# Patient Record
Sex: Male | Born: 1945 | Race: White | Hispanic: No | Marital: Single | Smoking: Former smoker
Health system: Southern US, Community
[De-identification: ages and names within clinical notes are randomized; demographics above are authoritative.]

## PROBLEM LIST (undated history)

## (undated) DIAGNOSIS — E039 Hypothyroidism, unspecified: Secondary | ICD-10-CM

## (undated) DIAGNOSIS — I495 Sick sinus syndrome: Secondary | ICD-10-CM

## (undated) DIAGNOSIS — I10 Essential (primary) hypertension: Secondary | ICD-10-CM

## (undated) DIAGNOSIS — I255 Ischemic cardiomyopathy: Secondary | ICD-10-CM

## (undated) DIAGNOSIS — C801 Malignant (primary) neoplasm, unspecified: Secondary | ICD-10-CM

## (undated) DIAGNOSIS — N400 Enlarged prostate without lower urinary tract symptoms: Secondary | ICD-10-CM

## (undated) DIAGNOSIS — I517 Cardiomegaly: Secondary | ICD-10-CM

## (undated) DIAGNOSIS — Z7982 Long term (current) use of aspirin: Secondary | ICD-10-CM

## (undated) DIAGNOSIS — E785 Hyperlipidemia, unspecified: Secondary | ICD-10-CM

## (undated) DIAGNOSIS — Z95828 Presence of other vascular implants and grafts: Secondary | ICD-10-CM

## (undated) DIAGNOSIS — M47812 Spondylosis without myelopathy or radiculopathy, cervical region: Secondary | ICD-10-CM

## (undated) DIAGNOSIS — J387 Other diseases of larynx: Secondary | ICD-10-CM

## (undated) DIAGNOSIS — I447 Left bundle-branch block, unspecified: Secondary | ICD-10-CM

## (undated) DIAGNOSIS — I2699 Other pulmonary embolism without acute cor pulmonale: Secondary | ICD-10-CM

## (undated) DIAGNOSIS — D494 Neoplasm of unspecified behavior of bladder: Secondary | ICD-10-CM

## (undated) DIAGNOSIS — K4090 Unilateral inguinal hernia, without obstruction or gangrene, not specified as recurrent: Secondary | ICD-10-CM

## (undated) DIAGNOSIS — I700 Atherosclerosis of aorta: Secondary | ICD-10-CM

## (undated) DIAGNOSIS — Z951 Presence of aortocoronary bypass graft: Secondary | ICD-10-CM

## (undated) DIAGNOSIS — I219 Acute myocardial infarction, unspecified: Secondary | ICD-10-CM

## (undated) DIAGNOSIS — I6789 Other cerebrovascular disease: Secondary | ICD-10-CM

## (undated) DIAGNOSIS — B029 Zoster without complications: Secondary | ICD-10-CM

## (undated) DIAGNOSIS — I6529 Occlusion and stenosis of unspecified carotid artery: Secondary | ICD-10-CM

## (undated) DIAGNOSIS — I2510 Atherosclerotic heart disease of native coronary artery without angina pectoris: Secondary | ICD-10-CM

## (undated) DIAGNOSIS — C01 Malignant neoplasm of base of tongue: Secondary | ICD-10-CM

## (undated) DIAGNOSIS — N200 Calculus of kidney: Secondary | ICD-10-CM

## (undated) DIAGNOSIS — K219 Gastro-esophageal reflux disease without esophagitis: Secondary | ICD-10-CM

## (undated) DIAGNOSIS — I5020 Unspecified systolic (congestive) heart failure: Secondary | ICD-10-CM

## (undated) DIAGNOSIS — K760 Fatty (change of) liver, not elsewhere classified: Secondary | ICD-10-CM

## (undated) DIAGNOSIS — Z87891 Personal history of nicotine dependence: Secondary | ICD-10-CM

## (undated) DIAGNOSIS — R42 Dizziness and giddiness: Secondary | ICD-10-CM

## (undated) DIAGNOSIS — Z7901 Long term (current) use of anticoagulants: Secondary | ICD-10-CM

## (undated) DIAGNOSIS — M1990 Unspecified osteoarthritis, unspecified site: Secondary | ICD-10-CM

## (undated) DIAGNOSIS — K8020 Calculus of gallbladder without cholecystitis without obstruction: Secondary | ICD-10-CM

## (undated) DIAGNOSIS — D696 Thrombocytopenia, unspecified: Secondary | ICD-10-CM

## (undated) DIAGNOSIS — N183 Chronic kidney disease, stage 3 unspecified: Secondary | ICD-10-CM

## (undated) DIAGNOSIS — Z9842 Cataract extraction status, left eye: Secondary | ICD-10-CM

## (undated) DIAGNOSIS — Z9841 Cataract extraction status, right eye: Secondary | ICD-10-CM

## (undated) DIAGNOSIS — K4020 Bilateral inguinal hernia, without obstruction or gangrene, not specified as recurrent: Secondary | ICD-10-CM

## (undated) HISTORY — DX: Other pulmonary embolism without acute cor pulmonale: I26.99

## (undated) HISTORY — DX: Neoplasm of unspecified behavior of bladder: D49.4

## (undated) HISTORY — DX: Left bundle-branch block, unspecified: I44.7

## (undated) HISTORY — DX: Essential (primary) hypertension: I10

## (undated) HISTORY — DX: Ischemic cardiomyopathy: I25.5

## (undated) HISTORY — DX: Malignant neoplasm of base of tongue: C01

## (undated) HISTORY — DX: Hyperlipidemia, unspecified: E78.5

## (undated) HISTORY — DX: Atherosclerotic heart disease of native coronary artery without angina pectoris: I25.10

## (undated) HISTORY — DX: Unspecified systolic (congestive) heart failure: I50.20

## (undated) HISTORY — DX: Sick sinus syndrome: I49.5

## (undated) HISTORY — PX: CARDIAC CATHETERIZATION: SHX172

## (undated) HISTORY — PX: HERNIA REPAIR: SHX51

## (undated) MED FILL — Fosaprepitant Dimeglumine For IV Infusion 150 MG (Base Eq): INTRAVENOUS | Qty: 5 | Status: AC

---
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
## 2016-02-23 DIAGNOSIS — B029 Zoster without complications: Secondary | ICD-10-CM

## 2016-02-23 HISTORY — DX: Zoster without complications: B02.9

## 2017-02-22 DIAGNOSIS — I214 Non-ST elevation (NSTEMI) myocardial infarction: Secondary | ICD-10-CM

## 2017-02-22 HISTORY — DX: Non-ST elevation (NSTEMI) myocardial infarction: I21.4

## 2017-07-23 DIAGNOSIS — I219 Acute myocardial infarction, unspecified: Secondary | ICD-10-CM

## 2017-07-23 HISTORY — DX: Acute myocardial infarction, unspecified: I21.9

## 2017-08-27 ENCOUNTER — Encounter
Admission: EM | Disposition: A | Discharge status: Other Acute Inpatient Hospital | Payer: Self-pay | Source: Home / Self Care | Attending: Emergency Medicine

## 2017-08-27 ENCOUNTER — Inpatient Hospital Stay (HOSPITAL_COMMUNITY): Payer: Medicare Other

## 2017-08-27 ENCOUNTER — Inpatient Hospital Stay (HOSPITAL_COMMUNITY): Payer: Medicare Other | Admitting: Certified Registered"

## 2017-08-27 ENCOUNTER — Inpatient Hospital Stay (HOSPITAL_COMMUNITY)
Admission: EM | Admit: 2017-08-27 | Discharge: 2017-09-02 | DRG: 235 | Disposition: A | Discharge status: Skilled Nursing Facility | Payer: Medicare Other | Source: Other Acute Inpatient Hospital | Attending: Surgery | Admitting: Surgery

## 2017-08-27 ENCOUNTER — Encounter (HOSPITAL_COMMUNITY)
Admission: EM | Disposition: A | Discharge status: Skilled Nursing Facility | Payer: Self-pay | Source: Other Acute Inpatient Hospital | Attending: Surgery

## 2017-08-27 ENCOUNTER — Emergency Department
Admission: EM | Admit: 2017-08-27 | Discharge: 2017-08-27 | Disposition: A | Discharge status: Other Acute Inpatient Hospital | Payer: Medicare Other | Attending: Emergency Medicine | Admitting: Emergency Medicine

## 2017-08-27 DIAGNOSIS — Z01811 Encounter for preprocedural respiratory examination: Secondary | ICD-10-CM

## 2017-08-27 DIAGNOSIS — I2101 ST elevation (STEMI) myocardial infarction involving left main coronary artery: Secondary | ICD-10-CM | POA: Insufficient documentation

## 2017-08-27 DIAGNOSIS — Z951 Presence of aortocoronary bypass graft: Secondary | ICD-10-CM | POA: Diagnosis not present

## 2017-08-27 DIAGNOSIS — I213 ST elevation (STEMI) myocardial infarction of unspecified site: Secondary | ICD-10-CM

## 2017-08-27 DIAGNOSIS — Y840 Cardiac catheterization as the cause of abnormal reaction of the patient, or of later complication, without mention of misadventure at the time of the procedure: Secondary | ICD-10-CM | POA: Diagnosis not present

## 2017-08-27 DIAGNOSIS — D62 Acute posthemorrhagic anemia: Secondary | ICD-10-CM | POA: Diagnosis not present

## 2017-08-27 DIAGNOSIS — I447 Left bundle-branch block, unspecified: Secondary | ICD-10-CM | POA: Diagnosis present

## 2017-08-27 DIAGNOSIS — M6281 Muscle weakness (generalized): Secondary | ICD-10-CM | POA: Diagnosis not present

## 2017-08-27 DIAGNOSIS — Z87891 Personal history of nicotine dependence: Secondary | ICD-10-CM | POA: Diagnosis not present

## 2017-08-27 DIAGNOSIS — R262 Difficulty in walking, not elsewhere classified: Secondary | ICD-10-CM | POA: Diagnosis not present

## 2017-08-27 DIAGNOSIS — I25119 Atherosclerotic heart disease of native coronary artery with unspecified angina pectoris: Secondary | ICD-10-CM | POA: Diagnosis present

## 2017-08-27 DIAGNOSIS — R7303 Prediabetes: Secondary | ICD-10-CM | POA: Diagnosis present

## 2017-08-27 DIAGNOSIS — J9811 Atelectasis: Secondary | ICD-10-CM | POA: Diagnosis not present

## 2017-08-27 DIAGNOSIS — I2510 Atherosclerotic heart disease of native coronary artery without angina pectoris: Secondary | ICD-10-CM | POA: Diagnosis present

## 2017-08-27 DIAGNOSIS — I5041 Acute combined systolic (congestive) and diastolic (congestive) heart failure: Secondary | ICD-10-CM | POA: Diagnosis present

## 2017-08-27 DIAGNOSIS — I110 Hypertensive heart disease with heart failure: Secondary | ICD-10-CM | POA: Diagnosis present

## 2017-08-27 DIAGNOSIS — Z8249 Family history of ischemic heart disease and other diseases of the circulatory system: Secondary | ICD-10-CM | POA: Insufficient documentation

## 2017-08-27 DIAGNOSIS — I10 Essential (primary) hypertension: Secondary | ICD-10-CM | POA: Diagnosis not present

## 2017-08-27 DIAGNOSIS — Z743 Need for continuous supervision: Secondary | ICD-10-CM | POA: Diagnosis not present

## 2017-08-27 DIAGNOSIS — I214 Non-ST elevation (NSTEMI) myocardial infarction: Secondary | ICD-10-CM

## 2017-08-27 DIAGNOSIS — I25110 Atherosclerotic heart disease of native coronary artery with unstable angina pectoris: Secondary | ICD-10-CM | POA: Diagnosis not present

## 2017-08-27 DIAGNOSIS — I340 Nonrheumatic mitral (valve) insufficiency: Secondary | ICD-10-CM | POA: Diagnosis not present

## 2017-08-27 DIAGNOSIS — R0602 Shortness of breath: Secondary | ICD-10-CM | POA: Diagnosis not present

## 2017-08-27 DIAGNOSIS — E569 Vitamin deficiency, unspecified: Secondary | ICD-10-CM | POA: Diagnosis not present

## 2017-08-27 DIAGNOSIS — R0789 Other chest pain: Secondary | ICD-10-CM | POA: Diagnosis not present

## 2017-08-27 DIAGNOSIS — R279 Unspecified lack of coordination: Secondary | ICD-10-CM | POA: Diagnosis not present

## 2017-08-27 DIAGNOSIS — R079 Chest pain, unspecified: Secondary | ICD-10-CM

## 2017-08-27 DIAGNOSIS — Z5189 Encounter for other specified aftercare: Secondary | ICD-10-CM | POA: Diagnosis not present

## 2017-08-27 DIAGNOSIS — D696 Thrombocytopenia, unspecified: Secondary | ICD-10-CM | POA: Diagnosis not present

## 2017-08-27 DIAGNOSIS — E785 Hyperlipidemia, unspecified: Secondary | ICD-10-CM | POA: Diagnosis not present

## 2017-08-27 HISTORY — PX: CORONARY/GRAFT ACUTE MI REVASCULARIZATION: CATH118305

## 2017-08-27 HISTORY — DX: ST elevation (STEMI) myocardial infarction of unspecified site: I21.3

## 2017-08-27 HISTORY — PX: CORONARY ARTERY BYPASS GRAFT: SHX141

## 2017-08-27 HISTORY — PX: LEFT HEART CATH AND CORONARY ANGIOGRAPHY: CATH118249

## 2017-08-27 HISTORY — DX: Presence of aortocoronary bypass graft: Z95.1

## 2017-08-27 LAB — CBC WITH DIFFERENTIAL/PLATELET
BASOS PCT: 1 %
Basophils Absolute: 0.1 10*3/uL (ref 0–0.1)
EOS PCT: 2 %
Eosinophils Absolute: 0.2 10*3/uL (ref 0–0.7)
HCT: 50.7 % (ref 40.0–52.0)
Hemoglobin: 17.2 g/dL (ref 13.0–18.0)
LYMPHS PCT: 13 %
Lymphs Abs: 1.6 10*3/uL (ref 1.0–3.6)
MCH: 30.4 pg (ref 26.0–34.0)
MCHC: 33.9 g/dL (ref 32.0–36.0)
MCV: 89.5 fL (ref 80.0–100.0)
Monocytes Absolute: 0.8 10*3/uL (ref 0.2–1.0)
Monocytes Relative: 7 %
NEUTROS ABS: 9.9 10*3/uL — AB (ref 1.4–6.5)
Neutrophils Relative %: 79 %
PLATELETS: 216 10*3/uL (ref 150–440)
RBC: 5.66 MIL/uL (ref 4.40–5.90)
RDW: 13.7 % (ref 11.5–14.5)
WBC: 12.6 10*3/uL — AB (ref 3.8–10.6)

## 2017-08-27 LAB — POCT I-STAT 3, ART BLOOD GAS (G3+)
Acid-base deficit: 3 mmol/L — ABNORMAL HIGH (ref 0.0–2.0)
Bicarbonate: 21.3 mmol/L (ref 20.0–28.0)
O2 Saturation: 95 %
PCO2 ART: 35.8 mmHg (ref 32.0–48.0)
PH ART: 7.381 (ref 7.350–7.450)
Patient temperature: 98.3
TCO2: 22 mmol/L (ref 22–32)
pO2, Arterial: 75 mmHg — ABNORMAL LOW (ref 83.0–108.0)

## 2017-08-27 LAB — PROTIME-INR
INR: 0.97
Prothrombin Time: 12.8 seconds (ref 11.4–15.2)

## 2017-08-27 LAB — LIPID PANEL
CHOL/HDL RATIO: 6 ratio
CHOLESTEROL: 197 mg/dL (ref 0–200)
HDL: 33 mg/dL — ABNORMAL LOW (ref >40)
LDL Cholesterol: 138 mg/dL — ABNORMAL HIGH (ref 0–99)
TRIGLYCERIDES: 128 mg/dL (ref <150)
VLDL: 26 mg/dL (ref 0–40)

## 2017-08-27 LAB — COMPREHENSIVE METABOLIC PANEL
ALT: 25 U/L (ref 0–44)
ANION GAP: 8 (ref 5–15)
AST: 39 U/L (ref 15–41)
Albumin: 4.2 g/dL (ref 3.5–5.0)
Alkaline Phosphatase: 58 U/L (ref 38–126)
BUN: 17 mg/dL (ref 8–23)
CHLORIDE: 111 mmol/L (ref 98–111)
CO2: 24 mmol/L (ref 22–32)
CREATININE: 1.34 mg/dL — AB (ref 0.61–1.24)
Calcium: 9 mg/dL (ref 8.9–10.3)
GFR calc non Af Amer: 52 mL/min — ABNORMAL LOW (ref >60)
Glucose, Bld: 151 mg/dL — ABNORMAL HIGH (ref 70–99)
POTASSIUM: 3.9 mmol/L (ref 3.5–5.1)
SODIUM: 143 mmol/L (ref 135–145)
Total Bilirubin: 1 mg/dL (ref 0.3–1.2)
Total Protein: 7.3 g/dL (ref 6.5–8.1)

## 2017-08-27 LAB — SURGICAL PCR SCREEN
MRSA, PCR: NEGATIVE
STAPHYLOCOCCUS AUREUS: NEGATIVE

## 2017-08-27 LAB — ABO/RH: ABO/RH(D): A POS

## 2017-08-27 LAB — TYPE AND SCREEN
ABO/RH(D): A POS
Antibody Screen: NEGATIVE

## 2017-08-27 LAB — POCT ACTIVATED CLOTTING TIME: Activated Clotting Time: 202 seconds

## 2017-08-27 LAB — APTT: aPTT: 27 seconds (ref 24–36)

## 2017-08-27 LAB — TROPONIN I: TROPONIN I: 0.6 ng/mL — AB (ref <0.03)

## 2017-08-27 SURGERY — CORONARY ARTERY BYPASS GRAFTING (CABG)
Anesthesia: General | Site: Chest

## 2017-08-27 SURGERY — CORONARY/GRAFT ACUTE MI REVASCULARIZATION
Anesthesia: Moderate Sedation

## 2017-08-27 MED ORDER — HEPARIN (PORCINE) IN NACL 2-0.9 UNITS/ML: INTRAMUSCULAR | Status: DC | PRN

## 2017-08-27 MED ORDER — FENTANYL CITRATE (PF) 100 MCG/2ML IJ SOLN
INTRAMUSCULAR | Status: AC
  Filled 2017-08-27: qty 2

## 2017-08-27 MED ORDER — SODIUM CHLORIDE 0.9 % IV SOLN
1.5000 g | INTRAVENOUS | Status: AC
  Administered 2017-08-27: 1.5 g via INTRAVENOUS
  Filled 2017-08-27: qty 1.5

## 2017-08-27 MED ORDER — MAGNESIUM SULFATE 50 % IJ SOLN: 40.0000 meq | INTRAMUSCULAR | Status: DC

## 2017-08-27 MED ORDER — MIDAZOLAM HCL 5 MG/5ML IJ SOLN
INTRAMUSCULAR | Status: DC | PRN
  Administered 2017-08-27: 5 mg via INTRAVENOUS
  Administered 2017-08-27: 3 mg via INTRAVENOUS
  Administered 2017-08-27 – 2017-08-28 (×2): 2 mg via INTRAVENOUS

## 2017-08-27 MED ORDER — HEPARIN (PORCINE) IN NACL 100-0.45 UNIT/ML-% IJ SOLN
INTRAMUSCULAR | Status: AC
  Filled 2017-08-27: qty 250

## 2017-08-27 MED ORDER — HEPARIN (PORCINE) IN NACL 1000-0.9 UT/500ML-% IV SOLN
INTRAVENOUS | Status: AC
  Filled 2017-08-27: qty 1000

## 2017-08-27 MED ORDER — EPINEPHRINE PF 1 MG/ML IJ SOLN: 0.0000 ug/min | INTRAVENOUS | Status: DC

## 2017-08-27 MED ORDER — PROPOFOL 10 MG/ML IV BOLUS
INTRAVENOUS | Status: AC
  Filled 2017-08-27: qty 20

## 2017-08-27 MED ORDER — METOPROLOL TARTRATE 5 MG/5ML IV SOLN
INTRAVENOUS | Status: DC | PRN
  Administered 2017-08-27 (×5): 1 mg via INTRAVENOUS

## 2017-08-27 MED ORDER — SODIUM CHLORIDE 0.9 % IV SOLN: INTRAVENOUS | Status: DC

## 2017-08-27 MED ORDER — POTASSIUM CHLORIDE 2 MEQ/ML IV SOLN
80.0000 meq | INTRAVENOUS | Status: DC
  Filled 2017-08-27: qty 40

## 2017-08-27 MED ORDER — HEPARIN SODIUM (PORCINE) 1000 UNIT/ML IJ SOLN
INTRAMUSCULAR | Status: AC
  Filled 2017-08-27: qty 1

## 2017-08-27 MED ORDER — HEPARIN SODIUM (PORCINE) 5000 UNIT/ML IJ SOLN: 60.0000 [IU]/kg | Freq: Once | INTRAMUSCULAR | Status: DC

## 2017-08-27 MED ORDER — DOPAMINE-DEXTROSE 3.2-5 MG/ML-% IV SOLN
0.0000 ug/kg/min | INTRAVENOUS | Status: DC
  Filled 2017-08-27: qty 250

## 2017-08-27 MED ORDER — HEPARIN (PORCINE) IN NACL 100-0.45 UNIT/ML-% IJ SOLN
INTRAMUSCULAR | Status: AC | PRN
  Administered 2017-08-27: 1000 [IU]/h via INTRAVENOUS

## 2017-08-27 MED ORDER — VANCOMYCIN HCL 10 G IV SOLR
1250.0000 mg | INTRAVENOUS | Status: AC
  Administered 2017-08-27: 1250 mg via INTRAVENOUS
  Filled 2017-08-27: qty 1250

## 2017-08-27 MED ORDER — HEPARIN SODIUM (PORCINE) 1000 UNIT/ML IJ SOLN
INTRAMUSCULAR | Status: DC | PRN
  Administered 2017-08-27: 3000 [IU] via INTRAVENOUS
  Administered 2017-08-27: 6000 [IU] via INTRAVENOUS

## 2017-08-27 MED ORDER — MIDAZOLAM HCL 2 MG/2ML IJ SOLN
INTRAMUSCULAR | Status: AC
Start: 2017-08-27 — End: ?
  Filled 2017-08-27: qty 2

## 2017-08-27 MED ORDER — THROMBIN 20000 UNITS EX SOLR
CUTANEOUS | Status: AC
  Filled 2017-08-27: qty 20000

## 2017-08-27 MED ORDER — IOPAMIDOL (ISOVUE-300) INJECTION 61%
INTRAVENOUS | Status: DC | PRN
  Administered 2017-08-27: 165 mL

## 2017-08-27 MED ORDER — FENTANYL CITRATE (PF) 250 MCG/5ML IJ SOLN
INTRAMUSCULAR | Status: DC | PRN
  Administered 2017-08-27: 750 ug via INTRAVENOUS
  Administered 2017-08-27: 100 ug via INTRAVENOUS
  Administered 2017-08-27 (×3): 50 ug via INTRAVENOUS
  Administered 2017-08-27 (×2): 250 ug via INTRAVENOUS

## 2017-08-27 MED ORDER — ROCURONIUM BROMIDE 10 MG/ML (PF) SYRINGE
PREFILLED_SYRINGE | INTRAVENOUS | Status: DC | PRN
  Administered 2017-08-27 – 2017-08-28 (×2): 100 mg via INTRAVENOUS

## 2017-08-27 MED ORDER — SODIUM CHLORIDE 0.9 % IV SOLN
30.0000 ug/min | INTRAVENOUS | Status: AC
  Administered 2017-08-27: 50 ug/min via INTRAVENOUS
  Filled 2017-08-27: qty 2

## 2017-08-27 MED ORDER — ROCURONIUM BROMIDE 10 MG/ML (PF) SYRINGE
PREFILLED_SYRINGE | INTRAVENOUS | Status: AC
  Filled 2017-08-27: qty 20

## 2017-08-27 MED ORDER — EPINEPHRINE PF 1 MG/ML IJ SOLN
0.0000 ug/min | INTRAVENOUS | Status: DC
  Filled 2017-08-27: qty 4

## 2017-08-27 MED ORDER — NITROGLYCERIN IN D5W 200-5 MCG/ML-% IV SOLN: 2.0000 ug/min | INTRAVENOUS | Status: DC

## 2017-08-27 MED ORDER — HEMOSTATIC AGENTS (NO CHARGE) OPTIME
TOPICAL | Status: DC | PRN
  Administered 2017-08-27: 1 via TOPICAL

## 2017-08-27 MED ORDER — POTASSIUM CHLORIDE 2 MEQ/ML IV SOLN: 80.0000 meq | INTRAVENOUS | Status: DC

## 2017-08-27 MED ORDER — PROPOFOL 10 MG/ML IV BOLUS
INTRAVENOUS | Status: DC | PRN
  Administered 2017-08-27: 30 mg via INTRAVENOUS
  Administered 2017-08-27: 50 mg via INTRAVENOUS
  Administered 2017-08-27: 20 mg via INTRAVENOUS

## 2017-08-27 MED ORDER — VERAPAMIL HCL 2.5 MG/ML IV SOLN
INTRAVENOUS | Status: AC
  Filled 2017-08-27: qty 2

## 2017-08-27 MED ORDER — SODIUM CHLORIDE 0.9 % IV SOLN: 750.0000 mg | INTRAVENOUS | Status: DC

## 2017-08-27 MED ORDER — DOPAMINE-DEXTROSE 3.2-5 MG/ML-% IV SOLN: 0.0000 ug/kg/min | INTRAVENOUS | Status: DC

## 2017-08-27 MED ORDER — FENTANYL CITRATE (PF) 250 MCG/5ML IJ SOLN
INTRAMUSCULAR | Status: AC
  Filled 2017-08-27: qty 20

## 2017-08-27 MED ORDER — THROMBIN 20000 UNITS EX SOLR
CUTANEOUS | Status: DC | PRN
  Administered 2017-08-27: 20000 [IU] via TOPICAL

## 2017-08-27 MED ORDER — HEPARIN SODIUM (PORCINE) 5000 UNIT/ML IJ SOLN: 4000.0000 [IU] | Freq: Once | INTRAMUSCULAR | Status: DC

## 2017-08-27 MED ORDER — MIDAZOLAM HCL 2 MG/2ML IJ SOLN
INTRAMUSCULAR | Status: DC | PRN
  Administered 2017-08-27 (×2): 1 mg via INTRAVENOUS

## 2017-08-27 MED ORDER — DEXMEDETOMIDINE HCL IN NACL 400 MCG/100ML IV SOLN
0.1000 ug/kg/h | INTRAVENOUS | Status: AC
  Administered 2017-08-27: .2 ug/kg/h via INTRAVENOUS
  Filled 2017-08-27: qty 100

## 2017-08-27 MED ORDER — NITROGLYCERIN IN D5W 200-5 MCG/ML-% IV SOLN
2.0000 ug/min | INTRAVENOUS | Status: AC
  Administered 2017-08-27: 5 ug/min via INTRAVENOUS
  Filled 2017-08-27: qty 250

## 2017-08-27 MED ORDER — LACTATED RINGERS IV SOLN
INTRAVENOUS | Status: DC | PRN
  Administered 2017-08-27 (×2): via INTRAVENOUS

## 2017-08-27 MED ORDER — TRANEXAMIC ACID 1000 MG/10ML IV SOLN
1.5000 mg/kg/h | INTRAVENOUS | Status: AC
  Administered 2017-08-27: 1.5 mg/kg/h via INTRAVENOUS
  Filled 2017-08-27: qty 25

## 2017-08-27 MED ORDER — DEXMEDETOMIDINE HCL IN NACL 400 MCG/100ML IV SOLN: 0.1000 ug/kg/h | INTRAVENOUS | Status: DC

## 2017-08-27 MED ORDER — TRANEXAMIC ACID (OHS) BOLUS VIA INFUSION
15.0000 mg/kg | INTRAVENOUS | Status: AC
Start: 2017-08-27 — End: 2017-08-27
  Administered 2017-08-27: 1564.5 mg via INTRAVENOUS
  Filled 2017-08-27: qty 1565

## 2017-08-27 MED ORDER — TRANEXAMIC ACID 1000 MG/10ML IV SOLN: 1.5000 mg/kg/h | INTRAVENOUS | Status: DC

## 2017-08-27 MED ORDER — SODIUM CHLORIDE 0.9 % IV SOLN
750.0000 mg | INTRAVENOUS | Status: AC
  Administered 2017-08-28: 750 mg via INTRAVENOUS
  Filled 2017-08-27: qty 750

## 2017-08-27 MED ORDER — SUCCINYLCHOLINE CHLORIDE 20 MG/ML IJ SOLN
INTRAMUSCULAR | Status: DC | PRN
  Administered 2017-08-27: 100 mg via INTRAVENOUS

## 2017-08-27 MED ORDER — PHENYLEPHRINE 40 MCG/ML (10ML) SYRINGE FOR IV PUSH (FOR BLOOD PRESSURE SUPPORT)
PREFILLED_SYRINGE | INTRAVENOUS | Status: AC
  Filled 2017-08-27: qty 10

## 2017-08-27 MED ORDER — TRANEXAMIC ACID (OHS) PUMP PRIME SOLUTION: 2.0000 mg/kg | INTRAVENOUS | Status: DC

## 2017-08-27 MED ORDER — PLASMA-LYTE 148 IV SOLN: INTRAVENOUS | Status: DC

## 2017-08-27 MED ORDER — TRANEXAMIC ACID (OHS) BOLUS VIA INFUSION: 15.0000 mg/kg | INTRAVENOUS | Status: DC

## 2017-08-27 MED ORDER — 0.9 % SODIUM CHLORIDE (POUR BTL) OPTIME
TOPICAL | Status: DC | PRN
  Administered 2017-08-27: 7000 mL

## 2017-08-27 MED ORDER — ARTIFICIAL TEARS OPHTHALMIC OINT
TOPICAL_OINTMENT | OPHTHALMIC | Status: DC | PRN
  Administered 2017-08-27: 1 via OPHTHALMIC

## 2017-08-27 MED ORDER — LIDOCAINE HCL (PF) 1 % IJ SOLN
INTRAMUSCULAR | Status: AC
  Filled 2017-08-27: qty 30

## 2017-08-27 MED ORDER — SODIUM CHLORIDE 0.9 % IV SOLN: 30.0000 ug/min | INTRAVENOUS | Status: DC

## 2017-08-27 MED ORDER — SUCCINYLCHOLINE CHLORIDE 200 MG/10ML IV SOSY
PREFILLED_SYRINGE | INTRAVENOUS | Status: AC
  Filled 2017-08-27: qty 10

## 2017-08-27 MED ORDER — TRANEXAMIC ACID (OHS) PUMP PRIME SOLUTION
2.0000 mg/kg | INTRAVENOUS | Status: DC
  Filled 2017-08-27: qty 2.09

## 2017-08-27 MED ORDER — NITROGLYCERIN 5 MG/ML IV SOLN
INTRAVENOUS | Status: AC
  Filled 2017-08-27: qty 10

## 2017-08-27 MED ORDER — ASPIRIN 81 MG PO CHEW: 324.0000 mg | CHEWABLE_TABLET | Freq: Once | ORAL | Status: DC

## 2017-08-27 MED ORDER — LIDOCAINE HCL (PF) 1 % IJ SOLN
INTRAMUSCULAR | Status: DC | PRN
  Administered 2017-08-27: 45 mL via SUBCUTANEOUS

## 2017-08-27 MED ORDER — FENTANYL CITRATE (PF) 250 MCG/5ML IJ SOLN
INTRAMUSCULAR | Status: AC
  Filled 2017-08-27: qty 5

## 2017-08-27 MED ORDER — SODIUM CHLORIDE 0.9 % IV SOLN
INTRAVENOUS | Status: DC
  Filled 2017-08-27: qty 30

## 2017-08-27 MED ORDER — MAGNESIUM SULFATE 50 % IJ SOLN
40.0000 meq | INTRAMUSCULAR | Status: DC
  Filled 2017-08-27: qty 9.85

## 2017-08-27 MED ORDER — SODIUM CHLORIDE 0.9 % IV SOLN
INTRAVENOUS | Status: AC
  Administered 2017-08-27: 1.2 [IU]/h via INTRAVENOUS
  Filled 2017-08-27: qty 1

## 2017-08-27 MED ORDER — FENTANYL CITRATE (PF) 250 MCG/5ML IJ SOLN
INTRAMUSCULAR | Status: AC
Start: 2017-08-27 — End: ?
  Filled 2017-08-27: qty 5

## 2017-08-27 MED ORDER — HEPARIN SODIUM (PORCINE) 1000 UNIT/ML IJ SOLN
INTRAMUSCULAR | Status: DC | PRN
  Administered 2017-08-27: 35000 [IU] via INTRAVENOUS

## 2017-08-27 MED ORDER — MIDAZOLAM HCL 10 MG/2ML IJ SOLN
INTRAMUSCULAR | Status: AC
  Filled 2017-08-27: qty 2

## 2017-08-27 MED ORDER — PAPAVERINE HCL 30 MG/ML IJ SOLN
INTRAMUSCULAR | Status: AC
  Administered 2017-08-27: 500 mL
  Filled 2017-08-27: qty 2.5

## 2017-08-27 MED ORDER — FENTANYL CITRATE (PF) 100 MCG/2ML IJ SOLN
INTRAMUSCULAR | Status: DC | PRN
  Administered 2017-08-27: 50 ug via INTRAVENOUS

## 2017-08-27 SURGICAL SUPPLY — 22 items
BALLN TREK RX 2.5X15 (BALLOONS)
BALLOON TREK RX 2.5X15 (BALLOONS) IMPLANT
CANNULA 5F STIFF (CANNULA) ×3 IMPLANT
CATH IAB 7FR 40ML (CATHETERS) ×3 IMPLANT
CATH INFINITI 5FR ANG PIGTAIL (CATHETERS) IMPLANT
CATH INFINITI JR4 5F (CATHETERS) ×3 IMPLANT
CATH LAUNCHER 6FR EBU3.5 (CATHETERS) ×3 IMPLANT
DEVICE INFLAT 30 PLUS (MISCELLANEOUS) ×3 IMPLANT
DEVICE RAD TR BAND REGULAR (VASCULAR PRODUCTS) ×3 IMPLANT
DEVICE SECURE STATLOCK IABP (MISCELLANEOUS) ×6 IMPLANT
KIT MANI 3VAL PERCEP (MISCELLANEOUS) ×3 IMPLANT
KIT TRANSPAC II SGL 4260605 (MISCELLANEOUS) ×3 IMPLANT
NEEDLE PERC 18GX7CM (NEEDLE) ×3 IMPLANT
NEEDLE PERC 21GX4CM (NEEDLE) ×3 IMPLANT
PACK CARDIAC CATH (CUSTOM PROCEDURE TRAY) ×3 IMPLANT
SHEATH AVANTI 5FR X 11CM (SHEATH) ×3 IMPLANT
SHEATH RAIN RADIAL 21G 6FR (SHEATH) ×3 IMPLANT
SUT SILK 0 FSL (SUTURE) ×3 IMPLANT
WIRE GUIDERIGHT .035X150 (WIRE) ×3 IMPLANT
WIRE HI TORQ WHISPER MS 190CM (WIRE) ×3 IMPLANT
WIRE INTUITION PROPEL ST 180CM (WIRE) ×3 IMPLANT
WIRE ROSEN-J .035X260CM (WIRE) ×3 IMPLANT

## 2017-08-27 SURGICAL SUPPLY — 101 items
BAG DECANTER FOR FLEXI CONT (MISCELLANEOUS) ×3 IMPLANT
BANDAGE ACE 4X5 VEL STRL LF (GAUZE/BANDAGES/DRESSINGS) ×3 IMPLANT
BANDAGE ACE 6X5 VEL STRL LF (GAUZE/BANDAGES/DRESSINGS) ×3 IMPLANT
BASKET HEART  (ORDER IN 25'S) (MISCELLANEOUS) ×1
BASKET HEART (ORDER IN 25'S) (MISCELLANEOUS) ×1
BASKET HEART (ORDER IN 25S) (MISCELLANEOUS) ×1 IMPLANT
BLADE STERNUM SYSTEM 6 (BLADE) ×3 IMPLANT
BLADE SURG 11 STRL SS (BLADE) ×3 IMPLANT
BNDG GAUZE ELAST 4 BULKY (GAUZE/BANDAGES/DRESSINGS) ×3 IMPLANT
CANISTER SUCT 3000ML PPV (MISCELLANEOUS) ×3 IMPLANT
CATH ROBINSON RED A/P 18FR (CATHETERS) ×6 IMPLANT
CATH THORACIC 28FR (CATHETERS) ×3 IMPLANT
CATH THORACIC 36FR (CATHETERS) ×3 IMPLANT
CATH THORACIC 36FR RT ANG (CATHETERS) ×3 IMPLANT
CLIP VESOCCLUDE MED 24/CT (CLIP) IMPLANT
CLIP VESOCCLUDE SM WIDE 24/CT (CLIP) ×3 IMPLANT
CRADLE DONUT ADULT HEAD (MISCELLANEOUS) ×3 IMPLANT
DERMABOND ADVANCED (GAUZE/BANDAGES/DRESSINGS) ×2
DERMABOND ADVANCED .7 DNX12 (GAUZE/BANDAGES/DRESSINGS) ×1 IMPLANT
DRAPE CARDIOVASCULAR INCISE (DRAPES) ×2
DRAPE SLUSH/WARMER DISC (DRAPES) ×3 IMPLANT
DRAPE SRG 135X102X78XABS (DRAPES) ×1 IMPLANT
DRSG COVADERM 4X14 (GAUZE/BANDAGES/DRESSINGS) ×3 IMPLANT
ELECT CAUTERY BLADE 6.4 (BLADE) ×3 IMPLANT
ELECT REM PT RETURN 9FT ADLT (ELECTROSURGICAL) ×6
ELECTRODE REM PT RTRN 9FT ADLT (ELECTROSURGICAL) ×2 IMPLANT
FELT TEFLON 1X6 (MISCELLANEOUS) ×6 IMPLANT
GAUZE SPONGE 4X4 12PLY STRL (GAUZE/BANDAGES/DRESSINGS) ×6 IMPLANT
GAUZE SPONGE 4X4 12PLY STRL LF (GAUZE/BANDAGES/DRESSINGS) ×6 IMPLANT
GLOVE BIO SURGEON STRL SZ 6 (GLOVE) IMPLANT
GLOVE BIO SURGEON STRL SZ 6.5 (GLOVE) IMPLANT
GLOVE BIO SURGEON STRL SZ7 (GLOVE) IMPLANT
GLOVE BIO SURGEON STRL SZ7.5 (GLOVE) IMPLANT
GLOVE BIO SURGEONS STRL SZ 6.5 (GLOVE)
GLOVE BIOGEL PI IND STRL 6 (GLOVE) IMPLANT
GLOVE BIOGEL PI IND STRL 6.5 (GLOVE) IMPLANT
GLOVE BIOGEL PI IND STRL 7.0 (GLOVE) IMPLANT
GLOVE BIOGEL PI INDICATOR 6 (GLOVE)
GLOVE BIOGEL PI INDICATOR 6.5 (GLOVE)
GLOVE BIOGEL PI INDICATOR 7.0 (GLOVE)
GLOVE EUDERMIC 7 POWDERFREE (GLOVE) ×6 IMPLANT
GLOVE ORTHO TXT STRL SZ7.5 (GLOVE) IMPLANT
GOWN STRL REUS W/ TWL LRG LVL3 (GOWN DISPOSABLE) ×4 IMPLANT
GOWN STRL REUS W/ TWL XL LVL3 (GOWN DISPOSABLE) ×1 IMPLANT
GOWN STRL REUS W/TWL LRG LVL3 (GOWN DISPOSABLE) ×8
GOWN STRL REUS W/TWL XL LVL3 (GOWN DISPOSABLE) ×2
HEMOSTAT POWDER SURGIFOAM 1G (HEMOSTASIS) ×9 IMPLANT
HEMOSTAT SURGICEL 2X14 (HEMOSTASIS) ×3 IMPLANT
INSERT FOGARTY 61MM (MISCELLANEOUS) IMPLANT
INSERT FOGARTY XLG (MISCELLANEOUS) IMPLANT
KIT BASIN OR (CUSTOM PROCEDURE TRAY) ×3 IMPLANT
KIT CATH CPB BARTLE (MISCELLANEOUS) ×3 IMPLANT
KIT SUCTION CATH 14FR (SUCTIONS) ×3 IMPLANT
KIT TURNOVER KIT B (KITS) ×3 IMPLANT
KIT VASOVIEW HEMOPRO VH 3000 (KITS) ×3 IMPLANT
NS IRRIG 1000ML POUR BTL (IV SOLUTION) ×15 IMPLANT
PACK E OPEN HEART (SUTURE) ×3 IMPLANT
PACK OPEN HEART (CUSTOM PROCEDURE TRAY) ×3 IMPLANT
PAD ARMBOARD 7.5X6 YLW CONV (MISCELLANEOUS) ×6 IMPLANT
PAD ELECT DEFIB RADIOL ZOLL (MISCELLANEOUS) ×3 IMPLANT
PENCIL BUTTON HOLSTER BLD 10FT (ELECTRODE) ×3 IMPLANT
PUNCH AORTIC ROTATE 4.0MM (MISCELLANEOUS) IMPLANT
PUNCH AORTIC ROTATE 4.5MM 8IN (MISCELLANEOUS) ×3 IMPLANT
PUNCH AORTIC ROTATE 5MM 8IN (MISCELLANEOUS) IMPLANT
SET CARDIOPLEGIA MPS 5001102 (MISCELLANEOUS) ×3 IMPLANT
SPONGE INTESTINAL PEANUT (DISPOSABLE) IMPLANT
SPONGE LAP 18X18 X RAY DECT (DISPOSABLE) IMPLANT
SPONGE LAP 4X18 RFD (DISPOSABLE) ×3 IMPLANT
SUT BONE WAX W31G (SUTURE) ×3 IMPLANT
SUT MNCRL AB 4-0 PS2 18 (SUTURE) ×6 IMPLANT
SUT PROLENE 3 0 SH DA (SUTURE) ×9 IMPLANT
SUT PROLENE 3 0 SH1 36 (SUTURE) ×3 IMPLANT
SUT PROLENE 4 0 RB 1 (SUTURE)
SUT PROLENE 4 0 SH DA (SUTURE) IMPLANT
SUT PROLENE 4-0 RB1 .5 CRCL 36 (SUTURE) IMPLANT
SUT PROLENE 5 0 C 1 36 (SUTURE) IMPLANT
SUT PROLENE 6 0 C 1 30 (SUTURE) IMPLANT
SUT PROLENE 7 0 BV 1 (SUTURE) ×3 IMPLANT
SUT PROLENE 7 0 BV1 MDA (SUTURE) ×3 IMPLANT
SUT PROLENE 8 0 BV175 6 (SUTURE) IMPLANT
SUT SILK  1 MH (SUTURE)
SUT SILK 1 MH (SUTURE) IMPLANT
SUT STEEL STERNAL CCS#1 18IN (SUTURE) IMPLANT
SUT STEEL SZ 6 DBL 3X14 BALL (SUTURE) ×9 IMPLANT
SUT VIC AB 1 CTX 36 (SUTURE) ×6
SUT VIC AB 1 CTX36XBRD ANBCTR (SUTURE) ×3 IMPLANT
SUT VIC AB 2-0 CT1 27 (SUTURE) ×4
SUT VIC AB 2-0 CT1 TAPERPNT 27 (SUTURE) ×2 IMPLANT
SUT VIC AB 2-0 CTX 27 (SUTURE) IMPLANT
SUT VIC AB 3-0 SH 27 (SUTURE)
SUT VIC AB 3-0 SH 27X BRD (SUTURE) IMPLANT
SUT VIC AB 3-0 X1 27 (SUTURE) IMPLANT
SUT VICRYL 4-0 PS2 18IN ABS (SUTURE) IMPLANT
SYSTEM SAHARA CHEST DRAIN ATS (WOUND CARE) ×3 IMPLANT
TAPE CLOTH SURG 4X10 WHT LF (GAUZE/BANDAGES/DRESSINGS) ×3 IMPLANT
TOWEL GREEN STERILE (TOWEL DISPOSABLE) ×3 IMPLANT
TOWEL GREEN STERILE FF (TOWEL DISPOSABLE) ×3 IMPLANT
TRAY FOLEY SLVR 16FR TEMP STAT (SET/KITS/TRAYS/PACK) ×3 IMPLANT
TUBING INSUFFLATION (TUBING) ×3 IMPLANT
UNDERPAD 30X30 (UNDERPADS AND DIAPERS) ×3 IMPLANT
WATER STERILE IRR 1000ML POUR (IV SOLUTION) ×6 IMPLANT

## 2017-08-27 NOTE — ED Triage Notes (Addendum)
Per EMS report, Patient c/o chest pain for 2 hours. Patient c/o 9/10 chest pain. STEMI and LBBB noted on 12 Lead. Dr. Fletcher Anon at bedside when patient arrived.  Patient was given (4) 81mg  aspirin and 3 sprays of nitro prior to arrival. Patient is alert and oriented upon arrival.

## 2017-08-27 NOTE — Consult Note (Signed)
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 Sabino Gasser is an 72 y.o. male.  HPI:   Patient is a 72 year old gentleman with a strong family history of heart disease as well as history of remote smoking who presented to [facility] this afternoon with onset of persistent substernal chest pain beginning around 1:30 PM.  He reports having episodes of intermittent chest pain over the previous few months that have occurred with and without exertion and usually the last few minutes before they resolve.  The episode today was particularly severe and did not resolve.  Was associated with nausea, some shortness of breath, and a congested feeling.  He waited a little while to see if we get better but it got worse and he became extremely weak and called EMS.  Upon arrival his blood pressure was 200/120 and he was in mild respiratory distress and mildly hypoxic.  Letter cardiogram showed left bundle branch block of unknown duration.  Patient said that his chest pain improved by time he reached [facility] and was minimal by the time he got the Cath Lab.  Catheterization showed diffuse left main and calcified disease with an 80% stenosis.  This area looked hazy.  Proximal LAD was occluded.  The ostial to proximal left circumflex had 95% stenosis.  The right coronary was diffusely diseased with 60% proximal and 90% mid vessel stenosis.  The ostium of the PDA had about 80% stenosis.  There are collaterals from the right coronary artery to the LAD.  He had a slight puff left ventriculogram which is why there is reasonable systolic function.  Left ventricular end-diastolic pressure was 17 mmHg.  The LAD was probed with 2 wires but the occlusion could not be crossed most likely because was a chronic occlusion.  He  had an intra-aortic balloon pump placed in the right common femoral artery due to his anatomy and presentation with likely congestive heart failure and pulmonary edema.  The patient was transferred to [facility] for cardiac surgical evaluation.  No past medical history on file.  He denies any history of diabetes, hypertension, or hyperlipidemia.  He said that he has not been to the doctor in years and is only gone if he has had a problem.  His only prior surgery was a right inguinal hernia repair in the past.    Family Hx: He reports a family history of heart disease.  1 of his brothers had coronary artery bypass graft surgery in his 32s and died in his 59s.  Another brother had a ventricular septal defect as a child and had 3 heart surgeries for that.  Social History: He reports a remote history of smoking but quit in around 2000.  He denies alcohol abuse.  He has lived by himself for 30 to 18 years and is unmarried with no children.  He has a farm and takes care of goats, dogs, and chickens.  Allergies: No Known Allergies  Medications:  I have reviewed the patient's current medications. Prior to Admission:  No medications prior to admission.   Scheduled: . heparin-papaverine-plasmalyte irrigation   Irrigation To OR  . magnesium sulfate  40 mEq Other To OR  . potassium chloride  80 mEq Other  To OR  . tranexamic acid  15 mg/kg Intravenous To OR  . tranexamic acid  2 mg/kg Intracatheter To OR   Continuous: . cefUROXime (ZINACEF)  IV    . cefUROXime (ZINACEF)  IV    . dexmedetomidine    . DOPamine    . epinephrine    . heparin 30,000 units/NS 1000 mL solution for CELLSAVER    . insulin (NOVOLIN-R) infusion    . nitroGLYCERIN    . phenylephrine 6m/250mL NS (0.080mml) infusion    . tranexamic acid (CYKLOKAPRON) infusion ([facility])    . vancomycin     PRN: Anti-infectives (From admission, onward)   Start     Dose/Rate Route Frequency Ordered Stop   08/28/17 0400  cefUROXime (ZINACEF)  750 mg in sodium chloride 0.9 % 100 mL IVPB  Status:  Discontinued     750 mg 200 mL/hr over 30 Minutes Intravenous To Surgery 08/27/17 2018 08/27/17 2021   08/27/17 2045  vancomycin (VANCOCIN) 1,250 mg in sodium chloride 0.9 % 250 mL IVPB     1,250 mg 166.7 mL/hr over 90 Minutes Intravenous To Surgery 08/27/17 2031 08/28/17 2045   08/27/17 2045  cefUROXime (ZINACEF) 1.5 g in sodium chloride 0.9 % 100 mL IVPB     1.5 g 200 mL/hr over 30 Minutes Intravenous To Surgery 08/27/17 2031 08/28/17 2045   08/27/17 2045  cefUROXime (ZINACEF) 750 mg in sodium chloride 0.9 % 100 mL IVPB     750 mg 200 mL/hr over 30 Minutes Intravenous To Surgery 08/27/17 2031 08/28/17 2045      Results for orders placed or performed during the hospital encounter of 08/27/17 (from the past 48 hour(s))  CBC with Differential/Platelet     Status: Abnormal   Collection Time: 08/27/17  4:42 PM  Result Value Ref Range   WBC 12.6 (H) 3.8 - 10.6 K/uL   RBC 5.66 4.40 - 5.90 MIL/uL   Hemoglobin 17.2 13.0 - 18.0 g/dL   HCT 50.7 40.0 - 52.0 %   MCV 89.5 80.0 - 100.0 fL   MCH 30.4 26.0 - 34.0 pg   MCHC 33.9 32.0 - 36.0 g/dL   RDW 13.7 11.5 - 14.5 %   Platelets 216 150 - 440 K/uL   Neutrophils Relative % 79 %   Neutro Abs 9.9 (H) 1.4 - 6.5 K/uL   Lymphocytes Relative 13 %   Lymphs Abs 1.6 1.0 - 3.6 K/uL   Monocytes Relative 7 %   Monocytes Absolute 0.8 0.2 - 1.0 K/uL   Eosinophils Relative 2 %   Eosinophils Absolute 0.2 0 - 0.7 K/uL   Basophils Relative 1 %   Basophils Absolute 0.1 0 - 0.1 K/uL    Comment: Performed at [facility] [place] [id number]-INR     Status: None   Collection Time: 08/27/17  4:42 PM  Result Value Ref Range   Prothrombin Time 12.8 11.4 - 15.2 seconds   INR 0.97     Comment: Performed at [facility] [id number]     Status: None   Collection Time: 08/27/17  4:42 PM  Result Value Ref Range   aPTT 27  24 - 36 seconds    Comment: Performed at [facility] [place] [id number] metabolic panel     Status: Abnormal   Collection Time: 08/27/17  4:42 PM  Result Value Ref Range   Sodium  143 135 - 145 mmol/L   Potassium 3.9 3.5 - 5.1 mmol/L   Chloride 111 98 - 111 mmol/L    Comment: Please note change in reference range.   CO2 24 22 - 32 mmol/L   Glucose, Bld 151 (H) 70 - 99 mg/dL    Comment: Please note change in reference range.   BUN 17 8 - 23 mg/dL    Comment: Please note change in reference range.   Creatinine, Ser 1.34 (H) 0.61 - 1.24 mg/dL   Calcium 9.0 8.9 - 10.3 mg/dL   Total Protein 7.3 6.5 - 8.1 g/dL   Albumin 4.2 3.5 - 5.0 g/dL   AST 39 15 - 41 U/L   ALT 25 0 - 44 U/L    Comment: Please note change in reference range.   Alkaline Phosphatase 58 38 - 126 U/L   Total Bilirubin 1.0 0.3 - 1.2 mg/dL   GFR calc non Af Amer 52 (L) >60 mL/min   GFR calc Af Amer >60 >60 mL/min    Comment: (NOTE) The eGFR has been calculated using the CKD EPI equation. This calculation has not been validated in all clinical situations. eGFR's persistently <60 mL/min signify possible Chronic Kidney Disease.    Anion gap 8 5 - 15    Comment: Performed at [facility], [place], [place] [address]  Troponin I     Status: Abnormal   Collection Time: 08/27/17  4:42 PM  Result Value Ref Range   Troponin I 0.60 (HH) <0.03 ng/mL    Comment: CRITICAL RESULT CALLED TO, READ BACK BY AND VERIFIED WITH ALLY WRIGHT  AT  1723 ON 08/27/17 BY SNJ. Performed at [facility], [address] [place], [place] [address]   Lipid panel     Status: Abnormal   Collection Time: 08/27/17  4:42 PM  Result Value Ref Range   Cholesterol 197 0 - 200 mg/dL   Triglycerides 128 <150 mg/dL   HDL 33 (L) >40 mg/dL   Total CHOL/HDL Ratio 6.0 RATIO   VLDL 26 0 - 40 mg/dL   LDL Cholesterol 138 (H) 0 - 99 mg/dL    Comment:        Total  Cholesterol/HDL:CHD Risk Coronary Heart Disease Risk Table                     Men   Women  1/2 Average Risk   3.4   3.3  Average Risk       5.0   4.4  2 X Average Risk   9.6   7.1  3 X Average Risk  23.4   11.0        Use the calculated Patient Ratio above and the CHD Risk Table to determine the patient's CHD Risk.        ATP [name] CLASSIFICATION (LDL):  <100     mg/dL   Optimal  100-129  mg/dL   Near or Above                    Optimal  130-159  mg/dL   Borderline  160-189  mg/dL   High  >190     mg/dL   Very High Performed at [facility], [address], [place], [place] [address]   POCT Activated clotting time     Status: None   Collection Time: 08/27/17  5:36 PM  Result Value Ref Range   Activated Clotting Time 202  seconds    No results found.  Review of Systems  Constitutional: Positive for malaise/fatigue.  HENT: Negative.   Eyes: Negative.   Respiratory: Positive for cough and shortness of breath. Negative for sputum production.   Cardiovascular: Positive for chest pain and orthopnea. Negative for leg swelling and PND.  Gastrointestinal: Positive for nausea and vomiting. Negative for abdominal pain.  Genitourinary: Negative.   Musculoskeletal: Negative.   Skin: Negative.   Neurological: Negative for dizziness and loss of consciousness.  Endo/Heme/Allergies: Negative.   Psychiatric/Behavioral: Negative.    Pulse (!) 118, resp. rate 20, SpO2 96 %. Physical Exam  Constitutional: He is oriented to person, place, and time. He appears well-developed and well-nourished. No distress.  HENT:  Head: Normocephalic and atraumatic.  Mouth/Throat: Oropharynx is clear and moist.  Poor dentition with multiple missing teeth  Eyes: Pupils are equal, round, and reactive to light. Conjunctivae and EOM are normal.  Neck: Normal range of motion. Neck supple. No JVD present. No thyromegaly present.  Cardiovascular: Normal rate, regular rhythm, normal heart sounds and  intact distal pulses.  No murmur heard. IABP right groin with some bleeding around it. No hematoma.  Respiratory: Effort normal. No respiratory distress. He has no wheezes. He has no rales.  GI: Soft. Bowel sounds are normal. He exhibits no distension and no mass. There is no tenderness.  Musculoskeletal: Normal range of motion. He exhibits no edema.  Lymphadenopathy:    He has no cervical adenopathy.  Neurological: He is alert and oriented to person, place, and time. No cranial nerve deficit.  Skin: Skin is warm and dry.  Psychiatric: He has a normal mood and affect.    Assessment/Plan:  This 72 year old gentleman has severe left main and three-vessel coronary disease presenting with an ST segment elevation MI with left bundle branch block on EKG.  His initial troponin was 0.60.  He has been having chest pain off and on for the past few months.  He is currently hemodynamically stable with no chest pain on intravenous heparin and the intra-aortic balloon pump.  There is some bleeding around the balloon pump exit site and his heparin will need to be stopped at least temporarily.  I think the best treatment for this patient is emergent coronary bypass graft surgery while he is doing well given the severity of his coronary anatomy and presentation. I discussed the operative procedure with the patient and family including alternatives, benefits and risks; including but not limited to bleeding, blood transfusion, infection, stroke, myocardial infarction, graft failure, heart block requiring a permanent pacemaker, organ dysfunction, and death.  Sharin Mons understands and agrees to proceed.     I spent 60 minutes performing this consultation and > 50% of this time was spent face to face counseling and coordinating the care of this patient's severe left main and three-vessel coronary disease with ST segment elevation MI.   Gaye Pollack 08/27/2017, 8:28 PM

## 2017-08-27 NOTE — ED Notes (Signed)
Patient put back on NRB on which he had arrived to the ED. Patient remains alert and oriented and verbal.

## 2017-08-27 NOTE — Progress Notes (Signed)
TR band still in place in route to surgery. 2 cc still present. Pt did still have some oozing from the site.

## 2017-08-27 NOTE — Anesthesia Procedure Notes (Signed)
Central Venous Catheter Insertion Performed by: Roderic Palau, MD, anesthesiologist Start/End7/07/2017 9:15 PM, 08/27/2017 9:30 PM Patient location: Pre-op. Preanesthetic checklist: patient identified, IV checked, site marked, risks and benefits discussed, surgical consent, monitors and equipment checked, pre-op evaluation, timeout performed and anesthesia consent Hand hygiene performed  and maximum sterile barriers used  PA cath was placed.Swan type:thermodilution PA Cath depth:50 Procedure performed without using ultrasound guided technique. Attempts: 1 Patient tolerated the procedure well with no immediate complications.

## 2017-08-27 NOTE — Progress Notes (Signed)
   08/27/17 1608  Clinical Encounter Type  Visited With Other (Comment) (unit staff)  Visit Type Other (Comment) (telephone response to page)   Received page from EMS that Code STEMI is en route, right after received another page regarding death in CCU.  Chaplain called ED to relay chaplain plan to check in on CCU before proceeding to ED.

## 2017-08-27 NOTE — ED Notes (Addendum)
Clothes, wallet and keys with patient to the cath lab.

## 2017-08-27 NOTE — Progress Notes (Signed)
   08/27/17 1630  Clinical Encounter Type  Visited With Patient not available;Health care provider  Visit Type Follow-up  Spiritual Encounters  Spiritual Needs Prayer   Chaplain proceeded to ED to follow up on earlier page, checked in with unit staff to confirm room number.   Chaplain maintained pastoral presence outside room as team worked, offering silent and energetic prayers for patient and care team.  Per staff and EMS, no family accompanied patient.  Team took patient to Cath Lab.

## 2017-08-27 NOTE — ED Notes (Signed)
Jamelle Haring RN, Ashely R RN, Delilah Shan RN, [place] ED tech, [place] ED tech and Theadora Rama, ED charge RN at bedside.

## 2017-08-27 NOTE — ED Provider Notes (Signed)
[facility] Emergency Department Provider Note ____________________________________________   First MD Initiated Contact with Patient 08/27/17 1644     (approximate)  I have reviewed the triage vital signs and the nursing notes.   HISTORY  Chief Complaint Chest Pain    HPI Cory Hess is a 72 y.o. male  with no known past medical history (patient states he has not been to doctor in a while and does not take any medications) presents with chest pain, acute onset 2 hours ago, pressure-like, and associated with shortness of breath.  He was given aspirin and nitroglycerin by EMS, but had no significant relief.  No prior history of symptoms like this.  EMS called the patient in as a STEMI.  No past medical history on file.  There are no active problems to display for this patient.   Prior to Admission medications   Not on File    Allergies Patient has no known allergies.  No family history on file.  Social History Social History   Tobacco Use  . Smoking status: Not on file  Substance Use Topics  . Alcohol use: Not on file  . Drug use: Not on file    Review of Systems  Constitutional: No fever. Eyes: No redness. ENT: No neck pain. Cardiovascular: Positive for chest pain. Respiratory: Positive for shortness of breath. Gastrointestinal: No vomiting. Genitourinary: Negative for flank pain.  Musculoskeletal: Negative for back pain. Skin: Negative for rash. Neurological: Negative for headache.   ____________________________________________   PHYSICAL EXAM:  VITAL SIGNS: ED Triage Vitals  Enc Vitals Group     BP      Pulse      Resp      Temp      Temp src      SpO2      Weight      Height      Head Circumference      Peak Flow      Pain Score      Pain Loc      Pain Edu?      Excl. in [place]?     Constitutional: Alert and oriented.  Uncomfortable appearing. Eyes: Conjunctivae are normal.  Head: Atraumatic. Nose: No  congestion/rhinnorhea. Mouth/Throat: Mucous membranes are moist.   Neck: Normal range of motion.  Cardiovascular: Tachycardic, regular rhythm. Grossly normal heart sounds.  Good peripheral circulation. Respiratory: Increased respiratory effort.  No retractions.  Rales bilaterally. Gastrointestinal: Soft and nontender. No distention.  Genitourinary: No flank tenderness. Musculoskeletal:  Extremities warm and well perfused.  Neurologic:  Normal speech and language. No gross focal neurologic deficits are appreciated.  Skin:  Skin is warm and dry. No rash noted. Psychiatric: Mood and affect are normal. Speech and behavior are normal.  ____________________________________________   LABS (all labs ordered are listed, but only abnormal results are displayed)  Labs Reviewed  CBC WITH DIFFERENTIAL/PLATELET  PROTIME-INR  APTT  COMPREHENSIVE METABOLIC PANEL  TROPONIN I  LIPID PANEL   ____________________________________________  EKG  ED ECG REPORT I, Arta Silence, the attending physician, personally viewed and interpreted this ECG.  Date: 08/27/2017 EKG Time: 1639 Rate: 121 Rhythm: Sinus tachycardia QRS Axis: normal Intervals: LBBB ST/T Wave abnormalities: Nonspecific Narrative Interpretation: LBBB with no prior EKG for comparison  ____________________________________________  RADIOLOGY    ____________________________________________   PROCEDURES  Procedure(s) performed: No  Procedures  Critical Care performed: Yes  CRITICAL CARE Performed by: Arta Silence   Total critical care time: 15 minutes  Critical  care time was exclusive of separately billable procedures and treating other patients.  Critical care was necessary to treat or prevent imminent or life-threatening deterioration.  Critical care was time spent personally by me on the following activities: development of treatment plan with patient and/or surrogate as well as nursing, discussions  with consultants, evaluation of patient's response to treatment, examination of patient, obtaining history from patient or surrogate, ordering and performing treatments and interventions, ordering and review of laboratory studies, ordering and review of radiographic studies, pulse oximetry and re-evaluation of patient's condition. ____________________________________________   INITIAL IMPRESSION / ASSESSMENT AND PLAN / ED COURSE  Pertinent labs & imaging results that were available during my care of the patient were reviewed by me and considered in my medical decision making (see chart for details).  72 year old male with no known significant past medical history presents with acute onset of chest pain shortness of breath 2 hours ago.  STEMI was activated by EMS due to left bundle branch block and the patient's clinical presentation.  On arrival, the patient is pale and weak appearing.  He has rales to the upper lung fields bilaterally, and is tachycardic.  EKG here confirms LBBB.  There is no prior EKG available for comparison.  Presentation is consistent with acute pulmonary edema.  Dr. Fletcher Anon met the patient with me at the bedside.  He is concern for acute MI and would like to take the patient to the Cath Lab.  Patient consents to catheterization.  Aspirin and nitro given by EMS.  Heparin given in the ED.  The patient will be taken to the Cath Lab by Dr. Fletcher Anon..   ____________________________________________   FINAL CLINICAL IMPRESSION(S) / ED DIAGNOSES  Final diagnoses:  Chest pain, unspecified type  Left bundle branch block      NEW MEDICATIONS STARTED DURING THIS VISIT:  New Prescriptions   No medications on file     Note:  This document was prepared using Dragon voice recognition software and may include unintentional dictation errors.   Arta Silence, MD 08/27/17 [phone]

## 2017-08-27 NOTE — Progress Notes (Signed)
  Echocardiogram Echocardiogram Transesophageal has been performed.  Cory Hess 08/27/2017, 9:51 PM

## 2017-08-27 NOTE — ED Notes (Signed)
Patient was given 4000 units heparin IV by Delilah Shan RN

## 2017-08-27 NOTE — Progress Notes (Addendum)
Pt has oozing from rt femoral site where balloon pump placed. Cipriano Mile, RN held pressure for 30 minutes with hemostasis. Dressing was placed per protocol. No c/o lower back pain. Bruise noted to the rt lower abdominal area. MD made aware and order to stop heparin given. Site is currently a level 0.

## 2017-08-27 NOTE — Anesthesia Procedure Notes (Signed)
Central Venous Catheter Insertion Performed by: Roderic Palau, MD, anesthesiologist Start/End7/07/2017 9:15 PM, 08/27/2017 9:30 PM Patient location: Pre-op. Preanesthetic checklist: patient identified, IV checked, site marked, risks and benefits discussed, surgical consent, monitors and equipment checked, pre-op evaluation, timeout performed and anesthesia consent Position: Trendelenburg Lidocaine 1% used for infiltration and patient sedated Hand hygiene performed , maximum sterile barriers used  and Seldinger technique used Catheter size: 9 Fr Total catheter length 10. Central line was placed.MAC introducer Procedure performed using ultrasound guided technique. Ultrasound Notes:anatomy identified, needle tip was noted to be adjacent to the nerve/plexus identified, no ultrasound evidence of intravascular and/or intraneural injection and image(s) printed for medical record Attempts: 1 Following insertion, line sutured, dressing applied and Biopatch. Post procedure assessment: blood return through all ports, free fluid flow and no air  Patient tolerated the procedure well with no immediate complications.

## 2017-08-27 NOTE — ED Notes (Signed)
Patient transported to cath lab

## 2017-08-27 NOTE — Anesthesia Procedure Notes (Signed)
Arterial Line Insertion Start/End7/07/2017 9:15 PM, 08/27/2017 9:20 PM Performed by: Roderic Palau, MD, Krist Rosenboom, Kathrin Penner, CRNA, CRNA  Preanesthetic checklist: patient identified, IV checked, risks and benefits discussed, surgical consent, monitors and equipment checked and pre-op evaluation Lidocaine 1% used for infiltration Left, radial was placed Catheter size: 20 G Hand hygiene performed  and maximum sterile barriers used   Attempts: 1 Procedure performed without using ultrasound guided technique. Ultrasound Notes:anatomy identified Following insertion, dressing applied and Biopatch.

## 2017-08-27 NOTE — Anesthesia Procedure Notes (Addendum)
Procedure Name: Intubation Date/Time: 08/27/2017 9:34 PM Performed by: Babs Bertin, CRNA Pre-anesthesia Checklist: Patient identified, Emergency Drugs available, Suction available and Patient being monitored Patient Re-evaluated:Patient Re-evaluated prior to induction Oxygen Delivery Method: Circle System Utilized Preoxygenation: Pre-oxygenation with 100% oxygen Induction Type: IV induction and Rapid sequence Laryngoscope Size: Mac and 4 Grade View: Grade I Tube type: Subglottic suction tube Tube size: 8.0 mm Number of attempts: 1 Airway Equipment and Method: Stylet and Oral airway Placement Confirmation: ETT inserted through vocal cords under direct vision,  positive ETCO2 and breath sounds checked- equal and bilateral Secured at: 22 cm Tube secured with: Tape Dental Injury: Teeth and Oropharynx as per pre-operative assessment

## 2017-08-27 NOTE — H&P (Addendum)
Cardiology Admission History and Physical:   Patient ID: [employer]elle [employer]; MRN: [id number]; DOB: [birth year]   Admission date: 08/27/2017  Primary Care Provider: No primary care provider on file. Primary Cardiologist: New   Chief Complaint: Chest pain and shortness of breath  Patient Profile:   Cory Hess is a 72 y.o. male with no prior medical history and does not take any medications who presented with acute onset of chest pain or shortness of breath.  History of Present Illness:   Cory Hess is a 72 year old male with no prior cardiac history.  He reports that he does not see a primary care physician and is not aware of any medical problems.  He does not take any medications other than a multivitamin.  He lives by himself.  He does not smoke according to him. He reports intermittent episodes of exertional chest pain over the last few months.  However, he had an acute onset of severe substernal chest pain 2 hours before presentation with severe shortness of breath.  He describes severe substernal chest tightness.  He felt extremely weak and called EMS.  Upon arrival, his blood pressure was 200/120 mmHg and the patient was in mild respiratory distress and mildly hypoxic.  Blood pressure improved with nitroglycerin and hypoxia improved with oxygen.  His EKG showed left bundle branch block of unknown chronicity given that there is no previous EKG to compare with. The patient was still having significant chest discomfort and due to that I recommended proceeding with emergent cardiac catheterization and possible PCI.   No past medical history on file.     Medications Prior to Admission: Prior to Admission medications   Not on File     Allergies:   No Known Allergies  Social History:   Social History   Socioeconomic History  . Marital status: Not on file    Spouse name: Not on file  . Number of children: Not on file  . Years of education: Not on file  . Highest education level: Not  on file  Occupational History  . Not on file  Social Needs  . Financial resource strain: Not on file  . Food insecurity:    Worry: Not on file    Inability: Not on file  . Transportation needs:    Medical: Not on file    Non-medical: Not on file  Tobacco Use  . Smoking status: Not on file  Substance and Sexual Activity  . Alcohol use: Not on file  . Drug use: Not on file  . Sexual activity: Not on file  Lifestyle  . Physical activity:    Days per week: Not on file    Minutes per session: Not on file  . Stress: Not on file  Relationships  . Social connections:    Talks on phone: Not on file    Gets together: Not on file    Attends religious service: Not on file    Active member of club or organization: Not on file    Attends meetings of clubs or organizations: Not on file    Relationship status: Not on file  . Intimate partner violence:    Fear of current or ex partner: Not on file    Emotionally abused: Not on file    Physically abused: Not on file    Forced sexual activity: Not on file  Other Topics Concern  . Not on file  Social History Narrative  . Not on file     Family Hx:  He reports a family history of heart disease.  1 of his brothers had CABG in his 61s and died in his 69s.  Another brother had a ventricular septal defect as a child and had 3 heart surgeries for that.  Social History: He reports a remote history of smoking but quit in around 2000.  He denies alcohol abuse.  He has lived by himself for 30 to 47 years and is unmarried with no children.  He has a farm and takes care of goats, dogs, and chickens.   ROS:  Please see the history of present illness.  All other ROS reviewed and negative.     Physical Exam/Data:   Vitals:   08/27/17 1638 08/27/17 1641 08/27/17 1645 08/27/17 1656  BP: 125/79 114/87 116/85   Pulse: (!) 120 (!) 123 (!) 118   Resp: (!) 23 19 (!) 27   SpO2: (!) 88% (!) 88%    Weight:    229 lb 15 oz (104.3 kg)  Height:    5\' 10"   (1.778 m)   No intake or output data in the 24 hours ending 08/27/17 1837 Filed Weights   08/27/17 1656  Weight: 229 lb 15 oz (104.3 kg)   Body mass index is 32.99 kg/m.  General:  Well nourished, well developed, in mild respiratory distress HEENT: normal Lymph: no adenopathy Neck: Mild Endocrine:  No thryomegaly Vascular: No carotid bruits; FA pulses 2+ bilaterally without bruits  Cardiac:  normal S1, S2; RRR; no murmur.  The patient is tachycardic Lungs: Bilateral rales Abd: soft, nontender, no hepatomegaly  Ext: no edema Musculoskeletal:  No deformities, BUE and BLE strength normal and equal Skin: warm and dry  Neuro:  CNs 2-12 intact, no focal abnormalities noted Psych:  Normal affect    EKG:  The ECG that was done  was personally reviewed and demonstrates sinus tachycardia with left bundle branch block  Relevant CV Studies: Emergent cardiac catheterization: 1.  Suspected ST elevation myocardial infarction with presumed new left bundle branch block.  Severe left main stenosis which appears hazy suggestive of plaque rupture with chronic occlusion of mid LAD with collaterals from the right coronary artery, severe ostial left circumflex stenosis and severe RCA stenosis. 2.  Mildly elevated left ventricular end-diastolic pressure  at 17 mmHg.   3.  The LAD was probed with 2 wires and could not cross the occlusion which favors that this is a chronic occlusion. 4.  Successful intra-aortic balloon pump placement via the right common femoral artery.  Recommendations: I recommend urgent surgical revascularization. Recommend obtaining a stat echocardiogram upon arrival or the patient can have a transesophageal echocardiogram in the OR. The patient has not been given any antiplatelet medications other than aspirin.  continue heparin drip for intra-aortic balloon pump. I disucssed the case with Dr. Cyndia Bent and Dr. Burt Knack.   Laboratory Data:  Chemistry Recent Labs  Lab  08/27/17 1642  NA 143  K 3.9  CL 111  CO2 24  GLUCOSE 151*  BUN 17  CREATININE 1.34*  CALCIUM 9.0  GFRNONAA 52*  GFRAA >60  ANIONGAP 8    Recent Labs  Lab 08/27/17 1642  PROT 7.3  ALBUMIN 4.2  AST 39  ALT 25  ALKPHOS 58  BILITOT 1.0   Hematology Recent Labs  Lab 08/27/17 1642  WBC 12.6*  RBC 5.66  HGB 17.2  HCT 50.7  MCV 89.5  MCH 30.4  MCHC 33.9  RDW 13.7  PLT 216   Cardiac Enzymes Recent  Labs  Lab 08/27/17 1642  TROPONINI 0.60*   No results for input(s): TROPIPOC in the last 168 hours.  BNPNo results for input(s): BNP, PROBNP in the last 168 hours.  DDimer No results for input(s): DDIMER in the last 168 hours.  Radiology/Studies:  No results found.  Assessment and Plan:   1. Suspected ST elevation myocardial infarction with presumed new left bundle branch block: Cardiac catheterization showed hazy 80% stenosis suggestive of plaque rupture with occluded mid LAD which turned out to be chronic with right to left collaterals, severe ostial left circumflex stenosis and a left dominant system and severe mid RCA disease.  Based on his coronary anatomy, I recommend urgent surgical revascularization.  An intra-aortic balloon pump was placed with resolution of chest pain. Continue heparin drip.   2.  Hypoxia and respiratory failure: Likely due to suspected early pulmonary edema.  Recommend obtaining a chest x-ray upon arrival as well as a stat echocardiogram. LVEDP was only 17 mmHg.    For questions or updates, please contact [place] Please consult [email or web address] for contact info under Cardiology/STEMI.    Signed, Kathlyn Sacramento, MD  08/27/2017 6:37 PM

## 2017-08-27 NOTE — Anesthesia Preprocedure Evaluation (Addendum)
Anesthesia Evaluation  Patient identified by MRN, date of birth, ID band Patient awake    Reviewed: Allergy & Precautions, H&P , NPO status , Patient's Chart, lab work & pertinent test results  Airway Mallampati: II  TM Distance: >3 FB Neck ROM: Full    Dental no notable dental hx. (+) Partial Lower, Partial Upper, Dental Advisory Given   Pulmonary neg pulmonary ROS,    Pulmonary exam normal breath sounds clear to auscultation       Cardiovascular + angina + CAD and + Past MI   Rhythm:Regular Rate:Normal     Neuro/Psych negative neurological ROS  negative psych ROS   GI/Hepatic negative GI ROS, Neg liver ROS,   Endo/Other  negative endocrine ROS  Renal/GU negative Renal ROS  negative genitourinary   Musculoskeletal   Abdominal   Peds  Hematology negative hematology ROS (+)   Anesthesia Other Findings   Reproductive/Obstetrics negative OB ROS                           Anesthesia Physical Anesthesia Plan  ASA: IV and emergent  Anesthesia Plan: General   Post-op Pain Management:    Induction: Intravenous  PONV Risk Score and Plan: 2 and Midazolam and Treatment may vary due to age or medical condition  Airway Management Planned: Oral ETT  Additional Equipment: Arterial line, CVP, PA Cath, TEE and Ultrasound Guidance Line Placement  Intra-op Plan:   Post-operative Plan: Post-operative intubation/ventilation  Informed Consent: I have reviewed the patients History and Physical, chart, labs and discussed the procedure including the risks, benefits and alternatives for the proposed anesthesia with the patient or authorized representative who has indicated his/her understanding and acceptance.   Dental advisory given  Plan Discussed with: CRNA  Anesthesia Plan Comments:        Anesthesia Quick Evaluation

## 2017-08-28 ENCOUNTER — Encounter (HOSPITAL_COMMUNITY): Payer: Self-pay | Admitting: *Deleted

## 2017-08-28 ENCOUNTER — Inpatient Hospital Stay (HOSPITAL_COMMUNITY): Payer: Medicare Other

## 2017-08-28 ENCOUNTER — Other Ambulatory Visit: Payer: Self-pay

## 2017-08-28 DIAGNOSIS — Z951 Presence of aortocoronary bypass graft: Secondary | ICD-10-CM

## 2017-08-28 DIAGNOSIS — I2510 Atherosclerotic heart disease of native coronary artery without angina pectoris: Secondary | ICD-10-CM | POA: Diagnosis present

## 2017-08-28 DIAGNOSIS — I25700 Atherosclerosis of coronary artery bypass graft(s), unspecified, with unstable angina pectoris: Secondary | ICD-10-CM

## 2017-08-28 LAB — GLUCOSE, CAPILLARY
GLUCOSE-CAPILLARY: 114 mg/dL — AB (ref 70–99)
GLUCOSE-CAPILLARY: 119 mg/dL — AB (ref 70–99)
GLUCOSE-CAPILLARY: 122 mg/dL — AB (ref 70–99)
GLUCOSE-CAPILLARY: 123 mg/dL — AB (ref 70–99)
GLUCOSE-CAPILLARY: 113 mg/dL — AB (ref 70–99)
Glucose-Capillary: 117 mg/dL — ABNORMAL HIGH (ref 70–99)
Glucose-Capillary: 124 mg/dL — ABNORMAL HIGH (ref 70–99)
Glucose-Capillary: 102 mg/dL — ABNORMAL HIGH (ref 70–99)
Glucose-Capillary: 125 mg/dL — ABNORMAL HIGH (ref 70–99)
Glucose-Capillary: 132 mg/dL — ABNORMAL HIGH (ref 70–99)

## 2017-08-28 LAB — CREATININE, SERUM
CREATININE: 1.16 mg/dL (ref 0.61–1.24)
CREATININE: 1.2 mg/dL (ref 0.61–1.24)
GFR calc Af Amer: >60 mL/min (ref >60)
GFR, EST NON AFRICAN AMERICAN: 60 mL/min — AB (ref >60)

## 2017-08-28 LAB — BASIC METABOLIC PANEL
ANION GAP: 4 — AB (ref 5–15)
BUN: 14 mg/dL (ref 8–23)
CO2: 24 mmol/L (ref 22–32)
Calcium: 8.5 mg/dL — ABNORMAL LOW (ref 8.9–10.3)
Chloride: 114 mmol/L — ABNORMAL HIGH (ref 98–111)
Creatinine, Ser: 1.24 mg/dL (ref 0.61–1.24)
GFR calc Af Amer: >60 mL/min (ref >60)
GFR calc non Af Amer: 57 mL/min — ABNORMAL LOW (ref >60)
Glucose, Bld: 139 mg/dL — ABNORMAL HIGH (ref 70–99)
POTASSIUM: 4.6 mmol/L (ref 3.5–5.1)
Sodium: 142 mmol/L (ref 135–145)

## 2017-08-28 LAB — POCT I-STAT, CHEM 8
BUN: 18 mg/dL (ref 8–23)
BUN: 18 mg/dL (ref 8–23)
CALCIUM ION: 1.2 mmol/L (ref 1.15–1.40)
CHLORIDE: 106 mmol/L (ref 98–111)
CREATININE: 1 mg/dL (ref 0.61–1.24)
CREATININE: 1.1 mg/dL (ref 0.61–1.24)
Calcium, Ion: 1.28 mmol/L (ref 1.15–1.40)
Chloride: 109 mmol/L (ref 98–111)
GLUCOSE: 119 mg/dL — AB (ref 70–99)
Glucose, Bld: 127 mg/dL — ABNORMAL HIGH (ref 70–99)
HCT: 35 % — ABNORMAL LOW (ref 39.0–52.0)
HEMATOCRIT: 36 % — AB (ref 39.0–52.0)
HEMOGLOBIN: 12.2 g/dL — AB (ref 13.0–17.0)
Hemoglobin: 11.9 g/dL — ABNORMAL LOW (ref 13.0–17.0)
POTASSIUM: 4.4 mmol/L (ref 3.5–5.1)
Potassium: 4.3 mmol/L (ref 3.5–5.1)
SODIUM: 144 mmol/L (ref 135–145)
SODIUM: 142 mmol/L (ref 135–145)
TCO2: 21 mmol/L — AB (ref 22–32)
TCO2: 23 mmol/L (ref 22–32)

## 2017-08-28 LAB — PROTIME-INR
INR: 1.34
Prothrombin Time: 16.5 seconds — ABNORMAL HIGH (ref 11.4–15.2)

## 2017-08-28 LAB — POCT I-STAT 3, ART BLOOD GAS (G3+)
ACID-BASE DEFICIT: 5 mmol/L — AB (ref 0.0–2.0)
ACID-BASE DEFICIT: 5 mmol/L — AB (ref 0.0–2.0)
Acid-base deficit: 4 mmol/L — ABNORMAL HIGH (ref 0.0–2.0)
BICARBONATE: 20.3 mmol/L (ref 20.0–28.0)
Bicarbonate: 22.2 mmol/L (ref 20.0–28.0)
Bicarbonate: 21.4 mmol/L (ref 20.0–28.0)
O2 Saturation: 97 %
O2 Saturation: 94 %
O2 Saturation: 96 %
PH ART: 7.36 (ref 7.350–7.450)
PH ART: 7.325 — AB (ref 7.350–7.450)
TCO2: 24 mmol/L (ref 22–32)
TCO2: 21 mmol/L — ABNORMAL LOW (ref 22–32)
TCO2: 23 mmol/L (ref 22–32)
pCO2 arterial: 46 mmHg (ref 32.0–48.0)
pCO2 arterial: 36.1 mmHg (ref 32.0–48.0)
pCO2 arterial: 41.1 mmHg (ref 32.0–48.0)
pH, Arterial: 7.29 — ABNORMAL LOW (ref 7.350–7.450)
pO2, Arterial: 96 mmHg (ref 83.0–108.0)
pO2, Arterial: 75 mmHg — ABNORMAL LOW (ref 83.0–108.0)
pO2, Arterial: 87 mmHg (ref 83.0–108.0)

## 2017-08-28 LAB — POCT I-STAT 4, (NA,K, GLUC, HGB,HCT)
GLUCOSE: 131 mg/dL — AB (ref 70–99)
HEMATOCRIT: 39 % (ref 39.0–52.0)
HEMOGLOBIN: 13.3 g/dL (ref 13.0–17.0)
POTASSIUM: 4.5 mmol/L (ref 3.5–5.1)
SODIUM: 142 mmol/L (ref 135–145)

## 2017-08-28 LAB — CBC
HCT: 42 % (ref 39.0–52.0)
HCT: 38 % — ABNORMAL LOW (ref 39.0–52.0)
HEMATOCRIT: 39.9 % (ref 39.0–52.0)
HEMOGLOBIN: 12.3 g/dL — AB (ref 13.0–17.0)
Hemoglobin: 13.9 g/dL (ref 13.0–17.0)
Hemoglobin: 13 g/dL (ref 13.0–17.0)
MCH: 30 pg (ref 26.0–34.0)
MCH: 29.9 pg (ref 26.0–34.0)
MCH: 29.6 pg (ref 26.0–34.0)
MCHC: 33.1 g/dL (ref 30.0–36.0)
MCHC: 32.6 g/dL (ref 30.0–36.0)
MCHC: 32.4 g/dL (ref 30.0–36.0)
MCV: 90.7 fL (ref 78.0–100.0)
MCV: 91.7 fL (ref 78.0–100.0)
MCV: 91.6 fL (ref 78.0–100.0)
PLATELETS: 121 10*3/uL — AB (ref 150–400)
Platelets: 145 10*3/uL — ABNORMAL LOW (ref 150–400)
Platelets: 144 10*3/uL — ABNORMAL LOW (ref 150–400)
RBC: 4.63 MIL/uL (ref 4.22–5.81)
RBC: 4.35 MIL/uL (ref 4.22–5.81)
RBC: 4.15 MIL/uL — AB (ref 4.22–5.81)
RDW: 12.8 % (ref 11.5–15.5)
RDW: 13.1 % (ref 11.5–15.5)
RDW: 13.2 % (ref 11.5–15.5)
WBC: 20.7 10*3/uL — AB (ref 4.0–10.5)
WBC: 19.1 10*3/uL — AB (ref 4.0–10.5)
WBC: 16.4 10*3/uL — ABNORMAL HIGH (ref 4.0–10.5)

## 2017-08-28 LAB — HEMOGLOBIN AND HEMATOCRIT, BLOOD
HCT: 34.3 % — ABNORMAL LOW (ref 39.0–52.0)
HEMOGLOBIN: 11.2 g/dL — AB (ref 13.0–17.0)

## 2017-08-28 LAB — APTT: APTT: 33 s (ref 24–36)

## 2017-08-28 LAB — PLATELET COUNT: Platelets: 160 10*3/uL (ref 150–400)

## 2017-08-28 LAB — POCT ACTIVATED CLOTTING TIME: Activated Clotting Time: 125 seconds

## 2017-08-28 LAB — ECHO INTRAOPERATIVE TEE
HEIGHTINCHES: 70 in
Weight: 3679.04 oz

## 2017-08-28 LAB — HEMOGLOBIN A1C
Hgb A1c MFr Bld: 5.6 % (ref 4.8–5.6)
MEAN PLASMA GLUCOSE: 114.02 mg/dL

## 2017-08-28 LAB — MAGNESIUM
MAGNESIUM: 2.3 mg/dL (ref 1.7–2.4)
Magnesium: 2.3 mg/dL (ref 1.7–2.4)
Magnesium: 3.1 mg/dL — ABNORMAL HIGH (ref 1.7–2.4)

## 2017-08-28 MED ORDER — SODIUM CHLORIDE 0.9% FLUSH
3.0000 mL | Freq: Two times a day (BID) | INTRAVENOUS | Status: DC
  Administered 2017-08-29 (×2): 3 mL via INTRAVENOUS

## 2017-08-28 MED ORDER — ASPIRIN 81 MG PO CHEW: 324.0000 mg | CHEWABLE_TABLET | Freq: Every day | ORAL | Status: DC

## 2017-08-28 MED ORDER — KETOROLAC TROMETHAMINE 15 MG/ML IJ SOLN
15.0000 mg | Freq: Four times a day (QID) | INTRAMUSCULAR | Status: AC | PRN
  Administered 2017-08-28 – 2017-08-29 (×4): 15 mg via INTRAVENOUS
  Filled 2017-08-28 (×4): qty 1

## 2017-08-28 MED ORDER — ATORVASTATIN CALCIUM 20 MG PO TABS
20.0000 mg | ORAL_TABLET | Freq: Every day | ORAL | Status: DC
Start: 2017-08-29 — End: 2017-09-02
  Administered 2017-08-29 – 2017-09-02 (×4): 20 mg via ORAL
  Filled 2017-08-28 (×4): qty 1

## 2017-08-28 MED ORDER — PHENYLEPHRINE HCL-NACL 20-0.9 MG/250ML-% IV SOLN
0.0000 ug/min | INTRAVENOUS | Status: DC
  Administered 2017-08-28: 20 ug/min via INTRAVENOUS
  Filled 2017-08-28 (×3): qty 250

## 2017-08-28 MED ORDER — SODIUM CHLORIDE 0.9 % IV SOLN
INTRAVENOUS | Status: DC
  Filled 2017-08-28: qty 1

## 2017-08-28 MED ORDER — FAMOTIDINE IN NACL 20-0.9 MG/50ML-% IV SOLN
20.0000 mg | Freq: Two times a day (BID) | INTRAVENOUS | Status: AC
  Administered 2017-08-28: 20 mg via INTRAVENOUS

## 2017-08-28 MED ORDER — FUROSEMIDE 10 MG/ML IJ SOLN
INTRAMUSCULAR | Status: AC
  Filled 2017-08-28: qty 4

## 2017-08-28 MED ORDER — MORPHINE SULFATE (PF) 2 MG/ML IV SOLN
2.0000 mg | INTRAVENOUS | Status: DC | PRN
  Administered 2017-08-28 (×2): 4 mg via INTRAVENOUS
  Administered 2017-08-29 (×2): 2 mg via INTRAVENOUS
  Filled 2017-08-28: qty 2
  Filled 2017-08-28 (×2): qty 1
  Filled 2017-08-28: qty 2

## 2017-08-28 MED ORDER — PANTOPRAZOLE SODIUM 40 MG PO TBEC: 40.0000 mg | DELAYED_RELEASE_TABLET | Freq: Every day | ORAL | Status: DC

## 2017-08-28 MED ORDER — ACETAMINOPHEN 650 MG RE SUPP
650.0000 mg | Freq: Once | RECTAL | Status: AC
  Administered 2017-08-28: 650 mg via RECTAL

## 2017-08-28 MED ORDER — MORPHINE SULFATE (PF) 2 MG/ML IV SOLN
1.0000 mg | INTRAVENOUS | Status: AC | PRN
  Administered 2017-08-28: 4 mg via INTRAVENOUS
  Filled 2017-08-28: qty 2

## 2017-08-28 MED ORDER — SODIUM CHLORIDE 0.9% FLUSH: 10.0000 mL | INTRAVENOUS | Status: DC | PRN

## 2017-08-28 MED ORDER — METOPROLOL TARTRATE 5 MG/5ML IV SOLN: 2.5000 mg | INTRAVENOUS | Status: DC | PRN

## 2017-08-28 MED ORDER — INSULIN REGULAR BOLUS VIA INFUSION
0.0000 [IU] | Freq: Three times a day (TID) | INTRAVENOUS | Status: DC
  Filled 2017-08-28: qty 10

## 2017-08-28 MED ORDER — OXYCODONE HCL 5 MG PO TABS
10.0000 mg | ORAL_TABLET | ORAL | Status: DC | PRN
  Administered 2017-08-28 – 2017-08-30 (×5): 10 mg via ORAL
  Filled 2017-08-28 (×5): qty 2

## 2017-08-28 MED ORDER — ONDANSETRON HCL 4 MG/2ML IJ SOLN
4.0000 mg | Freq: Four times a day (QID) | INTRAMUSCULAR | Status: DC | PRN
  Administered 2017-08-28 – 2017-08-29 (×2): 4 mg via INTRAVENOUS
  Filled 2017-08-28 (×2): qty 2

## 2017-08-28 MED ORDER — ORAL CARE MOUTH RINSE
15.0000 mL | OROMUCOSAL | Status: DC
  Administered 2017-08-28 (×2): 15 mL via OROMUCOSAL

## 2017-08-28 MED ORDER — BISACODYL 10 MG RE SUPP: 10.0000 mg | Freq: Every day | RECTAL | Status: DC

## 2017-08-28 MED ORDER — SODIUM CHLORIDE 0.9 % IV SOLN
INTRAVENOUS | Status: DC
  Administered 2017-08-28: 03:00:00 via INTRAVENOUS

## 2017-08-28 MED ORDER — DOPAMINE-DEXTROSE 1.6-5 MG/ML-% IV SOLN
INTRAVENOUS | Status: DC | PRN
  Administered 2017-08-28: 5 ug/kg/min via INTRAVENOUS

## 2017-08-28 MED ORDER — METOPROLOL TARTRATE 12.5 MG HALF TABLET: 12.5000 mg | ORAL_TABLET | Freq: Two times a day (BID) | ORAL | Status: DC

## 2017-08-28 MED ORDER — MIDAZOLAM HCL 2 MG/2ML IJ SOLN: 2.0000 mg | INTRAMUSCULAR | Status: DC | PRN

## 2017-08-28 MED ORDER — ASPIRIN EC 325 MG PO TBEC
325.0000 mg | DELAYED_RELEASE_TABLET | Freq: Every day | ORAL | Status: DC
  Administered 2017-08-29: 325 mg via ORAL
  Filled 2017-08-28: qty 1

## 2017-08-28 MED ORDER — MAGNESIUM SULFATE 4 GM/100ML IV SOLN
4.0000 g | Freq: Once | INTRAVENOUS | Status: AC
  Administered 2017-08-28: 4 g via INTRAVENOUS
  Filled 2017-08-28: qty 100

## 2017-08-28 MED ORDER — PROTAMINE SULFATE 10 MG/ML IV SOLN
INTRAVENOUS | Status: DC | PRN
  Administered 2017-08-28: 300 mg via INTRAVENOUS

## 2017-08-28 MED ORDER — DOPAMINE-DEXTROSE 3.2-5 MG/ML-% IV SOLN: 2.0000 ug/kg/min | INTRAVENOUS | Status: DC

## 2017-08-28 MED ORDER — ACETAMINOPHEN 500 MG PO TABS
1000.0000 mg | ORAL_TABLET | Freq: Four times a day (QID) | ORAL | Status: DC
  Administered 2017-08-28 – 2017-08-30 (×5): 1000 mg via ORAL
  Filled 2017-08-28 (×5): qty 2

## 2017-08-28 MED ORDER — CALCIUM CHLORIDE 10 % IV SOLN
INTRAVENOUS | Status: DC | PRN
  Administered 2017-08-28: 1 g via INTRAVENOUS

## 2017-08-28 MED ORDER — DEXMEDETOMIDINE HCL IN NACL 200 MCG/50ML IV SOLN: 0.0000 ug/kg/h | INTRAVENOUS | Status: DC

## 2017-08-28 MED ORDER — PHENYLEPHRINE HCL-NACL 20-0.9 MG/250ML-% IV SOLN: 0.0000 ug/min | INTRAVENOUS | Status: DC

## 2017-08-28 MED ORDER — ORAL CARE MOUTH RINSE
15.0000 mL | Freq: Two times a day (BID) | OROMUCOSAL | Status: DC
  Administered 2017-08-28 – 2017-09-02 (×9): 15 mL via OROMUCOSAL

## 2017-08-28 MED ORDER — CHLORHEXIDINE GLUCONATE CLOTH 2 % EX PADS
6.0000 | MEDICATED_PAD | Freq: Every day | CUTANEOUS | Status: DC
  Administered 2017-08-29: 6 via TOPICAL

## 2017-08-28 MED ORDER — SODIUM CHLORIDE 0.9 % IV SOLN
1.5000 g | Freq: Two times a day (BID) | INTRAVENOUS | Status: AC
  Administered 2017-08-28 – 2017-08-29 (×4): 1.5 g via INTRAVENOUS
  Filled 2017-08-28 (×4): qty 1.5

## 2017-08-28 MED ORDER — MIDAZOLAM HCL 2 MG/2ML IJ SOLN
INTRAMUSCULAR | Status: AC
  Filled 2017-08-28: qty 2

## 2017-08-28 MED ORDER — SODIUM CHLORIDE 0.9 % IV SOLN: 250.0000 mL | INTRAVENOUS | Status: DC

## 2017-08-28 MED ORDER — BISACODYL 5 MG PO TBEC
10.0000 mg | DELAYED_RELEASE_TABLET | Freq: Every day | ORAL | Status: DC
  Administered 2017-08-29: 10 mg via ORAL
  Filled 2017-08-28: qty 2

## 2017-08-28 MED ORDER — CHLORHEXIDINE GLUCONATE 0.12 % MT SOLN
15.0000 mL | OROMUCOSAL | Status: AC
  Administered 2017-08-28: 15 mL via OROMUCOSAL

## 2017-08-28 MED ORDER — ACETAMINOPHEN 160 MG/5ML PO SOLN: 650.0000 mg | Freq: Once | ORAL | Status: AC

## 2017-08-28 MED ORDER — SODIUM CHLORIDE 0.45 % IV SOLN
INTRAVENOUS | Status: DC | PRN
  Administered 2017-08-28: 03:00:00 via INTRAVENOUS

## 2017-08-28 MED ORDER — METOPROLOL TARTRATE 25 MG/10 ML ORAL SUSPENSION: 12.5000 mg | Freq: Two times a day (BID) | ORAL | Status: DC

## 2017-08-28 MED ORDER — DOCUSATE SODIUM 100 MG PO CAPS
200.0000 mg | ORAL_CAPSULE | Freq: Every day | ORAL | Status: DC
  Administered 2017-08-29: 200 mg via ORAL
  Filled 2017-08-28: qty 2

## 2017-08-28 MED ORDER — DEXMEDETOMIDINE HCL IN NACL 400 MCG/100ML IV SOLN: 0.0000 ug/kg/h | INTRAVENOUS | Status: DC

## 2017-08-28 MED ORDER — TRAMADOL HCL 50 MG PO TABS
50.0000 mg | ORAL_TABLET | ORAL | Status: DC | PRN
  Administered 2017-08-30: 50 mg via ORAL
  Filled 2017-08-28: qty 1

## 2017-08-28 MED ORDER — FUROSEMIDE 10 MG/ML IJ SOLN
40.0000 mg | Freq: Once | INTRAMUSCULAR | Status: AC
  Administered 2017-08-28: 40 mg via INTRAVENOUS
  Filled 2017-08-28: qty 4

## 2017-08-28 MED ORDER — LACTATED RINGERS IV SOLN
500.0000 mL | Freq: Once | INTRAVENOUS | Status: AC | PRN
  Administered 2017-08-28: 500 mL via INTRAVENOUS

## 2017-08-28 MED ORDER — POTASSIUM CHLORIDE 10 MEQ/50ML IV SOLN: 10.0000 meq | INTRAVENOUS | Status: AC

## 2017-08-28 MED ORDER — METOPROLOL TARTRATE 12.5 MG HALF TABLET
12.5000 mg | ORAL_TABLET | Freq: Two times a day (BID) | ORAL | Status: DC
  Administered 2017-08-28 – 2017-08-29 (×2): 12.5 mg via ORAL
  Filled 2017-08-28 (×2): qty 1

## 2017-08-28 MED ORDER — SODIUM CHLORIDE 0.9% FLUSH
10.0000 mL | Freq: Two times a day (BID) | INTRAVENOUS | Status: DC
  Administered 2017-08-28 – 2017-08-29 (×4): 10 mL

## 2017-08-28 MED ORDER — NITROGLYCERIN IN D5W 200-5 MCG/ML-% IV SOLN: 0.0000 ug/min | INTRAVENOUS | Status: DC

## 2017-08-28 MED ORDER — MIDAZOLAM HCL 10 MG/2ML IJ SOLN
INTRAMUSCULAR | Status: AC
  Filled 2017-08-28: qty 2

## 2017-08-28 MED ORDER — LACTATED RINGERS IV SOLN
INTRAVENOUS | Status: DC
  Administered 2017-08-28 – 2017-08-30 (×2): via INTRAVENOUS

## 2017-08-28 MED ORDER — LACTATED RINGERS IV SOLN
INTRAVENOUS | Status: DC
  Administered 2017-08-28: 02:00:00 via INTRAVENOUS

## 2017-08-28 MED ORDER — VANCOMYCIN HCL IN DEXTROSE 1-5 GM/200ML-% IV SOLN
1000.0000 mg | Freq: Once | INTRAVENOUS | Status: AC
  Administered 2017-08-28: 1000 mg via INTRAVENOUS
  Filled 2017-08-28: qty 200

## 2017-08-28 MED ORDER — INSULIN ASPART 100 UNIT/ML ~~LOC~~ SOLN
0.0000 [IU] | SUBCUTANEOUS | Status: DC
  Administered 2017-08-28: 2 [IU] via SUBCUTANEOUS

## 2017-08-28 MED ORDER — OXYCODONE HCL 5 MG PO TABS
5.0000 mg | ORAL_TABLET | ORAL | Status: DC | PRN
  Administered 2017-08-28 (×3): 5 mg via ORAL
  Filled 2017-08-28 (×3): qty 1

## 2017-08-28 MED ORDER — ACETAMINOPHEN 160 MG/5ML PO SOLN
1000.0000 mg | Freq: Four times a day (QID) | ORAL | Status: DC
  Administered 2017-08-29: 1000 mg
  Filled 2017-08-28: qty 40.6

## 2017-08-28 MED ORDER — SODIUM CHLORIDE 0.9% FLUSH: 3.0000 mL | INTRAVENOUS | Status: DC | PRN

## 2017-08-28 MED ORDER — CHLORHEXIDINE GLUCONATE 0.12% ORAL RINSE (MEDLINE KIT): 15.0000 mL | Freq: Two times a day (BID) | OROMUCOSAL | Status: DC

## 2017-08-28 MED ORDER — ALBUMIN HUMAN 5 % IV SOLN
250.0000 mL | INTRAVENOUS | Status: DC | PRN
  Administered 2017-08-28 (×3): 250 mL via INTRAVENOUS
  Filled 2017-08-28: qty 250

## 2017-08-28 NOTE — Plan of Care (Signed)
Has frequent panic attacks where he gets SOB and coughs violently. Pt encouraged to take deep breaths and breathe in oxygen through his nose,  instead he grunts. Frequent medication for pain. Will continue to monitor.

## 2017-08-28 NOTE — Transfer of Care (Signed)
Immediate Anesthesia Transfer of Care Note  Patient: Sharin Mons  Procedure(s) Performed: CORONARY ARTERY BYPASS GRAFTING (CABG) ON PUMP USING LEFT INTERNAL MAMMARY ARTERY AND LEFT GREATER SAPHENOUS VEIN VIA ENDOVEIN HARVEST (N/A Chest)  Patient Location: SICU  Anesthesia Type:General  Level of Consciousness: Patient remains intubated per anesthesia plan  Airway & Oxygen Therapy: Patient remains intubated per anesthesia plan and Patient placed on Ventilator (see vital sign flow sheet for setting)  Post-op Assessment: Report given to RN and Post -op Vital signs reviewed and stable  Post vital signs: Reviewed and stable  Last Vitals:  Vitals Value Taken Time  BP    Temp    Pulse    Resp    SpO2      Last Pain:  Vitals:   08/27/17 2000  PainSc: 0-No pain         Complications: No apparent anesthesia complications

## 2017-08-28 NOTE — Progress Notes (Signed)
Patient ID: Cory Hess, male   DOB: 1945-03-15, 72 y.o.   MRN: [id number] [facility] Evening Rounds:  Hemodynamically stable in sinus rhythm 105. Will start Lopressor.  Urine output ok. Will start Lasix  Having a lot of chest wall pain. Continue oxy and Toradol.  BMET    Component Value Date/Time   NA 142 08/28/2017 1516   K 4.3 08/28/2017 1516   CL 106 08/28/2017 1516   CO2 24 08/28/2017 0227   GLUCOSE 119 (H) 08/28/2017 1516   BUN 18 08/28/2017 1516   CREATININE 1.10 08/28/2017 1516   CALCIUM 8.5 (L) 08/28/2017 0227   GFRNONAA 60 (L) 08/28/2017 1507   GFRAA >60 08/28/2017 1507   CBC    Component Value Date/Time   WBC 16.4 (H) 08/28/2017 1507   RBC 4.15 (L) 08/28/2017 1507   HGB 11.9 (L) 08/28/2017 1516   HCT 35.0 (L) 08/28/2017 1516   PLT 121 (L) 08/28/2017 1507   MCV 91.6 08/28/2017 1507   MCH 29.6 08/28/2017 1507   MCHC 32.4 08/28/2017 1507   RDW 13.2 08/28/2017 1507   LYMPHSABS 1.6 08/27/2017 1642   MONOABS 0.8 08/27/2017 1642   EOSABS 0.2 08/27/2017 1642   BASOSABS 0.1 08/27/2017 1642

## 2017-08-28 NOTE — Procedures (Signed)
Extubation Procedure Note  Patient Details:   Name: Cory Hess DOB: Jul 21, 1945 MRN: [id number]   Airway Documentation:  Airway 8 mm (Active)  Secured at (cm) 22 cm 08/28/2017  4:01 AM  Measured From Lips 08/28/2017  4:01 AM  Secured Location Right 08/28/2017  4:01 AM  Secured By Rana Snare Tape 08/28/2017  4:01 AM  Cuff Pressure (cm H2O) 24 cm H2O 08/28/2017  2:29 AM  Site Condition Dry 08/28/2017  4:01 AM   Vent end date: (not recorded) Vent end time: (not recorded)   Evaluation  O2 sats: stable throughout Complications: No apparent complications Patient did tolerate procedure well. Bilateral Breath Sounds: Clear   Yes  Chriss Driver [facility] 08/28/2017, 6:35 AM

## 2017-08-28 NOTE — Op Note (Signed)
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 Median Sternotomy 2. Extracorporeal circulation 3.   Coronary artery bypass grafting x 3   Left internal mammary graft to the LAD  SVG to OM  SVG to PDA   4.   Endoscopic vein harvest from the left leg   Anesthesia:  General Endotracheal   Clinical History/Surgical Indication:  Patient is a 72 year old gentleman with a strong family history of heart disease as well as history of remote smoking who presented to [facility] this afternoon with onset of persistent substernal chest pain beginning around 1:30 PM.  He reports having episodes of intermittent chest pain over the previous few months that have occurred with and without exertion and usually the last few minutes before they resolve.  The episode today was particularly severe and did not resolve.  Was associated with nausea, some shortness of breath, and a congested feeling.  He waited a little while to see if we get better but it got worse and he became extremely weak and called EMS.  Upon arrival his blood pressure was 200/120 and he was in mild respiratory distress and mildly hypoxic.  Letter cardiogram showed left bundle branch block of unknown duration.  Patient said that his chest pain improved by time he reached [facility] and was minimal by the time he got the Cath Lab.  Catheterization showed diffuse left main and calcified disease with an 80% stenosis.  This area looked hazy.  Proximal LAD was occluded.  The ostial to proximal left circumflex had 95% stenosis.  The right coronary was diffusely diseased with 60% proximal and 90% mid vessel  stenosis.  The ostium of the PDA had about 80% stenosis.  There are collaterals from the right coronary artery to the LAD.  He had a slight puff left ventriculogram which is why there is reasonable systolic function.  Left ventricular end-diastolic pressure was 17 mmHg.  The LAD was probed with 2 wires but the occlusion could not be crossed most likely because was a chronic occlusion.  He had an intra-aortic balloon pump placed in the right common femoral artery due to his anatomy and presentation with likely congestive heart failure and pulmonary edema.  The patient was transferred to [place] for cardiac surgical evaluation.  I think the best treatment for this patient is emergent coronary bypass graft surgery while he is doing well given the severity of his coronary anatomy and presentation. I discussed the operative procedure with the patient and family including alternatives, benefits and risks; including but not limited to bleeding, blood transfusion, infection, stroke, myocardial infarction, graft failure, heart block requiring a permanent pacemaker, organ dysfunction, and death.  Sharin Mons understands and agrees to proceed.     Preparation:  The patient was taken directly back to the operating room. The consent was signed by me. Preoperative antibiotics were given. A pulmonary arterial line and radial arterial line were placed by the anesthesia team. The patient was positioned supine on the operating room table. After being placed under general endotracheal anesthesia by the anesthesia team a foley catheter was placed. The neck, chest, abdomen, and both legs were prepped with betadine soap and solution and draped in the usual sterile manner. A surgical time-out was taken and the correct  patient and operative procedure were confirmed with the nursing and anesthesia staff.  TEE: performed by Dr. Suann Larry  Echo Findings   Left Ventricle Normal cavity size and wall thickness. LV  systolic function is mildly reduced with an EF of 45-50%. Wall motion is abnormal. Basal, inferoseptal wall motion is hypokinetic. No thrombus present. No mass present.  Septum Normal atrial and ventricular septum with no septal defect and normal septal motion. Small Patent Foramen Ovale present with left to right shunt visualized by color doppler.  Left Atrium Left atrium normal in structure and function. No thrombus present. No left atrial appendage. No mass present. No ASD or PFO closure device in interatrial septum. Normal pulmonary vein connection. Pulmonary veins are normal size.  Aortic Valve Structurally normal trileaflet aortic valve with no regurgitation or stenosis.  Aorta No aneurysm present. No plaque present. No graft present. No significant coarctation present. No aortic dissection present  Mitral Valve Normal valve structure. No leaflet thickening and calcification present. No stenosis. Normal leaflet mobility. Mild regurgitation. The jet direction is centrally-directed. No prolapse present. No vegetation present. No abscess present. No flail present.  Right Ventricle Normal cavity size, wall thickness and ejection fraction.  Right Atrium Normal right atrial size.  Tricuspid Valve Normal tricuspid valve structure. No stenosis. No regurgitation.  Pulmonic Valve Normal pulmonic valve structure. No prosthetic valve present. No stenosis. No regurgitation. No vegetation.  Post-Op TEE AORTA Aorta unchanged from pre-bypass.  LEFT VENTRICLE Left ventricle unchanged from pre-bypass.  RIGHT VENTRICLE Right ventricle unchanged from pre-bypass.  AORTIC VALVE Aortic valve unchanged from pre-bypass MITRAL VALVE Mitral valve unchanged from pre-bypass: More mild to moderate MR seen post bypass.  TRICUSPID VALVE Tricuspid valve unchanged from pre-bypass.    Cardiopulmonary Bypass:  A median sternotomy was performed. The pericardium was opened in the midline. Right ventricular function  appeared normal. The ascending aorta was of normal size and had no palpable plaque. There were no contraindications to aortic cannulation or cross-clamping. The patient was fully systemically heparinized and the ACT was maintained > 400 sec. The proximal aortic arch was cannulated with a 20 F aortic cannula for arterial inflow. Venous cannulation was performed via the right atrial appendage using a two-staged venous cannula. An antegrade cardioplegia/vent cannula was inserted into the mid-ascending aorta. Aortic occlusion was performed with a single cross-clamp. Systemic cooling to 32 degrees Centigrade and topical cooling of the heart with iced saline were used. Hyperkalemic antegrade cold blood cardioplegia was used to induce diastolic arrest and was then given at about 20 minute intervals throughout the period of arrest to maintain myocardial temperature at or below 10 degrees centigrade. A temperature probe was inserted into the interventricular septum and an insulating pad was placed in the pericardium.   Left internal mammary harvest:  The left side of the sternum was retracted using the Rultract retractor. The left internal mammary artery was harvested as a pedicle graft. All side branches were clipped. It was a medium-sized vessel of good quality with excellent blood flow. It was ligated distally and divided. It was sprayed with topical papaverine solution to prevent vasospasm.   Endoscopic vein harvest:  The left greater saphenous vein was harvested endoscopically through a 2 cm incision medial to the left knee. It was harvested from the upper thigh to below the knee. It was a large-sized vein of good quality. The side branches were all ligated with 4-0 silk ties.    Coronary arteries:  The coronary arteries were examined.  LAD:  Intramyocardial for most of its course. Located in mid portion where the wall was thickened but no plaque. Patchy scar on the anterolateral wall.  LCX:  The  large branching OM was intramyocardial and could only be visualized proximally where it was large with minimal disease. Patchy scar on the lateral wall.  RCA:  PDA was a large vessel with mild segmental mid vessel disease. Distally it was graftable.   Grafts:  1. LIMA to the LAD: 1.75 mm. It was sewn end to side using 8-0 prolene continuous suture. 2. SVG to OM:  3.0 mm proximally. It was sewn end to side using 7-0 prolene continuous suture. 3. SVG to PDA:  1.75 mm distally. It was sewn end to side using 7-0 prolene continuous suture.   The proximal vein graft anastomoses were performed to the mid-ascending aorta using continuous 6-0 prolene suture. Graft markers were placed around the proximal anastomoses.   Completion:  The patient was rewarmed to 37 degrees Centigrade. The clamp was removed from the LIMA pedicle and there was rapid warming of the septum and return of ventricular fibrillation. The crossclamp was removed with a time of 69 minutes. There was spontaneous return of sinus rhythm. The distal and proximal anastomoses were checked for hemostasis. The position of the grafts was satisfactory. Two temporary epicardial pacing wires were placed on the right atrium and two on the right ventricle. The patient was weaned from CPB without difficulty on dopamine 5 mcg which was quickly weaned to 2.5 mcg.  CPB time was 98 minutes. Cardiac output was 5 LPM. Heparin was fully reversed with protamine and the aortic and venous cannulas removed. Hemostasis was achieved. Mediastinal and left pleural drainage tubes were placed. The sternum was closed with double #6 stainless steel wires. The fascia was closed with continuous # 1 vicryl suture. The subcutaneous tissue was closed with 2-0 vicryl continuous suture. The skin was closed with 3-0 vicryl subcuticular suture. All sponge, needle, and instrument counts were reported correct at the end of the case. Dry sterile dressings were placed over the incisions  and around the chest tubes which were connected to pleurevac suction. The patient was then transported to the surgical intensive care unit in critical but stable condition.

## 2017-08-28 NOTE — Addendum Note (Signed)
Addendum  created 08/28/17 1735 by Babs Bertin, CRNA   Intraprocedure Meds edited

## 2017-08-28 NOTE — Progress Notes (Signed)
VC .90 NIF -22, good cuff leak. Pt extubated to a 4 lt [place] SATS 94%. IS 250 x 6

## 2017-08-28 NOTE — Progress Notes (Signed)
1 Day Post-Op Procedure(s) (LRB): CORONARY ARTERY BYPASS GRAFTING (CABG) ON PUMP USING LEFT INTERNAL MAMMARY ARTERY AND LEFT GREATER SAPHENOUS VEIN VIA ENDOVEIN HARVEST (N/A) Subjective: Complains of pain  Objective: Vital signs in last 24 hours: Temp:  [97.9 F (36.6 C)-99 F (37.2 C)] 99 F (37.2 C) (07/07 0745) Pulse Rate:  [68-123] 97 (07/07 0745) Cardiac Rhythm: Normal sinus rhythm;Bundle branch block (07/07 0245) Resp:  [12-27] 20 (07/07 0745) BP: (82-125)/(50-90) 97/61 (07/07 0715) SpO2:  [88 %-99 %] 96 % (07/07 0745) Arterial Line BP: (79-123)/(56-97) 101/69 (07/07 0745) FiO2 (%):  [40 %-100 %] 40 % (07/07 0555) Weight:  [100.1 kg (220 lb 10.9 oz)-104.3 kg (229 lb 15 oz)] 100.1 kg (220 lb 10.9 oz) (07/07 0645)  Hemodynamic parameters for last 24 hours: PAP: (20-47)/(11-25) 30/14 CO:  [3.9 L/min-6.4 L/min] 6.4 L/min CI:  [1.8 L/min/m2-2.9 L/min/m2] 2.9 L/min/m2  Intake/Output from previous day: 07/06 0701 - 07/07 0700 In: 2293 [I.V.:1516.7; Blood:600; IV Piggyback:126.3] Out: 1730 [Urine:1300; Blood:300; Chest Tube:130] Intake/Output this shift: No intake/output data recorded.  General appearance: alert and cooperative Neurologic: intact Heart: regular rate and rhythm, S1, S2 normal, no murmur, click, rub or gallop Lungs: clear to auscultation bilaterally Extremities: edema mild, pedal pulses palpable Wound: dressings dry IABP right groin looks dry, no hematoma  Lab Results: Recent Labs    08/27/17 1642 08/28/17 0003 08/28/17 0213 08/28/17 0228 08/28/17 0733  WBC 12.6*  --  20.7*  --   --   HGB 17.2 11.2* 13.9 13.3 12.2*  HCT 50.7 34.3* 42.0 39.0 36.0*  PLT 216 160 145*  --   --    BMET:  Recent Labs    08/27/17 1642 08/28/17 0227 08/28/17 0228 08/28/17 0733  NA 143 142 142 144  K 3.9 4.6 4.5 4.4  CL 111 114*  --  109  CO2 24 24  --   --   GLUCOSE 151* 139* 131* 127*  BUN 17 14  --  18  CREATININE 1.34* 1.24  --  1.00  CALCIUM 9.0 8.5*  --    --     PT/INR:  Recent Labs    08/28/17 0213  LABPROT 16.5*  INR 1.34   ABG    Component Value Date/Time   PHART 7.325 (L) 08/28/2017 0732   HCO3 21.4 08/28/2017 0732   TCO2 21 (L) 08/28/2017 0733   ACIDBASEDEF 4.0 (H) 08/28/2017 0732   O2SAT 96.0 08/28/2017 0732   CBG (last 3)  Recent Labs    08/28/17 0357 08/28/17 0459 08/28/17 0604  GLUCAP 114* 119* 124*   CXR: mild pulmonary vascular congestion  Assessment/Plan: S/P Procedure(s) (LRB): CORONARY ARTERY BYPASS GRAFTING (CABG) ON PUMP USING LEFT INTERNAL MAMMARY ARTERY AND LEFT GREATER SAPHENOUS VEIN VIA ENDOVEIN HARVEST (N/A)  He is hemodynamically stable in sinus rhythm. CI 2.9 on dop 2.5, neo. Will wean and DC IABP which was placed for anatomy. Hold beta blocker until stable off pressors.  Keep chest tubes in today.  Diurese once BP stable off pressors.   OOB later today.  Glucose under good control. Preop Hgb A1c was 5.6. Will follow CBG's for now.   LOS: 1 day    Gaye Pollack 08/28/2017

## 2017-08-28 NOTE — Anesthesia Postprocedure Evaluation (Signed)
Anesthesia Post Note  Patient: Teacher, music  Procedure(s) Performed: CORONARY ARTERY BYPASS GRAFTING (CABG) ON PUMP USING LEFT INTERNAL MAMMARY ARTERY AND LEFT GREATER SAPHENOUS VEIN VIA ENDOVEIN HARVEST (N/A Chest)     Patient location during evaluation: SICU Anesthesia Type: General Level of consciousness: sedated Pain management: pain level controlled Vital Signs Assessment: post-procedure vital signs reviewed and stable Respiratory status: patient remains intubated per anesthesia plan Cardiovascular status: stable Postop Assessment: no apparent nausea or vomiting Anesthetic complications: no    Last Vitals:  Vitals:   08/28/17 0630 08/28/17 0645  BP: 100/62 (!) 82/61  Pulse: 99 96  Resp: (!) 23 (!) 27  Temp: 37.2 C 37.1 C  SpO2: 94% 93%    Last Pain:  Vitals:   08/27/17 2000  PainSc: 0-No pain                 Tredarius Cobern,W. EDMOND

## 2017-08-28 NOTE — Brief Op Note (Signed)
08/27/2017  12:31 AM  PATIENT:  Cory Hess  72 y.o. male  PRE-OPERATIVE DIAGNOSIS:  1. S/p STEMI 2. SEVERE CORONARY ARTERY DISEASE (with LEFT MAIN DISEASE INCLUDED)  POST-OPERATIVE DIAGNOSIS:  1. S/p STEMI 2. SEVERE CORONARY ARTERY DISEASE (with LEFT MAIN DISEASE INCLUDED)  PROCEDURE:  EMERGENT CORONARY ARTERY BYPASS GRAFTING (CABG) 3 (LIMA to LAD, SVG to LEFT CIRCUMFLEX, SVG to PDA) ON PUMP USING LEFT INTERNAL MAMMARY ARTERY AND LEFT GREATER SAPHENOUS VEIN VIA ENDOVEIN HARVEST   SURGEON:  Surgeon(s) and Role:    Gaye Pollack, MD - Primary  PHYSICIAN ASSISTANT: Lars Pinks PA-C  ASSISTANTS: Dineen Kid RNFA  ANESTHESIA:   general   DRAINS: Chest tubes placed in the mediastinal and pleural spaces   COUNTS CORRECT:  YES  DICTATION: .Dragon Dictation  PLAN OF CARE: Admit to inpatient   PATIENT DISPOSITION:  ICU - intubated and hemodynamically stable.   Delay start of Pharmacological VTE agent (>24hrs) due to surgical blood loss or risk of bleeding: yes  BASELINE WEIGHT: 104.3 kg

## 2017-08-28 NOTE — Plan of Care (Signed)
  Problem: Education: Goal: Knowledge of General Education information will improve Outcome: Progressing   Problem: Health Behavior/Discharge Planning: Goal: Ability to manage health-related needs will improve Outcome: Progressing   Problem: Clinical Measurements: Goal: Ability to maintain clinical measurements within normal limits will improve Outcome: Progressing Goal: Will remain free from infection Outcome: Progressing Goal: Diagnostic test results will improve Outcome: Progressing Goal: Respiratory complications will improve Outcome: Progressing Goal: Cardiovascular complication will be avoided Outcome: Progressing   Problem: Nutrition: Goal: Adequate nutrition will be maintained Outcome: Progressing   Problem: Coping: Goal: Level of anxiety will decrease Outcome: Progressing   Problem: Pain Managment: Goal: General experience of comfort will improve Outcome: Progressing   Problem: Safety: Goal: Ability to remain free from injury will improve Outcome: Progressing   Problem: Education: Goal: Ability to demonstrate proper wound care will improve Outcome: Progressing Goal: Knowledge of disease or condition will improve Outcome: Progressing Goal: Knowledge of the prescribed therapeutic regimen will improve Outcome: Progressing   Problem: Activity: Goal: Risk for activity intolerance will decrease Outcome: Progressing   Problem: Cardiac: Goal: Hemodynamic stability will improve Outcome: Progressing   Problem: Clinical Measurements: Goal: Postoperative complications will be avoided or minimized Outcome: Progressing   Problem: Respiratory: Goal: Respiratory status will improve Outcome: Progressing   Problem: Skin Integrity: Goal: Wound healing without signs and symptoms of infection Outcome: Progressing Goal: Risk for impaired skin integrity will decrease Outcome: Progressing   Problem: Urinary Elimination: Goal: Ability to achieve and maintain  adequate renal perfusion and functioning will improve Outcome: Progressing

## 2017-08-28 NOTE — Progress Notes (Signed)
   Emergency surgery for severe three-vessel coronary disease by Dr. Cyndia Bent: Left internal mammary graft to the LAD SVG to OM SVG to PDA  Awake, alert, with postop EKG revealing left bundle branch block with slow heart rate done preop.  Clinically stable.  Continue to follow and advise if needed.

## 2017-08-29 ENCOUNTER — Encounter: Payer: Self-pay | Admitting: Cardiovascular Disease

## 2017-08-29 ENCOUNTER — Inpatient Hospital Stay (HOSPITAL_COMMUNITY): Payer: Medicare Other

## 2017-08-29 LAB — POCT I-STAT, CHEM 8
BUN: 27 mg/dL — ABNORMAL HIGH (ref 8–23)
CALCIUM ION: 1.22 mmol/L (ref 1.15–1.40)
CREATININE: 1.1 mg/dL (ref 0.61–1.24)
Chloride: 102 mmol/L (ref 98–111)
GLUCOSE: 119 mg/dL — AB (ref 70–99)
HCT: 33 % — ABNORMAL LOW (ref 39.0–52.0)
HEMOGLOBIN: 11.2 g/dL — AB (ref 13.0–17.0)
POTASSIUM: 4.1 mmol/L (ref 3.5–5.1)
Sodium: 138 mmol/L (ref 135–145)
TCO2: 25 mmol/L (ref 22–32)

## 2017-08-29 LAB — GLUCOSE, CAPILLARY
GLUCOSE-CAPILLARY: 108 mg/dL — AB (ref 70–99)
GLUCOSE-CAPILLARY: 99 mg/dL (ref 70–99)
GLUCOSE-CAPILLARY: 112 mg/dL — AB (ref 70–99)

## 2017-08-29 LAB — CBC
HEMATOCRIT: 37.2 % — AB (ref 39.0–52.0)
Hemoglobin: 11.9 g/dL — ABNORMAL LOW (ref 13.0–17.0)
MCH: 29.9 pg (ref 26.0–34.0)
MCHC: 32 g/dL (ref 30.0–36.0)
MCV: 93.5 fL (ref 78.0–100.0)
Platelets: 111 10*3/uL — ABNORMAL LOW (ref 150–400)
RBC: 3.98 MIL/uL — AB (ref 4.22–5.81)
RDW: 13.6 % (ref 11.5–15.5)
WBC: 16 10*3/uL — ABNORMAL HIGH (ref 4.0–10.5)

## 2017-08-29 LAB — BASIC METABOLIC PANEL
Anion gap: 5 (ref 5–15)
BUN: 24 mg/dL — ABNORMAL HIGH (ref 8–23)
CHLORIDE: 108 mmol/L (ref 98–111)
CO2: 27 mmol/L (ref 22–32)
CREATININE: 1.35 mg/dL — AB (ref 0.61–1.24)
Calcium: 8.5 mg/dL — ABNORMAL LOW (ref 8.9–10.3)
GFR calc non Af Amer: 52 mL/min — ABNORMAL LOW (ref >60)
GFR, EST AFRICAN AMERICAN: 60 mL/min — AB (ref >60)
Glucose, Bld: 122 mg/dL — ABNORMAL HIGH (ref 70–99)
POTASSIUM: 4.2 mmol/L (ref 3.5–5.1)
Sodium: 140 mmol/L (ref 135–145)

## 2017-08-29 MED ORDER — METOCLOPRAMIDE HCL 5 MG/ML IJ SOLN
10.0000 mg | Freq: Four times a day (QID) | INTRAMUSCULAR | Status: DC
  Administered 2017-08-29 – 2017-08-30 (×3): 10 mg via INTRAVENOUS
  Filled 2017-08-29 (×3): qty 2

## 2017-08-29 MED ORDER — METOPROLOL TARTRATE 25 MG PO TABS
25.0000 mg | ORAL_TABLET | Freq: Two times a day (BID) | ORAL | Status: DC
  Administered 2017-08-29: 25 mg via ORAL
  Filled 2017-08-29: qty 1

## 2017-08-29 MED ORDER — ENOXAPARIN SODIUM 40 MG/0.4ML ~~LOC~~ SOLN
40.0000 mg | Freq: Every day | SUBCUTANEOUS | Status: DC
  Administered 2017-08-29 – 2017-09-01 (×4): 40 mg via SUBCUTANEOUS
  Filled 2017-08-29 (×4): qty 0.4

## 2017-08-29 NOTE — Progress Notes (Signed)
CT surgery p.m. Rounds  Patient feels poorly postop day 2 emergency CABG [facility] in hallway with assistance Sinus rhythm blood pressure 120/70 Neuro intact Follow creatinine tomorrow

## 2017-08-29 NOTE — Progress Notes (Signed)
EKG CRITICAL VALUE     12 lead EKG performed.  Critical value noted.  Wells Guiles, RN notified.   Genia Plants, CCT 08/29/2017 7:14 AM

## 2017-08-29 NOTE — Progress Notes (Signed)
2 Days Post-Op Procedure(s) (LRB): CORONARY ARTERY BYPASS GRAFTING (CABG) ON PUMP USING LEFT INTERNAL MAMMARY ARTERY AND LEFT GREATER SAPHENOUS VEIN VIA ENDOVEIN HARVEST (N/A) Subjective: No specific complaints  Objective: Vital signs in last 24 hours: Temp:  [98.5 F (36.9 C)-99.7 F (37.6 C)] 98.5 F (36.9 C) (07/07 2300) Pulse Rate:  [87-119] 88 (07/08 0700) Cardiac Rhythm: Sinus tachycardia (07/07 2000) Resp:  [17-31] 17 (07/08 0700) BP: (97-152)/(60-95) 120/63 (07/08 0700) SpO2:  [92 %-100 %] 93 % (07/08 0700) Arterial Line BP: (93-106)/(68-79) 106/79 (07/07 1000) Weight:  [108.2 kg (238 lb 8.6 oz)-109 kg (240 lb 4.8 oz)] 108.2 kg (238 lb 8.6 oz) (07/08 0600)  Hemodynamic parameters for last 24 hours: PAP: (20-30)/(11-17) 29/16 CO:  [4.7 L/min-5.7 L/min] 5.7 L/min CI:  [2.1 L/min/m2-2.6 L/min/m2] 2.6 L/min/m2  Intake/Output from previous day: 07/07 0701 - 07/08 0700 In: 1811.7 [P.O.:660; I.V.:736.3; IV Piggyback:415.5] Out: 2555 [Urine:1885; Chest Tube:670] Intake/Output this shift: No intake/output data recorded.  General appearance: alert and cooperative Neurologic: intact Heart: regular rate and rhythm, S1, S2 normal, no murmur, click, rub or gallop Lungs: clear to auscultation bilaterally Extremities: edema mild Wound: dressings dry  Lab Results: Recent Labs    08/28/17 1507 08/28/17 1516 08/29/17 0600  WBC 16.4*  --  16.0*  HGB 12.3* 11.9* 11.9*  HCT 38.0* 35.0* 37.2*  PLT 121*  --  111*   BMET:  Recent Labs    08/28/17 0227  08/28/17 1516 08/29/17 0600  NA 142   < > 142 140  K 4.6   < > 4.3 4.2  CL 114*   < > 106 108  CO2 24  --   --  27  GLUCOSE 139*   < > 119* 122*  BUN 14   < > 18 24*  CREATININE 1.24   < > 1.10 1.35*  CALCIUM 8.5*  --   --  8.5*   < > = values in this interval not displayed.    PT/INR:  Recent Labs    08/28/17 0213  LABPROT 16.5*  INR 1.34   ABG    Component Value Date/Time   PHART 7.325 (L) 08/28/2017 0732   HCO3 21.4 08/28/2017 0732   TCO2 23 08/28/2017 1516   ACIDBASEDEF 4.0 (H) 08/28/2017 0732   O2SAT 96.0 08/28/2017 0732   CBG (last 3)  Recent Labs    08/28/17 1111 08/28/17 1515 08/28/17 1948  GLUCAP 125* 113* 132*   CXR: pulmonary vascular congestion and mild atelectasis  ECG: sinus, QRS duration decreased to 122 ms from 140's. Non-specific anterolateral changes.  Assessment/Plan: S/P Procedure(s) (LRB): CORONARY ARTERY BYPASS GRAFTING (CABG) ON PUMP USING LEFT INTERNAL MAMMARY ARTERY AND LEFT GREATER SAPHENOUS VEIN VIA ENDOVEIN HARVEST (N/A)  POD 1 He is hemodynamically stable in sinus rhythm. Continue low dose Lopressor.  DC chest tubes  Mild bump in creat probably due to cath, surgery, Toradol. Hold off on diuretic today and follow up tomorrow.  Continue IS, ambulation   LOS: 2 days    Gaye Pollack 08/29/2017

## 2017-08-30 ENCOUNTER — Inpatient Hospital Stay (HOSPITAL_COMMUNITY): Payer: Medicare Other

## 2017-08-30 LAB — POCT I-STAT, CHEM 8
BUN: 14 mg/dL (ref 8–23)
BUN: 15 mg/dL (ref 8–23)
BUN: 15 mg/dL (ref 8–23)
BUN: 16 mg/dL (ref 8–23)
BUN: 16 mg/dL (ref 8–23)
CALCIUM ION: 1.11 mmol/L — AB (ref 1.15–1.40)
CALCIUM ION: 1.24 mmol/L (ref 1.15–1.40)
CALCIUM ION: 1.35 mmol/L (ref 1.15–1.40)
CHLORIDE: 104 mmol/L (ref 98–111)
CHLORIDE: 110 mmol/L (ref 98–111)
CHLORIDE: 108 mmol/L (ref 98–111)
CREATININE: 0.9 mg/dL (ref 0.61–1.24)
CREATININE: 1.1 mg/dL (ref 0.61–1.24)
CREATININE: 1.2 mg/dL (ref 0.61–1.24)
Calcium, Ion: 0.94 mmol/L — ABNORMAL LOW (ref 1.15–1.40)
Calcium, Ion: 1.13 mmol/L — ABNORMAL LOW (ref 1.15–1.40)
Chloride: 106 mmol/L (ref 98–111)
Chloride: 107 mmol/L (ref 98–111)
Creatinine, Ser: 1.1 mg/dL (ref 0.61–1.24)
Creatinine, Ser: 1.2 mg/dL (ref 0.61–1.24)
GLUCOSE: 148 mg/dL — AB (ref 70–99)
GLUCOSE: 148 mg/dL — AB (ref 70–99)
GLUCOSE: 153 mg/dL — AB (ref 70–99)
Glucose, Bld: 109 mg/dL — ABNORMAL HIGH (ref 70–99)
Glucose, Bld: 121 mg/dL — ABNORMAL HIGH (ref 70–99)
HCT: 31 % — ABNORMAL LOW (ref 39.0–52.0)
HCT: 32 % — ABNORMAL LOW (ref 39.0–52.0)
HEMATOCRIT: 31 % — AB (ref 39.0–52.0)
HEMATOCRIT: 31 % — AB (ref 39.0–52.0)
HEMATOCRIT: 37 % — AB (ref 39.0–52.0)
HEMOGLOBIN: 10.5 g/dL — AB (ref 13.0–17.0)
HEMOGLOBIN: 10.9 g/dL — AB (ref 13.0–17.0)
Hemoglobin: 10.5 g/dL — ABNORMAL LOW (ref 13.0–17.0)
Hemoglobin: 12.6 g/dL — ABNORMAL LOW (ref 13.0–17.0)
Hemoglobin: 10.5 g/dL — ABNORMAL LOW (ref 13.0–17.0)
POTASSIUM: 4.7 mmol/L (ref 3.5–5.1)
POTASSIUM: 6.6 mmol/L — AB (ref 3.5–5.1)
Potassium: 4.7 mmol/L (ref 3.5–5.1)
Potassium: 5.8 mmol/L — ABNORMAL HIGH (ref 3.5–5.1)
Potassium: 6.6 mmol/L (ref 3.5–5.1)
SODIUM: 143 mmol/L (ref 135–145)
SODIUM: 144 mmol/L (ref 135–145)
Sodium: 141 mmol/L (ref 135–145)
Sodium: 141 mmol/L (ref 135–145)
Sodium: 143 mmol/L (ref 135–145)
TCO2: 21 mmol/L — ABNORMAL LOW (ref 22–32)
TCO2: 24 mmol/L (ref 22–32)
TCO2: 24 mmol/L (ref 22–32)
TCO2: 25 mmol/L (ref 22–32)
TCO2: 26 mmol/L (ref 22–32)

## 2017-08-30 LAB — POCT I-STAT 3, ART BLOOD GAS (G3+)
Acid-base deficit: 2 mmol/L (ref 0.0–2.0)
BICARBONATE: 25 mmol/L (ref 20.0–28.0)
Bicarbonate: 23.3 mmol/L (ref 20.0–28.0)
O2 SAT: 100 %
O2 SAT: 98 %
PCO2 ART: 39.2 mmHg (ref 32.0–48.0)
PH ART: 7.366 (ref 7.350–7.450)
PO2 ART: 308 mmHg — AB (ref 83.0–108.0)
TCO2: 26 mmol/L (ref 22–32)
TCO2: 25 mmol/L (ref 22–32)
pCO2 arterial: 40.7 mmHg (ref 32.0–48.0)
pH, Arterial: 7.412 (ref 7.350–7.450)
pO2, Arterial: 118 mmHg — ABNORMAL HIGH (ref 83.0–108.0)

## 2017-08-30 LAB — BASIC METABOLIC PANEL
ANION GAP: 11 (ref 5–15)
BUN: 25 mg/dL — ABNORMAL HIGH (ref 8–23)
CALCIUM: 8.3 mg/dL — AB (ref 8.9–10.3)
CO2: 21 mmol/L — ABNORMAL LOW (ref 22–32)
Chloride: 106 mmol/L (ref 98–111)
Creatinine, Ser: 1.04 mg/dL (ref 0.61–1.24)
Glucose, Bld: 111 mg/dL — ABNORMAL HIGH (ref 70–99)
POTASSIUM: 3.8 mmol/L (ref 3.5–5.1)
SODIUM: 138 mmol/L (ref 135–145)

## 2017-08-30 LAB — CBC
HEMATOCRIT: 33.2 % — AB (ref 39.0–52.0)
HEMOGLOBIN: 10.7 g/dL — AB (ref 13.0–17.0)
MCH: 30.1 pg (ref 26.0–34.0)
MCHC: 32.2 g/dL (ref 30.0–36.0)
MCV: 93.5 fL (ref 78.0–100.0)
Platelets: 99 10*3/uL — ABNORMAL LOW (ref 150–400)
RBC: 3.55 MIL/uL — ABNORMAL LOW (ref 4.22–5.81)
RDW: 13.1 % (ref 11.5–15.5)
WBC: 12.3 10*3/uL — AB (ref 4.0–10.5)

## 2017-08-30 MED ORDER — ONDANSETRON HCL 4 MG PO TABS
4.0000 mg | ORAL_TABLET | Freq: Four times a day (QID) | ORAL | Status: DC | PRN
Start: 2017-08-30 — End: 2017-09-02
  Administered 2017-08-30: 4 mg via ORAL
  Filled 2017-08-30: qty 1

## 2017-08-30 MED ORDER — SODIUM CHLORIDE 0.9% FLUSH: 3.0000 mL | INTRAVENOUS | Status: DC | PRN

## 2017-08-30 MED ORDER — MOVING RIGHT ALONG BOOK
Freq: Once | Status: DC
  Filled 2017-08-30: qty 1

## 2017-08-30 MED ORDER — ACETAMINOPHEN 325 MG PO TABS: 650.0000 mg | ORAL_TABLET | Freq: Four times a day (QID) | ORAL | Status: DC | PRN

## 2017-08-30 MED ORDER — METOPROLOL TARTRATE 25 MG PO TABS
25.0000 mg | ORAL_TABLET | Freq: Two times a day (BID) | ORAL | Status: DC
  Administered 2017-08-30 – 2017-08-31 (×4): 25 mg via ORAL
  Filled 2017-08-30 (×4): qty 1

## 2017-08-30 MED ORDER — CLOPIDOGREL BISULFATE 75 MG PO TABS
75.0000 mg | ORAL_TABLET | Freq: Every day | ORAL | Status: DC
  Administered 2017-08-30 – 2017-09-02 (×4): 75 mg via ORAL
  Filled 2017-08-30 (×4): qty 1

## 2017-08-30 MED ORDER — POTASSIUM CHLORIDE CRYS ER 20 MEQ PO TBCR
20.0000 meq | EXTENDED_RELEASE_TABLET | Freq: Two times a day (BID) | ORAL | Status: AC
  Administered 2017-08-30 – 2017-09-01 (×6): 20 meq via ORAL
  Filled 2017-08-30 (×6): qty 1

## 2017-08-30 MED ORDER — BISACODYL 10 MG RE SUPP: 10.0000 mg | Freq: Every day | RECTAL | Status: DC | PRN

## 2017-08-30 MED ORDER — DOCUSATE SODIUM 100 MG PO CAPS
200.0000 mg | ORAL_CAPSULE | Freq: Every day | ORAL | Status: DC
  Administered 2017-08-30 – 2017-08-31 (×2): 200 mg via ORAL
  Filled 2017-08-30 (×2): qty 2

## 2017-08-30 MED ORDER — SODIUM CHLORIDE 0.9 % IV SOLN: 250.0000 mL | INTRAVENOUS | Status: DC | PRN

## 2017-08-30 MED ORDER — ONDANSETRON HCL 4 MG/2ML IJ SOLN: 4.0000 mg | Freq: Four times a day (QID) | INTRAMUSCULAR | Status: DC | PRN

## 2017-08-30 MED ORDER — BISACODYL 5 MG PO TBEC: 10.0000 mg | DELAYED_RELEASE_TABLET | Freq: Every day | ORAL | Status: DC | PRN

## 2017-08-30 MED ORDER — OXYCODONE HCL 5 MG PO TABS
5.0000 mg | ORAL_TABLET | ORAL | Status: DC | PRN
  Administered 2017-08-30 – 2017-08-31 (×3): 10 mg via ORAL
  Administered 2017-08-31: 5 mg via ORAL
  Administered 2017-08-31 – 2017-09-02 (×8): 10 mg via ORAL
  Filled 2017-08-30 (×3): qty 2
  Filled 2017-08-30: qty 1
  Filled 2017-08-30 (×9): qty 2

## 2017-08-30 MED ORDER — SODIUM CHLORIDE 0.9% FLUSH
3.0000 mL | Freq: Two times a day (BID) | INTRAVENOUS | Status: DC
  Administered 2017-08-30 – 2017-09-01 (×6): 3 mL via INTRAVENOUS

## 2017-08-30 MED ORDER — PANTOPRAZOLE SODIUM 40 MG PO TBEC
40.0000 mg | DELAYED_RELEASE_TABLET | Freq: Every day | ORAL | Status: DC
  Administered 2017-08-30 – 2017-09-02 (×4): 40 mg via ORAL
  Filled 2017-08-30 (×4): qty 1

## 2017-08-30 MED ORDER — ASPIRIN EC 325 MG PO TBEC
325.0000 mg | DELAYED_RELEASE_TABLET | Freq: Every day | ORAL | Status: DC
  Administered 2017-08-30 – 2017-08-31 (×2): 325 mg via ORAL
  Filled 2017-08-30 (×2): qty 1

## 2017-08-30 MED ORDER — FUROSEMIDE 40 MG PO TABS
40.0000 mg | ORAL_TABLET | Freq: Every day | ORAL | Status: AC
  Administered 2017-08-30 – 2017-09-01 (×3): 40 mg via ORAL
  Filled 2017-08-30 (×3): qty 1

## 2017-08-30 NOTE — Progress Notes (Signed)
3 Days Post-Op Procedure(s) (LRB): CORONARY ARTERY BYPASS GRAFTING (CABG) ON PUMP USING LEFT INTERNAL MAMMARY ARTERY AND LEFT GREATER SAPHENOUS VEIN VIA ENDOVEIN HARVEST (N/A) Subjective: No complaints  Objective: Vital signs in last 24 hours: Temp:  [98.2 F (36.8 C)-99.3 F (37.4 C)] 98.2 F (36.8 C) (07/09 0744) Pulse Rate:  [65-103] 90 (07/09 0700) Cardiac Rhythm: Normal sinus rhythm (07/09 0749) Resp:  [13-26] 17 (07/09 0700) BP: (92-143)/(61-80) 123/75 (07/09 0700) SpO2:  [91 %-96 %] 95 % (07/08 2000) Weight:  [95 kg (209 lb 7 oz)] 95 kg (209 lb 7 oz) (07/09 0600)  Hemodynamic parameters for last 24 hours:    Intake/Output from previous day: 07/08 0701 - 07/09 0700 In: 825.7 [I.V.:250.9; IV Piggyback:109.9] Out: 510 [Urine:510] Intake/Output this shift: No intake/output data recorded.  General appearance: alert and cooperative Neurologic: intact Heart: regular rate and rhythm, S1, S2 normal, no murmur, click, rub or gallop Lungs: clear to auscultation bilaterally Extremities: edema mild Wound: dressings dry  Lab Results: Recent Labs    08/29/17 0600 08/29/17 1648 08/30/17 0343  WBC 16.0*  --  12.3*  HGB 11.9* 11.2* 10.7*  HCT 37.2* 33.0* 33.2*  PLT 111*  --  99*   BMET:  Recent Labs    08/29/17 0600 08/29/17 1648 08/30/17 0343  NA 140 138 138  K 4.2 4.1 3.8  CL 108 102 106  CO2 27  --  21*  GLUCOSE 122* 119* 111*  BUN 24* 27* 25*  CREATININE 1.35* 1.10 1.04  CALCIUM 8.5*  --  8.3*    PT/INR:  Recent Labs    08/28/17 0213  LABPROT 16.5*  INR 1.34   ABG    Component Value Date/Time   PHART 7.325 (L) 08/28/2017 0732   HCO3 21.4 08/28/2017 0732   TCO2 25 08/29/2017 1648   ACIDBASEDEF 4.0 (H) 08/28/2017 0732   O2SAT 96.0 08/28/2017 0732   CBG (last 3)  Recent Labs    08/29/17 0741 08/29/17 1156 08/29/17 1540  GLUCAP 108* 99 112*   CXR: mild bibasilar atelectasis  Assessment/Plan: S/P Procedure(s) (LRB): CORONARY ARTERY BYPASS  GRAFTING (CABG) ON PUMP USING LEFT INTERNAL MAMMARY ARTERY AND LEFT GREATER SAPHENOUS VEIN VIA ENDOVEIN HARVEST (N/A)  POD 2 Hemodynamically stable in sinus rhythm. Continue Lopressor Mild volume excess: will continue diuretic and KCL for a few days. ASA and Plavix with recent MI. DC sleeve and foley Transfer to 4E and continue mobilization and IS.   LOS: 3 days    Gaye Pollack 08/30/2017

## 2017-08-30 NOTE — Progress Notes (Signed)
Arrived from 2 heart.  V/S taken, CCMD notified with portable monitor.

## 2017-08-30 NOTE — Progress Notes (Signed)
Report called to [facility] on 4E. Patient transferring to room 10. All belongings accounted for.

## 2017-08-30 NOTE — Plan of Care (Deleted)
Patient progressing well, will be participating in a clinical trial and has gone over information with the MD and the RN involved in the study. Patient has no complaints of pain, remains stable, has no questions at this time. Discharge is planned for this afternoon once his infusion is complete and post labs have been drawn.  Follow up will be determined by the study team as well as the cardiology specialist.

## 2017-08-30 NOTE — Progress Notes (Signed)
Upon assessment noted that IJ Introducer has already been DC'd. Spoke with patient's nurse Sharyn Lull. She stated "pulled on 2H before transfer." Called 2H nurse [facility] RN had previously had patient but has since left. This nurse will close line documentation on flow sheet. Upon assessment, pressure drsg. CDI. Fran Lowes, RN VAST

## 2017-08-30 NOTE — Plan of Care (Signed)
Patient progressing well. Up to chair and then back to bed with no complications. On room air with adequate saturation. Foley has been removed, patient has voided small amount post removal. Patient has been using his IS frequently and has been achieving at least 1000 each time and does repetitions of 5+, attempting to do so every hour per RN's request. He has requested an advance in his diet and will get a heart healthy meal at lunch today. Transfer orders are in place and bed request in is in process.

## 2017-08-31 LAB — POCT I-STAT 3, ART BLOOD GAS (G3+)
Acid-base deficit: 4 mmol/L — ABNORMAL HIGH (ref 0.0–2.0)
Bicarbonate: 23.2 mmol/L (ref 20.0–28.0)
O2 SAT: 98 %
PCO2 ART: 48.4 mmHg — AB (ref 32.0–48.0)
PO2 ART: 114 mmHg — AB (ref 83.0–108.0)
TCO2: 25 mmol/L (ref 22–32)
pH, Arterial: 7.289 — ABNORMAL LOW (ref 7.350–7.450)

## 2017-08-31 MED ORDER — LISINOPRIL 10 MG PO TABS
20.0000 mg | ORAL_TABLET | Freq: Every day | ORAL | Status: DC
  Administered 2017-08-31 – 2017-09-02 (×3): 20 mg via ORAL
  Filled 2017-08-31 (×3): qty 2

## 2017-08-31 MED FILL — Sodium Bicarbonate IV Soln 8.4%: INTRAVENOUS | Qty: 50 | Status: AC

## 2017-08-31 MED FILL — Mannitol IV Soln 20%: INTRAVENOUS | Qty: 500 | Status: AC

## 2017-08-31 MED FILL — Heparin Sodium (Porcine) Inj 1000 Unit/ML: INTRAMUSCULAR | Qty: 20 | Status: AC

## 2017-08-31 MED FILL — Lidocaine HCl(Cardiac) IV PF Soln Pref Syr 100 MG/5ML (2%): INTRAVENOUS | Qty: 5 | Status: AC

## 2017-08-31 MED FILL — Electrolyte-R (PH 7.4) Solution: INTRAVENOUS | Qty: 3000 | Status: AC

## 2017-08-31 MED FILL — Sodium Chloride IV Soln 0.9%: INTRAVENOUS | Qty: 2000 | Status: AC

## 2017-08-31 NOTE — Discharge Instructions (Signed)
Endoscopic Saphenous Vein Harvesting, Care After °Refer to this sheet in the next few weeks. These instructions provide you with information about caring for yourself after your procedure. Your health care provider may also give you more specific instructions. Your treatment has been planned according to current medical practices, but problems sometimes occur. Call your health care provider if you have any problems or questions after your procedure. °What can I expect after the procedure? °After the procedure, it is common to have: °· Pain. °· Bruising. °· Swelling. °· Numbness. ° °Follow these instructions at home: °Medicine °· Take over-the-counter and prescription medicines only as told by your health care provider. °· Do not drive or operate heavy machinery while taking prescription pain medicine. °Incision care ° °· Follow instructions from your health care provider about how to take care of the cut made during surgery (incision). Make sure you: °? Wash your hands with soap and water before you change your bandage (dressing). If soap and water are not available, use hand sanitizer. °? Change your dressing as told by your health care provider. °? Leave stitches (sutures), skin glue, or adhesive strips in place. These skin closures may need to be in place for 2 weeks or longer. If adhesive strip edges start to loosen and curl up, you may trim the loose edges. Do not remove adhesive strips completely unless your health care provider tells you to do that. °· Check your incision area every day for signs of infection. Check for: °? More redness, swelling, or pain. °? More fluid or blood. °? Warmth. °? Pus or a bad smell. °General instructions °· Raise (elevate) your legs above the level of your heart while you are sitting or lying down. °· Do any exercises your health care providers have given you. These may include deep breathing, coughing, and walking exercises. °· Do not shower, take baths, swim, or use a hot tub  unless told by your health care provider. °· Wear your elastic stocking if told by your health care provider. °· Keep all follow-up visits as told by your health care provider. This is important. °Contact a health care provider if: °· Medicine does not help your pain. °· Your pain gets worse. °· You have new leg bruises or your leg bruises get bigger. °· You have a fever. °· Your leg feels numb. °· You have more redness, swelling, or pain around your incision. °· You have more fluid or blood coming from your incision. °· Your incision feels warm to the touch. °· You have pus or a bad smell coming from your incision. °Get help right away if: °· Your pain is severe. °· You develop pain, tenderness, warmth, redness, or swelling in any part of your leg. °· You have chest pain. °· You have trouble breathing. °This information is not intended to replace advice given to you by your health care provider. Make sure you discuss any questions you have with your health care provider. °Document Released: 10/21/2010 Document Revised: 07/17/2015 Document Reviewed: 12/23/2014 °Elsevier Interactive Patient Education © 2018 [employer] °Coronary Artery Bypass Grafting, Care After °These instructions give you information on caring for yourself after your procedure. Your doctor may also give you more specific instructions. Call your doctor if you have any problems or questions after your procedure. °Follow these instructions at home: °· Only take medicine as told by your doctor. Take medicines exactly as told. Do not stop taking medicines or start any new medicines without talking to your doctor first. °·   Take your pulse as told by your doctor. °· Do deep breathing as told by your doctor. Use your breathing device (incentive spirometer), if given, to practice deep breathing several times a day. Support your chest with a pillow or your arms when you take deep breaths or cough. °· Keep the area clean, dry, and protected where the  surgery cuts (incisions) were made. Remove bandages (dressings) only as told by your doctor. If strips were applied to surgical area, do not take them off. They fall off on their own. °· Check the surgery area daily for puffiness (swelling), redness, or leaking fluid. °· If surgery cuts were made in your legs: °? Avoid crossing your legs. °? Avoid sitting for long periods of time. Change positions every 30 minutes. °? Raise your legs when you are sitting. Place them on pillows. °· Wear stockings that help keep blood clots from forming in your legs (compression stockings). °· Only take sponge baths until your doctor says it is okay to take showers. Pat the surgery area dry. Do not rub the surgery area with a washcloth or towel. Do not bathe, swim, or use a hot tub until your doctor says it is okay. °· Eat foods that are high in fiber. These include raw fruits and vegetables, whole grains, beans, and nuts. Choose lean meats. Avoid canned, processed, and fried foods. °· Drink enough fluids to keep your pee (urine) clear or pale yellow. °· Weigh yourself every day. °· Rest and limit activity as told by your doctor. You may be told to: °? Stop any activity if you have chest pain, shortness of breath, changes in heartbeat, or dizziness. Get help right away if this happens. °? Move around often for short amounts of time or take short walks as told by your doctor. Gradually become more active. You may need help to strengthen your muscles and build endurance. °? Avoid lifting, pushing, or pulling anything heavier than 10 pounds (4.5 kg) for at least 6 weeks after surgery. °· Do not drive until your doctor says it is okay. °· Ask your doctor when you can go back to work. °· Ask your doctor when you can begin sexual activity again. °· Follow up with your doctor as told. °Contact a doctor if: °· You have puffiness, redness, more pain, or fluid draining from the incision site. °· You have a fever. °· You have puffiness in your  ankles or legs. °· You have pain in your legs. °· You gain 2 or more pounds (0.9 kg) a day. °· You feel sick to your stomach (nauseous) or throw up (vomit). °· You have watery poop (diarrhea). °Get help right away if: °· You have chest pain that goes to your jaw or arms. °· You have shortness of breath. °· You have a fast or irregular heartbeat. °· You notice a "clicking" in your breastbone when you move. °· You have numbness or weakness in your arms or legs. °· You feel dizzy or light-headed. °This information is not intended to replace advice given to you by your health care provider. Make sure you discuss any questions you have with your health care provider. °Document Released: 02/13/2013 Document Revised: 07/17/2015 Document Reviewed: 07/18/2012 °Elsevier Interactive Patient Education © 2017 [employer]. ° °

## 2017-08-31 NOTE — Progress Notes (Signed)
CARDIAC REHAB PHASE I   PRE:  Rate/Rhythm: 99 SR    BP: sitting 143/72    SaO2:   MODE:  Ambulation: 370 ft   POST:  Rate/Rhythm: 110 ST    BP: sitting 127/67     SaO2: 89-90 RA  Pt assisted to bathroom without RW, quite unsteady and nervous. He needed verbal cues on correct mechanics. Used RW in hall, slow, steady walk. C/o slight dizziness and nausea in hall. SAO2 low on return to room. He was reluctant to sit in recliner. Had him do IS, can get to 1500-1700 mL. Encouraged more walking. Hope he will progress more to go home but he will still need someone to help with animals (he is thinking about all of this). [id number]   [place], ACSM 08/31/2017 10:16 AM

## 2017-08-31 NOTE — Discharge Summary (Signed)
Physician Discharge Summary  Patient ID: Cory Hess MRN: [id number] DOB/AGE: [birth year] 72 y.o.  Admit date: 08/27/2017 Discharge date: 09/02/2017  Admission Diagnoses: Acute onset chest pain with suspected ST elevation myocardial infarction and presumed new left bundle branch block.  Discharge Diagnoses:  Active Problems:   Hx of CABG   Coronary artery disease   Patient Active Problem List   Diagnosis Date Noted  . 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  . Coronary artery disease 08/28/2017  . 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 ([place])    History of the present illness: The patient is a 72 year old male with no prior cardiac history.  He reports that he does not see a primary care physician and is not aware of any medical problems.  He does not take any medications other than a multivitamin.  He lives by himself.  He does not smoke.  The patient reports intermittent episodes of exertional chest pain over the last few months.  However he had an acute onset of severe substernal chest pain 2 hours before presenting to the emergency department after calling EMS.  On arrival his blood pressure is noted to be 200/120.  He was also noted to be in mild respiratory distress with mild hypoxemia.  He was given nitroglycerin and I approximated improved with oxygen.  His EKG showed a left bundle branch block of unknown chronicity and there is no other EKG to compare to.  Due to these findings he was seen by cardiology who recommended proceeding emergently to the cardiac catheterization lab for possible PCI.   Discharged Condition: good  Hospital Course: Patient was taken to the cardiac catheterization lab where he was found to have diffuse left main and calcified thyroid disease with an 80% stenosis.  Proximal LAD was occluded.  The ostial to proximal left circumflex had a 95% stenosis.  The right coronary was diffusely diseased with a 60% proximal and 90% mid vessel stenosis.  The  ostium of the PDA had about 80% stenosis.  See the cardiac catheterization report for full details.  Due to these findings cardiothoracic surgical consultation was obtained with Arvid Right, MD who evaluated the patient and his studies and agree to recommendations to proceed with emergent surgical revascularization.  On 08/28/2017 he was taken to the operating room where he underwent the below described procedure.  He tolerated it well was taken to the surgical intensive care unit in stable condition.  Postoperative hospital course:  The patient is progressed well.  He was weaned from the ventilator using standard protocols without difficulty.  All routine lines monitors and drainage devices have been discontinued in the standard fashion.  He has remained neurologically intact.  He has maintained sinus rhythm with some PVCs and has been started on beta-blocker.  He has had some hypertension and has been started on ARB.  He is tolerating gradually increasing activities using standard cardiac rehab modalities.  Incisions are noted to be healing well without evidence of infection.  He does have some volume overload but is responding well to diuretics.  Renal function is within normal limits and most recent BUN and creatinine are 25/1.04 on 08/30/2017.  He does have an expected acute blood loss anemia values are stable.  Most recent hemoglobin hematocrit are 10.7/33.2 on 08/30/2017.  He is tolerating diet.  His sugars have been under adequate control and his hemoglobin A1c is 5.6.  The patient does not have anybody can stay with him initially and as he needs continued  rehabilitation is felt that he would best be suited to transfer for a short-term nursing home stay.  The time of discharge she is quite stable.  Consults: cardiology  Significant Diagnostic Studies: angiography: Cardiac catheterization  Treatments: surgery:                                                                          CARDIOVASCULAR  SURGERY OPERATIVE NOTE  08/28/2017  Surgeon:  Gaye Pollack, MD  First Assistant: Lars Pinks,  PA-C   Preoperative Diagnosis:  Severe left main and multi-vessel coronary artery disease s/p STEMI   Postoperative Diagnosis:  Same   Procedure: Emergent  1. Median Sternotomy 2. Extracorporeal circulation 3.   Coronary artery bypass grafting x 3   Left internal mammary graft to the LAD  SVG to OM  SVG to PDA   4.   Endoscopic vein harvest from the left leg   Anesthesia:  General Endotracheal    Discharge Exam: Blood pressure (!) 143/82, pulse 84, temperature 98.2 F (36.8 C), temperature source Oral, resp. rate 17, height 5\' 10"  (1.778 m), weight 107.1 kg (236 lb 1.6 oz), SpO2 93 %.   General appearance: alert, cooperative and no distress Heart: regular rate and rhythm and occ extrasystole Lungs: min dim in bases Abdomen: benign Extremities: + edema Wound: incis healing well    Disposition: Discharge disposition: 03-Skilled Nursing Facility       Discharge Instructions    Amb Referral to Cardiac Rehabilitation   Complete by:  As directed    Diagnosis:  CABG   CABG X ___:  3   Discharge patient   Complete by:  As directed    Discharge disposition:  03-Skilled Nursing Facility   Discharge patient date:  09/02/2017     Allergies as of 09/02/2017   No Known Allergies     Medication List    STOP taking these medications   Ferrous Sulfate 90 (18 Fe) MG Tabs   hydrocortisone cream 1 %   OVER THE COUNTER MEDICATION     TAKE these medications   aspirin 81 MG EC tablet Take 1 tablet (81 mg total) by mouth daily. Start taking on:  09/03/2017 What changed:    medication strength  how much to take  when to take this   atorvastatin 20 MG tablet Commonly known as:  LIPITOR Take 1 tablet (20 mg total) by mouth daily at 6 PM.   carvedilol 12.5 MG tablet Commonly known as:  COREG Take 1 tablet (12.5 mg total) by mouth 2  (two) times daily with a meal. Start taking on:  09/03/2017   clopidogrel 75 MG tablet Commonly known as:  PLAVIX Take 1 tablet (75 mg total) by mouth daily. Start taking on:  09/03/2017   losartan 50 MG tablet Commonly known as:  COZAAR Take 1 tablet (50 mg total) by mouth daily. Start taking on:  09/03/2017   multivitamin with minerals Tabs tablet Take 1 tablet by mouth daily. One a Day over 50   oxyCODONE 5 MG immediate release tablet Commonly known as:  Oxy IR/ROXICODONE Take 1-2 tablets (5-10 mg total) by mouth every 6 (six) hours as needed for up to 7 days for severe pain.  Follow-up Information    Gaye Pollack, MD Follow up.   Specialty:  Cardiothoracic Surgery Why:  Please see discharge paperwork for follow-up appointment with surgeon.  Also obtain a chest x-ray from [facility] 1/2-hour prior to this appointment.  [facility] is located in the same office complex on the first floor. Contact information: [address] [place] [address] [phone]        Theora Gianotti, NP Follow up on 09/12/2017.   Specialties:  Nurse Practitioner, Cardiology, Radiology Why:  Please arrive 15 minutes early for your 2pm Cardiology appointment Contact information: [place] [id number] [phone]          The patient has been discharged on:   1.Beta Blocker:  Yes Blue.Reese   ]                              No   [   ]                              If No, reason:  2.Ace Inhibitor/ARB: Yes [  y ]                                     No  [    ]                                     If No, reason:  3.Statin:   Yes [ y  ]                  No  [   ]                  If No, reason:  4.Ecasa:  Yes  [ y  ]                  No   [   ]                  If No, reason:  1. Please obtain vital signs at least one time daily 2.Please weigh the patient daily. If he or she continues to gain weight or develops lower  extremity edema, contact the office at [phone]. 3. Ambulate patient at least three times daily and please use sternal precautions. 4 please remove chest tube sutures in one week   Signed: [name] 09/02/2017, 3:10 PM

## 2017-08-31 NOTE — Progress Notes (Addendum)
[address]       [place] [address]             [phone]      4 Days Post-Op Procedure(s) (LRB): CORONARY ARTERY BYPASS GRAFTING (CABG) ON PUMP USING LEFT INTERNAL MAMMARY ARTERY AND LEFT GREATER SAPHENOUS VEIN VIA ENDOVEIN HARVEST (N/A) Subjective: Feels pretty well, ambulation improving, mild nausea- mild  Objective: Vital signs in last 24 hours: Temp:  [98 F (36.7 C)-99.2 F (37.3 C)] 98.4 F (36.9 C) (07/10 0404) Pulse Rate:  [87-129] 87 (07/10 0404) Cardiac Rhythm: Normal sinus rhythm (07/09 2000) Resp:  [19-30] 27 (07/10 0404) BP: (101-154)/(57-99) 150/85 (07/10 0404) SpO2:  [89 %-95 %] 93 % (07/10 0404) Weight:  [108.3 kg (238 lb 12.8 oz)] 108.3 kg (238 lb 12.8 oz) (07/10 0409)  Hemodynamic parameters for last 24 hours:    Intake/Output from previous day: 07/09 0701 - 07/10 0700 In: 610.8 [P.O.:600; I.V.:10.8] Out: 1400 [Urine:1400] Intake/Output this shift: No intake/output data recorded.  General appearance: alert, cooperative and no distress Heart: regular rate and rhythm Lungs: min dim in the bases Abdomen: benign Extremities: + BLE edema Wound: chest dressing CDI,  EVH healing well  Lab Results: Recent Labs    08/29/17 0600 08/29/17 1648 08/30/17 0343  WBC 16.0*  --  12.3*  HGB 11.9* 11.2* 10.7*  HCT 37.2* 33.0* 33.2*  PLT 111*  --  99*   BMET:  Recent Labs    08/29/17 0600 08/29/17 1648 08/30/17 0343  NA 140 138 138  K 4.2 4.1 3.8  CL 108 102 106  CO2 27  --  21*  GLUCOSE 122* 119* 111*  BUN 24* 27* 25*  CREATININE 1.35* 1.10 1.04  CALCIUM 8.5*  --  8.3*    PT/INR: No results for input(s): LABPROT, INR in the last 72 hours. ABG    Component Value Date/Time   PHART 7.325 (L) 08/28/2017 0732   HCO3 21.4 08/28/2017 0732   TCO2 25 08/29/2017 1648   ACIDBASEDEF 4.0 (H) 08/28/2017 0732   O2SAT 96.0 08/28/2017 0732   CBG (last 3)  Recent Labs    08/29/17 0741 08/29/17 1156 08/29/17 1540  GLUCAP  108* 99 112*    Meds Scheduled Meds: . aspirin EC  325 mg Oral Daily  . atorvastatin  20 mg Oral q1800  . clopidogrel  75 mg Oral Daily  . docusate sodium  200 mg Oral Daily  . enoxaparin (LOVENOX) injection  40 mg Subcutaneous QHS  . furosemide  40 mg Oral Daily  . mouth rinse  15 mL Mouth Rinse BID  . metoprolol tartrate  25 mg Oral BID  . moving right along book   Does not apply Once  . pantoprazole  40 mg Oral QAC breakfast  . potassium chloride  20 mEq Oral BID  . sodium chloride flush  3 mL Intravenous Q12H   Continuous Infusions: . sodium chloride     PRN Meds:.sodium chloride, acetaminophen, bisacodyl **OR** bisacodyl, ondansetron **OR** ondansetron (ZOFRAN) IV, oxyCODONE, sodium chloride flush, traMADol  Xrays Dg Chest Port 1 View  Result Date: 08/30/2017 CLINICAL DATA:  72 year old male with a history of CABG EXAM: PORTABLE CHEST 1 VIEW COMPARISON:  08/29/2017 FINDINGS: Cardiomediastinal silhouette unchanged with surgical changes of median sternotomy and CABG. Poorly defined heart borders with low lung volumes and patchy opacities at the lung bases. No pneumothorax. Unchanged right IJ sheath. Interval removal of pleural/mediastinal drains. Blunting of the left costophrenic angle.  IMPRESSION: Interval removal of mediastinal/pleural drains without evidence of pneumothorax. Low lung volumes with atelectasis, and likely small left pleural effusion. Unchanged right IJ sheath. Electronically Signed   By: Corrie Mckusick D.O.   On: 08/30/2017 08:53    Assessment/Plan: S/P Procedure(s) (LRB): CORONARY ARTERY BYPASS GRAFTING (CABG) ON PUMP USING LEFT INTERNAL MAMMARY ARTERY AND LEFT GREATER SAPHENOUS VEIN VIA ENDOVEIN HARVEST (N/A)   1 conts to progress well 2 hypertensive, creat stable - will add ACE-I 3 sinus rhythm with some PVC's, cont beta blocker 4 no new labs today 5 CBG's ok- HG A1c 5.6- nutritional management of pre diabetes 6 SW consult to assist with placement- has no  family who will stay with him currently- he is working on it but willing to go to SNF short term if needed 7 cont diuretic for volume oveload 8 he is on statin and can be up titrated over time as outpatient depending on response. Low HDL and elevated LDL(33/138) 9 push rehab and routine pulm toilet  LOS: 4 days    Cory Hess 08/31/2017   Chart reviewed, patient examined, agree with above. He is doing well overall. His recorded weights are all over the place so who knows what is accurate. Will diurese a little more.  He lives alone and has for 30-40 years with his 2 dogs, goats and chickens. He does not want to go to SNF and has neighbors who can check on him. His brother and sisters can check on him but not stay with him and he is fine with that. I think he could go home with [facility] to check on him as long as he is moving around well here. He is only POD 3.

## 2017-09-01 MED ORDER — METOPROLOL SUCCINATE ER 50 MG PO TB24
75.0000 mg | ORAL_TABLET | Freq: Every day | ORAL | Status: DC
  Administered 2017-09-02: 75 mg via ORAL
  Filled 2017-09-01 (×2): qty 1

## 2017-09-01 MED ORDER — ASPIRIN EC 81 MG PO TBEC
81.0000 mg | DELAYED_RELEASE_TABLET | Freq: Every day | ORAL | Status: DC
  Administered 2017-09-01 – 2017-09-02 (×2): 81 mg via ORAL
  Filled 2017-09-01 (×2): qty 1

## 2017-09-01 MED ORDER — METOPROLOL TARTRATE 25 MG PO TABS
37.5000 mg | ORAL_TABLET | Freq: Two times a day (BID) | ORAL | Status: AC
  Administered 2017-09-01 (×2): 37.5 mg via ORAL
  Filled 2017-09-01 (×2): qty 1

## 2017-09-01 NOTE — Progress Notes (Addendum)
      [address]       [place] [address]             [phone]        5 Days Post-Op Procedure(s) (LRB): CORONARY ARTERY BYPASS GRAFTING (CABG) ON PUMP USING LEFT INTERNAL MAMMARY ARTERY AND LEFT GREATER SAPHENOUS VEIN VIA ENDOVEIN HARVEST (N/A)  Subjective: Patient with loose stools.  Objective: Vital signs in last 24 hours: Temp:  [98.4 F (36.9 C)-98.5 F (36.9 C)] 98.5 F (36.9 C) (07/11 0451) Pulse Rate:  [86-95] 86 (07/11 0451) Cardiac Rhythm: Normal sinus rhythm;Bundle branch block (07/10 1914) Resp:  [21] 21 (07/11 0451) BP: (129-150)/(69-75) 147/69 (07/11 0451) SpO2:  [95 %-96 %] 95 % (07/11 0451) Weight:  [236 lb 12.4 oz (107.4 kg)] 236 lb 12.4 oz (107.4 kg) (07/11 0451)  Pre op weight 104.3 kg Current Weight  09/01/17 236 lb 12.4 oz (107.4 kg)       Intake/Output from previous day: 07/10 0701 - 07/11 0700 In: 480 [P.O.:480] Out: 250 [Urine:250]   Physical Exam:  Cardiovascular: RRR Pulmonary: Slightly diminished at bases Abdomen: Soft, non tender, bowel sounds present. Extremities: Mild bilateral lower extremity edema. Wounds: Clean and dry.  No erythema or signs of infection.  Lab Results: CBC: Recent Labs    08/29/17 1648 08/30/17 0343  WBC  --  12.3*  HGB 11.2* 10.7*  HCT 33.0* 33.2*  PLT  --  99*   BMET:  Recent Labs    08/29/17 1648 08/30/17 0343  NA 138 138  K 4.1 3.8  CL 102 106  CO2  --  21*  GLUCOSE 119* 111*  BUN 27* 25*  CREATININE 1.10 1.04  CALCIUM  --  8.3*    PT/INR:  Lab Results  Component Value Date   INR 1.34 08/28/2017   INR 0.97 08/27/2017   ABG:  INR: Will add last result for INR, ABG once components are confirmed Will add last 4 CBG results once components are confirmed  Assessment/Plan:  1. CV - SR, ST at times. He did have a run of ?VT earlier this am. On Lopressor 25 mg bid, Lisinopril 20 mg daily, Plavix 75 mg. Will decrease ecasa to 81 mg daily and increase Lopressor to  37.5 mg bid. 2.  Pulmonary - On room air. Encourage incentive spirometer.  3. Volume Overload - On Lasix 40 mg daily 4.  Acute blood loss anemia - H and H 10.7 and 33.2 5. Thrombocytopenia-platelets 99,000 6. Stop stool softeners 7. He is still a bit unsteady on his feet. Would likely benefit from SNF. His sisters are in agreement and his brother will take care of the animals until he returns home.  Donielle M ZimmermanPA-C 09/01/2017,7:11 AM [phone]   Chart reviewed, patient examined, agree with above. He is making progress on POD 4. Seen by PT and OT and SNF recommended at discharge. Social work is working on that. He is agreeable to go to SNF. Lopressor switched to succinate. He is on lisinopril and may need to increase dose. Cardiology recommended considering switching to losartan or Entresto but I don't think he will be able to afford Entresto. I would send home on lisinopril and he will need cardiology follow up in [place].

## 2017-09-01 NOTE — Evaluation (Signed)
Occupational Therapy Evaluation Patient Details Name: Cory Hess MRN: [id number] DOB: [birth year] Today's Date: 09/01/2017    History of Present Illness Pt is a 72 y.o. male admitted with STEMI now s/p emergent CABG on 08/27/17. No pertinent PMH on file.    Clinical Impression   Pt reports he was independent with ADL PTA. Currently pt requires supervision for functional mobility in room and min-mod assist for ADL with cues throughout functional tasks to maintain sternal precautions. Recommending SNF for follow up to maximize independence and safety with ADL and functional mobility prior to return home alone. Pt would benefit from continued skilled OT to address established goals.    Follow Up Recommendations  SNF    Equipment Recommendations  3 in 1 bedside commode    Recommendations for Other Services       Precautions / Restrictions Precautions Precautions: Fall;Sternal Precaution Comments: Reviewed sternal precautions with pt Restrictions Weight Bearing Restrictions: No Other Position/Activity Restrictions: Sternal      Mobility Bed Mobility Overal bed mobility: Needs Assistance Bed Mobility: Sit to Supine       Sit to supine: Min assist   General bed mobility comments: for LEs back into bed  Transfers Overall transfer level: Needs assistance Equipment used: Rolling walker (2 wheeled) Transfers: Sit to/from Stand Sit to Stand: Supervision         General transfer comment: for safety, cues for hand placement    Balance Overall balance assessment: Needs assistance Sitting-balance support: No upper extremity supported;Feet supported Sitting balance-Leahy Scale: Fair     Standing balance support: No upper extremity supported;During functional activity Standing balance-Leahy Scale: Fair                             ADL either performed or assessed with clinical judgement   ADL Overall ADL's : Needs assistance/impaired Eating/Feeding: Set up    Grooming: Supervision/safety;Standing;Wash/dry hands   Upper Body Bathing: Minimal assistance;Sitting   Lower Body Bathing: Moderate assistance;Sit to/from stand   Upper Body Dressing : Minimal assistance;Standing Upper Body Dressing Details (indicate cue type and reason): to don second gown Lower Body Dressing: Moderate assistance;Sit to/from stand   Toilet Transfer: Supervision/safety;Ambulation;RW Toilet Transfer Details (indicate cue type and reason): Pt getting off toilet upon arrival         Functional mobility during ADLs: Supervision/safety;Rolling walker General ADL Comments: Requires cues to maintain sternal precautions during functional activities     Vision         Perception     Praxis      Pertinent Vitals/Pain Pain Assessment: Faces Pain Score: 2  Faces Pain Scale: Hurts little more Pain Location: Sternal incision Pain Descriptors / Indicators: Pressure;Sore Pain Intervention(s): Monitored during session;Repositioned;Patient requesting pain meds-RN notified     Hand Dominance Right   Extremity/Trunk Assessment Upper Extremity Assessment Upper Extremity Assessment: Overall WFL for tasks assessed   Lower Extremity Assessment Lower Extremity Assessment: Defer to PT evaluation       Communication Communication Communication: No difficulties   Cognition Arousal/Alertness: Awake/alert Behavior During Therapy: WFL for tasks assessed/performed Overall Cognitive Status: Within Functional Limits for tasks assessed                                     General Comments      Exercises     Shoulder Instructions  Home Living Family/patient expects to be discharged to:: Private residence Living Arrangements: Alone Available Help at Discharge: Family;Available PRN/intermittently Type of Home: House Home Access: Stairs to enter [employer] of Steps: 6-7 Entrance Stairs-Rails: Right;Left;Can reach both Home Layout: One  level     Bathroom Shower/Tub: Teacher, early years/pre: Standard     Home Equipment: None   Additional Comments: Has farm animals      Prior Functioning/Environment Level of Independence: Independent        Comments: Indep taking care of farm animals. Driving.         OT Problem List: Decreased strength;Decreased activity tolerance;Impaired balance (sitting and/or standing);Decreased knowledge of use of DME or AE;Decreased knowledge of precautions;Pain      OT Treatment/Interventions: Self-care/ADL training;Energy conservation;DME and/or AE instruction;Therapeutic activities;Patient/family education;Balance training    OT Goals(Current goals can be found in the care plan section) Acute Rehab OT Goals Patient Stated Goal: Get stronger before going home OT Goal Formulation: With patient Time For Goal Achievement: 09/15/17 Potential to Achieve Goals: Good ADL Goals Pt Will Perform Upper Body Bathing: with modified independence;sitting Pt Will Perform Lower Body Bathing: with modified independence;sit to/from stand Pt Will Transfer to Toilet: with modified independence;ambulating;bedside commode Pt Will Perform Toileting - Clothing Manipulation and hygiene: with modified independence;sit to/from stand Pt Will Perform Tub/Shower Transfer: Tub transfer;with modified independence;ambulating;3 in 1;rolling walker Additional ADL Goal #1: Pt will independently verbally recall sternal precautions and maintain throughout ADL.  OT Frequency: Min 2X/week   Barriers to D/C: Decreased caregiver support  pt lives alone       Co-evaluation              AM-PAC PT "6 Clicks" Daily Activity     Outcome Measure Help from another person eating meals?: None Help from another person taking care of personal grooming?: A Little Help from another person toileting, which includes using toliet, bedpan, or urinal?: A Little Help from another person bathing (including washing,  rinsing, drying)?: A Lot Help from another person to put on and taking off regular upper body clothing?: A Little Help from another person to put on and taking off regular lower body clothing?: A Lot 6 Click Score: 17   End of Session Equipment Utilized During Treatment: Gait belt;Rolling walker Nurse Communication: Patient requests pain meds  Activity Tolerance: Patient tolerated treatment well Patient left: in bed;with call bell/phone within reach  OT Visit Diagnosis: Unsteadiness on feet (R26.81);Pain Pain - part of body: (chest)                Time: 5852-7782 OT Time Calculation (min): 17 min Charges:  OT General Charges $OT Visit: 1 Visit OT Evaluation $OT Eval Moderate Complexity: 1 Mod G-Codes:     Diarra Ceja A. Ulice Brilliant, M.S., OTR/L Acute Rehab Department: [phone]  Binnie Kand 09/01/2017, 11:11 AM

## 2017-09-01 NOTE — Care Management Important Message (Signed)
Important Message  Patient Details  Name: Cory Hess MRN: [id number] Date of Birth: [birth year]   Medicare Important Message Given:  Yes Patient asleep, unsigned copy left at bedside  Delorse Lek 09/01/2017, 3:27 PM

## 2017-09-01 NOTE — Progress Notes (Signed)
CARDIAC REHAB PHASE I   PRE:  Rate/Rhythm: 87 SR with PVCs  BP:  Sitting: 137/83      SaO2: 94 RA  MODE:  Ambulation: 470 ft   POST:  Rate/Rhythm: 99 SR with PVCs  BP:  Sitting: 124/70    SaO2: 92 RA   Pt ambulated 457ft in hallway assist of one with front wheel rolling walker. Pt took 2 very short standing rest breaks. Pt returned to recliner. Pt educated on IS, and sternal precautions. In-the-tube sheet given along with heart healthy diet, and cardiac surgery booklet. Pt verbalizes understanding of need for showering and monitoring incisions daily and instructed on when to call MD. Reviewed restrictions and exercise guidelines with pt. Call bell and phone within reach. Will continue to follow.  [phone] Cory Falco, RN BSN 09/01/2017 2:11 PM

## 2017-09-01 NOTE — Evaluation (Signed)
Physical Therapy Evaluation Patient Details Name: Cory Hess MRN: [id number] DOB: 07/18/1945 Today's Date: 09/01/2017   History of Present Illness  Pt is a 72 y.o. male admitted with STEMI now s/p emergent CABG on 08/27/17. No pertinent PMH on file.   Clinical Impression  Pt presents with an overall decrease in functional mobility secondary to above. PTA, pt indep and lives alone. Educ on precautions, current condition, and importance of mobility. Today, pt required intermittent minA to prevent LOB while ambulating; ambulates with RW and min guard. Slowed gait speed puts pt at increased risk for falls. Pt also with lack of caregiver/family support. Pt would benefit from continued acute PT services to maximize functional mobility and independence prior to d/c with short-term SNF-level therapies.     Follow Up Recommendations SNF;Supervision for mobility/OOB    Equipment Recommendations  Rolling walker with 5" wheels    Recommendations for Other Services OT consult     Precautions / Restrictions Precautions Precautions: Fall;Sternal Restrictions Weight Bearing Restrictions: Yes(sternal precautions) Other Position/Activity Restrictions: Sternal      Mobility  Bed Mobility Overal bed mobility: Modified Independent                Transfers Overall transfer level: Needs assistance Equipment used: Rolling walker (2 wheeled) Transfers: Sit to/from Stand Sit to Stand: Supervision            Ambulation/Gait Ambulation/Gait assistance: Min assist;Min guard Gait Distance (Feet): (hallway) Assistive device: None;Rolling walker (2 wheeled) Gait Pattern/deviations: Step-through pattern;Decreased stride length Gait velocity: Decreased Gait velocity interpretation: <1.8 ft/sec, indicate of risk for recurrent falls General Gait Details: Intermittent minA to maintain balance while ambulating with no DME, pt reaching to rail for UE support throughout. Min guard with  RW  Stairs            Wheelchair Mobility    Modified Rankin (Stroke Patients Only)       Balance Overall balance assessment: Needs assistance   Sitting balance-Leahy Scale: Fair       Standing balance-Leahy Scale: Fair                               Pertinent Vitals/Pain Pain Assessment: 0-10 Pain Score: 2  Pain Location: Sternal incision Pain Descriptors / Indicators: Pressure;Sore Pain Intervention(s): Limited activity within patient's tolerance;Monitored during session    [place] expects to be discharged to:: Private residence Living Arrangements: Alone Available Help at Discharge: Family;Available PRN/intermittently Type of Home: House Home Access: Stairs to enter Entrance Stairs-Rails: Right;Left;Can reach both Entrance Stairs-Number of Steps: 6-7 Home Layout: One level Home Equipment: None Additional Comments: Has farm animals    Prior Function Level of Independence: Independent         Comments: Indep taking care of farm animals. Driving.      Hand Dominance   Dominant Hand: Right    Extremity/Trunk Assessment   Upper Extremity Assessment Upper Extremity Assessment: Overall WFL for tasks assessed    Lower Extremity Assessment Lower Extremity Assessment: Generalized weakness       Communication   Communication: No difficulties  Cognition Arousal/Alertness: Awake/alert Behavior During Therapy: WFL for tasks assessed/performed Overall Cognitive Status: Within Functional Limits for tasks assessed                                        General Comments  General comments (skin integrity, edema, etc.): SpO2 90-91% on RA while ambulating    Exercises     Assessment/Plan    PT Assessment Patient needs continued PT services  PT Problem List Decreased strength;Decreased activity tolerance;Decreased balance;Decreased mobility;Decreased knowledge of use of DME;Decreased knowledge of  precautions       PT Treatment Interventions DME instruction;Gait training;Stair training;Functional mobility training;Therapeutic activities;Therapeutic exercise;Balance training;Patient/family education    PT Goals (Current goals can be found in the Care Plan section)  Acute Rehab PT Goals Patient Stated Goal: Get stronger before going home PT Goal Formulation: With patient Time For Goal Achievement: 09/15/17 Potential to Achieve Goals: Good    Frequency Min 3X/week   Barriers to discharge Decreased caregiver support      Co-evaluation               AM-PAC PT "6 Clicks" Daily Activity  Outcome Measure Difficulty turning over in bed (including adjusting bedclothes, sheets and blankets)?: A Little Difficulty moving from lying on back to sitting on the side of the bed? : A Little Difficulty sitting down on and standing up from a chair with arms (e.g., wheelchair, bedside commode, etc,.)?: A Little Help needed moving to and from a bed to chair (including a wheelchair)?: A Little Help needed walking in hospital room?: A Little Help needed climbing 3-5 steps with a railing? : A Lot 6 Click Score: 17    End of Session Equipment Utilized During Treatment: Gait belt Activity Tolerance: Patient tolerated treatment well Patient left: Other (comment)(with OT) Nurse Communication: Mobility status PT Visit Diagnosis: Other abnormalities of gait and mobility (R26.89)    Time: 0370-4888 PT Time Calculation (min) (ACUTE ONLY): 20 min   Charges:   PT Evaluation $PT Eval Moderate Complexity: 1 Mod     PT G Codes:       Mabeline Caras, PT, DPT Acute Rehab Services  Pager: [place] 09/01/2017, 9:05 AM

## 2017-09-01 NOTE — Clinical Social Work Note (Signed)
Clinical Social Work Assessment  Patient Details  Name: Cory Hess MRN: [id number] Date of Birth: 1945-03-22  Date of referral:  09/01/17               Reason for consult:  Discharge Planning, Facility Placement                Permission sought to share information with:  Chartered certified accountant granted to share information::  Yes, Verbal Permission Granted  Name::        Agency::  snf  Relationship::     Contact Information:     Housing/Transportation Living arrangements for the past 2 months:  Single Family Home Source of Information:  Patient Patient Interpreter Needed:  None Criminal Activity/Legal Involvement Pertinent to Current Situation/Hospitalization:  No - Comment as needed Significant Relationships:  Siblings, Pets Lives with:  Self Do you feel safe going back to the place where you live?  Yes Need for family participation in patient care:  No (Coment)  Care giving concerns:  No family at bedside. Patient has support from siblings in the area. Patient stated his siblings will be taking care of his animals while he attends rehab   Social Worker assessment / plan:  CSW met patient at bedside to discuss discharge plan. Patient stated he is agreeable to go to rehab but would prefer rehab facility to be as close to [facility] as possible or to be in the [place] area. Patient stated he has never been in a rehab facility before in the past. CSW went over process of going to a SNF. CSW to follow up with patients once bed offers are available   Employment status:  Retired Forensic scientist:  Medicare PT Recommendations:  [place] / Referral to community resources:  [place]  Patient/Family's Response to care:  Patient appreciates CSW role in care. Patient extremely pleasant during this process and eager to be back home with his pets  Patient/Family's Understanding of and Emotional Response to Diagnosis,  Current Treatment, and Prognosis:  Patient agreeable with discharge plan to rehab Emotional Assessment Appearance:  Appears younger than stated age Attitude/Demeanor/Rapport:  Engaged Affect (typically observed):  Accepting Orientation:  Oriented to Self, Oriented to Place, Oriented to  Time, Oriented to Situation Alcohol / Substance use:  Not Applicable Psych involvement (Current and /or in the community):  No (Comment)  Discharge Needs  Concerns to be addressed:  Care Coordination Readmission within the last 30 days:  No Current discharge risk:  Dependent with Mobility Barriers to Discharge:  Continued Medical Work up   [employer], LCSW 09/01/2017, 4:42 PM

## 2017-09-01 NOTE — Progress Notes (Signed)
Progress Note  Patient Name: Cory Hess Date of Encounter: 09/01/2017  Primary Cardiologist: No primary care provider on file.   Subjective   Feeling well.  No chest pain or shortness of breath.  Tired after his walk this morning.  Inpatient Medications    Scheduled Meds: . aspirin EC  81 mg Oral Daily  . atorvastatin  20 mg Oral q1800  . clopidogrel  75 mg Oral Daily  . enoxaparin (LOVENOX) injection  40 mg Subcutaneous QHS  . lisinopril  20 mg Oral Daily  . mouth rinse  15 mL Mouth Rinse BID  . metoprolol tartrate  37.5 mg Oral BID  . moving right along book   Does not apply Once  . pantoprazole  40 mg Oral QAC breakfast  . potassium chloride  20 mEq Oral BID  . sodium chloride flush  3 mL Intravenous Q12H   Continuous Infusions: . sodium chloride     PRN Meds: sodium chloride, acetaminophen, ondansetron **OR** ondansetron (ZOFRAN) IV, oxyCODONE, sodium chloride flush, traMADol   Vital Signs    Vitals:   08/31/17 0840 08/31/17 1938 09/01/17 0451 09/01/17 0730  BP: (!) 150/75 129/70 (!) 147/69 (!) 150/81  Pulse: 93 95 86 92  Resp:  (!) 21 (!) 21 (!) 24  Temp:  98.4 F (36.9 C) 98.5 F (36.9 C) 98 F (36.7 C)  TempSrc:  Oral Oral Oral  SpO2:  96% 95% 91%  Weight:   236 lb 12.4 oz (107.4 kg)   Height:        Intake/Output Summary (Last 24 hours) at 09/01/2017 1001 Last data filed at 09/01/2017 0400 Gross per 24 hour  Intake 360 ml  Output -  Net 360 ml   Filed Weights   08/30/17 0600 08/31/17 0409 09/01/17 0451  Weight: 209 lb 7 oz (95 kg) 238 lb 12.8 oz (108.3 kg) 236 lb 12.4 oz (107.4 kg)    Telemetry    Sinus rhythm.  PVCs.  Rate related aberrancy. - Personally Reviewed  ECG    n/a - Personally Reviewed  Physical Exam   VS:  BP (!) 150/81 (BP Location: Right Arm)   Pulse 92   Temp 98 F (36.7 C) (Oral)   Resp (!) 24   Ht 5\' 10"  (1.778 m)   Wt 236 lb 12.4 oz (107.4 kg)   SpO2 91%   BMI 33.97 kg/m  , BMI Body mass index is 33.97  kg/m. GENERAL:  Well appearing.  No acute distress.  HEENT: Pupils equal round and reactive, fundi not visualized, oral mucosa unremarkable NECK:  No jugular venous distention, waveform within normal limits, carotid upstroke brisk and symmetric, no bruits CHEST: Midline incision C/D/I LUNGS:  Clear to auscultation bilaterally HEART:  RRR.  PMI not displaced or sustained,S1 and S2 within normal limits, no S3, no S4, no clicks, no rubs, no murmurs ABD:  Flat, positive bowel sounds normal in frequency in pitch, no bruits, no rebound, no guarding, no midline pulsatile mass, no hepatomegaly, no splenomegaly EXT:  2 plus pulses throughout, 1+ LE edema, no cyanosis no clubbing SKIN:  No rashes no nodules NEURO:  Cranial nerves II through XII grossly intact, motor grossly intact throughout PSYCH:  Cognitively intact, oriented to person place and time   Labs    Chemistry Recent Labs  Lab 08/27/17 1642  08/28/17 0227  08/28/17 1507  08/29/17 0600 08/29/17 1648 08/30/17 0343  NA 143   < > 142   < >  --    < >  140 138 138  K 3.9   < > 4.6   < >  --    < > 4.2 4.1 3.8  CL 111   < > 114*   < >  --    < > 108 102 106  CO2 24  --  24  --   --   --  27  --  21*  GLUCOSE 151*   < > 139*   < >  --    < > 122* 119* 111*  BUN 17   < > 14   < >  --    < > 24* 27* 25*  CREATININE 1.34*   < > 1.24   < > 1.20   < > 1.35* 1.10 1.04  CALCIUM 9.0  --  8.5*  --   --   --  8.5*  --  8.3*  PROT 7.3  --   --   --   --   --   --   --   --   ALBUMIN 4.2  --   --   --   --   --   --   --   --   AST 39  --   --   --   --   --   --   --   --   ALT 25  --   --   --   --   --   --   --   --   ALKPHOS 58  --   --   --   --   --   --   --   --   BILITOT 1.0  --   --   --   --   --   --   --   --   GFRNONAA 52*  --  57*   < > 60*  --  52*  --  >60  GFRAA >60  --  >60   < > >60  --  60*  --  >60  ANIONGAP 8  --  4*  --   --   --  5  --  11   < > = values in this interval not displayed.     Hematology Recent Labs   Lab 08/28/17 1507  08/29/17 0600 08/29/17 1648 08/30/17 0343  WBC 16.4*  --  16.0*  --  12.3*  RBC 4.15*  --  3.98*  --  3.55*  HGB 12.3*   < > 11.9* 11.2* 10.7*  HCT 38.0*   < > 37.2* 33.0* 33.2*  MCV 91.6  --  93.5  --  93.5  MCH 29.6  --  29.9  --  30.1  MCHC 32.4  --  32.0  --  32.2  RDW 13.2  --  13.6  --  13.1  PLT 121*  --  111*  --  99*   < > = values in this interval not displayed.    Cardiac Enzymes Recent Labs  Lab 08/27/17 1642  TROPONINI 0.60*   No results for input(s): TROPIPOC in the last 168 hours.   BNPNo results for input(s): BNP, PROBNP in the last 168 hours.   DDimer No results for input(s): DDIMER in the last 168 hours.   Radiology    No results found.  Cardiac Studies   LHC 08/27/17:   Ost LM to Dist LM lesion is 80% stenosed.  Prox LAD lesion is 100% stenosed.  Ost Cx to Prox Cx lesion is 95% stenosed.  Prox RCA lesion is 60% stenosed.  Mid RCA lesion is 90% stenosed.  Ost RPDA to RPDA lesion is 80% stenosed.   1.  Suspected ST elevation myocardial infarction with presumed new left bundle branch block.  Severe left main stenosis which appears hazy suggestive of plaque rupture with chronic occlusion of mid LAD with collaterals from the right coronary artery, severe ostial left circumflex stenosis and severe RCA stenosis. 2.  Mildly elevated left ventricular end-diastolic pressure  at 17 mmHg.   3.  The LAD was probed with 2 wires and could not cross the occlusion which favors that this is a chronic occlusion. 4.  Successful intra-aortic balloon pump placement via the right common femoral artery.   Patient Profile     72 y.o. male with no known prior medical history (doesn't see MD regularly) who presented 08/27/17 with chest pain and BBB of unknown chronicity.  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  Underwent CABG 08/27/17.   Assessment & Plan    # CAD s/p CABG:  Mr. Forgette is doing well post op.  He had 3v CABG  (LIMA-->LAD, SVG-->OM, SVG-->PDA).  Continue aspirin, clopidogrel and atorvastatin.    # Acute systolic and diastolic heart failure: # Hypertension: Intraop TEE reviewed.  LVEF 30-35% with septal, anterior and inferior hypokinesis.  Apical akiniesis.  Recommend complete echo in 3 months.  Continue lisinopril.  Consider switching to losartan with plans to use Entresto.  Will switch metoprolol to succinate starting tomorrow. BP poorly controlled, but metoprolol was increased today and last BP 326 systolic.  Continue furosemide.    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  No evidence of VT.  Continue metoprolol and switch to succinate as above.     [facility] will sign off.   Medication Recommendations:  Switch metoprolol to succinate given reduced LVEF.  Consider losartan/Entresto. Other recommendations (labs, testing, etc):  Echo in 3 months Follow up as an outpatient:  09/12/17  For questions or updates, please contact [place] Please consult [email or web address] for contact info under Cardiology/STEMI.      Signed, Skeet Latch, MD  09/01/2017, 10:01 AM

## 2017-09-01 NOTE — Progress Notes (Signed)
Pacing wires removed by Derry Skill RN and [facility] RN as ordered patient now on bedrest will monitor.

## 2017-09-02 DIAGNOSIS — R279 Unspecified lack of coordination: Secondary | ICD-10-CM | POA: Diagnosis not present

## 2017-09-02 DIAGNOSIS — Z951 Presence of aortocoronary bypass graft: Secondary | ICD-10-CM | POA: Diagnosis not present

## 2017-09-02 DIAGNOSIS — I10 Essential (primary) hypertension: Secondary | ICD-10-CM | POA: Diagnosis not present

## 2017-09-02 DIAGNOSIS — I5041 Acute combined systolic (congestive) and diastolic (congestive) heart failure: Secondary | ICD-10-CM | POA: Diagnosis not present

## 2017-09-02 DIAGNOSIS — E569 Vitamin deficiency, unspecified: Secondary | ICD-10-CM | POA: Diagnosis not present

## 2017-09-02 DIAGNOSIS — I25810 Atherosclerosis of coronary artery bypass graft(s) without angina pectoris: Secondary | ICD-10-CM | POA: Diagnosis not present

## 2017-09-02 DIAGNOSIS — I2510 Atherosclerotic heart disease of native coronary artery without angina pectoris: Secondary | ICD-10-CM | POA: Diagnosis not present

## 2017-09-02 DIAGNOSIS — Z743 Need for continuous supervision: Secondary | ICD-10-CM | POA: Diagnosis not present

## 2017-09-02 DIAGNOSIS — R262 Difficulty in walking, not elsewhere classified: Secondary | ICD-10-CM | POA: Diagnosis not present

## 2017-09-02 DIAGNOSIS — R079 Chest pain, unspecified: Secondary | ICD-10-CM | POA: Diagnosis not present

## 2017-09-02 DIAGNOSIS — R0789 Other chest pain: Secondary | ICD-10-CM | POA: Diagnosis not present

## 2017-09-02 DIAGNOSIS — Z5189 Encounter for other specified aftercare: Secondary | ICD-10-CM | POA: Diagnosis not present

## 2017-09-02 DIAGNOSIS — E785 Hyperlipidemia, unspecified: Secondary | ICD-10-CM | POA: Diagnosis not present

## 2017-09-02 DIAGNOSIS — R0602 Shortness of breath: Secondary | ICD-10-CM | POA: Diagnosis not present

## 2017-09-02 DIAGNOSIS — Y840 Cardiac catheterization as the cause of abnormal reaction of the patient, or of later complication, without mention of misadventure at the time of the procedure: Secondary | ICD-10-CM | POA: Diagnosis not present

## 2017-09-02 DIAGNOSIS — M6281 Muscle weakness (generalized): Secondary | ICD-10-CM | POA: Diagnosis not present

## 2017-09-02 MED ORDER — OXYCODONE HCL 5 MG PO TABS: 5.0000 mg | ORAL_TABLET | Freq: Four times a day (QID) | ORAL | 0 refills | Status: AC | PRN

## 2017-09-02 MED ORDER — CLOPIDOGREL BISULFATE 75 MG PO TABS: 75.0000 mg | ORAL_TABLET | Freq: Every day | ORAL | Status: DC

## 2017-09-02 MED ORDER — AMLODIPINE BESYLATE 5 MG PO TABS
5.0000 mg | ORAL_TABLET | Freq: Every day | ORAL | Status: DC
  Administered 2017-09-02: 5 mg via ORAL
  Filled 2017-09-02: qty 1

## 2017-09-02 MED ORDER — CARVEDILOL 12.5 MG PO TABS: 12.5000 mg | ORAL_TABLET | Freq: Two times a day (BID) | ORAL | Status: DC

## 2017-09-02 MED ORDER — ATORVASTATIN CALCIUM 20 MG PO TABS: 20.0000 mg | ORAL_TABLET | Freq: Every day | ORAL | 1 refills | Status: DC

## 2017-09-02 MED ORDER — LOSARTAN POTASSIUM 50 MG PO TABS: 50.0000 mg | ORAL_TABLET | Freq: Every day | ORAL | Status: DC

## 2017-09-02 MED ORDER — LOSARTAN POTASSIUM 50 MG PO TABS
50.0000 mg | ORAL_TABLET | Freq: Every day | ORAL | Status: DC
Start: 2017-09-03 — End: 2017-09-02

## 2017-09-02 MED ORDER — ASPIRIN 81 MG PO TBEC: 81.0000 mg | DELAYED_RELEASE_TABLET | Freq: Every day | ORAL | Status: DC

## 2017-09-02 MED ORDER — CARVEDILOL 12.5 MG PO TABS
12.5000 mg | ORAL_TABLET | Freq: Two times a day (BID) | ORAL | Status: DC
Start: 2017-09-03 — End: 2017-09-02

## 2017-09-02 NOTE — Care Management Note (Signed)
Case Management Note Marvetta Gibbons RN, BSN Unit 4E-Case Manager [phone]  Patient Details  Name: Cory Hess MRN: [id number] Date of Birth: 04-12-1945  Subjective/Objective:   Pt admitted s/p CABG                 Action/Plan: PTA pt lived at home alone, per PT eval recommendation for STSNF, CSW consulted for placement needs  Expected Discharge Date:  09/02/17               Expected Discharge Plan:  Skilled Nursing Facility  In-House Referral:  Clinical Social Work  Discharge planning Services  CM Consult  Post Acute Care Choice:  NA Choice offered to:  NA  DME Arranged:    DME Agency:     HH Arranged:    [place] Agency:     Status of Service:  Completed, signed off  If discussed at [employer], dates discussed:    Discharge Disposition: home/home health   Additional Comments:  09/02/17- 1545- Marvetta Gibbons RN, CM- pt for discharge today- CSW has bed available and following for transition of care needs  Dawayne Patricia, RN 09/02/2017, 3:46 PM

## 2017-09-02 NOTE — Progress Notes (Addendum)
      [address]       [place] [address]             [phone]      6 Days Post-Op Procedure(s) (LRB): CORONARY ARTERY BYPASS GRAFTING (CABG) ON PUMP USING LEFT INTERNAL MAMMARY ARTERY AND LEFT GREATER SAPHENOUS VEIN VIA ENDOVEIN HARVEST (N/A) Subjective: Feels well, some loose stools from stool softeners, ambulation conts to improve. SW is working on short term placement and currently he is agreeable  Objective: Vital signs in last 24 hours: Temp:  [98 F (36.7 C)-98.5 F (36.9 C)] 98.2 F (36.8 C) (07/12 0710) Pulse Rate:  [56-96] 84 (07/12 0710) Cardiac Rhythm: Sinus tachycardia (07/11 2300) Resp:  [17-32] 17 (07/12 0710) BP: (132-158)/(72-84) 143/82 (07/12 0710) SpO2:  [90 %-95 %] 93 % (07/12 0710) Weight:  [107.1 kg (236 lb 1.6 oz)] 107.1 kg (236 lb 1.6 oz) (07/12 0219)  Hemodynamic parameters for last 24 hours:    Intake/Output from previous day: 07/11 0701 - 07/12 0700 In: 720 [P.O.:720] Out: 200 [Urine:200] Intake/Output this shift: No intake/output data recorded.  General appearance: alert, cooperative and no distress Heart: regular rate and rhythm and occ extrasystole Lungs: min dim in bases Abdomen: benign Extremities: + edema Wound: incis healing well  Lab Results: No results for input(s): WBC, HGB, HCT, PLT in the last 72 hours. BMET: No results for input(s): NA, K, CL, CO2, GLUCOSE, BUN, CREATININE, CALCIUM in the last 72 hours.  PT/INR: No results for input(s): LABPROT, INR in the last 72 hours. ABG    Component Value Date/Time   PHART 7.325 (L) 08/28/2017 0732   HCO3 21.4 08/28/2017 0732   TCO2 25 08/29/2017 1648   ACIDBASEDEF 4.0 (H) 08/28/2017 0732   O2SAT 96.0 08/28/2017 0732   CBG (last 3)  No results for input(s): GLUCAP in the last 72 hours.  Meds Scheduled Meds: . aspirin EC  81 mg Oral Daily  . atorvastatin  20 mg Oral q1800  . clopidogrel  75 mg Oral Daily  . enoxaparin (LOVENOX) injection  40 mg  Subcutaneous QHS  . lisinopril  20 mg Oral Daily  . mouth rinse  15 mL Mouth Rinse BID  . metoprolol succinate  75 mg Oral Daily  . moving right along book   Does not apply Once  . pantoprazole  40 mg Oral QAC breakfast  . sodium chloride flush  3 mL Intravenous Q12H   Continuous Infusions: . sodium chloride     PRN Meds:.sodium chloride, acetaminophen, ondansetron **OR** ondansetron (ZOFRAN) IV, oxyCODONE, sodium chloride flush, traMADol  Xrays No results found.  Assessment/Plan: S/P Procedure(s) (LRB): CORONARY ARTERY BYPASS GRAFTING (CABG) ON PUMP USING LEFT INTERNAL MAMMARY ARTERY AND LEFT GREATER SAPHENOUS VEIN VIA ENDOVEIN HARVEST (N/A)  1 conts to do well 2 remains hypertensive, will add norvasc , cont lisinopril 20 mg  3 sinus with some PVC's cont current betablocker dose 4 cont atorvastatin and ASA 5 routine pulm toilet/rehab 6 SNF short term, SW is assisting with bed search    LOS: 6 days    Cory Hess 09/02/2017   Chart reviewed, patient examined, agree with above. He is feeling better every day. Lopressor switched to Coreg for better BP control with reduced EF. Lisinopril switched to Losartan. SNF placement in progress.

## 2017-09-02 NOTE — NC FL2 (Signed)
[place] MEDICAID FL2 LEVEL OF CARE SCREENING TOOL     IDENTIFICATION  Patient Name: Cory Hess Birthdate: [birth year] Sex: male Admission Date (Current Location): 08/27/2017  [facility] and [place] Number:  Herbalist and Address:  [facility], [place] [address], [place], [place] [address]      Provider Number: [id number]  Attending Physician Name and Address:  Gaye Pollack, MD  Relative Name and Phone Number:       Current Level of Care: Hospital Recommended Level of Care: [place] Prior Approval Number:    Date Approved/Denied:   PASRR Number: [id number]  Discharge Plan: SNF    Current Diagnoses: Patient Active Problem List   Diagnosis Date Noted  . 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  . Coronary artery disease 08/28/2017  . 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 (HCC)     Orientation RESPIRATION BLADDER Height & Weight     Time, Situation, Place, Self  Normal Continent Weight: 236 lb 1.6 oz (107.1 kg) Height:  5\' 10"  (177.8 cm)  BEHAVIORAL SYMPTOMS/MOOD NEUROLOGICAL BOWEL NUTRITION STATUS      Continent Diet(Heart healthy, carb modified. thin liquids)  AMBULATORY STATUS COMMUNICATION OF NEEDS Skin   Limited Assist Verbally Surgical wounds(Closed incision, chest, Liquid skin adhesive. Closed incision, left leg, Liquid skin adhesive.)                       Personal Care Assistance Level of Assistance  Bathing, Feeding, Dressing Bathing Assistance: Limited assistance Feeding assistance: Independent Dressing Assistance: Limited assistance     Functional Limitations Info  Speech, Hearing, Sight Sight Info: Adequate Hearing Info: Adequate Speech Info: Adequate    SPECIAL CARE FACTORS FREQUENCY  OT (By licensed OT), PT (By licensed PT)     PT Frequency: 3x OT Frequency: 3x            Contractures Contractures Info: Not present    Additional Factors Info  Code Status,  Allergies Code Status Info: Full Code Allergies Info: NO known allergies           Current Medications (09/02/2017):  This is the current hospital active medication list Current Facility-Administered Medications  Medication Dose Route Frequency Provider Last Rate Last Dose  . 0.9 %  sodium chloride infusion  250 mL Intravenous PRN Gaye Pollack, MD      . acetaminophen (TYLENOL) tablet 650 mg  650 mg Oral Q6H PRN Gaye Pollack, MD      . amLODipine (NORVASC) tablet 5 mg  5 mg Oral Daily Gold, Wayne E, PA-C   5 mg at 09/02/17 0946  . aspirin EC tablet 81 mg  81 mg Oral Daily Lars Pinks M, PA-C   81 mg at 09/02/17 0946  . atorvastatin (LIPITOR) tablet 20 mg  20 mg Oral q1800 Gaye Pollack, MD   20 mg at 09/01/17 1735  . clopidogrel (PLAVIX) tablet 75 mg  75 mg Oral Daily Gaye Pollack, MD   75 mg at 09/02/17 0945  . enoxaparin (LOVENOX) injection 40 mg  40 mg Subcutaneous QHS Gaye Pollack, MD   40 mg at 09/01/17 2106  . lisinopril (PRINIVIL,ZESTRIL) tablet 20 mg  20 mg Oral Daily Gold, Wayne E, PA-C   20 mg at 09/02/17 0946  . MEDLINE mouth rinse  15 mL Mouth Rinse BID Gaye Pollack, MD   15 mL at 09/02/17 0946  . metoprolol succinate (TOPROL-XL) 24  hr tablet 75 mg  75 mg Oral Daily Skeet Latch, MD   75 mg at 09/02/17 0951  . moving right along book   Does not apply Once Gaye Pollack, MD      . ondansetron [facility]) tablet 4 mg  4 mg Oral Q6H PRN Gaye Pollack, MD   4 mg at 08/30/17 1149   Or  . ondansetron (ZOFRAN) injection 4 mg  4 mg Intravenous Q6H PRN Gaye Pollack, MD      . oxyCODONE (Oxy IR/ROXICODONE) immediate release tablet 5-10 mg  5-10 mg Oral Q3H PRN Gaye Pollack, MD   10 mg at 09/02/17 0945  . pantoprazole (PROTONIX) EC tablet 40 mg  40 mg Oral QAC breakfast Gaye Pollack, MD   40 mg at 09/02/17 0946  . sodium chloride flush (NS) 0.9 % injection 3 mL  3 mL Intravenous Q12H Gaye Pollack, MD   3 mL at 09/01/17 2107  . sodium chloride  flush (NS) 0.9 % injection 3 mL  3 mL Intravenous PRN Gaye Pollack, MD      . traMADol Veatrice Bourbon) tablet 50 mg  50 mg Oral Q4H PRN Gaye Pollack, MD   50 mg at 08/30/17 1516     Discharge Medications: Please see discharge summary for a list of discharge medications.  Relevant Imaging Results:  Relevant Lab Results:   Additional Information SSN: [id number]  Eileen Stanford, LCSW

## 2017-09-02 NOTE — Progress Notes (Signed)
CARDIAC REHAB PHASE I   PRE:  Rate/Rhythm: 102 ST  BP:  Sitting: 132/72      SaO2: 93 RA  MODE:  Ambulation: 470 ft 114 peak HR  POST:  Rate/Rhythm: 102 ST  BP:  Sitting: 129/81    SaO2: 98 RA   Pt ambulated 463ft in hallway. Pt did 181ft with front wheel rolling walker and c/o elbow pain. Pt walked remainder assist of one with gait belt. Reviewed d/c education with pt, has no further questions or concerns. Will continue to follow.   [id number] Rufina Falco, RN BSN 09/02/2017 9:31 AM

## 2017-09-02 NOTE — Clinical Social Work Note (Signed)
Clinical Social Worker facilitated patient discharge including contacting patient family and facility to confirm patient discharge plans.  Clinical information faxed to facility and family agreeable with plan.  CSW arranged ambulance transport via [facility] to [facility]--room 501.  RN to call [phone] for report prior to discharge.  Clinical Social Worker will sign off for now as social work intervention is no longer needed. Please consult [place] again if new need arises.  Mahamad Jabbi LCSWA [phone]

## 2017-09-02 NOTE — Progress Notes (Signed)
Report called to nurse Maudie Mercury at [employer].

## 2017-09-02 NOTE — Clinical Social Work Placement (Signed)
   CLINICAL SOCIAL WORK PLACEMENT  NOTE  Date:  09/02/2017  Patient Details  Name: Cory Hess MRN: [id number] Date of Birth: [birth year]  Clinical Social Work is seeking post-discharge placement for this patient at the [place] level of care (*CSW will initial, date and re-position this form in  chart as items are completed):      Patient/family provided with [facility] Work Department's list of facilities offering this level of care within the geographic area requested by the patient (or if unable, by the patient's family).  Yes   Patient/family informed of their freedom to choose among providers that offer the needed level of care, that participate in Medicare, Medicaid or managed care program needed by the patient, have an available bed and are willing to accept the patient.      Patient/family informed of [facility]'s ownership interest in [facility] and [facility], as well as of the fact that they are under no obligation to receive care at these facilities.  PASRR submitted to EDS on       PASRR number received on 09/02/17     Existing PASRR number confirmed on       FL2 transmitted to all facilities in geographic area requested by pt/family on 09/02/17     FL2 transmitted to all facilities within larger geographic area on       Patient informed that his/her managed care company has contracts with or will negotiate with certain facilities, including the following:        Yes   Patient/family informed of bed offers received.  Patient chooses bed at [facility]     Physician recommends and patient chooses bed at      Patient to be transferred to [facility] on 09/02/17.  Patient to be transferred to facility by [facility]     Patient family notified on 09/02/17 of transfer.  Name of family member notified:  Pt alert and oriented and responsible for self     PHYSICIAN Please prepare prescriptions      Additional Comment:    _______________________________________________ Eileen Stanford, LCSW 09/02/2017, 3:33 PM

## 2017-09-02 NOTE — Progress Notes (Signed)
Patient transported to [facility] by [facility]

## 2017-09-02 NOTE — Progress Notes (Signed)
Patient in a stable condition, discharge education reviewed with patient he verbalized understanding, patient belongings at bedside, patient awaiting PTAR to [facility]

## 2017-09-02 NOTE — Progress Notes (Signed)
Physical Therapy Treatment Patient Details Name: Cory Hess MRN: [id number] DOB: April 08, 1945 Today's Date: 09/02/2017    History of Present Illness Pt is a 72 y.o. male admitted with STEMI now s/p emergent CABG on 08/27/17. No pertinent PMH on file.     PT Comments    Patient progressing well with therapy, increasing ambulation distance and level of dependence with transfers and gait. VSS on RA, became mildly dyspneic with activity. Pt plans to d/c to SNF, agree with recommendations. Will cont to follow and progress as tolerated.     Follow Up Recommendations  SNF;Supervision for mobility/OOB     Equipment Recommendations  Rolling walker with 5" wheels    Recommendations for Other Services OT consult     Precautions / Restrictions Precautions Precautions: Fall;Sternal Precaution Comments: Reviewed sternal precautions with pt Restrictions Weight Bearing Restrictions: No    Mobility  Bed Mobility Overal bed mobility: Needs Assistance Bed Mobility: Sit to Supine       Sit to supine: Supervision      Transfers Overall transfer level: Needs assistance Equipment used: Rolling walker (2 wheeled) Transfers: Sit to/from Stand Sit to Stand: Supervision         General transfer comment: for safety, cues for hand placement  Ambulation/Gait Ambulation/Gait assistance: Min guard Gait Distance (Feet): 250 Feet Assistive device: None;Rolling walker (2 wheeled) Gait Pattern/deviations: Step-through pattern;Decreased stride length Gait velocity: decreased   General Gait Details: pt with improving mechanics and balance, RW for safety, min guard at times for balance. pt VSS, DOE 3/4 after activity.    Stairs             Wheelchair Mobility    Modified Rankin (Stroke Patients Only)       Balance Overall balance assessment: Needs assistance Sitting-balance support: No upper extremity supported;Feet supported Sitting balance-Leahy Scale: Fair     Standing  balance support: No upper extremity supported;During functional activity Standing balance-Leahy Scale: Fair                              Cognition Arousal/Alertness: Awake/alert Behavior During Therapy: WFL for tasks assessed/performed Overall Cognitive Status: Within Functional Limits for tasks assessed                                        Exercises      General Comments        Pertinent Vitals/Pain Faces Pain Scale: Hurts a little bit Pain Location: Sternal incision Pain Descriptors / Indicators: Pressure;Sore Pain Intervention(s): Limited activity within patient's tolerance;Monitored during session    Home Living                      Prior Function            PT Goals (current goals can now be found in the care plan section) Acute Rehab PT Goals Patient Stated Goal: Get stronger before going home PT Goal Formulation: With patient Time For Goal Achievement: 09/15/17 Potential to Achieve Goals: Good Progress towards PT goals: Progressing toward goals    Frequency    Min 3X/week      PT Plan Current plan remains appropriate    Co-evaluation              AM-PAC PT "6 Clicks" Daily Activity  Outcome Measure  Difficulty turning over in  bed (including adjusting bedclothes, sheets and blankets)?: A Little Difficulty moving from lying on back to sitting on the side of the bed? : A Little Difficulty sitting down on and standing up from a chair with arms (e.g., wheelchair, bedside commode, etc,.)?: A Little Help needed moving to and from a bed to chair (including a wheelchair)?: A Little Help needed walking in hospital room?: A Little Help needed climbing 3-5 steps with a railing? : A Lot 6 Click Score: 17    End of Session Equipment Utilized During Treatment: Gait belt Activity Tolerance: Patient tolerated treatment well   Nurse Communication: Mobility status PT Visit Diagnosis: Other abnormalities of gait and  mobility (R26.89)     Time: 1520-1540 PT Time Calculation (min) (ACUTE ONLY): 20 min  Charges:  $Gait Training: 8-22 mins                    G Codes:       Reinaldo Berber, PT, DPT Acute Rehab Services Pager: [phone]    Reinaldo Berber 09/02/2017, 3:41 PM

## 2017-09-02 NOTE — Progress Notes (Signed)
Progress Note  Patient Name: Cory Hess Date of Encounter: 09/02/2017  Primary Cardiologist: No primary care provider on file.   Subjective   Feeling well.  No chest pain or shortness of breath.    Inpatient Medications    Scheduled Meds: . amLODipine  5 mg Oral Daily  . aspirin EC  81 mg Oral Daily  . atorvastatin  20 mg Oral q1800  . clopidogrel  75 mg Oral Daily  . enoxaparin (LOVENOX) injection  40 mg Subcutaneous QHS  . lisinopril  20 mg Oral Daily  . mouth rinse  15 mL Mouth Rinse BID  . metoprolol succinate  75 mg Oral Daily  . moving right along book   Does not apply Once  . pantoprazole  40 mg Oral QAC breakfast  . sodium chloride flush  3 mL Intravenous Q12H   Continuous Infusions: . sodium chloride     PRN Meds: sodium chloride, acetaminophen, ondansetron **OR** ondansetron (ZOFRAN) IV, oxyCODONE, sodium chloride flush, traMADol   Vital Signs    Vitals:   09/01/17 2138 09/02/17 0219 09/02/17 0521 09/02/17 0710  BP: (!) 143/75  (!) 158/84 (!) 143/82  Pulse: 96  92 84  Resp: (!) 22  (!) 23 17  Temp: 98.5 F (36.9 C)  98.1 F (36.7 C) 98.2 F (36.8 C)  TempSrc: Oral  Oral Oral  SpO2: 90%  94% 93%  Weight:  236 lb 1.6 oz (107.1 kg)    Height:        Intake/Output Summary (Last 24 hours) at 09/02/2017 1333 Last data filed at 09/02/2017 0522 Gross per 24 hour  Intake 240 ml  Output 200 ml  Net 40 ml   Filed Weights   08/31/17 0409 09/01/17 0451 09/02/17 0219  Weight: 238 lb 12.8 oz (108.3 kg) 236 lb 12.4 oz (107.4 kg) 236 lb 1.6 oz (107.1 kg)    Telemetry    Sinus rhythm.  PVCs.  Rate related aberrancy. - Personally Reviewed  ECG    n/a - Personally Reviewed  Physical Exam   VS:  BP (!) 143/82 (BP Location: Right Arm)   Pulse 84   Temp 98.2 F (36.8 C) (Oral)   Resp 17   Ht 5\' 10"  (1.778 m)   Wt 236 lb 1.6 oz (107.1 kg)   SpO2 93%   BMI 33.88 kg/m  , BMI Body mass index is 33.88 kg/m. GENERAL:  Well appearing.  No acute  distress.  HEENT: Pupils equal round and reactive, fundi not visualized, oral mucosa unremarkable NECK:  No jugular venous distention, waveform within normal limits, carotid upstroke brisk and symmetric, no bruits CHEST: Midline incision C/D/I LUNGS:  Clear to auscultation bilaterally HEART:  RRR.  PMI not displaced or sustained,S1 and S2 within normal limits, no S3, no S4, no clicks, no rubs, no murmurs ABD:  Flat, positive bowel sounds normal in frequency in pitch, no bruits, no rebound, no guarding, no midline pulsatile mass, no hepatomegaly, no splenomegaly EXT:  2 plus pulses throughout, 1+ LE edema, no cyanosis no clubbing SKIN:  No rashes no nodules NEURO:  Cranial nerves II through XII grossly intact, motor grossly intact throughout PSYCH:  Cognitively intact, oriented to person place and time   Labs    Chemistry Recent Labs  Lab 08/27/17 1642  08/28/17 0227  08/28/17 1507  08/29/17 0600 08/29/17 1648 08/30/17 0343  NA 143   < > 142   < >  --    < > 140 138  138  K 3.9   < > 4.6   < >  --    < > 4.2 4.1 3.8  CL 111   < > 114*   < >  --    < > 108 102 106  CO2 24  --  24  --   --   --  27  --  21*  GLUCOSE 151*   < > 139*   < >  --    < > 122* 119* 111*  BUN 17   < > 14   < >  --    < > 24* 27* 25*  CREATININE 1.34*   < > 1.24   < > 1.20   < > 1.35* 1.10 1.04  CALCIUM 9.0  --  8.5*  --   --   --  8.5*  --  8.3*  PROT 7.3  --   --   --   --   --   --   --   --   ALBUMIN 4.2  --   --   --   --   --   --   --   --   AST 39  --   --   --   --   --   --   --   --   ALT 25  --   --   --   --   --   --   --   --   ALKPHOS 58  --   --   --   --   --   --   --   --   BILITOT 1.0  --   --   --   --   --   --   --   --   GFRNONAA 52*  --  57*   < > 60*  --  52*  --  >60  GFRAA >60  --  >60   < > >60  --  60*  --  >60  ANIONGAP 8  --  4*  --   --   --  5  --  11   < > = values in this interval not displayed.     Hematology Recent Labs  Lab 08/28/17 1507  08/29/17 0600  08/29/17 1648 08/30/17 0343  WBC 16.4*  --  16.0*  --  12.3*  RBC 4.15*  --  3.98*  --  3.55*  HGB 12.3*   < > 11.9* 11.2* 10.7*  HCT 38.0*   < > 37.2* 33.0* 33.2*  MCV 91.6  --  93.5  --  93.5  MCH 29.6  --  29.9  --  30.1  MCHC 32.4  --  32.0  --  32.2  RDW 13.2  --  13.6  --  13.1  PLT 121*  --  111*  --  99*   < > = values in this interval not displayed.    Cardiac Enzymes Recent Labs  Lab 08/27/17 1642  TROPONINI 0.60*   No results for input(s): TROPIPOC in the last 168 hours.   BNPNo results for input(s): BNP, PROBNP in the last 168 hours.   DDimer No results for input(s): DDIMER in the last 168 hours.   Radiology    No results found.  Cardiac Studies   LHC 08/27/17:   Ost LM to Dist LM lesion is 80% stenosed.  Prox LAD lesion is 100% stenosed.  Colon Flattery  Cx to Prox Cx lesion is 95% stenosed.  Prox RCA lesion is 60% stenosed.  Mid RCA lesion is 90% stenosed.  Ost RPDA to RPDA lesion is 80% stenosed.   1.  Suspected ST elevation myocardial infarction with presumed new left bundle branch block.  Severe left main stenosis which appears hazy suggestive of plaque rupture with chronic occlusion of mid LAD with collaterals from the right coronary artery, severe ostial left circumflex stenosis and severe RCA stenosis. 2.  Mildly elevated left ventricular end-diastolic pressure  at 17 mmHg.   3.  The LAD was probed with 2 wires and could not cross the occlusion which favors that this is a chronic occlusion. 4.  Successful intra-aortic balloon pump placement via the right common femoral artery.   Patient Profile     72 y.o. male with no known prior medical history (doesn't see MD regularly) who presented 08/27/17 with chest pain and BBB of unknown chronicity.  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  Underwent CABG 08/27/17.   Assessment & Plan    # CAD s/p CABG:  Cory Hess is doing well post op.  He had 3v CABG (LIMA-->LAD, SVG-->OM, SVG-->PDA).   Continue aspirin, clopidogrel and atorvastatin.  Switch metoprolol to carvedilol for improved BP control.    # Acute systolic and diastolic heart failure: # Hypertension: Intraop TEE reviewed.  LVEF 30-35% with septal, anterior and inferior hypokinesis.  Apical akiniesis.  Recommend complete echo in 3 months.  Will switch lisinopril to losartan with plans to switch to [facility].  Will switch metoprolol carvedilol for improved BP control.  Given reduced LVEF will stop amlodipine. Continue furosemide.    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  No evidence of VT.  Switch metoprolol to carvedilol as above.   For questions or updates, please contact [place] Please consult [email or web address] for contact info under Cardiology/STEMI.      Signed, Skeet Latch, MD  09/02/2017, 1:33 PM

## 2017-09-04 DIAGNOSIS — I2510 Atherosclerotic heart disease of native coronary artery without angina pectoris: Secondary | ICD-10-CM | POA: Diagnosis not present

## 2017-09-04 DIAGNOSIS — I25810 Atherosclerosis of coronary artery bypass graft(s) without angina pectoris: Secondary | ICD-10-CM | POA: Diagnosis not present

## 2017-09-04 DIAGNOSIS — I10 Essential (primary) hypertension: Secondary | ICD-10-CM | POA: Diagnosis not present

## 2017-09-04 DIAGNOSIS — E785 Hyperlipidemia, unspecified: Secondary | ICD-10-CM | POA: Diagnosis not present

## 2017-09-05 ENCOUNTER — Telehealth: Payer: Self-pay

## 2017-09-05 NOTE — H&P (Signed)
Pt transferred from [facility] - see H&P, cath notes by Dr Fletcher Anon. Arrived with [facility] for emergent cardiac surgery by Dr Cyndia Bent. Not seen by cardiology at [facility] on arrival as he went emergently for surgery.  Cory Hess 09/05/2017 8:21 AM

## 2017-09-05 NOTE — Telephone Encounter (Signed)
Cory Hess with [facility] given verbal order over the phone to have patient's sutures removed after CABG surgery 08/27/17 and discharge from the hospital 09/02/17.  She acknowledged receipt and stated that they would be removed the day he was to be transported from [place] to [place] for suture removal appointment on 09/07/17.

## 2017-09-12 ENCOUNTER — Ambulatory Visit (INDEPENDENT_AMBULATORY_CARE_PROVIDER_SITE_OTHER): Payer: Medicare Other | Admitting: Nurse Practitioner

## 2017-09-12 ENCOUNTER — Encounter: Payer: Self-pay | Admitting: Nurse Practitioner

## 2017-09-12 VITALS — BP 132/60 | HR 86 | Ht 72.0 in | Wt 236.0 lb

## 2017-09-12 DIAGNOSIS — I10 Essential (primary) hypertension: Secondary | ICD-10-CM | POA: Diagnosis not present

## 2017-09-12 DIAGNOSIS — I213 ST elevation (STEMI) myocardial infarction of unspecified site: Secondary | ICD-10-CM | POA: Diagnosis not present

## 2017-09-12 DIAGNOSIS — I2510 Atherosclerotic heart disease of native coronary artery without angina pectoris: Secondary | ICD-10-CM | POA: Diagnosis not present

## 2017-09-12 DIAGNOSIS — I5022 Chronic systolic (congestive) heart failure: Secondary | ICD-10-CM

## 2017-09-12 DIAGNOSIS — I255 Ischemic cardiomyopathy: Secondary | ICD-10-CM

## 2017-09-12 DIAGNOSIS — E785 Hyperlipidemia, unspecified: Secondary | ICD-10-CM | POA: Diagnosis not present

## 2017-09-12 MED ORDER — CLOPIDOGREL BISULFATE 75 MG PO TABS: 75.0000 mg | ORAL_TABLET | Freq: Every day | ORAL | 3 refills | Status: DC

## 2017-09-12 MED ORDER — ATORVASTATIN CALCIUM 80 MG PO TABS: 80.0000 mg | ORAL_TABLET | Freq: Every day | ORAL | 3 refills | Status: DC

## 2017-09-12 MED ORDER — CARVEDILOL 12.5 MG PO TABS: 12.5000 mg | ORAL_TABLET | Freq: Two times a day (BID) | ORAL | 3 refills | Status: DC

## 2017-09-12 MED ORDER — LOSARTAN POTASSIUM 50 MG PO TABS: 50.0000 mg | ORAL_TABLET | Freq: Every day | ORAL | 3 refills | Status: DC

## 2017-09-12 NOTE — Patient Instructions (Signed)
Medication Instructions: - Your physician has recommended you make the following change in your medication:   1) INCREASE lipitor (atorvastatin) to 80 mg -take 1 tablet by mouth once daily  Labwork: - none ordered  Procedures/Testing: - none ordered  Follow-Up: - Your physician recommends that you schedule a follow-up appointment in: 2 months with Dr. Fletcher Anon.   Any Additional Special Instructions Will Be Listed Below (If Applicable).  - A referral to Cardiac Rehab at [facility] has been placed for you. You should hear directly from Cardiac Rehab about scheduling an orientation and start date.    If you need a refill on your cardiac medications before your next appointment, please call your pharmacy.

## 2017-09-12 NOTE — Progress Notes (Signed)
Office Visit    Patient Name: Cory Hess Date of Encounter: 09/12/2017  Primary Care Provider:  Patient, No Pcp Per Primary Cardiologist:  Kathlyn Sacramento, MD  Chief Complaint    72 year old male status post recent CABG x3 with hypertension, hyperlipidemia, and ischemic cardiomyopathy/HFrEF, who presents for follow-up.  Past Medical History    Past Medical History:  Diagnosis Date  . CAD (coronary artery disease)    a. 08/2017 STEMI/Cath: LM 80ost/d, LAD 100p, LCX 95 ost/p, RCA 60p, 8m, RPDA 80ost; b. 08/2017 CABG x 3: LIMA->LAD, VG->OM, VG->PDA.  Marland Kitchen Essential hypertension   . HFrEF (heart failure with reduced ejection fraction) ([place])    a. 08/2017 TEE: EF 30-35%, sept/ant/inf HK, apical AK. 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  . Hyperlipidemia LDL goal <70   . Ischemic cardiomyopathy    a. 08/2017 TEE: EF 30-35%.   Past Surgical History:  Procedure Laterality Date  . CORONARY ARTERY BYPASS GRAFT N/A 08/27/2017   Procedure: CORONARY ARTERY BYPASS GRAFTING (CABG) ON PUMP USING LEFT INTERNAL MAMMARY ARTERY AND LEFT GREATER SAPHENOUS VEIN VIA ENDOVEIN HARVEST;  Surgeon: Gaye Pollack, MD;  Location: [place];  Service: Open Heart Surgery;  Laterality: N/A;  . CORONARY/GRAFT ACUTE MI REVASCULARIZATION N/A 08/27/2017   Procedure: Coronary/Graft Acute MI Revascularization;  Surgeon: Wellington Hampshire, MD;  Location: [place] CV LAB;  Service: Cardiovascular;  Laterality: N/A;  . LEFT HEART CATH AND CORONARY ANGIOGRAPHY N/A 08/27/2017   Procedure: LEFT HEART CATH AND CORONARY ANGIOGRAPHY;  Surgeon: Wellington Hampshire, MD;  Location: [place] CV LAB;  Service: Cardiovascular;  Laterality: N/A;    Allergies  No Known Allergies  History of Present Illness    72 year old male with the above complex past medical history.  He was recently admitted to [facility] in the setting of chest pain with presumed new left bundle branch block.  Cardiac catheterization showed severe multivessel  disease including an 80% hazy stenosis in the left main with an occluded mid LAD.  A balloon pump was placed and he was transferred to [facility] for emergent CT surgical evaluation and subsequently underwent CABG x3.  Intraoperative TEE showed an EF of 30 to 35%.  Postoperative course was relatively uncomplicated though he was deconditioned.  He was placed on medical therapy including aspirin, statin, beta-blocker, Plavix, and ARB and discharged to a skilled nursing facility for 1 week.  He was discharged from skilled nursing this past Friday.  Since hospital discharge, he reports having done well.  He has some chest wall pain though this is improving.  He has not had any angina.  He has walked some to his barn without any significant limitations.  He has noted mild left lower extremity swelling but otherwise has been stable.  His weight is unchanged from discharge.  He denies palpitations, PND, orthopnea, dizziness, syncope, or early satiety.  Home Medications    Prior to Admission medications   Medication Sig Start Date End Date Taking? Authorizing Provider  aspirin EC 81 MG EC tablet Take 1 tablet (81 mg total) by mouth daily. 09/03/17  Yes Gold, Wayne E, PA-C  carvedilol (COREG) 12.5 MG tablet Take 1 tablet (12.5 mg total) by mouth 2 (two) times daily with a meal. 09/12/17  Yes Theora Gianotti, NP  clopidogrel (PLAVIX) 75 MG tablet Take 1 tablet (75 mg total) by mouth daily. 09/12/17  Yes Theora Gianotti, NP  losartan (COZAAR) 50 MG tablet Take 1 tablet (50 mg total) by mouth  daily. 09/12/17  Yes Theora Gianotti, NP  Multiple Vitamin (MULTIVITAMIN WITH MINERALS) TABS tablet Take 1 tablet by mouth daily. One a Day over 50   Yes [provider]  oxyCODONE (OXY IR/ROXICODONE) 5 MG immediate release tablet Take 5 mg by mouth every 6 (six) hours as needed for severe pain.    Yes [provider]  atorvastatin (LIPITOR) 80 MG tablet Take 1 tablet (80 mg total) by  mouth daily. 09/12/17 12/11/17  Theora Gianotti, NP    Review of Systems    Continues to have some chest wall discomfort though this is improving.  He denies dyspnea, palpitations, PND, orthopnea, dizziness, syncope, or early satiety.  The left ankle, distal to venous harvest site, is mildly edematous.  All other systems reviewed and are otherwise negative except as noted above.  Physical Exam    VS:  BP 132/60 (BP Location: Left Arm, Patient Position: Sitting, Cuff Size: Normal)   Pulse 86   Ht 6' (1.829 m)   Wt 236 lb (107 kg)   BMI 32.01 kg/m  , BMI Body mass index is 32.01 kg/m. GEN: Well nourished, well developed, in no acute distress.  HEENT: normal.  Neck: Supple, no JVD, carotid bruits, or masses. Cardiac: RRR, no murmurs, rubs, or gallops. No clubbing, cyanosis, edema.  Radials/DP/PT 2+ and equal bilaterally.  Midline surgical incision is healing well as are abdominal incisions and left lower leg incisions.  No drainage or erythema. Respiratory:  Respirations regular and unlabored, clear to auscultation bilaterally. GI: Soft, nontender, nondistended, BS + x 4. MS: no deformity or atrophy. Skin: warm and dry, no rash. Neuro:  Strength and sensation are intact. Psych: Normal affect.  Accessory Clinical Findings    ECG -regular sinus rhythm, 86, left atrial enlargement, anteroseptal infarct, left bundle branch block with inferolateral ST and T changes.  Assessment & Plan    1.  ST segment elevation myocardial infarction, subsequent episode of care/CAD: Status post recent admission with catheterization revealing severe multivessel disease requiring CABG x3.  Relatively on complicated postoperative course.  Chest wall pain continues to improve and he is weaning himself down off of oxycodone.  He remains on aspirin, statin, beta-blocker, Plavix, and ARB therapy.  He has not been having any angina.  I am going to titrate his Lipitor to 80 mg daily.  2.  Ischemic  cardiomyopathy/HFrEF: Euvolemic on exam.  He remains on beta-blocker and ARB therapy.  We discussed possibly switching him from losartan to [facility].  Prefers to stay on current medicine at this time as it was just filled.  Can reconsider in the future along with initiation of Spironolactone if tolerated.  Plan to follow-up echo in approximately 3 months.  3.  Essential hypertension: Stable.  Next  4.  Hyperlipidemia: LDL was 138 on July 6.  I am titrating Lipitor to 80 mg.  Follow-up lipids and LFTs in approximate 6 to 8 weeks.  5.  Disposition: Follow-up in clinic in 1 month.  We will send referral for cardiac rehab.  We did have a discussion about this and he is not sure that he wants to participate.  I strongly encouraged him to participate in cardiac rehab.   Murray Hodgkins, NP 09/12/2017, 5:41 PM

## 2017-09-19 ENCOUNTER — Telehealth: Payer: Self-pay | Admitting: Cardiovascular Disease

## 2017-09-19 ENCOUNTER — Telehealth: Payer: Self-pay

## 2017-09-19 MED ORDER — ATORVASTATIN CALCIUM 80 MG PO TABS: 80.0000 mg | ORAL_TABLET | Freq: Every day | ORAL | 3 refills | Status: DC

## 2017-09-19 MED ORDER — CLOPIDOGREL BISULFATE 75 MG PO TABS: 75.0000 mg | ORAL_TABLET | Freq: Every day | ORAL | 3 refills | Status: DC

## 2017-09-19 MED ORDER — LOSARTAN POTASSIUM 50 MG PO TABS: 50.0000 mg | ORAL_TABLET | Freq: Every day | ORAL | 3 refills | Status: DC

## 2017-09-19 MED ORDER — CARVEDILOL 12.5 MG PO TABS: 12.5000 mg | ORAL_TABLET | Freq: Two times a day (BID) | ORAL | 3 refills | Status: DC

## 2017-09-19 NOTE — Telephone Encounter (Signed)
Patient recently seen in ov by Sharolyn Douglas and wants to know why there was such a big increase of Lipitor to 80 mg po q day.  Patient concerned about side effects.  Please call to discuss.

## 2017-09-19 NOTE — Telephone Encounter (Signed)
I called and spoke with the patient regarding the increase in his lipitor dose from 20 mg once daily to 80 mg once daily when seeing Ignacia Bayley, NP on 09/12/17.  I advised him that this is guideline directed therapy due to his recent CABG and elevated LDL. He was concerned due to the potential side effects from the higher dose statin. I reviewed with the patient that each person responds differently to statin therapy and that some people can tolerate high doses and other cannot tolerate low doses of statins. It is really dependent on the person.  He questioned if we had discussed this with Dr. Cyndia Bent.  I advised the patient that Gerald Stabs, NP could not find anything in the patient's chart as to why the higher dose was not originally prescribed.  I advised him that he may call Dr. Vivi Martens office and get his opinion on this if he wished, but that this was the cardiology recommendation as we will be following him long term post CABG. He voices understanding.  He is also requesting that his RX's be sent to [facility] on [address] [facility]. He has not picked [facility] RX[facility] from [facility] yet.   [facility] [place] to cancel the RX request that were sent in. I have sent these to [facility] on [address].

## 2017-09-19 NOTE — Telephone Encounter (Signed)
Patient called concerned about the increase of his Lipitor by the NP at his Cardiologist office.  He stated that he was taking 20 mg and is now taking 80 mg.  He wanted to know if Dr. Cyndia Bent approved of this.  I advised him that Dr. Cyndia Bent would go by what his Cardiologist said.  He then stated that he was concerned because he had a friend take the medication and it caused lower leg paralysis and he did not want that of himself.  I advised him to contact the Cardiology office and talk with them about medication changes if he felt he did not want to be on them.  He stated that he did not have any side effects on 20 mg, so he was going take the 80 mg.  All questions were answered.  Patient acknowledged receipt.

## 2017-09-23 ENCOUNTER — Telehealth: Payer: Self-pay | Admitting: Cardiovascular Disease

## 2017-09-23 NOTE — Telephone Encounter (Signed)
Spoke with patient and advised that he should double check with his pharmacist and if no interactions to use sparingly. He verbalized understanding with no further questions.

## 2017-09-23 NOTE — Telephone Encounter (Signed)
Patient called asking if it was okay for him to take over the counter Aleve or Advil   Based on the four medications he's taking for his heart he would like to just make sure  Please advise

## 2017-09-26 ENCOUNTER — Telehealth: Payer: Self-pay

## 2017-09-26 ENCOUNTER — Other Ambulatory Visit: Payer: Self-pay | Admitting: *Deleted

## 2017-09-26 NOTE — Telephone Encounter (Signed)
Patient called and stated that he was told he could not shower.  He had CABG with Dr. Cyndia Bent on 08/27/2017.  I advised patient that he could shower.  Just let soap and water run down the incision site, and pat dry.  He acknowledged receipt.  He stated that his incisions looked great.  There were no signs/ symptoms of infection.

## 2017-09-28 ENCOUNTER — Other Ambulatory Visit: Payer: Self-pay | Admitting: Surgery

## 2017-09-28 DIAGNOSIS — Z951 Presence of aortocoronary bypass graft: Secondary | ICD-10-CM

## 2017-10-03 ENCOUNTER — Other Ambulatory Visit: Payer: Self-pay

## 2017-10-03 ENCOUNTER — Ambulatory Visit (INDEPENDENT_AMBULATORY_CARE_PROVIDER_SITE_OTHER): Payer: Self-pay | Admitting: Surgical

## 2017-10-03 ENCOUNTER — Encounter: Payer: Self-pay | Admitting: Surgical

## 2017-10-03 ENCOUNTER — Ambulatory Visit
Admission: RE | Admit: 2017-10-03 | Discharge: 2017-10-03 | Disposition: A | Discharge status: Home / Self Care | Payer: Self-pay | Source: Ambulatory Visit | Attending: Surgery | Admitting: Surgery

## 2017-10-03 VITALS — BP 122/73 | HR 88 | Resp 16 | Ht 72.0 in | Wt 227.8 lb

## 2017-10-03 DIAGNOSIS — I2101 ST elevation (STEMI) myocardial infarction involving left main coronary artery: Secondary | ICD-10-CM

## 2017-10-03 DIAGNOSIS — Z951 Presence of aortocoronary bypass graft: Secondary | ICD-10-CM

## 2017-10-03 DIAGNOSIS — I2510 Atherosclerotic heart disease of native coronary artery without angina pectoris: Secondary | ICD-10-CM

## 2017-10-03 MED ORDER — OXYCODONE HCL 5 MG PO TABS: 5.0000 mg | ORAL_TABLET | Freq: Every evening | ORAL | 0 refills | Status: DC | PRN

## 2017-10-03 NOTE — Progress Notes (Signed)
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 Median Sternotomy 2. Extracorporeal circulation 3.   Coronary artery bypass grafting x 3    Left internal mammary graft to the LAD  SVG to OM  SVG to PDA   4.   Endoscopic vein harvest from the left leg   Anesthesia:  General Endotracheal History of Present Illness:    The patient is a 72 year old male status post the above described procedure seen in the office on today's date and routine postsurgical follow-up.  The patient describes a routine recovery.  He is feeling well overall but does describe some difficulty with sleeping and has been taking 1 pain pill at night.  He is asking for a short term refill for this as well.  He denies fevers, chills or other constitutional symptoms.  He is not having anginal equivalents and is not having any difficulty with shortness of breath.  He does not have any lower extremity edema.  He denies palpitations.  He had no significant difficulties with his incisions.  He is ambulating well and regaining his energy slowly over time.  Overall he is pleased with his general progress.      Past Medical History:  Diagnosis Date  . CAD (coronary artery disease)    a. 08/2017 STEMI/Cath: LM 80ost/d, LAD 100p, LCX 95 ost/p, RCA 60p, 96m, RPDA 80ost; b. 08/2017  CABG x 3: LIMA->LAD, VG->OM, VG->PDA.  Marland Kitchen Essential hypertension   . HFrEF (heart failure with reduced ejection fraction) ([place])    a. 08/2017 TEE: EF 30-35%, sept/ant/inf HK, apical AK. 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  . Hyperlipidemia LDL goal <70   . Ischemic cardiomyopathy    a. 08/2017 TEE: EF 30-35%.     Social History   Tobacco Use  Smoking Status Never Smoker  Smokeless Tobacco Never Used    Social History   Substance and Sexual Activity  Alcohol Use Not Currently     No Known Allergies  Current Outpatient Medications  Medication Sig Dispense Refill  . aspirin EC 81 MG EC tablet Take 1 tablet (81 mg total) by mouth daily.    Marland Kitchen atorvastatin (LIPITOR) 80 MG tablet Take 1 tablet (80 mg total) by mouth daily. 90 tablet 3  . carvedilol (COREG) 12.5 MG tablet Take 1 tablet (12.5 mg total) by mouth 2 (two) times daily with a meal. 180 tablet 3  . clopidogrel (PLAVIX)  75 MG tablet Take 1 tablet (75 mg total) by mouth daily. 90 tablet 3  . losartan (COZAAR) 50 MG tablet Take 1 tablet (50 mg total) by mouth daily. 90 tablet 3  . Multiple Vitamin (MULTIVITAMIN WITH MINERALS) TABS tablet Take 1 tablet by mouth daily. One a Day over 17    . oxyCODONE (OXY IR/ROXICODONE) 5 MG immediate release tablet Take 5 mg by mouth every 6 (six) hours as needed for severe pain.      No current facility-administered medications for this visit.        Physical Exam: BP 122/73 (BP Location: Left Arm, Patient Position: Sitting, Cuff Size: Large)   Pulse 88   Resp 16   Ht 6' (1.829 m)   Wt 103.3 kg   SpO2 95% Comment: ON RA  BMI 30.90 kg/m   General appearance: alert, cooperative and no distress Heart: regular rate and rhythm Lungs: clear to auscultation bilaterally Abdomen: benign exam Extremities: no edema Wound: Incisions healing well without evidence of infection   Diagnostic Studies & Laboratory data:     Recent Radiology Findings:   Dg Chest 2 View  Result Date: 10/03/2017 CLINICAL  DATA:  Status post coronary bypass graft. EXAM: CHEST - 2 VIEW COMPARISON:  Radiograph August 30, 2017. FINDINGS: The heart size and mediastinal contours are within normal limits. Both lungs are clear. No pneumothorax or pleural effusion is noted. Status post coronary bypass graft. The visualized skeletal structures are unremarkable. IMPRESSION: No active cardiopulmonary disease. Electronically Signed   By: Cory Hess, M.D.   On: 10/03/2017 12:56      Recent Lab Findings: Lab Results  Component Value Date   WBC 12.3 (H) 08/30/2017   HGB 10.7 (L) 08/30/2017   HCT 33.2 (L) 08/30/2017   PLT 99 (L) 08/30/2017   GLUCOSE 111 (H) 08/30/2017   CHOL 197 08/27/2017   TRIG 128 08/27/2017   HDL 33 (L) 08/27/2017   LDLCALC 138 (H) 08/27/2017   ALT 25 08/27/2017   AST 39 08/27/2017   NA 138 08/30/2017   K 3.8 08/30/2017   CL 106 08/30/2017   CREATININE 1.04 08/30/2017   BUN 25 (H) 08/30/2017   CO2 21 (L) 08/30/2017   INR 1.34 08/28/2017   HGBA1C 5.6 08/28/2017      Assessment / Plan: The patient is doing well from a surgical perspective.  No current surgically related issues.  Did renew his oxycodone 5 mg 1 tablet nightly as needed for 14 tablets.  We will continue long-term cardiology management with his cardiologist and we will see again on a as needed basis for any surgically related issues or at request.          Cory Giovanni, PA-C 10/03/2017 1:31 PM Pager [phone]

## 2017-10-03 NOTE — Patient Instructions (Signed)
Discussed postop advancement activities including driving

## 2017-11-18 ENCOUNTER — Ambulatory Visit: Payer: Self-pay | Admitting: Cardiovascular Disease

## 2017-11-18 NOTE — Progress Notes (Deleted)
Cardiology Office Note   Date:  11/18/2017   ID:  Cory Hess, DOB [birth year], MRN [id number]  PCP:  Patient, No Pcp Per  Cardiologist:  Kathlyn Sacramento, MD   No chief complaint on file.     History of Present Illness: Cory Hess is a 72 y.o. male who presents for ***    Past Medical History:  Diagnosis Date  . CAD (coronary artery disease)    a. 08/2017 STEMI/Cath: LM 80ost/d, LAD 100p, LCX 95 ost/p, RCA 60p, 65m, RPDA 80ost; b. 08/2017 CABG x 3: LIMA->LAD, VG->OM, VG->PDA.  Marland Kitchen Essential hypertension   . HFrEF (heart failure with reduced ejection fraction) ([place])    a. 08/2017 TEE: EF 30-35%, sept/ant/inf HK, apical AK. 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  . Hyperlipidemia LDL goal <70   . Ischemic cardiomyopathy    a. 08/2017 TEE: EF 30-35%.    Past Surgical History:  Procedure Laterality Date  . CORONARY ARTERY BYPASS GRAFT N/A 08/27/2017   Procedure: CORONARY ARTERY BYPASS GRAFTING (CABG) ON PUMP USING LEFT INTERNAL MAMMARY ARTERY AND LEFT GREATER SAPHENOUS VEIN VIA ENDOVEIN HARVEST;  Surgeon: Gaye Pollack, MD;  Location: [facility];  Service: Open Heart Surgery;  Laterality: N/A;  . CORONARY/GRAFT ACUTE MI REVASCULARIZATION N/A 08/27/2017   Procedure: Coronary/Graft Acute MI Revascularization;  Surgeon: Wellington Hampshire, MD;  Location: [facility];  Service: Cardiovascular;  Laterality: N/A;  . LEFT HEART CATH AND CORONARY ANGIOGRAPHY N/A 08/27/2017   Procedure: LEFT HEART CATH AND CORONARY ANGIOGRAPHY;  Surgeon: Wellington Hampshire, MD;  Location: [place] [facility];  Service: Cardiovascular;  Laterality: N/A;     Current Outpatient Medications  Medication Sig Dispense Refill  . aspirin EC 81 MG EC tablet Take 1 tablet (81 mg total) by mouth daily.    Marland Kitchen atorvastatin (LIPITOR) 80 MG tablet Take 1 tablet (80 mg total) by mouth daily. 90 tablet 3  . carvedilol (COREG) 12.5 MG tablet Take 1 tablet (12.5 mg total) by mouth 2 (two) times daily with a meal. 180 tablet 3  .  clopidogrel (PLAVIX) 75 MG tablet Take 1 tablet (75 mg total) by mouth daily. 90 tablet 3  . losartan (COZAAR) 50 MG tablet Take 1 tablet (50 mg total) by mouth daily. 90 tablet 3  . Multiple Vitamin (MULTIVITAMIN WITH MINERALS) TABS tablet Take 1 tablet by mouth daily. One a Day over 41    . oxyCODONE (OXY IR/ROXICODONE) 5 MG immediate release tablet Take 1 tablet (5 mg total) by mouth at bedtime as needed for severe pain. 14 tablet 0   No current facility-administered medications for this visit.     Allergies:   Patient has no known allergies.    Social History:  The patient  reports that he has never smoked. He has never used smokeless tobacco. He reports that he drank alcohol. He reports that he has current or past drug history.   Family History:  The patient's ***family history is not on file.    ROS:  Please see the history of present illness.   Otherwise, review of systems are positive for {NONE DEFAULTED:18576::"none"}.   All other systems are reviewed and negative.    PHYSICAL EXAM: VS:  There were no vitals taken for this visit. , BMI There is no height or weight on file to calculate BMI. GEN: Well nourished, well developed, in no acute distress  HEENT: normal  Neck: no JVD, carotid bruits, or masses Cardiac: ***RRR; no murmurs,  rubs, or gallops,no edema  Respiratory:  clear to auscultation bilaterally, normal work of breathing GI: soft, nontender, nondistended, + BS MS: no deformity or atrophy  Skin: warm and dry, no rash Neuro:  Strength and sensation are intact Psych: euthymic mood, full affect   EKG:  EKG {ACTION; IS/IS VFM:[id number]} ordered today. The ekg ordered today demonstrates ***   Recent Labs: 08/27/2017: ALT 25 08/28/2017: Magnesium 2.3 08/30/2017: BUN 25; Creatinine, Ser 1.04; Hemoglobin 10.7; Platelets 99; Potassium 3.8; Sodium 138    Lipid Panel    Component Value Date/Time   CHOL 197 08/27/2017 1642   TRIG 128 08/27/2017 1642   HDL 33 (L)  08/27/2017 1642   CHOLHDL 6.0 08/27/2017 1642   VLDL 26 08/27/2017 1642   LDLCALC 138 (H) 08/27/2017 1642      Wt Readings from Last 3 Encounters:  10/03/17 227 lb 12.8 oz (103.3 kg)  09/12/17 236 lb (107 kg)  09/02/17 236 lb 1.6 oz (107.1 kg)      Other studies Reviewed: Additional studies/ records that were reviewed today include: ***. Review of the above records demonstrates: ***  No flowsheet data found.    ASSESSMENT AND PLAN:  1.  ***    Disposition:   FU with *** in {gen number 6-43:838184} {Days to years:10300}  Signed,  Kathlyn Sacramento, MD  11/18/2017 1:50 PM    [facility]

## 2017-12-26 ENCOUNTER — Ambulatory Visit: Payer: Self-pay | Admitting: Physician Assistant

## 2017-12-28 ENCOUNTER — Encounter: Payer: Self-pay | Admitting: Nurse Practitioner

## 2017-12-28 ENCOUNTER — Ambulatory Visit (INDEPENDENT_AMBULATORY_CARE_PROVIDER_SITE_OTHER): Payer: Medicare Other | Admitting: Nurse Practitioner

## 2017-12-28 VITALS — BP 120/70 | HR 81 | Ht 72.0 in | Wt 236.2 lb

## 2017-12-28 DIAGNOSIS — I255 Ischemic cardiomyopathy: Secondary | ICD-10-CM

## 2017-12-28 DIAGNOSIS — E785 Hyperlipidemia, unspecified: Secondary | ICD-10-CM | POA: Diagnosis not present

## 2017-12-28 DIAGNOSIS — I2510 Atherosclerotic heart disease of native coronary artery without angina pectoris: Secondary | ICD-10-CM | POA: Diagnosis not present

## 2017-12-28 DIAGNOSIS — I5022 Chronic systolic (congestive) heart failure: Secondary | ICD-10-CM

## 2017-12-28 NOTE — Patient Instructions (Signed)
Medication Instructions:  - Your physician recommends that you continue on your current medications as directed. Please refer to the Current Medication list given to you today.  If you need a refill on your cardiac medications before your next appointment, please call your pharmacy.   Lab work: - Your physician recommends that you have lab work today: lipid/ liver/ direct LDL  If you have labs (blood work) drawn today and your tests are completely normal, you will receive your results only by: Marland Kitchen MyChart Message (if you have MyChart) OR . A paper copy in the mail If you have any lab test that is abnormal or we need to change your treatment, we will call you to review the results.  Testing/Procedures: - Your physician has requested that you have an echocardiogram. Echocardiography is a painless test that uses sound waves to create images of your heart. It provides your doctor with information about the size and shape of your heart and how well your heart's chambers and valves are working. This procedure takes approximately one hour. There are no restrictions for this procedure. Please come to the test well hydrated as we occasionally have to start an IV on some patients. This is done to inject an image enhancing agent if needed.  Follow-Up: At [facility], you and your health needs are our priority.  As part of our continuing mission to provide you with exceptional heart care, we have created designated Provider Care Teams.  These Care Teams include your primary Cardiologist (physician) and Advanced Practice Providers (APPs -  Physician Assistants and Nurse Practitioners) who all work together to provide you with the care you need, when you need it. You will need a follow up appointment in 4 months.  Please call our office 2 months in advance to schedule this appointment.  You may see Dr. Harrell Gave End or one of the following Advanced Practice Providers on your designated Care Team:    Murray Hodgkins, NP Christell Faith, PA-C . Marrianne Mood, PA-C  Any Other Special Instructions Will Be Listed Below (If Applicable). - N/A

## 2017-12-28 NOTE — Progress Notes (Signed)
Office Visit    Patient Name: Cory Hess Date of Encounter: 12/28/2017  Primary Care Provider:  Patient, No Pcp Per Primary Cardiologist:  Nelva Bush, MD  Chief Complaint    72 year old male with a history of CAD status post CABG x3 in July 2019, hypertension, hyperlipidemia, and ischemic cardiomyopathy/HFrEF with an EF of 30 to 35%, who presents for follow up of CAD s/p CABG x 3 and ischemic cardiomyopathy.   Past Medical History    Past Medical History:  Diagnosis Date  . CAD (coronary artery disease)    a. 08/2017 STEMI/Cath: LM 80ost/d, LAD 100p, LCX 95 ost/p, RCA 60p, 23m, RPDA 80ost; b. 08/2017 CABG x 3: LIMA->LAD, VG->OM, VG->PDA.  Marland Kitchen Essential hypertension   . HFrEF (heart failure with reduced ejection fraction) ([place])    a. 08/2017 TEE: EF 30-35%, sept/ant/inf HK, apical AK. 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  . Hyperlipidemia LDL goal <70   . Ischemic cardiomyopathy    a. 08/2017 TEE: EF 30-35%.   Past Surgical History:  Procedure Laterality Date  . CORONARY ARTERY BYPASS GRAFT N/A 08/27/2017   Procedure: CORONARY ARTERY BYPASS GRAFTING (CABG) ON PUMP USING LEFT INTERNAL MAMMARY ARTERY AND LEFT GREATER SAPHENOUS VEIN VIA ENDOVEIN HARVEST;  Surgeon: Gaye Pollack, MD;  Location: [place];  Service: Open Heart Surgery;  Laterality: N/A;  . CORONARY/GRAFT ACUTE MI REVASCULARIZATION N/A 08/27/2017   Procedure: Coronary/Graft Acute MI Revascularization;  Surgeon: Wellington Hampshire, MD;  Location: [place];  Service: Cardiovascular;  Laterality: N/A;  . LEFT HEART CATH AND CORONARY ANGIOGRAPHY N/A 08/27/2017   Procedure: LEFT HEART CATH AND CORONARY ANGIOGRAPHY;  Surgeon: Wellington Hampshire, MD;  Location: [facility];  Service: Cardiovascular;  Laterality: N/A;    Allergies  No Known Allergies  History of Present Illness    72 year old male with a history of hypertension, hyperlipidemia, and ischemic cardiomyopathy/HFrEF with an EF of 30 to 35%. Was admitted in  July with chest pain and new LBBB, he underwent LHC that showed multivessel disease, including 80% LM, LAD 100% p, LCX 95% ost/p, RCA 60% p, 90% m, RPDA 80% ost. An Intra-Aortic Balloon Pump was placed and he was transferred to [facility] for emergent CABG x 3. Intraoperative TEE showed an EF of 30 to 35%.  Postoperative course was relatively uncomplicated.  He was placed on medical therapy including aspirin, statin, beta-blocker, Plavix, and ARB and discharged to a skilled nursing facility for 1 week.  Since his last clinic visit in July, he has done well. He denies chest pain, SOB, DOE, PND, dizziness, syncope, or edema. His only complaint is some sensitivity at his incision site, for which he reports massage and cleansing with rubbing alcohol alleviates. He has not had any skin breakdown, swelling, or drainage.  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  He states he does not follow any type of strict diet but does try to be mindful of his salt intake. He has been adherent with his medication, including his ASA, Plavix, beta blocker, ARB, and statin. He does report rare muscle cramping in bilateral upper extremities but that it is not debilitating.     Home Medications    Prior to Admission medications   Medication Sig Start Date End Date Taking? Authorizing Provider  aspirin EC 81 MG EC tablet Take 1 tablet (81 mg total) by mouth daily. 09/03/17  Gold, Wayne E, PA-C  atorvastatin (LIPITOR) 80 MG tablet Take 1 tablet (80 mg total) by mouth daily. 09/19/17 12/18/17  Theora Gianotti, NP  carvedilol (COREG) 12.5 MG tablet Take 1 tablet (12.5 mg total) by mouth 2 (two) times daily with a meal. 09/19/17   Theora Gianotti, NP  clopidogrel (PLAVIX) 75 MG tablet Take 1 tablet (75 mg total) by mouth daily. 09/19/17   Theora Gianotti, NP  losartan (COZAAR) 50 MG tablet Take 1 tablet (50 mg  total) by mouth daily. 09/19/17   Theora Gianotti, NP  Multiple Vitamin (MULTIVITAMIN WITH MINERALS) TABS tablet Take 1 tablet by mouth daily. One a Day over 54    [provider]  oxyCODONE (OXY IR/ROXICODONE) 5 MG immediate release tablet Take 1 tablet (5 mg total) by mouth at bedtime as needed for severe pain. 10/03/17   [name], PA-C    Review of Systems    He denies chest pain, SOB, DOE, PND, dizziness, syncope or edema.  All other systems reviewed and are otherwise negative except as noted above.  Physical Exam    VS:  BP 120/70 (BP Location: Left Arm, Patient Position: Sitting, Cuff Size: Normal)   Pulse 81   Ht 6' (1.829 m)   Wt 236 lb 4 oz (107.2 kg)   BMI 32.04 kg/m  , BMI Body mass index is 32.04 kg/m. GEN: Well nourished, well developed, in no acute distress. HEENT: normal. Neck: Supple, no JVD, carotid bruits, or masses. Cardiac: RRR, no murmurs, rubs, or gallops. No clubbing, cyanosis, edema.  Radials/DP/PT 2+ and equal bilaterally. Well healed midline sternotomy incision. Respiratory:  Respirations regular and unlabored, clear to auscultation bilaterally. GI: Soft, nontender, nondistended, BS + x 4. MS: no deformity or atrophy. Skin: warm and dry, no rash. Neuro:  Strength and sensation are intact. Psych: Normal affect.  Accessory Clinical Findings    ECG personally reviewed by me today - NSR, 81, left bundle branch block- no acute changes.  Assessment & Plan    1.  CAD s/p CABGx3: s/p STEMI in July, underwent LHC that showed multivessel disease that required 3 vessel CABG. He is recovering well with only mild incisional sensitivity to touch remaining. He remains active w/o limitations and on DAPT, beta blocker, ARB, and Statin. Will continue current regimen.   2. Ischemic Cardiomyopathy/HFrEF: appears euvolemic on exam, weight stable, no edema, SOB, or DOE. Will continue on beta-blocker and ARB. Due for follow up echo, will schedule this  visit. F/u bmet today.  If EF remains <40%, would look to transition to entresto and likely add spiro @ some point.  May also need EP referral if EF <35%.  3. Essential Hypertension: BP well controlled on current beta-blocker and ARB, will continue current therapy.   4. Hyperlipidemia: Last LDL in July was 138, was started on high dose statin at last office visit. He rescheduled his last appointment so follow up lab work has not been checked yet. Will get CMP and Lipid panel today.   5. Disposition: Will follow up in 4 months or sooner if needed. Will draw labs and schedule follow up echo. Pt has asked to f/u with Dr. Saunders Revel.  Murray Hodgkins, NP 12/28/2017, 3:26 PM

## 2017-12-29 LAB — LIPID PANEL
CHOLESTEROL TOTAL: 104 mg/dL (ref 100–199)
Chol/HDL Ratio: 3.5 ratio (ref 0.0–5.0)
HDL: 30 mg/dL — ABNORMAL LOW (ref >39)
LDL CALC: 58 mg/dL (ref 0–99)
TRIGLYCERIDES: 80 mg/dL (ref 0–149)
VLDL CHOLESTEROL CAL: 16 mg/dL (ref 5–40)

## 2017-12-29 LAB — HEPATIC FUNCTION PANEL
ALK PHOS: 60 IU/L (ref 39–117)
ALT: 14 IU/L (ref 0–44)
AST: 18 IU/L (ref 0–40)
Albumin: 4.3 g/dL (ref 3.5–4.8)
BILIRUBIN, DIRECT: 0.13 mg/dL (ref 0.00–0.40)
Bilirubin Total: 0.4 mg/dL (ref 0.0–1.2)
TOTAL PROTEIN: 6.6 g/dL (ref 6.0–8.5)

## 2017-12-29 LAB — LDL CHOLESTEROL, DIRECT: LDL Direct: 58 mg/dL (ref 0–99)

## 2017-12-30 ENCOUNTER — Telehealth: Payer: Self-pay | Admitting: *Deleted

## 2017-12-30 NOTE — Telephone Encounter (Signed)
Patient made aware of results and verbalized understanding.   Notes recorded by Theora Gianotti, NP on 12/29/2017 at 10:02 AM EST LDL @ goal (58). LFT's ok.

## 2018-01-03 ENCOUNTER — Other Ambulatory Visit: Payer: Self-pay

## 2018-01-03 ENCOUNTER — Ambulatory Visit (INDEPENDENT_AMBULATORY_CARE_PROVIDER_SITE_OTHER): Payer: Medicare Other

## 2018-01-03 ENCOUNTER — Telehealth: Payer: Self-pay | Admitting: Nurse Practitioner

## 2018-01-03 DIAGNOSIS — I255 Ischemic cardiomyopathy: Secondary | ICD-10-CM

## 2018-01-03 MED ORDER — PERFLUTREN LIPID MICROSPHERE
1.0000 mL | INTRAVENOUS | Status: AC | PRN
  Administered 2018-01-03: 2 mL via INTRAVENOUS

## 2018-01-03 NOTE — Telephone Encounter (Signed)
No answer. Left message to call back.   

## 2018-01-03 NOTE — Telephone Encounter (Signed)
Patient calling to discuss recent Lab testing results  ° °Please call  ° °

## 2018-01-05 NOTE — Telephone Encounter (Signed)
See results. Echocardiogram results and recommendations reviewed with patient and he verbalized understanding with no further questions at this time.

## 2018-01-12 DIAGNOSIS — H2513 Age-related nuclear cataract, bilateral: Secondary | ICD-10-CM | POA: Diagnosis not present

## 2018-02-17 DIAGNOSIS — H2513 Age-related nuclear cataract, bilateral: Secondary | ICD-10-CM | POA: Diagnosis not present

## 2018-02-27 ENCOUNTER — Other Ambulatory Visit: Payer: Self-pay

## 2018-02-27 ENCOUNTER — Encounter: Payer: Self-pay | Admitting: Emergency Medicine

## 2018-02-27 ENCOUNTER — Ambulatory Visit
Admission: EM | Admit: 2018-02-27 | Discharge: 2018-02-27 | Disposition: A | Discharge status: Home / Self Care | Payer: Medicare Other | Attending: Family Medicine | Admitting: Family Medicine

## 2018-02-27 DIAGNOSIS — I10 Essential (primary) hypertension: Secondary | ICD-10-CM

## 2018-02-27 DIAGNOSIS — R42 Dizziness and giddiness: Secondary | ICD-10-CM | POA: Insufficient documentation

## 2018-02-27 MED ORDER — MECLIZINE HCL 25 MG PO TABS: 25.0000 mg | ORAL_TABLET | Freq: Three times a day (TID) | ORAL | 0 refills | Status: DC | PRN

## 2018-02-27 NOTE — ED Provider Notes (Signed)
[facility]    CSN: [id number] Arrival date & time: 02/27/18  1240  History   Chief Complaint Chief Complaint  Patient presents with  . Dizziness   HPI   73 year old male presents with dizziness.  Reports a 5 to 6-day history of dizziness.  Seems to have been improving but then worsened again yesterday.  He describes it as "things spinning".  Worse with certain movements of his head.  No associated nausea or vomiting.  He states that he is better than he was but he is still not back to normal.  No vision changes.  Denies weakness or other neurological symptoms.  No other associated symptoms.  No other complaints at this time.  PMH, Surgical Hx, Family Hx, Social History reviewed and updated as below.  Past Medical History:  Diagnosis Date  . CAD (coronary artery disease)    a. 08/2017 STEMI/Cath: LM 80ost/d, LAD 100p, LCX 95 ost/p, RCA 60p, 65m, RPDA 80ost; b. 08/2017 CABG x 3: LIMA->LAD, VG->OM, VG->PDA.  Marland Kitchen Essential hypertension   . HFrEF (heart failure with reduced ejection fraction) ([place])    a. 08/2017 TEE: EF 30-35%, sept/ant/inf HK, apical AK. 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  . Hyperlipidemia LDL goal <70   . Ischemic cardiomyopathy    a. 08/2017 TEE: EF 30-35%.    Patient Active Problem List   Diagnosis Date Noted  . 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  . Coronary artery disease 08/28/2017  . 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 [facility]     Past Surgical History:  Procedure Laterality Date  . CORONARY ARTERY BYPASS GRAFT N/A 08/27/2017   Procedure: CORONARY ARTERY BYPASS GRAFTING (CABG) ON PUMP USING LEFT INTERNAL MAMMARY ARTERY AND LEFT GREATER SAPHENOUS VEIN VIA ENDOVEIN HARVEST;  Surgeon: Gaye Pollack, MD;  Location: [place];  Service: Open Heart Surgery;  Laterality: N/A;  . CORONARY/GRAFT ACUTE MI REVASCULARIZATION N/A 08/27/2017   Procedure: Coronary/Graft Acute MI Revascularization;  Surgeon: Wellington Hampshire, MD;  Location: [place] [facility];  Service: Cardiovascular;  Laterality: N/A;  . LEFT HEART CATH AND CORONARY ANGIOGRAPHY N/A 08/27/2017   Procedure: LEFT HEART CATH AND CORONARY ANGIOGRAPHY;  Surgeon: Wellington Hampshire, MD;  Location: [place] [facility];  Service: Cardiovascular;  Laterality: N/A;     Home Medications    Prior to Admission medications   Medication Sig Start Date End Date Taking? Authorizing Provider  aspirin EC 81 MG EC tablet Take 1 tablet (81 mg total) by mouth daily. 09/03/17  Yes Gold, Wayne E, PA-C  atorvastatin (LIPITOR) 80 MG tablet Take 1 tablet (80 mg total) by mouth daily. 09/19/17 02/27/18 Yes Theora Gianotti, NP  carvedilol (COREG) 12.5 MG tablet Take 1 tablet (12.5 mg total) by mouth 2 (two) times daily with a meal. 09/19/17  Yes Theora Gianotti, NP  clopidogrel (PLAVIX) 75 MG tablet Take 1 tablet (75 mg total) by mouth daily. 09/19/17  Yes Theora Gianotti, NP  losartan (COZAAR) 50 MG tablet Take 1 tablet (50 mg total) by mouth daily. 09/19/17  Yes Theora Gianotti, NP  Multiple Vitamin (MULTIVITAMIN WITH MINERALS) TABS tablet Take 1 tablet by mouth daily. One a Day over 50   Yes [provider]  meclizine (ANTIVERT) 25 MG tablet Take 1 tablet (25 mg total) by mouth 3 (three) times daily as needed for dizziness. 02/27/18   Coral Spikes, DO    Family History Family History  Problem Relation Age of Onset  .  Heart failure Mother   . Lung disease Father   . Stroke Father     Social History Social History   Tobacco Use  . Smoking status: Never Smoker  . Smokeless tobacco: Current User    Types: Snuff  Substance Use Topics  . Alcohol use: Not Currently  . Drug use: Not Currently     Allergies   Patient has no known allergies.   Review of Systems Review of Systems  Eyes: Negative.   Neurological: Positive for dizziness.   Physical Exam Triage Vital Signs ED Triage Vitals  Enc Vitals Group     BP 02/27/18 1256 (!) 159/90      Pulse Rate 02/27/18 1256 73     Resp 02/27/18 1256 16     Temp 02/27/18 1256 98 F (36.7 C)     Temp Source 02/27/18 1256 Oral     SpO2 02/27/18 1256 99 %     Weight 02/27/18 1257 235 lb (106.6 kg)     Height 02/27/18 1257 6' (1.829 m)     Head Circumference --      Peak Flow --      Pain Score 02/27/18 1256 0     Pain Loc --      Pain Edu? --      Excl. in [place]? --    Updated Vital Signs BP (!) 159/90 (BP Location: Left Arm)   Pulse 73   Temp 98 F (36.7 C) (Oral)   Resp 16   Ht 6' (1.829 m)   Wt 106.6 kg   SpO2 99%   BMI 31.87 kg/m   Visual Acuity Right Eye Distance:   Left Eye Distance:   Bilateral Distance:    Right Eye Near:   Left Eye Near:    Bilateral Near:     Physical Exam Vitals signs and nursing note reviewed.  Constitutional:      General: He is not in acute distress. HENT:     Head: Normocephalic and atraumatic.     Mouth/Throat:     Pharynx: Oropharynx is clear. No posterior oropharyngeal erythema.  Eyes:     Extraocular Movements: Extraocular movements intact.     Pupils: Pupils are equal, round, and reactive to light.  Cardiovascular:     Rate and Rhythm: Normal rate and regular rhythm.     Heart sounds: Murmur present.  Pulmonary:     Effort: Pulmonary effort is normal.     Breath sounds: No wheezing, rhonchi or rales.  Neurological:     Mental Status: He is alert.     Comments: + Dix-Hallpike.  Psychiatric:        Mood and Affect: Mood normal.        Behavior: Behavior normal.    UC Treatments / Results  Labs (all labs ordered are listed, but only abnormal results are displayed) Labs Reviewed - No data to display  EKG None  Radiology No results found.  Procedures Procedures (including critical care time)  Medications Ordered in UC Medications - No data to display  Initial Impression / Assessment and Plan / UC Course  I have reviewed the triage vital signs and the nursing notes.  Pertinent labs & imaging results that  were available during my care of the patient were reviewed by me and considered in my medical decision making (see chart for details).    73 year old male presents with vertigo.  Meclizine as needed.  Advised Epley maneuver.  Supportive care.  Final Clinical Impressions(s) /  UC Diagnoses   Final diagnoses:  Vertigo   Discharge Instructions   None    ED Prescriptions    Medication Sig Dispense Auth. Provider   meclizine (ANTIVERT) 25 MG tablet Take 1 tablet (25 mg total) by mouth 3 (three) times daily as needed for dizziness. 30 tablet Coral Spikes, DO     Controlled Substance Prescriptions [place] Controlled Substance Registry consulted? Not Applicable   Coral Spikes, DO 02/27/18 1514

## 2018-02-27 NOTE — ED Triage Notes (Addendum)
Patient in today c/o dizziness x 1 week. Patient states it was bad for 3 days and went away. Patient states the dizziness returned yesterday.

## 2018-04-06 ENCOUNTER — Telehealth: Payer: Self-pay | Admitting: Nurse Practitioner

## 2018-04-06 NOTE — Telephone Encounter (Signed)
Left voicemail message to call back  

## 2018-04-06 NOTE — Telephone Encounter (Signed)
Pt c/o of Chest Pain: STAT if CP now or developed within 24 hours  1. Are you having CP right now? yes  2. Are you experiencing any other symptoms (ex. SOB, nausea, vomiting, sweating)? No other symptoms, says it feels like a pulled muscle  3. How long have you been experiencing CP? Since yesterday  4. Is your CP continuous or coming and going? continual  5. Have you taken Nitroglycerin? no ?

## 2018-04-06 NOTE — Telephone Encounter (Signed)
Patient returning call.

## 2018-04-07 ENCOUNTER — Emergency Department: Payer: Medicare Other

## 2018-04-07 ENCOUNTER — Encounter: Payer: Self-pay | Admitting: Medical Oncology

## 2018-04-07 ENCOUNTER — Emergency Department
Admission: EM | Admit: 2018-04-07 | Discharge: 2018-04-07 | Disposition: A | Discharge status: Home / Self Care | Payer: Medicare Other | Attending: Emergency Medicine | Admitting: Emergency Medicine

## 2018-04-07 DIAGNOSIS — Z79899 Other long term (current) drug therapy: Secondary | ICD-10-CM | POA: Insufficient documentation

## 2018-04-07 DIAGNOSIS — R079 Chest pain, unspecified: Secondary | ICD-10-CM | POA: Diagnosis not present

## 2018-04-07 DIAGNOSIS — I10 Essential (primary) hypertension: Secondary | ICD-10-CM | POA: Diagnosis not present

## 2018-04-07 DIAGNOSIS — Z951 Presence of aortocoronary bypass graft: Secondary | ICD-10-CM | POA: Diagnosis not present

## 2018-04-07 DIAGNOSIS — F17228 Nicotine dependence, chewing tobacco, with other nicotine-induced disorders: Secondary | ICD-10-CM | POA: Insufficient documentation

## 2018-04-07 DIAGNOSIS — Z7982 Long term (current) use of aspirin: Secondary | ICD-10-CM | POA: Diagnosis not present

## 2018-04-07 DIAGNOSIS — I2510 Atherosclerotic heart disease of native coronary artery without angina pectoris: Secondary | ICD-10-CM | POA: Insufficient documentation

## 2018-04-07 DIAGNOSIS — R0789 Other chest pain: Secondary | ICD-10-CM | POA: Insufficient documentation

## 2018-04-07 DIAGNOSIS — I252 Old myocardial infarction: Secondary | ICD-10-CM | POA: Insufficient documentation

## 2018-04-07 LAB — CBC
HEMATOCRIT: 43 % (ref 39.0–52.0)
Hemoglobin: 14.4 g/dL (ref 13.0–17.0)
MCH: 30.1 pg (ref 26.0–34.0)
MCHC: 33.5 g/dL (ref 30.0–36.0)
MCV: 89.8 fL (ref 80.0–100.0)
Platelets: 168 10*3/uL (ref 150–400)
RBC: 4.79 MIL/uL (ref 4.22–5.81)
RDW: 12.8 % (ref 11.5–15.5)
WBC: 7.2 10*3/uL (ref 4.0–10.5)
nRBC: 0 % (ref 0.0–0.2)

## 2018-04-07 LAB — BASIC METABOLIC PANEL
Anion gap: 5 (ref 5–15)
BUN: 16 mg/dL (ref 8–23)
CO2: 25 mmol/L (ref 22–32)
Calcium: 8.8 mg/dL — ABNORMAL LOW (ref 8.9–10.3)
Chloride: 108 mmol/L (ref 98–111)
Creatinine, Ser: 1.15 mg/dL (ref 0.61–1.24)
GFR calc Af Amer: >60 mL/min (ref >60)
GFR calc non Af Amer: >60 mL/min (ref >60)
GLUCOSE: 100 mg/dL — AB (ref 70–99)
POTASSIUM: 4.1 mmol/L (ref 3.5–5.1)
Sodium: 138 mmol/L (ref 135–145)

## 2018-04-07 LAB — TROPONIN I
Troponin I: <0.03 ng/mL (ref <0.03)
Troponin I: <0.03 ng/mL (ref <0.03)

## 2018-04-07 MED ORDER — SODIUM CHLORIDE 0.9% FLUSH: 3.0000 mL | Freq: Once | INTRAVENOUS | Status: DC

## 2018-04-07 NOTE — Telephone Encounter (Signed)
I spoke with patient as well and took him via wheel chair to the ED for evaluation.

## 2018-04-07 NOTE — Telephone Encounter (Signed)
Patient in lobby c/o 3/10 chest pain and states noone from office has called him back to address.     Patient notified lmov x 3 and Dr. Marisue Humble nurse will come advise patient on dr. Marisue Humble advise for patient to go to the ED with Active chest pain.

## 2018-04-07 NOTE — Discharge Instructions (Addendum)
This looks like you did pull a muscle in your chest.  You can use Tylenol or maybe a little Advil for the pain.  Please return for any worsening or shortness of breath or any other problems.  Please follow-up with your cardiologist this coming week.

## 2018-04-07 NOTE — ED Provider Notes (Signed)
[facility] Emergency Department Provider Note   ____________________________________________   First MD Initiated Contact with Patient 04/07/18 1110     (approximate)  I have reviewed the triage vital signs and the nursing notes.   HISTORY  Chief Complaint Chest Pain    HPI Cory Hess is a 73 y.o. male who reports he was working in the barn 3 days ago doing a lot more work and usually does when he got down he felt a tightness in his chest around the left pectoral area he thought he maybe pulled something.  It was tender to palpation.  He says he did more work that day than he did since he had his bypass surgery in July last year.  Pain is improved gradually over the next couple days.  Is still little bit achy.  He thinks maybe he pulled something off his ribs pulled a muscle so he came in to be checked.  Pain is worse with movement.  Is not really worse with deep breathing.  He is not short of breath.  He can walk around and does not make the pain worse.   Past Medical History:  Diagnosis Date  . CAD (coronary artery disease)    a. 08/2017 STEMI/Cath: LM 80ost/d, LAD 100p, LCX 95 ost/p, RCA 60p, 70m, RPDA 80ost; b. 08/2017 CABG x 3: LIMA->LAD, VG->OM, VG->PDA.  Marland Kitchen Essential hypertension   . HFrEF (heart failure with reduced ejection fraction) ([place])    a. 08/2017 TEE: EF 30-35%, sept/ant/inf HK, apical AK. 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  . Hyperlipidemia LDL goal <70   . Ischemic cardiomyopathy    a. 08/2017 TEE: EF 30-35%.    Patient Active Problem List   Diagnosis Date Noted  . 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  . Coronary artery disease 08/28/2017  . 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 CORONARY ARTERY BYPASS GRAFT N/A 08/27/2017   Procedure: CORONARY ARTERY BYPASS GRAFTING (CABG) ON PUMP USING LEFT INTERNAL MAMMARY ARTERY AND LEFT GREATER SAPHENOUS VEIN VIA ENDOVEIN  HARVEST;  Surgeon: Gaye Pollack, MD;  Location: [place];  Service: Open Heart Surgery;  Laterality: N/A;  . CORONARY/GRAFT ACUTE MI REVASCULARIZATION N/A 08/27/2017   Procedure: Coronary/Graft Acute MI Revascularization;  Surgeon: Wellington Hampshire, MD;  Location: [place] [facility];  Service: Cardiovascular;  Laterality: N/A;  . LEFT HEART CATH AND CORONARY ANGIOGRAPHY N/A 08/27/2017   Procedure: LEFT HEART CATH AND CORONARY ANGIOGRAPHY;  Surgeon: Wellington Hampshire, MD;  Location: [place] [facility];  Service: Cardiovascular;  Laterality: N/A;    Prior to Admission medications   Medication Sig Start Date End Date Taking? Authorizing Provider  aspirin EC 81 MG EC tablet Take 1 tablet (81 mg total) by mouth daily. 09/03/17  Yes Gold, Wayne E, PA-C  atorvastatin (LIPITOR) 80 MG tablet Take 1 tablet (80 mg total) by mouth daily. 09/19/17 04/07/18 Yes Theora Gianotti, NP  carvedilol (COREG) 12.5 MG tablet Take 1 tablet (12.5 mg total) by mouth 2 (two) times daily with a meal. 09/19/17  Yes Theora Gianotti, NP  clopidogrel (PLAVIX) 75 MG tablet Take 1 tablet (75 mg total) by mouth daily. 09/19/17  Yes Theora Gianotti, NP  losartan (COZAAR) 50 MG tablet Take 1 tablet (50 mg total) by mouth daily. 09/19/17  Yes Theora Gianotti, NP  Multiple Vitamin (MULTIVITAMIN WITH MINERALS) TABS tablet Take 1 tablet  by mouth daily. One a Day over 50   Yes [provider]  meclizine (ANTIVERT) 25 MG tablet Take 1 tablet (25 mg total) by mouth 3 (three) times daily as needed for dizziness. 02/27/18   Coral Spikes, DO    Allergies Patient has no known allergies.  Family History  Problem Relation Age of Onset  . Heart failure Mother   . Lung disease Father   . Stroke Father     Social History Social History   Tobacco Use  . Smoking status: Never Smoker  . Smokeless tobacco: Current User    Types: Snuff  Substance Use Topics  . Alcohol use: Not Currently  . Drug  use: Not Currently    Review of Systems  Constitutional: No fever/chills Eyes: No visual changes. ENT: No sore throat. Cardiovascular: See HPI Respiratory: Denies shortness of breath. Gastrointestinal: No abdominal pain.  No nausea, no vomiting.  No diarrhea.  No constipation. Genitourinary: Negative for dysuria. Musculoskeletal: Negative for back pain. Skin: Negative for rash. Neurological: Negative for headaches, focal weakness  ____________________________________________   PHYSICAL EXAM:  VITAL SIGNS: ED Triage Vitals  Enc Vitals Group     BP 04/07/18 1040 (!) 142/75     Pulse Rate 04/07/18 1040 66     Resp 04/07/18 1040 16     Temp 04/07/18 1040 (!) 97.5 F (36.4 C)     Temp Source 04/07/18 1040 Oral     SpO2 04/07/18 1040 98 %     Weight 04/07/18 1041 233 lb 11 oz (106 kg)     Height 04/07/18 1041 6' (1.829 m)     Head Circumference --      Peak Flow --      Pain Score 04/07/18 1041 2     Pain Loc --      Pain Edu? --      Excl. in [place]? --     Constitutional: Alert and oriented. Well appearing and in no acute distress. Eyes: Conjunctivae are normal. Head: Atraumatic. Nose: No congestion/rhinnorhea. Mouth/Throat: Mucous membranes are moist.  Oropharynx non-erythematous. Neck: No stridor.  Cardiovascular: Normal rate, regular rhythm. Grossly normal heart sounds.  Good peripheral circulation. Respiratory: Normal respiratory effort.  No retractions. Lungs CTAB.  No tenderness on palpation chest wall currently. Gastrointestinal: Soft and nontender. No distention. No abdominal bruits. No CVA tenderness. Musculoskeletal: No lower extremity tenderness nor edema.  No joint effusions. Neurologic:  Normal speech and language. No gross focal neurologic deficits are appreciated. No gait instability. Skin:  Skin is warm, dry and intact. No rash noted. Psychiatric: Mood and affect are normal. Speech and behavior are normal.  ____________________________________________     LABS (all labs ordered are listed, but only abnormal results are displayed)  Labs Reviewed  BASIC METABOLIC PANEL - Abnormal; Notable for the following components:      Result Value   Glucose, Bld 100 (*)    Calcium 8.8 (*)    All other components within normal limits  CBC  TROPONIN I  TROPONIN I   ____________________________________________  EKG  EKG read interpreted by me shows normal sinus rhythm rate of 66 left axis left bundle branch block looks about the same as one from November of last year.  EKG #2 chest done at 1115 shows normal sinus rhythm rate of 64 left axis left bundle branch block but the flipped T waves in 2-1/2 have resolved completely.  This is somewhat worrisome.  The patient now has no chest pain.  ____________________________________________  RADIOLOGY  ED MD interpretation: Test x-ray read by radiology reviewed by me shows no acute changes  Official radiology report(s): Dg Chest 2 View  Result Date: 04/07/2018 CLINICAL DATA:  Chest pain EXAM: CHEST - 2 VIEW COMPARISON:  October 03, 2017 FINDINGS: There is no appreciable edema or consolidation. Heart size and pulmonary vascularity are normal. No adenopathy. Patient is status post coronary artery bypass grafting. No bone lesions. No pneumothorax. IMPRESSION: Postoperative change.  No edema or consolidation. Electronically Signed   By: Lowella Grip [name] M.D.   On: 04/07/2018 11:23    ____________________________________________   PROCEDURES  Procedure(s) performed:   Procedures  Critical Care performed:   ____________________________________________   INITIAL IMPRESSION / ASSESSMENT AND PLAN / ED COURSE    EKG #2 slightly different than EKG #1 discussed in great detail with Dr. Rockey Situ.  Second troponin is negative.  Symptoms been going on for 3 days almost constantly.  Sounds most like chest wall pain his symptoms are always reproducible by palpation.  We will discharge the patient home he  will have cardiology follow-up.       ____________________________________________   FINAL CLINICAL IMPRESSION(S) / ED DIAGNOSES  Final diagnoses:  Chest wall pain     ED Discharge Orders    None       Note:  This document was prepared using Dragon voice recognition software and may include unintentional dictation errors.    Nena Polio, MD 04/07/18 1356

## 2018-04-07 NOTE — ED Triage Notes (Signed)
Pt reports pain to left side chest that began on 2/12. Pt states that the pain feels like a pulled muscle. Pt has hx of recent cardiac bybass. Pt in NAD at this time. Pain worsens with movement.

## 2018-05-04 ENCOUNTER — Ambulatory Visit: Payer: Medicare Other | Admitting: Internal Medicine

## 2018-05-04 DIAGNOSIS — H2512 Age-related nuclear cataract, left eye: Secondary | ICD-10-CM | POA: Diagnosis not present

## 2018-05-09 ENCOUNTER — Encounter: Payer: Self-pay | Admitting: Anesthesiology

## 2018-05-09 ENCOUNTER — Other Ambulatory Visit: Payer: Self-pay

## 2018-05-09 ENCOUNTER — Encounter: Payer: Self-pay | Admitting: *Deleted

## 2018-05-16 ENCOUNTER — Ambulatory Visit: Admission: RE | Admit: 2018-05-16 | Payer: Medicare Other | Source: Home / Self Care | Admitting: Ophthalmology

## 2018-05-16 HISTORY — DX: Dizziness and giddiness: R42

## 2018-05-16 HISTORY — DX: Unspecified osteoarthritis, unspecified site: M19.90

## 2018-05-16 HISTORY — DX: Zoster without complications: B02.9

## 2018-05-16 SURGERY — PHACOEMULSIFICATION, CATARACT, WITH IOL INSERTION
Anesthesia: Topical | Laterality: Left

## 2018-05-19 ENCOUNTER — Other Ambulatory Visit: Payer: Self-pay | Admitting: *Deleted

## 2018-05-19 MED ORDER — CLOPIDOGREL BISULFATE 75 MG PO TABS: 75.0000 mg | ORAL_TABLET | Freq: Every day | ORAL | 0 refills | Status: DC

## 2018-05-24 ENCOUNTER — Telehealth: Payer: Self-pay

## 2018-05-24 NOTE — Telephone Encounter (Signed)
Call attempted to reschedule patient to a telephone or video visit instead of the scheduled face to face visit with Christell Faith, PA on 06/06/2018 due to current clinic policies related to COVID 19 virus precautions. Unable to leave message-"subscriber not accepting calls at this time".

## 2018-05-25 ENCOUNTER — Telehealth: Payer: Self-pay

## 2018-05-25 NOTE — Telephone Encounter (Signed)
Left voicemail message requesting for patient to call back regarding 06/06/18 office visit with Christell Faith, PA. Informed him that we are not seeing patients in the office at this time due to [place] 19 virus precautions implemented at our clinic however we would like to reschedule the visit as a telephone or video call.

## 2018-05-30 NOTE — Telephone Encounter (Signed)
Attempted to call patient. LMTCB 05/30/2018

## 2018-05-31 ENCOUNTER — Telehealth: Payer: Self-pay | Admitting: Physician Assistant

## 2018-05-31 NOTE — Telephone Encounter (Signed)
Scheduled

## 2018-05-31 NOTE — Telephone Encounter (Signed)
Virtual Visit Pre-Appointment Phone Call  Steps For Call:  1. Confirm consent - "In the setting of the current Covid19 crisis, you are scheduled for a (phone or video) visit with your provider on (date) at (time).  Just as we do with many in-office visits, in order for you to participate in this visit, we must obtain consent.  If you'd like, I can send this to your mychart (if signed up) or email for you to review.  Otherwise, I can obtain your verbal consent now.  All virtual visits are billed to your insurance company just like a normal visit would be.  By agreeing to a virtual visit, we'd like you to understand that the technology does not allow for your provider to perform an examination, and thus may limit your provider's ability to fully assess your condition.  Finally, though the technology is pretty good, we cannot assure that it will always work on either your or our end, and in the setting of a video visit, we may have to convert it to a phone-only visit.  In either situation, we cannot ensure that we have a secure connection.  Are you willing to proceed?"  2. Give patient instructions for WebEx download to smartphone as below if video visit  3. Advise patient to be prepared with any vital sign or heart rhythm information, their current medicines, and a piece of paper and pen handy for any instructions they may receive the day of their visit  4. Inform patient they will receive a phone call 15 minutes prior to their appointment time (may be from unknown caller ID) so they should be prepared to answer  5. Confirm that appointment type is correct in Epic appointment notes (video vs telephone)    TELEPHONE CALL NOTE  Cory Hess has been deemed a candidate for a follow-up tele-health visit to limit community exposure during the Covid-19 pandemic. I spoke with the patient via phone to ensure availability of phone/video source, confirm preferred email & phone number, and discuss  instructions and expectations.  I reminded Cory Hess to be prepared with any vital sign and/or heart rhythm information that could potentially be obtained via home monitoring, at the time of his visit. I reminded Cory Hess to expect a phone call at the time of his visit if his visit.  Did the patient verbally acknowledge consent to treatment? Yes   Ace Gins 05/31/2018 3:16 PM   DOWNLOADING THE [place], go to [employer] and type in WebEx in the search bar. [place] [employer], the blue/green circle. The app is free but as with any other app downloads, their phone may require them to verify saved payment information or Apple password. The patient does NOT have to create an account.  - If Android, ask patient to go to [employer] and type in WebEx in the search bar. [place] [employer], the blue/green circle. The app is free but as with any other app downloads, their phone may require them to verify saved payment information or Android password. The patient does NOT have to create an account.   CONSENT FOR TELE-HEALTH VISIT - PLEASE REVIEW  I hereby voluntarily request, consent and authorize [facility] and its employed or contracted physicians, physician assistants, nurse practitioners or other licensed health care professionals (the Practitioner), to provide me with telemedicine health care services (the Services") as deemed necessary by the treating Practitioner. I acknowledge and consent  to receive the Services by the Practitioner via telemedicine. I understand that the telemedicine visit will involve communicating with the Practitioner through live audiovisual communication technology and the disclosure of certain medical information by electronic transmission. I acknowledge that I have been given the opportunity to request an in-person assessment or other available alternative prior to the telemedicine visit and am  voluntarily participating in the telemedicine visit.  I understand that I have the right to withhold or withdraw my consent to the use of telemedicine in the course of my care at any time, without affecting my right to future care or treatment, and that the Practitioner or I may terminate the telemedicine visit at any time. I understand that I have the right to inspect all information obtained and/or recorded in the course of the telemedicine visit and may receive copies of available information for a reasonable fee.  I understand that some of the potential risks of receiving the Services via telemedicine include:   Delay or interruption in medical evaluation due to technological equipment failure or disruption;  Information transmitted may not be sufficient (e.g. poor resolution of images) to allow for appropriate medical decision making by the Practitioner; and/or   In rare instances, security protocols could fail, causing a breach of personal health information.  Furthermore, I acknowledge that it is my responsibility to provide information about my medical history, conditions and care that is complete and accurate to the best of my ability. I acknowledge that Practitioner's advice, recommendations, and/or decision may be based on factors not within their control, such as incomplete or inaccurate data provided by me or distortions of diagnostic images or specimens that may result from electronic transmissions. I understand that the practice of medicine is not an exact science and that Practitioner makes no warranties or guarantees regarding treatment outcomes. I acknowledge that I will receive a copy of this consent concurrently upon execution via email to the email address I last provided but may also request a printed copy by calling the office of [facility].    I understand that my insurance will be billed for this visit.   I have read or had this consent read to me.  I understand the  contents of this consent, which adequately explains the benefits and risks of the Services being provided via telemedicine.   I have been provided ample opportunity to ask questions regarding this consent and the Services and have had my questions answered to my satisfaction.  I give my informed consent for the services to be provided through the use of telemedicine in my medical care  By participating in this telemedicine visit I agree to the above.

## 2018-06-05 NOTE — Progress Notes (Signed)
Virtual Visit via Telephone Note   This visit type was conducted due to national recommendations for restrictions regarding the COVID-19 Pandemic (e.g. social distancing) in an effort to limit this patient's exposure and mitigate transmission in our community.  Due to his co-morbid illnesses, this patient is at least at moderate risk for complications without adequate follow up.  This format is felt to be most appropriate for this patient at this time.  The patient did not have access to video technology/had technical difficulties with video requiring transitioning to audio format only (telephone).  All issues noted in this document were discussed and addressed.  No physical exam could be performed with this format.  Please refer to the patient's chart for his  consent to telehealth for [facility].   Evaluation Performed:  Follow-up visit  Date:  06/06/2018   ID:  Cory Hess, DOB August 11, 1945, MRN [id number]  Patient Location: Home  Provider Location: Home  PCP:  Patient, No Pcp Per  Cardiologist:  Nelva Bush, MD  Electrophysiologist:  None   Chief Complaint:  Telehealth follow up   History of Present Illness:    Cory Hess is a 73 y.o. male who presents via audio/video conferencing for a telehealth visit today.  CAD status post three-vessel CABG with LIMA to LAD, SVG to OM, and SVG to PDA in 08/2017, HFrEF secondary to ICM, HTN, and HLD.  He was admitted in 08/2017 with chest pain and a new LBBB.  He underwent LHC which showed multivessel disease including 80% stenosis of the left main, proximal LAD 100% stenosis, ostial to proximal LCx 95% stenosis, proximal RCA 60% stenosis, mid RCA 90% stenosis, ostial RPDA 80% stenosis.  Recommendation was made balloon pump to be placed and for transfer to [facility] for urgent bypass surgery.  Intraoperative TEE showed an EF of 30 to 35%.  Postoperative course was relatively uncomplicated.  He was seen in hospital follow-up in 12/2017 and  was doing reasonably well without any significant cardiac complaints.  It was noted he declined cardiac rehab.  Repeat echo in 12/2017 showed improvement in his LV systolic function with an EF of 45 to 50%, moderate LVH, diffuse hypokinesis, grade 1 diastolic dysfunction, normal RV cavity size with mildly reduced RV systolic function, mildly dilated right atrium.   He was seen at an urgent care in 1/20 with vertigo.  He was seen in the ED on 04/07/2018 with chest pain after working in the barn more than usual that was sore to self palpation.  Patient stated it felt like a muscle.  EKG showed known left bundle.  Troponin negative x2.  Chest x-ray showed postoperative changes with no acute process.  Case was discussed with cardiology over the phone and felt to be musculoskeletal in etiology.  Outpatient follow-up was recommended.  Labs: 03/2017 - WBC 7.2, Hgb 14.4, PLT 168, potassium 4.1, serum creatinine 1.5 12/2017 - direct LDL 58, LFT normal  He is doing reasonably well since he was last seen in the office.  He reports no issues with fatigue, lethargy, or shortness of breath.  He continues to feel like his episode back in February was related to overdoing it in the barn.  He states he was lifting and throwing 50 pound bags of goat feed.  Approximately 2 to 3 weeks prior he was again lifting and throwing these bags of goat feed and again noticed soreness along the left anterior chest wall that was reproducible to his palpation and felt like a "  pulled muscle."  With rest, he has not had any further issues.  He is now no longer lifting or throwing these heavy bags of goat feed and has not had any further episodes.  He feels like he is doing well.  He does state to me he wants to "come off all of these medicines."  He denies any dizziness, presyncope, or syncope.  No lower extremity swelling, abdominal fullness, orthopnea, PND, or early satiety.  No falls, BRBPR, or melena.  The patient does not have symptoms  concerning for COVID-19 infection (fever, chills, cough, or new shortness of breath).    Past Medical History:  Diagnosis Date  . Arthritis    neck  . CAD (coronary artery disease)    a. 08/2017 STEMI/Cath: LM 80ost/d, LAD 100p, LCX 95 ost/p, RCA 60p, 39m, RPDA 80ost; b. 08/2017 CABG x 3: LIMA->LAD, VG->OM, VG->PDA.  Marland Kitchen Essential hypertension   . HFrEF (heart failure with reduced ejection fraction) ([place])    a. 08/2017 TEE: EF 30-35%, sept/ant/inf HK, apical AK. 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  . Hyperlipidemia LDL goal <70   . Ischemic cardiomyopathy    a. 08/2017 TEE: EF 30-35%.  . Shingles    2018 - right side  . Vertigo    Past Surgical History:  Procedure Laterality Date  . CORONARY ARTERY BYPASS GRAFT N/A 08/27/2017   Procedure: CORONARY ARTERY BYPASS GRAFTING (CABG) ON PUMP USING LEFT INTERNAL MAMMARY ARTERY AND LEFT GREATER SAPHENOUS VEIN VIA ENDOVEIN HARVEST;  Surgeon: Gaye Pollack, MD;  Location: [place] [facility];  Service: Open Heart Surgery;  Laterality: N/A;  . CORONARY/GRAFT ACUTE MI REVASCULARIZATION N/A 08/27/2017   Procedure: Coronary/Graft Acute MI Revascularization;  Surgeon: Wellington Hampshire, MD;  Location: [place] [facility];  Service: Cardiovascular;  Laterality: N/A;  . LEFT HEART CATH AND CORONARY ANGIOGRAPHY N/A 08/27/2017   Procedure: LEFT HEART CATH AND CORONARY ANGIOGRAPHY;  Surgeon: Wellington Hampshire, MD;  Location: [place] [facility];  Service: Cardiovascular;  Laterality: N/A;     Current Meds  Medication Sig  . aspirin EC 81 MG EC tablet Take 1 tablet (81 mg total) by mouth daily.  Marland Kitchen atorvastatin (LIPITOR) 80 MG tablet Take 1 tablet (80 mg total) by mouth daily.  . carvedilol (COREG) 12.5 MG tablet Take 1 tablet (12.5 mg total) by mouth 2 (two) times daily with a meal.  . clopidogrel (PLAVIX) 75 MG tablet Take 1 tablet (75 mg total) by mouth daily.  Marland Kitchen losartan (COZAAR) 50 MG tablet Take 1 tablet (50 mg total) by mouth daily.  . Multiple Vitamin (MULTIVITAMIN WITH  MINERALS) TABS tablet Take 1 tablet by mouth daily. One a Day over 50     Allergies:   Patient has no known allergies.   Social History   Tobacco Use  . Smoking status: Former Smoker    Types: Cigarettes    Last attempt to quit: 2000    Years since quitting: 20.2  . Smokeless tobacco: Current User    Types: Snuff  Substance Use Topics  . Alcohol use: Not Currently  . Drug use: Not Currently     Family Hx: The patient's family history includes Heart failure in his mother; Lung disease in his father; Stroke in his father.  ROS:   Please see the history of present illness.     All other systems reviewed and are negative.   Prior CV studies:   The following studies were reviewed today:  LHC 08/2017:  Ost LM  to Dist LM lesion is 80% stenosed.  Prox LAD lesion is 100% stenosed.  Ost Cx to Prox Cx lesion is 95% stenosed.  Prox RCA lesion is 60% stenosed.  Mid RCA lesion is 90% stenosed.  Ost RPDA to RPDA lesion is 80% stenosed.   1.  Suspected ST elevation myocardial infarction with presumed new left bundle branch block.  Severe left main stenosis which appears hazy suggestive of plaque rupture with chronic occlusion of mid LAD with collaterals from the right coronary artery, severe ostial left circumflex stenosis and severe RCA stenosis. 2.  Mildly elevated left ventricular end-diastolic pressure  at 17 mmHg.   3.  The LAD was probed with 2 wires and could not cross the occlusion which favors that this is a chronic occlusion. 4.  Successful intra-aortic balloon pump placement via the right common femoral artery.  Recommendations: I recommend urgent surgical revascularization. Recommend obtaining a stat echocardiogram upon arrival or the patient can have a transesophageal echocardiogram in the OR. The patient has not been given any antiplatelet medications other than aspirin.  continue heparin drip for intra-aortic balloon pump. __________  2D Echo 12/2017: - Left  ventricle: The cavity size was normal. Wall thickness was   increased increased in a pattern of mild to moderate LVH.   Systolic function was mildly reduced. The estimated ejection   fraction was in the range of 45% to 50%. Diffuse hypokinesis.   Regional wall motion abnormalities cannot be excluded. Doppler   parameters are consistent with abnormal left ventricular   relaxation (grade 1 diastolic dysfunction). Doppler parameters   are consistent with high ventricular filling pressure. - Right ventricle: The cavity size was normal. Systolic function   was mildly reduced. - Right atrium: The atrium was mildly dilated. __________  Labs/Other Tests and Data Reviewed:    EKG:  No ECG reviewed.  Recent Labs: 08/28/2017: Magnesium 2.3 12/28/2017: ALT 14 04/07/2018: BUN 16; Creatinine, Ser 1.15; Hemoglobin 14.4; Platelets 168; Potassium 4.1; Sodium 138   Recent Lipid Panel Lab Results  Component Value Date/Time   CHOL 104 12/28/2017 02:31 PM   TRIG 80 12/28/2017 02:31 PM   HDL 30 (L) 12/28/2017 02:31 PM   CHOLHDL 3.5 12/28/2017 02:31 PM   CHOLHDL 6.0 08/27/2017 04:42 PM   LDLCALC 58 12/28/2017 02:31 PM   LDLDIRECT 58 12/28/2017 02:31 PM    Wt Readings from Last 3 Encounters:  06/06/18 240 lb (108.9 kg)  04/07/18 233 lb 11 oz (106 kg)  02/27/18 235 lb (106.6 kg)     Objective:    Vital Signs:  Ht 6' (1.829 m)   Wt 240 lb (108.9 kg)   BMI 32.55 kg/m    Well nourished, well developed male in no acute distress.   ASSESSMENT & PLAN:    1. CAD status post three-vessel CABG in the setting of a STEMI: He has had 2 episodes of left sided chest wall pain with lifting and throwing 50 pound bags of goat feed.  Each of these episodes has been felt to be musculoskeletal in etiology.  With improved ergonomics he has had no further issues.  I have advised him he should not be lifting and throwing such heavy things.  Should he have return of symptoms we certainly could pursue noninvasive  ischemic evaluation though he prefers to defer this at this time given COVID-19 and social distancing restrictions.  He will remain on dual antiplatelet therapy, ARB, beta-blocker, and statin.  Aggressive risk factor modification and secondary prevention.  He does want to come off "all medicines."  I have advised him this is not possible given his coronary artery disease and ischemic cardiomyopathy.  I have outlined in detail watch of these medications are important to his heart and health.  He states he will continue them at this time.  This should be revisited with him in detail at his next in person follow-up.  2. HFrEF secondary to ICM: Subjectively, he denies any symptoms of volume overload.  Most recent echo showed improvement in LV systolic function as outlined above.  Continue beta-blocker and ARB.  I do not have recent blood pressure or heart rate therefore am unable to escalate either of these medications.  I have elected not to add spironolactone at this time given his LV systolic function is now 15% and in the setting of COVID-19 social distancing restrictions as this medication would require several trips to the [facility] for venipuncture to assess renal function and potassium.  I also am hesitant to entertain the idea that he add yet another medication when he is wanting to come off of his current regimen.  CHF education provided.  3. Hypertension: At last office visit blood pressure was well controlled.  Continue current medications as above.  4. Hyperlipidemia: Most recent direct LDL was at goal as above.  Remains on atorvastatin.  COVID-19 Education: The signs and symptoms of COVID-19 were discussed with the patient and how to seek care for testing (follow up with PCP or arrange E-visit).  The importance of social distancing was discussed today.  Time:   Today, I have spent 15 minutes with the patient with telehealth technology discussing the above problems.     Medication  Adjustments/Labs and Tests Ordered: Current medicines are reviewed at length with the patient today.  Concerns regarding medicines are outlined above.  Tests Ordered: No orders of the defined types were placed in this encounter.  Medication Changes: No orders of the defined types were placed in this encounter.   Disposition:  Follow up in 3 month(s)  Signed, Christell Faith, PA-C  06/06/2018 5:00 PM    [place]

## 2018-06-06 ENCOUNTER — Other Ambulatory Visit: Payer: Self-pay

## 2018-06-06 ENCOUNTER — Encounter: Payer: Self-pay | Admitting: Physician Assistant

## 2018-06-06 ENCOUNTER — Telehealth (INDEPENDENT_AMBULATORY_CARE_PROVIDER_SITE_OTHER): Payer: Medicare Other | Admitting: Physician Assistant

## 2018-06-06 VITALS — Ht 72.0 in | Wt 240.0 lb

## 2018-06-06 DIAGNOSIS — Z951 Presence of aortocoronary bypass graft: Secondary | ICD-10-CM

## 2018-06-06 DIAGNOSIS — I2510 Atherosclerotic heart disease of native coronary artery without angina pectoris: Secondary | ICD-10-CM

## 2018-06-06 DIAGNOSIS — I10 Essential (primary) hypertension: Secondary | ICD-10-CM

## 2018-06-06 DIAGNOSIS — E785 Hyperlipidemia, unspecified: Secondary | ICD-10-CM

## 2018-06-06 DIAGNOSIS — R0789 Other chest pain: Secondary | ICD-10-CM

## 2018-06-06 DIAGNOSIS — I255 Ischemic cardiomyopathy: Secondary | ICD-10-CM

## 2018-06-06 NOTE — Patient Instructions (Signed)
Medication Instructions:  Your physician recommends that you continue on your current medications as directed. Please refer to the Current Medication list given to you today.  If you need a refill on your cardiac medications before your next appointment, please call your pharmacy.   Lab work: None ordered If you have labs (blood work) drawn today and your tests are completely normal, you will receive your results only by: Marland Kitchen MyChart Message (if you have MyChart) OR . A paper copy in the mail If you have any lab test that is abnormal or we need to change your treatment, we will call you to review the results.  Testing/Procedures: None ordered  Follow-Up: At [facility], you and your health needs are our priority.  As part of our continuing mission to provide you with exceptional heart care, we have created designated Provider Care Teams.  These Care Teams include your primary Cardiologist (physician) and Advanced Practice Providers (APPs -  Physician Assistants and Nurse Practitioners) who all work together to provide you with the care you need, when you need it.  Your physician recommends that you schedule a follow-up appointment in: 3 months

## 2018-07-12 ENCOUNTER — Other Ambulatory Visit: Payer: Self-pay

## 2018-07-12 ENCOUNTER — Encounter: Payer: Self-pay | Admitting: *Deleted

## 2018-07-13 ENCOUNTER — Other Ambulatory Visit: Admission: RE | Admit: 2018-07-13 | Payer: Medicare Other | Source: Ambulatory Visit

## 2018-07-13 NOTE — Discharge Instructions (Signed)

## 2018-07-14 ENCOUNTER — Other Ambulatory Visit
Admission: RE | Admit: 2018-07-14 | Discharge: 2018-07-14 | Disposition: A | Discharge status: Home / Self Care | Payer: Medicare Other | Source: Ambulatory Visit | Attending: Ophthalmology | Admitting: Ophthalmology

## 2018-07-14 ENCOUNTER — Other Ambulatory Visit: Payer: Self-pay

## 2018-07-14 DIAGNOSIS — Z01812 Encounter for preprocedural laboratory examination: Secondary | ICD-10-CM | POA: Diagnosis not present

## 2018-07-14 DIAGNOSIS — Z1159 Encounter for screening for other viral diseases: Secondary | ICD-10-CM | POA: Diagnosis not present

## 2018-07-15 LAB — NOVEL CORONAVIRUS, NAA (HOSP ORDER, SEND-OUT TO REF LAB; TAT 18-24 HRS): SARS-CoV-2, NAA: NOT DETECTED

## 2018-07-18 ENCOUNTER — Encounter
Admission: RE | Disposition: A | Discharge status: Home / Self Care | Payer: Self-pay | Source: Home / Self Care | Attending: Ophthalmology

## 2018-07-18 ENCOUNTER — Ambulatory Visit: Payer: Medicare Other | Admitting: Anesthesiology

## 2018-07-18 ENCOUNTER — Ambulatory Visit
Admission: RE | Admit: 2018-07-18 | Discharge: 2018-07-18 | Disposition: A | Discharge status: Home / Self Care | Payer: Medicare Other | Attending: Ophthalmology | Admitting: Ophthalmology

## 2018-07-18 DIAGNOSIS — Z951 Presence of aortocoronary bypass graft: Secondary | ICD-10-CM | POA: Insufficient documentation

## 2018-07-18 DIAGNOSIS — H2512 Age-related nuclear cataract, left eye: Secondary | ICD-10-CM | POA: Insufficient documentation

## 2018-07-18 DIAGNOSIS — M1990 Unspecified osteoarthritis, unspecified site: Secondary | ICD-10-CM | POA: Insufficient documentation

## 2018-07-18 DIAGNOSIS — H25812 Combined forms of age-related cataract, left eye: Secondary | ICD-10-CM | POA: Diagnosis not present

## 2018-07-18 DIAGNOSIS — Z7982 Long term (current) use of aspirin: Secondary | ICD-10-CM | POA: Diagnosis not present

## 2018-07-18 DIAGNOSIS — I10 Essential (primary) hypertension: Secondary | ICD-10-CM | POA: Insufficient documentation

## 2018-07-18 DIAGNOSIS — Z87891 Personal history of nicotine dependence: Secondary | ICD-10-CM | POA: Insufficient documentation

## 2018-07-18 DIAGNOSIS — Z79899 Other long term (current) drug therapy: Secondary | ICD-10-CM | POA: Insufficient documentation

## 2018-07-18 DIAGNOSIS — I2510 Atherosclerotic heart disease of native coronary artery without angina pectoris: Secondary | ICD-10-CM | POA: Insufficient documentation

## 2018-07-18 DIAGNOSIS — I252 Old myocardial infarction: Secondary | ICD-10-CM | POA: Diagnosis not present

## 2018-07-18 HISTORY — PX: CATARACT EXTRACTION W/PHACO: SHX586

## 2018-07-18 SURGERY — PHACOEMULSIFICATION, CATARACT, WITH IOL INSERTION
Anesthesia: Monitor Anesthesia Care | Site: Eye | Laterality: Left

## 2018-07-18 MED ORDER — EPINEPHRINE PF 1 MG/ML IJ SOLN
INTRAOCULAR | Status: DC | PRN
  Administered 2018-07-18: 92 mL via OPHTHALMIC

## 2018-07-18 MED ORDER — SODIUM HYALURONATE 23 MG/ML IO SOLN
INTRAOCULAR | Status: DC | PRN
  Administered 2018-07-18: 0.6 mL via INTRAOCULAR

## 2018-07-18 MED ORDER — LACTATED RINGERS IV SOLN: 10.0000 mL/h | INTRAVENOUS | Status: DC

## 2018-07-18 MED ORDER — FENTANYL CITRATE (PF) 100 MCG/2ML IJ SOLN
INTRAMUSCULAR | Status: DC | PRN
  Administered 2018-07-18: 50 ug via INTRAVENOUS

## 2018-07-18 MED ORDER — MIDAZOLAM HCL 2 MG/2ML IJ SOLN
INTRAMUSCULAR | Status: DC | PRN
  Administered 2018-07-18: 2 mg via INTRAVENOUS

## 2018-07-18 MED ORDER — TETRACAINE HCL 0.5 % OP SOLN
1.0000 [drp] | OPHTHALMIC | Status: DC | PRN
  Administered 2018-07-18 (×3): 1 [drp] via OPHTHALMIC

## 2018-07-18 MED ORDER — ARMC OPHTHALMIC DILATING DROPS
1.0000 "application " | OPHTHALMIC | Status: DC | PRN
  Administered 2018-07-18 (×3): 1 via OPHTHALMIC

## 2018-07-18 MED ORDER — LIDOCAINE HCL (PF) 2 % IJ SOLN
INTRAOCULAR | Status: DC | PRN
  Administered 2018-07-18: 11:00:00 1 mL via INTRAOCULAR

## 2018-07-18 MED ORDER — MOXIFLOXACIN HCL 0.5 % OP SOLN
OPHTHALMIC | Status: DC | PRN
  Administered 2018-07-18: 0.2 mL via OPHTHALMIC

## 2018-07-18 MED ORDER — SODIUM HYALURONATE 10 MG/ML IO SOLN
INTRAOCULAR | Status: DC | PRN
  Administered 2018-07-18: 0.55 mL via INTRAOCULAR

## 2018-07-18 SURGICAL SUPPLY — 19 items
CANNULA ANT/CHMB 27G (MISCELLANEOUS) ×2 IMPLANT
CANNULA ANT/CHMB 27GA (MISCELLANEOUS) ×6 IMPLANT
DISSECTOR HYDRO NUCLEUS 50X22 (MISCELLANEOUS) ×3 IMPLANT
GLOVE SURG LX 7.5 STRW (GLOVE) ×2
GLOVE SURG LX STRL 7.5 STRW (GLOVE) ×1 IMPLANT
GLOVE SURG SYN 8.5  E (GLOVE) ×2
GLOVE SURG SYN 8.5 E (GLOVE) ×1 IMPLANT
GLOVE SURG SYN 8.5 PF PI (GLOVE) ×1 IMPLANT
GOWN STRL REUS W/ TWL LRG LVL3 (GOWN DISPOSABLE) ×2 IMPLANT
GOWN STRL REUS W/TWL LRG LVL3 (GOWN DISPOSABLE) ×4
LENS IOL TECNIS ITEC 17.5 (Intraocular Lens) ×2 IMPLANT
MARKER SKIN DUAL TIP RULER LAB (MISCELLANEOUS) ×3 IMPLANT
PACK DR. KING ARMS (PACKS) ×3 IMPLANT
PACK EYE AFTER SURG (MISCELLANEOUS) ×3 IMPLANT
PACK OPTHALMIC (MISCELLANEOUS) ×3 IMPLANT
SYR 3ML LL SCALE MARK (SYRINGE) ×3 IMPLANT
SYR TB 1ML LUER SLIP (SYRINGE) ×3 IMPLANT
WATER STERILE IRR 250ML POUR (IV SOLUTION) ×3 IMPLANT
WIPE NON LINTING 3.25X3.25 (MISCELLANEOUS) ×3 IMPLANT

## 2018-07-18 NOTE — Anesthesia Postprocedure Evaluation (Signed)
Anesthesia Post Note  Patient: Cory Hess  Procedure(s) Performed: CATARACT EXTRACTION PHACO AND INTRAOCULAR LENS PLACEMENT (IOC)  LEFT (Left Eye)  Patient location during evaluation: PACU Anesthesia Type: MAC Level of consciousness: awake and alert Pain management: pain level controlled Vital Signs Assessment: post-procedure vital signs reviewed and stable Respiratory status: spontaneous breathing Cardiovascular status: stable Anesthetic complications: no    [name], [name],  Donyell Carrell D

## 2018-07-18 NOTE — Transfer of Care (Signed)
Immediate Anesthesia Transfer of Care Note  Patient: Cory Hess  Procedure(s) Performed: CATARACT EXTRACTION PHACO AND INTRAOCULAR LENS PLACEMENT (IOC)  LEFT (Left Eye)  Patient Location: PACU  Anesthesia Type: MAC  Level of Consciousness: awake, alert  and patient cooperative  Airway and Oxygen Therapy: Patient Spontanous Breathing and Patient connected to supplemental oxygen  Post-op Assessment: Post-op Vital signs reviewed, Patient's Cardiovascular Status Stable, Respiratory Function Stable, Patent Airway and No signs of Nausea or vomiting  Post-op Vital Signs: Reviewed and stable  Complications: No apparent anesthesia complications

## 2018-07-18 NOTE — Op Note (Signed)
OPERATIVE NOTE  Cory Hess [id number] 07/18/2018   PREOPERATIVE DIAGNOSIS:  Nuclear sclerotic cataract left eye.  H25.12   POSTOPERATIVE DIAGNOSIS:    Nuclear sclerotic cataract left eye.     PROCEDURE:  Phacoemusification with posterior chamber intraocular lens placement of the left eye   LENS:   Implant Name Type Inv. Item Serial No. Manufacturer Lot No. LRB No. Used  LENS IOL DIOP 17.5 - [id number] Intraocular Lens LENS IOL DIOP 17.5 [id number] AMO  Left 1       PCB00 +17.5   ULTRASOUND TIME: 1 minutes 02 seconds.  CDE 7.33   SURGEON:  Benay Pillow, MD, MPH   ANESTHESIA:  Topical with tetracaine drops augmented with 1% preservative-free intracameral lidocaine.  ESTIMATED BLOOD LOSS: <1 mL   COMPLICATIONS:  None.   DESCRIPTION OF PROCEDURE:  The patient was identified in the holding room and transported to the operating room and placed in the supine position under the operating microscope.  The left eye was identified as the operative eye and it was prepped and draped in the usual sterile ophthalmic fashion.   A 1.0 millimeter clear-corneal paracentesis was made at the 5:00 position. 0.5 ml of preservative-free 1% lidocaine with epinephrine was injected into the anterior chamber.  The anterior chamber was filled with Healon 5 viscoelastic.  A 2.4 millimeter keratome was used to make a near-clear corneal incision at the 2:00 position.  A curvilinear capsulorrhexis was made with a cystotome and capsulorrhexis forceps.  Balanced salt solution was used to hydrodissect and hydrodelineate the nucleus.   Phacoemulsification was then used in stop and chop fashion to remove the lens nucleus and epinucleus.  The remaining cortex was then removed using the irrigation and aspiration handpiece. Healon was then placed into the capsular bag to distend it for lens placement.  A lens was then injected into the capsular bag.  The remaining viscoelastic was aspirated.   Wounds were hydrated  with balanced salt solution.  The anterior chamber was inflated to a physiologic pressure with balanced salt solution.  Intracameral vigamox 0.1 mL undiltued was injected into the eye and a drop placed onto the ocular surface.  No wound leaks were noted.  The patient was taken to the recovery room in stable condition without complications of anesthesia or surgery  Benay Pillow 07/18/2018, 11:28 AM

## 2018-07-18 NOTE — Anesthesia Preprocedure Evaluation (Signed)
Anesthesia Evaluation  Patient identified by MRN, date of birth, ID band Patient awake    Reviewed: Allergy & Precautions, H&P , NPO status , Patient's Chart, lab work & pertinent test results  Airway Mallampati: II  TM Distance: >3 FB Neck ROM: full    Dental  (+) Edentulous Upper, Missing   Pulmonary former smoker,    Pulmonary exam normal breath sounds clear to auscultation       Cardiovascular hypertension, On Medications + CAD and + Past MI  Normal cardiovascular exam Rhythm:regular Rate:Normal     Neuro/Psych negative neurological ROS     GI/Hepatic negative GI ROS, Neg liver ROS,   Endo/Other  negative endocrine ROS  Renal/GU negative Renal ROS  negative genitourinary   Musculoskeletal   Abdominal   Peds  Hematology negative hematology ROS (+)   Anesthesia Other Findings   Reproductive/Obstetrics negative OB ROS                             Anesthesia Physical Anesthesia Plan  ASA: [name]  Anesthesia Plan: MAC   Post-op Pain Management:    Induction:   PONV Risk Score and Plan:   Airway Management Planned:   Additional Equipment:   Intra-op Plan:   Post-operative Plan:   Informed Consent: I have reviewed the patients History and Physical, chart, labs and discussed the procedure including the risks, benefits and alternatives for the proposed anesthesia with the patient or authorized representative who has indicated his/her understanding and acceptance.       Plan Discussed with:   Anesthesia Plan Comments:         Anesthesia Quick Evaluation

## 2018-07-18 NOTE — Anesthesia Procedure Notes (Signed)
Procedure Name: MAC Performed by: Mack Alvidrez, CRNA Pre-anesthesia Checklist: Patient identified, Emergency Drugs available, Suction available, Timeout performed and Patient being monitored Patient Re-evaluated:Patient Re-evaluated prior to induction Oxygen Delivery Method: Nasal cannula Placement Confirmation: positive ETCO2       

## 2018-07-18 NOTE — H&P (Signed)

## 2018-07-19 ENCOUNTER — Encounter: Payer: Self-pay | Admitting: Ophthalmology

## 2018-08-10 ENCOUNTER — Other Ambulatory Visit
Admission: RE | Admit: 2018-08-10 | Discharge: 2018-08-10 | Disposition: A | Discharge status: Home / Self Care | Payer: Medicare Other | Source: Ambulatory Visit | Attending: Ophthalmology | Admitting: Ophthalmology

## 2018-08-10 ENCOUNTER — Other Ambulatory Visit: Payer: Self-pay

## 2018-08-10 DIAGNOSIS — Z1159 Encounter for screening for other viral diseases: Secondary | ICD-10-CM | POA: Diagnosis not present

## 2018-08-10 NOTE — Discharge Instructions (Signed)

## 2018-08-11 LAB — NOVEL CORONAVIRUS, NAA (HOSP ORDER, SEND-OUT TO REF LAB; TAT 18-24 HRS): SARS-CoV-2, NAA: NOT DETECTED

## 2018-08-14 ENCOUNTER — Ambulatory Visit: Payer: Medicare Other | Admitting: Anesthesiology

## 2018-08-14 ENCOUNTER — Encounter
Admission: RE | Disposition: A | Discharge status: Home / Self Care | Payer: Self-pay | Source: Home / Self Care | Attending: Ophthalmology

## 2018-08-14 ENCOUNTER — Ambulatory Visit
Admission: RE | Admit: 2018-08-14 | Discharge: 2018-08-14 | Disposition: A | Discharge status: Home / Self Care | Payer: Medicare Other | Attending: Ophthalmology | Admitting: Ophthalmology

## 2018-08-14 DIAGNOSIS — Z951 Presence of aortocoronary bypass graft: Secondary | ICD-10-CM | POA: Diagnosis not present

## 2018-08-14 DIAGNOSIS — H2511 Age-related nuclear cataract, right eye: Secondary | ICD-10-CM | POA: Diagnosis not present

## 2018-08-14 DIAGNOSIS — Z87891 Personal history of nicotine dependence: Secondary | ICD-10-CM | POA: Diagnosis not present

## 2018-08-14 DIAGNOSIS — I2510 Atherosclerotic heart disease of native coronary artery without angina pectoris: Secondary | ICD-10-CM | POA: Insufficient documentation

## 2018-08-14 DIAGNOSIS — M1990 Unspecified osteoarthritis, unspecified site: Secondary | ICD-10-CM | POA: Insufficient documentation

## 2018-08-14 DIAGNOSIS — E7800 Pure hypercholesterolemia, unspecified: Secondary | ICD-10-CM | POA: Diagnosis not present

## 2018-08-14 DIAGNOSIS — Z9849 Cataract extraction status, unspecified eye: Secondary | ICD-10-CM | POA: Diagnosis not present

## 2018-08-14 DIAGNOSIS — H25811 Combined forms of age-related cataract, right eye: Secondary | ICD-10-CM | POA: Diagnosis not present

## 2018-08-14 DIAGNOSIS — I10 Essential (primary) hypertension: Secondary | ICD-10-CM | POA: Insufficient documentation

## 2018-08-14 DIAGNOSIS — I252 Old myocardial infarction: Secondary | ICD-10-CM | POA: Diagnosis not present

## 2018-08-14 HISTORY — PX: CATARACT EXTRACTION W/PHACO: SHX586

## 2018-08-14 SURGERY — PHACOEMULSIFICATION, CATARACT, WITH IOL INSERTION
Anesthesia: Monitor Anesthesia Care | Site: Eye | Laterality: Right

## 2018-08-14 MED ORDER — SODIUM HYALURONATE 10 MG/ML IO SOLN
INTRAOCULAR | Status: DC | PRN
  Administered 2018-08-14: 0.55 mL via INTRAOCULAR

## 2018-08-14 MED ORDER — EPINEPHRINE PF 1 MG/ML IJ SOLN
INTRAOCULAR | Status: DC | PRN
  Administered 2018-08-14: 12:00:00 94 mL via OPHTHALMIC

## 2018-08-14 MED ORDER — MOXIFLOXACIN HCL 0.5 % OP SOLN
OPHTHALMIC | Status: DC | PRN
  Administered 2018-08-14: 0.2 mL via OPHTHALMIC

## 2018-08-14 MED ORDER — ARMC OPHTHALMIC DILATING DROPS
1.0000 "application " | OPHTHALMIC | Status: DC | PRN
  Administered 2018-08-14 (×3): 1 via OPHTHALMIC

## 2018-08-14 MED ORDER — SODIUM HYALURONATE 23 MG/ML IO SOLN
INTRAOCULAR | Status: DC | PRN
  Administered 2018-08-14: 0.6 mL via INTRAOCULAR

## 2018-08-14 MED ORDER — LIDOCAINE HCL (PF) 2 % IJ SOLN
INTRAOCULAR | Status: DC | PRN
  Administered 2018-08-14: 12:00:00 1 mL via INTRAOCULAR

## 2018-08-14 MED ORDER — FENTANYL CITRATE (PF) 100 MCG/2ML IJ SOLN
INTRAMUSCULAR | Status: DC | PRN
  Administered 2018-08-14: 50 ug via INTRAVENOUS

## 2018-08-14 MED ORDER — LACTATED RINGERS IV SOLN: INTRAVENOUS | Status: DC

## 2018-08-14 MED ORDER — MIDAZOLAM HCL 2 MG/2ML IJ SOLN
INTRAMUSCULAR | Status: DC | PRN
  Administered 2018-08-14: 2 mg via INTRAVENOUS

## 2018-08-14 MED ORDER — ACETAMINOPHEN 160 MG/5ML PO SOLN: 325.0000 mg | Freq: Once | ORAL | Status: DC

## 2018-08-14 MED ORDER — ACETAMINOPHEN 325 MG PO TABS: 325.0000 mg | ORAL_TABLET | Freq: Once | ORAL | Status: DC

## 2018-08-14 MED ORDER — TETRACAINE HCL 0.5 % OP SOLN
1.0000 [drp] | OPHTHALMIC | Status: DC | PRN
  Administered 2018-08-14 (×3): 1 [drp] via OPHTHALMIC

## 2018-08-14 MED ORDER — ONDANSETRON HCL 4 MG/2ML IJ SOLN: 4.0000 mg | Freq: Once | INTRAMUSCULAR | Status: DC | PRN

## 2018-08-14 SURGICAL SUPPLY — 19 items
CANNULA ANT/CHMB 27G (MISCELLANEOUS) ×2 IMPLANT
CANNULA ANT/CHMB 27GA (MISCELLANEOUS) ×6 IMPLANT
DISSECTOR HYDRO NUCLEUS 50X22 (MISCELLANEOUS) ×3 IMPLANT
GLOVE SURG LX 7.5 STRW (GLOVE) ×2
GLOVE SURG LX STRL 7.5 STRW (GLOVE) ×1 IMPLANT
GLOVE SURG SYN 8.5  E (GLOVE) ×2
GLOVE SURG SYN 8.5 E (GLOVE) ×1 IMPLANT
GLOVE SURG SYN 8.5 PF PI (GLOVE) ×1 IMPLANT
GOWN STRL REUS W/ TWL LRG LVL3 (GOWN DISPOSABLE) ×2 IMPLANT
GOWN STRL REUS W/TWL LRG LVL3 (GOWN DISPOSABLE) ×4
LENS IOL TECNIS ITEC 17.5 (Intraocular Lens) ×2 IMPLANT
MARKER SKIN DUAL TIP RULER LAB (MISCELLANEOUS) ×3 IMPLANT
PACK DR. KING ARMS (PACKS) ×3 IMPLANT
PACK EYE AFTER SURG (MISCELLANEOUS) ×3 IMPLANT
PACK OPTHALMIC (MISCELLANEOUS) ×3 IMPLANT
SYR 3ML LL SCALE MARK (SYRINGE) ×3 IMPLANT
SYR TB 1ML LUER SLIP (SYRINGE) ×3 IMPLANT
WATER STERILE IRR 250ML POUR (IV SOLUTION) ×3 IMPLANT
WIPE NON LINTING 3.25X3.25 (MISCELLANEOUS) ×3 IMPLANT

## 2018-08-14 NOTE — Anesthesia Postprocedure Evaluation (Signed)
Anesthesia Post Note  Patient: Cory Hess  Procedure(s) Performed: CATARACT EXTRACTION PHACO AND INTRAOCULAR LENS PLACEMENT (IOC)  RIGHT (Right Eye)  Patient location during evaluation: PACU Anesthesia Type: MAC Level of consciousness: awake and alert and oriented Pain management: satisfactory to patient Vital Signs Assessment: post-procedure vital signs reviewed and stable Respiratory status: spontaneous breathing, nonlabored ventilation and respiratory function stable Cardiovascular status: blood pressure returned to baseline and stable Postop Assessment: Adequate PO intake and No signs of nausea or vomiting Anesthetic complications: no    Raliegh Ip

## 2018-08-14 NOTE — Anesthesia Procedure Notes (Signed)
Procedure Name: MAC Performed by: Silas Muff, CRNA Pre-anesthesia Checklist: Patient identified, Emergency Drugs available, Suction available, Timeout performed and Patient being monitored Patient Re-evaluated:Patient Re-evaluated prior to induction Oxygen Delivery Method: Nasal cannula Placement Confirmation: positive ETCO2       

## 2018-08-14 NOTE — Op Note (Signed)
OPERATIVE NOTE  Cory Hess [id number] 08/14/2018   PREOPERATIVE DIAGNOSIS:  Nuclear sclerotic cataract right eye.  H25.11   POSTOPERATIVE DIAGNOSIS:    Nuclear sclerotic cataract right eye.     PROCEDURE:  Phacoemusification with posterior chamber intraocular lens placement of the right eye   LENS:   Implant Name Type Inv. Item Serial No. Manufacturer Lot No. LRB No. Used Action  LENS IOL DIOP 17.5 - [id number] Intraocular Lens LENS IOL DIOP 17.5 [id number] AMO  Right 1 Wasted  IMPLANT RECORD       1 Implanted       PCB00 +17.5   ULTRASOUND TIME: 1 minutes 0 seconds.  CDE 4.89   SURGEON:  Benay Pillow, MD, MPH  ANESTHESIOLOGIST: Anesthesiologist: Ronelle Nigh, MD CRNA: Mayme Genta, CRNA   ANESTHESIA:  Topical with tetracaine drops augmented with 1% preservative-free intracameral lidocaine.  ESTIMATED BLOOD LOSS: less than 1 mL.   COMPLICATIONS:  None.   DESCRIPTION OF PROCEDURE:  The patient was identified in the holding room and transported to the operating room and placed in the supine position under the operating microscope.  The right eye was identified as the operative eye and it was prepped and draped in the usual sterile ophthalmic fashion.   A 1.0 millimeter clear-corneal paracentesis was made at the 10:30 position. 0.5 ml of preservative-free 1% lidocaine with epinephrine was injected into the anterior chamber.  The anterior chamber was filled with Healon 5 viscoelastic.  A 2.4 millimeter keratome was used to make a near-clear corneal incision at the 8:00 position.  A curvilinear capsulorrhexis was made with a cystotome and capsulorrhexis forceps.  Balanced salt solution was used to hydrodissect and hydrodelineate the nucleus.   Phacoemulsification was then used in stop and chop fashion to remove the lens nucleus and epinucleus.  The remaining cortex was then removed using the irrigation and aspiration handpiece. Healon was then placed into the capsular bag to  distend it for lens placement.  A lens was then injected into the capsular bag.  The remaining viscoelastic was aspirated.   Wounds were hydrated with balanced salt solution.  The anterior chamber was inflated to a physiologic pressure with balanced salt solution.   Intracameral vigamox 0.1 mL undiluted was injected into the eye and a drop placed onto the ocular surface.  No wound leaks were noted.  The patient was taken to the recovery room in stable condition without complications of anesthesia or surgery  Benay Pillow 08/14/2018, 12:20 PM

## 2018-08-14 NOTE — Anesthesia Preprocedure Evaluation (Addendum)
Anesthesia Evaluation  Patient identified by MRN, date of birth, ID band Patient awake    Reviewed: Allergy & Precautions, H&P , NPO status , Patient's Chart, lab work & pertinent test results  Airway Mallampati: II  TM Distance: >3 FB Neck ROM: full    Dental  (+) Edentulous Upper, Missing   Pulmonary former smoker,    Pulmonary exam normal breath sounds clear to auscultation       Cardiovascular hypertension, On Medications + CAD and + Past MI  Normal cardiovascular exam Rhythm:regular Rate:Normal     Neuro/Psych negative neurological ROS     GI/Hepatic negative GI ROS, Neg liver ROS,   Endo/Other  negative endocrine ROS  Renal/GU negative Renal ROS  negative genitourinary   Musculoskeletal   Abdominal   Peds  Hematology negative hematology ROS (+)   Anesthesia Other Findings   Reproductive/Obstetrics negative OB ROS                            Anesthesia Physical  Anesthesia Plan  ASA: [name]  Anesthesia Plan: MAC   Post-op Pain Management:    Induction:   PONV Risk Score and Plan: 1 and Midazolam  Airway Management Planned:   Additional Equipment:   Intra-op Plan:   Post-operative Plan:   Informed Consent: I have reviewed the patients History and Physical, chart, labs and discussed the procedure including the risks, benefits and alternatives for the proposed anesthesia with the patient or authorized representative who has indicated his/her understanding and acceptance.       Plan Discussed with: CRNA  Anesthesia Plan Comments:         Anesthesia Quick Evaluation

## 2018-08-14 NOTE — Transfer of Care (Signed)
Immediate Anesthesia Transfer of Care Note  Patient: Cory Hess  Procedure(s) Performed: CATARACT EXTRACTION PHACO AND INTRAOCULAR LENS PLACEMENT (IOC)  RIGHT (Right Eye)  Patient Location: PACU  Anesthesia Type: MAC  Level of Consciousness: awake, alert  and patient cooperative  Airway and Oxygen Therapy: Patient Spontanous Breathing and Patient connected to supplemental oxygen  Post-op Assessment: Post-op Vital signs reviewed, Patient's Cardiovascular Status Stable, Respiratory Function Stable, Patent Airway and No signs of Nausea or vomiting  Post-op Vital Signs: Reviewed and stable  Complications: No apparent anesthesia complications

## 2018-08-14 NOTE — H&P (Signed)

## 2018-08-15 ENCOUNTER — Encounter: Payer: Self-pay | Admitting: Ophthalmology

## 2018-09-25 ENCOUNTER — Telehealth: Payer: Self-pay | Admitting: Cardiovascular Disease

## 2018-09-25 ENCOUNTER — Other Ambulatory Visit: Payer: Self-pay

## 2018-09-25 MED ORDER — CLOPIDOGREL BISULFATE 75 MG PO TABS: 75.0000 mg | ORAL_TABLET | Freq: Every day | ORAL | 3 refills | Status: DC

## 2018-09-25 MED ORDER — CARVEDILOL 12.5 MG PO TABS: 12.5000 mg | ORAL_TABLET | Freq: Two times a day (BID) | ORAL | 3 refills | Status: DC

## 2018-09-25 MED ORDER — LOSARTAN POTASSIUM 50 MG PO TABS: 50.0000 mg | ORAL_TABLET | Freq: Every day | ORAL | 3 refills | Status: DC

## 2018-09-25 MED ORDER — ATORVASTATIN CALCIUM 80 MG PO TABS: 80.0000 mg | ORAL_TABLET | Freq: Every day | ORAL | 3 refills | Status: DC

## 2018-09-25 NOTE — Telephone Encounter (Signed)
°*  STAT* If patient is at the pharmacy, call can be transferred to refill team.   1. Which medications need to be refilled? (please list name of each medication and dose if known) Lipitor 80 mg daily, Cozaar 50 mg daily, Plavis 75 mg daily, Coreg 12.5 bid  2. Which pharmacy/location (including street and city if local pharmacy) is medication to be sent to? [facility] on [address]  3. Do they need a 30 day or 90 day supply? [place]

## 2018-11-05 ENCOUNTER — Emergency Department: Payer: Medicare Other

## 2018-11-05 ENCOUNTER — Other Ambulatory Visit: Payer: Self-pay

## 2018-11-05 ENCOUNTER — Inpatient Hospital Stay
Admission: EM | Admit: 2018-11-05 | Discharge: 2018-11-07 | DRG: 872 | Disposition: A | Discharge status: Home / Self Care | Payer: Medicare Other | Attending: Internal Medicine | Admitting: Internal Medicine

## 2018-11-05 DIAGNOSIS — Z7902 Long term (current) use of antithrombotics/antiplatelets: Secondary | ICD-10-CM

## 2018-11-05 DIAGNOSIS — Z79899 Other long term (current) drug therapy: Secondary | ICD-10-CM

## 2018-11-05 DIAGNOSIS — A419 Sepsis, unspecified organism: Secondary | ICD-10-CM

## 2018-11-05 DIAGNOSIS — R310 Gross hematuria: Secondary | ICD-10-CM | POA: Diagnosis not present

## 2018-11-05 DIAGNOSIS — B957 Other staphylococcus as the cause of diseases classified elsewhere: Secondary | ICD-10-CM | POA: Diagnosis present

## 2018-11-05 DIAGNOSIS — R000 Tachycardia, unspecified: Secondary | ICD-10-CM | POA: Diagnosis not present

## 2018-11-05 DIAGNOSIS — Z20828 Contact with and (suspected) exposure to other viral communicable diseases: Secondary | ICD-10-CM | POA: Diagnosis present

## 2018-11-05 DIAGNOSIS — R509 Fever, unspecified: Secondary | ICD-10-CM | POA: Diagnosis not present

## 2018-11-05 DIAGNOSIS — I5022 Chronic systolic (congestive) heart failure: Secondary | ICD-10-CM | POA: Diagnosis present

## 2018-11-05 DIAGNOSIS — Z951 Presence of aortocoronary bypass graft: Secondary | ICD-10-CM

## 2018-11-05 DIAGNOSIS — N200 Calculus of kidney: Secondary | ICD-10-CM | POA: Diagnosis not present

## 2018-11-05 DIAGNOSIS — Z823 Family history of stroke: Secondary | ICD-10-CM

## 2018-11-05 DIAGNOSIS — N281 Cyst of kidney, acquired: Secondary | ICD-10-CM | POA: Diagnosis present

## 2018-11-05 DIAGNOSIS — N3091 Cystitis, unspecified with hematuria: Secondary | ICD-10-CM | POA: Diagnosis present

## 2018-11-05 DIAGNOSIS — R319 Hematuria, unspecified: Secondary | ICD-10-CM | POA: Diagnosis not present

## 2018-11-05 DIAGNOSIS — I110 Hypertensive heart disease with heart failure: Secondary | ICD-10-CM | POA: Diagnosis present

## 2018-11-05 DIAGNOSIS — F17220 Nicotine dependence, chewing tobacco, uncomplicated: Secondary | ICD-10-CM | POA: Diagnosis present

## 2018-11-05 DIAGNOSIS — R1031 Right lower quadrant pain: Secondary | ICD-10-CM | POA: Diagnosis not present

## 2018-11-05 DIAGNOSIS — I252 Old myocardial infarction: Secondary | ICD-10-CM

## 2018-11-05 DIAGNOSIS — I2510 Atherosclerotic heart disease of native coronary artery without angina pectoris: Secondary | ICD-10-CM | POA: Diagnosis present

## 2018-11-05 DIAGNOSIS — A411 Sepsis due to other specified staphylococcus: Principal | ICD-10-CM | POA: Diagnosis present

## 2018-11-05 DIAGNOSIS — E785 Hyperlipidemia, unspecified: Secondary | ICD-10-CM | POA: Diagnosis present

## 2018-11-05 DIAGNOSIS — R109 Unspecified abdominal pain: Secondary | ICD-10-CM

## 2018-11-05 DIAGNOSIS — I452 Bifascicular block: Secondary | ICD-10-CM | POA: Diagnosis present

## 2018-11-05 DIAGNOSIS — Z8249 Family history of ischemic heart disease and other diseases of the circulatory system: Secondary | ICD-10-CM

## 2018-11-05 DIAGNOSIS — R1032 Left lower quadrant pain: Secondary | ICD-10-CM | POA: Diagnosis not present

## 2018-11-05 DIAGNOSIS — Z7982 Long term (current) use of aspirin: Secondary | ICD-10-CM

## 2018-11-05 LAB — BASIC METABOLIC PANEL
Anion gap: 9 (ref 5–15)
BUN: 13 mg/dL (ref 8–23)
CO2: 23 mmol/L (ref 22–32)
Calcium: 9.1 mg/dL (ref 8.9–10.3)
Chloride: 106 mmol/L (ref 98–111)
Creatinine, Ser: 1.21 mg/dL (ref 0.61–1.24)
GFR calc Af Amer: >60 mL/min (ref >60)
GFR calc non Af Amer: 59 mL/min — ABNORMAL LOW (ref >60)
Glucose, Bld: 105 mg/dL — ABNORMAL HIGH (ref 70–99)
Potassium: 4.1 mmol/L (ref 3.5–5.1)
Sodium: 138 mmol/L (ref 135–145)

## 2018-11-05 LAB — URINALYSIS, COMPLETE (UACMP) WITH MICROSCOPIC
Bacteria, UA: NONE SEEN
RBC / HPF: >50 RBC/hpf — ABNORMAL HIGH (ref 0–5)
Specific Gravity, Urine: 1.026 (ref 1.005–1.030)
Squamous Epithelial / HPF: NONE SEEN (ref 0–5)

## 2018-11-05 LAB — CBC
HCT: 44.4 % (ref 39.0–52.0)
Hemoglobin: 15.1 g/dL (ref 13.0–17.0)
MCH: 30.2 pg (ref 26.0–34.0)
MCHC: 34 g/dL (ref 30.0–36.0)
MCV: 88.8 fL (ref 80.0–100.0)
Platelets: 182 10*3/uL (ref 150–400)
RBC: 5 MIL/uL (ref 4.22–5.81)
RDW: 12.6 % (ref 11.5–15.5)
WBC: 13.7 10*3/uL — ABNORMAL HIGH (ref 4.0–10.5)
nRBC: 0 % (ref 0.0–0.2)

## 2018-11-05 NOTE — ED Triage Notes (Signed)
Patient reports symptoms began around 3 pm.  Report urinary urgency and urinating blood.

## 2018-11-06 ENCOUNTER — Emergency Department: Payer: Medicare Other

## 2018-11-06 ENCOUNTER — Encounter: Payer: Self-pay | Admitting: Radiology

## 2018-11-06 DIAGNOSIS — E785 Hyperlipidemia, unspecified: Secondary | ICD-10-CM | POA: Diagnosis present

## 2018-11-06 DIAGNOSIS — N3091 Cystitis, unspecified with hematuria: Secondary | ICD-10-CM | POA: Diagnosis present

## 2018-11-06 DIAGNOSIS — Z79899 Other long term (current) drug therapy: Secondary | ICD-10-CM | POA: Diagnosis not present

## 2018-11-06 DIAGNOSIS — N390 Urinary tract infection, site not specified: Secondary | ICD-10-CM | POA: Diagnosis not present

## 2018-11-06 DIAGNOSIS — Z823 Family history of stroke: Secondary | ICD-10-CM | POA: Diagnosis not present

## 2018-11-06 DIAGNOSIS — R319 Hematuria, unspecified: Secondary | ICD-10-CM | POA: Diagnosis present

## 2018-11-06 DIAGNOSIS — R350 Frequency of micturition: Secondary | ICD-10-CM | POA: Diagnosis not present

## 2018-11-06 DIAGNOSIS — I252 Old myocardial infarction: Secondary | ICD-10-CM | POA: Diagnosis not present

## 2018-11-06 DIAGNOSIS — I2510 Atherosclerotic heart disease of native coronary artery without angina pectoris: Secondary | ICD-10-CM | POA: Diagnosis present

## 2018-11-06 DIAGNOSIS — N281 Cyst of kidney, acquired: Secondary | ICD-10-CM | POA: Diagnosis present

## 2018-11-06 DIAGNOSIS — Z951 Presence of aortocoronary bypass graft: Secondary | ICD-10-CM | POA: Diagnosis not present

## 2018-11-06 DIAGNOSIS — B957 Other staphylococcus as the cause of diseases classified elsewhere: Secondary | ICD-10-CM | POA: Diagnosis present

## 2018-11-06 DIAGNOSIS — Z20828 Contact with and (suspected) exposure to other viral communicable diseases: Secondary | ICD-10-CM | POA: Diagnosis present

## 2018-11-06 DIAGNOSIS — Z7982 Long term (current) use of aspirin: Secondary | ICD-10-CM | POA: Diagnosis not present

## 2018-11-06 DIAGNOSIS — I110 Hypertensive heart disease with heart failure: Secondary | ICD-10-CM | POA: Diagnosis present

## 2018-11-06 DIAGNOSIS — Z8249 Family history of ischemic heart disease and other diseases of the circulatory system: Secondary | ICD-10-CM | POA: Diagnosis not present

## 2018-11-06 DIAGNOSIS — R3915 Urgency of urination: Secondary | ICD-10-CM | POA: Diagnosis not present

## 2018-11-06 DIAGNOSIS — R310 Gross hematuria: Secondary | ICD-10-CM | POA: Diagnosis not present

## 2018-11-06 DIAGNOSIS — F17220 Nicotine dependence, chewing tobacco, uncomplicated: Secondary | ICD-10-CM | POA: Diagnosis present

## 2018-11-06 DIAGNOSIS — N3001 Acute cystitis with hematuria: Secondary | ICD-10-CM | POA: Diagnosis not present

## 2018-11-06 DIAGNOSIS — N200 Calculus of kidney: Secondary | ICD-10-CM | POA: Diagnosis present

## 2018-11-06 DIAGNOSIS — I5022 Chronic systolic (congestive) heart failure: Secondary | ICD-10-CM | POA: Diagnosis present

## 2018-11-06 DIAGNOSIS — Z7902 Long term (current) use of antithrombotics/antiplatelets: Secondary | ICD-10-CM | POA: Diagnosis not present

## 2018-11-06 DIAGNOSIS — I452 Bifascicular block: Secondary | ICD-10-CM | POA: Diagnosis present

## 2018-11-06 DIAGNOSIS — A411 Sepsis due to other specified staphylococcus: Secondary | ICD-10-CM | POA: Diagnosis present

## 2018-11-06 DIAGNOSIS — A419 Sepsis, unspecified organism: Secondary | ICD-10-CM | POA: Diagnosis not present

## 2018-11-06 LAB — BASIC METABOLIC PANEL
Anion gap: 7 (ref 5–15)
BUN: 13 mg/dL (ref 8–23)
CO2: 25 mmol/L (ref 22–32)
Calcium: 8.5 mg/dL — ABNORMAL LOW (ref 8.9–10.3)
Chloride: 108 mmol/L (ref 98–111)
Creatinine, Ser: 1.08 mg/dL (ref 0.61–1.24)
GFR calc Af Amer: >60 mL/min (ref >60)
GFR calc non Af Amer: >60 mL/min (ref >60)
Glucose, Bld: 97 mg/dL (ref 70–99)
Potassium: 4.2 mmol/L (ref 3.5–5.1)
Sodium: 140 mmol/L (ref 135–145)

## 2018-11-06 LAB — HEMOGLOBIN AND HEMATOCRIT, BLOOD
HCT: 41 % (ref 39.0–52.0)
HCT: 39.6 % (ref 39.0–52.0)
HCT: 39.4 % (ref 39.0–52.0)
Hemoglobin: 13.7 g/dL (ref 13.0–17.0)
Hemoglobin: 13.5 g/dL (ref 13.0–17.0)
Hemoglobin: 13.2 g/dL (ref 13.0–17.0)

## 2018-11-06 LAB — LACTIC ACID, PLASMA
Lactic Acid, Venous: 1 mmol/L (ref 0.5–1.9)
Lactic Acid, Venous: 1 mmol/L (ref 0.5–1.9)

## 2018-11-06 LAB — CBC
HCT: 41.1 % (ref 39.0–52.0)
Hemoglobin: 13.8 g/dL (ref 13.0–17.0)
MCH: 30.3 pg (ref 26.0–34.0)
MCHC: 33.6 g/dL (ref 30.0–36.0)
MCV: 90.1 fL (ref 80.0–100.0)
Platelets: 157 10*3/uL (ref 150–400)
RBC: 4.56 MIL/uL (ref 4.22–5.81)
RDW: 12.6 % (ref 11.5–15.5)
WBC: 11.4 10*3/uL — ABNORMAL HIGH (ref 4.0–10.5)
nRBC: 0 % (ref 0.0–0.2)

## 2018-11-06 LAB — SARS CORONAVIRUS 2 BY RT PCR (HOSPITAL ORDER, PERFORMED IN ~~LOC~~ HOSPITAL LAB): SARS Coronavirus 2: NEGATIVE

## 2018-11-06 LAB — PROCALCITONIN: Procalcitonin: <0.1 ng/mL

## 2018-11-06 MED ORDER — ASPIRIN EC 81 MG PO TBEC
81.0000 mg | DELAYED_RELEASE_TABLET | Freq: Every day | ORAL | Status: DC
  Administered 2018-11-06 – 2018-11-07 (×2): 81 mg via ORAL
  Filled 2018-11-06 (×2): qty 1

## 2018-11-06 MED ORDER — SODIUM CHLORIDE 0.9 % IV SOLN
1.0000 g | INTRAVENOUS | Status: DC
  Administered 2018-11-07: 05:00:00 1 g via INTRAVENOUS
  Filled 2018-11-06: qty 1

## 2018-11-06 MED ORDER — ATORVASTATIN CALCIUM 20 MG PO TABS
80.0000 mg | ORAL_TABLET | Freq: Every day | ORAL | Status: DC
  Administered 2018-11-06 – 2018-11-07 (×2): 80 mg via ORAL
  Filled 2018-11-06 (×2): qty 4

## 2018-11-06 MED ORDER — OXYCODONE HCL 5 MG PO TABS
5.0000 mg | ORAL_TABLET | ORAL | Status: DC | PRN
  Administered 2018-11-06 – 2018-11-07 (×2): 5 mg via ORAL
  Filled 2018-11-06 (×2): qty 1

## 2018-11-06 MED ORDER — MAGNESIUM HYDROXIDE 400 MG/5ML PO SUSP
30.0000 mL | Freq: Every day | ORAL | Status: DC | PRN
  Filled 2018-11-06: qty 30

## 2018-11-06 MED ORDER — ADULT MULTIVITAMIN W/MINERALS CH
1.0000 | ORAL_TABLET | Freq: Every day | ORAL | Status: DC
  Administered 2018-11-06 – 2018-11-07 (×2): 1 via ORAL
  Filled 2018-11-06 (×2): qty 1

## 2018-11-06 MED ORDER — ONDANSETRON HCL 4 MG PO TABS: 4.0000 mg | ORAL_TABLET | Freq: Four times a day (QID) | ORAL | Status: DC | PRN

## 2018-11-06 MED ORDER — CARVEDILOL 3.125 MG PO TABS
12.5000 mg | ORAL_TABLET | Freq: Two times a day (BID) | ORAL | Status: DC
  Administered 2018-11-06 – 2018-11-07 (×3): 12.5 mg via ORAL
  Filled 2018-11-06 (×3): qty 4

## 2018-11-06 MED ORDER — LOSARTAN POTASSIUM 50 MG PO TABS
50.0000 mg | ORAL_TABLET | Freq: Every day | ORAL | Status: DC
  Administered 2018-11-06 – 2018-11-07 (×2): 50 mg via ORAL
  Filled 2018-11-06 (×2): qty 1

## 2018-11-06 MED ORDER — SODIUM CHLORIDE 0.9 % IV SOLN
1.0000 g | INTRAVENOUS | Status: DC
  Administered 2018-11-06: 1 g via INTRAVENOUS
  Filled 2018-11-06: qty 10

## 2018-11-06 MED ORDER — ONDANSETRON HCL 4 MG/2ML IJ SOLN: 4.0000 mg | Freq: Four times a day (QID) | INTRAMUSCULAR | Status: DC | PRN

## 2018-11-06 MED ORDER — SODIUM CHLORIDE 0.9 % IV BOLUS (SEPSIS)
1000.0000 mL | Freq: Once | INTRAVENOUS | Status: AC
  Administered 2018-11-06: 02:00:00 1000 mL via INTRAVENOUS

## 2018-11-06 MED ORDER — SODIUM CHLORIDE 0.9 % IV SOLN
INTRAVENOUS | Status: DC
  Administered 2018-11-06 (×3): via INTRAVENOUS

## 2018-11-06 MED ORDER — ACETAMINOPHEN 325 MG PO TABS
650.0000 mg | ORAL_TABLET | Freq: Four times a day (QID) | ORAL | Status: DC | PRN
  Administered 2018-11-06: 650 mg via ORAL
  Filled 2018-11-06: qty 2

## 2018-11-06 MED ORDER — IOHEXOL 300 MG/ML  SOLN
100.0000 mL | Freq: Once | INTRAMUSCULAR | Status: AC | PRN
  Administered 2018-11-06: 01:00:00 100 mL via INTRAVENOUS

## 2018-11-06 MED ORDER — TRAZODONE HCL 50 MG PO TABS: 25.0000 mg | ORAL_TABLET | Freq: Every evening | ORAL | Status: DC | PRN

## 2018-11-06 MED ORDER — ACETAMINOPHEN 650 MG RE SUPP: 650.0000 mg | Freq: Four times a day (QID) | RECTAL | Status: DC | PRN

## 2018-11-06 NOTE — ED Provider Notes (Signed)
[facility] Emergency Department Provider Note  ____________________________________________   First MD Initiated Contact with Patient 11/05/18 2314     (approximate)  I have reviewed the triage vital signs and the nursing notes.   HISTORY  Chief Complaint Hematuria    HPI Cory Hess is a 73 y.o. male with medical history as listed below which notably includes generally being healthy until last year when he had coronary artery disease diagnosed and a stent placed after having an MI.  He has been try to take care of himself since then.  He has no history of kidney stones or urinary tract infections.  He presents tonight because yesterday started having some pain and bilateral flanks or lower back.  He just assumed he pulled something and did not think much about it but then this afternoon he felt a sense of urinary urgency and incomplete emptying.  First he had some rust colored urine and then he began to urinate what appeared to be pure blood.  This is happened multiple times.  He has some associated dysuria.  He felt little bit hot and had a fever measured at home as well as a temperature of 100.5 measured in triage.  He denies any other symptoms.  He has not had any contact with COVID-19 patients.  He denies sore throat, chest pain, cough, shortness of breath, nausea, vomiting, and abdominal pain.  He has mild to moderate aching pain in bilateral flanks and lower back and dysuria, and those are the only symptoms.  He has never had gross blood per urine before.  He is not on any blood thinners except for aspirin and Plavix.         Past Medical History:  Diagnosis Date   Arthritis    neck   CAD (coronary artery disease)    a. 08/2017 STEMI/Cath: LM 80ost/d, LAD 100p, LCX 95 ost/p, RCA 60p, 67m, RPDA 80ost; b. 08/2017 CABG x 3: LIMA->LAD, VG->OM, VG->PDA.   Essential hypertension    HFrEF (heart failure with reduced ejection fraction) ([place])    a.  08/2017 TEE: EF 30-35%, sept/ant/inf HK, apical AK. 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   Hyperlipidemia LDL goal <70    Ischemic cardiomyopathy    a. 08/2017 TEE: EF 30-35%.   Myocardial infarction ([place]) 07/2017   Shingles    2018 - right side   Vertigo    several months ago    Patient Active Problem List   Diagnosis Date Noted   Hematuria 11/06/2018   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   Coronary artery disease 08/28/2017   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 [facility])     Past Surgical History:  Procedure Laterality Date   CATARACT EXTRACTION W/PHACO Left 07/18/2018   Procedure: CATARACT EXTRACTION PHACO AND INTRAOCULAR LENS PLACEMENT ([place])  LEFT;  Surgeon: Eulogio Bear, MD;  Location: [place];  Service: Ophthalmology;  Laterality: Left;   CATARACT EXTRACTION W/PHACO Right 08/14/2018   Procedure: CATARACT EXTRACTION PHACO AND INTRAOCULAR LENS PLACEMENT (IOC)  RIGHT;  Surgeon: Eulogio Bear, MD;  Location: [place];  Service: Ophthalmology;  Laterality: Right;   CORONARY ARTERY BYPASS GRAFT N/A 08/27/2017   Procedure: CORONARY ARTERY BYPASS GRAFTING (CABG) ON PUMP USING LEFT INTERNAL MAMMARY ARTERY AND LEFT GREATER SAPHENOUS VEIN VIA ENDOVEIN HARVEST;  Surgeon: Gaye Pollack, MD;  Location: [place];  Service: Open Heart Surgery;  Laterality: N/A;   CORONARY/GRAFT ACUTE MI REVASCULARIZATION N/A 08/27/2017  Procedure: Coronary/Graft Acute MI Revascularization;  Surgeon: Wellington Hampshire, MD;  Location: [place] CV LAB;  Service: Cardiovascular;  Laterality: N/A;   HERNIA REPAIR     LEFT HEART CATH AND CORONARY ANGIOGRAPHY N/A 08/27/2017   Procedure: LEFT HEART CATH AND CORONARY ANGIOGRAPHY;  Surgeon: Wellington Hampshire, MD;  Location: [place] CV LAB;  Service: Cardiovascular;  Laterality: N/A;    Prior to Admission medications   Medication Sig Start Date End Date Taking? Authorizing Provider  acetaminophen (TYLENOL) 650 MG  CR tablet Take 650 mg by mouth every 8 (eight) hours as needed for pain.   Yes [provider]  aspirin EC 81 MG EC tablet Take 1 tablet (81 mg total) by mouth daily. Patient taking differently: Take 81 mg by mouth daily. lunch 09/03/17  Yes Gold, Wayne E, PA-C  atorvastatin (LIPITOR) 80 MG tablet Take 1 tablet (80 mg total) by mouth daily. 09/25/18  Yes Theora Gianotti, NP  carvedilol (COREG) 12.5 MG tablet Take 1 tablet (12.5 mg total) by mouth 2 (two) times daily with a meal. 09/25/18  Yes Theora Gianotti, NP  clopidogrel (PLAVIX) 75 MG tablet Take 1 tablet (75 mg total) by mouth daily. 09/25/18  Yes Theora Gianotti, NP  losartan (COZAAR) 50 MG tablet Take 1 tablet (50 mg total) by mouth daily. 09/25/18  Yes Theora Gianotti, NP  Multiple Vitamin (MULTIVITAMIN WITH MINERALS) TABS tablet Take 1 tablet by mouth daily. One a Day over 50/ lunch   Yes [provider]    Allergies Patient has no known allergies.  Family History  Problem Relation Age of Onset   Heart failure Mother    Lung disease Father    Stroke Father     Social History Social History   Tobacco Use   Smoking status: Former Smoker    Types: Cigarettes    Quit date: 2000    Years since quitting: 20.7   Smokeless tobacco: Current User    Types: Snuff  Substance Use Topics   Alcohol use: Not Currently   Drug use: Not Currently    Review of Systems Constitutional: +fever Eyes: No visual changes. ENT: No sore throat. Cardiovascular: Denies chest pain. Respiratory: Denies shortness of breath. Gastrointestinal: No abdominal pain.  No nausea, no vomiting.  No diarrhea.  No constipation. Genitourinary: Gross hematuria with dysuria. Musculoskeletal: Bilateral lower back/flank pain. Integumentary: Negative for rash. Neurological: Negative for headaches, focal weakness or numbness.   ____________________________________________   PHYSICAL EXAM:  VITAL  SIGNS: ED Triage Vitals  Enc Vitals Group     BP 11/05/18 2235 (!) 158/104     Pulse Rate 11/05/18 2235 (!) 108     Resp 11/05/18 2235 20     Temp 11/05/18 2235 (!) 100.5 F (38.1 C)     Temp Source 11/05/18 2235 Oral     SpO2 11/05/18 2235 95 %     Weight 11/05/18 2233 106.6 kg (235 lb)     Height 11/05/18 2233 1.829 m (6')     Head Circumference --      Peak Flow --      Pain Score 11/05/18 2233 0     Pain Loc --      Pain Edu? --      Excl. in [place]? --     Constitutional: Alert and oriented.  Does not appear to be in pain although he does appear a little bit uncomfortable and says he feels better if he is  standing up or walking around that if he holds still. Eyes: Conjunctivae are normal.  Head: Atraumatic. Nose: No congestion/rhinnorhea. Mouth/Throat: Mucous membranes are moist. Neck: No stridor.  No meningeal signs.   Cardiovascular: Normal rate, regular rhythm. Good peripheral circulation. Grossly normal heart sounds. Respiratory: Normal respiratory effort.  No retractions. Gastrointestinal: Soft and nontender. No distention.  Musculoskeletal: No appreciable tenderness to percussion of either flank.  No lower extremity tenderness nor edema. No gross deformities of extremities. Neurologic:  Normal speech and language. No gross focal neurologic deficits are appreciated.  Skin:  Skin is warm, dry and intact. Psychiatric: Mood and affect are normal. Speech and behavior are normal.  ____________________________________________   LABS (all labs ordered are listed, but only abnormal results are displayed)  Labs Reviewed  URINALYSIS, COMPLETE (UACMP) WITH MICROSCOPIC - Abnormal; Notable for the following components:      Result Value   Color, Urine RED (*)    APPearance CLOUDY (*)    Glucose, UA   (*)    Value: TEST NOT REPORTED DUE TO COLOR INTERFERENCE OF URINE PIGMENT   Hgb urine dipstick   (*)    Value: TEST NOT REPORTED DUE TO COLOR INTERFERENCE OF URINE PIGMENT    Bilirubin Urine   (*)    Value: TEST NOT REPORTED DUE TO COLOR INTERFERENCE OF URINE PIGMENT   Ketones, ur   (*)    Value: TEST NOT REPORTED DUE TO COLOR INTERFERENCE OF URINE PIGMENT   Protein, ur   (*)    Value: TEST NOT REPORTED DUE TO COLOR INTERFERENCE OF URINE PIGMENT   Nitrite   (*)    Value: TEST NOT REPORTED DUE TO COLOR INTERFERENCE OF URINE PIGMENT   Leukocytes,Ua   (*)    Value: TEST NOT REPORTED DUE TO COLOR INTERFERENCE OF URINE PIGMENT   RBC / HPF >50 (*)    All other components within normal limits  BASIC METABOLIC PANEL - Abnormal; Notable for the following components:   Glucose, Bld 105 (*)    GFR calc non Af Amer 59 (*)    All other components within normal limits  CBC - Abnormal; Notable for the following components:   WBC 13.7 (*)    All other components within normal limits  CBC - Abnormal; Notable for the following components:   WBC 11.4 (*)    All other components within normal limits  SARS CORONAVIRUS 2 (HOSPITAL ORDER, [place] [facility])  URINE CULTURE  CULTURE, BLOOD (ROUTINE X 2)  CULTURE, BLOOD (ROUTINE X 2)  LACTIC ACID, PLASMA  PROCALCITONIN  LACTIC ACID, PLASMA  BASIC METABOLIC PANEL  HEMOGLOBIN AND HEMATOCRIT, BLOOD  HEMOGLOBIN AND HEMATOCRIT, BLOOD  HEMOGLOBIN AND HEMATOCRIT, BLOOD   ____________________________________________  EKG  ED ECG REPORT I, Hinda Kehr, the attending physician, personally viewed and interpreted this ECG.  Date: 11/06/2018 EKG Time: 00: 14 Rate: 96 Rhythm: Borderline sinus tachycardia QRS Axis: normal Intervals: Right bundle branch block and left anterior fascicular block ST/T Wave abnormalities: Non-specific ST segment / T-wave changes, but no clear evidence of acute ischemia. Narrative Interpretation: no definitive evidence of acute ischemia; does not meet STEMI criteria.   ____________________________________________  RADIOLOGY I, Hinda Kehr, personally viewed and evaluated  these images (plain radiographs) as part of my medical decision making, as well as reviewing the written report by the radiologist.  ED MD interpretation: No active disease on chest x-ray.  Questionable cystitis, no clear explanation for hematuria, no ureteral stones (CT abd/pelvis).  Official radiology report(s): Ct Abdomen Pelvis W Contrast  Result Date: 11/06/2018 CLINICAL DATA:  Urinary urgency, hematuria EXAM: CT ABDOMEN AND PELVIS WITH CONTRAST TECHNIQUE: Multidetector CT imaging of the abdomen and pelvis was performed using the standard protocol following bolus administration of intravenous contrast. CONTRAST:  136mL OMNIPAQUE IOHEXOL 300 MG/ML  SOLN COMPARISON:  Same day chest radiograph FINDINGS: Lower chest: Lung bases are clear. Normal heart size. No pericardial effusion. Atherosclerotic calcification of the coronary arteries. Hepatobiliary: Subcentimeter hypoattenuating lesion in the left lobe liver (2/17) too small to fully characterize on CT imaging but statistically likely benign. No concerning liver lesions. No gallstones, gallbladder wall thickening, or biliary dilatation. Pancreas: Unremarkable. No pancreatic ductal dilatation or surrounding inflammatory changes. Spleen: Normal in size without focal abnormality. Adrenals/Urinary Tract: Normal adrenal glands. Several fluid attenuation cysts are seen in both kidneys, probable benign simple cysts largest on the right measures 4.9 cm largest on the left measures 2.2 cm. Additional subcentimeter hypoattenuating foci are too small to fully characterize on CT imaging but statistically likely benign. Nonobstructing calculus is present in the lower pole left kidney (5/66) no obstructive urolithiasis or hydronephrosis. Mild bilateral symmetric perinephric stranding, a nonspecific finding. Urinary bladder is largely decompressed but demonstrates perivesicular haze in some luminal hyperattenuation. Stomach/Bowel: Distal esophagus, stomach and duodenal  sweep are unremarkable. No bowel wall thickening or dilatation. No evidence of obstruction. A normal appendix is visualized. Vascular/Lymphatic: Atherosclerotic plaque within the normal caliber aorta. No suspicious or enlarged lymph nodes in the included lymphatic chains. Reproductive: The prostate and seminal vesicles are unremarkable. Other: Postsurgical changes from prior right inguinal hernia repair. Fat containing left inguinal hernia. No bowel containing hernia. No free abdominopelvic fluid or gas. No organized collection or abscess. Musculoskeletal: Multilevel degenerative changes are present in the imaged portions of the spine. 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 IMPRESSION: 1. Circumferential mural thickening and perivesicular hazy stranding suggestive of cystitis however the bladder is suboptimally evaluated at this time given underdistention. Correlate with patient's symptoms and consider urinalysis if not previously obtained. 2. Nonobstructing left nephrolithiasis. No obstructive urolithiasis or hydronephrosis. 3. Aortic Atherosclerosis (ICD10-I70.0). 4. Prior right inguinal hernia repair. Fat containing left inguinal hernia. Electronically Signed   By: Lovena Le M.D.   On: 11/06/2018 01:33   Dg Chest Portable 1 View  Result Date: 11/05/2018 CLINICAL DATA:  The fever, preop EXAM: PORTABLE CHEST 1 VIEW COMPARISON:  04/07/2018 FINDINGS: Post sternotomy changes. No acute consolidation or effusion. Coarse chronic appearing interstitial opacity. Normal heart size. No pneumothorax. IMPRESSION: No active disease. Electronically Signed   By: Donavan Foil M.D.   On: 11/05/2018 23:53    ____________________________________________   PROCEDURES   Procedure(s) performed (including Critical Care):  Procedures   ____________________________________________   INITIAL IMPRESSION / MDM / [place] / ED COURSE  As part of my medical decision making, I reviewed  the following data within the [place] notes reviewed and incorporated, Labs reviewed , Old chart reviewed and Notes from prior ED visits   Differential diagnosis includes, but is not limited to, renal/ureteral stone, UTI/pyelonephritis, bladder cancer, sepsis.  The patient is mildly tachycardic and mildly febrile and has no other symptoms except for the bilateral flank pain and hematuria.  He has a leukocytosis of 13.7.  He is otherwise hemodynamically stable with a normal blood pressure and he is well-appearing in spite of his symptoms.  Basic metabolic panel is within normal limits including his creatinine.  Urinalysis is  demonstrating frank blood but without any obvious sign of infection but it is difficult to interpret given the amount of blood.  I personally witnessed the toilet after he urinated and it does appear to be frank hematuria, not simply some blood in the urine.  I considered obtaining a CT hematuria protocol for more complete evaluation of the possibilities of his symptoms, but after speaking with Sudie Bailey the CT technologist, it sounds as if this is more currently done as an outpatient scan ordered by urology.  I will proceed with a CT scan of the abdomen and pelvis with IV contrast which should provide the necessary information in the emergency department setting.  I have added on a lactic acid and pro calcitonin and a coronavirus swab in case he requires emergent urological intervention.      Clinical Course as of Nov 06 422  Mon Nov 06, 2018  0008 No active disease on chest x-ray  DG Chest Portable 1 View [CF]  0018 Also ordering 1 L of normal saline for the tachycardia and apparent sepsis, although I strongly doubt severe sepsis   [CF]  0049 Procalcitonin: <0.10 [CF]  0103 Lactic Acid, Venous: 1.0 [CF]  0136 No clear indication of the cause of the patient's symptoms on CT scan.  CT ABDOMEN PELVIS W CONTRAST [CF]  0136 SARS Coronavirus 2: NEGATIVE  [CF]  0136 Given the patient's pain, gross hematuria, and meeting sepsis criteria, I am going to treat him empirically with antibiotics for community-acquired urinary tract infection and will discuss the admission with the hospitalist.  No indication for emergent call to urology at this time.   [CF]  0142 Dr. Sidney Ace acknowledged the admission   [CF]    Clinical Course User Index [CF] Hinda Kehr, MD     ____________________________________________  FINAL CLINICAL IMPRESSION(S) / ED DIAGNOSES  Final diagnoses:  Fever  Bilateral flank pain  Hematuria  Sepsis, due to unspecified organism, unspecified whether acute organ dysfunction present [facility])     MEDICATIONS GIVEN DURING THIS VISIT:  Medications  atorvastatin (LIPITOR) tablet 80 mg (has no administration in time range)  carvedilol (COREG) tablet 12.5 mg (has no administration in time range)  losartan (COZAAR) tablet 50 mg (has no administration in time range)  multivitamin with minerals tablet 1 tablet (has no administration in time range)  0.9 %  sodium chloride infusion ( Intravenous New Bag/Given 11/06/18 0229)  acetaminophen (TYLENOL) tablet 650 mg (has no administration in time range)    Or  acetaminophen (TYLENOL) suppository 650 mg (has no administration in time range)  traZODone (DESYREL) tablet 25 mg (has no administration in time range)  oxyCODONE (Oxy IR/ROXICODONE) immediate release tablet 5 mg (has no administration in time range)  magnesium hydroxide (MILK OF MAGNESIA) suspension 30 mL (has no administration in time range)  ondansetron (ZOFRAN) tablet 4 mg (has no administration in time range)    Or  ondansetron (ZOFRAN) injection 4 mg (has no administration in time range)  cefTRIAXone (ROCEPHIN) 1 g in sodium chloride 0.9 % 100 mL IVPB (has no administration in time range)  sodium chloride 0.9 % bolus 1,000 mL (0 mLs Intravenous Stopped 11/06/18 0313)  iohexol (OMNIPAQUE) 300 MG/ML solution 100 mL (100 mLs  Intravenous Contrast Given 11/06/18 0105)     ED Discharge Orders    None      *Please note:  Jabori Wiess was evaluated in Emergency Department on 11/06/2018 for the symptoms described in the history of present illness. He was evaluated  in the context of the global COVID-19 pandemic, which necessitated consideration that the patient might be at risk for infection with the SARS-CoV-2 virus that causes COVID-19. Institutional protocols and algorithms that pertain to the evaluation of patients at risk for COVID-19 are in a state of rapid change based on information released by regulatory bodies including the CDC and federal and state organizations. These policies and algorithms were followed during the patient's care in the ED.  Some ED evaluations and interventions may be delayed as a result of limited staffing during the pandemic.*  Note:  This document was prepared using Dragon voice recognition software and may include unintentional dictation errors.   Hinda Kehr, MD 11/06/18 [phone]

## 2018-11-06 NOTE — ED Notes (Signed)
ED TO INPATIENT HANDOFF REPORT  ED Nurse Name and Phone #:  Arrie Aran [phone]  S Name/Age/Gender Cory Hess 73 y.o. male Room/Bed: ED19A/ED19A  Code Status   Code Status: Full Code  Home/SNF/Other Home Patient oriented to: self Is this baseline? alert and oriented x4  Triage Complete: Triage complete  Chief Complaint Hematuria  Triage Note Patient reports symptoms began around 3 pm.  Report urinary urgency and urinating blood.   Allergies No Known Allergies  Level of Care/Admitting Diagnosis ED Disposition    ED Disposition Condition [facility] Area: [place] [[id number]]  Level of Care: Med-Surg [16]  Covid Evaluation: Confirmed COVID Negative  Diagnosis: Hematuria [id number]  Admitting Physician: Christel Mormon [id number]  Attending Physician: Christel Mormon [id number]  Estimated length of stay: past midnight tomorrow  Certification:: I certify this patient will need inpatient services for at least 2 midnights  PT Class (Do Not Modify): Inpatient [101]  PT Acc Code (Do Not Modify): Private [1]       B Medical/Surgery History Past Medical History:  Diagnosis Date  . Arthritis    neck  . CAD (coronary artery disease)    a. 08/2017 STEMI/Cath: LM 80ost/d, LAD 100p, LCX 95 ost/p, RCA 60p, 73m, RPDA 80ost; b. 08/2017 CABG x 3: LIMA->LAD, VG->OM, VG->PDA.  Marland Kitchen Essential hypertension   . HFrEF (heart failure with reduced ejection fraction) ([place])    a. 08/2017 TEE: EF 30-35%, sept/ant/inf HK, apical AK. 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  . Hyperlipidemia LDL goal <70   . Ischemic cardiomyopathy    a. 08/2017 TEE: EF 30-35%.  . Myocardial infarction ([place]) 07/2017  . Shingles    2018 - right side  . Vertigo    several months ago   Past Surgical History:  Procedure Laterality Date  . CATARACT EXTRACTION W/PHACO Left 07/18/2018   Procedure: CATARACT EXTRACTION PHACO AND INTRAOCULAR LENS PLACEMENT ([place])  LEFT;  Surgeon: Eulogio Bear, MD;   Location: [place];  Service: Ophthalmology;  Laterality: Left;  . CATARACT EXTRACTION W/PHACO Right 08/14/2018   Procedure: CATARACT EXTRACTION PHACO AND INTRAOCULAR LENS PLACEMENT ([place])  RIGHT;  Surgeon: Eulogio Bear, MD;  Location: [place];  Service: Ophthalmology;  Laterality: Right;  . CORONARY ARTERY BYPASS GRAFT N/A 08/27/2017   Procedure: CORONARY ARTERY BYPASS GRAFTING (CABG) ON PUMP USING LEFT INTERNAL MAMMARY ARTERY AND LEFT GREATER SAPHENOUS VEIN VIA ENDOVEIN HARVEST;  Surgeon: Gaye Pollack, MD;  Location: [place];  Service: Open Heart Surgery;  Laterality: N/A;  . CORONARY/GRAFT ACUTE MI REVASCULARIZATION N/A 08/27/2017   Procedure: Coronary/Graft Acute MI Revascularization;  Surgeon: Wellington Hampshire, MD;  Location: [place] [facility];  Service: Cardiovascular;  Laterality: N/A;  . HERNIA REPAIR    . LEFT HEART CATH AND CORONARY ANGIOGRAPHY N/A 08/27/2017   Procedure: LEFT HEART CATH AND CORONARY ANGIOGRAPHY;  Surgeon: Wellington Hampshire, MD;  Location: [place] [facility];  Service: Cardiovascular;  Laterality: N/A;     A IV Location/Drains/Wounds Patient Lines/Drains/Airways Status   Active Line/Drains/Airways    Name:   Placement date:   Placement time:   Site:   Days:   Peripheral IV 11/06/18 Left Antecubital   11/06/18    0038    Antecubital   less than 1   Incision (Closed) 08/27/17 Chest Other (Comment)   08/27/17    2314     436   Incision (Closed) 08/27/17 Leg Left   08/27/17  2314     436   Incision (Closed) 07/18/18 Eye   07/18/18    1122     111   Incision (Closed) 08/14/18 Eye   08/14/18    1221     84          Intake/Output Last 24 hours No intake or output data in the 24 hours ending 11/06/18 0252  Labs/Imaging Results for orders placed or performed during the hospital encounter of 11/05/18 (from the past 48 hour(s))  Urinalysis, Complete w Microscopic     Status: Abnormal   Collection Time: 11/05/18 10:38 PM  Result Value Ref  Range   Color, Urine RED (A) YELLOW   APPearance CLOUDY (A) CLEAR   Specific Gravity, Urine 1.026 1.005 - 1.030   pH  5.0 - 8.0    TEST NOT REPORTED DUE TO COLOR INTERFERENCE OF URINE PIGMENT   Glucose, UA (A) NEGATIVE mg/dL    TEST NOT REPORTED DUE TO COLOR INTERFERENCE OF URINE PIGMENT   Hgb urine dipstick (A) NEGATIVE    TEST NOT REPORTED DUE TO COLOR INTERFERENCE OF URINE PIGMENT   Bilirubin Urine (A) NEGATIVE    TEST NOT REPORTED DUE TO COLOR INTERFERENCE OF URINE PIGMENT   Ketones, ur (A) NEGATIVE mg/dL    TEST NOT REPORTED DUE TO COLOR INTERFERENCE OF URINE PIGMENT   Protein, ur (A) NEGATIVE mg/dL    TEST NOT REPORTED DUE TO COLOR INTERFERENCE OF URINE PIGMENT   Nitrite (A) NEGATIVE    TEST NOT REPORTED DUE TO COLOR INTERFERENCE OF URINE PIGMENT   Leukocytes,Ua (A) NEGATIVE    TEST NOT REPORTED DUE TO COLOR INTERFERENCE OF URINE PIGMENT   RBC / HPF >50 (H) 0 - 5 RBC/hpf   WBC, UA 0-5 0 - 5 WBC/hpf   Bacteria, UA NONE SEEN NONE SEEN   Squamous Epithelial / LPF NONE SEEN 0 - 5    Comment: Performed at [facility], [address] [place], [place] [id number]  Basic metabolic panel     Status: Abnormal   Collection Time: 11/05/18 10:38 PM  Result Value Ref Range   Sodium 138 135 - 145 mmol/L   Potassium 4.1 3.5 - 5.1 mmol/L   Chloride 106 98 - 111 mmol/L   CO2 23 22 - 32 mmol/L   Glucose, Bld 105 (H) 70 - 99 mg/dL   BUN 13 8 - 23 mg/dL   Creatinine, Ser 1.21 0.61 - 1.24 mg/dL   Calcium 9.1 8.9 - 10.3 mg/dL   GFR calc non Af Amer 59 (L) >60 mL/min   GFR calc Af Amer >60 >60 mL/min   Anion gap 9 5 - 15    Comment: Performed at [facility], [address] [place], [place] [address]  CBC     Status: Abnormal   Collection Time: 11/05/18 10:38 PM  Result Value Ref Range   WBC 13.7 (H) 4.0 - 10.5 K/uL   RBC 5.00 4.22 - 5.81 MIL/uL   Hemoglobin 15.1 13.0 - 17.0 g/dL   HCT 44.4 39.0 - 52.0 %   MCV 88.8 80.0 - 100.0 fL   MCH 30.2 26.0 - 34.0 pg   MCHC  34.0 30.0 - 36.0 g/dL   RDW 12.6 11.5 - 15.5 %   Platelets 182 150 - 400 K/uL   nRBC 0.0 0.0 - 0.2 %    Comment: Performed at [facility], [address] [place], [place] [address]  Procalcitonin     Status:  None   Collection Time: 11/05/18 10:38 PM  Result Value Ref Range   Procalcitonin <0.10 ng/mL    Comment:        Interpretation: PCT (Procalcitonin) <= 0.5 ng/mL: Systemic infection (sepsis) is not likely. Local bacterial infection is possible. (NOTE)       Sepsis PCT Algorithm           Lower Respiratory Tract                                      Infection PCT Algorithm    ----------------------------     ----------------------------         PCT < 0.25 ng/mL                PCT < 0.10 ng/mL         Strongly encourage             Strongly discourage   discontinuation of antibiotics    initiation of antibiotics    ----------------------------     -----------------------------       PCT 0.25 - 0.50 ng/mL            PCT 0.10 - 0.25 ng/mL               OR       >80% decrease in PCT            Discourage initiation of                                            antibiotics      Encourage discontinuation           of antibiotics    ----------------------------     -----------------------------         PCT >= 0.50 ng/mL              PCT 0.26 - 0.50 ng/mL               AND        <80% decrease in PCT             Encourage initiation of                                             antibiotics       Encourage continuation           of antibiotics    ----------------------------     -----------------------------        PCT >= 0.50 ng/mL                  PCT > 0.50 ng/mL               AND         increase in PCT                  Strongly encourage                                      initiation of antibiotics    Strongly encourage escalation  of antibiotics                                     -----------------------------                                            PCT <= 0.25 ng/mL                                                 OR                                        > 80% decrease in PCT                                     Discontinue / Do not initiate                                             antibiotics Performed at [facility], [address] [place], [place] [address]   SARS Coronavirus 2 [facility] order, Performed in [facility] hospital lab) Nasopharyngeal Nasopharyngeal Swab     Status: None   Collection Time: 11/06/18 12:20 AM   Specimen: Nasopharyngeal Swab  Result Value Ref Range   SARS Coronavirus 2 NEGATIVE NEGATIVE    Comment: (NOTE) If result is NEGATIVE SARS-CoV-2 target nucleic acids are NOT DETECTED. The SARS-CoV-2 RNA is generally detectable in upper and lower  respiratory specimens during the acute phase of infection. The lowest  concentration of SARS-CoV-2 viral copies this assay can detect is 250  copies / mL. A negative result does not preclude SARS-CoV-2 infection  and should not be used as the sole basis for treatment or other  patient management decisions.  A negative result may occur with  improper specimen collection / handling, submission of specimen other  than nasopharyngeal swab, presence of viral mutation(s) within the  areas targeted by this assay, and inadequate number of viral copies  (<250 copies / mL). A negative result must be combined with clinical  observations, patient history, and epidemiological information. If result is POSITIVE SARS-CoV-2 target nucleic acids are DETECTED. The SARS-CoV-2 RNA is generally detectable in upper and lower  respiratory specimens dur ing the acute phase of infection.  Positive  results are indicative of active infection with SARS-CoV-2.  Clinical  correlation with patient history and other diagnostic information is  necessary to determine patient infection status.  Positive results do  not rule out bacterial infection or co-infection with other  viruses. If result is PRESUMPTIVE POSTIVE SARS-CoV-2 nucleic acids MAY BE PRESENT.   A presumptive positive result was obtained on the submitted specimen  and confirmed on repeat testing.  While 2019 novel coronavirus  (SARS-CoV-2) nucleic acids may be present in the submitted sample  additional confirmatory testing may be necessary for epidemiological  and / or clinical management purposes  to  differentiate between  SARS-CoV-2 and other Sarbecovirus currently known to infect humans.  If clinically indicated additional testing with an alternate test  methodology [phone]) is advised. The SARS-CoV-2 RNA is generally  detectable in upper and lower respiratory sp ecimens during the acute  phase of infection. The expected result is Negative. Fact Sheet for Patients:  StrictlyIdeas.no Fact Sheet for Healthcare Providers: BankingDealers.co.za This test is not yet approved or cleared by the [place] FDA and has been authorized for detection and/or diagnosis of SARS-CoV-2 by FDA under an Emergency Use Authorization (EUA).  This EUA will remain in effect (meaning this test can be used) for the duration of the COVID-19 declaration under Section 564(b)(1) of the Act, 21 U.S.C. section 360bbb-3(b)(1), unless the authorization is terminated or revoked sooner. Performed at [facility], [address] [place], [place] [address]   Lactic acid, plasma     Status: None   Collection Time: 11/06/18 12:20 AM  Result Value Ref Range   Lactic Acid, Venous 1.0 0.5 - 1.9 mmol/L    Comment: Performed at [facility], [address] [place], [place] [address]   Ct Abdomen Pelvis W Contrast  Result Date: 11/06/2018 CLINICAL DATA:  Urinary urgency, hematuria EXAM: CT ABDOMEN AND PELVIS WITH CONTRAST TECHNIQUE: Multidetector CT imaging of the abdomen and pelvis was performed using the standard protocol following bolus administration of  intravenous contrast. CONTRAST:  121mL OMNIPAQUE IOHEXOL 300 MG/ML  SOLN COMPARISON:  Same day chest radiograph FINDINGS: Lower chest: Lung bases are clear. Normal heart size. No pericardial effusion. Atherosclerotic calcification of the coronary arteries. Hepatobiliary: Subcentimeter hypoattenuating lesion in the left lobe liver (2/17) too small to fully characterize on CT imaging but statistically likely benign. No concerning liver lesions. No gallstones, gallbladder wall thickening, or biliary dilatation. Pancreas: Unremarkable. No pancreatic ductal dilatation or surrounding inflammatory changes. Spleen: Normal in size without focal abnormality. Adrenals/Urinary Tract: Normal adrenal glands. Several fluid attenuation cysts are seen in both kidneys, probable benign simple cysts largest on the right measures 4.9 cm largest on the left measures 2.2 cm. Additional subcentimeter hypoattenuating foci are too small to fully characterize on CT imaging but statistically likely benign. Nonobstructing calculus is present in the lower pole left kidney (5/66) no obstructive urolithiasis or hydronephrosis. Mild bilateral symmetric perinephric stranding, a nonspecific finding. Urinary bladder is largely decompressed but demonstrates perivesicular haze in some luminal hyperattenuation. Stomach/Bowel: Distal esophagus, stomach and duodenal sweep are unremarkable. No bowel wall thickening or dilatation. No evidence of obstruction. A normal appendix is visualized. Vascular/Lymphatic: Atherosclerotic plaque within the normal caliber aorta. No suspicious or enlarged lymph nodes in the included lymphatic chains. Reproductive: The prostate and seminal vesicles are unremarkable. Other: Postsurgical changes from prior right inguinal hernia repair. Fat containing left inguinal hernia. No bowel containing hernia. No free abdominopelvic fluid or gas. No organized collection or abscess. Musculoskeletal: Multilevel degenerative changes are  present in the imaged portions of the spine. 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 IMPRESSION: 1. Circumferential mural thickening and perivesicular hazy stranding suggestive of cystitis however the bladder is suboptimally evaluated at this time given underdistention. Correlate with patient's symptoms and consider urinalysis if not previously obtained. 2. Nonobstructing left nephrolithiasis. No obstructive urolithiasis or hydronephrosis. 3. Aortic Atherosclerosis (ICD10-I70.0). 4. Prior right inguinal hernia repair. Fat containing left inguinal hernia. Electronically Signed   By: Lovena Le M.D.   On: 11/06/2018 01:33   Dg Chest Portable 1 View  Result Date: 11/05/2018 CLINICAL DATA:  The  fever, preop EXAM: PORTABLE CHEST 1 VIEW COMPARISON:  04/07/2018 FINDINGS: Post sternotomy changes. No acute consolidation or effusion. Coarse chronic appearing interstitial opacity. Normal heart size. No pneumothorax. IMPRESSION: No active disease. Electronically Signed   By: Donavan Foil M.D.   On: 11/05/2018 23:53    Pending Labs Unresulted Labs (From admission, onward)    Start     Ordered   11/06/18 [id number]  Basic metabolic panel  Tomorrow morning,   STAT     11/06/18 0153   11/06/18 0500  CBC  Tomorrow morning,   STAT     11/06/18 0153   11/06/18 0154  Hemoglobin and hematocrit, blood  Now then every 6 hours,   STAT     11/06/18 0155   11/06/18 0015  Blood Culture (routine x 2)  BLOOD CULTURE X 2,   STAT     11/06/18 0016   11/06/18 0009  Lactic acid, plasma  STAT Now then every 3 hours,   STAT     11/06/18 0008   11/05/18 2335  Urine Culture  Add-on,   AD    Question:  Patient immune status  Answer:  Normal   11/05/18 2334          Vitals/Pain Today's Vitals   11/05/18 2233 11/05/18 2235  BP:  (!) 158/104  Pulse:  (!) 108  Resp:  20  Temp:  (!) 100.5 F (38.1 C)  TempSrc:  Oral  SpO2:  95%  Weight: 106.6 kg   Height: 6' (1.829 m)   PainSc: 0-No pain      Isolation Precautions No active isolations  Medications Medications  sodium chloride 0.9 % bolus 1,000 mL (1,000 mLs Intravenous New Bag/Given 11/06/18 0229)  cefTRIAXone (ROCEPHIN) 1 g in sodium chloride 0.9 % 100 mL IVPB (1 g Intravenous New Bag/Given 11/06/18 0222)  atorvastatin (LIPITOR) tablet 80 mg (has no administration in time range)  carvedilol (COREG) tablet 12.5 mg (has no administration in time range)  losartan (COZAAR) tablet 50 mg (has no administration in time range)  multivitamin with minerals tablet 1 tablet (has no administration in time range)  0.9 %  sodium chloride infusion ( Intravenous New Bag/Given 11/06/18 0229)  acetaminophen (TYLENOL) tablet 650 mg (has no administration in time range)    Or  acetaminophen (TYLENOL) suppository 650 mg (has no administration in time range)  traZODone (DESYREL) tablet 25 mg (has no administration in time range)  oxyCODONE (Oxy IR/ROXICODONE) immediate release tablet 5 mg (has no administration in time range)  magnesium hydroxide (MILK OF MAGNESIA) suspension 30 mL (has no administration in time range)  ondansetron (ZOFRAN) tablet 4 mg (has no administration in time range)    Or  ondansetron (ZOFRAN) injection 4 mg (has no administration in time range)  cefTRIAXone (ROCEPHIN) 1 g in sodium chloride 0.9 % 100 mL IVPB (has no administration in time range)  iohexol (OMNIPAQUE) 300 MG/ML solution 100 mL (100 mLs Intravenous Contrast Given 11/06/18 0105)    Mobility walks Low fall risk   Focused Assessments Cardiac Assessment Handoff:    Lab Results  Component Value Date   TROPONINI <0.03 04/07/2018   No results found for: DDIMER Does the Patient currently have chest pain? No     R Recommendations: See Admitting Provider Note  Report given to:   Additional Notes:  -

## 2018-11-06 NOTE — TOC Initial Note (Signed)
Transition of Care [facility]) - Initial/Assessment Note    Patient Details  Name: Cory Hess MRN: [id number] Date of Birth: 1945-08-04  Transition of Care [facility]) CM/SW Contact:    Shelbie Hutching, RN Phone Number: 11/06/2018, 4:00 PM  Clinical Narrative:                 Patient admitted for hematuria, reports that he hopes to be discharged tomorrow.  Patient reports that he lives alone and is independent in all ADL's and drives.  Patient follows up with a cardiologist twice per year but he does not have a PCP.  Patient reports when he needs to he goes to the walk in clinic in [place].   RNCM educated patient on the importance of having a PCP and yearly wellness exams. Patient refuses home health services.   No discharge needs identified at this time.   Expected Discharge Plan: Home/Self Care Barriers to Discharge: Continued Medical Work up   Patient Goals and CMS Choice        Expected Discharge Plan and Services Expected Discharge Plan: Home/Self Care       Living arrangements for the past 2 months: Single Family Home                                      Prior Living Arrangements/Services Living arrangements for the past 2 months: Single Family Home Lives with:: Self Patient language and need for interpreter reviewed:: No Do you feel safe going back to the place where you live?: Yes      Need for Family Participation in Patient Care: No (Comment) Care giver support system in place?: Yes (comment)(sisters)   Criminal Activity/Legal Involvement Pertinent to Current Situation/Hospitalization: No - Comment as needed  Activities of Daily Living Home Assistive Devices/Equipment: None ADL Screening (condition at time of admission) Patient's cognitive ability adequate to safely complete daily activities?: Yes Is the patient deaf or have difficulty hearing?: No Does the patient have difficulty seeing, even when wearing glasses/contacts?: No Does the patient have  difficulty concentrating, remembering, or making decisions?: No Patient able to express need for assistance with ADLs?: Yes Does the patient have difficulty dressing or bathing?: No Independently performs ADLs?: Yes (appropriate for developmental age) Does the patient have difficulty walking or climbing stairs?: No Weakness of Legs: None Weakness of Arms/Hands: None  Permission Sought/Granted Permission sought to share information with : Case Manager Permission granted to share information with : Yes, Verbal Permission Granted              Emotional Assessment Appearance:: Appears stated age Attitude/Demeanor/Rapport: Engaged Affect (typically observed): Accepting Orientation: : Oriented to Self, Oriented to Place, Oriented to  Time, Oriented to Situation Alcohol / Substance Use: Not Applicable Psych Involvement: No (comment)  Admission diagnosis:  Fever [R50.9] Bilateral flank pain [R10.9] Hematuria [R31.9] Sepsis, due to unspecified organism, unspecified whether acute organ dysfunction present [facility]) [A41.9] Patient Active Problem List   Diagnosis Date Noted  . Hematuria 11/06/2018  . 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  . Coronary artery disease 08/28/2017  . 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 [facility])    PCP:  Wendall Mola, NP Pharmacy:   [facility] [address]), [place]) [place] [address] Phone: [phone] Fax: [phone]     Social Determinants of Health (SDOH) Interventions  Readmission Risk Interventions No flowsheet data found.

## 2018-11-06 NOTE — Progress Notes (Signed)
[facility] [place] NAME: Cory Hess    MR#:  [id number]  DATE OF BIRTH:  1946/01/15  SUBJECTIVE:  CHIEF COMPLAINT:   Chief Complaint  Patient presents with  . Hematuria   Came to hospital with hematuria.  Lower back pain bilateral.  Also had some fever. Feels better now.  Still have some blood in the urine.  REVIEW OF SYSTEMS:  CONSTITUTIONAL: No fever, fatigue or weakness.  EYES: No blurred or double vision.  EARS, NOSE, AND THROAT: No tinnitus or ear pain.  RESPIRATORY: No cough, shortness of breath, wheezing or hemoptysis.  CARDIOVASCULAR: No chest pain, orthopnea, edema.  GASTROINTESTINAL: No nausea, vomiting, diarrhea or abdominal pain.  GENITOURINARY: No dysuria, hematuria.  ENDOCRINE: No polyuria, nocturia,  HEMATOLOGY: No anemia, easy bruising or bleeding SKIN: No rash or lesion. MUSCULOSKELETAL: No joint pain or arthritis.   NEUROLOGIC: No tingling, numbness, weakness.  PSYCHIATRY: No anxiety or depression.   ROS  DRUG ALLERGIES:  No Known Allergies  VITALS:  Blood pressure (!) 143/70, pulse 75, temperature 98 F (36.7 C), temperature source Oral, resp. rate 17, height 6' (1.829 m), weight 111.3 kg, SpO2 99 %.  PHYSICAL EXAMINATION:  GENERAL:  73 y.o.-year-old patient lying in the bed with no acute distress.  EYES: Pupils equal, round, reactive to light and accommodation. No scleral icterus. Extraocular muscles intact.  HEENT: Head atraumatic, normocephalic. Oropharynx and nasopharynx clear.  NECK:  Supple, no jugular venous distention. No thyroid enlargement, no tenderness.  LUNGS: Normal breath sounds bilaterally, no wheezing, rales,rhonchi or crepitation. No use of accessory muscles of respiration.  CARDIOVASCULAR: S1, S2 normal. No murmurs, rubs, or gallops.  ABDOMEN: Soft, nontender, nondistended. Bowel sounds present. No organomegaly or mass.  EXTREMITIES: No pedal edema, cyanosis, or clubbing.  NEUROLOGIC:  Cranial nerves II through XII are intact. Muscle strength 5/5 in all extremities. Sensation intact. Gait not checked.  PSYCHIATRIC: The patient is alert and oriented x 3.  SKIN: No obvious rash, lesion, or ulcer.   Physical Exam LABORATORY PANEL:   CBC Recent Labs  Lab 11/06/18 0358 11/06/18 1039  WBC 11.4*  --   HGB 13.8 13.7  HCT 41.1 41.0  PLT 157  --    ------------------------------------------------------------------------------------------------------------------  Chemistries  Recent Labs  Lab 11/06/18 0358  NA 140  K 4.2  CL 108  CO2 25  GLUCOSE 97  BUN 13  CREATININE 1.08  CALCIUM 8.5*   ------------------------------------------------------------------------------------------------------------------  Cardiac Enzymes No results for input(s): TROPONINI in the last 168 hours. ------------------------------------------------------------------------------------------------------------------  RADIOLOGY:  Ct Abdomen Pelvis W Contrast  Result Date: 11/06/2018 CLINICAL DATA:  Urinary urgency, hematuria EXAM: CT ABDOMEN AND PELVIS WITH CONTRAST TECHNIQUE: Multidetector CT imaging of the abdomen and pelvis was performed using the standard protocol following bolus administration of intravenous contrast. CONTRAST:  151mL OMNIPAQUE IOHEXOL 300 MG/ML  SOLN COMPARISON:  Same day chest radiograph FINDINGS: Lower chest: Lung bases are clear. Normal heart size. No pericardial effusion. Atherosclerotic calcification of the coronary arteries. Hepatobiliary: Subcentimeter hypoattenuating lesion in the left lobe liver (2/17) too small to fully characterize on CT imaging but statistically likely benign. No concerning liver lesions. No gallstones, gallbladder wall thickening, or biliary dilatation. Pancreas: Unremarkable. No pancreatic ductal dilatation or surrounding inflammatory changes. Spleen: Normal in size without focal abnormality. Adrenals/Urinary Tract: Normal adrenal glands.  Several fluid attenuation cysts are seen in both kidneys, probable benign simple cysts largest on the right measures 4.9 cm largest on the left measures  2.2 cm. Additional subcentimeter hypoattenuating foci are too small to fully characterize on CT imaging but statistically likely benign. Nonobstructing calculus is present in the lower pole left kidney (5/66) no obstructive urolithiasis or hydronephrosis. Mild bilateral symmetric perinephric stranding, a nonspecific finding. Urinary bladder is largely decompressed but demonstrates perivesicular haze in some luminal hyperattenuation. Stomach/Bowel: Distal esophagus, stomach and duodenal sweep are unremarkable. No bowel wall thickening or dilatation. No evidence of obstruction. A normal appendix is visualized. Vascular/Lymphatic: Atherosclerotic plaque within the normal caliber aorta. No suspicious or enlarged lymph nodes in the included lymphatic chains. Reproductive: The prostate and seminal vesicles are unremarkable. Other: Postsurgical changes from prior right inguinal hernia repair. Fat containing left inguinal hernia. No bowel containing hernia. No free abdominopelvic fluid or gas. No organized collection or abscess. Musculoskeletal: Multilevel degenerative changes are present in the imaged portions of the spine. 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 IMPRESSION: 1. Circumferential mural thickening and perivesicular hazy stranding suggestive of cystitis however the bladder is suboptimally evaluated at this time given underdistention. Correlate with patient's symptoms and consider urinalysis if not previously obtained. 2. Nonobstructing left nephrolithiasis. No obstructive urolithiasis or hydronephrosis. 3. Aortic Atherosclerosis (ICD10-I70.0). 4. Prior right inguinal hernia repair. Fat containing left inguinal hernia. Electronically Signed   By: Lovena Le M.D.   On: 11/06/2018 01:33   Dg Chest Portable 1 View  Result Date:  11/05/2018 CLINICAL DATA:  The fever, preop EXAM: PORTABLE CHEST 1 VIEW COMPARISON:  04/07/2018 FINDINGS: Post sternotomy changes. No acute consolidation or effusion. Coarse chronic appearing interstitial opacity. Normal heart size. No pneumothorax. IMPRESSION: No active disease. Electronically Signed   By: Donavan Foil M.D.   On: 11/05/2018 23:53    ASSESSMENT AND PLAN:   Active Problems:   Hematuria  1.  Gross hematuria like secondary to hemorrhagic cystitis.  Patient has sepsis criteria. - follow serial hemoglobin and hematocrits- stable. -On Rocephin IV, follow urine culture. -A urology consultation obtained by Dr. Gloriann Loan -suggested to treat the infection and follow as outpatient for cystoscopy in the next few weeks.   -Will follow blood cultures.  2.  Coronary artery disease status post CABG. -We will hold his aspirin and Plavix and continue his carvedilol and Cozaar as well as statin therapy. -I have called cardiology consult to decide need for Plavix going forward as we may have to hold antiplatelets due to bleeding currently.  We may restart aspirin soon.  3.  Dyslipidemia. -Statin therapy will be resumed.  4.  Hypertension. -We will continue Coreg and Cozaar.  5.  DVT prophylaxis. -SCDs.  Medical prophylaxis is currently held off given his hematuria.    All the records are reviewed and case discussed with Care Management/Social Workerr. Management plans discussed with the patient, family and they are in agreement.  CODE STATUS: full.  TOTAL TIME TAKING CARE OF THIS PATIENT: 35 minutes.     POSSIBLE D/C IN 1-2 DAYS, DEPENDING ON CLINICAL CONDITION.   Vaughan Basta M.D on 11/06/2018   Between 7am to 6pm - Pager - [phone]  After 6pm go to [email or web address] - password [email or web address] [facility]  Office  [phone]  CC: Primary care physician; Wendall Mola, NP  Note: This dictation was prepared with Dragon dictation along with  smaller phrase technology. Any transcriptional errors that result from this process are unintentional.

## 2018-11-06 NOTE — Progress Notes (Signed)
CODE SEPSIS - PHARMACY COMMUNICATION  **Broad Spectrum Antibiotics should be administered within 1 hour of Sepsis diagnosis**  Time Code Sepsis Called/Page Received: @ 0142  Antibiotics Ordered: Ceftriaxone   Time of 1st antibiotic administration: @ 0222  Additional action taken by pharmacy: Spoke with RN @ 0214 about time left to start Abx for Code Sepsis   If necessary, Name of Provider/Nurse Contacted: Dawn, RN  Pernell Dupre, PharmD, BCPS Clinical Pharmacist 11/06/2018 2:10 AM

## 2018-11-06 NOTE — H&P (Signed)
[facility] [place] NAME: Cory Hess    MR#:  [id number]  DATE OF BIRTH:  [birth year]  DATE OF ADMISSION:  11/05/2018  PRIMARY CARE PHYSICIAN: Wendall Mola, NP   REQUESTING/REFERRING PHYSICIAN: Hinda Kehr, MD  CHIEF COMPLAINT:   Chief Complaint  Patient presents with  . Hematuria    HISTORY OF PRESENT ILLNESS:  Cory Hess  is a 73 y.o. Pleasant Caucasian male with a known history of coronary artery disease status post CABG, hypertension and systolic CHF who presented to the emergency room with acute onset of hematuria with associated bilateral low back and flank pain and associated hematuria since 3:30 PM which has been fairly steady occurring about every 15 to 20 minutes.  He denied any abdominal pain.  He had a low-grade fever at home of 99.5 while he is normal is 97.3-97.5.  He denied any nausea vomiting or diarrhea melena or bright red bleeding per rectum.  No chest pain or dyspnea or cough.  No other bleeding diathesis.  Upon presentation to the emergency room temperature was 100.5 with a blood pressure 158/104 and pulse 108 respiratory 20 pulse oximetry 95% on room air.  Labs revealed mild leukocytosis of 13.7 and a urinalysis that revealed more than 50 RBCs and 0-5 WBCs.  He is COVID-19 test came back negative and lactic acid was 1.  Urine and blood cultures were sent.  The patient was given 1 L bolus of IV normal saline and a gram of IV Rocephin.  He will be admitted to a medically monitored bed for further evaluation and management. PAST MEDICAL HISTORY:   Past Medical History:  Diagnosis Date  . Arthritis    neck  . CAD (coronary artery disease)    a. 08/2017 STEMI/Cath: LM 80ost/d, LAD 100p, LCX 95 ost/p, RCA 60p, 72m, RPDA 80ost; b. 08/2017 CABG x 3: LIMA->LAD, VG->OM, VG->PDA.  Marland Kitchen Essential hypertension   . HFrEF (heart failure with reduced ejection fraction) ([place])    a. 08/2017 TEE: EF 30-35%, sept/ant/inf HK,  apical AK. 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  . Hyperlipidemia LDL goal <70   . Ischemic cardiomyopathy    a. 08/2017 TEE: EF 30-35%.  . Myocardial infarction ([place]) 07/2017  . Shingles    2018 - right side  . Vertigo    several months ago    PAST SURGICAL HISTORY:   Past Surgical History:  Procedure Laterality Date  . CATARACT EXTRACTION W/PHACO Left 07/18/2018   Procedure: CATARACT EXTRACTION PHACO AND INTRAOCULAR LENS PLACEMENT ([place])  LEFT;  Surgeon: Eulogio Bear, MD;  Location: [facility];  Service: Ophthalmology;  Laterality: Left;  . CATARACT EXTRACTION W/PHACO Right 08/14/2018   Procedure: CATARACT EXTRACTION PHACO AND INTRAOCULAR LENS PLACEMENT ([place])  RIGHT;  Surgeon: Eulogio Bear, MD;  Location: [facility];  Service: Ophthalmology;  Laterality: Right;  . CORONARY ARTERY BYPASS GRAFT N/A 08/27/2017   Procedure: CORONARY ARTERY BYPASS GRAFTING (CABG) ON PUMP USING LEFT INTERNAL MAMMARY ARTERY AND LEFT GREATER SAPHENOUS VEIN VIA ENDOVEIN HARVEST;  Surgeon: Gaye Pollack, MD;  Location: [facility];  Service: Open Heart Surgery;  Laterality: N/A;  . CORONARY/GRAFT ACUTE MI REVASCULARIZATION N/A 08/27/2017   Procedure: Coronary/Graft Acute MI Revascularization;  Surgeon: Wellington Hampshire, MD;  Location: [facility];  Service: Cardiovascular;  Laterality: N/A;  . HERNIA REPAIR    . LEFT HEART CATH AND CORONARY ANGIOGRAPHY N/A 08/27/2017   Procedure: LEFT HEART CATH  AND CORONARY ANGIOGRAPHY;  Surgeon: Wellington Hampshire, MD;  Location: [facility];  Service: Cardiovascular;  Laterality: N/A;    SOCIAL HISTORY:   Social History   Tobacco Use  . Smoking status: Former Smoker    Types: Cigarettes    Quit date: 2000    Years since quitting: 20.7  . Smokeless tobacco: Current User    Types: Snuff  Substance Use Topics  . Alcohol use: Not Currently    FAMILY HISTORY:   Family History  Problem Relation Age of Onset  . Heart failure Mother   . Lung  disease Father   . Stroke Father     DRUG ALLERGIES:  No Known Allergies  REVIEW OF SYSTEMS:   ROS As per history of present illness. All pertinent systems were reviewed above. Constitutional,  HEENT, cardiovascular, respiratory, GI, GU, musculoskeletal, neuro, psychiatric, endocrine,  integumentary and hematologic systems were reviewed and are otherwise  negative/unremarkable except for positive findings mentioned above in the HPI.   MEDICATIONS AT HOME:   Prior to Admission medications   Medication Sig Start Date End Date Taking? Authorizing Provider  acetaminophen (TYLENOL) 650 MG CR tablet Take 650 mg by mouth every 8 (eight) hours as needed for pain.    [provider]  aspirin EC 81 MG EC tablet Take 1 tablet (81 mg total) by mouth daily. Patient taking differently: Take 81 mg by mouth daily. lunch 09/03/17   Jadene Pierini E, PA-C  atorvastatin (LIPITOR) 80 MG tablet Take 1 tablet (80 mg total) by mouth daily. 09/25/18   Theora Gianotti, NP  carvedilol (COREG) 12.5 MG tablet Take 1 tablet (12.5 mg total) by mouth 2 (two) times daily with a meal. 09/25/18   Theora Gianotti, NP  clopidogrel (PLAVIX) 75 MG tablet Take 1 tablet (75 mg total) by mouth daily. 09/25/18   Theora Gianotti, NP  losartan (COZAAR) 50 MG tablet Take 1 tablet (50 mg total) by mouth daily. 09/25/18   Theora Gianotti, NP  Multiple Vitamin (MULTIVITAMIN WITH MINERALS) TABS tablet Take 1 tablet by mouth daily. One a Day over 50/ lunch    [provider]      VITAL SIGNS:  Blood pressure (!) 158/104, pulse (!) 108, temperature (!) 100.5 F (38.1 C), temperature source Oral, resp. rate 20, height 6' (1.829 m), weight 106.6 kg, SpO2 95 %.  PHYSICAL EXAMINATION:  Physical Exam  GENERAL:  73 y.o.-year-old Caucasian male patient lying in the bed with no acute distress.  EYES: Pupils equal, round, reactive to light and accommodation. No scleral icterus. Extraocular  muscles intact.  HEENT: Head atraumatic, normocephalic. Oropharynx and nasopharynx clear.  NECK:  Supple, no jugular venous distention. No thyroid enlargement, no tenderness.  LUNGS: Normal breath sounds bilaterally, no wheezing, rales,rhonchi or crepitation. No use of accessory muscles of respiration.  CARDIOVASCULAR: Regular rate and rhythm, S1, S2 normal. No murmurs, rubs, or gallops.  ABDOMEN: Soft, nondistended, nontender. Bowel sounds present. No organomegaly or mass.  No CVA tenderness. EXTREMITIES: No pedal edema, cyanosis, or clubbing.  NEUROLOGIC: Cranial nerves II through XII are intact. Muscle strength 5/5 in all extremities. Sensation intact. Gait not checked.  PSYCHIATRIC: The patient is alert and oriented x 3.  Normal affect and good eye contact. SKIN: No obvious rash, lesion, or ulcer.   LABORATORY PANEL:   CBC Recent Labs  Lab 11/05/18 2238  WBC 13.7*  HGB 15.1  HCT 44.4  PLT 182   ------------------------------------------------------------------------------------------------------------------  Chemistries  Recent Labs  Lab 11/05/18 2238  NA 138  K 4.1  CL 106  CO2 23  GLUCOSE 105*  BUN 13  CREATININE 1.21  CALCIUM 9.1   ------------------------------------------------------------------------------------------------------------------  Cardiac Enzymes No results for input(s): TROPONINI in the last 168 hours. ------------------------------------------------------------------------------------------------------------------  RADIOLOGY:  Ct Abdomen Pelvis W Contrast  Result Date: 11/06/2018 CLINICAL DATA:  Urinary urgency, hematuria EXAM: CT ABDOMEN AND PELVIS WITH CONTRAST TECHNIQUE: Multidetector CT imaging of the abdomen and pelvis was performed using the standard protocol following bolus administration of intravenous contrast. CONTRAST:  162mL OMNIPAQUE IOHEXOL 300 MG/ML  SOLN COMPARISON:  Same day chest radiograph FINDINGS: Lower chest: Lung bases are  clear. Normal heart size. No pericardial effusion. Atherosclerotic calcification of the coronary arteries. Hepatobiliary: Subcentimeter hypoattenuating lesion in the left lobe liver (2/17) too small to fully characterize on CT imaging but statistically likely benign. No concerning liver lesions. No gallstones, gallbladder wall thickening, or biliary dilatation. Pancreas: Unremarkable. No pancreatic ductal dilatation or surrounding inflammatory changes. Spleen: Normal in size without focal abnormality. Adrenals/Urinary Tract: Normal adrenal glands. Several fluid attenuation cysts are seen in both kidneys, probable benign simple cysts largest on the right measures 4.9 cm largest on the left measures 2.2 cm. Additional subcentimeter hypoattenuating foci are too small to fully characterize on CT imaging but statistically likely benign. Nonobstructing calculus is present in the lower pole left kidney (5/66) no obstructive urolithiasis or hydronephrosis. Mild bilateral symmetric perinephric stranding, a nonspecific finding. Urinary bladder is largely decompressed but demonstrates perivesicular haze in some luminal hyperattenuation. Stomach/Bowel: Distal esophagus, stomach and duodenal sweep are unremarkable. No bowel wall thickening or dilatation. No evidence of obstruction. A normal appendix is visualized. Vascular/Lymphatic: Atherosclerotic plaque within the normal caliber aorta. No suspicious or enlarged lymph nodes in the included lymphatic chains. Reproductive: The prostate and seminal vesicles are unremarkable. Other: Postsurgical changes from prior right inguinal hernia repair. Fat containing left inguinal hernia. No bowel containing hernia. No free abdominopelvic fluid or gas. No organized collection or abscess. Musculoskeletal: Multilevel degenerative changes are present in the imaged portions of the spine. 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 IMPRESSION: 1. Circumferential mural  thickening and perivesicular hazy stranding suggestive of cystitis however the bladder is suboptimally evaluated at this time given underdistention. Correlate with patient's symptoms and consider urinalysis if not previously obtained. 2. Nonobstructing left nephrolithiasis. No obstructive urolithiasis or hydronephrosis. 3. Aortic Atherosclerosis (ICD10-I70.0). 4. Prior right inguinal hernia repair. Fat containing left inguinal hernia. Electronically Signed   By: Lovena Le M.D.   On: 11/06/2018 01:33   Dg Chest Portable 1 View  Result Date: 11/05/2018 CLINICAL DATA:  The fever, preop EXAM: PORTABLE CHEST 1 VIEW COMPARISON:  04/07/2018 FINDINGS: Post sternotomy changes. No acute consolidation or effusion. Coarse chronic appearing interstitial opacity. Normal heart size. No pneumothorax. IMPRESSION: No active disease. Electronically Signed   By: Donavan Foil M.D.   On: 11/05/2018 23:53      IMPRESSION AND PLAN:   1.  Gross hematuria like secondary to hemorrhagic cystitis.  Patient has sepsis criteria. -He will be admitted to a medically monitored bed. -Will follow serial hemoglobin and hematocrits. -Will obtain urine culture and sensitivity and empirically place him on IV Rocephin for now. -A urology consultation will be obtained by Dr. Gloriann Loan who was notified about the patient as he would likely need a cystoscopy for further assessment. -Will follow blood cultures.  2.  Coronary artery disease status post CABG. -We will hold his aspirin and Plavix and  continue his carvedilol and Cozaar as well as statin therapy.  3.  Dyslipidemia. -Statin therapy will be resumed.  4.  Hypertension. -We will continue Coreg and Cozaar.  5.  DVT prophylaxis. -SCDs.  Medical prophylaxis is currently held off given his hematuria.   All the records are reviewed and case discussed with ED provider. The plan of care was discussed in details with the patient (and family). I answered all questions. The patient  agreed to proceed with the above mentioned plan. Further management will depend upon hospital course.   CODE STATUS: Full code  TOTAL TIME TAKING CARE OF THIS PATIENT: 55 minutes.    Christel Mormon M.D on 11/06/2018 at 1:59 AM  Pager - [phone]  After 6pm go to [email or web address] - Proofreader  [facility]  Office  [phone]  CC: Primary care physician; Wendall Mola, NP   Note: This dictation was prepared with Dragon dictation along with smaller phrase technology. Any transcriptional errors that result from this process are unintentional.

## 2018-11-06 NOTE — Progress Notes (Signed)
Family Meeting Note  Advance Directive:yes  Today a meeting took place with the Patient.   The following clinical team members were present during this meeting:MD  The following were discussed:Patient's diagnosis: Hematuria, history of coronary artery disease, Patient's progosis: Unable to determine and Goals for treatment: Full Code  Additional follow-up to be provided: PMD, Urology, cardiology  Time spent during discussion:20 minutes  Vaughan Basta, MD

## 2018-11-06 NOTE — Consult Note (Signed)
H&P Physician requesting consult: Eugenie Norrie  Chief Complaint: Gross hematuria  History of Present Illness: 74 year old male presented to the emergency department with new onset of gross hematuria since about 3 PM yesterday.  He also had associated dysuria, frequency, urgency, incomplete emptying.  He has never had anything like this before.  He presented to the emergency department with low-grade fever.  Since receiving fluids and antibiotics, he is feeling much improved.  He had a CT scan performed which revealed circumferential bladder wall thickening consistent with cystitis.  This was a noncontrast scan.  He also had a nonobstructing left renal calculus.  He had multiple benign appearing cysts.  These were incompletely characterized due to lack of IV contrast.  Past Medical History:  Diagnosis Date  . Arthritis    neck  . CAD (coronary artery disease)    a. 08/2017 STEMI/Cath: LM 80ost/d, LAD 100p, LCX 95 ost/p, RCA 60p, 45m, RPDA 80ost; b. 08/2017 CABG x 3: LIMA->LAD, VG->OM, VG->PDA.  Marland Kitchen Essential hypertension   . HFrEF (heart failure with reduced ejection fraction) ([place])    a. 08/2017 TEE: EF 30-35%, sept/ant/inf HK, apical AK. 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  . Hyperlipidemia LDL goal <70   . Ischemic cardiomyopathy    a. 08/2017 TEE: EF 30-35%.  . Myocardial infarction ([place]) 07/2017  . Shingles    2018 - right side  . Vertigo    several months ago   Past Surgical History:  Procedure Laterality Date  . CATARACT EXTRACTION W/PHACO Left 07/18/2018   Procedure: CATARACT EXTRACTION PHACO AND INTRAOCULAR LENS PLACEMENT ([place])  LEFT;  Surgeon: Eulogio Bear, MD;  Location: [place];  Service: Ophthalmology;  Laterality: Left;  . CATARACT EXTRACTION W/PHACO Right 08/14/2018   Procedure: CATARACT EXTRACTION PHACO AND INTRAOCULAR LENS PLACEMENT ([place])  RIGHT;  Surgeon: Eulogio Bear, MD;  Location: [place];  Service: Ophthalmology;  Laterality: Right;  . CORONARY ARTERY  BYPASS GRAFT N/A 08/27/2017   Procedure: CORONARY ARTERY BYPASS GRAFTING (CABG) ON PUMP USING LEFT INTERNAL MAMMARY ARTERY AND LEFT GREATER SAPHENOUS VEIN VIA ENDOVEIN HARVEST;  Surgeon: Gaye Pollack, MD;  Location: [place];  Service: Open Heart Surgery;  Laterality: N/A;  . CORONARY/GRAFT ACUTE MI REVASCULARIZATION N/A 08/27/2017   Procedure: Coronary/Graft Acute MI Revascularization;  Surgeon: Wellington Hampshire, MD;  Location: [place] [facility];  Service: Cardiovascular;  Laterality: N/A;  . HERNIA REPAIR    . LEFT HEART CATH AND CORONARY ANGIOGRAPHY N/A 08/27/2017   Procedure: LEFT HEART CATH AND CORONARY ANGIOGRAPHY;  Surgeon: Wellington Hampshire, MD;  Location: [place] [facility];  Service: Cardiovascular;  Laterality: N/A;    Home Medications:  Medications Prior to Admission  Medication Sig Dispense Refill Last Dose  . acetaminophen (TYLENOL) 650 MG CR tablet Take 650 mg by mouth every 8 (eight) hours as needed for pain.   prn at prn  . aspirin EC 81 MG EC tablet Take 1 tablet (81 mg total) by mouth daily. (Patient taking differently: Take 81 mg by mouth daily. lunch)   Unknown at Unknown  . atorvastatin (LIPITOR) 80 MG tablet Take 1 tablet (80 mg total) by mouth daily. 90 tablet 3 Unknown at Unknown  . carvedilol (COREG) 12.5 MG tablet Take 1 tablet (12.5 mg total) by mouth 2 (two) times daily with a meal. 180 tablet 3 Unknown at Unknown  . clopidogrel (PLAVIX) 75 MG tablet Take 1 tablet (75 mg total) by mouth daily. 90 tablet 3 Unknown at  Unknown  . losartan (COZAAR) 50 MG tablet Take 1 tablet (50 mg total) by mouth daily. 90 tablet 3 Unknown at Unknown  . Multiple Vitamin (MULTIVITAMIN WITH MINERALS) TABS tablet Take 1 tablet by mouth daily. One a Day over 50/ lunch   Unknown at Unknown   Allergies: No Known Allergies  Family History  Problem Relation Age of Onset  . Heart failure Mother   . Lung disease Father   . Stroke Father    Social History:  reports that he quit smoking  about 20 years ago. His smoking use included cigarettes. His smokeless tobacco use includes snuff. He reports previous alcohol use. He reports previous drug use.  ROS: A complete review of systems was performed.  All systems are negative except for pertinent findings as noted. ROS   Physical Exam:  Vital signs in last 24 hours: Temp:  [97.9 F (36.6 C)-100.5 F (38.1 C)] 97.9 F (36.6 C) (09/14 0326) Pulse Rate:  [87-108] 91 (09/14 0326) Resp:  [16-20] 16 (09/14 0326) BP: (133-158)/(76-104) 133/86 (09/14 0326) SpO2:  [95 %-98 %] 98 % (09/14 0326) Weight:  [106.6 kg-111.3 kg] 111.3 kg (09/14 0326) General:  Alert and oriented, No acute distress HEENT: Normocephalic, atraumatic Neck: No JVD or lymphadenopathy Cardiovascular: Regular rate and rhythm Lungs: Regular rate and effort Abdomen: Soft, nontender, nondistended, no abdominal masses Back: No CVA tenderness Extremities: No edema Neurologic: Grossly intact  Laboratory Data:  Results for orders placed or performed during the hospital encounter of 11/05/18 (from the past 24 hour(s))  Urinalysis, Complete w Microscopic     Status: Abnormal   Collection Time: 11/05/18 10:38 PM  Result Value Ref Range   Color, Urine RED (A) YELLOW   APPearance CLOUDY (A) CLEAR   Specific Gravity, Urine 1.026 1.005 - 1.030   pH  5.0 - 8.0    TEST NOT REPORTED DUE TO COLOR INTERFERENCE OF URINE PIGMENT   Glucose, UA (A) NEGATIVE mg/dL    TEST NOT REPORTED DUE TO COLOR INTERFERENCE OF URINE PIGMENT   Hgb urine dipstick (A) NEGATIVE    TEST NOT REPORTED DUE TO COLOR INTERFERENCE OF URINE PIGMENT   Bilirubin Urine (A) NEGATIVE    TEST NOT REPORTED DUE TO COLOR INTERFERENCE OF URINE PIGMENT   Ketones, ur (A) NEGATIVE mg/dL    TEST NOT REPORTED DUE TO COLOR INTERFERENCE OF URINE PIGMENT   Protein, ur (A) NEGATIVE mg/dL    TEST NOT REPORTED DUE TO COLOR INTERFERENCE OF URINE PIGMENT   Nitrite (A) NEGATIVE    TEST NOT REPORTED DUE TO COLOR  INTERFERENCE OF URINE PIGMENT   Leukocytes,Ua (A) NEGATIVE    TEST NOT REPORTED DUE TO COLOR INTERFERENCE OF URINE PIGMENT   RBC / HPF >50 (H) 0 - 5 RBC/hpf   WBC, UA 0-5 0 - 5 WBC/hpf   Bacteria, UA NONE SEEN NONE SEEN   Squamous Epithelial / LPF NONE SEEN 0 - 5  Basic metabolic panel     Status: Abnormal   Collection Time: 11/05/18 10:38 PM  Result Value Ref Range   Sodium 138 135 - 145 mmol/L   Potassium 4.1 3.5 - 5.1 mmol/L   Chloride 106 98 - 111 mmol/L   CO2 23 22 - 32 mmol/L   Glucose, Bld 105 (H) 70 - 99 mg/dL   BUN 13 8 - 23 mg/dL   Creatinine, Ser 1.21 0.61 - 1.24 mg/dL   Calcium 9.1 8.9 - 10.3 mg/dL   GFR calc non Af Wyvonnia Lora  59 (L) >60 mL/min   GFR calc Af Amer >60 >60 mL/min   Anion gap 9 5 - 15  CBC     Status: Abnormal   Collection Time: 11/05/18 10:38 PM  Result Value Ref Range   WBC 13.7 (H) 4.0 - 10.5 K/uL   RBC 5.00 4.22 - 5.81 MIL/uL   Hemoglobin 15.1 13.0 - 17.0 g/dL   HCT 44.4 39.0 - 52.0 %   MCV 88.8 80.0 - 100.0 fL   MCH 30.2 26.0 - 34.0 pg   MCHC 34.0 30.0 - 36.0 g/dL   RDW 12.6 11.5 - 15.5 %   Platelets 182 150 - 400 K/uL   nRBC 0.0 0.0 - 0.2 %  Procalcitonin     Status: None   Collection Time: 11/05/18 10:38 PM  Result Value Ref Range   Procalcitonin <0.10 ng/mL  SARS Coronavirus 2 [facility] order, Performed in [place] hospital lab) Nasopharyngeal Nasopharyngeal Swab     Status: None   Collection Time: 11/06/18 12:20 AM   Specimen: Nasopharyngeal Swab  Result Value Ref Range   SARS Coronavirus 2 NEGATIVE NEGATIVE  Lactic acid, plasma     Status: None   Collection Time: 11/06/18 12:20 AM  Result Value Ref Range   Lactic Acid, Venous 1.0 0.5 - 1.9 mmol/L  Blood Culture (routine x 2)     Status: None (Preliminary result)   Collection Time: 11/06/18 12:20 AM   Specimen: BLOOD  Result Value Ref Range   Specimen Description BLOOD RIGHT ANTECUBITAL    Special Requests      BOTTLES DRAWN AEROBIC AND ANAEROBIC Blood Culture results may not be  optimal due to an excessive volume of blood received in culture bottles   Culture      NO GROWTH < 12 HOURS Performed at [facility], [address], [place], [place] [address]    Report Status PENDING   Blood Culture (routine x 2)     Status: None (Preliminary result)   Collection Time: 11/06/18 12:37 AM   Specimen: BLOOD  Result Value Ref Range   Specimen Description BLOOD    Special Requests      BOTTLES DRAWN AEROBIC AND ANAEROBIC Blood Culture adequate volume   Culture      NO GROWTH < 12 HOURS Performed at [facility], [address] [place], [place] [address]    Report Status PENDING   Lactic acid, plasma     Status: None   Collection Time: 11/06/18  3:58 AM  Result Value Ref Range   Lactic Acid, Venous 1.0 0.5 - 1.9 mmol/L  Basic metabolic panel     Status: Abnormal   Collection Time: 11/06/18  3:58 AM  Result Value Ref Range   Sodium 140 135 - 145 mmol/L   Potassium 4.2 3.5 - 5.1 mmol/L   Chloride 108 98 - 111 mmol/L   CO2 25 22 - 32 mmol/L   Glucose, Bld 97 70 - 99 mg/dL   BUN 13 8 - 23 mg/dL   Creatinine, Ser 1.08 0.61 - 1.24 mg/dL   Calcium 8.5 (L) 8.9 - 10.3 mg/dL   GFR calc non Af Amer >60 >60 mL/min   GFR calc Af Amer >60 >60 mL/min   Anion gap 7 5 - 15  CBC     Status: Abnormal   Collection Time: 11/06/18  3:58 AM  Result Value Ref Range   WBC 11.4 (H) 4.0 - 10.5 K/uL   RBC 4.56 4.22 - 5.81  MIL/uL   Hemoglobin 13.8 13.0 - 17.0 g/dL   HCT 41.1 39.0 - 52.0 %   MCV 90.1 80.0 - 100.0 fL   MCH 30.3 26.0 - 34.0 pg   MCHC 33.6 30.0 - 36.0 g/dL   RDW 12.6 11.5 - 15.5 %   Platelets 157 150 - 400 K/uL   nRBC 0.0 0.0 - 0.2 %   Recent Results (from the past 240 hour(s))  SARS Coronavirus 2 [facility] order, Performed in [facility] hospital lab) Nasopharyngeal Nasopharyngeal Swab     Status: None   Collection Time: 11/06/18 12:20 AM   Specimen: Nasopharyngeal Swab  Result Value Ref Range Status   SARS Coronavirus 2 NEGATIVE NEGATIVE  Final    Comment: (NOTE) If result is NEGATIVE SARS-CoV-2 target nucleic acids are NOT DETECTED. The SARS-CoV-2 RNA is generally detectable in upper and lower  respiratory specimens during the acute phase of infection. The lowest  concentration of SARS-CoV-2 viral copies this assay can detect is 250  copies / mL. A negative result does not preclude SARS-CoV-2 infection  and should not be used as the sole basis for treatment or other  patient management decisions.  A negative result may occur with  improper specimen collection / handling, submission of specimen other  than nasopharyngeal swab, presence of viral mutation(s) within the  areas targeted by this assay, and inadequate number of viral copies  (<250 copies / mL). A negative result must be combined with clinical  observations, patient history, and epidemiological information. If result is POSITIVE SARS-CoV-2 target nucleic acids are DETECTED. The SARS-CoV-2 RNA is generally detectable in upper and lower  respiratory specimens dur ing the acute phase of infection.  Positive  results are indicative of active infection with SARS-CoV-2.  Clinical  correlation with patient history and other diagnostic information is  necessary to determine patient infection status.  Positive results do  not rule out bacterial infection or co-infection with other viruses. If result is PRESUMPTIVE POSTIVE SARS-CoV-2 nucleic acids MAY BE PRESENT.   A presumptive positive result was obtained on the submitted specimen  and confirmed on repeat testing.  While 2019 novel coronavirus  (SARS-CoV-2) nucleic acids may be present in the submitted sample  additional confirmatory testing may be necessary for epidemiological  and / or clinical management purposes  to differentiate between  SARS-CoV-2 and other Sarbecovirus currently known to infect humans.  If clinically indicated additional testing with an alternate test  methodology [phone]) is advised. The  SARS-CoV-2 RNA is generally  detectable in upper and lower respiratory sp ecimens during the acute  phase of infection. The expected result is Negative. Fact Sheet for Patients:  StrictlyIdeas.no Fact Sheet for Healthcare Providers: BankingDealers.co.za This test is not yet approved or cleared by the [place] FDA and has been authorized for detection and/or diagnosis of SARS-CoV-2 by FDA under an Emergency Use Authorization (EUA).  This EUA will remain in effect (meaning this test can be used) for the duration of the COVID-19 declaration under Section 564(b)(1) of the Act, 21 U.S.C. section 360bbb-3(b)(1), unless the authorization is terminated or revoked sooner. Performed at [facility], [address], [place], [place] [address]   Blood Culture (routine x 2)     Status: None (Preliminary result)   Collection Time: 11/06/18 12:20 AM   Specimen: BLOOD  Result Value Ref Range Status   Specimen Description BLOOD RIGHT ANTECUBITAL  Final   Special Requests   Final    BOTTLES DRAWN AEROBIC AND  ANAEROBIC Blood Culture results may not be optimal due to an excessive volume of blood received in culture bottles   Culture   Final    NO GROWTH < 12 HOURS Performed at [facility], [address] [place], [place] [address]    Report Status PENDING  Incomplete  Blood Culture (routine x 2)     Status: None (Preliminary result)   Collection Time: 11/06/18 12:37 AM   Specimen: BLOOD  Result Value Ref Range Status   Specimen Description BLOOD  Final   Special Requests   Final    BOTTLES DRAWN AEROBIC AND ANAEROBIC Blood Culture adequate volume   Culture   Final    NO GROWTH < 12 HOURS Performed at [facility], [address], [place], [place] [address]    Report Status PENDING  Incomplete   Creatinine: Recent Labs    11/05/18 2238 11/06/18 0358  CREATININE 1.21 1.08   CT scan personally reviewed and  is detailed in the history of present illness  Impression/Assessment:  Gross hematuria Urinary tract infection Urinary frequency, urgency, dysuria  Plan:  Agree with antibiotics.  He can likely be converted over to oral antibiotics given his stable nature and be treated outpatient with follow-up on urine culture.  From a urological standpoint, he is voiding and hematuria is improving.  No acute indication for intervention.  He can follow-up outpatient for cystoscopy and consideration of CT hematuria protocol.  Marton Redwood, [name] 11/06/2018, 9:13 AM

## 2018-11-07 LAB — BASIC METABOLIC PANEL
Anion gap: 7 (ref 5–15)
BUN: 12 mg/dL (ref 8–23)
CO2: 23 mmol/L (ref 22–32)
Calcium: 8.3 mg/dL — ABNORMAL LOW (ref 8.9–10.3)
Chloride: 110 mmol/L (ref 98–111)
Creatinine, Ser: 0.99 mg/dL (ref 0.61–1.24)
GFR calc Af Amer: >60 mL/min (ref >60)
GFR calc non Af Amer: >60 mL/min (ref >60)
Glucose, Bld: 100 mg/dL — ABNORMAL HIGH (ref 70–99)
Potassium: 3.9 mmol/L (ref 3.5–5.1)
Sodium: 140 mmol/L (ref 135–145)

## 2018-11-07 LAB — CBC
HCT: 39.1 % (ref 39.0–52.0)
Hemoglobin: 13.2 g/dL (ref 13.0–17.0)
MCH: 30.4 pg (ref 26.0–34.0)
MCHC: 33.8 g/dL (ref 30.0–36.0)
MCV: 90.1 fL (ref 80.0–100.0)
Platelets: 144 10*3/uL — ABNORMAL LOW (ref 150–400)
RBC: 4.34 MIL/uL (ref 4.22–5.81)
RDW: 12.6 % (ref 11.5–15.5)
WBC: 8.3 10*3/uL (ref 4.0–10.5)
nRBC: 0 % (ref 0.0–0.2)

## 2018-11-07 MED ORDER — CEFUROXIME AXETIL 250 MG PO TABS: 250.0000 mg | ORAL_TABLET | Freq: Two times a day (BID) | ORAL | 0 refills | Status: AC

## 2018-11-07 NOTE — Progress Notes (Signed)
Chart reviewed. The patient is well known to me. His MI was more than 1 year ago now presenting with hematuria. Plavix can be stopped with no need to resume. Continue Aspirin 81 mg daily unless bleeding is life threatening.

## 2018-11-07 NOTE — Discharge Summary (Signed)
[facility] [place] [facility] NAME: Cory Hess    MR#:  [id number]  DATE OF BIRTH:  September 15, 1945  DATE OF ADMISSION:  11/05/2018 ADMITTING PHYSICIAN: Christel Mormon, MD  DATE OF DISCHARGE: 11/07/18  PRIMARY CARE PHYSICIAN: Wendall Mola, NP    ADMISSION DIAGNOSIS:  Fever [R50.9] Bilateral flank pain [R10.9] Hematuria [R31.9] Sepsis, due to unspecified organism, unspecified whether acute organ dysfunction present ([place]) [A41.9]  DISCHARGE DIAGNOSIS:  Active Problems:   Hematuria  SECONDARY DIAGNOSIS:   Past Medical History:  Diagnosis Date  . Arthritis    neck  . CAD (coronary artery disease)    a. 08/2017 STEMI/Cath: LM 80ost/d, LAD 100p, LCX 95 ost/p, RCA 60p, 48m, RPDA 80ost; b. 08/2017 CABG x 3: LIMA->LAD, VG->OM, VG->PDA.  [place] Kitchen Essential hypertension   . HFrEF (heart failure with reduced ejection fraction) ([place])    a. 08/2017 TEE: EF 30-35%, sept/ant/inf HK, apical AK. 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  . Hyperlipidemia LDL goal <70   . Ischemic cardiomyopathy    a. 08/2017 TEE: EF 30-35%.  . Myocardial infarction ([place]) 07/2017  . Shingles    2018 - right side  . Vertigo    several months ago    HOSPITAL COURSE:  1. Gross hematuria like secondary to hemorrhagic cystitis. Patient has sepsis criteria. - follow serial hemoglobin and hematocrits- stable. -On Rocephin IV, follow urine culture. Grew staphylococcus- but pt feels better. -A urology consultation obtained by Dr. Sherilyn Cooter to treat the infection and follow as outpatient for cystoscopy in the next few weeks.   - no more hematuria now.  2. Coronary artery disease status post CABG. - hold his aspirin and Plavix and continue his carvedilol and Cozaar as well as statin therapy. -I have called cardiology consult to decide need for Plavix going forward as we may have to hold antiplatelets due to bleeding currently.  We may restart aspirin soon. - started aspirin on  discharge and pt stayed stable. No need for plavix as > 1 year of AMI.  3. Dyslipidemia. -Statin therapy will be resumed.  4. Hypertension. -We will continue Coreg and Cozaar.  5. DVT prophylaxis. -SCDs. Medical prophylaxisiscurrently held off given his hematuria.   DISCHARGE CONDITIONS:   Stable.  CONSULTS OBTAINED:    DRUG ALLERGIES:  No Known Allergies  DISCHARGE MEDICATIONS:   Allergies as of 11/07/2018   No Known Allergies     Medication List    STOP taking these medications   clopidogrel 75 MG tablet Commonly known as: PLAVIX     TAKE these medications   acetaminophen 650 MG CR tablet Commonly known as: TYLENOL Take 650 mg by mouth every 8 (eight) hours as needed for pain.   aspirin 81 MG EC tablet Take 1 tablet (81 mg total) by mouth daily. What changed: additional instructions   atorvastatin 80 MG tablet Commonly known as: LIPITOR Take 1 tablet (80 mg total) by mouth daily.   carvedilol 12.5 MG tablet Commonly known as: COREG Take 1 tablet (12.5 mg total) by mouth 2 (two) times daily with a meal.   cefUROXime 250 MG tablet Commonly known as: Ceftin Take 1 tablet (250 mg total) by mouth 2 (two) times daily for 3 days.   losartan 50 MG tablet Commonly known as: COZAAR Take 1 tablet (50 mg total) by mouth daily.   multivitamin with minerals Tabs tablet Take 1 tablet by mouth daily. One a Day over 50/ lunch  DISCHARGE INSTRUCTIONS:   Follow with Urology in 1-2 weeks.  If you experience worsening of your admission symptoms, develop shortness of breath, life threatening emergency, suicidal or homicidal thoughts you must seek medical attention immediately by calling 911 or calling your MD immediately  if symptoms less severe.  You Must read complete instructions/literature along with all the possible adverse reactions/side effects for all the Medicines you take and that have been prescribed to you. Take any new Medicines after  you have completely understood and accept all the possible adverse reactions/side effects.   Please note  You were cared for by a hospitalist during your hospital stay. If you have any questions about your discharge medications or the care you received while you were in the hospital after you are discharged, you can call the unit and asked to speak with the hospitalist on call if the hospitalist that took care of you is not available. Once you are discharged, your primary care physician will handle any further medical issues. Please note that NO REFILLS for any discharge medications will be authorized once you are discharged, as it is imperative that you return to your primary care physician (or establish a relationship with a primary care physician if you do not have one) for your aftercare needs so that they can reassess your need for medications and monitor your lab values.    Today   CHIEF COMPLAINT:   Chief Complaint  Patient presents with  . Hematuria    HISTORY OF PRESENT ILLNESS:  Cory Hess  is a 73 y.o. male with a known history of coronary artery disease status post CABG, hypertension and systolic CHF who presented to the emergency room with acute onset of hematuria with associated bilateral low back and flank pain and associated hematuria since 3:30 PM which has been fairly steady occurring about every 15 to 20 minutes.  He denied any abdominal pain.  He had a low-grade fever at home of 99.5 while he is normal is 97.3-97.5.  He denied any nausea vomiting or diarrhea melena or bright red bleeding per rectum.  No chest pain or dyspnea or cough.  No other bleeding diathesis.  Upon presentation to the emergency room temperature was 100.5 with a blood pressure 158/104 and pulse 108 respiratory 20 pulse oximetry 95% on room air.  Labs revealed mild leukocytosis of 13.7 and a urinalysis that revealed more than 50 RBCs and 0-5 WBCs.  He is COVID-19 test came back negative and lactic acid  was 1.  Urine and blood cultures were sent.  The patient was given 1 L bolus of IV normal saline and a gram of IV Rocephin.  He will be admitted to a medically monitored bed for further evaluation and management. PAST MEDICAL HISTORY:      VITAL SIGNS:  Blood pressure (!) 148/72, pulse 64, temperature 97.8 F (36.6 C), temperature source Oral, resp. rate 17, height 6' (1.829 m), weight 111.3 kg, SpO2 98 %.  I/O:    Intake/Output Summary (Last 24 hours) at 11/07/2018 1042 Last data filed at 11/07/2018 0953 Gross per 24 hour  Intake 1543.95 ml  Output 2450 ml  Net -906.05 ml    PHYSICAL EXAMINATION:  GENERAL:  73 y.o.-year-old patient lying in the bed with no acute distress.  EYES: Pupils equal, round, reactive to light and accommodation. No scleral icterus. Extraocular muscles intact.  HEENT: Head atraumatic, normocephalic. Oropharynx and nasopharynx clear.  NECK:  Supple, no jugular venous distention. No thyroid enlargement, no tenderness.  LUNGS: Normal breath sounds bilaterally, no wheezing, rales,rhonchi or crepitation. No use of accessory muscles of respiration.  CARDIOVASCULAR: S1, S2 normal. No murmurs, rubs, or gallops.  ABDOMEN: Soft, non-tender, non-distended. Bowel sounds present. No organomegaly or mass.  EXTREMITIES: No pedal edema, cyanosis, or clubbing.  NEUROLOGIC: Cranial nerves II through XII are intact. Muscle strength 5/5 in all extremities. Sensation intact. Gait not checked.  PSYCHIATRIC: The patient is alert and oriented x 3.  SKIN: No obvious rash, lesion, or ulcer.   DATA REVIEW:   CBC Recent Labs  Lab 11/07/18 0347  WBC 8.3  HGB 13.2  HCT 39.1  PLT 144*    Chemistries  Recent Labs  Lab 11/07/18 0347  NA 140  K 3.9  CL 110  CO2 23  GLUCOSE 100*  BUN 12  CREATININE 0.99  CALCIUM 8.3*    Cardiac Enzymes No results for input(s): TROPONINI in the last 168 hours.  Microbiology Results  Results for orders placed or performed during  the hospital encounter of 11/05/18  Urine Culture     Status: None (Preliminary result)   Collection Time: 11/05/18 10:38 PM   Specimen: Urine, Random  Result Value Ref Range Status   Specimen Description   Final    URINE, RANDOM Performed at [facility], [address], [place], [place] [address]    Special Requests   Final    Normal Performed at [facility], [address], [place], [place] [address]    Culture   Final    CULTURE REINCUBATED FOR BETTER GROWTH Performed at [facility], [place] [address] [place], [place] [address]    Report Status PENDING  Incomplete  SARS Coronavirus 2 [facility] order, Performed in [facility] hospital lab) Nasopharyngeal Nasopharyngeal Swab     Status: None   Collection Time: 11/06/18 12:20 AM   Specimen: Nasopharyngeal Swab  Result Value Ref Range Status   SARS Coronavirus 2 NEGATIVE NEGATIVE Final    Comment: (NOTE) If result is NEGATIVE SARS-CoV-2 target nucleic acids are NOT DETECTED. The SARS-CoV-2 RNA is generally detectable in upper and lower  respiratory specimens during the acute phase of infection. The lowest  concentration of SARS-CoV-2 viral copies this assay can detect is 250  copies / mL. A negative result does not preclude SARS-CoV-2 infection  and should not be used as the sole basis for treatment or other  patient management decisions.  A negative result may occur with  improper specimen collection / handling, submission of specimen other  than nasopharyngeal swab, presence of viral mutation(s) within the  areas targeted by this assay, and inadequate number of viral copies  (<250 copies / mL). A negative result must be combined with clinical  observations, patient history, and epidemiological information. If result is POSITIVE SARS-CoV-2 target nucleic acids are DETECTED. The SARS-CoV-2 RNA is generally detectable in upper and lower  respiratory specimens dur ing the acute phase of infection.   Positive  results are indicative of active infection with SARS-CoV-2.  Clinical  correlation with patient history and other diagnostic information is  necessary to determine patient infection status.  Positive results do  not rule out bacterial infection or co-infection with other viruses. If result is PRESUMPTIVE POSTIVE SARS-CoV-2 nucleic acids MAY BE PRESENT.   A presumptive positive result was obtained on the submitted specimen  and confirmed on repeat testing.  While 2019 novel coronavirus  (SARS-CoV-2) nucleic acids may be present in the submitted sample  additional confirmatory testing  may be necessary for epidemiological  and / or clinical management purposes  to differentiate between  SARS-CoV-2 and other Sarbecovirus currently known to infect humans.  If clinically indicated additional testing with an alternate test  methodology [phone]) is advised. The SARS-CoV-2 RNA is generally  detectable in upper and lower respiratory sp ecimens during the acute  phase of infection. The expected result is Negative. Fact Sheet for Patients:  StrictlyIdeas.no Fact Sheet for Healthcare Providers: BankingDealers.co.za This test is not yet approved or cleared by the [place] FDA and has been authorized for detection and/or diagnosis of SARS-CoV-2 by FDA under an Emergency Use Authorization (EUA).  This EUA will remain in effect (meaning this test can be used) for the duration of the COVID-19 declaration under Section 564(b)(1) of the Act, 21 U.S.C. section 360bbb-3(b)(1), unless the authorization is terminated or revoked sooner. Performed at [facility], [address], [place], [place] [address]   Blood Culture (routine x 2)     Status: None (Preliminary result)   Collection Time: 11/06/18 12:20 AM   Specimen: BLOOD  Result Value Ref Range Status   Specimen Description BLOOD RIGHT ANTECUBITAL  Final   Special Requests    Final    BOTTLES DRAWN AEROBIC AND ANAEROBIC Blood Culture results may not be optimal due to an excessive volume of blood received in culture bottles   Culture   Final    NO GROWTH 1 DAY Performed at [facility], [address] [place], [place] [address]    Report Status PENDING  Incomplete  Blood Culture (routine x 2)     Status: None (Preliminary result)   Collection Time: 11/06/18 12:37 AM   Specimen: BLOOD  Result Value Ref Range Status   Specimen Description BLOOD  Final   Special Requests   Final    BOTTLES DRAWN AEROBIC AND ANAEROBIC Blood Culture adequate volume   Culture   Final    NO GROWTH 1 DAY Performed at [facility], [address], [place], [place] [address]    Report Status PENDING  Incomplete    RADIOLOGY:  Ct Abdomen Pelvis W Contrast  Result Date: 11/06/2018 CLINICAL DATA:  Urinary urgency, hematuria EXAM: CT ABDOMEN AND PELVIS WITH CONTRAST TECHNIQUE: Multidetector CT imaging of the abdomen and pelvis was performed using the standard protocol following bolus administration of intravenous contrast. CONTRAST:  152mL OMNIPAQUE IOHEXOL 300 MG/ML  SOLN COMPARISON:  Same day chest radiograph FINDINGS: Lower chest: Lung bases are clear. Normal heart size. No pericardial effusion. Atherosclerotic calcification of the coronary arteries. Hepatobiliary: Subcentimeter hypoattenuating lesion in the left lobe liver (2/17) too small to fully characterize on CT imaging but statistically likely benign. No concerning liver lesions. No gallstones, gallbladder wall thickening, or biliary dilatation. Pancreas: Unremarkable. No pancreatic ductal dilatation or surrounding inflammatory changes. Spleen: Normal in size without focal abnormality. Adrenals/Urinary Tract: Normal adrenal glands. Several fluid attenuation cysts are seen in both kidneys, probable benign simple cysts largest on the right measures 4.9 cm largest on the left measures 2.2 cm. Additional subcentimeter  hypoattenuating foci are too small to fully characterize on CT imaging but statistically likely benign. Nonobstructing calculus is present in the lower pole left kidney (5/66) no obstructive urolithiasis or hydronephrosis. Mild bilateral symmetric perinephric stranding, a nonspecific finding. Urinary bladder is largely decompressed but demonstrates perivesicular haze in some luminal hyperattenuation. Stomach/Bowel: Distal esophagus, stomach and duodenal sweep are unremarkable. No bowel wall thickening or dilatation. No evidence of obstruction. A normal appendix  is visualized. Vascular/Lymphatic: Atherosclerotic plaque within the normal caliber aorta. No suspicious or enlarged lymph nodes in the included lymphatic chains. Reproductive: The prostate and seminal vesicles are unremarkable. Other: Postsurgical changes from prior right inguinal hernia repair. Fat containing left inguinal hernia. No bowel containing hernia. No free abdominopelvic fluid or gas. No organized collection or abscess. Musculoskeletal: Multilevel degenerative changes are present in the imaged portions of the spine. 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 IMPRESSION: 1. Circumferential mural thickening and perivesicular hazy stranding suggestive of cystitis however the bladder is suboptimally evaluated at this time given underdistention. Correlate with patient's symptoms and consider urinalysis if not previously obtained. 2. Nonobstructing left nephrolithiasis. No obstructive urolithiasis or hydronephrosis. 3. Aortic Atherosclerosis (ICD10-I70.0). 4. Prior right inguinal hernia repair. Fat containing left inguinal hernia. Electronically Signed   By: Lovena Le M.D.   On: 11/06/2018 01:33   Dg Chest Portable 1 View  Result Date: 11/05/2018 CLINICAL DATA:  The fever, preop EXAM: PORTABLE CHEST 1 VIEW COMPARISON:  04/07/2018 FINDINGS: Post sternotomy changes. No acute consolidation or effusion. Coarse chronic appearing  interstitial opacity. Normal heart size. No pneumothorax. IMPRESSION: No active disease. Electronically Signed   By: Donavan Foil M.D.   On: 11/05/2018 23:53    EKG:   Orders placed or performed during the hospital encounter of 11/05/18  . ED EKG  . ED EKG  . EKG 12-Lead  . EKG 12-Lead      Management plans discussed with the patient, family and they are in agreement.  CODE STATUS:     Code Status Orders  (From admission, onward)         Start     Ordered   11/06/18 0150  Full code  Continuous     11/06/18 0153        Code Status History    Date Active Date Inactive Code Status Order ID Comments User Context   08/28/2017 0213 09/02/2017 2231 Full Code [id number]  Nani Skillern, PA-C Inpatient   Advance Care Planning Activity      TOTAL TIME TAKING CARE OF THIS PATIENT: 35  minutes.    Vaughan Basta M.D on 11/07/2018 at 10:42 AM  Between 7am to 6pm - Pager - [phone]  After 6pm go to [email or web address] - password [email or web address] [facility]  Office  [phone]  CC: Primary care physician; Wendall Mola, NP   Note: This dictation was prepared with Dragon dictation along with smaller phrase technology. Any transcriptional errors that result from this process are unintentional.

## 2018-11-08 LAB — URINE CULTURE
Culture: >=100000 — AB
Special Requests: NORMAL

## 2018-11-09 ENCOUNTER — Telehealth: Payer: Self-pay | Admitting: Urology

## 2018-11-09 NOTE — Telephone Encounter (Signed)
Left pt mess to call, no DPR on file to leave detailed message patient has not been seen in office yet was consulted in the ER. Cysto scheduled with provider on 9-23 Urine culture in ER states he has an infection, he should complete abx and hopefully frequency will resolve

## 2018-11-09 NOTE — Telephone Encounter (Signed)
Pt called and states that he is having frequent urination and wants to know if that's normal with taking ceftin. Please advise.

## 2018-11-10 MED ORDER — SULFAMETHOXAZOLE-TRIMETHOPRIM 800-160 MG PO TABS: 1.0000 | ORAL_TABLET | Freq: Two times a day (BID) | ORAL | 0 refills | Status: DC

## 2018-11-10 NOTE — Telephone Encounter (Signed)
Spoke with patient and he states he is now running a fever of 100.2 still having frequency denies fever or chills. Patient states that he finished abx given in ER Ceftin 6 tablets.  Per Dr. Erlene Quan culture from ER does not show sensitivities for this abx and bactrim DS 14tablets was sent to patient's pharmacy to start today. Patient was instructed that if his fever was to increase to 101 or greater he would need to go to the ER for possible IV abx

## 2018-11-11 ENCOUNTER — Emergency Department
Admission: EM | Admit: 2018-11-11 | Discharge: 2018-11-11 | Disposition: A | Discharge status: Home / Self Care | Payer: Medicare Other | Attending: Emergency Medicine | Admitting: Emergency Medicine

## 2018-11-11 ENCOUNTER — Other Ambulatory Visit: Payer: Self-pay

## 2018-11-11 ENCOUNTER — Emergency Department: Payer: Medicare Other

## 2018-11-11 ENCOUNTER — Encounter: Payer: Self-pay | Admitting: Emergency Medicine

## 2018-11-11 DIAGNOSIS — Z79899 Other long term (current) drug therapy: Secondary | ICD-10-CM | POA: Diagnosis not present

## 2018-11-11 DIAGNOSIS — R509 Fever, unspecified: Secondary | ICD-10-CM | POA: Diagnosis not present

## 2018-11-11 DIAGNOSIS — I259 Chronic ischemic heart disease, unspecified: Secondary | ICD-10-CM | POA: Insufficient documentation

## 2018-11-11 DIAGNOSIS — N390 Urinary tract infection, site not specified: Secondary | ICD-10-CM | POA: Insufficient documentation

## 2018-11-11 DIAGNOSIS — I110 Hypertensive heart disease with heart failure: Secondary | ICD-10-CM | POA: Diagnosis not present

## 2018-11-11 DIAGNOSIS — Z87891 Personal history of nicotine dependence: Secondary | ICD-10-CM | POA: Diagnosis not present

## 2018-11-11 DIAGNOSIS — I5089 Other heart failure: Secondary | ICD-10-CM | POA: Insufficient documentation

## 2018-11-11 DIAGNOSIS — Z7982 Long term (current) use of aspirin: Secondary | ICD-10-CM | POA: Diagnosis not present

## 2018-11-11 LAB — COMPREHENSIVE METABOLIC PANEL
ALT: 23 U/L (ref 0–44)
AST: 23 U/L (ref 15–41)
Albumin: 4 g/dL (ref 3.5–5.0)
Alkaline Phosphatase: 55 U/L (ref 38–126)
Anion gap: 8 (ref 5–15)
BUN: 12 mg/dL (ref 8–23)
CO2: 22 mmol/L (ref 22–32)
Calcium: 8.6 mg/dL — ABNORMAL LOW (ref 8.9–10.3)
Chloride: 105 mmol/L (ref 98–111)
Creatinine, Ser: 1.14 mg/dL (ref 0.61–1.24)
GFR calc Af Amer: >60 mL/min (ref >60)
GFR calc non Af Amer: >60 mL/min (ref >60)
Glucose, Bld: 123 mg/dL — ABNORMAL HIGH (ref 70–99)
Potassium: 3.9 mmol/L (ref 3.5–5.1)
Sodium: 135 mmol/L (ref 135–145)
Total Bilirubin: 1.4 mg/dL — ABNORMAL HIGH (ref 0.3–1.2)
Total Protein: 6.9 g/dL (ref 6.5–8.1)

## 2018-11-11 LAB — URINALYSIS, COMPLETE (UACMP) WITH MICROSCOPIC
Bacteria, UA: NONE SEEN
Bilirubin Urine: NEGATIVE
Glucose, UA: NEGATIVE mg/dL
Hgb urine dipstick: NEGATIVE
Ketones, ur: NEGATIVE mg/dL
Nitrite: NEGATIVE
Protein, ur: 100 mg/dL — AB
Specific Gravity, Urine: 1.027 (ref 1.005–1.030)
Squamous Epithelial / HPF: NONE SEEN (ref 0–5)
WBC, UA: >50 WBC/hpf — ABNORMAL HIGH (ref 0–5)
pH: 6 (ref 5.0–8.0)

## 2018-11-11 LAB — CULTURE, BLOOD (ROUTINE X 2)
Culture: NO GROWTH
Culture: NO GROWTH
Special Requests: ADEQUATE

## 2018-11-11 LAB — LACTIC ACID, PLASMA: Lactic Acid, Venous: 1 mmol/L (ref 0.5–1.9)

## 2018-11-11 LAB — CBC WITH DIFFERENTIAL/PLATELET
Abs Immature Granulocytes: 0.04 10*3/uL (ref 0.00–0.07)
Basophils Absolute: 0.1 10*3/uL (ref 0.0–0.1)
Basophils Relative: 0 %
Eosinophils Absolute: 0.2 10*3/uL (ref 0.0–0.5)
Eosinophils Relative: 1 %
HCT: 39.7 % (ref 39.0–52.0)
Hemoglobin: 13.6 g/dL (ref 13.0–17.0)
Immature Granulocytes: 0 %
Lymphocytes Relative: 10 %
Lymphs Abs: 1.5 10*3/uL (ref 0.7–4.0)
MCH: 30.6 pg (ref 26.0–34.0)
MCHC: 34.3 g/dL (ref 30.0–36.0)
MCV: 89.4 fL (ref 80.0–100.0)
Monocytes Absolute: 1.6 10*3/uL — ABNORMAL HIGH (ref 0.1–1.0)
Monocytes Relative: 10 %
Neutro Abs: 12.1 10*3/uL — ABNORMAL HIGH (ref 1.7–7.7)
Neutrophils Relative %: 79 %
Platelets: 178 10*3/uL (ref 150–400)
RBC: 4.44 MIL/uL (ref 4.22–5.81)
RDW: 12.4 % (ref 11.5–15.5)
WBC: 15.5 10*3/uL — ABNORMAL HIGH (ref 4.0–10.5)
nRBC: 0 % (ref 0.0–0.2)

## 2018-11-11 NOTE — ED Notes (Signed)
Patient seems to be a poor historian. States he was just admitted for UTI, took abx at discharge, and "had a fever of 101" earlier today after taking them. States he goes 8-12 times per day, only a little at a time. Provided with urinal for sample

## 2018-11-11 NOTE — Discharge Instructions (Addendum)
Continue to take and finish your new antibiotic (Bactrim). Check your temperature every 4 hours and take Tylenol for fever greater than  100.4 F. Return to the ER for worsening symptoms, persistent vomiting, difficulty breathing or other concerns.

## 2018-11-11 NOTE — ED Provider Notes (Signed)
[facility] Emergency Department Provider Note   ____________________________________________   First MD Initiated Contact with Patient 11/11/18 0157     (approximate)  I have reviewed the triage vital signs and the nursing notes.   HISTORY  Chief Complaint Fever    HPI Cory Hess is a 73 y.o. male who presents to the ED from home with a chief complaint of fever and UTI. States he was admitted overnight last week for UTI and finished the antibiotic. Ran a low-grade temperature today, called his PCP and was referred to the ED for IV antibiotic. Aside from fever, patient denies cough, chest pain, shortness of breath, abdominal pain, nausea, vomiting or hematuria.        Past Medical History:  Diagnosis Date  . Arthritis    neck  . CAD (coronary artery disease)    a. 08/2017 STEMI/Cath: LM 80ost/d, LAD 100p, LCX 95 ost/p, RCA 60p, 1m, RPDA 80ost; b. 08/2017 CABG x 3: LIMA->LAD, VG->OM, VG->PDA.  Marland Kitchen Essential hypertension   . HFrEF (heart failure with reduced ejection fraction) ([place])    a. 08/2017 TEE: EF 30-35%, sept/ant/inf HK, apical AK. 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  . Hyperlipidemia LDL goal <70   . Ischemic cardiomyopathy    a. 08/2017 TEE: EF 30-35%.  . Myocardial infarction ([place]) 07/2017  . Shingles    2018 - right side  . Vertigo    several months ago    Patient Active Problem List   Diagnosis Date Noted  . Hematuria 11/06/2018  . 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  . Coronary artery disease 08/28/2017  . 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 [facility])     Past Surgical History:  Procedure Laterality Date  . CATARACT EXTRACTION W/PHACO Left 07/18/2018   Procedure: CATARACT EXTRACTION PHACO AND INTRAOCULAR LENS PLACEMENT ([place])  LEFT;  Surgeon: Eulogio Bear, MD;  Location: [place];  Service: Ophthalmology;  Laterality: Left;  . CATARACT EXTRACTION W/PHACO Right 08/14/2018   Procedure: CATARACT EXTRACTION  PHACO AND INTRAOCULAR LENS PLACEMENT ([place])  RIGHT;  Surgeon: Eulogio Bear, MD;  Location: [place];  Service: Ophthalmology;  Laterality: Right;  . CORONARY ARTERY BYPASS GRAFT N/A 08/27/2017   Procedure: CORONARY ARTERY BYPASS GRAFTING (CABG) ON PUMP USING LEFT INTERNAL MAMMARY ARTERY AND LEFT GREATER SAPHENOUS VEIN VIA ENDOVEIN HARVEST;  Surgeon: Gaye Pollack, MD;  Location: [place];  Service: Open Heart Surgery;  Laterality: N/A;  . CORONARY/GRAFT ACUTE MI REVASCULARIZATION N/A 08/27/2017   Procedure: Coronary/Graft Acute MI Revascularization;  Surgeon: Wellington Hampshire, MD;  Location: [place] CV LAB;  Service: Cardiovascular;  Laterality: N/A;  . HERNIA REPAIR    . LEFT HEART CATH AND CORONARY ANGIOGRAPHY N/A 08/27/2017   Procedure: LEFT HEART CATH AND CORONARY ANGIOGRAPHY;  Surgeon: Wellington Hampshire, MD;  Location: [place] CV LAB;  Service: Cardiovascular;  Laterality: N/A;    Prior to Admission medications   Medication Sig Start Date End Date Taking? Authorizing Provider  acetaminophen (TYLENOL) 650 MG CR tablet Take 650 mg by mouth every 8 (eight) hours as needed for pain.    [provider]  aspirin EC 81 MG EC tablet Take 1 tablet (81 mg total) by mouth daily. Patient taking differently: Take 81 mg by mouth daily. lunch 09/03/17   Jadene Pierini E, PA-C  atorvastatin (LIPITOR) 80 MG tablet Take 1 tablet (80 mg total) by mouth daily. 09/25/18   Theora Gianotti, NP  carvedilol ([place])  12.5 MG tablet Take 1 tablet (12.5 mg total) by mouth 2 (two) times daily with a meal. 09/25/18   Theora Gianotti, NP  losartan (COZAAR) 50 MG tablet Take 1 tablet (50 mg total) by mouth daily. 09/25/18   Theora Gianotti, NP  Multiple Vitamin (MULTIVITAMIN WITH MINERALS) TABS tablet Take 1 tablet by mouth daily. One a Day over 50/ lunch    [provider]  sulfamethoxazole-trimethoprim (BACTRIM DS) 800-160 MG tablet Take 1 tablet by mouth every 12  (twelve) hours. 11/10/18   Hollice Espy, MD    Allergies Patient has no known allergies.  Family History  Problem Relation Age of Onset  . Heart failure Mother   . Lung disease Father   . Stroke Father     Social History Social History   Tobacco Use  . Smoking status: Former Smoker    Types: Cigarettes    Quit date: 2000    Years since quitting: 20.7  . Smokeless tobacco: Current User    Types: Snuff  Substance Use Topics  . Alcohol use: Not Currently  . Drug use: Not Currently    Review of Systems  Constitutional: Positive for fever. Eyes: No visual changes. ENT: No sore throat. Cardiovascular: Denies chest pain. Respiratory: Denies shortness of breath. Gastrointestinal: No abdominal pain.  No nausea, no vomiting.  No diarrhea.  No constipation. Genitourinary: Positive for UTI. Negative for dysuria. Musculoskeletal: Negative for back pain. Skin: Negative for rash. Neurological: Negative for headaches, focal weakness or numbness.   ____________________________________________   PHYSICAL EXAM:  VITAL SIGNS: ED Triage Vitals [11/11/18 0018]  Enc Vitals Group     BP 132/75     Pulse Rate (!) 105     Resp 18     Temp 99 F (37.2 C)     Temp Source Oral     SpO2 96 %     Weight 235 lb (106.6 kg)     Height 6' (1.829 m)     Head Circumference      Peak Flow      Pain Score 0     Pain Loc      Pain Edu?      Excl. in [place]?     Constitutional: Alert and oriented. Well appearing and in no acute distress. Eyes: Conjunctivae are normal. PERRL. EOMI. Head: Atraumatic. Nose: No congestion/rhinnorhea. Mouth/Throat: Mucous membranes are moist.  Oropharynx non-erythematous. Neck: No stridor.   Cardiovascular: Normal rate, regular rhythm. Grossly normal heart sounds.  Good peripheral circulation. Respiratory: Normal respiratory effort.  No retractions. Lungs CTAB. Gastrointestinal: Soft and nontender to light and deep palpation. No distention. No abdominal  bruits. No CVA tenderness. Musculoskeletal: No lower extremity tenderness nor edema.  No joint effusions. Neurologic:  Normal speech and language. No gross focal neurologic deficits are appreciated. No gait instability. Skin:  Skin is warm, dry and intact. No rash noted.  No petechiae. Psychiatric: Mood and affect are normal. Speech and behavior are normal.  ____________________________________________   LABS (all labs ordered are listed, but only abnormal results are displayed)  Labs Reviewed  COMPREHENSIVE METABOLIC PANEL - Abnormal; Notable for the following components:      Result Value   Glucose, Bld 123 (*)    Calcium 8.6 (*)    Total Bilirubin 1.4 (*)    All other components within normal limits  CBC WITH DIFFERENTIAL/PLATELET - Abnormal; Notable for the following components:   WBC 15.5 (*)    Neutro Abs 12.1 (*)  Monocytes Absolute 1.6 (*)    All other components within normal limits  URINALYSIS, COMPLETE (UACMP) WITH MICROSCOPIC - Abnormal; Notable for the following components:   Color, Urine YELLOW (*)    APPearance HAZY (*)    Protein, ur 100 (*)    Leukocytes,Ua MODERATE (*)    WBC, UA >50 (*)    All other components within normal limits  LACTIC ACID, PLASMA   ____________________________________________  EKG  None ____________________________________________  RADIOLOGY  ED MD interpretation: No acute cardiopulmonary process  Official radiology report(s): Dg Chest 2 View  Result Date: 11/11/2018 CLINICAL DATA:  Fever EXAM: CHEST - 2 VIEW COMPARISON:  11/05/2018 FINDINGS: The heart size is stable. The patient is status post prior median sternotomy. No pneumothorax. No focal infiltrate. There is no acute osseous abnormality detected. IMPRESSION: No active cardiopulmonary disease. Electronically Signed   By: Constance Holster M.D.   On: 11/11/2018 00:59    ____________________________________________   PROCEDURES  Procedure(s) performed (including  Critical Care):  Procedures   ____________________________________________   INITIAL IMPRESSION / ASSESSMENT AND PLAN / ED COURSE  As part of my medical decision making, I reviewed the following data within the [place] notes reviewed and incorporated, Labs reviewed, Old chart reviewed, Radiograph reviewed and Notes from prior ED visits     Cory Hess was evaluated in Emergency Department on 11/11/2018 for the symptoms described in the history of present illness. He was evaluated in the context of the global COVID-19 pandemic, which necessitated consideration that the patient might be at risk for infection with the SARS-CoV-2 virus that causes COVID-19. Institutional protocols and algorithms that pertain to the evaluation of patients at risk for COVID-19 are in a state of rapid change based on information released by regulatory bodies including the CDC and federal and state organizations. These policies and algorithms were followed during the patient's care in the ED.    73 year old male who presents with fever.  Recently hospitalized for UTI.  Differential diagnosis includes but is not limited to sepsis, UTI, CAP, etc.  Laboratory results noted.  Elevated T bili noted; patient has no abdominal tenderness on exam.  Lactic acid unremarkable.  Review of patient's records demonstrate that his urologist called in a prescription for Bactrim based on the sensitivities of his urine culture last week.  Patient confirms he picked it up and has taken the first pill.  At this point he feels fine and does not wish for IV antibiotic and desires to go home.  There is no clinical evidence of sepsis.  He said he does not like to take medicine so only took 1 Tylenol all day.  I advised him to check his temperature every 4 hours and to take Tylenol if he has fever greater than 100.4 F.  Strict return precautions given.  Patient verbalizes understanding agrees with plan of care.       ____________________________________________   FINAL CLINICAL IMPRESSION(S) / ED DIAGNOSES  Final diagnoses:  Lower urinary tract infectious disease     ED Discharge Orders    None       Note:  This document was prepared using Dragon voice recognition software and may include unintentional dictation errors.   Paulette Blanch, MD 11/11/18 [phone]

## 2018-11-11 NOTE — ED Triage Notes (Signed)
Patient states that he was seen here on Sunday and admitted for a UTI. Patient states that tonight he started running a fever. Patient states that his temperature was 101 about 30 mins ago.

## 2018-11-11 NOTE — ED Notes (Signed)
ED Provider at bedside. 

## 2018-11-11 NOTE — ED Notes (Signed)
Collected urinalysis and urine culture

## 2018-11-15 ENCOUNTER — Ambulatory Visit (INDEPENDENT_AMBULATORY_CARE_PROVIDER_SITE_OTHER): Payer: Medicare Other | Admitting: Urology

## 2018-11-15 ENCOUNTER — Encounter: Payer: Self-pay | Admitting: Urology

## 2018-11-15 ENCOUNTER — Other Ambulatory Visit: Payer: Self-pay

## 2018-11-15 VITALS — BP 148/74 | HR 74 | Ht 72.0 in | Wt 235.0 lb

## 2018-11-15 DIAGNOSIS — R3912 Poor urinary stream: Secondary | ICD-10-CM

## 2018-11-15 DIAGNOSIS — N401 Enlarged prostate with lower urinary tract symptoms: Secondary | ICD-10-CM

## 2018-11-15 DIAGNOSIS — R319 Hematuria, unspecified: Secondary | ICD-10-CM

## 2018-11-15 DIAGNOSIS — I255 Ischemic cardiomyopathy: Secondary | ICD-10-CM

## 2018-11-15 DIAGNOSIS — N3001 Acute cystitis with hematuria: Secondary | ICD-10-CM | POA: Diagnosis not present

## 2018-11-15 MED ORDER — TAMSULOSIN HCL 0.4 MG PO CAPS: 0.4000 mg | ORAL_CAPSULE | Freq: Every day | ORAL | 11 refills | Status: DC

## 2018-11-15 NOTE — Patient Instructions (Signed)
Hematuria, Adult Hematuria is blood in the urine. Blood may be visible in the urine, or it may be identified with a test. This condition can be caused by infections of the bladder, urethra, kidney, or prostate. Other possible causes include:  Kidney stones.  Cancer of the urinary tract.  Too much calcium in the urine.  Conditions that are passed from parent to child (inherited conditions).  Exercise that requires a lot of energy. Infections can usually be treated with medicine, and a kidney stone usually will pass through your urine. If neither of these is the cause of your hematuria, more tests may be needed to identify the cause of your symptoms. It is very important to tell your health care provider about any blood in your urine, even if it is painless or the blood stops without treatment. Blood in the urine, when it happens and then stops and then happens again, can be a symptom of a very serious condition, including cancer. There is no pain in the initial stages of many urinary cancers. Follow these instructions at home: Medicines  Take over-the-counter and prescription medicines only as told by your health care provider.  If you were prescribed an antibiotic medicine, take it as told by your health care provider. Do not stop taking the antibiotic even if you start to feel better. Eating and drinking  Drink enough fluid to keep your urine clear or pale yellow. It is recommended that you drink 3-4 quarts (2.8-3.8 L) a day. If you have been diagnosed with an infection, it is recommended that you drink cranberry juice in addition to large amounts of water.  Avoid caffeine, tea, and carbonated beverages. These tend to irritate the bladder.  Avoid alcohol because it may irritate the prostate (men). General instructions  If you have been diagnosed with a kidney stone, follow your health care provider's instructions about straining your urine to catch the stone.  Empty your bladder  often. Avoid holding urine for long periods of time.  If you are male: ? After a bowel movement, wipe from front to back and use each piece of toilet paper only once. ? Empty your bladder before and after sex.  Pay attention to any changes in your symptoms. Tell your health care provider about any changes or any new symptoms.  It is your responsibility to get your test results. Ask your health care provider, or the department performing the test, when your results will be ready.  Keep all follow-up visits as told by your health care provider. This is important. Contact a health care provider if:  You develop back pain.  You have a fever.  You have nausea or vomiting.  Your symptoms do not improve after 3 days.  Your symptoms get worse. Get help right away if:  You develop severe vomiting and are unable take medicine without vomiting.  You develop severe pain in your back or abdomen even though you are taking medicine.  You pass a large amount of blood in your urine.  You pass blood clots in your urine.  You feel very weak or like you might faint.  You faint. Summary  Hematuria is blood in the urine. It has many possible causes.  It is very important that you tell your health care provider about any blood in your urine, even if it is painless or the blood stops without treatment.  Take over-the-counter and prescription medicines only as told by your health care provider.  Drink enough fluid to keep   your urine clear or pale yellow. This information is not intended to replace advice given to you by your health care provider. Make sure you discuss any questions you have with your health care provider. Document Released: 02/08/2005 Document Revised: 01/21/2017 Document Reviewed: 03/13/2016 Elsevier Patient Education  2020 Elsevier Inc.  

## 2018-11-15 NOTE — Progress Notes (Signed)
11/15/2018 3:31 PM   Cory Hess [birth year] [id number]  Referring provider: Wendall Mola, NP [place],  [place] [address]  Chief Complaint  Patient presents with  . Cysto    HPI:  Follow-up-patient seen as  consult by Dr. Gloriann Loan- he was seen for gross hematuria, frequency, urgency and dysuria.. He had blood clots. A urine culture grew greater than 100,000 Staphylococcus saprophyticus.  He was treated with antibiotics and discharged on cefuroxime.  He then completed a 7-day course of Bactrim which had better coverage for Staphylococcus.  He underwent CT scan of the abdomen and pelvis on 11/06/2018 with IV contrast.  This revealed benign renal cysts and prostate was about 55 g. No BPH treatment.   Today, the hematuria cleared. The dysuria and frequency cleared. He was a smoker.    PMH: Past Medical History:  Diagnosis Date  . Arthritis    neck  . CAD (coronary artery disease)    a. 08/2017 STEMI/Cath: LM 80ost/d, LAD 100p, LCX 95 ost/p, RCA 60p, 50m, RPDA 80ost; b. 08/2017 CABG x 3: LIMA->LAD, VG->OM, VG->PDA.  Marland Kitchen Essential hypertension   . HFrEF (heart failure with reduced ejection fraction) ([place])    a. 08/2017 TEE: EF 30-35%, sept/ant/inf HK, apical AK. 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  . Hyperlipidemia LDL goal <70   . Ischemic cardiomyopathy    a. 08/2017 TEE: EF 30-35%.  . Myocardial infarction ([place]) 07/2017  . Shingles    2018 - right side  . Vertigo    several months ago    Surgical History: Past Surgical History:  Procedure Laterality Date  . CATARACT EXTRACTION W/PHACO Left 07/18/2018   Procedure: CATARACT EXTRACTION PHACO AND INTRAOCULAR LENS PLACEMENT ([place])  LEFT;  Surgeon: Eulogio Bear, MD;  Location: [place];  Service: Ophthalmology;  Laterality: Left;  . CATARACT EXTRACTION W/PHACO Right 08/14/2018   Procedure: CATARACT EXTRACTION PHACO AND INTRAOCULAR LENS PLACEMENT ([place])  RIGHT;  Surgeon: Eulogio Bear, MD;  Location: [place];  Service: Ophthalmology;  Laterality: Right;  . CORONARY ARTERY BYPASS GRAFT N/A 08/27/2017   Procedure: CORONARY ARTERY BYPASS GRAFTING (CABG) ON PUMP USING LEFT INTERNAL MAMMARY ARTERY AND LEFT GREATER SAPHENOUS VEIN VIA ENDOVEIN HARVEST;  Surgeon: Gaye Pollack, MD;  Location: [place];  Service: Open Heart Surgery;  Laterality: N/A;  . CORONARY/GRAFT ACUTE MI REVASCULARIZATION N/A 08/27/2017   Procedure: Coronary/Graft Acute MI Revascularization;  Surgeon: Wellington Hampshire, MD;  Location: [place] CV LAB;  Service: Cardiovascular;  Laterality: N/A;  . HERNIA REPAIR    . LEFT HEART CATH AND CORONARY ANGIOGRAPHY N/A 08/27/2017   Procedure: LEFT HEART CATH AND CORONARY ANGIOGRAPHY;  Surgeon: Wellington Hampshire, MD;  Location: [place] CV LAB;  Service: Cardiovascular;  Laterality: N/A;    Home Medications:  Allergies as of 11/15/2018   No Known Allergies     Medication List       Accurate as of November 15, 2018  3:31 PM. If you have any questions, ask your nurse or doctor.        acetaminophen 650 MG CR tablet Commonly known as: TYLENOL Take 650 mg by mouth every 8 (eight) hours as needed for pain.   aspirin 81 MG EC tablet Take 1 tablet (81 mg total) by mouth daily. What changed: additional instructions   atorvastatin 80 MG tablet Commonly known as: LIPITOR Take 1 tablet (80 mg total) by mouth daily.   carvedilol 12.5 MG tablet Commonly known as:  COREG Take 1 tablet (12.5 mg total) by mouth 2 (two) times daily with a meal.   losartan 50 MG tablet Commonly known as: COZAAR Take 1 tablet (50 mg total) by mouth daily.   multivitamin with minerals Tabs tablet Take 1 tablet by mouth daily. One a Day over 50/ lunch   sulfamethoxazole-trimethoprim 800-160 MG tablet Commonly known as: BACTRIM DS Take 1 tablet by mouth every 12 (twelve) hours.       Allergies: No Known Allergies  Family History: Family History  Problem Relation Age of Onset  . Heart  failure Mother   . Lung disease Father   . Stroke Father     Social History:  reports that he quit smoking about 20 years ago. His smoking use included cigarettes. His smokeless tobacco use includes snuff. He reports previous alcohol use. He reports previous drug use.  ROS:                                        Physical Exam: There were no vitals taken for this visit.  Constitutional:  Alert and oriented, No acute distress. HEENT: [place] AT, moist mucus membranes.  Trachea midline, no masses. Cardiovascular: No clubbing, cyanosis, or edema. Respiratory: Normal respiratory effort, no increased work of breathing. GI: Abdomen is soft, nontender, nondistended, no abdominal masses GU: No CVA tenderness Skin: No rashes, bruises or suspicious lesions. Neurologic: Grossly intact, no focal deficits, moving all 4 extremities. Psychiatric: Normal mood and affect.  Laboratory Data: Lab Results  Component Value Date   WBC 15.5 (H) 11/11/2018   HGB 13.6 11/11/2018   HCT 39.7 11/11/2018   MCV 89.4 11/11/2018   PLT 178 11/11/2018    Lab Results  Component Value Date   CREATININE 1.14 11/11/2018    No results found for: PSA  No results found for: TESTOSTERONE  Lab Results  Component Value Date   HGBA1C 5.6 08/28/2017    Urinalysis    Component Value Date/Time   COLORURINE YELLOW (A) 11/11/2018 0022   APPEARANCEUR HAZY (A) 11/11/2018 0022   LABSPEC 1.027 11/11/2018 0022   PHURINE 6.0 11/11/2018 0022   GLUCOSEU NEGATIVE 11/11/2018 0022   HGBUR NEGATIVE 11/11/2018 0022   BILIRUBINUR NEGATIVE 11/11/2018 0022   KETONESUR NEGATIVE 11/11/2018 0022   PROTEINUR 100 (A) 11/11/2018 0022   NITRITE NEGATIVE 11/11/2018 0022   LEUKOCYTESUR MODERATE (A) 11/11/2018 0022    Lab Results  Component Value Date   BACTERIA NONE SEEN 11/11/2018    Pertinent Imaging: CT reviewed Dr. Marlan Palau notes  No results found for this or any previous visit. No results found for  this or any previous visit. No results found for this or any previous visit. No results found for this or any previous visit. No results found for this or any previous visit. No results found for this or any previous visit. No results found for this or any previous visit. No results found for this or any previous visit.  Assessment & Plan:    1. Hematuria, unspecified type CT benign. Likely from UTI.  - Urinalysis, Complete  2. UTI - complete bactrim DS.   3. BPH - discussed nature r/b/a to alpha blockers. Will start tamsulosin.  No follow-ups on file.  Festus Aloe, MD  [facility] [address], [place], [place] [address] [phone]

## 2018-11-16 LAB — URINALYSIS, COMPLETE
Bilirubin, UA: NEGATIVE
Glucose, UA: NEGATIVE
Ketones, UA: NEGATIVE
Leukocytes,UA: NEGATIVE
Nitrite, UA: NEGATIVE
Specific Gravity, UA: 1.025 (ref 1.005–1.030)
Urobilinogen, Ur: 0.2 mg/dL (ref 0.2–1.0)
pH, UA: 5.5 (ref 5.0–7.5)

## 2018-11-16 LAB — MICROSCOPIC EXAMINATION
Bacteria, UA: NONE SEEN
RBC, Urine: NONE SEEN /hpf (ref 0–2)

## 2018-12-13 ENCOUNTER — Other Ambulatory Visit: Payer: Self-pay | Admitting: Radiology

## 2018-12-13 ENCOUNTER — Ambulatory Visit (INDEPENDENT_AMBULATORY_CARE_PROVIDER_SITE_OTHER): Payer: Medicare Other | Admitting: Urology

## 2018-12-13 ENCOUNTER — Other Ambulatory Visit: Payer: Self-pay

## 2018-12-13 ENCOUNTER — Encounter: Payer: Self-pay | Admitting: Urology

## 2018-12-13 ENCOUNTER — Telehealth: Payer: Self-pay | Admitting: Cardiovascular Disease

## 2018-12-13 VITALS — BP 146/73 | HR 81 | Ht 72.0 in | Wt 235.0 lb

## 2018-12-13 DIAGNOSIS — Z01818 Encounter for other preprocedural examination: Secondary | ICD-10-CM

## 2018-12-13 DIAGNOSIS — D414 Neoplasm of uncertain behavior of bladder: Secondary | ICD-10-CM

## 2018-12-13 DIAGNOSIS — I255 Ischemic cardiomyopathy: Secondary | ICD-10-CM

## 2018-12-13 DIAGNOSIS — R319 Hematuria, unspecified: Secondary | ICD-10-CM | POA: Diagnosis not present

## 2018-12-13 NOTE — Progress Notes (Signed)
   12/13/18  CC:  Chief Complaint  Patient presents with  . Cysto    HPI:  [name] returns in the evaluation of gross hematuria, UTI and BPH.  He started tamsulosin last visit. He's voding with a better stream. CT scan on 11/06/2018 was benign. No dysuria or gross hematuria since last visit.   There were no vitals taken for this visit. NED. A&Ox3.   No respiratory distress   Abd soft, NT, ND Normal phallus with bilateral descended testicles  Cystoscopy Procedure Note  Patient identification was confirmed, informed consent was obtained, and patient was prepped using Betadine solution.  Lidocaine jelly was administered per urethral meatus.     Pre-Procedure: - Inspection reveals a normal caliber ureteral meatus.  Procedure: The flexible cystoscope was introduced without difficulty - No urethral strictures/lesions are present. -Short with mildly obstructive prostate - normal bladder neck - Bilateral ureteral orifices identified - Bladder mucosa  reveals a small papillary tumor left lateral, lateral to the left ureteral orifice may be slightly anterior  - No bladder stones - No trabeculation  Retroflexion shows small tumor left anterior    Post-Procedure: - Patient tolerated the procedure well  Assessment/ Plan:  Gross hematuria, bladder neoplasm of uncertain malignant potential-I discussed with the patient the nature risk benefits and alternatives to cystoscopy with bladder biopsy and fulguration.  It is a small tumor.  I will leave postoperative chemo instillation up to the operative surgeon.  I discussed with the patient one of my colleagues would to be doing the procedure.  No follow-ups on file.  Festus Aloe, MD

## 2018-12-13 NOTE — Patient Instructions (Signed)
Bladder Biopsy  A bladder biopsy is a procedure to remove a small sample of tissue from the bladder. The procedure is done so that the tissue can be examined under a microscope. You may have a bladder biopsy to diagnose or rule out bladder cancer. During your bladder biopsy, your health care provider may insert a long, thin scope with a tiny lighted camera (cystoscope) into the tube that leads from your bladder to the outside of your body (urethra) and move it into your bladder. The cystoscope will allow your health care provider to examine the lining of the urethra and bladder and remove the tissue sample. If your health provider also needs to examine the tubes that connect the kidneys to the bladder (ureters), a longer tube (ureteroscope) may be used. Tell a health care provider about:  Any allergies you have.  All medicines you are taking, including vitamins, herbs, eye drops, creams, and over-the-counter medicines.  Any problems you or family members have had with anesthetic medicines.  Any blood disorders you have.  Any surgeries you have had.  Any medical conditions you have.  Whether you are pregnant or may be pregnant. What are the risks? Generally, this is a safe procedure. However, problems may occur, including:  Unusual bleeding.  Infection, especially a urinary tract infection (UTI).  Allergic reactions to medicines.  Damage to the surrounding organs, including the urethra, bladder, or ureters.  Abdominal pain.  Burning or pain during urination.  Narrowing of the urethra due to scar tissue.  Difficulty urinating due to swelling. What happens before the procedure?  You may be given antibiotic medicine to help prevent infection.  Ask your health care provider about: ? Changing or stopping your regular medicines. This is especially important if you are taking diabetes medicines or blood thinners. ? Taking medicines such as aspirin and ibuprofen. These medicines can  thin your blood. Do not take these medicines before your procedure if your health care provider instructs you not to.  Follow instructions from your health care provider about eating and drinking restrictions.  You may be asked to drink plenty of fluids.  You may be asked to urinate right before the procedure. You may have a urine sample taken for UTI testing.  Plan to have someone take you home from the hospital or clinic.  If you will be going home right after the procedure, plan to have someone with you for 24 hours. What happens during the procedure?  To lower your risk of infection: ? Your health care team will wash or sanitize their hands. ? Your skin will be washed with soap.  An IV tube may be inserted into one of your veins.  You may be given one or more of the following: ? A medicine to help you relax (sedative). ? A medicine to numb the opening of the urethra (local anesthetic). ? A medicine to make you fall asleep (general anesthetic). ? A medicine that is injected into your spine to numb the area below and slightly above the injection site (spinal anesthetic). ? A medicine that is injected into an area of your body to numb everything below the injection site (regional anesthetic).  You will lie on your back with your knees bent and spread apart.  The cystoscope or ureteroscope will be inserted into your urethra and guided into your bladder or ureters.  Your bladder may be slowly filled with germ-free (sterile) water. This will make it easier for your health care provider to view  the wall or lining of your bladder.  Small instruments will be inserted through the scope to collect a small tissue sample that will be examined under a microscope. The procedure may vary among health care providers and hospitals. What happens after the procedure?  You may be asked to empty your bladder, or your bladder may be emptied for you.  Your blood pressure, heart rate, breathing  rate, and blood oxygen level will be monitored until the medicines you were given wear off.  Do not drive for 24 hours if you received a sedative. This information is not intended to replace advice given to you by your health care provider. Make sure you discuss any questions you have with your health care provider. Document Released: 02/25/2015 Document Revised: 07/17/2015 Document Reviewed: 02/25/2015 Elsevier Patient Education  2020 [employer].

## 2018-12-13 NOTE — Telephone Encounter (Signed)
° °  [facility] Pre-operative Risk Assessment    Request for surgical clearance:  1. What type of surgery is being performed?  Cystoscopy, bladder biopsy, fulguration for bladder neoplasm  2. When is this surgery scheduled? 12/25/18   3. What type of clearance is required (medical clearance vs. Pharmacy clearance to hold med vs. Both)? Both - for CAS, STEMI, h/o CABG   4. Are there any medications that need to be held prior to surgery and how long? Hold ASA 81 mg x 7 days prior to surgery    5. Practice name and name of physician performing surgery? [facility], Dr Hollice Espy   6. What is your office phone number [phone]    7.   What is your office fax number [phone]  8.   Anesthesia type (None, local, MAC, general) ? None listed   Cory Hess 12/13/2018, 4:46 PM  _________________________________________________________________   (provider comments below)

## 2018-12-14 LAB — URINALYSIS, COMPLETE
Bilirubin, UA: NEGATIVE
Glucose, UA: NEGATIVE
Ketones, UA: NEGATIVE
Leukocytes,UA: NEGATIVE
Nitrite, UA: NEGATIVE
Protein,UA: NEGATIVE
RBC, UA: NEGATIVE
Specific Gravity, UA: 1.025 (ref 1.005–1.030)
Urobilinogen, Ur: 0.2 mg/dL (ref 0.2–1.0)
pH, UA: 5.5 (ref 5.0–7.5)

## 2018-12-14 LAB — MICROSCOPIC EXAMINATION
Bacteria, UA: NONE SEEN
Epithelial Cells (non renal): NONE SEEN /hpf (ref 0–10)
RBC, Urine: NONE SEEN /hpf (ref 0–2)

## 2018-12-14 NOTE — Telephone Encounter (Signed)
Patient contacted via phone for preoperative cardiovascular evaluation.  Patient was not available at the time of the call and a voice message was left.  Patient needs call back.

## 2018-12-14 NOTE — Telephone Encounter (Signed)
Hold aspirin 1 week before procedure and resume after.

## 2018-12-15 ENCOUNTER — Telehealth: Payer: Self-pay | Admitting: Radiology

## 2018-12-15 NOTE — Telephone Encounter (Signed)
Patient is returning the call  

## 2018-12-15 NOTE — Telephone Encounter (Signed)
   Primary Cardiologist: Nelva Bush, MD  Chart reviewed as part of pre-operative protocol coverage. Given past medical history and time since last visit, based on ACC/AHA guidelines, Cory Hess would be at acceptable risk for the planned procedure without further cardiovascular testing.  Spoke with patient 12/15/2018 and he was doing well from a cardiac standpoint. Dr. Fletcher Anon contacted in regards to holding recommendations. It will be acceptable to hold ASA for one week prior to procedure and resume when safe from a surgical standpoint. Pt has concerns about the procedure and he was encouraged to contact the surgical team to help answer questions.    I will route this recommendation to the requesting party via Epic fax function and remove from pre-op pool.  Please call with questions.  Kathyrn Drown, NP 12/15/2018, 1:18 PM

## 2018-12-15 NOTE — Telephone Encounter (Signed)
LMOM regarding preadmission testing appointment on 12/21/2018 at 8:00 with COVID test to follow and also to hold aspirin beginning 12/18/2018.

## 2018-12-18 LAB — CULTURE, URINE COMPREHENSIVE

## 2018-12-19 ENCOUNTER — Telehealth (INDEPENDENT_AMBULATORY_CARE_PROVIDER_SITE_OTHER): Payer: Medicare Other | Admitting: Urology

## 2018-12-19 ENCOUNTER — Other Ambulatory Visit: Payer: Self-pay | Admitting: *Deleted

## 2018-12-19 ENCOUNTER — Other Ambulatory Visit: Payer: Self-pay

## 2018-12-19 DIAGNOSIS — R319 Hematuria, unspecified: Secondary | ICD-10-CM

## 2018-12-19 DIAGNOSIS — D414 Neoplasm of uncertain behavior of bladder: Secondary | ICD-10-CM

## 2018-12-19 MED ORDER — NITROFURANTOIN MONOHYD MACRO 100 MG PO CAPS: 100.0000 mg | ORAL_CAPSULE | Freq: Two times a day (BID) | ORAL | 0 refills | Status: DC

## 2018-12-19 NOTE — Progress Notes (Signed)
Virtual Visit via Telephone Note  I connected with Cory Hess on 12/19/18 at  9:15 AM EDT by telephone and verified that I am speaking with the correct person using two identifiers.  Location: Patient: Home Provider: Office   I discussed the limitations, risks, security and privacy concerns of performing an evaluation and management service by telephone and the availability of in person appointments. I also discussed with the patient that there may be a patient responsible charge related to this service. The patient expressed understanding and agreed to proceed.   History of Present Illness: 73 year old male with a history of hematuria who underwent hematuria evaluation with Dr. 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  He scheduled for TURBT and instillation of intravesical gemcitabine early next week.  He requested a virtual telephone visit with me today as he has multiple questions.  He has a history of microscopic hematuria and underwent evaluation.  Previous CT scan with contrast with delayed to the level of the mid ureter indicate no GU pathology.  He underwent cystoscopy with one of my partners, Dr. Junious Silk indicating a small approximately 1 cm papillary lesion highly concerning for malignancy.  He has been scheduled for surgery but has multiple questions and requested this appointment.  Primarily, he is concerned about the procedure itself, postoperative care, and possible complications.   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  Follow Up Instructions: Schedule TURBT, bilateral RTG, instillation on intravesical gemcitabine    I discussed the assessment and treatment plan with the patient.  The patient was provided an opportunity to ask questions and all were answered. The patient agreed with the plan and demonstrated an understanding of the instructions.   The patient was advised to call back or seek an in-person evaluation if the symptoms worsen or if the condition fails to improve as anticipated.  I provided 7 minutes of non-face-to-face time during this encounter.   Hollice Espy, MD

## 2018-12-19 NOTE — H&P (View-Only) (Signed)
Virtual Visit via Telephone Note  I connected with Cory Hess on 12/19/18 at  9:15 AM EDT by telephone and verified that I am speaking with the correct person using two identifiers.  Location: Patient: Home Provider: Office   I discussed the limitations, risks, security and privacy concerns of performing an evaluation and management service by telephone and the availability of in person appointments. I also discussed with the patient that there may be a patient responsible charge related to this service. The patient expressed understanding and agreed to proceed.   History of Present Illness: 73 year old male with a history of hematuria who underwent hematuria evaluation with Dr. 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  He scheduled for TURBT and instillation of intravesical gemcitabine early next week.  He requested a virtual telephone visit with me today as he has multiple questions.  He has a history of microscopic hematuria and underwent evaluation.  Previous CT scan with contrast with delayed to the level of the mid ureter indicate no GU pathology.  He underwent cystoscopy with one of my partners, Dr. Junious Silk indicating a small approximately 1 cm papillary lesion highly concerning for malignancy.  He has been scheduled for surgery but has multiple questions and requested this appointment.  Primarily, he is concerned about the procedure itself, postoperative care, and possible complications.   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  Follow Up Instructions: Schedule TURBT, bilateral RTG, instillation on intravesical gemcitabine    I discussed the assessment and treatment plan with the patient.  The patient was provided an opportunity to ask questions and all were answered. The patient agreed with the plan and demonstrated an understanding of the instructions.   The patient was advised to call back or seek an in-person evaluation if the symptoms worsen or if the condition fails to improve as anticipated.  I provided 7 minutes of non-face-to-face time during this encounter.   Hollice Espy, MD

## 2018-12-19 NOTE — Telephone Encounter (Signed)
Notify patient his urine culture was positive and an antibiotic sent to their pharmacy. He needs to take the abx as Dr. Erlene Quan will be doing the bladder procedure next month. Send in nitrofurantoin 100 mg, one po BID x 7 days, #14, no refills to his pharmacy -

## 2018-12-20 ENCOUNTER — Other Ambulatory Visit: Payer: Self-pay | Admitting: Radiology

## 2018-12-21 ENCOUNTER — Other Ambulatory Visit: Payer: Self-pay

## 2018-12-21 ENCOUNTER — Encounter
Admission: RE | Admit: 2018-12-21 | Discharge: 2018-12-21 | Disposition: A | Discharge status: Home / Self Care | Payer: Medicare Other | Source: Ambulatory Visit | Attending: Urology | Admitting: Urology

## 2018-12-21 ENCOUNTER — Encounter: Payer: Self-pay | Admitting: *Deleted

## 2018-12-21 DIAGNOSIS — Z20828 Contact with and (suspected) exposure to other viral communicable diseases: Secondary | ICD-10-CM | POA: Insufficient documentation

## 2018-12-21 DIAGNOSIS — Z01812 Encounter for preprocedural laboratory examination: Secondary | ICD-10-CM | POA: Diagnosis not present

## 2018-12-21 HISTORY — DX: Malignant (primary) neoplasm, unspecified: C80.1

## 2018-12-21 LAB — CBC
HCT: 42.9 % (ref 39.0–52.0)
Hemoglobin: 14.3 g/dL (ref 13.0–17.0)
MCH: 30.4 pg (ref 26.0–34.0)
MCHC: 33.3 g/dL (ref 30.0–36.0)
MCV: 91.1 fL (ref 80.0–100.0)
Platelets: 160 10*3/uL (ref 150–400)
RBC: 4.71 MIL/uL (ref 4.22–5.81)
RDW: 13.1 % (ref 11.5–15.5)
WBC: 7.9 10*3/uL (ref 4.0–10.5)
nRBC: 0 % (ref 0.0–0.2)

## 2018-12-21 LAB — BASIC METABOLIC PANEL
Anion gap: 8 (ref 5–15)
BUN: 10 mg/dL (ref 8–23)
CO2: 24 mmol/L (ref 22–32)
Calcium: 9.1 mg/dL (ref 8.9–10.3)
Chloride: 108 mmol/L (ref 98–111)
Creatinine, Ser: 1.05 mg/dL (ref 0.61–1.24)
GFR calc Af Amer: >60 mL/min (ref >60)
GFR calc non Af Amer: >60 mL/min (ref >60)
Glucose, Bld: 109 mg/dL — ABNORMAL HIGH (ref 70–99)
Potassium: 4.1 mmol/L (ref 3.5–5.1)
Sodium: 140 mmol/L (ref 135–145)

## 2018-12-21 LAB — SARS CORONAVIRUS 2 (TAT 6-24 HRS): SARS Coronavirus 2: NEGATIVE

## 2018-12-21 NOTE — Patient Instructions (Signed)
INSTRUCTIONS FOR SURGERY     Your surgery is scheduled for:   Monday, November 2ND, 2020     To find out your arrival time for the day of surgery,          please call [phone] between 1 pm and 3 pm on :   Friday, October 30TH     When you arrive for surgery, report to the [facility].       Do NOT stop on the first floor to register.    REMEMBER: Instructions that are not followed completely may result in serious medical risk,  up to and including death, or upon the discretion of your surgeon and anesthesiologist,            your surgery may need to be rescheduled.  __X__ 1. Do not eat food after midnight the night before your procedure.                    No gum, candy, lozenger, tic tacs, tums or hard candies.                  ABSOLUTELY NOTHING SOLID IN YOUR MOUTH AFTER MIDNIGHT                    You may drink unlimited clear liquids up to 2 hours before you are scheduled to arrive for surgery.                   Do not drink anything within those 2 hours unless you need to take medicine, then take the                   smallest amount you need.  Clear liquids include:  water, apple juice without pulp,                   any flavor Gatorade, Black coffee, black tea.  Sugar may be added but no dairy/ honey /lemon.                        Broth and jello is not considered a clear liquid.  __x__  2. On the morning of surgery, please brush your teeth with toothpaste and water. You may rinse with                  mouthwash if you wish but DO NOT SWALLOW TOOTHPASTE OR MOUTHWASH  __X___3. NO alcohol for 24 hours before or after surgery.  __x___ 4.  Do NOT smoke or use e-cigarettes for 24 HOURS PRIOR TO SURGERY.                      DO NOT Use any chewable tobacco products for at least 6 hours prior to surgery.  __x___ 5. If you start any new medication after this appointment and prior to surgery, please              Bring it with you on the day of surgery.  ___x__ 6. Notify your doctor if there is any change in your medical condition, such  as fever, infection, vomitting,                   Diarrhea or any open sores.  __x___ 7.  USE the CHG SOAP as instructed, the night before surgery and the day of surgery.                   Once you have washed with this soap, do NOT use any of the following: Powders, perfumes                    or lotions. Please do not wear make up, hairpins, clips or nail polish. You ______   wear deodorant.                   Men may shave their face and neck.  Women need to shave 48 hours prior to surgery.                   DO NOT wear ANY jewelry on the day of surgery. If there are rings that are too tight to                    remove easily, please address this prior to the surgery day. Piercings need to be removed.                                                                     NO METAL ON YOUR BODY.                    Do NOT bring any valuables.  If you came to Pre-Admit testing then you will not need license,                     insurance card or credit card.  If you will be staying overnight, please either leave your things in                     the car or have your family be responsible for these items.                     [facility] IS NOT RESPONSIBLE FOR BELONGINGS OR VALUABLES.  ___X__ 8. DO NOT wear contact lenses on surgery day.  You may not have dentures,                     Hearing aides, contacts or glasses in the operating room. These items can be                    Placed in the Recovery Room to receive immediately after surgery.  __x___ 9. IF YOU ARE SCHEDULED TO GO HOME ON THE SAME DAY, YOU MUST                   Have someone to drive you home and to stay with you  for the first 24 hours.                    Have an arrangement prior to arriving on surgery day.  ___x__ 10. Take the following medications on the morning  of surgery with a sip of  water:                              1.  COREG / CARVEDILOL                     2.   FLOMAX                     3.   MACROBID                     4.                     5.                     6.  _____ 11.  Follow any instructions provided to you by your surgeon.                        Such as enema, clear liquid bowel prep  __X__  12. STOP  ASPIRIN AS OF: LAST MONDAY                       THIS INCLUDES BC POWDERS / GOODIES POWDER  __x___ 13. STOP Anti-inflammatories as of:  TODAY                      This includes IBUPROFEN / MOTRIN / ADVIL / ALEVE/ NAPROXYN                    YOU MAY TAKE TYLENOL ANY TIME PRIOR TO SURGERY.   ______17.  Continue to take the following medications but do not take on the morning of surgery:                          LOSARTAN AND MULTIVITAMINS  ______18. If staying overnight, please have appropriate shoes to wear to be able to walk around the unit.                   Wear clean and comfortable clothing to the hospital.  CONTINUE TAKING ALL EVENING MEDICINES AS USUAL

## 2018-12-21 NOTE — Patient Instructions (Signed)
INSTRUCTIONS FOR SURGERY     Your surgery is scheduled for:   Monday, December 25, 2018     To find out your arrival time for the day of surgery,          please call [phone] between 1 pm and 3 pm on :   December 22, 2018     When you arrive for surgery, report to the [facility].       Do NOT stop on the first floor to register.    REMEMBER: Instructions that are not followed completely may result in serious medical risk,  up to and including death, or upon the discretion of your surgeon and anesthesiologist,            your surgery may need to be rescheduled.  __X__ 1. Do not eat food after midnight the night before your procedure.                    No gum, candy, lozenger, tic tacs, tums or hard candies.                  ABSOLUTELY NOTHING SOLID IN YOUR MOUTH AFTER MIDNIGHT                    You may drink unlimited clear liquids up to 2 hours before you are scheduled to arrive for surgery.                   Do not drink anything within those 2 hours unless you need to take medicine, then take the                   smallest amount you need.  Clear liquids include:  water, apple juice without pulp,                   any flavor Gatorade, Black coffee, black tea.  Sugar may be added but no dairy/ honey /lemon.                        Broth and jello is not considered a clear liquid.  __x__  2. On the morning of surgery, please brush your teeth with toothpaste and water. You may rinse with                  mouthwash if you wish but DO NOT SWALLOW TOOTHPASTE OR MOUTHWASH  __X___3. NO alcohol for 24 hours before or after surgery.  __x___ 4.  Do NOT smoke or use e-cigarettes for 24 HOURS PRIOR TO SURGERY.                      DO NOT Use any chewable tobacco products for at least 6 hours prior to surgery.  __x___ 5. If you start any new medication after this appointment and prior to surgery, please        Bring it with you on the day of surgery.  ___x__ 6. Notify your doctor if there is any change in your medical condition, such as fever, infection, vomitting,  Diarrhea or any open sores.  __x___ 7.  USE the CHG SOAP as instructed, the night before surgery and the day of surgery.                   Once you have washed with this soap, do NOT use any of the following: Powders, perfumes                    or lotions. Please do not wear make up, hairpins, clips or nail polish. You MAY wear deodorant.                   Men may shave their face and neck.  Women need to shave 48 hours prior to surgery.                   DO NOT wear ANY jewelry on the day of surgery. If there are rings that are too tight to                    remove easily, please address this prior to the surgery day. Piercings need to be removed.                                                                     NO METAL ON YOUR BODY.                    Do NOT bring any valuables.  If you came to Pre-Admit testing then you will not need license,                     insurance card or credit card.  If you will be staying overnight, please either leave your things in                     the car or have your family be responsible for these items.                     [facility] IS NOT RESPONSIBLE FOR BELONGINGS OR VALUABLES.  ___X__ 8. DO NOT wear contact lenses on surgery day.  You may not have dentures,                     Hearing aides, contacts or glasses in the operating room. These items can be                    Placed in the Recovery Room to receive immediately after surgery.  __x___ 9. IF YOU ARE SCHEDULED TO GO HOME ON THE SAME DAY, YOU MUST                   Have someone to drive you home and to stay with you  for the first 24 hours.                    Have an arrangement prior to arriving on surgery day.  ___x__ 10. Take the following medications on the morning of surgery with a sip of water:  1.  COREG / CARVEDILOL                     2.  FLOMAX                     3.  MACROBID                     4.                     5.                     6.  _____ 11.  Follow any instructions provided to you by your surgeon.                        Such as enema, clear liquid bowel prep  __X__  12. STOP C ASPIRIN AS OF: LAST MONDAY                       THIS INCLUDES BC POWDERS / GOODIES POWDER  __x___ 13. STOP Anti-inflammatories as of: TODAY                      This includes IBUPROFEN / MOTRIN / ADVIL / ALEVE/ NAPROXYN                    YOU MAY TAKE TYLENOL ANY TIME PRIOR TO SURGERY.   __X____17.  Continue to take the following medications but do not take on the morning of surgery:                           LOSARTAN AND MULTIVITAMINS  ______18. If staying overnight, please have appropriate shoes to wear to be able to walk around the unit.                   Wear clean and comfortable clothing to the hospital.  CONTINUE TAKING THE LIPITOR AT NIGHT, AS USUAL.

## 2018-12-25 ENCOUNTER — Ambulatory Visit: Payer: Medicare Other | Admitting: Anesthesiology

## 2018-12-25 ENCOUNTER — Encounter
Admission: RE | Disposition: A | Discharge status: Home / Self Care | Payer: Self-pay | Source: Home / Self Care | Attending: Urology

## 2018-12-25 ENCOUNTER — Ambulatory Visit
Admission: RE | Admit: 2018-12-25 | Discharge: 2018-12-25 | Disposition: A | Discharge status: Home / Self Care | Payer: Medicare Other | Attending: Urology | Admitting: Urology

## 2018-12-25 ENCOUNTER — Other Ambulatory Visit: Payer: Self-pay

## 2018-12-25 ENCOUNTER — Encounter: Payer: Self-pay | Admitting: *Deleted

## 2018-12-25 ENCOUNTER — Ambulatory Visit: Payer: Medicare Other

## 2018-12-25 DIAGNOSIS — I255 Ischemic cardiomyopathy: Secondary | ICD-10-CM | POA: Insufficient documentation

## 2018-12-25 DIAGNOSIS — Z955 Presence of coronary angioplasty implant and graft: Secondary | ICD-10-CM | POA: Diagnosis not present

## 2018-12-25 DIAGNOSIS — M1990 Unspecified osteoarthritis, unspecified site: Secondary | ICD-10-CM | POA: Diagnosis not present

## 2018-12-25 DIAGNOSIS — I509 Heart failure, unspecified: Secondary | ICD-10-CM | POA: Diagnosis not present

## 2018-12-25 DIAGNOSIS — I2510 Atherosclerotic heart disease of native coronary artery without angina pectoris: Secondary | ICD-10-CM | POA: Insufficient documentation

## 2018-12-25 DIAGNOSIS — Z9841 Cataract extraction status, right eye: Secondary | ICD-10-CM | POA: Insufficient documentation

## 2018-12-25 DIAGNOSIS — D494 Neoplasm of unspecified behavior of bladder: Secondary | ICD-10-CM | POA: Diagnosis not present

## 2018-12-25 DIAGNOSIS — I252 Old myocardial infarction: Secondary | ICD-10-CM | POA: Diagnosis not present

## 2018-12-25 DIAGNOSIS — D414 Neoplasm of uncertain behavior of bladder: Secondary | ICD-10-CM

## 2018-12-25 DIAGNOSIS — E785 Hyperlipidemia, unspecified: Secondary | ICD-10-CM | POA: Diagnosis not present

## 2018-12-25 DIAGNOSIS — Z87891 Personal history of nicotine dependence: Secondary | ICD-10-CM | POA: Diagnosis not present

## 2018-12-25 DIAGNOSIS — Z9842 Cataract extraction status, left eye: Secondary | ICD-10-CM | POA: Diagnosis not present

## 2018-12-25 DIAGNOSIS — Z951 Presence of aortocoronary bypass graft: Secondary | ICD-10-CM | POA: Diagnosis not present

## 2018-12-25 DIAGNOSIS — I10 Essential (primary) hypertension: Secondary | ICD-10-CM | POA: Diagnosis not present

## 2018-12-25 DIAGNOSIS — I110 Hypertensive heart disease with heart failure: Secondary | ICD-10-CM | POA: Diagnosis not present

## 2018-12-25 DIAGNOSIS — C679 Malignant neoplasm of bladder, unspecified: Secondary | ICD-10-CM | POA: Diagnosis not present

## 2018-12-25 DIAGNOSIS — C672 Malignant neoplasm of lateral wall of bladder: Secondary | ICD-10-CM | POA: Insufficient documentation

## 2018-12-25 HISTORY — PX: CYSTOSCOPY W/ RETROGRADES: SHX1426

## 2018-12-25 HISTORY — PX: TRANSURETHRAL RESECTION OF BLADDER TUMOR WITH MITOMYCIN-C: SHX6459

## 2018-12-25 SURGERY — TRANSURETHRAL RESECTION OF BLADDER TUMOR WITH MITOMYCIN-C
Anesthesia: General | Site: Ureter

## 2018-12-25 MED ORDER — CEFAZOLIN SODIUM-DEXTROSE 2-4 GM/100ML-% IV SOLN
2.0000 g | INTRAVENOUS | Status: AC
  Administered 2018-12-25: 2 g via INTRAVENOUS

## 2018-12-25 MED ORDER — ONDANSETRON HCL 4 MG/2ML IJ SOLN
INTRAMUSCULAR | Status: DC | PRN
  Administered 2018-12-25: 4 mg via INTRAVENOUS

## 2018-12-25 MED ORDER — OXYCODONE HCL 5 MG/5ML PO SOLN: 5.0000 mg | Freq: Once | ORAL | Status: DC | PRN

## 2018-12-25 MED ORDER — FAMOTIDINE 20 MG PO TABS
ORAL_TABLET | ORAL | Status: AC
  Filled 2018-12-25: qty 1

## 2018-12-25 MED ORDER — IOHEXOL 180 MG/ML  SOLN
INTRAMUSCULAR | Status: DC | PRN
  Administered 2018-12-25: 40 mL

## 2018-12-25 MED ORDER — LIDOCAINE HCL (PF) 2 % IJ SOLN
INTRAMUSCULAR | Status: AC
  Filled 2018-12-25: qty 10

## 2018-12-25 MED ORDER — BELLADONNA ALKALOIDS-OPIUM 16.2-60 MG RE SUPP
RECTAL | Status: AC
  Administered 2018-12-25: 1 via RECTAL
  Filled 2018-12-25: qty 1

## 2018-12-25 MED ORDER — FENTANYL CITRATE (PF) 100 MCG/2ML IJ SOLN
25.0000 ug | INTRAMUSCULAR | Status: DC | PRN
  Administered 2018-12-25: 13:00:00 25 ug via INTRAVENOUS

## 2018-12-25 MED ORDER — EPHEDRINE SULFATE 50 MG/ML IJ SOLN
INTRAMUSCULAR | Status: DC | PRN
  Administered 2018-12-25: 10 mg via INTRAVENOUS

## 2018-12-25 MED ORDER — GEMCITABINE CHEMO FOR BLADDER INSTILLATION 2000 MG
INTRAVENOUS | Status: DC | PRN
  Administered 2018-12-25: 2000 mg via INTRAVESICAL

## 2018-12-25 MED ORDER — PHENYLEPHRINE HCL (PRESSORS) 10 MG/ML IV SOLN
INTRAVENOUS | Status: DC | PRN
  Administered 2018-12-25: 100 ug via INTRAVENOUS

## 2018-12-25 MED ORDER — FENTANYL CITRATE (PF) 100 MCG/2ML IJ SOLN
INTRAMUSCULAR | Status: DC | PRN
  Administered 2018-12-25: 100 ug via INTRAVENOUS

## 2018-12-25 MED ORDER — EPHEDRINE SULFATE 50 MG/ML IJ SOLN
INTRAMUSCULAR | Status: AC
  Filled 2018-12-25: qty 1

## 2018-12-25 MED ORDER — OXYCODONE HCL 5 MG PO TABS: 5.0000 mg | ORAL_TABLET | Freq: Once | ORAL | Status: DC | PRN

## 2018-12-25 MED ORDER — CEFAZOLIN SODIUM-DEXTROSE 2-4 GM/100ML-% IV SOLN
INTRAVENOUS | Status: AC
  Filled 2018-12-25: qty 100

## 2018-12-25 MED ORDER — PROPOFOL 10 MG/ML IV BOLUS
INTRAVENOUS | Status: DC | PRN
  Administered 2018-12-25: 150 mg via INTRAVENOUS

## 2018-12-25 MED ORDER — BELLADONNA ALKALOIDS-OPIUM 16.2-60 MG RE SUPP
1.0000 | Freq: Every day | RECTAL | Status: DC
  Administered 2018-12-25: 13:00:00 1 via RECTAL

## 2018-12-25 MED ORDER — ATROPINE SULFATE 0.4 MG/ML IJ SOLN
INTRAMUSCULAR | Status: AC
  Filled 2018-12-25: qty 1

## 2018-12-25 MED ORDER — LIDOCAINE HCL (CARDIAC) PF 100 MG/5ML IV SOSY
PREFILLED_SYRINGE | INTRAVENOUS | Status: DC | PRN
  Administered 2018-12-25: 80 mg via INTRAVENOUS

## 2018-12-25 MED ORDER — FAMOTIDINE 20 MG PO TABS
20.0000 mg | ORAL_TABLET | Freq: Once | ORAL | Status: AC
  Administered 2018-12-25: 08:00:00 20 mg via ORAL

## 2018-12-25 MED ORDER — LACTATED RINGERS IV SOLN
INTRAVENOUS | Status: DC
  Administered 2018-12-25 (×2): via INTRAVENOUS

## 2018-12-25 MED ORDER — FENTANYL CITRATE (PF) 100 MCG/2ML IJ SOLN
INTRAMUSCULAR | Status: AC
  Administered 2018-12-25: 25 ug via INTRAVENOUS
  Filled 2018-12-25: qty 2

## 2018-12-25 MED ORDER — FENTANYL CITRATE (PF) 100 MCG/2ML IJ SOLN
INTRAMUSCULAR | Status: AC
  Filled 2018-12-25: qty 2

## 2018-12-25 MED ORDER — PROPOFOL 10 MG/ML IV BOLUS
INTRAVENOUS | Status: AC
  Filled 2018-12-25: qty 20

## 2018-12-25 SURGICAL SUPPLY — 31 items
BAG DRAIN CYSTO-URO LG1000N (MISCELLANEOUS) ×4 IMPLANT
BAG URINE DRAIN 2000ML AR STRL (UROLOGICAL SUPPLIES) ×2 IMPLANT
BRUSH SCRUB EZ  4% CHG (MISCELLANEOUS) ×2
BRUSH SCRUB EZ 4% CHG (MISCELLANEOUS) ×2 IMPLANT
CATH FOLEY 2WAY  5CC 16FR (CATHETERS) ×2
CATH URETL 5X70 OPEN END (CATHETERS) ×4 IMPLANT
CATH URTH 16FR FL 2W BLN LF (CATHETERS) ×2 IMPLANT
CRADLE LAMINECT ARM (MISCELLANEOUS) ×4 IMPLANT
DRAPE UTILITY 15X26 TOWEL STRL (DRAPES) ×4 IMPLANT
DRSG TELFA 4X3 1S NADH ST (GAUZE/BANDAGES/DRESSINGS) ×4 IMPLANT
ELECT LOOP 22F BIPOLAR SML (ELECTROSURGICAL)
ELECT REM PT RETURN 9FT ADLT (ELECTROSURGICAL) ×4
ELECTRODE LOOP 22F BIPOLAR SML (ELECTROSURGICAL) IMPLANT
ELECTRODE REM PT RTRN 9FT ADLT (ELECTROSURGICAL) IMPLANT
GLOVE BIO SURGEON STRL SZ 6.5 (GLOVE) ×3 IMPLANT
GLOVE BIO SURGEONS STRL SZ 6.5 (GLOVE) ×1
GOWN STRL REUS W/ TWL LRG LVL3 (GOWN DISPOSABLE) ×4 IMPLANT
GOWN STRL REUS W/TWL LRG LVL3 (GOWN DISPOSABLE) ×4
GUIDEWIRE STR DUAL SENSOR (WIRE) ×4 IMPLANT
KIT TURNOVER CYSTO (KITS) ×4 IMPLANT
LOOP CUT BIPOLAR 24F LRG (ELECTROSURGICAL) IMPLANT
NDL SAFETY ECLIPSE 18X1.5 (NEEDLE) ×2 IMPLANT
NEEDLE HYPO 18GX1.5 SHARP (NEEDLE)
PACK CYSTO AR (MISCELLANEOUS) ×4 IMPLANT
SET CYSTO W/LG BORE CLAMP LF (SET/KITS/TRAYS/PACK) ×4 IMPLANT
SET IRRIG Y TYPE TUR BLADDER L (SET/KITS/TRAYS/PACK) ×2 IMPLANT
SOL .9 NS 3000ML IRR  AL (IV SOLUTION) ×2
SOL .9 NS 3000ML IRR UROMATIC (IV SOLUTION) ×2 IMPLANT
SURGILUBE 2OZ TUBE FLIPTOP (MISCELLANEOUS) ×4 IMPLANT
SYRINGE IRR TOOMEY STRL 70CC (SYRINGE) ×4 IMPLANT
WATER STERILE IRR 1000ML POUR (IV SOLUTION) ×4 IMPLANT

## 2018-12-25 NOTE — Progress Notes (Signed)
Dr. Amie Critchley at bedside to evaluate patient/heart rhythm. Per MD, this rhythm appropriate; nothing else needed at this time. Will continue to evaluate.

## 2018-12-25 NOTE — Anesthesia Preprocedure Evaluation (Signed)
Anesthesia Evaluation  Patient identified by MRN, date of birth, ID band Patient awake    Reviewed: Allergy & Precautions, H&P , NPO status , Patient's Chart, lab work & pertinent test results  History of Anesthesia Complications Negative for: history of anesthetic complications  Airway Mallampati: [name]  TM Distance: >3 FB Neck ROM: limited    Dental  (+) Chipped, Poor Dentition, Missing, Edentulous Upper   Pulmonary neg shortness of breath, former smoker,           Cardiovascular Exercise Tolerance: Good hypertension, (-) angina+ CAD and + Past MI  (-) DOE      Neuro/Psych negative neurological ROS  negative psych ROS   GI/Hepatic negative GI ROS, Neg liver ROS, neg GERD  ,  Endo/Other  negative endocrine ROS  Renal/GU      Musculoskeletal  (+) Arthritis ,   Abdominal   Peds  Hematology negative hematology ROS (+)   Anesthesia Other Findings Past Medical History: No date: Arthritis     Comment:  neck No date: CAD (coronary artery disease)     Comment:  a. 08/2017 STEMI/Cath: LM 80ost/d, LAD 100p, LCX 95               ost/p, RCA 60p, 37m, RPDA 80ost; b. 08/2017 CABG x 3:               LIMA->LAD, VG->OM, VG->PDA. 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 08/2017 TEE: EF 30-35%, sept/ant/inf HK, apical AK.               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 No date: Hyperlipidemia LDL goal <70 No date: Ischemic cardiomyopathy     Comment:  a. 08/2017 TEE: EF 30-35%. 08/27/2017: Myocardial infarction ([place]) No date: Shingles     Comment:  2018 - right side No date: Vertigo     Comment:  several months ago  Past Surgical History: 07/18/2018: CATARACT EXTRACTION W/PHACO; Left     Comment:  Procedure: CATARACT EXTRACTION PHACO AND INTRAOCULAR               LENS PLACEMENT ([place])  LEFT;  Surgeon: Eulogio Bear,              MD;  Location: [place];  Service:               Ophthalmology;  Laterality: Left; 08/14/2018: CATARACT EXTRACTION W/PHACO; Right     Comment:  Procedure: CATARACT EXTRACTION PHACO AND INTRAOCULAR               LENS PLACEMENT ([facility])  RIGHT;  Surgeon: Eulogio Bear, MD;  Location: [place];  Service:               Ophthalmology;  Laterality: Right; 08/27/2017: CORONARY ARTERY BYPASS GRAFT; N/A     Comment:  Procedure: CORONARY ARTERY BYPASS GRAFTING (CABG) ON               PUMP USING LEFT INTERNAL MAMMARY ARTERY AND LEFT GREATER               SAPHENOUS VEIN VIA ENDOVEIN HARVEST;  Surgeon: Gaye Pollack, MD;  Location: [facility]  OR;  Service: Open Heart               Surgery;  Laterality: N/A; 08/27/2017: CORONARY/GRAFT ACUTE MI REVASCULARIZATION; N/A     Comment:  Procedure: Coronary/Graft Acute MI Revascularization;                Surgeon: Wellington Hampshire, MD;  Location: [place]               CV LAB;  Service: Cardiovascular;  Laterality: N/A; 1958: FRACTURE SURGERY; Right     Comment:  arm and left wrist compound fracture, no metal No date: HERNIA REPAIR; Right     Comment:  inguinial 08/27/2017: LEFT HEART CATH AND CORONARY ANGIOGRAPHY; N/A     Comment:  Procedure: LEFT HEART CATH AND CORONARY ANGIOGRAPHY;                Surgeon: Wellington Hampshire, MD;  Location: [place]               CV LAB;  Service: Cardiovascular;  Laterality: N/A;  BMI    Body Mass Index: 33.49 kg/m      Reproductive/Obstetrics negative OB ROS                             Anesthesia Physical Anesthesia Plan  ASA: [name]  Anesthesia Plan: General LMA   Post-op Pain Management:    Induction: Intravenous  PONV Risk Score and Plan: Dexamethasone, Ondansetron, Midazolam and Treatment may vary due to age or medical condition  Airway Management Planned: LMA  Additional Equipment:   Intra-op Plan:    Post-operative Plan: Extubation in OR  Informed Consent: I have reviewed the patients History and Physical, chart, labs and discussed the procedure including the risks, benefits and alternatives for the proposed anesthesia with the patient or authorized representative who has indicated his/her understanding and acceptance.     Dental Advisory Given  Plan Discussed with: Anesthesiologist, CRNA and Surgeon  Anesthesia Plan Comments: (Patient consented for risks of anesthesia including but not limited to:  - adverse reactions to medications - damage to teeth, lips or other oral mucosa - sore throat or hoarseness - Damage to heart, brain, lungs or loss of life  Patient voiced understanding.)        Anesthesia Quick Evaluation

## 2018-12-25 NOTE — Anesthesia Postprocedure Evaluation (Signed)
Anesthesia Post Note  Patient: Cory Hess, Cory Hess  Procedure(s) Performed: TRANSURETHRAL RESECTION OF BLADDER TUMOR WITH Gemcitabine (N/A Bladder) CYSTOSCOPY WITH RETROGRADE PYELOGRAM (Bilateral Ureter)  Patient location during evaluation: PACU Anesthesia Type: General Level of consciousness: awake and alert Pain management: pain level controlled Vital Signs Assessment: post-procedure vital signs reviewed and stable Respiratory status: spontaneous breathing, nonlabored ventilation, respiratory function stable and patient connected to nasal cannula oxygen Cardiovascular status: blood pressure returned to baseline and stable Postop Assessment: no apparent nausea or vomiting Anesthetic complications: no     Last Vitals:  Vitals:   12/25/18 1254 12/25/18 1306  BP: (!) 160/99 (!) 161/94  Pulse:  93  Resp:  17  Temp:  36.5 C  SpO2:  96%    Last Pain:  Vitals:   12/25/18 1306  TempSrc:   PainSc: 2                  Cory Hess

## 2018-12-25 NOTE — Discharge Instructions (Addendum)
Transurethral Resection of Bladder Tumor (TURBT) or Bladder Biopsy ° ° °Definition: ° Transurethral Resection of the Bladder Tumor is a surgical procedure used to diagnose and remove tumors within the bladder. TURBT is the most common treatment for early stage bladder cancer. ° °General instructions: °   ° Your recent bladder surgery requires very little post hospital care but some definite precautions. ° °Despite the fact that no skin incisions were used, the area around the bladder incisions are raw and covered with scabs to promote healing and prevent bleeding. Certain precautions are needed to insure that the scabs are not disturbed over the next 2-4 weeks while the healing proceeds. ° °Because the raw surface inside your bladder and the irritating effects of urine you may expect frequency of urination and/or urgency (a stronger desire to urinate) and perhaps even getting up at night more often. This will usually resolve or improve slowly over the healing period. You may see some blood in your urine over the first 6 weeks. Do not be alarmed, even if the urine was clear for a while. Get off your feet and drink lots of fluids until clearing occurs. If you start to pass clots or don't improve call us. ° °Diet: ° °You may return to your normal diet immediately. Because of the raw surface of your bladder, alcohol, spicy foods, foods high in acid and drinks with caffeine may cause irritation or frequency and should be used in moderation. To keep your urine flowing freely and avoid constipation, drink plenty of fluids during the day (8-10 glasses). Tip: Avoid cranberry juice because it is very acidic. ° °Activity: ° °Your physical activity doesn't need to be restricted. However, if you are very active, you may see some blood in the urine. We suggest that you reduce your activity under the circumstances until the bleeding has stopped. ° °Bowels: ° °It is important to keep your bowels regular during the postoperative  period. Straining with bowel movements can cause bleeding. A bowel movement every other day is reasonable. Use a mild laxative if needed, such as milk of magnesia 2-3 tablespoons, or 2 Dulcolax tablets. Call if you continue to have problems. If you had been taking narcotics for pain, before, during or after your surgery, you may be constipated. Take a laxative if necessary. ° ° ° °Medication: ° °You should resume your pre-surgery medications unless told not to. In addition you may be given an antibiotic to prevent or treat infection. Antibiotics are not always necessary. All medication should be taken as prescribed until the bottles are finished unless you are having an unusual reaction to one of the drugs. ° ° °[facility] °[place], [place] [address] °[phone] ° ° ° °AMBULATORY SURGERY  °DISCHARGE INSTRUCTIONS ° ° °1) The drugs that you were given will stay in your system until tomorrow so for the next 24 hours you should not: ° °A) Drive an automobile °B) Make any legal decisions °C) Drink any alcoholic beverage ° ° °2) You may resume regular meals tomorrow.  Today it is better to start with liquids and gradually work up to solid foods. ° °You may eat anything you prefer, but it is better to start with liquids, then soup and crackers, and gradually work up to solid foods. ° ° °3) Please notify your doctor immediately if you have any unusual bleeding, trouble breathing, redness and pain at the surgery site, drainage, fever, or pain not relieved by medication. ° ° ° °4) Additional Instructions: ° ° ° ° ° ° ° °  Please contact your physician with any problems or Same Day Surgery at [phone], Monday through Friday 6 am to 4 pm, or [facility] Main number at [phone]. ° °

## 2018-12-25 NOTE — Interval H&P Note (Signed)
H&P reviewed, up-to-date.  All additional questions answered.  Plan for TURBT, bilateral retrogrades and instillation of intravesical gemcitabine.  RRR.  CTA B.

## 2018-12-25 NOTE — Op Note (Signed)
Date of procedure: 12/25/18  Preoperative diagnosis:  1. Bladder tumor  Postoperative diagnosis:  1. Same as above  Procedure: 1. Cystoscopy 2. Bilateral retrograde pyelogram 3. TURBT, small 4. Instillation of intravesical chemotherapy  Surgeon: Hollice Espy, MD  Anesthesia: General  Complications: None  Intraoperative findings: Small approximately 1 cm papillary tumor, low-grade appearing on a broad stalk on the left lateral bladder wall.  Normal bilateral retrograde pyelogram.  EBL: Minimal  Specimens: Bladder tumor, posterior wall bladder erythema  Drains: 16 French Foley catheter  Indication: Cory Hess is a 73 y.o. patient with bladder lesion concerning for bladder cancer.  After reviewing the management options for treatment, he elected to proceed with the above surgical procedure(s). We have discussed the potential benefits and risks of the procedure, side effects of the proposed treatment, the likelihood of the patient achieving the goals of the procedure, and any potential problems that might occur during the procedure or recuperation. Informed consent has been obtained.  Description of procedure:  The patient was taken to the operating room and general anesthesia was induced.  The patient was placed in the dorsal lithotomy position, prepped and draped in the usual sterile fashion, and preoperative antibiotics were administered. A preoperative time-out was performed.   A 21 French scope was advanced per urethra to the bladder.  Bladder is carefully dissected noticed to have a small area of erythema the posterior bladder wall,?  Scope trauma.  This is nonspecific.  There is a papillary tumor on a relatively broad base approximately 1 cm diameter on the left lateral bladder wall, the remainder of the bladder was unremarkable.  It was moderately trabeculated with prostamegaly.  Attention was first turned to the right ureter orifice which was cannulated using a 5 [place]  open-ended ureteral catheter.  Gentle retrograde pyelogram was performed which showed no hydroureteronephrosis or filling defects.  Attention was then turned to the left side.  Had some difficulty cannulating the UO on the side but ultimately was successful with a wire.  Retrograde on the side was also unremarkable without hydroureteronephrosis or filling defects.  Next, cold cup biopsy forcep graspers were used to resect the tumor and a piece wise fashion from the left UO to avoid cautery artifact.  This is passed off the field as left lateral bladder wall tumor.  The biopsy is down to muscularis which is easily seen.  The focal area of posterior bladder wall erythema was also biopsied as a precaution and labeled as bladder biopsy.  The stasis was then achieved using Bovie electrocautery.  Hemostasis was excellent.  A 60 French Foley catheter was then placed in the balloon was filled with 10 cc of sterile water.  The patient was then cleaned and dried, repositioned in supine position, versus myesthesia taken the PACU in stable condition.  Plan patient was taken to the PACU.  2000 mg of intravesical gemcitabine was instilled to the bladder allowed to dwell for 1 hour.  Was well-tolerated.  After an hour, the catheter was drained and removed.  Plan: I will call the patient with his pathology results as I believe feels like be low-grade and superficial.  We will follow up with surveillance cystoscopy.  Hollice Espy, M.D.

## 2018-12-25 NOTE — Anesthesia Procedure Notes (Signed)
Procedure Name: LMA Insertion Date/Time: 12/25/2018 11:40 AM Performed by: Allean Found, CRNA Pre-anesthesia Checklist: Patient identified, Patient being monitored, Timeout performed, Emergency Drugs available and Suction available Patient Re-evaluated:Patient Re-evaluated prior to induction Oxygen Delivery Method: Circle system utilized Preoxygenation: Pre-oxygenation with 100% oxygen Induction Type: IV induction Ventilation: Mask ventilation without difficulty LMA: LMA inserted LMA Size: 5.0 Tube type: Oral Number of attempts: 1 Placement Confirmation: positive ETCO2 and breath sounds checked- equal and bilateral Tube secured with: Tape Dental Injury: Teeth and Oropharynx as per pre-operative assessment

## 2018-12-25 NOTE — Anesthesia Post-op Follow-up Note (Signed)
Anesthesia QCDR form completed.        

## 2018-12-25 NOTE — Transfer of Care (Signed)
Immediate Anesthesia Transfer of Care Note  Patient: Cory Hess  Procedure(s) Performed: TRANSURETHRAL RESECTION OF BLADDER TUMOR WITH Gemcitabine (N/A Bladder) CYSTOSCOPY WITH RETROGRADE PYELOGRAM (Bilateral Ureter)  Patient Location: PACU  Anesthesia Type:General  Level of Consciousness: sedated  Airway & Oxygen Therapy: Patient Spontanous Breathing and Patient connected to face mask oxygen  Post-op Assessment: Report given to RN and Post -op Vital signs reviewed and stable  Post vital signs: Reviewed and stable  Last Vitals:  Vitals Value Taken Time  BP 147/86 12/25/18 1209  Temp 36.3 C 12/25/18 1210  Pulse 61 12/25/18 1211  Resp 16 12/25/18 1211  SpO2 99 % 12/25/18 1211  Vitals shown include unvalidated device data.  Last Pain:  Vitals:   12/25/18 0815  TempSrc: Tympanic  PainSc: 0-No pain      Patients Stated Pain Goal: 0 ([id number])  Complications: No apparent anesthesia complications

## 2018-12-26 ENCOUNTER — Encounter: Payer: Self-pay | Admitting: Urology

## 2018-12-26 LAB — SURGICAL PATHOLOGY

## 2019-01-10 ENCOUNTER — Ambulatory Visit: Payer: Medicare Other | Admitting: Urology

## 2019-03-27 ENCOUNTER — Other Ambulatory Visit: Payer: Self-pay | Admitting: *Deleted

## 2019-03-27 MED ORDER — LOSARTAN POTASSIUM 50 MG PO TABS: 50.0000 mg | ORAL_TABLET | Freq: Every day | ORAL | 0 refills | Status: DC

## 2019-03-28 ENCOUNTER — Other Ambulatory Visit: Payer: Self-pay | Admitting: Cardiovascular Disease

## 2019-03-28 NOTE — Telephone Encounter (Signed)
Left voicemail message requesting to have patient call back to schedule office visit.

## 2019-03-28 NOTE — Telephone Encounter (Signed)
*  STAT* If patient is at the pharmacy, call can be transferred to refill team.   1. Which medications need to be refilled? (please list name of each medication and dose if known) Coreg 12.5 mg bid, losartan 50 mg  2. Which pharmacy/location (including street and city if local pharmacy) is medication to be sent to? [facility] on [address]  3. Do they need a 30 day or 90 day supply? [place]

## 2019-03-28 NOTE — Telephone Encounter (Signed)
Please schedule overdue F/U appointment. Thank you! ?

## 2019-04-02 MED ORDER — LOSARTAN POTASSIUM 50 MG PO TABS: 50.0000 mg | ORAL_TABLET | Freq: Every day | ORAL | 0 refills | Status: DC

## 2019-04-02 MED ORDER — CARVEDILOL 12.5 MG PO TABS: 12.5000 mg | ORAL_TABLET | Freq: Two times a day (BID) | ORAL | 0 refills | Status: DC

## 2019-04-02 NOTE — Telephone Encounter (Signed)
Patient schedule appointment on 04/10/2018.   Requested Prescriptions   Signed Prescriptions Disp Refills   carvedilol (COREG) 12.5 MG tablet 180 tablet 0    Sig: Take 1 tablet (12.5 mg total) by mouth 2 (two) times daily with a meal.    Authorizing Provider: Murray Hodgkins RONALD    Ordering User: NEWCOMER MCCLAIN, Mayce Noyes L   losartan (COZAAR) 50 MG tablet 90 tablet 0    Sig: Take 1 tablet (50 mg total) by mouth daily.    Authorizing Provider: Theora Gianotti    Ordering User: Raelene Bott, Stanislav Gervase L

## 2019-04-11 ENCOUNTER — Ambulatory Visit (INDEPENDENT_AMBULATORY_CARE_PROVIDER_SITE_OTHER): Payer: Medicare Other | Admitting: Nurse Practitioner

## 2019-04-11 ENCOUNTER — Other Ambulatory Visit: Payer: Self-pay

## 2019-04-11 ENCOUNTER — Encounter: Payer: Self-pay | Admitting: Nurse Practitioner

## 2019-04-11 VITALS — BP 114/62 | HR 74 | Ht 72.0 in | Wt 254.8 lb

## 2019-04-11 DIAGNOSIS — I255 Ischemic cardiomyopathy: Secondary | ICD-10-CM

## 2019-04-11 DIAGNOSIS — I10 Essential (primary) hypertension: Secondary | ICD-10-CM | POA: Diagnosis not present

## 2019-04-11 DIAGNOSIS — I2510 Atherosclerotic heart disease of native coronary artery without angina pectoris: Secondary | ICD-10-CM

## 2019-04-11 DIAGNOSIS — E785 Hyperlipidemia, unspecified: Secondary | ICD-10-CM | POA: Diagnosis not present

## 2019-04-11 DIAGNOSIS — I5020 Unspecified systolic (congestive) heart failure: Secondary | ICD-10-CM

## 2019-04-11 MED ORDER — NITROGLYCERIN 0.4 MG SL SUBL: 0.4000 mg | SUBLINGUAL_TABLET | SUBLINGUAL | 2 refills | Status: DC | PRN

## 2019-04-11 NOTE — Progress Notes (Signed)
Office Visit    Patient Name: Cory Hess Date of Encounter: 04/11/2019  Primary Care Provider:  Patient, No Pcp Per Primary Cardiologist:  Nelva Bush, MD  Chief Complaint    74 y/o ? with a history of CAD status post CABG x3 in July 2019, hypertension, hyperlipidemia, and ischemic cardiomyopathy/HFrEF with previous EF of 30-35% and subsequent recovery (45-50% - 12/2017), who presents for follow-up of CAD.  Past Medical History    Past Medical History:  Diagnosis Date  . Arthritis    neck  . Bladder tumor   . CAD (coronary artery disease)    a. 08/2017 STEMI/Cath: LM 80ost/d, LAD 100p, LCX 95 ost/p, RCA 60p, 60m, RPDA 80ost; b. 08/2017 CABG x 3: LIMA->LAD, VG->OM, VG->PDA.  . Cancer [facility])    bladder tumor  . Essential hypertension   . HFrEF (heart failure with reduced ejection fraction) ([place])    a. 08/2017 TEE: EF 30-35%, sept/ant/inf HK, apical AK. 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 12/2017 Echo: EF 45-50%, diff HK. Gr1 DD. Mildly reduced RV fxn. Mild RAE.  Marland Kitchen Hyperlipidemia LDL goal <70   . Ischemic cardiomyopathy    a. 08/2017 TEE: EF 30-35%; b. 12/2017 Echo: EF 45-50%.  . Myocardial infarction ([place]) 08/27/2017  . Shingles    2018 - right side  . Vertigo    several months ago   Past Surgical History:  Procedure Laterality Date  . CATARACT EXTRACTION W/PHACO Left 07/18/2018   Procedure: CATARACT EXTRACTION PHACO AND INTRAOCULAR LENS PLACEMENT ([place])  LEFT;  Surgeon: Eulogio Bear, MD;  Location: [place];  Service: Ophthalmology;  Laterality: Left;  . CATARACT EXTRACTION W/PHACO Right 08/14/2018   Procedure: CATARACT EXTRACTION PHACO AND INTRAOCULAR LENS PLACEMENT ([place])  RIGHT;  Surgeon: Eulogio Bear, MD;  Location: [place];  Service: Ophthalmology;  Laterality: Right;  . CORONARY ARTERY BYPASS GRAFT N/A 08/27/2017   Procedure: CORONARY ARTERY BYPASS GRAFTING (CABG) ON PUMP USING LEFT INTERNAL MAMMARY ARTERY AND LEFT GREATER SAPHENOUS VEIN VIA  ENDOVEIN HARVEST;  Surgeon: Gaye Pollack, MD;  Location: [place];  Service: Open Heart Surgery;  Laterality: N/A;  . CORONARY/GRAFT ACUTE MI REVASCULARIZATION N/A 08/27/2017   Procedure: Coronary/Graft Acute MI Revascularization;  Surgeon: Wellington Hampshire, MD;  Location: [place] CV LAB;  Service: Cardiovascular;  Laterality: N/A;  . CYSTOSCOPY W/ RETROGRADES Bilateral 12/25/2018   Procedure: CYSTOSCOPY WITH RETROGRADE PYELOGRAM;  Surgeon: Hollice Espy, MD;  Location: [facility];  Service: Urology;  Laterality: Bilateral;  . FRACTURE SURGERY Right 1958   arm and left wrist compound fracture, no metal  . HERNIA REPAIR Right    inguinial  . LEFT HEART CATH AND CORONARY ANGIOGRAPHY N/A 08/27/2017   Procedure: LEFT HEART CATH AND CORONARY ANGIOGRAPHY;  Surgeon: Wellington Hampshire, MD;  Location: [place] [facility];  Service: Cardiovascular;  Laterality: N/A;  . TRANSURETHRAL RESECTION OF BLADDER TUMOR WITH MITOMYCIN-C N/A 12/25/2018   Procedure: TRANSURETHRAL RESECTION OF BLADDER TUMOR WITH Gemcitabine;  Surgeon: Hollice Espy, MD;  Location: [facility];  Service: Urology;  Laterality: N/A;    Allergies  No Known Allergies  History of Present Illness    74 year old male with the above complex past medical history including hypertension, hyperlipidemia coronary artery disease status post CABG, and ischemic cardiomyopathy/HFrEF.  In July 2019, he was admitted with chest pain and new left bundle branch block.  Catheterization showed multivessel disease and following intra-aortic balloon pump placement, he was transferred to [facility] where he underwent  emergent CABG x3.  Intraoperative TEE showed an EF of 30 to 35%.  Follow-up echo in November 2019 showed improvement in LV function to 45-50%.  In February 2020, he was seen in the emergency department with musculoskeletal chest pain and normal troponins.  More recently, he was evaluated in the setting of hematuria in September 2020.  Plavix was  discontinued.  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  He notes stability from a cardiac standpoint, denying any episodes of chest pain and experiencing mild dyspnea with higher levels of activity.  He does not routinely exercise but has several goats and a meal and is responsible for their care.  In so doing, he sometimes notes dyspnea on exertion but does not think this has changed recently.  He denies palpitations, PND, orthopnea, dizziness, syncope, edema, or early satiety.  Home Medications    Prior to Admission medications   Medication Sig Start Date End Date Taking? Authorizing Provider  acetaminophen (TYLENOL) 650 MG CR tablet Take 650 mg by mouth every 8 (eight) hours as needed for pain.   Yes [provider]  aspirin EC 81 MG EC tablet Take 1 tablet (81 mg total) by mouth daily. 09/03/17  Yes Gold, Wayne E, PA-C  atorvastatin (LIPITOR) 80 MG tablet Take 1 tablet (80 mg total) by mouth daily. 09/25/18  Yes Theora Gianotti, NP  carvedilol (COREG) 12.5 MG tablet Take 1 tablet (12.5 mg total) by mouth 2 (two) times daily with a meal. 04/02/19  Yes Theora Gianotti, NP  losartan (COZAAR) 50 MG tablet Take 1 tablet (50 mg total) by mouth daily. 04/02/19  Yes Theora Gianotti, NP  Multiple Vitamin (MULTIVITAMIN WITH MINERALS) TABS tablet Take 1 tablet by mouth daily. One a Day over 50/ lunch   Yes [provider]  tamsulosin (FLOMAX) 0.4 MG CAPS capsule Take 1 capsule (0.4 mg total) by mouth daily after supper. 11/15/18  Yes Festus Aloe, MD    Review of Systems    Chronic dyspnea on exertion with higher levels of activity.  He denies chest pain, palpitations, PND, orthopnea, dizziness, syncope, edema, or early satiety.  All other systems reviewed and are otherwise negative except as noted above.  Physical Exam    VS:  BP 114/62 (BP Location: Left Arm, Patient Position: Sitting, Cuff Size:  Large)   Pulse 74   Ht 6' (1.829 m)   Wt 254 lb 12 oz (115.6 kg)   SpO2 97%   BMI 34.55 kg/m  , BMI Body mass index is 34.55 kg/m. GEN: Well nourished, well developed, in no acute distress. HEENT: normal. Neck: Supple, no JVD, carotid bruits, or masses. Cardiac: RRR, no murmurs, rubs, or gallops. No clubbing, cyanosis, edema.  Radials/PT 2+ and equal bilaterally.  Respiratory:  Respirations regular and unlabored, clear to auscultation bilaterally. GI: Soft, nontender, nondistended, BS + x 4. MS: no deformity or atrophy. Skin: warm and dry, no rash. Neuro:  Strength and sensation are intact. Psych: Normal affect.  Accessory Clinical Findings    ECG personally reviewed by me today -regular sinus rhythm, 74, IVCD, septal infarct, with inferolateral ST depression and T wave inversion.  Findings are similar though more pronounced than what was seen on 2019 ECGs.  Most recent ECG in September 2020 showed a right bundle branch block which is not present today.  Lab Results  Component Value Date   WBC 7.9 12/21/2018   HGB 14.3 12/21/2018  HCT 42.9 12/21/2018   MCV 91.1 12/21/2018   PLT 160 12/21/2018   Lab Results  Component Value Date   CREATININE 1.05 12/21/2018   BUN 10 12/21/2018   NA 140 12/21/2018   K 4.1 12/21/2018   CL 108 12/21/2018   CO2 24 12/21/2018   Lab Results  Component Value Date   ALT 23 11/11/2018   AST 23 11/11/2018   ALKPHOS 55 11/11/2018   BILITOT 1.4 (H) 11/11/2018   Lab Results  Component Value Date   CHOL 104 12/28/2017   HDL 30 (L) 12/28/2017   LDLCALC 58 12/28/2017   LDLDIRECT 58 12/28/2017   TRIG 80 12/28/2017   CHOLHDL 3.5 12/28/2017    Lab Results  Component Value Date   HGBA1C 5.6 08/28/2017    Assessment & Plan    1.  CAD status post CABG x3: Status post STEMI in July 2019 followed by emergent CABG x3.  He has not been experiencing any chest pain and has stable, chronic mild dyspnea on exertion and higher levels of activity.  He  remains on aspirin, statin, beta-blocker, and ARB therapy.  I have sent in a prescription for sublingual nitroglycerin to be used as needed.  I will arrange for follow-up fasting lipids sometime next week when he is fasting.  2.  Essential hypertension: Stable on beta-blocker and ARB therapy.  3.  Ischemic cardiomyopathy/HFrEF: Euvolemic on examination.  EF previously 30 to 35% at the time of his MI with recovery to 45-50% on echo in November 2019.  He remains on beta-blocker and ARB therapy.  4.  Hyperlipidemia: Last LDL was 58 in November 2019.  As above, I will arrange for follow-up fasting lipids.  He had normal AST and ALT in September 2020.  5.  Disposition: Follow-up fasting lipids.  Follow-up in 6 months or sooner if necessary.  Murray Hodgkins, NP 04/11/2019, 4:45 PM

## 2019-04-11 NOTE — Patient Instructions (Signed)
Medication Instructions:  Your physician recommends that you continue on your current medications as directed. Please refer to the Current Medication list given to you today.  *If you need a refill on your cardiac medications before your next appointment, please call your pharmacy*  Lab Work: none If you have labs (blood work) drawn today and your tests are completely normal, you will receive your results only by: Marland Kitchen MyChart Message (if you have MyChart) OR . A paper copy in the mail If you have any lab test that is abnormal or we need to change your treatment, we will call you to review the results.  Testing/Procedures: none  Follow-Up: At [facility], you and your health needs are our priority.  As part of our continuing mission to provide you with exceptional heart care, we have created designated Provider Care Teams.  These Care Teams include your primary Cardiologist (physician) and Advanced Practice Providers (APPs -  Physician Assistants and Nurse Practitioners) who all work together to provide you with the care you need, when you need it.  Your next appointment:   6 month(s)  The format for your next appointment:   In Person  Provider:    You may see Nelva Bush, MD or one of the following Advanced Practice Providers on your designated Care Team:    Murray Hodgkins, NP  Christell Faith, PA-C  Marrianne Mood, PA-C

## 2019-04-17 ENCOUNTER — Ambulatory Visit (INDEPENDENT_AMBULATORY_CARE_PROVIDER_SITE_OTHER): Payer: Medicare Other | Admitting: Urology

## 2019-04-17 ENCOUNTER — Other Ambulatory Visit: Payer: Self-pay

## 2019-04-17 VITALS — BP 138/71 | HR 66 | Ht 72.0 in | Wt 255.0 lb

## 2019-04-17 DIAGNOSIS — N3001 Acute cystitis with hematuria: Secondary | ICD-10-CM

## 2019-04-17 DIAGNOSIS — Z8551 Personal history of malignant neoplasm of bladder: Secondary | ICD-10-CM

## 2019-04-17 NOTE — Progress Notes (Signed)
   04/17/19  CC:  Chief Complaint  Patient presents with  . Cysto    HPI: 74 year old male who presents today for follow-up surveillance cystoscopy.  He did have an episode of gross hematuria.  He was taken to the operating room after a tumor was identified.  He underwent TURBT on 12/2018 which of 11 L low recurring tumor was resected.  Bilateral retrogrades were negative.  He did receive chemotherapy.  Surgical pathology consistent with low-grade TA TCC.  No urinary complaints.  No gross hematuria.  NED. A&Ox3.   No respiratory distress   Abd soft, NT, ND Normal phallus with bilateral descended testicles  Cystoscopy Procedure Note  Patient identification was confirmed, informed consent was obtained, and patient was prepped using Betadine solution.  Lidocaine jelly was administered per urethral meatus.     Pre-Procedure: - Inspection reveals a normal caliber ureteral meatus.  Procedure: The flexible cystoscope was introduced without difficulty - No urethral strictures/lesions are present. - Enlarged prostate bilobar coaptation - Normal bladder neck - Bilateral ureteral orifices identified - Bladder mucosa  reveals no ulcers, tumors, or lesions - No bladder stones - No trabeculation  Retroflexion shows unremarkable   Post-Procedure: - Patient tolerated the procedure well  Assessment/ Plan:  1. History of bladder cancer NED  Given low-grade noninvasive tumor without recurrence, recommended cystoscopy again in 6 months and if it is negative thereafter, can transition to q. annually per guidelines.  He is agreeable to plan. - Urinalysis, Complete    Return in about 6 months (around 10/15/2019).  Hollice Espy, MD

## 2019-04-18 LAB — MICROSCOPIC EXAMINATION
Epithelial Cells (non renal): NONE SEEN /hpf (ref 0–10)
RBC, Urine: NONE SEEN /hpf (ref 0–2)

## 2019-04-18 LAB — URINALYSIS, COMPLETE
Bilirubin, UA: NEGATIVE
Glucose, UA: NEGATIVE
Leukocytes,UA: NEGATIVE
Nitrite, UA: NEGATIVE
RBC, UA: NEGATIVE
Specific Gravity, UA: >1.03 — ABNORMAL HIGH (ref 1.005–1.030)
Urobilinogen, Ur: 0.2 mg/dL (ref 0.2–1.0)
pH, UA: 5 (ref 5.0–7.5)

## 2019-05-14 IMAGING — DX DG CHEST 1V PORT
1 series · 1 of 1 positions shown · non-contrast
Comparison: August 28, 2017

CLINICAL DATA: Status postextubation

EXAM:
PORTABLE CHEST 1 VIEW

[chest]
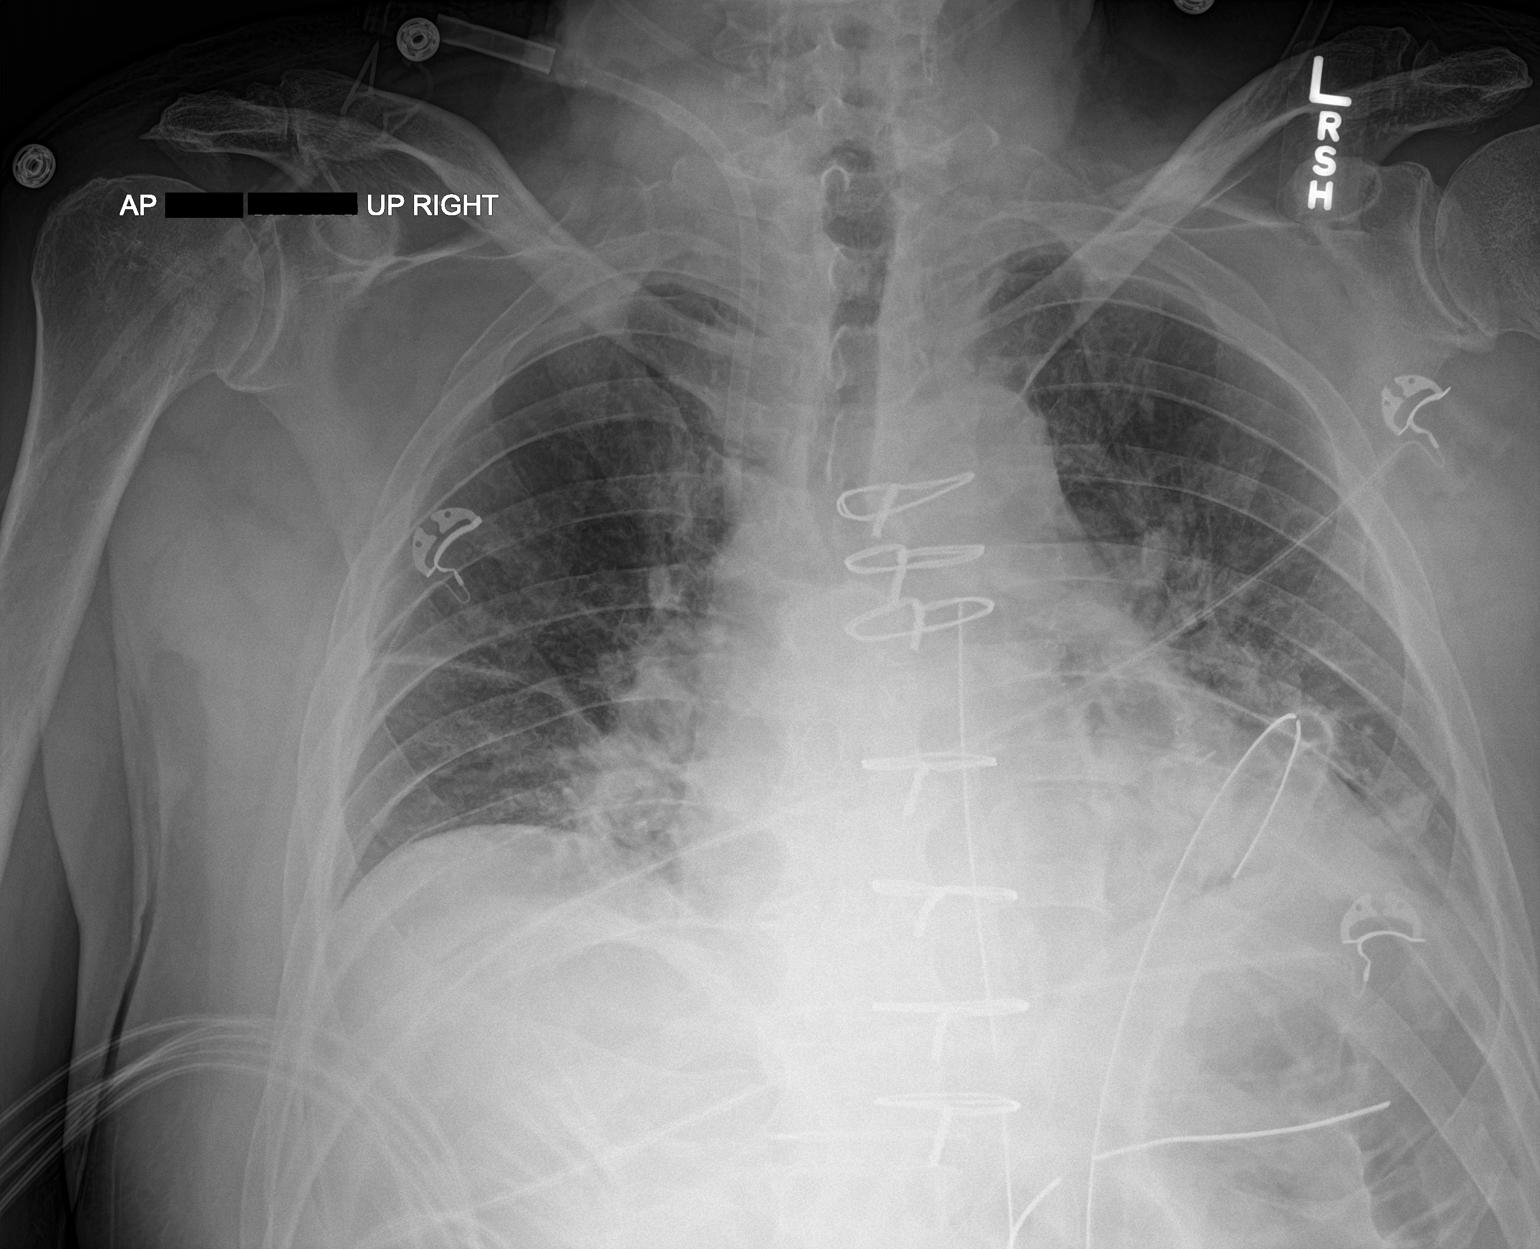

[1 of 1 positions shown; findings below may reference images not displayed]

FINDINGS: Endotracheal tube and nasogastric tube have been removed.
Intra-aortic balloon pump is no longer evident. Swan-Ganz catheter
is been removed. Cordis tip is in the superior vena cava. Left chest
tube and mediastinal drain remain. No pneumothorax. There is patchy
consolidation in the left base. There is also atelectatic change in
each lower lobe. Heart is upper normal in size with pulmonary
vascularity normal. No adenopathy. Status post coronary artery
bypass grafting. No evident bone lesions.
IMPRESSION: Tube and catheter positions as described without pneumothorax.
Persistent patchy infiltrate left base with bibasilar atelectasis as
well. Stable cardiac silhouette.

## 2019-05-15 IMAGING — DX DG CHEST 1V PORT
1 series · 1 of 1 positions shown · non-contrast
Comparison: 08/29/2017

CLINICAL DATA: 71-year-old male with a history of CABG

EXAM:
PORTABLE CHEST 1 VIEW

[chest]
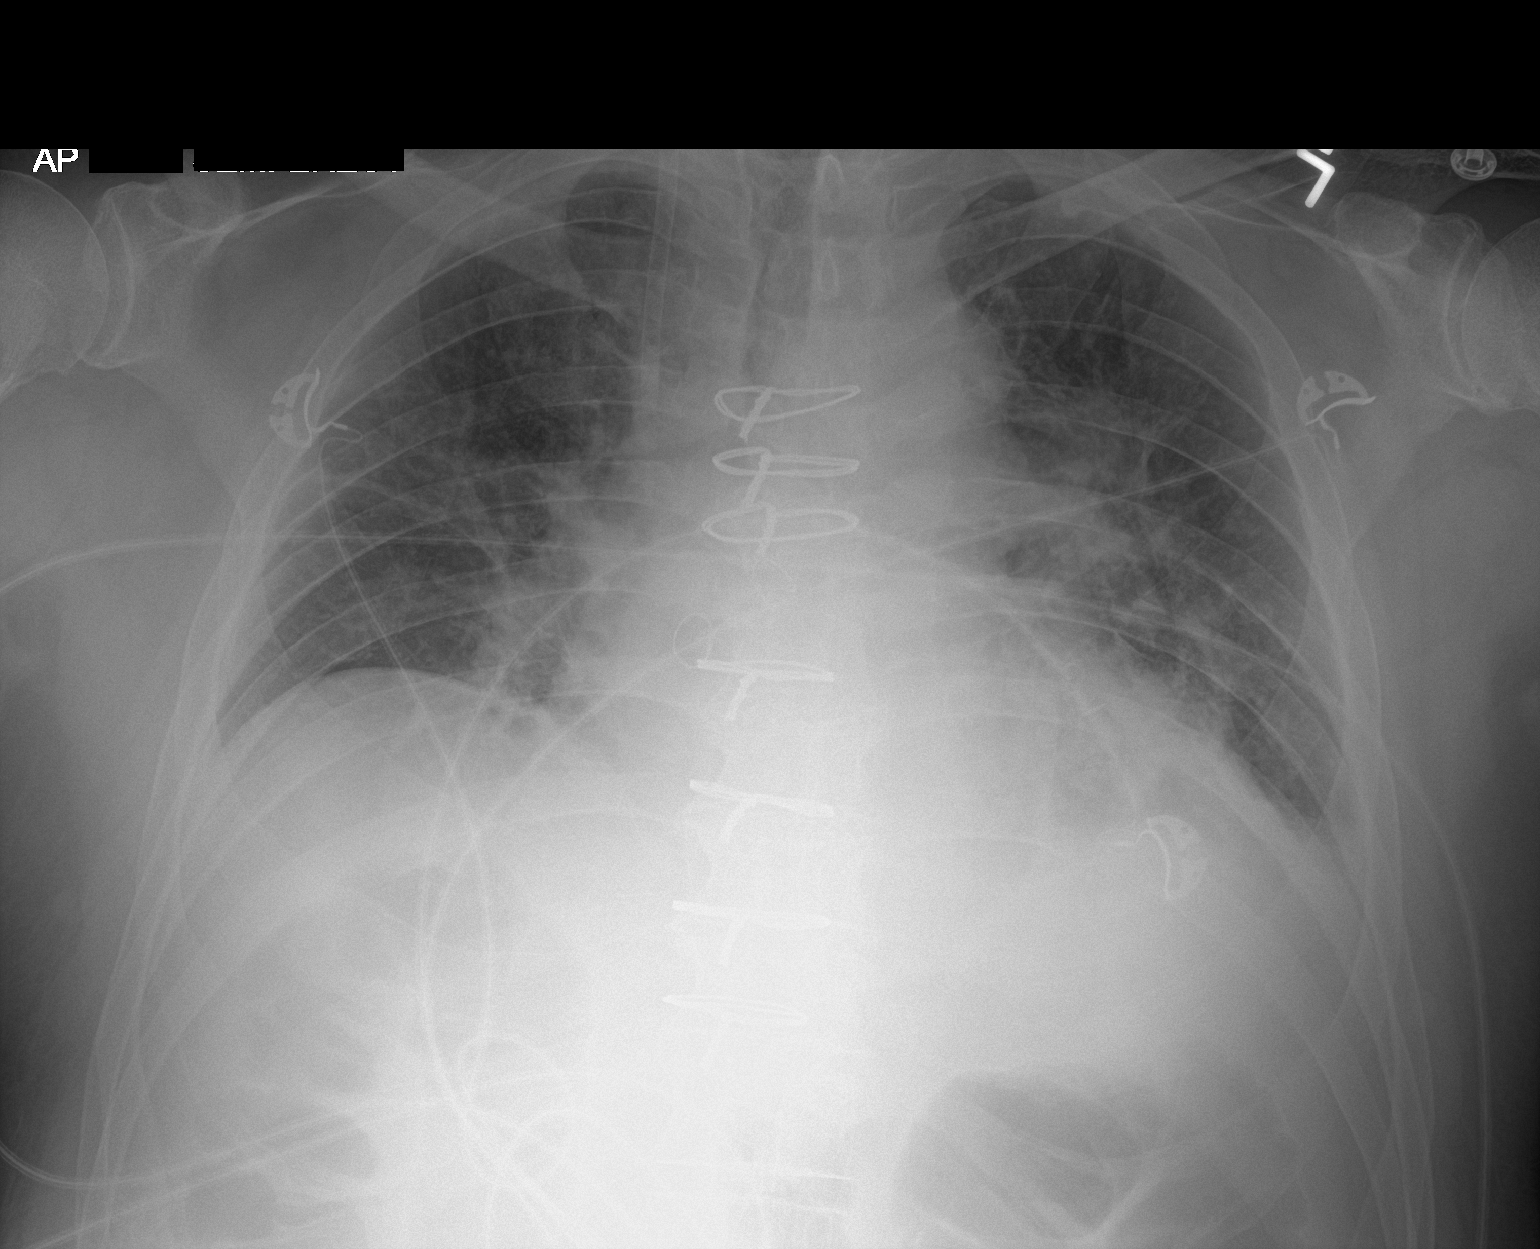

[1 of 1 positions shown; findings below may reference images not displayed]

FINDINGS: Cardiomediastinal silhouette unchanged with surgical changes of
median sternotomy and CABG.

Poorly defined heart borders with low lung volumes and patchy
opacities at the lung bases. No pneumothorax.

Unchanged right IJ sheath.

Interval removal of pleural/mediastinal drains.

Blunting of the left costophrenic angle.
IMPRESSION: Interval removal of mediastinal/pleural drains without evidence of
pneumothorax.

Low lung volumes with atelectasis, and likely small left pleural
effusion.

Unchanged right IJ sheath.

## 2019-09-19 ENCOUNTER — Telehealth: Payer: Self-pay | Admitting: Internal Medicine

## 2019-09-19 ENCOUNTER — Other Ambulatory Visit: Payer: Self-pay

## 2019-09-19 MED ORDER — CARVEDILOL 12.5 MG PO TABS: 12.5000 mg | ORAL_TABLET | Freq: Two times a day (BID) | ORAL | 0 refills | Status: DC

## 2019-09-19 MED ORDER — LOSARTAN POTASSIUM 50 MG PO TABS: 50.0000 mg | ORAL_TABLET | Freq: Every day | ORAL | 0 refills | Status: DC

## 2019-09-19 NOTE — Telephone Encounter (Signed)
*  STAT* If patient is at the pharmacy, call can be transferred to refill team.   1. Which medications need to be refilled? (please list name of each medication and dose if known) losartan 50 mg and carvedilol 12.5 bid  2. Which pharmacy/location (including street and city if local pharmacy) is medication to be sent to? [facility] [place]  3. Do they need a 30 day or 90 day supply? [place]

## 2019-09-19 NOTE — Telephone Encounter (Signed)
Refills Sent

## 2019-09-20 ENCOUNTER — Other Ambulatory Visit: Payer: Self-pay | Admitting: *Deleted

## 2019-09-20 MED ORDER — LOSARTAN POTASSIUM 50 MG PO TABS: 50.0000 mg | ORAL_TABLET | Freq: Every day | ORAL | 0 refills | Status: DC

## 2019-09-20 MED ORDER — CARVEDILOL 12.5 MG PO TABS: 12.5000 mg | ORAL_TABLET | Freq: Two times a day (BID) | ORAL | 0 refills | Status: DC

## 2019-10-17 ENCOUNTER — Other Ambulatory Visit: Payer: Medicare Other | Admitting: Urology

## 2019-11-20 ENCOUNTER — Other Ambulatory Visit: Payer: Medicare Other | Admitting: Urology

## 2019-12-18 ENCOUNTER — Other Ambulatory Visit: Payer: Medicare Other | Admitting: Urology

## 2019-12-20 ENCOUNTER — Other Ambulatory Visit: Payer: Self-pay | Admitting: *Deleted

## 2019-12-20 MED ORDER — CARVEDILOL 12.5 MG PO TABS: 12.5000 mg | ORAL_TABLET | Freq: Two times a day (BID) | ORAL | 0 refills | Status: DC

## 2019-12-20 MED ORDER — ATORVASTATIN CALCIUM 80 MG PO TABS: 80.0000 mg | ORAL_TABLET | Freq: Every day | ORAL | 0 refills | Status: DC

## 2019-12-20 NOTE — Telephone Encounter (Signed)
Patient called in and scheduled for 11/10 with Sharolyn Douglas and would like medications if able to get him through the waiting period

## 2019-12-20 NOTE — Telephone Encounter (Signed)
Attempted to schedule.  LMOV to call office.  ° °

## 2019-12-24 ENCOUNTER — Other Ambulatory Visit: Payer: Self-pay | Admitting: Cardiovascular Disease

## 2019-12-24 MED ORDER — LOSARTAN POTASSIUM 50 MG PO TABS
50.0000 mg | ORAL_TABLET | Freq: Every day | ORAL | 0 refills | Status: DC
Start: 2019-12-24 — End: 2020-01-02

## 2019-12-24 MED ORDER — CARVEDILOL 12.5 MG PO TABS: 12.5000 mg | ORAL_TABLET | Freq: Two times a day (BID) | ORAL | 0 refills | Status: DC

## 2019-12-24 NOTE — Telephone Encounter (Signed)
Rx request sent to pharmacy.  

## 2019-12-24 NOTE — Telephone Encounter (Signed)
*  STAT* If patient is at the pharmacy, call can be transferred to refill team.   1. Which medications need to be refilled? (please list name of each medication and dose if known) losartan 50 mg and carvedilol 12.5 bid  2. Which pharmacy/location (including street and city if local pharmacy) is medication to be sent to? [facility] [place]  3. Do they need a 30 day or 90 day supply? 90  Patient states the pharmacy did not have the Rx when we went in to pick it up the last two days

## 2020-01-02 ENCOUNTER — Encounter: Payer: Self-pay | Admitting: Nurse Practitioner

## 2020-01-02 ENCOUNTER — Telehealth: Payer: Self-pay

## 2020-01-02 ENCOUNTER — Ambulatory Visit (INDEPENDENT_AMBULATORY_CARE_PROVIDER_SITE_OTHER): Payer: Medicare Other | Admitting: Nurse Practitioner

## 2020-01-02 ENCOUNTER — Other Ambulatory Visit: Payer: Self-pay

## 2020-01-02 VITALS — BP 150/90 | HR 74 | Ht 72.0 in | Wt 245.0 lb

## 2020-01-02 DIAGNOSIS — I2510 Atherosclerotic heart disease of native coronary artery without angina pectoris: Secondary | ICD-10-CM | POA: Diagnosis not present

## 2020-01-02 DIAGNOSIS — E785 Hyperlipidemia, unspecified: Secondary | ICD-10-CM

## 2020-01-02 DIAGNOSIS — I5020 Unspecified systolic (congestive) heart failure: Secondary | ICD-10-CM | POA: Diagnosis not present

## 2020-01-02 DIAGNOSIS — I10 Essential (primary) hypertension: Secondary | ICD-10-CM | POA: Diagnosis not present

## 2020-01-02 DIAGNOSIS — I255 Ischemic cardiomyopathy: Secondary | ICD-10-CM

## 2020-01-02 DIAGNOSIS — I4510 Unspecified right bundle-branch block: Secondary | ICD-10-CM | POA: Diagnosis not present

## 2020-01-02 MED ORDER — ATORVASTATIN CALCIUM 80 MG PO TABS: 80.0000 mg | ORAL_TABLET | Freq: Every day | ORAL | 6 refills | Status: DC

## 2020-01-02 MED ORDER — LOSARTAN POTASSIUM 100 MG PO TABS
100.0000 mg | ORAL_TABLET | Freq: Every day | ORAL | 1 refills | Status: DC
Start: 2020-01-02 — End: 2020-07-11

## 2020-01-02 MED ORDER — CARVEDILOL 12.5 MG PO TABS: 12.5000 mg | ORAL_TABLET | Freq: Two times a day (BID) | ORAL | 2 refills | Status: DC

## 2020-01-02 NOTE — Telephone Encounter (Addendum)
Patient was seen today for an appt and was to have labs drawn.  Patient left before the RN entered the room. Attempted shortly after to reach the patient by phone. Unable to lmom. Patients voicemail is full.  Patient AVS mailed.

## 2020-01-02 NOTE — Patient Instructions (Signed)
Medication Instructions:  Your physician has recommended you make the following change in your medication:   INCREASE Losartan to 100 mg daily. An Rx has been sent to your pharmacy.  *If you need a refill on your cardiac medications before your next appointment, please call your pharmacy*   Lab Work: Cbc, Cmet, Lipid, Direct LDL today  If you have labs (blood work) drawn today and your tests are completely normal, you will receive your results only by: Marland Kitchen MyChart Message (if you have MyChart) OR . A paper copy in the mail If you have any lab test that is abnormal or we need to change your treatment, we will call you to review the results.   Testing/Procedures: None ordered   Follow-Up: At [facility], you and your health needs are our priority.  As part of our continuing mission to provide you with exceptional heart care, we have created designated Provider Care Teams.  These Care Teams include your primary Cardiologist (physician) and Advanced Practice Providers (APPs -  Physician Assistants and Nurse Practitioners) who all work together to provide you with the care you need, when you need it.  We recommend signing up for the patient portal called "MyChart".  Sign up information is provided on this After Visit Summary.  MyChart is used to connect with patients for Virtual Visits (Telemedicine).  Patients are able to view lab/test results, encounter notes, upcoming appointments, etc.  Non-urgent messages can be sent to your provider as well.   To learn more about what you can do with MyChart, go to [email or web address].    Your next appointment:   6 month(s)  The format for your next appointment:   In Person  Provider:   You may see Nelva Bush, MD or one of the following Advanced Practice Providers on your designated Care Team:    Murray Hodgkins, NP  Christell Faith, PA-C  Marrianne Mood, PA-C  Cadence Kathlen Mody, [place]    Other Instructions N/A

## 2020-01-02 NOTE — Progress Notes (Signed)
Office Visit    Patient Name: Cory Hess Date of Encounter: 01/02/2020  Primary Care Provider:  Patient, No Pcp Per Primary Cardiologist:  Nelva Bush, MD  Chief Complaint    74 year old male with a history of CAD status post CABG x3 in July 2019, hypertension, hyperlipidemia, ischemic cardiomyopathy, and HFrEF with previous EF of 30-35% and subsequent improvement to 45-50% (November 2019), who presents for CAD follow-up.  Past Medical History    Past Medical History:  Diagnosis Date  . Arthritis    neck  . Bladder tumor   . CAD (coronary artery disease)    a. 08/2017 STEMI/Cath: LM 80ost/d, LAD 100p, LCX 95 ost/p, RCA 60p, 61m, RPDA 80ost; b. 08/2017 CABG x 3: LIMA->LAD, VG->OM, VG->PDA.  . Cancer [facility])    bladder tumor  . Essential hypertension   . HFrEF (heart failure with reduced ejection fraction) ([place])    a. 08/2017 TEE: EF 30-35%, sept/ant/inf HK, apical AK. 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 12/2017 Echo: EF 45-50%, diff HK. Gr1 DD. Mildly reduced RV fxn. Mild RAE.  Marland Kitchen Hyperlipidemia LDL goal <70   . Ischemic cardiomyopathy    a. 08/2017 TEE: EF 30-35%; b. 12/2017 Echo: EF 45-50%.  . Myocardial infarction ([place]) 08/27/2017  . Shingles    2018 - right side  . Vertigo    several months ago   Past Surgical History:  Procedure Laterality Date  . CARDIAC CATHETERIZATION    . CATARACT EXTRACTION W/PHACO Left 07/18/2018   Procedure: CATARACT EXTRACTION PHACO AND INTRAOCULAR LENS PLACEMENT ([place])  LEFT;  Surgeon: Eulogio Bear, MD;  Location: [place];  Service: Ophthalmology;  Laterality: Left;  . CATARACT EXTRACTION W/PHACO Right 08/14/2018   Procedure: CATARACT EXTRACTION PHACO AND INTRAOCULAR LENS PLACEMENT ([place])  RIGHT;  Surgeon: Eulogio Bear, MD;  Location: [place];  Service: Ophthalmology;  Laterality: Right;  . CORONARY ARTERY BYPASS GRAFT N/A 08/27/2017   Procedure: CORONARY ARTERY BYPASS GRAFTING (CABG) ON PUMP USING LEFT INTERNAL  MAMMARY ARTERY AND LEFT GREATER SAPHENOUS VEIN VIA ENDOVEIN HARVEST;  Surgeon: Gaye Pollack, MD;  Location: [place];  Service: Open Heart Surgery;  Laterality: N/A;  . CORONARY/GRAFT ACUTE MI REVASCULARIZATION N/A 08/27/2017   Procedure: Coronary/Graft Acute MI Revascularization;  Surgeon: Wellington Hampshire, MD;  Location: [place] CV [facility];  Service: Cardiovascular;  Laterality: N/A;  . CYSTOSCOPY W/ RETROGRADES Bilateral 12/25/2018   Procedure: CYSTOSCOPY WITH RETROGRADE PYELOGRAM;  Surgeon: Hollice Espy, MD;  Location: [facility];  Service: Urology;  Laterality: Bilateral;  . FRACTURE SURGERY Right 1958   arm and left wrist compound fracture, no metal  . HERNIA REPAIR Right    inguinial  . LEFT HEART CATH AND CORONARY ANGIOGRAPHY N/A 08/27/2017   Procedure: LEFT HEART CATH AND CORONARY ANGIOGRAPHY;  Surgeon: Wellington Hampshire, MD;  Location: [place] [facility];  Service: Cardiovascular;  Laterality: N/A;  . TRANSURETHRAL RESECTION OF BLADDER TUMOR WITH MITOMYCIN-C N/A 12/25/2018   Procedure: TRANSURETHRAL RESECTION OF BLADDER TUMOR WITH Gemcitabine;  Surgeon: Hollice Espy, MD;  Location: [facility];  Service: Urology;  Laterality: N/A;    Allergies  No Known Allergies  History of Present Illness    74 year old male with above complex past medical history including hypertension, hyperlipidemia, CAD status post CABG, and ischemic cardiomyopathy/HFrEF.  In July 2018, he was admitted with chest pain and new left bundle branch block.  Catheterization showed multivessel disease and following intra-aortic balloon pump placement, he was transferred to [facility]  where he underwent emergent CABG x3.  Intraoperative TEE showed an EF of 30-35%.  Follow-up echo in November 2019 showed improvement in LV function of 45-50%.  In February 2020, he was seen in the emergency department musculoskeletal chest pain and normal troponins.  More recently, he was evaluated in the setting of hematuria in September 2020.   Plavix was discontinued.  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  He was last in cardiology clinic in February 2021, at which time he was doing well.  Over the past 9 months, he has continued to do well.  He does some work out in his yard from time to time and tolerates this well.  He is not routinely exercising.  He denies chest pain, dyspnea, palpitations, PND, orthopnea, dizziness, syncope, edema, or early satiety.  His blood pressure is elevated today and he notes that he does not routinely check this at home.  His pressure in February was 114/62, though he is 150/90 today.  He reports good compliance with medications though is due for refills on several.  Home Medications    Prior to Admission medications   Medication Sig Start Date End Date Taking? Authorizing Provider  acetaminophen (TYLENOL) 650 MG CR tablet Take 650 mg by mouth every 8 (eight) hours as needed for pain.    [provider]  aspirin EC 81 MG EC tablet Take 1 tablet (81 mg total) by mouth daily. 09/03/17   Gold, Wayne E, PA-C  atorvastatin (LIPITOR) 80 MG tablet Take 1 tablet (80 mg total) by mouth daily. MUST KEEP SCHEDULED OFFICE VISIT 12/20/19   Theora Gianotti, NP  carvedilol (COREG) 12.5 MG tablet Take 1 tablet (12.5 mg total) by mouth 2 (two) times daily with a meal. MUST KEEP SCHEDULED APPOINTMENT 12/24/19   End, Harrell Gave, MD  losartan (COZAAR) 50 MG tablet Take 1 tablet (50 mg total) by mouth daily. Please keep your upcoming appointment for refills. 12/24/19   End, Harrell Gave, MD  Multiple Vitamin (MULTIVITAMIN WITH MINERALS) TABS tablet Take 1 tablet by mouth daily. One a Day over 50/ lunch    [provider]  nitroGLYCERIN (NITROSTAT) 0.4 MG SL tablet Place 1 tablet (0.4 mg total) under the tongue every 5 (five) minutes as needed for chest pain. Maximum of 3 doses. 04/11/19 07/10/19  Theora Gianotti, NP  tamsulosin (FLOMAX) 0.4 MG  CAPS capsule Take 1 capsule (0.4 mg total) by mouth daily after supper. 11/15/18   Festus Aloe, MD    Review of Systems    He denies chest pain, palpitations, dyspnea, pnd, orthopnea, n, v, dizziness, syncope, edema, weight gain, or early satiety.  All other systems reviewed and are otherwise negative except as noted above.  Physical Exam    VS:  BP (!) 150/90 (BP Location: Left Arm, Patient Position: Sitting, Cuff Size: Large)   Pulse 74   Ht 6' (1.829 m)   Wt 245 lb (111.1 kg)   SpO2 97%   BMI 33.23 kg/m  , BMI Body mass index is 33.23 kg/m. GEN: Well nourished, well developed, in no acute distress. HEENT: normal. Neck: Supple, no JVD, carotid bruits, or masses. Cardiac: RRR, no murmurs, rubs, or gallops. No clubbing, cyanosis, edema.  Radials/PT 2+ and equal bilaterally.  Respiratory:  Respirations regular and unlabored, clear to auscultation bilaterally. GI: Soft, nontender, nondistended, BS + x 4. MS: no deformity or atrophy. Skin: warm and dry, no rash. Neuro:  Strength and  sensation are intact. Psych: Normal affect.  Accessory Clinical Findings    ECG personally reviewed by me today -regular sinus rhythm, 74, left axis deviation, right bundle branch block.  Lab Results  Component Value Date   WBC 7.9 12/21/2018   HGB 14.3 12/21/2018   HCT 42.9 12/21/2018   MCV 91.1 12/21/2018   PLT 160 12/21/2018   Lab Results  Component Value Date   CREATININE 1.05 12/21/2018   BUN 10 12/21/2018   NA 140 12/21/2018   K 4.1 12/21/2018   CL 108 12/21/2018   CO2 24 12/21/2018   Lab Results  Component Value Date   ALT 23 11/11/2018   AST 23 11/11/2018   ALKPHOS 55 11/11/2018   BILITOT 1.4 (H) 11/11/2018   Lab Results  Component Value Date   CHOL 104 12/28/2017   HDL 30 (L) 12/28/2017   LDLCALC 58 12/28/2017   LDLDIRECT 58 12/28/2017   TRIG 80 12/28/2017   CHOLHDL 3.5 12/28/2017    Lab Results  Component Value Date   HGBA1C 5.6 08/28/2017    Assessment &  Plan    1.  CAD status post CABG x3: Status post STEMI in July 2019 followed by emergent CABG x3.  He has continued to do well over the past 9 months without chest pain or dyspnea.  He remains on aspirin, statin, beta-blocker, and ARB therapy.  2.  Essential hypertension: Blood pressure elevated today.  He reports good compliance with medications.  Continue beta-blocker and increase losartan to 100 mg daily.  Follow-up basic metabolic panel today.  3.  Ischemic cardiomyopathy/HFrEF: Euvolemic on examination and doing well.  Most recent EF 45-50% by echo November 2019.  He remains on beta-blocker and ARB therapy.  4.  Hyperlipidemia: He was supposed to have lipids drawn in February but did not show.  He was agreeable to have labs drawn today including lipids and LFTs.  It appears however, he left clinic prior to having labs drawn.  Nursing staff will reach out to him.  5.  RBBB: Patient with a history of left bundle branch block, noted to have right bundle branch block on today's ECG.  As above, asymptomatic.  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  No high-grade AV block, though PR interval is 200 ms compared to 164 ms in February of this year.  We will have to watch for worsening conduction disease over time.   6.  Disposition: Follow-up CBC, complete metabolic panel, and lipids with direct LDL.  Follow-up in clinic in 6 months or sooner if necessary.   Murray Hodgkins, NP 01/02/2020, 2:59 PM

## 2020-01-08 NOTE — Telephone Encounter (Signed)
2nd attempt. Unable lmom.

## 2020-07-11 ENCOUNTER — Telehealth: Payer: Self-pay | Admitting: Internal Medicine

## 2020-07-11 MED ORDER — LOSARTAN POTASSIUM 100 MG PO TABS
100.0000 mg | ORAL_TABLET | Freq: Every day | ORAL | 0 refills | Status: DC
Start: 2020-07-11 — End: 2020-09-17

## 2020-07-11 NOTE — Telephone Encounter (Signed)
Rx sent to the pharmacy per patient's request for #[phone]

## 2020-07-11 NOTE — Telephone Encounter (Signed)
*  STAT* If patient is at the pharmacy, call can be transferred to refill team.   1. Which medications need to be refilled? (please list name of each medication and dose if known) losartan  2. Which pharmacy/location (including street and city if local pharmacy) is medication to be sent to? [facility] on [address]  3. Do they need a 30 day or 90 day supply? 90  Patient is at pharmacy now Patient is scheduled for

## 2020-07-11 NOTE — Telephone Encounter (Signed)
Scheduled for 6/3  

## 2020-07-25 ENCOUNTER — Ambulatory Visit (INDEPENDENT_AMBULATORY_CARE_PROVIDER_SITE_OTHER): Payer: Medicare Other | Admitting: Nurse Practitioner

## 2020-07-25 ENCOUNTER — Other Ambulatory Visit: Payer: Self-pay

## 2020-07-25 ENCOUNTER — Encounter: Payer: Self-pay | Admitting: Nurse Practitioner

## 2020-07-25 VITALS — BP 128/60 | HR 66 | Ht 72.0 in | Wt 245.1 lb

## 2020-07-25 DIAGNOSIS — I255 Ischemic cardiomyopathy: Secondary | ICD-10-CM | POA: Diagnosis not present

## 2020-07-25 DIAGNOSIS — E785 Hyperlipidemia, unspecified: Secondary | ICD-10-CM | POA: Diagnosis not present

## 2020-07-25 DIAGNOSIS — I5020 Unspecified systolic (congestive) heart failure: Secondary | ICD-10-CM | POA: Diagnosis not present

## 2020-07-25 DIAGNOSIS — I10 Essential (primary) hypertension: Secondary | ICD-10-CM | POA: Diagnosis not present

## 2020-07-25 DIAGNOSIS — I2510 Atherosclerotic heart disease of native coronary artery without angina pectoris: Secondary | ICD-10-CM

## 2020-07-25 MED ORDER — NITROGLYCERIN 0.4 MG SL SUBL: 0.4000 mg | SUBLINGUAL_TABLET | SUBLINGUAL | 1 refills | Status: DC | PRN

## 2020-07-25 NOTE — Progress Notes (Signed)
Office Visit    Patient Name: Cory Hess Date of Encounter: 07/25/2020  Primary Care Provider:  Patient, No Pcp Per (Inactive) Primary Cardiologist:  Nelva Bush, MD  Chief Complaint    75 year old male with history of CAD status post CABG x3 in July 2019, hypertension, hyperlipidemia, ischemic cardiomyopathy, and HFrEF with previous EF of 30 to 35% and subsequent improvement to 40-45% (12/2017) since, presents for follow-up of CAD.  Past Medical History    Past Medical History:  Diagnosis Date  . Arthritis    neck  . Bladder tumor   . CAD (coronary artery disease)    a. 08/2017 STEMI/Cath: LM 80ost/d, LAD 100p, LCX 95 ost/p, RCA 60p, 22m, RPDA 80ost; b. 08/2017 CABG x 3: LIMA->LAD, VG->OM, VG->PDA.  . Cancer [facility])    bladder tumor  . Essential hypertension   . HFrEF (heart failure with reduced ejection fraction) ([place])    a. 08/2017 TEE: EF 30-35%, sept/ant/inf HK, apical AK. 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 12/2017 Echo: EF 45-50%, diff HK. Gr1 DD. Mildly reduced RV fxn. Mild RAE.  Marland Kitchen Hyperlipidemia LDL goal <70   . Ischemic cardiomyopathy    a. 08/2017 TEE: EF 30-35%; b. 12/2017 Echo: EF 45-50%.  . Myocardial infarction ([place]) 08/27/2017  . Shingles    2018 - right side  . Vertigo    several months ago   Past Surgical History:  Procedure Laterality Date  . CARDIAC CATHETERIZATION    . CATARACT EXTRACTION W/PHACO Left 07/18/2018   Procedure: CATARACT EXTRACTION PHACO AND INTRAOCULAR LENS PLACEMENT ([place])  LEFT;  Surgeon: Eulogio Bear, MD;  Location: [place];  Service: Ophthalmology;  Laterality: Left;  . CATARACT EXTRACTION W/PHACO Right 08/14/2018   Procedure: CATARACT EXTRACTION PHACO AND INTRAOCULAR LENS PLACEMENT ([place])  RIGHT;  Surgeon: Eulogio Bear, MD;  Location: [place];  Service: Ophthalmology;  Laterality: Right;  . CORONARY ARTERY BYPASS GRAFT N/A 08/27/2017   Procedure: CORONARY ARTERY BYPASS GRAFTING (CABG) ON PUMP USING LEFT  INTERNAL MAMMARY ARTERY AND LEFT GREATER SAPHENOUS VEIN VIA ENDOVEIN HARVEST;  Surgeon: Gaye Pollack, MD;  Location: [place];  Service: Open Heart Surgery;  Laterality: N/A;  . CORONARY/GRAFT ACUTE MI REVASCULARIZATION N/A 08/27/2017   Procedure: Coronary/Graft Acute MI Revascularization;  Surgeon: Wellington Hampshire, MD;  Location: [place] [facility];  Service: Cardiovascular;  Laterality: N/A;  . CYSTOSCOPY W/ RETROGRADES Bilateral 12/25/2018   Procedure: CYSTOSCOPY WITH RETROGRADE PYELOGRAM;  Surgeon: Hollice Espy, MD;  Location: [facility];  Service: Urology;  Laterality: Bilateral;  . FRACTURE SURGERY Right 1958   arm and left wrist compound fracture, no metal  . HERNIA REPAIR Right    inguinial  . LEFT HEART CATH AND CORONARY ANGIOGRAPHY N/A 08/27/2017   Procedure: LEFT HEART CATH AND CORONARY ANGIOGRAPHY;  Surgeon: Wellington Hampshire, MD;  Location: [place] [facility];  Service: Cardiovascular;  Laterality: N/A;  . TRANSURETHRAL RESECTION OF BLADDER TUMOR WITH MITOMYCIN-C N/A 12/25/2018   Procedure: TRANSURETHRAL RESECTION OF BLADDER TUMOR WITH Gemcitabine;  Surgeon: Hollice Espy, MD;  Location: [facility];  Service: Urology;  Laterality: N/A;    Allergies  No Known Allergies  History of Present Illness    75 year old male with the above past medical history including hypertension, hyperlipidemia, CAD status post CABG, and ischemic cardiomyopathy/HFrEF.  In July 2019, he was admitted with chest pain and new left bundle branch block.  Catheterization showed multivessel disease and following intra-aortic balloon pump placement, he was transferred  to [facility] where he underwent CABG x3.  Intraoperative TEE showed an EF of 30-35%.  Follow-up echocardiogram November 2019 showed improvement in LV function to 45-50%.  In February 2020, he was seen in the emergency department with musculoskeletal chest pain and normal troponins.  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  Plavix was discontinued.  He subsequently underwent transurethral resection of the bladder tumor in November 2020.  Patient was last seen in clinic in November 2021, at which time he was doing well.  He has continued to do well.  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  In the setting of his farm, he remains reasonably active but is not routinely exercising.  He does not experience chest pain or dyspnea and denies palpitations, PND, orthopnea, dizziness, syncope, edema, or early satiety.  Home Medications    Prior to Admission medications   Medication Sig Start Date End Date Taking? Authorizing Provider  acetaminophen (TYLENOL) 650 MG CR tablet Take 650 mg by mouth every 8 (eight) hours as needed for pain.    [provider]  aspirin EC 81 MG EC tablet Take 1 tablet (81 mg total) by mouth daily. 09/03/17   Gold, Wayne E, PA-C  atorvastatin (LIPITOR) 80 MG tablet Take 1 tablet (80 mg total) by mouth daily. 01/02/20   Theora Gianotti, NP  carvedilol (COREG) 12.5 MG tablet Take 1 tablet (12.5 mg total) by mouth 2 (two) times daily with a meal. 01/02/20   Theora Gianotti, NP  losartan (COZAAR) 100 MG tablet Take 1 tablet (100 mg total) by mouth daily. 07/11/20   End, Harrell Gave, MD  Multiple Vitamin (MULTIVITAMIN WITH MINERALS) TABS tablet Take 1 tablet by mouth daily. One a Day over 50/ lunch    [provider]  nitroGLYCERIN (NITROSTAT) 0.4 MG SL tablet Place 1 tablet (0.4 mg total) under the tongue every 5 (five) minutes as needed for chest pain. Maximum of 3 doses. 04/11/19 01/01/29  Theora Gianotti, NP    Review of Systems    He denies chest pain, palpitations, dyspnea, pnd, orthopnea, n, v, dizziness, syncope, edema, weight gain, or early satiety.  All other systems reviewed and are otherwise negative except as noted above.  Physical Exam    VS:  BP 128/60 (BP  Location: Left Arm, Patient Position: Sitting, Cuff Size: Normal)   Pulse 66   Ht 6' (1.829 m)   Wt 245 lb 2 oz (111.2 kg)   SpO2 98%   BMI 33.24 kg/m  , BMI Body mass index is 33.24 kg/m. GEN: Well nourished, well developed, in no acute distress. HEENT: normal. Neck: Supple, no JVD, carotid bruits, or masses. Cardiac: RRR, no murmurs, rubs, or gallops. No clubbing, cyanosis, trace bilateral ankle edema.  Radials 2+ and equal bilaterally.  Respiratory:  Respirations regular and unlabored, clear to auscultation bilaterally. GI: Soft, nontender, nondistended, BS + x 4. MS: no deformity or atrophy. Skin: warm and dry, no rash. Neuro:  Strength and sensation are intact. Psych: Normal affect.  Accessory Clinical Findings    ECG personally reviewed by me today -regular sinus rhythm, 66, left axis deviation, prior septal infarct, IVCD - no acute changes.  Lab Results  Component Value Date   WBC 7.9 12/21/2018   HGB 14.3 12/21/2018   HCT 42.9 12/21/2018   MCV 91.1 12/21/2018  PLT 160 12/21/2018   Lab Results  Component Value Date   CREATININE 1.05 12/21/2018   BUN 10 12/21/2018   NA 140 12/21/2018   K 4.1 12/21/2018   CL 108 12/21/2018   CO2 24 12/21/2018   Lab Results  Component Value Date   ALT 23 11/11/2018   AST 23 11/11/2018   ALKPHOS 55 11/11/2018   BILITOT 1.4 (H) 11/11/2018   Lab Results  Component Value Date   CHOL 104 12/28/2017   HDL 30 (L) 12/28/2017   LDLCALC 58 12/28/2017   LDLDIRECT 58 12/28/2017   TRIG 80 12/28/2017   CHOLHDL 3.5 12/28/2017    Lab Results  Component Value Date   HGBA1C 5.6 08/28/2017    Assessment & Plan    1.  Coronary artery disease status post CABG x3: Status post STEMI in July 2019 followed by emergent CABG x3.  He has continued to do well since his last visit in November of last year.  He remains on aspirin, statin, beta-blocker, and ARB therapy.  2.  Essential hypertension: Blood pressure stable today.  He remains on  beta-blocker and ARB therapy.  He is overdue for labs and orders are in for [facility].  3.  Ischemic cardiomyopathy/HFrEF: Euvolemic on examination with stable weight.  EF 45 to 50% by echo in November 2019.  He remains on beta-blocker and ARB therapy.  He has not required a diuretic.  4.  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  At his last visit he was noted to have a right bundle branch block.  IVCD present today.  No evidence of high-grade heart block.  No history of presyncope or syncope.  5.  Disposition: Patient is overdue for follow-up lab work.  We have orders in for the medical mall for him to obtain at his convenience.  Follow-up in clinic in 6 months or sooner if necessary.  Murray Hodgkins, NP 07/25/2020, 4:10 PM

## 2020-07-25 NOTE — Patient Instructions (Signed)
Medication Instructions:  No changes at this time.  *If you need a refill on your cardiac medications before your next appointment, please call your pharmacy*   Lab Work: None  If you have labs (blood work) drawn today and your tests are completely normal, you will receive your results only by: Marland Kitchen MyChart Message (if you have MyChart) OR . A paper copy in the mail If you have any lab test that is abnormal or we need to change your treatment, we will call you to review the results.   Testing/Procedures: None   Follow-Up: At [facility], you and your health needs are our priority.  As part of our continuing mission to provide you with exceptional heart care, we have created designated Provider Care Teams.  These Care Teams include your primary Cardiologist (physician) and Advanced Practice Providers (APPs -  Physician Assistants and Nurse Practitioners) who all work together to provide you with the care you need, when you need it.  We recommend signing up for the patient portal called "MyChart".  Sign up information is provided on this After Visit Summary.  MyChart is used to connect with patients for Virtual Visits (Telemedicine).  Patients are able to view lab/test results, encounter notes, upcoming appointments, etc.  Non-urgent messages can be sent to your provider as well.   To learn more about what you can do with MyChart, go to [email or web address].    Your next appointment:   6 month(s)  The format for your next appointment:   In Person  Provider:   Nelva Bush, MD or Murray Hodgkins, NP

## 2020-08-07 ENCOUNTER — Other Ambulatory Visit: Payer: Self-pay

## 2020-08-07 ENCOUNTER — Ambulatory Visit
Admission: EM | Admit: 2020-08-07 | Discharge: 2020-08-07 | Disposition: A | Discharge status: Home / Self Care | Payer: Medicare Other | Attending: Physician Assistant | Admitting: Physician Assistant

## 2020-08-07 DIAGNOSIS — U071 COVID-19: Secondary | ICD-10-CM | POA: Insufficient documentation

## 2020-08-07 DIAGNOSIS — R519 Headache, unspecified: Secondary | ICD-10-CM | POA: Insufficient documentation

## 2020-08-07 DIAGNOSIS — R059 Cough, unspecified: Secondary | ICD-10-CM | POA: Diagnosis not present

## 2020-08-07 DIAGNOSIS — Z79899 Other long term (current) drug therapy: Secondary | ICD-10-CM | POA: Diagnosis not present

## 2020-08-07 DIAGNOSIS — Z87891 Personal history of nicotine dependence: Secondary | ICD-10-CM | POA: Insufficient documentation

## 2020-08-07 DIAGNOSIS — Z28310 Unvaccinated for covid-19: Secondary | ICD-10-CM | POA: Insufficient documentation

## 2020-08-07 DIAGNOSIS — B349 Viral infection, unspecified: Secondary | ICD-10-CM | POA: Diagnosis not present

## 2020-08-07 DIAGNOSIS — R5383 Other fatigue: Secondary | ICD-10-CM | POA: Diagnosis not present

## 2020-08-07 MED ORDER — PROMETHAZINE-DM 6.25-15 MG/5ML PO SYRP: 5.0000 mL | ORAL_SOLUTION | Freq: Four times a day (QID) | ORAL | 0 refills | Status: DC | PRN

## 2020-08-07 NOTE — Discharge Instructions (Addendum)

## 2020-08-07 NOTE — ED Triage Notes (Signed)
Pt reports having a cough since last Friday. Also reports having a nagging headache.

## 2020-08-07 NOTE — ED Provider Notes (Signed)
[facility]    CSN: [id number] Arrival date & time: 08/07/20  1411      History   Chief Complaint Chief Complaint  Patient presents with   Cough    HPI Cory Hess is a 75 y.o. male presenting for 6-day history of dry cough.  He also says that he has been a bit fatigued and had a headache.  Patient says that today is his "best day yet."  Patient states that he feels better today than he did at the start.  He says he has not had any fever or body aches.  Denies any nasal congestion, sore throat or breathing issue.  No nausea/vomiting or diarrhea.  Patient denies any sick contacts and says that he lives alone.  He says that he does go to [employer] often and eat inside so he is unsure if he could have come into contact with any illnesses there.  Patient has not been vaccinated for COVID-19.  He says that he has taken Tylenol and NyQuil but no other OTC meds.  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  No other concerns.  HPI  Past Medical History:  Diagnosis Date   Arthritis    neck   Bladder tumor    CAD (coronary artery disease)    a. 08/2017 STEMI/Cath: LM 80ost/d, LAD 100p, LCX 95 ost/p, RCA 60p, 61m, RPDA 80ost; b. 08/2017 CABG x 3: LIMA->LAD, VG->OM, VG->PDA.   Cancer [facility])    bladder tumor   Essential hypertension    HFrEF (heart failure with reduced ejection fraction) ([place])    a. 08/2017 TEE: EF 30-35%, sept/ant/inf HK, apical AK. 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 12/2017 Echo: EF 45-50%, diff HK. Gr1 DD. Mildly reduced RV fxn. Mild RAE.   Hyperlipidemia LDL goal <70    Ischemic cardiomyopathy    a. 08/2017 TEE: EF 30-35%; b. 12/2017 Echo: EF 45-50%.   Myocardial infarction ([place]) 08/27/2017   Shingles    2018 - right side   Vertigo    several months ago    Patient Active Problem List   Diagnosis Date Noted   Hematuria 11/06/2018   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   Coronary artery  disease 08/28/2017   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 [facility])     Past Surgical History:  Procedure Laterality Date   CARDIAC CATHETERIZATION     CATARACT EXTRACTION W/PHACO Left 07/18/2018   Procedure: CATARACT EXTRACTION PHACO AND INTRAOCULAR LENS PLACEMENT ([place])  LEFT;  Surgeon: Eulogio Bear, MD;  Location: [place];  Service: Ophthalmology;  Laterality: Left;   CATARACT EXTRACTION W/PHACO Right 08/14/2018   Procedure: CATARACT EXTRACTION PHACO AND INTRAOCULAR LENS PLACEMENT ([facility])  RIGHT;  Surgeon: Eulogio Bear, MD;  Location: [place];  Service: Ophthalmology;  Laterality: Right;   CORONARY ARTERY BYPASS GRAFT N/A 08/27/2017   Procedure: CORONARY ARTERY BYPASS GRAFTING (CABG) ON PUMP USING LEFT INTERNAL MAMMARY ARTERY AND LEFT GREATER SAPHENOUS VEIN VIA ENDOVEIN HARVEST;  Surgeon: Gaye Pollack, MD;  Location: [place];  Service: Open Heart Surgery;  Laterality: N/A;   CORONARY/GRAFT ACUTE MI REVASCULARIZATION N/A 08/27/2017   Procedure: Coronary/Graft Acute MI Revascularization;  Surgeon: Wellington Hampshire, MD;  Location: [place] [facility];  Service: Cardiovascular;  Laterality: N/A;   CYSTOSCOPY W/ RETROGRADES Bilateral 12/25/2018   Procedure: CYSTOSCOPY WITH RETROGRADE PYELOGRAM;  Surgeon: Hollice Espy, MD;  Location: [facility];  Service: Urology;  Laterality: Bilateral;   FRACTURE SURGERY Right 1958   arm and left wrist compound fracture, no metal   HERNIA REPAIR Right    inguinial   LEFT HEART CATH AND CORONARY ANGIOGRAPHY N/A 08/27/2017   Procedure: LEFT HEART CATH AND CORONARY ANGIOGRAPHY;  Surgeon: Wellington Hampshire, MD;  Location: [place] [facility];  Service: Cardiovascular;  Laterality: N/A;   TRANSURETHRAL RESECTION OF BLADDER TUMOR WITH MITOMYCIN-C N/A 12/25/2018   Procedure: TRANSURETHRAL RESECTION OF BLADDER TUMOR WITH Gemcitabine;  Surgeon: Hollice Espy, MD;  Location: [facility];  Service: Urology;   Laterality: N/A;       Home Medications    Prior to Admission medications   Medication Sig Start Date End Date Taking? Authorizing Provider  promethazine-dextromethorphan (PROMETHAZINE-DM) 6.25-15 MG/5ML syrup Take 5 mLs by mouth 4 (four) times daily as needed for cough. 08/07/20  Yes Danton Clap, PA-C  acetaminophen (TYLENOL) 650 MG CR tablet Take 650 mg by mouth every 8 (eight) hours as needed for pain.    [provider]  aspirin EC 81 MG EC tablet Take 1 tablet (81 mg total) by mouth daily. 09/03/17   Gold, Wayne E, PA-C  atorvastatin (LIPITOR) 80 MG tablet Take 1 tablet (80 mg total) by mouth daily. 01/02/20   Theora Gianotti, NP  carvedilol (COREG) 12.5 MG tablet Take 1 tablet (12.5 mg total) by mouth 2 (two) times daily with a meal. 01/02/20   Theora Gianotti, NP  losartan (COZAAR) 100 MG tablet Take 1 tablet (100 mg total) by mouth daily. 07/11/20   End, Harrell Gave, MD  Multiple Vitamin (MULTIVITAMIN WITH MINERALS) TABS tablet Take 1 tablet by mouth daily. One a Day over 50/ lunch    [provider]  nitroGLYCERIN (NITROSTAT) 0.4 MG SL tablet Place 1 tablet (0.4 mg total) under the tongue every 5 (five) minutes as needed for chest pain. Maximum of 3 doses. 07/25/20 10/23/20  Theora Gianotti, NP    Family History Family History  Problem Relation Age of Onset   Heart failure Mother    Lung disease Father    Stroke Father     Social History Social History   Tobacco Use   Smoking status: Former    Pack years: 0.00    Types: Cigarettes    Quit date: 2000    Years since quitting: 22.4   Smokeless tobacco: Current    Types: Snuff  Vaping Use   Vaping Use: Never used  Substance Use Topics   Alcohol use: Not Currently   Drug use: Not Currently     Allergies   Patient has no known allergies.   Review of Systems Review of Systems  Constitutional:  Positive for fatigue. Negative for fever.  HENT:  Negative for congestion,  rhinorrhea, sinus pressure, sinus pain and sore throat.   Respiratory:  Positive for cough. Negative for shortness of breath.   Cardiovascular:  Negative for chest pain.  Gastrointestinal:  Negative for abdominal pain, diarrhea, nausea and vomiting.  Musculoskeletal:  Negative for myalgias.  Neurological:  Positive for headaches. Negative for weakness and light-headedness.  Hematological:  Negative for adenopathy.    Physical Exam Triage Vital Signs ED Triage Vitals  Enc Vitals Group     BP 08/07/20 1420 124/72     Pulse Rate 08/07/20 1420 83     Resp 08/07/20 1420 18     Temp 08/07/20 1420 98 F (36.7 C)     Temp Source 08/07/20 1420  Oral     SpO2 08/07/20 1420 99 %     Weight 08/07/20 1421 235 lb (106.6 kg)     Height 08/07/20 1421 6' (1.829 m)     Head Circumference --      Peak Flow --      Pain Score 08/07/20 1421 0     Pain Loc --      Pain Edu? --      Excl. in [place]? --    No data found.  Updated Vital Signs BP 124/72   Pulse 83   Temp 98 F (36.7 C) (Oral)   Resp 18   Ht 6' (1.829 m)   Wt 235 lb (106.6 kg)   SpO2 99%   BMI 31.87 kg/m       Physical Exam Vitals and nursing note reviewed.  Constitutional:      General: He is not in acute distress.    Appearance: Normal appearance. He is well-developed. He is not ill-appearing or diaphoretic.  HENT:     Head: Normocephalic and atraumatic.     Nose: Nose normal.     Mouth/Throat:     Mouth: Mucous membranes are moist.     Pharynx: Oropharynx is clear. Uvula midline.     Tonsils: No tonsillar abscesses.  Eyes:     General: No scleral icterus.       Right eye: No discharge.        Left eye: No discharge.     Conjunctiva/sclera: Conjunctivae normal.  Neck:     Thyroid: No thyromegaly.     Trachea: No tracheal deviation.  Cardiovascular:     Rate and Rhythm: Normal rate and regular rhythm.     Heart sounds: Normal heart sounds.  Pulmonary:     Effort: Pulmonary effort is normal. No respiratory  distress.     Breath sounds: Normal breath sounds. No wheezing, rhonchi or rales.  Musculoskeletal:     Cervical back: Neck supple.  Skin:    General: Skin is dry.  Neurological:     General: No focal deficit present.     Mental Status: He is alert. Mental status is at baseline.     Motor: No weakness.  Psychiatric:        Mood and Affect: Mood normal.        Behavior: Behavior normal.        Thought Content: Thought content normal.     UC Treatments / Results  Labs (all labs ordered are listed, but only abnormal results are displayed) Labs Reviewed  SARS CORONAVIRUS 2 (TAT 6-24 HRS)    EKG   Radiology No results found.  Procedures Procedures (including critical care time)  Medications Ordered in UC Medications - No data to display  Initial Impression / Assessment and Plan / UC Course  I have reviewed the triage vital signs and the nursing notes.  Pertinent labs & imaging results that were available during my care of the patient were reviewed by me and considered in my medical decision making (see chart for details).  75 year old male presenting for 6-day history of dry cough, fatigue and headaches.  Patient says that symptoms are not worsening and actually improved.  He states that since he is still having symptoms this long he thought he should still get checked out.  He denies any fever, chest tightness or breathing issue.  No known COVID exposure.  He has not been vaccinated for COVID-19.  Vital signs are all normal and stable.  Patient is overall well-appearing.  Exam is within normal limits.  He has no congestion and his chest is clear to auscultation with regular heart rate and rhythm.  PCR COVID test obtained.  Current CDC guidelines, isolation protocol and ED precautions reviewed with patient.  Advised patient he has a viral illness and symptoms already seem to be improving so I would just have him continue to treat his symptoms with increasing rest and fluids.   I have sent in Promethazine DM since he says he would like to try some different for cough.  Advised him to monitor his symptoms closely and if he develops a worsening cough, fever or has breathing issues or any acute worsening of symptoms that he should be seen again in our department or ER if symptoms are severe.   Final Clinical Impressions(s) / UC Diagnoses   Final diagnoses:  Viral illness  Cough     Discharge Instructions      You have received COVID testing today either for positive exposure, concerning symptoms that could be related to COVID infection, screening purposes, or re-testing after confirmed positive.  Your test obtained today checks for active viral infection in the last 1-2 weeks. If your test is negative now, you can still test positive later. So, if you do develop symptoms you should either get re-tested and/or isolate x 5 days and then strict mask use x 5 days (unvaccinated) or mask use x 10 days (vaccinated). Please follow CDC guidelines.  While Rapid antigen tests come back in 15-20 minutes, send out PCR/molecular test results typically come back within 1-3 days. In the mean time, if you are symptomatic, assume this could be a positive test and treat/monitor yourself as if you do have COVID.   We will call with test results if positive. Please download the MyChart app and set up a profile to access test results.   If symptomatic, go home and rest. Push fluids. Take Tylenol as needed for discomfort. Gargle warm salt water. Throat lozenges. Take Mucinex DM or Robitussin for cough. Humidifier in bedroom to ease coughing. Warm showers. Also review the COVID handout for more information.  COVID-19 INFECTION: The incubation period of COVID-19 is approximately 14 days after exposure, with most symptoms developing in roughly 4-5 days. Symptoms may range in severity from mild to critically severe. Roughly 80% of those infected will have mild symptoms. People of any age may  become infected with COVID-19 and have the ability to transmit the virus. The most common symptoms include: fever, fatigue, cough, body aches, headaches, sore throat, nasal congestion, shortness of breath, nausea, vomiting, diarrhea, changes in smell and/or taste.    COURSE OF ILLNESS Some patients may begin with mild disease which can progress quickly into critical symptoms. If your symptoms are worsening please call ahead to the Emergency Department and proceed there for further treatment. Recovery time appears to be roughly 1-2 weeks for mild symptoms and 3-6 weeks for severe disease.   GO IMMEDIATELY TO ER FOR FEVER YOU ARE UNABLE TO GET DOWN WITH TYLENOL, BREATHING PROBLEMS, CHEST PAIN, FATIGUE, LETHARGY, INABILITY TO EAT OR DRINK, ETC  QUARANTINE AND ISOLATION: To help decrease the spread of COVID-19 please remain isolated if you have COVID infection or are highly suspected to have COVID infection. This means -stay home and isolate to one room in the home if you live with others. Do not share a bed or bathroom with others while ill, sanitize and wipe down all countertops and keep common  areas clean and disinfected. Stay home for 5 days. If you have no symptoms or your symptoms are resolving after 5 days, you can leave your house. Continue to wear a mask around others for 5 additional days. If you have been in close contact (within 6 feet) of someone diagnosed with COVID 19, you are advised to quarantine in your home for 14 days as symptoms can develop anywhere from 2-14 days after exposure to the virus. If you develop symptoms, you  must isolate.  Most current guidelines for COVID after exposure -unvaccinated: isolate 5 days and strict mask use x 5 days. Test on day 5 is possible -vaccinated: wear mask x 10 days if symptoms do not develop -You do not necessarily need to be tested for COVID if you have + exposure and  develop symptoms. Just isolate at home x10 days from symptom onset During this  global pandemic, CDC advises to practice social distancing, try to stay at least 33ft away from others at all times. Wear a face covering. Wash and sanitize your hands regularly and avoid going anywhere that is not necessary.  KEEP IN MIND THAT THE COVID TEST IS NOT 100% ACCURATE AND YOU SHOULD STILL DO EVERYTHING TO PREVENT POTENTIAL SPREAD OF VIRUS TO OTHERS (WEAR MASK, WEAR GLOVES, [place] HANDS AND SANITIZE REGULARLY). IF INITIAL TEST IS NEGATIVE, THIS MAY NOT MEAN YOU ARE DEFINITELY NEGATIVE. MOST ACCURATE TESTING IS DONE 5-7 DAYS AFTER EXPOSURE.   It is not advised by CDC to get re-tested after receiving a positive COVID test since you can still test positive for weeks to months after you have already cleared the virus.   *If you have not been vaccinated for COVID, I strongly suggest you consider getting vaccinated as long as there are no contraindications.     ED Prescriptions     Medication Sig Dispense Auth. Provider   promethazine-dextromethorphan (PROMETHAZINE-DM) 6.25-15 MG/5ML syrup Take 5 mLs by mouth 4 (four) times daily as needed for cough. 118 mL Danton Clap, PA-C      PDMP not reviewed this encounter.   Danton Clap, PA-C 08/07/20 1451

## 2020-08-08 LAB — SARS CORONAVIRUS 2 (TAT 6-24 HRS): SARS Coronavirus 2: POSITIVE — AB

## 2020-09-17 ENCOUNTER — Other Ambulatory Visit: Payer: Self-pay | Admitting: Internal Medicine

## 2020-10-27 DIAGNOSIS — Z20822 Contact with and (suspected) exposure to covid-19: Secondary | ICD-10-CM | POA: Diagnosis not present

## 2020-12-16 ENCOUNTER — Other Ambulatory Visit: Payer: Self-pay | Admitting: Nurse Practitioner

## 2020-12-16 NOTE — Telephone Encounter (Signed)
*  STAT* If patient is at the pharmacy, call can be transferred to refill team.   1. Which medications need to be refilled? (please list name of each medication and dose if known) Carvedilol 12.5 mg po BID   2. Which pharmacy/location (including street and city if local pharmacy) is medication to be sent to?  [facility]  3. Do they need a 30 day or 90 day supply? 90   Patient at pharmacy now

## 2021-01-09 ENCOUNTER — Other Ambulatory Visit: Payer: Self-pay | Admitting: Internal Medicine

## 2021-01-09 NOTE — Telephone Encounter (Signed)
Patient calling to check on status.

## 2021-01-09 NOTE — Telephone Encounter (Signed)
Pt at pharmacy now asking for refill of Losartan 100 mg daily.  Refill sent and scheduling to call pt now to schedule 6 month f/u (from 07/2020) that is in recall.

## 2021-01-09 NOTE — Telephone Encounter (Signed)
Patient scheduled.

## 2021-01-28 ENCOUNTER — Ambulatory Visit: Payer: Medicare Other | Admitting: Internal Medicine

## 2021-02-28 ENCOUNTER — Other Ambulatory Visit: Payer: Self-pay | Admitting: Nurse Practitioner

## 2021-03-02 NOTE — Telephone Encounter (Signed)
This is a [place] pt, Dr. End 

## 2021-03-12 ENCOUNTER — Other Ambulatory Visit: Payer: Self-pay

## 2021-03-12 ENCOUNTER — Encounter: Payer: Self-pay | Admitting: Nurse Practitioner

## 2021-03-12 ENCOUNTER — Ambulatory Visit (INDEPENDENT_AMBULATORY_CARE_PROVIDER_SITE_OTHER): Payer: Medicare Other | Admitting: Nurse Practitioner

## 2021-03-12 VITALS — BP 122/72 | HR 70 | Ht 72.0 in | Wt 249.0 lb

## 2021-03-12 DIAGNOSIS — I255 Ischemic cardiomyopathy: Secondary | ICD-10-CM | POA: Diagnosis not present

## 2021-03-12 DIAGNOSIS — I2510 Atherosclerotic heart disease of native coronary artery without angina pectoris: Secondary | ICD-10-CM

## 2021-03-12 DIAGNOSIS — E785 Hyperlipidemia, unspecified: Secondary | ICD-10-CM | POA: Diagnosis not present

## 2021-03-12 DIAGNOSIS — I454 Nonspecific intraventricular block: Secondary | ICD-10-CM | POA: Diagnosis not present

## 2021-03-12 DIAGNOSIS — I10 Essential (primary) hypertension: Secondary | ICD-10-CM

## 2021-03-12 DIAGNOSIS — I5020 Unspecified systolic (congestive) heart failure: Secondary | ICD-10-CM | POA: Diagnosis not present

## 2021-03-12 MED ORDER — LOSARTAN POTASSIUM 100 MG PO TABS: 100.0000 mg | ORAL_TABLET | Freq: Every day | ORAL | 3 refills | Status: DC

## 2021-03-12 MED ORDER — CARVEDILOL 12.5 MG PO TABS: 12.5000 mg | ORAL_TABLET | Freq: Two times a day (BID) | ORAL | 3 refills | Status: DC

## 2021-03-12 MED ORDER — ATORVASTATIN CALCIUM 80 MG PO TABS: 80.0000 mg | ORAL_TABLET | Freq: Every day | ORAL | 3 refills | Status: DC

## 2021-03-12 NOTE — Progress Notes (Signed)
Office Visit    Patient Name: Cory Hess Date of Encounter: 03/12/2021  Primary Care Provider:  Patient, No Pcp Per (Inactive) Primary Cardiologist:  Nelva Bush, MD  Chief Complaint    76 year old male with a history of CAD status post CABG x3 in July 2019, hypertension, hyperlipidemia, alternating bundle branch block, ischemic cardiomyopathy, and HFrEF (EF 40 to 45% November 2019), who presents for follow-up of CAD and heart failure.  Past Medical History    Past Medical History:  Diagnosis Date   Alternating RBBB & LBBB    Arthritis    neck   Bladder tumor    CAD (coronary artery disease)    a. 08/2017 STEMI/Cath: LM 80ost/d, LAD 100p, LCX 95 ost/p, RCA 60p, 59m, RPDA 80ost; b. 08/2017 CABG x 3: LIMA->LAD, VG->OM, VG->PDA.   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 08/2017 TEE: EF 30-35%, sept/ant/inf HK, apical AK. 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 12/2017 Echo: EF 45-50%, diff HK. Gr1 DD. Mildly reduced RV fxn. Mild RAE.   Hyperlipidemia LDL goal <70    Ischemic cardiomyopathy    a. 08/2017 TEE: EF 30-35%; b. 12/2017 Echo: EF 45-50%.   Myocardial infarction ([place]) 08/27/2017   Shingles    2018 - right side   Vertigo    several months ago   Past Surgical History:  Procedure Laterality Date   CARDIAC CATHETERIZATION     CATARACT EXTRACTION W/PHACO Left 07/18/2018   Procedure: CATARACT EXTRACTION PHACO AND INTRAOCULAR LENS PLACEMENT ([place])  LEFT;  Surgeon: Eulogio Bear, MD;  Location: [place];  Service: Ophthalmology;  Laterality: Left;   CATARACT EXTRACTION W/PHACO Right 08/14/2018   Procedure: CATARACT EXTRACTION PHACO AND INTRAOCULAR LENS PLACEMENT ([facility])  RIGHT;  Surgeon: Eulogio Bear, MD;  Location: [place];  Service: Ophthalmology;  Laterality: Right;   CORONARY ARTERY BYPASS GRAFT N/A 08/27/2017   Procedure: CORONARY ARTERY BYPASS GRAFTING (CABG) ON  PUMP USING LEFT INTERNAL MAMMARY ARTERY AND LEFT GREATER SAPHENOUS VEIN VIA ENDOVEIN HARVEST;  Surgeon: Gaye Pollack, MD;  Location: [place];  Service: Open Heart Surgery;  Laterality: N/A;   CORONARY/GRAFT ACUTE MI REVASCULARIZATION N/A 08/27/2017   Procedure: Coronary/Graft Acute MI Revascularization;  Surgeon: Wellington Hampshire, MD;  Location: [place] CV LAB;  Service: Cardiovascular;  Laterality: N/A;   CYSTOSCOPY W/ RETROGRADES Bilateral 12/25/2018   Procedure: CYSTOSCOPY WITH RETROGRADE PYELOGRAM;  Surgeon: Hollice Espy, MD;  Location: [facility];  Service: Urology;  Laterality: Bilateral;   FRACTURE SURGERY Right 1958   arm and left wrist compound fracture, no metal   HERNIA REPAIR Right    inguinial   LEFT HEART CATH AND CORONARY ANGIOGRAPHY N/A 08/27/2017   Procedure: LEFT HEART CATH AND CORONARY ANGIOGRAPHY;  Surgeon: Wellington Hampshire, MD;  Location: [place] CV LAB;  Service: Cardiovascular;  Laterality: N/A;   TRANSURETHRAL RESECTION OF BLADDER TUMOR WITH MITOMYCIN-C N/A 12/25/2018   Procedure: TRANSURETHRAL RESECTION OF BLADDER TUMOR WITH Gemcitabine;  Surgeon: Hollice Espy, MD;  Location: [facility];  Service: Urology;  Laterality: N/A;    Allergies  No Known Allergies  History of Present Illness    76 year old male with above past medical history including hypertension, hyperlipidemia, CAD status post CABG, alternating bundle branch block, and ischemic cardiomyopathy/HFrEF.  In July 2019, he was admitted with chest pain and new left bundle branch block.  Catheterization showed  multivessel CAD and following intra-aortic balloon pump placement, he was transferred to [facility] where he underwent CABG x3.  Intraoperative TEE showed an EF of 30 to 35%.  Follow-up echo November 2019, showed improvement in LV function to 45 to 50%.  He was maintained on Plavix following his CABG however, this was discontinued in September 2020 in the setting of hematuria and finding of bladder  tumor.  He subsequently underwent TURBT in November 2020.  Mr. 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  He was supposed to have follow-up labs however, these were never performed.  Since his last visit, he has cont to do well.  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  He notes that it is a constant battle preventing his goats from getting worms/parasites.  He denies chest pain, dyspnea, palpitations, PND, orthopnea, dizziness, syncope, edema, or early satiety.    Home Medications    Current Outpatient Medications  Medication Sig Dispense Refill   acetaminophen (TYLENOL) 650 MG CR tablet Take 650 mg by mouth every 8 (eight) hours as needed for pain.     aspirin EC 81 MG EC tablet Take 1 tablet (81 mg total) by mouth daily.     Multiple Vitamin (MULTIVITAMIN WITH MINERALS) TABS tablet Take 1 tablet by mouth daily. One a Day over 50/ lunch     nitroGLYCERIN (NITROSTAT) 0.4 MG SL tablet Place 1 tablet (0.4 mg total) under the tongue every 5 (five) minutes as needed for chest pain. Maximum of 3 doses. 25 tablet 1   atorvastatin (LIPITOR) 80 MG tablet Take 1 tablet (80 mg total) by mouth daily. 90 tablet 3   carvedilol (COREG) 12.5 MG tablet Take 1 tablet (12.5 mg total) by mouth 2 (two) times daily with a meal. 180 tablet 3   losartan (COZAAR) 100 MG tablet Take 1 tablet (100 mg total) by mouth daily. 90 tablet 3   promethazine-dextromethorphan (PROMETHAZINE-DM) 6.25-15 MG/5ML syrup Take 5 mLs by mouth 4 (four) times daily as needed for cough. (Patient not taking: Reported on 03/12/2021) 118 mL 0   No current facility-administered medications for this visit.     Review of Systems    He denies chest pain, palpitations, dyspnea, pnd, orthopnea, n, v, dizziness, syncope, edema, weight gain, or early satiety.  All other systems reviewed and are otherwise negative except as noted  above.    Physical Exam    VS:  BP 122/72 (BP Location: Left Arm, Patient Position: Sitting, Cuff Size: Normal)    Pulse 70    Ht 6' (1.829 m)    Wt 249 lb (112.9 kg)    SpO2 96%    BMI 33.77 kg/m  , BMI Body mass index is 33.77 kg/m.     GEN: Well nourished, well developed, in no acute distress. HEENT: normal. Neck: Supple, no JVD, carotid bruits, or masses. Cardiac: RRR, no murmurs, rubs, or gallops. No clubbing, cyanosis, edema.  Radials 2+/PT 1+ and equal bilaterally.  Respiratory:  Respirations regular and unlabored, clear to auscultation bilaterally. GI: Soft, nontender, nondistended, BS + x 4. MS: no deformity or atrophy. Skin: warm and dry, no rash. Neuro:  Strength and sensation are intact. Psych: Normal affect.  Accessory Clinical Findings    ECG personally reviewed by me today -regular sinus rhythm, 70, first-degree AV block, left axis deviation, right  bundle branch block- no acute changes.  Lab Results  Component Value Date   WBC 7.9 12/21/2018   HGB 14.3 12/21/2018   HCT 42.9 12/21/2018   MCV 91.1 12/21/2018   PLT 160 12/21/2018   Lab Results  Component Value Date   CREATININE 1.05 12/21/2018   BUN 10 12/21/2018   NA 140 12/21/2018   K 4.1 12/21/2018   CL 108 12/21/2018   CO2 24 12/21/2018   Lab Results  Component Value Date   ALT 23 11/11/2018   AST 23 11/11/2018   ALKPHOS 55 11/11/2018   BILITOT 1.4 (H) 11/11/2018   Lab Results  Component Value Date   CHOL 104 12/28/2017   HDL 30 (L) 12/28/2017   LDLCALC 58 12/28/2017   LDLDIRECT 58 12/28/2017   TRIG 80 12/28/2017   CHOLHDL 3.5 12/28/2017    Lab Results  Component Value Date   HGBA1C 5.6 08/28/2017    Assessment & Plan    1.  Coronary artery disease: Status post non-STEMI and CABG x3 in July 2019.  Since his last visit in June 2022, he has continued to do well.  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  ECG stable today with known  right bundle branch block.  He remains on aspirin, statin, beta-blocker, and ARB therapy.  I will follow-up labs today and plan to refill his medications.  2.  Essential hypertension: Blood pressure stable today.  As above, due for refills and labs.  3.  Ischemic cardiomyopathy/HFrEF: EF 45 to 50% by echocardiography in November 2019.  Euvolemic on examination.  He remains on beta-blocker and ARB.  Follow-up complete metabolic panel today.  4.  Alternating bundle branch block: History of IVCD/left bundle branch block, as well as known history of intermittent right bundle branch block.  He does have a first-degree AV block, which has been borderline in the past, but no evidence of high-grade heart block.  Continue to follow.  May need to reduce beta-blocker dose at some point in the future.  5.  Hyperlipidemia: Was supposed to have lipids drawn after his last visit but did not show up for labs.  Advised that we will be able to refill his atorvastatin without follow-up lipids and LFTs and these been  ordered today.  6.  Disposition: Follow-up CBC, complete metabolic panel, and lipids.  Follow-up in clinic in 6 months or sooner if necessary.   Murray Hodgkins, NP 03/12/2021, 2:17 PM

## 2021-03-12 NOTE — Patient Instructions (Signed)
Medication Instructions:  No changes at this time.  *If you need a refill on your cardiac medications before your next appointment, please call your pharmacy*   Lab Work: CBC, CMET, and Lipid panel. These will be fasting labs so nothing to eat or drink after midnight before except sip of water with your medications.   If you have labs (blood work) drawn today and your tests are completely normal, you will receive your results only by: [place] (if you have MyChart) OR A paper copy in the mail If you have any lab test that is abnormal or we need to change your treatment, we will call you to review the results.   Testing/Procedures: None   Follow-Up: At [facility], you and your health needs are our priority.  As part of our continuing mission to provide you with exceptional heart care, we have created designated Provider Care Teams.  These Care Teams include your primary Cardiologist (physician) and Advanced Practice Providers (APPs -  Physician Assistants and Nurse Practitioners) who all work together to provide you with the care you need, when you need it.  We recommend signing up for the patient portal called "MyChart".  Sign up information is provided on this After Visit Summary.  MyChart is used to connect with patients for Virtual Visits (Telemedicine).  Patients are able to view lab/test results, encounter notes, upcoming appointments, etc.  Non-urgent messages can be sent to your provider as well.   To learn more about what you can do with MyChart, go to [email or web address].    Your next appointment:   6 month(s)  The format for your next appointment:   In Person  Provider:   Nelva Bush, MD or Murray Hodgkins, NP

## 2021-03-27 ENCOUNTER — Other Ambulatory Visit: Payer: Medicare Other

## 2021-04-01 ENCOUNTER — Other Ambulatory Visit: Payer: Self-pay

## 2021-04-01 ENCOUNTER — Other Ambulatory Visit (INDEPENDENT_AMBULATORY_CARE_PROVIDER_SITE_OTHER): Payer: Medicare Other

## 2021-04-01 DIAGNOSIS — I5020 Unspecified systolic (congestive) heart failure: Secondary | ICD-10-CM

## 2021-04-01 DIAGNOSIS — I2510 Atherosclerotic heart disease of native coronary artery without angina pectoris: Secondary | ICD-10-CM | POA: Diagnosis not present

## 2021-04-02 ENCOUNTER — Telehealth: Payer: Self-pay | Admitting: Nurse Practitioner

## 2021-04-02 LAB — COMPREHENSIVE METABOLIC PANEL
ALT: 18 IU/L (ref 0–44)
AST: 20 IU/L (ref 0–40)
Albumin/Globulin Ratio: 1.7 (ref 1.2–2.2)
Albumin: 4.2 g/dL (ref 3.7–4.7)
Alkaline Phosphatase: 69 IU/L (ref 44–121)
BUN/Creatinine Ratio: 12 (ref 10–24)
BUN: 13 mg/dL (ref 8–27)
Bilirubin Total: 0.9 mg/dL (ref 0.0–1.2)
CO2: 22 mmol/L (ref 20–29)
Calcium: 9.2 mg/dL (ref 8.6–10.2)
Chloride: 108 mmol/L — ABNORMAL HIGH (ref 96–106)
Creatinine, Ser: 1.1 mg/dL (ref 0.76–1.27)
Globulin, Total: 2.5 g/dL (ref 1.5–4.5)
Glucose: 95 mg/dL (ref 70–99)
Potassium: 4.4 mmol/L (ref 3.5–5.2)
Sodium: 144 mmol/L (ref 134–144)
Total Protein: 6.7 g/dL (ref 6.0–8.5)
eGFR: 70 mL/min/{1.73_m2} (ref >59)

## 2021-04-02 LAB — LIPID PANEL
Chol/HDL Ratio: 3.1 ratio (ref 0.0–5.0)
Cholesterol, Total: 93 mg/dL — ABNORMAL LOW (ref 100–199)
HDL: 30 mg/dL — ABNORMAL LOW (ref >39)
LDL Chol Calc (NIH): 45 mg/dL (ref 0–99)
Triglycerides: 94 mg/dL (ref 0–149)
VLDL Cholesterol Cal: 18 mg/dL (ref 5–40)

## 2021-04-02 LAB — CBC
Hematocrit: 43.4 % (ref 37.5–51.0)
Hemoglobin: 14.6 g/dL (ref 13.0–17.7)
MCH: 31 pg (ref 26.6–33.0)
MCHC: 33.6 g/dL (ref 31.5–35.7)
MCV: 92 fL (ref 79–97)
Platelets: 151 10*3/uL (ref 150–450)
RBC: 4.71 x10E6/uL (ref 4.14–5.80)
RDW: 12.3 % (ref 11.6–15.4)
WBC: 5.9 10*3/uL (ref 3.4–10.8)

## 2021-04-02 NOTE — Telephone Encounter (Signed)
-----   Message from Theora Gianotti, NP sent at 04/02/2021  7:41 AM EST ----- Kidney & liver function and electrolytes look good (chloride minimally elevated).  Blood counts are normal.  Lipids at goal.

## 2021-04-02 NOTE — Telephone Encounter (Signed)
Patient calling to discuss last avs and documented BMI.  He would like to know if his BMI is good or bad and if he is on target.   Please call to discuss.

## 2021-04-02 NOTE — Telephone Encounter (Signed)
No answer. No voicemail. 

## 2021-04-03 NOTE — Telephone Encounter (Signed)
No answer/No voicemail. Mobile number is not valid.

## 2021-04-07 ENCOUNTER — Encounter: Payer: Self-pay | Admitting: *Deleted

## 2021-04-07 NOTE — Telephone Encounter (Signed)
No answer/Call cannot be completed at this time. Will send results via mail.

## 2021-04-08 NOTE — Telephone Encounter (Signed)
Patient returning call.

## 2021-04-08 NOTE — Telephone Encounter (Signed)
Spoke with patient and gave him the lab results as documented below.

## 2021-05-04 DIAGNOSIS — Z20828 Contact with and (suspected) exposure to other viral communicable diseases: Secondary | ICD-10-CM | POA: Diagnosis not present

## 2021-05-19 DIAGNOSIS — Z20822 Contact with and (suspected) exposure to covid-19: Secondary | ICD-10-CM | POA: Diagnosis not present

## 2021-06-29 DIAGNOSIS — Z20822 Contact with and (suspected) exposure to covid-19: Secondary | ICD-10-CM | POA: Diagnosis not present

## 2021-08-24 ENCOUNTER — Other Ambulatory Visit: Payer: Self-pay | Admitting: Nurse Practitioner

## 2021-08-24 NOTE — Telephone Encounter (Signed)
Please contact pt for future appointment. Pt OD  6 month f/u. Pt needing refill.

## 2021-08-26 NOTE — Telephone Encounter (Signed)
Attempted to schedule no ans no vm  

## 2021-08-27 NOTE — Telephone Encounter (Signed)
Pt made an appt with Murray Hodgkins

## 2021-09-08 NOTE — Telephone Encounter (Signed)
Scheduled

## 2021-10-21 ENCOUNTER — Ambulatory Visit: Payer: Medicare Other | Attending: Nurse Practitioner | Admitting: Nurse Practitioner

## 2021-10-21 ENCOUNTER — Encounter: Payer: Self-pay | Admitting: Nurse Practitioner

## 2021-10-21 VITALS — BP 132/68 | HR 66 | Ht 72.0 in | Wt 248.0 lb

## 2021-10-21 DIAGNOSIS — I5020 Unspecified systolic (congestive) heart failure: Secondary | ICD-10-CM | POA: Insufficient documentation

## 2021-10-21 DIAGNOSIS — Z951 Presence of aortocoronary bypass graft: Secondary | ICD-10-CM | POA: Diagnosis not present

## 2021-10-21 DIAGNOSIS — I454 Nonspecific intraventricular block: Secondary | ICD-10-CM | POA: Diagnosis not present

## 2021-10-21 DIAGNOSIS — E785 Hyperlipidemia, unspecified: Secondary | ICD-10-CM | POA: Insufficient documentation

## 2021-10-21 DIAGNOSIS — I10 Essential (primary) hypertension: Secondary | ICD-10-CM | POA: Diagnosis not present

## 2021-10-21 DIAGNOSIS — I2510 Atherosclerotic heart disease of native coronary artery without angina pectoris: Secondary | ICD-10-CM | POA: Diagnosis not present

## 2021-10-21 DIAGNOSIS — I255 Ischemic cardiomyopathy: Secondary | ICD-10-CM | POA: Insufficient documentation

## 2021-10-21 NOTE — Progress Notes (Signed)
Office Visit    Patient Name: Cory Hess Date of Encounter: 10/21/2021  Primary Care Provider:  Patient, No Pcp Per Primary Cardiologist:  Nelva Bush, MD  Chief Complaint    76 year old male with a history of CAD status post CABG x3 in July 2019, hypertension, hyperlipidemia, alternating bundle branch block, ischemic cardiomyopathy, and HFrEF (40-45% November 2019), who presents for follow-up of CAD and heart failure.  Past Medical History    Past Medical History:  Diagnosis Date   Alternating RBBB & LBBB    Arthritis    neck   Bladder tumor    CAD (coronary artery disease)    a. 08/2017 STEMI/Cath: LM 80ost/d, LAD 100p, LCX 95 ost/p, RCA 60p, 23m RPDA 80ost; b. 08/2017 CABG x 3: LIMA->LAD, VG->OM, VG->PDA.   Cancer ([facility]    bladder tumor   Essential hypertension    HFrEF (heart failure with reduced [place] fraction) [place]    a. 08/2017 TEE: EF 30-35%, sept/ant/inf HK, apical AK. 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 12/2017 Echo: EF 45-50%, diff HK. Gr1 DD. Mildly reduced RV fxn. Mild RAE.   Hyperlipidemia LDL goal <70    Ischemic cardiomyopathy    a. 08/2017 TEE: EF 30-35%; b. 12/2017 Echo: EF 45-50%.   [place]ocardial infarction ([place] 08/[place]/2017   Shingles    2018 - right side   Vertigo    several months ago   Past Surgical History:  Procedure Laterality Date   CARDIAC CATHETERIZATION     CATARACT EXTRACTION W/PHACO Left 07/18/2018   Procedure: CATARACT EXTRACTION PHACO AND INTRAOCULAR LENS PLACEMENT ([place]  LEFT;  Surgeon: KEulogio Bear MD;  [place]  Service: Ophthalmology;  Laterality: Left;   CATARACT EXTRACTION W/PHACO Right 08/14/2018   Procedure: CATARACT EXTRACTION PHACO AND INTRAOCULAR LENS PLACEMENT ([facility])  RIGHT;  Surgeon: KEulogio Bear MD;  [place]  Service: Ophthalmology;  Laterality: Right;   CORONARY ARTERY BYPASS GRAFT N/A 08/27/2017   Procedure: CORONARY ARTERY BYPASS GRAFTING (CABG) ON PUMP USING LEFT  INTERNAL MAMMARY ARTERY AND LEFT GREATER SAPHENOUS VEIN VIA ENDOVEIN HARVEST;  Surgeon: BGaye Pollack MD;  [place]  Service: Open Heart Surgery;  Laterality: N/A;   CORONARY/GRAFT ACUTE MI REVASCULARIZATION N/A 08/27/2017   Procedure: Coronary/Graft Acute MI Revascularization;  Surgeon: AWellington Hampshire MD;  [place];  Service: Cardiovascular;  Laterality: N/A;   CYSTOSCOPY W/ RETROGRADES Bilateral 12/25/2018   Procedure: CYSTOSCOPY WITH RETROGRADE PYELOGRAM;  Surgeon: BHollice Espy MD;  Location: [facility];  Service: Urology;  Laterality: Bilateral;   FRACTURE SURGERY Right 1958   arm and left wrist compound fracture, no metal   HERNIA REPAIR Right    inguinial   LEFT HEART CATH AND CORONARY ANGIOGRAPHY N/A 08/27/2017   Procedure: LEFT HEART CATH AND CORONARY ANGIOGRAPHY;  Surgeon: AWellington Hampshire MD;  [place];  Service: Cardiovascular;  Laterality: N/A;   TRANSURETHRAL RESECTION OF BLADDER TUMOR WITH MITOMYCIN-C N/A 12/25/2018   Procedure: TRANSURETHRAL RESECTION OF BLADDER TUMOR WITH Gemcitabine;  Surgeon: BHollice Espy MD;  Location: [facility];  Service: Urology;  Laterality: N/A;    Allergies  No Known Allergies  History of Present Illness    76year old male with above past medical history including hypertension, hyperlipidemia, CAD status post CABG, alternating bundle branch block, and ischemic cardiomyopathy/HFrEF.  In July 2019, he was admitted with chest pain and new left bundle branch block.  Catheterization showed multivessel CAD and following  intra-aortic balloon pump he has been, he was transferred to [facility], where he underwent CABG x3.  Intraoperative TEE showed an EF of 30 to 35%.  Follow-up echo November 2019, showed improvement in LV function to 45-50%.  He was maintained on Plavix following his CABG however, this was discontinued in September 2020 in the setting of hematuria and finding of bladder tumor.  He  subsequently underwent TURBT in November 2020.  Cory Hess was last seen in cardiology clinic in January 2023, at which time he was doing well.  He had follow-up labs in February 2023 which showed stable CBC, renal function, electrolytes, and an LDL of 45. Today he presents for follow-up. He is doing very well and denies any cardiac complaints. He does not weigh himself daily and does not check his blood pressure. Said he accidentally took two tablets of Losartan yesterday (total of 200 mg) and denies any dizziness, lightheadedness, syncope, or presyncope. Tolerating rest of medications well. Denies any CP, shortness of breath, palpitations, orthopnea, PND, melena, hematuria, hemoptysis, or claudication.   Home Medications    Current Outpatient Medications  Medication Sig Dispense Refill   acetaminophen (TYLENOL) 650 MG CR tablet Take 650 mg by mouth every 8 (eight) hours as needed for pain.     aspirin EC 81 MG EC tablet Take 1 tablet (81 mg total) by mouth daily.     atorvastatin (LIPITOR) 80 MG tablet Take 1 tablet (80 mg total) by mouth daily. 90 tablet 3   carvedilol (COREG) 12.5 MG tablet Take 1 tablet (12.5 mg total) by mouth 2 (two) times daily with a meal. 180 tablet 3   losartan (COZAAR) 100 MG tablet Take 1 tablet (100 mg total) by mouth daily. 90 tablet 3   Multiple Vitamin (MULTIVITAMIN WITH MINERALS) TABS tablet Take 1 tablet by mouth daily. One a Day over 50/ lunch     nitroGLYCERIN (NITROSTAT) 0.4 MG SL tablet DISSOLVE ONE TABLET UNDER THE TONGUE EVERY 5 MINUTES AS NEEDED FOR CHEST PAIN.  DO NOT EXCEED A TOTAL OF 3 DOSES IN 15 MINUTES 25 tablet 0   No current facility-administered medications for this visit.     Review of Systems     All other systems reviewed and are otherwise negative except as noted above.    Physical Exam    VS:  BP 132/68 (BP Location: Left Arm, Patient Position: Sitting, Cuff Size: Normal)   Pulse 66   Ht 6' (1.829 m)   Wt 248 lb (112.5 kg)   SpO2  98%   BMI 33.63 kg/m  , BMI Body mass index is 33.63 kg/m.     GEN: Well nourished, well developed, in no acute distress. HEENT: normal. Neck: Supple, no JVD, carotid bruits, or masses. Cardiac: RRR, no murmurs, rubs, or gallops. No clubbing, cyanosis, trace, dependent edema along BLE.  Radials 2+/PT 1+ and equal bilaterally.  Respiratory:  Respirations regular and unlabored, clear to auscultation bilaterally. GI: Soft, nontender, nondistended, BS + x 4. MS: no deformity or atrophy. Skin: warm and dry, no rash. Neuro:  Strength and sensation are intact. Psych: Normal affect.  Accessory Clinical Findings    ECG personally reviewed by me today - NSR, 66 bpm, RBB, left axis deviation, otherwise, no acute changes.  Lab Results  Component Value Date   WBC 5.9 04/01/2021   HGB 14.6 04/01/2021   HCT 43.4 04/01/2021   MCV 92 04/01/2021   PLT 151 04/01/2021   Lab Results  Component Value  Date   CREATININE 1.10 04/01/2021   BUN 13 04/01/2021   NA 144 04/01/2021   K 4.4 04/01/2021   CL 108 (H) 04/01/2021   CO2 22 04/01/2021   Lab Results  Component Value Date   ALT 18 04/01/2021   AST 20 04/01/2021   ALKPHOS 69 04/01/2021   BILITOT 0.9 04/01/2021   Lab Results  Component Value Date   CHOL 93 (L) 04/01/2021   HDL 30 (L) 04/01/2021   LDLCALC 45 04/01/2021   LDLDIRECT 58 12/28/2017   TRIG 94 04/01/2021   CHOLHDL 3.1 04/01/2021    Lab Results  Component Value Date   HGBA1C 5.6 08/28/2017    Assessment & Plan    1.  CAD, s/p CABG X 3 in 2019, HLD with LDL < 70 - chronic, stable Stable with no anginal symptoms. No indication for ischemic evaluation. Last LDL 45 03/2021.  Continue ASA, Lipitor, Coreg, and Losartan daily and NG PRN.   2. HFrEF, Ischemic Cardiomyopathy - chronic, stable LVEF 40-45% on 12/2017. Euvolemic and well compensated on exam. Low sodium diet, fluid restriction <2L, and daily weights encouraged. Educated to contact our office for weight gain of 2 lbs  overnight or 5 lbs in one week. Continue current medication regimen. Advised to wear compression stockings during the day.  3. HTN - chronic, stable BP on exam, 132/68. Does not own cuff or check BP.  Advised to obtain Omron cuff. Discussed to monitor BP at home at least 2 hours after medications and sitting for 5-10 minutes. Discussed to send me MyChart readings in 2 weeks via MyChart. Will obtain BMET today.  4. BBB - chronic, stab Hx of alternating BBB. 12 lead EKG today reveals RBBB, similar to last OV. Will continue to monitor.   5. Disposition:  F/U in 6 months or sooner if anything changes.   Finis Bud, NP 10/21/2021, 9:42 PM

## 2021-10-21 NOTE — Patient Instructions (Signed)
Medication Instructions:   NONE  *If you need a refill on your cardiac medications before your next appointment, please call your pharmacy*   Lab Work:  Your physician recommends that you return for lab work at [facility] today.  -BMP  If you have labs (blood work) drawn today and your tests are completely normal, you will receive your results only by: [place] (if you have MyChart) OR A paper copy in the mail If you have any lab test that is abnormal or we need to change your treatment, we will call you to review the results.   Testing/Procedures:  NONE   Follow-Up: At [facility], you and your health needs are our priority.  As part of our continuing mission to provide you with exceptional heart care, we have created designated Provider Care Teams.  These Care Teams include your primary Cardiologist (physician) and Advanced Practice Providers (APPs -  Physician Assistants and Nurse Practitioners) who all work together to provide you with the care you need, when you need it.  We recommend signing up for the patient portal called "MyChart".  Sign up information is provided on this After Visit Summary.  MyChart is used to connect with patients for Virtual Visits (Telemedicine).  Patients are able to view lab/test results, encounter notes, upcoming appointments, etc.  Non-urgent messages can be sent to your provider as well.   To learn more about what you can do with MyChart, go to [email or web address].    Your next appointment:   6 month(s)  The format for your next appointment:   In Person  Provider:   You will see one of the following Advanced Practice Providers on your designated Care Team:   Murray Hodgkins, NP Christell Faith, PA-C Cadence Kathlen Mody, PA-C Gerrie Nordmann, NP  Other Instructions  Please keep a Blood pressure log of your blood pressures:  Check your blood pressure once daily for 2 weeks.  You may consider an Omron Blood pressure cuff or  blood pressure cuff of choice.  Important Information About Sugar

## 2021-12-06 ENCOUNTER — Encounter: Payer: Self-pay | Admitting: Emergency Medicine

## 2021-12-06 ENCOUNTER — Ambulatory Visit
Admission: EM | Admit: 2021-12-06 | Discharge: 2021-12-06 | Disposition: A | Discharge status: Home / Self Care | Payer: Medicare Other | Attending: Emergency Medicine | Admitting: Emergency Medicine

## 2021-12-06 DIAGNOSIS — R42 Dizziness and giddiness: Secondary | ICD-10-CM | POA: Diagnosis not present

## 2021-12-06 NOTE — ED Triage Notes (Signed)
Patient c/o dizziness that has been off and on for 2-3 days.  Patient denies any pain.

## 2021-12-06 NOTE — Discharge Instructions (Addendum)
Your EKG showed a is being in normal pace and rhythm, there is a first-degree AV block which is no change from a previous EKG that you had in January of this year  There are no abnormalities to your neurological exam that would indicate a more serious cause such as a stroke  Therefore we will move forward with treatment of your dizziness   change positions slowly, waiting at least 10 seconds between movements to help the body stabilize   Ensure adequate intake of water as dehydration will make your symptoms worse  May Attempt to complete the following exercises below to see if they will help minimize your dizziness  -Shake: Turn your head from right to left and back again 10 times in 10 seconds. Twist your head round as far as it will go comfortably when you do this, and look in the direction your head is pointing. Wait 10 seconds after you have done 10 complete turns, then do 10 more.  -Nod: Nod your head up and down and back again 10 times in 10 seconds. Tip your head as far as it will go comfortably when you do this, and look in the direction your head is pointing. Wait 10 seconds after you have done 10 complete turns, then do 10 more.  -Shake, eyes closed (EC): Carry out the shake exercise with your eyes closed. Wait 10 seconds after you have done 10 complete turns, then do 10 more.  -Nod, eyes closed (EC): Carry out the nod exercise with your eyes closed. Wait 10 seconds after you have done 10 complete turns, then do 10 more.  -Shake/stare: Hold your finger pointing upwards in front of you and carry out the shake exercise while staring at your finger. Do not let your eyes move from your finger. Wait 10 seconds after you have done 10 complete turns, then do 10 more.  -Nod/stare: Hold your finger pointing sideways in front of you and carry out the nod exercise while staring at your finger. Do not let your eyes move from your finger. Wait 10 seconds after you have done 10 complete turns, then  do 10 more.  Schedule follow-up appointment with your primary doctor in 1 week

## 2021-12-06 NOTE — ED Provider Notes (Signed)
[facility]    CSN: [id number] Arrival date & time: 12/06/21  1526      History   Chief Complaint Chief Complaint  Patient presents with   Dizziness    HPI Cory Hess is a 76 y.o. male.   Patient presents with intermittent dizziness occurring for 2 to 3 days.  Dizziness is described as feeling unbalanced and lightheaded.  Endorses that he has had to pull over while driving and while at grocery store he had to use a scooter.  Baseline is walking with cane.  Has attempted use of spirometry which has been ineffective.  History of vertigo but endorses that this feels different as he is not experiencing associated nausea.  Denies syncope, memory or speech changes, weakness, numbness or tingling, chest pain, shortness of breath.  History of a bundle branch block, cardiomyopathy, heart failure, CAD, MI.   Past Medical History:  Diagnosis Date   Alternating RBBB & LBBB    Arthritis    neck   Bladder tumor    CAD (coronary artery disease)    a. 08/2017 STEMI/Cath: LM 80ost/d, LAD 100p, LCX 95 ost/p, RCA 60p, 99m RPDA 80ost; b. 08/2017 CABG x 3: LIMA->LAD, VG->OM, VG->PDA.   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 08/2017 TEE: EF 30-35%, sept/ant/inf HK, apical AK. 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 12/2017 Echo: EF 45-50%, diff HK. Gr1 DD. Mildly reduced RV fxn. Mild RAE.   Hyperlipidemia LDL goal <70    Ischemic cardiomyopathy    a. 08/2017 TEE: EF 30-35%; b. 12/2017 Echo: EF 45-50%.   Myocardial infarction ([place] 08/27/2017   Shingles    2018 - right side   Vertigo    several months ago    Patient Active Problem List   Diagnosis Date Noted   Hematuria 11/06/2018   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   Coronary artery disease 08/28/2017   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 ([facility]     Past Surgical History:  Procedure Laterality Date   CARDIAC CATHETERIZATION      CATARACT EXTRACTION W/PHACO Left 07/18/2018   Procedure: CATARACT EXTRACTION PHACO AND INTRAOCULAR LENS PLACEMENT ([place]  LEFT;  Surgeon: KEulogio Bear MD;  [place]  Service: Ophthalmology;  Laterality: Left;   CATARACT EXTRACTION W/PHACO Right 08/14/2018   Procedure: CATARACT EXTRACTION PHACO AND INTRAOCULAR LENS PLACEMENT (IOC)  RIGHT;  Surgeon: KEulogio Bear MD;  [place]  Service: Ophthalmology;  Laterality: Right;   CORONARY ARTERY BYPASS GRAFT N/A 08/27/2017   Procedure: CORONARY ARTERY BYPASS GRAFTING (CABG) ON PUMP USING LEFT INTERNAL MAMMARY ARTERY AND LEFT GREATER SAPHENOUS VEIN VIA ENDOVEIN HARVEST;  Surgeon: BGaye Pollack MD;  [place]  Service: Open Heart Surgery;  Laterality: N/A;   CORONARY/GRAFT ACUTE MI REVASCULARIZATION N/A 08/27/2017   Procedure: Coronary/Graft Acute MI Revascularization;  Surgeon: AWellington Hampshire MD;  [place];  Service: Cardiovascular;  Laterality: N/A;   CYSTOSCOPY W/ RETROGRADES Bilateral 12/25/2018   Procedure: CYSTOSCOPY WITH RETROGRADE PYELOGRAM;  Surgeon: BHollice Espy MD;  Location: [facility];  Service: Urology;  Laterality: Bilateral;   FRACTURE SURGERY Right 1958   arm and left wrist compound fracture, no metal   HERNIA REPAIR Right    inguinial   LEFT HEART CATH AND CORONARY ANGIOGRAPHY N/A 08/27/2017   Procedure: LEFT HEART CATH AND CORONARY  ANGIOGRAPHY;  Surgeon: Wellington Hampshire, MD;  Location: [place] [facility];  Service: Cardiovascular;  Laterality: N/A;   TRANSURETHRAL RESECTION OF BLADDER TUMOR WITH MITOMYCIN-C N/A 12/25/2018   Procedure: TRANSURETHRAL RESECTION OF BLADDER TUMOR WITH Gemcitabine;  Surgeon: Hollice Espy, MD;  Location: [facility];  Service: Urology;  Laterality: N/A;       Home Medications    Prior to Admission medications   Medication Sig Start Date End Date Taking? Authorizing Provider  acetaminophen (TYLENOL) 650 MG CR tablet  Take 650 mg by mouth every 8 (eight) hours as needed for pain.    [provider]  aspirin EC 81 MG EC tablet Take 1 tablet (81 mg total) by mouth daily. 09/03/17   Gold, Wayne E, PA-C  atorvastatin (LIPITOR) 80 MG tablet Take 1 tablet (80 mg total) by mouth daily. 03/12/21   Theora Gianotti, NP  carvedilol (COREG) 12.5 MG tablet Take 1 tablet (12.5 mg total) by mouth 2 (two) times daily with a meal. 03/12/21   Theora Gianotti, NP  losartan (COZAAR) 100 MG tablet Take 1 tablet (100 mg total) by mouth daily. 03/12/21   Theora Gianotti, NP  Multiple Vitamin (MULTIVITAMIN WITH MINERALS) TABS tablet Take 1 tablet by mouth daily. One a Day over 50/ lunch    [provider]  nitroGLYCERIN (NITROSTAT) 0.4 MG SL tablet DISSOLVE ONE TABLET UNDER THE TONGUE EVERY 5 MINUTES AS NEEDED FOR CHEST PAIN.  DO NOT EXCEED A TOTAL OF 3 DOSES IN 15 MINUTES 08/27/21   Theora Gianotti, NP    Family History Family History  Problem Relation Age of Onset   Heart failure Mother    Lung disease Father    Stroke Father     Social History Social History   Tobacco Use   Smoking status: Former    Types: Cigarettes    Quit date: 2000    Years since quitting: 23.8   Smokeless tobacco: Current    Types: Snuff  Vaping Use   Vaping Use: Never used  Substance Use Topics   Alcohol use: Not Currently   Drug use: Not Currently     Allergies   Patient has no known allergies.   Review of Systems Review of Systems  Constitutional: Negative.   Respiratory: Negative.    Cardiovascular: Negative.   Skin: Negative.   Neurological:  Positive for dizziness. Negative for tremors, seizures, syncope, facial asymmetry, speech difficulty, weakness, light-headedness, numbness and headaches.     Physical Exam Triage Vital Signs ED Triage Vitals  Enc Vitals Group     BP 12/06/21 1546 (!) 158/83     Pulse Rate 12/06/21 1546 68     Resp 12/06/21 1546 15     Temp  12/06/21 1546 98 F (36.7 C)     Temp Source 12/06/21 1546 Oral     SpO2 12/06/21 1546 100 %     Weight 12/06/21 1545 248 lb 0.3 oz (112.5 kg)     Height 12/06/21 1545 6' (1.829 m)     Head Circumference --      Peak Flow --      Pain Score 12/06/21 1544 0     Pain Loc --      Pain Edu? --      Excl. in [place]? --    No data found.  Updated Vital Signs BP (!) 158/83 (BP Location: Left Arm)   Pulse 68   Temp 98 F (36.7 C) (Oral)  Resp 15   Ht 6' (1.829 m)   Wt 248 lb 0.3 oz (112.5 kg)   SpO2 100%   BMI 33.64 kg/m   Visual Acuity Right Eye Distance:   Left Eye Distance:   Bilateral Distance:    Right Eye Near:   Left Eye Near:    Bilateral Near:     Physical Exam Constitutional:      Appearance: Normal appearance.  HENT:     Head: Normocephalic.     Right Ear: Tympanic membrane, ear canal and external ear normal.     Left Ear: Tympanic membrane, ear canal and external ear normal.  Eyes:     Extraocular Movements: Extraocular movements intact.  Cardiovascular:     Rate and Rhythm: Normal rate and regular rhythm.     Pulses: Normal pulses.     Heart sounds: Normal heart sounds.  Pulmonary:     Effort: Pulmonary effort is normal.     Breath sounds: Normal breath sounds.  Skin:    General: Skin is warm and dry.  Neurological:     General: No focal deficit present.     Mental Status: He is alert and oriented to person, place, and time. Mental status is at baseline.     Cranial Nerves: No cranial nerve deficit.     Motor: No weakness.  Psychiatric:        Mood and Affect: Mood normal.        Behavior: Behavior normal.      UC Treatments / Results  Labs (all labs ordered are listed, but only abnormal results are displayed) Labs Reviewed - No data to display  EKG   Radiology No results found.  Procedures Procedures (including critical care time)  Medications Ordered in UC Medications - No data to display  Initial Impression / Assessment and Plan  / UC Course  I have reviewed the triage vital signs and the nursing notes.  Pertinent labs & imaging results that were available during my care of the patient were reviewed by me and considered in my medical decision making (see chart for details).  Dizziness   EKG shows normal sinus rhythm with a first-degree AV block, no change in comparison to EKG completed by cardiologist in January 2023, no abnormalities to the neurological exam, low suspicion for brain involvement, no abnormalities to the bilateral ears, discussed with patient, dizziness is most likely a flare of vertigo, patient has declined use of meclizine, recommended changing positions slowly, adequate fluid intake and rest for additional supportive measures, given written handout of exercises that may be completed to attempt to stabilize dizziness, given precautions to follow-up if symptoms worsen or persist Final Clinical Impressions(s) / UC Diagnoses   Final diagnoses:  Dizziness     Discharge Instructions      Your EKG showed  There are no abnormalities to your neurological exam  Therefore we will move forward with treatment of your dizziness  You may use meclizine 3 times daily as needed (every 8 hours), be mindful this medication may make you drowsy, take first dosage at home, if helpful but not resolving symptoms you may attempt use of 50 mg (2 tablets)  You may use Zofran every 8 hours to help minimize nausea, place tablet underneath tongue and allow to dissolve, wait 30 minutes to hour before emptying to eat or drink  Continue to change positions slowly, waiting at least 10 seconds between movements to help the body stabilize   Ensure adequate intake of  water as dehydration will make your symptoms worse  May Attempt to complete the following exercises below to see if they will help minimize your dizziness  -Shake: Turn your head from right to left and back again 10 times in 10 seconds. Twist your head round as  far as it will go comfortably when you do this, and look in the direction your head is pointing. Wait 10 seconds after you have done 10 complete turns, then do 10 more.  -Nod: Nod your head up and down and back again 10 times in 10 seconds. Tip your head as far as it will go comfortably when you do this, and look in the direction your head is pointing. Wait 10 seconds after you have done 10 complete turns, then do 10 more.  -Shake, eyes closed (EC): Carry out the shake exercise with your eyes closed. Wait 10 seconds after you have done 10 complete turns, then do 10 more.  -Nod, eyes closed (EC): Carry out the nod exercise with your eyes closed. Wait 10 seconds after you have done 10 complete turns, then do 10 more.  -Shake/stare: Hold your finger pointing upwards in front of you and carry out the shake exercise while staring at your finger. Do not let your eyes move from your finger. Wait 10 seconds after you have done 10 complete turns, then do 10 more.  -Nod/stare: Hold your finger pointing sideways in front of you and carry out the nod exercise while staring at your finger. Do not let your eyes move from your finger. Wait 10 seconds after you have done 10 complete turns, then do 10 more.  Schedule follow-up appointment with your primary doctor in 1 week     ED Prescriptions   None    PDMP not reviewed this encounter.   Hans Eden, NP 12/06/21 [phone]

## 2022-02-22 DIAGNOSIS — M25473 Effusion, unspecified ankle: Secondary | ICD-10-CM

## 2022-02-22 HISTORY — DX: Effusion, unspecified ankle: M25.473

## 2022-03-09 ENCOUNTER — Other Ambulatory Visit: Payer: Self-pay | Admitting: Nurse Practitioner

## 2022-03-09 NOTE — Telephone Encounter (Signed)
Please schedule 6 month F/U appointment for 90 day refills. Thank you! 

## 2022-03-09 NOTE — Telephone Encounter (Signed)
LMOV

## 2022-04-08 ENCOUNTER — Ambulatory Visit: Payer: Medicare Other | Attending: Nurse Practitioner | Admitting: Nurse Practitioner

## 2022-04-08 ENCOUNTER — Encounter: Payer: Self-pay | Admitting: Nurse Practitioner

## 2022-04-08 VITALS — BP 134/74 | HR 75 | Ht 72.0 in | Wt 251.6 lb

## 2022-04-08 DIAGNOSIS — I110 Hypertensive heart disease with heart failure: Secondary | ICD-10-CM | POA: Insufficient documentation

## 2022-04-08 DIAGNOSIS — I10 Essential (primary) hypertension: Secondary | ICD-10-CM | POA: Diagnosis not present

## 2022-04-08 DIAGNOSIS — I454 Nonspecific intraventricular block: Secondary | ICD-10-CM | POA: Diagnosis not present

## 2022-04-08 DIAGNOSIS — I2510 Atherosclerotic heart disease of native coronary artery without angina pectoris: Secondary | ICD-10-CM | POA: Diagnosis not present

## 2022-04-08 DIAGNOSIS — E785 Hyperlipidemia, unspecified: Secondary | ICD-10-CM

## 2022-04-08 DIAGNOSIS — R9431 Abnormal electrocardiogram [ECG] [EKG]: Secondary | ICD-10-CM | POA: Diagnosis not present

## 2022-04-08 DIAGNOSIS — I440 Atrioventricular block, first degree: Secondary | ICD-10-CM | POA: Insufficient documentation

## 2022-04-08 DIAGNOSIS — I255 Ischemic cardiomyopathy: Secondary | ICD-10-CM

## 2022-04-08 DIAGNOSIS — I5020 Unspecified systolic (congestive) heart failure: Secondary | ICD-10-CM | POA: Insufficient documentation

## 2022-04-08 NOTE — Patient Instructions (Signed)
Medication Instructions:   Your physician recommends that you continue on your current medications as directed. Please refer to the Current Medication list given to you today.        *If you need a refill on your cardiac medications before your next appointment, please call your pharmacy*   Lab Work:  NONE  If you have labs (blood work) drawn today and your tests are completely normal, you will receive your results only by: [place] (if you have MyChart) OR A paper copy in the mail If you have any lab test that is abnormal or we need to change your treatment, we will call you to review the results.   Testing/Procedures:  NONE   Follow-Up: At [facility], you and your health needs are our priority.  As part of our continuing mission to provide you with exceptional heart care, we have created designated Provider Care Teams.  These Care Teams include your primary Cardiologist (physician) and Advanced Practice Providers (APPs -  Physician Assistants and Nurse Practitioners) who all work together to provide you with the care you need, when you need it.  We recommend signing up for the patient portal called "MyChart".  Sign up information is provided on this After Visit Summary.  MyChart is used to connect with patients for Virtual Visits (Telemedicine).  Patients are able to view lab/test results, encounter notes, upcoming appointments, etc.  Non-urgent messages can be sent to your provider as well.   To learn more about what you can do with MyChart, go to [email or web address].    Your next appointment:   6 month(s)  Provider:   Nelva Bush, MD

## 2022-04-08 NOTE — Progress Notes (Signed)
Office Visit    Patient Name: Cory Hess Date of Encounter: 04/08/2022  Primary Care Provider:  Patient, No Pcp Per Primary Cardiologist:  Nelva Bush, MD  Chief Complaint    77 year old male with history of CAD status post CABG x 3 in July 2018, hypertension, hyperlipidemia, alternating bundle branch block, ischemic cardiomyopathy, and HFrEF, who presents for CAD and heart failure follow-up.  Past Medical History    Past Medical History:  Diagnosis Date   Alternating RBBB & LBBB    Arthritis    neck   Bladder tumor    CAD (coronary artery disease)    a. 08/2017 STEMI/Cath: LM 80ost/d, LAD 100p, LCX 95 ost/p, RCA 60p, 39m RPDA 80ost; b. 08/2017 CABG x 3: LIMA->LAD, VG->OM, VG->PDA.   Can[facility] ([facility]    bladder tumor   Essential hypertension    [place]    a. 08/2017 TEE: EF 30-35%, sept/ant/inf HK, apical AK. 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 12/2017 Echo: EF 45-50%, diff HK. Gr1 DD. Mildly reduced RV fxn. Mild RAE.   Hyperlipidemia LDL goal <70    Ischemic cardiomyopathy    a. 08/2017 TEE: EF 30-35%; b. 12/2017 Echo: EF 45-50%.   Myocardial infarction ([place] 08/27/2017   Shingles    2018 - right side   Vertigo    several months ago   Past Surgical History:  Procedure Laterality Date   CARDIAC CATHETERIZATION     CATARACT EXTRACTION W/PHACO Left 07/18/2018   Procedure: CATARACT EXTRACTION PHACO AND INTRAOCULAR LENS PLACEMENT ([place]  LEFT;  Surgeon: KEulogio Bear MD;  [place]  Service: Ophthalmology;  Laterality: Left;   CATARACT EXTRACTION W/PHACO Right 08/14/2018   Procedure: CATARACT EXTRACTION PHACO AND INTRAOCULAR LENS PLACEMENT (IOC)  RIGHT;  Surgeon: KEulogio Bear MD;  [place]  Service: Ophthalmology;  Laterality: Right;   CORONARY ARTERY BYPASS GRAFT N/A 08/27/2017   Procedure: CORONARY ARTERY BYPASS GRAFTING (CABG) ON PUMP USING LEFT INTERNAL MAMMARY ARTERY AND  LEFT GREATER SAPHENOUS VEIN VIA ENDOVEIN HARVEST;  Surgeon: BGaye Pollack MD;  [place]  Service: Open Heart Surgery;  Laterality: N/A;   CORONARY/GRAFT ACUTE MI REVASCULARIZATION N/A 08/27/2017   Procedure: Coronary/Graft Acute MI Revascularization;  Surgeon: AWellington Hampshire MD;  [place];  Service: Cardiovascular;  Laterality: N/A;   CYSTOSCOPY W/ RETROGRADES Bilateral 12/25/2018   Procedure: CYSTOSCOPY WITH RETROGRADE PYELOGRAM;  Surgeon: BHollice Espy MD;  Location: [facility];  Service: Urology;  Laterality: Bilateral;   FRACTURE SURGERY Right 1958   arm and left wrist compound fracture, no metal   HERNIA REPAIR Right    inguinial   LEFT HEART CATH AND CORONARY ANGIOGRAPHY N/A 08/27/2017   Procedure: LEFT HEART CATH AND CORONARY ANGIOGRAPHY;  Surgeon: AWellington Hampshire MD;  [place];  Service: Cardiovascular;  Laterality: N/A;   TRANSURETHRAL RESECTION OF BLADDER TUMOR WITH MITOMYCIN-C N/A 12/25/2018   Procedure: TRANSURETHRAL RESECTION OF BLADDER TUMOR WITH Gemcitabine;  Surgeon: BHollice Espy MD;  Location: [facility];  Service: Urology;  Laterality: N/A;    Allergies  No Known Allergies  History of Present Illness    77year old male with above past medical history including hypertension, hyperlipidemia, CAD, alternating bundle branch block, and ischemic cardiomyopathy/HFrEF.  In July 2019, he was admitted with chest pain and new left bundle branch block.  Catheterization showed multivessel CAD and following intra-aortic balloon pump placement, he was transferred  to [facility] underwent CABG x 3.  Intraoperative TEE showed an EF of 30 to 35%.  Follow-up echo November 2019, showed improvement in LV function to 45-50%.  He was maintained on Plavix following his CABG however, this was discontinued in September 2020 in the setting of hematuria and finding of bladder tumor.  He subsequently underwent TURBT in November 2020.  Mr. Adona  was last seen in cardiology clinic in August 2023, at which time he was doing well.  In October 2023, sooner in the emergency department dizziness which was felt to represent vertigo.  He was treated with meclizine and Zofran.  Since then, Mr. Dufrene has done well.  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  He does not experience chest pain and denies dyspnea, palpitations, PND, orthopnea, dizziness, syncope, or early satiety.  Sometimes, at the end of the day, he might note mild swelling above his socks but this is not typically bothersome.  Home Medications    Current Outpatient Medications  Medication Sig Dispense Refill   acetaminophen (TYLENOL) 650 MG CR tablet Take 650 mg by mouth every 8 (eight) hours as needed for pain.     aspirin EC 81 MG EC tablet Take 1 tablet (81 mg total) by mouth daily.     atorvastatin (LIPITOR) 80 MG tablet Take 1 tablet (80 mg total) by mouth daily. 90 tablet 3   carvedilol (COREG) 12.5 MG tablet TAKE 1 TABLET BY MOUTH TWICE DAILY WITH A MEAL 180 tablet 0   losartan (COZAAR) 100 MG tablet Take 1 tablet by mouth once daily 90 tablet 0   Multiple Vitamin (MULTIVITAMIN WITH MINERALS) TABS tablet Take 1 tablet by mouth daily. One a Day over 50/ lunch     nitroGLYCERIN (NITROSTAT) 0.4 MG SL tablet DISSOLVE ONE TABLET UNDER THE TONGUE EVERY 5 MINUTES AS NEEDED FOR CHEST PAIN.  DO NOT EXCEED A TOTAL OF 3 DOSES IN 15 MINUTES 25 tablet 0   No current facility-administered medications for this visit.     Review of Systems    Occas ankle swelling at the end of the day.  He denies chest pain, palpitations, dyspnea, pnd, orthopnea, n, v, dizziness, syncope, weight gain, or early satiety.  All other systems reviewed and are otherwise negative except as noted above.    Physical Exam    VS:  BP (!) 150/70 (BP Location: Left Arm, Patient Position: Sitting, Cuff Size: Large)   Pulse 75   Ht 6' (1.829 m)   Wt 251 lb 9.6 oz (114.1 kg)   SpO2 96%   BMI 34.12 kg/m  , BMI Body mass index is 34.12  kg/m.     Vitals:   04/08/22 1446 04/08/22 1602  BP: (!) 150/70 134/74  Pulse: 75   SpO2: 96%     GEN: Well nourished, well developed, in no acute distress. HEENT: normal. Neck: Supple, no JVD, carotid bruits, or masses. Cardiac: RRR, no murmurs, rubs, or gallops. No clubbing, cyanosis, edema.  Radials 2+/PT 1+ and equal bilaterally.  Respiratory:  Respirations regular and unlabored, clear to auscultation bilaterally. GI: Soft, nontender, nondistended, BS + x 4. MS: no deformity or atrophy. Skin: warm and dry, no rash. Neuro:  Strength and sensation are intact. Psych: Normal affect.  Accessory Clinical Findings    ECG personally reviewed by me today - RSR, 76, LAD, RBBB, septal infarct - no acute changes.  Lab Results  Component Value Date   WBC 5.9 04/01/2021   HGB 14.6 04/01/2021   HCT  43.4 04/01/2021   MCV 92 04/01/2021   PLT 151 04/01/2021   Lab Results  Component Value Date   CREATININE 1.10 04/01/2021   BUN 13 04/01/2021   NA 144 04/01/2021   K 4.4 04/01/2021   CL 108 (H) 04/01/2021   CO2 22 04/01/2021   Lab Results  Component Value Date   ALT 18 04/01/2021   AST 20 04/01/2021   ALKPHOS 69 04/01/2021   BILITOT 0.9 04/01/2021   Lab Results  Component Value Date   CHOL 93 (L) 04/01/2021   HDL 30 (L) 04/01/2021   LDLCALC 45 04/01/2021   LDLDIRECT 58 12/28/2017   TRIG 94 04/01/2021   CHOLHDL 3.1 04/01/2021    Lab Results  Component Value Date   HGBA1C 5.6 08/28/2017    Assessment & Plan    1.  Coronary artery disease: Status post non-STEMI and CABG x 3 in July 2019.  He has been doing well without chest pain or dyspnea.  He remains on aspirin, statin, beta-blocker, and ARB therapy.  2.  Essential hypertension: Blood pressure initially elevated at 150/70 however on repeat, this improved to 134/74.  Continue beta-blocker and ARB.  Stable lab work earlier this month.  3.  Ischemic cardiomyopathy/chronic heart failure with midrange ejection fraction:  EF 45% by echo November 2019.  Euvolemic on examination.  Remains on beta-blocker and ARB.  Stable lab work earlier this month.  4.  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  He also has a first-degree AV block.  No evidence of high-grade heart block.  May need to reduce beta-blocker at some point in the future but currently stable.  5.  Hyperlipidemia: LDL of 45 in February 2023 with normal LFTs at that time.  He is not fasting at this time.  Will place orders for follow-up in the future.  Continue atorvastatin.  6.  Disposition: Follow-up in clinic in 6 months or sooner if necessary.   Murray Hodgkins, NP 04/08/2022, 4:02 PM

## 2022-05-13 ENCOUNTER — Other Ambulatory Visit: Payer: Self-pay | Admitting: Nurse Practitioner

## 2022-06-09 ENCOUNTER — Other Ambulatory Visit: Payer: Self-pay | Admitting: Nurse Practitioner

## 2022-06-17 ENCOUNTER — Ambulatory Visit
Admission: EM | Admit: 2022-06-17 | Discharge: 2022-06-17 | Disposition: A | Discharge status: Home / Self Care | Payer: Medicare Other | Attending: Physician Assistant | Admitting: Physician Assistant

## 2022-06-17 DIAGNOSIS — R221 Localized swelling, mass and lump, neck: Secondary | ICD-10-CM

## 2022-06-17 MED ORDER — AMOXICILLIN 500 MG PO CAPS: 500.0000 mg | ORAL_CAPSULE | Freq: Two times a day (BID) | ORAL | 0 refills | Status: AC

## 2022-06-17 NOTE — ED Provider Notes (Signed)
[facility]    CSN: [id number] Arrival date & time: 06/17/22  1401      History   Chief Complaint Chief Complaint  Patient presents with   Cyst    HPI Cory Hess is a 77 y.o. male.   Patient is a 77 year old male who presents with complaint of a knot on the left side of his neck that he states that has been there about 2 months.  Patient reports some minimal pain to the touch (2-3 out of 10).  Patient states he can occasionally feel it while he is swallowing and may cause a little discomfort while swallowing.  Patient states he first felt it after having some dental work done 2 to 3 months ago.  Patient states he had an upper plate and a partial lower plate placed.  Patient states he had some pain initially after the lower plate was placed but states that has resolved at this point. patient denies any drainage or fever    Past Medical History:  Diagnosis Date   Alternating RBBB & LBBB    Arthritis    neck   Bladder tumor    CAD (coronary artery disease)    a. 08/2017 STEMI/Cath: LM 80ost/d, LAD 100p, LCX 95 ost/p, RCA 60p, 57m, RPDA 80ost; b. 08/2017 CABG x 3: LIMA->LAD, VG->OM, VG->PDA.   Cancer    bladder tumor   Essential hypertension    HFrEF (heart failure with reduced ejection fraction)    a. 08/2017 TEE: EF 30-35%, sept/ant/inf HK, apical AK. 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 12/2017 Echo: EF 45-50%, diff HK. Gr1 DD. Mildly reduced RV fxn. Mild RAE.   Hyperlipidemia LDL goal <70    Ischemic cardiomyopathy    a. 08/2017 TEE: EF 30-35%; b. 12/2017 Echo: EF 45-50%.   Myocardial infarction 08/27/2017   Shingles    2018 - right side   Vertigo    several months ago    Patient Active Problem List   Diagnosis Date Noted   Hematuria 11/06/2018   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   Coronary artery disease 08/28/2017   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     Past Surgical History:  Procedure Laterality Date   CARDIAC CATHETERIZATION      CATARACT EXTRACTION W/PHACO Left 07/18/2018   Procedure: CATARACT EXTRACTION PHACO AND INTRAOCULAR LENS PLACEMENT (IOC)  LEFT;  Surgeon: Nevada Crane, MD;  Location: [facility];  Service: Ophthalmology;  Laterality: Left;   CATARACT EXTRACTION W/PHACO Right 08/14/2018   Procedure: CATARACT EXTRACTION PHACO AND INTRAOCULAR LENS PLACEMENT (IOC)  RIGHT;  Surgeon: Nevada Crane, MD;  Location: [facility];  Service: Ophthalmology;  Laterality: Right;   CORONARY ARTERY BYPASS GRAFT N/A 08/27/2017   Procedure: CORONARY ARTERY BYPASS GRAFTING (CABG) ON PUMP USING LEFT INTERNAL MAMMARY ARTERY AND LEFT GREATER SAPHENOUS VEIN VIA ENDOVEIN HARVEST;  Surgeon: Alleen Borne, MD;  Location: [facility] OR;  Service: Open Heart Surgery;  Laterality: N/A;   CORONARY/GRAFT ACUTE MI REVASCULARIZATION N/A 08/27/2017   Procedure: Coronary/Graft Acute MI Revascularization;  Surgeon: Iran Ouch, MD;  Location: [facility];  Service: Cardiovascular;  Laterality: N/A;   CYSTOSCOPY W/ RETROGRADES Bilateral 12/25/2018   Procedure: CYSTOSCOPY WITH RETROGRADE PYELOGRAM;  Surgeon: Vanna Scotland, MD;  Location: [facility];  Service: Urology;  Laterality: Bilateral;   FRACTURE SURGERY Right 1958   arm and left wrist compound fracture, no metal   HERNIA REPAIR Right    inguinial  LEFT HEART CATH AND CORONARY ANGIOGRAPHY N/A 08/27/2017   Procedure: LEFT HEART CATH AND CORONARY ANGIOGRAPHY;  Surgeon: Iran Ouch, MD;  Location: [facility];  Service: Cardiovascular;  Laterality: N/A;   TRANSURETHRAL RESECTION OF BLADDER TUMOR WITH MITOMYCIN-C N/A 12/25/2018   Procedure: TRANSURETHRAL RESECTION OF BLADDER TUMOR WITH Gemcitabine;  Surgeon: Vanna Scotland, MD;  Location: [facility];  Service: Urology;  Laterality: N/A;       Home Medications    Prior to Admission medications   Medication Sig Start Date End Date Taking? Authorizing Provider  acetaminophen (TYLENOL) 650 MG CR tablet  Take 650 mg by mouth every 8 (eight) hours as needed for pain.   Yes [provider]  amoxicillin (AMOXIL) 500 MG capsule Take 1 capsule (500 mg total) by mouth 2 (two) times daily for 5 days. 06/17/22 06/22/22 Yes Candis Schatz, PA-C  aspirin EC 81 MG EC tablet Take 1 tablet (81 mg total) by mouth daily. 09/03/17  Yes Gold, Glenice Laine, PA-C  atorvastatin (LIPITOR) 80 MG tablet Take 1 tablet by mouth once daily 05/14/22  Yes Creig Hines, NP  carvedilol (COREG) 12.5 MG tablet TAKE 1 TABLET BY MOUTH TWICE DAILY WITH A MEAL 06/09/22  Yes Creig Hines, NP  losartan (COZAAR) 100 MG tablet Take 1 tablet by mouth once daily 06/09/22  Yes Creig Hines, NP  Multiple Vitamin (MULTIVITAMIN WITH MINERALS) TABS tablet Take 1 tablet by mouth daily. One a Day over 50/ lunch   Yes [provider]  nitroGLYCERIN (NITROSTAT) 0.4 MG SL tablet DISSOLVE ONE TABLET UNDER THE TONGUE EVERY 5 MINUTES AS NEEDED FOR CHEST PAIN.  DO NOT EXCEED A TOTAL OF 3 DOSES IN 15 MINUTES 08/27/21  Yes Creig Hines, NP    Family History Family History  Problem Relation Age of Onset   Heart failure Mother    Lung disease Father    Stroke Father     Social History Social History   Tobacco Use   Smoking status: Former    Types: Cigarettes    Quit date: 2000    Years since quitting: 24.3   Smokeless tobacco: Current    Types: Snuff  Vaping Use   Vaping Use: Never used  Substance Use Topics   Alcohol use: Not Currently   Drug use: Not Currently     Allergies   Patient has no known allergies.   Review of Systems Review of Systems as noted in HPI.  Other systems reviewed and found to be negative   Physical Exam Triage Vital Signs ED Triage Vitals  Enc Vitals Group     BP 06/17/22 1413 (!) 167/85     Pulse Rate 06/17/22 1413 83     Resp 06/17/22 1413 16     Temp 06/17/22 1413 98.8 F (37.1 C)     Temp Source 06/17/22 1413 Oral     SpO2 06/17/22  1413 94 %     Weight 06/17/22 1412 250 lb (113.4 kg)     Height 06/17/22 1412 6' (1.829 m)     Head Circumference --      Peak Flow --      Pain Score 06/17/22 1415 0     Pain Loc --      Pain Edu? --      Excl. in GC? --    No data found.  Updated Vital Signs BP (!) 167/85 (BP Location: Left Arm)   Pulse 83   Temp  98.8 F (37.1 C) (Oral)   Resp 16   Ht 6' (1.829 m)   Wt 250 lb (113.4 kg)   SpO2 94%   BMI 33.91 kg/m   Visual Acuity Right Eye Distance:   Left Eye Distance:   Bilateral Distance:    Right Eye Near:   Left Eye Near:    Bilateral Near:     Physical Exam Constitutional:      Appearance: Normal appearance.  HENT:     Mouth/Throat:     Mouth: Mucous membranes are moist. No injury or oral lesions.  Neck:      Comments: Small nodule noted on the right side of the neck.  Patient states it is not associated with the developing skin tag on the skin surface.  Some minimal tenderness to palpation Musculoskeletal:     Cervical back: Normal range of motion and neck supple. No edema or erythema.  Neurological:     Mental Status: He is alert.      UC Treatments / Results  Labs (all labs ordered are listed, but only abnormal results are displayed) Labs Reviewed - No data to display  EKG   Radiology No results found.  Procedures Procedures (including critical care time)  Medications Ordered in UC Medications - No data to display  Initial Impression / Assessment and Plan / UC Course  I have reviewed the triage vital signs and the nursing notes.  Pertinent labs & imaging results that were available during my care of the patient were reviewed by me and considered in my medical decision making (see chart for details).    Patient presents with complaint of a nodule that he has noted on the left side of the neck has had for about 2 months.  Patient reports some minimal pain intermittently and some intermittent noticing it while he is swallowing.  No  fevers or drainage.  Patient ports initially noted after some dental work but has had no other symptoms related to the dental work.  No lesions noted inside the mouth.  Given the history of coming up after dental work and some tenderness we will going give a short course of antibiotics.  Will have him follow-up with primary care for further evaluation including imaging.  Final Clinical Impressions(s) / UC Diagnoses   Final diagnoses:  Neck nodule     Discharge Instructions      -Amoxicillin: 1 tablet twice a day for 5 days -If no improvement, will need to follow-up with primary care for further evaluation including imaging. -Ibuprofen or Tylenol as needed for pain     ED Prescriptions     Medication Sig Dispense Auth. Provider   amoxicillin (AMOXIL) 500 MG capsule Take 1 capsule (500 mg total) by mouth 2 (two) times daily for 5 days. 10 capsule Candis Schatz, PA-C      PDMP not reviewed this encounter.   Candis Schatz, PA-C 06/17/22 1434

## 2022-06-17 NOTE — ED Triage Notes (Signed)
Pt c/o knot on L side of neck x60 days, states he had some dental work done 2 mon ago & noticed knot after. States he has difficulty with eating & tender to touch. Denies any drainage or fevers.

## 2022-06-17 NOTE — Discharge Instructions (Signed)
-  Amoxicillin: 1 tablet twice a day for 5 days -If no improvement, will need to follow-up with primary care for further evaluation including imaging. -Ibuprofen or Tylenol as needed for pain

## 2022-08-04 ENCOUNTER — Ambulatory Visit
Admission: EM | Admit: 2022-08-04 | Discharge: 2022-08-04 | Disposition: A | Discharge status: Home / Self Care | Payer: Medicare Other | Attending: Family Medicine | Admitting: Family Medicine

## 2022-08-04 DIAGNOSIS — R590 Localized enlarged lymph nodes: Secondary | ICD-10-CM | POA: Diagnosis not present

## 2022-08-04 DIAGNOSIS — I10 Essential (primary) hypertension: Secondary | ICD-10-CM | POA: Insufficient documentation

## 2022-08-04 DIAGNOSIS — J029 Acute pharyngitis, unspecified: Secondary | ICD-10-CM

## 2022-08-04 LAB — GROUP A STREP BY PCR: Group A Strep by PCR: NOT DETECTED

## 2022-08-04 MED ORDER — AMOXICILLIN-POT CLAVULANATE 875-125 MG PO TABS: 1.0000 | ORAL_TABLET | Freq: Two times a day (BID) | ORAL | 0 refills | Status: DC

## 2022-08-04 NOTE — ED Triage Notes (Signed)
Pt c/o sore throat x1 mon. States he has a lump in L side of neck & has been getting larger in size. Was seen back in April for the same issue & was given abx. Hurts to swallow,chew & talk.

## 2022-08-04 NOTE — ED Provider Notes (Addendum)
[facility]    CSN: [id number] Arrival date & time: 08/04/22  1115      History   Chief Complaint Chief Complaint  Patient presents with   Sore Throat   Mass    HPI Cory Hess is a 77 y.o. male.   HPI  History obtained from the patient. Cory Hess presents for sore throat for the past month. Has pain when he is eating. The pain eased off but didn't completely go away. No fever. Has painful swallowing. He gargled with warm salt water which helps an Listerine mouth wash. Denies fever, cough, nasal congestion, vomiting, headache or diarrhea. Pain 0/10.  He has former smoker and quit 24 years ago.   Took antibiotics that were prescribed here before that decreased the size of it. He feels a nodule on the left side of his throat for the past 2-3 months. He doesn't have a regular doctor but sees his cardiologist.        Past Medical History:  Diagnosis Date   Alternating RBBB & LBBB    Arthritis    neck   Bladder tumor    CAD (coronary artery disease)    a. 08/2017 STEMI/Cath: LM 80ost/d, LAD 100p, LCX 95 ost/p, RCA 60p, 58m, RPDA 80ost; b. 08/2017 CABG x 3: LIMA->LAD, VG->OM, VG->PDA.   Cancer [facility])    bladder tumor   Essential hypertension    HFrEF (heart failure with reduced ejection fraction) (HCC)    a. 08/2017 TEE: EF 30-35%, sept/ant/inf HK, apical AK. 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 12/2017 Echo: EF 45-50%, diff HK. Gr1 DD. Mildly reduced RV fxn. Mild RAE.   Hyperlipidemia LDL goal <70    Ischemic cardiomyopathy    a. 08/2017 TEE: EF 30-35%; b. 12/2017 Echo: EF 45-50%.   Myocardial infarction (HCC) 08/27/2017   Shingles    2018 - right side   Vertigo    several months ago    Patient Active Problem List   Diagnosis Date Noted   Hematuria 11/06/2018   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   Coronary artery disease 08/28/2017   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 [facility])     Past Surgical History:  Procedure Laterality Date   CARDIAC  CATHETERIZATION     CATARACT EXTRACTION W/PHACO Left 07/18/2018   Procedure: CATARACT EXTRACTION PHACO AND INTRAOCULAR LENS PLACEMENT (IOC)  LEFT;  Surgeon: Nevada Crane, MD;  Location: [facility];  Service: Ophthalmology;  Laterality: Left;   CATARACT EXTRACTION W/PHACO Right 08/14/2018   Procedure: CATARACT EXTRACTION PHACO AND INTRAOCULAR LENS PLACEMENT (IOC)  RIGHT;  Surgeon: Nevada Crane, MD;  Location: [facility];  Service: Ophthalmology;  Laterality: Right;   CORONARY ARTERY BYPASS GRAFT N/A 08/27/2017   Procedure: CORONARY ARTERY BYPASS GRAFTING (CABG) ON PUMP USING LEFT INTERNAL MAMMARY ARTERY AND LEFT GREATER SAPHENOUS VEIN VIA ENDOVEIN HARVEST;  Surgeon: Alleen Borne, MD;  Location: [facility] OR;  Service: Open Heart Surgery;  Laterality: N/A;   CORONARY/GRAFT ACUTE MI REVASCULARIZATION N/A 08/27/2017   Procedure: Coronary/Graft Acute MI Revascularization;  Surgeon: Iran Ouch, MD;  Location: [facility];  Service: Cardiovascular;  Laterality: N/A;   CYSTOSCOPY W/ RETROGRADES Bilateral 12/25/2018   Procedure: CYSTOSCOPY WITH RETROGRADE PYELOGRAM;  Surgeon: Vanna Scotland, MD;  Location: [facility];  Service: Urology;  Laterality: Bilateral;   FRACTURE SURGERY Right 1958   arm and left wrist compound fracture, no metal   HERNIA REPAIR Right    inguinial  LEFT HEART CATH AND CORONARY ANGIOGRAPHY N/A 08/27/2017   Procedure: LEFT HEART CATH AND CORONARY ANGIOGRAPHY;  Surgeon: Iran Ouch, MD;  Location: [facility];  Service: Cardiovascular;  Laterality: N/A;   TRANSURETHRAL RESECTION OF BLADDER TUMOR WITH MITOMYCIN-C N/A 12/25/2018   Procedure: TRANSURETHRAL RESECTION OF BLADDER TUMOR WITH Gemcitabine;  Surgeon: Vanna Scotland, MD;  Location: [facility];  Service: Urology;  Laterality: N/A;       Home Medications    Prior to Admission medications   Medication Sig Start Date End Date Taking? Authorizing Provider  acetaminophen (TYLENOL)  650 MG CR tablet Take 650 mg by mouth every 8 (eight) hours as needed for pain.   Yes [provider]  amoxicillin-clavulanate (AUGMENTIN) 875-125 MG tablet Take 1 tablet by mouth every 12 (twelve) hours. 08/04/22  Yes Aarin Sparkman, DO  aspirin EC 81 MG EC tablet Take 1 tablet (81 mg total) by mouth daily. 09/03/17  Yes Gold, Glenice Laine, PA-C  atorvastatin (LIPITOR) 80 MG tablet Take 1 tablet by mouth once daily 05/14/22  Yes Creig Hines, NP  carvedilol (COREG) 12.5 MG tablet TAKE 1 TABLET BY MOUTH TWICE DAILY WITH A MEAL 06/09/22  Yes Creig Hines, NP  losartan (COZAAR) 100 MG tablet Take 1 tablet by mouth once daily 06/09/22  Yes Creig Hines, NP  Multiple Vitamin (MULTIVITAMIN WITH MINERALS) TABS tablet Take 1 tablet by mouth daily. One a Day over 50/ lunch   Yes [provider]  nitroGLYCERIN (NITROSTAT) 0.4 MG SL tablet DISSOLVE ONE TABLET UNDER THE TONGUE EVERY 5 MINUTES AS NEEDED FOR CHEST PAIN.  DO NOT EXCEED A TOTAL OF 3 DOSES IN 15 MINUTES 08/27/21  Yes Creig Hines, NP    Family History Family History  Problem Relation Age of Onset   Heart failure Mother    Lung disease Father    Stroke Father     Social History Social History   Tobacco Use   Smoking status: Former    Types: Cigarettes    Quit date: 2000    Years since quitting: 24.4   Smokeless tobacco: Current    Types: Snuff  Vaping Use   Vaping Use: Never used  Substance Use Topics   Alcohol use: Not Currently   Drug use: Not Currently     Allergies   Patient has no known allergies.   Review of Systems Review of Systems: negative unless otherwise stated in HPI.      Physical Exam Triage Vital Signs ED Triage Vitals [08/04/22 1120]  Enc Vitals Group     BP      Pulse      Resp 16     Temp      Temp Source Oral     SpO2      Weight      Height      Head Circumference      Peak Flow      Pain Score      Pain Loc      Pain Edu?       Excl. in GC?    No data found.  Updated Vital Signs BP (!) 154/84 (BP Location: Right Arm)   Pulse 65   Temp 98.6 F (37 C) (Oral)   Resp 16   Ht 6' (1.829 m)   Wt 113.4 kg   SpO2 95%   BMI 33.91 kg/m   Visual Acuity Right Eye Distance:   Left Eye Distance:  Bilateral Distance:    Right Eye Near:   Left Eye Near:    Bilateral Near:     Physical Exam GEN:     alert, well appearing elderly male in no distress    HENT:  mucus membranes moist, dentures present, oropharyngeal without lesions, mild left sided erythema, no tonsillar hypertrophy or exudates, no nasal discharge, bilateral TM normal EYES:   no scleral injection or discharge NECK:  good ROM, right cervical lymphadenopathy, mobile and <2 cm RESP:  no increased work of breathing, clear to auscultation bilaterally CVS:   regular rate and rhythm Skin:   warm and dry, no rash on visible skin    UC Treatments / Results  Labs (all labs ordered are listed, but only abnormal results are displayed) Labs Reviewed  GROUP A STREP BY PCR    EKG   Radiology No results found.  Procedures Procedures (including critical care time)  Medications Ordered in UC Medications - No data to display  Initial Impression / Assessment and Plan / UC Course  I have reviewed the triage vital signs and the nursing notes.  Pertinent labs & imaging results that were available during my care of the patient were reviewed by me and considered in my medical decision making (see chart for details).       Pt is a 76 y.o. male who presents for 1 month sore throat. Cory Hess is an ex-cigarette smoker. Reports left neck pain.  He is afebrile here without recent antipyretics. Satting well on room air. Overall pt is well appearing, well hydrated, without respiratory distress. Pulmonary and HENT exam is unremarkable except for cervical lymphadenopathy.  Strep testing obtained at pt's request and was negative. Trial Augmentin for 7 days.  Given  remote history of smoking, if his symptoms do not resolve or his sx return, recommended ED evaluation for CT soft-tissue Neck. Advised him to get a primary care provider. Discussed symptomatic treatment.  Explained I am unable to rule out neck cancer/lymphoma or neck mass here. Understanding voiced. Typical duration of symptoms discussed.   Cory Hess is hypertensive here.  BP 172/70 then after sitting was 154/84. Previous blood pressures from chart review were  elevated. Follows with cardiologist, recent office note reviewed and BP 150/70 but repeat was 134/74 that day.  Takes losartan and carvedilol..  Denies needing refills. Recommended he check hisblood pressure and follow up with his cardiologist in the next 2 weeks.  Advised that he needs a primary care provider.   Return and ED precautions given and voiced understanding. Discussed MDM, treatment plan and plan for follow-up with patient who agrees with plan.     Final Clinical Impressions(s) / UC Diagnoses   Final diagnoses:  Pharyngitis, unspecified etiology  Lymphadenopathy, cervical  Elevated blood pressure reading with diagnosis of hypertension     Discharge Instructions      It is important that you have a primary care provider.  Someone will contact you with your prescription.   If your neck discomfort does not improve after completing antibiotics or does not completely go away, go to the emergency room/department for imaging of your neck.  I can not rule out neck cancer here at the urgent care.     ED Prescriptions     Medication Sig Dispense Auth. Provider   amoxicillin-clavulanate (AUGMENTIN) 875-125 MG tablet Take 1 tablet by mouth every 12 (twelve) hours. 14 tablet Devonna Oboyle, Seward Meth, DO      PDMP not reviewed this encounter.  Katha Cabal, DO 08/04/22 1420

## 2022-08-04 NOTE — Discharge Instructions (Addendum)
It is important that you have a primary care provider.  Someone will contact you with your prescription.   If your neck discomfort does not improve after completing antibiotics or does not completely go away, go to the emergency room/department for imaging of your neck.  I can not rule out neck cancer here at the urgent care.

## 2022-08-08 ENCOUNTER — Other Ambulatory Visit: Payer: Self-pay | Admitting: Nurse Practitioner

## 2022-08-25 ENCOUNTER — Ambulatory Visit
Admission: EM | Admit: 2022-08-25 | Discharge: 2022-08-25 | Disposition: A | Discharge status: Home / Self Care | Payer: Medicare Other | Attending: Emergency Medicine | Admitting: Emergency Medicine

## 2022-08-25 ENCOUNTER — Ambulatory Visit (INDEPENDENT_AMBULATORY_CARE_PROVIDER_SITE_OTHER): Payer: Medicare Other

## 2022-08-25 ENCOUNTER — Encounter: Payer: Self-pay | Admitting: Emergency Medicine

## 2022-08-25 DIAGNOSIS — C029 Malignant neoplasm of tongue, unspecified: Secondary | ICD-10-CM | POA: Diagnosis not present

## 2022-08-25 DIAGNOSIS — I6523 Occlusion and stenosis of bilateral carotid arteries: Secondary | ICD-10-CM | POA: Diagnosis not present

## 2022-08-25 DIAGNOSIS — R220 Localized swelling, mass and lump, head: Secondary | ICD-10-CM | POA: Diagnosis not present

## 2022-08-25 DIAGNOSIS — C770 Secondary and unspecified malignant neoplasm of lymph nodes of head, face and neck: Secondary | ICD-10-CM | POA: Diagnosis not present

## 2022-08-25 DIAGNOSIS — R221 Localized swelling, mass and lump, neck: Secondary | ICD-10-CM | POA: Diagnosis not present

## 2022-08-25 LAB — COMPREHENSIVE METABOLIC PANEL
ALT: 19 U/L (ref 0–44)
AST: 26 U/L (ref 15–41)
Albumin: 4 g/dL (ref 3.5–5.0)
Alkaline Phosphatase: 53 U/L (ref 38–126)
Anion gap: 5 (ref 5–15)
BUN: 19 mg/dL (ref 8–23)
CO2: 23 mmol/L (ref 22–32)
Calcium: 8.9 mg/dL (ref 8.9–10.3)
Chloride: 111 mmol/L (ref 98–111)
Creatinine, Ser: 1.41 mg/dL — ABNORMAL HIGH (ref 0.61–1.24)
GFR, Estimated: 52 mL/min — ABNORMAL LOW (ref >60)
Glucose, Bld: 110 mg/dL — ABNORMAL HIGH (ref 70–99)
Potassium: 4.1 mmol/L (ref 3.5–5.1)
Sodium: 139 mmol/L (ref 135–145)
Total Bilirubin: 0.9 mg/dL (ref 0.3–1.2)
Total Protein: 7.1 g/dL (ref 6.5–8.1)

## 2022-08-25 LAB — CBC WITH DIFFERENTIAL/PLATELET
Abs Immature Granulocytes: 0.02 10*3/uL (ref 0.00–0.07)
Basophils Absolute: 0.1 10*3/uL (ref 0.0–0.1)
Basophils Relative: 1 %
Eosinophils Absolute: 0.3 10*3/uL (ref 0.0–0.5)
Eosinophils Relative: 4 %
HCT: 41.1 % (ref 39.0–52.0)
Hemoglobin: 14 g/dL (ref 13.0–17.0)
Immature Granulocytes: 0 %
Lymphocytes Relative: 20 %
Lymphs Abs: 1.5 10*3/uL (ref 0.7–4.0)
MCH: 31.5 pg (ref 26.0–34.0)
MCHC: 34.1 g/dL (ref 30.0–36.0)
MCV: 92.6 fL (ref 80.0–100.0)
Monocytes Absolute: 0.7 10*3/uL (ref 0.1–1.0)
Monocytes Relative: 9 %
Neutro Abs: 4.7 10*3/uL (ref 1.7–7.7)
Neutrophils Relative %: 66 %
Platelets: 141 10*3/uL — ABNORMAL LOW (ref 150–400)
RBC: 4.44 MIL/uL (ref 4.22–5.81)
RDW: 12.5 % (ref 11.5–15.5)
WBC: 7.3 10*3/uL (ref 4.0–10.5)
nRBC: 0 % (ref 0.0–0.2)

## 2022-08-25 NOTE — ED Triage Notes (Addendum)
Pt presents with a knot on the left side of his neck for 7 months. Pt states the knot is causing pain in his ear and throat.

## 2022-08-25 NOTE — Discharge Instructions (Addendum)
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  They will contact you and make an appointment.  You need to increase your oral fluid intake so that you increase your urine production and help flush your kidneys given that your creatinine is elevated.  This may be secondary to poor water intake.  The remainder of your electrolytes were normal.  You do need to have your renal function rechecked later this week or the first part of next week.  If you develop any increase in neck pain or pain with swallowing, difficulty breathing, or the inability to swallow fluids you need to go to the ER for evaluation.

## 2022-08-25 NOTE — ED Provider Notes (Signed)
[facility]    CSN: [id number] Arrival date & time: 08/25/22  9147      History   Chief Complaint Chief Complaint  Patient presents with   Cyst    HPI Cory Hess is a 77 y.o. male.   HPI  77 year old male with a past medical history significant for bladder cancer, CAD, essential hypertension, hyperlipidemia, heart failure with reduced ejection fraction, MI, and ischemic cardiomyopathy presents for evaluation of a knot on the left side of his neck.  He was first evaluated for this presentation in April of this year.  At that visit he reports that the knot had been there for approximately 2 months.  At that time the patient was treated with a 5-day course of amoxicillin.  The patient returned to this urgent care on 08/04/2022 with the same complaint and at that time a strep PCR was collected.  The PCR was negative.  The patient was treated with a second round of antibiotics, Augmentin for 7-day course without resolution of symptoms.  The patient is an ex-smoker and he states that he quit approximately 20 years ago.  He was using smokeless tobacco until 1 month ago.  No alcohol consumption.  Past Medical History:  Diagnosis Date   Alternating RBBB & LBBB    Arthritis    neck   Bladder tumor    CAD (coronary artery disease)    a. 08/2017 STEMI/Cath: LM 80ost/d, LAD 100p, LCX 95 ost/p, RCA 60p, 58m, RPDA 80ost; b. 08/2017 CABG x 3: LIMA->LAD, VG->OM, VG->PDA.   Cancer [facility])    bladder tumor   Essential hypertension    HFrEF (heart failure with reduced ejection fraction) (HCC)    a. 08/2017 TEE: EF 30-35%, sept/ant/inf HK, apical AK. 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 12/2017 Echo: EF 45-50%, diff HK. Gr1 DD. Mildly reduced RV fxn. Mild RAE.   Hyperlipidemia LDL goal <70    Ischemic cardiomyopathy    a. 08/2017 TEE: EF 30-35%; b. 12/2017 Echo: EF 45-50%.   Myocardial infarction (HCC) 08/27/2017   Shingles    2018 - right side   Vertigo    several months ago    Patient Active  Problem List   Diagnosis Date Noted   Hematuria 11/06/2018   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   Coronary artery disease 08/28/2017   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 [facility])     Past Surgical History:  Procedure Laterality Date   CARDIAC CATHETERIZATION     CATARACT EXTRACTION W/PHACO Left 07/18/2018   Procedure: CATARACT EXTRACTION PHACO AND INTRAOCULAR LENS PLACEMENT (IOC)  LEFT;  Surgeon: Nevada Crane, MD;  Location: [facility];  Service: Ophthalmology;  Laterality: Left;   CATARACT EXTRACTION W/PHACO Right 08/14/2018   Procedure: CATARACT EXTRACTION PHACO AND INTRAOCULAR LENS PLACEMENT (IOC)  RIGHT;  Surgeon: Nevada Crane, MD;  Location: [facility];  Service: Ophthalmology;  Laterality: Right;   CORONARY ARTERY BYPASS GRAFT N/A 08/27/2017   Procedure: CORONARY ARTERY BYPASS GRAFTING (CABG) ON PUMP USING LEFT INTERNAL MAMMARY ARTERY AND LEFT GREATER SAPHENOUS VEIN VIA ENDOVEIN HARVEST;  Surgeon: Alleen Borne, MD;  Location: [facility] OR;  Service: Open Heart Surgery;  Laterality: N/A;   CORONARY/GRAFT ACUTE MI REVASCULARIZATION N/A 08/27/2017   Procedure: Coronary/Graft Acute MI Revascularization;  Surgeon: Iran Ouch, MD;  Location: [facility];  Service: Cardiovascular;  Laterality: N/A;   CYSTOSCOPY W/ RETROGRADES Bilateral 12/25/2018   Procedure: CYSTOSCOPY WITH RETROGRADE PYELOGRAM;  Surgeon:  Vanna Scotland, MD;  Location: [facility] ORS;  Service: Urology;  Laterality: Bilateral;   FRACTURE SURGERY Right 1958   arm and left wrist compound fracture, no metal   HERNIA REPAIR Right    inguinial   LEFT HEART CATH AND CORONARY ANGIOGRAPHY N/A 08/27/2017   Procedure: LEFT HEART CATH AND CORONARY ANGIOGRAPHY;  Surgeon: Iran Ouch, MD;  Location: [facility] CV [facility];  Service: Cardiovascular;  Laterality: N/A;   TRANSURETHRAL RESECTION OF BLADDER TUMOR WITH MITOMYCIN-C N/A 12/25/2018   Procedure: TRANSURETHRAL RESECTION OF  BLADDER TUMOR WITH Gemcitabine;  Surgeon: Vanna Scotland, MD;  Location: [facility] OR[facility];  Service: Urology;  Laterality: N/A;       Home Medications    Prior to Admission medications   Medication Sig Start Date End Date Taking? Authorizing Provider  acetaminophen (TYLENOL) 650 MG CR tablet Take 650 mg by mouth every 8 (eight) hours as needed for pain.    [provider]  amoxicillin-clavulanate (AUGMENTIN) 875-125 MG tablet Take 1 tablet by mouth every 12 (twelve) hours. 08/04/22   Katha Cabal, DO  aspirin EC 81 MG EC tablet Take 1 tablet (81 mg total) by mouth daily. 09/03/17   Gold, Glenice Laine, PA-C  atorvastatin (LIPITOR) 80 MG tablet Take 1 tablet by mouth once daily 08/09/22   Creig Hines, NP  carvedilol (COREG) 12.5 MG tablet TAKE 1 TABLET BY MOUTH TWICE DAILY WITH A MEAL 06/09/22   Creig Hines, NP  losartan (COZAAR) 100 MG tablet Take 1 tablet by mouth once daily 06/09/22   Creig Hines, NP  Multiple Vitamin (MULTIVITAMIN WITH MINERALS) TABS tablet Take 1 tablet by mouth daily. One a Day over 50/ lunch    [provider]  nitroGLYCERIN (NITROSTAT) 0.4 MG SL tablet DISSOLVE ONE TABLET UNDER THE TONGUE EVERY 5 MINUTES AS NEEDED FOR CHEST PAIN.  DO NOT EXCEED A TOTAL OF 3 DOSES IN 15 MINUTES 08/27/21   Creig Hines, NP    Family History Family History  Problem Relation Age of Onset   Heart failure Mother    Lung disease Father    Stroke Father     Social History Social History   Tobacco Use   Smoking status: Former    Types: Cigarettes    Quit date: 2000    Years since quitting: 24.5   Smokeless tobacco: Current    Types: Snuff  Vaping Use   Vaping Use: Never used  Substance Use Topics   Alcohol use: Not Currently   Drug use: Not Currently     Allergies   Patient has no known allergies.   Review of Systems Review of Systems  Constitutional:  Negative for fever.  HENT:  Positive for sore throat and  trouble swallowing.   Respiratory:  Negative for cough, shortness of breath and stridor.      Physical Exam Triage Vital Signs ED Triage Vitals  Enc Vitals Group     BP      Pulse      Resp      Temp      Temp src      SpO2      Weight      Height      Head Circumference      Peak Flow      Pain Score      Pain Loc      Pain Edu?      Excl. in GC?    No data found.  Updated Vital Signs BP 127/67 (BP Location: Right Arm)   Pulse 70   Temp 98.5 F (36.9 C) (Oral)   Resp 16   SpO2 95%   Visual Acuity Right Eye Distance:   Left Eye Distance:   Bilateral Distance:    Right Eye Near:   Left Eye Near:    Bilateral Near:     Physical Exam Vitals and nursing note reviewed.  Constitutional:      Appearance: Normal appearance. He is not ill-appearing.  HENT:     Head: Normocephalic and atraumatic.     Mouth/Throat:     Mouth: Mucous membranes are moist.     Pharynx: Oropharynx is clear. No oropharyngeal exudate or posterior oropharyngeal erythema.  Neck:     Comments: The patient has a Super Bowl sign is area of edema in the deep tissues of the left side of the anterior neck adjacent to the larynx.  It is nonpulsatile and is not freely mobile.  It does have well circumscribed borders. Musculoskeletal:     Cervical back: Normal range of motion and neck supple. Tenderness present.  Skin:    General: Skin is warm and dry.     Capillary Refill: Capillary refill takes less than 2 seconds.     Findings: No erythema or rash.  Neurological:     General: No focal deficit present.     Mental Status: He is alert and oriented to person, place, and time.      UC Treatments / Results  Labs (all labs ordered are listed, but only abnormal results are displayed) Labs Reviewed  CBC WITH DIFFERENTIAL/PLATELET - Abnormal; Notable for the following components:      Result Value   Platelets 141 (*)    All other components within normal limits  COMPREHENSIVE METABOLIC PANEL -  Abnormal; Notable for the following components:   Glucose, Bld 110 (*)    Creatinine, Ser 1.41 (*)    GFR, Estimated 52 (*)    All other components within normal limits    EKG   Radiology CT Soft Tissue Neck Wo Contrast  Result Date: 08/25/2022 CLINICAL DATA:  Left neck mass noted over the last several weeks. Left tongue and mouth pain. EXAM: CT NECK WITHOUT CONTRAST TECHNIQUE: Multidetector CT imaging of the neck was performed following the standard protocol without intravenous contrast. RADIATION DOSE REDUCTION: This exam was performed according to the departmental dose-optimization program which includes automated exposure control, adjustment of the mA and/or kV according to patient size and/or use of iterative reconstruction technique. COMPARISON:  None Available. 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 No other mucosal or submucosal lesion. Salivary glands: Parotid glands are normal. Left submandibular gland is slightly displaced but does not appear grossly invaded. No clear tissue plane between the mass in the submandibular gland however. Thyroid: Normal Lymph nodes: Metastatic lymphadenopathy at the level 2 station on the left measuring 2.7 x 2.5 x 2.1 cm. Vascular: Atherosclerotic calcification of the carotid bifurcations. Limited intracranial: Normal Visualized orbits: Normal Mastoids and visualized paranasal sinuses: Mild mucosal thickening of the left maxillary sinus floor. Otherwise negative. Skeleton: Ordinary degenerative spondylosis. Upper chest: Lung apices are clear. Other: None IMPRESSION: 1. 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 2. Metastatic lymphadenopathy at the level 2 station on the left measuring 2.7 x 2.5 x 2.1 cm. 3. Atherosclerotic calcification of the carotid bifurcations. Electronically Signed  By: Paulina Fusi M.D.   On: 08/25/2022 10:41     Procedures Procedures (including critical care time)  Medications Ordered in UC Medications - No data to display  Initial Impression / Assessment and Plan / UC Course  I have reviewed the triage vital signs and the nursing notes.  Pertinent labs & imaging results that were available during my care of the patient were reviewed by me and considered in my medical decision making (see chart for details).   Patient is a nontoxic-appearing 77 year old male presenting for evaluation of a painful knot on the left side of his neck as outlined HPI above.  The knot has been there for at least the past 5 months.  The patient is unsure if it is changed in size.  It does cause pain with swallowing but patient denies any difficulty breathing.  He is able to speak in full sentence without dyspnea or tachypnea.  He does have a history of both smoking and smokeless tobacco use.  He has been on 2 separate courses of antibiotics without resolution of symptoms.  I do not appreciate any surrounding cervical lymphadenopathy.  Given patient's tobacco history and protracted duration I am concerned this might be a malignancy.  I will check CBC and CMP as well as order a CT soft tissue neck without contrast as there is no physician at the urgent care today.  CMP shows a mildly elevated glucose of 110 and also elevated creatinine of 1.41.  Sodium, potassium, chloride, BUN, and transaminases are all within normal limits.  Patient's elevated creatinine is an interval increase from last available in epic.  On 04/01/2021 patient's creatinine was 1.10.  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  Metastatic lymphadenopathy at the level 2 station of the left measuring 2.7 x 2.5 x 2.1 cm.  Atherosclerotic calcifications of the carotid bifurcations.  CBC shows mild thrombocytopenia with platelet count of 141 but is  otherwise unremarkable.  I will refer the patient to the oncology clinic for a stat evaluation of his tongue cancer.  He saw urology for his bladder cancer previously but he has not seen anybody in oncology.  He does not currently have a PCP.   Final Clinical Impressions(s) / UC Diagnoses   Final diagnoses:  Tongue cancer [facility])     Discharge Instructions      I have made a referral to the surgical oncology clinic in regards to the tongue cancer that was found on your CT today.  They will contact you and make an appointment.  You need to increase your oral fluid intake so that you increase your urine production and help flush your kidneys given that your creatinine is elevated.  This may be secondary to poor water intake.  The remainder of your electrolytes were normal.  You do need to have your renal function rechecked later this week or the first part of next week.  If you develop any increase in neck pain or pain with swallowing, difficulty breathing, or the inability to swallow fluids you need to go to the ER for evaluation.     ED Prescriptions   None    PDMP not reviewed this encounter.   Becky Augusta, NP 08/25/22 1106

## 2022-08-31 ENCOUNTER — Telehealth: Payer: Self-pay | Admitting: Internal Medicine

## 2022-08-31 NOTE — Telephone Encounter (Signed)
I contacted the patient this morning through the phone number on record.  I informed him about this follow-up arrangements and to expect a call from [facility] ENT office.  Patient is going to be seen in the coming week.  I spoke with Dr. Ernestene Kiel with [facility] ENT.  He recommends patient be seen in the coming week.  He will arrange with his office to reach out to the patient with the scheduled date and time.

## 2022-09-07 ENCOUNTER — Other Ambulatory Visit: Payer: Self-pay | Admitting: Nurse Practitioner

## 2022-09-07 DIAGNOSIS — C109 Malignant neoplasm of oropharynx, unspecified: Secondary | ICD-10-CM | POA: Diagnosis not present

## 2022-09-07 DIAGNOSIS — C770 Secondary and unspecified malignant neoplasm of lymph nodes of head, face and neck: Secondary | ICD-10-CM | POA: Diagnosis not present

## 2022-09-08 ENCOUNTER — Other Ambulatory Visit: Payer: Self-pay | Admitting: Otolaryngology

## 2022-09-08 ENCOUNTER — Telehealth: Payer: Self-pay | Admitting: Radiation Oncology

## 2022-09-08 ENCOUNTER — Encounter: Payer: Self-pay | Admitting: Otolaryngology

## 2022-09-08 DIAGNOSIS — R221 Localized swelling, mass and lump, neck: Secondary | ICD-10-CM

## 2022-09-08 DIAGNOSIS — C109 Malignant neoplasm of oropharynx, unspecified: Secondary | ICD-10-CM

## 2022-09-08 DIAGNOSIS — C770 Secondary and unspecified malignant neoplasm of lymph nodes of head, face and neck: Secondary | ICD-10-CM

## 2022-09-08 NOTE — Telephone Encounter (Signed)
7/17 @ 10:35 am received new referral for this patient, but missing path report and pet/ct scans for dx. According to Dr. Harvie Junior notes stat orders are in.  Called and left voicemail for Dr. Harvie Junior referral coordinator to confirm if PET/CT and [place] Bx already schedule before we can schedule patient for consult with Dr. Basilio Cairo.  Waiting on call back.

## 2022-09-08 NOTE — Progress Notes (Signed)
Oncology Nurse Navigator Documentation   I called and spoke to Mr. Cremer regarding his referral to [place] by Dr. Ernestene Kiel for suspected left base of tongue cancer. Dr. Ernestene Kiel has ordered biopsy and PET scan to be done ASAP. I discussed with Mr. Stepter about the referral and he reported that he lives in [place] and would prefer to receive treatment there instead of in [place]. I have notified Dr. Ernestene Kiel and the schedulers with Radiation and Medical oncology to move the referral to [facility]. Mr. Guillet does have my direct number and I told him that he could call me at anytime if he had questions or concerns.   Hedda Slade RN, BSN, OCN Head & Neck Oncology Nurse Navigator [facility] Phone # [phone]  Fax # [phone]

## 2022-09-10 ENCOUNTER — Inpatient Hospital Stay: Payer: Medicare Other | Attending: Internal Medicine | Admitting: Internal Medicine

## 2022-09-10 ENCOUNTER — Encounter: Payer: Self-pay | Admitting: Internal Medicine

## 2022-09-10 ENCOUNTER — Inpatient Hospital Stay: Payer: Medicare Other

## 2022-09-10 VITALS — BP 155/70 | HR 71 | Temp 97.5°F | Ht 72.0 in | Wt 251.0 lb

## 2022-09-10 DIAGNOSIS — R2689 Other abnormalities of gait and mobility: Secondary | ICD-10-CM | POA: Insufficient documentation

## 2022-09-10 DIAGNOSIS — Z79899 Other long term (current) drug therapy: Secondary | ICD-10-CM | POA: Insufficient documentation

## 2022-09-10 DIAGNOSIS — R5383 Other fatigue: Secondary | ICD-10-CM

## 2022-09-10 DIAGNOSIS — Z7982 Long term (current) use of aspirin: Secondary | ICD-10-CM | POA: Diagnosis not present

## 2022-09-10 DIAGNOSIS — I2510 Atherosclerotic heart disease of native coronary artery without angina pectoris: Secondary | ICD-10-CM | POA: Diagnosis not present

## 2022-09-10 DIAGNOSIS — I130 Hypertensive heart and chronic kidney disease with heart failure and stage 1 through stage 4 chronic kidney disease, or unspecified chronic kidney disease: Secondary | ICD-10-CM | POA: Insufficient documentation

## 2022-09-10 DIAGNOSIS — I25810 Atherosclerosis of coronary artery bypass graft(s) without angina pectoris: Secondary | ICD-10-CM

## 2022-09-10 DIAGNOSIS — H8110 Benign paroxysmal vertigo, unspecified ear: Secondary | ICD-10-CM | POA: Diagnosis not present

## 2022-09-10 DIAGNOSIS — R590 Localized enlarged lymph nodes: Secondary | ICD-10-CM | POA: Diagnosis not present

## 2022-09-10 DIAGNOSIS — M7918 Myalgia, other site: Secondary | ICD-10-CM | POA: Diagnosis not present

## 2022-09-10 DIAGNOSIS — C01 Malignant neoplasm of base of tongue: Secondary | ICD-10-CM | POA: Insufficient documentation

## 2022-09-10 DIAGNOSIS — Z87891 Personal history of nicotine dependence: Secondary | ICD-10-CM | POA: Insufficient documentation

## 2022-09-10 NOTE — Assessment & Plan Note (Addendum)
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  Staging pending.  Recommended PET scan for further evaluation. T-3-4cm; left neck 2.5cm. [T2N1]  # I have ordered an urgent biopsy of the left neck lymph node;  and PET/CT ASAP.   # I reviewed with the patient the option of definitive chemotherapy and radiation therapy concurrently.  Usually the treatments last 6-8 week [ with weekly chemotherapy and radiation therapy offered Monday through Friday.   #  I discussed that surgery would be usually reserved for residual/recurrent malignancy. Discussed that would recommend reimaging with a PET scan 8 to 12 weeks post chemoradiation.  I discussed that the treatments are fairly intensive; but the goal would be cure. Radiation oncology referral.  Will discussed at tumor conference. Discussed with the patient that we will plan to coordinate his treatment along with start of radiation therapy.  # CAD s/p CABG [2019; Dr.End]- repeat 2 D echo.   # Gait instability- sec to BPPV- walks with cane/ monitor for neuropathy-   # Smoking/ Hx of smoking- quit > 25 years ago.   # CKD ? Stage [name] [July 2024]- 53; await repeat labs  Thank you, Dr.Hoshal for allowing me to participate in the care of your pleasant patient. Please do not hesitate to contact me with questions or concerns in the interim.  # DISPOSITION: # 2 d echo re: CAD ASAP # LN biopsy- re: tongue cancer ASAP # Referral to Dr.Crystal re: head and neck cancer # PET ASAP  # labs today- cbc/cmp/mag/  ordered.  # follow up  3-4 days after the Biopsy is done- MD- no labs-  Dr.B

## 2022-09-10 NOTE — Progress Notes (Signed)
[facility] CONSULT NOTE  Patient Care Team: Pcp, No as PCP - General End, Cristal Deer, MD as PCP - Cardiology (Cardiology)  CHIEF COMPLAINTS/PURPOSE OF CONSULTATION: head and neck cancer  #  Oncology History Overview Note  JULY, 2024- CT scan:  1. 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 2. Metastatic lymphadenopathy at the level 2 station on the left measuring 2.7 x 2.5 x 2.1 cm. 3. Atherosclerotic calcification of the carotid bifurcations.        Cancer of base of tongue (HCC)  09/10/2022 Initial Diagnosis   Cancer of base of tongue (HCC)    HISTORY OF PRESENTING ILLNESS: Patient ambulating- with cane.   Alone.   Cory Hess 77 y.o.  male has been referred to [place] for new clinically suspected diagnosis of head and neck cancer.  Patient noted to have swelling neck/ neck pain x  2 months or so. Also complains of mild to moderate pain on swallowing.    This led to a CT scan as above.  Further followed by evaluation with ENT.   Difficulty with speech: none  Difficulty swallowing: yes- in last 2-3 weeks Weight loss: none  History of neuropathy: none  History of kidney disease: none  History of any hearing difficulties:.none   Smoking: remote Alcohol: none.   Review of Systems  Constitutional:  Negative for chills, diaphoresis, fever, malaise/fatigue and weight loss.  HENT:  Negative for nosebleeds and sore throat.   Eyes:  Negative for double vision.  Respiratory:  Negative for cough, hemoptysis, sputum production, shortness of breath and wheezing.   Cardiovascular:  Negative for chest pain, palpitations, orthopnea and leg swelling.  Gastrointestinal:  Negative for abdominal pain, blood in stool, constipation, diarrhea, heartburn, melena, nausea and vomiting.  Genitourinary:  Negative for dysuria, frequency and urgency.  Musculoskeletal:  Negative for back pain and joint pain.  Skin: Negative.  Negative  for itching and rash.  Neurological:  Negative for dizziness, tingling, focal weakness, weakness and headaches.  Endo/Heme/Allergies:  Does not bruise/bleed easily.  Psychiatric/Behavioral:  Negative for depression. The patient is not nervous/anxious and does not have insomnia.      MEDICAL HISTORY:  Past Medical History:  Diagnosis Date   Alternating RBBB & LBBB    Arthritis    neck   Bladder tumor    CAD (coronary artery disease)    a. 08/2017 STEMI/Cath: LM 80ost/d, LAD 100p, LCX 95 ost/p, RCA 60p, 95m, RPDA 80ost; b. 08/2017 CABG x 3: LIMA->LAD, VG->OM, VG->PDA.   Cancer [facility])    bladder tumor   Essential hypertension    HFrEF (heart failure with reduced ejection fraction) (HCC)    a. 08/2017 TEE: EF 30-35%, sept/ant/inf HK, apical AK. 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 12/2017 Echo: EF 45-50%, diff HK. Gr1 DD. Mildly reduced RV fxn. Mild RAE.   Hyperlipidemia LDL goal <70    Ischemic cardiomyopathy    a. 08/2017 TEE: EF 30-35%; b. 12/2017 Echo: EF 45-50%.   Myocardial infarction (HCC) 08/27/2017   Shingles    2018 - right side   Vertigo    several months ago    SURGICAL HISTORY: Past Surgical History:  Procedure Laterality Date   CARDIAC CATHETERIZATION     CATARACT EXTRACTION W/PHACO Left 07/18/2018   Procedure: CATARACT EXTRACTION PHACO AND INTRAOCULAR LENS PLACEMENT (IOC)  LEFT;  Surgeon: Nevada Crane, MD;  Location: [facility];  Service: Ophthalmology;  Laterality: Left;   CATARACT  EXTRACTION W/PHACO Right 08/14/2018   Procedure: CATARACT EXTRACTION PHACO AND INTRAOCULAR LENS PLACEMENT (IOC)  RIGHT;  Surgeon: Nevada Crane, MD;  Location: [facility];  Service: Ophthalmology;  Laterality: Right;   CORONARY ARTERY BYPASS GRAFT N/A 08/27/2017   Procedure: CORONARY ARTERY BYPASS GRAFTING (CABG) ON PUMP USING LEFT INTERNAL MAMMARY ARTERY AND LEFT GREATER SAPHENOUS VEIN VIA ENDOVEIN HARVEST;  Surgeon: Alleen Borne, MD;  Location: [facility] OR;  Service: Open Heart  Surgery;  Laterality: N/A;   CORONARY/GRAFT ACUTE MI REVASCULARIZATION N/A 08/27/2017   Procedure: Coronary/Graft Acute MI Revascularization;  Surgeon: Iran Ouch, MD;  Location: [facility] INVASIVE CV LAB;  Service: Cardiovascular;  Laterality: N/A;   CYSTOSCOPY W/ RETROGRADES Bilateral 12/25/2018   Procedure: CYSTOSCOPY WITH RETROGRADE PYELOGRAM;  Surgeon: Vanna Scotland, MD;  Location: [facility] ORS;  Service: Urology;  Laterality: Bilateral;   FRACTURE SURGERY Right 1958   arm and left wrist compound fracture, no metal   HERNIA REPAIR Right    inguinial   LEFT HEART CATH AND CORONARY ANGIOGRAPHY N/A 08/27/2017   Procedure: LEFT HEART CATH AND CORONARY ANGIOGRAPHY;  Surgeon: Iran Ouch, MD;  Location: [facility] INVASIVE CV LAB;  Service: Cardiovascular;  Laterality: N/A;   TRANSURETHRAL RESECTION OF BLADDER TUMOR WITH MITOMYCIN-C N/A 12/25/2018   Procedure: TRANSURETHRAL RESECTION OF BLADDER TUMOR WITH Gemcitabine;  Surgeon: Vanna Scotland, MD;  Location: [facility] ORS;  Service: Urology;  Laterality: N/A;    SOCIAL HISTORY: Social History   Socioeconomic History   Marital status: Single    Spouse name: Not on file   Number of children: Not on file   Years of education: Not on file   Highest education level: Not on file  Occupational History   Occupation: supervision, Research scientist (life sciences)    Comment: retired  Tobacco Use   Smoking status: Former    Current packs/day: 0.00    Types: Cigarettes    Quit date: 2000    Years since quitting: 24.5   Smokeless tobacco: Current    Types: Snuff  Vaping Use   Vaping status: Never Used  Substance and Sexual Activity   Alcohol use: Not Currently   Drug use: Not Currently   Sexual activity: Not on file  Other Topics Concern   Not on file  Social History Narrative   Lives alone   Social Determinants of Health   Financial Resource Strain: Low Risk  (11/06/2018)   Overall Financial Resource Strain (CARDIA)    Difficulty of Paying Living  Expenses: Not hard at all  Food Insecurity: No Food Insecurity (09/10/2022)   Hunger Vital Sign    Worried About Running Out of Food in the Last Year: Never true    Ran Out of Food in the Last Year: Never true  Transportation Needs: Not on file (09/07/2022)  Physical Activity: Inactive (11/06/2018)   Exercise Vital Sign    Days of Exercise per Week: 0 days    Minutes of Exercise per Session: 0 min  Stress: No Stress Concern Present (11/06/2018)   [employer] of Occupational Health - Occupational Stress Questionnaire    Feeling of Stress : Not at all  Social Connections: Socially Isolated (11/06/2018)   Social Connection and Isolation Panel [NHANES]    Frequency of Communication with Friends and Family: Once a week    Frequency of Social Gatherings with Friends and Family: Once a week    Attends Religious Services: Never    Database administrator or Organizations: No  Attends Banker Meetings: Never    Marital Status: Never married  Intimate Partner Violence: Not At Risk (09/10/2022)   Humiliation, Afraid, Rape, and Kick questionnaire    Fear of Current or Ex-Partner: No    Emotionally Abused: No    Physically Abused: No    Sexually Abused: No    FAMILY HISTORY: Family History  Problem Relation Age of Onset   Heart failure Mother    Lung disease Father    Stroke Father    Cervical cancer Maternal Grandmother     ALLERGIES:  has No Known Allergies.  MEDICATIONS:  Current Outpatient Medications  Medication Sig Dispense Refill   acetaminophen (TYLENOL) 650 MG CR tablet Take 650 mg by mouth every 8 (eight) hours as needed for pain.     aspirin EC 81 MG EC tablet Take 1 tablet (81 mg total) by mouth daily.     atorvastatin (LIPITOR) 80 MG tablet Take 1 tablet by mouth once daily 90 tablet 0   carvedilol (COREG) 12.5 MG tablet Take 1 tablet (12.5 mg total) by mouth 2 (two) times daily with a meal. PLEASE CALL OFFICE TO SCHEDULE APPOINTMENT PRIOR TO NEXT REFILL  180 tablet 0   losartan (COZAAR) 100 MG tablet Take 1 tablet (100 mg total) by mouth daily. PLEASE CALL OFFICE TO SCHEDULE APPOINTMENT PRIOR TO NEXT REFILL 90 tablet 0   Multiple Vitamin (MULTIVITAMIN WITH MINERALS) TABS tablet Take 1 tablet by mouth daily. One a Day over 50/ lunch     nitroGLYCERIN (NITROSTAT) 0.4 MG SL tablet DISSOLVE ONE TABLET UNDER THE TONGUE EVERY 5 MINUTES AS NEEDED FOR CHEST PAIN.  DO NOT EXCEED A TOTAL OF 3 DOSES IN 15 MINUTES 25 tablet 0   No current facility-administered medications for this visit.     PHYSICAL EXAMINATION:   Vitals:   09/10/22 1334  BP: (!) 155/70  Pulse: 71  Temp: (!) 97.5 F (36.4 C)  SpO2: 98%   Filed Weights   09/10/22 1334  Weight: 251 lb (113.9 kg)    Physical Exam Vitals and nursing note reviewed.  HENT:     Head: Normocephalic and atraumatic.     Mouth/Throat:     Pharynx: Oropharynx is clear.  Eyes:     Extraocular Movements: Extraocular movements intact.     Pupils: Pupils are equal, round, and reactive to light.  Cardiovascular:     Rate and Rhythm: Normal rate and regular rhythm.  Pulmonary:     Comments: Decreased breath sounds bilaterally.  Abdominal:     Palpations: Abdomen is soft.  Musculoskeletal:        General: Normal range of motion.     Cervical back: Normal range of motion.  Skin:    General: Skin is warm.  Neurological:     General: No focal deficit present.     Mental Status: He is alert and oriented to person, place, and time.  Psychiatric:        Behavior: Behavior normal.        Judgment: Judgment normal.      LABORATORY DATA:  I have reviewed the data as listed Lab Results  Component Value Date   WBC 7.3 08/25/2022   HGB 14.0 08/25/2022   HCT 41.1 08/25/2022   MCV 92.6 08/25/2022   PLT 141 (L) 08/25/2022   Recent Labs    08/25/22 0947  NA 139  K 4.1  CL 111  CO2 23  GLUCOSE 110*  BUN 19  CREATININE  1.41*  CALCIUM 8.9  GFRNONAA 52*  PROT 7.1  ALBUMIN 4.0  AST 26   ALT 19  ALKPHOS 53  BILITOT 0.9    RADIOGRAPHIC STUDIES: I have personally reviewed the radiological images as listed and agreed with the findings in the report. CT Soft Tissue Neck Wo Contrast  Result Date: 08/25/2022 CLINICAL DATA:  Left neck mass noted over the last several weeks. Left tongue and mouth pain. EXAM: CT NECK WITHOUT CONTRAST TECHNIQUE: Multidetector CT imaging of the neck was performed following the standard protocol without intravenous contrast. RADIATION DOSE REDUCTION: This exam was performed according to the departmental dose-optimization program which includes automated exposure control, adjustment of the mA and/or kV according to patient size and/or use of iterative reconstruction technique. COMPARISON:  None Available. 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 No other mucosal or submucosal lesion. Salivary glands: Parotid glands are normal. Left submandibular gland is slightly displaced but does not appear grossly invaded. No clear tissue plane between the mass in the submandibular gland however. Thyroid: Normal Lymph nodes: Metastatic lymphadenopathy at the level 2 station on the left measuring 2.7 x 2.5 x 2.1 cm. Vascular: Atherosclerotic calcification of the carotid bifurcations. Limited intracranial: Normal Visualized orbits: Normal Mastoids and visualized paranasal sinuses: Mild mucosal thickening of the left maxillary sinus floor. Otherwise negative. Skeleton: Ordinary degenerative spondylosis. Upper chest: Lung apices are clear. Other: None IMPRESSION: 1. 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 2. Metastatic lymphadenopathy at the level 2 station on the left measuring 2.7 x 2.5 x 2.1 cm. 3. Atherosclerotic calcification of the carotid bifurcations. Electronically Signed   By: Paulina Fusi M.D.   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  Staging pending.  Recommended PET scan for further evaluation. T-3-4cm; left neck 2.5cm. [T2N1]  # I have ordered an urgent biopsy of the left neck lymph node;  and PET/CT ASAP.   # I reviewed with the patient the option of definitive chemotherapy and radiation therapy concurrently.  Usually the treatments last 6-8 week [ with weekly chemotherapy and radiation therapy offered Monday through Friday.   #  I discussed that surgery would be usually reserved for residual/recurrent malignancy. Discussed that would recommend reimaging with a PET scan 8 to 12 weeks post chemoradiation.  I discussed that the treatments are fairly intensive; but the goal would be cure. Radiation oncology referral.  Will discussed at tumor conference. Discussed with the patient that we will plan to coordinate his treatment along with start of radiation therapy.  # CAD s/p CABG [2019; Dr.End]- repeat 2 D echo.   # Gait instability- sec to BPPV- walks with cane/ monitor for neuropathy-   # Smoking/ Hx of smoking- quit > 25 years ago.   # CKD ? Stage [name] [July 2024]- 53; await repeat labs  Thank you, Dr.Hoshal for allowing me to participate in the care of your pleasant patient. Please do not hesitate to contact me with questions or concerns in the interim.  # DISPOSITION: # 2 d echo re: CAD ASAP # LN biopsy- re: tongue cancer ASAP # Referral to Dr.Crystal re: head and neck cancer # PET ASAP  # labs today- cbc/cmp/mag/  ordered.  # follow up  3-4 days after the Biopsy is done- MD-  no labs-  Dr.B  All questions were answered. The patient knows to call the clinic with any problems, questions or concerns.    Earna Coder, MD 09/10/2022 3:25 PM

## 2022-09-10 NOTE — Progress Notes (Signed)
C/o vertigo.  C/o swelling in legs and ankles for the past month.

## 2022-09-14 NOTE — Progress Notes (Signed)
Oley Balm, MD sent to [facility] PROCEDURE / BIOPSY REVIEW Date: 09/13/22  Requested Biopsy site: L neck LAN Reason for request: met tongue SCCa Imaging review: Best seen on CT 08/25/22  Decision: Approved Imaging modality to perform: Ultrasound Schedule with: Patient preference (Local vs Mod Sed) Schedule for: Any VIR  Additional comments:   Please contact me with questions, concerns, or if issue pertaining to this request arise.  Dayne Oley Balm, MD [facility]

## 2022-09-15 ENCOUNTER — Telehealth: Payer: Self-pay | Admitting: *Deleted

## 2022-09-15 NOTE — Telephone Encounter (Signed)
Patient scheduled for LN biopsy on July 31 arrival time 12:30 pm. Report to medical mall registration desk. Pt can eat and drink as normal. Pt can also drive himself to procedure as a local anesthetic will be used. Pt aware of above.

## 2022-09-20 ENCOUNTER — Ambulatory Visit: Admission: RE | Admit: 2022-09-20 | Payer: Medicare Other | Source: Ambulatory Visit

## 2022-09-22 ENCOUNTER — Encounter
Admission: RE | Admit: 2022-09-22 | Discharge: 2022-09-22 | Disposition: A | Discharge status: Home / Self Care | Payer: Medicare Other | Source: Ambulatory Visit | Attending: Otolaryngology | Admitting: Otolaryngology

## 2022-09-22 ENCOUNTER — Ambulatory Visit
Admission: RE | Admit: 2022-09-22 | Discharge: 2022-09-22 | Disposition: A | Discharge status: Home / Self Care | Payer: Medicare Other | Source: Ambulatory Visit | Attending: Internal Medicine | Admitting: Internal Medicine

## 2022-09-22 DIAGNOSIS — C01 Malignant neoplasm of base of tongue: Secondary | ICD-10-CM

## 2022-09-22 DIAGNOSIS — C770 Secondary and unspecified malignant neoplasm of lymph nodes of head, face and neck: Secondary | ICD-10-CM | POA: Diagnosis not present

## 2022-09-22 DIAGNOSIS — R221 Localized swelling, mass and lump, neck: Secondary | ICD-10-CM | POA: Diagnosis not present

## 2022-09-22 DIAGNOSIS — C801 Malignant (primary) neoplasm, unspecified: Secondary | ICD-10-CM | POA: Diagnosis not present

## 2022-09-22 DIAGNOSIS — R591 Generalized enlarged lymph nodes: Secondary | ICD-10-CM | POA: Diagnosis not present

## 2022-09-22 DIAGNOSIS — C109 Malignant neoplasm of oropharynx, unspecified: Secondary | ICD-10-CM | POA: Insufficient documentation

## 2022-09-22 DIAGNOSIS — C7989 Secondary malignant neoplasm of other specified sites: Secondary | ICD-10-CM | POA: Diagnosis not present

## 2022-09-22 HISTORY — DX: Malignant neoplasm of base of tongue: C01

## 2022-09-22 LAB — GLUCOSE, CAPILLARY: Glucose-Capillary: 93 mg/dL (ref 70–99)

## 2022-09-22 MED ORDER — FLUDEOXYGLUCOSE F - 18 (FDG) INJECTION
12.0000 | Freq: Once | INTRAVENOUS | Status: AC | PRN
  Administered 2022-09-22: 13.13 via INTRAVENOUS

## 2022-09-22 MED ORDER — LIDOCAINE HCL (PF) 1 % IJ SOLN
10.0000 mL | Freq: Once | INTRAMUSCULAR | Status: AC
  Administered 2022-09-22: 10 mL via INTRADERMAL
  Filled 2022-09-22: qty 10

## 2022-09-22 NOTE — Discharge Instructions (Signed)
Discharge instructions reviewed with patient.

## 2022-09-23 ENCOUNTER — Ambulatory Visit
Admission: RE | Admit: 2022-09-23 | Discharge: 2022-09-23 | Disposition: A | Discharge status: Home / Self Care | Payer: Medicare Other | Source: Ambulatory Visit | Attending: Radiation Oncology | Admitting: Radiation Oncology

## 2022-09-23 ENCOUNTER — Encounter: Payer: Self-pay | Admitting: Radiation Oncology

## 2022-09-23 VITALS — BP 129/64 | HR 71 | Temp 98.2°F | Resp 20 | Wt 246.0 lb

## 2022-09-23 DIAGNOSIS — Z8049 Family history of malignant neoplasm of other genital organs: Secondary | ICD-10-CM | POA: Diagnosis not present

## 2022-09-23 DIAGNOSIS — R252 Cramp and spasm: Secondary | ICD-10-CM | POA: Diagnosis not present

## 2022-09-23 DIAGNOSIS — I6523 Occlusion and stenosis of bilateral carotid arteries: Secondary | ICD-10-CM | POA: Diagnosis not present

## 2022-09-23 DIAGNOSIS — M542 Cervicalgia: Secondary | ICD-10-CM | POA: Diagnosis not present

## 2022-09-23 DIAGNOSIS — Z87891 Personal history of nicotine dependence: Secondary | ICD-10-CM | POA: Diagnosis not present

## 2022-09-23 DIAGNOSIS — I252 Old myocardial infarction: Secondary | ICD-10-CM | POA: Diagnosis not present

## 2022-09-23 DIAGNOSIS — Z79899 Other long term (current) drug therapy: Secondary | ICD-10-CM | POA: Diagnosis not present

## 2022-09-23 DIAGNOSIS — I700 Atherosclerosis of aorta: Secondary | ICD-10-CM | POA: Diagnosis not present

## 2022-09-23 DIAGNOSIS — N200 Calculus of kidney: Secondary | ICD-10-CM | POA: Diagnosis not present

## 2022-09-23 DIAGNOSIS — C01 Malignant neoplasm of base of tongue: Secondary | ICD-10-CM | POA: Insufficient documentation

## 2022-09-23 DIAGNOSIS — Z5111 Encounter for antineoplastic chemotherapy: Secondary | ICD-10-CM | POA: Diagnosis not present

## 2022-09-23 DIAGNOSIS — Z510 Encounter for antineoplastic radiation therapy: Secondary | ICD-10-CM | POA: Insufficient documentation

## 2022-09-23 DIAGNOSIS — I2510 Atherosclerotic heart disease of native coronary artery without angina pectoris: Secondary | ICD-10-CM | POA: Diagnosis not present

## 2022-09-23 DIAGNOSIS — N400 Enlarged prostate without lower urinary tract symptoms: Secondary | ICD-10-CM | POA: Diagnosis not present

## 2022-09-23 DIAGNOSIS — R079 Chest pain, unspecified: Secondary | ICD-10-CM | POA: Diagnosis not present

## 2022-09-23 DIAGNOSIS — K8020 Calculus of gallbladder without cholecystitis without obstruction: Secondary | ICD-10-CM | POA: Diagnosis not present

## 2022-09-23 DIAGNOSIS — C770 Secondary and unspecified malignant neoplasm of lymph nodes of head, face and neck: Secondary | ICD-10-CM | POA: Diagnosis not present

## 2022-09-23 DIAGNOSIS — I083 Combined rheumatic disorders of mitral, aortic and tricuspid valves: Secondary | ICD-10-CM | POA: Diagnosis not present

## 2022-09-23 DIAGNOSIS — Z8249 Family history of ischemic heart disease and other diseases of the circulatory system: Secondary | ICD-10-CM | POA: Diagnosis not present

## 2022-09-23 DIAGNOSIS — M2550 Pain in unspecified joint: Secondary | ICD-10-CM | POA: Diagnosis not present

## 2022-09-23 DIAGNOSIS — H8110 Benign paroxysmal vertigo, unspecified ear: Secondary | ICD-10-CM | POA: Diagnosis not present

## 2022-09-23 DIAGNOSIS — K4090 Unilateral inguinal hernia, without obstruction or gangrene, not specified as recurrent: Secondary | ICD-10-CM | POA: Diagnosis not present

## 2022-09-23 DIAGNOSIS — Z823 Family history of stroke: Secondary | ICD-10-CM | POA: Diagnosis not present

## 2022-09-23 DIAGNOSIS — M549 Dorsalgia, unspecified: Secondary | ICD-10-CM | POA: Diagnosis not present

## 2022-09-23 NOTE — Consult Note (Signed)
NEW PATIENT EVALUATION  Name: Cory Hess  MRN: [id number]  Date:   09/23/2022     DOB: June 13, 1945   This 77 y.o. 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  REFERRING PHYSICIAN: Earna Coder, *  CHIEF COMPLAINT:  Chief Complaint  Patient presents with   Cancer base of tongue    DIAGNOSIS: The encounter diagnosis was Cancer of base of tongue (HCC).   PREVIOUS INVESTIGATIONS:  CT scan PET CT scans reviewed Clinical notes reviewed Pathology reports reviewed  HPI: Patient is a 77 year old male who had a several month history of increasing left neck adenopathy.  He was having some dysphagia and head and neck pain.  Initial CT scan in July showed a 3.5 x 4 cm mass in the left posterior tongue base consistent with malignancy.  He also metastatic lymphadenopathy level 2 station the left neck measuring 2.7 cm in greatest dimension.  PET CT scan was performed on although has not been officially read shows hypermetabolic activity in both lesions consistent with primary head and neck cancer.  Biopsy was performed showing squamous cell carcinoma.  He is seen today for evaluation of concurrent chemoradiation.  PLANNED TREATMENT REGIMEN: Concurrent chemoradiation  PAST MEDICAL HISTORY:  has a past medical history of Alternating RBBB & LBBB, Arthritis, Bladder tumor, CAD (coronary artery disease), Cancer (HCC), Essential hypertension, HFrEF (heart failure with reduced ejection fraction) (HCC), Hyperlipidemia LDL goal <70, Ischemic cardiomyopathy, Myocardial infarction (HCC) (08/27/2017), Shingles, and Vertigo.    PAST SURGICAL HISTORY:  Past Surgical History:  Procedure Laterality Date   CARDIAC CATHETERIZATION     CATARACT EXTRACTION W/PHACO Left 07/18/2018   Procedure: CATARACT EXTRACTION PHACO AND INTRAOCULAR LENS PLACEMENT (IOC)  LEFT;  Surgeon: Nevada Crane, MD;  Location: [facility];   Service: Ophthalmology;  Laterality: Left;   CATARACT EXTRACTION W/PHACO Right 08/14/2018   Procedure: CATARACT EXTRACTION PHACO AND INTRAOCULAR LENS PLACEMENT (IOC)  RIGHT;  Surgeon: Nevada Crane, MD;  Location: [facility];  Service: Ophthalmology;  Laterality: Right;   CORONARY ARTERY BYPASS GRAFT N/A 08/27/2017   Procedure: CORONARY ARTERY BYPASS GRAFTING (CABG) ON PUMP USING LEFT INTERNAL MAMMARY ARTERY AND LEFT GREATER SAPHENOUS VEIN VIA ENDOVEIN HARVEST;  Surgeon: Alleen Borne, MD;  Location: [facility] OR;  Service: Open Heart Surgery;  Laterality: N/A;   CORONARY/GRAFT ACUTE MI REVASCULARIZATION N/A 08/27/2017   Procedure: Coronary/Graft Acute MI Revascularization;  Surgeon: Iran Ouch, MD;  Location: [facility] INV[facility] LAB;  Service: Cardiovascular;  Laterality: N/A;   CYSTOSCOPY W/ RETROGRADES Bilateral 12/25/2018   Procedure: CYSTOSCOPY WITH RETROGRADE PYELOGRAM;  Surgeon: Vanna Scotland, MD;  Location: [facility] ORS;  Service: Urology;  Laterality: Bilateral;   FRACTURE SURGERY Right 1958   arm and left wrist compound fracture, no metal   HERNIA REPAIR Right    inguinial   LEFT HEART CATH AND CORONARY ANGIOGRAPHY N/A 08/27/2017   Procedure: LEFT HEART CATH AND CORONARY ANGIOGRAPHY;  Surgeon: Iran Ouch, MD;  Location: [facility];  Service: Cardiovascular;  Laterality: N/A;   TRANSURETHRAL RESECTION OF BLADDER TUMOR WITH MITOMYCIN-C N/A 12/25/2018   Procedure: TRANSURETHRAL RESECTION OF BLADDER TUMOR WITH Gemcitabine;  Surgeon: Vanna Scotland, MD;  Location: [facility] ORS;  Service: Urology;  Laterality: N/A;    FAMILY HISTORY: family history includes Cervical cancer in his maternal grandmother; Heart failure in his mother; Lung disease in his father; Stroke in his father.  SOCIAL HISTORY:  reports that he quit smoking about 24 years ago. His smoking use included cigarettes. His smokeless tobacco use includes snuff. He reports that he does not currently use alcohol. He  reports that he does not currently use drugs.  ALLERGIES: Patient has no known allergies.  MEDICATIONS:  Current Outpatient Medications  Medication Sig Dispense Refill   acetaminophen (TYLENOL) 650 MG CR tablet Take 650 mg by mouth every 8 (eight) hours as needed for pain.     aspirin EC 81 MG EC tablet Take 1 tablet (81 mg total) by mouth daily.     atorvastatin (LIPITOR) 80 MG tablet Take 1 tablet by mouth once daily 90 tablet 0   carvedilol (COREG) 12.5 MG tablet Take 1 tablet (12.5 mg total) by mouth 2 (two) times daily with a meal. PLEASE CALL OFFICE TO SCHEDULE APPOINTMENT PRIOR TO NEXT REFILL 180 tablet 0   losartan (COZAAR) 100 MG tablet Take 1 tablet (100 mg total) by mouth daily. PLEASE CALL OFFICE TO SCHEDULE APPOINTMENT PRIOR TO NEXT REFILL 90 tablet 0   Multiple Vitamin (MULTIVITAMIN WITH MINERALS) TABS tablet Take 1 tablet by mouth daily. One a Day over 50/ lunch     nitroGLYCERIN (NITROSTAT) 0.4 MG SL tablet DISSOLVE ONE TABLET UNDER THE TONGUE EVERY 5 MINUTES AS NEEDED FOR CHEST PAIN.  DO NOT EXCEED A TOTAL OF 3 DOSES IN 15 MINUTES 25 tablet 0   No current facility-administered medications for this encounter.    ECOG PERFORMANCE STATUS:  1 - Symptomatic but completely ambulatory  REVIEW OF SYSTEMS: Patient denies any weight loss, fatigue, weakness, fever, chills or night sweats. Patient denies any loss of vision, blurred vision. Patient denies any ringing  of the ears or hearing loss. No irregular heartbeat. Patient denies heart murmur or history of fainting. Patient denies any chest pain or pain radiating to her upper extremities. Patient denies any shortness of breath, difficulty breathing at night, cough or hemoptysis. Patient denies any swelling in the lower legs. Patient denies any nausea vomiting, vomiting of blood, or coffee ground material in the vomitus. Patient denies any stomach pain. Patient states has had normal bowel movements no significant constipation or  diarrhea. Patient denies any dysuria, hematuria or significant nocturia. Patient denies any problems walking, swelling in the joints or loss of balance. Patient denies any skin changes, loss of hair or loss of weight. Patient denies any excessive worrying or anxiety or significant depression. Patient denies any problems with insomnia. Patient denies excessive thirst, polyuria, polydipsia. Patient denies any swollen glands, patient denies easy bruising or easy bleeding. Patient denies any recent infections, allergies or URI. Patient "s visual fields have not changed significantly in recent time.   PHYSICAL EXAM: BP 129/64   Pulse 71   Temp 98.2 F (36.8 C) (Tympanic)   Resp 20   Wt 246 lb (111.6 kg)   BMI 33.36 kg/m  Patient is a fixed large mass in the left epigastric region.  Recently been biopsied.  Slightly tender.  Oral cavity is clear with teeth in a good state of repair.  Well-developed well-nourished patient in NAD. HEENT reveals PERLA, EOMI, discs not visualized.  Oral cavity is clear. No oral mucosal lesions are identified. Neck is clear without evidence of cervical or supraclavicular adenopathy. Lungs are clear to A&P. Cardiac examination is essentially unremarkable with regular rate and rhythm without murmur rub or thrill. Abdomen is benign with no organomegaly or masses noted. Motor sensory and DTR levels are equal and  symmetric in the upper and lower extremities. Cranial nerves II through XII are grossly intact. Proprioception is intact. No peripheral adenopathy or edema is identified. No motor or sensory levels are noted. Crude visual fields are within normal range.  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  Will plan on delivering  70 Gray in 35 fractions.  Would use IMRT treatment planning and delivery to spare critical structures such as a salivary gland oral cavity spinal cord.  Will treat his hypermetabolic areas of involvement to 70 Gray remaining neck nodes to 54 Gray.  I will treat both bilateral necks since his lesion does encroach and past the midline slightly.  Risks and benefits of treatment: Loss of taste oral mucositis skin reaction fatigue dysphagia all were reviewed in detail with the patient.  I personally set up and ordered CT simulation.  Will coordinate his chemotherapy with medical oncology.  There will be extra effort by both professional staff as well as technical staff to coordinate and manage concurrent chemoradiation and ensuing side effects during his treatments. Patient comprehends my recommendations well.  I would like to take this opportunity to thank you for allowing me to participate in the care of your patient.Carmina Miller, MD

## 2022-09-27 ENCOUNTER — Ambulatory Visit
Admission: RE | Admit: 2022-09-27 | Discharge: 2022-09-27 | Disposition: A | Discharge status: Home / Self Care | Payer: Medicare Other | Source: Ambulatory Visit | Attending: Internal Medicine | Admitting: Internal Medicine

## 2022-09-27 DIAGNOSIS — Z951 Presence of aortocoronary bypass graft: Secondary | ICD-10-CM | POA: Diagnosis not present

## 2022-09-27 DIAGNOSIS — I25810 Atherosclerosis of coronary artery bypass graft(s) without angina pectoris: Secondary | ICD-10-CM

## 2022-09-27 DIAGNOSIS — I371 Nonrheumatic pulmonary valve insufficiency: Secondary | ICD-10-CM | POA: Insufficient documentation

## 2022-09-27 DIAGNOSIS — I340 Nonrheumatic mitral (valve) insufficiency: Secondary | ICD-10-CM | POA: Insufficient documentation

## 2022-09-27 DIAGNOSIS — Q2112 Patent foramen ovale: Secondary | ICD-10-CM | POA: Diagnosis not present

## 2022-09-27 DIAGNOSIS — I252 Old myocardial infarction: Secondary | ICD-10-CM | POA: Diagnosis not present

## 2022-09-27 LAB — ECHOCARDIOGRAM COMPLETE
AR max vel: 2.65 cm2
AV Area VTI: 2.78 cm2
AV Area mean vel: 2.78 cm2
AV Mean grad: 4 mmHg
AV Peak grad: 8.1 mmHg
Ao pk vel: 1.42 m/s
Area-P 1/2: 3.97 cm2
Calc EF: 56.7 %
MV VTI: 2.5 cm2
S' Lateral: 3.4 cm
Single Plane A2C EF: 57.7 %
Single Plane A4C EF: 50.5 %

## 2022-09-27 NOTE — Progress Notes (Signed)
*  PRELIMINARY RESULTS* Echocardiogram 2D Echocardiogram has been performed.  Cory Hess 09/27/2022, 10:46 AM

## 2022-09-28 ENCOUNTER — Telehealth: Payer: Self-pay | Admitting: Nurse Practitioner

## 2022-09-28 NOTE — Telephone Encounter (Signed)
Pt c/o swelling/edema: STAT if pt has developed SOB within 24 hours  If swelling, where is the swelling located? Both leg swelling  How much weight have you gained and in what time span? no  Have you gained 2 pounds in a day or 5 pounds in a week? Don't know  Do you have a log of your daily weights (if so, list)? no  Are you currently taking a fluid pill? Did in the past but not right know  Are you currently SOB? At times with weather  Have you traveled recently in a car or plane for an extended period of time? no

## 2022-09-28 NOTE — Telephone Encounter (Signed)
Contacted patient, he is able to come next Tuesday for DOD Dr.Gollan. patient advised to keep a weight log, and will call if anything worsens. Patient aware and verbalized understanding of upcoming appointment.   Will route to DOD/RN to make aware for information.  Thanks!

## 2022-09-28 NOTE — Telephone Encounter (Signed)
I recommend office visit to evaluate.

## 2022-09-28 NOTE — Telephone Encounter (Signed)
Returned call to pt.  Pt reported he's been experiencing bilateral leg swelling since last office visit. He stated 2 weeks ago his legs were very tight and skin felt stretched. He reported swelling is somewhat better but noted left is worse than right. Pt reported SOB only when in hot weather, but stated on a normal day he is able to walk long distances with no issues.  No weights to report as pt stated he does not weigh himself daily.  Will forward to NP for recommendations.

## 2022-09-29 ENCOUNTER — Inpatient Hospital Stay: Payer: Medicare Other | Attending: Internal Medicine | Admitting: Hospice and Palliative Medicine

## 2022-09-29 ENCOUNTER — Telehealth: Payer: Self-pay | Admitting: Cardiovascular Disease

## 2022-09-29 DIAGNOSIS — Z823 Family history of stroke: Secondary | ICD-10-CM | POA: Insufficient documentation

## 2022-09-29 DIAGNOSIS — R079 Chest pain, unspecified: Secondary | ICD-10-CM | POA: Insufficient documentation

## 2022-09-29 DIAGNOSIS — R252 Cramp and spasm: Secondary | ICD-10-CM | POA: Insufficient documentation

## 2022-09-29 DIAGNOSIS — N400 Enlarged prostate without lower urinary tract symptoms: Secondary | ICD-10-CM | POA: Insufficient documentation

## 2022-09-29 DIAGNOSIS — I2510 Atherosclerotic heart disease of native coronary artery without angina pectoris: Secondary | ICD-10-CM | POA: Insufficient documentation

## 2022-09-29 DIAGNOSIS — I700 Atherosclerosis of aorta: Secondary | ICD-10-CM | POA: Insufficient documentation

## 2022-09-29 DIAGNOSIS — H8110 Benign paroxysmal vertigo, unspecified ear: Secondary | ICD-10-CM | POA: Insufficient documentation

## 2022-09-29 DIAGNOSIS — C01 Malignant neoplasm of base of tongue: Secondary | ICD-10-CM | POA: Insufficient documentation

## 2022-09-29 DIAGNOSIS — Z5111 Encounter for antineoplastic chemotherapy: Secondary | ICD-10-CM | POA: Insufficient documentation

## 2022-09-29 DIAGNOSIS — K4090 Unilateral inguinal hernia, without obstruction or gangrene, not specified as recurrent: Secondary | ICD-10-CM | POA: Insufficient documentation

## 2022-09-29 DIAGNOSIS — M542 Cervicalgia: Secondary | ICD-10-CM | POA: Insufficient documentation

## 2022-09-29 DIAGNOSIS — Z87891 Personal history of nicotine dependence: Secondary | ICD-10-CM | POA: Insufficient documentation

## 2022-09-29 DIAGNOSIS — I6523 Occlusion and stenosis of bilateral carotid arteries: Secondary | ICD-10-CM | POA: Insufficient documentation

## 2022-09-29 DIAGNOSIS — N200 Calculus of kidney: Secondary | ICD-10-CM | POA: Insufficient documentation

## 2022-09-29 DIAGNOSIS — M549 Dorsalgia, unspecified: Secondary | ICD-10-CM | POA: Insufficient documentation

## 2022-09-29 DIAGNOSIS — Z8049 Family history of malignant neoplasm of other genital organs: Secondary | ICD-10-CM | POA: Insufficient documentation

## 2022-09-29 DIAGNOSIS — M2550 Pain in unspecified joint: Secondary | ICD-10-CM | POA: Insufficient documentation

## 2022-09-29 DIAGNOSIS — Z510 Encounter for antineoplastic radiation therapy: Secondary | ICD-10-CM | POA: Insufficient documentation

## 2022-09-29 DIAGNOSIS — C770 Secondary and unspecified malignant neoplasm of lymph nodes of head, face and neck: Secondary | ICD-10-CM | POA: Insufficient documentation

## 2022-09-29 DIAGNOSIS — Z8249 Family history of ischemic heart disease and other diseases of the circulatory system: Secondary | ICD-10-CM | POA: Insufficient documentation

## 2022-09-29 DIAGNOSIS — K8020 Calculus of gallbladder without cholecystitis without obstruction: Secondary | ICD-10-CM | POA: Insufficient documentation

## 2022-09-29 DIAGNOSIS — I083 Combined rheumatic disorders of mitral, aortic and tricuspid valves: Secondary | ICD-10-CM | POA: Insufficient documentation

## 2022-09-29 DIAGNOSIS — I252 Old myocardial infarction: Secondary | ICD-10-CM | POA: Insufficient documentation

## 2022-09-29 DIAGNOSIS — Z79899 Other long term (current) drug therapy: Secondary | ICD-10-CM | POA: Insufficient documentation

## 2022-09-29 NOTE — Progress Notes (Signed)
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 The MOC considered available treatment options and made the following recommendations/referrals:  Nutrition, SLP, SW  The MOC is a meeting of clinicians from various specialty areas who evaluate and discuss patients for whom a multidisciplinary approach is being considered. Final determinations in the plan of care are those of the provider(s).   Today's extended care, comprehensive team conference, Cory Hess was not present for the discussion and was not examined.

## 2022-09-29 NOTE — Telephone Encounter (Signed)
Patient called because he was scheduled for 08/13 DOD yesterday, but he has radiation on 08/12. Patient states the radiation will happen 5 days a week for 7 weeks and would need to r/s this appt. Please advise.

## 2022-09-29 NOTE — Telephone Encounter (Signed)
Left a message for pt to talk back, that we might have an opening with C Furth on fri 10/01/22. If he would like .

## 2022-09-30 DIAGNOSIS — C01 Malignant neoplasm of base of tongue: Secondary | ICD-10-CM | POA: Diagnosis not present

## 2022-10-04 ENCOUNTER — Ambulatory Visit
Admission: RE | Admit: 2022-10-04 | Discharge: 2022-10-04 | Disposition: A | Discharge status: Home / Self Care | Payer: Medicare Other | Source: Ambulatory Visit | Attending: Radiation Oncology | Admitting: Radiation Oncology

## 2022-10-04 DIAGNOSIS — C01 Malignant neoplasm of base of tongue: Secondary | ICD-10-CM | POA: Diagnosis not present

## 2022-10-04 DIAGNOSIS — M542 Cervicalgia: Secondary | ICD-10-CM | POA: Diagnosis not present

## 2022-10-04 DIAGNOSIS — Z510 Encounter for antineoplastic radiation therapy: Secondary | ICD-10-CM | POA: Diagnosis not present

## 2022-10-04 DIAGNOSIS — C770 Secondary and unspecified malignant neoplasm of lymph nodes of head, face and neck: Secondary | ICD-10-CM | POA: Diagnosis not present

## 2022-10-04 DIAGNOSIS — Z5111 Encounter for antineoplastic chemotherapy: Secondary | ICD-10-CM | POA: Diagnosis not present

## 2022-10-04 DIAGNOSIS — I6523 Occlusion and stenosis of bilateral carotid arteries: Secondary | ICD-10-CM | POA: Diagnosis not present

## 2022-10-04 NOTE — Progress Notes (Unsigned)
Cardiology Office Note  Date:  10/05/2022   ID:  Cory Hess, DOB 1945/10/28, MRN [id number]  PCP:  Patient, No Pcp Per   Chief Complaint  Patient presents with   Acute Visit    Pt c/o bilateral leg swelling and SOB on exertion. Meds reviewed.      HPI:  [name] is a 77 year old male with past medical history of  hypertension,  hyperlipidemia,  CAD, alternating bundle branch block,  ischemic cardiomyopathy/HFrEF, EF 55 to 60% Patient of Dr. Kirke Corin Who presents for coronary disease, cardiomyopathy, worsening leg swelling over the past 2 weeks  experiencing bilateral leg swelling since last office visit.03/2022 He stated 3 weeks ago his legs were very tight and skin felt stretched. He reported swelling is somewhat better but noted left is worse than right. Pt reported SOB only when in hot weather, but stated on a normal day he is able to walk long distances with no issues.  Echocardiogram September 27, 2022 EF 55 to 60%, moderate LVH, grade 1 diastolic dysfunction No significant valvular heart disease  Started radiation on neck for left tongue base mass Will need chemo  EKG personally reviewed by myself on todays visit EKG Interpretation Date/Time:  Tuesday October 05 2022 15:38:56 EDT Ventricular Rate:  69 PR Interval:  222 QRS Duration:  162 QT Interval:  452 QTC Calculation: 484 R Axis:   -82  Text Interpretation: Sinus rhythm with 1st degree A-V block Left axis deviation Right bundle branch block Septal infarct , age undetermined When compared with ECG of 06-Dec-2021 15:53, Premature ventricular complexes are no longer Present Confirmed by Julien Nordmann [phone]) on 10/05/2022 4:02:28 PM   Of the past medical history reviewed  In July 2019, admitted with chest pain  Catheterization showed multivessel CAD and following intra-aortic balloon pump placement, he was transferred to [facility] underwent CABG x 3.  Intraoperative TEE showed an EF of 30 to 35%.  Follow-up  echo November 2019, showed improvement in LV function to 45-50%.  He was maintained on Plavix following his CABG however, this was discontinued in September 2020 in the setting of hematuria and finding of bladder tumor.  He subsequently underwent TURBT in November 2020.   [name] was last seen in cardiology clinic in August 2023, at which time he was doing well.  In October 2023, seen in the emergency department dizziness which was felt to represent vertigo.  He was treated with meclizine and Zofran.  Since then, [name] has done well.  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  He does not experience chest pain and denies dyspnea, palpitations, PND, orthopnea, dizziness, syncope, or early satiety.  Sometimes, at the end of the day, he might note mild swelling above his socks but this is not typically bothersome.   PMH:   has a past medical history of Alternating RBBB & LBBB, Arthritis, Bladder tumor, CAD (coronary artery disease), Cancer (HCC), Cancer of base of tongue (HCC), Essential hypertension, HFrEF (heart failure with reduced ejection fraction) (HCC), Hyperlipidemia LDL goal <70, Ischemic cardiomyopathy, Myocardial infarction (HCC) (08/27/2017), Shingles, and Vertigo.  PSH:    Past Surgical History:  Procedure Laterality Date   CARDIAC CATHETERIZATION     CATARACT EXTRACTION W/PHACO Left 07/18/2018   Procedure: CATARACT EXTRACTION PHACO AND INTRAOCULAR LENS PLACEMENT (IOC)  LEFT;  Surgeon: Nevada Crane, MD;  Location: [facility];  Service: Ophthalmology;  Laterality: Left;   CATARACT EXTRACTION W/PHACO Right 08/14/2018   Procedure: CATARACT EXTRACTION PHACO AND INTRAOCULAR LENS  PLACEMENT (IOC)  RIGHT;  Surgeon: Nevada Crane, MD;  Location: [facility];  Service: Ophthalmology;  Laterality: Right;   CORONARY ARTERY BYPASS GRAFT N/A 08/27/2017   Procedure: CORONARY ARTERY BYPASS GRAFTING (CABG) ON PUMP USING LEFT INTERNAL MAMMARY ARTERY AND LEFT GREATER SAPHENOUS VEIN VIA ENDOVEIN  HARVEST;  Surgeon: Alleen Borne, MD;  Location: [facility] OR;  Service: Open Heart Surgery;  Laterality: N/A;   CORONARY/GRAFT ACUTE MI REVASCULARIZATION N/A 08/27/2017   Procedure: Coronary/Graft Acute MI Revascularization;  Surgeon: Iran Ouch, MD;  Location: [facility];  Service: Cardiovascular;  Laterality: N/A;   CYSTOSCOPY W/ RETROGRADES Bilateral 12/25/2018   Procedure: CYSTOSCOPY WITH RETROGRADE PYELOGRAM;  Surgeon: Vanna Scotland, MD;  Location: [facility];  Service: Urology;  Laterality: Bilateral;   FRACTURE SURGERY Right 1958   arm and left wrist compound fracture, no metal   HERNIA REPAIR Right    inguinial   LEFT HEART CATH AND CORONARY ANGIOGRAPHY N/A 08/27/2017   Procedure: LEFT HEART CATH AND CORONARY ANGIOGRAPHY;  Surgeon: Iran Ouch, MD;  Location: [facility];  Service: Cardiovascular;  Laterality: N/A;   TRANSURETHRAL RESECTION OF BLADDER TUMOR WITH MITOMYCIN-C N/A 12/25/2018   Procedure: TRANSURETHRAL RESECTION OF BLADDER TUMOR WITH Gemcitabine;  Surgeon: Vanna Scotland, MD;  Location: [facility];  Service: Urology;  Laterality: N/A;    Current Outpatient Medications  Medication Sig Dispense Refill   acetaminophen (TYLENOL) 650 MG CR tablet Take 650 mg by mouth every 8 (eight) hours as needed for pain.     aspirin EC 81 MG EC tablet Take 1 tablet (81 mg total) by mouth daily.     atorvastatin (LIPITOR) 80 MG tablet Take 1 tablet by mouth once daily 90 tablet 0   carvedilol (COREG) 12.5 MG tablet Take 1 tablet (12.5 mg total) by mouth 2 (two) times daily with a meal. PLEASE CALL OFFICE TO SCHEDULE APPOINTMENT PRIOR TO NEXT REFILL 180 tablet 0   losartan (COZAAR) 100 MG tablet Take 1 tablet (100 mg total) by mouth daily. PLEASE CALL OFFICE TO SCHEDULE APPOINTMENT PRIOR TO NEXT REFILL 90 tablet 0   Multiple Vitamin (MULTIVITAMIN WITH MINERALS) TABS tablet Take 1 tablet by mouth daily. One a Day over 50/ lunch     nitroGLYCERIN (NITROSTAT) 0.4 MG SL  tablet DISSOLVE ONE TABLET UNDER THE TONGUE EVERY 5 MINUTES AS NEEDED FOR CHEST PAIN.  DO NOT EXCEED A TOTAL OF 3 DOSES IN 15 MINUTES 25 tablet 0   No current facility-administered medications for this visit.     Allergies:   Patient has no known allergies.   Social History:  The patient  reports that he quit smoking about 24 years ago. His smoking use included cigarettes. His smokeless tobacco use includes snuff. He reports that he does not currently use alcohol. He reports that he does not currently use drugs.   Family History:   family history includes Cervical cancer in his maternal grandmother; Heart failure in his mother; Lung disease in his father; Stroke in his father.    Review of Systems: Review of Systems  Constitutional: Negative.   HENT: Negative.    Respiratory: Negative.    Cardiovascular: Negative.   Gastrointestinal: Negative.   Musculoskeletal: Negative.   Neurological: Negative.   Psychiatric/Behavioral: Negative.    All other systems reviewed and are negative.   PHYSICAL EXAM: VS:  BP (!) 140/72 (BP Location: Left Arm, Patient Position: Sitting, Cuff Size: Large)   Pulse 69   Ht  6' (1.829 m)   Wt 247 lb 12.8 oz (112.4 kg)   SpO2 96%   BMI 33.61 kg/m  , BMI Body mass index is 33.61 kg/m. GEN: Well nourished, well developed, in no acute distress HEENT: normal Neck: no JVD, carotid bruits, or masses Cardiac: RRR; no murmurs, rubs, or gallops,1+ LE edema bilateral Respiratory:  clear to auscultation bilaterally, normal work of breathing GI: soft, nontender, nondistended, + BS MS: no deformity or atrophy Skin: warm and dry, no rash Neuro:  Strength and sensation are intact Psych: euthymic mood, full affect  Recent Labs: 08/25/2022: ALT 19; BUN 19; Creatinine, Ser 1.41; Hemoglobin 14.0; Platelets 141; Potassium 4.1; Sodium 139    Lipid Panel Lab Results  Component Value Date   CHOL 93 (L) 04/01/2021   HDL 30 (L) 04/01/2021   LDLCALC 45 04/01/2021    TRIG 94 04/01/2021      Wt Readings from Last 3 Encounters:  10/05/22 247 lb 12.8 oz (112.4 kg)  09/23/22 246 lb (111.6 kg)  09/10/22 251 lb (113.9 kg)     ASSESSMENT AND PLAN:  Problem List Items Addressed This Visit       Cardiology Problems   Coronary artery disease - Primary   Relevant Orders   EKG 12-Lead (Completed)   Other Visit Diagnoses     HFrEF (heart failure with reduced ejection fraction) (HCC)       Hyperlipidemia LDL goal <70       Ischemic cardiomyopathy       Essential hypertension       S/P CABG (coronary artery bypass graft)          Acute on chronic diastolic CHF Reports chronic lower extremity edema since last clinic visit February 2024 Denies significant shortness of breath Weight stable if not down several pounds since February 2024 Moderate LVH which could contribute to diastolic CHF symptoms Denies excessive fluid intake With that said 1+ pitting lower extremity edema noted bilaterally Recommended he start torsemide 20 mg daily with potassium 10 mill equivalents daily Reevaluate in 1 month time Recommend he call us if lower extremity swelling resolves back to baseline status at which time we could potentially decrease torsemide down to every other day  Coronary artery disease with stable angina Currently with no symptoms of angina. No further workup at this time. Continue current medication regimen. Recent echocardiogram with normal ejection fraction Cholesterol at goal   Total encounter time more than 40 minutes  Greater than 50% was spent in counseling and coordination of care with the patient    Signed, Dossie Arbour, M.D., Ph.D. [facility] [place], [place] [phone]

## 2022-10-05 ENCOUNTER — Ambulatory Visit: Payer: Medicare Other | Attending: Cardiovascular Disease | Admitting: Cardiovascular Disease

## 2022-10-05 ENCOUNTER — Encounter: Payer: Self-pay | Admitting: Cardiovascular Disease

## 2022-10-05 VITALS — BP 140/72 | HR 69 | Ht 72.0 in | Wt 247.8 lb

## 2022-10-05 DIAGNOSIS — I5020 Unspecified systolic (congestive) heart failure: Secondary | ICD-10-CM | POA: Insufficient documentation

## 2022-10-05 DIAGNOSIS — Z951 Presence of aortocoronary bypass graft: Secondary | ICD-10-CM | POA: Insufficient documentation

## 2022-10-05 DIAGNOSIS — Z79899 Other long term (current) drug therapy: Secondary | ICD-10-CM | POA: Diagnosis not present

## 2022-10-05 DIAGNOSIS — E785 Hyperlipidemia, unspecified: Secondary | ICD-10-CM | POA: Diagnosis not present

## 2022-10-05 DIAGNOSIS — I25810 Atherosclerosis of coronary artery bypass graft(s) without angina pectoris: Secondary | ICD-10-CM | POA: Diagnosis not present

## 2022-10-05 DIAGNOSIS — I10 Essential (primary) hypertension: Secondary | ICD-10-CM | POA: Insufficient documentation

## 2022-10-05 DIAGNOSIS — I255 Ischemic cardiomyopathy: Secondary | ICD-10-CM | POA: Diagnosis not present

## 2022-10-05 MED ORDER — TORSEMIDE 20 MG PO TABS: 20.0000 mg | ORAL_TABLET | Freq: Two times a day (BID) | ORAL | 3 refills | Status: DC

## 2022-10-05 MED ORDER — POTASSIUM CHLORIDE ER 10 MEQ PO TBCR: 10.0000 meq | EXTENDED_RELEASE_TABLET | Freq: Every day | ORAL | 3 refills | Status: DC

## 2022-10-05 NOTE — Patient Instructions (Addendum)
Medication Instructions:  Please start torsemide 20 mg daily with potassium 10 mg daily   If you need a refill on your cardiac medications before your next appointment, please call your pharmacy.   Lab work: [employer] today  Testing/Procedures: No new testing needed  Follow-Up: At [facility], you and your health needs are our priority.  As part of our continuing mission to provide you with exceptional heart care, we have created designated Provider Care Teams.  These Care Teams include your primary Cardiologist (physician) and Advanced Practice Providers (APPs -  Physician Assistants and Nurse Practitioners) who all work together to provide you with the care you need, when you need it.  You will need a follow up appointment in 1 months with Ward Givens  Providers on your designated Care Team:   Nicolasa Ducking, NP Eula Listen, PA-C Cadence Fransico [place], [place]  COVID-19 Vaccine Information can be found at: [email or web address] For questions related to vaccine distribution or appointments, please email [email or web address] or call [phone].

## 2022-10-06 NOTE — Assessment & Plan Note (Addendum)
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 JULY 31st, 2024-  Left tongue base primary with ipsilateral level 2 nodal metastasis. A right-sided diminutive level 2 node is mildly hypermetabolic and suspicious for contralateral nodal metastasis.   No extracervical metastatic disease identified.  # Discussed at tumor conference. Discussed with the patient that we will plan to coordinate his treatment along with start of radiation therapy- plan to start on 8/21.   # I reviewed with the patient the option of definitive chemotherapy and radiation therapy concurrently.  Usually the treatments last 6-8 week [ with weekly chemotherapy and radiation therapy offered Monday through Friday.   #  I reviewed at length the individual components with chemotherapy; and the schedule in detail.  I also discussed the potential side effects including but not limited to-increasing fatigue, nausea vomiting, diarrhea, hair loss, sores in the mouth, increase risk of infection and also neuropathy.  Also reviewed the multiple strategies to avoid/mitigate similar side effects including preemptive medications-for nausea vomiting.  Also discussed at length regarding good oral hygiene/hydration and in general precautions regarding duration of infections.   # I discussed that surgery would be usually reserved for residual/recurrent malignancy. Discussed that would recommend reimaging with a PET scan 8 to 12 weeks post chemoradiation.  I discussed that the treatments are fairly intensive; but the goal would be cure.   # CAD s/p CABG [2019; Dr.End]- repeat 2 D echo.   # Gait instability- sec to BPPV- walks with cane/ monitor for neuropathy-   # Smoking/ Hx of smoking- quit > 25 years ago.   # CKD ? Stage [name] [July 2024]- 53;  await repeat labs  # #Baseline weight:[240-250- July-AUG, 2024] I discussed at length the risk of weight loss / difficulty swallowing-counseled the patient regarding potential weight loss; moderate to severe mucositis from chemoradiation.  Discussed that if significant weight loss noted recommend PEG tube placement.  We will make a referral to [facility] nutrition.  # Hypermetabolic focus within the prostate in the setting of prostatomegaly. Correlation with PSA level.  # IV access: Recommend port placement-given the frequency of the infusions.  Will refer to IR.  Patient agrees/declined.  # Chemotherapy education/nutrition evaluation: port placement. Plan start chemotherapy week of 26th- Sent the scripts for antiemetics-Zofran and Compazine; EMLA cream sent to pharmacy.   # Social support: poor social support- counseled   # DISPOSITION: # refer to Chemo education ASAP- re: chemo- cisplatin weekly # refer to IR re: port placement # refer to Nutrition/speech path re: cancer/concern for malnutrition  As per IS-  # follow up in week of AUG 26th-MD-; labs- cbc/cmp;mag; phos; D-2 Cisplatin # follow up D-8 MD-; labs- cbc/cmp;mag; phos; D-2 Cisplatin-Dr.B

## 2022-10-06 NOTE — Progress Notes (Signed)
[facility] CONSULT NOTE  Patient Care Team: Patient, No Pcp Per as PCP - General (General Practice) End, Cristal Deer, MD as PCP - Cardiology (Cardiology) Carmina Miller, MD as Consulting Physician (Radiation Oncology) Earna Coder, MD as Consulting Physician (Oncology)  CHIEF COMPLAINTS/PURPOSE OF CONSULTATION: head and neck cancer  #  Oncology History Overview Note  JULY, 2024- CT scan:  1. 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 2. Metastatic lymphadenopathy at the level 2 station on the left measuring 2.7 x 2.5 x 2.1 cm. 3. Atherosclerotic calcification of the carotid bifurcations.        Cancer of base of tongue (HCC)  09/10/2022 Initial Diagnosis   Cancer of base of tongue (HCC)   10/06/2022 Cancer Staging   Staging form: Pharynx - HPV-Mediated Oropharynx, AJCC 8th Edition - Clinical: Stage II (cT2, cN2, cM0, p16+) - Signed by Earna Coder, MD on 10/06/2022 Stage prefix: Initial diagnosis   10/18/2022 -  Chemotherapy   Patient is on Treatment Plan : HEAD/NECK Cisplatin (40) q7d      HISTORY OF PRESENTING ILLNESS: Patient ambulating- with cane.   Alone.   Cory Hess 77 y.o.  male new diagnosis of left tonsil cancer is here to review the results of the Biopsy and PET scan, and subsequent plan of care.  In the interim patient-status post evaluation with radiation oncology.   C/o fluid pill causing leg cramps. C/o of hands cramping and numbness  Patient continues to have swelling/neck pain on the left side.  Patient also complains of mild to moderate pain on swallowing.    Review of Systems  Constitutional:  Negative for chills, diaphoresis, fever, malaise/fatigue and weight loss.  HENT:  Negative for nosebleeds and sore throat.   Eyes:  Negative for double vision.  Respiratory:  Negative for cough, hemoptysis, sputum production, shortness of breath and wheezing.   Cardiovascular:   Negative for chest pain, palpitations, orthopnea and leg swelling.  Gastrointestinal:  Negative for abdominal pain, blood in stool, constipation, diarrhea, heartburn, melena, nausea and vomiting.  Genitourinary:  Negative for dysuria, frequency and urgency.  Musculoskeletal:  Positive for back pain and joint pain.  Skin: Negative.  Negative for itching and rash.  Neurological:  Negative for dizziness, tingling, focal weakness, weakness and headaches.  Endo/Heme/Allergies:  Does not bruise/bleed easily.  Psychiatric/Behavioral:  Negative for depression. The patient is not nervous/anxious and does not have insomnia.      MEDICAL HISTORY:  Past Medical History:  Diagnosis Date   Alternating RBBB & LBBB    Arthritis    neck   Bladder tumor    CAD (coronary artery disease)    a. 08/2017 STEMI/Cath: LM 80ost/d, LAD 100p, LCX 95 ost/p, RCA 60p, 10m, RPDA 80ost; b. 08/2017 CABG x 3: LIMA->LAD, VG->OM, VG->PDA.   Cancer [facility])    bladder tumor   Cancer of base of tongue (HCC)    Essential hypertension    HFrEF (heart failure with reduced ejection fraction) (HCC)    a. 08/2017 TEE: EF 30-35%, sept/ant/inf HK, apical AK. 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 12/2017 Echo: EF 45-50%, diff HK. Gr1 DD. Mildly reduced RV fxn. Mild RAE.   Hyperlipidemia LDL goal <70    Ischemic cardiomyopathy    a. 08/2017 TEE: EF 30-35%; b. 12/2017 Echo: EF 45-50%.   Myocardial infarction (HCC) 08/27/2017   Shingles    2018 - right side   Vertigo    several months  ago    SURGICAL HISTORY: Past Surgical History:  Procedure Laterality Date   CARDIAC CATHETERIZATION     CATARACT EXTRACTION W/PHACO Left 07/18/2018   Procedure: CATARACT EXTRACTION PHACO AND INTRAOCULAR LENS PLACEMENT (IOC)  LEFT;  Surgeon: Nevada Crane, MD;  Location: [facility];  Service: Ophthalmology;  Laterality: Left;   CATARACT EXTRACTION W/PHACO Right 08/14/2018   Procedure: CATARACT EXTRACTION PHACO AND INTRAOCULAR LENS PLACEMENT (IOC)   RIGHT;  Surgeon: Nevada Crane, MD;  Location: [facility];  Service: Ophthalmology;  Laterality: Right;   CORONARY ARTERY BYPASS GRAFT N/A 08/27/2017   Procedure: CORONARY ARTERY BYPASS GRAFTING (CABG) ON PUMP USING LEFT INTERNAL MAMMARY ARTERY AND LEFT GREATER SAPHENOUS VEIN VIA ENDOVEIN HARVEST;  Surgeon: Alleen Borne, MD;  Location: [facility] OR;  Service: Open Heart Surgery;  Laterality: N/A;   CORONARY/GRAFT ACUTE MI REVASCULARIZATION N/A 08/27/2017   Procedure: Coronary/Graft Acute MI Revascularization;  Surgeon: Iran Ouch, MD;  Location: [facility] INVASIVE CV LAB;  Service: Cardiovascular;  Laterality: N/A;   CYSTOSCOPY W/ RETROGRADES Bilateral 12/25/2018   Procedure: CYSTOSCOPY WITH RETROGRADE PYELOGRAM;  Surgeon: Vanna Scotland, MD;  Location: [facility] ORS;  Service: Urology;  Laterality: Bilateral;   FRACTURE SURGERY Right 1958   arm and left wrist compound fracture, no metal   HERNIA REPAIR Right    inguinial   LEFT HEART CATH AND CORONARY ANGIOGRAPHY N/A 08/27/2017   Procedure: LEFT HEART CATH AND CORONARY ANGIOGRAPHY;  Surgeon: Iran Ouch, MD;  Location: [facility] INVASIVE CV LAB;  Service: Cardiovascular;  Laterality: N/A;   TRANSURETHRAL RESECTION OF BLADDER TUMOR WITH MITOMYCIN-C N/A 12/25/2018   Procedure: TRANSURETHRAL RESECTION OF BLADDER TUMOR WITH Gemcitabine;  Surgeon: Vanna Scotland, MD;  Location: [facility] ORS;  Service: Urology;  Laterality: N/A;    SOCIAL HISTORY: Social History   Socioeconomic History   Marital status: Single    Spouse name: Not on file   Number of children: Not on file   Years of education: Not on file   Highest education level: Not on file  Occupational History   Occupation: supervision, Research scientist (life sciences)    Comment: retired  Tobacco Use   Smoking status: Former    Current packs/day: 0.00    Types: Cigarettes    Quit date: 2000    Years since quitting: 24.6   Smokeless tobacco: Current    Types: Snuff  Vaping Use   Vaping  status: Never Used  Substance and Sexual Activity   Alcohol use: Not Currently   Drug use: Not Currently   Sexual activity: Not on file  Other Topics Concern   Not on file  Social History Narrative   Lives alone   Social Determinants of Health   Financial Resource Strain: Low Risk  (11/06/2018)   Overall Financial Resource Strain (CARDIA)    Difficulty of Paying Living Expenses: Not hard at all  Food Insecurity: No Food Insecurity (09/10/2022)   Hunger Vital Sign    Worried About Running Out of Food in the Last Year: Never true    Ran Out of Food in the Last Year: Never true  Transportation Needs: Not on file (09/07/2022)  Physical Activity: Inactive (11/06/2018)   Exercise Vital Sign    Days of Exercise per Week: 0 days    Minutes of Exercise per Session: 0 min  Stress: No Stress Concern Present (11/06/2018)   [employer] of Occupational Health - Occupational Stress Questionnaire    Feeling of Stress : Not at all  Social  Connections: Socially Isolated (11/06/2018)   Social Connection and Isolation Panel [NHANES]    Frequency of Communication with Friends and Family: Once a week    Frequency of Social Gatherings with Friends and Family: Once a week    Attends Religious Services: Never    Database administrator or Organizations: No    Attends Banker Meetings: Never    Marital Status: Never married  Intimate Partner Violence: Not At Risk (09/10/2022)   Humiliation, Afraid, Rape, and Kick questionnaire    Fear of Current or Ex-Partner: No    Emotionally Abused: No    Physically Abused: No    Sexually Abused: No    FAMILY HISTORY: Family History  Problem Relation Age of Onset   Heart failure Mother    Lung disease Father    Stroke Father    Cervical cancer Maternal Grandmother     ALLERGIES:  has No Known Allergies.  MEDICATIONS:  Current Outpatient Medications  Medication Sig Dispense Refill   acetaminophen (TYLENOL) 650 MG CR tablet Take 650 mg by  mouth every 8 (eight) hours as needed for pain.     aspirin EC 81 MG EC tablet Take 1 tablet (81 mg total) by mouth daily.     atorvastatin (LIPITOR) 80 MG tablet Take 1 tablet by mouth once daily 90 tablet 0   carvedilol (COREG) 12.5 MG tablet Take 1 tablet (12.5 mg total) by mouth 2 (two) times daily with a meal. PLEASE CALL OFFICE TO SCHEDULE APPOINTMENT PRIOR TO NEXT REFILL 180 tablet 0   lidocaine-prilocaine (EMLA) cream Apply on the port. 30 -45 min  prior to port access. 30 g 3   losartan (COZAAR) 100 MG tablet Take 1 tablet (100 mg total) by mouth daily. PLEASE CALL OFFICE TO SCHEDULE APPOINTMENT PRIOR TO NEXT REFILL 90 tablet 0   Multiple Vitamin (MULTIVITAMIN WITH MINERALS) TABS tablet Take 1 tablet by mouth daily. One a Day over 50/ lunch     nitroGLYCERIN (NITROSTAT) 0.4 MG SL tablet DISSOLVE ONE TABLET UNDER THE TONGUE EVERY 5 MINUTES AS NEEDED FOR CHEST PAIN.  DO NOT EXCEED A TOTAL OF 3 DOSES IN 15 MINUTES 25 tablet 0   ondansetron (ZOFRAN) 8 MG tablet One pill every 8 hours as needed for nausea/vomitting. 40 tablet 1   potassium chloride (KLOR-CON) 10 MEQ tablet Take 1 tablet (10 mEq total) by mouth daily. 90 tablet 3   prochlorperazine (COMPAZINE) 10 MG tablet Take 1 tablet (10 mg total) by mouth every 6 (six) hours as needed for nausea or vomiting. 40 tablet 1   torsemide (DEMADEX) 20 MG tablet Take 1 tablet (20 mg total) by mouth 2 (two) times daily. 180 tablet 3   No current facility-administered medications for this visit.     PHYSICAL EXAMINATION:   Vitals:   10/08/22 0903  BP: (!) 114/56  Pulse: 78  Temp: (!) 97.4 F (36.3 C)  SpO2: 98%    Filed Weights   10/08/22 0903  Weight: 239 lb (108.4 kg)     Physical Exam Vitals and nursing note reviewed.  HENT:     Head: Normocephalic and atraumatic.     Mouth/Throat:     Pharynx: Oropharynx is clear.  Eyes:     Extraocular Movements: Extraocular movements intact.     Pupils: Pupils are equal, round, and  reactive to light.  Cardiovascular:     Rate and Rhythm: Normal rate and regular rhythm.  Pulmonary:     Comments: Decreased  breath sounds bilaterally.  Abdominal:     Palpations: Abdomen is soft.  Musculoskeletal:        General: Normal range of motion.     Cervical back: Normal range of motion.  Skin:    General: Skin is warm.  Neurological:     General: No focal deficit present.     Mental Status: He is alert and oriented to person, place, and time.  Psychiatric:        Behavior: Behavior normal.        Judgment: Judgment normal.      LABORATORY DATA:  I have reviewed the data as listed Lab Results  Component Value Date   WBC 7.3 08/25/2022   HGB 14.0 08/25/2022   HCT 41.1 08/25/2022   MCV 92.6 08/25/2022   PLT 141 (L) 08/25/2022   Recent Labs    08/25/22 0947 10/05/22 1629  NA 139 141  K 4.1 4.3  CL 111 105  CO2 23 25  GLUCOSE 110* 101*  BUN 19 11  CREATININE 1.41* 1.16  CALCIUM 8.9 8.5*  GFRNONAA 52*  --   PROT 7.1  --   ALBUMIN 4.0  --   AST 26  --   ALT 19  --   ALKPHOS 53  --   BILITOT 0.9  --     RADIOGRAPHIC STUDIES: I have personally reviewed the radiological images as listed and agreed with the findings in the report. NM PET Image Initial (PI) Skull Base To Thigh (F-18 FDG)  Result Date: 09/28/2022 CLINICAL DATA:  Initial treatment strategy for oropharyngeal carcinoma. Cervical lymph node metastasis. EXAM: NUCLEAR MEDICINE PET SKULL BASE TO THIGH TECHNIQUE: 13.1 mCi F-18 FDG was injected intravenously. Full-ring PET imaging was performed from the skull base to thigh after the radiotracer. CT data was obtained and used for attenuation correction and anatomic localization. Fasting blood glucose: 93 mg/dl COMPARISON:  Neck CT of 08/25/2022. Abdominopelvic CT of 11/06/2018. FINDINGS: Mediastinal blood pool activity: SUV max 2.4 Liver activity: SUV max NA NECK: The left posterior tongue primary is hypermetabolic. Example at 3.8 x 3.8 cm and a S.U.V. max  of 21.1 on 28/4. Adjacent left level 2 nodal mass measures 2.2 x 2.7 cm and a S.U.V. max of 10.7 on 28/2. A right level 2 node measures 5 mm and a S.U.V. max of 3.3 on 29/4. Incidental CT findings: Deferred to recent diagnostic CT. Bilateral carotid atherosclerosis. Left maxillary sinus mucous retention cyst or polyp. CHEST: No pulmonary parenchymal or thoracic nodal hypermetabolism. Incidental CT findings: Median sternotomy for CABG. Cardiomegaly. Aortic atherosclerosis. ABDOMEN/PELVIS: No abdominopelvic nodal hypermetabolism. Small focus of hypermetabolism within the anterior left side of the prostate measures a S.U.V. max of 5.7 including on approximately image 163/4. Incidental CT findings: Nonspecific caudate lobe enlargement. 1.6 cm gallstone. Normal adrenal glands. Punctate left renal collecting system calculus. Right larger than left low-density renal lesions are likely cysts including up to 7.6 cm . In the absence of clinically indicated signs/symptoms require(s) no independent follow-up. Dense abdominal aortic atherosclerosis. Mild prostatomegaly. Prior right inguinal hernia repair. Fat containing left inguinal hernia. SKELETON: No abnormal marrow activity. Incidental CT findings: None. IMPRESSION: 1. Left tongue base primary with ipsilateral level 2 nodal metastasis. A right-sided diminutive level 2 node is mildly hypermetabolic and suspicious for contralateral nodal metastasis. 2. No extracervical metastatic disease identified. 3. Hypermetabolic focus within the prostate in the setting of prostatomegaly. Nonspecific. Consider correlation with PSA level. 4. Incidental findings, including: Cholelithiasis. Left nephrolithiasis. Aortic Atherosclerosis (  ICD10-I70.0). Electronically Signed   By: Jeronimo Greaves M.D.   On: 09/28/2022 08:59   ECHOCARDIOGRAM COMPLETE  Result Date: 09/27/2022    ECHOCARDIOGRAM REPORT   Patient Name:   Cory Hess Date of Exam: 09/27/2022 Medical Rec #:  [id number]     Height:        72.0 in Accession #:    [id number]    Weight:       246.0 lb Date of Birth:  Feb 08, 1946     BSA:          2.327 m Patient Age:    77 years      BP:           129/64 mmHg Patient Gender: M             HR:           81 bpm. Exam Location:  [facility] Procedure: 2D Echo, Cardiac Doppler and Color Doppler Indications:     CAD  History:         Patient has prior history of Echocardiogram examinations, most                  recent 01/03/2018. CAD and Previous Myocardial Infarction;                  Prior CABG. Known PFO.  Sonographer:     Mikki Harbor Referring Phys:  [id number] Earna Coder Diagnosing Phys: Yvonne Kendall MD  Sonographer Comments: Technically difficult study due to poor echo windows. IMPRESSIONS  1. Left ventricular ejection fraction, by estimation, is 55 to 60%. The left ventricle has normal function. Left ventricular endocardial border not optimally defined to evaluate regional wall motion. There is moderate left ventricular hypertrophy. Left ventricular diastolic parameters are consistent with Grade I diastolic dysfunction (impaired relaxation).  2. Right ventricular systolic function is normal. The right ventricular size is normal. Tricuspid regurgitation signal is inadequate for assessing PA pressure.  3. Right atrial size was mildly dilated.  4. The mitral valve is grossly normal. Mild mitral valve regurgitation.  5. The aortic valve is tricuspid. Aortic valve regurgitation is trivial. No aortic stenosis is present. FINDINGS  Left Ventricle: Left ventricular ejection fraction, by estimation, is 55 to 60%. The left ventricle has normal function. Left ventricular endocardial border not optimally defined to evaluate regional wall motion. The left ventricular internal cavity size was normal in size. There is moderate left ventricular hypertrophy. Left ventricular diastolic parameters are consistent with Grade I diastolic dysfunction (impaired relaxation). Right Ventricle: The right ventricular size  is normal. No increase in right ventricular wall thickness. Right ventricular systolic function is normal. Tricuspid regurgitation signal is inadequate for assessing PA pressure. Left Atrium: Left atrial size was normal in size. Right Atrium: Right atrial size was mildly dilated. Pericardium: The pericardium was not well visualized. Mitral Valve: The mitral valve is grossly normal. Mild mitral valve regurgitation. MV peak gradient, 5.6 mmHg. The mean mitral valve gradient is 3.0 mmHg. Tricuspid Valve: The tricuspid valve is normal in structure. Tricuspid valve regurgitation is trivial. Aortic Valve: The aortic valve is tricuspid. Aortic valve regurgitation is trivial. No aortic stenosis is present. Aortic valve mean gradient measures 4.0 mmHg. Aortic valve peak gradient measures 8.1 mmHg. Aortic valve area, by VTI measures 2.78 cm. Pulmonic Valve: The pulmonic valve was normal in structure. Pulmonic valve regurgitation is mild. No evidence of pulmonic stenosis. Aorta: The aortic root and ascending aorta are structurally normal, with no evidence of  dilitation. IAS/Shunts: The interatrial septum was not well visualized.  LEFT VENTRICLE PLAX 2D LVIDd:         5.10 cm      Diastology LVIDs:         3.40 cm      LV e' lateral:   10.10 cm/s LV PW:         1.60 cm      LV E/e' lateral: 8.5 LV IVS:        1.50 cm LVOT diam:     2.00 cm LV SV:         82 LV SV Index:   35 LVOT Area:     3.14 cm  LV Volumes (MOD) LV vol d, MOD A2C: 60.1 ml LV vol d, MOD A4C: 111.0 ml LV vol s, MOD A2C: 25.4 ml LV vol s, MOD A4C: 55.0 ml LV SV MOD A2C:     34.7 ml LV SV MOD A4C:     111.0 ml LV SV MOD BP:      51.1 ml RIGHT VENTRICLE RV Basal diam:  4.25 cm RV Mid diam:    3.60 cm RV S prime:     11.60 cm/s TAPSE (M-mode): 2.3 cm LEFT ATRIUM             Index        RIGHT ATRIUM           Index LA diam:        4.30 cm 1.85 cm/m   RA Area:     21.60 cm LA Vol (A2C):   54.0 ml 23.21 ml/m  RA Volume:   72.80 ml  31.29 ml/m LA Vol (A4C):    65.0 ml 27.94 ml/m LA Biplane Vol: 60.4 ml 25.96 ml/m  AORTIC VALVE                    PULMONIC VALVE AV Area (Vmax):    2.65 cm     PV Vmax:       1.55 m/s AV Area (Vmean):   2.78 cm     PV Peak grad:  9.6 mmHg AV Area (VTI):     2.78 cm AV Vmax:           142.00 cm/s AV Vmean:          94.400 cm/s AV VTI:            0.295 m AV Peak Grad:      8.1 mmHg AV Mean Grad:      4.0 mmHg LVOT Vmax:         120.00 cm/s LVOT Vmean:        83.500 cm/s LVOT VTI:          0.261 m LVOT/AV VTI ratio: 0.88  AORTA Ao Root diam: 3.50 cm Ao Asc diam:  3.30 cm MITRAL VALVE MV Area (PHT): 3.97 cm     SHUNTS MV Area VTI:   2.50 cm     Systemic VTI:  0.26 m MV Peak grad:  5.6 mmHg     Systemic Diam: 2.00 cm MV Mean grad:  3.0 mmHg MV Vmax:       1.18 m/s MV Vmean:      79.2 cm/s MV Decel Time: 191 msec MV E velocity: 85.40 cm/s MV A velocity: 111.00 cm/s MV E/A ratio:  0.77 Cristal Deer End MD Electronically signed by Yvonne Kendall MD Signature Date/Time: 09/27/2022/5:45:34 PM    Final    [place]  CORE BIOPSY (LYMPH NODES)  Result Date: 09/22/2022 INDICATION: Lymphadenopathy, concern for base of tongue squamous cell carcinoma EXAM: Ultrasound-guided core needle biopsy of left cervical lymph node MEDICATIONS: None. ANESTHESIA/SEDATION: Local analgesia COMPLICATIONS: None immediate. PROCEDURE: Informed written consent was obtained from the patient after a thorough discussion of the procedural risks, benefits and alternatives. All questions were addressed. Maximal Sterile Barrier Technique was utilized including caps, mask, sterile gowns, sterile gloves, sterile drape, hand hygiene and skin antiseptic. A timeout was performed prior to the initiation of the procedure. The patient was placed supine on the exam table. Ultrasound of the left neck demonstrated enlarged hypoechoic left cervical lymph node. Skin entry site was marked, and the overlying skin was prepped draped in the standard sterile fashion. Local analgesia was obtained with  1% lidocaine. Using ultrasound guidance, core needle biopsy was performed of the enlarged left cervical lymph node using an 18 gauge core biopsy device x6 total passes. Specimens were submitted in saline to pathology for further handling. Limited postprocedure imaging demonstrated no hematoma. A clean dressing was placed after manual hemostasis. The patient tolerated the procedure well without immediate complication. IMPRESSION: Successful ultrasound-guided core needle biopsy of enlarged left cervical lymph node. Electronically Signed   By: Olive Bass M.D.   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 JULY 31st, 2024-  Left tongue base primary with ipsilateral level 2 nodal metastasis. A right-sided diminutive level 2 node is mildly hypermetabolic and suspicious for contralateral nodal metastasis.   No extracervical metastatic disease identified.  # Discussed at tumor conference. Discussed with the patient that we will plan to coordinate his treatment along with start of radiation therapy- plan to start on 8/21.   # I reviewed with the patient the option of definitive chemotherapy and radiation therapy concurrently.  Usually the treatments last 6-8 week [ with weekly chemotherapy and radiation therapy offered Monday through Friday.   #  I reviewed at length the individual components with chemotherapy; and the schedule in detail.  I also discussed the potential side effects including but not limited to-increasing fatigue, nausea vomiting, diarrhea, hair loss, sores in the mouth, increase risk of infection and also neuropathy.  Also reviewed the multiple strategies to avoid/mitigate similar side effects including  preemptive medications-for nausea vomiting.  Also discussed at length regarding good oral hygiene/hydration and in general precautions regarding duration of infections.   # I discussed that surgery would be usually reserved for residual/recurrent malignancy. Discussed that would recommend reimaging with a PET scan 8 to 12 weeks post chemoradiation.  I discussed that the treatments are fairly intensive; but the goal would be cure.   # CAD s/p CABG [2019; Dr.End]- repeat 2 D echo.   # Gait instability- sec to BPPV- walks with cane/ monitor for neuropathy-   # Smoking/ Hx of smoking- quit > 25 years ago.   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  Discussed that if significant weight loss noted recommend PEG tube placement.  We will make a referral to [facility] nutrition.  #  Hypermetabolic focus within the prostate in the setting of prostatomegaly. Correlation with PSA level.  # IV access: Recommend port placement-given the frequency of the infusions.  Will refer to IR.  Patient agrees/declined.  # Chemotherapy education/nutrition evaluation: port placement. Plan start chemotherapy week of 26th- Sent the scripts for antiemetics-Zofran and Compazine; EMLA cream sent to pharmacy.   # Social support: poor social support- counseled   # DISPOSITION: # refer to Chemo education ASAP- re: chemo- cisplatin weekly # refer to IR re: port placement # refer to Nutrition/speech path re: cancer/concern for malnutrition  As per IS-  # follow up in week of AUG 26th-MD-; labs- cbc/cmp;mag; phos; D-2 Cisplatin # follow up D-8 MD-; labs- cbc/cmp;mag; phos; D-2 Cisplatin-Dr.B  All questions were answered. The patient knows to call the clinic with any problems, questions or  concerns.    Earna Coder, MD 10/08/2022 10:12 AM

## 2022-10-08 ENCOUNTER — Encounter: Payer: Self-pay | Admitting: Internal Medicine

## 2022-10-08 ENCOUNTER — Telehealth: Payer: Self-pay

## 2022-10-08 ENCOUNTER — Other Ambulatory Visit: Payer: Self-pay | Admitting: Internal Medicine

## 2022-10-08 ENCOUNTER — Inpatient Hospital Stay: Payer: Medicare Other | Admitting: Internal Medicine

## 2022-10-08 VITALS — BP 114/56 | HR 78 | Temp 97.4°F | Ht 72.0 in | Wt 239.0 lb

## 2022-10-08 DIAGNOSIS — N400 Enlarged prostate without lower urinary tract symptoms: Secondary | ICD-10-CM

## 2022-10-08 DIAGNOSIS — Z510 Encounter for antineoplastic radiation therapy: Secondary | ICD-10-CM | POA: Diagnosis not present

## 2022-10-08 DIAGNOSIS — I6523 Occlusion and stenosis of bilateral carotid arteries: Secondary | ICD-10-CM | POA: Diagnosis not present

## 2022-10-08 DIAGNOSIS — Z5111 Encounter for antineoplastic chemotherapy: Secondary | ICD-10-CM | POA: Diagnosis not present

## 2022-10-08 DIAGNOSIS — C01 Malignant neoplasm of base of tongue: Secondary | ICD-10-CM | POA: Diagnosis not present

## 2022-10-08 DIAGNOSIS — M542 Cervicalgia: Secondary | ICD-10-CM | POA: Diagnosis not present

## 2022-10-08 DIAGNOSIS — C770 Secondary and unspecified malignant neoplasm of lymph nodes of head, face and neck: Secondary | ICD-10-CM | POA: Diagnosis not present

## 2022-10-08 MED ORDER — ONDANSETRON HCL 8 MG PO TABS: ORAL_TABLET | ORAL | 1 refills | Status: DC

## 2022-10-08 MED ORDER — LIDOCAINE-PRILOCAINE 2.5-2.5 % EX CREA: TOPICAL_CREAM | CUTANEOUS | 3 refills | Status: DC

## 2022-10-08 MED ORDER — PROCHLORPERAZINE MALEATE 10 MG PO TABS: 10.0000 mg | ORAL_TABLET | Freq: Four times a day (QID) | ORAL | 1 refills | Status: DC | PRN

## 2022-10-08 NOTE — Telephone Encounter (Signed)
 Clinical Social Work was referred by medical provider for assessment of psychosocial needs.  CSW attempted to contact patient by phone.  Left voicemail with contact information and request for return call.

## 2022-10-08 NOTE — Progress Notes (Signed)
START ON PATHWAY REGIMEN - Head and Neck     A cycle is every 7 days:     Cisplatin   **Always confirm dose/schedule in your pharmacy ordering system**  Patient Characteristics: Oropharynx, HPV Positive, Preoperative or Nonsurgical Candidate (Clinical Staging), cT0-4, cN1-3 or cT3-4, cN0 Disease Classification: Oropharynx HPV Status: Positive (+) Therapeutic Status: Preoperative or Nonsurgical Candidate (Clinical Staging) AJCC T Category: cT2 AJCC 8 Stage Grouping: II AJCC N Category: cN2 AJCC M Category: cM0 Intent of Therapy: Curative Intent, Discussed with Patient

## 2022-10-08 NOTE — Progress Notes (Signed)
Bx 09/22/22.  C/o fluid pill causing leg cramps.  C/o of hands cramping and numbness.

## 2022-10-09 ENCOUNTER — Other Ambulatory Visit: Payer: Self-pay

## 2022-10-11 ENCOUNTER — Telehealth: Payer: Self-pay | Admitting: *Deleted

## 2022-10-11 ENCOUNTER — Inpatient Hospital Stay: Payer: Medicare Other

## 2022-10-11 NOTE — Telephone Encounter (Signed)
Called pt concerning port placement appointment. I left VM Friday and another today at 12:30 pm. Requesting pt to call me back to confirm.

## 2022-10-12 ENCOUNTER — Telehealth: Payer: Self-pay | Admitting: *Deleted

## 2022-10-12 ENCOUNTER — Ambulatory Visit
Admission: RE | Admit: 2022-10-12 | Discharge: 2022-10-12 | Disposition: A | Discharge status: Home / Self Care | Payer: Medicare Other | Source: Ambulatory Visit | Attending: Radiation Oncology | Admitting: Radiation Oncology

## 2022-10-12 NOTE — Telephone Encounter (Signed)
Telephone call to patient this morning. He agreed to port placement on Fri 8/23 arrival [facility]. NPO after midnight. Must have a driver for moderate sedation. He wrote everything down.

## 2022-10-13 ENCOUNTER — Other Ambulatory Visit: Payer: Self-pay

## 2022-10-13 ENCOUNTER — Ambulatory Visit
Admission: RE | Admit: 2022-10-13 | Discharge: 2022-10-13 | Disposition: A | Discharge status: Home / Self Care | Payer: Medicare Other | Source: Ambulatory Visit | Attending: Radiation Oncology | Admitting: Radiation Oncology

## 2022-10-13 ENCOUNTER — Other Ambulatory Visit (HOSPITAL_COMMUNITY): Payer: Self-pay | Admitting: Student

## 2022-10-13 ENCOUNTER — Other Ambulatory Visit: Payer: Self-pay | Admitting: Radiology

## 2022-10-13 DIAGNOSIS — Z5111 Encounter for antineoplastic chemotherapy: Secondary | ICD-10-CM | POA: Diagnosis not present

## 2022-10-13 DIAGNOSIS — I6523 Occlusion and stenosis of bilateral carotid arteries: Secondary | ICD-10-CM | POA: Diagnosis not present

## 2022-10-13 DIAGNOSIS — Z510 Encounter for antineoplastic radiation therapy: Secondary | ICD-10-CM | POA: Diagnosis not present

## 2022-10-13 DIAGNOSIS — M542 Cervicalgia: Secondary | ICD-10-CM | POA: Diagnosis not present

## 2022-10-13 DIAGNOSIS — C01 Malignant neoplasm of base of tongue: Secondary | ICD-10-CM | POA: Diagnosis not present

## 2022-10-13 DIAGNOSIS — C770 Secondary and unspecified malignant neoplasm of lymph nodes of head, face and neck: Secondary | ICD-10-CM | POA: Diagnosis not present

## 2022-10-13 LAB — RAD ONC ARIA SESSION SUMMARY
Course Elapsed Days: 0
Plan Fractions Treated to Date: 1
Plan Prescribed Dose Per Fraction: 2 Gy
Plan Total Fractions Prescribed: 35
Plan Total Prescribed Dose: 70 Gy
Reference Point Dosage Given to Date: 2 Gy
Reference Point Session Dosage Given: 2 Gy
Session Number: 1

## 2022-10-14 ENCOUNTER — Ambulatory Visit
Admission: RE | Admit: 2022-10-14 | Discharge: 2022-10-14 | Disposition: A | Discharge status: Home / Self Care | Payer: Medicare Other | Source: Ambulatory Visit | Attending: Radiation Oncology | Admitting: Radiation Oncology

## 2022-10-14 ENCOUNTER — Other Ambulatory Visit: Payer: Self-pay | Admitting: Radiology

## 2022-10-14 ENCOUNTER — Telehealth: Payer: Self-pay

## 2022-10-14 ENCOUNTER — Other Ambulatory Visit: Payer: Self-pay

## 2022-10-14 DIAGNOSIS — I6523 Occlusion and stenosis of bilateral carotid arteries: Secondary | ICD-10-CM | POA: Diagnosis not present

## 2022-10-14 DIAGNOSIS — Z5111 Encounter for antineoplastic chemotherapy: Secondary | ICD-10-CM | POA: Diagnosis not present

## 2022-10-14 DIAGNOSIS — M542 Cervicalgia: Secondary | ICD-10-CM | POA: Diagnosis not present

## 2022-10-14 DIAGNOSIS — C770 Secondary and unspecified malignant neoplasm of lymph nodes of head, face and neck: Secondary | ICD-10-CM | POA: Diagnosis not present

## 2022-10-14 DIAGNOSIS — Z510 Encounter for antineoplastic radiation therapy: Secondary | ICD-10-CM | POA: Diagnosis not present

## 2022-10-14 DIAGNOSIS — C01 Malignant neoplasm of base of tongue: Secondary | ICD-10-CM | POA: Diagnosis not present

## 2022-10-14 LAB — RAD ONC ARIA SESSION SUMMARY
Course Elapsed Days: 1
Plan Fractions Treated to Date: 2
Plan Prescribed Dose Per Fraction: 2 Gy
Plan Total Fractions Prescribed: 35
Plan Total Prescribed Dose: 70 Gy
Reference Point Dosage Given to Date: 4 Gy
Reference Point Session Dosage Given: 2 Gy
Session Number: 2

## 2022-10-14 NOTE — Progress Notes (Signed)
Patient for IR Port Insertion on Friday 10/15/2022, I called and LVM for the patient on the phone and gave pre-procedure instructions. VM made the pt aware to be here at 10a, NPO after MN prior to procedure as well as driver post procedure/recovery/discharge. Called 10/14/2022

## 2022-10-14 NOTE — Telephone Encounter (Signed)
Prior auth started thru covermymeds:  (Key: [id number]) Rx #: [id number] Lidocaine-Prilocaine 2.5-2.5% cream  Office notes attached.

## 2022-10-15 ENCOUNTER — Encounter: Payer: Self-pay | Admitting: Internal Medicine

## 2022-10-15 ENCOUNTER — Other Ambulatory Visit: Payer: Self-pay

## 2022-10-15 ENCOUNTER — Other Ambulatory Visit: Payer: Self-pay | Admitting: *Deleted

## 2022-10-15 ENCOUNTER — Ambulatory Visit: Admission: RE | Admit: 2022-10-15 | Payer: Medicare Other | Source: Ambulatory Visit

## 2022-10-15 ENCOUNTER — Ambulatory Visit
Admission: RE | Admit: 2022-10-15 | Discharge: 2022-10-15 | Disposition: A | Discharge status: Home / Self Care | Payer: Medicare Other | Source: Ambulatory Visit | Attending: Internal Medicine | Admitting: Internal Medicine

## 2022-10-15 ENCOUNTER — Encounter: Payer: Self-pay | Admitting: Radiology

## 2022-10-15 DIAGNOSIS — C01 Malignant neoplasm of base of tongue: Secondary | ICD-10-CM

## 2022-10-15 DIAGNOSIS — Z951 Presence of aortocoronary bypass graft: Secondary | ICD-10-CM | POA: Insufficient documentation

## 2022-10-15 DIAGNOSIS — C770 Secondary and unspecified malignant neoplasm of lymph nodes of head, face and neck: Secondary | ICD-10-CM | POA: Diagnosis not present

## 2022-10-15 DIAGNOSIS — I110 Hypertensive heart disease with heart failure: Secondary | ICD-10-CM | POA: Insufficient documentation

## 2022-10-15 DIAGNOSIS — Z87891 Personal history of nicotine dependence: Secondary | ICD-10-CM | POA: Diagnosis not present

## 2022-10-15 DIAGNOSIS — Z452 Encounter for adjustment and management of vascular access device: Secondary | ICD-10-CM | POA: Diagnosis not present

## 2022-10-15 DIAGNOSIS — I6523 Occlusion and stenosis of bilateral carotid arteries: Secondary | ICD-10-CM | POA: Diagnosis not present

## 2022-10-15 DIAGNOSIS — M542 Cervicalgia: Secondary | ICD-10-CM | POA: Diagnosis not present

## 2022-10-15 DIAGNOSIS — I252 Old myocardial infarction: Secondary | ICD-10-CM | POA: Diagnosis not present

## 2022-10-15 DIAGNOSIS — I5022 Chronic systolic (congestive) heart failure: Secondary | ICD-10-CM | POA: Diagnosis not present

## 2022-10-15 DIAGNOSIS — Z510 Encounter for antineoplastic radiation therapy: Secondary | ICD-10-CM | POA: Diagnosis not present

## 2022-10-15 DIAGNOSIS — I2510 Atherosclerotic heart disease of native coronary artery without angina pectoris: Secondary | ICD-10-CM | POA: Insufficient documentation

## 2022-10-15 DIAGNOSIS — Z5111 Encounter for antineoplastic chemotherapy: Secondary | ICD-10-CM | POA: Diagnosis not present

## 2022-10-15 HISTORY — PX: IR IMAGING GUIDED PORT INSERTION: IMG5740

## 2022-10-15 LAB — RAD ONC ARIA SESSION SUMMARY
Course Elapsed Days: 2
Plan Fractions Treated to Date: 3
Plan Prescribed Dose Per Fraction: 2 Gy
Plan Total Fractions Prescribed: 35
Plan Total Prescribed Dose: 70 Gy
Reference Point Dosage Given to Date: 6 Gy
Reference Point Session Dosage Given: 2 Gy
Session Number: 3

## 2022-10-15 MED ORDER — FENTANYL CITRATE (PF) 100 MCG/2ML IJ SOLN
INTRAMUSCULAR | Status: AC | PRN
  Administered 2022-10-15 (×2): 25 ug via INTRAVENOUS
  Administered 2022-10-15: 50 ug via INTRAVENOUS

## 2022-10-15 MED ORDER — MIDAZOLAM HCL 2 MG/2ML IJ SOLN
INTRAMUSCULAR | Status: AC | PRN
  Administered 2022-10-15: 1 mg via INTRAVENOUS
  Administered 2022-10-15: .5 mg via INTRAVENOUS

## 2022-10-15 MED ORDER — HEPARIN SOD (PORK) LOCK FLUSH 100 UNIT/ML IV SOLN: 500.0000 [IU] | Freq: Once | INTRAVENOUS | Status: DC

## 2022-10-15 MED ORDER — MIDAZOLAM HCL 2 MG/2ML IJ SOLN
INTRAMUSCULAR | Status: AC
  Filled 2022-10-15: qty 4

## 2022-10-15 MED ORDER — LIDOCAINE-EPINEPHRINE 1 %-1:100000 IJ SOLN
20.0000 mL | Freq: Once | INTRAMUSCULAR | Status: AC
  Administered 2022-10-15: 15 mL via INTRADERMAL

## 2022-10-15 MED ORDER — FENTANYL CITRATE (PF) 100 MCG/2ML IJ SOLN
INTRAMUSCULAR | Status: AC
  Filled 2022-10-15: qty 2

## 2022-10-15 MED ORDER — HEPARIN SOD (PORK) LOCK FLUSH 100 UNIT/ML IV SOLN
INTRAVENOUS | Status: AC
  Filled 2022-10-15: qty 5

## 2022-10-15 MED ORDER — SODIUM CHLORIDE 0.9 % IV SOLN: INTRAVENOUS | Status: DC

## 2022-10-15 MED ORDER — LIDOCAINE-EPINEPHRINE 1 %-1:100000 IJ SOLN
INTRAMUSCULAR | Status: AC
  Filled 2022-10-15: qty 1

## 2022-10-15 NOTE — Procedures (Signed)
Interventional Radiology Procedure Note  Date of Procedure: 10/15/2022  Procedure: Port placement   Findings:  1. Right chest port placement    Complications: No immediate complications noted.   Estimated Blood Loss: minimal  Follow-up and Recommendations: 1. Ready for use    Olive Bass, MD  Vascular & Interventional Radiology  10/15/2022 11:35 AM

## 2022-10-15 NOTE — Telephone Encounter (Signed)
PA Case ID #: [id number] Rx #: [id number] Approved today 10/15/22 by [facility] Medicare 2017 This drug has been approved under the Member's Medicare Part D benefit for LIDOCAINE-PRILOCAINE Cream 2.5;2.5%. Approved quantity: 30 per 30 day(s). You may fill up to a 90 day supply except for those on Specialty Tier 5, which can be filled up to a 30 day supply. Please call the pharmacy to process the prescription claim. Authorization Expiration Date: 01/13/2023

## 2022-10-15 NOTE — Discharge Instructions (Signed)
Implanted Port Home Guide  An implanted port is a type of central line that is placed under the skin. Central lines are used to provide IV access when treatment or nutrition needs to be given through a person's veins. Implanted ports are used for long-term IV access. An implanted port may be placed because: You need IV medicine that would be irritating to the small veins in your hands or arms. You need long-term IV medicines, such as antibiotics. You need IV nutrition for a long period. You need frequent blood draws for lab tests. You need dialysis.   Implanted ports are usually placed in the chest area, but they can also be placed in the upper arm, the abdomen, or the leg. An implanted port has two main parts: Reservoir. The reservoir is round and will appear as a small, raised area under your skin. The reservoir is the part where a needle is inserted to give medicines or draw blood. Catheter. The catheter is a thin, flexible tube that extends from the reservoir. The catheter is placed into a large vein. Medicine that is inserted into the reservoir goes into the catheter and then into the vein.   How will I care for my incision  You may shower tomorrow Please remove dressing in 24hrs not other skin care is needed  How is my port accessed? Special steps must be taken to access the port: Before the port is accessed, a numbing cream can be placed on the skin. This helps numb the skin over the port site. Your health care provider uses a sterile technique to access the port. Your health care provider must put on a mask and sterile gloves. The skin over your port is cleaned carefully with an antiseptic and allowed to dry. The port is gently pinched between sterile gloves, and a needle is inserted into the port. Only "non-coring" port needles should be used to access the port. Once the port is accessed, a blood return should be checked. This helps ensure that the port is in the vein and is not  clogged. If your port needs to remain accessed for a constant infusion, a clear (transparent) bandage will be placed over the needle site. The bandage and needle will need to be changed every week, or as directed by your health care provider.   What is flushing? Flushing helps keep the port from getting clogged. Follow your health care provider's instructions on how and when to flush the port. Ports are usually flushed with saline solution or a medicine called heparin. The need for flushing will depend on how the port is used. If the port is used for intermittent medicines or blood draws, the port will need to be flushed: After medicines have been given. After blood has been drawn. As part of routine maintenance. If a constant infusion is running, the port may not need to be flushed.   How long will my port stay implanted? The port can stay in for as long as your health care provider thinks it is needed. When it is time for the port to come out, surgery will be done to remove it. The procedure is similar to the one performed when the port was put in. When should I seek immediate medical care? When you have an implanted port, you should seek immediate medical care if: You notice a bad smell coming from the incision site. You have swelling, redness, or drainage at the incision site. You have more swelling or pain at the   port site or the surrounding area. You have a fever that is not controlled with medicine.   This information is not intended to replace advice given to you by your health care provider. Make sure you discuss any questions you have with your health care provider. Document Released: 02/08/2005 Document Revised: 07/17/2015 Document Reviewed: 10/16/2012 [employer] Interactive Patient Education  2017 [employer]   

## 2022-10-15 NOTE — H&P (Signed)
Chief Complaint: Patient was seen in consultation today for port placement  Referring Physician(s): Brahmanday,Govinda R  Supervising Physician: Pernell Dupre  Patient Status: [facility] - Out-pt  History of Present Illness: Cory Hess is a 77 y.o. male with PMH significant for alternating RBBB and LBBB, chronic ankle swelling, bladder tumor, CAD with STEMI in 2019 s/p CABG x3, hypertension, and HFrEF being seen today for image-guided port placement secondary to cancer of the base of tongue. Patient is known to IR from image-guided core cervical lymph node biopsy on 09/22/22 which revealed cancer of the base of tongue. Patient is now being followed by Dr Donneta Romberg from Oncology service who has referred patient to IR for image-guided port placement.   Past Medical History:  Diagnosis Date   Alternating RBBB & LBBB    Ankle swelling 2024   Arthritis    neck   Bladder tumor    CAD (coronary artery disease)    a. 08/2017 STEMI/Cath: LM 80ost/d, LAD 100p, LCX 95 ost/p, RCA 60p, 73m, RPDA 80ost; b. 08/2017 CABG x 3: LIMA->LAD, VG->OM, VG->PDA.   Cancer [facility])    bladder tumor   Cancer of base of tongue (HCC)    Essential hypertension    HFrEF (heart failure with reduced ejection fraction) (HCC)    a. 08/2017 TEE: EF 30-35%, sept/ant/inf HK, apical AK. 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 12/2017 Echo: EF 45-50%, diff HK. Gr1 DD. Mildly reduced RV fxn. Mild RAE.   Hyperlipidemia LDL goal <70    Ischemic cardiomyopathy    a. 08/2017 TEE: EF 30-35%; b. 12/2017 Echo: EF 45-50%.   Myocardial infarction (HCC) 08/27/2017   Shingles    2018 - right side   Vertigo    several months ago    Past Surgical History:  Procedure Laterality Date   CARDIAC CATHETERIZATION     CATARACT EXTRACTION W/PHACO Left 07/18/2018   Procedure: CATARACT EXTRACTION PHACO AND INTRAOCULAR LENS PLACEMENT (IOC)  LEFT;  Surgeon: Nevada Crane, MD;  Location: [facility];  Service: Ophthalmology;  Laterality:  Left;   CATARACT EXTRACTION W/PHACO Right 08/14/2018   Procedure: CATARACT EXTRACTION PHACO AND INTRAOCULAR LENS PLACEMENT (IOC)  RIGHT;  Surgeon: Nevada Crane, MD;  Location: [facility];  Service: Ophthalmology;  Laterality: Right;   CORONARY ARTERY BYPASS GRAFT N/A 08/27/2017   Procedure: CORONARY ARTERY BYPASS GRAFTING (CABG) ON PUMP USING LEFT INTERNAL MAMMARY ARTERY AND LEFT GREATER SAPHENOUS VEIN VIA ENDOVEIN HARVEST;  Surgeon: Alleen Borne, MD;  Location: [facility] OR;  Service: Open Heart Surgery;  Laterality: N/A;   CORONARY/GRAFT ACUTE MI REVASCULARIZATION N/A 08/27/2017   Procedure: Coronary/Graft Acute MI Revascularization;  Surgeon: Iran Ouch, MD;  Location: [facility] INVASIVE CV LAB;  Service: Cardiovascular;  Laterality: N/A;   CYSTOSCOPY W/ RETROGRADES Bilateral 12/25/2018   Procedure: CYSTOSCOPY WITH RETROGRADE PYELOGRAM;  Surgeon: Vanna Scotland, MD;  Location: [facility] ORS;  Service: Urology;  Laterality: Bilateral;   FRACTURE SURGERY Right 1958   arm and left wrist compound fracture, no metal   HERNIA REPAIR Right    inguinial   LEFT HEART CATH AND CORONARY ANGIOGRAPHY N/A 08/27/2017   Procedure: LEFT HEART CATH AND CORONARY ANGIOGRAPHY;  Surgeon: Iran Ouch, MD;  Location: [facility] INVASIVE CV LAB;  Service: Cardiovascular;  Laterality: N/A;   TRANSURETHRAL RESECTION OF BLADDER TUMOR WITH MITOMYCIN-C N/A 12/25/2018   Procedure: TRANSURETHRAL RESECTION OF BLADDER TUMOR WITH Gemcitabine;  Surgeon: Vanna Scotland, MD;  Location: [facility] ORS;  Service: Urology;  Laterality:  N/A;    Allergies: Patient has no known allergies.  Medications: Prior to Admission medications   Medication Sig Start Date End Date Taking? Authorizing Provider  acetaminophen (TYLENOL) 650 MG CR tablet Take 650 mg by mouth every 8 (eight) hours as needed for pain.   Yes [provider]  aspirin EC 81 MG EC tablet Take 1 tablet (81 mg total) by mouth daily. 09/03/17  Yes Gold, Glenice Laine, PA-C   atorvastatin (LIPITOR) 80 MG tablet Take 1 tablet by mouth once daily 08/09/22  Yes Creig Hines, NP  carvedilol (COREG) 12.5 MG tablet Take 1 tablet (12.5 mg total) by mouth 2 (two) times daily with a meal. PLEASE CALL OFFICE TO SCHEDULE APPOINTMENT PRIOR TO NEXT REFILL 09/07/22  Yes End, Cristal Deer, MD  losartan (COZAAR) 100 MG tablet Take 1 tablet (100 mg total) by mouth daily. PLEASE CALL OFFICE TO SCHEDULE APPOINTMENT PRIOR TO NEXT REFILL 09/07/22  Yes End, Cristal Deer, MD  Multiple Vitamin (MULTIVITAMIN WITH MINERALS) TABS tablet Take 1 tablet by mouth daily. One a Day over 50/ lunch   Yes [provider]  lidocaine-prilocaine (EMLA) cream Apply on the port. 30 -45 min  prior to port access. 10/08/22   Earna Coder, MD  nitroGLYCERIN (NITROSTAT) 0.4 MG SL tablet DISSOLVE ONE TABLET UNDER THE TONGUE EVERY 5 MINUTES AS NEEDED FOR CHEST PAIN.  DO NOT EXCEED A TOTAL OF 3 DOSES IN 15 MINUTES 08/27/21   Creig Hines, NP  ondansetron [facility]) 8 MG tablet One pill every 8 hours as needed for nausea/vomitting. 10/08/22   Earna Coder, MD  potassium chloride (KLOR-CON) 10 MEQ tablet Take 1 tablet (10 mEq total) by mouth daily. 10/05/22 01/03/23  Antonieta Iba, MD  prochlorperazine (COMPAZINE) 10 MG tablet Take 1 tablet (10 mg total) by mouth every 6 (six) hours as needed for nausea or vomiting. 10/08/22   Earna Coder, MD  torsemide (DEMADEX) 20 MG tablet Take 1 tablet (20 mg total) by mouth 2 (two) times daily. 10/05/22 01/03/23  Antonieta Iba, MD     Family History  Problem Relation Age of Onset   Heart failure Mother    Lung disease Father    Stroke Father    Cervical cancer Maternal Grandmother     Social History   Socioeconomic History   Marital status: Single    Spouse name: Not on file   Number of children: Not on file   Years of education: Not on file   Highest education level: Not on file  Occupational History    Occupation: supervision, Research scientist (life sciences)    Comment: retired  Tobacco Use   Smoking status: Former    Current packs/day: 0.00    Types: Cigarettes    Quit date: 2000    Years since quitting: 24.6   Smokeless tobacco: Current    Types: Snuff  Vaping Use   Vaping status: Never Used  Substance and Sexual Activity   Alcohol use: Not Currently   Drug use: Not Currently   Sexual activity: Not on file  Other Topics Concern   Not on file  Social History Narrative   Lives alone   Social Determinants of Health   Financial Resource Strain: Low Risk  (11/06/2018)   Overall Financial Resource Strain (CARDIA)    Difficulty of Paying Living Expenses: Not hard at all  Food Insecurity: No Food Insecurity (09/10/2022)   Hunger Vital Sign    Worried About Running Out of Food  in the Last Year: Never true    Ran Out of Food in the Last Year: Never true  Transportation Needs: Not on file (09/07/2022)  Physical Activity: Inactive (11/06/2018)   Exercise Vital Sign    Days of Exercise per Week: 0 days    Minutes of Exercise per Session: 0 min  Stress: No Stress Concern Present (11/06/2018)   [employer] of Occupational Health - Occupational Stress Questionnaire    Feeling of Stress : Not at all  Social Connections: Socially Isolated (11/06/2018)   Social Connection and Isolation Panel [NHANES]    Frequency of Communication with Friends and Family: Once a week    Frequency of Social Gatherings with Friends and Family: Once a week    Attends Religious Services: Never    Database administrator or Organizations: No    Attends Engineer, structural: Never    Marital Status: Never married    Code Status: Full code  Review of Systems: A 12 point ROS discussed and pertinent positives are indicated in the HPI above.  All other systems are negative.  Review of Systems  Constitutional:  Negative for chills and fever.  Respiratory:  Negative for chest tightness and shortness of  breath.   Cardiovascular:  Positive for leg swelling. Negative for chest pain.       Chronic ankle swelling  Gastrointestinal:  Positive for diarrhea. Negative for abdominal pain, nausea and vomiting.  Neurological:  Positive for headaches. Negative for dizziness.  Psychiatric/Behavioral:  Negative for confusion.     Vital Signs: BP (!) 155/96   Pulse 89   Temp 98.1 F (36.7 C) (Oral)   Resp (!) 24   Ht 6' (1.829 m)   Wt 243 lb (110.2 kg)   SpO2 97%   BMI 32.96 kg/m    Physical Exam Vitals reviewed.  Constitutional:      General: He is not in acute distress.    Appearance: He is ill-appearing.  Cardiovascular:     Rate and Rhythm: Normal rate and regular rhythm.     Pulses: Normal pulses.     Heart sounds: Normal heart sounds.  Pulmonary:     Effort: Pulmonary effort is normal.     Breath sounds: Normal breath sounds.  Abdominal:     Palpations: Abdomen is soft.     Tenderness: There is no abdominal tenderness.  Musculoskeletal:     Right lower leg: Edema present.     Left lower leg: Edema present.     Comments: Bilateral non-pitting lower extremity edema  Neurological:     Mental Status: He is alert and oriented to person, place, and time.  Psychiatric:        Mood and Affect: Mood normal.        Behavior: Behavior normal.        Thought Content: Thought content normal.        Judgment: Judgment normal.     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 EXAM: NUCLEAR MEDICINE PET SKULL BASE TO THIGH TECHNIQUE: 13.1 mCi F-18 FDG was injected intravenously. Full-ring PET imaging was performed from the skull base to thigh after the radiotracer. CT data was obtained and used for attenuation correction and anatomic localization. Fasting blood glucose: 93 mg/dl COMPARISON:  Neck CT of 08/25/2022. Abdominopelvic CT of  11/06/2018. FINDINGS: Mediastinal blood pool activity: SUV max  2.4 Liver activity: SUV max NA NECK: The left posterior tongue primary is hypermetabolic. Example at 3.8 x 3.8 cm and a S.U.V. max of 21.1 on 28/4. Adjacent left level 2 nodal mass measures 2.2 x 2.7 cm and a S.U.V. max of 10.7 on 28/2. A right level 2 node measures 5 mm and a S.U.V. max of 3.3 on 29/4. Incidental CT findings: Deferred to recent diagnostic CT. Bilateral carotid atherosclerosis. Left maxillary sinus mucous retention cyst or polyp. CHEST: No pulmonary parenchymal or thoracic nodal hypermetabolism. Incidental CT findings: Median sternotomy for CABG. Cardiomegaly. Aortic atherosclerosis. ABDOMEN/PELVIS: No abdominopelvic nodal hypermetabolism. Small focus of hypermetabolism within the anterior left side of the prostate measures a S.U.V. max of 5.7 including on approximately image 163/4. Incidental CT findings: Nonspecific caudate lobe enlargement. 1.6 cm gallstone. Normal adrenal glands. Punctate left renal collecting system calculus. Right larger than left low-density renal lesions are likely cysts including up to 7.6 cm . In the absence of clinically indicated signs/symptoms require(s) no independent follow-up. Dense abdominal aortic atherosclerosis. Mild prostatomegaly. Prior right inguinal hernia repair. Fat containing left inguinal hernia. SKELETON: No abnormal marrow activity. Incidental CT findings: None. IMPRESSION: 1. Left tongue base primary with ipsilateral level 2 nodal metastasis. A right-sided diminutive level 2 node is mildly hypermetabolic and suspicious for contralateral nodal metastasis. 2. No extracervical metastatic disease identified. 3. Hypermetabolic focus within the prostate in the setting of prostatomegaly. Nonspecific. Consider correlation with PSA level. 4. Incidental findings, including: Cholelithiasis. Left nephrolithiasis. Aortic Atherosclerosis (ICD10-I70.0). Electronically Signed   By: Jeronimo Greaves M.D.   On:  09/28/2022 08:59   ECHOCARDIOGRAM COMPLETE  Result Date: 09/27/2022    ECHOCARDIOGRAM REPORT   Patient Name:   Cory Hess Date of Exam: 09/27/2022 Medical Rec #:  [id number]     Height:       72.0 in Accession #:    [id number]    Weight:       246.0 lb Date of Birth:  Nov 18, 1945     BSA:          2.327 m Patient Age:    77 years      BP:           129/64 mmHg Patient Gender: M             HR:           81 bpm. Exam Location:  [facility] Procedure: 2D Echo, Cardiac Doppler and Color Doppler Indications:     CAD  History:         Patient has prior history of Echocardiogram examinations, most                  recent 01/03/2018. CAD and Previous Myocardial Infarction;                  Prior CABG. Known PFO.  Sonographer:     Mikki Harbor Referring Phys:  [id number] Earna Coder Diagnosing Phys: Yvonne Kendall MD  Sonographer Comments: Technically difficult study due to poor echo windows. IMPRESSIONS  1. Left ventricular ejection fraction, by estimation, is 55 to 60%. The left ventricle has normal function. Left ventricular endocardial border not optimally defined to evaluate regional wall motion. There is moderate left ventricular hypertrophy. Left ventricular diastolic parameters are consistent with Grade I diastolic dysfunction (impaired relaxation).  2. Right ventricular systolic function is normal. The right ventricular size is normal. Tricuspid regurgitation signal is inadequate for assessing PA pressure.  3. Right atrial size was  mildly dilated.  4. The mitral valve is grossly normal. Mild mitral valve regurgitation.  5. The aortic valve is tricuspid. Aortic valve regurgitation is trivial. No aortic stenosis is present. FINDINGS  Left Ventricle: Left ventricular ejection fraction, by estimation, is 55 to 60%. The left ventricle has normal function. Left ventricular endocardial border not optimally defined to evaluate regional wall motion. The left ventricular internal cavity size was normal in size. There  is moderate left ventricular hypertrophy. Left ventricular diastolic parameters are consistent with Grade I diastolic dysfunction (impaired relaxation). Right Ventricle: The right ventricular size is normal. No increase in right ventricular wall thickness. Right ventricular systolic function is normal. Tricuspid regurgitation signal is inadequate for assessing PA pressure. Left Atrium: Left atrial size was normal in size. Right Atrium: Right atrial size was mildly dilated. Pericardium: The pericardium was not well visualized. Mitral Valve: The mitral valve is grossly normal. Mild mitral valve regurgitation. MV peak gradient, 5.6 mmHg. The mean mitral valve gradient is 3.0 mmHg. Tricuspid Valve: The tricuspid valve is normal in structure. Tricuspid valve regurgitation is trivial. Aortic Valve: The aortic valve is tricuspid. Aortic valve regurgitation is trivial. No aortic stenosis is present. Aortic valve mean gradient measures 4.0 mmHg. Aortic valve peak gradient measures 8.1 mmHg. Aortic valve area, by VTI measures 2.78 cm. Pulmonic Valve: The pulmonic valve was normal in structure. Pulmonic valve regurgitation is mild. No evidence of pulmonic stenosis. Aorta: The aortic root and ascending aorta are structurally normal, with no evidence of dilitation. IAS/Shunts: The interatrial septum was not well visualized.  LEFT VENTRICLE PLAX 2D LVIDd:         5.10 cm      Diastology LVIDs:         3.40 cm      LV e' lateral:   10.10 cm/s LV PW:         1.60 cm      LV E/e' lateral: 8.5 LV IVS:        1.50 cm LVOT diam:     2.00 cm LV SV:         82 LV SV Index:   35 LVOT Area:     3.14 cm  LV Volumes (MOD) LV vol d, MOD A2C: 60.1 ml LV vol d, MOD A4C: 111.0 ml LV vol s, MOD A2C: 25.4 ml LV vol s, MOD A4C: 55.0 ml LV SV MOD A2C:     34.7 ml LV SV MOD A4C:     111.0 ml LV SV MOD BP:      51.1 ml RIGHT VENTRICLE RV Basal diam:  4.25 cm RV Mid diam:    3.60 cm RV S prime:     11.60 cm/s TAPSE (M-mode): 2.3 cm LEFT ATRIUM              Index        RIGHT ATRIUM           Index LA diam:        4.30 cm 1.85 cm/m   RA Area:     21.60 cm LA Vol (A2C):   54.0 ml 23.21 ml/m  RA Volume:   72.80 ml  31.29 ml/m LA Vol (A4C):   65.0 ml 27.94 ml/m LA Biplane Vol: 60.4 ml 25.96 ml/m  AORTIC VALVE                    PULMONIC VALVE AV Area (Vmax):    2.65 cm     PV  Vmax:       1.55 m/s AV Area (Vmean):   2.78 cm     PV Peak grad:  9.6 mmHg AV Area (VTI):     2.78 cm AV Vmax:           142.00 cm/s AV Vmean:          94.400 cm/s AV VTI:            0.295 m AV Peak Grad:      8.1 mmHg AV Mean Grad:      4.0 mmHg LVOT Vmax:         120.00 cm/s LVOT Vmean:        83.500 cm/s LVOT VTI:          0.261 m LVOT/AV VTI ratio: 0.88  AORTA Ao Root diam: 3.50 cm Ao Asc diam:  3.30 cm MITRAL VALVE MV Area (PHT): 3.97 cm     SHUNTS MV Area VTI:   2.50 cm     Systemic VTI:  0.26 m MV Peak grad:  5.6 mmHg     Systemic Diam: 2.00 cm MV Mean grad:  3.0 mmHg MV Vmax:       1.18 m/s MV Vmean:      79.2 cm/s MV Decel Time: 191 msec MV E velocity: 85.40 cm/s MV A velocity: 111.00 cm/s MV E/A ratio:  0.77 Cristal Deer End MD Electronically signed by Yvonne Kendall MD Signature Date/Time: 09/27/2022/5:45:34 PM    Final    [place] CORE BIOPSY (LYMPH NODES)  Result Date: 09/22/2022 INDICATION: Lymphadenopathy, concern for base of tongue squamous cell carcinoma EXAM: Ultrasound-guided core needle biopsy of left cervical lymph node MEDICATIONS: None. ANESTHESIA/SEDATION: Local analgesia COMPLICATIONS: None immediate. PROCEDURE: Informed written consent was obtained from the patient after a thorough discussion of the procedural risks, benefits and alternatives. All questions were addressed. Maximal Sterile Barrier Technique was utilized including caps, mask, sterile gowns, sterile gloves, sterile drape, hand hygiene and skin antiseptic. A timeout was performed prior to the initiation of the procedure. The patient was placed supine on the exam table. Ultrasound of the left neck  demonstrated enlarged hypoechoic left cervical lymph node. Skin entry site was marked, and the overlying skin was prepped draped in the standard sterile fashion. Local analgesia was obtained with 1% lidocaine. Using ultrasound guidance, core needle biopsy was performed of the enlarged left cervical lymph node using an 18 gauge core biopsy device x6 total passes. Specimens were submitted in saline to pathology for further handling. Limited postprocedure imaging demonstrated no hematoma. A clean dressing was placed after manual hemostasis. The patient tolerated the procedure well without immediate complication. IMPRESSION: Successful ultrasound-guided core needle biopsy of enlarged left cervical lymph node. Electronically Signed   By: Olive Bass M.D.   On: 09/22/2022 14:48    Labs:  CBC: Recent Labs    08/25/22 0947  WBC 7.3  HGB 14.0  HCT 41.1  PLT 141*    COAGS: No results for input(s): "INR", "APTT" in the last 8760 hours.  BMP: Recent Labs    08/25/22 0947 10/05/22 1629  NA 139 141  K 4.1 4.3  CL 111 105  CO2 23 25  GLUCOSE 110* 101*  BUN 19 11  CALCIUM 8.9 8.5*  CREATININE 1.41* 1.16  GFRNONAA 52*  --     LIVER FUNCTION TESTS: Recent Labs    08/25/22 0947  BILITOT 0.9  AST 26  ALT 19  ALKPHOS 53  PROT 7.1  ALBUMIN 4.0  TUMOR MARKERS: No results for input(s): "AFPTM", "CEA", "CA199", "CHROMGRNA" in the last 8760 hours.  Assessment and Plan:  Cory Hess is a 77 yo male being seen today for port placement secondary to cancer of the base of tongue. Patient presents today in his usual state of health and is NPO. Case has been reviewed with Dr Juliette Alcide and is scheduled to proceed on 10/15/22.  Risks and benefits of image guided port-a-catheter placement was discussed with the patient including, but not limited to bleeding, infection, pneumothorax, or fibrin sheath development and need for additional procedures.  All of the patient's questions were answered,  patient is agreeable to proceed. Consent signed and in chart.   Thank you for this interesting consult.  I greatly enjoyed meeting Kyreese Ansell and look forward to participating in their care.  A copy of this report was sent to the requesting provider on this date.  Electronically Signed: Kennieth Francois, PA-C 10/15/2022, 10:34 AM   I spent a total of  15 Minutes in face to face in clinical consultation, greater than 50% of which was counseling/coordinating care for port placement.

## 2022-10-18 ENCOUNTER — Inpatient Hospital Stay (HOSPITAL_BASED_OUTPATIENT_CLINIC_OR_DEPARTMENT_OTHER): Payer: Medicare Other | Admitting: Internal Medicine

## 2022-10-18 ENCOUNTER — Encounter: Payer: Self-pay | Admitting: Internal Medicine

## 2022-10-18 ENCOUNTER — Other Ambulatory Visit: Payer: Self-pay

## 2022-10-18 ENCOUNTER — Inpatient Hospital Stay: Payer: Medicare Other

## 2022-10-18 ENCOUNTER — Ambulatory Visit
Admission: RE | Admit: 2022-10-18 | Discharge: 2022-10-18 | Disposition: A | Discharge status: Home / Self Care | Payer: Medicare Other | Source: Ambulatory Visit | Attending: Radiation Oncology | Admitting: Radiation Oncology

## 2022-10-18 DIAGNOSIS — C01 Malignant neoplasm of base of tongue: Secondary | ICD-10-CM | POA: Diagnosis not present

## 2022-10-18 DIAGNOSIS — I6523 Occlusion and stenosis of bilateral carotid arteries: Secondary | ICD-10-CM | POA: Diagnosis not present

## 2022-10-18 DIAGNOSIS — M542 Cervicalgia: Secondary | ICD-10-CM | POA: Diagnosis not present

## 2022-10-18 DIAGNOSIS — Z510 Encounter for antineoplastic radiation therapy: Secondary | ICD-10-CM | POA: Diagnosis not present

## 2022-10-18 DIAGNOSIS — Z6833 Body mass index (BMI) 33.0-33.9, adult: Secondary | ICD-10-CM | POA: Diagnosis not present

## 2022-10-18 DIAGNOSIS — Z5111 Encounter for antineoplastic chemotherapy: Secondary | ICD-10-CM | POA: Diagnosis not present

## 2022-10-18 DIAGNOSIS — C770 Secondary and unspecified malignant neoplasm of lymph nodes of head, face and neck: Secondary | ICD-10-CM | POA: Diagnosis not present

## 2022-10-18 DIAGNOSIS — N400 Enlarged prostate without lower urinary tract symptoms: Secondary | ICD-10-CM

## 2022-10-18 LAB — CBC WITH DIFFERENTIAL (CANCER CENTER ONLY)
Abs Immature Granulocytes: 0.02 10*3/uL (ref 0.00–0.07)
Basophils Absolute: 0 10*3/uL (ref 0.0–0.1)
Basophils Relative: 1 %
Eosinophils Absolute: 0.2 10*3/uL (ref 0.0–0.5)
Eosinophils Relative: 3 %
HCT: 37.8 % — ABNORMAL LOW (ref 39.0–52.0)
Hemoglobin: 12.6 g/dL — ABNORMAL LOW (ref 13.0–17.0)
Immature Granulocytes: 0 %
Lymphocytes Relative: 12 %
Lymphs Abs: 0.9 10*3/uL (ref 0.7–4.0)
MCH: 31 pg (ref 26.0–34.0)
MCHC: 33.3 g/dL (ref 30.0–36.0)
MCV: 92.9 fL (ref 80.0–100.0)
Monocytes Absolute: 0.8 10*3/uL (ref 0.1–1.0)
Monocytes Relative: 11 %
Neutro Abs: 5.5 10*3/uL (ref 1.7–7.7)
Neutrophils Relative %: 73 %
Platelet Count: 169 10*3/uL (ref 150–400)
RBC: 4.07 MIL/uL — ABNORMAL LOW (ref 4.22–5.81)
RDW: 12.3 % (ref 11.5–15.5)
WBC Count: 7.4 10*3/uL (ref 4.0–10.5)
nRBC: 0 % (ref 0.0–0.2)

## 2022-10-18 LAB — BASIC METABOLIC PANEL - CANCER CENTER ONLY
Anion gap: 5 (ref 5–15)
BUN: 15 mg/dL (ref 8–23)
CO2: 23 mmol/L (ref 22–32)
Calcium: 8.5 mg/dL — ABNORMAL LOW (ref 8.9–10.3)
Chloride: 109 mmol/L (ref 98–111)
Creatinine: 0.97 mg/dL (ref 0.61–1.24)
GFR, Estimated: >60 mL/min (ref >60)
Glucose, Bld: 122 mg/dL — ABNORMAL HIGH (ref 70–99)
Potassium: 4.2 mmol/L (ref 3.5–5.1)
Sodium: 137 mmol/L (ref 135–145)

## 2022-10-18 LAB — RAD ONC ARIA SESSION SUMMARY
Course Elapsed Days: 5
Plan Fractions Treated to Date: 4
Plan Prescribed Dose Per Fraction: 2 Gy
Plan Total Fractions Prescribed: 35
Plan Total Prescribed Dose: 70 Gy
Reference Point Dosage Given to Date: 8 Gy
Reference Point Session Dosage Given: 2 Gy
Session Number: 4

## 2022-10-18 LAB — PHOSPHORUS: Phosphorus: 2.7 mg/dL (ref 2.5–4.6)

## 2022-10-18 LAB — PSA: Prostatic Specific Antigen: 1.07 ng/mL (ref 0.00–4.00)

## 2022-10-18 LAB — MAGNESIUM: Magnesium: 2.1 mg/dL (ref 1.7–2.4)

## 2022-10-18 MED ORDER — TRAMADOL HCL 50 MG PO TABS: 50.0000 mg | ORAL_TABLET | Freq: Three times a day (TID) | ORAL | 0 refills | Status: DC | PRN

## 2022-10-18 MED FILL — Fosaprepitant Dimeglumine For IV Infusion 150 MG (Base Eq): INTRAVENOUS | Qty: 5 | Status: AC

## 2022-10-18 MED FILL — Dexamethasone Sodium Phosphate Inj 100 MG/10ML: INTRAMUSCULAR | Qty: 1 | Status: AC

## 2022-10-18 NOTE — Patient Instructions (Signed)
#  Recommend calcium 1200 plus vitamin D-3 1000-over-the-counter-1 pill a day.

## 2022-10-18 NOTE — Progress Notes (Signed)
C/o pain in chest from port placement last Friday. And pain left throat/ear/head area, pain 10/10. Taking otc tylenol. Ibuprofen hurts his stomach. Asking for something for pain. Pain keeps him from sleeping.  Since radiation no taste. Appetite is at 75%, eating a lot of chicken noodle soup.  Swelling both feet and legs.  Are there any diet restrictions when coming to have chemo?

## 2022-10-18 NOTE — Assessment & Plan Note (Addendum)
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 JULY 31st, 2024-  Left tongue base primary with ipsilateral level 2 nodal metastasis. A right-sided diminutive level 2 node is mildly hypermetabolic and suspicious for contralateral nodal metastasis.   No extracervical metastatic disease identified. Plan cisplatin weekly x6-8; with concurrent RT [last oct 9th, 2024]  # Proceed with cisplatin- weekly cisplatin #1/ of planned 6-8 cycles. Discussed that would recommend reimaging with a PET scan 8 to 12 weeks post chemoradiation.  #  I reviewed at length the individual components with chemotherapy; and the schedule in detail.  I also discussed the potential side effects including but not limited to-increasing fatigue, nausea vomiting, diarrhea, hair loss, sores in the mouth, increase risk of infection and also neuropathy.  Also reviewed the multiple strategies to avoid/mitigate similar side effects including preemptive medications-for nausea vomiting.  Also discussed at length regarding good oral hygiene/hydration and in general precautions regarding duration of infections.   # Pain from incisional /Bx- recommend Tramadol prn/scipt sent.   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 Stable.   # Gait instability- sec to BPPV- walks with cane/ monitor for neuropathy-   # Smoking/ Hx of smoking- quit > 25 years ago.   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 Discussed regarding low calcium. Recommend calcium 1200 plus vitamin D-3 1000-over-the-counter-1 pill a day.   Will also check vitamin D levels.   # Baseline weight:[240-250- July-AUG, 2024] I discussed at length the risk of weight loss / difficulty swallowing-counseled the patient regarding potential weight loss; moderate to  severe mucositis from chemoradiation.  Discussed that if significant weight loss noted recommend PEG tube placement.  Awaiting evaluation with  [facility].  # Hypermetabolic focus within the prostate in the setting of prostatomegaly. Correlation with PSA level.  # IV access: s/p port placement-- functional-   # Social support: poor social support- counseled   # DISPOSITION: As per IS-  Chemo tomorrow-  # follow up in 1 week-MD-; labs- cbc/cmp;mag; vit D 25-OH; phos; D-2 Cisplatin # follow up in 2 weeksMD-; labs- cbc/cmp;mag; phos; D-2 Cisplatin-Dr.B  # 40 minutes face-to-face with the patient discussing the above plan of care; more than 50% of time spent on prognosis/ natural history; counseling and coordination.

## 2022-10-18 NOTE — Progress Notes (Signed)
[name] CONSULT NOTE  Patient Care Team: Patient, No Pcp Per as PCP - General (General Practice) End, Cory Deer, MD as PCP - Cardiology (Cardiology) Carmina Miller, MD as Consulting Physician (Radiation Oncology) Earna Coder, MD as Consulting Physician (Oncology)  CHIEF COMPLAINTS/PURPOSE OF CONSULTATION: head and neck cancer  #  Oncology History Overview Note  JULY, 2024- CT scan:  1. 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 2. Metastatic lymphadenopathy at the level 2 station on the left measuring 2.7 x 2.5 x 2.1 cm. 3. Atherosclerotic calcification of the carotid bifurcations.  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   Alone.   Cory Hess 77 y.o.  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  Patient currently starting radiation.  Tolerating it fairly well except Since radiation no taste. Appetite is at 75%, eating a lot of chicken noodle soup.   c/o pain in chest from port placement last Friday. And pain left throat/ear/head area, pain 10/10. Taking otc tylenol.    Review of Systems  Constitutional:  Negative for chills, diaphoresis, fever, malaise/fatigue and weight loss.  HENT:  Negative for nosebleeds and sore throat.   Eyes:  Negative for double vision.  Respiratory:   Negative for cough, hemoptysis, sputum production, shortness of breath and wheezing.   Cardiovascular:  Negative for chest pain, palpitations, orthopnea and leg swelling.  Gastrointestinal:  Negative for abdominal pain, blood in stool, constipation, diarrhea, heartburn, melena, nausea and vomiting.  Genitourinary:  Negative for dysuria, frequency and urgency.  Musculoskeletal:  Positive for back pain and joint pain.  Skin: Negative.  Negative for itching and rash.  Neurological:  Negative for dizziness, tingling, focal weakness, weakness and headaches.  Endo/Heme/Allergies:  Does not bruise/bleed easily.  Psychiatric/Behavioral:  Negative for depression. The patient is not nervous/anxious and does not have insomnia.      MEDICAL HISTORY:  Past Medical History:  Diagnosis Date   Alternating RBBB & LBBB    Ankle swelling 2024   Arthritis    neck   Bladder tumor    CAD (coronary artery disease)    a. 08/2017 STEMI/Cath: LM 80ost/d, LAD 100p, LCX 95 ost/p, RCA 60p, 14m, RPDA 80ost; b. 08/2017 CABG x 3: LIMA->LAD, VG->OM, VG->PDA.   Cancer [facility])    bladder tumor   Cancer of base of tongue (HCC)    Essential hypertension    HFrEF (heart failure with reduced ejection fraction) (HCC)    a. 08/2017 TEE: EF 30-35%, sept/ant/inf HK, apical AK. 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 12/2017 Echo: EF 45-50%, diff HK. Gr1 DD. Mildly reduced RV fxn. Mild RAE.   Hyperlipidemia LDL goal <70    Ischemic cardiomyopathy    a. 08/2017 TEE: EF 30-35%; b. 12/2017 Echo: EF 45-50%.   Myocardial infarction (HCC) 08/27/2017   Shingles  2018 - right side   Vertigo    several months ago    SURGICAL HISTORY: Past Surgical History:  Procedure Laterality Date   CARDIAC CATHETERIZATION     CATARACT EXTRACTION W/PHACO Left 07/18/2018   Procedure: CATARACT EXTRACTION PHACO AND INTRAOCULAR LENS PLACEMENT (IOC)  LEFT;  Surgeon: Nevada Crane, MD;  Location: [facility];  Service: Ophthalmology;  Laterality:  Left;   CATARACT EXTRACTION W/PHACO Right 08/14/2018   Procedure: CATARACT EXTRACTION PHACO AND INTRAOCULAR LENS PLACEMENT (IOC)  RIGHT;  Surgeon: Nevada Crane, MD;  Location: [facility];  Service: Ophthalmology;  Laterality: Right;   CORONARY ARTERY BYPASS GRAFT N/A 08/27/2017   Procedure: CORONARY ARTERY BYPASS GRAFTING (CABG) ON PUMP USING LEFT INTERNAL MAMMARY ARTERY AND LEFT GREATER SAPHENOUS VEIN VIA ENDOVEIN HARVEST;  Surgeon: Alleen Borne, MD;  Location: [facility] OR;  Service: Open Heart Surgery;  Laterality: N/A;   CORONARY/GRAFT ACUTE MI REVASCULARIZATION N/A 08/27/2017   Procedure: Coronary/Graft Acute MI Revascularization;  Surgeon: Iran Ouch, MD;  Location: [facility] INVASIVE CV LAB;  Service: Cardiovascular;  Laterality: N/A;   CYSTOSCOPY W/ RETROGRADES Bilateral 12/25/2018   Procedure: CYSTOSCOPY WITH RETROGRADE PYELOGRAM;  Surgeon: Vanna Scotland, MD;  Location: [facility] ORS;  Service: Urology;  Laterality: Bilateral;   FRACTURE SURGERY Right 1958   arm and left wrist compound fracture, no metal   HERNIA REPAIR Right    inguinial   IR IMAGING GUIDED PORT INSERTION  10/15/2022   LEFT HEART CATH AND CORONARY ANGIOGRAPHY N/A 08/27/2017   Procedure: LEFT HEART CATH AND CORONARY ANGIOGRAPHY;  Surgeon: Iran Ouch, MD;  Location: [facility] INVASIVE CV LAB;  Service: Cardiovascular;  Laterality: N/A;   TRANSURETHRAL RESECTION OF BLADDER TUMOR WITH MITOMYCIN-C N/A 12/25/2018   Procedure: TRANSURETHRAL RESECTION OF BLADDER TUMOR WITH Gemcitabine;  Surgeon: Vanna Scotland, MD;  Location: [facility] ORS;  Service: Urology;  Laterality: N/A;    SOCIAL HISTORY: Social History   Socioeconomic History   Marital status: Single    Spouse name: Not on file   Number of children: Not on file   Years of education: Not on file   Highest education level: Not on file  Occupational History   Occupation: supervision, Research scientist (life sciences)    Comment: retired  Tobacco Use   Smoking status:  Former    Current packs/day: 0.00    Types: Cigarettes    Quit date: 2000    Years since quitting: 24.6   Smokeless tobacco: Current    Types: Snuff  Vaping Use   Vaping status: Never Used  Substance and Sexual Activity   Alcohol use: Not Currently   Drug use: Not Currently   Sexual activity: Not on file  Other Topics Concern   Not on file  Social History Narrative   Lives alone   Social Determinants of Health   Financial Resource Strain: Low Risk  (11/06/2018)   Overall Financial Resource Strain (CARDIA)    Difficulty of Paying Living Expenses: Not hard at all  Food Insecurity: No Food Insecurity (09/10/2022)   Hunger Vital Sign    Worried About Running Out of Food in the Last Year: Never true    Ran Out of Food in the Last Year: Never true  Transportation Needs: Not on file (09/07/2022)  Physical Activity: Inactive (11/06/2018)   Exercise Vital Sign    Days of Exercise per Week: 0 days    Minutes of Exercise per Session: 0 min  Stress: No Stress Concern Present (11/06/2018)  [employer] of Occupational Health - Occupational Stress Questionnaire    Feeling of Stress : Not at all  Social Connections: Socially Isolated (11/06/2018)   Social Connection and Isolation Panel [NHANES]    Frequency of Communication with Friends and Family: Once a week    Frequency of Social Gatherings with Friends and Family: Once a week    Attends Religious Services: Never    Database administrator or Organizations: No    Attends Banker Meetings: Never    Marital Status: Never married  Intimate Partner Violence: Not At Risk (09/10/2022)   Humiliation, Afraid, Rape, and Kick questionnaire    Fear of Current or Ex-Partner: No    Emotionally Abused: No    Physically Abused: No    Sexually Abused: No    FAMILY HISTORY: Family History  Problem Relation Age of Onset   Heart failure Mother    Lung disease Father    Stroke Father    Cervical cancer Maternal Grandmother      ALLERGIES:  has No Known Allergies.  MEDICATIONS:  Current Outpatient Medications  Medication Sig Dispense Refill   acetaminophen (TYLENOL) 650 MG CR tablet Take 650 mg by mouth every 8 (eight) hours as needed for pain.     aspirin EC 81 MG EC tablet Take 1 tablet (81 mg total) by mouth daily.     atorvastatin (LIPITOR) 80 MG tablet Take 1 tablet by mouth once daily 90 tablet 0   carvedilol (COREG) 12.5 MG tablet Take 1 tablet (12.5 mg total) by mouth 2 (two) times daily with a meal. PLEASE CALL OFFICE TO SCHEDULE APPOINTMENT PRIOR TO NEXT REFILL 180 tablet 0   lidocaine-prilocaine (EMLA) cream Apply on the port. 30 -45 min  prior to port access. 30 g 3   losartan (COZAAR) 100 MG tablet Take 1 tablet (100 mg total) by mouth daily. PLEASE CALL OFFICE TO SCHEDULE APPOINTMENT PRIOR TO NEXT REFILL 90 tablet 0   Multiple Vitamin (MULTIVITAMIN WITH MINERALS) TABS tablet Take 1 tablet by mouth daily. One a Day over 50/ lunch     nitroGLYCERIN (NITROSTAT) 0.4 MG SL tablet DISSOLVE ONE TABLET UNDER THE TONGUE EVERY 5 MINUTES AS NEEDED FOR CHEST PAIN.  DO NOT EXCEED A TOTAL OF 3 DOSES IN 15 MINUTES 25 tablet 0   ondansetron (ZOFRAN) 8 MG tablet One pill every 8 hours as needed for nausea/vomitting. 40 tablet 1   potassium chloride (KLOR-CON) 10 MEQ tablet Take 1 tablet (10 mEq total) by mouth daily. 90 tablet 3   prochlorperazine (COMPAZINE) 10 MG tablet Take 1 tablet (10 mg total) by mouth every 6 (six) hours as needed for nausea or vomiting. 40 tablet 1   torsemide (DEMADEX) 20 MG tablet Take 1 tablet (20 mg total) by mouth 2 (two) times daily. 180 tablet 3   traMADol (ULTRAM) 50 MG tablet Take 1 tablet (50 mg total) by mouth every 8 (eight) hours as needed. 30 tablet 0   No current facility-administered medications for this visit.     PHYSICAL EXAMINATION:   Vitals:   10/18/22 1037  BP: (!) 156/73  Pulse: 67  Temp: 98.8 F (37.1 C)  SpO2: 98%    Filed Weights   10/18/22 1037   Weight: 245 lb 6.4 oz (111.3 kg)   Left neck 2 cm submandibular LN present.   Physical Exam Vitals and nursing note reviewed.  HENT:     Head: Normocephalic and atraumatic.     Mouth/Throat:  Pharynx: Oropharynx is clear.  Eyes:     Extraocular Movements: Extraocular movements intact.     Pupils: Pupils are equal, round, and reactive to light.  Cardiovascular:     Rate and Rhythm: Normal rate and regular rhythm.  Pulmonary:     Comments: Decreased breath sounds bilaterally.  Abdominal:     Palpations: Abdomen is soft.  Musculoskeletal:        General: Normal range of motion.     Cervical back: Normal range of motion.  Skin:    General: Skin is warm.  Neurological:     General: No focal deficit present.     Mental Status: He is alert and oriented to person, place, and time.  Psychiatric:        Behavior: Behavior normal.        Judgment: Judgment normal.      LABORATORY DATA:  I have reviewed the data as listed Lab Results  Component Value Date   WBC 7.4 10/18/2022   HGB 12.6 (L) 10/18/2022   HCT 37.8 (L) 10/18/2022   MCV 92.9 10/18/2022   PLT 169 10/18/2022   Recent Labs    08/25/22 0947 10/05/22 1629 10/18/22 0947  NA 139 141 137  K 4.1 4.3 4.2  CL 111 105 109  CO2 23 25 23   GLUCOSE 110* 101* 122*  BUN 19 11 15   CREATININE 1.41* 1.16 0.97  CALCIUM 8.9 8.5* 8.5*  GFRNONAA 52*  --  >60  PROT 7.1  --   --   ALBUMIN 4.0  --   --   AST 26  --   --   ALT 19  --   --   ALKPHOS 53  --   --   BILITOT 0.9  --   --     RADIOGRAPHIC STUDIES: I have personally reviewed the radiological images as listed and agreed with the findings in the report. IR IMAGING GUIDED PORT INSERTION  Result Date: 10/15/2022 INDICATION: Chemotherapy EXAM: Chest port placement using ultrasound and fluoroscopic guidance MEDICATIONS: Documented in the EMR ANESTHESIA/SEDATION: Moderate (conscious) sedation was employed during this procedure. A total of Versed 1.5 mg and Fentanyl  100 mcg was administered intravenously. Moderate Sedation Time: 25 minutes. The patient's level of consciousness and vital signs were monitored continuously by radiology nursing throughout the procedure under my direct supervision. FLUOROSCOPY TIME:  Fluoroscopy Time: 0.5 minutes (6 mGy) COMPLICATIONS: None immediate. PROCEDURE: Informed written consent was obtained from the patient after a thorough discussion of the procedural risks, benefits and alternatives. All questions were addressed. Maximal Sterile Barrier Technique was utilized including caps, mask, sterile gowns, sterile gloves, sterile drape, hand hygiene and skin antiseptic. A timeout was performed prior to the initiation of the procedure. The patient was placed supine on the exam table. The right neck and chest was prepped and draped in the standard sterile fashion. A preliminary ultrasound of the right neck was performed and demonstrates a patent right internal jugular vein. A permanent ultrasound image was stored in the electronic medical record. The overlying skin was anesthetized with 1% Lidocaine. Using ultrasound guidance, access was obtained into the right internal jugular vein using a 21 gauge micropuncture set. A wire was advanced into the SVC, a short incision was made at the puncture site, and serial dilatation performed. Next, in an ipsilateral infraclavicular location, an incision was made at the site of the subcutaneous reservoir. Blunt dissection was used to open a pocket to contain the reservoir. A subcutaneous tunnel was  then created from the port site to the puncture site. A(n) 8 Fr single lumen catheter was advanced through the tunnel. The catheter was attached to the port and this was placed in the subcutaneous pocket. Under fluoroscopic guidance, a peel away sheath was placed, and the catheter was trimmed to the appropriate length and was advanced into the central veins. The catheter length is 23 cm. The tip of the catheter lies  near the superior cavoatrial junction. The port flushes and aspirates appropriately. The port was flushed and locked with heparinized saline. The port pocket was closed in 2 layers using 3-0 and 4-0 Vicryl/absorbable suture. Dermabond was also applied to both incisions. The patient tolerated the procedure well and was transferred to recovery in stable condition. IMPRESSION: Successful right-sided chest port placement via the right internal jugular vein. The port is ready for immediate use. Electronically Signed   By: Olive Bass M.D.   On: 10/15/2022 12:19   NM PET Image Initial (PI) Skull Base To Thigh (F-18 FDG)  Result Date: 09/28/2022 CLINICAL DATA:  Initial treatment strategy for oropharyngeal carcinoma. Cervical lymph node metastasis. EXAM: NUCLEAR MEDICINE PET SKULL BASE TO THIGH TECHNIQUE: 13.1 mCi F-18 FDG was injected intravenously. Full-ring PET imaging was performed from the skull base to thigh after the radiotracer. CT data was obtained and used for attenuation correction and anatomic localization. Fasting blood glucose: 93 mg/dl COMPARISON:  Neck CT of 08/25/2022. Abdominopelvic CT of 11/06/2018. FINDINGS: Mediastinal blood pool activity: SUV max 2.4 Liver activity: SUV max NA NECK: The left posterior tongue primary is hypermetabolic. Example at 3.8 x 3.8 cm and a S.U.V. max of 21.1 on 28/4. Adjacent left level 2 nodal mass measures 2.2 x 2.7 cm and a S.U.V. max of 10.7 on 28/2. A right level 2 node measures 5 mm and a S.U.V. max of 3.3 on 29/4. Incidental CT findings: Deferred to recent diagnostic CT. Bilateral carotid atherosclerosis. Left maxillary sinus mucous retention cyst or polyp. CHEST: No pulmonary parenchymal or thoracic nodal hypermetabolism. Incidental CT findings: Median sternotomy for CABG. Cardiomegaly. Aortic atherosclerosis. ABDOMEN/PELVIS: No abdominopelvic nodal hypermetabolism. Small focus of hypermetabolism within the anterior left side of the prostate measures a S.U.V. max  of 5.7 including on approximately image 163/4. Incidental CT findings: Nonspecific caudate lobe enlargement. 1.6 cm gallstone. Normal adrenal glands. Punctate left renal collecting system calculus. Right larger than left low-density renal lesions are likely cysts including up to 7.6 cm . In the absence of clinically indicated signs/symptoms require(s) no independent follow-up. Dense abdominal aortic atherosclerosis. Mild prostatomegaly. Prior right inguinal hernia repair. Fat containing left inguinal hernia. SKELETON: No abnormal marrow activity. Incidental CT findings: None. IMPRESSION: 1. Left tongue base primary with ipsilateral level 2 nodal metastasis. A right-sided diminutive level 2 node is mildly hypermetabolic and suspicious for contralateral nodal metastasis. 2. No extracervical metastatic disease identified. 3. Hypermetabolic focus within the prostate in the setting of prostatomegaly. Nonspecific. Consider correlation with PSA level. 4. Incidental findings, including: Cholelithiasis. Left nephrolithiasis. Aortic Atherosclerosis (ICD10-I70.0). Electronically Signed   By: Jeronimo Greaves M.D.   On: 09/28/2022 08:59   ECHOCARDIOGRAM COMPLETE  Result Date: 09/27/2022    ECHOCARDIOGRAM REPORT   Patient Name:   PTOLEMY SAVANNAH Date of Exam: 09/27/2022 Medical Rec #:  [id number]     Height:       72.0 in Accession #:    [id number]    Weight:       246.0 lb Date of Birth:  September 01, 1945  BSA:          2.327 m Patient Age:    77 years      BP:           129/64 mmHg Patient Gender: M             HR:           81 bpm. Exam Location:  [facility] Procedure: 2D Echo, Cardiac Doppler and Color Doppler Indications:     CAD  History:         Patient has prior history of Echocardiogram examinations, most                  recent 01/03/2018. CAD and Previous Myocardial Infarction;                  Prior CABG. Known PFO.  Sonographer:     Mikki Harbor Referring Phys:  [id number] Earna Coder Diagnosing Phys: Yvonne Kendall MD   Sonographer Comments: Technically difficult study due to poor echo windows. IMPRESSIONS  1. Left ventricular ejection fraction, by estimation, is 55 to 60%. The left ventricle has normal function. Left ventricular endocardial border not optimally defined to evaluate regional wall motion. There is moderate left ventricular hypertrophy. Left ventricular diastolic parameters are consistent with Grade I diastolic dysfunction (impaired relaxation).  2. Right ventricular systolic function is normal. The right ventricular size is normal. Tricuspid regurgitation signal is inadequate for assessing PA pressure.  3. Right atrial size was mildly dilated.  4. The mitral valve is grossly normal. Mild mitral valve regurgitation.  5. The aortic valve is tricuspid. Aortic valve regurgitation is trivial. No aortic stenosis is present. FINDINGS  Left Ventricle: Left ventricular ejection fraction, by estimation, is 55 to 60%. The left ventricle has normal function. Left ventricular endocardial border not optimally defined to evaluate regional wall motion. The left ventricular internal cavity size was normal in size. There is moderate left ventricular hypertrophy. Left ventricular diastolic parameters are consistent with Grade I diastolic dysfunction (impaired relaxation). Right Ventricle: The right ventricular size is normal. No increase in right ventricular wall thickness. Right ventricular systolic function is normal. Tricuspid regurgitation signal is inadequate for assessing PA pressure. Left Atrium: Left atrial size was normal in size. Right Atrium: Right atrial size was mildly dilated. Pericardium: The pericardium was not well visualized. Mitral Valve: The mitral valve is grossly normal. Mild mitral valve regurgitation. MV peak gradient, 5.6 mmHg. The mean mitral valve gradient is 3.0 mmHg. Tricuspid Valve: The tricuspid valve is normal in structure. Tricuspid valve regurgitation is trivial. Aortic Valve: The aortic valve is  tricuspid. Aortic valve regurgitation is trivial. No aortic stenosis is present. Aortic valve mean gradient measures 4.0 mmHg. Aortic valve peak gradient measures 8.1 mmHg. Aortic valve area, by VTI measures 2.78 cm. Pulmonic Valve: The pulmonic valve was normal in structure. Pulmonic valve regurgitation is mild. No evidence of pulmonic stenosis. Aorta: The aortic root and ascending aorta are structurally normal, with no evidence of dilitation. IAS/Shunts: The interatrial septum was not well visualized.  LEFT VENTRICLE PLAX 2D LVIDd:         5.10 cm      Diastology LVIDs:         3.40 cm      LV e' lateral:   10.10 cm/s LV PW:         1.60 cm      LV E/e' lateral: 8.5 LV IVS:        1.50  cm LVOT diam:     2.00 cm LV SV:         82 LV SV Index:   35 LVOT Area:     3.14 cm  LV Volumes (MOD) LV vol d, MOD A2C: 60.1 ml LV vol d, MOD A4C: 111.0 ml LV vol s, MOD A2C: 25.4 ml LV vol s, MOD A4C: 55.0 ml LV SV MOD A2C:     34.7 ml LV SV MOD A4C:     111.0 ml LV SV MOD BP:      51.1 ml RIGHT VENTRICLE RV Basal diam:  4.25 cm RV Mid diam:    3.60 cm RV S prime:     11.60 cm/s TAPSE (M-mode): 2.3 cm LEFT ATRIUM             Index        RIGHT ATRIUM           Index LA diam:        4.30 cm 1.85 cm/m   RA Area:     21.60 cm LA Vol (A2C):   54.0 ml 23.21 ml/m  RA Volume:   72.80 ml  31.29 ml/m LA Vol (A4C):   65.0 ml 27.94 ml/m LA Biplane Vol: 60.4 ml 25.96 ml/m  AORTIC VALVE                    PULMONIC VALVE AV Area (Vmax):    2.65 cm     PV Vmax:       1.55 m/s AV Area (Vmean):   2.78 cm     PV Peak grad:  9.6 mmHg AV Area (VTI):     2.78 cm AV Vmax:           142.00 cm/s AV Vmean:          94.400 cm/s AV VTI:            0.295 m AV Peak Grad:      8.1 mmHg AV Mean Grad:      4.0 mmHg LVOT Vmax:         120.00 cm/s LVOT Vmean:        83.500 cm/s LVOT VTI:          0.261 m LVOT/AV VTI ratio: 0.88  AORTA Ao Root diam: 3.50 cm Ao Asc diam:  3.30 cm MITRAL VALVE MV Area (PHT): 3.97 cm     SHUNTS MV Area VTI:   2.50 cm      Systemic VTI:  0.26 m MV Peak grad:  5.6 mmHg     Systemic Diam: 2.00 cm MV Mean grad:  3.0 mmHg MV Vmax:       1.18 m/s MV Vmean:      79.2 cm/s MV Decel Time: 191 msec MV E velocity: 85.40 cm/s MV A velocity: 111.00 cm/s MV E/A ratio:  0.77 Cory Hess End MD Electronically signed by Yvonne Kendall MD Signature Date/Time: 09/27/2022/5:45:34 PM    Final    [place] CORE BIOPSY (LYMPH NODES)  Result Date: 09/22/2022 INDICATION: Lymphadenopathy, concern for base of tongue squamous cell carcinoma EXAM: Ultrasound-guided core needle biopsy of left cervical lymph node MEDICATIONS: None. ANESTHESIA/SEDATION: Local analgesia COMPLICATIONS: None immediate. PROCEDURE: Informed written consent was obtained from the patient after a thorough discussion of the procedural risks, benefits and alternatives. All questions were addressed. Maximal Sterile Barrier Technique was utilized including caps, mask, sterile gowns, sterile gloves, sterile drape, hand hygiene and skin antiseptic. A timeout was performed prior  to the initiation of the procedure. The patient was placed supine on the exam table. Ultrasound of the left neck demonstrated enlarged hypoechoic left cervical lymph node. Skin entry site was marked, and the overlying skin was prepped draped in the standard sterile fashion. Local analgesia was obtained with 1% lidocaine. Using ultrasound guidance, core needle biopsy was performed of the enlarged left cervical lymph node using an 18 gauge core biopsy device x6 total passes. Specimens were submitted in saline to pathology for further handling. Limited postprocedure imaging demonstrated no hematoma. A clean dressing was placed after manual hemostasis. The patient tolerated the procedure well without immediate complication. IMPRESSION: Successful ultrasound-guided core needle biopsy of enlarged left cervical lymph node. Electronically Signed   By: Olive Bass M.D.   On: 09/22/2022 14:48    ASSESSMENT & PLAN:   Cancer of  base of tongue (HCC) # JULY-AUG 2024-Clinically SCC of left base of tongue-with left neck lymphadenopathy.  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 JULY 31st, 2024-  Left tongue base primary with ipsilateral level 2 nodal metastasis. A right-sided diminutive level 2 node is mildly hypermetabolic and suspicious for contralateral nodal metastasis.   No extracervical metastatic disease identified. Plan cisplatin weekly x6-8; with concurrent RT [last oct 9th, 2024]  # Proceed with cisplatin- weekly cisplatin #1/ of planned 6-8 cycles. Discussed that would recommend reimaging with a PET scan 8 to 12 weeks post chemoradiation.  #  I reviewed at length the individual components with chemotherapy; and the schedule in detail.  I also discussed the potential side effects including but not limited to-increasing fatigue, nausea vomiting, diarrhea, hair loss, sores in the mouth, increase risk of infection and also neuropathy.  Also reviewed the multiple strategies to avoid/mitigate similar side effects including preemptive medications-for nausea vomiting.  Also discussed at length regarding good oral hygiene/hydration and in general precautions regarding duration of infections.   # Pain from incisional /Bx- recommend Tramadol prn/scipt sent.   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 Stable.   # Gait instability- sec to BPPV- walks with cane/ monitor for neuropathy-   # Smoking/ Hx of smoking- quit > 25 years ago.   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 Discussed regarding low calcium. Recommend calcium 1200 plus vitamin D-3 1000-over-the-counter-1 pill a day.   Will also check vitamin D levels.   # Baseline weight:[240-250- July-AUG, 2024] I discussed at length the risk of weight loss / difficulty swallowing-counseled the patient regarding potential weight  loss; moderate to severe mucositis from chemoradiation.  Discussed that if significant weight loss noted recommend PEG tube placement.  Awaiting evaluation with  [facility].  # Hypermetabolic focus within the prostate in the setting of prostatomegaly. Correlation with PSA level.  # IV access: s/p port placement-- functional-   # Social support: poor social support- counseled   # DISPOSITION: As per IS-  Chemo tomorrow-  # follow up in 1 week-MD-; labs- cbc/cmp;mag; vit D 25-OH; phos; D-2 Cisplatin # follow up in 2 weeksMD-; labs- cbc/cmp;mag; phos; D-2 Cisplatin-Dr.B  # 40 minutes face-to-face with the patient discussing the above plan of care; more than 50% of time spent on prognosis/ natural history; counseling and coordination.   All questions were answered. The patient knows to call the clinic with any problems, questions or concerns.    Earna Coder, MD 10/18/2022 12:33 PM

## 2022-10-19 ENCOUNTER — Other Ambulatory Visit: Payer: Self-pay

## 2022-10-19 ENCOUNTER — Ambulatory Visit
Admission: RE | Admit: 2022-10-19 | Discharge: 2022-10-19 | Disposition: A | Discharge status: Home / Self Care | Payer: Medicare Other | Source: Ambulatory Visit | Attending: Radiation Oncology | Admitting: Radiation Oncology

## 2022-10-19 ENCOUNTER — Other Ambulatory Visit: Payer: Self-pay | Admitting: *Deleted

## 2022-10-19 ENCOUNTER — Inpatient Hospital Stay: Payer: Medicare Other

## 2022-10-19 VITALS — BP 141/68 | HR 68 | Temp 95.8°F | Resp 18

## 2022-10-19 DIAGNOSIS — C01 Malignant neoplasm of base of tongue: Secondary | ICD-10-CM | POA: Diagnosis not present

## 2022-10-19 DIAGNOSIS — C770 Secondary and unspecified malignant neoplasm of lymph nodes of head, face and neck: Secondary | ICD-10-CM | POA: Diagnosis not present

## 2022-10-19 DIAGNOSIS — Z510 Encounter for antineoplastic radiation therapy: Secondary | ICD-10-CM | POA: Diagnosis not present

## 2022-10-19 DIAGNOSIS — M542 Cervicalgia: Secondary | ICD-10-CM | POA: Diagnosis not present

## 2022-10-19 DIAGNOSIS — I6523 Occlusion and stenosis of bilateral carotid arteries: Secondary | ICD-10-CM | POA: Diagnosis not present

## 2022-10-19 DIAGNOSIS — Z5111 Encounter for antineoplastic chemotherapy: Secondary | ICD-10-CM | POA: Diagnosis not present

## 2022-10-19 LAB — RAD ONC ARIA SESSION SUMMARY
Course Elapsed Days: 6
Plan Fractions Treated to Date: 5
Plan Prescribed Dose Per Fraction: 2 Gy
Plan Total Fractions Prescribed: 35
Plan Total Prescribed Dose: 70 Gy
Reference Point Dosage Given to Date: 10 Gy
Reference Point Session Dosage Given: 2 Gy
Session Number: 5

## 2022-10-19 MED ORDER — PALONOSETRON HCL INJECTION 0.25 MG/5ML
0.2500 mg | Freq: Once | INTRAVENOUS | Status: AC
  Administered 2022-10-19: 0.25 mg via INTRAVENOUS
  Filled 2022-10-19: qty 5

## 2022-10-19 MED ORDER — HEPARIN SOD (PORK) LOCK FLUSH 100 UNIT/ML IV SOLN
500.0000 [IU] | Freq: Once | INTRAVENOUS | Status: AC | PRN
  Administered 2022-10-19: 500 [IU]
  Filled 2022-10-19: qty 5

## 2022-10-19 MED ORDER — MAGNESIUM SULFATE 2 GM/50ML IV SOLN
2.0000 g | Freq: Once | INTRAVENOUS | Status: AC
  Administered 2022-10-19: 2 g via INTRAVENOUS
  Filled 2022-10-19: qty 50

## 2022-10-19 MED ORDER — SODIUM CHLORIDE 0.9 % IV SOLN
Freq: Once | INTRAVENOUS | Status: AC
  Filled 2022-10-19: qty 250

## 2022-10-19 MED ORDER — SODIUM CHLORIDE 0.9 % IV SOLN
10.0000 mg | Freq: Once | INTRAVENOUS | Status: AC
  Administered 2022-10-19: 10 mg via INTRAVENOUS
  Filled 2022-10-19: qty 10

## 2022-10-19 MED ORDER — POTASSIUM CHLORIDE IN NACL 20-0.9 MEQ/L-% IV SOLN
Freq: Once | INTRAVENOUS | Status: AC
  Filled 2022-10-19: qty 1000

## 2022-10-19 MED ORDER — SODIUM CHLORIDE 0.9 % IV SOLN
40.0000 mg/m2 | Freq: Once | INTRAVENOUS | Status: AC
  Administered 2022-10-19: 100 mg via INTRAVENOUS
  Filled 2022-10-19: qty 100

## 2022-10-19 MED ORDER — SODIUM CHLORIDE 0.9 % IV SOLN
150.0000 mg | Freq: Once | INTRAVENOUS | Status: AC
  Administered 2022-10-19: 150 mg via INTRAVENOUS
  Filled 2022-10-19: qty 150

## 2022-10-19 NOTE — Progress Notes (Signed)
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  Past medical history of CAD, HLD, HTN, heart failure, MI.  Patient receiving concurrent chemotherapy and radiation.    Met with patient during infusion.  Patient reports that food taste bland.  Reports that he is having some pain when swallowing especially pills.  Says that he usually eats an oatmeal creme pie for breakfast. Then has [employer] hamburger with 1 packet of mayo, 2 peppers and 1 salt packet and senior coffee.  For dinner he has Dinty Moore beef stew or chicken and dumplings or sandwich (pimento cheese, [place], chicken salad).  Likes honeybuns.  Lives alone.  Does not have a microwave. Prepares meals in oven.    Patient states that he does not want a feeding tube.   Medications: zofran, compazine, KCL, MVI   Labs: glucose 122, Phosphorus and Mag WNL   Anthropometrics:   Height: 72 inches Weight: 245 lb 6.4 oz on 8/26 250 lb 4/25 BMI: 33  2% weight loss in the last 4 months, concerning   Estimated Energy Needs  Kcals: 6962-9528 Protein: 122-138 g Fluid: 2442-2775 ml   NUTRITION DIAGNOSIS: Inadequate oral intake related to base of tongue cancer as evidenced by taste alterations, 2% weight loss in the last 4 months.    INTERVENTION:  Discussed soft, moist, high calorie, high protein foods.  Handout provided Samples of ensure complete provided along with coupons to patient Recommend SLP evaluation.  Message sent to MD and team Contact information provided    MONITORING, EVALUATION, GOAL: weight trends, intake   Next Visit: Wednesday, Sept 4 during infusion  Mariselda Badalamenti B. Freida Busman, RD, LDN Registered Dietitian [phone]

## 2022-10-19 NOTE — Patient Instructions (Signed)
[facility]  Discharge Instructions: Thank you for choosing [facility] to provide your oncology and hematology care.  If you have a lab appointment with the Cancer Center, please go directly to the Cancer Center and check in at the registration area.  Wear comfortable clothing and clothing appropriate for easy access to any Portacath or PICC line.   We strive to give you quality time with your provider. You may need to reschedule your appointment if you arrive late (15 or more minutes).  Arriving late affects you and other patients whose appointments are after yours.  Also, if you miss three or more appointments without notifying the office, you may be dismissed from the clinic at the provider's discretion.      For prescription refill requests, have your pharmacy contact our office and allow 72 hours for refills to be completed.    Today you received the following chemotherapy and/or immunotherapy agents CISPLATIN      To help prevent nausea and vomiting after your treatment, we encourage you to take your nausea medication as directed.  BELOW ARE SYMPTOMS THAT SHOULD BE REPORTED IMMEDIATELY: *FEVER GREATER THAN 100.4 F (38 C) OR HIGHER *CHILLS OR SWEATING *NAUSEA AND VOMITING THAT IS NOT CONTROLLED WITH YOUR NAUSEA MEDICATION *UNUSUAL SHORTNESS OF BREATH *UNUSUAL BRUISING OR BLEEDING *URINARY PROBLEMS (pain or burning when urinating, or frequent urination) *BOWEL PROBLEMS (unusual diarrhea, constipation, pain near the anus) TENDERNESS IN MOUTH AND THROAT WITH OR WITHOUT PRESENCE OF ULCERS (sore throat, sores in mouth, or a toothache) UNUSUAL RASH, SWELLING OR PAIN  UNUSUAL VAGINAL DISCHARGE OR ITCHING   Items with * indicate a potential emergency and should be followed up as soon as possible or go to the Emergency Department if any problems should occur.  Please show the CHEMOTHERAPY ALERT CARD or IMMUNOTHERAPY ALERT CARD at check-in to  the Emergency Department and triage nurse.  Should you have questions after your visit or need to cancel or reschedule your appointment, please contact [facility]  [phone] and follow the prompts.  Office hours are 8:00 a.m. to 4:30 p.m. Monday - Friday. Please note that voicemails left after 4:00 p.m. may not be returned until the following business day.  We are closed weekends and major holidays. You have access to a nurse at all times for urgent questions. Please call the main number to the clinic [phone] and follow the prompts.  For any non-urgent questions, you may also contact your provider using MyChart. We now offer e-Visits for anyone 34 and older to request care online for non-urgent symptoms. For details visit [email or web address].   Also download the MyChart app! Go to the app store, search "MyChart", open the app, select , and log in with your MyChart username and password.   Cisplatin Injection What is this medication? CISPLATIN (SIS pla tin) treats some types of cancer. It works by slowing down the growth of cancer cells. This medicine may be used for other purposes; ask your health care provider or pharmacist if you have questions. COMMON BRAND NAME(S): Platinol, Platinol -AQ What should I tell my care team before I take this medication? They need to know if you have any of these conditions: Eye disease, vision problems Hearing problems Kidney disease Low blood counts, such as low white cells, platelets, or red blood cells Tingling of the fingers or toes, or other nerve disorder An unusual or allergic reaction to cisplatin, carboplatin, oxaliplatin,  other medications, foods, dyes, or preservatives If you or your partner are pregnant or trying to get pregnant Breast-feeding How should I use this medication? This medication is injected into a vein. It is given by your care team in a hospital or clinic setting. Talk to  your care team about the use of this medication in children. Special care may be needed. Overdosage: If you think you have taken too much of this medicine contact a poison control center or emergency room at once. NOTE: This medicine is only for you. Do not share this medicine with others. What if I miss a dose? Keep appointments for follow-up doses. It is important not to miss your dose. Call your care team if you are unable to keep an appointment. What may interact with this medication? Do not take this medication with any of the following: Live virus vaccines This medication may also interact with the following: Certain antibiotics, such as amikacin, gentamicin, neomycin, polymyxin B, streptomycin, tobramycin, vancomycin Foscarnet This list may not describe all possible interactions. Give your health care provider a list of all the medicines, herbs, non-prescription drugs, or dietary supplements you use. Also tell them if you smoke, drink alcohol, or use illegal drugs. Some items may interact with your medicine. What should I watch for while using this medication? Your condition will be monitored carefully while you are receiving this medication. You may need blood work done while taking this medication. This medication may make you feel generally unwell. This is not uncommon, as chemotherapy can affect healthy cells as well as cancer cells. Report any side effects. Continue your course of treatment even though you feel ill unless your care team tells you to stop. This medication may increase your risk of getting an infection. Call your care team for advice if you get a fever, chills, sore throat, or other symptoms of a cold or flu. Do not treat yourself. Try to avoid being around people who are sick. Avoid taking medications that contain aspirin, acetaminophen, ibuprofen, naproxen, or ketoprofen unless instructed by your care team. These medications may hide a fever. This medication may increase  your risk to bruise or bleed. Call your care team if you notice any unusual bleeding. Be careful brushing or flossing your teeth or using a toothpick because you may get an infection or bleed more easily. If you have any dental work done, tell your dentist you are receiving this medication. Drink fluids as directed while you are taking this medication. This will help protect your kidneys. Call your care team if you get diarrhea. Do not treat yourself. Talk to your care team if you or your partner wish to become pregnant or think you might be pregnant. This medication can cause serious birth defects if taken during pregnancy and for 14 months after the last dose. A negative pregnancy test is required before starting this medication. A reliable form of contraception is recommended while taking this medication and for 14 months after the last dose. Talk to your care team about effective forms of contraception. Do not father a child while taking this medication and for 11 months after the last dose. Use a condom during sex during this time period. Do not breast-feed while taking this medication. This medication may cause infertility. Talk to your care team if you are concerned about your fertility. What side effects may I notice from receiving this medication? Side effects that you should report to your care team as soon as possible: Allergic reactions--skin  rash, itching, hives, swelling of the face, lips, tongue, or throat Eye pain, change in vision, vision loss Hearing loss, ringing in ears Infection--fever, chills, cough, sore throat, wounds that don't heal, pain or trouble when passing urine, general feeling of discomfort or being unwell Kidney injury--decrease in the amount of urine, swelling of the ankles, hands, or feet Low red blood cell level--unusual weakness or fatigue, dizziness, headache, trouble breathing Painful swelling, warmth, or redness of the skin, blisters or sores at the infusion  site Pain, tingling, or numbness in the hands or feet Unusual bruising or bleeding Side effects that usually do not require medical attention (report to your care team if they continue or are bothersome): Hair loss Nausea Vomiting This list may not describe all possible side effects. Call your doctor for medical advice about side effects. You may report side effects to FDA at 1-800-FDA-1088. Where should I keep my medication? This medication is given in a hospital or clinic. It will not be stored at home. NOTE: This sheet is a summary. It may not cover all possible information. If you have questions about this medicine, talk to your doctor, pharmacist, or health care provider.  2024 Elsevier/Gold Standard (2021-06-12 00:00:00)

## 2022-10-19 NOTE — Progress Notes (Signed)
Pt only voided 100 cc so far, states did void at home this am.  Dr Donneta Romberg aware- ok to proceed.

## 2022-10-20 ENCOUNTER — Other Ambulatory Visit: Payer: Self-pay | Admitting: *Deleted

## 2022-10-20 ENCOUNTER — Ambulatory Visit
Admission: RE | Admit: 2022-10-20 | Discharge: 2022-10-20 | Disposition: A | Discharge status: Home / Self Care | Payer: Medicare Other | Source: Ambulatory Visit | Attending: Radiation Oncology | Admitting: Radiation Oncology

## 2022-10-20 ENCOUNTER — Telehealth: Payer: Self-pay

## 2022-10-20 ENCOUNTER — Other Ambulatory Visit: Payer: Self-pay

## 2022-10-20 DIAGNOSIS — C770 Secondary and unspecified malignant neoplasm of lymph nodes of head, face and neck: Secondary | ICD-10-CM | POA: Diagnosis not present

## 2022-10-20 DIAGNOSIS — C01 Malignant neoplasm of base of tongue: Secondary | ICD-10-CM | POA: Diagnosis not present

## 2022-10-20 DIAGNOSIS — Z5111 Encounter for antineoplastic chemotherapy: Secondary | ICD-10-CM | POA: Diagnosis not present

## 2022-10-20 DIAGNOSIS — Z510 Encounter for antineoplastic radiation therapy: Secondary | ICD-10-CM | POA: Diagnosis not present

## 2022-10-20 DIAGNOSIS — I6523 Occlusion and stenosis of bilateral carotid arteries: Secondary | ICD-10-CM | POA: Diagnosis not present

## 2022-10-20 DIAGNOSIS — M542 Cervicalgia: Secondary | ICD-10-CM | POA: Diagnosis not present

## 2022-10-20 LAB — RAD ONC ARIA SESSION SUMMARY
Course Elapsed Days: 7
Plan Fractions Treated to Date: 6
Plan Prescribed Dose Per Fraction: 2 Gy
Plan Total Fractions Prescribed: 35
Plan Total Prescribed Dose: 70 Gy
Reference Point Dosage Given to Date: 12 Gy
Reference Point Session Dosage Given: 2 Gy
Session Number: 6

## 2022-10-20 NOTE — Telephone Encounter (Signed)
Telephone call to patient for follow up after receiving first infusion.   No answer but left message stating we were calling to check on them.  Encouraged patient to call for any questions or concerns.   

## 2022-10-21 ENCOUNTER — Other Ambulatory Visit: Payer: Self-pay

## 2022-10-21 ENCOUNTER — Ambulatory Visit
Admission: RE | Admit: 2022-10-21 | Discharge: 2022-10-21 | Disposition: A | Discharge status: Home / Self Care | Payer: Medicare Other | Source: Ambulatory Visit | Attending: Radiation Oncology | Admitting: Radiation Oncology

## 2022-10-21 DIAGNOSIS — Z5111 Encounter for antineoplastic chemotherapy: Secondary | ICD-10-CM | POA: Diagnosis not present

## 2022-10-21 DIAGNOSIS — C01 Malignant neoplasm of base of tongue: Secondary | ICD-10-CM | POA: Diagnosis not present

## 2022-10-21 DIAGNOSIS — M542 Cervicalgia: Secondary | ICD-10-CM | POA: Diagnosis not present

## 2022-10-21 DIAGNOSIS — I6523 Occlusion and stenosis of bilateral carotid arteries: Secondary | ICD-10-CM | POA: Diagnosis not present

## 2022-10-21 DIAGNOSIS — C770 Secondary and unspecified malignant neoplasm of lymph nodes of head, face and neck: Secondary | ICD-10-CM | POA: Diagnosis not present

## 2022-10-21 DIAGNOSIS — Z510 Encounter for antineoplastic radiation therapy: Secondary | ICD-10-CM | POA: Diagnosis not present

## 2022-10-21 LAB — RAD ONC ARIA SESSION SUMMARY
Course Elapsed Days: 8
Plan Fractions Treated to Date: 7
Plan Prescribed Dose Per Fraction: 2 Gy
Plan Total Fractions Prescribed: 35
Plan Total Prescribed Dose: 70 Gy
Reference Point Dosage Given to Date: 14 Gy
Reference Point Session Dosage Given: 2 Gy
Session Number: 7

## 2022-10-22 ENCOUNTER — Ambulatory Visit: Admission: RE | Admit: 2022-10-22 | Payer: Medicare Other | Source: Ambulatory Visit

## 2022-10-22 ENCOUNTER — Other Ambulatory Visit: Payer: Self-pay

## 2022-10-22 DIAGNOSIS — C770 Secondary and unspecified malignant neoplasm of lymph nodes of head, face and neck: Secondary | ICD-10-CM | POA: Diagnosis not present

## 2022-10-22 DIAGNOSIS — Z5111 Encounter for antineoplastic chemotherapy: Secondary | ICD-10-CM | POA: Diagnosis not present

## 2022-10-22 DIAGNOSIS — Z510 Encounter for antineoplastic radiation therapy: Secondary | ICD-10-CM | POA: Diagnosis not present

## 2022-10-22 DIAGNOSIS — M542 Cervicalgia: Secondary | ICD-10-CM | POA: Diagnosis not present

## 2022-10-22 DIAGNOSIS — C01 Malignant neoplasm of base of tongue: Secondary | ICD-10-CM | POA: Diagnosis not present

## 2022-10-22 DIAGNOSIS — I6523 Occlusion and stenosis of bilateral carotid arteries: Secondary | ICD-10-CM | POA: Diagnosis not present

## 2022-10-22 LAB — RAD ONC ARIA SESSION SUMMARY
Course Elapsed Days: 9
Plan Fractions Treated to Date: 8
Plan Prescribed Dose Per Fraction: 2 Gy
Plan Total Fractions Prescribed: 35
Plan Total Prescribed Dose: 70 Gy
Reference Point Dosage Given to Date: 16 Gy
Reference Point Session Dosage Given: 2 Gy
Session Number: 8

## 2022-10-25 ENCOUNTER — Other Ambulatory Visit: Payer: Self-pay | Admitting: Internal Medicine

## 2022-10-26 ENCOUNTER — Encounter: Payer: Self-pay | Admitting: Internal Medicine

## 2022-10-26 ENCOUNTER — Inpatient Hospital Stay (HOSPITAL_BASED_OUTPATIENT_CLINIC_OR_DEPARTMENT_OTHER): Payer: Medicare Other | Admitting: Internal Medicine

## 2022-10-26 ENCOUNTER — Ambulatory Visit
Admission: RE | Admit: 2022-10-26 | Discharge: 2022-10-26 | Disposition: A | Discharge status: Home / Self Care | Payer: Medicare Other | Source: Ambulatory Visit | Attending: Radiation Oncology | Admitting: Radiation Oncology

## 2022-10-26 ENCOUNTER — Other Ambulatory Visit: Payer: Self-pay

## 2022-10-26 ENCOUNTER — Telehealth: Payer: Self-pay

## 2022-10-26 ENCOUNTER — Inpatient Hospital Stay: Payer: Medicare Other | Attending: Internal Medicine

## 2022-10-26 ENCOUNTER — Other Ambulatory Visit: Payer: Self-pay | Admitting: *Deleted

## 2022-10-26 VITALS — BP 146/96 | HR 82 | Temp 98.1°F | Ht 72.0 in | Wt 238.6 lb

## 2022-10-26 DIAGNOSIS — K5900 Constipation, unspecified: Secondary | ICD-10-CM | POA: Insufficient documentation

## 2022-10-26 DIAGNOSIS — I110 Hypertensive heart disease with heart failure: Secondary | ICD-10-CM | POA: Diagnosis not present

## 2022-10-26 DIAGNOSIS — Z6833 Body mass index (BMI) 33.0-33.9, adult: Secondary | ICD-10-CM

## 2022-10-26 DIAGNOSIS — G4700 Insomnia, unspecified: Secondary | ICD-10-CM | POA: Diagnosis not present

## 2022-10-26 DIAGNOSIS — I5022 Chronic systolic (congestive) heart failure: Secondary | ICD-10-CM | POA: Insufficient documentation

## 2022-10-26 DIAGNOSIS — Z510 Encounter for antineoplastic radiation therapy: Secondary | ICD-10-CM | POA: Insufficient documentation

## 2022-10-26 DIAGNOSIS — Z5111 Encounter for antineoplastic chemotherapy: Secondary | ICD-10-CM | POA: Insufficient documentation

## 2022-10-26 DIAGNOSIS — I2510 Atherosclerotic heart disease of native coronary artery without angina pectoris: Secondary | ICD-10-CM | POA: Diagnosis not present

## 2022-10-26 DIAGNOSIS — Z7982 Long term (current) use of aspirin: Secondary | ICD-10-CM | POA: Insufficient documentation

## 2022-10-26 DIAGNOSIS — Z79899 Other long term (current) drug therapy: Secondary | ICD-10-CM | POA: Insufficient documentation

## 2022-10-26 DIAGNOSIS — C770 Secondary and unspecified malignant neoplasm of lymph nodes of head, face and neck: Secondary | ICD-10-CM | POA: Insufficient documentation

## 2022-10-26 DIAGNOSIS — Z87891 Personal history of nicotine dependence: Secondary | ICD-10-CM | POA: Diagnosis not present

## 2022-10-26 DIAGNOSIS — C01 Malignant neoplasm of base of tongue: Secondary | ICD-10-CM | POA: Insufficient documentation

## 2022-10-26 LAB — BASIC METABOLIC PANEL - CANCER CENTER ONLY
Anion gap: 6 (ref 5–15)
BUN: 17 mg/dL (ref 8–23)
CO2: 25 mmol/L (ref 22–32)
Calcium: 8.5 mg/dL — ABNORMAL LOW (ref 8.9–10.3)
Chloride: 103 mmol/L (ref 98–111)
Creatinine: 1.05 mg/dL (ref 0.61–1.24)
GFR, Estimated: >60 mL/min (ref >60)
Glucose, Bld: 128 mg/dL — ABNORMAL HIGH (ref 70–99)
Potassium: 3.6 mmol/L (ref 3.5–5.1)
Sodium: 134 mmol/L — ABNORMAL LOW (ref 135–145)

## 2022-10-26 LAB — CBC WITH DIFFERENTIAL (CANCER CENTER ONLY)
Abs Immature Granulocytes: 0.02 10*3/uL (ref 0.00–0.07)
Basophils Absolute: 0 10*3/uL (ref 0.0–0.1)
Basophils Relative: 1 %
Eosinophils Absolute: 0.2 10*3/uL (ref 0.0–0.5)
Eosinophils Relative: 2 %
HCT: 34.3 % — ABNORMAL LOW (ref 39.0–52.0)
Hemoglobin: 11.5 g/dL — ABNORMAL LOW (ref 13.0–17.0)
Immature Granulocytes: 0 %
Lymphocytes Relative: 12 %
Lymphs Abs: 0.8 10*3/uL (ref 0.7–4.0)
MCH: 31.2 pg (ref 26.0–34.0)
MCHC: 33.5 g/dL (ref 30.0–36.0)
MCV: 93 fL (ref 80.0–100.0)
Monocytes Absolute: 0.7 10*3/uL (ref 0.1–1.0)
Monocytes Relative: 11 %
Neutro Abs: 4.8 10*3/uL (ref 1.7–7.7)
Neutrophils Relative %: 74 %
Platelet Count: 176 10*3/uL (ref 150–400)
RBC: 3.69 MIL/uL — ABNORMAL LOW (ref 4.22–5.81)
RDW: 12.3 % (ref 11.5–15.5)
WBC Count: 6.6 10*3/uL (ref 4.0–10.5)
nRBC: 0 % (ref 0.0–0.2)

## 2022-10-26 LAB — RAD ONC ARIA SESSION SUMMARY
Course Elapsed Days: 13
Plan Fractions Treated to Date: 9
Plan Prescribed Dose Per Fraction: 2 Gy
Plan Total Fractions Prescribed: 35
Plan Total Prescribed Dose: 70 Gy
Reference Point Dosage Given to Date: 18 Gy
Reference Point Session Dosage Given: 2 Gy
Session Number: 9

## 2022-10-26 LAB — PHOSPHORUS: Phosphorus: 2.3 mg/dL — ABNORMAL LOW (ref 2.5–4.6)

## 2022-10-26 LAB — MAGNESIUM: Magnesium: 2.1 mg/dL (ref 1.7–2.4)

## 2022-10-26 LAB — VITAMIN D 25 HYDROXY (VIT D DEFICIENCY, FRACTURES): Vit D, 25-Hydroxy: 55.7 ng/mL (ref 30–100)

## 2022-10-26 MED ORDER — SUCRALFATE 1 G PO TABS: 1.0000 g | ORAL_TABLET | Freq: Three times a day (TID) | ORAL | 1 refills | Status: DC

## 2022-10-26 MED ORDER — TRAMADOL HCL 100 MG PO TABS: 100.0000 mg | ORAL_TABLET | Freq: Three times a day (TID) | ORAL | 0 refills | Status: DC | PRN

## 2022-10-26 NOTE — Progress Notes (Signed)
C/o no taste. No appetite. Has ensure prn.  Pain left ear/temple area, 6/10.  Taking vit d and calcium otc, unsure of how much.  Trouble sleeping, no sleep aid, had 1 hour last night.  Having some nausea, has rx.  Pt states he is not having regular bowel movements but not constipated.

## 2022-10-26 NOTE — Progress Notes (Signed)
[facility] CONSULT NOTE  Patient Care Team: Patient, No Pcp Per as PCP - General (General Practice) End, Cristal Deer, MD as PCP - Cardiology (Cardiology) Carmina Miller, MD as Consulting Physician (Radiation Oncology) Earna Coder, MD as Consulting Physician (Oncology)  CHIEF COMPLAINTS/PURPOSE OF CONSULTATION: head and neck cancer  #  Oncology History Overview Note  JULY, 2024- CT scan:  1. 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 2. Metastatic lymphadenopathy at the level 2 station on the left measuring 2.7 x 2.5 x 2.1 cm. 3. Atherosclerotic calcification of the carotid bifurcations.  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   Alone.   Cory Hess 77 y.o.  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  Complains of no taste. No appetite. Has ensure prn.    Pain left ear/temple area, 6/10. On tramadol- taking 2-3 a day.     Taking vit d and calcium otc, unsure of how much.   Trouble sleeping, no sleep aid, had 1 hour last night.    Pt states he is not having regular bowel movements but not constipated.   Review of Systems  Constitutional:  Positive for malaise/fatigue and weight loss. Negative for chills, diaphoresis and fever.  HENT:  Negative for nosebleeds and sore throat.    Eyes:  Negative for double vision.  Respiratory:  Negative for cough, hemoptysis, sputum production, shortness of breath and wheezing.   Cardiovascular:  Negative for chest pain, palpitations, orthopnea and leg swelling.  Gastrointestinal:  Positive for nausea. Negative for abdominal pain, blood in stool, constipation, diarrhea, heartburn, melena and vomiting.  Genitourinary:  Negative for dysuria, frequency and urgency.  Musculoskeletal:  Positive for back pain and joint pain.  Skin: Negative.  Negative for itching and rash.  Neurological:  Negative for dizziness, tingling, focal weakness, weakness and headaches.  Endo/Heme/Allergies:  Does not bruise/bleed easily.  Psychiatric/Behavioral:  Negative for depression. The patient is not nervous/anxious and does not have insomnia.      MEDICAL HISTORY:  Past Medical History:  Diagnosis Date   Alternating RBBB & LBBB    Ankle swelling 2024   Arthritis    neck   Bladder tumor    CAD (coronary artery disease)    a. 08/2017 STEMI/Cath: LM 80ost/d, LAD 100p, LCX 95 ost/p, RCA 60p, 38m, RPDA 80ost; b. 08/2017 CABG x 3: LIMA->LAD, VG->OM, VG->PDA.   Cancer [facility])    bladder tumor   Cancer of base of tongue (HCC)    Essential hypertension    HFrEF (heart failure with reduced ejection fraction) (HCC)    a. 08/2017 TEE: EF 30-35%, sept/ant/inf HK, apical AK. 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 12/2017 Echo: EF 45-50%, diff HK. Gr1 DD. Mildly reduced RV fxn. Mild RAE.   Hyperlipidemia LDL goal <70    Ischemic cardiomyopathy  a. 08/2017 TEE: EF 30-35%; b. 12/2017 Echo: EF 45-50%.   Myocardial infarction (HCC) 08/27/2017   Shingles    2018 - right side   Vertigo    several months ago    SURGICAL HISTORY: Past Surgical History:  Procedure Laterality Date   CARDIAC CATHETERIZATION     CATARACT EXTRACTION W/PHACO Left 07/18/2018   Procedure: CATARACT EXTRACTION PHACO AND INTRAOCULAR LENS PLACEMENT (IOC)  LEFT;  Surgeon: Nevada Crane, MD;   Location: [facility];  Service: Ophthalmology;  Laterality: Left;   CATARACT EXTRACTION W/PHACO Right 08/14/2018   Procedure: CATARACT EXTRACTION PHACO AND INTRAOCULAR LENS PLACEMENT (IOC)  RIGHT;  Surgeon: Nevada Crane, MD;  Location: [facility];  Service: Ophthalmology;  Laterality: Right;   CORONARY ARTERY BYPASS GRAFT N/A 08/27/2017   Procedure: CORONARY ARTERY BYPASS GRAFTING (CABG) ON PUMP USING LEFT INTERNAL MAMMARY ARTERY AND LEFT GREATER SAPHENOUS VEIN VIA ENDOVEIN HARVEST;  Surgeon: Alleen Borne, MD;  Location: [facility] OR;  Service: Open Heart Surgery;  Laterality: N/A;   CORONARY/GRAFT ACUTE MI REVASCULARIZATION N/A 08/27/2017   Procedure: Coronary/Graft Acute MI Revascularization;  Surgeon: Iran Ouch, MD;  Location: [facility] IN[facility];  Service: Cardiovascular;  Laterality: N/A;   CYSTOSCOPY W/ RETROGRADES Bilateral 12/25/2018   Procedure: CYSTOSCOPY WITH RETROGRADE PYELOGRAM;  Surgeon: Vanna Scotland, MD;  Location: [facility] ORS;  Service: Urology;  Laterality: Bilateral;   FRACTURE SURGERY Right 1958   arm and left wrist compound fracture, no metal   HERNIA REPAIR Right    inguinial   IR IMAGING GUIDED PORT INSERTION  10/15/2022   LEFT HEART CATH AND CORONARY ANGIOGRAPHY N/A 08/27/2017   Procedure: LEFT HEART CATH AND CORONARY ANGIOGRAPHY;  Surgeon: Iran Ouch, MD;  Location: [facility] INVAS[facility] CV LAB;  Service: Cardiovascular;  Laterality: N/A;   TRANSURETHRAL RESECTION OF BLADDER TUMOR WITH MITOMYCIN-C N/A 12/25/2018   Procedure: TRANSURETHRAL RESECTION OF BLADDER TUMOR WITH Gemcitabine;  Surgeon: Vanna Scotland, MD;  Location: [facility] OR[facility];  Service: Urology;  Laterality: N/A;    SOCIAL HISTORY: Social History   Socioeconomic History   Marital status: Single    Spouse name: Not on file   Number of children: Not on file   Years of education: Not on file   Highest education level: Not on file  Occupational History   Occupation: supervision, Haematologist    Comment: retired  Tobacco Use   Smoking status: Former    Current packs/day: 0.00    Types: Cigarettes    Quit date: 2000    Years since quitting: 24.6   Smokeless tobacco: Current    Types: Snuff  Vaping Use   Vaping status: Never Used  Substance and Sexual Activity   Alcohol use: Not Currently   Drug use: Not Currently   Sexual activity: Not on file  Other Topics Concern   Not on file  Social History Narrative   Lives alone   Social Determinants of Health   Financial Resource Strain: Low Risk  (11/06/2018)   Overall Financial Resource Strain (CARDIA)    Difficulty of Paying Living Expenses: Not hard at all  Food Insecurity: No Food Insecurity (09/10/2022)   Hunger Vital Sign    Worried About Running Out of Food in the Last Year: Never true    Ran Out of Food in the Last Year: Never true  Transportation Needs: Not on file (09/07/2022)  Physical Activity: Inactive (11/06/2018)   Exercise Vital Sign    Days of Exercise per  Week: 0 days    Minutes of Exercise per Session: 0 min  Stress: No Stress Concern Present (11/06/2018)   [employer] of Occupational Health - Occupational Stress Questionnaire    Feeling of Stress : Not at all  Social Connections: Socially Isolated (11/06/2018)   Social Connection and Isolation Panel [NHANES]    Frequency of Communication with Friends and Family: Once a week    Frequency of Social Gatherings with Friends and Family: Once a week    Attends Religious Services: Never    Database administrator or Organizations: No    Attends Banker Meetings: Never    Marital Status: Never married  Intimate Partner Violence: Not At Risk (09/10/2022)   Humiliation, Afraid, Rape, and Kick questionnaire    Fear of Current or Ex-Partner: No    Emotionally Abused: No    Physically Abused: No    Sexually Abused: No    FAMILY HISTORY: Family History  Problem Relation Age of Onset   Heart failure Mother    Lung disease Father     Stroke Father    Cervical cancer Maternal Grandmother     ALLERGIES:  has No Known Allergies.  MEDICATIONS:  Current Outpatient Medications  Medication Sig Dispense Refill   acetaminophen (TYLENOL) 650 MG CR tablet Take 650 mg by mouth every 8 (eight) hours as needed for pain.     aspirin EC 81 MG EC tablet Take 1 tablet (81 mg total) by mouth daily.     atorvastatin (LIPITOR) 80 MG tablet Take 1 tablet by mouth once daily 90 tablet 0   carvedilol (COREG) 12.5 MG tablet Take 1 tablet (12.5 mg total) by mouth 2 (two) times daily with a meal. PLEASE CALL OFFICE TO SCHEDULE APPOINTMENT PRIOR TO NEXT REFILL 180 tablet 0   lidocaine-prilocaine (EMLA) cream Apply on the port. 30 -45 min  prior to port access. 30 g 3   losartan (COZAAR) 100 MG tablet Take 1 tablet (100 mg total) by mouth daily. PLEASE CALL OFFICE TO SCHEDULE APPOINTMENT PRIOR TO NEXT REFILL 90 tablet 0   Multiple Vitamin (MULTIVITAMIN WITH MINERALS) TABS tablet Take 1 tablet by mouth daily. One a Day over 50/ lunch     nitroGLYCERIN (NITROSTAT) 0.4 MG SL tablet DISSOLVE ONE TABLET UNDER THE TONGUE EVERY 5 MINUTES AS NEEDED FOR CHEST PAIN.  DO NOT EXCEED A TOTAL OF 3 DOSES IN 15 MINUTES 25 tablet 0   ondansetron (ZOFRAN) 8 MG tablet One pill every 8 hours as needed for nausea/vomitting. 40 tablet 1   potassium chloride (KLOR-CON) 10 MEQ tablet Take 1 tablet (10 mEq total) by mouth daily. 90 tablet 3   prochlorperazine (COMPAZINE) 10 MG tablet Take 1 tablet (10 mg total) by mouth every 6 (six) hours as needed for nausea or vomiting. 40 tablet 1   torsemide (DEMADEX) 20 MG tablet Take 1 tablet (20 mg total) by mouth 2 (two) times daily. 180 tablet 3   traMADol HCl 100 MG TABS Take 100 mg by mouth every 8 (eight) hours as needed. 30 tablet 0   No current facility-administered medications for this visit.     PHYSICAL EXAMINATION:   Vitals:   10/26/22 0857  BP: (!) 146/96  Pulse: 82  Temp: 98.1 F (36.7 C)  SpO2: 100%     Filed Weights   10/26/22 0857  Weight: 238 lb 9.6 oz (108.2 kg)   Left neck 2 cm submandibular LN present.   Physical Exam Vitals and  nursing note reviewed.  HENT:     Head: Normocephalic and atraumatic.     Mouth/Throat:     Pharynx: Oropharynx is clear.  Eyes:     Extraocular Movements: Extraocular movements intact.     Pupils: Pupils are equal, round, and reactive to light.  Cardiovascular:     Rate and Rhythm: Normal rate and regular rhythm.  Pulmonary:     Comments: Decreased breath sounds bilaterally.  Abdominal:     Palpations: Abdomen is soft.  Musculoskeletal:        General: Normal range of motion.     Cervical back: Normal range of motion.  Skin:    General: Skin is warm.  Neurological:     General: No focal deficit present.     Mental Status: He is alert and oriented to person, place, and time.  Psychiatric:        Behavior: Behavior normal.        Judgment: Judgment normal.      LABORATORY DATA:  I have reviewed the data as listed Lab Results  Component Value Date   WBC 6.6 10/26/2022   HGB 11.5 (L) 10/26/2022   HCT 34.3 (L) 10/26/2022   MCV 93.0 10/26/2022   PLT 176 10/26/2022   Recent Labs    08/25/22 0947 10/05/22 1629 10/18/22 0947 10/26/22 0906  NA 139 141 137 134*  K 4.1 4.3 4.2 3.6  CL 111 105 109 103  CO2 23 25 23 25   GLUCOSE 110* 101* 122* 128*  BUN 19 11 15 17   CREATININE 1.41* 1.16 0.97 1.05  CALCIUM 8.9 8.5* 8.5* 8.5*  GFRNONAA 52*  --  >60 >60  PROT 7.1  --   --   --   ALBUMIN 4.0  --   --   --   AST 26  --   --   --   ALT 19  --   --   --   ALKPHOS 53  --   --   --   BILITOT 0.9  --   --   --     RADIOGRAPHIC STUDIES: I have personally reviewed the radiological images as listed and agreed with the findings in the report. IR IMAGING GUIDED PORT INSERTION  Result Date: 10/15/2022 INDICATION: Chemotherapy EXAM: Chest port placement using ultrasound and fluoroscopic guidance MEDICATIONS: Documented in the EMR  ANESTHESIA/SEDATION: Moderate (conscious) sedation was employed during this procedure. A total of Versed 1.5 mg and Fentanyl 100 mcg was administered intravenously. Moderate Sedation Time: 25 minutes. The patient's level of consciousness and vital signs were monitored continuously by radiology nursing throughout the procedure under my direct supervision. FLUOROSCOPY TIME:  Fluoroscopy Time: 0.5 minutes (6 mGy) COMPLICATIONS: None immediate. PROCEDURE: Informed written consent was obtained from the patient after a thorough discussion of the procedural risks, benefits and alternatives. All questions were addressed. Maximal Sterile Barrier Technique was utilized including caps, mask, sterile gowns, sterile gloves, sterile drape, hand hygiene and skin antiseptic. A timeout was performed prior to the initiation of the procedure. The patient was placed supine on the exam table. The right neck and chest was prepped and draped in the standard sterile fashion. A preliminary ultrasound of the right neck was performed and demonstrates a patent right internal jugular vein. A permanent ultrasound image was stored in the electronic medical record. The overlying skin was anesthetized with 1% Lidocaine. Using ultrasound guidance, access was obtained into the right internal jugular vein using a 21 gauge micropuncture set. A wire  was advanced into the SVC, a short incision was made at the puncture site, and serial dilatation performed. Next, in an ipsilateral infraclavicular location, an incision was made at the site of the subcutaneous reservoir. Blunt dissection was used to open a pocket to contain the reservoir. A subcutaneous tunnel was then created from the port site to the puncture site. A(n) 8 Fr single lumen catheter was advanced through the tunnel. The catheter was attached to the port and this was placed in the subcutaneous pocket. Under fluoroscopic guidance, a peel away sheath was placed, and the catheter was trimmed to  the appropriate length and was advanced into the central veins. The catheter length is 23 cm. The tip of the catheter lies near the superior cavoatrial junction. The port flushes and aspirates appropriately. The port was flushed and locked with heparinized saline. The port pocket was closed in 2 layers using 3-0 and 4-0 Vicryl/absorbable suture. Dermabond was also applied to both incisions. The patient tolerated the procedure well and was transferred to recovery in stable condition. IMPRESSION: Successful right-sided chest port placement via the right internal jugular vein. The port is ready for immediate use. Electronically Signed   By: Olive Bass M.D.   On: 10/15/2022 12:19   ECHOCARDIOGRAM COMPLETE  Result Date: 09/27/2022    ECHOCARDIOGRAM REPORT   Patient Name:   ELICK RECLA Date of Exam: 09/27/2022 Medical Rec #:  [id number]     Height:       72.0 in Accession #:    [id number]    Weight:       246.0 lb Date of Birth:  September 08, 1945     BSA:          2.327 m Patient Age:    77 years      BP:           129/64 mmHg Patient Gender: M             HR:           81 bpm. Exam Location:  [facility] Procedure: 2D Echo, Cardiac Doppler and Color Doppler Indications:     CAD  History:         Patient has prior history of Echocardiogram examinations, most                  recent 01/03/2018. CAD and Previous Myocardial Infarction;                  Prior CABG. Known PFO.  Sonographer:     Mikki Harbor Referring Phys:  [id number] Earna Coder Diagnosing Phys: Yvonne Kendall MD  Sonographer Comments: Technically difficult study due to poor echo windows. IMPRESSIONS  1. Left ventricular ejection fraction, by estimation, is 55 to 60%. The left ventricle has normal function. Left ventricular endocardial border not optimally defined to evaluate regional wall motion. There is moderate left ventricular hypertrophy. Left ventricular diastolic parameters are consistent with Grade I diastolic dysfunction (impaired relaxation).   2. Right ventricular systolic function is normal. The right ventricular size is normal. Tricuspid regurgitation signal is inadequate for assessing PA pressure.  3. Right atrial size was mildly dilated.  4. The mitral valve is grossly normal. Mild mitral valve regurgitation.  5. The aortic valve is tricuspid. Aortic valve regurgitation is trivial. No aortic stenosis is present. FINDINGS  Left Ventricle: Left ventricular ejection fraction, by estimation, is 55 to 60%. The left ventricle has normal function. Left ventricular endocardial border not optimally defined to evaluate  regional wall motion. The left ventricular internal cavity size was normal in size. There is moderate left ventricular hypertrophy. Left ventricular diastolic parameters are consistent with Grade I diastolic dysfunction (impaired relaxation). Right Ventricle: The right ventricular size is normal. No increase in right ventricular wall thickness. Right ventricular systolic function is normal. Tricuspid regurgitation signal is inadequate for assessing PA pressure. Left Atrium: Left atrial size was normal in size. Right Atrium: Right atrial size was mildly dilated. Pericardium: The pericardium was not well visualized. Mitral Valve: The mitral valve is grossly normal. Mild mitral valve regurgitation. MV peak gradient, 5.6 mmHg. The mean mitral valve gradient is 3.0 mmHg. Tricuspid Valve: The tricuspid valve is normal in structure. Tricuspid valve regurgitation is trivial. Aortic Valve: The aortic valve is tricuspid. Aortic valve regurgitation is trivial. No aortic stenosis is present. Aortic valve mean gradient measures 4.0 mmHg. Aortic valve peak gradient measures 8.1 mmHg. Aortic valve area, by VTI measures 2.78 cm. Pulmonic Valve: The pulmonic valve was normal in structure. Pulmonic valve regurgitation is mild. No evidence of pulmonic stenosis. Aorta: The aortic root and ascending aorta are structurally normal, with no evidence of dilitation.  IAS/Shunts: The interatrial septum was not well visualized.  LEFT VENTRICLE PLAX 2D LVIDd:         5.10 cm      Diastology LVIDs:         3.40 cm      LV e' lateral:   10.10 cm/s LV PW:         1.60 cm      LV E/e' lateral: 8.5 LV IVS:        1.50 cm LVOT diam:     2.00 cm LV SV:         82 LV SV Index:   35 LVOT Area:     3.14 cm  LV Volumes (MOD) LV vol d, MOD A2C: 60.1 ml LV vol d, MOD A4C: 111.0 ml LV vol s, MOD A2C: 25.4 ml LV vol s, MOD A4C: 55.0 ml LV SV MOD A2C:     34.7 ml LV SV MOD A4C:     111.0 ml LV SV MOD BP:      51.1 ml RIGHT VENTRICLE RV Basal diam:  4.25 cm RV Mid diam:    3.60 cm RV S prime:     11.60 cm/s TAPSE (M-mode): 2.3 cm LEFT ATRIUM             Index        RIGHT ATRIUM           Index LA diam:        4.30 cm 1.85 cm/m   RA Area:     21.60 cm LA Vol (A2C):   54.0 ml 23.21 ml/m  RA Volume:   72.80 ml  31.29 ml/m LA Vol (A4C):   65.0 ml 27.94 ml/m LA Biplane Vol: 60.4 ml 25.96 ml/m  AORTIC VALVE                    PULMONIC VALVE AV Area (Vmax):    2.65 cm     PV Vmax:       1.55 m/s AV Area (Vmean):   2.78 cm     PV Peak grad:  9.6 mmHg AV Area (VTI):     2.78 cm AV Vmax:           142.00 cm/s AV Vmean:          94.400  cm/s AV VTI:            0.295 m AV Peak Grad:      8.1 mmHg AV Mean Grad:      4.0 mmHg LVOT Vmax:         120.00 cm/s LVOT Vmean:        83.500 cm/s LVOT VTI:          0.261 m LVOT/AV VTI ratio: 0.88  AORTA Ao Root diam: 3.50 cm Ao Asc diam:  3.30 cm MITRAL VALVE MV Area (PHT): 3.97 cm     SHUNTS MV Area VTI:   2.50 cm     Systemic VTI:  0.26 m MV Peak grad:  5.6 mmHg     Systemic Diam: 2.00 cm MV Mean grad:  3.0 mmHg MV Vmax:       1.18 m/s MV Vmean:      79.2 cm/s MV Decel Time: 191 msec MV E velocity: 85.40 cm/s MV A velocity: 111.00 cm/s MV E/A ratio:  0.77 Cristal Deer End MD Electronically signed by Yvonne Kendall MD Signature Date/Time: 09/27/2022/5:45:34 PM    Final     ASSESSMENT & PLAN:   Cancer of base of tongue (HCC) # JULY-AUG 2024-Clinically SCC  of left base of tongue-with left neck lymphadenopathy.  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 JULY 31st, 2024-  Left tongue base primary with ipsilateral level 2 nodal metastasis. A right-sided diminutive level 2 node is mildly hypermetabolic and suspicious for contralateral nodal metastasis.   No extracervical metastatic disease identified.  cisplatin weekly x6-8; with concurrent RT [last oct 9th, 2024]  # Proceed with cisplatin- weekly cisplatin #2/ of planned 6-8 cycles. Labs-CBC/chemistries were reviewed with the patient.  # Nausea- sec to cisplatin- G-2 continue anti-emetics-   # Ear ache/ neck pain- Insomnia- sec to left neck LN/ RT- will increase the dose of tramadol. New scipt sent.   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 Discussed regarding low calcium.continue calcium 1200 plus vitamin D-3 1000-over-the-counter-2 pill a day.   SEP 2024- vitamin D levels-p.   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 Stable.   # Gait instability- sec to BPPV- walks with cane/ monitor for neuropathy-   # Smoking/ Hx of smoking- quit > 25 years ago.    # Baseline weight:[240-250- July-AUG, 2024]s/p evaluation with  [facility] nutrition. Monitor closely.   # Hypermetabolic focus within the prostate in the setting of prostatomegaly.-PSA level- 1- WNL [Aug 2024. ]  # IV access: s/p port placement-- functional-   # Social support: poor social support- counseled   # DISPOSITION: As per IS-  Chemo tomorrow-  # follow up in 1 week-MD-; labs- cbc/cmp;mag;  phos; D-2 Cisplatin # follow up in 2 weeksMD-; labs- cbc/cmp;mag; phos; D-2 Cisplatin-Dr.B    All questions were answered. The patient knows to call the clinic with any problems, questions or concerns.    Earna Coder, MD 10/26/2022 10:44 AM

## 2022-10-26 NOTE — Assessment & Plan Note (Addendum)
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 JULY 31st, 2024-  Left tongue base primary with ipsilateral level 2 nodal metastasis. A right-sided diminutive level 2 node is mildly hypermetabolic and suspicious for contralateral nodal metastasis.   No extracervical metastatic disease identified.  cisplatin weekly x6-8; with concurrent RT [last oct 9th, 2024]  # Proceed with cisplatin- weekly cisplatin #2/ of planned 6-8 cycles. Labs-CBC/chemistries were reviewed with the patient.  # Nausea- sec to cisplatin- G-2 continue anti-emetics-   # Ear ache/ neck pain- Insomnia- sec to left neck LN/ RT- will increase the dose of tramadol. New scipt sent.   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 Discussed regarding low calcium.continue calcium 1200 plus vitamin D-3 1000-over-the-counter-2 pill a day.   SEP 2024- vitamin D levels-p.   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 Stable.   # Gait instability- sec to BPPV- walks with cane/ monitor for neuropathy-   # Smoking/ Hx of smoking- quit > 25 years ago.    # Baseline weight:[240-250- July-AUG, 2024]s/p evaluation with  [facility] nutrition. Monitor closely.   # Hypermetabolic focus within the prostate in the setting of prostatomegaly.-PSA level- 1- WNL [Aug 2024. ]  # IV access: s/p port placement-- functional-   # Social support: poor social support- counseled   # DISPOSITION: As per IS-  Chemo tomorrow-  # follow up in 1 week-MD-; labs- cbc/cmp;mag;  phos; D-2 Cisplatin # follow up in 2 weeksMD-; labs- cbc/cmp;mag; phos; D-2 Cisplatin-Dr.B

## 2022-10-26 NOTE — Telephone Encounter (Signed)
Prior auth submitted thru covermymeds with office notes attached: (Key: [id number]) PA Case ID #: [id number] Rx #: [id number] traMADol HCl 100MG  tablets

## 2022-10-26 NOTE — Telephone Encounter (Signed)
PA Case ID #: [id number] Rx #: [id number] Denied today by [facility] Medicare 2017 Denied. This drug is not covered on the formulary. We are denying your request because we do not show that you have tried tramadol 50mg  oral tablet. This covered drug is indicated to treat your condition. We may be able to make an exception to cover this drug. Your doctor will need to send [place] medical records showing that you tried this drug. If you cannot take the covered drug, your doctor will need to tell us why. Note: Some covered drug(s) may have quantity limits. Please refer to the formulary for details.  I will try to submit last office note(not today's 10/26/22)-aj

## 2022-10-27 ENCOUNTER — Inpatient Hospital Stay: Payer: Medicare Other

## 2022-10-27 ENCOUNTER — Ambulatory Visit: Admission: RE | Admit: 2022-10-27 | Payer: Medicare Other | Source: Ambulatory Visit

## 2022-10-27 ENCOUNTER — Other Ambulatory Visit: Payer: Self-pay

## 2022-10-27 VITALS — BP 155/71 | HR 66 | Temp 97.2°F | Resp 16

## 2022-10-27 DIAGNOSIS — C770 Secondary and unspecified malignant neoplasm of lymph nodes of head, face and neck: Secondary | ICD-10-CM | POA: Diagnosis not present

## 2022-10-27 DIAGNOSIS — C01 Malignant neoplasm of base of tongue: Secondary | ICD-10-CM | POA: Diagnosis not present

## 2022-10-27 DIAGNOSIS — Z5111 Encounter for antineoplastic chemotherapy: Secondary | ICD-10-CM | POA: Diagnosis not present

## 2022-10-27 DIAGNOSIS — K5900 Constipation, unspecified: Secondary | ICD-10-CM | POA: Diagnosis not present

## 2022-10-27 DIAGNOSIS — Z510 Encounter for antineoplastic radiation therapy: Secondary | ICD-10-CM | POA: Diagnosis not present

## 2022-10-27 LAB — RAD ONC ARIA SESSION SUMMARY
Course Elapsed Days: 14
Plan Fractions Treated to Date: 10
Plan Prescribed Dose Per Fraction: 2 Gy
Plan Total Fractions Prescribed: 35
Plan Total Prescribed Dose: 70 Gy
Reference Point Dosage Given to Date: 20 Gy
Reference Point Session Dosage Given: 2 Gy
Session Number: 10

## 2022-10-27 MED ORDER — SODIUM CHLORIDE 0.9 % IV SOLN
Freq: Once | INTRAVENOUS | Status: AC
  Filled 2022-10-27: qty 250

## 2022-10-27 MED ORDER — PALONOSETRON HCL INJECTION 0.25 MG/5ML
0.2500 mg | Freq: Once | INTRAVENOUS | Status: AC
  Administered 2022-10-27: 0.25 mg via INTRAVENOUS
  Filled 2022-10-27: qty 5

## 2022-10-27 MED ORDER — SODIUM CHLORIDE 0.9 % IV SOLN
150.0000 mg | Freq: Once | INTRAVENOUS | Status: AC
  Administered 2022-10-27: 150 mg via INTRAVENOUS
  Filled 2022-10-27: qty 150

## 2022-10-27 MED ORDER — SODIUM CHLORIDE 0.9 % IV SOLN
10.0000 mg | Freq: Once | INTRAVENOUS | Status: AC
  Administered 2022-10-27: 10 mg via INTRAVENOUS
  Filled 2022-10-27: qty 10

## 2022-10-27 MED ORDER — MAGNESIUM SULFATE 2 GM/50ML IV SOLN
2.0000 g | Freq: Once | INTRAVENOUS | Status: AC
  Administered 2022-10-27: 2 g via INTRAVENOUS
  Filled 2022-10-27: qty 50

## 2022-10-27 MED ORDER — HEPARIN SOD (PORK) LOCK FLUSH 100 UNIT/ML IV SOLN
500.0000 [IU] | Freq: Once | INTRAVENOUS | Status: DC | PRN
  Filled 2022-10-27: qty 5

## 2022-10-27 MED ORDER — SODIUM CHLORIDE 0.9 % IV SOLN
40.0000 mg/m2 | Freq: Once | INTRAVENOUS | Status: AC
  Administered 2022-10-27: 100 mg via INTRAVENOUS
  Filled 2022-10-27: qty 100

## 2022-10-27 MED ORDER — POTASSIUM CHLORIDE IN NACL 20-0.9 MEQ/L-% IV SOLN
Freq: Once | INTRAVENOUS | Status: AC
  Filled 2022-10-27: qty 1000

## 2022-10-27 NOTE — Progress Notes (Signed)
Nutrition Follow-up:  Patient with SCC of base of tongue.  Patient receiving concurrent chemotherapy and radiation.   Met with patient during infusion.  Able to eat chicken noodle soup yesterday, honey bun, tasty cake (lemon/blueberry), 3/4 of fish sandwich and small fries.  Coffee taste bitter to him now.  Most foods do not have much of a taste.  Tried the ensure and has bought some. Not drinking everyday.  Has never really like to try new foods or flavors.      Medications: carafate, MVI  Labs: Na 134, glucose 128, phosphorus 2.3, Mag 2.1, Vit d 55.70  Anthropometrics:   Weight 238 lb 9.6 oz on 9/3  245 lb 6.4 oz on 8/26 250 lb on 4/25  3% weight loss in the last week, significant   NUTRITION DIAGNOSIS: Inadequate oral intake ongoing    INTERVENTION:  Recommend 1-2 ensure complete shakes daily Start whole milk (at least 8 oz) with meals 2-3 times a day Add canned chicken to chicken noodle soup for more protein     MONITORING, EVALUATION, GOAL: weight trends, intake   NEXT VISIT: Thursday, Sept 12 during infusion  Maelle Sheaffer B. Freida Busman, RD, LDN Registered Dietitian [phone]

## 2022-10-27 NOTE — Patient Instructions (Signed)
[facility]  Discharge Instructions: Thank you for choosing [facility] to provide your oncology and hematology care.  If you have a lab appointment with the [facility], please go directly to the [facility] and check in at the registration area.  Wear comfortable clothing and clothing appropriate for easy access to any Portacath or PICC line.   We strive to give you quality time with your provider. You may need to reschedule your appointment if you arrive late (15 or more minutes).  Arriving late affects you and other patients whose appointments are after yours.  Also, if you miss three or more appointments without notifying the office, you may be dismissed from the clinic at the provider's discretion.      For prescription refill requests, have your pharmacy contact our office and allow 72 hours for refills to be completed.    Today you received the following chemotherapy and/or immunotherapy agents Cisplatin      To help prevent nausea and vomiting after your treatment, we encourage you to take your nausea medication as directed.  BELOW ARE SYMPTOMS THAT SHOULD BE REPORTED IMMEDIATELY: *FEVER GREATER THAN 100.4 F (38 C) OR HIGHER *CHILLS OR SWEATING *NAUSEA AND VOMITING THAT IS NOT CONTROLLED WITH YOUR NAUSEA MEDICATION *UNUSUAL SHORTNESS OF BREATH *UNUSUAL BRUISING OR BLEEDING *URINARY PROBLEMS (pain or burning when urinating, or frequent urination) *BOWEL PROBLEMS (unusual diarrhea, constipation, pain near the anus) TENDERNESS IN MOUTH AND THROAT WITH OR WITHOUT PRESENCE OF ULCERS (sore throat, sores in mouth, or a toothache) UNUSUAL RASH, SWELLING OR PAIN  UNUSUAL VAGINAL DISCHARGE OR ITCHING   Items with * indicate a potential emergency and should be followed up as soon as possible or go to the Emergency Department if any problems should occur.  Please show the CHEMOTHERAPY ALERT CARD or IMMUNOTHERAPY ALERT CARD at check-in to  the Emergency Department and triage nurse.  Should you have questions after your visit or need to cancel or reschedule your appointment, please contact [facility]  [phone] and follow the prompts.  Office hours are 8:00 a.m. to 4:30 p.m. Monday - Friday. Please note that voicemails left after 4:00 p.m. may not be returned until the following business day.  We are closed weekends and major holidays. You have access to a nurse at all times for urgent questions. Please call the main number to the clinic [phone] and follow the prompts.  For any non-urgent questions, you may also contact your provider using MyChart. We now offer e-Visits for anyone 28 and older to request care online for non-urgent symptoms. For details visit [email or web address].   Also download the MyChart app! Go to the app store, search "MyChart", open the app, select [facility], and log in with your MyChart username and password.

## 2022-10-27 NOTE — Telephone Encounter (Signed)
I am going to fax an appeal to [employer], since I have tried for 2 days to call them and the office is closed due to unforseen circumstances.

## 2022-10-28 ENCOUNTER — Other Ambulatory Visit: Payer: Self-pay

## 2022-10-28 ENCOUNTER — Ambulatory Visit
Admission: RE | Admit: 2022-10-28 | Discharge: 2022-10-28 | Disposition: A | Discharge status: Home / Self Care | Payer: Medicare Other | Source: Ambulatory Visit | Attending: Radiation Oncology | Admitting: Radiation Oncology

## 2022-10-28 ENCOUNTER — Telehealth: Payer: Self-pay | Admitting: *Deleted

## 2022-10-28 ENCOUNTER — Telehealth: Payer: Self-pay

## 2022-10-28 DIAGNOSIS — C770 Secondary and unspecified malignant neoplasm of lymph nodes of head, face and neck: Secondary | ICD-10-CM | POA: Diagnosis not present

## 2022-10-28 DIAGNOSIS — Z5111 Encounter for antineoplastic chemotherapy: Secondary | ICD-10-CM | POA: Diagnosis not present

## 2022-10-28 DIAGNOSIS — C01 Malignant neoplasm of base of tongue: Secondary | ICD-10-CM | POA: Diagnosis not present

## 2022-10-28 DIAGNOSIS — Z510 Encounter for antineoplastic radiation therapy: Secondary | ICD-10-CM | POA: Diagnosis not present

## 2022-10-28 DIAGNOSIS — K5900 Constipation, unspecified: Secondary | ICD-10-CM | POA: Diagnosis not present

## 2022-10-28 LAB — RAD ONC ARIA SESSION SUMMARY
Course Elapsed Days: 15
Plan Fractions Treated to Date: 11
Plan Prescribed Dose Per Fraction: 2 Gy
Plan Total Fractions Prescribed: 35
Plan Total Prescribed Dose: 70 Gy
Reference Point Dosage Given to Date: 22 Gy
Reference Point Session Dosage Given: 2 Gy
Session Number: 11

## 2022-10-28 NOTE — Telephone Encounter (Signed)
RN  called and left message on patient voicemail that it was not an issue that could be resolved by sending tramadol script to a different pharmacy.  The issue is [employer]/Medicare prescription plan was wanting additional information sent from ordering MD.  This nurse stated that the cancer center had sent all requested records as well as we have resent them today per [facility] request. We are hopeful that they will approve request so [facility] can fill medication.  Nurse also stated if he wanted to switch pharmacies then the process would still be the same [facility] and if that is his preference please call us or send a mychart message.

## 2022-10-28 NOTE — Telephone Encounter (Signed)
Patient states he [facility] in [place] on [address] and they told him he could get his tramadol prescription refilled today. He states he also called [facility] about his prescription and they stated they are still wanting  more information. Patient states he is all out of this medication and if [facility] could [facility] [place] that would be great.

## 2022-10-29 ENCOUNTER — Ambulatory Visit
Admission: RE | Admit: 2022-10-29 | Discharge: 2022-10-29 | Disposition: A | Discharge status: Home / Self Care | Payer: Medicare Other | Source: Ambulatory Visit | Attending: Radiation Oncology | Admitting: Radiation Oncology

## 2022-10-29 ENCOUNTER — Telehealth: Payer: Self-pay | Admitting: *Deleted

## 2022-10-29 ENCOUNTER — Other Ambulatory Visit: Payer: Self-pay

## 2022-10-29 DIAGNOSIS — C770 Secondary and unspecified malignant neoplasm of lymph nodes of head, face and neck: Secondary | ICD-10-CM | POA: Diagnosis not present

## 2022-10-29 DIAGNOSIS — K5900 Constipation, unspecified: Secondary | ICD-10-CM | POA: Diagnosis not present

## 2022-10-29 DIAGNOSIS — Z510 Encounter for antineoplastic radiation therapy: Secondary | ICD-10-CM | POA: Diagnosis not present

## 2022-10-29 DIAGNOSIS — Z5111 Encounter for antineoplastic chemotherapy: Secondary | ICD-10-CM | POA: Diagnosis not present

## 2022-10-29 DIAGNOSIS — C01 Malignant neoplasm of base of tongue: Secondary | ICD-10-CM | POA: Diagnosis not present

## 2022-10-29 LAB — RAD ONC ARIA SESSION SUMMARY
Course Elapsed Days: 16
Plan Fractions Treated to Date: 12
Plan Prescribed Dose Per Fraction: 2 Gy
Plan Total Fractions Prescribed: 35
Plan Total Prescribed Dose: 70 Gy
Reference Point Dosage Given to Date: 24 Gy
Reference Point Session Dosage Given: 2 Gy
Session Number: 12

## 2022-10-29 NOTE — Telephone Encounter (Signed)
Spoke to patient in clinic regarding his Tramadol. I gave him a copy of the insurance approval which was approved effective this morning for the Tramadol to be filled at [facility] in [place]. Pt very appreciative.

## 2022-11-01 ENCOUNTER — Ambulatory Visit
Admission: RE | Admit: 2022-11-01 | Discharge: 2022-11-01 | Disposition: A | Discharge status: Home / Self Care | Payer: Medicare Other | Source: Ambulatory Visit | Attending: Radiation Oncology | Admitting: Radiation Oncology

## 2022-11-01 ENCOUNTER — Other Ambulatory Visit: Payer: Self-pay

## 2022-11-01 DIAGNOSIS — K5900 Constipation, unspecified: Secondary | ICD-10-CM | POA: Diagnosis not present

## 2022-11-01 DIAGNOSIS — C01 Malignant neoplasm of base of tongue: Secondary | ICD-10-CM | POA: Diagnosis not present

## 2022-11-01 DIAGNOSIS — Z5111 Encounter for antineoplastic chemotherapy: Secondary | ICD-10-CM | POA: Diagnosis not present

## 2022-11-01 DIAGNOSIS — Z510 Encounter for antineoplastic radiation therapy: Secondary | ICD-10-CM | POA: Diagnosis not present

## 2022-11-01 DIAGNOSIS — C770 Secondary and unspecified malignant neoplasm of lymph nodes of head, face and neck: Secondary | ICD-10-CM | POA: Diagnosis not present

## 2022-11-01 LAB — RAD ONC ARIA SESSION SUMMARY
Course Elapsed Days: 19
Plan Fractions Treated to Date: 13
Plan Prescribed Dose Per Fraction: 2 Gy
Plan Total Fractions Prescribed: 35
Plan Total Prescribed Dose: 70 Gy
Reference Point Dosage Given to Date: 26 Gy
Reference Point Session Dosage Given: 2 Gy
Session Number: 13

## 2022-11-02 ENCOUNTER — Ambulatory Visit
Admission: RE | Admit: 2022-11-02 | Discharge: 2022-11-02 | Disposition: A | Discharge status: Home / Self Care | Payer: Medicare Other | Source: Ambulatory Visit | Attending: Radiation Oncology | Admitting: Radiation Oncology

## 2022-11-02 ENCOUNTER — Other Ambulatory Visit: Payer: Self-pay

## 2022-11-02 DIAGNOSIS — C770 Secondary and unspecified malignant neoplasm of lymph nodes of head, face and neck: Secondary | ICD-10-CM | POA: Diagnosis not present

## 2022-11-02 DIAGNOSIS — Z5111 Encounter for antineoplastic chemotherapy: Secondary | ICD-10-CM | POA: Diagnosis not present

## 2022-11-02 DIAGNOSIS — Z510 Encounter for antineoplastic radiation therapy: Secondary | ICD-10-CM | POA: Diagnosis not present

## 2022-11-02 DIAGNOSIS — C01 Malignant neoplasm of base of tongue: Secondary | ICD-10-CM | POA: Diagnosis not present

## 2022-11-02 DIAGNOSIS — K5900 Constipation, unspecified: Secondary | ICD-10-CM | POA: Diagnosis not present

## 2022-11-02 LAB — RAD ONC ARIA SESSION SUMMARY
Course Elapsed Days: 20
Plan Fractions Treated to Date: 14
Plan Prescribed Dose Per Fraction: 2 Gy
Plan Total Fractions Prescribed: 35
Plan Total Prescribed Dose: 70 Gy
Reference Point Dosage Given to Date: 28 Gy
Reference Point Session Dosage Given: 2 Gy
Session Number: 14

## 2022-11-03 ENCOUNTER — Ambulatory Visit
Admission: RE | Admit: 2022-11-03 | Discharge: 2022-11-03 | Disposition: A | Discharge status: Home / Self Care | Payer: Medicare Other | Source: Ambulatory Visit | Attending: Radiation Oncology | Admitting: Radiation Oncology

## 2022-11-03 ENCOUNTER — Encounter: Payer: Self-pay | Admitting: Internal Medicine

## 2022-11-03 ENCOUNTER — Inpatient Hospital Stay (HOSPITAL_BASED_OUTPATIENT_CLINIC_OR_DEPARTMENT_OTHER): Payer: Medicare Other | Admitting: Internal Medicine

## 2022-11-03 ENCOUNTER — Other Ambulatory Visit: Payer: Self-pay

## 2022-11-03 ENCOUNTER — Inpatient Hospital Stay: Payer: Medicare Other

## 2022-11-03 VITALS — BP 117/84 | HR 68 | Temp 98.8°F | Resp 18 | Ht 72.0 in | Wt 234.4 lb

## 2022-11-03 DIAGNOSIS — C01 Malignant neoplasm of base of tongue: Secondary | ICD-10-CM

## 2022-11-03 DIAGNOSIS — C770 Secondary and unspecified malignant neoplasm of lymph nodes of head, face and neck: Secondary | ICD-10-CM | POA: Diagnosis not present

## 2022-11-03 DIAGNOSIS — Z5111 Encounter for antineoplastic chemotherapy: Secondary | ICD-10-CM | POA: Diagnosis not present

## 2022-11-03 DIAGNOSIS — K5900 Constipation, unspecified: Secondary | ICD-10-CM | POA: Diagnosis not present

## 2022-11-03 DIAGNOSIS — Z510 Encounter for antineoplastic radiation therapy: Secondary | ICD-10-CM | POA: Diagnosis not present

## 2022-11-03 LAB — RAD ONC ARIA SESSION SUMMARY
Course Elapsed Days: 21
Plan Fractions Treated to Date: 15
Plan Prescribed Dose Per Fraction: 2 Gy
Plan Total Fractions Prescribed: 35
Plan Total Prescribed Dose: 70 Gy
Reference Point Dosage Given to Date: 30 Gy
Reference Point Session Dosage Given: 2 Gy
Session Number: 15

## 2022-11-03 LAB — CMP (CANCER CENTER ONLY)
ALT: 21 U/L (ref 0–44)
AST: 20 U/L (ref 15–41)
Albumin: 3.7 g/dL (ref 3.5–5.0)
Alkaline Phosphatase: 60 U/L (ref 38–126)
Anion gap: 5 (ref 5–15)
BUN: 19 mg/dL (ref 8–23)
CO2: 26 mmol/L (ref 22–32)
Calcium: 8.6 mg/dL — ABNORMAL LOW (ref 8.9–10.3)
Chloride: 101 mmol/L (ref 98–111)
Creatinine: 1.08 mg/dL (ref 0.61–1.24)
GFR, Estimated: >60 mL/min (ref >60)
Glucose, Bld: 115 mg/dL — ABNORMAL HIGH (ref 70–99)
Potassium: 4.1 mmol/L (ref 3.5–5.1)
Sodium: 132 mmol/L — ABNORMAL LOW (ref 135–145)
Total Bilirubin: 0.7 mg/dL (ref 0.3–1.2)
Total Protein: 6.6 g/dL (ref 6.5–8.1)

## 2022-11-03 LAB — CBC WITH DIFFERENTIAL (CANCER CENTER ONLY)
Abs Immature Granulocytes: 0.02 10*3/uL (ref 0.00–0.07)
Basophils Absolute: 0 10*3/uL (ref 0.0–0.1)
Basophils Relative: 1 %
Eosinophils Absolute: 0.1 10*3/uL (ref 0.0–0.5)
Eosinophils Relative: 1 %
HCT: 33.7 % — ABNORMAL LOW (ref 39.0–52.0)
Hemoglobin: 11.4 g/dL — ABNORMAL LOW (ref 13.0–17.0)
Immature Granulocytes: 0 %
Lymphocytes Relative: 9 %
Lymphs Abs: 0.6 10*3/uL — ABNORMAL LOW (ref 0.7–4.0)
MCH: 31.2 pg (ref 26.0–34.0)
MCHC: 33.8 g/dL (ref 30.0–36.0)
MCV: 92.3 fL (ref 80.0–100.0)
Monocytes Absolute: 0.7 10*3/uL (ref 0.1–1.0)
Monocytes Relative: 10 %
Neutro Abs: 5.2 10*3/uL (ref 1.7–7.7)
Neutrophils Relative %: 79 %
Platelet Count: 146 10*3/uL — ABNORMAL LOW (ref 150–400)
RBC: 3.65 MIL/uL — ABNORMAL LOW (ref 4.22–5.81)
RDW: 12.4 % (ref 11.5–15.5)
WBC Count: 6.6 10*3/uL (ref 4.0–10.5)
nRBC: 0 % (ref 0.0–0.2)

## 2022-11-03 LAB — PHOSPHORUS: Phosphorus: 3.3 mg/dL (ref 2.5–4.6)

## 2022-11-03 LAB — MAGNESIUM: Magnesium: 1.9 mg/dL (ref 1.7–2.4)

## 2022-11-03 MED ORDER — SODIUM CHLORIDE 0.9% FLUSH
10.0000 mL | INTRAVENOUS | Status: DC | PRN
  Filled 2022-11-03: qty 10

## 2022-11-03 MED ORDER — HEPARIN SOD (PORK) LOCK FLUSH 100 UNIT/ML IV SOLN
500.0000 [IU] | Freq: Once | INTRAVENOUS | Status: AC
  Administered 2022-11-03: 500 [IU] via INTRAVENOUS
  Filled 2022-11-03: qty 5

## 2022-11-03 MED ORDER — OLANZAPINE 5 MG PO TABS: ORAL_TABLET | ORAL | 0 refills | Status: DC

## 2022-11-03 MED FILL — Dexamethasone Sodium Phosphate Inj 100 MG/10ML: INTRAMUSCULAR | Qty: 1 | Status: AC

## 2022-11-03 MED FILL — Fosaprepitant Dimeglumine For IV Infusion 150 MG (Base Eq): INTRAVENOUS | Qty: 5 | Status: AC

## 2022-11-03 NOTE — Patient Instructions (Signed)
#   re: Constipation recommend MiraLAX 1 scoop once or twice a day; 1 scoop in 8 ounces lukewarm water/juice.   

## 2022-11-03 NOTE — Progress Notes (Signed)
Pt complains of nausea. States the nausea medicines are not working.Instructed him to start out the day with ondansetron than alternate with compazine every 8 hours.  Dry mouth from radiation. No appetite food tastes bad. States his bowels are not moving . Instructed him to get miralax and a stool softener. Pt has pain in his left lymph node in neck area; the pain goes into his ear and temple which creates headaches. Pain now is a 3/10. At worst it is a 10/10. Pain never completely goes away. Drinks ensure as appetite is diminished.

## 2022-11-03 NOTE — Progress Notes (Signed)
[facility] CONSULT NOTE  Patient Care Team: Patient, No Pcp Per as PCP - General (General Practice) End, Cristal Deer, MD as PCP - Cardiology (Cardiology) Carmina Miller, MD as Consulting Physician (Radiation Oncology) Earna Coder, MD as Consulting Physician (Oncology)  CHIEF COMPLAINTS/PURPOSE OF CONSULTATION: head and neck cancer  #  Oncology History Overview Note  JULY, 2024- CT scan:  1. 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 2. Metastatic lymphadenopathy at the level 2 station on the left measuring 2.7 x 2.5 x 2.1 cm. 3. Atherosclerotic calcification of the carotid bifurcations.  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   Alone.   Cory Hess 77 y.o.  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  Complains of ongoing nausea. Unfortunately as per the pt the nausea medicines are not working.  Dry mouth from radiation. No appetite food tastes bad. States his bowels are not moving.  Pt has pain in his left lymph node in neck area; the pain goes into his ear and temple which creates headaches. Pain now is a 3/10. At worst it is a 10/10.  Currently on tramadol prn.   Pain never completely goes away. Drinks ensure as appetite is diminished.     Review of Systems  Constitutional:   Positive for malaise/fatigue and weight loss. Negative for chills, diaphoresis and fever.  HENT:  Negative for nosebleeds and sore throat.   Eyes:  Negative for double vision.  Respiratory:  Negative for cough, hemoptysis, sputum production, shortness of breath and wheezing.   Cardiovascular:  Negative for chest pain, palpitations, orthopnea and leg swelling.  Gastrointestinal:  Positive for nausea. Negative for abdominal pain, blood in stool, constipation, diarrhea, heartburn, melena and vomiting.  Genitourinary:  Negative for dysuria, frequency and urgency.  Musculoskeletal:  Positive for back pain and joint pain.  Skin: Negative.  Negative for itching and rash.  Neurological:  Negative for dizziness, tingling, focal weakness, weakness and headaches.  Endo/Heme/Allergies:  Does not bruise/bleed easily.  Psychiatric/Behavioral:  Negative for depression. The patient is not nervous/anxious and does not have insomnia.      MEDICAL HISTORY:  Past Medical History:  Diagnosis Date   Alternating RBBB & LBBB    Ankle swelling 2024   Arthritis    neck   Bladder tumor    CAD (coronary artery disease)    a. 08/2017 STEMI/Cath: LM 80ost/d, LAD 100p, LCX 95 ost/p, RCA 60p, 62m, RPDA 80ost; b. 08/2017 CABG x 3: LIMA->LAD, VG->OM, VG->PDA.   Cancer [facility])    bladder tumor   Cancer of base of tongue (HCC)    Essential hypertension    HFrEF (heart failure with reduced ejection fraction) (HCC)    a. 08/2017 TEE: EF 30-35%, sept/ant/inf HK, apical AK. 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 12/2017 Echo: EF 45-50%,  diff HK. Gr1 DD. Mildly reduced RV fxn. Mild RAE.   Hyperlipidemia LDL goal <70    Ischemic cardiomyopathy    a. 08/2017 TEE: EF 30-35%; b. 12/2017 Echo: EF 45-50%.   Myocardial infarction (HCC) 08/27/2017   Shingles    2018 - right side   Vertigo    several months ago    SURGICAL HISTORY: Past Surgical History:  Procedure Laterality Date   CARDIAC CATHETERIZATION     CATARACT EXTRACTION W/PHACO  Left 07/18/2018   Procedure: CATARACT EXTRACTION PHACO AND INTRAOCULAR LENS PLACEMENT (IOC)  LEFT;  Surgeon: Nevada Crane, MD;  Location: [facility];  Service: Ophthalmology;  Laterality: Left;   CATARACT EXTRACTION W/PHACO Right 08/14/2018   Procedure: CATARACT EXTRACTION PHACO AND INTRAOCULAR LENS PLACEMENT (IOC)  RIGHT;  Surgeon: Nevada Crane, MD;  Location: [facility];  Service: Ophthalmology;  Laterality: Right;   CORONARY ARTERY BYPASS GRAFT N/A 08/27/2017   Procedure: CORONARY ARTERY BYPASS GRAFTING (CABG) ON PUMP USING LEFT INTERNAL MAMMARY ARTERY AND LEFT GREATER SAPHENOUS VEIN VIA ENDOVEIN HARVEST;  Surgeon: Alleen Borne, MD;  Location: [facility];  Service: Open Heart Surgery;  Laterality: N/A;   CORONARY/GRAFT ACUTE MI REVASCULARIZATION N/A 08/27/2017   Procedure: Coronary/Graft Acute MI Revascularization;  Surgeon: Iran Ouch, MD;  Location: [facility];  Service: Cardiovascular;  Laterality: N/A;   CYSTOSCOPY W/ RETROGRADES Bilateral 12/25/2018   Procedure: CYSTOSCOPY WITH RETROGRADE PYELOGRAM;  Surgeon: Vanna Scotland, MD;  Location: [facility];  Service: Urology;  Laterality: Bilateral;   FRACTURE SURGERY Right 1958   arm and left wrist compound fracture, no metal   HERNIA REPAIR Right    inguinial   IR IMAGING GUIDED PORT INSERTION  10/15/2022   LEFT HEART CATH AND CORONARY ANGIOGRAPHY N/A 08/27/2017   Procedure: LEFT HEART CATH AND CORONARY ANGIOGRAPHY;  Surgeon: Iran Ouch, MD;  Location: [facility] CV [facility];  Service: Cardiovascular;  Laterality: N/A;   TRANSURETHRAL RESECTION OF BLADDER TUMOR WITH MITOMYCIN-C N/A 12/25/2018   Procedure: TRANSURETHRAL RESECTION OF BLADDER TUMOR WITH Gemcitabine;  Surgeon: Vanna Scotland, MD;  Location: [facility] OR[facility];  Service: Urology;  Laterality: N/A;    SOCIAL HISTORY: Social History   Socioeconomic History   Marital status: Single    Spouse name: Not on file   Number of children: Not on file    Years of education: Not on file   Highest education level: Not on file  Occupational History   Occupation: supervision, Research scientist (life sciences)    Comment: retired  Tobacco Use   Smoking status: Former    Current packs/day: 0.00    Types: Cigarettes    Quit date: 2000    Years since quitting: 24.7   Smokeless tobacco: Current    Types: Snuff  Vaping Use   Vaping status: Never Used  Substance and Sexual Activity   Alcohol use: Not Currently   Drug use: Not Currently   Sexual activity: Not on file  Other Topics Concern   Not on file  Social History Narrative   Lives alone   Social Determinants of Health   Financial Resource Strain: Low Risk  (11/06/2018)   Overall Financial Resource Strain (CARDIA)    Difficulty of Paying Living Expenses: Not hard at all  Food Insecurity: No Food Insecurity (09/10/2022)   Hunger Vital Sign    Worried About Running Out of Food in the Last Year: Never true    Ran Out of Food in the Last Year: Never true  Transportation Needs: Not on file (09/07/2022)  Physical Activity: Inactive (11/06/2018)   Exercise Vital Sign    Days of Exercise per Week: 0 days    Minutes of Exercise per Session: 0 min  Stress: No Stress Concern Present (11/06/2018)   [employer] of Occupational Health - Occupational Stress Questionnaire    Feeling of Stress : Not at all  Social Connections: Socially Isolated (11/06/2018)   Social Connection and Isolation Panel [NHANES]    Frequency of Communication with Friends and Family: Once a week    Frequency of Social Gatherings with Friends and Family: Once a week    Attends Religious Services: Never    Database administrator or Organizations: No    Attends Banker Meetings: Never    Marital Status: Never married  Intimate Partner Violence: Not At Risk (09/10/2022)   Humiliation, Afraid, Rape, and Kick questionnaire    Fear of Current or Ex-Partner: No    Emotionally Abused: No    Physically Abused: No     Sexually Abused: No    FAMILY HISTORY: Family History  Problem Relation Age of Onset   Heart failure Mother    Lung disease Father    Stroke Father    Cervical cancer Maternal Grandmother     ALLERGIES:  has No Known Allergies.  MEDICATIONS:  Current Outpatient Medications  Medication Sig Dispense Refill   acetaminophen (TYLENOL) 650 MG CR tablet Take 650 mg by mouth every 8 (eight) hours as needed for pain.     aspirin EC 81 MG EC tablet Take 1 tablet (81 mg total) by mouth daily.     atorvastatin (LIPITOR) 80 MG tablet Take 1 tablet by mouth once daily 90 tablet 0   carvedilol (COREG) 12.5 MG tablet Take 1 tablet (12.5 mg total) by mouth 2 (two) times daily with a meal. PLEASE CALL OFFICE TO SCHEDULE APPOINTMENT PRIOR TO NEXT REFILL 180 tablet 0   lidocaine-prilocaine (EMLA) cream Apply on the port. 30 -45 min  prior to port access. 30 g 3   losartan (COZAAR) 100 MG tablet Take 1 tablet (100 mg total) by mouth daily. PLEASE CALL OFFICE TO SCHEDULE APPOINTMENT PRIOR TO NEXT REFILL 90 tablet 0   Multiple Vitamin (MULTIVITAMIN WITH MINERALS) TABS tablet Take 1 tablet by mouth daily. One a Day over 50/ lunch     OLANZapine (ZYPREXA) 5 MG tablet Take one pill at night. 30 tablet 0   ondansetron (ZOFRAN) 8 MG tablet One pill every 8 hours as needed for nausea/vomitting. 40 tablet 1   potassium chloride (KLOR-CON) 10 MEQ tablet Take 1 tablet (10 mEq total) by mouth daily. 90 tablet 3   prochlorperazine (COMPAZINE) 10 MG tablet Take 1 tablet (10 mg total) by mouth every 6 (six) hours as needed for nausea or vomiting. 40 tablet 1   torsemide (DEMADEX) 20 MG tablet Take 1 tablet (20 mg total) by mouth 2 (two) times daily. 180 tablet 3   traMADol HCl 100 MG TABS Take 100 mg by mouth every 8 (eight) hours as needed. 30 tablet 0   nitroGLYCERIN (NITROSTAT) 0.4 MG SL tablet DISSOLVE ONE TABLET UNDER THE TONGUE EVERY 5 MINUTES AS NEEDED FOR CHEST PAIN.  DO NOT EXCEED A TOTAL OF 3 DOSES IN 15 MINUTES  (Patient not taking: Reported on 11/03/2022) 25 tablet 0   sucralfate (CARAFATE) 1 g tablet Take 1 tablet (1 g total) by mouth 4 (four) times daily -  with meals and at bedtime.  Dissolve in 4 tablespoons warm water 30 minutes before meals. (Patient not taking: Reported on 11/03/2022) 90 tablet 1   No current facility-administered medications for this visit.   Facility-Administered Medications Ordered in Other Visits  Medication Dose Route Frequency Provider Last Rate Last Admin   sodium chloride flush (NS) 0.9 % injection 10 mL  10 mL Intravenous PRN Earna Coder, MD         PHYSICAL EXAMINATION:   Vitals:   11/03/22 1347  BP: 117/84  Pulse: 68  Resp: 18  Temp: 98.8 F (37.1 C)     Filed Weights   11/03/22 1347  Weight: 234 lb 6.4 oz (106.3 kg)    Left neck 2 cm submandibular LN present.   Physical Exam Vitals and nursing note reviewed.  HENT:     Head: Normocephalic and atraumatic.     Mouth/Throat:     Pharynx: Oropharynx is clear.  Eyes:     Extraocular Movements: Extraocular movements intact.     Pupils: Pupils are equal, round, and reactive to light.  Cardiovascular:     Rate and Rhythm: Normal rate and regular rhythm.  Pulmonary:     Comments: Decreased breath sounds bilaterally.  Abdominal:     Palpations: Abdomen is soft.  Musculoskeletal:        General: Normal range of motion.     Cervical back: Normal range of motion.  Skin:    General: Skin is warm.  Neurological:     General: No focal deficit present.     Mental Status: He is alert and oriented to person, place, and time.  Psychiatric:        Behavior: Behavior normal.        Judgment: Judgment normal.      LABORATORY DATA:  I have reviewed the data as listed Lab Results  Component Value Date   WBC 6.6 11/03/2022   HGB 11.4 (L) 11/03/2022   HCT 33.7 (L) 11/03/2022   MCV 92.3 11/03/2022   PLT 146 (L) 11/03/2022   Recent Labs    08/25/22 0947 10/05/22 1629 10/18/22 0947  10/26/22 0906 11/03/22 1309  NA 139   < > 137 134* 132*  K 4.1   < > 4.2 3.6 4.1  CL 111   < > 109 103 101  CO2 23   < > 23 25 26   GLUCOSE 110*   < > 122* 128* 115*  BUN 19   < > 15 17 19   CREATININE 1.41*   < > 0.97 1.05 1.08  CALCIUM 8.9   < > 8.5* 8.5* 8.6*  GFRNONAA 52*  --  >60 >60 >60  PROT 7.1  --   --   --  6.6  ALBUMIN 4.0  --   --   --  3.7  AST 26  --   --   --  20  ALT 19  --   --   --  21  ALKPHOS 53  --   --   --  60  BILITOT 0.9  --   --   --  0.7   < > = values in this interval not displayed.    RADIOGRAPHIC STUDIES: I have personally reviewed the radiological images as listed and agreed with the findings in the report. IR IMAGING GUIDED PORT INSERTION  Result Date: 10/15/2022 INDICATION: Chemotherapy EXAM: Chest port placement using ultrasound and fluoroscopic guidance MEDICATIONS: Documented in the EMR ANESTHESIA/SEDATION: Moderate (conscious) sedation was employed during this procedure.  A total of Versed 1.5 mg and Fentanyl 100 mcg was administered intravenously. Moderate Sedation Time: 25 minutes. The patient's level of consciousness and vital signs were monitored continuously by radiology nursing throughout the procedure under my direct supervision. FLUOROSCOPY TIME:  Fluoroscopy Time: 0.5 minutes (6 mGy) COMPLICATIONS: None immediate. PROCEDURE: Informed written consent was obtained from the patient after a thorough discussion of the procedural risks, benefits and alternatives. All questions were addressed. Maximal Sterile Barrier Technique was utilized including caps, mask, sterile gowns, sterile gloves, sterile drape, hand hygiene and skin antiseptic. A timeout was performed prior to the initiation of the procedure. The patient was placed supine on the exam table. The right neck and chest was prepped and draped in the standard sterile fashion. A preliminary ultrasound of the right neck was performed and demonstrates a patent right internal jugular vein. A permanent  ultrasound image was stored in the electronic medical record. The overlying skin was anesthetized with 1% Lidocaine. Using ultrasound guidance, access was obtained into the right internal jugular vein using a 21 gauge micropuncture set. A wire was advanced into the SVC, a short incision was made at the puncture site, and serial dilatation performed. Next, in an ipsilateral infraclavicular location, an incision was made at the site of the subcutaneous reservoir. Blunt dissection was used to open a pocket to contain the reservoir. A subcutaneous tunnel was then created from the port site to the puncture site. A(n) 8 Fr single lumen catheter was advanced through the tunnel. The catheter was attached to the port and this was placed in the subcutaneous pocket. Under fluoroscopic guidance, a peel away sheath was placed, and the catheter was trimmed to the appropriate length and was advanced into the central veins. The catheter length is 23 cm. The tip of the catheter lies near the superior cavoatrial junction. The port flushes and aspirates appropriately. The port was flushed and locked with heparinized saline. The port pocket was closed in 2 layers using 3-0 and 4-0 Vicryl/absorbable suture. Dermabond was also applied to both incisions. The patient tolerated the procedure well and was transferred to recovery in stable condition. IMPRESSION: Successful right-sided chest port placement via the right internal jugular vein. The port is ready for immediate use. Electronically Signed   By: Olive Bass M.D.   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 JULY 31st, 2024-  Left tongue base primary with ipsilateral level 2 nodal metastasis. A  right-sided diminutive level 2 node is mildly hypermetabolic and suspicious for contralateral nodal metastasis.   No extracervical metastatic disease identified.  cisplatin weekly x6-8; with concurrent RT [last oct 9th, 2024]  # Proceed with cisplatin- weekly cisplatin #3/ of planned 6-8 cycles. Labs-CBC/chemistries were reviewed with the patient.  # Nausea- sec to cisplatin- G-2 continue anti-emetics- add zyprexa.   # Ear ache/ neck pain- Insomnia- sec to left neck LN/ RT-  stable; continue current dose of tramadol.  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 Discussed regarding low calcium.continue calcium 1200 plus vitamin D-3 1000-over-the-counter-2 pill a day.   SEP 2024- vitamin D levels-p.   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 Stable.   # Gait instability- sec to BPPV- walks with  cane/ monitor for neuropathy-    # Baseline weight:[240-250- July-AUG, 2024]s/p evaluation with  [facility] nutrition. Monitor closely.   # Hypermetabolic focus within the prostate in the setting of prostatomegaly.-PSA level- 1- WNL [Aug 2024. ]  # IV access: s/p port placement-- functional-   # Social support: poor social support- counseled   # DISPOSITION: As per IS-  Chemo tomorrow-  # follow up in 1 week-MD-; labs- cbc/cmp;mag;  phos; D-2 Cisplatin # follow up in 2 weeksMD-; labs- cbc/cmp;mag; phos; D-2 Cisplatin-Dr.B    All questions were answered. The patient knows to call the clinic with any problems, questions or concerns.    Earna Coder, MD 11/03/2022 2:25 PM

## 2022-11-03 NOTE — Assessment & Plan Note (Addendum)
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 JULY 31st, 2024-  Left tongue base primary with ipsilateral level 2 nodal metastasis. A right-sided diminutive level 2 node is mildly hypermetabolic and suspicious for contralateral nodal metastasis.   No extracervical metastatic disease identified.  cisplatin weekly x6-8; with concurrent RT [last oct 9th, 2024]  # Proceed with cisplatin- weekly cisplatin #3/ of planned 6-8 cycles. Labs-CBC/chemistries were reviewed with the patient.  # Nausea- sec to cisplatin- G-2 continue anti-emetics- add zyprexa.   # Ear ache/ neck pain- Insomnia- sec to left neck LN/ RT-  stable; continue current dose of tramadol.  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 Discussed regarding low calcium.continue calcium 1200 plus vitamin D-3 1000-over-the-counter-2 pill a day.   SEP 2024- vitamin D levels-p.   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 Stable.   # Gait instability- sec to BPPV- walks with cane/ monitor for neuropathy-    # Baseline weight:[240-250- July-AUG, 2024]s/p evaluation with  [facility]. Monitor closely.   # Hypermetabolic focus within the prostate in the setting of prostatomegaly.-PSA level- 1- WNL [Aug 2024. ]  # IV access: s/p port placement-- functional-   # Social support: poor social support- counseled   # DISPOSITION: As per IS-  Chemo tomorrow-  # follow up in 1 week-MD-; labs- cbc/cmp;mag;  phos; D-2 Cisplatin # follow up in 2 weeksMD-; labs- cbc/cmp;mag; phos; D-2 Cisplatin-Dr.B

## 2022-11-04 ENCOUNTER — Ambulatory Visit
Admission: RE | Admit: 2022-11-04 | Discharge: 2022-11-04 | Disposition: A | Discharge status: Home / Self Care | Payer: Medicare Other | Source: Ambulatory Visit | Attending: Radiation Oncology | Admitting: Radiation Oncology

## 2022-11-04 ENCOUNTER — Telehealth: Payer: Self-pay

## 2022-11-04 ENCOUNTER — Inpatient Hospital Stay: Payer: Medicare Other

## 2022-11-04 ENCOUNTER — Other Ambulatory Visit: Payer: Self-pay

## 2022-11-04 VITALS — BP 143/79 | HR 65 | Temp 96.9°F | Resp 18

## 2022-11-04 DIAGNOSIS — K5900 Constipation, unspecified: Secondary | ICD-10-CM | POA: Diagnosis not present

## 2022-11-04 DIAGNOSIS — C01 Malignant neoplasm of base of tongue: Secondary | ICD-10-CM | POA: Diagnosis not present

## 2022-11-04 DIAGNOSIS — Z5111 Encounter for antineoplastic chemotherapy: Secondary | ICD-10-CM | POA: Diagnosis not present

## 2022-11-04 DIAGNOSIS — C770 Secondary and unspecified malignant neoplasm of lymph nodes of head, face and neck: Secondary | ICD-10-CM | POA: Diagnosis not present

## 2022-11-04 DIAGNOSIS — Z510 Encounter for antineoplastic radiation therapy: Secondary | ICD-10-CM | POA: Diagnosis not present

## 2022-11-04 LAB — RAD ONC ARIA SESSION SUMMARY
Course Elapsed Days: 22
Plan Fractions Treated to Date: 16
Plan Prescribed Dose Per Fraction: 2 Gy
Plan Total Fractions Prescribed: 35
Plan Total Prescribed Dose: 70 Gy
Reference Point Dosage Given to Date: 32 Gy
Reference Point Session Dosage Given: 2 Gy
Session Number: 16

## 2022-11-04 MED ORDER — SODIUM CHLORIDE 0.9 % IV SOLN
150.0000 mg | Freq: Once | INTRAVENOUS | Status: AC
  Administered 2022-11-04: 150 mg via INTRAVENOUS
  Filled 2022-11-04: qty 150

## 2022-11-04 MED ORDER — POTASSIUM CHLORIDE IN NACL 20-0.9 MEQ/L-% IV SOLN
Freq: Once | INTRAVENOUS | Status: AC
  Filled 2022-11-04: qty 1000

## 2022-11-04 MED ORDER — SODIUM CHLORIDE 0.9 % IV SOLN
10.0000 mg | Freq: Once | INTRAVENOUS | Status: AC
  Administered 2022-11-04: 10 mg via INTRAVENOUS
  Filled 2022-11-04: qty 10

## 2022-11-04 MED ORDER — SODIUM CHLORIDE 0.9 % IV SOLN
Freq: Once | INTRAVENOUS | Status: AC
  Filled 2022-11-04: qty 250

## 2022-11-04 MED ORDER — HEPARIN SOD (PORK) LOCK FLUSH 100 UNIT/ML IV SOLN
500.0000 [IU] | Freq: Once | INTRAVENOUS | Status: DC | PRN
  Filled 2022-11-04: qty 5

## 2022-11-04 MED ORDER — MAGNESIUM SULFATE 2 GM/50ML IV SOLN
2.0000 g | Freq: Once | INTRAVENOUS | Status: AC
  Administered 2022-11-04: 2 g via INTRAVENOUS
  Filled 2022-11-04: qty 50

## 2022-11-04 MED ORDER — PALONOSETRON HCL INJECTION 0.25 MG/5ML
0.2500 mg | Freq: Once | INTRAVENOUS | Status: AC
  Administered 2022-11-04: 0.25 mg via INTRAVENOUS
  Filled 2022-11-04: qty 5

## 2022-11-04 MED ORDER — SODIUM CHLORIDE 0.9 % IV SOLN
40.0000 mg/m2 | Freq: Once | INTRAVENOUS | Status: AC
  Administered 2022-11-04: 100 mg via INTRAVENOUS
  Filled 2022-11-04: qty 100

## 2022-11-04 NOTE — Patient Instructions (Signed)
[facility]  Discharge Instructions: Thank you for choosing [facility] to provide your oncology and hematology care.  If you have a lab appointment with the [facility], please go directly to the [facility] and check in at the registration area.  Wear comfortable clothing and clothing appropriate for easy access to any Portacath or PICC line.   We strive to give you quality time with your provider. You may need to reschedule your appointment if you arrive late (15 or more minutes).  Arriving late affects you and other patients whose appointments are after yours.  Also, if you miss three or more appointments without notifying the office, you may be dismissed from the clinic at the provider's discretion.      For prescription refill requests, have your pharmacy contact our office and allow 72 hours for refills to be completed.    Today you received the following chemotherapy and/or immunotherapy agents Cisplatin      To help prevent nausea and vomiting after your treatment, we encourage you to take your nausea medication as directed.  BELOW ARE SYMPTOMS THAT SHOULD BE REPORTED IMMEDIATELY: *FEVER GREATER THAN 100.4 F (38 C) OR HIGHER *CHILLS OR SWEATING *NAUSEA AND VOMITING THAT IS NOT CONTROLLED WITH YOUR NAUSEA MEDICATION *UNUSUAL SHORTNESS OF BREATH *UNUSUAL BRUISING OR BLEEDING *URINARY PROBLEMS (pain or burning when urinating, or frequent urination) *BOWEL PROBLEMS (unusual diarrhea, constipation, pain near the anus) TENDERNESS IN MOUTH AND THROAT WITH OR WITHOUT PRESENCE OF ULCERS (sore throat, sores in mouth, or a toothache) UNUSUAL RASH, SWELLING OR PAIN  UNUSUAL VAGINAL DISCHARGE OR ITCHING   Items with * indicate a potential emergency and should be followed up as soon as possible or go to the Emergency Department if any problems should occur.  Please show the CHEMOTHERAPY ALERT CARD or IMMUNOTHERAPY ALERT CARD at check-in to  the Emergency Department and triage nurse.  Should you have questions after your visit or need to cancel or reschedule your appointment, please contact [facility]  [phone] and follow the prompts.  Office hours are 8:00 a.m. to 4:30 p.m. Monday - Friday. Please note that voicemails left after 4:00 p.m. may not be returned until the following business day.  We are closed weekends and major holidays. You have access to a nurse at all times for urgent questions. Please call the main number to the clinic [phone] and follow the prompts.  For any non-urgent questions, you may also contact your provider using MyChart. We now offer e-Visits for anyone 28 and older to request care online for non-urgent symptoms. For details visit [email or web address].   Also download the MyChart app! Go to the app store, search "MyChart", open the app, select [facility], and log in with your MyChart username and password.

## 2022-11-04 NOTE — Telephone Encounter (Signed)
Opened in error

## 2022-11-04 NOTE — Progress Notes (Signed)
Nutrition Follow-up:  Patient with SCC of base of tongue.  Patient receiving concurrent chemotherapy and radiation.   Met with patient during infusion.  Decreased appetite, food is tasting bad and had issues with nausea.  Yesterday drank 1 1/2 ensure shakes, a honeybun, ritz crackers and 2 glasses of milk.  Can't remember if he ate anything else.  Today for lunch at the cancer center ate 1/2 grilled cheese sandwich, pudding, some jello.  Has bought some canned chicken to add to chicken noodle soup but has not had a "taste for soup."      Medications: olanzapine added  Labs: Na 132, glucose 115, phosphorus 3.3, Mag 1.9  Anthropometrics:   Weight 234 lb 6.4 oz today  238 lb 9.6 oz on 9/3 245 lb 6.4 oz on 8/26 250 lb on 4/25  4% weight loss in the last 3 weeks, significant  NUTRITION DIAGNOSIS: Inadequate oral intake ongoing    INTERVENTION:  Encouraged patient to increase ensure plus to at least BID, can drink 5-6 a day with limited solid food Encouraged intake q 2 hours Encouraged taking nausea medications.      MONITORING, EVALUATION, GOAL: weight trends, intake   NEXT VISIT: Thursday, Sept 19 during infusion  Joshoa Shawler B. Freida Busman, RD, LDN Registered Dietitian [phone]

## 2022-11-05 ENCOUNTER — Ambulatory Visit
Admission: RE | Admit: 2022-11-05 | Discharge: 2022-11-05 | Disposition: A | Discharge status: Home / Self Care | Payer: Medicare Other | Source: Ambulatory Visit | Attending: Radiation Oncology | Admitting: Radiation Oncology

## 2022-11-05 ENCOUNTER — Other Ambulatory Visit: Payer: Self-pay

## 2022-11-05 DIAGNOSIS — C01 Malignant neoplasm of base of tongue: Secondary | ICD-10-CM | POA: Diagnosis not present

## 2022-11-05 DIAGNOSIS — Z510 Encounter for antineoplastic radiation therapy: Secondary | ICD-10-CM | POA: Diagnosis not present

## 2022-11-05 DIAGNOSIS — C770 Secondary and unspecified malignant neoplasm of lymph nodes of head, face and neck: Secondary | ICD-10-CM | POA: Diagnosis not present

## 2022-11-05 DIAGNOSIS — Z5111 Encounter for antineoplastic chemotherapy: Secondary | ICD-10-CM | POA: Diagnosis not present

## 2022-11-05 DIAGNOSIS — K5900 Constipation, unspecified: Secondary | ICD-10-CM | POA: Diagnosis not present

## 2022-11-05 LAB — RAD ONC ARIA SESSION SUMMARY
Course Elapsed Days: 23
Plan Fractions Treated to Date: 17
Plan Prescribed Dose Per Fraction: 2 Gy
Plan Total Fractions Prescribed: 35
Plan Total Prescribed Dose: 70 Gy
Reference Point Dosage Given to Date: 34 Gy
Reference Point Session Dosage Given: 2 Gy
Session Number: 17

## 2022-11-07 ENCOUNTER — Encounter: Payer: Self-pay | Admitting: Emergency Medicine

## 2022-11-07 ENCOUNTER — Other Ambulatory Visit: Payer: Self-pay

## 2022-11-07 ENCOUNTER — Emergency Department
Admission: EM | Admit: 2022-11-07 | Discharge: 2022-11-07 | Disposition: A | Discharge status: Home / Self Care | Payer: Medicare Other | Attending: Emergency Medicine | Admitting: Emergency Medicine

## 2022-11-07 DIAGNOSIS — K5900 Constipation, unspecified: Secondary | ICD-10-CM | POA: Diagnosis not present

## 2022-11-07 DIAGNOSIS — K5903 Drug induced constipation: Secondary | ICD-10-CM | POA: Insufficient documentation

## 2022-11-07 DIAGNOSIS — K648 Other hemorrhoids: Secondary | ICD-10-CM | POA: Insufficient documentation

## 2022-11-07 MED ORDER — SENNOSIDES-DOCUSATE SODIUM 8.6-50 MG PO TABS: 1.0000 | ORAL_TABLET | Freq: Every day | ORAL | 0 refills | Status: DC

## 2022-11-07 MED ORDER — POLYETHYLENE GLYCOL 3350 17 G PO PACK: 17.0000 g | PACK | Freq: Every day | ORAL | 0 refills | Status: DC

## 2022-11-07 MED ORDER — MINERAL OIL RE ENEM
1.0000 | ENEMA | Freq: Once | RECTAL | Status: AC
  Administered 2022-11-07: 1 via RECTAL

## 2022-11-07 MED ORDER — FLEET ENEMA RE ENEM: 1.0000 | ENEMA | Freq: Once | RECTAL | Status: DC

## 2022-11-07 MED ORDER — MAGNESIUM CITRATE PO SOLN
1.0000 | Freq: Once | ORAL | Status: DC
  Filled 2022-11-07: qty 296

## 2022-11-07 MED ORDER — MAGNESIUM CITRATE PO SOLN
1.0000 | Freq: Once | ORAL | Status: AC
  Administered 2022-11-07: 1 via ORAL
  Filled 2022-11-07: qty 296

## 2022-11-07 NOTE — ED Notes (Signed)
Patient declined discharge vital signs. 

## 2022-11-07 NOTE — ED Triage Notes (Signed)
Pt via POV from home. Pt c/o constipation for the past week. Pt chronic pain medication, tramadol and stated that he was told it could cause constipation but has taken anything OTC. States that he tried a suppository approx 30 prior to arrival. Pt is A&Ox4 and NAD, ambulatory to triage.

## 2022-11-07 NOTE — ED Provider Notes (Signed)
[facility] Emergency Department Provider Note     Event Date/Time   First MD Initiated Contact with Patient 11/07/22 1855     (approximate)   History   Constipation   HPI  Cory Hess is a 77 y.o. male presents to the ED with complaint of constipation for the last week.  Reports last bowel movement was 7 days ago.  Patient is currently on chronic pain medication tramadol and just began chemo and radiation.  Patient reports he placed a suppository prior to arrival and has not been able to poop.  Denies fever and abdominal pain.  No rectal bleeding.  No other complaints at this time.     Physical Exam   Triage Vital Signs: ED Triage Vitals  Encounter Vitals Group     BP 11/07/22 1809 116/60     Systolic BP Percentile --      Diastolic BP Percentile --      Pulse Rate 11/07/22 1809 74     Resp 11/07/22 1809 18     Temp 11/07/22 1809 98.4 F (36.9 C)     Temp src --      SpO2 11/07/22 1809 97 %     Weight --      Height --      Head Circumference --      Peak Flow --      Pain Score 11/07/22 1812 10     Pain Loc --      Pain Education --      Exclude from Growth Chart --     Most recent vital signs: Vitals:   11/07/22 1809  BP: 116/60  Pulse: 74  Resp: 18  Temp: 98.4 F (36.9 C)  SpO2: 97%    General Awake, no distress.  Nontoxic HEENT NCAT. PERRL. EOMI.  CV:  Good peripheral perfusion. RRR RESP:  Normal effort. LCTAB ABD:  No distention.  Soft and nontender. Other:  Rectal exam performed. Internal hemorrhoids palpated.  Moderate amount of hard stool felt at rectal vault.     ED Results / Procedures / Treatments   Labs (all labs ordered are listed, but only abnormal results are displayed) Labs Reviewed - No data to display   No results found.  PROCEDURES:  Critical Care performed: No  Fecal disimpaction  Date/Time: 11/08/2022 12:20 AM  Performed by: Conrad [place], PA-C Authorized by: Conrad [place], PA-C   Consent: Verbal consent obtained. Risks and benefits: risks, benefits and alternatives were discussed Consent given by: patient Patient understanding: patient states understanding of the procedure being performed Patient consent: the patient's understanding of the procedure matches consent given Local anesthesia used: no  Anesthesia: Local anesthesia used: no  Sedation: Patient sedated: no  Patient tolerance: patient tolerated the procedure well with no immediate complications    MEDICATIONS ORDERED IN ED: Medications  mineral oil enema 1 enema (1 enema Rectal Given 11/07/22 2150)  magnesium citrate solution 1 Bottle (1 Bottle Oral Given 11/07/22 2246)    IMPRESSION / MDM / ASSESSMENT AND PLAN / ED COURSE  I reviewed the triage vital signs and the nursing notes.                                 77 y.o. male presents to the emergency department for evaluation and treatment of constipation. See HPI for further details.   Differential diagnosis includes, but is not limited  to constipation, fecal impaction, hemorrhoids  Patient's presentation is most consistent with acute complicated illness / injury requiring diagnostic workup.  Fecal impaction performed. Using a gloved finger with KY jelly, I manually disimpact moderated  amount of hard feculent material from the rectum following enema. Patient is in stable condition for discharge home.  He is encouraged to discontinue his tramadol until further follow-up with his primary care.  Patient is given a bottle of magnesium citrate to take at home to help empty out stool burden. Patient is given ED precautions to return to the ED for any worsening or new symptoms. Patient verbalizes understanding. All questions and concerns were addressed during ED visit.     FINAL CLINICAL IMPRESSION(S) / ED DIAGNOSES   Final diagnoses:  Constipation due to pain medication    Rx / DC Orders   ED Discharge Orders          Ordered    polyethylene  glycol (MIRALAX) 17 g packet  Daily,   Status:  Discontinued        11/07/22 2235    senna-docusate (SENOKOT-S) 8.6-50 MG tablet  Daily,   Status:  Discontinued        11/07/22 2235    polyethylene glycol (MIRALAX) 17 g packet  Daily        11/07/22 2235    senna-docusate (SENOKOT-S) 8.6-50 MG tablet  Daily        11/07/22 2235             Note:  This document was prepared using Dragon voice recognition software and may include unintentional dictation errors.    Romeo Apple, Airen Stiehl A, PA-C 11/08/22 Perlie Mayo    Corena Herter, MD 11/08/22 1301

## 2022-11-07 NOTE — Discharge Instructions (Addendum)
Pick up magnesium citrate from your local [facility].  Your evaluated in the ED for constipation.  We were able to achieve good amount of stool removal.  Follow-up with your primary care.  Discontinue tramadol until you can follow-up with your primary care.

## 2022-11-08 ENCOUNTER — Other Ambulatory Visit: Payer: Self-pay

## 2022-11-08 ENCOUNTER — Ambulatory Visit
Admission: RE | Admit: 2022-11-08 | Discharge: 2022-11-08 | Disposition: A | Discharge status: Home / Self Care | Payer: Medicare Other | Source: Ambulatory Visit | Attending: Radiation Oncology | Admitting: Radiation Oncology

## 2022-11-08 DIAGNOSIS — C770 Secondary and unspecified malignant neoplasm of lymph nodes of head, face and neck: Secondary | ICD-10-CM | POA: Diagnosis not present

## 2022-11-08 DIAGNOSIS — Z510 Encounter for antineoplastic radiation therapy: Secondary | ICD-10-CM | POA: Diagnosis not present

## 2022-11-08 DIAGNOSIS — C01 Malignant neoplasm of base of tongue: Secondary | ICD-10-CM | POA: Diagnosis not present

## 2022-11-08 DIAGNOSIS — K5900 Constipation, unspecified: Secondary | ICD-10-CM | POA: Diagnosis not present

## 2022-11-08 DIAGNOSIS — Z5111 Encounter for antineoplastic chemotherapy: Secondary | ICD-10-CM | POA: Diagnosis not present

## 2022-11-08 LAB — RAD ONC ARIA SESSION SUMMARY
Course Elapsed Days: 26
Plan Fractions Treated to Date: 18
Plan Prescribed Dose Per Fraction: 2 Gy
Plan Total Fractions Prescribed: 35
Plan Total Prescribed Dose: 70 Gy
Reference Point Dosage Given to Date: 36 Gy
Reference Point Session Dosage Given: 2 Gy
Session Number: 18

## 2022-11-09 ENCOUNTER — Other Ambulatory Visit: Payer: Self-pay

## 2022-11-09 ENCOUNTER — Ambulatory Visit
Admission: RE | Admit: 2022-11-09 | Discharge: 2022-11-09 | Disposition: A | Discharge status: Home / Self Care | Payer: Medicare Other | Source: Ambulatory Visit | Attending: Radiation Oncology | Admitting: Radiation Oncology

## 2022-11-09 DIAGNOSIS — C770 Secondary and unspecified malignant neoplasm of lymph nodes of head, face and neck: Secondary | ICD-10-CM | POA: Diagnosis not present

## 2022-11-09 DIAGNOSIS — K5900 Constipation, unspecified: Secondary | ICD-10-CM | POA: Diagnosis not present

## 2022-11-09 DIAGNOSIS — C01 Malignant neoplasm of base of tongue: Secondary | ICD-10-CM | POA: Diagnosis not present

## 2022-11-09 DIAGNOSIS — Z5111 Encounter for antineoplastic chemotherapy: Secondary | ICD-10-CM | POA: Diagnosis not present

## 2022-11-09 DIAGNOSIS — Z510 Encounter for antineoplastic radiation therapy: Secondary | ICD-10-CM | POA: Diagnosis not present

## 2022-11-09 LAB — RAD ONC ARIA SESSION SUMMARY
Course Elapsed Days: 27
Plan Fractions Treated to Date: 19
Plan Prescribed Dose Per Fraction: 2 Gy
Plan Total Fractions Prescribed: 35
Plan Total Prescribed Dose: 70 Gy
Reference Point Dosage Given to Date: 38 Gy
Reference Point Session Dosage Given: 2 Gy
Session Number: 19

## 2022-11-10 ENCOUNTER — Encounter: Payer: Self-pay | Admitting: Internal Medicine

## 2022-11-10 ENCOUNTER — Inpatient Hospital Stay (HOSPITAL_BASED_OUTPATIENT_CLINIC_OR_DEPARTMENT_OTHER): Payer: Medicare Other | Admitting: Internal Medicine

## 2022-11-10 ENCOUNTER — Other Ambulatory Visit: Payer: Self-pay

## 2022-11-10 ENCOUNTER — Inpatient Hospital Stay: Payer: Medicare Other

## 2022-11-10 ENCOUNTER — Ambulatory Visit
Admission: RE | Admit: 2022-11-10 | Discharge: 2022-11-10 | Disposition: A | Discharge status: Home / Self Care | Payer: Medicare Other | Source: Ambulatory Visit | Attending: Radiation Oncology | Admitting: Radiation Oncology

## 2022-11-10 VITALS — BP 110/54 | HR 64 | Temp 98.3°F | Resp 16 | Ht 72.0 in | Wt 230.0 lb

## 2022-11-10 DIAGNOSIS — C01 Malignant neoplasm of base of tongue: Secondary | ICD-10-CM

## 2022-11-10 DIAGNOSIS — K5900 Constipation, unspecified: Secondary | ICD-10-CM | POA: Diagnosis not present

## 2022-11-10 DIAGNOSIS — Z510 Encounter for antineoplastic radiation therapy: Secondary | ICD-10-CM | POA: Diagnosis not present

## 2022-11-10 DIAGNOSIS — C770 Secondary and unspecified malignant neoplasm of lymph nodes of head, face and neck: Secondary | ICD-10-CM | POA: Diagnosis not present

## 2022-11-10 DIAGNOSIS — Z5111 Encounter for antineoplastic chemotherapy: Secondary | ICD-10-CM | POA: Diagnosis not present

## 2022-11-10 LAB — CBC WITH DIFFERENTIAL (CANCER CENTER ONLY)
Abs Immature Granulocytes: 0.02 10*3/uL (ref 0.00–0.07)
Basophils Absolute: 0 10*3/uL (ref 0.0–0.1)
Basophils Relative: 0 %
Eosinophils Absolute: 0 10*3/uL (ref 0.0–0.5)
Eosinophils Relative: 0 %
HCT: 31.4 % — ABNORMAL LOW (ref 39.0–52.0)
Hemoglobin: 10.5 g/dL — ABNORMAL LOW (ref 13.0–17.0)
Immature Granulocytes: 0 %
Lymphocytes Relative: 11 %
Lymphs Abs: 0.5 10*3/uL — ABNORMAL LOW (ref 0.7–4.0)
MCH: 31.5 pg (ref 26.0–34.0)
MCHC: 33.4 g/dL (ref 30.0–36.0)
MCV: 94.3 fL (ref 80.0–100.0)
Monocytes Absolute: 0.6 10*3/uL (ref 0.1–1.0)
Monocytes Relative: 12 %
Neutro Abs: 3.8 10*3/uL (ref 1.7–7.7)
Neutrophils Relative %: 77 %
Platelet Count: 126 10*3/uL — ABNORMAL LOW (ref 150–400)
RBC: 3.33 MIL/uL — ABNORMAL LOW (ref 4.22–5.81)
RDW: 12.9 % (ref 11.5–15.5)
WBC Count: 5 10*3/uL (ref 4.0–10.5)
nRBC: 0 % (ref 0.0–0.2)

## 2022-11-10 LAB — RAD ONC ARIA SESSION SUMMARY
Course Elapsed Days: 28
Plan Fractions Treated to Date: 20
Plan Prescribed Dose Per Fraction: 2 Gy
Plan Total Fractions Prescribed: 35
Plan Total Prescribed Dose: 70 Gy
Reference Point Dosage Given to Date: 40 Gy
Reference Point Session Dosage Given: 2 Gy
Session Number: 20

## 2022-11-10 LAB — BASIC METABOLIC PANEL - CANCER CENTER ONLY
Anion gap: 4 — ABNORMAL LOW (ref 5–15)
BUN: 30 mg/dL — ABNORMAL HIGH (ref 8–23)
CO2: 26 mmol/L (ref 22–32)
Calcium: 8.4 mg/dL — ABNORMAL LOW (ref 8.9–10.3)
Chloride: 103 mmol/L (ref 98–111)
Creatinine: 1.2 mg/dL (ref 0.61–1.24)
GFR, Estimated: >60 mL/min (ref >60)
Glucose, Bld: 119 mg/dL — ABNORMAL HIGH (ref 70–99)
Potassium: 4.3 mmol/L (ref 3.5–5.1)
Sodium: 133 mmol/L — ABNORMAL LOW (ref 135–145)

## 2022-11-10 LAB — PHOSPHORUS: Phosphorus: 3.2 mg/dL (ref 2.5–4.6)

## 2022-11-10 LAB — MAGNESIUM: Magnesium: 2 mg/dL (ref 1.7–2.4)

## 2022-11-10 MED FILL — Fosaprepitant Dimeglumine For IV Infusion 150 MG (Base Eq): INTRAVENOUS | Qty: 5 | Status: AC

## 2022-11-10 MED FILL — Dexamethasone Sodium Phosphate Inj 100 MG/10ML: INTRAMUSCULAR | Qty: 1 | Status: AC

## 2022-11-10 NOTE — Progress Notes (Signed)
Patient had to go to the ED on Sunday for constipation. Had not had a BM in over a week. Patient states it was due to Tramadol. Would like to try something else in place of it. Bowels are now about back to normal. Does have some nausea but has been taking antimetics regularly and it has helped keep nausea under control.

## 2022-11-10 NOTE — Assessment & Plan Note (Addendum)
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 JULY 31st, 2024-  Left tongue base primary with ipsilateral level 2 nodal metastasis. A right-sided diminutive level 2 node is mildly hypermetabolic and suspicious for contralateral nodal metastasis.   No extracervical metastatic disease identified.  cisplatin weekly x6-8; with concurrent RT [last oct 9th, 2024]  # Proceed with cisplatin- weekly cisplatin #4/ of planned 6-8 cycles. Labs-CBC/chemistries were reviewed with the patient.  # Constipation: improved on Miralax/colace.   # Nausea- sec to cisplatin- G-2 continue anti-emetics-; continue  zyprexa.   # Ear ache/ neck pain- Insomnia- sec to left neck LN/ RT-  stable; continue current dose of tramadol.  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 Discussed regarding low calcium.continue calcium 1200 plus vitamin D-3 1000-over-the-counter-2 pill a day.   SEP 2024- vitamin D levels-p.   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 Stable.   # Gait instability- sec to BPPV- walks with cane/ monitor for neuropathy-Stable.     # Baseline weight:[240-250- July-AUG, 2024]s/p evaluation with  [facility]- Stable.   # Hypermetabolic focus within the prostate in the setting of prostatomegaly.-PSA level- 1- WNL [Aug 2024. ]  # IV access: s/p port placement-- functional-   # Social support: poor social support- counseled   # DISPOSITION: As per IS-  Chemo tomorrow-  # follow up in 1 week-MD-; labs- cbc/cmp;mag;  phos; D-2 Cisplatin # follow up in 2 weeksMD-; labs- cbc/cmp;mag; phos; D-2 Cisplatin-Dr.B

## 2022-11-10 NOTE — Progress Notes (Signed)
[facility] CONSULT NOTE  Patient Care Team: Patient, No Pcp Per as PCP - General (General Practice) End, Cristal Deer, MD as PCP - Cardiology (Cardiology) Carmina Miller, MD as Consulting Physician (Radiation Oncology) Earna Coder, MD as Consulting Physician (Oncology)  CHIEF COMPLAINTS/PURPOSE OF CONSULTATION: head and neck cancer  #  Oncology History Overview Note  JULY, 2024- CT scan:  1. 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 2. Metastatic lymphadenopathy at the level 2 station on the left measuring 2.7 x 2.5 x 2.1 cm. 3. Atherosclerotic calcification of the carotid bifurcations.  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   Alone.   Cory Hess 77 y.o.  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  Patient had to go to the ED on Sunday for constipation. Currently on Miralax; and colace.   Does have some nausea but has been taking antimetics regularly and it has helped keep nausea under control.   Pt has pain in his left lymph node in neck area; the pain goes into his ear and temple which creates headaches. Pain now is a 3/10. At worst it is a 10/10.  Currently OFF  tramadol sec to constipation.   Review of Systems  Constitutional:  Positive for malaise/fatigue and weight loss.  Negative for chills, diaphoresis and fever.  HENT:  Negative for nosebleeds and sore throat.   Eyes:  Negative for double vision.  Respiratory:  Negative for cough, hemoptysis, sputum production, shortness of breath and wheezing.   Cardiovascular:  Negative for chest pain, palpitations, orthopnea and leg swelling.  Gastrointestinal:  Positive for nausea. Negative for abdominal pain, blood in stool, constipation, diarrhea, heartburn, melena and vomiting.  Genitourinary:  Negative for dysuria, frequency and urgency.  Musculoskeletal:  Positive for back pain and joint pain.  Skin: Negative.  Negative for itching and rash.  Neurological:  Negative for dizziness, tingling, focal weakness, weakness and headaches.  Endo/Heme/Allergies:  Does not bruise/bleed easily.  Psychiatric/Behavioral:  Negative for depression. The patient is not nervous/anxious and does not have insomnia.      MEDICAL HISTORY:  Past Medical History:  Diagnosis Date   Alternating RBBB & LBBB    Ankle swelling 2024   Arthritis    neck   Bladder tumor    CAD (coronary artery disease)    a. 08/2017 STEMI/Cath: LM 80ost/d, LAD 100p, LCX 95 ost/p, RCA 60p, 31m, RPDA 80ost; b. 08/2017 CABG x 3: LIMA->LAD, VG->OM, VG->PDA.   Cancer [facility])    bladder tumor   Cancer of base of tongue (HCC)    Essential hypertension    HFrEF (heart failure with reduced ejection fraction) (HCC)    a. 08/2017 TEE: EF 30-35%, sept/ant/inf HK, apical AK. 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 12/2017 Echo: EF 45-50%, diff HK. Gr1 DD. Mildly reduced  RV fxn. Mild RAE.   Hyperlipidemia LDL goal <70    Ischemic cardiomyopathy    a. 08/2017 TEE: EF 30-35%; b. 12/2017 Echo: EF 45-50%.   Myocardial infarction (HCC) 08/27/2017   Shingles    2018 - right side   Vertigo    several months ago    SURGICAL HISTORY: Past Surgical History:  Procedure Laterality Date   CARDIAC CATHETERIZATION     CATARACT EXTRACTION W/PHACO Left 07/18/2018   Procedure: CATARACT  EXTRACTION PHACO AND INTRAOCULAR LENS PLACEMENT (IOC)  LEFT;  Surgeon: Nevada Crane, MD;  Location: [facility];  Service: Ophthalmology;  Laterality: Left;   CATARACT EXTRACTION W/PHACO Right 08/14/2018   Procedure: CATARACT EXTRACTION PHACO AND INTRAOCULAR LENS PLACEMENT (IOC)  RIGHT;  Surgeon: Nevada Crane, MD;  Location: [facility];  Service: Ophthalmology;  Laterality: Right;   CORONARY ARTERY BYPASS GRAFT N/A 08/27/2017   Procedure: CORONARY ARTERY BYPASS GRAFTING (CABG) ON PUMP USING LEFT INTERNAL MAMMARY ARTERY AND LEFT GREATER SAPHENOUS VEIN VIA ENDOVEIN HARVEST;  Surgeon: Alleen Borne, MD;  Location: [facility] OR;  Service: Open Heart Surgery;  Laterality: N/A;   CORONARY/GRAFT ACUTE MI REVASCULARIZATION N/A 08/27/2017   Procedure: Coronary/Graft Acute MI Revascularization;  Surgeon: Iran Ouch, MD;  Location: [facility] INVASIVE CV LAB;  Service: Cardiovascular;  Laterality: N/A;   CYSTOSCOPY W/ RETROGRADES Bilateral 12/25/2018   Procedure: CYSTOSCOPY WITH RETROGRADE PYELOGRAM;  Surgeon: Vanna Scotland, MD;  Location: [facility] ORS;  Service: Urology;  Laterality: Bilateral;   FRACTURE SURGERY Right 1958   arm and left wrist compound fracture, no metal   HERNIA REPAIR Right    inguinial   IR IMAGING GUIDED PORT INSERTION  10/15/2022   LEFT HEART CATH AND CORONARY ANGIOGRAPHY N/A 08/27/2017   Procedure: LEFT HEART CATH AND CORONARY ANGIOGRAPHY;  Surgeon: Iran Ouch, MD;  Location: [facility] INVASIVE CV LAB;  Service: Cardiovascular;  Laterality: N/A;   TRANSURETHRAL RESECTION OF BLADDER TUMOR WITH MITOMYCIN-C N/A 12/25/2018   Procedure: TRANSURETHRAL RESECTION OF BLADDER TUMOR WITH Gemcitabine;  Surgeon: Vanna Scotland, MD;  Location: [facility] ORS;  Service: Urology;  Laterality: N/A;    SOCIAL HISTORY: Social History   Socioeconomic History   Marital status: Single    Spouse name: Not on file   Number of children: Not on file   Years of education: Not on file    Highest education level: Not on file  Occupational History   Occupation: supervision, Research scientist (life sciences)    Comment: retired  Tobacco Use   Smoking status: Former    Current packs/day: 0.00    Types: Cigarettes    Quit date: 2000    Years since quitting: 24.7   Smokeless tobacco: Current    Types: Snuff  Vaping Use   Vaping status: Never Used  Substance and Sexual Activity   Alcohol use: Not Currently   Drug use: Not Currently   Sexual activity: Not on file  Other Topics Concern   Not on file  Social History Narrative   Lives alone   Social Determinants of Health   Financial Resource Strain: Low Risk  (11/06/2018)   Overall Financial Resource Strain (CARDIA)    Difficulty of Paying Living Expenses: Not hard at all  Food Insecurity: No Food Insecurity (09/10/2022)   Hunger Vital Sign    Worried About Running Out of Food in the Last Year: Never true    Ran Out of Food in the Last Year: Never true  Transportation Needs: Not on file (  09/07/2022)  Physical Activity: Inactive (11/06/2018)   Exercise Vital Sign    Days of Exercise per Week: 0 days    Minutes of Exercise per Session: 0 min  Stress: No Stress Concern Present (11/06/2018)   [employer] of Occupational Health - Occupational Stress Questionnaire    Feeling of Stress : Not at all  Social Connections: Socially Isolated (11/06/2018)   Social Connection and Isolation Panel [NHANES]    Frequency of Communication with Friends and Family: Once a week    Frequency of Social Gatherings with Friends and Family: Once a week    Attends Religious Services: Never    Database administrator or Organizations: No    Attends Banker Meetings: Never    Marital Status: Never married  Intimate Partner Violence: Not At Risk (09/10/2022)   Humiliation, Afraid, Rape, and Kick questionnaire    Fear of Current or Ex-Partner: No    Emotionally Abused: No    Physically Abused: No    Sexually Abused: No    FAMILY  HISTORY: Family History  Problem Relation Age of Onset   Heart failure Mother    Lung disease Father    Stroke Father    Cervical cancer Maternal Grandmother     ALLERGIES:  has No Known Allergies.  MEDICATIONS:  Current Outpatient Medications  Medication Sig Dispense Refill   acetaminophen (TYLENOL) 650 MG CR tablet Take 650 mg by mouth every 8 (eight) hours as needed for pain.     aspirin EC 81 MG EC tablet Take 1 tablet (81 mg total) by mouth daily.     atorvastatin (LIPITOR) 80 MG tablet Take 1 tablet by mouth once daily 90 tablet 0   carvedilol (COREG) 12.5 MG tablet Take 1 tablet (12.5 mg total) by mouth 2 (two) times daily with a meal. PLEASE CALL OFFICE TO SCHEDULE APPOINTMENT PRIOR TO NEXT REFILL 180 tablet 0   lidocaine-prilocaine (EMLA) cream Apply on the port. 30 -45 min  prior to port access. 30 g 3   losartan (COZAAR) 100 MG tablet Take 1 tablet (100 mg total) by mouth daily. PLEASE CALL OFFICE TO SCHEDULE APPOINTMENT PRIOR TO NEXT REFILL 90 tablet 0   Multiple Vitamin (MULTIVITAMIN WITH MINERALS) TABS tablet Take 1 tablet by mouth daily. One a Day over 50/ lunch     nitroGLYCERIN (NITROSTAT) 0.4 MG SL tablet DISSOLVE ONE TABLET UNDER THE TONGUE EVERY 5 MINUTES AS NEEDED FOR CHEST PAIN.  DO NOT EXCEED A TOTAL OF 3 DOSES IN 15 MINUTES 25 tablet 0   OLANZapine (ZYPREXA) 5 MG tablet Take one pill at night. 30 tablet 0   ondansetron (ZOFRAN) 8 MG tablet One pill every 8 hours as needed for nausea/vomitting. 40 tablet 1   polyethylene glycol (MIRALAX) 17 g packet Take 17 g by mouth daily. 30 each 0   potassium chloride (KLOR-CON) 10 MEQ tablet Take 1 tablet (10 mEq total) by mouth daily. 90 tablet 3   prochlorperazine (COMPAZINE) 10 MG tablet Take 1 tablet (10 mg total) by mouth every 6 (six) hours as needed for nausea or vomiting. 40 tablet 1   senna-docusate (SENOKOT-S) 8.6-50 MG tablet Take 1 tablet by mouth daily. 30 tablet 0   sucralfate (CARAFATE) 1 g tablet Take 1 tablet  (1 g total) by mouth 4 (four) times daily -  with meals and at bedtime. Dissolve in 4 tablespoons warm water 30 minutes before meals. 90 tablet 1   torsemide (DEMADEX) 20 MG tablet Take  1 tablet (20 mg total) by mouth 2 (two) times daily. 180 tablet 3   traMADol HCl 100 MG TABS Take 100 mg by mouth every 8 (eight) hours as needed. (Patient not taking: Reported on 11/10/2022) 30 tablet 0   No current facility-administered medications for this visit.   Facility-Administered Medications Ordered in Other Visits  Medication Dose Route Frequency Provider Last Rate Last Admin   sodium chloride flush (NS) 0.9 % injection 10 mL  10 mL Intravenous PRN Earna Coder, MD         PHYSICAL EXAMINATION:   Vitals:   11/10/22 1405  BP: (!) 110/54  Pulse: 64  Resp: 16  Temp: 98.3 F (36.8 C)  SpO2: 99%      Filed Weights   11/10/22 1405  Weight: 230 lb (104.3 kg)     Left neck 2 cm submandibular LN present.   Physical Exam Vitals and nursing note reviewed.  HENT:     Head: Normocephalic and atraumatic.     Mouth/Throat:     Pharynx: Oropharynx is clear.  Eyes:     Extraocular Movements: Extraocular movements intact.     Pupils: Pupils are equal, round, and reactive to light.  Cardiovascular:     Rate and Rhythm: Normal rate and regular rhythm.  Pulmonary:     Comments: Decreased breath sounds bilaterally.  Abdominal:     Palpations: Abdomen is soft.  Musculoskeletal:        General: Normal range of motion.     Cervical back: Normal range of motion.  Skin:    General: Skin is warm.  Neurological:     General: No focal deficit present.     Mental Status: He is alert and oriented to person, place, and time.  Psychiatric:        Behavior: Behavior normal.        Judgment: Judgment normal.      LABORATORY DATA:  I have reviewed the data as listed Lab Results  Component Value Date   WBC 5.0 11/10/2022   HGB 10.5 (L) 11/10/2022   HCT 31.4 (L) 11/10/2022   MCV  94.3 11/10/2022   PLT 126 (L) 11/10/2022   Recent Labs    08/25/22 0947 10/05/22 1629 10/26/22 0906 11/03/22 1309 11/10/22 1412  NA 139   < > 134* 132* 133*  K 4.1   < > 3.6 4.1 4.3  CL 111   < > 103 101 103  CO2 23   < > 25 26 26   GLUCOSE 110*   < > 128* 115* 119*  BUN 19   < > 17 19 30*  CREATININE 1.41*   < > 1.05 1.08 1.20  CALCIUM 8.9   < > 8.5* 8.6* 8.4*  GFRNONAA 52*   < > >60 >60 >60  PROT 7.1  --   --  6.6  --   ALBUMIN 4.0  --   --  3.7  --   AST 26  --   --  20  --   ALT 19  --   --  21  --   ALKPHOS 53  --   --  60  --   BILITOT 0.9  --   --  0.7  --    < > = values in this interval not displayed.    RADIOGRAPHIC STUDIES: I have personally reviewed the radiological images as listed and agreed with the findings in the report. IR IMAGING GUIDED PORT INSERTION  Result  Date: 10/15/2022 INDICATION: Chemotherapy EXAM: Chest port placement using ultrasound and fluoroscopic guidance MEDICATIONS: Documented in the EMR ANESTHESIA/SEDATION: Moderate (conscious) sedation was employed during this procedure. A total of Versed 1.5 mg and Fentanyl 100 mcg was administered intravenously. Moderate Sedation Time: 25 minutes. The patient's level of consciousness and vital signs were monitored continuously by radiology nursing throughout the procedure under my direct supervision. FLUOROSCOPY TIME:  Fluoroscopy Time: 0.5 minutes (6 mGy) COMPLICATIONS: None immediate. PROCEDURE: Informed written consent was obtained from the patient after a thorough discussion of the procedural risks, benefits and alternatives. All questions were addressed. Maximal Sterile Barrier Technique was utilized including caps, mask, sterile gowns, sterile gloves, sterile drape, hand hygiene and skin antiseptic. A timeout was performed prior to the initiation of the procedure. The patient was placed supine on the exam table. The right neck and chest was prepped and draped in the standard sterile fashion. A preliminary  ultrasound of the right neck was performed and demonstrates a patent right internal jugular vein. A permanent ultrasound image was stored in the electronic medical record. The overlying skin was anesthetized with 1% Lidocaine. Using ultrasound guidance, access was obtained into the right internal jugular vein using a 21 gauge micropuncture set. A wire was advanced into the SVC, a short incision was made at the puncture site, and serial dilatation performed. Next, in an ipsilateral infraclavicular location, an incision was made at the site of the subcutaneous reservoir. Blunt dissection was used to open a pocket to contain the reservoir. A subcutaneous tunnel was then created from the port site to the puncture site. A(n) 8 Fr single lumen catheter was advanced through the tunnel. The catheter was attached to the port and this was placed in the subcutaneous pocket. Under fluoroscopic guidance, a peel away sheath was placed, and the catheter was trimmed to the appropriate length and was advanced into the central veins. The catheter length is 23 cm. The tip of the catheter lies near the superior cavoatrial junction. The port flushes and aspirates appropriately. The port was flushed and locked with heparinized saline. The port pocket was closed in 2 layers using 3-0 and 4-0 Vicryl/absorbable suture. Dermabond was also applied to both incisions. The patient tolerated the procedure well and was transferred to recovery in stable condition. IMPRESSION: Successful right-sided chest port placement via the right internal jugular vein. The port is ready for immediate use. Electronically Signed   By: Olive Bass M.D.   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 JULY 31st, 2024-  Left tongue base primary with ipsilateral level 2 nodal metastasis. A right-sided diminutive level 2 node is mildly hypermetabolic and suspicious for contralateral nodal metastasis.   No extracervical metastatic disease identified.  cisplatin weekly x6-8; with concurrent RT [last oct 9th, 2024]  # Proceed with cisplatin- weekly cisplatin #4/ of planned 6-8 cycles. Labs-CBC/chemistries were reviewed with the patient.  # Constipation: improved on Miralax/colace.   # Nausea- sec to cisplatin- G-2 continue anti-emetics-; continue  zyprexa.   # Ear ache/ neck pain- Insomnia- sec to left neck LN/ RT-  stable; continue current dose of tramadol.  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 Discussed regarding low calcium.continue calcium 1200 plus vitamin D-3  1000-over-the-counter-2 pill a day.   SEP 2024- vitamin D levels-p.   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 Stable.   # Gait instability- sec to BPPV- walks with cane/ monitor for neuropathy-Stable.     # Baseline weight:[240-250- July-AUG, 2024]s/p evaluation with  [facility] nutrition- Stable.   # Hypermetabolic focus within the prostate in the setting of prostatomegaly.-PSA level- 1- WNL [Aug 2024. ]  # IV access: s/p port placement-- functional-   # Social support: poor social support- counseled   # DISPOSITION: As per IS-  Chemo tomorrow-  # follow up in 1 week-MD-; labs- cbc/cmp;mag;  phos; D-2 Cisplatin # follow up in 2 weeksMD-; labs- cbc/cmp;mag; phos; D-2 Cisplatin-Dr.B   All questions were answered. The patient knows to call the clinic with any problems, questions or concerns.    Earna Coder, MD 11/10/2022 3:33 PM

## 2022-11-11 ENCOUNTER — Ambulatory Visit
Admission: RE | Admit: 2022-11-11 | Discharge: 2022-11-11 | Disposition: A | Discharge status: Home / Self Care | Payer: Medicare Other | Source: Ambulatory Visit | Attending: Radiation Oncology | Admitting: Radiation Oncology

## 2022-11-11 ENCOUNTER — Inpatient Hospital Stay: Payer: Medicare Other

## 2022-11-11 ENCOUNTER — Other Ambulatory Visit: Payer: Self-pay

## 2022-11-11 VITALS — BP 136/73 | HR 71 | Temp 96.7°F | Resp 18

## 2022-11-11 DIAGNOSIS — C01 Malignant neoplasm of base of tongue: Secondary | ICD-10-CM | POA: Diagnosis not present

## 2022-11-11 DIAGNOSIS — C770 Secondary and unspecified malignant neoplasm of lymph nodes of head, face and neck: Secondary | ICD-10-CM | POA: Diagnosis not present

## 2022-11-11 DIAGNOSIS — K5900 Constipation, unspecified: Secondary | ICD-10-CM | POA: Diagnosis not present

## 2022-11-11 DIAGNOSIS — Z5111 Encounter for antineoplastic chemotherapy: Secondary | ICD-10-CM | POA: Diagnosis not present

## 2022-11-11 DIAGNOSIS — Z510 Encounter for antineoplastic radiation therapy: Secondary | ICD-10-CM | POA: Diagnosis not present

## 2022-11-11 LAB — RAD ONC ARIA SESSION SUMMARY
Course Elapsed Days: 29
Plan Fractions Treated to Date: 21
Plan Prescribed Dose Per Fraction: 2 Gy
Plan Total Fractions Prescribed: 35
Plan Total Prescribed Dose: 70 Gy
Reference Point Dosage Given to Date: 42 Gy
Reference Point Session Dosage Given: 2 Gy
Session Number: 21

## 2022-11-11 MED ORDER — SODIUM CHLORIDE 0.9 % IV SOLN
150.0000 mg | Freq: Once | INTRAVENOUS | Status: AC
  Administered 2022-11-11: 150 mg via INTRAVENOUS
  Filled 2022-11-11: qty 150

## 2022-11-11 MED ORDER — SODIUM CHLORIDE 0.9 % IV SOLN
10.0000 mg | Freq: Once | INTRAVENOUS | Status: AC
  Administered 2022-11-11: 10 mg via INTRAVENOUS
  Filled 2022-11-11: qty 10

## 2022-11-11 MED ORDER — POTASSIUM CHLORIDE IN NACL 20-0.9 MEQ/L-% IV SOLN
Freq: Once | INTRAVENOUS | Status: AC
  Filled 2022-11-11: qty 1000

## 2022-11-11 MED ORDER — SODIUM CHLORIDE 0.9 % IV SOLN
40.0000 mg/m2 | Freq: Once | INTRAVENOUS | Status: AC
  Administered 2022-11-11: 100 mg via INTRAVENOUS
  Filled 2022-11-11: qty 100

## 2022-11-11 MED ORDER — SODIUM CHLORIDE 0.9% FLUSH
10.0000 mL | INTRAVENOUS | Status: DC | PRN
  Administered 2022-11-11: 10 mL
  Filled 2022-11-11: qty 10

## 2022-11-11 MED ORDER — SODIUM CHLORIDE 0.9 % IV SOLN
Freq: Once | INTRAVENOUS | Status: AC
  Filled 2022-11-11: qty 250

## 2022-11-11 MED ORDER — MAGNESIUM SULFATE 2 GM/50ML IV SOLN
2.0000 g | Freq: Once | INTRAVENOUS | Status: AC
  Administered 2022-11-11: 2 g via INTRAVENOUS
  Filled 2022-11-11: qty 50

## 2022-11-11 MED ORDER — HEPARIN SOD (PORK) LOCK FLUSH 100 UNIT/ML IV SOLN
500.0000 [IU] | Freq: Once | INTRAVENOUS | Status: AC | PRN
  Administered 2022-11-11: 500 [IU]
  Filled 2022-11-11: qty 5

## 2022-11-11 MED ORDER — PALONOSETRON HCL INJECTION 0.25 MG/5ML
0.2500 mg | Freq: Once | INTRAVENOUS | Status: AC
  Administered 2022-11-11: 0.25 mg via INTRAVENOUS
  Filled 2022-11-11: qty 5

## 2022-11-11 NOTE — Progress Notes (Signed)
Nutrition Follow-up:  Patient with SCC of base of tongue. Patient receiving concurrent chemotherapy and radiation.  Noted seen in ED for constipation  Met with patient during infusion. Appetite not that good.  Says that nausea has been better this past week.  Has been taking nausea medication.  Reports that he is taking stool softner and miralax.  Picked up another case of ensure complete strawberry.  Chocolate was leaving a funny aftertaste in his mouth.  Has been eating blueberry mini muffins or honeybuns.  Can't remember what else.      Medications: reviewed  Labs: Phosphorus 3.2, Mag 2.0, Na 133, glucose 119, Vit D 55.70  Anthropometrics:   230 lb today 234 lb on 9/12 138 lb on 9/3 245 lb on 8/26 150 lb on 4/25 6% weight loss in the last month, significant   NUTRITION DIAGNOSIS: Inadequate oral intake ongoing with treatment side effects  INTERVENTION:  Recommend patient increase ensure shake to TID. Thinks he can with the strawberry flavor Try dreamsicile/creamsicile ice cream Continue taking nausea medications Continue with bowel regimen. Encouraged hydration.    MONITORING, EVALUATION, GOAL: weight trends, intake   NEXT VISIT: Thursday, Oct 3rd during infusion  Arlow Spiers B. Freida Busman, RD, LDN Registered Dietitian [phone]

## 2022-11-11 NOTE — Patient Instructions (Signed)
[facility]  Discharge Instructions: Thank you for choosing [facility] to provide your oncology and hematology care.  If you have a lab appointment with the Cancer Center, please go directly to the Cancer Center and check in at the registration area.  Wear comfortable clothing and clothing appropriate for easy access to any Portacath or PICC line.   We strive to give you quality time with your provider. You may need to reschedule your appointment if you arrive late (15 or more minutes).  Arriving late affects you and other patients whose appointments are after yours.  Also, if you miss three or more appointments without notifying the office, you may be dismissed from the clinic at the provider's discretion.      For prescription refill requests, have your pharmacy contact our office and allow 72 hours for refills to be completed.    Today you received the following chemotherapy and/or immunotherapy agents- cisplatin      To help prevent nausea and vomiting after your treatment, we encourage you to take your nausea medication as directed.  BELOW ARE SYMPTOMS THAT SHOULD BE REPORTED IMMEDIATELY: *FEVER GREATER THAN 100.4 F (38 C) OR HIGHER *CHILLS OR SWEATING *NAUSEA AND VOMITING THAT IS NOT CONTROLLED WITH YOUR NAUSEA MEDICATION *UNUSUAL SHORTNESS OF BREATH *UNUSUAL BRUISING OR BLEEDING *URINARY PROBLEMS (pain or burning when urinating, or frequent urination) *BOWEL PROBLEMS (unusual diarrhea, constipation, pain near the anus) TENDERNESS IN MOUTH AND THROAT WITH OR WITHOUT PRESENCE OF ULCERS (sore throat, sores in mouth, or a toothache) UNUSUAL RASH, SWELLING OR PAIN  UNUSUAL VAGINAL DISCHARGE OR ITCHING   Items with * indicate a potential emergency and should be followed up as soon as possible or go to the Emergency Department if any problems should occur.  Please show the CHEMOTHERAPY ALERT CARD or IMMUNOTHERAPY ALERT CARD at check-in to  the Emergency Department and triage nurse.  Should you have questions after your visit or need to cancel or reschedule your appointment, please contact [facility]  [phone] and follow the prompts.  Office hours are 8:00 a.m. to 4:30 p.m. Monday - Friday. Please note that voicemails left after 4:00 p.m. may not be returned until the following business day.  We are closed weekends and major holidays. You have access to a nurse at all times for urgent questions. Please call the main number to the clinic [phone] and follow the prompts.  For any non-urgent questions, you may also contact your provider using MyChart. We now offer e-Visits for anyone 59 and older to request care online for non-urgent symptoms. For details visit [email or web address].   Also download the MyChart app! Go to the app store, search "MyChart", open the app, select [facility], and log in with your MyChart username and password.

## 2022-11-11 NOTE — Progress Notes (Signed)
Per MD, OK to resume post hydration with Cisplatin.

## 2022-11-12 ENCOUNTER — Ambulatory Visit
Admission: RE | Admit: 2022-11-12 | Discharge: 2022-11-12 | Disposition: A | Discharge status: Home / Self Care | Payer: Medicare Other | Source: Ambulatory Visit | Attending: Radiation Oncology | Admitting: Radiation Oncology

## 2022-11-12 ENCOUNTER — Other Ambulatory Visit: Payer: Self-pay | Admitting: *Deleted

## 2022-11-12 ENCOUNTER — Other Ambulatory Visit: Payer: Self-pay

## 2022-11-12 ENCOUNTER — Telehealth: Payer: Self-pay

## 2022-11-12 DIAGNOSIS — Z510 Encounter for antineoplastic radiation therapy: Secondary | ICD-10-CM | POA: Diagnosis not present

## 2022-11-12 DIAGNOSIS — C770 Secondary and unspecified malignant neoplasm of lymph nodes of head, face and neck: Secondary | ICD-10-CM | POA: Diagnosis not present

## 2022-11-12 DIAGNOSIS — C01 Malignant neoplasm of base of tongue: Secondary | ICD-10-CM | POA: Diagnosis not present

## 2022-11-12 DIAGNOSIS — Z5111 Encounter for antineoplastic chemotherapy: Secondary | ICD-10-CM | POA: Diagnosis not present

## 2022-11-12 DIAGNOSIS — K5900 Constipation, unspecified: Secondary | ICD-10-CM | POA: Diagnosis not present

## 2022-11-12 LAB — RAD ONC ARIA SESSION SUMMARY
Course Elapsed Days: 30
Plan Fractions Treated to Date: 22
Plan Prescribed Dose Per Fraction: 2 Gy
Plan Total Fractions Prescribed: 35
Plan Total Prescribed Dose: 70 Gy
Reference Point Dosage Given to Date: 44 Gy
Reference Point Session Dosage Given: 2 Gy
Session Number: 22

## 2022-11-12 NOTE — Telephone Encounter (Signed)
Put in patients refill requests for compazine and Zofran. Enrique Sack helped me and we sent it over to Lauren.

## 2022-11-15 ENCOUNTER — Other Ambulatory Visit: Payer: Self-pay

## 2022-11-15 ENCOUNTER — Ambulatory Visit
Admission: RE | Admit: 2022-11-15 | Discharge: 2022-11-15 | Disposition: A | Discharge status: Home / Self Care | Payer: Medicare Other | Source: Ambulatory Visit | Attending: Radiation Oncology | Admitting: Radiation Oncology

## 2022-11-15 DIAGNOSIS — C01 Malignant neoplasm of base of tongue: Secondary | ICD-10-CM | POA: Diagnosis not present

## 2022-11-15 DIAGNOSIS — K5900 Constipation, unspecified: Secondary | ICD-10-CM | POA: Diagnosis not present

## 2022-11-15 DIAGNOSIS — Z510 Encounter for antineoplastic radiation therapy: Secondary | ICD-10-CM | POA: Diagnosis not present

## 2022-11-15 DIAGNOSIS — C770 Secondary and unspecified malignant neoplasm of lymph nodes of head, face and neck: Secondary | ICD-10-CM | POA: Diagnosis not present

## 2022-11-15 DIAGNOSIS — Z5111 Encounter for antineoplastic chemotherapy: Secondary | ICD-10-CM | POA: Diagnosis not present

## 2022-11-15 LAB — RAD ONC ARIA SESSION SUMMARY
Course Elapsed Days: 33
Plan Fractions Treated to Date: 23
Plan Prescribed Dose Per Fraction: 2 Gy
Plan Total Fractions Prescribed: 35
Plan Total Prescribed Dose: 70 Gy
Reference Point Dosage Given to Date: 46 Gy
Reference Point Session Dosage Given: 2 Gy
Session Number: 23

## 2022-11-16 ENCOUNTER — Other Ambulatory Visit: Payer: Self-pay

## 2022-11-16 ENCOUNTER — Ambulatory Visit
Admission: RE | Admit: 2022-11-16 | Discharge: 2022-11-16 | Disposition: A | Discharge status: Home / Self Care | Payer: Medicare Other | Source: Ambulatory Visit | Attending: Radiation Oncology | Admitting: Radiation Oncology

## 2022-11-16 DIAGNOSIS — Z5111 Encounter for antineoplastic chemotherapy: Secondary | ICD-10-CM | POA: Diagnosis not present

## 2022-11-16 DIAGNOSIS — C01 Malignant neoplasm of base of tongue: Secondary | ICD-10-CM | POA: Diagnosis not present

## 2022-11-16 DIAGNOSIS — Z510 Encounter for antineoplastic radiation therapy: Secondary | ICD-10-CM | POA: Diagnosis not present

## 2022-11-16 DIAGNOSIS — K5900 Constipation, unspecified: Secondary | ICD-10-CM | POA: Diagnosis not present

## 2022-11-16 DIAGNOSIS — C770 Secondary and unspecified malignant neoplasm of lymph nodes of head, face and neck: Secondary | ICD-10-CM | POA: Diagnosis not present

## 2022-11-16 LAB — RAD ONC ARIA SESSION SUMMARY
Course Elapsed Days: 34
Plan Fractions Treated to Date: 24
Plan Prescribed Dose Per Fraction: 2 Gy
Plan Total Fractions Prescribed: 35
Plan Total Prescribed Dose: 70 Gy
Reference Point Dosage Given to Date: 48 Gy
Reference Point Session Dosage Given: 2 Gy
Session Number: 24

## 2022-11-17 ENCOUNTER — Encounter: Payer: Self-pay | Admitting: Internal Medicine

## 2022-11-17 ENCOUNTER — Inpatient Hospital Stay: Payer: Medicare Other

## 2022-11-17 ENCOUNTER — Inpatient Hospital Stay (HOSPITAL_BASED_OUTPATIENT_CLINIC_OR_DEPARTMENT_OTHER): Payer: Medicare Other | Admitting: Internal Medicine

## 2022-11-17 ENCOUNTER — Ambulatory Visit
Admission: RE | Admit: 2022-11-17 | Discharge: 2022-11-17 | Disposition: A | Discharge status: Home / Self Care | Payer: Medicare Other | Source: Ambulatory Visit | Attending: Radiation Oncology | Admitting: Radiation Oncology

## 2022-11-17 ENCOUNTER — Other Ambulatory Visit: Payer: Self-pay

## 2022-11-17 VITALS — BP 137/61 | HR 63 | Temp 97.9°F | Ht 72.0 in | Wt 226.8 lb

## 2022-11-17 DIAGNOSIS — C01 Malignant neoplasm of base of tongue: Secondary | ICD-10-CM | POA: Diagnosis not present

## 2022-11-17 DIAGNOSIS — Z510 Encounter for antineoplastic radiation therapy: Secondary | ICD-10-CM | POA: Diagnosis not present

## 2022-11-17 DIAGNOSIS — Z5111 Encounter for antineoplastic chemotherapy: Secondary | ICD-10-CM | POA: Diagnosis not present

## 2022-11-17 DIAGNOSIS — K5900 Constipation, unspecified: Secondary | ICD-10-CM | POA: Diagnosis not present

## 2022-11-17 DIAGNOSIS — C770 Secondary and unspecified malignant neoplasm of lymph nodes of head, face and neck: Secondary | ICD-10-CM | POA: Diagnosis not present

## 2022-11-17 LAB — CBC WITH DIFFERENTIAL (CANCER CENTER ONLY)
Abs Immature Granulocytes: 0.01 10*3/uL (ref 0.00–0.07)
Basophils Absolute: 0 10*3/uL (ref 0.0–0.1)
Basophils Relative: 0 %
Eosinophils Absolute: 0 10*3/uL (ref 0.0–0.5)
Eosinophils Relative: 0 %
HCT: 30.7 % — ABNORMAL LOW (ref 39.0–52.0)
Hemoglobin: 10.4 g/dL — ABNORMAL LOW (ref 13.0–17.0)
Immature Granulocytes: 0 %
Lymphocytes Relative: 9 %
Lymphs Abs: 0.3 10*3/uL — ABNORMAL LOW (ref 0.7–4.0)
MCH: 31.9 pg (ref 26.0–34.0)
MCHC: 33.9 g/dL (ref 30.0–36.0)
MCV: 94.2 fL (ref 80.0–100.0)
Monocytes Absolute: 0.5 10*3/uL (ref 0.1–1.0)
Monocytes Relative: 13 %
Neutro Abs: 2.9 10*3/uL (ref 1.7–7.7)
Neutrophils Relative %: 78 %
Platelet Count: 99 10*3/uL — ABNORMAL LOW (ref 150–400)
RBC: 3.26 MIL/uL — ABNORMAL LOW (ref 4.22–5.81)
RDW: 13.1 % (ref 11.5–15.5)
WBC Count: 3.7 10*3/uL — ABNORMAL LOW (ref 4.0–10.5)
nRBC: 0 % (ref 0.0–0.2)

## 2022-11-17 LAB — BASIC METABOLIC PANEL - CANCER CENTER ONLY
Anion gap: 5 (ref 5–15)
BUN: 28 mg/dL — ABNORMAL HIGH (ref 8–23)
CO2: 27 mmol/L (ref 22–32)
Calcium: 8.8 mg/dL — ABNORMAL LOW (ref 8.9–10.3)
Chloride: 103 mmol/L (ref 98–111)
Creatinine: 1.29 mg/dL — ABNORMAL HIGH (ref 0.61–1.24)
GFR, Estimated: 57 mL/min — ABNORMAL LOW (ref >60)
Glucose, Bld: 108 mg/dL — ABNORMAL HIGH (ref 70–99)
Potassium: 4.7 mmol/L (ref 3.5–5.1)
Sodium: 135 mmol/L (ref 135–145)

## 2022-11-17 LAB — RAD ONC ARIA SESSION SUMMARY
Course Elapsed Days: 35
Plan Fractions Treated to Date: 25
Plan Prescribed Dose Per Fraction: 2 Gy
Plan Total Fractions Prescribed: 35
Plan Total Prescribed Dose: 70 Gy
Reference Point Dosage Given to Date: 50 Gy
Reference Point Session Dosage Given: 2 Gy
Session Number: 25

## 2022-11-17 LAB — MAGNESIUM: Magnesium: 1.9 mg/dL (ref 1.7–2.4)

## 2022-11-17 LAB — PHOSPHORUS: Phosphorus: 3.5 mg/dL (ref 2.5–4.6)

## 2022-11-17 MED ORDER — SODIUM CHLORIDE 0.9 % IV SOLN
Freq: Once | INTRAVENOUS | Status: AC
  Filled 2022-11-17: qty 250

## 2022-11-17 MED ORDER — DEXAMETHASONE SODIUM PHOSPHATE 10 MG/ML IJ SOLN
8.0000 mg | Freq: Once | INTRAMUSCULAR | Status: AC
  Administered 2022-11-17: 8 mg via INTRAVENOUS
  Filled 2022-11-17: qty 1

## 2022-11-17 MED ORDER — HEPARIN SOD (PORK) LOCK FLUSH 100 UNIT/ML IV SOLN
500.0000 [IU] | Freq: Once | INTRAVENOUS | Status: AC | PRN
  Administered 2022-11-17: 500 [IU]
  Filled 2022-11-17: qty 5

## 2022-11-17 MED ORDER — SODIUM CHLORIDE 0.9 % IV SOLN: 8.0000 mg | Freq: Once | INTRAVENOUS | Status: DC

## 2022-11-17 MED FILL — Dexamethasone Sodium Phosphate Inj 100 MG/10ML: INTRAMUSCULAR | Qty: 1 | Status: AC

## 2022-11-17 MED FILL — Fosaprepitant Dimeglumine For IV Infusion 150 MG (Base Eq): INTRAVENOUS | Qty: 5 | Status: AC

## 2022-11-17 NOTE — Progress Notes (Signed)
[facility] CONSULT NOTE  Patient Care Team: Patient, No Pcp Per as PCP - General (General Practice) End, Cristal Deer, MD as PCP - Cardiology (Cardiology) Carmina Miller, MD as Consulting Physician (Radiation Oncology) Earna Coder, MD as Consulting Physician (Oncology)  CHIEF COMPLAINTS/PURPOSE OF CONSULTATION: head and neck cancer  #  Oncology History Overview Note  JULY, 2024- CT scan:  1. 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 2. Metastatic lymphadenopathy at the level 2 station on the left measuring 2.7 x 2.5 x 2.1 cm. 3. Atherosclerotic calcification of the carotid bifurcations.  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   Alone.   Cory Hess 77 y.o.  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  /o nausea and vomiting x1 week. Meds he's on not helping, would like something else.   Was seen in ED 11/07/22 for constipation from tramadol. Takes miralax and soft stool otc.   No appetite, drinks ensure 1-3 times a day, but they are starting to give him nausea  Patient had to go to the ED on Sunday for constipation. Currently on Miralax; and colace.   Does have some nausea but has been taking antimetics regularly and it has helped keep nausea under control.   Pt has pain in his left  lymph node in neck area; the pain goes into his ear and temple which creates headaches. Pain now is a 3/10. At worst it is a 10/10.  Currently OFF  tramadol sec to constipation.   Review of Systems  Constitutional:  Positive for malaise/fatigue and weight loss. Negative for chills, diaphoresis and fever.  HENT:  Negative for nosebleeds and sore throat.   Eyes:  Negative for double vision.  Respiratory:  Negative for cough, hemoptysis, sputum production, shortness of breath and wheezing.   Cardiovascular:  Negative for chest pain, palpitations, orthopnea and leg swelling.  Gastrointestinal:  Positive for nausea. Negative for abdominal pain, blood in stool, constipation, diarrhea, heartburn, melena and vomiting.  Genitourinary:  Negative for dysuria, frequency and urgency.  Musculoskeletal:  Positive for back pain and joint pain.  Skin: Negative.  Negative for itching and rash.  Neurological:  Negative for dizziness, tingling, focal weakness, weakness and headaches.  Endo/Heme/Allergies:  Does not bruise/bleed easily.  Psychiatric/Behavioral:  Negative for depression. The patient is not nervous/anxious and does not have insomnia.      MEDICAL HISTORY:  Past Medical History:  Diagnosis Date   Alternating RBBB & LBBB    Ankle swelling 2024   Arthritis    neck   Bladder tumor    CAD (coronary artery disease)    a. 08/2017 STEMI/Cath: LM 80ost/d, LAD 100p, LCX 95 ost/p, RCA 60p, 75m, RPDA 80ost; b. 08/2017 CABG x 3: LIMA->LAD, VG->OM, VG->PDA.   Cancer [facility])    bladder tumor  Cancer of base of tongue (HCC)    Essential hypertension    HFrEF (heart failure with reduced ejection fraction) (HCC)    a. 08/2017 TEE: EF 30-35%, sept/ant/inf HK, apical AK. 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 12/2017 Echo: EF 45-50%, diff HK. Gr1 DD. Mildly reduced RV fxn. Mild RAE.   Hyperlipidemia LDL goal <70    Ischemic cardiomyopathy    a. 08/2017 TEE: EF 30-35%; b. 12/2017 Echo: EF 45-50%.   Myocardial infarction  (HCC) 08/27/2017   Shingles    2018 - right side   Vertigo    several months ago    SURGICAL HISTORY: Past Surgical History:  Procedure Laterality Date   CARDIAC CATHETERIZATION     CATARACT EXTRACTION W/PHACO Left 07/18/2018   Procedure: CATARACT EXTRACTION PHACO AND INTRAOCULAR LENS PLACEMENT (IOC)  LEFT;  Surgeon: Nevada Crane, MD;  Location: [facility];  Service: Ophthalmology;  Laterality: Left;   CATARACT EXTRACTION W/PHACO Right 08/14/2018   Procedure: CATARACT EXTRACTION PHACO AND INTRAOCULAR LENS PLACEMENT (IOC)  RIGHT;  Surgeon: Nevada Crane, MD;  Location: [facility];  Service: Ophthalmology;  Laterality: Right;   CORONARY ARTERY BYPASS GRAFT N/A 08/27/2017   Procedure: CORONARY ARTERY BYPASS GRAFTING (CABG) ON PUMP USING LEFT INTERNAL MAMMARY ARTERY AND LEFT GREATER SAPHENOUS VEIN VIA ENDOVEIN HARVEST;  Surgeon: Alleen Borne, MD;  Location: [facility] OR;  Service: Open Heart Surgery;  Laterality: N/A;   CORONARY/GRAFT ACUTE MI REVASCULARIZATION N/A 08/27/2017   Procedure: Coronary/Graft Acute MI Revascularization;  Surgeon: Iran Ouch, MD;  Location: [facility] IN[facility];  Service: Cardiovascular;  Laterality: N/A;   CYSTOSCOPY W/ RETROGRADES Bilateral 12/25/2018   Procedure: CYSTOSCOPY WITH RETROGRADE PYELOGRAM;  Surgeon: Vanna Scotland, MD;  Location: [facility] ORS;  Service: Urology;  Laterality: Bilateral;   FRACTURE SURGERY Right 1958   arm and left wrist compound fracture, no metal   HERNIA REPAIR Right    inguinial   IR IMAGING GUIDED PORT INSERTION  10/15/2022   LEFT HEART CATH AND CORONARY ANGIOGRAPHY N/A 08/27/2017   Procedure: LEFT HEART CATH AND CORONARY ANGIOGRAPHY;  Surgeon: Iran Ouch, MD;  Location: [facility] INVAS[facility] CV LAB;  Service: Cardiovascular;  Laterality: N/A;   TRANSURETHRAL RESECTION OF BLADDER TUMOR WITH MITOMYCIN-C N/A 12/25/2018   Procedure: TRANSURETHRAL RESECTION OF BLADDER TUMOR WITH Gemcitabine;  Surgeon: Vanna Scotland,  MD;  Location: [facility] ORS;  Service: Urology;  Laterality: N/A;    SOCIAL HISTORY: Social History   Socioeconomic History   Marital status: Single    Spouse name: Not on file   Number of children: Not on file   Years of education: Not on file   Highest education level: Not on file  Occupational History   Occupation: supervision, Research scientist (life sciences)    Comment: retired  Tobacco Use   Smoking status: Former    Current packs/day: 0.00    Types: Cigarettes    Quit date: 2000    Years since quitting: 24.7   Smokeless tobacco: Current    Types: Snuff  Vaping Use   Vaping status: Never Used  Substance and Sexual Activity   Alcohol use: Not Currently   Drug use: Not Currently   Sexual activity: Not on file  Other Topics Concern   Not on file  Social History Narrative   Lives alone   Social Determinants of Health   Financial Resource Strain: Low Risk  (11/06/2018)   Overall Financial Resource Strain (CARDIA)    Difficulty of Paying Living Expenses:  Not hard at all  Food Insecurity: No Food Insecurity (09/10/2022)   Hunger Vital Sign    Worried About Running Out of Food in the Last Year: Never true    Ran Out of Food in the Last Year: Never true  Transportation Needs: Not on file (09/07/2022)  Physical Activity: Inactive (11/06/2018)   Exercise Vital Sign    Days of Exercise per Week: 0 days    Minutes of Exercise per Session: 0 min  Stress: No Stress Concern Present (11/06/2018)   [employer] of Occupational Health - Occupational Stress Questionnaire    Feeling of Stress : Not at all  Social Connections: Socially Isolated (11/06/2018)   Social Connection and Isolation Panel [NHANES]    Frequency of Communication with Friends and Family: Once a week    Frequency of Social Gatherings with Friends and Family: Once a week    Attends Religious Services: Never    Database administrator or Organizations: No    Attends Banker Meetings: Never    Marital  Status: Never married  Intimate Partner Violence: Not At Risk (09/10/2022)   Humiliation, Afraid, Rape, and Kick questionnaire    Fear of Current or Ex-Partner: No    Emotionally Abused: No    Physically Abused: No    Sexually Abused: No    FAMILY HISTORY: Family History  Problem Relation Age of Onset   Heart failure Mother    Lung disease Father    Stroke Father    Cervical cancer Maternal Grandmother     ALLERGIES:  has No Known Allergies.  MEDICATIONS:  Current Outpatient Medications  Medication Sig Dispense Refill   acetaminophen (TYLENOL) 650 MG CR tablet Take 650 mg by mouth every 8 (eight) hours as needed for pain.     aspirin EC 81 MG EC tablet Take 1 tablet (81 mg total) by mouth daily.     atorvastatin (LIPITOR) 80 MG tablet Take 1 tablet by mouth once daily 90 tablet 0   carvedilol (COREG) 12.5 MG tablet Take 1 tablet (12.5 mg total) by mouth 2 (two) times daily with a meal. PLEASE CALL OFFICE TO SCHEDULE APPOINTMENT PRIOR TO NEXT REFILL 180 tablet 0   lidocaine-prilocaine (EMLA) cream Apply on the port. 30 -45 min  prior to port access. 30 g 3   losartan (COZAAR) 100 MG tablet Take 1 tablet (100 mg total) by mouth daily. PLEASE CALL OFFICE TO SCHEDULE APPOINTMENT PRIOR TO NEXT REFILL 90 tablet 0   Multiple Vitamin (MULTIVITAMIN WITH MINERALS) TABS tablet Take 1 tablet by mouth daily. One a Day over 50/ lunch     nitroGLYCERIN (NITROSTAT) 0.4 MG SL tablet DISSOLVE ONE TABLET UNDER THE TONGUE EVERY 5 MINUTES AS NEEDED FOR CHEST PAIN.  DO NOT EXCEED A TOTAL OF 3 DOSES IN 15 MINUTES 25 tablet 0   OLANZapine (ZYPREXA) 5 MG tablet Take one pill at night. 30 tablet 0   ondansetron (ZOFRAN) 8 MG tablet One pill every 8 hours as needed for nausea/vomitting. 40 tablet 1   polyethylene glycol (MIRALAX) 17 g packet Take 17 g by mouth daily. 30 each 0   potassium chloride (KLOR-CON) 10 MEQ tablet Take 1 tablet (10 mEq total) by mouth daily. 90 tablet 3   prochlorperazine (COMPAZINE)  10 MG tablet Take 1 tablet (10 mg total) by mouth every 6 (six) hours as needed for nausea or vomiting. 40 tablet 1   senna-docusate (SENOKOT-S) 8.6-50 MG tablet Take 1 tablet by mouth daily.  30 tablet 0   sucralfate (CARAFATE) 1 g tablet Take 1 tablet (1 g total) by mouth 4 (four) times daily -  with meals and at bedtime. Dissolve in 4 tablespoons warm water 30 minutes before meals. 90 tablet 1   torsemide (DEMADEX) 20 MG tablet Take 1 tablet (20 mg total) by mouth 2 (two) times daily. 180 tablet 3   traMADol HCl 100 MG TABS Take 100 mg by mouth every 8 (eight) hours as needed. 30 tablet 0   No current facility-administered medications for this visit.   Facility-Administered Medications Ordered in Other Visits  Medication Dose Route Frequency Provider Last Rate Last Admin   dexamethasone (DECADRON) injection 8 mg  8 mg Intravenous Once Louretta Shorten R, MD       heparin lock flush 100 unit/mL  500 Units Intracatheter Once PRN Earna Coder, MD       sodium chloride flush (NS) 0.9 % injection 10 mL  10 mL Intravenous PRN Earna Coder, MD         PHYSICAL EXAMINATION:   Vitals:   11/17/22 1328  BP: 137/61  Pulse: 63  Temp: 97.9 F (36.6 C)  SpO2: 99%       Filed Weights   11/17/22 1328  Weight: 226 lb 12.8 oz (102.9 kg)      Left neck 2 cm submandibular LN present.   Physical Exam Vitals and nursing note reviewed.  HENT:     Head: Normocephalic and atraumatic.     Mouth/Throat:     Pharynx: Oropharynx is clear.  Eyes:     Extraocular Movements: Extraocular movements intact.     Pupils: Pupils are equal, round, and reactive to light.  Cardiovascular:     Rate and Rhythm: Normal rate and regular rhythm.  Pulmonary:     Comments: Decreased breath sounds bilaterally.  Abdominal:     Palpations: Abdomen is soft.  Musculoskeletal:        General: Normal range of motion.     Cervical back: Normal range of motion.  Skin:    General: Skin is  warm.  Neurological:     General: No focal deficit present.     Mental Status: He is alert and oriented to person, place, and time.  Psychiatric:        Behavior: Behavior normal.        Judgment: Judgment normal.      LABORATORY DATA:  I have reviewed the data as listed Lab Results  Component Value Date   WBC 3.7 (L) 11/17/2022   HGB 10.4 (L) 11/17/2022   HCT 30.7 (L) 11/17/2022   MCV 94.2 11/17/2022   PLT 99 (L) 11/17/2022   Recent Labs    08/25/22 0947 10/05/22 1629 11/03/22 1309 11/10/22 1412 11/17/22 1333  NA 139   < > 132* 133* 135  K 4.1   < > 4.1 4.3 4.7  CL 111   < > 101 103 103  CO2 23   < > 26 26 27   GLUCOSE 110*   < > 115* 119* 108*  BUN 19   < > 19 30* 28*  CREATININE 1.41*   < > 1.08 1.20 1.29*  CALCIUM 8.9   < > 8.6* 8.4* 8.8*  GFRNONAA 52*   < > >60 >60 57*  PROT 7.1  --  6.6  --   --   ALBUMIN 4.0  --  3.7  --   --   AST 26  --  20  --   --   ALT 19  --  21  --   --   ALKPHOS 53  --  60  --   --   BILITOT 0.9  --  0.7  --   --    < > = values in this interval not displayed.    RADIOGRAPHIC STUDIES: I have personally reviewed the radiological images as listed and agreed with the findings in the report. No results found.  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 JULY 31st, 2024-  Left tongue base primary with ipsilateral level 2 nodal metastasis. A right-sided diminutive level 2 node is mildly hypermetabolic and suspicious for contralateral nodal metastasis.   No extracervical metastatic disease identified.  cisplatin weekly x6-8; with concurrent RT [last oct 9th, 2024]  # HOLD cisplatin- weekly cisplatin #5  of planned 6-8 cycles- given significant nausea/ slight drop in renal function.  Labs-CBC/chemistries were reviewed with the patient.  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 Discussed regarding low calcium.continue calcium 1200 plus vitamin D-3 1000-over-the-counter-2 pill a day.   SEP 2024- vitamin D levels-p.  # Constipation: improved on Miralax/colace.   # Nausea- sec to cisplatin- G-2 continue anti-emetics [zofran/compazine]-; reminded to take zyprexa.   # Ear ache/ neck pain- Insomnia- sec to left neck LN/ RT-  stable; continue current dose of tramadol.   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 Stable.   # Gait instability- sec to BPPV- walks with cane/ monitor for neuropathy-Stable.     # Baseline weight:[240-250- July-AUG, 2024]s/p evaluation with  [facility] nutrition- Stable.   # Hypermetabolic focus within the prostate in the setting of prostatomegaly.-PSA level- 1- WNL [Aug 2024. ]  # IV access: s/p port placement-- functional-   # Social support: poor social support- counseled   # DISPOSITION: # IVFs 1 lit today+ Dex 8 mg IV today # HOLD Chemo tomorrow-  # IVFs 1 lit today+ Dex 8 mg IV tomorrow # follow up in 1 week-MD-; labs- cbc/cmp;mag;  phos; D-2 Cisplatin; D-3 and D-4- 1 lit IVFs over 1 hour # follow up in 2 weeksMD-; labs- cbc/cmp;mag; phos; D-2 Cisplatin; D-3 and D-4- 1 lit IVFs over 1 hour-Dr.B  All questions were answered. The patient knows to call the clinic with any problems, questions or concerns.    Earna Coder, MD 11/17/2022 2:48 PM

## 2022-11-17 NOTE — Assessment & Plan Note (Addendum)
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 JULY 31st, 2024-  Left tongue base primary with ipsilateral level 2 nodal metastasis. A right-sided diminutive level 2 node is mildly hypermetabolic and suspicious for contralateral nodal metastasis.   No extracervical metastatic disease identified.  cisplatin weekly x6-8; with concurrent RT [last oct 9th, 2024]  # HOLD cisplatin- weekly cisplatin #5  of planned 6-8 cycles- given significant nausea/ slight drop in renal function. Labs-CBC/chemistries were reviewed with the patient.  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 Discussed regarding low calcium.continue calcium 1200 plus vitamin D-3 1000-over-the-counter-2 pill a day.   SEP 2024- vitamin D levels-p.  # Constipation: improved on Miralax/colace.   # Nausea- sec to cisplatin- G-2 continue anti-emetics [zofran/compazine]-; reminded to take zyprexa.   # Ear ache/ neck pain- Insomnia- sec to left neck LN/ RT-  stable; continue current dose of tramadol.   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 Stable.   # Gait instability- sec to BPPV- walks with cane/ monitor for neuropathy-Stable.     # Baseline weight:[240-250- July-AUG, 2024]s/p evaluation with  [facility] nutrition- Stable.   # Hypermetabolic focus within the prostate in the setting of prostatomegaly.-PSA level- 1- WNL [Aug 2024. ]  # IV access: s/p port placement-- functional-   # Social support: poor social support- counseled   # DISPOSITION: # IVFs 1 lit today+ Dex 8 mg IV today # HOLD Chemo tomorrow-  # IVFs 1 lit today+ Dex 8 mg IV tomorrow # follow up in 1 week-MD-; labs- cbc/cmp;mag;  phos; D-2 Cisplatin; D-3 and D-4- 1 lit IVFs over 1 hour # follow up in 2 weeksMD-; labs- cbc/cmp;mag; phos; D-2  Cisplatin; D-3 and D-4- 1 lit IVFs over 1 hour-Dr.B

## 2022-11-17 NOTE — Progress Notes (Signed)
C/o nausea and vomiting x1 week. Meds he's on not helping, would like something else.  Was seen in ED 11/07/22 for constipation from tramadol. Takes miralax and soft stool otc.  No appetite, drinks ensure 1-3 times a day, but they are starting to give him nausea.

## 2022-11-18 ENCOUNTER — Inpatient Hospital Stay: Payer: Medicare Other

## 2022-11-18 ENCOUNTER — Other Ambulatory Visit: Payer: Self-pay

## 2022-11-18 ENCOUNTER — Ambulatory Visit
Admission: RE | Admit: 2022-11-18 | Discharge: 2022-11-18 | Disposition: A | Discharge status: Home / Self Care | Payer: Medicare Other | Source: Ambulatory Visit | Attending: Radiation Oncology | Admitting: Radiation Oncology

## 2022-11-18 VITALS — BP 138/71 | HR 75 | Temp 97.2°F | Resp 18

## 2022-11-18 DIAGNOSIS — C770 Secondary and unspecified malignant neoplasm of lymph nodes of head, face and neck: Secondary | ICD-10-CM | POA: Diagnosis not present

## 2022-11-18 DIAGNOSIS — C01 Malignant neoplasm of base of tongue: Secondary | ICD-10-CM | POA: Diagnosis not present

## 2022-11-18 DIAGNOSIS — Z510 Encounter for antineoplastic radiation therapy: Secondary | ICD-10-CM | POA: Diagnosis not present

## 2022-11-18 DIAGNOSIS — Z5111 Encounter for antineoplastic chemotherapy: Secondary | ICD-10-CM | POA: Diagnosis not present

## 2022-11-18 DIAGNOSIS — K5900 Constipation, unspecified: Secondary | ICD-10-CM | POA: Diagnosis not present

## 2022-11-18 LAB — RAD ONC ARIA SESSION SUMMARY
Course Elapsed Days: 36
Plan Fractions Treated to Date: 26
Plan Prescribed Dose Per Fraction: 2 Gy
Plan Total Fractions Prescribed: 35
Plan Total Prescribed Dose: 70 Gy
Reference Point Dosage Given to Date: 52 Gy
Reference Point Session Dosage Given: 2 Gy
Session Number: 26

## 2022-11-18 MED ORDER — SODIUM CHLORIDE 0.9 % IV SOLN
Freq: Once | INTRAVENOUS | Status: AC
  Filled 2022-11-18: qty 250

## 2022-11-18 MED ORDER — DEXAMETHASONE SODIUM PHOSPHATE 10 MG/ML IJ SOLN
8.0000 mg | Freq: Once | INTRAMUSCULAR | Status: AC
  Administered 2022-11-18: 8 mg via INTRAVENOUS
  Filled 2022-11-18: qty 1

## 2022-11-18 MED ORDER — ALTEPLASE 2 MG IJ SOLR
2.0000 mg | Freq: Once | INTRAMUSCULAR | Status: DC | PRN
  Filled 2022-11-18: qty 2

## 2022-11-18 MED ORDER — HEPARIN SOD (PORK) LOCK FLUSH 100 UNIT/ML IV SOLN
500.0000 [IU] | Freq: Once | INTRAVENOUS | Status: DC | PRN
  Filled 2022-11-18: qty 5

## 2022-11-18 MED ORDER — SODIUM CHLORIDE 0.9% FLUSH
10.0000 mL | Freq: Once | INTRAVENOUS | Status: DC | PRN
  Filled 2022-11-18: qty 10

## 2022-11-18 NOTE — Progress Notes (Signed)
Nutrition Follow-up:  Patient with SCC of base of tongue.  Patient receiving concurrent chemotherapy and radiation.  Chemo on hold today.  Receiving IV fluids  Met with patient during IV fluids.  Reports nausea and vomiting for the last week.  Appetite has been decreased.  Received fluids and steroids yesterday and took zyprexa last night.  Felt better and reports no nausea.  Was not previously taking zyprexa.      Medications: zyprexa  Labs: glucose 108, BUN 28, creatinine 1.29, calcium 8.8, phosphorus 3.5, Mag 1.9  Anthropometrics:   Weight 226 lb 12.8 oz on 9/25 230 lb on 9/19 234 lb on 9/12 238 lb on 9/3 245 lb on 8/26   NUTRITION DIAGNOSIS: Inadequate oral intake continues   INTERVENTION:  Provides samples of ensure clear and pedialyte packets to add to water.   Encouraged small frequent "snacks/mini meals"  Encouraged taking antiemetics     MONITORING, EVALUATION, GOAL: weight loss, intake   NEXT VISIT: Wednesday, Oct 2 during infusion  Minha Fulco B. Freida Busman, RD, LDN Registered Dietitian [phone]

## 2022-11-18 NOTE — Patient Instructions (Signed)
[facility]  Discharge Instructions: Thank you for choosing [facility] to provide your oncology and hematology care.  If you have a lab appointment with the Cancer Center, please go directly to the Cancer Center and check in at the registration area.  Wear comfortable clothing and clothing appropriate for easy access to any Portacath or PICC line.   We strive to give you quality time with your provider. You may need to reschedule your appointment if you arrive late (15 or more minutes).  Arriving late affects you and other patients whose appointments are after yours.  Also, if you miss three or more appointments without notifying the office, you may be dismissed from the clinic at the provider's discretion.      For prescription refill requests, have your pharmacy contact our office and allow 72 hours for refills to be completed.    Today you received the following chemotherapy and/or immunotherapy agents hydration and decadron       To help prevent nausea and vomiting after your treatment, we encourage you to take your nausea medication as directed.  BELOW ARE SYMPTOMS THAT SHOULD BE REPORTED IMMEDIATELY: *FEVER GREATER THAN 100.4 F (38 C) OR HIGHER *CHILLS OR SWEATING *NAUSEA AND VOMITING THAT IS NOT CONTROLLED WITH YOUR NAUSEA MEDICATION *UNUSUAL SHORTNESS OF BREATH *UNUSUAL BRUISING OR BLEEDING *URINARY PROBLEMS (pain or burning when urinating, or frequent urination) *BOWEL PROBLEMS (unusual diarrhea, constipation, pain near the anus) TENDERNESS IN MOUTH AND THROAT WITH OR WITHOUT PRESENCE OF ULCERS (sore throat, sores in mouth, or a toothache) UNUSUAL RASH, SWELLING OR PAIN  UNUSUAL VAGINAL DISCHARGE OR ITCHING   Items with * indicate a potential emergency and should be followed up as soon as possible or go to the Emergency Department if any problems should occur.  Please show the CHEMOTHERAPY ALERT CARD or IMMUNOTHERAPY ALERT CARD  at check-in to the Emergency Department and triage nurse.  Should you have questions after your visit or need to cancel or reschedule your appointment, please contact [facility]  [phone] and follow the prompts.  Office hours are 8:00 a.m. to 4:30 p.m. Monday - Friday. Please note that voicemails left after 4:00 p.m. may not be returned until the following business day.  We are closed weekends and major holidays. You have access to a nurse at all times for urgent questions. Please call the main number to the clinic [phone] and follow the prompts.  For any non-urgent questions, you may also contact your provider using MyChart. We now offer e-Visits for anyone 29 and older to request care online for non-urgent symptoms. For details visit [email or web address].   Also download the MyChart app! Go to the app store, search "MyChart", open the app, select [facility], and log in with your MyChart username and password.

## 2022-11-19 ENCOUNTER — Other Ambulatory Visit: Payer: Self-pay

## 2022-11-19 ENCOUNTER — Ambulatory Visit
Admission: RE | Admit: 2022-11-19 | Discharge: 2022-11-19 | Disposition: A | Discharge status: Home / Self Care | Payer: Medicare Other | Source: Ambulatory Visit | Attending: Radiation Oncology | Admitting: Radiation Oncology

## 2022-11-19 DIAGNOSIS — C01 Malignant neoplasm of base of tongue: Secondary | ICD-10-CM | POA: Diagnosis not present

## 2022-11-19 DIAGNOSIS — K5900 Constipation, unspecified: Secondary | ICD-10-CM | POA: Diagnosis not present

## 2022-11-19 DIAGNOSIS — Z510 Encounter for antineoplastic radiation therapy: Secondary | ICD-10-CM | POA: Diagnosis not present

## 2022-11-19 DIAGNOSIS — C770 Secondary and unspecified malignant neoplasm of lymph nodes of head, face and neck: Secondary | ICD-10-CM | POA: Diagnosis not present

## 2022-11-19 DIAGNOSIS — Z5111 Encounter for antineoplastic chemotherapy: Secondary | ICD-10-CM | POA: Diagnosis not present

## 2022-11-19 LAB — RAD ONC ARIA SESSION SUMMARY
Course Elapsed Days: 37
Plan Fractions Treated to Date: 27
Plan Prescribed Dose Per Fraction: 2 Gy
Plan Total Fractions Prescribed: 35
Plan Total Prescribed Dose: 70 Gy
Reference Point Dosage Given to Date: 54 Gy
Reference Point Session Dosage Given: 2 Gy
Session Number: 27

## 2022-11-22 ENCOUNTER — Ambulatory Visit
Admission: RE | Admit: 2022-11-22 | Discharge: 2022-11-22 | Disposition: A | Discharge status: Home / Self Care | Payer: Medicare Other | Source: Ambulatory Visit | Attending: Radiation Oncology | Admitting: Radiation Oncology

## 2022-11-22 ENCOUNTER — Other Ambulatory Visit: Payer: Self-pay | Admitting: *Deleted

## 2022-11-22 ENCOUNTER — Other Ambulatory Visit: Payer: Self-pay

## 2022-11-22 DIAGNOSIS — Z5111 Encounter for antineoplastic chemotherapy: Secondary | ICD-10-CM | POA: Diagnosis not present

## 2022-11-22 DIAGNOSIS — C770 Secondary and unspecified malignant neoplasm of lymph nodes of head, face and neck: Secondary | ICD-10-CM | POA: Diagnosis not present

## 2022-11-22 DIAGNOSIS — C01 Malignant neoplasm of base of tongue: Secondary | ICD-10-CM | POA: Diagnosis not present

## 2022-11-22 DIAGNOSIS — Z510 Encounter for antineoplastic radiation therapy: Secondary | ICD-10-CM | POA: Diagnosis not present

## 2022-11-22 DIAGNOSIS — K5900 Constipation, unspecified: Secondary | ICD-10-CM | POA: Diagnosis not present

## 2022-11-22 LAB — RAD ONC ARIA SESSION SUMMARY
Course Elapsed Days: 40
Plan Fractions Treated to Date: 28
Plan Prescribed Dose Per Fraction: 2 Gy
Plan Total Fractions Prescribed: 35
Plan Total Prescribed Dose: 70 Gy
Reference Point Dosage Given to Date: 56 Gy
Reference Point Session Dosage Given: 2 Gy
Session Number: 28

## 2022-11-23 ENCOUNTER — Other Ambulatory Visit: Payer: Self-pay

## 2022-11-23 ENCOUNTER — Inpatient Hospital Stay: Payer: Medicare Other | Admitting: Internal Medicine

## 2022-11-23 ENCOUNTER — Inpatient Hospital Stay: Payer: Medicare Other | Attending: Internal Medicine

## 2022-11-23 ENCOUNTER — Ambulatory Visit
Admission: RE | Admit: 2022-11-23 | Discharge: 2022-11-23 | Disposition: A | Discharge status: Home / Self Care | Payer: Medicare Other | Source: Ambulatory Visit | Attending: Radiation Oncology | Admitting: Radiation Oncology

## 2022-11-23 DIAGNOSIS — G4700 Insomnia, unspecified: Secondary | ICD-10-CM | POA: Insufficient documentation

## 2022-11-23 DIAGNOSIS — R2689 Other abnormalities of gait and mobility: Secondary | ICD-10-CM | POA: Insufficient documentation

## 2022-11-23 DIAGNOSIS — R590 Localized enlarged lymph nodes: Secondary | ICD-10-CM | POA: Insufficient documentation

## 2022-11-23 DIAGNOSIS — Z87891 Personal history of nicotine dependence: Secondary | ICD-10-CM | POA: Insufficient documentation

## 2022-11-23 DIAGNOSIS — Z79899 Other long term (current) drug therapy: Secondary | ICD-10-CM | POA: Diagnosis not present

## 2022-11-23 DIAGNOSIS — K1230 Oral mucositis (ulcerative), unspecified: Secondary | ICD-10-CM | POA: Diagnosis not present

## 2022-11-23 DIAGNOSIS — K5900 Constipation, unspecified: Secondary | ICD-10-CM | POA: Insufficient documentation

## 2022-11-23 DIAGNOSIS — I2510 Atherosclerotic heart disease of native coronary artery without angina pectoris: Secondary | ICD-10-CM | POA: Insufficient documentation

## 2022-11-23 DIAGNOSIS — C770 Secondary and unspecified malignant neoplasm of lymph nodes of head, face and neck: Secondary | ICD-10-CM | POA: Insufficient documentation

## 2022-11-23 DIAGNOSIS — H8110 Benign paroxysmal vertigo, unspecified ear: Secondary | ICD-10-CM | POA: Diagnosis not present

## 2022-11-23 DIAGNOSIS — I110 Hypertensive heart disease with heart failure: Secondary | ICD-10-CM | POA: Insufficient documentation

## 2022-11-23 DIAGNOSIS — R110 Nausea: Secondary | ICD-10-CM | POA: Diagnosis not present

## 2022-11-23 DIAGNOSIS — C01 Malignant neoplasm of base of tongue: Secondary | ICD-10-CM | POA: Insufficient documentation

## 2022-11-23 DIAGNOSIS — Z5111 Encounter for antineoplastic chemotherapy: Secondary | ICD-10-CM | POA: Insufficient documentation

## 2022-11-23 DIAGNOSIS — Z951 Presence of aortocoronary bypass graft: Secondary | ICD-10-CM | POA: Insufficient documentation

## 2022-11-23 DIAGNOSIS — I5022 Chronic systolic (congestive) heart failure: Secondary | ICD-10-CM | POA: Insufficient documentation

## 2022-11-23 DIAGNOSIS — Z7982 Long term (current) use of aspirin: Secondary | ICD-10-CM | POA: Insufficient documentation

## 2022-11-23 DIAGNOSIS — N400 Enlarged prostate without lower urinary tract symptoms: Secondary | ICD-10-CM | POA: Insufficient documentation

## 2022-11-23 DIAGNOSIS — Z510 Encounter for antineoplastic radiation therapy: Secondary | ICD-10-CM | POA: Insufficient documentation

## 2022-11-23 LAB — RAD ONC ARIA SESSION SUMMARY
Course Elapsed Days: 41
Plan Fractions Treated to Date: 29
Plan Prescribed Dose Per Fraction: 2 Gy
Plan Total Fractions Prescribed: 35
Plan Total Prescribed Dose: 70 Gy
Reference Point Dosage Given to Date: 58 Gy
Reference Point Session Dosage Given: 2 Gy
Session Number: 29

## 2022-11-23 MED FILL — Fosaprepitant Dimeglumine For IV Infusion 150 MG (Base Eq): INTRAVENOUS | Qty: 5 | Status: AC

## 2022-11-23 MED FILL — Dexamethasone Sodium Phosphate Inj 100 MG/10ML: INTRAMUSCULAR | Qty: 1 | Status: AC

## 2022-11-23 NOTE — Progress Notes (Deleted)
[facility] CONSULT NOTE  Patient Care Team: Patient, No Pcp Per as PCP - General (General Practice) End, Cristal Deer, MD as PCP - Cardiology (Cardiology) Carmina Miller, MD as Consulting Physician (Radiation Oncology) Earna Coder, MD as Consulting Physician (Oncology)  CHIEF COMPLAINTS/PURPOSE OF CONSULTATION: head and neck cancer  #  Oncology History Overview Note  JULY, 2024- CT scan:  1. 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 2. Metastatic lymphadenopathy at the level 2 station on the left measuring 2.7 x 2.5 x 2.1 cm. 3. Atherosclerotic calcification of the carotid bifurcations.  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   Alone.   C[facility] 77 y.o.  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  1 week ago patient cisplatin- weekly cisplatin #5 was held because of- given significant nausea/ slight drop in renal function.  Patient received IV fluids...  /o nausea and vomiting x1 week. Meds he's on not helping, would like something else.   Was seen in ED 11/07/22 for constipation from tramadol. Takes miralax and soft stool otc.   No appetite, drinks ensure 1-3 times a day, but they are starting to give him nausea  Patient had to go to the ED on Sunday for constipation. Currently on  Miralax; and colace.   Does have some nausea but has been taking antimetics regularly and it has helped keep nausea under control.   Pt has pain in his left lymph node in neck area; the pain goes into his ear and temple which creates headaches. Pain now is a 3/10. At worst it is a 10/10.  Currently OFF  tramadol sec to constipation.   Review of Systems  Constitutional:  Positive for malaise/fatigue and weight loss. Negative for chills, diaphoresis and fever.  HENT:  Negative for nosebleeds and sore throat.   Eyes:  Negative for double vision.  Respiratory:  Negative for cough, hemoptysis, sputum production, shortness of breath and wheezing.   Cardiovascular:  Negative for chest pain, palpitations, orthopnea and leg swelling.  Gastrointestinal:  Positive for nausea. Negative for abdominal pain, blood in stool, constipation, diarrhea, heartburn, melena and vomiting.  Genitourinary:  Negative for dysuria, frequency and urgency.  Musculoskeletal:  Positive for back pain and joint pain.  Skin: Negative.  Negative for itching and rash.  Neurological:  Negative for dizziness, tingling, focal weakness, weakness and headaches.  Endo/Heme/Allergies:  Does not bruise/bleed easily.  Psychiatric/Behavioral:  Negative for depression. The patient is not nervous/anxious and does not have insomnia.      MEDICAL HISTORY:  Past Medical History:  Diagnosis Date   Alternating RBBB & LBBB    Ankle swelling 2024   Arthritis    neck   Bladder tumor    CAD (coronary artery disease)    a. 08/2017 STEMI/Cath: LM 80ost/d, LAD 100p,  LCX 95 ost/p, RCA 60p, 66m, RPDA 80ost; b. 08/2017 CABG x 3: LIMA->LAD, VG->OM, VG->PDA.   Cancer [facility])    bladder tumor   Cancer of base of tongue (HCC)    Essential hypertension    HFrEF (heart failure with reduced ejection fraction) (HCC)    a. 08/2017 TEE: EF 30-35%, sept/ant/inf HK, apical AK. 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 12/2017 Echo: EF 45-50%, diff HK. Gr1 DD. Mildly reduced  RV fxn. Mild RAE.   Hyperlipidemia LDL goal <70    Ischemic cardiomyopathy    a. 08/2017 TEE: EF 30-35%; b. 12/2017 Echo: EF 45-50%.   Myocardial infarction (HCC) 08/27/2017   Shingles    2018 - right side   Vertigo    several months ago    SURGICAL HISTORY: Past Surgical History:  Procedure Laterality Date   CARDIAC CATHETERIZATION     CATARACT EXTRACTION W/PHACO Left 07/18/2018   Procedure: CATARACT EXTRACTION PHACO AND INTRAOCULAR LENS PLACEMENT (IOC)  LEFT;  Surgeon: Nevada Crane, MD;  Location: [facility];  Service: Ophthalmology;  Laterality: Left;   CATARACT EXTRACTION W/PHACO Right 08/14/2018   Procedure: CATARACT EXTRACTION PHACO AND INTRAOCULAR LENS PLACEMENT (IOC)  RIGHT;  Surgeon: Nevada Crane, MD;  Location: [facility];  Service: Ophthalmology;  Laterality: Right;   CORONARY ARTERY BYPASS GRAFT N/A 08/27/2017   Procedure: CORONARY ARTERY BYPASS GRAFTING (CABG) ON PUMP USING LEFT INTERNAL MAMMARY ARTERY AND LEFT GREATER SAPHENOUS VEIN VIA ENDOVEIN HARVEST;  Surgeon: Alleen Borne, MD;  Location: [facility] OR;  Service: Open Heart Surgery;  Laterality: N/A;   CORONARY/GRAFT ACUTE MI REVASCULARIZATION N/A 08/27/2017   Procedure: Coronary/Graft Acute MI Revascularization;  Surgeon: Iran Ouch, MD;  Location: [facility] IN[facility];  Service: Cardiovascular;  Laterality: N/A;   CYSTOSCOPY W/ RETROGRADES Bilateral 12/25/2018   Procedure: CYSTOSCOPY WITH RETROGRADE PYELOGRAM;  Surgeon: Vanna Scotland, MD;  Location: [facility] ORS;  Service: Urology;  Laterality: Bilateral;   FRACTURE SURGERY Right 1958   arm and left wrist compound fracture, no metal   HERNIA REPAIR Right    inguinial   IR IMAGING GUIDED PORT INSERTION  10/15/2022   LEFT HEART CATH AND CORONARY ANGIOGRAPHY N/A 08/27/2017   Procedure: LEFT HEART CATH AND CORONARY ANGIOGRAPHY;  Surgeon: Iran Ouch, MD;  Location: [facility] INV[facility] CV [facility];  Service: Cardiovascular;  Laterality: N/A;    TRANSURETHRAL RESECTION OF BLADDER TUMOR WITH MITOMYCIN-C N/A 12/25/2018   Procedure: TRANSURETHRAL RESECTION OF BLADDER TUMOR WITH Gemcitabine;  Surgeon: Vanna Scotland, MD;  Location: [facility] ORS;  Service: Urology;  Laterality: N/A;    SOCIAL HISTORY: Social History   Socioeconomic History   Marital status: Single    Spouse name: Not on file   Number of children: Not on file   Years of education: Not on file   Highest education level: Not on file  Occupational History   Occupation: supervision, Research scientist (life sciences)    Comment: retired  Tobacco Use   Smoking status: Former    Current packs/day: 0.00    Types: Cigarettes    Quit date: 2000    Years since quitting: 24.7   Smokeless tobacco: Current    Types: Snuff  Vaping Use   Vaping status: Never Used  Substance and Sexual Activity   Alcohol use: Not Currently   Drug use: Not Currently   Sexual activity: Not on file  Other Topics Concern   Not on file  Social History Narrative   Lives alone   Social  Determinants of Health   Financial Resource Strain: Low Risk  (11/06/2018)   Overall Financial Resource Strain (CARDIA)    Difficulty of Paying Living Expenses: Not hard at all  Food Insecurity: No Food Insecurity (09/10/2022)   Hunger Vital Sign    Worried About Running Out of Food in the Last Year: Never true    Ran Out of Food in the Last Year: Never true  Transportation Needs: Not on file (09/07/2022)  Physical Activity: Inactive (11/06/2018)   Exercise Vital Sign    Days of Exercise per Week: 0 days    Minutes of Exercise per Session: 0 min  Stress: No Stress Concern Present (11/06/2018)   [employer] of Occupational Health - Occupational Stress Questionnaire    Feeling of Stress : Not at all  Social Connections: Socially Isolated (11/06/2018)   Social Connection and Isolation Panel [NHANES]    Frequency of Communication with Friends and Family: Once a week    Frequency of Social Gatherings with Friends and  Family: Once a week    Attends Religious Services: Never    Database administrator or Organizations: No    Attends Banker Meetings: Never    Marital Status: Never married  Intimate Partner Violence: Not At Risk (09/10/2022)   Humiliation, Afraid, Rape, and Kick questionnaire    Fear of Current or Ex-Partner: No    Emotionally Abused: No    Physically Abused: No    Sexually Abused: No    FAMILY HISTORY: Family History  Problem Relation Age of Onset   Heart failure Mother    Lung disease Father    Stroke Father    Cervical cancer Maternal Grandmother     ALLERGIES:  has No Known Allergies.  MEDICATIONS:  Current Outpatient Medications  Medication Sig Dispense Refill   acetaminophen (TYLENOL) 650 MG CR tablet Take 650 mg by mouth every 8 (eight) hours as needed for pain.     aspirin EC 81 MG EC tablet Take 1 tablet (81 mg total) by mouth daily.     atorvastatin (LIPITOR) 80 MG tablet Take 1 tablet by mouth once daily 90 tablet 0   carvedilol (COREG) 12.5 MG tablet Take 1 tablet (12.5 mg total) by mouth 2 (two) times daily with a meal. PLEASE CALL OFFICE TO SCHEDULE APPOINTMENT PRIOR TO NEXT REFILL 180 tablet 0   lidocaine-prilocaine (EMLA) cream Apply on the port. 30 -45 min  prior to port access. 30 g 3   losartan (COZAAR) 100 MG tablet Take 1 tablet (100 mg total) by mouth daily. PLEASE CALL OFFICE TO SCHEDULE APPOINTMENT PRIOR TO NEXT REFILL 90 tablet 0   Multiple Vitamin (MULTIVITAMIN WITH MINERALS) TABS tablet Take 1 tablet by mouth daily. One a Day over 50/ lunch     nitroGLYCERIN (NITROSTAT) 0.4 MG SL tablet DISSOLVE ONE TABLET UNDER THE TONGUE EVERY 5 MINUTES AS NEEDED FOR CHEST PAIN.  DO NOT EXCEED A TOTAL OF 3 DOSES IN 15 MINUTES 25 tablet 0   OLANZapine (ZYPREXA) 5 MG tablet Take one pill at night. 30 tablet 0   ondansetron (ZOFRAN) 8 MG tablet One pill every 8 hours as needed for nausea/vomitting. 40 tablet 1   polyethylene glycol (MIRALAX) 17 g packet Take  17 g by mouth daily. 30 each 0   potassium chloride (KLOR-CON) 10 MEQ tablet Take 1 tablet (10 mEq total) by mouth daily. 90 tablet 3   prochlorperazine (COMPAZINE) 10 MG tablet Take 1 tablet (10 mg total) by  mouth every 6 (six) hours as needed for nausea or vomiting. 40 tablet 1   senna-docusate (SENOKOT-S) 8.6-50 MG tablet Take 1 tablet by mouth daily. 30 tablet 0   sucralfate (CARAFATE) 1 g tablet Take 1 tablet (1 g total) by mouth 4 (four) times daily -  with meals and at bedtime. Dissolve in 4 tablespoons warm water 30 minutes before meals. 90 tablet 1   torsemide (DEMADEX) 20 MG tablet Take 1 tablet (20 mg total) by mouth 2 (two) times daily. 180 tablet 3   traMADol HCl 100 MG TABS Take 100 mg by mouth every 8 (eight) hours as needed. 30 tablet 0   No current facility-administered medications for this visit.   Facility-Administered Medications Ordered in Other Visits  Medication Dose Route Frequency Provider Last Rate Last Admin   sodium chloride flush (NS) 0.9 % injection 10 mL  10 mL Intravenous PRN Earna Coder, MD         PHYSICAL EXAMINATION:   There were no vitals filed for this visit.      There were no vitals filed for this visit.     Left neck 2 cm submandibular LN present.   Physical Exam Vitals and nursing note reviewed.  HENT:     Head: Normocephalic and atraumatic.     Mouth/Throat:     Pharynx: Oropharynx is clear.  Eyes:     Extraocular Movements: Extraocular movements intact.     Pupils: Pupils are equal, round, and reactive to light.  Cardiovascular:     Rate and Rhythm: Normal rate and regular rhythm.  Pulmonary:     Comments: Decreased breath sounds bilaterally.  Abdominal:     Palpations: Abdomen is soft.  Musculoskeletal:        General: Normal range of motion.     Cervical back: Normal range of motion.  Skin:    General: Skin is warm.  Neurological:     General: No focal deficit present.     Mental Status: He is alert and  oriented to person, place, and time.  Psychiatric:        Behavior: Behavior normal.        Judgment: Judgment normal.      LABORATORY DATA:  I have reviewed the data as listed Lab Results  Component Value Date   WBC 3.7 (L) 11/17/2022   HGB 10.4 (L) 11/17/2022   HCT 30.7 (L) 11/17/2022   MCV 94.2 11/17/2022   PLT 99 (L) 11/17/2022   Recent Labs    08/25/22 0947 10/05/22 1629 11/03/22 1309 11/10/22 1412 11/17/22 1333  NA 139   < > 132* 133* 135  K 4.1   < > 4.1 4.3 4.7  CL 111   < > 101 103 103  CO2 23   < > 26 26 27   GLUCOSE 110*   < > 115* 119* 108*  BUN 19   < > 19 30* 28*  CREATININE 1.41*   < > 1.08 1.20 1.29*  CALCIUM 8.9   < > 8.6* 8.4* 8.8*  GFRNONAA 52*   < > >60 >60 57*  PROT 7.1  --  6.6  --   --   ALBUMIN 4.0  --  3.7  --   --   AST 26  --  20  --   --   ALT 19  --  21  --   --   ALKPHOS 53  --  60  --   --  BILITOT 0.9  --  0.7  --   --    < > = values in this interval not displayed.    RADIOGRAPHIC STUDIES: I have personally reviewed the radiological images as listed and agreed with the findings in the report. No results found.  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 JULY 31st, 2024-  Left tongue base primary with ipsilateral level 2 nodal metastasis. A right-sided diminutive level 2 node is mildly hypermetabolic and suspicious for contralateral nodal metastasis.   No extracervical metastatic disease identified.  cisplatin weekly x6-8; with concurrent RT [last oct 9th, 2024]  # HOLD cisplatin- weekly cisplatin #5  of planned 6-8 cycles- given significant nausea/ slight drop in renal function. Labs-CBC/chemistries were reviewed with the patient.  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 Discussed regarding low calcium.continue calcium 1200 plus vitamin D-3 1000-over-the-counter-2 pill a day.   SEP 2024- vitamin D levels-p.  # Constipation: improved on Miralax/colace.   # Nausea- sec to cisplatin- G-2 continue anti-emetics [zofran/compazine]-; reminded to take zyprexa.   # Ear ache/ neck pain- Insomnia- sec to left neck LN/ RT-  stable; continue current dose of tramadol.   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 Stable.   # Gait instability- sec to BPPV- walks with cane/ monitor for neuropathy-Stable.     # Baseline weight:[240-250- July-AUG, 2024]s/p evaluation with  [facility] nutrition- Stable.   # Hypermetabolic focus within the prostate in the setting of prostatomegaly.-PSA level- 1- WNL [Aug 2024. ]  # IV access: s/p port placement-- functional-   # Social support: poor social support- counseled   # DISPOSITION: # IVFs 1 lit today+ Dex 8 mg IV today # HOLD Chemo tomorrow-  # IVFs 1 lit today+ Dex 8 mg IV tomorrow # follow up in 1 week-MD-; labs- cbc/cmp;mag;  phos; D-2 Cisplatin; D-3 and D-4- 1 lit IVFs over 1 hour # follow up in 2 weeksMD-; labs- cbc/cmp;mag; phos; D-2 Cisplatin; D-3 and D-4- 1 lit IVFs over 1 hour-Dr.B   All questions were answered. The patient knows to call the clinic with any problems, questions or concerns.    Earna Coder, MD 11/23/2022 10:44 AM

## 2022-11-23 NOTE — Assessment & Plan Note (Deleted)
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 JULY 31st, 2024-  Left tongue base primary with ipsilateral level 2 nodal metastasis. A right-sided diminutive level 2 node is mildly hypermetabolic and suspicious for contralateral nodal metastasis.   No extracervical metastatic disease identified.  cisplatin weekly x6-8; with concurrent RT [last oct 9th, 2024]  # HOLD cisplatin- weekly cisplatin #5  of planned 6-8 cycles- given significant nausea/ slight drop in renal function. Labs-CBC/chemistries were reviewed with the patient.  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 Discussed regarding low calcium.continue calcium 1200 plus vitamin D-3 1000-over-the-counter-2 pill a day.   SEP 2024- vitamin D levels-p.  # Constipation: improved on Miralax/colace.   # Nausea- sec to cisplatin- G-2 continue anti-emetics [zofran/compazine]-; reminded to take zyprexa.   # Ear ache/ neck pain- Insomnia- sec to left neck LN/ RT-  stable; continue current dose of tramadol.   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 Stable.   # Gait instability- sec to BPPV- walks with cane/ monitor for neuropathy-Stable.     # Baseline weight:[240-250- July-AUG, 2024]s/p evaluation with  [facility] nutrition- Stable.   # Hypermetabolic focus within the prostate in the setting of prostatomegaly.-PSA level- 1- WNL [Aug 2024. ]  # IV access: s/p port placement-- functional-   # Social support: poor social support- counseled   # DISPOSITION: # IVFs 1 lit today+ Dex 8 mg IV today # HOLD Chemo tomorrow-  # IVFs 1 lit today+ Dex 8 mg IV tomorrow # follow up in 1 week-MD-; labs- cbc/cmp;mag;  phos; D-2 Cisplatin; D-3 and D-4- 1 lit IVFs over 1 hour # follow up in 2 weeksMD-; labs- cbc/cmp;mag; phos; D-2  Cisplatin; D-3 and D-4- 1 lit IVFs over 1 hour-Dr.B

## 2022-11-24 ENCOUNTER — Inpatient Hospital Stay: Payer: Medicare Other

## 2022-11-24 ENCOUNTER — Encounter: Payer: Self-pay | Admitting: Internal Medicine

## 2022-11-24 ENCOUNTER — Ambulatory Visit
Admission: RE | Admit: 2022-11-24 | Discharge: 2022-11-24 | Disposition: A | Discharge status: Home / Self Care | Payer: Medicare Other | Source: Ambulatory Visit | Attending: Radiation Oncology | Admitting: Radiation Oncology

## 2022-11-24 ENCOUNTER — Other Ambulatory Visit: Payer: Self-pay

## 2022-11-24 ENCOUNTER — Ambulatory Visit: Payer: Medicare Other | Admitting: Internal Medicine

## 2022-11-24 ENCOUNTER — Other Ambulatory Visit: Payer: Medicare Other

## 2022-11-24 ENCOUNTER — Inpatient Hospital Stay (HOSPITAL_BASED_OUTPATIENT_CLINIC_OR_DEPARTMENT_OTHER): Payer: Medicare Other | Admitting: Internal Medicine

## 2022-11-24 VITALS — BP 115/56 | HR 71 | Temp 97.9°F | Wt 223.4 lb

## 2022-11-24 DIAGNOSIS — K1230 Oral mucositis (ulcerative), unspecified: Secondary | ICD-10-CM | POA: Diagnosis not present

## 2022-11-24 DIAGNOSIS — C01 Malignant neoplasm of base of tongue: Secondary | ICD-10-CM

## 2022-11-24 DIAGNOSIS — R590 Localized enlarged lymph nodes: Secondary | ICD-10-CM | POA: Diagnosis not present

## 2022-11-24 DIAGNOSIS — Z510 Encounter for antineoplastic radiation therapy: Secondary | ICD-10-CM | POA: Diagnosis not present

## 2022-11-24 DIAGNOSIS — R110 Nausea: Secondary | ICD-10-CM | POA: Diagnosis not present

## 2022-11-24 DIAGNOSIS — K5900 Constipation, unspecified: Secondary | ICD-10-CM | POA: Diagnosis not present

## 2022-11-24 DIAGNOSIS — Z5111 Encounter for antineoplastic chemotherapy: Secondary | ICD-10-CM | POA: Diagnosis not present

## 2022-11-24 LAB — CBC WITH DIFFERENTIAL (CANCER CENTER ONLY)
Abs Immature Granulocytes: 0.01 10*3/uL (ref 0.00–0.07)
Basophils Absolute: 0 10*3/uL (ref 0.0–0.1)
Basophils Relative: 0 %
Eosinophils Absolute: 0 10*3/uL (ref 0.0–0.5)
Eosinophils Relative: 1 %
HCT: 29.7 % — ABNORMAL LOW (ref 39.0–52.0)
Hemoglobin: 10 g/dL — ABNORMAL LOW (ref 13.0–17.0)
Immature Granulocytes: 0 %
Lymphocytes Relative: 17 %
Lymphs Abs: 0.4 10*3/uL — ABNORMAL LOW (ref 0.7–4.0)
MCH: 32.2 pg (ref 26.0–34.0)
MCHC: 33.7 g/dL (ref 30.0–36.0)
MCV: 95.5 fL (ref 80.0–100.0)
Monocytes Absolute: 0.5 10*3/uL (ref 0.1–1.0)
Monocytes Relative: 19 %
Neutro Abs: 1.6 10*3/uL — ABNORMAL LOW (ref 1.7–7.7)
Neutrophils Relative %: 63 %
Platelet Count: 93 10*3/uL — ABNORMAL LOW (ref 150–400)
RBC: 3.11 MIL/uL — ABNORMAL LOW (ref 4.22–5.81)
RDW: 14.1 % (ref 11.5–15.5)
WBC Count: 2.6 10*3/uL — ABNORMAL LOW (ref 4.0–10.5)
nRBC: 0 % (ref 0.0–0.2)

## 2022-11-24 LAB — RAD ONC ARIA SESSION SUMMARY
Course Elapsed Days: 42
Plan Fractions Treated to Date: 30
Plan Prescribed Dose Per Fraction: 2 Gy
Plan Total Fractions Prescribed: 35
Plan Total Prescribed Dose: 70 Gy
Reference Point Dosage Given to Date: 60 Gy
Reference Point Session Dosage Given: 2 Gy
Session Number: 30

## 2022-11-24 LAB — BASIC METABOLIC PANEL - CANCER CENTER ONLY
Anion gap: 7 (ref 5–15)
BUN: 26 mg/dL — ABNORMAL HIGH (ref 8–23)
CO2: 25 mmol/L (ref 22–32)
Calcium: 8.7 mg/dL — ABNORMAL LOW (ref 8.9–10.3)
Chloride: 104 mmol/L (ref 98–111)
Creatinine: 1.19 mg/dL (ref 0.61–1.24)
GFR, Estimated: >60 mL/min (ref >60)
Glucose, Bld: 104 mg/dL — ABNORMAL HIGH (ref 70–99)
Potassium: 4.8 mmol/L (ref 3.5–5.1)
Sodium: 136 mmol/L (ref 135–145)

## 2022-11-24 LAB — MAGNESIUM: Magnesium: 2 mg/dL (ref 1.7–2.4)

## 2022-11-24 LAB — PHOSPHORUS: Phosphorus: 3.2 mg/dL (ref 2.5–4.6)

## 2022-11-24 MED ORDER — STERILE WATER FOR INJECTION IJ SOLN
5.0000 mL | Freq: Four times a day (QID) | OROMUCOSAL | 1 refills | Status: DC | PRN
  Filled 2022-11-24: qty 480, 12d supply, fill #0
  Filled 2022-12-21: qty 480, 12d supply, fill #1

## 2022-11-24 NOTE — Addendum Note (Signed)
Addended by: Clydia Llano on: 11/24/2022 03:40 PM   Modules accepted: Orders

## 2022-11-24 NOTE — Assessment & Plan Note (Addendum)
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 JULY 31st, 2024-  Left tongue base primary with ipsilateral level 2 nodal metastasis. A right-sided diminutive level 2 node is mildly hypermetabolic and suspicious for contralateral nodal metastasis.   No extracervical metastatic disease identified.  cisplatin weekly x6-8; with concurrent RT [last oct 9th, 2024]  # proceed with cisplatin- weekly cisplatin #5  of planned 7 cycles. Labs-CBC/chemistries were reviewed with the patient.  # Oral Mucositis- G 2-3- sec to chemo-RT- recommend magic mouth wash. Continue salt/baking soda rinses.   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 Discussed regarding low calcium.continue calcium 1200 plus vitamin D-3 1000-over-the-counter-2 pill a day.   SEP 2024- vitamin D levels-55.   # Constipation: improved on Miralax/colace.   # Nausea- sec to cisplatin- G-2 continue anti-emetics [zofran/compazine]-; continue zyprexa.   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 Stable.   # Gait instability- sec to BPPV- walks with cane/ monitor for neuropathy-Stable.    # Baseline weight:[240-250- July-AUG, 2024]s/p evaluation with  [facility]ie nutrition- Stable.   # Hypermetabolic focus within the prostate in the setting of prostatomegaly.-PSA level- 1- WNL [Aug 2024. ]  # IV access: s/p port placement-- functional-  # DISPOSITION: # magic mouth wash.  #  Chemo tomorrow-  # as planned -follow up in 1 week-MD-; labs- cbc/cmp;mag;  phos; D-2 Cisplatin; D-3 and D-4- 1 lit IVFs over 1 hour-Dr.B

## 2022-11-24 NOTE — Progress Notes (Signed)
[facility] CONSULT NOTE  Patient Care Team: Patient, No Pcp Per as PCP - General (General Practice) End, Cristal Deer, MD as PCP - Cardiology (Cardiology) Carmina Miller, MD as Consulting Physician (Radiation Oncology) Earna Coder, MD as Consulting Physician (Oncology)  CHIEF COMPLAINTS/PURPOSE OF CONSULTATION: head and neck cancer  #  Oncology History Overview Note  JULY, 2024- CT scan:  1. 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 2. Metastatic lymphadenopathy at the level 2 station on the left measuring 2.7 x 2.5 x 2.1 cm. 3. Atherosclerotic calcification of the carotid bifurcations.  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   Alone.   Cory Hess 77 y.o.  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  Appx 1 week ago patient cisplatin- weekly cisplatin #5 was held because of- given significant nausea/ slight drop in renal function.  Patient received IV fluids.  C/o mouth sores and fatigue.    Stopped tramadol due to constipation.  Nausea improved since using Zyprexa.   Drinks 2-3 ensure a day.  No appetite. Having trouble swallowing solids and liquids.   Hands are more sensitive to hot/warm water.   Review of Systems  Constitutional:  Positive for malaise/fatigue and weight loss.  Negative for chills, diaphoresis and fever.  HENT:  Negative for nosebleeds and sore throat.   Eyes:  Negative for double vision.  Respiratory:  Negative for cough, hemoptysis, sputum production, shortness of breath and wheezing.   Cardiovascular:  Negative for chest pain, palpitations, orthopnea and leg swelling.  Gastrointestinal:  Positive for nausea. Negative for abdominal pain, blood in stool, constipation, diarrhea, heartburn, melena and vomiting.  Genitourinary:  Negative for dysuria, frequency and urgency.  Musculoskeletal:  Positive for back pain and joint pain.  Skin: Negative.  Negative for itching and rash.  Neurological:  Negative for dizziness, tingling, focal weakness, weakness and headaches.  Endo/Heme/Allergies:  Does not bruise/bleed easily.  Psychiatric/Behavioral:  Negative for depression. The patient is not nervous/anxious and does not have insomnia.      MEDICAL HISTORY:  Past Medical History:  Diagnosis Date   Alternating RBBB & LBBB    Ankle swelling 2024   Arthritis    neck   Bladder tumor    CAD (coronary artery disease)    a. 08/2017 STEMI/Cath: LM 80ost/d, LAD 100p, LCX 95 ost/p, RCA 60p, 56m, RPDA 80ost; b. 08/2017 CABG x 3: LIMA->LAD, VG->OM, VG->PDA.   Cancer [facility])    bladder tumor   Cancer of base of tongue (HCC)    Essential hypertension    HFrEF (heart failure with reduced ejection fraction) (HCC)    a. 08/2017 TEE: EF 30-35%, sept/ant/inf HK, apical AK. 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 12/2017 Echo: EF 45-50%, diff HK. Gr1 DD. Mildly reduced RV fxn. Mild RAE.   Hyperlipidemia LDL  goal <70    Ischemic cardiomyopathy    a. 08/2017 TEE: EF 30-35%; b. 12/2017 Echo: EF 45-50%.   Myocardial infarction (HCC) 08/27/2017   Shingles    2018 - right side   Vertigo    several months ago    SURGICAL HISTORY: Past Surgical History:  Procedure Laterality Date   CARDIAC CATHETERIZATION     CATARACT EXTRACTION W/PHACO Left 07/18/2018   Procedure: CATARACT  EXTRACTION PHACO AND INTRAOCULAR LENS PLACEMENT (IOC)  LEFT;  Surgeon: Nevada Crane, MD;  Location: [facility];  Service: Ophthalmology;  Laterality: Left;   CATARACT EXTRACTION W/PHACO Right 08/14/2018   Procedure: CATARACT EXTRACTION PHACO AND INTRAOCULAR LENS PLACEMENT (IOC)  RIGHT;  Surgeon: Nevada Crane, MD;  Location: [facility];  Service: Ophthalmology;  Laterality: Right;   CORONARY ARTERY BYPASS GRAFT N/A 08/27/2017   Procedure: CORONARY ARTERY BYPASS GRAFTING (CABG) ON PUMP USING LEFT INTERNAL MAMMARY ARTERY AND LEFT GREATER SAPHENOUS VEIN VIA ENDOVEIN HARVEST;  Surgeon: Alleen Borne, MD;  Location: [facility] OR;  Service: Open Heart Surgery;  Laterality: N/A;   CORONARY/GRAFT ACUTE MI REVASCULARIZATION N/A 08/27/2017   Procedure: Coronary/Graft Acute MI Revascularization;  Surgeon: Iran Ouch, MD;  Location: [facility] INVASIVE [facility];  Service: Cardiovascular;  Laterality: N/A;   CYSTOSCOPY W/ RETROGRADES Bilateral 12/25/2018   Procedure: CYSTOSCOPY WITH RETROGRADE PYELOGRAM;  Surgeon: Vanna Scotland, MD;  Location: [facility] ORS;  Service: Urology;  Laterality: Bilateral;   FRACTURE SURGERY Right 1958   arm and left wrist compound fracture, no metal   HERNIA REPAIR Right    inguinial   IR IMAGING GUIDED PORT INSERTION  10/15/2022   LEFT HEART CATH AND CORONARY ANGIOGRAPHY N/A 08/27/2017   Procedure: LEFT HEART CATH AND CORONARY ANGIOGRAPHY;  Surgeon: Iran Ouch, MD;  Location: [facility] INVASIVE CV LAB;  Service: Cardiovascular;  Laterality: N/A;   TRANSURETHRAL RESECTION OF BLADDER TUMOR WITH MITOMYCIN-C N/A 12/25/2018   Procedure: TRANSURETHRAL RESECTION OF BLADDER TUMOR WITH Gemcitabine;  Surgeon: Vanna Scotland, MD;  Location: [facility] ORS;  Service: Urology;  Laterality: N/A;    SOCIAL HISTORY: Social History   Socioeconomic History   Marital status: Single    Spouse name: Not on file   Number of children: Not on file   Years of education: Not on file    Highest education level: Not on file  Occupational History   Occupation: supervision, Research scientist (life sciences)    Comment: retired  Tobacco Use   Smoking status: Former    Current packs/day: 0.00    Types: Cigarettes    Quit date: 2000    Years since quitting: 24.7   Smokeless tobacco: Current    Types: Snuff  Vaping Use   Vaping status: Never Used  Substance and Sexual Activity   Alcohol use: Not Currently   Drug use: Not Currently   Sexual activity: Not on file  Other Topics Concern   Not on file  Social History Narrative   Lives alone   Social Determinants of Health   Financial Resource Strain: Low Risk  (11/06/2018)   Overall Financial Resource Strain (CARDIA)    Difficulty of Paying Living Expenses: Not hard at all  Food Insecurity: No Food Insecurity (09/10/2022)   Hunger Vital Sign    Worried About Running Out of Food in the Last Year: Never true    Ran Out of Food in the Last Year: Never true  Transportation Needs: Not on file (09/07/2022)  Physical Activity: Inactive (11/06/2018)  Exercise Vital Sign    Days of Exercise per Week: 0 days    Minutes of Exercise per Session: 0 min  Stress: No Stress Concern Present (11/06/2018)   [employer] of Occupational Health - Occupational Stress Questionnaire    Feeling of Stress : Not at all  Social Connections: Socially Isolated (11/06/2018)   Social Connection and Isolation Panel [NHANES]    Frequency of Communication with Friends and Family: Once a week    Frequency of Social Gatherings with Friends and Family: Once a week    Attends Religious Services: Never    Database administrator or Organizations: No    Attends Banker Meetings: Never    Marital Status: Never married  Intimate Partner Violence: Not At Risk (09/10/2022)   Humiliation, Afraid, Rape, and Kick questionnaire    Fear of Current or Ex-Partner: No    Emotionally Abused: No    Physically Abused: No    Sexually Abused: No    FAMILY  HISTORY: Family History  Problem Relation Age of Onset   Heart failure Mother    Lung disease Father    Stroke Father    Cervical cancer Maternal Grandmother     ALLERGIES:  has No Known Allergies.  MEDICATIONS:  Current Outpatient Medications  Medication Sig Dispense Refill   acetaminophen (TYLENOL) 650 MG CR tablet Take 650 mg by mouth every 8 (eight) hours as needed for pain.     aspirin EC 81 MG EC tablet Take 1 tablet (81 mg total) by mouth daily.     atorvastatin (LIPITOR) 80 MG tablet Take 1 tablet by mouth once daily 90 tablet 0   carvedilol (COREG) 12.5 MG tablet Take 1 tablet (12.5 mg total) by mouth 2 (two) times daily with a meal. PLEASE CALL OFFICE TO SCHEDULE APPOINTMENT PRIOR TO NEXT REFILL 180 tablet 0   lidocaine-prilocaine (EMLA) cream Apply on the port. 30 -45 min  prior to port access. 30 g 3   losartan (COZAAR) 100 MG tablet Take 1 tablet (100 mg total) by mouth daily. PLEASE CALL OFFICE TO SCHEDULE APPOINTMENT PRIOR TO NEXT REFILL 90 tablet 0   Multiple Vitamin (MULTIVITAMIN WITH MINERALS) TABS tablet Take 1 tablet by mouth daily. One a Day over 50/ lunch     nitroGLYCERIN (NITROSTAT) 0.4 MG SL tablet DISSOLVE ONE TABLET UNDER THE TONGUE EVERY 5 MINUTES AS NEEDED FOR CHEST PAIN.  DO NOT EXCEED A TOTAL OF 3 DOSES IN 15 MINUTES 25 tablet 0   OLANZapine (ZYPREXA) 5 MG tablet Take one pill at night. 30 tablet 0   ondansetron (ZOFRAN) 8 MG tablet One pill every 8 hours as needed for nausea/vomitting. 40 tablet 1   polyethylene glycol (MIRALAX) 17 g packet Take 17 g by mouth daily. 30 each 0   potassium chloride (KLOR-CON) 10 MEQ tablet Take 1 tablet (10 mEq total) by mouth daily. 90 tablet 3   prochlorperazine (COMPAZINE) 10 MG tablet Take 1 tablet (10 mg total) by mouth every 6 (six) hours as needed for nausea or vomiting. 40 tablet 1   senna-docusate (SENOKOT-S) 8.6-50 MG tablet Take 1 tablet by mouth daily. 30 tablet 0   sucralfate (CARAFATE) 1 g tablet Take 1 tablet  (1 g total) by mouth 4 (four) times daily -  with meals and at bedtime. Dissolve in 4 tablespoons warm water 30 minutes before meals. 90 tablet 1   torsemide (DEMADEX) 20 MG tablet Take 1 tablet (20 mg total) by mouth 2 (  two) times daily. 180 tablet 3   traMADol HCl 100 MG TABS Take 100 mg by mouth every 8 (eight) hours as needed. (Patient not taking: Reported on 11/24/2022) 30 tablet 0   No current facility-administered medications for this visit.   Facility-Administered Medications Ordered in Other Visits  Medication Dose Route Frequency Provider Last Rate Last Admin   sodium chloride flush (NS) 0.9 % injection 10 mL  10 mL Intravenous PRN Earna Coder, MD         PHYSICAL EXAMINATION:   Vitals:   11/24/22 1447  BP: (!) 115/56  Pulse: 71  Temp: 97.9 F (36.6 C)  SpO2: 100%        Filed Weights   11/24/22 1447  Weight: 223 lb 6.4 oz (101.3 kg)       Left neck 2 cm submandibular LN present.   Physical Exam Vitals and nursing note reviewed.  HENT:     Head: Normocephalic and atraumatic.     Mouth/Throat:     Pharynx: Oropharynx is clear.  Eyes:     Extraocular Movements: Extraocular movements intact.     Pupils: Pupils are equal, round, and reactive to light.  Cardiovascular:     Rate and Rhythm: Normal rate and regular rhythm.  Pulmonary:     Comments: Decreased breath sounds bilaterally.  Abdominal:     Palpations: Abdomen is soft.  Musculoskeletal:        General: Normal range of motion.     Cervical back: Normal range of motion.  Skin:    General: Skin is warm.  Neurological:     General: No focal deficit present.     Mental Status: He is alert and oriented to person, place, and time.  Psychiatric:        Behavior: Behavior normal.        Judgment: Judgment normal.      LABORATORY DATA:  I have reviewed the data as listed Lab Results  Component Value Date   WBC 2.6 (L) 11/24/2022   HGB 10.0 (L) 11/24/2022   HCT 29.7 (L) 11/24/2022    MCV 95.5 11/24/2022   PLT 93 (L) 11/24/2022   Recent Labs    08/25/22 0947 10/05/22 1629 11/03/22 1309 11/10/22 1412 11/17/22 1333 11/24/22 1348  NA 139   < > 132* 133* 135 136  K 4.1   < > 4.1 4.3 4.7 4.8  CL 111   < > 101 103 103 104  CO2 23   < > 26 26 27 25   GLUCOSE 110*   < > 115* 119* 108* 104*  BUN 19   < > 19 30* 28* 26*  CREATININE 1.41*   < > 1.08 1.20 1.29* 1.19  CALCIUM 8.9   < > 8.6* 8.4* 8.8* 8.7*  GFRNONAA 52*   < > >60 >60 57* >60  PROT 7.1  --  6.6  --   --   --   ALBUMIN 4.0  --  3.7  --   --   --   AST 26  --  20  --   --   --   ALT 19  --  21  --   --   --   ALKPHOS 53  --  60  --   --   --   BILITOT 0.9  --  0.7  --   --   --    < > = values in this interval not displayed.    RADIOGRAPHIC  STUDIES: I have personally reviewed the radiological images as listed and agreed with the findings in the report. No results found.  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 JULY 31st, 2024-  Left tongue base primary with ipsilateral level 2 nodal metastasis. A right-sided diminutive level 2 node is mildly hypermetabolic and suspicious for contralateral nodal metastasis.   No extracervical metastatic disease identified.  cisplatin weekly x6-8; with concurrent RT [last oct 9th, 2024]  # proceed with cisplatin- weekly cisplatin #5  of planned 7 cycles. Labs-CBC/chemistries were reviewed with the patient.  # Oral Mucositis- G 2-3- sec to chemo-RT- recommend magic mouth wash. Continue salt/baking soda rinses.   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 Discussed regarding low calcium.continue calcium 1200 plus vitamin D-3 1000-over-the-counter-2 pill a day.   SEP 2024- vitamin D levels-55.   #  Constipation: improved on Miralax/colace.   # Nausea- sec to cisplatin- G-2 continue anti-emetics [zofran/compazine]-; continue zyprexa.   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 Stable.   # Gait instability- sec to BPPV- walks with cane/ monitor for neuropathy-Stable.    # Baseline weight:[240-250- July-AUG, 2024]s/p evaluation with  [facility] nutrition- Stable.   # Hypermetabolic focus within the prostate in the setting of prostatomegaly.-PSA level- 1- WNL [Aug 2024. ]  # IV access: s/p port placement-- functional-  # DISPOSITION: # magic mouth wash.  #  Chemo tomorrow-  # as planned -follow up in 1 week-MD-; labs- cbc/cmp;mag;  phos; D-2 Cisplatin; D-3 and D-4- 1 lit IVFs over 1 hour-Dr.B   All questions were answered. The patient knows to call the clinic with any problems, questions or concerns.    Earna Coder, MD 11/24/2022 3:20 PM

## 2022-11-24 NOTE — Progress Notes (Signed)
C/o moth sores and fatigue.  Stopped tramadol due to constipation.  Drinks 2-3 ensure a day.  No appetite. Having trouble swallowing solids and liquids.  Hands are more sensitive to hot/warm water.  Has concerns of side effects of chemo.

## 2022-11-25 ENCOUNTER — Inpatient Hospital Stay: Payer: Medicare Other

## 2022-11-25 ENCOUNTER — Encounter: Payer: Self-pay | Admitting: Nurse Practitioner

## 2022-11-25 ENCOUNTER — Ambulatory Visit
Admission: RE | Admit: 2022-11-25 | Discharge: 2022-11-25 | Disposition: A | Discharge status: Home / Self Care | Payer: Medicare Other | Source: Ambulatory Visit | Attending: Radiation Oncology | Admitting: Radiation Oncology

## 2022-11-25 ENCOUNTER — Other Ambulatory Visit: Payer: Self-pay

## 2022-11-25 ENCOUNTER — Ambulatory Visit: Payer: Medicare Other

## 2022-11-25 ENCOUNTER — Other Ambulatory Visit (HOSPITAL_COMMUNITY): Payer: Self-pay

## 2022-11-25 ENCOUNTER — Ambulatory Visit (INDEPENDENT_AMBULATORY_CARE_PROVIDER_SITE_OTHER): Payer: Medicare Other | Admitting: Nurse Practitioner

## 2022-11-25 VITALS — BP 127/57 | HR 72 | Temp 97.0°F | Resp 16

## 2022-11-25 VITALS — BP 138/60 | HR 75 | Ht 72.0 in | Wt 227.2 lb

## 2022-11-25 DIAGNOSIS — K5900 Constipation, unspecified: Secondary | ICD-10-CM | POA: Diagnosis not present

## 2022-11-25 DIAGNOSIS — R110 Nausea: Secondary | ICD-10-CM | POA: Diagnosis not present

## 2022-11-25 DIAGNOSIS — K1230 Oral mucositis (ulcerative), unspecified: Secondary | ICD-10-CM | POA: Diagnosis not present

## 2022-11-25 DIAGNOSIS — I10 Essential (primary) hypertension: Secondary | ICD-10-CM | POA: Insufficient documentation

## 2022-11-25 DIAGNOSIS — Z5111 Encounter for antineoplastic chemotherapy: Secondary | ICD-10-CM | POA: Diagnosis not present

## 2022-11-25 DIAGNOSIS — E785 Hyperlipidemia, unspecified: Secondary | ICD-10-CM | POA: Insufficient documentation

## 2022-11-25 DIAGNOSIS — I25810 Atherosclerosis of coronary artery bypass graft(s) without angina pectoris: Secondary | ICD-10-CM | POA: Insufficient documentation

## 2022-11-25 DIAGNOSIS — I5032 Chronic diastolic (congestive) heart failure: Secondary | ICD-10-CM | POA: Insufficient documentation

## 2022-11-25 DIAGNOSIS — Z510 Encounter for antineoplastic radiation therapy: Secondary | ICD-10-CM | POA: Diagnosis not present

## 2022-11-25 DIAGNOSIS — I255 Ischemic cardiomyopathy: Secondary | ICD-10-CM | POA: Insufficient documentation

## 2022-11-25 DIAGNOSIS — C01 Malignant neoplasm of base of tongue: Secondary | ICD-10-CM | POA: Diagnosis not present

## 2022-11-25 DIAGNOSIS — R590 Localized enlarged lymph nodes: Secondary | ICD-10-CM | POA: Diagnosis not present

## 2022-11-25 LAB — RAD ONC ARIA SESSION SUMMARY
Course Elapsed Days: 43
Plan Fractions Treated to Date: 31
Plan Prescribed Dose Per Fraction: 2 Gy
Plan Total Fractions Prescribed: 35
Plan Total Prescribed Dose: 70 Gy
Reference Point Dosage Given to Date: 62 Gy
Reference Point Session Dosage Given: 2 Gy
Session Number: 31

## 2022-11-25 MED ORDER — SODIUM CHLORIDE 0.9 % IV SOLN
Freq: Once | INTRAVENOUS | Status: AC
  Filled 2022-11-25: qty 250

## 2022-11-25 MED ORDER — SODIUM CHLORIDE 0.9 % IV SOLN
10.0000 mg | Freq: Once | INTRAVENOUS | Status: AC
  Administered 2022-11-25: 10 mg via INTRAVENOUS
  Filled 2022-11-25: qty 10

## 2022-11-25 MED ORDER — TORSEMIDE 20 MG PO TABS: 20.0000 mg | ORAL_TABLET | Freq: Every day | ORAL | 3 refills | Status: DC | PRN

## 2022-11-25 MED ORDER — SODIUM CHLORIDE 0.9 % IV SOLN
150.0000 mg | Freq: Once | INTRAVENOUS | Status: AC
  Administered 2022-11-25: 150 mg via INTRAVENOUS
  Filled 2022-11-25: qty 5

## 2022-11-25 MED ORDER — SODIUM CHLORIDE 0.9 % IV SOLN
40.0000 mg/m2 | Freq: Once | INTRAVENOUS | Status: AC
  Administered 2022-11-25: 100 mg via INTRAVENOUS
  Filled 2022-11-25: qty 100

## 2022-11-25 MED ORDER — PALONOSETRON HCL INJECTION 0.25 MG/5ML
0.2500 mg | Freq: Once | INTRAVENOUS | Status: AC
  Administered 2022-11-25: 0.25 mg via INTRAVENOUS
  Filled 2022-11-25: qty 5

## 2022-11-25 MED ORDER — POTASSIUM CHLORIDE IN NACL 20-0.9 MEQ/L-% IV SOLN
Freq: Once | INTRAVENOUS | Status: AC
  Filled 2022-11-25: qty 1000

## 2022-11-25 MED ORDER — MAGNESIUM SULFATE 2 GM/50ML IV SOLN
2.0000 g | Freq: Once | INTRAVENOUS | Status: AC
  Administered 2022-11-25: 2 g via INTRAVENOUS
  Filled 2022-11-25: qty 50

## 2022-11-25 NOTE — Patient Instructions (Signed)
[facility]  Discharge Instructions: Thank you for choosing [facility] to provide your oncology and hematology care.  If you have a lab appointment with the [facility], please go directly to the [facility] and check in at the registration area.  Wear comfortable clothing and clothing appropriate for easy access to any Portacath or PICC line.   We strive to give you quality time with your provider. You may need to reschedule your appointment if you arrive late (15 or more minutes).  Arriving late affects you and other patients whose appointments are after yours.  Also, if you miss three or more appointments without notifying the office, you may be dismissed from the clinic at the provider's discretion.      For prescription refill requests, have your pharmacy contact our office and allow 72 hours for refills to be completed.    Today you received the following chemotherapy and/or immunotherapy agents Cisplatin      To help prevent nausea and vomiting after your treatment, we encourage you to take your nausea medication as directed.  BELOW ARE SYMPTOMS THAT SHOULD BE REPORTED IMMEDIATELY: *FEVER GREATER THAN 100.4 F (38 C) OR HIGHER *CHILLS OR SWEATING *NAUSEA AND VOMITING THAT IS NOT CONTROLLED WITH YOUR NAUSEA MEDICATION *UNUSUAL SHORTNESS OF BREATH *UNUSUAL BRUISING OR BLEEDING *URINARY PROBLEMS (pain or burning when urinating, or frequent urination) *BOWEL PROBLEMS (unusual diarrhea, constipation, pain near the anus) TENDERNESS IN MOUTH AND THROAT WITH OR WITHOUT PRESENCE OF ULCERS (sore throat, sores in mouth, or a toothache) UNUSUAL RASH, SWELLING OR PAIN  UNUSUAL VAGINAL DISCHARGE OR ITCHING   Items with * indicate a potential emergency and should be followed up as soon as possible or go to the Emergency Department if any problems should occur.  Please show the CHEMOTHERAPY ALERT CARD or IMMUNOTHERAPY ALERT CARD at check-in to  the Emergency Department and triage nurse.  Should you have questions after your visit or need to cancel or reschedule your appointment, please contact [facility]  [phone] and follow the prompts.  Office hours are 8:00 a.m. to 4:30 p.m. Monday - Friday. Please note that voicemails left after 4:00 p.m. may not be returned until the following business day.  We are closed weekends and major holidays. You have access to a nurse at all times for urgent questions. Please call the main number to the clinic [phone] and follow the prompts.  For any non-urgent questions, you may also contact your provider using MyChart. We now offer e-Visits for anyone 28 and older to request care online for non-urgent symptoms. For details visit [email or web address].   Also download the MyChart app! Go to the app store, search "MyChart", open the app, select [facility], and log in with your MyChart username and password.

## 2022-11-25 NOTE — Progress Notes (Signed)
Office Visit    Patient Name: Cory Hess Date of Encounter: 11/25/2022  Primary Care Provider:  Patient, No Pcp Per Primary Cardiologist:  Cory Kendall, MD  Chief Complaint    77 y.o. male with history of CAD status post CABG x 3 in July 2018, hypertension, hyperlipidemia, alternating bundle branch block, ischemic cardiomyopathy, and HFrEF, who presents for CAD and heart failure follow-up.   Past Medical History    Past Medical History:  Diagnosis Date   Alternating RBBB & LBBB    Ankle swelling 2024   Arthritis    neck   Bladder tumor    CAD (coronary artery disease)    a. 08/2017 STEMI/Cath: LM 80ost/d, LAD 100p, LCX 95 ost/p, RCA 60p, 33m, RPDA 80ost; b. 08/2017 CABG x 3: LIMA->LAD, VG->OM, VG->PDA.   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 08/2017 TEE: EF 30-35%, sept/ant/inf HK, apical AK. 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 12/2017 Echo: EF 45-50%, diff HK. Gr1 DD. Mildly reduced RV fxn. Mild RAE; c. 09/2022 Echo: EF 55-60%, mod LVH, GrI DD, nl RV fxn, mildly dil RA, mild MR.   Hyperlipidemia LDL goal <70    Ischemic cardiomyopathy    a. 08/2017 TEE: EF 30-35%; b. 12/2017 Echo: EF 45-50%; c. 09/2022 Echo: EF 55-60%.   Myocardial infarction (HCC) 08/27/2017   Shingles    2018 - right side   Vertigo    several months ago   Past Surgical History:  Procedure Laterality Date   CARDIAC CATHETERIZATION     CATARACT EXTRACTION W/PHACO Left 07/18/2018   Procedure: CATARACT EXTRACTION PHACO AND INTRAOCULAR LENS PLACEMENT (IOC)  LEFT;  Surgeon: Cory Crane, MD;  Location: [facility];  Service: Ophthalmology;  Laterality: Left;   CATARACT EXTRACTION W/PHACO Right 08/14/2018   Procedure: CATARACT EXTRACTION PHACO AND INTRAOCULAR LENS PLACEMENT (IOC)  RIGHT;  Surgeon: Cory Crane, MD;  Location: [facility];  Service: Ophthalmology;   Laterality: Right;   CORONARY ARTERY BYPASS GRAFT N/A 08/27/2017   Procedure: CORONARY ARTERY BYPASS GRAFTING (CABG) ON PUMP USING LEFT INTERNAL MAMMARY ARTERY AND LEFT GREATER SAPHENOUS VEIN VIA ENDOVEIN HARVEST;  Surgeon: Cory Borne, MD;  Location: [facility] OR;  Service: Open Heart Surgery;  Laterality: N/A;   CORONARY/GRAFT ACUTE MI REVASCULARIZATION N/A 08/27/2017   Procedure: Coronary/Graft Acute MI Revascularization;  Surgeon: Cory Ouch, MD;  Location: [facility] INVASIVE CV LAB;  Service: Cardiovascular;  Laterality: N/A;   CYSTOSCOPY W/ RETROGRADES Bilateral 12/25/2018   Procedure: CYSTOSCOPY WITH RETROGRADE PYELOGRAM;  Surgeon: Cory Scotland, MD;  Location: [facility] ORS;  Service: Urology;  Laterality: Bilateral;   FRACTURE SURGERY Right 1958   arm and left wrist compound fracture, no metal   HERNIA REPAIR Right    inguinial   IR IMAGING GUIDED PORT INSERTION  10/15/2022   LEFT HEART CATH AND CORONARY ANGIOGRAPHY N/A 08/27/2017   Procedure: LEFT HEART CATH AND CORONARY ANGIOGRAPHY;  Surgeon: Cory Ouch, MD;  Location: [facility] INVASIVE CV LAB;  Service: Cardiovascular;  Laterality: N/A;   TRANSURETHRAL RESECTION OF BLADDER TUMOR WITH MITOMYCIN-C N/A 12/25/2018   Procedure: TRANSURETHRAL RESECTION OF BLADDER TUMOR WITH Gemcitabine;  Surgeon: Cory Scotland, MD;  Location: [facility] ORS;  Service: Urology;  Laterality: N/A;    Allergies  No Known Allergies  History of Present Illness  77 y.o. y/o male  with above past medical history including hypertension, hyperlipidemia, CAD, alternating bundle branch block, and ischemic cardiomyopathy/HFrEF.  In July 2019, he was admitted with chest pain and new left bundle branch block.  Catheterization showed multivessel CAD and following intra-aortic balloon pump placement, he was transferred to [facility] underwent CABG x 3.  Intraoperative TEE showed an EF of 30 to 35%.  Follow-up echo November 2019, showed improvement in LV function to 45-50%.  He  was maintained on Plavix following his CABG however, this was discontinued in September 2020 in the setting of hematuria and finding of bladder tumor.  He subsequently underwent TURBT in November 2020.  Most recent echocardiogram in August 2024 showed EF of 55 to 60% with moderate LVH and grade 1 diastolic dysfunction.  No significant valvular disease was noted.   [name] was last seen in cardiology clinic on October 05, 2022, at which time he reported a 2-week history of worsening lower extremity edema.  He was placed on torsemide 20 mg BID and Kdur.  He says he took it for a week as prescribed with resolution of swelling however, he had to stop taking it because he is currently undergoing radiation to his tongue/neck and he was having trouble lying still because he had to go to the bathroom.  He is also receiving chemotherapy once weekly though had to skip last week session due to feeling poorly.  In the setting of radiation and chemotherapy, he says his throat has been very sore and he has not been eating or drinking well.  He has not had any significant lower extremity edema and has not felt as though he is required at a torsemide dose.  Lab work from October 2 showed stable renal function and electrolytes as well as normal H&H.  He denies chest pain, dyspnea, palpitations, PND, orthopnea, dizziness, syncope, or edema.  He does experience nausea and early satiety/anorexia.  Home Medications    Current Outpatient Medications  Medication Sig Dispense Refill   acetaminophen (TYLENOL) 650 MG CR tablet Take 650 mg by mouth every 8 (eight) hours as needed for pain.     aspirin EC 81 MG EC tablet Take 1 tablet (81 mg total) by mouth daily.     atorvastatin (LIPITOR) 80 MG tablet Take 1 tablet by mouth once daily 90 tablet 0   carvedilol (COREG) 12.5 MG tablet Take 1 tablet (12.5 mg total) by mouth 2 (two) times daily with a meal. PLEASE CALL OFFICE TO SCHEDULE APPOINTMENT PRIOR TO NEXT REFILL 180 tablet 0    lidocaine-prilocaine (EMLA) cream Apply on the port. 30 -45 min  prior to port access. 30 g 3   losartan (COZAAR) 100 MG tablet Take 1 tablet (100 mg total) by mouth daily. PLEASE CALL OFFICE TO SCHEDULE APPOINTMENT PRIOR TO NEXT REFILL 90 tablet 0   magic mouthwash (multi-ingredient) oral suspension Swish and spit 5-10 mLs 4 (four) times daily as needed. 480 mL 1   Multiple Vitamin (MULTIVITAMIN WITH MINERALS) TABS tablet Take 1 tablet by mouth daily. One a Day over 50/ lunch     nitroGLYCERIN (NITROSTAT) 0.4 MG SL tablet DISSOLVE ONE TABLET UNDER THE TONGUE EVERY 5 MINUTES AS NEEDED FOR CHEST PAIN.  DO NOT EXCEED A TOTAL OF 3 DOSES IN 15 MINUTES 25 tablet 0   OLANZapine (ZYPREXA) 5 MG tablet Take one pill at night. 30 tablet 0   ondansetron (ZOFRAN) 8 MG tablet One pill every 8 hours as needed  for nausea/vomitting. 40 tablet 1   polyethylene glycol (MIRALAX) 17 g packet Take 17 g by mouth daily. 30 each 0   prochlorperazine (COMPAZINE) 10 MG tablet Take 1 tablet (10 mg total) by mouth every 6 (six) hours as needed for nausea or vomiting. 40 tablet 1   senna-docusate (SENOKOT-S) 8.6-50 MG tablet Take 1 tablet by mouth daily. 30 tablet 0   sucralfate (CARAFATE) 1 g tablet Take 1 tablet (1 g total) by mouth 4 (four) times daily -  with meals and at bedtime. Dissolve in 4 tablespoons warm water 30 minutes before meals. 90 tablet 1   traMADol HCl 100 MG TABS Take 100 mg by mouth every 8 (eight) hours as needed. 30 tablet 0   torsemide (DEMADEX) 20 MG tablet Take 1 tablet (20 mg total) by mouth daily as needed (swelling and weight gain). 30 tablet 3   No current facility-administered medications for this visit.   Facility-Administered Medications Ordered in Other Visits  Medication Dose Route Frequency Provider Last Rate Last Admin   sodium chloride flush (NS) 0.9 % injection 10 mL  10 mL Intravenous PRN Earna Coder, MD         Review of Systems    His throat has been very sore in the  setting of radiation therapy.  He has been having some anorexia and early satiety as well as nausea.  He denies chest pain, dyspnea, palpitations, PND, orthopnea, dizziness, syncope, edema, or early satiety.  All other systems reviewed and are otherwise negative except as noted above.    Physical Exam    VS:  BP 138/60 (BP Location: Left Arm, Patient Position: Sitting, Cuff Size: Normal)   Pulse 75   Ht 6' (1.829 m)   Wt 227 lb 3.2 oz (103.1 kg)   SpO2 96%   BMI 30.81 kg/m  , BMI Body mass index is 30.81 kg/m.     GEN: Well nourished, well developed, in no acute distress. HEENT: normal. Neck: Supple, no JVD, carotid bruits, or masses. Cardiac: RRR, no murmurs, rubs, or gallops. No clubbing, cyanosis, trace bilateral ankle edema above the sock line.  Radials 2+/PT 2+ and equal bilaterally.  Respiratory:  Respirations regular and unlabored, clear to auscultation bilaterally. GI: Soft, nontender, nondistended, BS + x 4. MS: no deformity or atrophy. Skin: warm and dry, no rash. Neuro:  Strength and sensation are intact. Psych: Normal affect.  Accessory Clinical Findings    Lab Results  Component Value Date   WBC 2.6 (L) 11/24/2022   HGB 10.0 (L) 11/24/2022   HCT 29.7 (L) 11/24/2022   MCV 95.5 11/24/2022   PLT 93 (L) 11/24/2022   Lab Results  Component Value Date   CREATININE 1.19 11/24/2022   BUN 26 (H) 11/24/2022   NA 136 11/24/2022   K 4.8 11/24/2022   CL 104 11/24/2022   CO2 25 11/24/2022   Lab Results  Component Value Date   ALT 21 11/03/2022   AST 20 11/03/2022   ALKPHOS 60 11/03/2022   BILITOT 0.7 11/03/2022   Lab Results  Component Value Date   CHOL 93 (L) 04/01/2021   HDL 30 (L) 04/01/2021   LDLCALC 45 04/01/2021   LDLDIRECT 58 12/28/2017   TRIG 94 04/01/2021   CHOLHDL 3.1 04/01/2021    Lab Results  Component Value Date   HGBA1C 5.6 08/28/2017    Assessment & Plan    1.  Ischemic cardiomyopathy/chronic heart failure with improved ejection  fraction: EF previously as  low as 35 to 35% at the time of his bypass surgery in 2019.  Most recent echo in August 2024 showed improvement to 55-60% with moderate LVH and grade 1 diastolic dysfunction.  He recently developed lower extremity edema was placed on torsemide.  After taking it for 1 week, he came off of it due to frequent urination which was interrupting his radiation therapy.  He has not taken any since.  He notes poor p.o. intake of food and liquids in the setting of radiation and throat pain.  Creatinine was stable at 1.19 on October 2.  His weight is down from 246 pounds on August 26, to 227 pounds today.  He has only trace ankle edema above his sock line today.  Heart rate and blood pressure stable.  I advised that he may discontinue potassium and use torsemide on an as-needed basis for wt gain or recurrent edema.  We discussed the importance of daily weights, sodium restriction, medication compliance, and symptom reporting and he verbalizes understanding.  Continue beta-blocker and ARB.  2.  Coronary artery disease: Status post non-STEMI and CABG x 3 in July 2019.  He has not been experiencing chest pain or dyspnea.  He remains on aspirin, statin, beta-blocker, and ARB.  3.  Primary hypertension: Blood pressure stable on beta-blocker and ARB.  4.  Hyperlipidemia: LDL of 45 in February of last year.  LFTs normal in September.  Will need follow-up lipids at future appointment when fasting.  Continue statin therapy.  5.  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  Also with a history of prescription block.  No history of presyncope or syncope.  Heart rate stable today.  6.  Disposition: Follow-up in 3 months or sooner if necessary.    Nicolasa Ducking, NP 11/25/2022, 4:44 PM

## 2022-11-25 NOTE — Progress Notes (Signed)
Nutrition Follow-up:  Patient with SCC of base of tongue.  Patient receiving concurrent chemotherapy and radiation.  Last chemotherapy planned for today and last radiation on 10/9.  Met with patient during infusion. Drinking 2 1/2 - 3 ensure complete shakes a day.  Eating some soft solid foods.  Drinking whole milk and chocolate milk.  Taking zyprexa and having less nausea.  Food has no taste  Medications: reviewed  Labs: glucose 104, phosphorus and magnesium WNL  Anthropometrics:   Weight 223 lb today  226 lb 12/8 oz on 9/25 230 lb on 9/19 234 lb on 9/12 238 lb on 9/3 245 lb on 8/26   NUTRITION DIAGNOSIS: Inadequate oral intake continues    INTERVENTION:  Continue to encouraged 350 calorie ensure shake, increase if possible Discussed soft solids high in calories and protein Encouraged taking antiemetics to help with nausea     MONITORING, EVALUATION, GOAL: weight trends, intake   NEXT VISIT: Friday, Oct 11 in clinic  Braedyn Riggle B. Freida Busman, RD, LDN Registered Dietitian [phone]

## 2022-11-25 NOTE — Patient Instructions (Signed)
Medication Instructions:  TORSEMIDE: Take 20 mg as needed for weight gain or swelling.  -If you gain 3 pounds overnight or 5 pounds in one week  *If you need a refill on your cardiac medications before your next appointment, please call your pharmacy*   Lab Work: None ordered If you have labs (blood work) drawn today and your tests are completely normal, you will receive your results only by: MyChart Message (if you have MyChart) OR A paper copy in the mail If you have any lab test that is abnormal or we need to change your treatment, we will call you to review the results.   Testing/Procedures: None ordered   Follow-Up: At [facility], you and your health needs are our priority.  As part of our continuing mission to provide you with exceptional heart care, we have created designated Provider Care Teams.  These Care Teams include your primary Cardiologist (physician) and Advanced Practice Providers (APPs -  Physician Assistants and Nurse Practitioners) who all work together to provide you with the care you need, when you need it.  We recommend signing up for the patient portal called "MyChart".  Sign up information is provided on this After Visit Summary.  MyChart is used to connect with patients for Virtual Visits (Telemedicine).  Patients are able to view lab/test results, encounter notes, upcoming appointments, etc.  Non-urgent messages can be sent to your provider as well.   To learn more about what you can do with MyChart, go to [email or web address].    Your next appointment:   3 month(s)  Provider:   You may see Yvonne Kendall, MD or one of the following Advanced Practice Providers on your designated Care Team:   Nicolasa Ducking, NP

## 2022-11-26 ENCOUNTER — Inpatient Hospital Stay: Payer: Medicare Other

## 2022-11-26 ENCOUNTER — Other Ambulatory Visit: Payer: Self-pay

## 2022-11-26 ENCOUNTER — Ambulatory Visit
Admission: RE | Admit: 2022-11-26 | Discharge: 2022-11-26 | Disposition: A | Discharge status: Home / Self Care | Payer: Medicare Other | Source: Ambulatory Visit | Attending: Radiation Oncology | Admitting: Radiation Oncology

## 2022-11-26 VITALS — BP 144/63 | HR 84 | Temp 97.8°F | Resp 17

## 2022-11-26 DIAGNOSIS — R110 Nausea: Secondary | ICD-10-CM | POA: Diagnosis not present

## 2022-11-26 DIAGNOSIS — K1230 Oral mucositis (ulcerative), unspecified: Secondary | ICD-10-CM | POA: Diagnosis not present

## 2022-11-26 DIAGNOSIS — C01 Malignant neoplasm of base of tongue: Secondary | ICD-10-CM | POA: Diagnosis not present

## 2022-11-26 DIAGNOSIS — R590 Localized enlarged lymph nodes: Secondary | ICD-10-CM | POA: Diagnosis not present

## 2022-11-26 DIAGNOSIS — K5900 Constipation, unspecified: Secondary | ICD-10-CM | POA: Diagnosis not present

## 2022-11-26 DIAGNOSIS — Z5111 Encounter for antineoplastic chemotherapy: Secondary | ICD-10-CM | POA: Diagnosis not present

## 2022-11-26 DIAGNOSIS — Z510 Encounter for antineoplastic radiation therapy: Secondary | ICD-10-CM | POA: Diagnosis not present

## 2022-11-26 LAB — RAD ONC ARIA SESSION SUMMARY
Course Elapsed Days: 44
Plan Fractions Treated to Date: 32
Plan Prescribed Dose Per Fraction: 2 Gy
Plan Total Fractions Prescribed: 35
Plan Total Prescribed Dose: 70 Gy
Reference Point Dosage Given to Date: 64 Gy
Reference Point Session Dosage Given: 2 Gy
Session Number: 32

## 2022-11-26 MED ORDER — SODIUM CHLORIDE 0.9% FLUSH
10.0000 mL | Freq: Once | INTRAVENOUS | Status: AC | PRN
  Administered 2022-11-26: 10 mL
  Filled 2022-11-26: qty 10

## 2022-11-26 MED ORDER — DEXAMETHASONE SODIUM PHOSPHATE 10 MG/ML IJ SOLN
8.0000 mg | Freq: Once | INTRAMUSCULAR | Status: AC
  Administered 2022-11-26: 8 mg via INTRAVENOUS
  Filled 2022-11-26: qty 1

## 2022-11-26 MED ORDER — HEPARIN SOD (PORK) LOCK FLUSH 100 UNIT/ML IV SOLN
250.0000 [IU] | Freq: Once | INTRAVENOUS | Status: DC | PRN
  Filled 2022-11-26: qty 5

## 2022-11-26 MED ORDER — HEPARIN SOD (PORK) LOCK FLUSH 100 UNIT/ML IV SOLN
250.0000 [IU] | Freq: Once | INTRAVENOUS | Status: AC | PRN
  Administered 2022-11-26: 250 [IU]
  Filled 2022-11-26: qty 5

## 2022-11-26 MED ORDER — SODIUM CHLORIDE 0.9 % IV SOLN
Freq: Once | INTRAVENOUS | Status: AC
  Filled 2022-11-26: qty 250

## 2022-11-26 NOTE — Patient Instructions (Signed)

## 2022-11-29 ENCOUNTER — Ambulatory Visit
Admission: RE | Admit: 2022-11-29 | Discharge: 2022-11-29 | Disposition: A | Discharge status: Home / Self Care | Payer: Medicare Other | Source: Ambulatory Visit | Attending: Radiation Oncology | Admitting: Radiation Oncology

## 2022-11-29 ENCOUNTER — Other Ambulatory Visit: Payer: Self-pay

## 2022-11-29 DIAGNOSIS — Z5111 Encounter for antineoplastic chemotherapy: Secondary | ICD-10-CM | POA: Diagnosis not present

## 2022-11-29 DIAGNOSIS — R590 Localized enlarged lymph nodes: Secondary | ICD-10-CM | POA: Diagnosis not present

## 2022-11-29 DIAGNOSIS — R110 Nausea: Secondary | ICD-10-CM | POA: Diagnosis not present

## 2022-11-29 DIAGNOSIS — K5900 Constipation, unspecified: Secondary | ICD-10-CM | POA: Diagnosis not present

## 2022-11-29 DIAGNOSIS — Z510 Encounter for antineoplastic radiation therapy: Secondary | ICD-10-CM | POA: Diagnosis not present

## 2022-11-29 DIAGNOSIS — K1230 Oral mucositis (ulcerative), unspecified: Secondary | ICD-10-CM | POA: Diagnosis not present

## 2022-11-29 DIAGNOSIS — C01 Malignant neoplasm of base of tongue: Secondary | ICD-10-CM | POA: Diagnosis not present

## 2022-11-29 LAB — RAD ONC ARIA SESSION SUMMARY
Course Elapsed Days: 47
Plan Fractions Treated to Date: 33
Plan Prescribed Dose Per Fraction: 2 Gy
Plan Total Fractions Prescribed: 35
Plan Total Prescribed Dose: 70 Gy
Reference Point Dosage Given to Date: 66 Gy
Reference Point Session Dosage Given: 2 Gy
Session Number: 33

## 2022-11-30 ENCOUNTER — Ambulatory Visit: Payer: Medicare Other

## 2022-11-30 ENCOUNTER — Ambulatory Visit
Admission: RE | Admit: 2022-11-30 | Discharge: 2022-11-30 | Disposition: A | Discharge status: Home / Self Care | Payer: Medicare Other | Source: Ambulatory Visit | Attending: Radiation Oncology | Admitting: Radiation Oncology

## 2022-11-30 ENCOUNTER — Inpatient Hospital Stay: Payer: Medicare Other

## 2022-11-30 ENCOUNTER — Inpatient Hospital Stay (HOSPITAL_BASED_OUTPATIENT_CLINIC_OR_DEPARTMENT_OTHER): Payer: Medicare Other | Admitting: Internal Medicine

## 2022-11-30 ENCOUNTER — Other Ambulatory Visit: Payer: Self-pay

## 2022-11-30 ENCOUNTER — Encounter: Payer: Self-pay | Admitting: Internal Medicine

## 2022-11-30 VITALS — BP 127/60 | HR 74 | Temp 97.7°F | Ht 72.0 in | Wt 211.6 lb

## 2022-11-30 DIAGNOSIS — C01 Malignant neoplasm of base of tongue: Secondary | ICD-10-CM

## 2022-11-30 DIAGNOSIS — R590 Localized enlarged lymph nodes: Secondary | ICD-10-CM | POA: Diagnosis not present

## 2022-11-30 DIAGNOSIS — K1230 Oral mucositis (ulcerative), unspecified: Secondary | ICD-10-CM | POA: Diagnosis not present

## 2022-11-30 DIAGNOSIS — R110 Nausea: Secondary | ICD-10-CM | POA: Diagnosis not present

## 2022-11-30 DIAGNOSIS — Z5111 Encounter for antineoplastic chemotherapy: Secondary | ICD-10-CM | POA: Diagnosis not present

## 2022-11-30 DIAGNOSIS — K5900 Constipation, unspecified: Secondary | ICD-10-CM | POA: Diagnosis not present

## 2022-11-30 DIAGNOSIS — Z510 Encounter for antineoplastic radiation therapy: Secondary | ICD-10-CM | POA: Diagnosis not present

## 2022-11-30 LAB — RAD ONC ARIA SESSION SUMMARY
Course Elapsed Days: 48
Plan Fractions Treated to Date: 34
Plan Prescribed Dose Per Fraction: 2 Gy
Plan Total Fractions Prescribed: 35
Plan Total Prescribed Dose: 70 Gy
Reference Point Dosage Given to Date: 68 Gy
Reference Point Session Dosage Given: 2 Gy
Session Number: 34

## 2022-11-30 LAB — CBC WITH DIFFERENTIAL (CANCER CENTER ONLY)
Abs Immature Granulocytes: 0.01 10*3/uL (ref 0.00–0.07)
Basophils Absolute: 0 10*3/uL (ref 0.0–0.1)
Basophils Relative: 0 %
Eosinophils Absolute: 0 10*3/uL (ref 0.0–0.5)
Eosinophils Relative: 1 %
HCT: 28 % — ABNORMAL LOW (ref 39.0–52.0)
Hemoglobin: 9.5 g/dL — ABNORMAL LOW (ref 13.0–17.0)
Immature Granulocytes: 0 %
Lymphocytes Relative: 18 %
Lymphs Abs: 0.4 10*3/uL — ABNORMAL LOW (ref 0.7–4.0)
MCH: 32.2 pg (ref 26.0–34.0)
MCHC: 33.9 g/dL (ref 30.0–36.0)
MCV: 94.9 fL (ref 80.0–100.0)
Monocytes Absolute: 0.4 10*3/uL (ref 0.1–1.0)
Monocytes Relative: 18 %
Neutro Abs: 1.5 10*3/uL — ABNORMAL LOW (ref 1.7–7.7)
Neutrophils Relative %: 63 %
Platelet Count: 92 10*3/uL — ABNORMAL LOW (ref 150–400)
RBC: 2.95 MIL/uL — ABNORMAL LOW (ref 4.22–5.81)
RDW: 14.5 % (ref 11.5–15.5)
WBC Count: 2.3 10*3/uL — ABNORMAL LOW (ref 4.0–10.5)
nRBC: 0 % (ref 0.0–0.2)

## 2022-11-30 LAB — BASIC METABOLIC PANEL - CANCER CENTER ONLY
Anion gap: 10 (ref 5–15)
BUN: 38 mg/dL — ABNORMAL HIGH (ref 8–23)
CO2: 25 mmol/L (ref 22–32)
Calcium: 8.7 mg/dL — ABNORMAL LOW (ref 8.9–10.3)
Chloride: 104 mmol/L (ref 98–111)
Creatinine: 1.48 mg/dL — ABNORMAL HIGH (ref 0.61–1.24)
GFR, Estimated: 49 mL/min — ABNORMAL LOW (ref >60)
Glucose, Bld: 107 mg/dL — ABNORMAL HIGH (ref 70–99)
Potassium: 4.7 mmol/L (ref 3.5–5.1)
Sodium: 139 mmol/L (ref 135–145)

## 2022-11-30 LAB — MAGNESIUM: Magnesium: 1.8 mg/dL (ref 1.7–2.4)

## 2022-11-30 LAB — PHOSPHORUS: Phosphorus: 3.6 mg/dL (ref 2.5–4.6)

## 2022-11-30 MED ORDER — HEPARIN SOD (PORK) LOCK FLUSH 100 UNIT/ML IV SOLN
500.0000 [IU] | Freq: Once | INTRAVENOUS | Status: AC
  Administered 2022-11-30: 500 [IU] via INTRAVENOUS
  Filled 2022-11-30: qty 5

## 2022-11-30 MED ORDER — SODIUM CHLORIDE 0.9% FLUSH
10.0000 mL | INTRAVENOUS | Status: DC | PRN
  Administered 2022-11-30: 10 mL via INTRAVENOUS
  Filled 2022-11-30: qty 10

## 2022-11-30 MED FILL — Dexamethasone Sodium Phosphate Inj 100 MG/10ML: INTRAMUSCULAR | Qty: 1 | Status: AC

## 2022-11-30 MED FILL — Fosaprepitant Dimeglumine For IV Infusion 150 MG (Base Eq): INTRAVENOUS | Qty: 5 | Status: AC

## 2022-11-30 NOTE — Progress Notes (Signed)
[facility] CONSULT NOTE  Patient Care Team: Patient, No Pcp Per as PCP - General (General Practice) End, Cristal Deer, MD as PCP - Cardiology (Cardiology) Carmina Miller, MD as Consulting Physician (Radiation Oncology) Earna Coder, MD as Consulting Physician (Oncology)  CHIEF COMPLAINTS/PURPOSE OF CONSULTATION: head and neck cancer  #  Oncology History Overview Note  JULY, 2024- CT scan:  1. 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 2. Metastatic lymphadenopathy at the level 2 station on the left measuring 2.7 x 2.5 x 2.1 cm. 3. Atherosclerotic calcification of the carotid bifurcations.  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   Alone.   Cory Hess 77 y.o.  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  C/o mouth pain today 4/10, last night 8/10. Not on tramadol sec to constipation.    No appetite, drink 2-2.5 ensure a day.  No appetite. Having trouble swallowing solids and liquids.  Nausea improved since using Zyprexa.   Constipation, laxative and softner 2-3 times a day.     Review of Systems  Constitutional:  Positive for malaise/fatigue and weight loss. Negative for chills, diaphoresis and fever.  HENT:  Negative for nosebleeds and sore throat.   Eyes:  Negative for double vision.   Respiratory:  Negative for cough, hemoptysis, sputum production, shortness of breath and wheezing.   Cardiovascular:  Negative for chest pain, palpitations, orthopnea and leg swelling.  Gastrointestinal:  Positive for nausea. Negative for abdominal pain, blood in stool, constipation, diarrhea, heartburn, melena and vomiting.  Genitourinary:  Negative for dysuria, frequency and urgency.  Musculoskeletal:  Positive for back pain and joint pain.  Skin: Negative.  Negative for itching and rash.  Neurological:  Negative for dizziness, tingling, focal weakness, weakness and headaches.  Endo/Heme/Allergies:  Does not bruise/bleed easily.  Psychiatric/Behavioral:  Negative for depression. The patient is not nervous/anxious and does not have insomnia.      MEDICAL HISTORY:  Past Medical History:  Diagnosis Date   Alternating RBBB & LBBB    Ankle swelling 2024   Arthritis    neck   Bladder tumor    CAD (coronary artery disease)    a. 08/2017 STEMI/Cath: LM 80ost/d, LAD 100p, LCX 95 ost/p, RCA 60p, 27m, RPDA 80ost; b. 08/2017 CABG x 3: LIMA->LAD, VG->OM, VG->PDA.   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 08/2017 TEE: EF 30-35%, sept/ant/inf HK, apical AK. 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 12/2017 Echo: EF 45-50%, diff HK. Gr1 DD. Mildly reduced RV fxn. Mild RAE; c. 09/2022 Echo: EF 55-60%, mod LVH, GrI DD, nl RV fxn, mildly dil RA, mild MR.   Hyperlipidemia LDL goal <70  Ischemic cardiomyopathy    a. 08/2017 TEE: EF 30-35%; b. 12/2017 Echo: EF 45-50%; c. 09/2022 Echo: EF 55-60%.   Myocardial infarction (HCC) 08/27/2017   Shingles    2018 - right side   Vertigo    several months ago    SURGICAL HISTORY: Past Surgical History:  Procedure Laterality Date   CARDIAC CATHETERIZATION     CATARACT EXTRACTION W/PHACO Left 07/18/2018   Procedure: CATARACT EXTRACTION PHACO AND  INTRAOCULAR LENS PLACEMENT (IOC)  LEFT;  Surgeon: Nevada Crane, MD;  Location: [facility];  Service: Ophthalmology;  Laterality: Left;   CATARACT EXTRACTION W/PHACO Right 08/14/2018   Procedure: CATARACT EXTRACTION PHACO AND INTRAOCULAR LENS PLACEMENT (IOC)  RIGHT;  Surgeon: Nevada Crane, MD;  Location: [facility];  Service: Ophthalmology;  Laterality: Right;   CORONARY ARTERY BYPASS GRAFT N/A 08/27/2017   Procedure: CORONARY ARTERY BYPASS GRAFTING (CABG) ON PUMP USING LEFT INTERNAL MAMMARY ARTERY AND LEFT GREATER SAPHENOUS VEIN VIA ENDOVEIN HARVEST;  Surgeon: Alleen Borne, MD;  Location: [facility] OR;  Service: Open Heart Surgery;  Laterality: N/A;   CORONARY/GRAFT ACUTE MI REVASCULARIZATION N/A 08/27/2017   Procedure: Coronary/Graft Acute MI Revascularization;  Surgeon: Iran Ouch, MD;  Location: [facility];  Service: Cardiovascular;  Laterality: N/A;   CYSTOSCOPY W/ RETROGRADES Bilateral 12/25/2018   Procedure: CYSTOSCOPY WITH RETROGRADE PYELOGRAM;  Surgeon: Vanna Scotland, MD;  Location: [facility];  Service: Urology;  Laterality: Bilateral;   FRACTURE SURGERY Right 1958   arm and left wrist compound fracture, no metal   HERNIA REPAIR Right    inguinial   IR IMAGING GUIDED PORT INSERTION  10/15/2022   LEFT HEART CATH AND CORONARY ANGIOGRAPHY N/A 08/27/2017   Procedure: LEFT HEART CATH AND CORONARY ANGIOGRAPHY;  Surgeon: Iran Ouch, MD;  Location: [facility];  Service: Cardiovascular;  Laterality: N/A;   TRANSURETHRAL RESECTION OF BLADDER TUMOR WITH MITOMYCIN-C N/A 12/25/2018   Procedure: TRANSURETHRAL RESECTION OF BLADDER TUMOR WITH Gemcitabine;  Surgeon: Vanna Scotland, MD;  Location: [facility];  Service: Urology;  Laterality: N/A;    SOCIAL HISTORY: Social History   Socioeconomic History   Marital status: Single    Spouse name: Not on file   Number of children: Not on file   Years of education: Not on file   Highest education level:  Not on file  Occupational History   Occupation: supervision, Research scientist (life sciences)    Comment: retired  Tobacco Use   Smoking status: Former    Current packs/day: 0.00    Types: Cigarettes    Quit date: 2000    Years since quitting: 24.7   Smokeless tobacco: Current    Types: Snuff  Vaping Use   Vaping status: Never Used  Substance and Sexual Activity   Alcohol use: Not Currently   Drug use: Not Currently   Sexual activity: Not on file  Other Topics Concern   Not on file  Social History Narrative   Lives alone   Social Determinants of Health   Financial Resource Strain: Low Risk  (11/06/2018)   Overall Financial Resource Strain (CARDIA)    Difficulty of Paying Living Expenses: Not hard at all  Food Insecurity: No Food Insecurity (09/10/2022)   Hunger Vital Sign    Worried About Running Out of Food in the Last Year: Never true    Ran Out of Food in the Last Year: Never true  Transportation Needs: Not on file (09/07/2022)  Physical Activity: Inactive (11/06/2018)  Exercise Vital Sign    Days of Exercise per Week: 0 days    Minutes of Exercise per Session: 0 min  Stress: No Stress Concern Present (11/06/2018)   [employer] of Occupational Health - Occupational Stress Questionnaire    Feeling of Stress : Not at all  Social Connections: Socially Isolated (11/06/2018)   Social Connection and Isolation Panel [NHANES]    Frequency of Communication with Friends and Family: Once a week    Frequency of Social Gatherings with Friends and Family: Once a week    Attends Religious Services: Never    Database administrator or Organizations: No    Attends Banker Meetings: Never    Marital Status: Never married  Intimate Partner Violence: Not At Risk (09/10/2022)   Humiliation, Afraid, Rape, and Kick questionnaire    Fear of Current or Ex-Partner: No    Emotionally Abused: No    Physically Abused: No    Sexually Abused: No    FAMILY HISTORY: Family History   Problem Relation Age of Onset   Heart failure Mother    Lung disease Father    Stroke Father    Cervical cancer Maternal Grandmother     ALLERGIES:  has No Known Allergies.  MEDICATIONS:  Current Outpatient Medications  Medication Sig Dispense Refill   acetaminophen (TYLENOL) 650 MG CR tablet Take 650 mg by mouth every 8 (eight) hours as needed for pain.     aspirin EC 81 MG EC tablet Take 1 tablet (81 mg total) by mouth daily.     atorvastatin (LIPITOR) 80 MG tablet Take 1 tablet by mouth once daily 90 tablet 0   carvedilol (COREG) 12.5 MG tablet Take 1 tablet (12.5 mg total) by mouth 2 (two) times daily with a meal. PLEASE CALL OFFICE TO SCHEDULE APPOINTMENT PRIOR TO NEXT REFILL 180 tablet 0   lidocaine-prilocaine (EMLA) cream Apply on the port. 30 -45 min  prior to port access. 30 g 3   losartan (COZAAR) 100 MG tablet Take 1 tablet (100 mg total) by mouth daily. PLEASE CALL OFFICE TO SCHEDULE APPOINTMENT PRIOR TO NEXT REFILL 90 tablet 0   magic mouthwash (multi-ingredient) oral suspension Swish and spit 5-10 mLs 4 (four) times daily as needed. 480 mL 1   Multiple Vitamin (MULTIVITAMIN WITH MINERALS) TABS tablet Take 1 tablet by mouth daily. One a Day over 50/ lunch     nitroGLYCERIN (NITROSTAT) 0.4 MG SL tablet DISSOLVE ONE TABLET UNDER THE TONGUE EVERY 5 MINUTES AS NEEDED FOR CHEST PAIN.  DO NOT EXCEED A TOTAL OF 3 DOSES IN 15 MINUTES 25 tablet 0   OLANZapine (ZYPREXA) 5 MG tablet Take one pill at night. 30 tablet 0   ondansetron (ZOFRAN) 8 MG tablet One pill every 8 hours as needed for nausea/vomitting. 40 tablet 1   polyethylene glycol (MIRALAX) 17 g packet Take 17 g by mouth daily. 30 each 0   prochlorperazine (COMPAZINE) 10 MG tablet Take 1 tablet (10 mg total) by mouth every 6 (six) hours as needed for nausea or vomiting. 40 tablet 1   senna-docusate (SENOKOT-S) 8.6-50 MG tablet Take 1 tablet by mouth daily. 30 tablet 0   sucralfate (CARAFATE) 1 g tablet Take 1 tablet (1 g total)  by mouth 4 (four) times daily -  with meals and at bedtime. Dissolve in 4 tablespoons warm water 30 minutes before meals. 90 tablet 1   torsemide (DEMADEX) 20 MG tablet Take 1 tablet (20 mg total) by mouth  daily as needed (swelling and weight gain). 30 tablet 3   traMADol HCl 100 MG TABS Take 100 mg by mouth every 8 (eight) hours as needed. 30 tablet 0   No current facility-administered medications for this visit.   Facility-Administered Medications Ordered in Other Visits  Medication Dose Route Frequency Provider Last Rate Last Admin   sodium chloride flush (NS) 0.9 % injection 10 mL  10 mL Intravenous PRN Louretta Shorten R, MD       sodium chloride flush (NS) 0.9 % injection 10 mL  10 mL Intravenous PRN Louretta Shorten R, MD   10 mL at 11/30/22 1510     PHYSICAL EXAMINATION:   Vitals:   11/30/22 1517  BP: 127/60  Pulse: 74  Temp: 97.7 F (36.5 C)  SpO2: 98%        Filed Weights   11/30/22 1517  Weight: 211 lb 9.6 oz (96 kg)       Left neck 2 cm submandibular LN present.   Physical Exam Vitals and nursing note reviewed.  HENT:     Head: Normocephalic and atraumatic.     Mouth/Throat:     Pharynx: Oropharynx is clear.  Eyes:     Extraocular Movements: Extraocular movements intact.     Pupils: Pupils are equal, round, and reactive to light.  Cardiovascular:     Rate and Rhythm: Normal rate and regular rhythm.  Pulmonary:     Comments: Decreased breath sounds bilaterally.  Abdominal:     Palpations: Abdomen is soft.  Musculoskeletal:        General: Normal range of motion.     Cervical back: Normal range of motion.  Skin:    General: Skin is warm.  Neurological:     General: No focal deficit present.     Mental Status: He is alert and oriented to person, place, and time.  Psychiatric:        Behavior: Behavior normal.        Judgment: Judgment normal.      LABORATORY DATA:  I have reviewed the data as listed Lab Results  Component Value  Date   WBC 2.3 (L) 11/30/2022   HGB 9.5 (L) 11/30/2022   HCT 28.0 (L) 11/30/2022   MCV 94.9 11/30/2022   PLT 92 (L) 11/30/2022   Recent Labs    08/25/22 0947 10/05/22 1629 11/03/22 1309 11/10/22 1412 11/17/22 1333 11/24/22 1348 11/30/22 1511  NA 139   < > 132*   < > 135 136 139  K 4.1   < > 4.1   < > 4.7 4.8 4.7  CL 111   < > 101   < > 103 104 104  CO2 23   < > 26   < > 27 25 25   GLUCOSE 110*   < > 115*   < > 108* 104* 107*  BUN 19   < > 19   < > 28* 26* 38*  CREATININE 1.41*   < > 1.08   < > 1.29* 1.19 1.48*  CALCIUM 8.9   < > 8.6*   < > 8.8* 8.7* 8.7*  GFRNONAA 52*   < > >60   < > 57* >60 49*  PROT 7.1  --  6.6  --   --   --   --   ALBUMIN 4.0  --  3.7  --   --   --   --   AST 26  --  20  --   --   --   --  ALT 19  --  21  --   --   --   --   ALKPHOS 53  --  60  --   --   --   --   BILITOT 0.9  --  0.7  --   --   --   --    < > = values in this interval not displayed.    RADIOGRAPHIC STUDIES: I have personally reviewed the radiological images as listed and agreed with the findings in the report. No results found.  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 JULY 31st, 2024-  Left tongue base primary with ipsilateral level 2 nodal metastasis. A right-sided diminutive level 2 node is mildly hypermetabolic and suspicious for contralateral nodal metastasis.   No extracervical metastatic disease identified.  cisplatin weekly x6-8; with concurrent RT [last oct 9th, 2024]  # HOLD cisplatin- weekly cisplatin #6  of planned 7 cycles-given elevated creatinine. Labs-CBC/chemistries were reviewed with the patient.  Will plan IV fluids.  And consider cisplatin again next week.  # Oral Mucositis- G 2-3- sec to chemo-RT-  recommend magic mouth wash. Continue salt/baking soda rinses- stable.   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 Discussed regarding low calcium.continue calcium 1200 plus vitamin D-3 1000-over-the-counter-2 pill a day.   SEP 2024- vitamin D levels-55.   # Constipation: improved on Miralax/colace. Stable.   # Nausea- sec to cisplatin- G-2 continue anti-emetics [zofran/compazine]-; continue zyprexa.- Stable.   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 Stable.    # Gait instability- sec to BPPV- walks with cane/ monitor for neuropathy-Stable.    # Baseline weight:[240-250- July-AUG, 2024]s/p evaluation with  [facility] nutrition- Stable.   # Hypermetabolic focus within the prostate in the setting of prostatomegaly.-PSA level- 1- WNL [Aug 2024. ]  # IV access: s/p port placement-- functional-  # DISPOSITION: #  HOLD  Chemo tomorrow-  # IVFs over 1 hour tomorrow; and as planned later this week.  # follow up in 1 week-MD-; labs- cbc/cmp;mag;  phos; D-2 Cisplatin; D-2 and D-3 1 lit IVFs over 1 hour-Dr.B    All questions were answered. The patient knows to call the clinic with any problems, questions or concerns.    Earna Coder, MD 11/30/2022 5:11 PM

## 2022-11-30 NOTE — Assessment & Plan Note (Addendum)
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 JULY 31st, 2024-  Left tongue base primary with ipsilateral level 2 nodal metastasis. A right-sided diminutive level 2 node is mildly hypermetabolic and suspicious for contralateral nodal metastasis.   No extracervical metastatic disease identified.  cisplatin weekly x6-8; with concurrent RT [last oct 9th, 2024]  # HOLD cisplatin- weekly cisplatin #6  of planned 7 cycles-given elevated creatinine. Labs-CBC/chemistries were reviewed with the patient.  Will plan IV fluids.  And consider cisplatin again next week.  # Oral Mucositis- G 2-3- sec to chemo-RT- recommend magic mouth wash. Continue salt/baking soda rinses- stable.   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 Discussed regarding low calcium.continue calcium 1200 plus vitamin D-3 1000-over-the-counter-2 pill a day.   SEP 2024- vitamin D levels-55.   # Constipation: improved on Miralax/colace. Stable.   # Nausea- sec to cisplatin- G-2 continue anti-emetics [zofran/compazine]-; continue zyprexa.- Stable.   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 Stable.    # Gait instability- sec to BPPV- walks with cane/ monitor for neuropathy-Stable.    # Baseline weight:[240-250- July-AUG, 2024]s/p evaluation with  [facility]ol[facility] nutrition- Stable.   # Hypermetabolic focus within the prostate in the setting of prostatomegaly.-PSA level- 1- WNL [Aug 2024. ]  # IV access: s/p port placement-- functional-  # DISPOSITION: #  HOLD  Chemo tomorrow-  # IVFs over 1 hour tomorrow; and as planned later this week.  # follow up in 1 week-MD-; labs- cbc/cmp;mag;  phos; D-2 Cisplatin; D-2 and D-3 1 lit IVFs over 1 hour-Dr.B

## 2022-11-30 NOTE — Progress Notes (Signed)
C/o mouth pain today 4/10, last night 8/10.  No appetite, drink 2-2.5 ensure a day. Has nausea.  Constipation, laxative and softner 2-3 times a day.

## 2022-12-01 ENCOUNTER — Ambulatory Visit: Payer: Medicare Other

## 2022-12-01 ENCOUNTER — Ambulatory Visit
Admission: RE | Admit: 2022-12-01 | Discharge: 2022-12-01 | Disposition: A | Discharge status: Home / Self Care | Payer: Medicare Other | Source: Ambulatory Visit | Attending: Radiation Oncology | Admitting: Radiation Oncology

## 2022-12-01 ENCOUNTER — Inpatient Hospital Stay: Payer: Medicare Other

## 2022-12-01 ENCOUNTER — Other Ambulatory Visit: Payer: Self-pay

## 2022-12-01 VITALS — BP 133/61 | HR 76 | Temp 97.4°F | Resp 20

## 2022-12-01 DIAGNOSIS — K5900 Constipation, unspecified: Secondary | ICD-10-CM | POA: Diagnosis not present

## 2022-12-01 DIAGNOSIS — K1230 Oral mucositis (ulcerative), unspecified: Secondary | ICD-10-CM | POA: Diagnosis not present

## 2022-12-01 DIAGNOSIS — Z510 Encounter for antineoplastic radiation therapy: Secondary | ICD-10-CM | POA: Diagnosis not present

## 2022-12-01 DIAGNOSIS — C01 Malignant neoplasm of base of tongue: Secondary | ICD-10-CM

## 2022-12-01 DIAGNOSIS — R590 Localized enlarged lymph nodes: Secondary | ICD-10-CM | POA: Diagnosis not present

## 2022-12-01 DIAGNOSIS — Z5111 Encounter for antineoplastic chemotherapy: Secondary | ICD-10-CM | POA: Diagnosis not present

## 2022-12-01 DIAGNOSIS — R110 Nausea: Secondary | ICD-10-CM | POA: Diagnosis not present

## 2022-12-01 LAB — RAD ONC ARIA SESSION SUMMARY
Course Elapsed Days: 49
Plan Fractions Treated to Date: 35
Plan Prescribed Dose Per Fraction: 2 Gy
Plan Total Fractions Prescribed: 35
Plan Total Prescribed Dose: 70 Gy
Reference Point Dosage Given to Date: 70 Gy
Reference Point Session Dosage Given: 2 Gy
Session Number: 35

## 2022-12-01 MED ORDER — SODIUM CHLORIDE 0.9 % IV SOLN
Freq: Once | INTRAVENOUS | Status: AC
  Filled 2022-12-01: qty 250

## 2022-12-01 MED ORDER — HEPARIN SOD (PORK) LOCK FLUSH 100 UNIT/ML IV SOLN
500.0000 [IU] | Freq: Once | INTRAVENOUS | Status: AC | PRN
  Administered 2022-12-01: 500 [IU]
  Filled 2022-12-01: qty 5

## 2022-12-01 MED ORDER — SODIUM CHLORIDE 0.9% FLUSH
10.0000 mL | Freq: Once | INTRAVENOUS | Status: AC | PRN
  Administered 2022-12-01: 10 mL
  Filled 2022-12-01: qty 10

## 2022-12-02 ENCOUNTER — Inpatient Hospital Stay: Payer: Medicare Other

## 2022-12-02 VITALS — BP 132/69 | HR 61 | Temp 96.9°F | Resp 20

## 2022-12-02 DIAGNOSIS — R590 Localized enlarged lymph nodes: Secondary | ICD-10-CM | POA: Diagnosis not present

## 2022-12-02 DIAGNOSIS — Z5111 Encounter for antineoplastic chemotherapy: Secondary | ICD-10-CM | POA: Diagnosis not present

## 2022-12-02 DIAGNOSIS — C01 Malignant neoplasm of base of tongue: Secondary | ICD-10-CM

## 2022-12-02 DIAGNOSIS — R110 Nausea: Secondary | ICD-10-CM | POA: Diagnosis not present

## 2022-12-02 DIAGNOSIS — K1230 Oral mucositis (ulcerative), unspecified: Secondary | ICD-10-CM | POA: Diagnosis not present

## 2022-12-02 DIAGNOSIS — K5900 Constipation, unspecified: Secondary | ICD-10-CM | POA: Diagnosis not present

## 2022-12-02 MED ORDER — SODIUM CHLORIDE 0.9% FLUSH
10.0000 mL | Freq: Once | INTRAVENOUS | Status: AC | PRN
  Administered 2022-12-02: 10 mL
  Filled 2022-12-02: qty 10

## 2022-12-02 MED ORDER — HEPARIN SOD (PORK) LOCK FLUSH 100 UNIT/ML IV SOLN
500.0000 [IU] | Freq: Once | INTRAVENOUS | Status: AC | PRN
  Administered 2022-12-02: 500 [IU]
  Filled 2022-12-02: qty 5

## 2022-12-02 MED ORDER — SODIUM CHLORIDE 0.9 % IV SOLN
Freq: Once | INTRAVENOUS | Status: AC
  Filled 2022-12-02: qty 250

## 2022-12-03 ENCOUNTER — Inpatient Hospital Stay: Payer: Medicare Other

## 2022-12-03 VITALS — BP 121/59 | HR 63 | Temp 96.5°F | Resp 20

## 2022-12-03 DIAGNOSIS — C01 Malignant neoplasm of base of tongue: Secondary | ICD-10-CM

## 2022-12-03 DIAGNOSIS — K1230 Oral mucositis (ulcerative), unspecified: Secondary | ICD-10-CM | POA: Diagnosis not present

## 2022-12-03 DIAGNOSIS — Z5111 Encounter for antineoplastic chemotherapy: Secondary | ICD-10-CM | POA: Diagnosis not present

## 2022-12-03 DIAGNOSIS — R110 Nausea: Secondary | ICD-10-CM | POA: Diagnosis not present

## 2022-12-03 DIAGNOSIS — K5900 Constipation, unspecified: Secondary | ICD-10-CM | POA: Diagnosis not present

## 2022-12-03 DIAGNOSIS — R590 Localized enlarged lymph nodes: Secondary | ICD-10-CM | POA: Diagnosis not present

## 2022-12-03 MED ORDER — SODIUM CHLORIDE 0.9% FLUSH
10.0000 mL | Freq: Once | INTRAVENOUS | Status: AC | PRN
  Administered 2022-12-03: 10 mL
  Filled 2022-12-03: qty 10

## 2022-12-03 MED ORDER — HEPARIN SOD (PORK) LOCK FLUSH 100 UNIT/ML IV SOLN
500.0000 [IU] | Freq: Once | INTRAVENOUS | Status: AC | PRN
  Administered 2022-12-03: 500 [IU]
  Filled 2022-12-03: qty 5

## 2022-12-03 MED ORDER — SODIUM CHLORIDE 0.9 % IV SOLN
Freq: Once | INTRAVENOUS | Status: AC
  Filled 2022-12-03: qty 250

## 2022-12-03 NOTE — Progress Notes (Signed)
Nutrition Follow-up:  Patient with SCC base of tongue.  Has completed radiation and chemotherapy.    Met with patient during IV fluids.  Reports that he is mainly drinking ensure shakes and shamrock shakes from [employer].  Drinking about 3 shakes a day.  Likes the taste of the Shamrock shake better than ensure.  Not eating much solid foods due to sore throat from radiation.      Medications: reviewed  Labs: reviewed  Anthropometrics:   Weight 211 lb on 10/8 223 lb on 10/3 226 lb 12.8 oz on 9/25 230 lb on 9/19 234 lb on 9/12 238 lb on 9/3 245 lb on 8/26   NUTRITION DIAGNOSIS: Inadequate oral intake continues   INTERVENTION:  Encouraged 2, 350 calorie shake TID Gave samples of pedilyte packets to mix in water and try for hydration.     MONITORING, EVALUATION, GOAL: weight trends, intake   NEXT VISIT: Friday, Nov 8 phone call  Yordy Matton B. Freida Busman, RD, LDN Registered Dietitian [phone]

## 2022-12-07 ENCOUNTER — Inpatient Hospital Stay: Payer: Medicare Other

## 2022-12-07 ENCOUNTER — Encounter: Payer: Self-pay | Admitting: Nurse Practitioner

## 2022-12-07 ENCOUNTER — Encounter: Payer: Self-pay | Admitting: Internal Medicine

## 2022-12-07 ENCOUNTER — Inpatient Hospital Stay (HOSPITAL_BASED_OUTPATIENT_CLINIC_OR_DEPARTMENT_OTHER): Payer: Medicare Other | Admitting: Nurse Practitioner

## 2022-12-07 VITALS — BP 121/59 | HR 74 | Temp 98.1°F | Ht 72.0 in | Wt 223.0 lb

## 2022-12-07 DIAGNOSIS — K1233 Oral mucositis (ulcerative) due to radiation: Secondary | ICD-10-CM | POA: Diagnosis not present

## 2022-12-07 DIAGNOSIS — T451X5A Adverse effect of antineoplastic and immunosuppressive drugs, initial encounter: Secondary | ICD-10-CM | POA: Diagnosis not present

## 2022-12-07 DIAGNOSIS — R110 Nausea: Secondary | ICD-10-CM | POA: Diagnosis not present

## 2022-12-07 DIAGNOSIS — Z87891 Personal history of nicotine dependence: Secondary | ICD-10-CM

## 2022-12-07 DIAGNOSIS — N179 Acute kidney failure, unspecified: Secondary | ICD-10-CM

## 2022-12-07 DIAGNOSIS — Z79899 Other long term (current) drug therapy: Secondary | ICD-10-CM

## 2022-12-07 DIAGNOSIS — C01 Malignant neoplasm of base of tongue: Secondary | ICD-10-CM

## 2022-12-07 DIAGNOSIS — R590 Localized enlarged lymph nodes: Secondary | ICD-10-CM | POA: Diagnosis not present

## 2022-12-07 DIAGNOSIS — K1230 Oral mucositis (ulcerative), unspecified: Secondary | ICD-10-CM | POA: Diagnosis not present

## 2022-12-07 DIAGNOSIS — Z5111 Encounter for antineoplastic chemotherapy: Secondary | ICD-10-CM | POA: Diagnosis not present

## 2022-12-07 DIAGNOSIS — K5900 Constipation, unspecified: Secondary | ICD-10-CM | POA: Diagnosis not present

## 2022-12-07 LAB — CMP (CANCER CENTER ONLY)
ALT: 21 U/L (ref 0–44)
AST: 23 U/L (ref 15–41)
Albumin: 3.4 g/dL — ABNORMAL LOW (ref 3.5–5.0)
Alkaline Phosphatase: 73 U/L (ref 38–126)
Anion gap: 4 — ABNORMAL LOW (ref 5–15)
BUN: 25 mg/dL — ABNORMAL HIGH (ref 8–23)
CO2: 24 mmol/L (ref 22–32)
Calcium: 8.5 mg/dL — ABNORMAL LOW (ref 8.9–10.3)
Chloride: 107 mmol/L (ref 98–111)
Creatinine: 1.39 mg/dL — ABNORMAL HIGH (ref 0.61–1.24)
GFR, Estimated: 53 mL/min — ABNORMAL LOW (ref >60)
Glucose, Bld: 111 mg/dL — ABNORMAL HIGH (ref 70–99)
Potassium: 4.5 mmol/L (ref 3.5–5.1)
Sodium: 135 mmol/L (ref 135–145)
Total Bilirubin: 0.5 mg/dL (ref 0.3–1.2)
Total Protein: 6.3 g/dL — ABNORMAL LOW (ref 6.5–8.1)

## 2022-12-07 LAB — CBC WITH DIFFERENTIAL (CANCER CENTER ONLY)
Abs Immature Granulocytes: 0.01 10*3/uL (ref 0.00–0.07)
Basophils Absolute: 0 10*3/uL (ref 0.0–0.1)
Basophils Relative: 0 %
Eosinophils Absolute: 0 10*3/uL (ref 0.0–0.5)
Eosinophils Relative: 1 %
HCT: 25.3 % — ABNORMAL LOW (ref 39.0–52.0)
Hemoglobin: 8.6 g/dL — ABNORMAL LOW (ref 13.0–17.0)
Immature Granulocytes: 0 %
Lymphocytes Relative: 16 %
Lymphs Abs: 0.4 10*3/uL — ABNORMAL LOW (ref 0.7–4.0)
MCH: 32.3 pg (ref 26.0–34.0)
MCHC: 34 g/dL (ref 30.0–36.0)
MCV: 95.1 fL (ref 80.0–100.0)
Monocytes Absolute: 0.4 10*3/uL (ref 0.1–1.0)
Monocytes Relative: 16 %
Neutro Abs: 1.8 10*3/uL (ref 1.7–7.7)
Neutrophils Relative %: 67 %
Platelet Count: 124 10*3/uL — ABNORMAL LOW (ref 150–400)
RBC: 2.66 MIL/uL — ABNORMAL LOW (ref 4.22–5.81)
RDW: 15 % (ref 11.5–15.5)
WBC Count: 2.7 10*3/uL — ABNORMAL LOW (ref 4.0–10.5)
nRBC: 0 % (ref 0.0–0.2)

## 2022-12-07 LAB — MAGNESIUM: Magnesium: 1.7 mg/dL (ref 1.7–2.4)

## 2022-12-07 LAB — PHOSPHORUS: Phosphorus: 2.9 mg/dL (ref 2.5–4.6)

## 2022-12-07 MED ORDER — HEPARIN SOD (PORK) LOCK FLUSH 100 UNIT/ML IV SOLN
500.0000 [IU] | Freq: Once | INTRAVENOUS | Status: AC
  Administered 2022-12-07: 500 [IU] via INTRAVENOUS
  Filled 2022-12-07: qty 5

## 2022-12-07 NOTE — Progress Notes (Signed)
[facility] CONSULT NOTE  Patient Care Team: Patient, No Pcp Per as PCP - General (General Practice) End, Cristal Deer, MD as PCP - Cardiology (Cardiology) Carmina Miller, MD as Consulting Physician (Radiation Oncology) Earna Coder, MD as Consulting Physician (Oncology)  CHIEF COMPLAINTS/PURPOSE OF CONSULTATION: head and neck cancer  #  Oncology History Overview Note  JULY, 2024- CT scan:  1. 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 2. Metastatic lymphadenopathy at the level 2 station on the left measuring 2.7 x 2.5 x 2.1 cm. 3. Atherosclerotic calcification of the carotid bifurcations.  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   Alone.   Cory Hess 77 y.o.  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 He has completed radiation. Treatment was held last week due to increased kidney function. He continues to complain of oral pain. No appetite. Has started drinking [employer] nutrition drinks. Unable to tolerate solids. Trouble with thin liquids. Not on tramadol secondary to ocnstipation. He feels weak. Nausea improved. Ongoing constipation and he is taking stool softeners.    Review of Systems  Constitutional:  Positive for malaise/fatigue and weight loss. Negative for chills, diaphoresis and  fever.  HENT:  Positive for sore throat. Negative for nosebleeds.   Eyes:  Negative for double vision.  Respiratory:  Negative for cough, hemoptysis, sputum production, shortness of breath and wheezing.   Cardiovascular:  Negative for chest pain, palpitations, orthopnea and leg swelling.  Gastrointestinal:  Positive for constipation and nausea. Negative for abdominal pain, blood in stool, diarrhea, heartburn, melena and vomiting.  Genitourinary:  Negative for dysuria, frequency and urgency.  Musculoskeletal:  Positive for back pain and joint pain. Negative for falls.  Skin: Negative.  Negative for itching and rash.  Neurological:  Positive for weakness. Negative for dizziness, tingling, focal weakness and headaches.  Endo/Heme/Allergies:  Does not bruise/bleed easily.  Psychiatric/Behavioral:  Negative for depression. The patient is not nervous/anxious and does not have insomnia.      MEDICAL HISTORY:  Past Medical History:  Diagnosis Date   Alternating RBBB & LBBB    Ankle swelling 2024   Arthritis    neck   Bladder tumor    CAD (coronary artery disease)    a. 08/2017 STEMI/Cath: LM 80ost/d, LAD 100p, LCX 95 ost/p, RCA 60p, 81m, RPDA 80ost; b. 08/2017 CABG x 3: LIMA->LAD, VG->OM, VG->PDA.   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 08/2017 TEE: EF 30-35%, sept/ant/inf HK, apical AK. 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 12/2017 Echo: EF 45-50%, diff HK. Gr1 DD. Mildly reduced RV fxn. Mild RAE; c. 09/2022 Echo: EF 55-60%, mod LVH, GrI DD, nl RV fxn,  mildly dil RA, mild MR.   Hyperlipidemia LDL goal <70    Ischemic cardiomyopathy    a. 08/2017 TEE: EF 30-35%; b. 12/2017 Echo: EF 45-50%; c. 09/2022 Echo: EF 55-60%.   Myocardial infarction (HCC) 08/27/2017   Shingles    2018 - right side   Vertigo    several months ago    SURGICAL HISTORY: Past Surgical History:  Procedure  Laterality Date   CARDIAC CATHETERIZATION     CATARACT EXTRACTION W/PHACO Left 07/18/2018   Procedure: CATARACT EXTRACTION PHACO AND INTRAOCULAR LENS PLACEMENT (IOC)  LEFT;  Surgeon: Nevada Crane, MD;  Location: [facility];  Service: Ophthalmology;  Laterality: Left;   CATARACT EXTRACTION W/PHACO Right 08/14/2018   Procedure: CATARACT EXTRACTION PHACO AND INTRAOCULAR LENS PLACEMENT (IOC)  RIGHT;  Surgeon: Nevada Crane, MD;  Location: [facility];  Service: Ophthalmology;  Laterality: Right;   CORONARY ARTERY BYPASS GRAFT N/A 08/27/2017   Procedure: CORONARY ARTERY BYPASS GRAFTING (CABG) ON PUMP USING LEFT INTERNAL MAMMARY ARTERY AND LEFT GREATER SAPHENOUS VEIN VIA ENDOVEIN HARVEST;  Surgeon: Alleen Borne, MD;  Location: [facility] OR;  Service: Open Heart Surgery;  Laterality: N/A;   CORONARY/GRAFT ACUTE MI REVASCULARIZATION N/A 08/27/2017   Procedure: Coronary/Graft Acute MI Revascularization;  Surgeon: Iran Ouch, MD;  Location: [facility];  Service: Cardiovascular;  Laterality: N/A;   CYSTOSCOPY W/ RETROGRADES Bilateral 12/25/2018   Procedure: CYSTOSCOPY WITH RETROGRADE PYELOGRAM;  Surgeon: Vanna Scotland, MD;  Location: [facility] OR[facility];  Service: Urology;  Laterality: Bilateral;   FRACTURE SURGERY Right 1958   arm and left wrist compound fracture, no metal   HERNIA REPAIR Right    inguinial   IR IMAGING GUIDED PORT INSERTION  10/15/2022   LEFT HEART CATH AND CORONARY ANGIOGRAPHY N/A 08/27/2017   Procedure: LEFT HEART CATH AND CORONARY ANGIOGRAPHY;  Surgeon: Iran Ouch, MD;  Location: [facility];  Service: Cardiovascular;  Laterality: N/A;   TRANSURETHRAL RESECTION OF BLADDER TUMOR WITH MITOMYCIN-C N/A 12/25/2018   Procedure: TRANSURETHRAL RESECTION OF BLADDER TUMOR WITH Gemcitabine;  Surgeon: Vanna Scotland, MD;  Location: [facility] OR[facility];  Service: Urology;  Laterality: N/A;    SOCIAL HISTORY: Social History   Socioeconomic History   Marital status:  Single    Spouse name: Not on file   Number of children: Not on file   Years of education: Not on file   Highest education level: Not on file  Occupational History   Occupation: supervision, Research scientist (life sciences)    Comment: retired  Tobacco Use   Smoking status: Former    Current packs/day: 0.00    Types: Cigarettes    Quit date: 2000    Years since quitting: 24.8   Smokeless tobacco: Current    Types: Snuff  Vaping Use   Vaping status: Never Used  Substance and Sexual Activity   Alcohol use: Not Currently   Drug use: Not Currently   Sexual activity: Not on file  Other Topics Concern   Not on file  Social History Narrative   Lives alone   Social Determinants of Health   Financial Resource Strain: Low Risk  (11/06/2018)   Overall Financial Resource Strain (CARDIA)    Difficulty of Paying Living Expenses: Not hard at all  Food Insecurity: No Food Insecurity (09/10/2022)   Hunger Vital Sign    Worried About Running Out of Food in the Last Year: Never true    Ran Out of Food in the Last Year: Never true  Transportation Needs: Not on file (09/07/2022)  Physical Activity: Inactive (11/06/2018)   Exercise Vital Sign    Days of Exercise per Week: 0 days    Minutes of Exercise per Session: 0 min  Stress: No Stress Concern Present (11/06/2018)   [employer] of Occupational Health - Occupational Stress Questionnaire    Feeling of Stress : Not at all  Social Connections: Socially Isolated (11/06/2018)   Social Connection and Isolation Panel [NHANES]    Frequency of Communication with Friends and Family: Once a week    Frequency of Social Gatherings with Friends and Family: Once a week    Attends Religious Services: Never    Database administrator or Organizations: No    Attends Banker Meetings: Never    Marital Status: Never married  Intimate Partner Violence: Not At Risk (09/10/2022)   Humiliation, Afraid, Rape, and Kick questionnaire    Fear of Current or  Ex-Partner: No    Emotionally Abused: No    Physically Abused: No    Sexually Abused: No    FAMILY HISTORY: Family History  Problem Relation Age of Onset   Heart failure Mother    Lung disease Father    Stroke Father    Cervical cancer Maternal Grandmother     ALLERGIES:  has No Known Allergies.  MEDICATIONS:  Current Outpatient Medications  Medication Sig Dispense Refill   acetaminophen (TYLENOL) 650 MG CR tablet Take 650 mg by mouth every 8 (eight) hours as needed for pain.     aspirin EC 81 MG EC tablet Take 1 tablet (81 mg total) by mouth daily.     atorvastatin (LIPITOR) 80 MG tablet Take 1 tablet by mouth once daily 90 tablet 0   carvedilol (COREG) 12.5 MG tablet Take 1 tablet (12.5 mg total) by mouth 2 (two) times daily with a meal. PLEASE CALL OFFICE TO SCHEDULE APPOINTMENT PRIOR TO NEXT REFILL 180 tablet 0   lidocaine-prilocaine (EMLA) cream Apply on the port. 30 -45 min  prior to port access. 30 g 3   losartan (COZAAR) 100 MG tablet Take 1 tablet (100 mg total) by mouth daily. PLEASE CALL OFFICE TO SCHEDULE APPOINTMENT PRIOR TO NEXT REFILL 90 tablet 0   magic mouthwash (multi-ingredient) oral suspension Swish and spit 5-10 mLs 4 (four) times daily as needed. 480 mL 1   Multiple Vitamin (MULTIVITAMIN WITH MINERALS) TABS tablet Take 1 tablet by mouth daily. One a Day over 50/ lunch     nitroGLYCERIN (NITROSTAT) 0.4 MG SL tablet DISSOLVE ONE TABLET UNDER THE TONGUE EVERY 5 MINUTES AS NEEDED FOR CHEST PAIN.  DO NOT EXCEED A TOTAL OF 3 DOSES IN 15 MINUTES 25 tablet 0   OLANZapine (ZYPREXA) 5 MG tablet Take one pill at night. 30 tablet 0   ondansetron (ZOFRAN) 8 MG tablet One pill every 8 hours as needed for nausea/vomitting. 40 tablet 1   polyethylene glycol (MIRALAX) 17 g packet Take 17 g by mouth daily. 30 each 0   prochlorperazine (COMPAZINE) 10 MG tablet Take 1 tablet (10 mg total) by mouth every 6 (six) hours as needed for nausea or vomiting. 40 tablet 1   senna-docusate  (SENOKOT-S) 8.6-50 MG tablet Take 1 tablet by mouth daily. 30 tablet 0   sucralfate (CARAFATE) 1 g tablet Take 1 tablet (1 g total) by mouth 4 (four) times daily -  with meals and at bedtime. Dissolve in 4 tablespoons warm water 30 minutes before meals. 90 tablet 1  torsemide (DEMADEX) 20 MG tablet Take 1 tablet (20 mg total) by mouth daily as needed (swelling and weight gain). 30 tablet 3   traMADol HCl 100 MG TABS Take 100 mg by mouth every 8 (eight) hours as needed. 30 tablet 0   No current facility-administered medications for this visit.   Facility-Administered Medications Ordered in Other Visits  Medication Dose Route Frequency Provider Last Rate Last Admin   sodium chloride flush (NS) 0.9 % injection 10 mL  10 mL Intravenous PRN Louretta Shorten R, MD       sodium chloride flush (NS) 0.9 % injection 10 mL  10 mL Intravenous PRN Louretta Shorten R, MD   10 mL at 11/30/22 1510     PHYSICAL EXAMINATION:   Vitals:   12/07/22 0803  BP: (!) 121/59  Pulse: 74  Temp: 98.1 F (36.7 C)  SpO2: 100%   Filed Weights   12/07/22 0803  Weight: 223 lb (101.2 kg)    Physical Exam Vitals and nursing note reviewed.  Constitutional:      Comments: Fatigued appearing. Unaccompanied. Ambulating.   HENT:     Head: Normocephalic and atraumatic.     Mouth/Throat:     Mouth: Mucous membranes are dry.     Pharynx: Oropharynx is clear. No posterior oropharyngeal erythema.  Cardiovascular:     Rate and Rhythm: Normal rate and regular rhythm.  Pulmonary:     Comments: Decreased breath sounds bilaterally.  Abdominal:     Palpations: Abdomen is soft.  Musculoskeletal:        General: Normal range of motion.  Skin:    General: Skin is warm.     Comments: Mild erythema of neck  Neurological:     General: No focal deficit present.     Mental Status: He is alert and oriented to person, place, and time.  Psychiatric:        Mood and Affect: Mood normal.        Behavior: Behavior  normal.    LABORATORY DATA:  I have reviewed the data as listed Lab Results  Component Value Date   WBC 2.7 (L) 12/07/2022   HGB 8.6 (L) 12/07/2022   HCT 25.3 (L) 12/07/2022   MCV 95.1 12/07/2022   PLT 124 (L) 12/07/2022   Recent Labs    08/25/22 0947 10/05/22 1629 11/03/22 1309 11/10/22 1412 11/24/22 1348 11/30/22 1511 12/07/22 0800  NA 139   < > 132*   < > 136 139 135  K 4.1   < > 4.1   < > 4.8 4.7 4.5  CL 111   < > 101   < > 104 104 107  CO2 23   < > 26   < > 25 25 24   GLUCOSE 110*   < > 115*   < > 104* 107* 111*  BUN 19   < > 19   < > 26* 38* 25*  CREATININE 1.41*   < > 1.08   < > 1.19 1.48* 1.39*  CALCIUM 8.9   < > 8.6*   < > 8.7* 8.7* 8.5*  GFRNONAA 52*   < > >60   < > >60 49* 53*  PROT 7.1  --  6.6  --   --   --  6.3*  ALBUMIN 4.0  --  3.7  --   --   --  3.4*  AST 26  --  20  --   --   --  23  ALT  19  --  21  --   --   --  21  ALKPHOS 53  --  60  --   --   --  73  BILITOT 0.9  --  0.7  --   --   --  0.5   < > = values in this interval not displayed.    RADIOGRAPHIC STUDIES: I have personally reviewed the radiological images as listed and agreed with the findings in the report. No results found.  ASSESSMENT & PLAN:   Cancer of base of tongue = JULY-AUG 2024-Clinically SCC of left base of tongue-with left neck lymphadenopathy.  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 JULY 31st, 2024-  Left tongue base primary with ipsilateral level 2 nodal metastasis. A right-sided diminutive level 2 node is mildly hypermetabolic and suspicious for contralateral nodal metastasis.   No extracervical metastatic disease identified.  cisplatin weekly x 6-8; with concurrent RT [last oct 9th, 2024]  # Cycle 6 was held d/t elevated creatinine. He has completed radiation on 12/01/22. Labs reviewed today. Serum creatinine improved but persistently elevated from  baseline. Generally weak and poorly tolerating orals. Recommend holding treatment today. Reviewed with Dr Smith Robert who is in agreement. Hold IV fluids today (fluid shortage) and recommend oral rehydration and pain control (see below). Will have patient return next week for repeat labs, reevaluation with Dr Donneta Romberg and possible cisplatin. Unsure if Dr. Donneta Romberg would recommend additional chemotherapy now that he has completed radiation. Patient is in agreement.     # Oral Mucositis- G 2-3- sec to chemo-RT- recommend magic mouth wash. Continue salt/baking soda rinses- stable.    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 Discussed regarding low calcium.continue calcium 1200 plus vitamin D-3 1000-over-the-counter-2 pill a day.   SEP 2024- vitamin D levels-55. Creatinine 1.39- gfr 53. Baseline > 60. Improved from last week but persistently elevated. Encouraged hydration.    # Constipation: improved on Miralax/colace. Stable.    # Nausea- sec to cisplatin- G-2 continue anti-emetics [zofran/compazine]-; continue zyprexa. Which may improve appetite- Stable.    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 Stable.     # Gait instability- sec to BPPV- walks with cane/ monitor for neuropathy-Stable.     # Baseline weight:[240-250- July-AUG, 2024]s/p evaluation with  [employer] nutrition- Stable.    # Hypermetabolic focus within the prostate in the setting of prostatomegaly.-PSA level- 1- WNL [Aug 2024. ]   # IV access: s/p port placement-- functional-   # DISPOSITION: #  HOLD  Chemo tomorrow # RTC in 1 week for port/lab (cbc, cmp, mag, phos), Dr Donneta Romberg Following day +/- cisplatin- la  No problem-specific Assessment & Plan notes found for this encounter.  All questions were answered. The patient knows to call the clinic with any problems, questions or concerns.    Alinda Dooms, NP 12/07/2022   CC: Dr Donneta Romberg

## 2022-12-07 NOTE — Progress Notes (Signed)
Throat pain if trying to swallow solids is 8/10 pain.  Having trouble swallowing the calcium pill at times, not getting everyday.   Trouble swallowing, has approx. 6 ensure drinks a day, no appetite.   When will he have his last chemo?

## 2022-12-07 NOTE — Patient Instructions (Signed)
Oral Rehydration Solution (ORS) Recipes  Option 1 - 4  cups (1 liter) water +  teaspoon table salt + 6 level teaspoons sugar (World Health Organization's ORS recipe) - Provides: 1150 mg sodium, 25 grams sugar  Option 2 - 2 cups Gatorade + 2 cups water +  teaspoon salt - Provides: 1361 mg sodium, 24 grams sugar, 62 mg K  Option 3 - 3 cups water + 1 cup orange juice +  teaspoon salt +  teaspoon baking soda - Provides: 2355 mg sodium, 16 grams sugar, 362 mg K  Option 4 -  cup grape juice or cranberry juice + 3  cups water +  teaspoon salt - Provides: 1156-1159 mg sodium, 9-11 grams sugar, 50-90 mg K  Option 5 - 1 cup apple juice + 3 cups water +  teaspoon salt - Provides: 1160 mg sodium, 20 grams sugar, 194 mg K

## 2022-12-08 ENCOUNTER — Other Ambulatory Visit: Payer: Medicare Other

## 2022-12-08 ENCOUNTER — Ambulatory Visit: Payer: Medicare Other

## 2022-12-08 ENCOUNTER — Ambulatory Visit: Payer: Medicare Other | Admitting: Internal Medicine

## 2022-12-08 ENCOUNTER — Inpatient Hospital Stay: Payer: Medicare Other

## 2022-12-13 ENCOUNTER — Other Ambulatory Visit: Payer: Self-pay | Admitting: Internal Medicine

## 2022-12-14 ENCOUNTER — Other Ambulatory Visit: Payer: Medicare Other

## 2022-12-14 ENCOUNTER — Telehealth: Payer: Self-pay | Admitting: Internal Medicine

## 2022-12-14 ENCOUNTER — Inpatient Hospital Stay: Payer: Medicare Other | Admitting: Internal Medicine

## 2022-12-14 ENCOUNTER — Other Ambulatory Visit: Payer: Self-pay | Admitting: Internal Medicine

## 2022-12-14 DIAGNOSIS — C01 Malignant neoplasm of base of tongue: Secondary | ICD-10-CM

## 2022-12-14 MED FILL — Fosaprepitant Dimeglumine For IV Infusion 150 MG (Base Eq): INTRAVENOUS | Qty: 5 | Status: AC

## 2022-12-14 NOTE — Telephone Encounter (Signed)
error 

## 2022-12-14 NOTE — Progress Notes (Signed)
Will plan chemo to next week-; cancel chemo tomorrow  In 1 week- APP; labs- cbc/cmp;mag; D-2 cisplatin.   GB

## 2022-12-15 ENCOUNTER — Inpatient Hospital Stay: Payer: Medicare Other

## 2022-12-16 ENCOUNTER — Inpatient Hospital Stay: Payer: Medicare Other

## 2022-12-21 ENCOUNTER — Other Ambulatory Visit: Payer: Self-pay

## 2022-12-21 ENCOUNTER — Encounter: Payer: Self-pay | Admitting: Internal Medicine

## 2022-12-21 ENCOUNTER — Telehealth: Payer: Self-pay | Admitting: Internal Medicine

## 2022-12-21 ENCOUNTER — Inpatient Hospital Stay: Payer: Medicare Other | Admitting: Nurse Practitioner

## 2022-12-21 ENCOUNTER — Inpatient Hospital Stay: Payer: Medicare Other

## 2022-12-21 NOTE — Telephone Encounter (Signed)
Called patient to check to see if he was coming today-no answer left voicemail.  Called sister to see if she has spoke to him-  she states patient he was weak last week and didn't know what to do. I asked her to have him call if he doesn't feel good and we can try to get him in for [facility]. She is going to get in touch with him and have him call back.

## 2022-12-22 ENCOUNTER — Ambulatory Visit: Payer: Medicare Other

## 2022-12-22 ENCOUNTER — Other Ambulatory Visit: Payer: Self-pay

## 2022-12-28 ENCOUNTER — Other Ambulatory Visit: Payer: Self-pay

## 2022-12-31 ENCOUNTER — Inpatient Hospital Stay: Payer: Medicare Other | Attending: Internal Medicine

## 2022-12-31 DIAGNOSIS — C099 Malignant neoplasm of tonsil, unspecified: Secondary | ICD-10-CM | POA: Insufficient documentation

## 2022-12-31 DIAGNOSIS — R590 Localized enlarged lymph nodes: Secondary | ICD-10-CM | POA: Insufficient documentation

## 2022-12-31 DIAGNOSIS — K5900 Constipation, unspecified: Secondary | ICD-10-CM | POA: Insufficient documentation

## 2022-12-31 DIAGNOSIS — Z79899 Other long term (current) drug therapy: Secondary | ICD-10-CM | POA: Insufficient documentation

## 2022-12-31 DIAGNOSIS — K1230 Oral mucositis (ulcerative), unspecified: Secondary | ICD-10-CM | POA: Insufficient documentation

## 2022-12-31 DIAGNOSIS — C01 Malignant neoplasm of base of tongue: Secondary | ICD-10-CM | POA: Insufficient documentation

## 2022-12-31 NOTE — Progress Notes (Signed)
Nutrition  Unable to reach patient after 2 attempts for scheduled phone follow-up.  No option to leave voicemail for patient.   Kahleah Crass B. Freida Busman, RD, LDN Registered Dietitian [phone]

## 2023-01-06 ENCOUNTER — Other Ambulatory Visit: Payer: Self-pay | Admitting: *Deleted

## 2023-01-06 ENCOUNTER — Ambulatory Visit
Admission: RE | Admit: 2023-01-06 | Discharge: 2023-01-06 | Disposition: A | Discharge status: Home / Self Care | Payer: Medicare Other | Source: Ambulatory Visit | Attending: Radiation Oncology | Admitting: Radiation Oncology

## 2023-01-06 ENCOUNTER — Encounter: Payer: Self-pay | Admitting: Radiation Oncology

## 2023-01-06 VITALS — BP 137/75 | HR 76 | Temp 97.5°F | Resp 16 | Wt 222.0 lb

## 2023-01-06 DIAGNOSIS — Z9221 Personal history of antineoplastic chemotherapy: Secondary | ICD-10-CM | POA: Diagnosis not present

## 2023-01-06 DIAGNOSIS — C01 Malignant neoplasm of base of tongue: Secondary | ICD-10-CM | POA: Diagnosis not present

## 2023-01-06 DIAGNOSIS — Z923 Personal history of irradiation: Secondary | ICD-10-CM | POA: Insufficient documentation

## 2023-01-06 MED ORDER — FLUCONAZOLE 100 MG PO TABS
100.0000 mg | ORAL_TABLET | Freq: Every day | ORAL | 0 refills | Status: AC
Start: 2023-01-06 — End: 2023-01-20

## 2023-01-06 NOTE — Progress Notes (Signed)
Radiation Oncology Follow up Note  Name: Cory Hess   Date:   01/06/2023 MRN:  [id number] DOB: 02/06/1946    This 77 y.o. 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  REFERRING PROVIDER: No ref. provider found  HPI: The patient, a 77 year old with stage three (T3, N1, M0) squamous cell carcinoma of the base of tongue, presents one month after completing concurrent chemo radiation therapy. He reports feeling 'pretty good' with no pain and adequate swallowing. He has maintained his weight at 222 lbs. He has been gargling with diluted hydrogen peroxide, which he was advised to discontinue due to its caustic nature. He also reports a sore mouth, and the doctor suspects a fungal infection..  COMPLICATIONS OF TREATMENT: none  FOLLOW UP COMPLIANCE: keeps appointments   PHYSICAL EXAM:  BP 137/75   Pulse 76   Temp (!) 97.5 F (36.4 C) (Tympanic)   Resp 16   Wt 222 lb (100.7 kg)   BMI 30.11 kg/m  Oral cavity shows some oral candidiasis.  No oral mucositis is noted neck is clear without evidence of cervical adenopathy.  Well-developed well-nourished patient in NAD. HEENT reveals PERLA, EOMI, discs not visualized.  Oral cavity is clear. No oral mucosal lesions are identified. Neck is clear without evidence of cervical or supraclavicular adenopathy. Lungs are clear to A&P. Cardiac examination is essentially unremarkable with regular rate and rhythm without murmur rub or thrill. Abdomen is benign with no organomegaly or masses noted. Motor sensory and DTR levels are equal and symmetric in the upper and lower extremities. Cranial nerves II through XII are grossly intact. Proprioception is intact. No peripheral adenopathy or edema is identified. No motor or sensory levels are noted. Crude visual fields are within normal range.  RADIOLOGY RESULTS: CT scan of soft tissue head  and neck ordered for 3 months  PLAN: Stage [name] (T3, N1, M0) Squamous Cell Carcinoma of the Base of Tongue Completed concurrent chemo radiation therapy one month ago. No current complaints of pain or difficulty swallowing. Stable weight. -Order CT scan of head and neck prior to next visit in three months. -Continue follow-up with oncologist.  Oral Health Noted oral fungal infection and patient's inappropriate use of hydrogen peroxide for oral care. -Prescribe Diflucan for 10 days to treat oral fungal infection. -Prescribe Carafate for oral care; instruct patient to dissolve pill in water and swish in mouth. -Advise against use of hydrogen peroxide for oral care.    Carmina Miller, MD

## 2023-01-07 ENCOUNTER — Other Ambulatory Visit: Payer: Self-pay

## 2023-01-12 ENCOUNTER — Other Ambulatory Visit: Payer: Self-pay | Admitting: *Deleted

## 2023-01-12 DIAGNOSIS — C01 Malignant neoplasm of base of tongue: Secondary | ICD-10-CM

## 2023-01-14 ENCOUNTER — Ambulatory Visit: Payer: Medicare Other | Admitting: Internal Medicine

## 2023-01-14 ENCOUNTER — Other Ambulatory Visit: Payer: Medicare Other

## 2023-01-14 ENCOUNTER — Inpatient Hospital Stay: Payer: Medicare Other

## 2023-01-14 ENCOUNTER — Encounter: Payer: Self-pay | Admitting: Internal Medicine

## 2023-01-14 ENCOUNTER — Inpatient Hospital Stay (HOSPITAL_BASED_OUTPATIENT_CLINIC_OR_DEPARTMENT_OTHER): Payer: Medicare Other | Admitting: Internal Medicine

## 2023-01-14 ENCOUNTER — Ambulatory Visit: Payer: Medicare Other

## 2023-01-14 VITALS — BP 112/86 | HR 69 | Temp 97.4°F | Ht 72.0 in | Wt 224.0 lb

## 2023-01-14 DIAGNOSIS — Z79899 Other long term (current) drug therapy: Secondary | ICD-10-CM | POA: Diagnosis not present

## 2023-01-14 DIAGNOSIS — R590 Localized enlarged lymph nodes: Secondary | ICD-10-CM | POA: Diagnosis not present

## 2023-01-14 DIAGNOSIS — K1230 Oral mucositis (ulcerative), unspecified: Secondary | ICD-10-CM | POA: Diagnosis not present

## 2023-01-14 DIAGNOSIS — Z95828 Presence of other vascular implants and grafts: Secondary | ICD-10-CM

## 2023-01-14 DIAGNOSIS — C01 Malignant neoplasm of base of tongue: Secondary | ICD-10-CM

## 2023-01-14 DIAGNOSIS — K5900 Constipation, unspecified: Secondary | ICD-10-CM | POA: Diagnosis not present

## 2023-01-14 DIAGNOSIS — C099 Malignant neoplasm of tonsil, unspecified: Secondary | ICD-10-CM | POA: Diagnosis not present

## 2023-01-14 LAB — CMP (CANCER CENTER ONLY)
ALT: 34 U/L (ref 0–44)
AST: 28 U/L (ref 15–41)
Albumin: 4 g/dL (ref 3.5–5.0)
Alkaline Phosphatase: 59 U/L (ref 38–126)
Anion gap: 10 (ref 5–15)
BUN: 36 mg/dL — ABNORMAL HIGH (ref 8–23)
CO2: 25 mmol/L (ref 22–32)
Calcium: 9.1 mg/dL (ref 8.9–10.3)
Chloride: 103 mmol/L (ref 98–111)
Creatinine: 1.56 mg/dL — ABNORMAL HIGH (ref 0.61–1.24)
GFR, Estimated: 46 mL/min — ABNORMAL LOW (ref >60)
Glucose, Bld: 106 mg/dL — ABNORMAL HIGH (ref 70–99)
Potassium: 5 mmol/L (ref 3.5–5.1)
Sodium: 138 mmol/L (ref 135–145)
Total Bilirubin: 0.9 mg/dL (ref <1.2)
Total Protein: 6.6 g/dL (ref 6.5–8.1)

## 2023-01-14 LAB — CBC WITH DIFFERENTIAL (CANCER CENTER ONLY)
Abs Immature Granulocytes: 0.02 10*3/uL (ref 0.00–0.07)
Basophils Absolute: 0 10*3/uL (ref 0.0–0.1)
Basophils Relative: 1 %
Eosinophils Absolute: 0.4 10*3/uL (ref 0.0–0.5)
Eosinophils Relative: 7 %
HCT: 30.9 % — ABNORMAL LOW (ref 39.0–52.0)
Hemoglobin: 10.3 g/dL — ABNORMAL LOW (ref 13.0–17.0)
Immature Granulocytes: 0 %
Lymphocytes Relative: 16 %
Lymphs Abs: 1 10*3/uL (ref 0.7–4.0)
MCH: 34.3 pg — ABNORMAL HIGH (ref 26.0–34.0)
MCHC: 33.3 g/dL (ref 30.0–36.0)
MCV: 103 fL — ABNORMAL HIGH (ref 80.0–100.0)
Monocytes Absolute: 0.7 10*3/uL (ref 0.1–1.0)
Monocytes Relative: 12 %
Neutro Abs: 4.1 10*3/uL (ref 1.7–7.7)
Neutrophils Relative %: 64 %
Platelet Count: 115 10*3/uL — ABNORMAL LOW (ref 150–400)
RBC: 3 MIL/uL — ABNORMAL LOW (ref 4.22–5.81)
RDW: 14.2 % (ref 11.5–15.5)
WBC Count: 6.3 10*3/uL (ref 4.0–10.5)
nRBC: 0 % (ref 0.0–0.2)

## 2023-01-14 LAB — PHOSPHORUS: Phosphorus: 3.7 mg/dL (ref 2.5–4.6)

## 2023-01-14 LAB — MAGNESIUM: Magnesium: 2.3 mg/dL (ref 1.7–2.4)

## 2023-01-14 MED ORDER — HEPARIN SOD (PORK) LOCK FLUSH 100 UNIT/ML IV SOLN
500.0000 [IU] | Freq: Once | INTRAVENOUS | Status: AC
  Administered 2023-01-14: 500 [IU] via INTRAVENOUS
  Filled 2023-01-14: qty 5

## 2023-01-14 NOTE — Assessment & Plan Note (Addendum)
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 JULY 31st, 2024-  Left tongue base primary with ipsilateral level 2 nodal metastasis. A right-sided diminutive level 2 node is mildly hypermetabolic and suspicious for contralateral nodal metastasis.   No extracervical metastatic disease identified.  cisplatin weekly x6  with concurrent RT [last NOV 9th, 2024]  # awaiting CT scan in FEB 2024 [with rad-Onc]; will consider PET down the line.   # Oral Mucositis- G 1-2 sec to chemo-RT- recommend magic mouth wash. Continue salt/baking soda rinses- stable.   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 Discussed regarding low calcium.continue calcium 1200 plus vitamin D-3 1000-over-the-counter-2 pill a day.   SEP 2024- vitamin D levels-55. Recommend increasing electrolyte intake.   # Constipation: improved on Miralax/colace. Stable.   # Nausea- sec to cisplatin- G-2 continue anti-emetics [zofran/compazine]-; continue zyprexa.- Stable.   # CAD s/p CABG [2019; Dr.End]- AUg 2024-2 D echo- 55-60%.Stable.     # Gait instability- sec to BPPV- walks with cane/ monitor for neuropathy-Stable.    # Baseline weight:[240-250- July-AUG, 2024]s/p evaluation with  [facility] nutrition- Stable.   # Hypermetabolic focus within the prostate in the setting of prostatomegaly.-PSA level- 1- WNL [Aug 2024. ]  # IV access: s/p port placement-- functional- CT with rad-onc in Feb  # DISPOSITION: # follow up in 6  week-MD-;port- labs- cbc/cmp;mag;  phos; possible 1 lit IVFs over 1 hour-Dr.B

## 2023-01-14 NOTE — Progress Notes (Signed)
Dr. Aggie Cosier started on diflucan.  Trouble sleeping,  using melatonin, seems to be helping.  No appetite, drinks 6-7 equate protein drinks per day.

## 2023-01-14 NOTE — Progress Notes (Signed)
[facility] CONSULT NOTE  Patient Care Team: Patient, No Pcp Per as PCP - General (General Practice) End, Cristal Deer, MD as PCP - Cardiology (Cardiology) Carmina Miller, MD as Consulting Physician (Radiation Oncology) Earna Coder, MD as Consulting Physician (Oncology)  CHIEF COMPLAINTS/PURPOSE OF CONSULTATION: head and neck cancer  #  Oncology History Overview Note  JULY, 2024- CT scan:  1. 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 2. Metastatic lymphadenopathy at the level 2 station on the left measuring 2.7 x 2.5 x 2.1 cm. 3. Atherosclerotic calcification of the carotid bifurcations.  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   Alone.   Cory Hess 77 y.o.  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  Patient finished RT few weeks ago.   Dr. Aggie Cosier started on diflucan.   Trouble sleeping,  using melatonin, seems to be helping.   Poor  appetite, drink 2-2.5 ensure a day. Having trouble swallowing solids and liquids.  Nausea improved since using Zyprexa.   Constipation, laxative and softner 2-3 times a day.    Review of Systems  Constitutional:  Positive for malaise/fatigue and weight loss. Negative for chills, diaphoresis and fever.  HENT:  Negative for nosebleeds and sore throat.   Eyes:   Negative for double vision.  Respiratory:  Negative for cough, hemoptysis, sputum production, shortness of breath and wheezing.   Cardiovascular:  Negative for chest pain, palpitations, orthopnea and leg swelling.  Gastrointestinal:  Positive for nausea. Negative for abdominal pain, blood in stool, constipation, diarrhea, heartburn, melena and vomiting.  Genitourinary:  Negative for dysuria, frequency and urgency.  Musculoskeletal:  Positive for back pain and joint pain.  Skin: Negative.  Negative for itching and rash.  Neurological:  Negative for dizziness, tingling, focal weakness, weakness and headaches.  Endo/Heme/Allergies:  Does not bruise/bleed easily.  Psychiatric/Behavioral:  Negative for depression. The patient is not nervous/anxious and does not have insomnia.      MEDICAL HISTORY:  Past Medical History:  Diagnosis Date   Alternating RBBB & LBBB    Ankle swelling 2024   Arthritis    neck   Bladder tumor    CAD (coronary artery disease)    a. 08/2017 STEMI/Cath: LM 80ost/d, LAD 100p, LCX 95 ost/p, RCA 60p, 38m, RPDA 80ost; b. 08/2017 CABG x 3: LIMA->LAD, VG->OM, VG->PDA.   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 08/2017 TEE: EF 30-35%, sept/ant/inf HK, apical AK. 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 12/2017 Echo: EF 45-50%, diff HK. Gr1 DD. Mildly reduced RV fxn. Mild RAE; c. 09/2022 Echo: EF 55-60%, mod LVH, GrI DD, nl RV fxn, mildly dil RA, mild MR.   Hyperlipidemia LDL  goal <70    Ischemic cardiomyopathy    a. 08/2017 TEE: EF 30-35%; b. 12/2017 Echo: EF 45-50%; c. 09/2022 Echo: EF 55-60%.   Myocardial infarction (HCC) 08/27/2017   Shingles    2018 - right side   Vertigo    several months ago    SURGICAL HISTORY: Past Surgical History:  Procedure Laterality Date   CARDIAC CATHETERIZATION     CATARACT EXTRACTION W/PHACO Left 07/18/2018   Procedure: CATARACT  EXTRACTION PHACO AND INTRAOCULAR LENS PLACEMENT (IOC)  LEFT;  Surgeon: Nevada Crane, MD;  Location: [facility];  Service: Ophthalmology;  Laterality: Left;   CATARACT EXTRACTION W/PHACO Right 08/14/2018   Procedure: CATARACT EXTRACTION PHACO AND INTRAOCULAR LENS PLACEMENT (IOC)  RIGHT;  Surgeon: Nevada Crane, MD;  Location: [facility];  Service: Ophthalmology;  Laterality: Right;   CORONARY ARTERY BYPASS GRAFT N/A 08/27/2017   Procedure: CORONARY ARTERY BYPASS GRAFTING (CABG) ON PUMP USING LEFT INTERNAL MAMMARY ARTERY AND LEFT GREATER SAPHENOUS VEIN VIA ENDOVEIN HARVEST;  Surgeon: Alleen Borne, MD;  Location: [facility] OR;  Service: Open Heart Surgery;  Laterality: N/A;   CORONARY/GRAFT ACUTE MI REVASCULARIZATION N/A 08/27/2017   Procedure: Coronary/Graft Acute MI Revascularization;  Surgeon: Iran Ouch, MD;  Location: [facility];  Service: Cardiovascular;  Laterality: N/A;   CYSTOSCOPY W/ RETROGRADES Bilateral 12/25/2018   Procedure: CYSTOSCOPY WITH RETROGRADE PYELOGRAM;  Surgeon: Vanna Scotland, MD;  Location: [facility] ORS;  Service: Urology;  Laterality: Bilateral;   FRACTURE SURGERY Right 1958   arm and left wrist compound fracture, no metal   HERNIA REPAIR Right    inguinial   IR IMAGING GUIDED PORT INSERTION  10/15/2022   LEFT HEART CATH AND CORONARY ANGIOGRAPHY N/A 08/27/2017   Procedure: LEFT HEART CATH AND CORONARY ANGIOGRAPHY;  Surgeon: Iran Ouch, MD;  Location: [facility] INVAS[facility] CV LAB;  Service: Cardiovascular;  Laterality: N/A;   TRANSURETHRAL RESECTION OF BLADDER TUMOR WITH MITOMYCIN-C N/A 12/25/2018   Procedure: TRANSURETHRAL RESECTION OF BLADDER TUMOR WITH Gemcitabine;  Surgeon: Vanna Scotland, MD;  Location: [facility] ORS;  Service: Urology;  Laterality: N/A;    SOCIAL HISTORY: Social History   Socioeconomic History   Marital status: Single    Spouse name: Not on file   Number of children: Not on file   Years of education: Not on file    Highest education level: Not on file  Occupational History   Occupation: supervision, Research scientist (life sciences)    Comment: retired  Tobacco Use   Smoking status: Former    Current packs/day: 0.00    Types: Cigarettes    Quit date: 2000    Years since quitting: 24.9   Smokeless tobacco: Current    Types: Snuff  Vaping Use   Vaping status: Never Used  Substance and Sexual Activity   Alcohol use: Not Currently   Drug use: Not Currently   Sexual activity: Not on file  Other Topics Concern   Not on file  Social History Narrative   Lives alone   Social Determinants of Health   Financial Resource Strain: Low Risk  (11/06/2018)   Overall Financial Resource Strain (CARDIA)    Difficulty of Paying Living Expenses: Not hard at all  Food Insecurity: No Food Insecurity (09/10/2022)   Hunger Vital Sign    Worried About Running Out of Food in the Last Year: Never true    Ran Out of Food in the Last Year: Never true  Transportation Needs: Not on file (09/07/2022)  Physical  Activity: Inactive (11/06/2018)   Exercise Vital Sign    Days of Exercise per Week: 0 days    Minutes of Exercise per Session: 0 min  Stress: No Stress Concern Present (11/06/2018)   [employer] of Occupational Health - Occupational Stress Questionnaire    Feeling of Stress : Not at all  Social Connections: Socially Isolated (11/06/2018)   Social Connection and Isolation Panel [NHANES]    Frequency of Communication with Friends and Family: Once a week    Frequency of Social Gatherings with Friends and Family: Once a week    Attends Religious Services: Never    Database administrator or Organizations: No    Attends Banker Meetings: Never    Marital Status: Never married  Intimate Partner Violence: Not At Risk (09/10/2022)   Humiliation, Afraid, Rape, and Kick questionnaire    Fear of Current or Ex-Partner: No    Emotionally Abused: No    Physically Abused: No    Sexually Abused: No    FAMILY  HISTORY: Family History  Problem Relation Age of Onset   Heart failure Mother    Lung disease Father    Stroke Father    Cervical cancer Maternal Grandmother     ALLERGIES:  has No Known Allergies.  MEDICATIONS:  Current Outpatient Medications  Medication Sig Dispense Refill   acetaminophen (TYLENOL) 650 MG CR tablet Take 650 mg by mouth every 8 (eight) hours as needed for pain.     aspirin EC 81 MG EC tablet Take 1 tablet (81 mg total) by mouth daily.     atorvastatin (LIPITOR) 80 MG tablet Take 1 tablet by mouth once daily 90 tablet 0   carvedilol (COREG) 12.5 MG tablet Take 1 tablet (12.5 mg total) by mouth 2 (two) times daily with a meal. 180 tablet 0   fluconazole (DIFLUCAN) 100 MG tablet Take 1 tablet (100 mg total) by mouth daily for 14 days. 14 tablet 0   lidocaine-prilocaine (EMLA) cream Apply on the port. 30 -45 min  prior to port access. 30 g 3   losartan (COZAAR) 100 MG tablet Take 1 tablet (100 mg total) by mouth daily. 90 tablet 0   magic mouthwash (multi-ingredient) oral suspension Swish and spit 5-10 mLs 4 (four) times daily as needed. 480 mL 1   Multiple Vitamin (MULTIVITAMIN WITH MINERALS) TABS tablet Take 1 tablet by mouth daily. One a Day over 50/ lunch     nitroGLYCERIN (NITROSTAT) 0.4 MG SL tablet DISSOLVE ONE TABLET UNDER THE TONGUE EVERY 5 MINUTES AS NEEDED FOR CHEST PAIN.  DO NOT EXCEED A TOTAL OF 3 DOSES IN 15 MINUTES 25 tablet 0   OLANZapine (ZYPREXA) 5 MG tablet Take one pill at night. 30 tablet 0   ondansetron (ZOFRAN) 8 MG tablet One pill every 8 hours as needed for nausea/vomitting. 40 tablet 1   polyethylene glycol (MIRALAX) 17 g packet Take 17 g by mouth daily. 30 each 0   prochlorperazine (COMPAZINE) 10 MG tablet Take 1 tablet (10 mg total) by mouth every 6 (six) hours as needed for nausea or vomiting. 40 tablet 1   senna-docusate (SENOKOT-S) 8.6-50 MG tablet Take 1 tablet by mouth daily. 30 tablet 0   sucralfate (CARAFATE) 1 g tablet Take 1 tablet (1 g  total) by mouth 4 (four) times daily -  with meals and at bedtime. Dissolve in 4 tablespoons warm water 30 minutes before meals. 90 tablet 1   torsemide (DEMADEX) 20 MG tablet Take  1 tablet (20 mg total) by mouth daily as needed (swelling and weight gain). 30 tablet 3   traMADol HCl 100 MG TABS Take 100 mg by mouth every 8 (eight) hours as needed. 30 tablet 0   No current facility-administered medications for this visit.   Facility-Administered Medications Ordered in Other Visits  Medication Dose Route Frequency Provider Last Rate Last Admin   sodium chloride flush (NS) 0.9 % injection 10 mL  10 mL Intravenous PRN Louretta Shorten R, MD       sodium chloride flush (NS) 0.9 % injection 10 mL  10 mL Intravenous PRN Louretta Shorten R, MD   10 mL at 11/30/22 1510     PHYSICAL EXAMINATION:   Vitals:   01/14/23 0921  BP: 112/86  Pulse: 69  Temp: (!) 97.4 F (36.3 C)  SpO2: 99%    Filed Weights   01/14/23 0921  Weight: 224 lb (101.6 kg)   Prior Left neck 2 cm submandibular LN - resolved.   Physical Exam Vitals and nursing note reviewed.  HENT:     Head: Normocephalic and atraumatic.     Mouth/Throat:     Pharynx: Oropharynx is clear.  Eyes:     Extraocular Movements: Extraocular movements intact.     Pupils: Pupils are equal, round, and reactive to light.  Cardiovascular:     Rate and Rhythm: Normal rate and regular rhythm.  Pulmonary:     Comments: Decreased breath sounds bilaterally.  Abdominal:     Palpations: Abdomen is soft.  Musculoskeletal:        General: Normal range of motion.     Cervical back: Normal range of motion.  Skin:    General: Skin is warm.  Neurological:     General: No focal deficit present.     Mental Status: He is alert and oriented to person, place, and time.  Psychiatric:        Behavior: Behavior normal.        Judgment: Judgment normal.      LABORATORY DATA:  I have reviewed the data as listed Lab Results  Component Value  Date   WBC 6.3 01/14/2023   HGB 10.3 (L) 01/14/2023   HCT 30.9 (L) 01/14/2023   MCV 103.0 (H) 01/14/2023   PLT 115 (L) 01/14/2023   Recent Labs    11/03/22 1309 11/10/22 1412 11/30/22 1511 12/07/22 0800 01/14/23 0927  NA 132*   < > 139 135 138  K 4.1   < > 4.7 4.5 5.0  CL 101   < > 104 107 103  CO2 26   < > 25 24 25   GLUCOSE 115*   < > 107* 111* 106*  BUN 19   < > 38* 25* 36*  CREATININE 1.08   < > 1.48* 1.39* 1.56*  CALCIUM 8.6*   < > 8.7* 8.5* 9.1  GFRNONAA >60   < > 49* 53* 46*  PROT 6.6  --   --  6.3* 6.6  ALBUMIN 3.7  --   --  3.4* 4.0  AST 20  --   --  23 28  ALT 21  --   --  21 34  ALKPHOS 60  --   --  73 59  BILITOT 0.7  --   --  0.5 0.9   < > = values in this interval not displayed.    RADIOGRAPHIC STUDIES: I have personally reviewed the radiological images as listed and agreed with the findings in the  report. No results found.  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 JULY 31st, 2024-  Left tongue base primary with ipsilateral level 2 nodal metastasis. A right-sided diminutive level 2 node is mildly hypermetabolic and suspicious for contralateral nodal metastasis.   No extracervical metastatic disease identified.  cisplatin weekly x6  with concurrent RT [last NOV 9th, 2024]  # awaiting CT scan in FEB 2024 [with rad-Onc]; will consider PET down the line.   # Oral Mucositis- G 1-2 sec to chemo-RT- recommend magic mouth wash. Continue salt/baking soda rinses- stable.   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 Discussed regarding low calcium.continue calcium 1200 plus vitamin D-3 1000-over-the-counter-2 pill a day.   SEP 2024- vitamin D levels-55. Recommend increasing electrolyte intake.   #  Constipation: improved on Miralax/colace. Stable.   # Nausea- sec to cisplatin- G-2 continue anti-emetics [zofran/compazine]-; continue zyprexa.- Stable.   # CAD s/p CABG [2019; Dr.End]- AUg 2024-2 D echo- 55-60%.Stable.     # Gait instability- sec to BPPV- walks with cane/ monitor for neuropathy-Stable.    # Baseline weight:[240-250- July-AUG, 2024]s/p evaluation with  [facility] nutrition- Stable.   # Hypermetabolic focus within the prostate in the setting of prostatomegaly.-PSA level- 1- WNL [Aug 2024. ]  # IV access: s/p port placement-- functional- CT with rad-onc in Feb  # DISPOSITION: # follow up in 6  week-MD-;port- labs- cbc/cmp;mag;  phos; possible 1 lit IVFs over 1 hour-Dr.B     All questions were answered. The patient knows to call the clinic with any problems, questions or concerns.    Earna Coder, MD 01/14/2023 10:12 AM

## 2023-01-24 ENCOUNTER — Other Ambulatory Visit (HOSPITAL_BASED_OUTPATIENT_CLINIC_OR_DEPARTMENT_OTHER): Payer: Self-pay

## 2023-01-24 ENCOUNTER — Encounter: Payer: Self-pay | Admitting: Internal Medicine

## 2023-02-08 ENCOUNTER — Other Ambulatory Visit: Payer: Self-pay | Admitting: Nurse Practitioner

## 2023-02-25 ENCOUNTER — Inpatient Hospital Stay: Payer: Medicare Other | Attending: Internal Medicine

## 2023-02-25 ENCOUNTER — Inpatient Hospital Stay: Payer: Medicare Other | Admitting: Nurse Practitioner

## 2023-02-25 ENCOUNTER — Inpatient Hospital Stay: Payer: Medicare Other

## 2023-02-25 ENCOUNTER — Ambulatory Visit: Payer: Medicare Other

## 2023-02-25 NOTE — Progress Notes (Signed)
 Nutrition  No show for scheduled appointments today. Scheduling has already left voicemail to reschedule.  Zackrey Dyar B. Freida Busman, RD, LDN Registered Dietitian [phone]

## 2023-03-02 ENCOUNTER — Ambulatory Visit: Payer: Medicare Other | Attending: Nurse Practitioner | Admitting: Nurse Practitioner

## 2023-03-02 NOTE — Progress Notes (Deleted)
 Office Visit    Patient Name: Cory Hess Date of Encounter: 03/02/2023  Primary Care Provider:  Patient, No Pcp Per Primary Cardiologist:  Cory Hanson, Hess  Chief Complaint    78 y.o. male with history of CAD status post CABG x 3 in July 2018, hypertension, hyperlipidemia, alternating bundle branch block, ischemic cardiomyopathy, and HFrEF, who presents for CAD and heart failure follow-up.   Past Medical History  Subjective   Past Medical History:  Diagnosis Date   Alternating RBBB & LBBB    Ankle swelling 2024   Arthritis    neck   Bladder tumor    CAD (coronary artery disease)    a. 08/2017 STEMI/Cath: LM 80ost/d, LAD 100p, LCX 95 ost/p, RCA 60p, 25m, RPDA 80ost; b. 08/2017 CABG x 3: LIMA->LAD, VG->OM, VG->PDA.   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 08/2017 TEE: EF 30-35%, sept/ant/inf HK, apical AK. 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 12/2017 Echo: EF 45-50%, diff HK. Gr1 DD. Mildly reduced RV fxn. Mild RAE; c. 09/2022 Echo: EF 55-60%, mod LVH, GrI DD, nl RV fxn, mildly dil RA, mild MR.   Hyperlipidemia LDL goal <70    Ischemic cardiomyopathy    a. 08/2017 TEE: EF 30-35%; b. 12/2017 Echo: EF 45-50%; c. 09/2022 Echo: EF 55-60%.   Myocardial infarction (HCC) 08/27/2017   Shingles    2018 - right side   Vertigo    several months ago   Past Surgical History:  Procedure Laterality Date   CARDIAC CATHETERIZATION     CATARACT EXTRACTION W/PHACO Left 07/18/2018   Procedure: CATARACT EXTRACTION PHACO AND INTRAOCULAR LENS PLACEMENT (IOC)  LEFT;  Surgeon: Cory Adine Anes, Hess;  Location: [facility];  Service: Ophthalmology;  Laterality: Left;   CATARACT EXTRACTION W/PHACO Right 08/14/2018   Procedure: CATARACT EXTRACTION PHACO AND INTRAOCULAR LENS PLACEMENT (IOC)  RIGHT;  Surgeon: Cory Adine Anes, Hess;  Location: [facility];  Service:  Ophthalmology;  Laterality: Right;   CORONARY ARTERY BYPASS GRAFT N/A 08/27/2017   Procedure: CORONARY ARTERY BYPASS GRAFTING (CABG) ON PUMP USING LEFT INTERNAL MAMMARY ARTERY AND LEFT GREATER SAPHENOUS VEIN VIA ENDOVEIN HARVEST;  Surgeon: Cory Dorise POUR, Hess;  Location: [facility] OR;  Service: Open Heart Surgery;  Laterality: N/A;   CORONARY/GRAFT ACUTE MI REVASCULARIZATION N/A 08/27/2017   Procedure: Coronary/Graft Acute MI Revascularization;  Surgeon: Cory Hess;  Location: [facility] IN[facility];  Service: Cardiovascular;  Laterality: N/A;   CYSTOSCOPY W/ RETROGRADES Bilateral 12/25/2018   Procedure: CYSTOSCOPY WITH RETROGRADE PYELOGRAM;  Surgeon: Cory Hess;  Location: [facility] ORS;  Service: Urology;  Laterality: Bilateral;   FRACTURE SURGERY Right 1958   arm and left wrist compound fracture, no metal   HERNIA REPAIR Right    inguinial   IR IMAGING GUIDED PORT INSERTION  10/15/2022   LEFT HEART CATH AND CORONARY ANGIOGRAPHY N/A 08/27/2017   Procedure: LEFT HEART CATH AND CORONARY ANGIOGRAPHY;  Surgeon: Cory Hess;  Location: [facility];  Service: Cardiovascular;  Laterality: N/A;   TRANSURETHRAL RESECTION OF BLADDER TUMOR WITH MITOMYCIN -C N/A 12/25/2018   Procedure: TRANSURETHRAL RESECTION OF BLADDER TUMOR WITH Gemcitabine ;  Surgeon: Cory Hess;  Location: [facility] ORS;  Service: Urology;  Laterality: N/A;    Allergies  No Known Allergies    History of Present Illness  78 y.o. y/o male  with above past medical history including hypertension, hyperlipidemia, CAD, alternating bundle branch block, and ischemic cardiomyopathy/HFrEF.  In July 2019, he was admitted with chest pain and new left bundle branch block.  Catheterization showed multivessel CAD and following intra-aortic balloon pump placement, he was transferred to [facility] underwent CABG x 3.  Intraoperative TEE showed an EF of 30 to 35%.  Follow-up echo November 2019, showed improvement in LV function  to 45-50%.  He was maintained on Plavix  following his CABG however, this was discontinued in September 2020 in the setting of hematuria and finding of bladder tumor.  He subsequently underwent TURBT in November 2020.  Most recent echocardiogram in August 2024 showed EF of 55 to 60% with moderate LVH and grade 1 diastolic dysfunction.  No significant valvular disease was noted.    In August 2024, [name] required a short course of torsemide  therapy in the setting of lower extremity edema.  He eventually stopped torsemide  due to frequent urination and difficulty lying still while undergoing radiation to his tongue/neck.  A follow-up echo in August 2024 showed improvement in EF to 55-60% with moderate LVH and grade 1 diastolic dysfunction.  At office follow-up in October 2024, weight was down 19 pounds in the setting of poor p.o. intake.  Subsequent lab work showed elevation in creatinine above baseline with his most recent BUN and creatinine at 36 and 1.56 on January 14, 2023. Objective  Home Medications    Current Outpatient Medications  Medication Sig Dispense Refill   acetaminophen  (TYLENOL ) 650 MG CR tablet Take 650 mg by mouth every 8 (eight) hours as needed for pain.     aspirin  EC 81 MG EC tablet Take 1 tablet (81 mg total) by mouth daily.     atorvastatin  (LIPITOR ) 80 MG tablet Take 1 tablet by mouth once daily 90 tablet 0   carvedilol  (COREG ) 12.5 MG tablet Take 1 tablet (12.5 mg total) by mouth 2 (two) times daily with a meal. 180 tablet 0   lidocaine -prilocaine  (EMLA ) cream Apply on the port. 30 -45 min  prior to port access. 30 g 3   losartan  (COZAAR ) 100 MG tablet Take 1 tablet (100 mg total) by mouth daily. 90 tablet 0   magic mouthwash (multi-ingredient) oral suspension Swish and spit 5-10 mLs 4 (four) times daily as needed. 480 mL 1   Multiple Vitamin (MULTIVITAMIN WITH MINERALS) TABS tablet Take 1 tablet by mouth daily. One a Day over 50/ lunch     nitroGLYCERIN  (NITROSTAT ) 0.4 MG  SL tablet DISSOLVE ONE TABLET UNDER THE TONGUE EVERY 5 MINUTES AS NEEDED FOR CHEST PAIN.  DO NOT EXCEED A TOTAL OF 3 DOSES IN 15 MINUTES 25 tablet 0   OLANZapine  (ZYPREXA ) 5 MG tablet Take one pill at night. 30 tablet 0   ondansetron  (ZOFRAN ) 8 MG tablet One pill every 8 hours as needed for nausea/vomitting. 40 tablet 1   polyethylene glycol (MIRALAX ) 17 g packet Take 17 g by mouth daily. 30 each 0   prochlorperazine  (COMPAZINE ) 10 MG tablet Take 1 tablet (10 mg total) by mouth every 6 (six) hours as needed for nausea or vomiting. 40 tablet 1   senna-docusate (SENOKOT-S) 8.6-50 MG tablet Take 1 tablet by mouth daily. 30 tablet 0   sucralfate  (CARAFATE ) 1 g tablet Take 1 tablet (1 g total) by mouth 4 (four) times daily -  with meals and at bedtime. Dissolve in 4 tablespoons warm water  30 minutes before meals.  90 tablet 1   torsemide  (DEMADEX ) 20 MG tablet Take 1 tablet (20 mg total) by mouth daily as needed (swelling and weight gain). 30 tablet 3   traMADol  HCl 100 MG TABS Take 100 mg by mouth every 8 (eight) hours as needed. 30 tablet 0   No current facility-administered medications for this visit.   Facility-Administered Medications Ordered in Other Visits  Medication Dose Route Frequency Provider Last Rate Last Admin   sodium chloride  flush (NS) 0.9 % injection 10 mL  10 mL Intravenous PRN Brahmanday, Govinda R, Hess       sodium chloride  flush (NS) 0.9 % injection 10 mL  10 mL Intravenous PRN Brahmanday, Govinda R, Hess   10 mL at 11/30/22 1510     Physical Exam    VS:  There were no vitals taken for this visit. , BMI There is no height or weight on file to calculate BMI.       GEN: Well nourished, well developed, in no acute distress. HEENT: normal. Neck: Supple, no JVD, carotid bruits, or masses. Cardiac: RRR, no murmurs, rubs, or gallops. No clubbing, cyanosis, edema.  Radials 2+/PT 2+ and equal bilaterally.  Respiratory:  Respirations regular and unlabored, clear to auscultation  bilaterally. GI: Soft, nontender, nondistended, BS + x 4. MS: no deformity or atrophy. Skin: warm and dry, no rash. Neuro:  Strength and sensation are intact. Psych: Normal affect.  Accessory Clinical Findings    ECG personally reviewed by me today -    *** - no acute changes.  Lab Results  Component Value Date   WBC 6.3 01/14/2023   HGB 10.3 (L) 01/14/2023   HCT 30.9 (L) 01/14/2023   MCV 103.0 (H) 01/14/2023   PLT 115 (L) 01/14/2023   Lab Results  Component Value Date   CREATININE 1.56 (H) 01/14/2023   BUN 36 (H) 01/14/2023   NA 138 01/14/2023   K 5.0 01/14/2023   CL 103 01/14/2023   CO2 25 01/14/2023   Lab Results  Component Value Date   ALT 34 01/14/2023   AST 28 01/14/2023   ALKPHOS 59 01/14/2023   BILITOT 0.9 01/14/2023   Lab Results  Component Value Date   CHOL 93 (L) 04/01/2021   HDL 30 (L) 04/01/2021   LDLCALC 45 04/01/2021   LDLDIRECT 58 12/28/2017   TRIG 94 04/01/2021   CHOLHDL 3.1 04/01/2021    Lab Results  Component Value Date   HGBA1C 5.6 08/28/2017   No results found for: TSH     Assessment & Plan    1.  ***  Cory Meager, NP 03/02/2023, 9:37 AM

## 2023-03-09 ENCOUNTER — Encounter: Payer: Self-pay | Admitting: Internal Medicine

## 2023-03-09 ENCOUNTER — Telehealth: Payer: Self-pay | Admitting: Internal Medicine

## 2023-03-09 ENCOUNTER — Other Ambulatory Visit: Payer: Self-pay | Admitting: Internal Medicine

## 2023-03-09 NOTE — Telephone Encounter (Signed)
*  STAT* If patient is at the pharmacy, call can be transferred to refill team.   1. Which medications need to be refilled? (please list name of each medication and dose if known)   atorvastatin  (LIPITOR ) 80 MG tablet   carvedilol  (COREG ) 12.5 MG tablet   losartan  (COZAAR ) 100 MG tablet   2. Which pharmacy/location (including street and city if local pharmacy) is medication to be sent to? [id number] - [place], [place] - [address] [place] [address]   3. Do they need a 30 day or 90 day supply? 90

## 2023-03-09 NOTE — Telephone Encounter (Signed)
 Please schedule upcoming appt with Dr. Nolan Battle or an App.   Thanks!

## 2023-03-09 NOTE — Telephone Encounter (Signed)
 Good Morning,   Could you schedule this patient a 3 month follow up appointment? The patient was last seen by Rodgers Clack on 11-25-2022. Thank you so much.

## 2023-03-14 ENCOUNTER — Encounter: Payer: Self-pay | Admitting: Internal Medicine

## 2023-03-16 ENCOUNTER — Ambulatory Visit: Payer: Medicare Other | Admitting: Nurse Practitioner

## 2023-03-18 ENCOUNTER — Other Ambulatory Visit: Payer: Self-pay

## 2023-03-24 ENCOUNTER — Other Ambulatory Visit: Payer: Self-pay | Admitting: Nurse Practitioner

## 2023-03-26 DIAGNOSIS — L02413 Cutaneous abscess of right upper limb: Secondary | ICD-10-CM

## 2023-03-26 HISTORY — DX: Cutaneous abscess of right upper limb: L02.413

## 2023-03-29 ENCOUNTER — Ambulatory Visit
Admission: EM | Admit: 2023-03-29 | Discharge: 2023-03-29 | Disposition: A | Discharge status: Home / Self Care | Payer: Medicare Other | Attending: Emergency Medicine | Admitting: Emergency Medicine

## 2023-03-29 DIAGNOSIS — N390 Urinary tract infection, site not specified: Secondary | ICD-10-CM

## 2023-03-29 LAB — URINALYSIS, W/ REFLEX TO CULTURE (INFECTION SUSPECTED)
Bilirubin Urine: NEGATIVE
Glucose, UA: NEGATIVE mg/dL
Hgb urine dipstick: NEGATIVE
Nitrite: NEGATIVE
Protein, ur: 100 mg/dL — AB
Specific Gravity, Urine: 1.02 (ref 1.005–1.030)
WBC, UA: >50 WBC/hpf (ref 0–5)
pH: 5 (ref 5.0–8.0)

## 2023-03-29 MED ORDER — SULFAMETHOXAZOLE-TRIMETHOPRIM 800-160 MG PO TABS: 1.0000 | ORAL_TABLET | Freq: Two times a day (BID) | ORAL | 0 refills | Status: DC

## 2023-03-29 NOTE — ED Triage Notes (Signed)
Pt c/o urinary freq x5 days. Denies any hematuria.

## 2023-03-29 NOTE — Discharge Instructions (Addendum)
 Take the Bactrim  DS twice daily for 10 days with a full glass of water  for treatment of urinary tract infection.  Increase your oral fluid intake so that you increase your urine production and or flushing your urinary system.  Take an over-the-counter probiotic, such as Culturelle-Align-Activia, 1 hour after each dose of antibiotic to prevent diarrhea or yeast infections from forming.  We will culture urine and change the antibiotics if necessary.  Return for reevaluation, or see your primary care provider, for any new or worsening symptoms.

## 2023-03-29 NOTE — ED Provider Notes (Signed)
 [facility]    CSN: [id number] Arrival date & time: 03/29/23  1157      History   Chief Complaint Chief Complaint  Patient presents with   Urinary Frequency    HPI Cory Hess is a 78 y.o. male.   HPI  78 year old male with past medical history of ischemic cardiomyopathy, hyperlipidemia, CAD, MI, heart failure with an.  Ejection fraction, essential hypertension, and cancer presents for evaluation of urinary urgency and frequency that started 5 days ago.  He denies any burning with urination, blood in his urine, changes to his stream, or increase in nocturia.  He denies fevers.  Past Medical History:  Diagnosis Date   Alternating RBBB & LBBB    Ankle swelling 2024   Arthritis    neck   Bladder tumor    CAD (coronary artery disease)    a. 08/2017 STEMI/Cath: LM 80ost/d, LAD 100p, LCX 95 ost/p, RCA 60p, 24m, RPDA 80ost; b. 08/2017 CABG x 3: LIMA->LAD, VG->OM, VG->PDA.   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 08/2017 TEE: EF 30-35%, sept/ant/inf HK, apical AK. 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 12/2017 Echo: EF 45-50%, diff HK. Gr1 DD. Mildly reduced RV fxn. Mild RAE; c. 09/2022 Echo: EF 55-60%, mod LVH, GrI DD, nl RV fxn, mildly dil RA, mild MR.   Hyperlipidemia LDL goal <70    Ischemic cardiomyopathy    a. 08/2017 TEE: EF 30-35%; b. 12/2017 Echo: EF 45-50%; c. 09/2022 Echo: EF 55-60%.   Myocardial infarction (HCC) 08/27/2017   Shingles    2018 - right side   Vertigo    several months ago    Patient Active Problem List   Diagnosis Date Noted   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   Hematuria 11/06/2018   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   Coronary artery disease 08/28/2017   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 [facility])     Past Surgical History:  Procedure Laterality Date   CARDIAC CATHETERIZATION     CATARACT  EXTRACTION W/PHACO Left 07/18/2018   Procedure: CATARACT EXTRACTION PHACO AND INTRAOCULAR LENS PLACEMENT (IOC)  LEFT;  Surgeon: Myrna Adine Anes, MD;  Location: [facility];  Service: Ophthalmology;  Laterality: Left;   CATARACT EXTRACTION W/PHACO Right 08/14/2018   Procedure: CATARACT EXTRACTION PHACO AND INTRAOCULAR LENS PLACEMENT (IOC)  RIGHT;  Surgeon: Myrna Adine Anes, MD;  Location: [facility];  Service: Ophthalmology;  Laterality: Right;   CORONARY ARTERY BYPASS GRAFT N/A 08/27/2017   Procedure: CORONARY ARTERY BYPASS GRAFTING (CABG) ON PUMP USING LEFT INTERNAL MAMMARY ARTERY AND LEFT GREATER SAPHENOUS VEIN VIA ENDOVEIN HARVEST;  Surgeon: Lucas Dorise POUR, MD;  Location: [facility] OR;  Service: Open Heart Surgery;  Laterality: N/A;   CORONARY/GRAFT ACUTE MI REVASCULARIZATION N/A 08/27/2017   Procedure: Coronary/Graft Acute MI Revascularization;  Surgeon: Darron Deatrice LABOR, MD;  Location: [facility];  Service: Cardiovascular;  Laterality: N/A;   CYSTOSCOPY W/ RETROGRADES Bilateral 12/25/2018   Procedure: CYSTOSCOPY WITH RETROGRADE PYELOGRAM;  Surgeon: Penne Knee, MD;  Location: [facility];  Service: Urology;  Laterality: Bilateral;   FRACTURE SURGERY Right 1958   arm and left wrist compound fracture, no metal   HERNIA REPAIR Right    inguinial   IR IMAGING GUIDED PORT INSERTION  10/15/2022   LEFT HEART CATH AND  CORONARY ANGIOGRAPHY N/A 08/27/2017   Procedure: LEFT HEART CATH AND CORONARY ANGIOGRAPHY;  Surgeon: Darron Deatrice LABOR, MD;  Location: [facility];  Service: Cardiovascular;  Laterality: N/A;   TRANSURETHRAL RESECTION OF BLADDER TUMOR WITH MITOMYCIN -C N/A 12/25/2018   Procedure: TRANSURETHRAL RESECTION OF BLADDER TUMOR WITH Gemcitabine ;  Surgeon: Penne Knee, MD;  Location: [facility];  Service: Urology;  Laterality: N/A;       Home Medications    Prior to Admission medications   Medication Sig Start Date End Date Taking? Authorizing Provider   acetaminophen  (TYLENOL ) 650 MG CR tablet Take 650 mg by mouth every 8 (eight) hours as needed for pain.   Yes [provider]  aspirin  EC 81 MG EC tablet Take 1 tablet (81 mg total) by mouth daily. 09/03/17  Yes Gold, Wayne E, PA-C  atorvastatin  (LIPITOR ) 80 MG tablet Take 1 tablet by mouth once daily 02/08/23  Yes Vivienne Lonni Ingle, NP  carvedilol  (COREG ) 12.5 MG tablet TAKE 1 TABLET BY MOUTH TWICE DAILY WITH A MEAL 03/10/23  Yes End, Lonni, MD  lidocaine -prilocaine  (EMLA ) cream Apply on the port. 30 -45 min  prior to port access. 10/08/22  Yes Brahmanday, Govinda R, MD  losartan  (COZAAR ) 100 MG tablet Take 1 tablet by mouth once daily 03/10/23  Yes End, Lonni, MD  magic mouthwash (multi-ingredient) oral suspension Swish and spit 5-10 mLs 4 (four) times daily as needed. 11/24/22  Yes Brahmanday, Govinda R, MD  Multiple Vitamin (MULTIVITAMIN WITH MINERALS) TABS tablet Take 1 tablet by mouth daily. One a Day over 50/ lunch   Yes [provider]  nitroGLYCERIN  (NITROSTAT ) 0.4 MG SL tablet DISSOLVE ONE TABLET UNDER THE TONGUE EVERY 5 MINUTES AS NEEDED FOR CHEST PAIN.  DO NOT EXCEED A TOTAL OF 3 DOSES IN 15 MINUTES 03/24/23  Yes Vivienne Lonni Ingle, NP  OLANZapine  (ZYPREXA ) 5 MG tablet Take one pill at night. 11/03/22  Yes Brahmanday, Govinda R, MD  ondansetron  (ZOFRAN ) 8 MG tablet One pill every 8 hours as needed for nausea/vomitting. 10/08/22  Yes Brahmanday, Govinda R, MD  polyethylene glycol (MIRALAX ) 17 g packet Take 17 g by mouth daily. 11/07/22  Yes Margrette, Myah A, PA-C  prochlorperazine  (COMPAZINE ) 10 MG tablet Take 1 tablet (10 mg total) by mouth every 6 (six) hours as needed for nausea or vomiting. 10/08/22  Yes Brahmanday, Govinda R, MD  senna-docusate (SENOKOT-S) 8.6-50 MG tablet Take 1 tablet by mouth daily. 11/07/22  Yes Harrison, Myah A, PA-C  sucralfate  (CARAFATE ) 1 g tablet Take 1 tablet (1 g total) by mouth 4 (four) times daily -  with meals and at  bedtime. Dissolve in 4 tablespoons warm water  30 minutes before meals. 10/26/22  Yes Chrystal, Marcey, MD  sulfamethoxazole -trimethoprim  (BACTRIM  DS) 800-160 MG tablet Take 1 tablet by mouth 2 (two) times daily for 10 days. 03/29/23 04/08/23 Yes Bernardino Ditch, NP  traMADol  HCl 100 MG TABS Take 100 mg by mouth every 8 (eight) hours as needed. 10/26/22  Yes Brahmanday, Govinda R, MD  torsemide  (DEMADEX ) 20 MG tablet Take 1 tablet (20 mg total) by mouth daily as needed (swelling and weight gain). 11/25/22 02/23/23  Vivienne Lonni Ingle, NP    Family History Family History  Problem Relation Age of Onset   Heart failure Mother    Lung disease Father    Stroke Father    Cervical cancer Maternal Grandmother     Social History Social History   Tobacco Use   Smoking status: Former  Current packs/day: 0.00    Types: Cigarettes    Quit date: 2000    Years since quitting: 25.1   Smokeless tobacco: Current    Types: Snuff  Vaping Use   Vaping status: Never Used  Substance Use Topics   Alcohol use: Not Currently   Drug use: Not Currently     Allergies   Patient has no known allergies.   Review of Systems Review of Systems  Constitutional:  Negative for fever.  Gastrointestinal:  Negative for abdominal pain, nausea and vomiting.  Genitourinary:  Positive for frequency and urgency. Negative for dysuria and hematuria.  Musculoskeletal:  Negative for back pain.     Physical Exam Triage Vital Signs ED Triage Vitals  Encounter Vitals Group     BP 03/29/23 1318 104/62     Systolic BP Percentile --      Diastolic BP Percentile --      Pulse Rate 03/29/23 1318 85     Resp 03/29/23 1318 16     Temp 03/29/23 1318 98.7 F (37.1 C)     Temp Source 03/29/23 1318 Oral     SpO2 03/29/23 1318 95 %     Weight 03/29/23 1318 240 lb (108.9 kg)     Height 03/29/23 1318 6' (1.829 m)     Head Circumference --      Peak Flow --      Pain Score 03/29/23 1321 0     Pain Loc --      Pain Education  --      Exclude from Growth Chart --    No data found.  Updated Vital Signs BP 104/62 (BP Location: Left Arm)   Pulse 85   Temp 98.7 F (37.1 C) (Oral)   Resp 16   Ht 6' (1.829 m)   Wt 240 lb (108.9 kg)   SpO2 95%   BMI 32.55 kg/m   Visual Acuity Right Eye Distance:   Left Eye Distance:   Bilateral Distance:    Right Eye Near:   Left Eye Near:    Bilateral Near:     Physical Exam Vitals and nursing note reviewed.  Constitutional:      Appearance: Normal appearance. He is not ill-appearing.  HENT:     Head: Normocephalic and atraumatic.  Cardiovascular:     Rate and Rhythm: Normal rate and regular rhythm.     Pulses: Normal pulses.     Heart sounds: Normal heart sounds. No murmur heard.    No friction rub. No gallop.  Pulmonary:     Effort: Pulmonary effort is normal.     Breath sounds: Normal breath sounds. No wheezing, rhonchi or rales.  Abdominal:     Tenderness: There is no right CVA tenderness or left CVA tenderness.  Skin:    General: Skin is warm and dry.     Capillary Refill: Capillary refill takes less than 2 seconds.     Findings: No rash.  Neurological:     General: No focal deficit present.     Mental Status: He is alert and oriented to person, place, and time.      UC Treatments / Results  Labs (all labs ordered are listed, but only abnormal results are displayed) Labs Reviewed  URINALYSIS, W/ REFLEX TO CULTURE (INFECTION SUSPECTED) - Abnormal; Notable for the following components:      Result Value   APPearance CLOUDY (*)    Ketones, ur TRACE (*)    Protein, ur 100 (*)  Leukocytes,Ua SMALL (*)    Bacteria, UA MANY (*)    All other components within normal limits  URINE CULTURE    EKG   Radiology No results found.  Procedures Procedures (including critical care time)  Medications Ordered in UC Medications - No data to display  Initial Impression / Assessment and Plan / UC Course  I have reviewed the triage vital signs and  the nursing notes.  Pertinent labs & imaging results that were available during my care of the patient were reviewed by me and considered in my medical decision making (see chart for details).   Patient is a nontoxic-appearing 71 old male presenting for evaluation of urinary symptoms as outlined HPI above.  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  He has been living off of protein shakes and he admits that he has had decreased oral fluid intake.  He has had urinary tract infections in the past and is concerned he has 1 now because he has an increase in urinary urgency and frequency.  He denies dysuria or hematuria.  A urinalysis to evaluate for the presence of UTI.  Urinalysis shows a cloudy appearance with trace ketones, 100 protein, and small leukocyte esterase.  Negative for nitrates.  Reflex microscopy shows greater than 50 WBCs, 6-10 RBCs, many bacteria with WBC clumps and granular casts.  Urine will reflex to culture.  I will discharge patient home with a diagnosis of urinary tract infection and start him on Bactrim  DS twice daily with full glass of water  for 10 days for treatment of urinary tract infection.  Patient's most recent lab work shows a GFR of 46 with a calculated creatinine clearance of 51 she has not required dosage modification.   Final Clinical Impressions(s) / UC Diagnoses   Final diagnoses:  Lower urinary tract infectious disease     Discharge Instructions      Take the Bactrim  DS twice daily for 10 days with a full glass of water  for treatment of urinary tract infection.  Increase your oral fluid intake so that you increase your urine production and or flushing your urinary system.  Take an over-the-counter probiotic, such as Culturelle-Align-Activia, 1 hour after each dose of antibiotic to prevent diarrhea or  yeast infections from forming.  We will culture urine and change the antibiotics if necessary.  Return for reevaluation, or see your primary care provider, for any new or worsening symptoms.      ED Prescriptions     Medication Sig Dispense Auth. Provider   sulfamethoxazole -trimethoprim  (BACTRIM  DS) 800-160 MG tablet Take 1 tablet by mouth 2 (two) times daily for 10 days. 20 tablet Bernardino Ditch, NP      PDMP not reviewed this encounter.   Bernardino Ditch, NP 03/29/23 1348

## 2023-03-31 LAB — URINE CULTURE: Culture: 50000 — AB

## 2023-04-05 ENCOUNTER — Inpatient Hospital Stay: Payer: Medicare Other

## 2023-04-05 ENCOUNTER — Inpatient Hospital Stay
Admission: EM | Admit: 2023-04-05 | Discharge: 2023-04-21 | DRG: 853 | Disposition: A | Discharge status: Other Acute Inpatient Hospital | Payer: Medicare Other | Source: Ambulatory Visit | Attending: Pulmonary Disease | Admitting: Pulmonary Disease

## 2023-04-05 ENCOUNTER — Emergency Department: Payer: Medicare Other

## 2023-04-05 ENCOUNTER — Ambulatory Visit
Admission: EM | Admit: 2023-04-05 | Discharge: 2023-04-05 | Disposition: A | Discharge status: Home / Self Care | Payer: Medicare Other

## 2023-04-05 ENCOUNTER — Encounter: Payer: Self-pay | Admitting: Internal Medicine

## 2023-04-05 ENCOUNTER — Encounter
Admission: EM | Disposition: A | Discharge status: Nursing Home (Includes ICF) | Payer: Self-pay | Source: Ambulatory Visit | Attending: Internal Medicine

## 2023-04-05 ENCOUNTER — Telehealth: Payer: Self-pay

## 2023-04-05 DIAGNOSIS — I2510 Atherosclerotic heart disease of native coronary artery without angina pectoris: Secondary | ICD-10-CM | POA: Diagnosis not present

## 2023-04-05 DIAGNOSIS — R569 Unspecified convulsions: Secondary | ICD-10-CM | POA: Diagnosis not present

## 2023-04-05 DIAGNOSIS — I6782 Cerebral ischemia: Secondary | ICD-10-CM | POA: Diagnosis not present

## 2023-04-05 DIAGNOSIS — R4182 Altered mental status, unspecified: Secondary | ICD-10-CM

## 2023-04-05 DIAGNOSIS — R600 Localized edema: Secondary | ICD-10-CM | POA: Diagnosis not present

## 2023-04-05 DIAGNOSIS — J9621 Acute and chronic respiratory failure with hypoxia: Secondary | ICD-10-CM | POA: Diagnosis not present

## 2023-04-05 DIAGNOSIS — J811 Chronic pulmonary edema: Secondary | ICD-10-CM | POA: Diagnosis not present

## 2023-04-05 DIAGNOSIS — E871 Hypo-osmolality and hyponatremia: Secondary | ICD-10-CM | POA: Diagnosis not present

## 2023-04-05 DIAGNOSIS — Z66 Do not resuscitate: Secondary | ICD-10-CM | POA: Diagnosis not present

## 2023-04-05 DIAGNOSIS — Z951 Presence of aortocoronary bypass graft: Secondary | ICD-10-CM | POA: Diagnosis not present

## 2023-04-05 DIAGNOSIS — E875 Hyperkalemia: Secondary | ICD-10-CM | POA: Insufficient documentation

## 2023-04-05 DIAGNOSIS — R6521 Severe sepsis with septic shock: Secondary | ICD-10-CM | POA: Diagnosis present

## 2023-04-05 DIAGNOSIS — R579 Shock, unspecified: Secondary | ICD-10-CM | POA: Diagnosis not present

## 2023-04-05 DIAGNOSIS — J984 Other disorders of lung: Secondary | ICD-10-CM | POA: Diagnosis not present

## 2023-04-05 DIAGNOSIS — I2699 Other pulmonary embolism without acute cor pulmonale: Secondary | ICD-10-CM | POA: Diagnosis not present

## 2023-04-05 DIAGNOSIS — I340 Nonrheumatic mitral (valve) insufficiency: Secondary | ICD-10-CM | POA: Diagnosis present

## 2023-04-05 DIAGNOSIS — I252 Old myocardial infarction: Secondary | ICD-10-CM

## 2023-04-05 DIAGNOSIS — A419 Sepsis, unspecified organism: Secondary | ICD-10-CM | POA: Diagnosis present

## 2023-04-05 DIAGNOSIS — I700 Atherosclerosis of aorta: Secondary | ICD-10-CM | POA: Diagnosis not present

## 2023-04-05 DIAGNOSIS — J9600 Acute respiratory failure, unspecified whether with hypoxia or hypercapnia: Secondary | ICD-10-CM | POA: Diagnosis not present

## 2023-04-05 DIAGNOSIS — I639 Cerebral infarction, unspecified: Secondary | ICD-10-CM | POA: Diagnosis not present

## 2023-04-05 DIAGNOSIS — I213 ST elevation (STEMI) myocardial infarction of unspecified site: Secondary | ICD-10-CM | POA: Diagnosis present

## 2023-04-05 DIAGNOSIS — J9601 Acute respiratory failure with hypoxia: Secondary | ICD-10-CM | POA: Diagnosis not present

## 2023-04-05 DIAGNOSIS — Z7982 Long term (current) use of aspirin: Secondary | ICD-10-CM

## 2023-04-05 DIAGNOSIS — R570 Cardiogenic shock: Secondary | ICD-10-CM | POA: Diagnosis not present

## 2023-04-05 DIAGNOSIS — I25118 Atherosclerotic heart disease of native coronary artery with other forms of angina pectoris: Secondary | ICD-10-CM | POA: Diagnosis not present

## 2023-04-05 DIAGNOSIS — Z823 Family history of stroke: Secondary | ICD-10-CM

## 2023-04-05 DIAGNOSIS — Z515 Encounter for palliative care: Secondary | ICD-10-CM

## 2023-04-05 DIAGNOSIS — Z7902 Long term (current) use of antithrombotics/antiplatelets: Secondary | ICD-10-CM

## 2023-04-05 DIAGNOSIS — E872 Acidosis, unspecified: Secondary | ICD-10-CM | POA: Diagnosis present

## 2023-04-05 DIAGNOSIS — E876 Hypokalemia: Secondary | ICD-10-CM | POA: Diagnosis not present

## 2023-04-05 DIAGNOSIS — I672 Cerebral atherosclerosis: Secondary | ICD-10-CM | POA: Diagnosis not present

## 2023-04-05 DIAGNOSIS — N390 Urinary tract infection, site not specified: Secondary | ICD-10-CM | POA: Diagnosis not present

## 2023-04-05 DIAGNOSIS — I469 Cardiac arrest, cause unspecified: Secondary | ICD-10-CM

## 2023-04-05 DIAGNOSIS — R109 Unspecified abdominal pain: Secondary | ICD-10-CM | POA: Diagnosis not present

## 2023-04-05 DIAGNOSIS — R0603 Acute respiratory distress: Secondary | ICD-10-CM | POA: Diagnosis not present

## 2023-04-05 DIAGNOSIS — N170 Acute kidney failure with tubular necrosis: Secondary | ICD-10-CM | POA: Diagnosis present

## 2023-04-05 DIAGNOSIS — R7401 Elevation of levels of liver transaminase levels: Secondary | ICD-10-CM | POA: Diagnosis present

## 2023-04-05 DIAGNOSIS — I82C11 Acute embolism and thrombosis of right internal jugular vein: Secondary | ICD-10-CM | POA: Diagnosis present

## 2023-04-05 DIAGNOSIS — R29727 NIHSS score 27: Secondary | ICD-10-CM | POA: Diagnosis not present

## 2023-04-05 DIAGNOSIS — R918 Other nonspecific abnormal finding of lung field: Secondary | ICD-10-CM | POA: Diagnosis not present

## 2023-04-05 DIAGNOSIS — Z9911 Dependence on respirator [ventilator] status: Secondary | ICD-10-CM | POA: Diagnosis not present

## 2023-04-05 DIAGNOSIS — Z79899 Other long term (current) drug therapy: Secondary | ICD-10-CM

## 2023-04-05 DIAGNOSIS — I25810 Atherosclerosis of coronary artery bypass graft(s) without angina pectoris: Secondary | ICD-10-CM | POA: Diagnosis present

## 2023-04-05 DIAGNOSIS — I214 Non-ST elevation (NSTEMI) myocardial infarction: Secondary | ICD-10-CM | POA: Diagnosis present

## 2023-04-05 DIAGNOSIS — I63521 Cerebral infarction due to unspecified occlusion or stenosis of right anterior cerebral artery: Secondary | ICD-10-CM | POA: Diagnosis not present

## 2023-04-05 DIAGNOSIS — Z9221 Personal history of antineoplastic chemotherapy: Secondary | ICD-10-CM

## 2023-04-05 DIAGNOSIS — R221 Localized swelling, mass and lump, neck: Secondary | ICD-10-CM | POA: Diagnosis not present

## 2023-04-05 DIAGNOSIS — I130 Hypertensive heart and chronic kidney disease with heart failure and stage 1 through stage 4 chronic kidney disease, or unspecified chronic kidney disease: Secondary | ICD-10-CM | POA: Diagnosis present

## 2023-04-05 DIAGNOSIS — Z95828 Presence of other vascular implants and grafts: Secondary | ICD-10-CM | POA: Diagnosis not present

## 2023-04-05 DIAGNOSIS — R739 Hyperglycemia, unspecified: Secondary | ICD-10-CM | POA: Diagnosis not present

## 2023-04-05 DIAGNOSIS — C7989 Secondary malignant neoplasm of other specified sites: Secondary | ICD-10-CM | POA: Diagnosis present

## 2023-04-05 DIAGNOSIS — R5381 Other malaise: Secondary | ICD-10-CM | POA: Diagnosis not present

## 2023-04-05 DIAGNOSIS — Z930 Tracheostomy status: Secondary | ICD-10-CM | POA: Diagnosis not present

## 2023-04-05 DIAGNOSIS — Z452 Encounter for adjustment and management of vascular access device: Secondary | ICD-10-CM | POA: Diagnosis not present

## 2023-04-05 DIAGNOSIS — E669 Obesity, unspecified: Secondary | ICD-10-CM | POA: Diagnosis present

## 2023-04-05 DIAGNOSIS — N1831 Chronic kidney disease, stage 3a: Secondary | ICD-10-CM | POA: Diagnosis present

## 2023-04-05 DIAGNOSIS — I10 Essential (primary) hypertension: Secondary | ICD-10-CM | POA: Diagnosis not present

## 2023-04-05 DIAGNOSIS — Z923 Personal history of irradiation: Secondary | ICD-10-CM

## 2023-04-05 DIAGNOSIS — I442 Atrioventricular block, complete: Secondary | ICD-10-CM | POA: Diagnosis not present

## 2023-04-05 DIAGNOSIS — J151 Pneumonia due to Pseudomonas: Secondary | ICD-10-CM | POA: Diagnosis not present

## 2023-04-05 DIAGNOSIS — I6523 Occlusion and stenosis of bilateral carotid arteries: Secondary | ICD-10-CM | POA: Diagnosis not present

## 2023-04-05 DIAGNOSIS — I2582 Chronic total occlusion of coronary artery: Secondary | ICD-10-CM | POA: Diagnosis present

## 2023-04-05 DIAGNOSIS — E870 Hyperosmolality and hypernatremia: Secondary | ICD-10-CM | POA: Diagnosis not present

## 2023-04-05 DIAGNOSIS — Z86711 Personal history of pulmonary embolism: Secondary | ICD-10-CM

## 2023-04-05 DIAGNOSIS — E785 Hyperlipidemia, unspecified: Secondary | ICD-10-CM | POA: Diagnosis present

## 2023-04-05 DIAGNOSIS — Y831 Surgical operation with implant of artificial internal device as the cause of abnormal reaction of the patient, or of later complication, without mention of misadventure at the time of the procedure: Secondary | ICD-10-CM | POA: Diagnosis present

## 2023-04-05 DIAGNOSIS — J690 Pneumonitis due to inhalation of food and vomit: Secondary | ICD-10-CM | POA: Diagnosis not present

## 2023-04-05 DIAGNOSIS — Z8249 Family history of ischemic heart disease and other diseases of the circulatory system: Secondary | ICD-10-CM

## 2023-04-05 DIAGNOSIS — C01 Malignant neoplasm of base of tongue: Secondary | ICD-10-CM | POA: Diagnosis present

## 2023-04-05 DIAGNOSIS — C770 Secondary and unspecified malignant neoplasm of lymph nodes of head, face and neck: Secondary | ICD-10-CM | POA: Diagnosis present

## 2023-04-05 DIAGNOSIS — R1310 Dysphagia, unspecified: Secondary | ICD-10-CM | POA: Diagnosis not present

## 2023-04-05 DIAGNOSIS — I5023 Acute on chronic systolic (congestive) heart failure: Secondary | ICD-10-CM | POA: Diagnosis not present

## 2023-04-05 DIAGNOSIS — I5043 Acute on chronic combined systolic (congestive) and diastolic (congestive) heart failure: Secondary | ICD-10-CM | POA: Diagnosis present

## 2023-04-05 DIAGNOSIS — J9622 Acute and chronic respiratory failure with hypercapnia: Secondary | ICD-10-CM | POA: Diagnosis present

## 2023-04-05 DIAGNOSIS — C779 Secondary and unspecified malignant neoplasm of lymph node, unspecified: Secondary | ICD-10-CM | POA: Diagnosis not present

## 2023-04-05 DIAGNOSIS — N179 Acute kidney failure, unspecified: Secondary | ICD-10-CM | POA: Diagnosis present

## 2023-04-05 DIAGNOSIS — I493 Ventricular premature depolarization: Secondary | ICD-10-CM | POA: Diagnosis present

## 2023-04-05 DIAGNOSIS — Z4682 Encounter for fitting and adjustment of non-vascular catheter: Secondary | ICD-10-CM | POA: Diagnosis not present

## 2023-04-05 DIAGNOSIS — J9602 Acute respiratory failure with hypercapnia: Secondary | ICD-10-CM

## 2023-04-05 DIAGNOSIS — J9811 Atelectasis: Secondary | ICD-10-CM | POA: Diagnosis not present

## 2023-04-05 DIAGNOSIS — Z8673 Personal history of transient ischemic attack (TIA), and cerebral infarction without residual deficits: Secondary | ICD-10-CM

## 2023-04-05 DIAGNOSIS — T82868A Thrombosis of vascular prosthetic devices, implants and grafts, initial encounter: Secondary | ICD-10-CM | POA: Diagnosis present

## 2023-04-05 DIAGNOSIS — Z7189 Other specified counseling: Secondary | ICD-10-CM | POA: Diagnosis not present

## 2023-04-05 DIAGNOSIS — R4189 Other symptoms and signs involving cognitive functions and awareness: Secondary | ICD-10-CM

## 2023-04-05 DIAGNOSIS — Z604 Social exclusion and rejection: Secondary | ICD-10-CM | POA: Diagnosis present

## 2023-04-05 DIAGNOSIS — J9690 Respiratory failure, unspecified, unspecified whether with hypoxia or hypercapnia: Secondary | ICD-10-CM | POA: Diagnosis not present

## 2023-04-05 DIAGNOSIS — R0989 Other specified symptoms and signs involving the circulatory and respiratory systems: Secondary | ICD-10-CM | POA: Diagnosis not present

## 2023-04-05 DIAGNOSIS — Z8551 Personal history of malignant neoplasm of bladder: Secondary | ICD-10-CM

## 2023-04-05 DIAGNOSIS — I808 Phlebitis and thrombophlebitis of other sites: Secondary | ICD-10-CM | POA: Diagnosis not present

## 2023-04-05 DIAGNOSIS — J90 Pleural effusion, not elsewhere classified: Secondary | ICD-10-CM | POA: Diagnosis not present

## 2023-04-05 DIAGNOSIS — I462 Cardiac arrest due to underlying cardiac condition: Secondary | ICD-10-CM | POA: Diagnosis present

## 2023-04-05 DIAGNOSIS — C799 Secondary malignant neoplasm of unspecified site: Secondary | ICD-10-CM | POA: Diagnosis not present

## 2023-04-05 DIAGNOSIS — Z87891 Personal history of nicotine dependence: Secondary | ICD-10-CM | POA: Diagnosis not present

## 2023-04-05 DIAGNOSIS — G9341 Metabolic encephalopathy: Secondary | ICD-10-CM | POA: Diagnosis present

## 2023-04-05 DIAGNOSIS — Z1152 Encounter for screening for COVID-19: Secondary | ICD-10-CM

## 2023-04-05 DIAGNOSIS — N281 Cyst of kidney, acquired: Secondary | ICD-10-CM | POA: Diagnosis not present

## 2023-04-05 DIAGNOSIS — Z6830 Body mass index (BMI) 30.0-30.9, adult: Secondary | ICD-10-CM

## 2023-04-05 DIAGNOSIS — I6340 Cerebral infarction due to embolism of unspecified cerebral artery: Secondary | ICD-10-CM | POA: Diagnosis not present

## 2023-04-05 DIAGNOSIS — I517 Cardiomegaly: Secondary | ICD-10-CM | POA: Diagnosis not present

## 2023-04-05 DIAGNOSIS — I5022 Chronic systolic (congestive) heart failure: Secondary | ICD-10-CM | POA: Diagnosis not present

## 2023-04-05 DIAGNOSIS — I2609 Other pulmonary embolism with acute cor pulmonale: Secondary | ICD-10-CM | POA: Diagnosis not present

## 2023-04-05 DIAGNOSIS — I255 Ischemic cardiomyopathy: Secondary | ICD-10-CM | POA: Diagnosis present

## 2023-04-05 DIAGNOSIS — Z720 Tobacco use: Secondary | ICD-10-CM

## 2023-04-05 HISTORY — PX: RIGHT/LEFT HEART CATH AND CORONARY/GRAFT ANGIOGRAPHY: CATH118267

## 2023-04-05 HISTORY — DX: Cardiac arrest, cause unspecified: I46.9

## 2023-04-05 HISTORY — DX: Acute on chronic systolic (congestive) heart failure: I50.23

## 2023-04-05 HISTORY — PX: TEMPORARY PACEMAKER: CATH118268

## 2023-04-05 HISTORY — DX: Cardiogenic shock: R57.0

## 2023-04-05 HISTORY — DX: Atrioventricular block, complete: I44.2

## 2023-04-05 LAB — POCT I-STAT EG7
Acid-Base Excess: 1 mmol/L (ref 0.0–2.0)
Bicarbonate: 30.7 mmol/L — ABNORMAL HIGH (ref 20.0–28.0)
Calcium, Ion: 1.7 mmol/L (ref 1.15–1.40)
HCT: 32 % — ABNORMAL LOW (ref 39.0–52.0)
Hemoglobin: 10.9 g/dL — ABNORMAL LOW (ref 13.0–17.0)
O2 Saturation: 51 %
Potassium: 6.2 mmol/L — ABNORMAL HIGH (ref 3.5–5.1)
Sodium: 137 mmol/L (ref 135–145)
TCO2: 33 mmol/L — ABNORMAL HIGH (ref 22–32)
pCO2, Ven: 80.3 mm[Hg] (ref 44–60)
pH, Ven: 7.191 — CL (ref 7.25–7.43)
pO2, Ven: 35 mm[Hg] (ref 32–45)

## 2023-04-05 LAB — HEMOGLOBIN A1C
Hgb A1c MFr Bld: 5.9 % — ABNORMAL HIGH (ref 4.8–5.6)
Mean Plasma Glucose: 122.63 mg/dL

## 2023-04-05 LAB — COOXEMETRY PANEL
Carboxyhemoglobin: 1.9 % — ABNORMAL HIGH (ref 0.5–1.5)
Methemoglobin: 1.1 % (ref 0.0–1.5)
O2 Saturation: 100 %
Total hemoglobin: 13.4 g/dL (ref 12.0–16.0)
Total oxygen content: 97 %

## 2023-04-05 LAB — POCT I-STAT 7, (LYTES, BLD GAS, ICA,H+H)
Acid-Base Excess: 2 mmol/L (ref 0.0–2.0)
Acid-base deficit: 1 mmol/L (ref 0.0–2.0)
Bicarbonate: 29.2 mmol/L — ABNORMAL HIGH (ref 20.0–28.0)
Bicarbonate: 31 mmol/L — ABNORMAL HIGH (ref 20.0–28.0)
Calcium, Ion: 1.87 mmol/L (ref 1.15–1.40)
Calcium, Ion: 1.71 mmol/L (ref 1.15–1.40)
HCT: 33 % — ABNORMAL LOW (ref 39.0–52.0)
HCT: 32 % — ABNORMAL LOW (ref 39.0–52.0)
Hemoglobin: 11.2 g/dL — ABNORMAL LOW (ref 13.0–17.0)
Hemoglobin: 10.9 g/dL — ABNORMAL LOW (ref 13.0–17.0)
O2 Saturation: 76 %
O2 Saturation: 80 %
Potassium: 6.1 mmol/L — ABNORMAL HIGH (ref 3.5–5.1)
Potassium: 6.3 mmol/L (ref 3.5–5.1)
Sodium: 141 mmol/L (ref 135–145)
Sodium: 141 mmol/L (ref 135–145)
TCO2: 32 mmol/L (ref 22–32)
TCO2: 33 mmol/L — ABNORMAL HIGH (ref 22–32)
pCO2 arterial: 83 mm[Hg] (ref 32–48)
pCO2 arterial: 72.4 mm[Hg] (ref 32–48)
pH, Arterial: 7.154 — CL (ref 7.35–7.45)
pH, Arterial: 7.24 — ABNORMAL LOW (ref 7.35–7.45)
pO2, Arterial: 54 mm[Hg] — ABNORMAL LOW (ref 83–108)
pO2, Arterial: 53 mm[Hg] — ABNORMAL LOW (ref 83–108)

## 2023-04-05 LAB — COMPREHENSIVE METABOLIC PANEL
ALT: 134 U/L — ABNORMAL HIGH (ref 0–44)
ALT: 300 U/L — ABNORMAL HIGH (ref 0–44)
AST: 83 U/L — ABNORMAL HIGH (ref 15–41)
AST: 273 U/L — ABNORMAL HIGH (ref 15–41)
Albumin: 3.5 g/dL (ref 3.5–5.0)
Albumin: 2.9 g/dL — ABNORMAL LOW (ref 3.5–5.0)
Alkaline Phosphatase: 99 U/L (ref 38–126)
Alkaline Phosphatase: 124 U/L (ref 38–126)
Anion gap: 22 — ABNORMAL HIGH (ref 5–15)
Anion gap: 9 (ref 5–15)
BUN: 36 mg/dL — ABNORMAL HIGH (ref 8–23)
BUN: 37 mg/dL — ABNORMAL HIGH (ref 8–23)
CO2: 16 mmol/L — ABNORMAL LOW (ref 22–32)
CO2: 27 mmol/L (ref 22–32)
Calcium: 9.2 mg/dL (ref 8.9–10.3)
Calcium: 11.2 mg/dL — ABNORMAL HIGH (ref 8.9–10.3)
Chloride: 101 mmol/L (ref 98–111)
Chloride: 106 mmol/L (ref 98–111)
Creatinine, Ser: 2.32 mg/dL — ABNORMAL HIGH (ref 0.61–1.24)
Creatinine, Ser: 2.33 mg/dL — ABNORMAL HIGH (ref 0.61–1.24)
GFR, Estimated: 28 mL/min — ABNORMAL LOW (ref >60)
GFR, Estimated: 28 mL/min — ABNORMAL LOW (ref >60)
Glucose, Bld: 227 mg/dL — ABNORMAL HIGH (ref 70–99)
Glucose, Bld: 155 mg/dL — ABNORMAL HIGH (ref 70–99)
Potassium: 5.1 mmol/L (ref 3.5–5.1)
Potassium: 5.7 mmol/L — ABNORMAL HIGH (ref 3.5–5.1)
Sodium: 139 mmol/L (ref 135–145)
Sodium: 142 mmol/L (ref 135–145)
Total Bilirubin: 0.8 mg/dL (ref 0.0–1.2)
Total Bilirubin: 0.9 mg/dL (ref 0.0–1.2)
Total Protein: 6.8 g/dL (ref 6.5–8.1)
Total Protein: 5.9 g/dL — ABNORMAL LOW (ref 6.5–8.1)

## 2023-04-05 LAB — SARS CORONAVIRUS 2 BY RT PCR: SARS Coronavirus 2 by RT PCR: NEGATIVE

## 2023-04-05 LAB — CBC WITH DIFFERENTIAL/PLATELET
Abs Immature Granulocytes: 0.54 10*3/uL — ABNORMAL HIGH (ref 0.00–0.07)
Basophils Absolute: 0.1 10*3/uL (ref 0.0–0.1)
Basophils Relative: 0 %
Eosinophils Absolute: 0.1 10*3/uL (ref 0.0–0.5)
Eosinophils Relative: 0 %
HCT: 37.3 % — ABNORMAL LOW (ref 39.0–52.0)
Hemoglobin: 12.8 g/dL — ABNORMAL LOW (ref 13.0–17.0)
Immature Granulocytes: 2 %
Lymphocytes Relative: 5 %
Lymphs Abs: 1.4 10*3/uL (ref 0.7–4.0)
MCH: 33.8 pg (ref 26.0–34.0)
MCHC: 34.3 g/dL (ref 30.0–36.0)
MCV: 98.4 fL (ref 80.0–100.0)
Monocytes Absolute: 1.7 10*3/uL — ABNORMAL HIGH (ref 0.1–1.0)
Monocytes Relative: 6 %
Neutro Abs: 25.2 10*3/uL — ABNORMAL HIGH (ref 1.7–7.7)
Neutrophils Relative %: 87 %
Platelets: 277 10*3/uL (ref 150–400)
RBC: 3.79 MIL/uL — ABNORMAL LOW (ref 4.22–5.81)
RDW: 11.4 % — ABNORMAL LOW (ref 11.5–15.5)
Smear Review: NORMAL
WBC: 29 10*3/uL — ABNORMAL HIGH (ref 4.0–10.5)
nRBC: 0 % (ref 0.0–0.2)

## 2023-04-05 LAB — URINALYSIS, COMPLETE (UACMP) WITH MICROSCOPIC
Bacteria, UA: NONE SEEN
Bilirubin Urine: NEGATIVE
Glucose, UA: NEGATIVE mg/dL
Hgb urine dipstick: NEGATIVE
Ketones, ur: NEGATIVE mg/dL
Nitrite: NEGATIVE
Protein, ur: NEGATIVE mg/dL
Specific Gravity, Urine: 1.016 (ref 1.005–1.030)
pH: 5 (ref 5.0–8.0)

## 2023-04-05 LAB — BLOOD GAS, ARTERIAL
Acid-Base Excess: 0.2 mmol/L (ref 0.0–2.0)
Acid-Base Excess: 3.2 mmol/L — ABNORMAL HIGH (ref 0.0–2.0)
Bicarbonate: 28.5 mmol/L — ABNORMAL HIGH (ref 20.0–28.0)
Bicarbonate: 29.9 mmol/L — ABNORMAL HIGH (ref 20.0–28.0)
FIO2: 1 %
FIO2: 100 %
MECHVT: 450 mL
MECHVT: 450 mL
Mechanical Rate: 26
O2 Saturation: 89.1 %
O2 Saturation: 100 %
PEEP: 14 cmH2O
PEEP: 14 cmH2O
Patient temperature: 37
Patient temperature: 37
RATE: 26 {breaths}/min
pCO2 arterial: 62 mm[Hg] — ABNORMAL HIGH (ref 32–48)
pCO2 arterial: 53 mm[Hg] — ABNORMAL HIGH (ref 32–48)
pH, Arterial: 7.27 — ABNORMAL LOW (ref 7.35–7.45)
pH, Arterial: 7.36 (ref 7.35–7.45)
pO2, Arterial: 59 mm[Hg] — ABNORMAL LOW (ref 83–108)
pO2, Arterial: 112 mm[Hg] — ABNORMAL HIGH (ref 83–108)

## 2023-04-05 LAB — BASIC METABOLIC PANEL
Anion gap: 11 (ref 5–15)
BUN: 43 mg/dL — ABNORMAL HIGH (ref 8–23)
CO2: 26 mmol/L (ref 22–32)
Calcium: 10.8 mg/dL — ABNORMAL HIGH (ref 8.9–10.3)
Chloride: 105 mmol/L (ref 98–111)
Creatinine, Ser: 2.57 mg/dL — ABNORMAL HIGH (ref 0.61–1.24)
GFR, Estimated: 25 mL/min — ABNORMAL LOW (ref >60)
Glucose, Bld: 144 mg/dL — ABNORMAL HIGH (ref 70–99)
Potassium: 5.3 mmol/L — ABNORMAL HIGH (ref 3.5–5.1)
Sodium: 142 mmol/L (ref 135–145)

## 2023-04-05 LAB — URINALYSIS, W/ REFLEX TO CULTURE (INFECTION SUSPECTED)
Bacteria, UA: NONE SEEN
Bilirubin Urine: NEGATIVE
Glucose, UA: NEGATIVE mg/dL
Hgb urine dipstick: NEGATIVE
Ketones, ur: NEGATIVE mg/dL
Nitrite: NEGATIVE
Protein, ur: NEGATIVE mg/dL
Specific Gravity, Urine: 1.016 (ref 1.005–1.030)
pH: 5 (ref 5.0–8.0)

## 2023-04-05 LAB — TROPONIN I (HIGH SENSITIVITY)
Troponin I (High Sensitivity): 21 ng/L — ABNORMAL HIGH (ref <18)
Troponin I (High Sensitivity): 898 ng/L (ref <18)

## 2023-04-05 LAB — CBC
HCT: 40.8 % (ref 39.0–52.0)
Hemoglobin: 12.5 g/dL — ABNORMAL LOW (ref 13.0–17.0)
MCH: 33.3 pg (ref 26.0–34.0)
MCHC: 30.6 g/dL (ref 30.0–36.0)
MCV: 108.8 fL — ABNORMAL HIGH (ref 80.0–100.0)
Platelets: 306 10*3/uL (ref 150–400)
RBC: 3.75 MIL/uL — ABNORMAL LOW (ref 4.22–5.81)
RDW: 11.4 % — ABNORMAL LOW (ref 11.5–15.5)
WBC: 13.7 10*3/uL — ABNORMAL HIGH (ref 4.0–10.5)
nRBC: 0 % (ref 0.0–0.2)

## 2023-04-05 LAB — PROTIME-INR
INR: 1.4 — ABNORMAL HIGH (ref 0.8–1.2)
Prothrombin Time: 17.4 s — ABNORMAL HIGH (ref 11.4–15.2)

## 2023-04-05 LAB — PHOSPHORUS: Phosphorus: 6.7 mg/dL — ABNORMAL HIGH (ref 2.5–4.6)

## 2023-04-05 LAB — MRSA NEXT GEN BY PCR, NASAL: MRSA by PCR Next Gen: NOT DETECTED

## 2023-04-05 LAB — MAGNESIUM: Magnesium: 2.5 mg/dL — ABNORMAL HIGH (ref 1.7–2.4)

## 2023-04-05 LAB — D-DIMER, QUANTITATIVE: D-Dimer, Quant: 6.28 ug{FEU}/mL — ABNORMAL HIGH (ref 0.00–0.50)

## 2023-04-05 LAB — GLUCOSE, CAPILLARY
Glucose-Capillary: 100 mg/dL — ABNORMAL HIGH (ref 70–99)
Glucose-Capillary: 112 mg/dL — ABNORMAL HIGH (ref 70–99)
Glucose-Capillary: 123 mg/dL — ABNORMAL HIGH (ref 70–99)

## 2023-04-05 LAB — TSH: TSH: 18.053 u[IU]/mL — ABNORMAL HIGH (ref 0.350–4.500)

## 2023-04-05 LAB — CG4 I-STAT (LACTIC ACID): Lactic Acid, Venous: 5.5 mmol/L (ref 0.5–1.9)

## 2023-04-05 LAB — LACTIC ACID, PLASMA
Lactic Acid, Venous: >9 mmol/L (ref 0.5–1.9)
Lactic Acid, Venous: 2.9 mmol/L (ref 0.5–1.9)
Lactic Acid, Venous: 2.9 mmol/L (ref 0.5–1.9)
Lactic Acid, Venous: 3.3 mmol/L (ref 0.5–1.9)

## 2023-04-05 LAB — BRAIN NATRIURETIC PEPTIDE: B Natriuretic Peptide: 169.6 pg/mL — ABNORMAL HIGH (ref 0.0–100.0)

## 2023-04-05 LAB — APTT: aPTT: 37 s — ABNORMAL HIGH (ref 24–36)

## 2023-04-05 LAB — T4, FREE: Free T4: 0.93 ng/dL (ref 0.61–1.12)

## 2023-04-05 SURGERY — RIGHT/LEFT HEART CATH AND CORONARY/GRAFT ANGIOGRAPHY
Anesthesia: Moderate Sedation

## 2023-04-05 MED ORDER — FUROSEMIDE 10 MG/ML IJ SOLN
INTRAMUSCULAR | Status: DC | PRN
  Administered 2023-04-05: 40 mg via INTRAVENOUS

## 2023-04-05 MED ORDER — ONDANSETRON HCL 4 MG/2ML IJ SOLN: 4.0000 mg | Freq: Four times a day (QID) | INTRAMUSCULAR | Status: DC | PRN

## 2023-04-05 MED ORDER — SODIUM BICARBONATE 8.4 % IV SOLN
INTRAVENOUS | Status: AC
Start: 2023-04-05 — End: 2023-04-06
  Filled 2023-04-05: qty 100

## 2023-04-05 MED ORDER — ATORVASTATIN CALCIUM 80 MG PO TABS
80.0000 mg | ORAL_TABLET | Freq: Every day | ORAL | Status: DC
Start: 2023-04-06 — End: 2023-04-06
  Administered 2023-04-06: 80 mg via ORAL
  Filled 2023-04-05: qty 1

## 2023-04-05 MED ORDER — SODIUM BICARBONATE 8.4 % IV SOLN
INTRAVENOUS | Status: DC | PRN
  Administered 2023-04-05: 50 meq via INTRAVENOUS

## 2023-04-05 MED ORDER — SODIUM CHLORIDE 0.9% FLUSH: 10.0000 mL | INTRAVENOUS | Status: DC | PRN

## 2023-04-05 MED ORDER — MIDAZOLAM 50MG/50ML (1MG/ML) PREMIX INFUSION
0.5000 mg/h | INTRAVENOUS | Status: DC
  Administered 2023-04-05: 0.5 mg/h via INTRAVENOUS
  Administered 2023-04-05: 2.5 mg/h via INTRAVENOUS
  Filled 2023-04-05 (×2): qty 50

## 2023-04-05 MED ORDER — EPINEPHRINE HCL 5 MG/250ML IV SOLN IN NS
INTRAVENOUS | Status: AC
  Filled 2023-04-05: qty 250

## 2023-04-05 MED ORDER — SODIUM CHLORIDE 0.9% IV SOLUTION: INTRAVENOUS | Status: DC | PRN

## 2023-04-05 MED ORDER — PROPOFOL 1000 MG/100ML IV EMUL
5.0000 ug/kg/min | INTRAVENOUS | Status: DC
Start: 2023-04-05 — End: 2023-04-13
  Administered 2023-04-05: 20 ug/kg/min via INTRAVENOUS
  Administered 2023-04-06 (×2): 35 ug/kg/min via INTRAVENOUS
  Administered 2023-04-06: 40 ug/kg/min via INTRAVENOUS
  Administered 2023-04-06 – 2023-04-08 (×9): 35 ug/kg/min via INTRAVENOUS
  Administered 2023-04-08: 25 ug/kg/min via INTRAVENOUS
  Administered 2023-04-08 – 2023-04-09 (×3): 40 ug/kg/min via INTRAVENOUS
  Administered 2023-04-09: 20 ug/kg/min via INTRAVENOUS
  Administered 2023-04-09: 40 ug/kg/min via INTRAVENOUS
  Administered 2023-04-09: 20 ug/kg/min via INTRAVENOUS
  Administered 2023-04-10: 40 ug/kg/min via INTRAVENOUS
  Administered 2023-04-10: 60 ug/kg/min via INTRAVENOUS
  Administered 2023-04-10: 30 ug/kg/min via INTRAVENOUS
  Administered 2023-04-10: 40 ug/kg/min via INTRAVENOUS
  Administered 2023-04-10: 25 ug/kg/min via INTRAVENOUS
  Administered 2023-04-10: 50 ug/kg/min via INTRAVENOUS
  Administered 2023-04-11: 30 ug/kg/min via INTRAVENOUS
  Administered 2023-04-11: 60 ug/kg/min via INTRAVENOUS
  Administered 2023-04-11: 40 ug/kg/min via INTRAVENOUS
  Administered 2023-04-11: 60 ug/kg/min via INTRAVENOUS
  Administered 2023-04-11: 40 ug/kg/min via INTRAVENOUS
  Administered 2023-04-11: 60 ug/kg/min via INTRAVENOUS
  Administered 2023-04-11: 20 ug/kg/min via INTRAVENOUS
  Administered 2023-04-11 (×2): 40 ug/kg/min via INTRAVENOUS
  Administered 2023-04-11 (×2): 60 ug/kg/min via INTRAVENOUS
  Administered 2023-04-11 (×2): 40 ug/kg/min via INTRAVENOUS
  Administered 2023-04-12 (×2): 20 ug/kg/min via INTRAVENOUS
  Administered 2023-04-12: 50 ug/kg/min via INTRAVENOUS
  Administered 2023-04-12: 60 ug/kg/min via INTRAVENOUS
  Administered 2023-04-12: 20 ug/kg/min via INTRAVENOUS
  Administered 2023-04-12: 60 ug/kg/min via INTRAVENOUS
  Administered 2023-04-13: 20 ug/kg/min via INTRAVENOUS
  Filled 2023-04-05 (×39): qty 100

## 2023-04-05 MED ORDER — HYDRALAZINE HCL 20 MG/ML IJ SOLN: 10.0000 mg | INTRAMUSCULAR | Status: AC | PRN

## 2023-04-05 MED ORDER — IPRATROPIUM-ALBUTEROL 0.5-2.5 (3) MG/3ML IN SOLN: 3.0000 mL | Freq: Four times a day (QID) | RESPIRATORY_TRACT | Status: DC | PRN

## 2023-04-05 MED ORDER — HEPARIN (PORCINE) IN NACL 1000-0.9 UT/500ML-% IV SOLN
INTRAVENOUS | Status: AC
  Filled 2023-04-05: qty 1000

## 2023-04-05 MED ORDER — ACETAMINOPHEN 325 MG PO TABS: 650.0000 mg | ORAL_TABLET | ORAL | Status: DC | PRN

## 2023-04-05 MED ORDER — FAMOTIDINE IN NACL 20-0.9 MG/50ML-% IV SOLN
20.0000 mg | INTRAVENOUS | Status: DC
  Administered 2023-04-05 – 2023-04-16 (×12): 20 mg via INTRAVENOUS
  Filled 2023-04-05 (×12): qty 50

## 2023-04-05 MED ORDER — SODIUM BICARBONATE 8.4 % IV SOLN
INTRAVENOUS | Status: AC
  Filled 2023-04-05: qty 100

## 2023-04-05 MED ORDER — DEXTROSE 50 % IV SOLN
1.0000 | Freq: Once | INTRAVENOUS | Status: AC
  Administered 2023-04-05: 50 mL via INTRAVENOUS
  Filled 2023-04-05: qty 50

## 2023-04-05 MED ORDER — FENTANYL CITRATE (PF) 100 MCG/2ML IJ SOLN
INTRAMUSCULAR | Status: AC
  Filled 2023-04-05: qty 2

## 2023-04-05 MED ORDER — LIDOCAINE HCL (PF) 1 % IJ SOLN
INTRAMUSCULAR | Status: DC | PRN
  Administered 2023-04-05 (×2): 2 mL

## 2023-04-05 MED ORDER — LIDOCAINE HCL 1 % IJ SOLN
INTRAMUSCULAR | Status: AC
  Filled 2023-04-05: qty 20

## 2023-04-05 MED ORDER — SODIUM CHLORIDE 0.9 % IV SOLN
2.0000 g | INTRAVENOUS | Status: DC
  Administered 2023-04-05: 2 g via INTRAVENOUS
  Filled 2023-04-05: qty 20

## 2023-04-05 MED ORDER — MIDAZOLAM HCL 2 MG/2ML IJ SOLN
INTRAMUSCULAR | Status: AC
  Filled 2023-04-05: qty 2

## 2023-04-05 MED ORDER — SODIUM CHLORIDE 0.9% FLUSH
3.0000 mL | Freq: Two times a day (BID) | INTRAVENOUS | Status: DC
  Administered 2023-04-05 – 2023-04-06 (×2): 3 mL via INTRAVENOUS

## 2023-04-05 MED ORDER — ASPIRIN 81 MG PO TBEC
81.0000 mg | DELAYED_RELEASE_TABLET | Freq: Every day | ORAL | Status: DC
  Administered 2023-04-06: 81 mg via ORAL
  Filled 2023-04-05: qty 1

## 2023-04-05 MED ORDER — HEPARIN (PORCINE) 25000 UT/250ML-% IV SOLN
1600.0000 [IU]/h | INTRAVENOUS | Status: DC
  Administered 2023-04-05: 1600 [IU]/h via INTRAVENOUS
  Filled 2023-04-05: qty 250

## 2023-04-05 MED ORDER — CALCIUM CHLORIDE 10 % IV SOLN
INTRAVENOUS | Status: AC
Start: 2023-04-05 — End: 2023-04-06
  Filled 2023-04-05: qty 40

## 2023-04-05 MED ORDER — SODIUM CHLORIDE 0.9 % IV SOLN
INTRAVENOUS | Status: AC | PRN
  Administered 2023-04-05 (×2): 10 mL/h via INTRAVENOUS

## 2023-04-05 MED ORDER — MIDAZOLAM HCL 2 MG/2ML IJ SOLN
1.0000 mg | INTRAMUSCULAR | Status: DC | PRN
  Administered 2023-04-05 – 2023-04-08 (×3): 2 mg via INTRAVENOUS
  Filled 2023-04-05 (×2): qty 2

## 2023-04-05 MED ORDER — CALCIUM CHLORIDE 10 % IV SOLN
INTRAVENOUS | Status: AC
  Filled 2023-04-05: qty 20

## 2023-04-05 MED ORDER — ORAL CARE MOUTH RINSE: 15.0000 mL | OROMUCOSAL | Status: DC | PRN

## 2023-04-05 MED ORDER — ORAL CARE MOUTH RINSE
15.0000 mL | OROMUCOSAL | Status: DC
  Administered 2023-04-05 – 2023-04-13 (×93): 15 mL via OROMUCOSAL

## 2023-04-05 MED ORDER — HEPARIN (PORCINE) IN NACL 1000-0.9 UT/500ML-% IV SOLN
INTRAVENOUS | Status: DC | PRN
  Administered 2023-04-05: 1000 mL

## 2023-04-05 MED ORDER — FENTANYL 2500MCG IN NS 250ML (10MCG/ML) PREMIX INFUSION
25.0000 ug/h | INTRAVENOUS | Status: DC
  Administered 2023-04-05: 25 ug/h via INTRAVENOUS
  Administered 2023-04-06: 150 ug/h via INTRAVENOUS
  Administered 2023-04-07: 30 ug/h via INTRAVENOUS
  Administered 2023-04-07: 150 ug/h via INTRAVENOUS
  Administered 2023-04-08: 50 ug/h via INTRAVENOUS
  Filled 2023-04-05 (×4): qty 250

## 2023-04-05 MED ORDER — CHLORHEXIDINE GLUCONATE CLOTH 2 % EX PADS
6.0000 | MEDICATED_PAD | Freq: Every day | CUTANEOUS | Status: DC
  Administered 2023-04-05 – 2023-04-09 (×5): 6 via TOPICAL

## 2023-04-05 MED ORDER — VERAPAMIL HCL 2.5 MG/ML IV SOLN
INTRAVENOUS | Status: AC
Start: 2023-04-05 — End: ?
  Filled 2023-04-05: qty 2

## 2023-04-05 MED ORDER — INSULIN ASPART 100 UNIT/ML IJ SOLN
0.0000 [IU] | INTRAMUSCULAR | Status: DC
  Administered 2023-04-06 – 2023-04-15 (×5): 1 [IU] via SUBCUTANEOUS
  Filled 2023-04-05 (×5): qty 1

## 2023-04-05 MED ORDER — EPINEPHRINE 1 MG/10ML IJ SOSY
PREFILLED_SYRINGE | INTRAMUSCULAR | Status: AC
  Filled 2023-04-05: qty 10

## 2023-04-05 MED ORDER — SODIUM BICARBONATE 8.4 % IV SOLN
INTRAVENOUS | Status: AC
  Filled 2023-04-05: qty 50

## 2023-04-05 MED ORDER — FENTANYL BOLUS VIA INFUSION
25.0000 ug | INTRAVENOUS | Status: DC | PRN
  Administered 2023-04-05 (×3): 75 ug via INTRAVENOUS
  Administered 2023-04-07 – 2023-04-08 (×2): 50 ug via INTRAVENOUS
  Administered 2023-04-08: 75 ug via INTRAVENOUS
  Administered 2023-04-08 (×2): 50 ug via INTRAVENOUS
  Administered 2023-04-08: 75 ug via INTRAVENOUS
  Administered 2023-04-08 – 2023-04-09 (×2): 50 ug via INTRAVENOUS

## 2023-04-05 MED ORDER — NOREPINEPHRINE 16 MG/250ML-% IV SOLN
0.0000 ug/min | INTRAVENOUS | Status: DC
  Administered 2023-04-05: 2 ug/min via INTRAVENOUS
  Administered 2023-04-06: 25 ug/min via INTRAVENOUS
  Administered 2023-04-06: 40 ug/min via INTRAVENOUS
  Administered 2023-04-07: 6 ug/min via INTRAVENOUS
  Filled 2023-04-05 (×4): qty 250

## 2023-04-05 MED ORDER — SODIUM BICARBONATE 8.4 % IV SOLN
INTRAVENOUS | Status: AC
Start: 2023-04-05 — End: 2023-04-06
  Filled 2023-04-05: qty 200

## 2023-04-05 MED ORDER — DOCUSATE SODIUM 50 MG/5ML PO LIQD
100.0000 mg | Freq: Two times a day (BID) | ORAL | Status: DC
  Administered 2023-04-06 – 2023-04-11 (×11): 100 mg
  Filled 2023-04-05 (×11): qty 10

## 2023-04-05 MED ORDER — SODIUM CHLORIDE 0.9% FLUSH
10.0000 mL | Freq: Two times a day (BID) | INTRAVENOUS | Status: DC
  Administered 2023-04-05 – 2023-04-20 (×31): 10 mL
  Administered 2023-04-21: 20 mL

## 2023-04-05 MED ORDER — SODIUM CHLORIDE 0.9 % IV SOLN: 250.0000 mL | INTRAVENOUS | Status: DC | PRN

## 2023-04-05 MED ORDER — MIDAZOLAM HCL 2 MG/2ML IJ SOLN
INTRAMUSCULAR | Status: DC | PRN
  Administered 2023-04-05: 2 mg via INTRAVENOUS
  Administered 2023-04-05: 1 mg via INTRAVENOUS

## 2023-04-05 MED ORDER — FUROSEMIDE 10 MG/ML IJ SOLN
INTRAMUSCULAR | Status: AC
  Filled 2023-04-05: qty 4

## 2023-04-05 MED ORDER — SODIUM CHLORIDE 0.9 % IV SOLN: INTRAVENOUS | Status: DC

## 2023-04-05 MED ORDER — POLYETHYLENE GLYCOL 3350 17 G PO PACK
17.0000 g | PACK | Freq: Every day | ORAL | Status: DC
  Administered 2023-04-06 – 2023-04-11 (×6): 17 g
  Filled 2023-04-05 (×6): qty 1

## 2023-04-05 MED ORDER — SODIUM CHLORIDE 0.9% FLUSH: 3.0000 mL | INTRAVENOUS | Status: DC | PRN

## 2023-04-05 MED ORDER — SODIUM CHLORIDE 0.9% IV SOLUTION: INTRAVENOUS | Status: DC

## 2023-04-05 MED ORDER — HEPARIN SODIUM (PORCINE) 1000 UNIT/ML IJ SOLN
INTRAMUSCULAR | Status: AC
  Filled 2023-04-05: qty 10

## 2023-04-05 MED ORDER — IOHEXOL 300 MG/ML  SOLN
INTRAMUSCULAR | Status: DC | PRN
  Administered 2023-04-05: 62 mL

## 2023-04-05 MED ORDER — MIDAZOLAM HCL 2 MG/2ML IJ SOLN
INTRAMUSCULAR | Status: AC
Start: 2023-04-05 — End: ?
  Filled 2023-04-05: qty 2

## 2023-04-05 MED ORDER — FENTANYL CITRATE (PF) 100 MCG/2ML IJ SOLN
INTRAMUSCULAR | Status: DC | PRN
  Administered 2023-04-05: 25 ug via INTRAVENOUS
  Administered 2023-04-05: 50 ug via INTRAVENOUS
  Administered 2023-04-05: 25 ug via INTRAVENOUS

## 2023-04-05 MED ORDER — VASOPRESSIN 20 UNITS/100 ML INFUSION FOR SHOCK
0.0000 [IU]/min | INTRAVENOUS | Status: DC
  Administered 2023-04-05 – 2023-04-07 (×5): 0.04 [IU]/min via INTRAVENOUS
  Filled 2023-04-05 (×5): qty 100

## 2023-04-05 MED ORDER — FENTANYL CITRATE PF 50 MCG/ML IJ SOSY: 25.0000 ug | PREFILLED_SYRINGE | Freq: Once | INTRAMUSCULAR | Status: DC

## 2023-04-05 MED ORDER — INSULIN ASPART 100 UNIT/ML IV SOLN
10.0000 [IU] | Freq: Once | INTRAVENOUS | Status: AC
Start: 2023-04-05 — End: 2023-04-05
  Administered 2023-04-05: 10 [IU] via INTRAVENOUS
  Filled 2023-04-05: qty 0.1

## 2023-04-05 SURGICAL SUPPLY — 16 items
BIOPATCH RED 1 DISK 7.0 (GAUZE/BANDAGES/DRESSINGS) IMPLANT
CANNULA 5F STIFF (CANNULA) IMPLANT
CATH INFINITI 5FR MULTPACK ANG (CATHETERS) IMPLANT
CATH S G BIP PACING (CATHETERS) IMPLANT
CATH SWAN GANZ VIP 7.5F (CATHETERS) IMPLANT
DEVICE CLOSURE MYNXGRIP 6/7F (Vascular Products) IMPLANT
DRAPE BRACHIAL (DRAPES) IMPLANT
PACK CARDIAC CATH (CUSTOM PROCEDURE TRAY) ×1 IMPLANT
PROTECTION STATION PRESSURIZED (MISCELLANEOUS) ×1 IMPLANT
SET ATX-X65L (MISCELLANEOUS) IMPLANT
SHEATH AVANTI 6FR X 11CM (SHEATH) IMPLANT
SHEATH BRITE TIP 8FRX11 (SHEATH) IMPLANT
STATION PROTECTION PRESSURIZED (MISCELLANEOUS) IMPLANT
SUT SILK 0 FSL (SUTURE) IMPLANT
TUBING CIL FLEX 10 FLL-RA (TUBING) IMPLANT
WIRE GUIDERIGHT .035X150 (WIRE) IMPLANT

## 2023-04-05 NOTE — Consult Note (Signed)
Advanced Heart Failure Team Consult Note   Primary Physician: Patient, No Pcp Per Cardiologist:  Yvonne Kendall, MD  Reason for Consultation: Cardiac arrest  HPI:    Cory Hess is seen today for evaluation of cardiac arrest at the request of Dr. Okey Dupre, cardiology.   Cory Hess is a 78 y.o. 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   Recently seen in ED for urinary urgency. UA + for UTI. He was started on PO bactrim.   AHF team called to bedside for cardiac arrest. Cory Hess initially presented to [facility] urgent care for follow up of UTI?Marland Kitchen While waiting to be seen he suffered a witnessed cardiac arrest. Bystanders started CPR with EMS on the scene within 30 seconds. Patient transferred to [facility]. While at [facility] patient required CPR numerous times, rhythm varied between PEA, asystole and CHB. Patient received epi, ca, and bicarb. He was intubated. Down time ~1.5 hrs. Eventual ROSC, patient being transcutaneously paced. Rhythm stabilized and transferred to cath lab.   Labs reviewed: K 5.1, SCr 2.32 (baseline ~1.1), CO2 16, lactic acid >9, HsTrop 21, WBC 13.7, EKG with wide complex rhythm 97 bpm, RBBB, and QTc at 617. pH 7.1, pCO2 83, pO2 51. POCUS showed EF ~40%.   Intubated and transferred to cath lab.   Cardiac studies reviewed:  08/2017 STEMI/Cath: LM 80ost/d, LAD 100p, LCX 95 ost/p, RCA 60p, 27m, RPDA 80ost 08/2017 TEE: EF 30-35%, sept/ant/inf HK, apical AK. Small PFO w/ L->R shunt 02/2017 Echo: EF 45-50%, diff HK. Gr1 DD. Mildly reduced RV fxn. Mild RAE 09/2022 Echo: EF 55-60%, mod LVH, GrI DD, nl RV fxn, mildly dil RA, mild MR.  Home Medications Prior to Admission medications   Medication Sig Start Date End Date Taking? Authorizing Provider  acetaminophen (TYLENOL) 650 MG CR tablet Take 650 mg by mouth every 8 (eight) hours as needed for pain.     [provider]  aspirin EC 81 MG EC tablet Take 1 tablet (81 mg total) by mouth daily. 09/03/17   Gold, Glenice Laine, PA-C  atorvastatin (LIPITOR) 80 MG tablet Take 1 tablet by mouth once daily 02/08/23   Creig Hines, NP  carvedilol (COREG) 12.5 MG tablet TAKE 1 TABLET BY MOUTH TWICE DAILY WITH A MEAL 03/10/23   End, Cristal Deer, MD  lidocaine-prilocaine (EMLA) cream Apply on the port. 30 -45 min  prior to port access. 10/08/22   Earna Coder, MD  losartan (COZAAR) 100 MG tablet Take 1 tablet by mouth once daily 03/10/23   End, Cristal Deer, MD  magic mouthwash (multi-ingredient) oral suspension Swish and spit 5-10 mLs 4 (four) times daily as needed. 11/24/22   Earna Coder, MD  Multiple Vitamin (MULTIVITAMIN WITH MINERALS) TABS tablet Take 1 tablet by mouth daily. One a Day over 50/ lunch    [provider]  nitroGLYCERIN (NITROSTAT) 0.4 MG SL tablet DISSOLVE ONE TABLET UNDER THE TONGUE EVERY 5 MINUTES AS NEEDED FOR CHEST PAIN.  DO NOT EXCEED A TOTAL OF 3 DOSES IN 15 MINUTES 03/24/23   Creig Hines, NP  OLANZapine (ZYPREXA) 5 MG tablet Take one pill at night. 11/03/22   Earna Coder, MD  ondansetron (ZOFRAN) 8 MG tablet One pill every 8 hours as needed for nausea/vomitting. 10/08/22   Earna Coder, MD  polyethylene glycol (MIRALAX) 17 g packet  Take 17 g by mouth daily. 11/07/22   Romeo Apple, Myah A, PA-C  prochlorperazine (COMPAZINE) 10 MG tablet Take 1 tablet (10 mg total) by mouth every 6 (six) hours as needed for nausea or vomiting. 10/08/22   Earna Coder, MD  senna-docusate (SENOKOT-S) 8.6-50 MG tablet Take 1 tablet by mouth daily. 11/07/22   Romeo Apple, Myah A, PA-C  sucralfate (CARAFATE) 1 g tablet Take 1 tablet (1 g total) by mouth 4 (four) times daily -  with meals and at bedtime. Dissolve in 4 tablespoons warm water 30 minutes before meals. 10/26/22   Carmina Miller, MD  sulfamethoxazole-trimethoprim (BACTRIM DS) 800-160  MG tablet Take 1 tablet by mouth 2 (two) times daily for 10 days. 03/29/23 04/08/23  Becky Augusta, NP  torsemide (DEMADEX) 20 MG tablet Take 1 tablet (20 mg total) by mouth daily as needed (swelling and weight gain). 11/25/22 02/23/23  Creig Hines, NP  traMADol HCl 100 MG TABS Take 100 mg by mouth every 8 (eight) hours as needed. 10/26/22   Earna Coder, MD    Past Medical History: Past Medical History:  Diagnosis Date   Alternating RBBB & LBBB    Ankle swelling 2024   Arthritis    neck   Bladder tumor    CAD (coronary artery disease)    a. 08/2017 STEMI/Cath: LM 80ost/d, LAD 100p, LCX 95 ost/p, RCA 60p, 48m, RPDA 80ost; b. 08/2017 CABG x 3: LIMA->LAD, VG->OM, VG->PDA.   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 08/2017 TEE: EF 30-35%, sept/ant/inf HK, apical AK. 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 12/2017 Echo: EF 45-50%, diff HK. Gr1 DD. Mildly reduced RV fxn. Mild RAE; c. 09/2022 Echo: EF 55-60%, mod LVH, GrI DD, nl RV fxn, mildly dil RA, mild MR.   Hyperlipidemia LDL goal <70    Ischemic cardiomyopathy    a. 08/2017 TEE: EF 30-35%; b. 12/2017 Echo: EF 45-50%; c. 09/2022 Echo: EF 55-60%.   Myocardial infarction (HCC) 08/27/2017   Shingles    2018 - right side   Vertigo    several months ago    Past Surgical History: Past Surgical History:  Procedure Laterality Date   CARDIAC CATHETERIZATION     CATARACT EXTRACTION W/PHACO Left 07/18/2018   Procedure: CATARACT EXTRACTION PHACO AND INTRAOCULAR LENS PLACEMENT (IOC)  LEFT;  Surgeon: Nevada Crane, MD;  Location: [facility];  Service: Ophthalmology;  Laterality: Left;   CATARACT EXTRACTION W/PHACO Right 08/14/2018   Procedure: CATARACT EXTRACTION PHACO AND INTRAOCULAR LENS PLACEMENT (IOC)  RIGHT;  Surgeon: Nevada Crane, MD;  Location: [facility];  Service: Ophthalmology;  Laterality:  Right;   CORONARY ARTERY BYPASS GRAFT N/A 08/27/2017   Procedure: CORONARY ARTERY BYPASS GRAFTING (CABG) ON PUMP USING LEFT INTERNAL MAMMARY ARTERY AND LEFT GREATER SAPHENOUS VEIN VIA ENDOVEIN HARVEST;  Surgeon: Alleen Borne, MD;  Location: [facility] OR;  Service: Open Heart Surgery;  Laterality: N/A;   CORONARY/GRAFT ACUTE MI REVASCULARIZATION N/A 08/27/2017   Procedure: Coronary/Graft Acute MI Revascularization;  Surgeon: Iran Ouch, MD;  Location: [facility];  Service: Cardiovascular;  Laterality: N/A;   CYSTOSCOPY W/ RETROGRADES Bilateral 12/25/2018   Procedure: CYSTOSCOPY WITH RETROGRADE PYELOGRAM;  Surgeon: Vanna Scotland, MD;  Location: [facility] ORS;  Service: Urology;  Laterality: Bilateral;   FRACTURE SURGERY Right 1958   arm  and left wrist compound fracture, no metal   HERNIA REPAIR Right    inguinial   IR IMAGING GUIDED PORT INSERTION  10/15/2022   LEFT HEART CATH AND CORONARY ANGIOGRAPHY N/A 08/27/2017   Procedure: LEFT HEART CATH AND CORONARY ANGIOGRAPHY;  Surgeon: Iran Ouch, MD;  Location: [facility] IN[facility] CV [facility];  Service: Cardiovascular;  Laterality: N/A;   TRANSURETHRAL RESECTION OF BLADDER TUMOR WITH MITOMYCIN-C N/A 12/25/2018   Procedure: TRANSURETHRAL RESECTION OF BLADDER TUMOR WITH Gemcitabine;  Surgeon: Vanna Scotland, MD;  Location: [facility] OR[facility];  Service: Urology;  Laterality: N/A;    Family History: Family History  Problem Relation Age of Onset   Heart failure Mother    Lung disease Father    Stroke Father    Cervical cancer Maternal Grandmother     Social History: Social History   Socioeconomic History   Marital status: Single    Spouse name: Not on file   Number of children: Not on file   Years of education: Not on file   Highest education level: Not on file  Occupational History   Occupation: supervision, Research scientist (life sciences)    Comment: retired  Tobacco Use   Smoking status: Former    Current packs/day: 0.00    Types: Cigarettes    Quit  date: 2000    Years since quitting: 25.1   Smokeless tobacco: Current    Types: Snuff  Vaping Use   Vaping status: Never Used  Substance and Sexual Activity   Alcohol use: Not Currently   Drug use: Not Currently   Sexual activity: Not on file  Other Topics Concern   Not on file  Social History Narrative   Lives alone   Social Drivers of Health   Financial Resource Strain: Low Risk  (11/06/2018)   Overall Financial Resource Strain (CARDIA)    Difficulty of Paying Living Expenses: Not hard at all  Food Insecurity: No Food Insecurity (09/10/2022)   Hunger Vital Sign    Worried About Running Out of Food in the Last Year: Never true    Ran Out of Food in the Last Year: Never true  Transportation Needs: Not on file (09/07/2022)  Physical Activity: Inactive (11/06/2018)   Exercise Vital Sign    Days of Exercise per Week: 0 days    Minutes of Exercise per Session: 0 min  Stress: No Stress Concern Present (11/06/2018)   [employer] of Occupational Health - Occupational Stress Questionnaire    Feeling of Stress : Not at all  Social Connections: Socially Isolated (11/06/2018)   Social Connection and Isolation Panel [NHANES]    Frequency of Communication with Friends and Family: Once a week    Frequency of Social Gatherings with Friends and Family: Once a week    Attends Religious Services: Never    Database administrator or Organizations: No    Attends Engineer, structural: Never    Marital Status: Never married    Allergies:  No Known Allergies  Objective:    Vital Signs:   Pulse Rate:  [74-103] 74 (02/11 1344) Resp:  [18-54] 18 (02/11 1344) BP: (155-160)/(61-73) 160/73 (02/11 1344) SpO2:  [70 %-81 %] 81 % (02/11 1344)    Weight change: There were no vitals filed for this visit.  Intake/Output:  No intake or output data in the 24 hours ending 04/05/23 1414    Physical Exam    General:  intubated HEENT: +ETT Neck: supple. JVD (UTA). Carotids 2+ bilat; no  bruits. Cor:  PMI nondisplaced. Regular rate & rhythm. No rubs, gallops or murmurs. +lucas in place Lungs: diminished Abdomen: obese, soft, nontender, nondistended. No hepatosplenomegaly. No bruits or masses.  Extremities: no cyanosis, clubbing, rash, cool extremities Neuro: intubated and sedated  Telemetry   PEA> paced in 70s (Personally reviewed)    EKG    NSR 75 bpm  Labs   Basic Metabolic Panel: Recent Labs  Lab 04/05/23 1251  NA 139  K 5.1  CL 101  CO2 16*  GLUCOSE 227*  BUN 36*  CREATININE 2.32*  CALCIUM 9.2    Liver Function Tests: Recent Labs  Lab 04/05/23 1251  AST 83*  ALT 134*  ALKPHOS 99  BILITOT 0.8  PROT 6.8  ALBUMIN 3.5   No results for input(s): "LIPASE", "AMYLASE" in the last 168 hours. No results for input(s): "AMMONIA" in the last 168 hours.  CBC: Recent Labs  Lab 04/05/23 1251  WBC 13.7*  HGB 12.5*  HCT 40.8  MCV 108.8*  PLT 306    Cardiac Enzymes: No results for input(s): "CKTOTAL", "CKMB", "CKMBINDEX", "TROPONINI" in the last 168 hours.  BNP: BNP (last 3 results) No results for input(s): "BNP" in the last 8760 hours.  ProBNP (last 3 results) No results for input(s): "PROBNP" in the last 8760 hours.   CBG: No results for input(s): "GLUCAP" in the last 168 hours.  Coagulation Studies: No results for input(s): "LABPROT", "INR" in the last 72 hours.   Imaging   No results found.   Medications:     Current Medications:  calcium chloride       calcium chloride       calcium chloride       EPINEPHrine       EPINEPHrine NaCl       sodium bicarbonate       sodium bicarbonate       sodium bicarbonate        Infusions:   Patient Profile   Cory Hess is a 78 y.o. 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 AHF team to see post cardiac arrest.   Assessment/Plan  Cardiac  arrest  - witnessed cardiac arrest today while in [place] urgent care waiting room>transferred to [facility] - CPR started immediately by bystander with EMD arrival within 30 seconds - ROSC achieved numerous times but he would keep losing his pulse. Pulse checks showed PEA, asystole and CHB.  - ROSC achieved, now being transcutaneously paced for CHB post ROSC - Taken to cath lab by Dr. Okey Dupre. Plan for TCP placement and LHC.  - May need MCS - HsTrop 21 - K 5.1>6.1 on iStat  Shock - Lactic acid >9 - suspect metabolic/septic etiology - continue to trend LA, last one was 5.5 - Will continue to follow if MCS needed - Blood cultures / UA / UC / resp panel  HFimpEF, iCM - 09/2022 Echo: EF 55-60%, mod LVH, GrI DD, nl RV fxn, mildly dil RA, mild MR. - see full echo history above in HPI. (EF 35% pre CABG) - EF ~40% on POCUS post arrest - NYHA IV on admission - Unable to assess volume at this time, will reassess post cath - will wait to add GDMT - Update echo  CAD - Hx nSTEMI 59'   - Now s/p CABG x3 19' - Continue ASA/ statin - LHC today  Alternating  BBB - hx of IVCD.  - RBBB on EKG today - Plan for TVP placement today   HTN - stable, off epi - holding home GDMT - can use epi / NE is hypotensive  HLD - LDL 45 2/23 - update lipid panel - Continue statin  Cancer  - Per primary  Length of Stay: 0  Alen Bleacher, NP  04/05/2023, 2:14 PM  Advanced Heart Failure Team Pager [phone] (M-F; 7a - 5p)  Please contact [facility] for night-coverage after hours (4p -7a ) and weekends on [email or web address]

## 2023-04-05 NOTE — Progress Notes (Signed)
PHARMACY - ANTICOAGULATION CONSULT NOTE  Pharmacy Consult for Heparin drip Indication: empirically treat for pulmonary embolism with IV heparin, to begin 2 hours after hemostasis was achieved at the right femoral arteriotomy with Mynx deployment. - per Cardiology note 04/05/23  No Known Allergies  Patient Measurements: Height: 6' (182.9 cm) Weight: 104.9 kg (231 lb 4.2 oz) IBW/kg (Calculated) : 77.6 Heparin Dosing Weight: 99 kg  Vital Signs: Temp: 94.8 F (34.9 C) (02/11 1819) Temp Source: Core (02/11 1715) BP: 81/58 (02/11 1819) Pulse Rate: 110 (02/11 1819)  Labs: Recent Labs    04/05/23 1251 04/05/23 1423 04/05/23 1458 04/05/23 1459 04/05/23 1658 04/05/23 1703  HGB 12.5* 11.2* 10.9* 10.9*  --   --   HCT 40.8 33.0* 32.0* 32.0*  --   --   PLT 306  --   --   --   --   --   APTT  --   --   --   --   --  37*  LABPROT  --   --   --   --   --  17.4*  INR  --   --   --   --   --  1.4*  CREATININE 2.32*  --   --   --   --  2.33*  TROPONINIHS 21*  --   --   --  898*  --     Estimated Creatinine Clearance: 33.8 mL/min (A) (by C-G formula based on SCr of 2.33 mg/dL (H)).   Medical History: Past Medical History:  Diagnosis Date   Alternating RBBB & LBBB    Ankle swelling 2024   Arthritis    neck   Bladder tumor    CAD (coronary artery disease)    a. 08/2017 STEMI/Cath: LM 80ost/d, LAD 100p, LCX 95 ost/p, RCA 60p, 40m, RPDA 80ost; b. 08/2017 CABG x 3: LIMA->LAD, VG->OM, VG->PDA.   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 08/2017 TEE: EF 30-35%, sept/ant/inf HK, apical AK. 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 12/2017 Echo: EF 45-50%, diff HK. Gr1 DD. Mildly reduced RV fxn. Mild RAE; c. 09/2022 Echo: EF 55-60%, mod LVH, GrI DD, nl RV fxn, mildly dil RA, mild MR.   Hyperlipidemia LDL goal <70    Ischemic cardiomyopathy    a. 08/2017 TEE: EF 30-35%; b. 12/2017 Echo: EF  45-50%; c. 09/2022 Echo: EF 55-60%.   Myocardial infarction (HCC) 08/27/2017   Shingles    2018 - right side   Vertigo    several months ago    Medications:  Medications Prior to Admission  Medication Sig Dispense Refill Last Dose/Taking   acetaminophen (TYLENOL) 650 MG CR tablet Take 650 mg by mouth every 8 (eight) hours as needed for pain.      aspirin EC 81 MG EC tablet Take 1 tablet (81 mg total) by mouth daily.      atorvastatin (LIPITOR) 80 MG tablet Take 1 tablet by mouth once daily 90 tablet 0    carvedilol (COREG) 12.5 MG tablet TAKE 1 TABLET BY MOUTH TWICE DAILY WITH A MEAL 180 tablet 0    lidocaine-prilocaine (EMLA) cream Apply on the port. 30 -45 min  prior to port access. 30 g 3    losartan (COZAAR) 100 MG tablet Take 1 tablet by mouth once daily 90 tablet 0  magic mouthwash (multi-ingredient) oral suspension Swish and spit 5-10 mLs 4 (four) times daily as needed. 480 mL 1    Multiple Vitamin (MULTIVITAMIN WITH MINERALS) TABS tablet Take 1 tablet by mouth daily. One a Day over 50/ lunch      nitroGLYCERIN (NITROSTAT) 0.4 MG SL tablet DISSOLVE ONE TABLET UNDER THE TONGUE EVERY 5 MINUTES AS NEEDED FOR CHEST PAIN.  DO NOT EXCEED A TOTAL OF 3 DOSES IN 15 MINUTES 25 tablet 0    OLANZapine (ZYPREXA) 5 MG tablet Take one pill at night. 30 tablet 0    ondansetron (ZOFRAN) 8 MG tablet One pill every 8 hours as needed for nausea/vomitting. 40 tablet 1    polyethylene glycol (MIRALAX) 17 g packet Take 17 g by mouth daily. 30 each 0    prochlorperazine (COMPAZINE) 10 MG tablet Take 1 tablet (10 mg total) by mouth every 6 (six) hours as needed for nausea or vomiting. 40 tablet 1    senna-docusate (SENOKOT-S) 8.6-50 MG tablet Take 1 tablet by mouth daily. 30 tablet 0    sucralfate (CARAFATE) 1 g tablet Take 1 tablet (1 g total) by mouth 4 (four) times daily -  with meals and at bedtime. Dissolve in 4 tablespoons warm water 30 minutes before meals. 90 tablet 1     sulfamethoxazole-trimethoprim (BACTRIM DS) 800-160 MG tablet Take 1 tablet by mouth 2 (two) times daily for 10 days. 20 tablet 0    torsemide (DEMADEX) 20 MG tablet Take 1 tablet (20 mg total) by mouth daily as needed (swelling and weight gain). 30 tablet 3    traMADol HCl 100 MG TABS Take 100 mg by mouth every 8 (eight) hours as needed. 30 tablet 0    Scheduled:   [START ON 04/06/2023] aspirin EC  81 mg Oral Daily   [START ON 04/06/2023] atorvastatin  80 mg Oral Daily   calcium chloride       calcium chloride       calcium chloride       Chlorhexidine Gluconate Cloth  6 each Topical Daily   insulin aspart  10 Units Intravenous Once   And   dextrose  1 ampule Intravenous Once   docusate  100 mg Per Tube BID   EPINEPHrine       EPINEPHrine NaCl       fentaNYL (SUBLIMAZE) injection  25 mcg Intravenous Once   insulin aspart  0-6 Units Subcutaneous Q4H   polyethylene glycol  17 g Per Tube Daily   sodium bicarbonate       sodium bicarbonate       sodium bicarbonate       sodium chloride flush  3 mL Intravenous Q12H   Infusions:   sodium chloride Stopped (04/05/23 1726)   sodium chloride     sodium chloride     sodium chloride 10 mL/hr at 04/05/23 1732   sodium chloride 10 mL/hr at 04/05/23 1732   cefTRIAXone (ROCEPHIN)  IV     famotidine (PEPCID) IV     fentaNYL infusion INTRAVENOUS 25 mcg/hr (04/05/23 1740)   heparin     midazolam     norepinephrine (LEVOPHED) Adult infusion      Assessment: 78 yo M to start heparin drip to empirically treat for pulmonary embolism with IV heparin, to begin 2 hours after hemostasis was achieved at the right femoral arteriotomy with Mynx deployment.  Pt with Cardiac arrest, complete heart block, and cardiogenic shock s/p cardiac cath: showed severe native CAD with patent  LIMA-LAD and SVG-OM. SVG-RCA is occluded, though this does not appear acute.  Pulmonary embolism is also a consideration given active cancer.  Baseline labs: Hgb 12.8 Plt 277  INR  1.4  aPTT 37  (did receive some heparin in cath) No anticoagulation PTA per Med rec and Rx fill hx, chart search.    Goal of Therapy:  Heparin level 0.3-0.7 units/ml Monitor platelets by anticoagulation protocol: Yes   Plan:  Per consult plan to start Heparin drip 2 hrs after Mynx deployment if no evidence of hematoma/bleeding  Start heparin infusion at 1600 units/hr Check anti-Xa level in 8 hours and daily while on heparin Continue to monitor H&H and platelets  Tashonna Descoteaux A 04/05/2023,6:34 PM

## 2023-04-05 NOTE — Progress Notes (Signed)
1645- Pt arrival to unit from heart catheterization. Pt placed on monitor. Full assessment complete, see flow sheets. Erasmo Downer NP, and Dr Larinda Buttery to bedside with Dr End for pt evaluation. Plan to wean Epinephrine drip to 14mcg/min, and then to off if BP stable.  1758- Pt into wide complex tachycardia from previously paced ventricular rhythm. Erasmo Downer, NP and Dr Larinda Buttery to bedside. Epinephrine drip restarted, with plan to wean to off and to change to Levophed drip. Dr End contacted by Dr Larinda Buttery for update on pt condition.

## 2023-04-05 NOTE — H&P (Signed)
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  He presented to [facility] ER via EMS from [facility] following a witnessed cardiac arrest.  Bystander CPR initiated and EMS arrived on the scene within 30 seconds cardiac rhythm asystole.  ROSC initially achieved, however pt in and out of PEA/asystole.    Pt recently evaluated in the ER on 02/4 with urinary urgency and frequency that started 5 days prior to that presentation.  Diagnosed with a UTI and prescribed Bactrim DS twice daily for 10 days.  However, symptoms persisted prompting [facility] visit today.  ED Course  Upon arrival to the ER pt on NRB with pulse.  It was reported during chest compressions the pt began to fight and pulling at medical devices.  While in the the ER waiting room the pt cardiac arrested again requiring ACLS protocol and mechanical intubation.  Pt with varied cardiac rhythm during cardiac arrest PEA/asystole/complete heart block.  He required transcutaneously pacing.  EKG showed apparent sinus rhythm with complete heart block and ventricular escape rhythm with LBBB morphology.  Total downtime estimated 2hrs from out of hospital cardiac arrest and inpatient cardiac arrest.  Pt required epinephrine gtt due to bradycardia and hypotension.  Pt taken emergently to the cardiac cath lab for temporary transvenous pacemaker placement, emergent cardiac catheterization, and swan-ganz catheter placement.   Cardiac cath revealed severe native CAD with patent LIMA-LAD and SVG-OM. SVG-RCA is occluded, but did not appear  acute and unlikely the inciting factor for today's cardiac arrest.  Per cardiology if pt makes a meaningful recovery he may require revascularization of the RCA.  Pt admitted to the ICU post cardiac cath PCCM consulted to asume care.    Pertinent  Medical History  Alternating RBBB & LBBB  Arthritis  Bladder Tumor  CAD s/p CABG 08/2017 Essential HTN  HFimpEF  HLD Ischemic Cardiomyopathy  MI Left tonsil cancer-stage II HPV with metastatic lymphadenopathy s/p chemo/radiation (dx 08/2022)  Significant Hospital Events: Including procedures, antibiotic start and stop dates in addition to other pertinent events   02/11: Pt admitted mechanically intubated post cardiac arrest s/p cardiac catheterization with swan-gaze catheter and emergent transvenous catheter placement  Interim History / Subjective:  Pt able to follow commands remains mechanically intubated vent settings PEEP 14/FiO2 100% initially hypoxic upon arrival to ICU with O2 sats 70 to 80%.  However, O2 sats increased to 93 to 94%.  Pt hypothermic temp 94.8.  BP stable currently weaning epinephrine gtt off.  CVP 10-11 and PAP 39/23.     Objective   Blood pressure (!) 160/73, pulse 74, resp. rate 18, SpO2 (!) 81%.    Vent Mode: PRVC FiO2 (%):  [100 %] 100 % Set Rate:  [22 bmp-26 bmp] 26 bmp Vt Set:  [450 mL] 450 mL PEEP:  [14 cmH20] 14 cmH20   Intake/Output Summary (Last 24 hours) at 04/05/2023 1559 Last data filed at 04/05/2023 1533 Gross per 24 hour  Intake --  Output 500 ml  Net -500 ml   There were no vitals filed for this visit.  Examination: General: Acute on  chronically-ill appearing male, NAD mechanically intubated  HENT: Supple, mild JVD  Lungs: Faint rhonchi throughout, even, non labored  Cardiovascular: Paced rhythm, no m/r/g, 2+ radial/1+ distal pulses, bilateral extremities cool to touch  Abdomen: +BS x4, obese, soft non distended  Extremities: Moves all extremities  Neuro: Awake, following commands, PERRL   GU: Indwelling foley catheter draining yellow urine   Resolved Hospital Problem list     Assessment & Plan:   #Acute metabolic encephalopathy  #Mechanical intubation pain/discomfort  - Avoid sedating medications as able  - Maintain RASS goal 0 to -1 - PAD protocol to maintain RASS goal: fentanyl gtt and prn versed  - WUA daily   #Cardiac arrest  #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 SVG-RCA is occluded, but did not appear acute and unlikely the inciting factor for today's cardiac arrest - Continuous telemetry monitoring  - Trend cooxy, vbg, lactic acid, and troponin's - Cardiology consulted appreciate input: recommending if pt has a meaningful recovery may require revascularization of the RCA and  if junctional rhythm persist start amiodarone gtt - Heparin gtt per cardiology recommendations - Prn levophed gtt to maintain map 65 or higher  - Advance heart failure team following along: left filling pressures mildly elevated; right heart filling pressures moderately elevated will continue pulmonary artery catheter for continuous hemodynamic monitoring - Pt received 40 mg of iv lasix during cardiac catheterization will gently diurese as bp and renal function permits - GDMT on hold for now  - Echo pending  - Thyroid panel pending   #Acute hypoxic hypercapnic respiratory failure  #Possible PE #Aspiration pneumonia secondary to acute encephalopathy post cardiac arrest  #Mechanical intubation  - Full vent support for now: vent settings reviewed and established - Maintain plateau pressures less than 30 cm H2O - Continue lung protective strategies  - VAP bundle implemented - Prn bronchodilator therapy  - Intermittent CXR and ABG's prn  - Heparin gtt: dosing per pharmacy  - Unable to perform CTA PE  Chest due to acute kidney injury consider lung VQ scan once stabilized to travel   #Acute kidney injury secondary to ATN  #Severe hyperkalemia  #Anion gap metabolic acidosis  - Trend BMP  - Replace electrolytes as indicating  - Shifting measures prn for hyperkalemia  - Strict I&O's - Avoid nephrotoxic agents as able   #UTI  #Aspiration pneumonia  - Trend WBC and monitor fever curve - Trend PCT  - Follow cultures  - Continue ceftriaxone pending culture results and sensitivities   #Transaminitis  - Trend hepatic function panel  - Avoid hepatotoxic medications as able   #Hyperglycemia  - Hemoglobin A1c pending  - CBG's q4hrs  - Very sensitive SSI  - Follow hyper/hypoglycemic protocol  - Target CBG readings 140 to 180   Best Practice (right click and "Reselect all SmartList Selections" daily)   Diet/type: NPO DVT prophylaxis systemic heparin Pressure ulcer(s): N/A GI prophylaxis: H2B Lines: Swan-ganz catheter and still needed; portacath present on admission  Foley:  Yes, and it is still needed Code Status:  full code Last date of multidisciplinary goals of care discussion [N/A]  Pts family updated extensively by ICU Intensivist Dr. Sula Soda at pts bedside  Labs   CBC: Recent Labs  Lab 04/05/23 1251 04/05/23 1423 04/05/23 1458 04/05/23 1459  WBC 13.7*  --   --   --  HGB 12.5* 11.2* 10.9* 10.9*  HCT 40.8 33.0* 32.0* 32.0*  MCV 108.8*  --   --   --   PLT 306  --   --   --     Basic Metabolic Panel: Recent Labs  Lab 04/05/23 1251 04/05/23 1423 04/05/23 1458 04/05/23 1459  NA 139 141 137 141  K 5.1 6.1* 6.2* 6.3*  CL 101  --   --   --   CO2 16*  --   --   --   GLUCOSE 227*  --   --   --   BUN 36*  --   --   --   CREATININE 2.32*  --   --   --   CALCIUM 9.2  --   --   --    GFR: Estimated Creatinine Clearance: 34.5 mL/min (A) (by C-G formula based on SCr of 2.32 mg/dL (H)). Recent Labs  Lab 04/05/23 1251 04/05/23 1428  WBC 13.7*  --   LATICACIDVEN  >9.0* 5.5*    Liver Function Tests: Recent Labs  Lab 04/05/23 1251  AST 83*  ALT 134*  ALKPHOS 99  BILITOT 0.8  PROT 6.8  ALBUMIN 3.5   No results for input(s): "LIPASE", "AMYLASE" in the last 168 hours. No results for input(s): "AMMONIA" in the last 168 hours.  ABG    Component Value Date/Time   PHART 7.240 (L) 04/05/2023 1459   PCO2ART 72.4 (HH) 04/05/2023 1459   PO2ART 53 (L) 04/05/2023 1459   HCO3 31.0 (H) 04/05/2023 1459   TCO2 33 (H) 04/05/2023 1459   ACIDBASEDEF 1.0 04/05/2023 1423   O2SAT 80 04/05/2023 1459     Coagulation Profile: No results for input(s): "INR", "PROTIME" in the last 168 hours.  Cardiac Enzymes: No results for input(s): "CKTOTAL", "CKMB", "CKMBINDEX", "TROPONINI" in the last 168 hours.  HbA1C: Hgb A1c MFr Bld  Date/Time Value Ref Range Status  08/28/2017 02:27 AM 5.6 4.8 - 5.6 % Final    Comment:    (NOTE) Pre diabetes:          5.7%-6.4% Diabetes:              >6.4% Glycemic control for   <7.0% adults with diabetes     CBG: No results for input(s): "GLUCAP" in the last 168 hours.  Review of Systems:   Unable to assess pt mechanically intubated   Past Medical History:  He,  has a past medical history of Alternating RBBB & LBBB, Ankle swelling (2024), Arthritis, Bladder tumor, CAD (coronary artery disease), Cancer (HCC), Cancer of base of tongue (HCC), Essential hypertension, 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   Surgical History:   Past Surgical History:  Procedure Laterality Date   CARDIAC CATHETERIZATION     CATARACT EXTRACTION W/PHACO Left 07/18/2018   Procedure: CATARACT EXTRACTION PHACO AND INTRAOCULAR LENS PLACEMENT (IOC)  LEFT;  Surgeon: Nevada Crane, MD;  Location: [facility];  Service: Ophthalmology;  Laterality: Left;   CATARACT EXTRACTION W/PHACO Right  08/14/2018   Procedure: CATARACT EXTRACTION PHACO AND INTRAOCULAR LENS PLACEMENT (IOC)  RIGHT;  Surgeon: Nevada Crane, MD;  Location: [facility];  Service: Ophthalmology;  Laterality: Right;   CORONARY ARTERY BYPASS GRAFT N/A 08/27/2017   Procedure: CORONARY ARTERY BYPASS GRAFTING (CABG) ON PUMP USING LEFT INTERNAL MAMMARY ARTERY AND LEFT GREATER SAPHENOUS VEIN VIA ENDOVEIN HARVEST;  Surgeon: Alleen Borne, MD;  Location: [facility] OR;  Service: Open Heart Surgery;  Laterality: N/A;   CORONARY/GRAFT ACUTE MI REVASCULARIZATION N/A 08/27/2017   Procedure: Coronary/Graft Acute MI Revascularization;  Surgeon: Iran Ouch, MD;  Location: [facility];  Service: Cardiovascular;  Laterality: N/A;   CYSTOSCOPY W/ RETROGRADES Bilateral 12/25/2018   Procedure: CYSTOSCOPY WITH RETROGRADE PYELOGRAM;  Surgeon: Vanna Scotland, MD;  Location: [facility];  Service: Urology;  Laterality: Bilateral;   FRACTURE SURGERY Right 1958   arm and left wrist compound fracture, no metal   HERNIA REPAIR Right    inguinial   IR IMAGING GUIDED PORT INSERTION  10/15/2022   LEFT HEART CATH AND CORONARY ANGIOGRAPHY N/A 08/27/2017   Procedure: LEFT HEART CATH AND CORONARY ANGIOGRAPHY;  Surgeon: Iran Ouch, MD;  Location: [facility];  Service: Cardiovascular;  Laterality: N/A;   TRANSURETHRAL RESECTION OF BLADDER TUMOR WITH MITOMYCIN-C N/A 12/25/2018   Procedure: TRANSURETHRAL RESECTION OF BLADDER TUMOR WITH Gemcitabine;  Surgeon: Vanna Scotland, MD;  Location: [facility];  Service: Urology;  Laterality: N/A;     Social History:   reports that he quit smoking about 25 years ago. His smoking use included cigarettes. His smokeless tobacco use includes snuff. He reports that he does not currently use alcohol. He reports that he does not currently use drugs.   Family History:  His family history includes Cervical cancer in his maternal grandmother; Heart failure in his mother; Lung disease in his father;  Stroke in his father.   Allergies No Known Allergies   Home Medications  Prior to Admission medications   Medication Sig Start Date End Date Taking? Authorizing Provider  acetaminophen (TYLENOL) 650 MG CR tablet Take 650 mg by mouth every 8 (eight) hours as needed for pain.    [provider]  aspirin EC 81 MG EC tablet Take 1 tablet (81 mg total) by mouth daily. 09/03/17   Gold, Glenice Laine, PA-C  atorvastatin (LIPITOR) 80 MG tablet Take 1 tablet by mouth once daily 02/08/23   Creig Hines, NP  carvedilol (COREG) 12.5 MG tablet TAKE 1 TABLET BY MOUTH TWICE DAILY WITH A MEAL 03/10/23   End, Cristal Deer, MD  lidocaine-prilocaine (EMLA) cream Apply on the port. 30 -45 min  prior to port access. 10/08/22   Earna Coder, MD  losartan (COZAAR) 100 MG tablet Take 1 tablet by mouth once daily 03/10/23   End, Cristal Deer, MD  magic mouthwash (multi-ingredient) oral suspension Swish and spit 5-10 mLs 4 (four) times daily as needed. 11/24/22   Earna Coder, MD  Multiple Vitamin (MULTIVITAMIN WITH MINERALS) TABS tablet Take 1 tablet by mouth daily. One a Day over 50/ lunch    [provider]  nitroGLYCERIN (NITROSTAT) 0.4 MG SL tablet DISSOLVE ONE TABLET UNDER THE TONGUE EVERY 5 MINUTES AS NEEDED FOR CHEST PAIN.  DO NOT EXCEED A TOTAL OF 3 DOSES IN 15 MINUTES 03/24/23   Creig Hines, NP  OLANZapine (ZYPREXA) 5 MG tablet Take one pill at night. 11/03/22   Earna Coder, MD  ondansetron (ZOFRAN) 8 MG tablet One pill every 8 hours as needed for nausea/vomitting. 10/08/22   Earna Coder, MD  polyethylene glycol (MIRALAX) 17 g packet Take 17 g by mouth daily. 11/07/22   Romeo Apple, Myah A, PA-C  prochlorperazine (COMPAZINE) 10 MG tablet Take 1 tablet (10 mg total) by mouth every 6 (six) hours as needed for nausea or vomiting. 10/08/22   Earna Coder, MD  senna-docusate (SENOKOT-S) 8.6-50  MG tablet Take 1 tablet by mouth daily. 11/07/22    Romeo Apple, Myah A, PA-C  sucralfate (CARAFATE) 1 g tablet Take 1 tablet (1 g total) by mouth 4 (four) times daily -  with meals and at bedtime. Dissolve in 4 tablespoons warm water 30 minutes before meals. 10/26/22   Carmina Miller, MD  sulfamethoxazole-trimethoprim (BACTRIM DS) 800-160 MG tablet Take 1 tablet by mouth 2 (two) times daily for 10 days. 03/29/23 04/08/23  Becky Augusta, NP  torsemide (DEMADEX) 20 MG tablet Take 1 tablet (20 mg total) by mouth daily as needed (swelling and weight gain). 11/25/22 02/23/23  Creig Hines, NP  traMADol HCl 100 MG TABS Take 100 mg by mouth every 8 (eight) hours as needed. 10/26/22   Earna Coder, MD     Critical care time: 80 minutes      Zada Girt, AGNP  Pulmonary/Critical Care Pager [phone] (please enter 7 digits) PCCM Consult Pager [phone] (please enter 7 digits)

## 2023-04-05 NOTE — Progress Notes (Signed)
   04/05/23 1320  Spiritual Encounters  Type of Visit Initial  Care provided to: Family  Conversation partners present during encounter Nurse;Physician  Referral source Code page  Reason for visit Code  OnCall Visit Yes  Interventions  Spiritual Care Interventions Made Established relationship of care and support;Compassionate presence;Prayer;Encouragement  Intervention Outcomes  Outcomes Connection to spiritual care;Awareness around self/spiritual resourses  Spiritual Care Plan  Spiritual Care Issues Still Outstanding Referring to oncoming chaplain for further support

## 2023-04-05 NOTE — Consult Note (Addendum)
Cardiology Consultation   Patient ID: Aris Even MRN: [id number]; DOB: [birth year]  Admit date: 04/05/2023 Date of Consult: 04/05/2023  PCP:  Patient, No Pcp Per   [facility] Providers Cardiologist:  Dossie Arbour, MD PhD   Patient Profile:   Tatem Fesler is a 78 y.o. 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 Arnoldo Morale.  History of Present Illness:   Mr. Omdahl is intubated and unresponsive at the time of my evaluation.  History is obtained from the ED staff and the patient's chart.  He presented to urgent care this morning for further evaluation of a UTI for which she had been on antibiotics.  While in the waiting room, he became unresponsive.  CPR was started promptly by bystanders.  EMS arrived quite quickly and took over CPR.  Initial rhythm was reportedly asystole.  He was brought to the ED where CPR continued.  Intermittent ROSC was achieved though he would become pulseless again within a few seconds to minutes.  I was asked to see him when he had already been undergoing resuscitation for approximately 90 minutes.  EKGs showed apparent sinus rhythm with complete heart block and ventricular escape rhythm with LBB morphology.  Transcutaneous pacing was initiated with maintenance of a pulse.  Patient was then taken to cardiac Cath Lab for temporary transvenous pacemaker placement and emergent cardiac catheterization.   Past Medical History:  Diagnosis Date   Alternating RBBB & LBBB    Ankle swelling 2024   Arthritis    neck   Bladder tumor    CAD (coronary artery disease)    a. 08/2017 STEMI/Cath: LM 80ost/d, LAD 100p, LCX 95 ost/p, RCA 60p, 40m, RPDA 80ost; b. 08/2017 CABG x 3: LIMA->LAD, VG->OM, VG->PDA.   Cancer [facility])     bladder tumor   Cancer of base of tongue (HCC)    Essential hypertension    HFimpEF (heart failure with improved ejection fraction) (HCC)    a. 08/2017 TEE: EF 30-35%, sept/ant/inf HK, apical AK. 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 12/2017 Echo: EF 45-50%, diff HK. Gr1 DD. Mildly reduced RV fxn. Mild RAE; c. 09/2022 Echo: EF 55-60%, mod LVH, GrI DD, nl RV fxn, mildly dil RA, mild MR.   Hyperlipidemia LDL goal <70    Ischemic cardiomyopathy    a. 08/2017 TEE: EF 30-35%; b. 12/2017 Echo: EF 45-50%; c. 09/2022 Echo: EF 55-60%.   Myocardial infarction (HCC) 08/27/2017   Shingles    2018 - right side   Vertigo    several months ago    Past Surgical History:  Procedure Laterality Date   CARDIAC CATHETERIZATION     CATARACT EXTRACTION W/PHACO Left 07/18/2018   Procedure: CATARACT EXTRACTION PHACO AND INTRAOCULAR LENS PLACEMENT (IOC)  LEFT;  Surgeon: Nevada Crane, MD;  Location: [facility];  Service: Ophthalmology;  Laterality: Left;   CATARACT EXTRACTION W/PHACO Right 08/14/2018   Procedure: CATARACT EXTRACTION PHACO AND INTRAOCULAR LENS PLACEMENT (IOC)  RIGHT;  Surgeon: Nevada Crane, MD;  Location: [facility];  Service: Ophthalmology;  Laterality: Right;   CORONARY ARTERY BYPASS GRAFT N/A 08/27/2017   Procedure: CORONARY ARTERY BYPASS GRAFTING (CABG) ON PUMP USING LEFT INTERNAL MAMMARY ARTERY AND LEFT GREATER SAPHENOUS VEIN VIA ENDOVEIN HARVEST;  Surgeon: Evelene Croon  K, MD;  Location: [facility] OR;  Service: Open Heart Surgery;  Laterality: N/A;   CORONARY/GRAFT ACUTE MI REVASCULARIZATION N/A 08/27/2017   Procedure: Coronary/Graft Acute MI Revascularization;  Surgeon: Iran Ouch, MD;  Location: [facility];  Service: Cardiovascular;  Laterality: N/A;   CYSTOSCOPY W/ RETROGRADES Bilateral 12/25/2018   Procedure: CYSTOSCOPY WITH RETROGRADE PYELOGRAM;  Surgeon: Vanna Scotland, MD;  Location: [facility];  Service: Urology;  Laterality: Bilateral;   FRACTURE SURGERY Right  1958   arm and left wrist compound fracture, no metal   HERNIA REPAIR Right    inguinial   IR IMAGING GUIDED PORT INSERTION  10/15/2022   LEFT HEART CATH AND CORONARY ANGIOGRAPHY N/A 08/27/2017   Procedure: LEFT HEART CATH AND CORONARY ANGIOGRAPHY;  Surgeon: Iran Ouch, MD;  Location: [facility] CV [facility];  Service: Cardiovascular;  Laterality: N/A;   TRANSURETHRAL RESECTION OF BLADDER TUMOR WITH MITOMYCIN-C N/A 12/25/2018   Procedure: TRANSURETHRAL RESECTION OF BLADDER TUMOR WITH Gemcitabine;  Surgeon: Vanna Scotland, MD;  Location: [facility];  Service: Urology;  Laterality: N/A;     Home Medications:  Prior to Admission medications   Medication Sig Start Date Jamyra Zweig Date Taking? Authorizing Provider  acetaminophen (TYLENOL) 650 MG CR tablet Take 650 mg by mouth every 8 (eight) hours as needed for pain.    [provider]  aspirin EC 81 MG EC tablet Take 1 tablet (81 mg total) by mouth daily. 09/03/17   Gold, Glenice Laine, PA-C  atorvastatin (LIPITOR) 80 MG tablet Take 1 tablet by mouth once daily 02/08/23   Creig Hines, NP  carvedilol (COREG) 12.5 MG tablet TAKE 1 TABLET BY MOUTH TWICE DAILY WITH A MEAL 03/10/23   Rebel Willcutt, Cristal Deer, MD  lidocaine-prilocaine (EMLA) cream Apply on the port. 30 -45 min  prior to port access. 10/08/22   Earna Coder, MD  losartan (COZAAR) 100 MG tablet Take 1 tablet by mouth once daily 03/10/23   Dyan Creelman, Cristal Deer, MD  magic mouthwash (multi-ingredient) oral suspension Swish and spit 5-10 mLs 4 (four) times daily as needed. 11/24/22   Earna Coder, MD  Multiple Vitamin (MULTIVITAMIN WITH MINERALS) TABS tablet Take 1 tablet by mouth daily. One a Day over 50/ lunch    [provider]  nitroGLYCERIN (NITROSTAT) 0.4 MG SL tablet DISSOLVE ONE TABLET UNDER THE TONGUE EVERY 5 MINUTES AS NEEDED FOR CHEST PAIN.  DO NOT EXCEED A TOTAL OF 3 DOSES IN 15 MINUTES 03/24/23   Creig Hines, NP  OLANZapine (ZYPREXA) 5 MG tablet  Take one pill at night. 11/03/22   Earna Coder, MD  ondansetron (ZOFRAN) 8 MG tablet One pill every 8 hours as needed for nausea/vomitting. 10/08/22   Earna Coder, MD  polyethylene glycol (MIRALAX) 17 g packet Take 17 g by mouth daily. 11/07/22   Romeo Apple, Myah A, PA-C  prochlorperazine (COMPAZINE) 10 MG tablet Take 1 tablet (10 mg total) by mouth every 6 (six) hours as needed for nausea or vomiting. 10/08/22   Earna Coder, MD  senna-docusate (SENOKOT-S) 8.6-50 MG tablet Take 1 tablet by mouth daily. 11/07/22   Romeo Apple, Myah A, PA-C  sucralfate (CARAFATE) 1 g tablet Take 1 tablet (1 g total) by mouth 4 (four) times daily -  with meals and at bedtime. Dissolve in 4 tablespoons warm water 30 minutes before meals. 10/26/22   Carmina Miller, MD  sulfamethoxazole-trimethoprim (BACTRIM DS) 800-160 MG tablet Take 1 tablet by mouth 2 (two) times daily for 10 days.  03/29/23 04/08/23  Becky Augusta, NP  torsemide (DEMADEX) 20 MG tablet Take 1 tablet (20 mg total) by mouth daily as needed (swelling and weight gain). 11/25/22 02/23/23  Creig Hines, NP  traMADol HCl 100 MG TABS Take 100 mg by mouth every 8 (eight) hours as needed. 10/26/22   Earna Coder, MD    Inpatient Medications: Scheduled Meds:  calcium chloride       calcium chloride       calcium chloride       EPINEPHrine       EPINEPHrine NaCl       sodium bicarbonate       sodium bicarbonate       sodium bicarbonate       Continuous Infusions:  sodium chloride 10 mL/hr (04/05/23 1500)   PRN Meds: sodium chloride, calcium chloride, calcium chloride, calcium chloride, EPINEPHrine, EPINEPHrine NaCl, fentaNYL, furosemide, Heparin (Porcine) in NaCl, iohexol, lidocaine (PF), midazolam, sodium bicarbonate, sodium bicarbonate, sodium bicarbonate, sodium bicarbonate  Allergies:   No Known Allergies  Social History:   Social History   Socioeconomic History   Marital status: Single    Spouse name: Not on  file   Number of children: Not on file   Years of education: Not on file   Highest education level: Not on file  Occupational History   Occupation: supervision, Designer, fashion/clothing and Holiday representative    Comment: retired  Tobacco Use   Smoking status: Former    Current packs/day: 0.00    Types: Cigarettes    Quit date: 2000    Years since quitting: 25.1   Smokeless tobacco: Current    Types: Snuff  Vaping Use   Vaping status: Never Used  Substance and Sexual Activity   Alcohol use: Not Currently   Drug use: Not Currently   Sexual activity: Not on file  Other Topics Concern   Not on file  Social History Narrative   Lives alone   Social Drivers of Health   Financial Resource Strain: Low Risk  (11/06/2018)   Overall Financial Resource Strain (CARDIA)    Difficulty of Paying Living Expenses: Not hard at all  Food Insecurity: No Food Insecurity (09/10/2022)   Hunger Vital Sign    Worried About Running Out of Food in the Last Year: Never true    Ran Out of Food in the Last Year: Never true  Transportation Needs: Not on file (09/07/2022)  Physical Activity: Inactive (11/06/2018)   Exercise Vital Sign    Days of Exercise per Week: 0 days    Minutes of Exercise per Session: 0 min  Stress: No Stress Concern Present (11/06/2018)   [employer] of Occupational Health - Occupational Stress Questionnaire    Feeling of Stress : Not at all  Social Connections: Socially Isolated (11/06/2018)   Social Connection and Isolation Panel [NHANES]    Frequency of Communication with Friends and Family: Once a week    Frequency of Social Gatherings with Friends and Family: Once a week    Attends Religious Services: Never    Database administrator or Organizations: No    Attends Banker Meetings: Never    Marital Status: Never married  Intimate Partner Violence: Not At Risk (09/10/2022)   Humiliation, Afraid, Rape, and Kick questionnaire    Fear of Current or Ex-Partner: No    Emotionally  Abused: No    Physically Abused: No    Sexually Abused: No    Family History:   Family History  Problem Relation Age of Onset   Heart failure Mother    Lung disease Father    Stroke Father    Cervical cancer Maternal Grandmother      ROS:  Unable to obtain as the patient is intubated and unresponsive.  Physical Exam/Data:   Vitals:   04/05/23 1330 04/05/23 1344 04/05/23 1511  BP: (!) 155/61 (!) 160/73   Pulse: (!) 103 74   Resp: (!) 54 18   SpO2: (!) 70% (!) 81% (!) 81%    Intake/Output Summary (Last 24 hours) at 04/05/2023 1628 Last data filed at 04/05/2023 1533 Gross per 24 hour  Intake --  Output 500 ml  Net -500 ml      03/29/2023    1:18 PM 01/14/2023    9:21 AM 01/06/2023    9:55 AM  Last 3 Weights  Weight (lbs) 240 lb 224 lb 222 lb  Weight (kg) 108.863 kg 101.606 kg 100.699 kg     There is no height or weight on file to calculate BMI.  General: Unresponsive man, intubated and receiving CPR on initial evaluation. HEENT: Endotracheal tube in place Neck: Unable to assess JVP due to positioning and support devices. Vascular: 2+ radial and femoral pulses bilaterally with transcutaneous pacing. Cardiac: Distant heart sounds.  Regular rate and rhythm without murmurs. Lungs: Coarse sounds bilaterally. Abd: soft, nontender, no hepatomegaly  Ext: no edema Musculoskeletal:  No deformitie. Skin: Cool and dry. Neuro: Intubated and unresponsive. Psych: Unable to assess as the patient was intubated and unresponsive.  EKG: Multiple EKGs were performed today demonstrating sinus rhythm with complete heart block and alternating left and right bundle branch QRS morphology. Telemetry:  Telemetry was personally reviewed and demonstrates: Normal sinus rhythm with complete heart block.  Relevant CV Studies: Bedside echocardiogram shows severely reduced LVEF.  Overall technically difficult study.  Laboratory Data:  High Sensitivity Troponin:   Recent Labs  Lab  04/05/23 1251  TROPONINIHS 21*     Chemistry Recent Labs  Lab 04/05/23 1251 04/05/23 1423 04/05/23 1458 04/05/23 1459  NA 139 141 137 141  K 5.1 6.1* 6.2* 6.3*  CL 101  --   --   --   CO2 16*  --   --   --   GLUCOSE 227*  --   --   --   BUN 36*  --   --   --   CREATININE 2.32*  --   --   --   CALCIUM 9.2  --   --   --   GFRNONAA 28*  --   --   --   ANIONGAP 22*  --   --   --     Recent Labs  Lab 04/05/23 1251  PROT 6.8  ALBUMIN 3.5  AST 83*  ALT 134*  ALKPHOS 99  BILITOT 0.8   Lipids No results for input(s): "CHOL", "TRIG", "HDL", "LABVLDL", "LDLCALC", "CHOLHDL" in the last 168 hours.  Hematology Recent Labs  Lab 04/05/23 1251 04/05/23 1423 04/05/23 1458 04/05/23 1459  WBC 13.7*  --   --   --   RBC 3.75*  --   --   --   HGB 12.5* 11.2* 10.9* 10.9*  HCT 40.8 33.0* 32.0* 32.0*  MCV 108.8*  --   --   --   MCH 33.3  --   --   --   MCHC 30.6  --   --   --   RDW 11.4*  --   --   --  PLT 306  --   --   --    Thyroid No results for input(s): "TSH", "FREET4" in the last 168 hours.  BNPNo results for input(s): "BNP", "PROBNP" in the last 168 hours.  DDimer No results for input(s): "DDIMER" in the last 168 hours.   Radiology/Studies:  DG Chest Portable 1 View Result Date: 04/05/2023 CLINICAL DATA:  Post intubation EXAM: PORTABLE CHEST 1 VIEW COMPARISON:  11/11/2018 FINDINGS: Right-sided central venous port tip at the SVC. Post sternotomy changes. Endotracheal tube tip is about 5 cm superior to the carina. Cardiomegaly. No significant effusion. Asymmetrical diffuse airspace disease in the right thorax compared to the left. Some vascular congestion is noted. IMPRESSION: 1. Endotracheal tube tip about 5 cm superior to the carina. 2. Cardiomegaly with vascular congestion. Asymmetrical diffuse airspace disease in the right thorax compared to the left, question asymmetric edema or pneumonia. Electronically Signed   By: Jasmine Pang M.D.   On: 04/05/2023 15:34      Assessment and Plan:   Cardiac arrest, complete heart block, and cardiogenic shock: Etiology for cardiac arrest is.  Patient has a history of conduction disease and was noted complete heart block during today's code.  It is possible that further degeneration of his conduction system contributed to this.  However, pulmonary embolism is also a consideration, particularly given his severe hypoxia despite aggressive oxygenation following intubation.  He also has active cancer.  I was LVEF is severely reduced and he has severe underlying CAD, his degree of hypoxia seems to be out of proportion to this.  Catheterization showed severe native CAD with patent LIMA-LAD and SVG-OM.  SVG-RCA is occluded, though this does not appear acute.  He may ultimately require revascularization of the RCA if he makes meaningful recovery, though I do not believe this was the inciting factor for today's event.  Continue weaning epinephrine infusion as blood pressure allows.  Placement of arterial line for continuous blood pressure monitoring should be considered; will defer this to the CCM team.  Continue close monitoring of rhythm with temporary transvenous pacemaker in place.  Stat portable chest x-ray ordered to confirm pacemaker placement.  Avoid AV nodal blocking agents.  Acute respiratory failure with hypoxia: Severe hypoxia with marked AA gradient noted.  Some of this could be pulmonary edema and lung trauma from CPR.  Pulmonary embolism is also a consideration given active cancer.  I will diurese gently.  Further workup per critical care team.  I think it would be prudent to empirically treat for pulmonary embolism with IV heparin, to begin 2 hours after hemostasis was achieved at the right femoral arteriotomy with Mynx deployment.  Acute kidney injury superimposed on chronic kidney disease: I suspect this is due to cardiac arrest and acute illness, though the patient has been dealing with urinary symptoms and was  being treated for UTI.  He received contrast during today's catheterization, which may lead to CIN.  I will diurese gently given marked hypoxia and mildly elevated PCWP/LVEDP.  Avoid further nephrotoxic agents if possible.  Coronary artery disease: I suspect coronary artery disease findings on catheterization today are chronic.  As above, we will initiate heparin.  Continue aspirin and statin therapy.  Acute on chronic HFrEF: LVEF severely reduced by left ventriculogram.  Left heart filling pressures mildly elevated; right heart filling pressures moderately elevated.  We will diurese gently; patient received furosemide 40 mg IV x 1 during catheterization.  GDMT on hold at this time given that the patient remains  on epinephrine drip for heart rate and blood pressure control.  Pulmonary artery catheter in place for continuous hemodynamic monitoring.  Appreciate assistance of advanced heart failure team with ongoing management.  CRITICAL CARE Performed by: Yvonne Kendall, MD  Total critical care time: 50 minutes  Critical care time was exclusive of separately billable procedures and treating other patients.  Critical care was necessary to treat or prevent imminent or life-threatening deterioration.  Critical care was time spent personally by me (independent of midlevel providers or residents) on the following activities: development of treatment plan with patient and/or surrogate as well as nursing, discussions with consultants, evaluation of patient's response to treatment, examination of patient, obtaining history from patient or surrogate, ordering and performing treatments and interventions, ordering and review of laboratory studies, ordering and review of radiographic studies, pulse oximetry and re-evaluation of patient's condition.  For questions or updates, please contact [facility] Please consult [email or web address] for contact info under [facility].  Signed, Yvonne Kendall,  MD  04/05/2023 4:28 PM

## 2023-04-05 NOTE — ED Provider Notes (Signed)
I assisted in the initial approximately 20 minutes of resuscitative care for this patient.  Primary treatment under  the care of Dr. Arnoldo Morale.  I responded when a CODE BLUE was paged overhead.  On arrival, CPR being performed patient showing spontaneous movements and respirations during compressions but PEA with no pulses during pulse checks and he stops moving/signs of life when pulse checks occur.  CPR continued and multiple treatments given including treatments for potential hyperkalemia as directed by Dr. Arnoldo Morale.  At approximately 12:50 PM, I left the resuscitation as the patient now has airway secured, pulse present and strong right radial and femoral pulses affirmed by me with Dr. Arnoldo Morale advising he is managing further.  Unfortunately the patient is having intermittent episodes of PEA.  However he does show return of circulation intermittently after compressions and medication administrations. Airway affirmed, placed by Dr. Arnoldo Morale, I noted + coloimetric, direct visual of ETT through cords seen by me on glidescope, breathsounds bilateral luns none over epigastrium. Lung sounds are course with rhonchi centrally noted.     Sharyn Creamer, MD 04/05/23 [phone]

## 2023-04-05 NOTE — ED Provider Notes (Signed)
Called to waiting room for possible seizure.  Patient unresponsive, pale, with agonal breathing.  No pulse felt or heartbeat heard on auscultation.  The team positioned him on the floor, and started CPR/BLS.  AED applied, BVM started, IV access obtained.  No shock advised through 3 cycles of CPR.  Patient had no pulse until EMS arrived, where he was asystolic on the monitor.  Continued CPR, advanced airway inserted by BLS.  Patient became intermittently responsive, and a pulse was eventually felt with rhythm on the monitor.  No shocks given.  Patient transported to the ED in critical condition.   Domenick Gong, MD 04/05/23 1236

## 2023-04-05 NOTE — ED Notes (Signed)
Within minutes of transferring from EMS stretcher to ER stretcher, pt pulseless, in asystole. CPR initiated at approximately 1230. 1mg  Epinepherine given, followed by 1g calcium, and of sodium bicarbonate. Pulse regained at 1239 with NSR. Pt pulseless again at 1241 and CPR initiated. 1mg  epinepherine administered, followed by 1g calcium and sodium bicarbonate in that sequence. Pulses returned at 1243. Epinepherine infusion initiated at 41mcg/min per verbal order from Dr Arnoldo Morale. Infusion increased to 65mcg/min at 1245. Pt pulseless again at 1246. 1mg  epinepherine IVP, followed by 1g calcium and sodium bicarb given in that sequence at 1247. Pulses returned at 1248. At 1249, Dr Arnoldo Morale performed RSI using 20mg  Etomidate and 100mg  Rocuronium. 7.5cm ETT secured 23cm at lip, confirmed with bilateral breath sounds and color change capnography. Pt pulseless and CPR initiated at 1252. 1mg  epinephrine given IVP. Epinephrine infusion increased to 47mcg/min per Dr Arnoldo Morale. 1g calcium IVP given followed by sodium bicarb IVP given. Pulses returned briefly at 1258, but CPR resumed at 1259 due to asystole/pulseless. Epinephrine infusion increased to 65mcg/min per Dr Arnoldo Morale. 1mg  epinephrine IVP given, followed by 1g calcium and sodium bicarb IVP. At 1304, pulses returned with NSR. Pt pulseless/asystolic at 1306 and CPR initiated. 1mg  epinephrine IVP given, followed by 1g calcium and sodium bicarb at 1307. 1308 rhythm check revealed complete HB. At 1310, rhythm asystole, cpr resumed. Dr End (cardiology) at bedside at 1312. ROSC at 1317 and 1mg  epinephrine IVP given simultaneously. Pt pulseless and asystole at 1324- CPR initiated. 1mg  epinephrine IVP given, followed by 1g calcium and sodium bicarb. 1332 pt in PEA, CPR resumed. 1335 pulse/rhythm check revealed complete HB. Pt placed on transcutaneous pacing at a rate of 70bpm, with electrical and mechanical capture. Epinephrine infusion  increased to 54mcg/min.   Pt transferred to cath lab at 1350.

## 2023-04-05 NOTE — ED Provider Notes (Signed)
[facility] Provider Note    Event Date/Time   First MD Initiated Contact with Patient 04/05/23 1303     (approximate)   History   Cardiac Arrest   HPI  Cory Hess is a 78 y.o. male presents to the emergency department after a witnessed arrest.  Patient report was given by EMS.  Patient went to urgent care and [place] to check in for symptoms of a urinary tract infection.  Was in the lobby had a witnessed arrest by bystanders.  EMS was outside in the lobby and immediately available.  Patient was initially in asystole and pulseless and received chest compressions with EMS within 30 seconds of arresting.  Combative with EMS chest compressions.  Obtained ROSC after approximately 30 minutes with EMS.  Ripped out his eye gel and route.     Physical Exam   Triage Vital Signs: ED Triage Vitals  Encounter Vitals Group     BP      Systolic BP Percentile      Diastolic BP Percentile      Pulse      Resp      Temp      Temp src      SpO2      Weight      Height      Head Circumference      Peak Flow      Pain Score      Pain Loc      Pain Education      Exclude from Growth Chart     Most recent vital signs: Vitals:   04/05/23 1344 04/05/23 1511  BP: (!) 160/73   Pulse: 74   Resp: 18   SpO2: (!) 81% (!) 81%    Physical Exam Constitutional:      Appearance: He is well-developed. He is ill-appearing.  HENT:     Head: Atraumatic.  Eyes:     Extraocular Movements: Extraocular movements intact.     Conjunctiva/sclera: Conjunctivae normal.  Cardiovascular:     Rate and Rhythm: Rhythm irregular.  Pulmonary:     Breath sounds: Rhonchi present.  Abdominal:     General: There is no distension.  Musculoskeletal:     Right lower leg: No edema.     Left lower leg: No edema.     Comments: +2 radial and DP pulses that are equal bilaterally  Skin:    General: Skin is warm.     Capillary Refill: Capillary refill takes 2 to 3 seconds.      IMPRESSION / MDM / ASSESSMENT AND PLAN / ED COURSE  I reviewed the triage vital signs and the nursing notes.  Patient had a witnessed cardiac arrest.  Initial rhythm was asystole.  3 minutes of CPR with ROSC prior to arrival.  Initially when patient arrived patient did have a good pulse however approximately 2 minutes later lost pulses again and became bradycardic.  Initial rhythm was asystole.  Chest compressions with multiple rounds of epinephrine, bicarb and calcium given significantly irregular rhythm that appeared wide and possible electrolyte abnormality.  Patient achieved ROSC multiple times and then approximately 2 minutes later would lose his pulses again.  Started on epi drip.  Cardiology consulted for postarrest.  EKG  I, Corena Herter, the attending physician, personally viewed and interpreted this ECG.  Multiple EKGs concerning for wide-complex left bundle branch block that was significantly irregular.  Difficult to discern P waves and determine if he was in complete  heart block.  RADIOLOGY I independently reviewed imaging, my interpretation of imaging: Chest x-ray without obvious pneumothorax  LABS (all labs ordered are listed, but only abnormal results are displayed) Labs interpreted as -    Labs Reviewed  CBC - Abnormal; Notable for the following components:      Result Value   WBC 13.7 (*)    RBC 3.75 (*)    Hemoglobin 12.5 (*)    MCV 108.8 (*)    RDW 11.4 (*)    All other components within normal limits  COMPREHENSIVE METABOLIC PANEL - Abnormal; Notable for the following components:   CO2 16 (*)    Glucose, Bld 227 (*)    BUN 36 (*)    Creatinine, Ser 2.32 (*)    AST 83 (*)    ALT 134 (*)    GFR, Estimated 28 (*)    Anion gap 22 (*)    All other components within normal limits  LACTIC ACID, PLASMA - Abnormal; Notable for the following components:   Lactic Acid, Venous >9.0 (*)    All other components within normal limits  POCT I-STAT 7, (LYTES,  BLD GAS, ICA,H+H) - Abnormal; Notable for the following components:   pH, Arterial 7.154 (*)    pCO2 arterial 83.0 (*)    pO2, Arterial 54 (*)    Bicarbonate 29.2 (*)    Potassium 6.1 (*)    Calcium, Ion 1.87 (*)    HCT 33.0 (*)    Hemoglobin 11.2 (*)    All other components within normal limits  CG4 I-STAT (LACTIC ACID) - Abnormal; Notable for the following components:   Lactic Acid, Venous 5.5 (*)    All other components within normal limits  POCT I-STAT 7, (LYTES, BLD GAS, ICA,H+H) - Abnormal; Notable for the following components:   pH, Arterial 7.240 (*)    pCO2 arterial 72.4 (*)    pO2, Arterial 53 (*)    Bicarbonate 31.0 (*)    TCO2 33 (*)    Potassium 6.3 (*)    Calcium, Ion 1.71 (*)    HCT 32.0 (*)    Hemoglobin 10.9 (*)    All other components within normal limits  POCT I-STAT EG7 - Abnormal; Notable for the following components:   pH, Ven 7.191 (*)    pCO2, Ven 80.3 (*)    Bicarbonate 30.7 (*)    TCO2 33 (*)    Potassium 6.2 (*)    Calcium, Ion 1.70 (*)    HCT 32.0 (*)    Hemoglobin 10.9 (*)    All other components within normal limits  TROPONIN I (HIGH SENSITIVITY) - Abnormal; Notable for the following components:   Troponin I (High Sensitivity) 21 (*)    All other components within normal limits  LACTIC ACID, PLASMA  MAGNESIUM  COMPREHENSIVE METABOLIC PANEL  TROPONIN I (HIGH SENSITIVITY)     MDM  Patient was intubated.  Ultimately concern for possible complete heart block.  Lab work with significantly elevated lactic acid greater than 9.  Potassium was 5.1.  Does have an acute kidney injury.  Ultimately able to achieve ROSC.  Consulted Dr. Okey Dupre with cardiology who evaluated the patient in the emergency department.  Transcutaneous pacing and ultimately patient was taken up to the cardiac catheterization lab for transvenous pacing.  Discussed critical nature of the patient with his Sister Cory Hess over the telephone.     PROCEDURES:  Critical Care performed:  No  CPR  Date/Time: 04/05/2023 4:18 PM  Performed  by: Corena Herter, MD Authorized by: Corena Herter, MD  CPR Procedure Details:      Amount of time prior to administration of ACLS/BLS (minutes):  3   ACLS/BLS initiated by EMS: Yes     CPR/ACLS performed in the ED: Yes     Duration of CPR (minutes):  60   Outcome: ROSC obtained    CPR performed via ACLS guidelines under my direct supervision.  See RN documentation for details including defibrillator use, medications, doses and timing. Comments:     Started on epi drip.  Received multiple doses of IV epinephrine, bicarb and calcium Procedure Name: Intubation Date/Time: 04/05/2023 4:19 PM  Performed by: Corena Herter, MDPre-anesthesia Checklist: Suction available, Emergency Drugs available, Patient identified, Patient being monitored and Timeout performed Oxygen Delivery Method: Ambu bag Preoxygenation: Pre-oxygenation with 100% oxygen Induction Type: Rapid sequence Ventilation: Mask ventilation without difficulty Laryngoscope Size: Glidescope and 3 Grade View: Grade I Tube size: 7.5 mm Number of attempts: 1 Airway Equipment and Method: Video-laryngoscopy Placement Confirmation: ETT inserted through vocal cords under direct vision, Positive ETCO2, CO2 detector and Breath sounds checked- equal and bilateral Secured at: 23 cm Tube secured with: ETT holder Dental Injury: Teeth and Oropharynx as per pre-operative assessment  Future Recommendations: Recommend- induction with short-acting agent, and alternative techniques readily available    External pacer  Date/Time: 04/05/2023 4:19 PM  Performed by: Corena Herter, MD Authorized by: Corena Herter, MD  Consent: The procedure was performed in an emergent situation. Local anesthesia used: no  Anesthesia: Local anesthesia used: no  Sedation: Patient sedated: no  Comments: Transcutaneous pacing with good mechanical capture.   .Critical Care  Performed by: Corena Herter, MD Authorized by: Corena Herter, MD   Critical care provider statement:    Critical care time (minutes):  45   Critical care time was exclusive of:  Separately billable procedures and treating other patients   Critical care was necessary to treat or prevent imminent or life-threatening deterioration of the following conditions:  Cardiac failure   Critical care was time spent personally by me on the following activities:  Development of treatment plan with patient or surrogate, discussions with consultants, evaluation of patient's response to treatment, examination of patient, ordering and review of laboratory studies, ordering and review of radiographic studies, ordering and performing treatments and interventions, pulse oximetry, re-evaluation of patient's condition and review of old charts   Care discussed with: admitting provider     Patient's presentation is most consistent with acute presentation with potential threat to life or bodily function.   MEDICATIONS ORDERED IN ED: Medications  EPINEPHrine NaCl 5-0.9 MG/250ML-% premix infusion (10 mcg/min  Rate/Dose Change 04/05/23 1511)  calcium chloride 10 % injection (has no administration in time range)  sodium bicarbonate 1 mEq/mL injection (has no administration in time range)  EPINEPHrine (ADRENALIN) 1 MG/10ML injection (has no administration in time range)  calcium chloride 10 % injection (has no administration in time range)  sodium bicarbonate 1 mEq/mL injection (has no administration in time range)  calcium chloride 10 % injection (has no administration in time range)  sodium bicarbonate 1 mEq/mL injection (has no administration in time range)  Heparin (Porcine) in NaCl 1000-0.9 UT/500ML-% SOLN (1,000 mLs  Given 04/05/23 1409)  lidocaine (PF) (XYLOCAINE) 1 % injection (2 mLs Infiltration Given 04/05/23 1443)  0.9 %  sodium chloride infusion (10 mL/hr Intravenous New Bag/Given 04/05/23 1500)  fentaNYL (SUBLIMAZE) injection (25  mcg Intravenous Given 04/05/23 1554)  midazolam (VERSED) injection (1  mg Intravenous Given 04/05/23 1555)  sodium bicarbonate injection (50 mEq Intravenous Given 04/05/23 1427)  furosemide (LASIX) injection (40 mg Intravenous Given 04/05/23 1507)  iohexol (OMNIPAQUE) 300 MG/ML solution (62 mLs  Given 04/05/23 1541)    FINAL CLINICAL IMPRESSION(S) / ED DIAGNOSES   Final diagnoses:  Cardiac arrest [facility])     Rx / DC Orders   ED Discharge Orders     None        Note:  This document was prepared using Dragon voice recognition software and may include unintentional dictation errors.   Corena Herter, MD 04/05/23 1620

## 2023-04-05 NOTE — ED Triage Notes (Signed)
Pt arrives from [facility] urgent care via [facility] for witnessed cardiac arrest. Bystanders initiated CPR and EMS arrived on scene within 30 seconds. ROSC obtained but pt arrested again. During compressions, pt was fighting and pulling at medical devices, removed his iGel. Pt arrives on NRB, with pulse.

## 2023-04-06 ENCOUNTER — Encounter: Payer: Self-pay | Admitting: Internal Medicine

## 2023-04-06 ENCOUNTER — Inpatient Hospital Stay: Payer: Medicare Other

## 2023-04-06 ENCOUNTER — Other Ambulatory Visit: Payer: Medicare Other

## 2023-04-06 DIAGNOSIS — Z7189 Other specified counseling: Secondary | ICD-10-CM

## 2023-04-06 DIAGNOSIS — R570 Cardiogenic shock: Secondary | ICD-10-CM | POA: Diagnosis not present

## 2023-04-06 DIAGNOSIS — I5023 Acute on chronic systolic (congestive) heart failure: Secondary | ICD-10-CM | POA: Diagnosis not present

## 2023-04-06 DIAGNOSIS — I469 Cardiac arrest, cause unspecified: Secondary | ICD-10-CM | POA: Diagnosis not present

## 2023-04-06 DIAGNOSIS — N179 Acute kidney failure, unspecified: Secondary | ICD-10-CM | POA: Diagnosis present

## 2023-04-06 DIAGNOSIS — E875 Hyperkalemia: Secondary | ICD-10-CM | POA: Insufficient documentation

## 2023-04-06 DIAGNOSIS — G9341 Metabolic encephalopathy: Secondary | ICD-10-CM | POA: Diagnosis not present

## 2023-04-06 HISTORY — DX: Acute kidney failure, unspecified: N17.9

## 2023-04-06 LAB — BLOOD GAS, ARTERIAL
Acid-Base Excess: 0.8 mmol/L (ref 0.0–2.0)
Bicarbonate: 26.6 mmol/L (ref 20.0–28.0)
FIO2: 50 %
MECHVT: 450 mL
Mechanical Rate: 26
O2 Saturation: 99.2 %
PEEP: 12 cmH2O
Patient temperature: 37
pCO2 arterial: 46 mm[Hg] (ref 32–48)
pH, Arterial: 7.37 (ref 7.35–7.45)
pO2, Arterial: 90 mm[Hg] (ref 83–108)

## 2023-04-06 LAB — COOXEMETRY PANEL
Carboxyhemoglobin: 1.6 % — ABNORMAL HIGH (ref 0.5–1.5)
Carboxyhemoglobin: 2.3 % — ABNORMAL HIGH (ref 0.5–1.5)
Carboxyhemoglobin: 1.7 % — ABNORMAL HIGH (ref 0.5–1.5)
Carboxyhemoglobin: 2.4 % — ABNORMAL HIGH (ref 0.5–1.5)
Methemoglobin: 1.5 % (ref 0.0–1.5)
Methemoglobin: 1 % (ref 0.0–1.5)
Methemoglobin: 1 % (ref 0.0–1.5)
Methemoglobin: <0.7 % (ref 0.0–1.5)
O2 Saturation: 99.3 %
O2 Saturation: 84.5 %
O2 Saturation: 81.2 %
O2 Saturation: 74.4 %
Total hemoglobin: 13.6 g/dL (ref 12.0–16.0)
Total hemoglobin: 8.4 g/dL — ABNORMAL LOW (ref 12.0–16.0)
Total hemoglobin: 11.7 g/dL — ABNORMAL LOW (ref 12.0–16.0)
Total hemoglobin: 10.3 g/dL — ABNORMAL LOW (ref 12.0–16.0)
Total oxygen content: 96.1 %
Total oxygen content: 81.7 %
Total oxygen content: 79 %
Total oxygen content: 72.3 %

## 2023-04-06 LAB — BASIC METABOLIC PANEL
Anion gap: 11 (ref 5–15)
Anion gap: 10 (ref 5–15)
Anion gap: 8 (ref 5–15)
BUN: 47 mg/dL — ABNORMAL HIGH (ref 8–23)
BUN: 48 mg/dL — ABNORMAL HIGH (ref 8–23)
BUN: 49 mg/dL — ABNORMAL HIGH (ref 8–23)
CO2: 24 mmol/L (ref 22–32)
CO2: 22 mmol/L (ref 22–32)
CO2: 24 mmol/L (ref 22–32)
Calcium: 10.5 mg/dL — ABNORMAL HIGH (ref 8.9–10.3)
Calcium: 9.9 mg/dL (ref 8.9–10.3)
Calcium: 9.4 mg/dL (ref 8.9–10.3)
Chloride: 105 mmol/L (ref 98–111)
Chloride: 105 mmol/L (ref 98–111)
Chloride: 103 mmol/L (ref 98–111)
Creatinine, Ser: 2.78 mg/dL — ABNORMAL HIGH (ref 0.61–1.24)
Creatinine, Ser: 2.63 mg/dL — ABNORMAL HIGH (ref 0.61–1.24)
Creatinine, Ser: 2.42 mg/dL — ABNORMAL HIGH (ref 0.61–1.24)
GFR, Estimated: 23 mL/min — ABNORMAL LOW (ref >60)
GFR, Estimated: 24 mL/min — ABNORMAL LOW (ref >60)
GFR, Estimated: 27 mL/min — ABNORMAL LOW (ref >60)
Glucose, Bld: 162 mg/dL — ABNORMAL HIGH (ref 70–99)
Glucose, Bld: 225 mg/dL — ABNORMAL HIGH (ref 70–99)
Glucose, Bld: 165 mg/dL — ABNORMAL HIGH (ref 70–99)
Potassium: 7.1 mmol/L (ref 3.5–5.1)
Potassium: 5.3 mmol/L — ABNORMAL HIGH (ref 3.5–5.1)
Potassium: 5.3 mmol/L — ABNORMAL HIGH (ref 3.5–5.1)
Sodium: 140 mmol/L (ref 135–145)
Sodium: 137 mmol/L (ref 135–145)
Sodium: 135 mmol/L (ref 135–145)

## 2023-04-06 LAB — CBC
HCT: 38.2 % — ABNORMAL LOW (ref 39.0–52.0)
Hemoglobin: 13 g/dL (ref 13.0–17.0)
MCH: 33.2 pg (ref 26.0–34.0)
MCHC: 34 g/dL (ref 30.0–36.0)
MCV: 97.7 fL (ref 80.0–100.0)
Platelets: 291 10*3/uL (ref 150–400)
RBC: 3.91 MIL/uL — ABNORMAL LOW (ref 4.22–5.81)
RDW: 11.4 % — ABNORMAL LOW (ref 11.5–15.5)
WBC: 34.5 10*3/uL — ABNORMAL HIGH (ref 4.0–10.5)
nRBC: 0 % (ref 0.0–0.2)

## 2023-04-06 LAB — GLUCOSE, CAPILLARY
Glucose-Capillary: 147 mg/dL — ABNORMAL HIGH (ref 70–99)
Glucose-Capillary: 166 mg/dL — ABNORMAL HIGH (ref 70–99)
Glucose-Capillary: 178 mg/dL — ABNORMAL HIGH (ref 70–99)
Glucose-Capillary: 149 mg/dL — ABNORMAL HIGH (ref 70–99)
Glucose-Capillary: 131 mg/dL — ABNORMAL HIGH (ref 70–99)
Glucose-Capillary: 141 mg/dL — ABNORMAL HIGH (ref 70–99)
Glucose-Capillary: 159 mg/dL — ABNORMAL HIGH (ref 70–99)

## 2023-04-06 LAB — HEPARIN LEVEL (UNFRACTIONATED)
Heparin Unfractionated: >1.1 [IU]/mL — ABNORMAL HIGH (ref 0.30–0.70)
Heparin Unfractionated: 1.03 [IU]/mL — ABNORMAL HIGH (ref 0.30–0.70)

## 2023-04-06 LAB — BLOOD GAS, VENOUS
Acid-Base Excess: 2.8 mmol/L — ABNORMAL HIGH (ref 0.0–2.0)
Bicarbonate: 30.4 mmol/L — ABNORMAL HIGH (ref 20.0–28.0)
O2 Saturation: 55.4 %
Patient temperature: 37
pCO2, Ven: 59 mm[Hg] (ref 44–60)
pH, Ven: 7.32 (ref 7.25–7.43)
pO2, Ven: 33 mm[Hg] (ref 32–45)

## 2023-04-06 LAB — ELECTROLYTE PANEL
Anion gap: 10 (ref 5–15)
CO2: 23 mmol/L (ref 22–32)
Chloride: 106 mmol/L (ref 98–111)
Potassium: 5.6 mmol/L — ABNORMAL HIGH (ref 3.5–5.1)
Sodium: 139 mmol/L (ref 135–145)

## 2023-04-06 LAB — PROCALCITONIN
Procalcitonin: 0.28 ng/mL
Procalcitonin: 7.41 ng/mL

## 2023-04-06 LAB — TRIGLYCERIDES: Triglycerides: 110 mg/dL (ref <150)

## 2023-04-06 LAB — URINE CULTURE: Culture: NO GROWTH

## 2023-04-06 LAB — LACTIC ACID, PLASMA
Lactic Acid, Venous: 3 mmol/L (ref 0.5–1.9)
Lactic Acid, Venous: 3.7 mmol/L (ref 0.5–1.9)
Lactic Acid, Venous: 2.8 mmol/L (ref 0.5–1.9)

## 2023-04-06 LAB — MAGNESIUM: Magnesium: 2.1 mg/dL (ref 1.7–2.4)

## 2023-04-06 LAB — PHOSPHORUS: Phosphorus: 4 mg/dL (ref 2.5–4.6)

## 2023-04-06 MED ORDER — CALCIUM GLUCONATE-NACL 1-0.675 GM/50ML-% IV SOLN
1.0000 g | Freq: Once | INTRAVENOUS | Status: AC
  Administered 2023-04-06: 1000 mg via INTRAVENOUS
  Filled 2023-04-06: qty 50

## 2023-04-06 MED ORDER — INSULIN ASPART 100 UNIT/ML IJ SOLN
10.0000 [IU] | Freq: Once | INTRAMUSCULAR | Status: AC
  Administered 2023-04-06: 10 [IU] via INTRAVENOUS
  Filled 2023-04-06: qty 1

## 2023-04-06 MED ORDER — HEPARIN (PORCINE) 25000 UT/250ML-% IV SOLN
1300.0000 [IU]/h | INTRAVENOUS | Status: DC
Start: 2023-04-06 — End: 2023-04-06
  Administered 2023-04-06: 1300 [IU]/h via INTRAVENOUS
  Filled 2023-04-06: qty 250

## 2023-04-06 MED ORDER — PIPERACILLIN-TAZOBACTAM 3.375 G IVPB
3.3750 g | Freq: Three times a day (TID) | INTRAVENOUS | Status: DC
  Administered 2023-04-06 – 2023-04-10 (×14): 3.375 g via INTRAVENOUS
  Filled 2023-04-06 (×14): qty 50

## 2023-04-06 MED ORDER — DEXTROSE 50 % IV SOLN
1.0000 | Freq: Once | INTRAVENOUS | Status: AC
  Administered 2023-04-06: 50 mL via INTRAVENOUS
  Filled 2023-04-06: qty 50

## 2023-04-06 MED ORDER — ALBUTEROL SULFATE (2.5 MG/3ML) 0.083% IN NEBU
10.0000 mg | INHALATION_SOLUTION | Freq: Once | RESPIRATORY_TRACT | Status: AC
  Administered 2023-04-06: 10 mg via RESPIRATORY_TRACT
  Filled 2023-04-06: qty 12

## 2023-04-06 MED ORDER — SODIUM ZIRCONIUM CYCLOSILICATE 5 G PO PACK
10.0000 g | PACK | Freq: Three times a day (TID) | ORAL | Status: AC
Start: 2023-04-06 — End: 2023-04-06
  Administered 2023-04-06 (×3): 10 g
  Filled 2023-04-06 (×3): qty 2

## 2023-04-06 MED ORDER — PHENYLEPHRINE HCL-NACL 20-0.9 MG/250ML-% IV SOLN
0.0000 ug/min | INTRAVENOUS | Status: DC
  Filled 2023-04-06: qty 250

## 2023-04-06 MED ORDER — VANCOMYCIN HCL 2000 MG/400ML IV SOLN
2000.0000 mg | Freq: Once | INTRAVENOUS | Status: AC
  Administered 2023-04-06: 2000 mg via INTRAVENOUS
  Filled 2023-04-06: qty 400

## 2023-04-06 MED ORDER — EPINEPHRINE HCL 5 MG/250ML IV SOLN IN NS
6.0000 ug/min | INTRAVENOUS | Status: DC
Start: 2023-04-06 — End: 2023-04-09
  Administered 2023-04-06: 2 ug/min via INTRAVENOUS
  Administered 2023-04-06 – 2023-04-07 (×2): 6 ug/min via INTRAVENOUS
  Filled 2023-04-06 (×3): qty 250

## 2023-04-06 MED ORDER — HEPARIN (PORCINE) 25000 UT/250ML-% IV SOLN
1300.0000 [IU]/h | INTRAVENOUS | Status: DC
  Administered 2023-04-06: 1000 [IU]/h via INTRAVENOUS
  Administered 2023-04-07: 900 [IU]/h via INTRAVENOUS
  Administered 2023-04-08: 1300 [IU]/h via INTRAVENOUS
  Filled 2023-04-06 (×2): qty 250

## 2023-04-06 MED ORDER — ASPIRIN 81 MG PO CHEW
81.0000 mg | CHEWABLE_TABLET | Freq: Every day | ORAL | Status: DC
  Administered 2023-04-07 – 2023-04-21 (×13): 81 mg
  Filled 2023-04-06 (×13): qty 1

## 2023-04-06 MED ORDER — LACTATED RINGERS IV BOLUS
250.0000 mL | Freq: Once | INTRAVENOUS | Status: AC
  Administered 2023-04-06: 250 mL via INTRAVENOUS

## 2023-04-06 MED ORDER — INSULIN ASPART 100 UNIT/ML IV SOLN
10.0000 [IU] | Freq: Once | INTRAVENOUS | Status: AC
  Administered 2023-04-06: 10 [IU] via INTRAVENOUS
  Filled 2023-04-06: qty 0.1

## 2023-04-06 MED ORDER — SODIUM ZIRCONIUM CYCLOSILICATE 5 G PO PACK
10.0000 g | PACK | Freq: Three times a day (TID) | ORAL | Status: DC
Start: 2023-04-06 — End: 2023-04-08
  Administered 2023-04-06 – 2023-04-07 (×4): 10 g
  Filled 2023-04-06 (×4): qty 2

## 2023-04-06 MED ORDER — ATORVASTATIN CALCIUM 80 MG PO TABS: 80.0000 mg | ORAL_TABLET | Freq: Every day | ORAL | Status: DC

## 2023-04-06 MED ORDER — ACETAMINOPHEN 325 MG PO TABS: 650.0000 mg | ORAL_TABLET | ORAL | Status: DC | PRN

## 2023-04-06 MED ORDER — HYDROCORTISONE SOD SUC (PF) 100 MG IJ SOLR
100.0000 mg | Freq: Two times a day (BID) | INTRAMUSCULAR | Status: DC
Start: 2023-04-06 — End: 2023-04-09
  Administered 2023-04-06 – 2023-04-09 (×7): 100 mg via INTRAVENOUS
  Filled 2023-04-06 (×7): qty 2

## 2023-04-06 MED ORDER — SODIUM CHLORIDE 0.9 % IV BOLUS
250.0000 mL | Freq: Once | INTRAVENOUS | Status: AC
  Administered 2023-04-06: 250 mL via INTRAVENOUS

## 2023-04-06 NOTE — Consult Note (Signed)
Pharmacy Antibiotic Note  Cory Hess is a 78 y.o. male admitted on 04/05/2023 with sepsis. Patient presented to the ED post cardiac arrest and intubated. Currently on high pressor requirements. Pharmacy has been consulted for Vancomycin and Zosyn dosing.  Today, 04/06/2023 Scr 2.78 (baseline1.1)  WBC 34 Tmax 97  Plan: Day 1 of antibiotics Give vancomycin 2000 mg IV x1 Dose vancomycin by renal function due to ongoing AKI. Continue Zosyn 3.375 g IV Q8H Continue to monitor renal function and follow culture results  Height: 6' (182.9 cm) Weight: 104.9 kg (231 lb 4.2 oz) IBW/kg (Calculated) : 77.6  Temp (24hrs), Avg:95.9 F (35.5 C), Min:91.8 F (33.2 C), Max:97.7 F (36.5 C)  Recent Labs  Lab 04/05/23 1251 04/05/23 1428 04/05/23 1658 04/05/23 1703 04/05/23 1743 04/05/23 2035 04/05/23 2226 04/06/23 0316  WBC 13.7*  --   --   --  29.0*  --   --  34.5*  CREATININE 2.32*  --   --  2.33*  --   --  2.57* 2.78*  LATICACIDVEN >9.0* 5.5* 2.9*  --  2.9* 3.3*  --  3.0*    Estimated Creatinine Clearance: 28.3 mL/min (A) (by C-G formula based on SCr of 2.78 mg/dL (H)).    No Known Allergies  Antimicrobials this admission: Ceftriaxone 2g x 1 2/12 Zosyn >>  2/12 Vancomycin >>   Dose adjustments this admission: NA  Microbiology results: 2/11 BCx: IP 2/11 MRSA PCR: negative  Thank you for allowing pharmacy to be a part of this patient's care.  Effie Shy, PharmD Pharmacy Resident  04/06/2023 10:06 AM

## 2023-04-06 NOTE — Progress Notes (Signed)
PHARMACY - ANTICOAGULATION CONSULT NOTE  Pharmacy Consult for Heparin Drip Indication: empirically treat for pulmonary embolism with IV heparin, to begin 2 hours after hemostasis was achieved at the right femoral arteriotomy with Mynx deployment. - per Cardiology note 04/05/23  Patient Measurements: Height: 6' (182.9 cm) Weight: 104.9 kg (231 lb 4.2 oz) IBW/kg (Calculated) : 77.6 Heparin Dosing Weight: 99 kg  Labs: Recent Labs    04/05/23 1251 04/05/23 1423 04/05/23 1459 04/05/23 1658 04/05/23 1703 04/05/23 1743 04/05/23 2226 04/06/23 0316 04/06/23 1042 04/06/23 1408  HGB 12.5*   < > 10.9*  --   --  12.8*  --  13.0  --   --   HCT 40.8   < > 32.0*  --   --  37.3*  --  38.2*  --   --   PLT 306  --   --   --   --  277  --  291  --   --   APTT  --   --   --   --  37*  --   --   --   --   --   LABPROT  --   --   --   --  17.4*  --   --   --   --   --   INR  --   --   --   --  1.4*  --   --   --   --   --   HEPARINUNFRC  --   --   --   --   --   --   --  >1.10*  --  1.03*  CREATININE 2.32*  --   --   --  2.33*  --  2.57* 2.78* 2.63*  --   TROPONINIHS 21*  --   --  898*  --   --   --   --   --   --    < > = values in this interval not displayed.    Estimated Creatinine Clearance: 29.9 mL/min (A) (by C-G formula based on SCr of 2.63 mg/dL (H)).   Medical History: Past Medical History:  Diagnosis Date   Alternating RBBB & LBBB    Ankle swelling 2024   Arthritis    neck   Bladder tumor    CAD (coronary artery disease)    a. 08/2017 STEMI/Cath: LM 80ost/d, LAD 100p, LCX 95 ost/p, RCA 60p, 15m, RPDA 80ost; b. 08/2017 CABG x 3: LIMA->LAD, VG->OM, VG->PDA.   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 08/2017 TEE: EF 30-35%, sept/ant/inf HK, apical AK. 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 12/2017 Echo: EF 45-50%, diff HK. Gr1 DD. Mildly reduced RV fxn. Mild RAE; c. 09/2022  Echo: EF 55-60%, mod LVH, GrI DD, nl RV fxn, mildly dil RA, mild MR.   Hyperlipidemia LDL goal <70    Ischemic cardiomyopathy    a. 08/2017 TEE: EF 30-35%; b. 12/2017 Echo: EF 45-50%; c. 09/2022 Echo: EF 55-60%.   Myocardial infarction (HCC) 08/27/2017   Shingles    2018 - right side   Vertigo    several months ago   Assessment: 78 yo M to start heparin drip to empirically treat for pulmonary embolism with IV heparin, to begin 2 hours after hemostasis was achieved at the right femoral arteriotomy  with Mynx deployment.  Pt with Cardiac arrest, complete heart block, and cardiogenic shock s/p cardiac cath: showed severe native CAD with patent LIMA-LAD and SVG-OM. SVG-RCA is occluded, though this does not appear acute. Pulmonary embolism is also a consideration given active cancer.   Baseline labs: Hgb 12.8 Plt 277  INR 1.4  aPTT 37  (did receive some heparin in cath) No anticoagulation PTA per Med rec and Rx fill hx, chart search.  Goal of Therapy:  Heparin level 0.3-0.7 units/ml Monitor platelets by anticoagulation protocol: Yes  0212 0316 HL>1.10, SUPRAthera; 1600 units/hr 0212 1408 HL 1.03, SUPRAthera; 1300 units/hr   Plan:  --Hold heparin x 1 hour and then re-start heparin at 1000 units/hr --Re-check HL 8 hours from re-start --Daily CBC per protocol while on IV heparin  Tressie Ellis 04/06/2023,2:58 PM

## 2023-04-06 NOTE — Procedures (Signed)
Arterial Catheter Insertion Procedure Note  Cory Hess  [id number]  May 22, 1945  Date:04/06/23  Time:2:00 AM    Provider Performing: Dahlia Byes    Procedure: Insertion of Arterial Line (08657) with US guidance (84696)   Indication(s) Blood pressure monitoring and/or need for frequent ABGs  Consent Unable to obtain consent due to emergent nature of procedure.  Anesthesia Lidocaine transdermal   Time Out Verified patient identification, verified procedure, site/side was marked, verified correct patient position, special equipment/implants available, medications/allergies/relevant history reviewed, required imaging and test results available.   Sterile Technique Maximal sterile technique including full sterile barrier drape, hand hygiene, sterile gown, sterile gloves, mask, hair covering, sterile ultrasound probe cover (if used).   Procedure Description Area of catheter insertion was cleaned with chlorhexidine and draped in sterile fashion. With real-time ultrasound guidance an arterial catheter was placed into the right radial artery.  Appropriate arterial tracings confirmed on monitor.     Complications/Tolerance None; patient tolerated the procedure well.   EBL Minimal   Specimen(s) None

## 2023-04-06 NOTE — Progress Notes (Signed)
Advanced Heart Failure Rounding Note  Cardiologist: Yvonne Kendall, MD  Chief Complaint:  Subjective:    Currently with high pressor requirement. On Epi @ 6, levo @ 36 and vaso @ 0.04  LHC 2/11: severe native CAD with patent LIMA-LAD and SVG-OM. SVG-RCA is occluded, though this does not appear acute.   TVP in place.   Swann #s: CO 4.1 CI 1.8 CVP 10 PAPi 1.3 SVR 1284  Intubated and sedated.   Objective:   Weight Range: 104.9 kg Body mass index is 31.36 kg/m.   Vital Signs:   Temp:  [91.8 F (33.2 C)-97.7 F (36.5 C)] 97.7 F (36.5 C) (02/12 0846) Pulse Rate:  [66-114] 83 (02/12 0846) Resp:  [11-54] 16 (02/12 0846) BP: (67-160)/(42-78) 144/65 (02/12 0800) SpO2:  [70 %-100 %] 100 % (02/12 0846) Arterial Line BP: (87-133)/(50-62) 122/61 (02/12 0846) FiO2 (%):  [50 %-100 %] 50 % (02/12 0742) Weight:  [104.9 kg] 104.9 kg (02/11 1645) Last BM Date :  (PTA)  Weight change: Filed Weights   04/05/23 1645  Weight: 104.9 kg    Intake/Output:   Intake/Output Summary (Last 24 hours) at 04/06/2023 0859 Last data filed at 04/06/2023 0800 Gross per 24 hour  Intake 2448.28 ml  Output 1295 ml  Net 1153.28 ml    Physical Exam  General: intubated and sedated HEENT: +ETT, +OG Neck: supple. JVD difficult to assess. Carotids 2+ bilat; no bruits. No lymphadenopathy or thyromegaly appreciated. Swann R IJ Cor: PMI nondisplaced. Regular rate & rhythm. No rubs, gallops or murmurs. Port accessed Lungs: clear, diminished bases Abdomen: soft, nontender, nondistended. No hepatosplenomegaly. No bruits or masses. Good bowel sounds. Extremities: no cyanosis, clubbing, rash, edema. R a line GU: +foley Neuro: sedated  Telemetry   Intt paced, otherwise SR 80s (Personally reviewed)    EKG    No new EKG to review  Labs    CBC Recent Labs    04/05/23 1743 04/06/23 0316  WBC 29.0* 34.5*  NEUTROABS 25.2*  --   HGB 12.8* 13.0  HCT 37.3* 38.2*  MCV 98.4 97.7  PLT 277 291    Basic Metabolic Panel Recent Labs    09/81/19 1658 04/05/23 1703 04/05/23 1813 04/05/23 2226 04/06/23 0316 04/06/23 0603  NA  --    < >  --  142 140 139  K  --    < >  --  5.3* 7.1* 5.6*  CL  --    < >  --  105 105 106  CO2  --    < >  --  26 24 23   GLUCOSE  --    < >  --  144* 162*  --   BUN  --    < >  --  43* 47*  --   CREATININE  --    < >  --  2.57* 2.78*  --   CALCIUM  --    < >  --  10.8* 10.5*  --   MG 2.5*  --   --   --  2.1  --   PHOS  --   --  6.7*  --  4.0  --    < > = values in this interval not displayed.   Liver Function Tests Recent Labs    04/05/23 1251 04/05/23 1703  AST 83* 273*  ALT 134* 300*  ALKPHOS 99 124  BILITOT 0.8 0.9  PROT 6.8 5.9*  ALBUMIN 3.5 2.9*   No results for input(s): "  LIPASE", "AMYLASE" in the last 72 hours. Cardiac Enzymes No results for input(s): "CKTOTAL", "CKMB", "CKMBINDEX", "TROPONINI" in the last 72 hours.  BNP: BNP (last 3 results) Recent Labs    04/05/23 1743  BNP 169.6*    ProBNP (last 3 results) No results for input(s): "PROBNP" in the last 8760 hours.   D-Dimer Recent Labs    04/05/23 1658  DDIMER 6.28*   Hemoglobin A1C Recent Labs    04/05/23 1938  HGBA1C 5.9*   Fasting Lipid Panel Recent Labs    04/06/23 0316  TRIG 110   Thyroid Function Tests Recent Labs    04/05/23 1658  TSH 18.053*    Other results:   Imaging    DG Abd 1 View Result Date: 04/06/2023 CLINICAL DATA:  Confirm OG tube placement EXAM: ABDOMEN - 1 VIEW COMPARISON:  Radiograph 04/05/2023 FINDINGS: Enteric tube tip and side-port in the stomach. Unchanged position of the Swan-Ganz catheter tip in the right pulmonary artery. Defibrillator pads. Sternotomy and CABG. Cardiomegaly and pulmonary edema in the lung bases. IMPRESSION: Enteric tube tip and side-port in the stomach. Electronically Signed   By: Minerva Fester M.D.   On: 04/06/2023 03:09   Port CXR Result Date: 04/05/2023 CLINICAL DATA:  Cardiac arrest, central  line placement EXAM: PORTABLE CHEST 1 VIEW COMPARISON:  04/05/2023 FINDINGS: Two frontal views of the chest demonstrate endotracheal tube overlying tracheal air column, tip 5.4 cm above carina. Enteric catheter passes below diaphragm, tip projecting over gastric fundus. Right internal jugular flow directed central venous catheter overlies right main pulmonary artery. There is a right chest wall port via internal jugular approach, tip overlying superior vena cava. Transvenous pacer from an inferior approach tip overlies right ventricle. External defibrillator pads overlie the chest. Cardiac silhouette is stable. Vascular congestion and bilateral airspace disease again identified, unchanged since prior exam. No effusion or pneumothorax. No acute bony abnormalities. IMPRESSION: 1. Support devices as above. No complication after central venous catheter placement. 2. Cardiomegaly, with persistent findings of pulmonary edema. No significant change. Electronically Signed   By: Sharlet Salina M.D.   On: 04/05/2023 18:26   CARDIAC CATHETERIZATION Result Date: 04/05/2023 Conclusions: Severe native coronary artery disease with occluded LMCA and diffusely diseased RCA with up to 95% stenosis. Widely patent LIMA-LAD and SVG-LCx. Occluded SVG-PDA, likely chronic. Severely reduced left ventricular systolic function (LVEF 25-30%) with anterior hypokinesis/akinesis. Mildly to moderately elevated left heart filling pressures (LVEDP 20 mmHg; PCWP 25 mmHg). Moderately to severely elevated right heart filling pressures (mean RA 15 mmHg, RV 40/15 mmHg).  PAPi of 1 suggests an element of RV dysfunction. Successful placement of temporary transvenous pacemaker via the right femoral vein. Recommendations: No obvious culprit lesion identified for the patient's cardiac arrest, marked bradycardia/complete heart block, cardiogenic shock, and acute on chronic HFrEF.  Continue secondary prevention of CAD. Significant hypoxia noted throughout  catheterization despite aggressive oxygenation with the ventilator.  Question if some other process such as pulmonary embolism or intrinsic lung disease could be contributing.  Recommend empiric heparin to be given 2 hours after right femoral artery hemostasis was achieved.  Further workup per critical care team. Obtain stat portable chest x-ray when patient arrives in the ICU to confirm location of temporary transvenous pacing wire and pulmonary artery catheter. Continue gentle diuresis as blood pressure allows. Wean epinephrine infusion. Yvonne Kendall, MD [facility]  DG Chest Portable 1 View Result Date: 04/05/2023 CLINICAL DATA:  Post intubation EXAM: PORTABLE CHEST 1 VIEW COMPARISON:  11/11/2018 FINDINGS: Right-sided  central venous port tip at the SVC. Post sternotomy changes. Endotracheal tube tip is about 5 cm superior to the carina. Cardiomegaly. No significant effusion. Asymmetrical diffuse airspace disease in the right thorax compared to the left. Some vascular congestion is noted. IMPRESSION: 1. Endotracheal tube tip about 5 cm superior to the carina. 2. Cardiomegaly with vascular congestion. Asymmetrical diffuse airspace disease in the right thorax compared to the left, question asymmetric edema or pneumonia. Electronically Signed   By: Jasmine Pang M.D.   On: 04/05/2023 15:34     Medications:     Scheduled Medications:  aspirin EC  81 mg Oral Daily   atorvastatin  80 mg Oral Daily   Chlorhexidine Gluconate Cloth  6 each Topical Daily   docusate  100 mg Per Tube BID   hydrocortisone sod succinate (SOLU-CORTEF) inj  100 mg Intravenous Q12H   insulin aspart  0-6 Units Subcutaneous Q4H   mouth rinse  15 mL Mouth Rinse Q2H   polyethylene glycol  17 g Per Tube Daily   sodium chloride flush  10-40 mL Intracatheter Q12H   sodium chloride flush  3 mL Intravenous Q12H   sodium zirconium cyclosilicate  10 g Per Tube TID    Infusions:  sodium chloride Stopped (04/05/23 1726)   sodium  chloride     sodium chloride     sodium chloride 10 mL/hr at 04/06/23 0800   sodium chloride 10 mL/hr at 04/06/23 0800   epinephrine 6 mcg/min (04/06/23 0800)   famotidine (PEPCID) IV Stopped (04/05/23 1926)   fentaNYL infusion INTRAVENOUS 150 mcg/hr (04/06/23 0800)   heparin 1,300 Units/hr (04/06/23 0800)   midazolam 2 mg/hr (04/06/23 0800)   norepinephrine (LEVOPHED) Adult infusion 36 mcg/min (04/06/23 0800)   phenylephrine (NEO-SYNEPHRINE) Adult infusion Stopped (04/06/23 0547)   propofol (DIPRIVAN) infusion 35 mcg/kg/min (04/06/23 0800)   vasopressin 0.04 Units/min (04/06/23 0800)    PRN Medications: sodium chloride, sodium chloride, acetaminophen, fentaNYL, ipratropium-albuterol, midazolam, ondansetron (ZOFRAN) IV, mouth rinse, sodium chloride flush, sodium chloride flush    Patient Profile  Cory Hess is a 78 y.o. 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 AHF team to see post cardiac arrest.   Assessment/Plan  Cardiac arrest / CHB - witnessed cardiac arrest 2/11 while in [place] urgent care waiting room>transferred to [facility] - CPR started immediately by bystander with EMD arrival within 30 seconds - ROSC achieved numerous times but he would keep losing his pulse. Pulse checks showed PEA, asystole and CHB.  - ROSC achieved, now with TVP - LHC 2/11: severe native CAD with patent LIMA-LAD and SVG-OM. SVG-RCA is occluded. Does not appear acute - Currently with high pressor requirement. On Epi @ 6, levo @ 36 and vaso @ 0.04. Swann #s as above. Titrate levo for MAP >65. MAP ~80 while in room.  - HsTrop 21   Shock - Lactic acid >9 on admission - LA >9>5.5>2.9>3.3>3 - suspect metabolic/septic etiology - Blood cultures / UA / UC / resp panel - covid (-)   HFimpEF, iCM - 09/2022 Echo: EF 55-60%, mod LVH, GrI DD, nl RV fxn, mildly dil RA, mild  MR. - see full echo history above in HPI. (EF 35% pre CABG) - EF ~40% on POCUS post arrest - NYHA IV on admission - CVP 10. Start 40 IV lasix BID - will wait to add GDMT  when off pressor support - Update echo   CAD - Hx nSTEMI 18'  - Now s/p CABG x3 61' - LHC 2/11: severe native CAD with patent LIMA-LAD and SVG-OM. SVG-RCA is occluded. Does not appear acute - Continue ASA/ statin   Alternating BBB - hx of IVCD.  - RBBB on EKG on admission - Now with TVP   HTN - currently on pressor support  7. HLD - LDL 45 2/23 - update lipid panel - Continue statin  8. Acute respiratory failure - hypoxic on admission - need to r/o PE. Holding off on CTA-PE with elevated SCr - intubated. Management per PCCM - covid (-).  - Started on heparin gtt for possible PE  9. AKI - SCr 2.32 on admission - Baseline ~1 - Today 2.78 - avoid hypotension  10 Hyperkalemia - Up to 7.1 post cath yesterday - 5.6 today. S/p insulin/D50/CaGluc/Lokelma - BMET later today   Length of Stay: 1  Alen Bleacher, NP  04/06/2023, 8:59 AM  Advanced Heart Failure Team Pager [phone] (M-F; 7a - 5p)  Please contact [facility] for night-coverage after hours (5p -7a ) and weekends on [email or web address]

## 2023-04-06 NOTE — Plan of Care (Signed)
Problem: Clinical Measurements: Goal: Ability to maintain clinical measurements within normal limits will improve Outcome: Not Progressing Goal: Will remain free from infection Outcome: Not Progressing Goal: Diagnostic test results will improve Outcome: Not Progressing Goal: Respiratory complications will improve Outcome: Not Progressing Goal: Cardiovascular complication will be avoided Outcome: Not Progressing

## 2023-04-06 NOTE — Progress Notes (Signed)
PHARMACY - ANTICOAGULATION CONSULT NOTE  Pharmacy Consult for Heparin drip Indication: empirically treat for pulmonary embolism with IV heparin, to begin 2 hours after hemostasis was achieved at the right femoral arteriotomy with Mynx deployment. - per Cardiology note 04/05/23  No Known Allergies  Patient Measurements: Height: 6' (182.9 cm) Weight: 104.9 kg (231 lb 4.2 oz) IBW/kg (Calculated) : 77.6 Heparin Dosing Weight: 99 kg  Vital Signs: Temp: 97.5 F (36.4 C) (02/12 0400) Temp Source: Core (02/11 1715) BP: 109/60 (02/12 0400) Pulse Rate: 69 (02/12 0400)  Labs: Recent Labs    04/05/23 1251 04/05/23 1423 04/05/23 1459 04/05/23 1658 04/05/23 1703 04/05/23 1743 04/05/23 2226 04/06/23 0316  HGB 12.5*   < > 10.9*  --   --  12.8*  --  13.0  HCT 40.8   < > 32.0*  --   --  37.3*  --  38.2*  PLT 306  --   --   --   --  277  --  291  APTT  --   --   --   --  37*  --   --   --   LABPROT  --   --   --   --  17.4*  --   --   --   INR  --   --   --   --  1.4*  --   --   --   HEPARINUNFRC  --   --   --   --   --   --   --  >1.10*  CREATININE 2.32*  --   --   --  2.33*  --  2.57* 2.78*  TROPONINIHS 21*  --   --  898*  --   --   --   --    < > = values in this interval not displayed.    Estimated Creatinine Clearance: 28.3 mL/min (A) (by C-G formula based on SCr of 2.78 mg/dL (H)).   Medical History: Past Medical History:  Diagnosis Date   Alternating RBBB & LBBB    Ankle swelling 2024   Arthritis    neck   Bladder tumor    CAD (coronary artery disease)    a. 08/2017 STEMI/Cath: LM 80ost/d, LAD 100p, LCX 95 ost/p, RCA 60p, 41m, RPDA 80ost; b. 08/2017 CABG x 3: LIMA->LAD, VG->OM, VG->PDA.   Cancer [facility])    bladder tumor   Cancer of base of tongue (HCC)    Essential hypertension    HFimpEF (heart failure with improved ejection fraction) (HCC)    a. 08/2017 TEE: EF 30-35%, sept/ant/inf HK, apical AK. 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 12/2017 Echo: EF 45-50%, diff HK. Gr1 DD. Mildly  reduced RV fxn. Mild RAE; c. 09/2022 Echo: EF 55-60%, mod LVH, GrI DD, nl RV fxn, mildly dil RA, mild MR.   Hyperlipidemia LDL goal <70    Ischemic cardiomyopathy    a. 08/2017 TEE: EF 30-35%; b. 12/2017 Echo: EF 45-50%; c. 09/2022 Echo: EF 55-60%.   Myocardial infarction (HCC) 08/27/2017   Shingles    2018 - right side   Vertigo    several months ago    Medications:  Medications Prior to Admission  Medication Sig Dispense Refill Last Dose/Taking   acetaminophen (TYLENOL) 650 MG CR tablet Take 650 mg by mouth every 8 (eight) hours as needed for pain.      aspirin EC 81 MG EC tablet Take 1 tablet (81 mg total) by mouth daily.  atorvastatin (LIPITOR) 80 MG tablet Take 1 tablet by mouth once daily 90 tablet 0    carvedilol (COREG) 12.5 MG tablet TAKE 1 TABLET BY MOUTH TWICE DAILY WITH A MEAL 180 tablet 0    lidocaine-prilocaine (EMLA) cream Apply on the port. 30 -45 min  prior to port access. 30 g 3    losartan (COZAAR) 100 MG tablet Take 1 tablet by mouth once daily 90 tablet 0    magic mouthwash (multi-ingredient) oral suspension Swish and spit 5-10 mLs 4 (four) times daily as needed. 480 mL 1    Multiple Vitamin (MULTIVITAMIN WITH MINERALS) TABS tablet Take 1 tablet by mouth daily. One a Day over 50/ lunch      nitroGLYCERIN (NITROSTAT) 0.4 MG SL tablet DISSOLVE ONE TABLET UNDER THE TONGUE EVERY 5 MINUTES AS NEEDED FOR CHEST PAIN.  DO NOT EXCEED A TOTAL OF 3 DOSES IN 15 MINUTES 25 tablet 0    OLANZapine (ZYPREXA) 5 MG tablet Take one pill at night. 30 tablet 0    ondansetron (ZOFRAN) 8 MG tablet One pill every 8 hours as needed for nausea/vomitting. 40 tablet 1    polyethylene glycol (MIRALAX) 17 g packet Take 17 g by mouth daily. 30 each 0    prochlorperazine (COMPAZINE) 10 MG tablet Take 1 tablet (10 mg total) by mouth every 6 (six) hours as needed for nausea or vomiting. 40 tablet 1    senna-docusate (SENOKOT-S) 8.6-50 MG tablet Take 1 tablet by mouth daily. 30 tablet 0    sucralfate  (CARAFATE) 1 g tablet Take 1 tablet (1 g total) by mouth 4 (four) times daily -  with meals and at bedtime. Dissolve in 4 tablespoons warm water 30 minutes before meals. 90 tablet 1    sulfamethoxazole-trimethoprim (BACTRIM DS) 800-160 MG tablet Take 1 tablet by mouth 2 (two) times daily for 10 days. 20 tablet 0    torsemide (DEMADEX) 20 MG tablet Take 1 tablet (20 mg total) by mouth daily as needed (swelling and weight gain). 30 tablet 3    traMADol HCl 100 MG TABS Take 100 mg by mouth every 8 (eight) hours as needed. 30 tablet 0    Scheduled:   aspirin EC  81 mg Oral Daily   atorvastatin  80 mg Oral Daily   Chlorhexidine Gluconate Cloth  6 each Topical Daily   insulin aspart  10 Units Intravenous Once   And   dextrose  1 ampule Intravenous Once   docusate  100 mg Per Tube BID   fentaNYL (SUBLIMAZE) injection  25 mcg Intravenous Once   insulin aspart  0-6 Units Subcutaneous Q4H   mouth rinse  15 mL Mouth Rinse Q2H   polyethylene glycol  17 g Per Tube Daily   sodium chloride flush  10-40 mL Intracatheter Q12H   sodium chloride flush  3 mL Intravenous Q12H   sodium zirconium cyclosilicate  10 g Per Tube TID   Infusions:   sodium chloride Stopped (04/05/23 1726)   sodium chloride     sodium chloride     sodium chloride 10 mL/hr at 04/06/23 0400   sodium chloride 10 mL/hr at 04/06/23 0400   calcium gluconate     cefTRIAXone (ROCEPHIN)  IV Stopped (04/05/23 1957)   epinephrine 6 mcg/min (04/06/23 0405)   famotidine (PEPCID) IV Stopped (04/05/23 1926)   fentaNYL infusion INTRAVENOUS 150 mcg/hr (04/06/23 0400)   heparin     midazolam 2 mg/hr (04/06/23 0400)   norepinephrine (LEVOPHED) Adult infusion 40  mcg/min (04/06/23 0400)   phenylephrine (NEO-SYNEPHRINE) Adult infusion     propofol (DIPRIVAN) infusion 30 mcg/kg/min (04/06/23 0400)   vasopressin 0.04 Units/min (04/06/23 0438)    Assessment: 78 yo M to start heparin drip to empirically treat for pulmonary embolism with IV  heparin, to begin 2 hours after hemostasis was achieved at the right femoral arteriotomy with Mynx deployment.  Pt with Cardiac arrest, complete heart block, and cardiogenic shock s/p cardiac cath: showed severe native CAD with patent LIMA-LAD and SVG-OM. SVG-RCA is occluded, though this does not appear acute.  Pulmonary embolism is also a consideration given active cancer.  Baseline labs: Hgb 12.8 Plt 277  INR 1.4  aPTT 37  (did receive some heparin in cath) No anticoagulation PTA per Med rec and Rx fill hx, chart search.    Goal of Therapy:  Heparin level 0.3-0.7 units/ml Monitor platelets by anticoagulation protocol: Yes  02/12@0316 : HL>1.10, SUPRAtherapeutic@1600  units/hr   Plan:  Verified with nurse that level was drawn correctly Will hold heparin infusion for approximately 1 hour Once restarted, will decrease infusion rate to 1300 units/hr Check anti-Xa level in 8 hours after restart Continue to monitor H&H and platelets  Shakeeta Godette A Ashleah Valtierra 04/06/2023,4:57 AM

## 2023-04-06 NOTE — Consult Note (Cosign Needed Addendum)
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  He presented to [facility] ER via EMS from [facility] following a witnessed cardiac arrest.  Bystander CPR initiated and EMS arrived on the scene within 30 seconds cardiac rhythm asystole.  ROSC initially achieved, however pt in and out of PEA/asystole.     Pt recently evaluated in the ER on 02/4 with urinary urgency and frequency that started 5 days prior to that presentation.  Diagnosed with a UTI and prescribed Bactrim DS twice daily for 10 days.  However, symptoms persisted prompting [facility] visit today.    Clinical Assessment and Goals of Care: Notes and labs reviewed.  Into see patient.  He is currently resting in bed on ventilator with no family at bedside.   Called to speak with patient's sister.  She states that at this time she is not at the hospital and they are working to pick up patient's vehicle from the urgent care that it was left at.  Sister received a phone call and our call was disconnected.      SUMMARY OF RECOMMENDATIONS   Continue current care. PMT will reattempt tomorrow.  Prognosis:  Poor overall       Primary Diagnoses: Present on Admission:  Acute kidney injury (HCC)   I have reviewed the medical record, interviewed the patient and family, and examined the patient. The following aspects are pertinent.  Past Medical History:  Diagnosis  Date   Alternating RBBB & LBBB    Ankle swelling 2024   Arthritis    neck   Bladder tumor    CAD (coronary artery disease)    a. 08/2017 STEMI/Cath: LM 80ost/d, LAD 100p, LCX 95 ost/p, RCA 60p, 62m, RPDA 80ost; b. 08/2017 CABG x 3: LIMA->LAD, VG->OM, VG->PDA.   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 08/2017 TEE: EF 30-35%, sept/ant/inf HK, apical AK. 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 12/2017 Echo: EF 45-50%, diff HK. Gr1 DD. Mildly reduced RV fxn. Mild RAE; c. 09/2022 Echo: EF 55-60%, mod LVH, GrI DD, nl RV fxn, mildly dil RA, mild MR.   Hyperlipidemia LDL goal <70    Ischemic cardiomyopathy    a. 08/2017 TEE: EF 30-35%; b. 12/2017 Echo: EF 45-50%; c. 09/2022 Echo: EF 55-60%.   Myocardial infarction (HCC) 08/27/2017   Shingles    2018 - right side   Vertigo    several  months ago   Social History   Socioeconomic History   Marital status: Single    Spouse name: Not on file   Number of children: Not on file   Years of education: Not on file   Highest education level: Not on file  Occupational History   Occupation: supervision, Research scientist (life sciences)    Comment: retired  Tobacco Use   Smoking status: Former    Current packs/day: 0.00    Types: Cigarettes    Quit date: 2000    Years since quitting: 25.1   Smokeless tobacco: Current    Types: Snuff  Vaping Use   Vaping status: Never Used  Substance and Sexual Activity   Alcohol use: Not Currently   Drug use: Not Currently   Sexual activity: Not on file  Other Topics Concern   Not on file  Social History Narrative   Lives alone   Social Drivers of Health   Financial Resource Strain: Low Risk  (11/06/2018)   Overall Financial Resource Strain (CARDIA)    Difficulty of Paying Living Expenses: Not hard at all  Food Insecurity: Patient Unable To Answer (04/06/2023)   Hunger Vital Sign    Worried About Running  Out of Food in the Last Year: Patient unable to answer    Ran Out of Food in the Last Year: Patient unable to answer  Transportation Needs: Patient Unable To Answer (04/06/2023)   PRAPARE - Transportation    Lack of Transportation (Medical): Patient unable to answer    Lack of Transportation (Non-Medical): Patient unable to answer  Physical Activity: Inactive (11/06/2018)   Exercise Vital Sign    Days of Exercise per Week: 0 days    Minutes of Exercise per Session: 0 min  Stress: No Stress Concern Present (11/06/2018)   [employer] of Occupational Health - Occupational Stress Questionnaire    Feeling of Stress : Not at all  Social Connections: Patient Unable To Answer (04/06/2023)   Social Connection and Isolation Panel [NHANES]    Frequency of Communication with Friends and Family: Patient unable to answer    Frequency of Social Gatherings with Friends and Family: Patient unable to answer    Attends Religious Services: Patient unable to answer    Active Member of Clubs or Organizations: Patient unable to answer    Attends Banker Meetings: Patient unable to answer    Marital Status: Patient unable to answer   Family History  Problem Relation Age of Onset   Heart failure Mother    Lung disease Father    Stroke Father    Cervical cancer Maternal Grandmother    Scheduled Meds:  [START ON 04/07/2023] aspirin  81 mg Per Tube Daily   [START ON 04/07/2023] atorvastatin  80 mg Per Tube Daily   Chlorhexidine Gluconate Cloth  6 each Topical Daily   docusate  100 mg Per Tube BID   hydrocortisone sod succinate (SOLU-CORTEF) inj  100 mg Intravenous Q12H   insulin aspart  0-6 Units Subcutaneous Q4H   mouth rinse  15 mL Mouth Rinse Q2H   polyethylene glycol  17 g Per Tube Daily   sodium chloride flush  10-40 mL Intracatheter Q12H   sodium chloride flush  3 mL Intravenous Q12H   Continuous Infusions:  sodium chloride Stopped (04/05/23 1726)   sodium chloride     sodium  chloride     sodium chloride 10 mL/hr at 04/06/23 1200   sodium chloride Stopped (04/06/23 1053)   epinephrine 6  mcg/min (04/06/23 1200)   famotidine (PEPCID) IV Stopped (04/05/23 1926)   fentaNYL infusion INTRAVENOUS 150 mcg/hr (04/06/23 1200)   heparin     norepinephrine (LEVOPHED) Adult infusion 23 mcg/min (04/06/23 1200)   piperacillin-tazobactam (ZOSYN)  IV 12.5 mL/hr at 04/06/23 1200   propofol (DIPRIVAN) infusion 35 mcg/kg/min (04/06/23 1423)   vasopressin 0.04 Units/min (04/06/23 1415)   PRN Meds:.sodium chloride, sodium chloride, acetaminophen, fentaNYL, ipratropium-albuterol, midazolam, ondansetron (ZOFRAN) IV, mouth rinse, sodium chloride flush, sodium chloride flush Medications Prior to Admission:  Prior to Admission medications   Medication Sig Start Date End Date Taking? Authorizing Provider  acetaminophen (TYLENOL) 650 MG CR tablet Take 650 mg by mouth every 8 (eight) hours as needed for pain.    [provider]  aspirin EC 81 MG EC tablet Take 1 tablet (81 mg total) by mouth daily. 09/03/17   Gold, Glenice Laine, PA-C  atorvastatin (LIPITOR) 80 MG tablet Take 1 tablet by mouth once daily 02/08/23   Creig Hines, NP  carvedilol (COREG) 12.5 MG tablet TAKE 1 TABLET BY MOUTH TWICE DAILY WITH A MEAL 03/10/23   End, Cristal Deer, MD  lidocaine-prilocaine (EMLA) cream Apply on the port. 30 -45 min  prior to port access. 10/08/22   Earna Coder, MD  losartan (COZAAR) 100 MG tablet Take 1 tablet by mouth once daily 03/10/23   End, Cristal Deer, MD  magic mouthwash (multi-ingredient) oral suspension Swish and spit 5-10 mLs 4 (four) times daily as needed. 11/24/22   Earna Coder, MD  Multiple Vitamin (MULTIVITAMIN WITH MINERALS) TABS tablet Take 1 tablet by mouth daily. One a Day over 50/ lunch    [provider]  nitroGLYCERIN (NITROSTAT) 0.4 MG SL tablet DISSOLVE ONE TABLET UNDER THE TONGUE EVERY 5 MINUTES AS NEEDED FOR CHEST PAIN.  DO NOT EXCEED A  TOTAL OF 3 DOSES IN 15 MINUTES 03/24/23   Creig Hines, NP  OLANZapine (ZYPREXA) 5 MG tablet Take one pill at night. 11/03/22   Earna Coder, MD  ondansetron (ZOFRAN) 8 MG tablet One pill every 8 hours as needed for nausea/vomitting. 10/08/22   Earna Coder, MD  polyethylene glycol (MIRALAX) 17 g packet Take 17 g by mouth daily. 11/07/22   Romeo Apple, Myah A, PA-C  prochlorperazine (COMPAZINE) 10 MG tablet Take 1 tablet (10 mg total) by mouth every 6 (six) hours as needed for nausea or vomiting. 10/08/22   Earna Coder, MD  senna-docusate (SENOKOT-S) 8.6-50 MG tablet Take 1 tablet by mouth daily. 11/07/22   Romeo Apple, Myah A, PA-C  sucralfate (CARAFATE) 1 g tablet Take 1 tablet (1 g total) by mouth 4 (four) times daily -  with meals and at bedtime. Dissolve in 4 tablespoons warm water 30 minutes before meals. 10/26/22   Carmina Miller, MD  sulfamethoxazole-trimethoprim (BACTRIM DS) 800-160 MG tablet Take 1 tablet by mouth 2 (two) times daily for 10 days. 03/29/23 04/08/23  Becky Augusta, NP  torsemide (DEMADEX) 20 MG tablet Take 1 tablet (20 mg total) by mouth daily as needed (swelling and weight gain). 11/25/22 02/23/23  Creig Hines, NP  traMADol HCl 100 MG TABS Take 100 mg by mouth every 8 (eight) hours as needed. 10/26/22   Earna Coder, MD   No Known Allergies Review of Systems  Unable to perform ROS   Physical Exam Constitutional:      Comments: Eyes closed.   Pulmonary:     Comments: On ventilator.     Vital Signs: BP 128/60  Pulse 73   Temp (!) 96.6 F (35.9 C)   Resp (!) 23   Ht 6' (1.829 m)   Wt 104.9 kg   SpO2 100%   BMI 31.36 kg/m  Pain Scale: CPOT POSS *See Group Information*: 1-Acceptable,Awake and alert Pain Score: 0-No pain   SpO2: SpO2: 100 % O2 Device:SpO2: 100 % O2 Flow Rate: .   IO: Intake/output summary:  Intake/Output Summary (Last 24 hours) at 04/06/2023 1538 Last data filed at 04/06/2023 1200 Gross per 24  hour  Intake 3290.89 ml  Output 895 ml  Net 2395.89 ml    LBM: Last BM Date :  (PTA) Baseline Weight: Weight: 104.9 kg Most recent weight: Weight: 104.9 kg       Signed by: Morton Stall, NP   Please contact Palliative Medicine Team phone at [phone] for questions and concerns.  For individual provider: See Loretha Stapler

## 2023-04-06 NOTE — Progress Notes (Signed)
Initial Nutrition Assessment  DOCUMENTATION CODES:   Obesity unspecified  INTERVENTION:   Once stable for tube feeds:  Vital HP @60ml /hr- Initiate at 75ml/hr and advance by 57ml/hr q 8 hours until goal rate is reached.   ProSource TF 20- Give 60ml BID via tube, each supplement provides 80kcal and 20g of protein.   Free water flushes 30ml q4 hours to maintain tube patency   Regimen provides 1600kcal/day, 166g/day protein and 1317ml/day of free water.   Pt at high refeed risk; recommend monitor potassium, magnesium and phosphorus labs daily until stable  Daily weights   NUTRITION DIAGNOSIS:   Inadequate oral intake related to inability to eat (pt sedated and ventilated) as evidenced by NPO status.  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  Pt sedated and ventilated. OGT in place. Pt is not stable for nutrition support today. Will re-evaluate tomorrow. Pt is likely at refeed risk. Per chart, pt appears fairly weight stable at baseline.   Medications reviewed and include: aspirin, colace, solu-cortef, insulin, miralax, epinephrine, pepcid, heparin, levophed, zosyn, propofol, vasopressin   Labs reviewed: K 5.3(H), BUN 48(H), creat 2.63(H) P 4.0  Patient is currently intubated on ventilator support MV: 14.2 L/min Temp (24hrs), Avg:96.4 F (35.8 C), Min:91.8 F (33.2 C), Max:97.7 F (36.5 C)  Propofol: 39ml/hr- provides 580kcal/day   MAP >75mmHg   UOP-   NUTRITION -  FOCUSED PHYSICAL EXAM:  Flowsheet Row Most Recent Value  Orbital Region No depletion  Upper Arm Region No depletion  Thoracic and Lumbar Region No depletion  Buccal Region No depletion  Temple Region Moderate depletion  Clavicle Bone Region No depletion  Clavicle and Acromion Bone Region No depletion  Scapular Bone Region No depletion  Dorsal Hand No depletion  Patellar Region No depletion  Anterior Thigh Region No depletion  Posterior Calf Region No depletion  Edema (RD Assessment) None  Hair Reviewed  Eyes Reviewed  Mouth Reviewed  Skin Reviewed  Nails Reviewed   Diet Order:   Diet Order             Diet NPO time specified  Diet effective now                  EDUCATION NEEDS:   No education needs have been identified at this time  Skin:  Skin Assessment: Reviewed RN Assessment (ecchymosis)  Last BM:  PTA  Height:   Ht Readings from Last 1 Encounters:  04/05/23 6' (1.829 m)    Weight:   Wt Readings from Last 1 Encounters:  04/05/23 104.9 kg    Ideal Body Weight:  80.9 kg  BMI:  Body mass index is 31.36 kg/m.  Estimated Nutritional Needs:   Kcal:  1154-1468kcal/day  Protein:  >160g/day  Fluid:  2.1-2.4L/day  Betsey Holiday MS, RD, LDN If unable to be reached, please send secure chat to "RD inpatient" available from 8:00a-4:00p daily

## 2023-04-06 NOTE — Progress Notes (Signed)
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  He presented to [facility] ER via EMS from [facility] following a witnessed cardiac arrest.  Bystander CPR initiated and EMS arrived on the scene within 30 seconds cardiac rhythm asystole.  ROSC initially achieved, however pt in and out of PEA/asystole.    Pt recently evaluated in the ER on 02/4 with urinary urgency and frequency that started 5 days prior to that presentation.  Diagnosed with a UTI and prescribed Bactrim DS twice daily for 10 days.  However, symptoms persisted prompting [facility] visit today.  ED Course  Upon arrival to the ER pt on NRB with pulse.  It was reported during chest compressions the pt began to fight and pulling at medical devices.  While in the the ER waiting room the pt cardiac arrested again requiring ACLS protocol and mechanical intubation.  Pt with varied cardiac rhythm during cardiac arrest PEA/asystole/complete heart block.  He required transcutaneously pacing.  EKG showed apparent sinus rhythm with complete heart block and ventricular escape rhythm with LBBB morphology.  Total downtime estimated 2hrs from out of hospital cardiac arrest and inpatient cardiac arrest.  Pt required epinephrine gtt due to bradycardia and hypotension.  Pt taken emergently to the cardiac cath lab for temporary transvenous pacemaker placement, emergent cardiac catheterization, and swan-ganz catheter placement.   Cardiac cath revealed severe native CAD with patent LIMA-LAD and SVG-OM. SVG-RCA is occluded, but did not appear  acute and unlikely the inciting factor for today's cardiac arrest.  Per cardiology if pt makes a meaningful recovery he may require revascularization of the RCA.  Pt admitted to the ICU post cardiac cath PCCM consulted to asume care.    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  Worsening AKI and mild hyperkalemia, given shifting measures.  Low threshold for Nephrology consultation.  Interim History / Subjective:  As outlined above under Significant Hospital Events section  Objective   Blood pressure (!) 144/65, pulse 83, temperature (!) 91.8 F (33.2 C), resp. rate (!) 21, height 6' (1.829 m), weight 104.9 kg, SpO2 100%. PAP: (29-45)/(20-29) 29/22 CVP:  [7 mmHg-20 mmHg] 9 mmHg CO:  [2.6 L/min] 2.6 L/min  Vent Mode: PRVC FiO2 (%):  [50 %-100 %] 50 % Set Rate:  [15 bmp-26 bmp] 26 bmp Vt Set:  [450 mL] 450 mL PEEP:  [10 cmH20-14 cmH20] 12 cmH20 Plateau Pressure:  [12 cmH20-33 cmH20] 12 cmH20   Intake/Output Summary (Last 24 hours) at 04/06/2023 0823 Last data filed at 04/06/2023 0800 Gross per 24 hour  Intake 2448.28 ml  Output 1295 ml  Net  1153.28 ml   Filed Weights   04/05/23 1645  Weight: 104.9 kg    Examination: General: Acute on chronically-ill appearing male, NAD mechanically intubated  HENT: Supple, mild JVD  Lungs: Faint rhonchi throughout, even, non labored, overbreathing the vent Cardiovascular: Paced  rhythm, no m/r/g, 2+ radial/1+ distal pulses, bilateral extremities cool to touch  Abdomen: +BS x4, obese, soft non distended  Extremities: Normal bulk and tone, no deformities, mild mottling Neuro: Sedated, currently not following withdrawing from pain or following commands, PERRL  GU: Indwelling foley catheter draining yellow urine   Resolved Hospital Problem list     Assessment & Plan:   #Acute metabolic encephalopathy  #Mechanical intubation pain/discomfort  -Maintain a RASS goal of 0 to -1 -Fentanyl and Propofol as needed to maintain RASS goal -Avoid sedating medications as able -Daily wake up assessment  #Cardiac arrest  #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 SVG-RCA is occluded, but did not appear acute and unlikely the inciting factor for today's cardiac arrest -Continuous cardiac monitoring -Maintain MAP >65 -Vasopressors as needed to maintain MAP goal -Trend lactic acid until normalized -Trend HS Troponin until peaked -Trend Coox panel -Echocardiogram pending -Diuresis as BP and renal function permits ~ holding due to severe shock -Cardiology following, appreciate input: recommending if pt has a meaningful recovery may require revascularization of the RCA and  if junctional rhythm persist start amiodarone gtt -Heparin gtt per cardiology recommendations -Advanced heart failure team following along: left filling pressures mildly elevated; right heart filling pressures moderately elevated will continue pulmonary artery catheter for continuous hemodynamic monitoring -GDMT on hold for now  -Thyroid panel pending   #Acute hypoxic hypercapnic respiratory failure  #Possible PE #Aspiration pneumonia secondary to acute encephalopathy post cardiac arrest  #Mechanical intubation  -Full vent  support, implement lung protective strategies -Plateau pressures less than 30 cm H20 -Wean FiO2 & PEEP as tolerated to maintain O2 sats >92% -Follow intermittent Chest X-ray & ABG as needed -Spontaneous Breathing Trials when respiratory parameters met and mental status permits -Implement VAP Bundle -Prn Bronchodilators -ABX as above -Heparin gtt: dosing per pharmacy  -Unable to perform CTA PE Chest due to acute kidney injury consider lung VQ scan once stabilized to travel   #Acute kidney injury secondary to ATN  #Hyperkalemia  #Anion gap metabolic acidosis  #Lactic Acidosis -Monitor I&O's / urinary output -Follow BMP -Ensure adequate renal perfusion -Avoid nephrotoxic agents as able -Replace electrolytes as indicated ~ Pharmacy following for assistance with electrolyte replacement -Continue shifting measures: Insulin and Lokelma ~ Repeat BMP later this afternoon -Low threshold for Nephrology consultation  #UTI  #Aspiration pneumonia  -Monitor fever curve -Trend WBC's & Procalcitonin -Follow cultures as above -Broaden ABX to empiric Vancomycin and Zoysn pending cultures & sensitivities given critical illness  #Transaminitis  - Trend hepatic function panel  - Avoid hepatotoxic medications as able   #Hyperglycemia  - Hemoglobin A1c pending  - CBG's q4hrs  - Very sensitive SSI  - Follow hyper/hypoglycemic protocol  - Target CBG readings 140 to 180    Best Practice (right click and "Reselect all SmartList Selections" daily)   Diet/type: NPO DVT prophylaxis systemic heparin Pressure ulcer(s): N/A GI prophylaxis: H2B Lines: Swan-ganz catheter and still needed; portacath present on admission  Foley:  Yes, and it is still needed Code Status:  full code Last date of  multidisciplinary goals of care discussion [2/12]  2/12: Will update pt's family when they arrive at bedside  Labs   CBC: Recent Labs  Lab 04/05/23 1251 04/05/23 1423 04/05/23 1458 04/05/23 1459  04/05/23 1743 04/06/23 0316  WBC 13.7*  --   --   --  29.0* 34.5*  NEUTROABS  --   --   --   --  25.2*  --   HGB 12.5* 11.2* 10.9* 10.9* 12.8* 13.0  HCT 40.8 33.0* 32.0* 32.0* 37.3* 38.2*  MCV 108.8*  --   --   --  98.4 97.7  PLT 306  --   --   --  277 291    Basic Metabolic Panel: Recent Labs  Lab 04/05/23 1251 04/05/23 1423 04/05/23 1459 04/05/23 1658 04/05/23 1703 04/05/23 1813 04/05/23 2226 04/06/23 0316 04/06/23 0603  NA 139   < > 141  --  142  --  142 140 139  K 5.1   < > 6.3*  --  5.7*  --  5.3* 7.1* 5.6*  CL 101  --   --   --  106  --  105 105 106  CO2 16*  --   --   --  27  --  26 24 23   GLUCOSE 227*  --   --   --  155*  --  144* 162*  --   BUN 36*  --   --   --  37*  --  43* 47*  --   CREATININE 2.32*  --   --   --  2.33*  --  2.57* 2.78*  --   CALCIUM 9.2  --   --   --  11.2*  --  10.8* 10.5*  --   MG  --   --   --  2.5*  --   --   --  2.1  --   PHOS  --   --   --   --   --  6.7*  --  4.0  --    < > = values in this interval not displayed.   GFR: Estimated Creatinine Clearance: 28.3 mL/min (A) (by C-G formula based on SCr of 2.78 mg/dL (H)). Recent Labs  Lab 04/05/23 1251 04/05/23 1428 04/05/23 1658 04/05/23 1743 04/05/23 2035 04/06/23 0316  PROCALCITON  --   --  0.28  --   --  7.41  WBC 13.7*  --   --  29.0*  --  34.5*  LATICACIDVEN >9.0*   < > 2.9* 2.9* 3.3* 3.0*   < > = values in this interval not displayed.    Liver Function Tests: Recent Labs  Lab 04/05/23 1251 04/05/23 1703  AST 83* 273*  ALT 134* 300*  ALKPHOS 99 124  BILITOT 0.8 0.9  PROT 6.8 5.9*  ALBUMIN 3.5 2.9*   No results for input(s): "LIPASE", "AMYLASE" in the last 168 hours. No results for input(s): "AMMONIA" in the last 168 hours.  ABG    Component Value Date/Time   PHART 7.37 04/06/2023 0230   PCO2ART 46 04/06/2023 0230   PO2ART 90 04/06/2023 0230   HCO3 26.6 04/06/2023 0230   TCO2 33 (H) 04/05/2023 1459   ACIDBASEDEF 1.0 04/05/2023 1423   O2SAT 84.5 04/06/2023  0741     Coagulation Profile: Recent Labs  Lab 04/05/23 1703  INR 1.4*    Cardiac Enzymes: No results for input(s): "CKTOTAL", "CKMB", "CKMBINDEX", "TROPONINI" in the last 168 hours.  HbA1C: Hgb A1c MFr  Bld  Date/Time Value Ref Range Status  04/05/2023 07:38 PM 5.9 (H) 4.8 - 5.6 % Final    Comment:    (NOTE) Pre diabetes:          5.7%-6.4%  Diabetes:              >6.4%  Glycemic control for   <7.0% adults with diabetes   08/28/2017 02:27 AM 5.6 4.8 - 5.6 % Final    Comment:    (NOTE) Pre diabetes:          5.7%-6.4% Diabetes:              >6.4% Glycemic control for   <7.0% adults with diabetes     CBG: Recent Labs  Lab 04/05/23 1644 04/05/23 1949 04/05/23 2335 04/06/23 0340  GLUCAP 123* 100* 112* 147*    Review of Systems:   Unable to assess pt mechanically intubated   Past Medical History:  He,  has a past medical history of Alternating RBBB & LBBB, Ankle swelling (2024), Arthritis, Bladder tumor, CAD (coronary artery disease), Cancer (HCC), Cancer of base of tongue (HCC), Essential hypertension, 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   Surgical History:   Past Surgical History:  Procedure Laterality Date   CARDIAC CATHETERIZATION     CATARACT EXTRACTION W/PHACO Left 07/18/2018   Procedure: CATARACT EXTRACTION PHACO AND INTRAOCULAR LENS PLACEMENT (IOC)  LEFT;  Surgeon: Nevada Crane, MD;  Location: [facility];  Service: Ophthalmology;  Laterality: Left;   CATARACT EXTRACTION W/PHACO Right 08/14/2018   Procedure: CATARACT EXTRACTION PHACO AND INTRAOCULAR LENS PLACEMENT (IOC)  RIGHT;  Surgeon: Nevada Crane, MD;  Location: [facility];  Service: Ophthalmology;  Laterality: Right;   CORONARY ARTERY BYPASS GRAFT N/A 08/27/2017   Procedure: CORONARY ARTERY BYPASS GRAFTING (CABG) ON PUMP USING  LEFT INTERNAL MAMMARY ARTERY AND LEFT GREATER SAPHENOUS VEIN VIA ENDOVEIN HARVEST;  Surgeon: Alleen Borne, MD;  Location: [facility] OR;  Service: Open Heart Surgery;  Laterality: N/A;   CORONARY/GRAFT ACUTE MI REVASCULARIZATION N/A 08/27/2017   Procedure: Coronary/Graft Acute MI Revascularization;  Surgeon: Iran Ouch, MD;  Location: [facility] IN[facility];  Service: Cardiovascular;  Laterality: N/A;   CYSTOSCOPY W/ RETROGRADES Bilateral 12/25/2018   Procedure: CYSTOSCOPY WITH RETROGRADE PYELOGRAM;  Surgeon: Vanna Scotland, MD;  Location: [facility] ORS;  Service: Urology;  Laterality: Bilateral;   FRACTURE SURGERY Right 1958   arm and left wrist compound fracture, no metal   HERNIA REPAIR Right    inguinial   IR IMAGING GUIDED PORT INSERTION  10/15/2022   LEFT HEART CATH AND CORONARY ANGIOGRAPHY N/A 08/27/2017   Procedure: LEFT HEART CATH AND CORONARY ANGIOGRAPHY;  Surgeon: Iran Ouch, MD;  Location: [facility];  Service: Cardiovascular;  Laterality: N/A;   RIGHT/LEFT HEART CATH AND CORONARY/GRAFT ANGIOGRAPHY N/A 04/05/2023   Procedure: RIGHT/LEFT HEART CATH AND CORONARY/GRAFT ANGIOGRAPHY;  Surgeon: Yvonne Kendall, MD;  Location: [facility];  Service: Cardiovascular;  Laterality: N/A;   TEMPORARY PACEMAKER N/A 04/05/2023   Procedure: TEMPORARY PACEMAKER;  Surgeon: Yvonne Kendall, MD;  Location: [facility];  Service: Cardiovascular;  Laterality: N/A;   TRANSURETHRAL RESECTION OF BLADDER TUMOR WITH MITOMYCIN-C N/A 12/25/2018   Procedure: TRANSURETHRAL RESECTION OF BLADDER TUMOR WITH Gemcitabine;  Surgeon: Vanna Scotland, MD;  Location: [facility] ORS;  Service: Urology;  Laterality: N/A;     Social History:   reports  that he quit smoking about 25 years ago. His smoking use included cigarettes. His smokeless tobacco use includes snuff. He reports that he does not currently use alcohol. He reports that he does not currently use drugs.   Family History:  His family history  includes Cervical cancer in his maternal grandmother; Heart failure in his mother; Lung disease in his father; Stroke in his father.   Allergies No Known Allergies   Home Medications  Prior to Admission medications   Medication Sig Start Date End Date Taking? Authorizing Provider  acetaminophen (TYLENOL) 650 MG CR tablet Take 650 mg by mouth every 8 (eight) hours as needed for pain.    [provider]  aspirin EC 81 MG EC tablet Take 1 tablet (81 mg total) by mouth daily. 09/03/17   Gold, Glenice Laine, PA-C  atorvastatin (LIPITOR) 80 MG tablet Take 1 tablet by mouth once daily 02/08/23   Creig Hines, NP  carvedilol (COREG) 12.5 MG tablet TAKE 1 TABLET BY MOUTH TWICE DAILY WITH A MEAL 03/10/23   End, Cristal Deer, MD  lidocaine-prilocaine (EMLA) cream Apply on the port. 30 -45 min  prior to port access. 10/08/22   Earna Coder, MD  losartan (COZAAR) 100 MG tablet Take 1 tablet by mouth once daily 03/10/23   End, Cristal Deer, MD  magic mouthwash (multi-ingredient) oral suspension Swish and spit 5-10 mLs 4 (four) times daily as needed. 11/24/22   Earna Coder, MD  Multiple Vitamin (MULTIVITAMIN WITH MINERALS) TABS tablet Take 1 tablet by mouth daily. One a Day over 50/ lunch    [provider]  nitroGLYCERIN (NITROSTAT) 0.4 MG SL tablet DISSOLVE ONE TABLET UNDER THE TONGUE EVERY 5 MINUTES AS NEEDED FOR CHEST PAIN.  DO NOT EXCEED A TOTAL OF 3 DOSES IN 15 MINUTES 03/24/23   Creig Hines, NP  OLANZapine (ZYPREXA) 5 MG tablet Take one pill at night. 11/03/22   Earna Coder, MD  ondansetron (ZOFRAN) 8 MG tablet One pill every 8 hours as needed for nausea/vomitting. 10/08/22   Earna Coder, MD  polyethylene glycol (MIRALAX) 17 g packet Take 17 g by mouth daily. 11/07/22   Romeo Apple, Myah A, PA-C  prochlorperazine (COMPAZINE) 10 MG tablet Take 1 tablet (10 mg total) by mouth every 6 (six) hours as needed for nausea or vomiting. 10/08/22    Earna Coder, MD  senna-docusate (SENOKOT-S) 8.6-50 MG tablet Take 1 tablet by mouth daily. 11/07/22   Romeo Apple, Myah A, PA-C  sucralfate (CARAFATE) 1 g tablet Take 1 tablet (1 g total) by mouth 4 (four) times daily -  with meals and at bedtime. Dissolve in 4 tablespoons warm water 30 minutes before meals. 10/26/22   Carmina Miller, MD  sulfamethoxazole-trimethoprim (BACTRIM DS) 800-160 MG tablet Take 1 tablet by mouth 2 (two) times daily for 10 days. 03/29/23 04/08/23  Becky Augusta, NP  torsemide (DEMADEX) 20 MG tablet Take 1 tablet (20 mg total) by mouth daily as needed (swelling and weight gain). 11/25/22 02/23/23  Creig Hines, NP  traMADol HCl 100 MG TABS Take 100 mg by mouth every 8 (eight) hours as needed. 10/26/22   Earna Coder, MD     Critical care time: 42 minutes      Harlon Ditty, AGACNP-BC [facility] Prefer epic messenger for cross cover needs If after hours, please call E-link

## 2023-04-07 ENCOUNTER — Inpatient Hospital Stay (HOSPITAL_COMMUNITY)
Admit: 2023-04-07 | Discharge: 2023-04-07 | Disposition: A | Discharge status: Home / Self Care | Payer: Medicare Other | Attending: Internal Medicine | Admitting: Internal Medicine

## 2023-04-07 ENCOUNTER — Inpatient Hospital Stay: Payer: Medicare Other

## 2023-04-07 DIAGNOSIS — N179 Acute kidney failure, unspecified: Secondary | ICD-10-CM | POA: Diagnosis not present

## 2023-04-07 DIAGNOSIS — I469 Cardiac arrest, cause unspecified: Secondary | ICD-10-CM

## 2023-04-07 DIAGNOSIS — E875 Hyperkalemia: Secondary | ICD-10-CM | POA: Diagnosis not present

## 2023-04-07 DIAGNOSIS — Z7189 Other specified counseling: Secondary | ICD-10-CM | POA: Diagnosis not present

## 2023-04-07 LAB — ECHOCARDIOGRAM COMPLETE
AR max vel: 2.45 cm2
AV Area VTI: 2.35 cm2
AV Area mean vel: 2.37 cm2
AV Mean grad: 2 mm[Hg]
AV Peak grad: 3.2 mm[Hg]
Ao pk vel: 0.9 m/s
Area-P 1/2: 3.77 cm2
Height: 72 in
MV VTI: 1.93 cm2
S' Lateral: 3 cm
Weight: 3925.95 [oz_av]

## 2023-04-07 LAB — LIPID PANEL
Cholesterol: 69 mg/dL (ref 0–200)
HDL: 21 mg/dL — ABNORMAL LOW (ref >40)
LDL Cholesterol: 25 mg/dL (ref 0–99)
Total CHOL/HDL Ratio: 3.3 {ratio}
Triglycerides: 114 mg/dL (ref <150)
VLDL: 23 mg/dL (ref 0–40)

## 2023-04-07 LAB — BASIC METABOLIC PANEL
Anion gap: 9 (ref 5–15)
Anion gap: 9 (ref 5–15)
BUN: 50 mg/dL — ABNORMAL HIGH (ref 8–23)
BUN: 50 mg/dL — ABNORMAL HIGH (ref 8–23)
CO2: 23 mmol/L (ref 22–32)
CO2: 26 mmol/L (ref 22–32)
Calcium: 9 mg/dL (ref 8.9–10.3)
Calcium: 8.7 mg/dL — ABNORMAL LOW (ref 8.9–10.3)
Chloride: 105 mmol/L (ref 98–111)
Chloride: 105 mmol/L (ref 98–111)
Creatinine, Ser: 2.3 mg/dL — ABNORMAL HIGH (ref 0.61–1.24)
Creatinine, Ser: 2.28 mg/dL — ABNORMAL HIGH (ref 0.61–1.24)
GFR, Estimated: 29 mL/min — ABNORMAL LOW (ref >60)
GFR, Estimated: 29 mL/min — ABNORMAL LOW (ref >60)
Glucose, Bld: 168 mg/dL — ABNORMAL HIGH (ref 70–99)
Glucose, Bld: 87 mg/dL (ref 70–99)
Potassium: 5.3 mmol/L — ABNORMAL HIGH (ref 3.5–5.1)
Potassium: 4.2 mmol/L (ref 3.5–5.1)
Sodium: 137 mmol/L (ref 135–145)
Sodium: 140 mmol/L (ref 135–145)

## 2023-04-07 LAB — BLOOD GAS, ARTERIAL
Acid-base deficit: 1.5 mmol/L (ref 0.0–2.0)
Bicarbonate: 24.3 mmol/L (ref 20.0–28.0)
FIO2: 40 %
MECHVT: 450 mL
Mechanical Rate: 26
O2 Saturation: 100 %
PEEP: 12 cmH2O
Patient temperature: 37
pCO2 arterial: 44 mm[Hg] (ref 32–48)
pH, Arterial: 7.35 (ref 7.35–7.45)
pO2, Arterial: 103 mm[Hg] (ref 83–108)

## 2023-04-07 LAB — HEPATIC FUNCTION PANEL
ALT: 146 U/L — ABNORMAL HIGH (ref 0–44)
AST: 83 U/L — ABNORMAL HIGH (ref 15–41)
Albumin: 2.6 g/dL — ABNORMAL LOW (ref 3.5–5.0)
Alkaline Phosphatase: 80 U/L (ref 38–126)
Bilirubin, Direct: 0.2 mg/dL (ref 0.0–0.2)
Indirect Bilirubin: 0.5 mg/dL (ref 0.3–0.9)
Total Bilirubin: 0.7 mg/dL (ref 0.0–1.2)
Total Protein: 5.5 g/dL — ABNORMAL LOW (ref 6.5–8.1)

## 2023-04-07 LAB — COOXEMETRY PANEL
Carboxyhemoglobin: 2.1 % — ABNORMAL HIGH (ref 0.5–1.5)
Carboxyhemoglobin: 2.1 % — ABNORMAL HIGH (ref 0.5–1.5)
Methemoglobin: 1.6 % — ABNORMAL HIGH (ref 0.0–1.5)
Methemoglobin: 1.1 % (ref 0.0–1.5)
O2 Saturation: 74.7 %
O2 Saturation: 73.6 %
Total hemoglobin: 10.8 g/dL — ABNORMAL LOW (ref 12.0–16.0)
Total hemoglobin: 11 g/dL — ABNORMAL LOW (ref 12.0–16.0)
Total oxygen content: 72 %
Total oxygen content: 71.2 %

## 2023-04-07 LAB — CBC
HCT: 30.8 % — ABNORMAL LOW (ref 39.0–52.0)
Hemoglobin: 10.2 g/dL — ABNORMAL LOW (ref 13.0–17.0)
MCH: 32.8 pg (ref 26.0–34.0)
MCHC: 33.1 g/dL (ref 30.0–36.0)
MCV: 99 fL (ref 80.0–100.0)
Platelets: 176 10*3/uL (ref 150–400)
RBC: 3.11 MIL/uL — ABNORMAL LOW (ref 4.22–5.81)
RDW: 11.8 % (ref 11.5–15.5)
WBC: 29.7 10*3/uL — ABNORMAL HIGH (ref 4.0–10.5)
nRBC: 0 % (ref 0.0–0.2)

## 2023-04-07 LAB — HEPARIN LEVEL (UNFRACTIONATED)
Heparin Unfractionated: 0.27 [IU]/mL — ABNORMAL LOW (ref 0.30–0.70)
Heparin Unfractionated: 0.56 [IU]/mL (ref 0.30–0.70)
Heparin Unfractionated: 0.74 [IU]/mL — ABNORMAL HIGH (ref 0.30–0.70)

## 2023-04-07 LAB — PHOSPHORUS: Phosphorus: 4.4 mg/dL (ref 2.5–4.6)

## 2023-04-07 LAB — GLUCOSE, CAPILLARY
Glucose-Capillary: 154 mg/dL — ABNORMAL HIGH (ref 70–99)
Glucose-Capillary: 142 mg/dL — ABNORMAL HIGH (ref 70–99)
Glucose-Capillary: 118 mg/dL — ABNORMAL HIGH (ref 70–99)
Glucose-Capillary: 72 mg/dL (ref 70–99)
Glucose-Capillary: 75 mg/dL (ref 70–99)
Glucose-Capillary: 94 mg/dL (ref 70–99)

## 2023-04-07 LAB — T3, FREE: T3, Free: 2.2 pg/mL (ref 2.0–4.4)

## 2023-04-07 LAB — LACTIC ACID, PLASMA: Lactic Acid, Venous: 1.5 mmol/L (ref 0.5–1.9)

## 2023-04-07 LAB — LIPOPROTEIN A (LPA): Lipoprotein (a): 21.1 nmol/L (ref <75.0)

## 2023-04-07 LAB — MAGNESIUM: Magnesium: 1.8 mg/dL (ref 1.7–2.4)

## 2023-04-07 MED ORDER — OXYCODONE HCL 5 MG PO TABS
5.0000 mg | ORAL_TABLET | Freq: Four times a day (QID) | ORAL | Status: DC
  Administered 2023-04-08: 5 mg
  Filled 2023-04-07: qty 1

## 2023-04-07 MED ORDER — FUROSEMIDE 10 MG/ML IJ SOLN
40.0000 mg | Freq: Once | INTRAMUSCULAR | Status: AC
  Administered 2023-04-07: 40 mg via INTRAVENOUS
  Filled 2023-04-07: qty 4

## 2023-04-07 MED ORDER — HEPARIN BOLUS VIA INFUSION
1400.0000 [IU] | Freq: Once | INTRAVENOUS | Status: AC
  Administered 2023-04-07: 1400 [IU] via INTRAVENOUS
  Filled 2023-04-07: qty 1400

## 2023-04-07 MED ORDER — PROSOURCE TF20 ENFIT COMPATIBL EN LIQD
60.0000 mL | Freq: Two times a day (BID) | ENTERAL | Status: DC
  Administered 2023-04-08 – 2023-04-12 (×10): 60 mL
  Filled 2023-04-07 (×11): qty 60

## 2023-04-07 MED ORDER — OXYCODONE HCL 5 MG PO TABS
5.0000 mg | ORAL_TABLET | Freq: Four times a day (QID) | ORAL | Status: DC
  Administered 2023-04-07 (×2): 5 mg via ORAL
  Filled 2023-04-07 (×2): qty 1

## 2023-04-07 MED ORDER — VITAL HIGH PROTEIN PO LIQD
1000.0000 mL | ORAL | Status: DC
  Administered 2023-04-07 – 2023-04-12 (×6): 1000 mL

## 2023-04-07 MED ORDER — ATORVASTATIN CALCIUM 80 MG PO TABS
80.0000 mg | ORAL_TABLET | Freq: Every day | ORAL | Status: DC
  Administered 2023-04-09 – 2023-04-21 (×11): 80 mg
  Filled 2023-04-07 (×11): qty 1

## 2023-04-07 MED ORDER — MAGNESIUM SULFATE 2 GM/50ML IV SOLN
2.0000 g | Freq: Once | INTRAVENOUS | Status: AC
  Administered 2023-04-07: 2 g via INTRAVENOUS
  Filled 2023-04-07: qty 50

## 2023-04-07 MED ORDER — FREE WATER
30.0000 mL | Status: DC
  Administered 2023-04-07 – 2023-04-11 (×22): 30 mL

## 2023-04-07 NOTE — Progress Notes (Signed)
PHARMACY - ANTICOAGULATION CONSULT NOTE  Pharmacy Consult for Heparin Drip Indication: empirically treat for pulmonary embolism with IV heparin, to begin 2 hours after hemostasis was achieved at the right femoral arteriotomy with Mynx deployment. - per Cardiology note 04/05/23  Patient Measurements: Height: 6' (182.9 cm) Weight: 104.9 kg (231 lb 4.2 oz) IBW/kg (Calculated) : 77.6 Heparin Dosing Weight: 99 kg  Labs: Recent Labs    04/05/23 1251 04/05/23 1423 04/05/23 1459 04/05/23 1658 04/05/23 1703 04/05/23 1743 04/05/23 2226 04/06/23 0316 04/06/23 1042 04/06/23 1408 04/06/23 1808 04/07/23 0005  HGB 12.5*   < > 10.9*  --   --  12.8*  --  13.0  --   --   --   --   HCT 40.8   < > 32.0*  --   --  37.3*  --  38.2*  --   --   --   --   PLT 306  --   --   --   --  277  --  291  --   --   --   --   APTT  --   --   --   --  37*  --   --   --   --   --   --   --   LABPROT  --   --   --   --  17.4*  --   --   --   --   --   --   --   INR  --   --   --   --  1.4*  --   --   --   --   --   --   --   HEPARINUNFRC  --   --   --   --   --   --   --  >1.10*  --  1.03*  --  0.74*  CREATININE 2.32*  --   --   --  2.33*  --    < > 2.78* 2.63*  --  2.42*  --   TROPONINIHS 21*  --   --  898*  --   --   --   --   --   --   --   --    < > = values in this interval not displayed.    Estimated Creatinine Clearance: 32.5 mL/min (A) (by C-G formula based on SCr of 2.42 mg/dL (H)).   Medical History: Past Medical History:  Diagnosis Date   Alternating RBBB & LBBB    Ankle swelling 2024   Arthritis    neck   Bladder tumor    CAD (coronary artery disease)    a. 08/2017 STEMI/Cath: LM 80ost/d, LAD 100p, LCX 95 ost/p, RCA 60p, 66m, RPDA 80ost; b. 08/2017 CABG x 3: LIMA->LAD, VG->OM, VG->PDA.   Cancer [facility])    bladder tumor   Cancer of base of tongue (HCC)    Essential hypertension    HFimpEF (heart failure with improved ejection fraction) (HCC)    a. 08/2017 TEE: EF 30-35%, sept/ant/inf HK,  apical AK. 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 12/2017 Echo: EF 45-50%, diff HK. Gr1 DD. Mildly reduced RV fxn. Mild RAE; c. 09/2022 Echo: EF 55-60%, mod LVH, GrI DD, nl RV fxn, mildly dil RA, mild MR.   Hyperlipidemia LDL goal <70    Ischemic cardiomyopathy    a. 08/2017 TEE: EF 30-35%; b. 12/2017 Echo: EF 45-50%; c. 09/2022 Echo: EF 55-60%.  Myocardial infarction (HCC) 08/27/2017   Shingles    2018 - right side   Vertigo    several months ago   Assessment: 78 yo M to start heparin drip to empirically treat for pulmonary embolism with IV heparin, to begin 2 hours after hemostasis was achieved at the right femoral arteriotomy with Mynx deployment.  Pt with Cardiac arrest, complete heart block, and cardiogenic shock s/p cardiac cath: showed severe native CAD with patent LIMA-LAD and SVG-OM. SVG-RCA is occluded, though this does not appear acute. Pulmonary embolism is also a consideration given active cancer.   Baseline labs: Hgb 12.8 Plt 277  INR 1.4  aPTT 37  (did receive some heparin in cath) No anticoagulation PTA per Med rec and Rx fill hx, chart search.  Goal of Therapy:  Heparin level 0.3-0.7 units/ml Monitor platelets by anticoagulation protocol: Yes  0212 0316 HL>1.10, SUPRAthera; 1600 units/hr 0212 1408 HL 1.03, SUPRAthera; 1300 units/hr 0213 0005 HL 0.74, SUPRAthera; 1000 units/hr    Plan:  --Decrease heparin infusion to 900 units/hr --Re-check HL 8 hours after rate change --Daily CBC per protocol while on IV heparin  Angalina Ante A Maurice Ramseur 04/07/2023,12:37 AM

## 2023-04-07 NOTE — Progress Notes (Signed)
PHARMACY - ANTICOAGULATION CONSULT NOTE  Pharmacy Consult for Heparin Drip Indication: empirically treat for pulmonary embolism with IV heparin, to begin 2 hours after hemostasis was achieved at the right femoral arteriotomy with Mynx deployment. - per Cardiology note 04/05/23  Patient Measurements: Height: 6' (182.9 cm) Weight: 111.3 kg (245 lb 6 oz) IBW/kg (Calculated) : 77.6 Heparin Dosing Weight: 99 kg  Labs: Recent Labs    04/05/23 1251 04/05/23 1423 04/05/23 1658 04/05/23 1703 04/05/23 1743 04/05/23 2226 04/06/23 0316 04/06/23 1042 04/06/23 1408 04/06/23 1808 04/07/23 0005 04/07/23 0401 04/07/23 0936  HGB 12.5*   < >  --   --  12.8*  --  13.0  --   --   --   --  10.2*  --   HCT 40.8   < >  --   --  37.3*  --  38.2*  --   --   --   --  30.8*  --   PLT 306  --   --   --  277  --  291  --   --   --   --  176  --   APTT  --   --   --  37*  --   --   --   --   --   --   --   --   --   LABPROT  --   --   --  17.4*  --   --   --   --   --   --   --   --   --   INR  --   --   --  1.4*  --   --   --   --   --   --   --   --   --   HEPARINUNFRC  --   --   --   --   --    < > >1.10*  --  1.03*  --  0.74*  --  0.56  CREATININE 2.32*  --   --  2.33*  --    < > 2.78* 2.63*  --  2.42*  --  2.30*  --   TROPONINIHS 21*  --  898*  --   --   --   --   --   --   --   --   --   --    < > = values in this interval not displayed.    Estimated Creatinine Clearance: 35.2 mL/min (A) (by C-G formula based on SCr of 2.3 mg/dL (H)).   Medical History: Past Medical History:  Diagnosis Date   Alternating RBBB & LBBB    Ankle swelling 2024   Arthritis    neck   Bladder tumor    CAD (coronary artery disease)    a. 08/2017 STEMI/Cath: LM 80ost/d, LAD 100p, LCX 95 ost/p, RCA 60p, 58m, RPDA 80ost; b. 08/2017 CABG x 3: LIMA->LAD, VG->OM, VG->PDA.   Cancer [facility])    bladder tumor   Cancer of base of tongue (HCC)    Essential hypertension    HFimpEF (heart failure with improved ejection fraction)  (HCC)    a. 08/2017 TEE: EF 30-35%, sept/ant/inf HK, apical AK. 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 12/2017 Echo: EF 45-50%, diff HK. Gr1 DD. Mildly reduced RV fxn. Mild RAE; c. 09/2022 Echo: EF 55-60%, mod LVH, GrI DD, nl RV fxn, mildly dil RA, mild MR.   Hyperlipidemia LDL goal <  70    Ischemic cardiomyopathy    a. 08/2017 TEE: EF 30-35%; b. 12/2017 Echo: EF 45-50%; c. 09/2022 Echo: EF 55-60%.   Myocardial infarction (HCC) 08/27/2017   Shingles    2018 - right side   Vertigo    several months ago   Assessment: 78 yo M to start heparin drip to empirically treat for pulmonary embolism with IV heparin, to begin 2 hours after hemostasis was achieved at the right femoral arteriotomy with Mynx deployment.  Pt with Cardiac arrest, complete heart block, and cardiogenic shock s/p cardiac cath: showed severe native CAD with patent LIMA-LAD and SVG-OM. SVG-RCA is occluded, though this does not appear acute. Pulmonary embolism is also a consideration given active cancer.   Baseline labs: Hgb 12.8 Plt 277  INR 1.4  aPTT 37  (did receive some heparin in cath) No anticoagulation PTA per Med rec and Rx fill hx, chart search.  Goal of Therapy:  Heparin level 0.3-0.7 units/ml Monitor platelets by anticoagulation protocol: Yes  0212 0316 HL>1.10, SUPRAthera; 1600 units/hr 0212 1408 HL 1.03, SUPRAthera; 1300 units/hr 0213 0005 HL 0.74, SUPRAthera; 1000 units/hr 0213 0936 HL 0.56, Therapeutic x 1; 900 units/hr  Plan:  -- Heparin is therapeutic x 1 -- Continue heparin IV 900 units/hr -- Re-check HL 8 hours to confirm if therapeutic; if therapeutic x1 then move to daily HL checks  -- Daily CBC per protocol while on IV heparin  Effie Shy, PharmD Pharmacy Resident  04/07/2023 12:43 PM

## 2023-04-07 NOTE — Progress Notes (Signed)
PHARMACY - ANTICOAGULATION CONSULT NOTE  Pharmacy Consult for Heparin Drip Indication: empirically treat for pulmonary embolism with IV heparin, to begin 2 hours after hemostasis was achieved at the right femoral arteriotomy with Mynx deployment. - per Cardiology note 04/05/23  Patient Measurements: Height: 6' (182.9 cm) Weight: 111.3 kg (245 lb 6 oz) IBW/kg (Calculated) : 77.6 Heparin Dosing Weight: 99 kg  Labs: Recent Labs    04/05/23 1251 04/05/23 1423 04/05/23 1658 04/05/23 1703 04/05/23 1743 04/05/23 2226 04/06/23 0316 04/06/23 1042 04/06/23 1808 04/07/23 0005 04/07/23 0401 04/07/23 0936 04/07/23 1802  HGB 12.5*   < >  --   --  12.8*  --  13.0  --   --   --  10.2*  --   --   HCT 40.8   < >  --   --  37.3*  --  38.2*  --   --   --  30.8*  --   --   PLT 306  --   --   --  277  --  291  --   --   --  176  --   --   APTT  --   --   --  37*  --   --   --   --   --   --   --   --   --   LABPROT  --   --   --  17.4*  --   --   --   --   --   --   --   --   --   INR  --   --   --  1.4*  --   --   --   --   --   --   --   --   --   HEPARINUNFRC  --   --   --   --   --   --  >1.10*   < >  --  0.74*  --  0.56 0.27*  CREATININE 2.32*  --   --  2.33*  --    < > 2.78*   < > 2.42*  --  2.30*  --  2.28*  TROPONINIHS 21*  --  898*  --   --   --   --   --   --   --   --   --   --    < > = values in this interval not displayed.    Estimated Creatinine Clearance: 35.5 mL/min (A) (by C-G formula based on SCr of 2.28 mg/dL (H)).   Medical History: Past Medical History:  Diagnosis Date   Alternating RBBB & LBBB    Ankle swelling 2024   Arthritis    neck   Bladder tumor    CAD (coronary artery disease)    a. 08/2017 STEMI/Cath: LM 80ost/d, LAD 100p, LCX 95 ost/p, RCA 60p, 59m, RPDA 80ost; b. 08/2017 CABG x 3: LIMA->LAD, VG->OM, VG->PDA.   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 08/2017 TEE: EF 30-35%, sept/ant/inf HK, apical AK. 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 12/2017 Echo: EF 45-50%, diff HK. Gr1 DD. Mildly reduced RV fxn. Mild RAE; c. 09/2022 Echo: EF 55-60%, mod LVH, GrI DD, nl RV fxn, mildly dil RA, mild MR.   Hyperlipidemia LDL  goal <70    Ischemic cardiomyopathy    a. 08/2017 TEE: EF 30-35%; b. 12/2017 Echo: EF 45-50%; c. 09/2022 Echo: EF 55-60%.   Myocardial infarction (HCC) 08/27/2017   Shingles    2018 - right side   Vertigo    several months ago   Assessment: 78 yo M to start heparin drip to empirically treat for pulmonary embolism with IV heparin, to begin 2 hours after hemostasis was achieved at the right femoral arteriotomy with Mynx deployment.  Pt with Cardiac arrest, complete heart block, and cardiogenic shock s/p cardiac cath: showed severe native CAD with patent LIMA-LAD and SVG-OM. SVG-RCA is occluded, though this does not appear acute. Pulmonary embolism is also a consideration given active cancer.   2/13: Unable to perform CTA PE Chest due to acute kidney injury consider lung VQ scan once stabilized to travel ~ Venous [place] of Bilateral LE's negative for DVT   Baseline labs: Hgb 12.8 Plt 277  INR 1.4  aPTT 37  (did receive some heparin in cath) No anticoagulation PTA per Med rec and Rx fill hx, chart search.  Goal of Therapy:  Heparin level 0.3-0.7 units/ml Monitor platelets by anticoagulation protocol: Yes  0212 0316 HL>1.10, SUPRAthera; 1600 units/hr 0212 1408 HL 1.03, SUPRAthera; 1300 units/hr 0213 0005 HL 0.74, SUPRAthera; 1000 units/hr 0213 0936 HL 0.56, Therapeutic x 1; 900 units/hr 0213 1802 HL 0.27, SUBtherapeutic, 900> 1100 units/hr  Plan:  -- Will order heparin bolus 1400 units x 1 -- increase heparin drip from 900 to 1100 units/hr -- Re-check HL 8 hours after rate change -- Daily CBC per protocol while on IV heparin  Cloee Dunwoody A, PharmD 04/07/2023 7:15 PM

## 2023-04-07 NOTE — Progress Notes (Signed)
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  He presented to [facility] ER via EMS from [facility] following a witnessed cardiac arrest.  Bystander CPR initiated and EMS arrived on the scene within 30 seconds cardiac rhythm asystole.  ROSC initially achieved, however pt in and out of PEA/asystole.    Pt recently evaluated in the ER on 02/4 with urinary urgency and frequency that started 5 days prior to that presentation.  Diagnosed with a UTI and prescribed Bactrim DS twice daily for 10 days.  However, symptoms persisted prompting [facility] visit today.  ED Course  Upon arrival to the ER pt on NRB with pulse.  It was reported during chest compressions the pt began to fight and pulling at medical devices.  While in the the ER waiting room the pt cardiac arrested again requiring ACLS protocol and mechanical intubation.  Pt with varied cardiac rhythm during cardiac arrest PEA/asystole/complete heart block.  He required transcutaneously pacing.  EKG showed apparent sinus rhythm with complete heart block and ventricular escape rhythm with LBBB morphology.  Total downtime estimated 2hrs from out of hospital cardiac arrest and inpatient cardiac arrest.  Pt required epinephrine gtt due to bradycardia and hypotension.  Pt taken emergently to the cardiac cath lab for temporary transvenous pacemaker placement, emergent cardiac catheterization, and swan-ganz catheter placement.   Cardiac cath revealed severe native CAD with patent LIMA-LAD and SVG-OM. SVG-RCA is occluded, but did not appear  acute and unlikely the inciting factor for today's cardiac arrest.  Per cardiology if pt makes a meaningful recovery he may require revascularization of the RCA.  Pt admitted to the ICU post cardiac cath PCCM consulted to asume care.    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  Worsening AKI and mild hyperkalemia, given shifting measures.  Low threshold for Nephrology consultation. 02/13: Weaning down vasopressors, Vasopressin off, Levo @ 7, Epi @ 6.  On minimal vent support.  Renal function and hyperkalemia slowly improving.  Advanced Heart Failure diuresing with 40 mg IV Lasix x1 dose for CVP of 15.  Perform WUA.  Interim History / Subjective:  As outlined above under Significant Hospital Events section   Micro Data:  2/11: COVID-19 PCR>> negative 2/11: MRSA PCR>> negative 2/11: Urine>> no growth 2/12: Blood cultures x2>> no growth to date  Antimicrobials:   Anti-infectives (From admission, onward)    Start     Dose/Rate Route Frequency Ordered Stop   04/06/23 1100  vancomycin (VANCOREADY) IVPB 2000 mg/400 mL        2,000 mg 200 mL/hr over 120 Minutes Intravenous  Once  04/06/23 1006 04/06/23 1253   04/06/23 1100  piperacillin-tazobactam (ZOSYN) IVPB 3.375 g        3.375 g 12.5 mL/hr over 240 Minutes Intravenous Every 8 hours 04/06/23 1006     04/05/23 1830  cefTRIAXone (ROCEPHIN) 2 g in sodium chloride 0.9 % 100 mL  IVPB  Status:  Discontinued        2 g 200 mL/hr over 30 Minutes Intravenous Every 24 hours 04/05/23 1736 04/06/23 0827      Objective   Blood pressure (!) 143/61, pulse 71, temperature 97.7 F (36.5 C), resp. rate (!) 24, height 6' (1.829 m), weight 104.9 kg, SpO2 100%. PAP: (22-36)/(16-28) 32/20 CVP:  [7 mmHg-16 mmHg] 15 mmHg CO:  [4.1 L/min-4.4 L/min] 4.4 L/min CI:  [1.8 L/min/m2-1.94 L/min/m2] 1.94 L/min/m2  Vent Mode: PRVC FiO2 (%):  [35 %-50 %] 35 % Set Rate:  [26 bmp] 26 bmp Vt Set:  [450 mL] 450 mL PEEP:  [12 cmH20] 12 cmH20 Plateau Pressure:  [15 cmH20-25 cmH20] 25 cmH20   Intake/Output Summary (Last 24 hours) at 04/07/2023 9562 Last data filed at 04/07/2023 0400 Gross per 24 hour  Intake 3012.93 ml  Output 885 ml  Net 2127.93 ml   Filed Weights   04/05/23 1645  Weight: 104.9 kg    Examination: General: Acute on chronically-ill appearing male, NAD mechanically intubated  HENT: Supple, mild JVD  Lungs: Faint rhonchi throughout, even, non labored, occasionally overbreathing the vent Cardiovascular: Paced rhythm, no m/r/g, 2+ radial/1+ distal pulses, bilateral extremities cool to touch  Abdomen: +BS x4, obese, soft non distended  Extremities: Normal bulk and tone, no deformities, mild mottling Neuro: Sedated, currently not following withdrawing from pain or following commands, PERRL  GU: Indwelling foley catheter draining yellow urine   Resolved Hospital Problem list     Assessment & Plan:   #Acute metabolic encephalopathy  #Mechanical intubation pain/discomfort  -Maintain a RASS goal of 0 to -1 -Fentanyl and Propofol as needed to maintain RASS goal -Avoid sedating medications as able -Daily wake up assessment  #Cardiac arrest  #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 SVG-RCA  is occluded, but did not appear acute and unlikely the inciting factor for today's cardiac arrest -Continuous cardiac monitoring -Maintain MAP >65 -Vasopressors as needed to maintain MAP goal -Lactic acid has normalized -Trend HS Troponin until peaked -Trend Coox panel -Repeat Echocardiogram pending -Diuresis as BP and renal function permits ~ 40 mg IV Lasix x1 dose on 2/13 -Cardiology following, appreciate input: recommending if pt has a meaningful recovery may require revascularization of the RCA and  if junctional rhythm persist start amiodarone gtt -Heparin gtt per cardiology recommendations -Advanced heart failure team following along: left filling pressures mildly elevated; right heart filling pressures moderately elevated will continue pulmonary artery catheter for continuous hemodynamic monitoring -GDMT on hold for now  -Thyroid elevated at 18, but free T4 normal at 0.93  #Acute hypoxic hypercapnic respiratory failure  #Possible PE #Aspiration pneumonia secondary to acute encephalopathy post cardiac arrest  #Mechanical intubation  -Full vent support, implement lung protective strategies -Plateau pressures less than 30 cm H20 -Wean FiO2 & PEEP as tolerated to maintain O2 sats >92% -Follow intermittent Chest X-ray & ABG as needed -Spontaneous Breathing Trials when respiratory parameters met and mental status permits -Implement VAP Bundle -Prn Bronchodilators -ABX as above -Heparin gtt: dosing  per pharmacy  -Unable to perform CTA PE Chest due to acute kidney injury consider lung VQ scan once stabilized to travel ~ Venous [place] of Bilateral LE's negative for DVT  #Acute kidney injury secondary to ATN ~ improving #Hyperkalemia ~ improving #Anion gap metabolic acidosis  #Lactic Acidosis -Monitor I&O's / urinary output -Follow BMP -Ensure adequate renal perfusion -Avoid nephrotoxic agents as able -Replace electrolytes as indicated ~ Pharmacy following for assistance with  electrolyte replacement -Continue shifting measures: Lokelma ~ Repeat BMP later this afternoon -Low threshold for Nephrology consultation  #UTI  #Aspiration pneumonia  -Monitor fever curve -Trend WBC's & Procalcitonin -Follow cultures as above -Broaden ABX to empiric Vancomycin and Zoysn pending cultures & sensitivities given critical illness  #Transaminitis  - Trend hepatic function panel  - Avoid hepatotoxic medications as able   #Hyperglycemia  - Hemoglobin A1c pending  - CBG's q4hrs  - Very sensitive SSI  - Follow hyper/hypoglycemic protocol  - Target CBG readings 140 to 180    Best Practice (right click and "Reselect all SmartList Selections" daily)   Diet/type: NPO, start tube feeds DVT prophylaxis systemic heparin Pressure ulcer(s): N/A GI prophylaxis: H2B Lines: Swan-ganz catheter and still needed; portacath present on admission  Foley:  Yes, and it is still needed Code Status:  full code Last date of multidisciplinary goals of care discussion [2/13]  2/13: Dr. Larinda Buttery updated pt's sister at bedside on plan of care.  Labs   CBC: Recent Labs  Lab 04/05/23 1251 04/05/23 1423 04/05/23 1458 04/05/23 1459 04/05/23 1743 04/06/23 0316 04/07/23 0401  WBC 13.7*  --   --   --  29.0* 34.5* 29.7*  NEUTROABS  --   --   --   --  25.2*  --   --   HGB 12.5*   < > 10.9* 10.9* 12.8* 13.0 10.2*  HCT 40.8   < > 32.0* 32.0* 37.3* 38.2* 30.8*  MCV 108.8*  --   --   --  98.4 97.7 99.0  PLT 306  --   --   --  277 291 176   < > = values in this interval not displayed.    Basic Metabolic Panel: Recent Labs  Lab 04/05/23 1658 04/05/23 1703 04/05/23 1813 04/05/23 2226 04/06/23 0316 04/06/23 0603 04/06/23 1042 04/06/23 1808 04/07/23 0401  NA  --    < >  --  142 140 139 137 135 137  K  --    < >  --  5.3* 7.1* 5.6* 5.3* 5.3* 5.3*  CL  --    < >  --  105 105 106 105 103 105  CO2  --    < >  --  26 24 23 22 24 23   GLUCOSE  --    < >  --  144* 162*  --  225* 165* 168*   BUN  --    < >  --  43* 47*  --  48* 49* 50*  CREATININE  --    < >  --  2.57* 2.78*  --  2.63* 2.42* 2.30*  CALCIUM  --    < >  --  10.8* 10.5*  --  9.9 9.4 9.0  MG 2.5*  --   --   --  2.1  --   --   --  1.8  PHOS  --   --  6.7*  --  4.0  --   --   --  4.4   < > =  values in this interval not displayed.   GFR: Estimated Creatinine Clearance: 34.2 mL/min (A) (by C-G formula based on SCr of 2.3 mg/dL (H)). Recent Labs  Lab 04/05/23 1251 04/05/23 1428 04/05/23 1658 04/05/23 1743 04/05/23 2035 04/06/23 0316 04/06/23 1042 04/06/23 1408 04/07/23 0401  PROCALCITON  --   --  0.28  --   --  7.41  --   --   --   WBC 13.7*  --   --  29.0*  --  34.5*  --   --  29.7*  LATICACIDVEN >9.0*   < > 2.9* 2.9* 3.3* 3.0* 3.7* 2.8*  --    < > = values in this interval not displayed.    Liver Function Tests: Recent Labs  Lab 04/05/23 1251 04/05/23 1703  AST 83* 273*  ALT 134* 300*  ALKPHOS 99 124  BILITOT 0.8 0.9  PROT 6.8 5.9*  ALBUMIN 3.5 2.9*   No results for input(s): "LIPASE", "AMYLASE" in the last 168 hours. No results for input(s): "AMMONIA" in the last 168 hours.  ABG    Component Value Date/Time   PHART 7.35 04/07/2023 0500   PCO2ART 44 04/07/2023 0500   PO2ART 103 04/07/2023 0500   HCO3 24.3 04/07/2023 0500   TCO2 33 (H) 04/05/2023 1459   ACIDBASEDEF 1.5 04/07/2023 0500   O2SAT 74.7 04/07/2023 0600     Coagulation Profile: Recent Labs  Lab 04/05/23 1703  INR 1.4*    Cardiac Enzymes: No results for input(s): "CKTOTAL", "CKMB", "CKMBINDEX", "TROPONINI" in the last 168 hours.  HbA1C: Hgb A1c MFr Bld  Date/Time Value Ref Range Status  04/05/2023 07:38 PM 5.9 (H) 4.8 - 5.6 % Final    Comment:    (NOTE) Pre diabetes:          5.7%-6.4%  Diabetes:              >6.4%  Glycemic control for   <7.0% adults with diabetes   08/28/2017 02:27 AM 5.6 4.8 - 5.6 % Final    Comment:    (NOTE) Pre diabetes:          5.7%-6.4% Diabetes:              >6.4% Glycemic  control for   <7.0% adults with diabetes     CBG: Recent Labs  Lab 04/06/23 1932 04/06/23 1945 04/06/23 2315 04/07/23 0323 04/07/23 0740  GLUCAP 141* 131* 159* 154* 142*    Review of Systems:   Unable to assess pt mechanically intubated   Past Medical History:  He,  has a past medical history of Alternating RBBB & LBBB, Ankle swelling (2024), Arthritis, Bladder tumor, CAD (coronary artery disease), Cancer (HCC), Cancer of base of tongue (HCC), Essential hypertension, 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   Surgical History:   Past Surgical History:  Procedure Laterality Date   CARDIAC CATHETERIZATION     CATARACT EXTRACTION W/PHACO Left 07/18/2018   Procedure: CATARACT EXTRACTION PHACO AND INTRAOCULAR LENS PLACEMENT (IOC)  LEFT;  Surgeon: Nevada Crane, MD;  Location: [facility];  Service: Ophthalmology;  Laterality: Left;   CATARACT EXTRACTION W/PHACO Right 08/14/2018   Procedure: CATARACT EXTRACTION PHACO AND INTRAOCULAR LENS PLACEMENT (IOC)  RIGHT;  Surgeon: Nevada Crane, MD;  Location: [facility];  Service: Ophthalmology;  Laterality: Right;   CORONARY ARTERY BYPASS GRAFT N/A 08/27/2017   Procedure: CORONARY ARTERY BYPASS GRAFTING (CABG) ON PUMP  USING LEFT INTERNAL MAMMARY ARTERY AND LEFT GREATER SAPHENOUS VEIN VIA ENDOVEIN HARVEST;  Surgeon: Alleen Borne, MD;  Location: [facility] OR;  Service: Open Heart Surgery;  Laterality: N/A;   CORONARY/GRAFT ACUTE MI REVASCULARIZATION N/A 08/27/2017   Procedure: Coronary/Graft Acute MI Revascularization;  Surgeon: Iran Ouch, MD;  Location: [facility] IN[facility];  Service: Cardiovascular;  Laterality: N/A;   CYSTOSCOPY W/ RETROGRADES Bilateral 12/25/2018   Procedure: CYSTOSCOPY WITH RETROGRADE PYELOGRAM;  Surgeon: Vanna Scotland, MD;  Location: [facility] ORS;  Service:  Urology;  Laterality: Bilateral;   FRACTURE SURGERY Right 1958   arm and left wrist compound fracture, no metal   HERNIA REPAIR Right    inguinial   IR IMAGING GUIDED PORT INSERTION  10/15/2022   LEFT HEART CATH AND CORONARY ANGIOGRAPHY N/A 08/27/2017   Procedure: LEFT HEART CATH AND CORONARY ANGIOGRAPHY;  Surgeon: Iran Ouch, MD;  Location: [facility];  Service: Cardiovascular;  Laterality: N/A;   RIGHT/LEFT HEART CATH AND CORONARY/GRAFT ANGIOGRAPHY N/A 04/05/2023   Procedure: RIGHT/LEFT HEART CATH AND CORONARY/GRAFT ANGIOGRAPHY;  Surgeon: Yvonne Kendall, MD;  Location: [facility];  Service: Cardiovascular;  Laterality: N/A;   TEMPORARY PACEMAKER N/A 04/05/2023   Procedure: TEMPORARY PACEMAKER;  Surgeon: Yvonne Kendall, MD;  Location: [facility] LAB;  Service: Cardiovascular;  Laterality: N/A;   TRANSURETHRAL RESECTION OF BLADDER TUMOR WITH MITOMYCIN-C N/A 12/25/2018   Procedure: TRANSURETHRAL RESECTION OF BLADDER TUMOR WITH Gemcitabine;  Surgeon: Vanna Scotland, MD;  Location: [facility] ORS;  Service: Urology;  Laterality: N/A;     Social History:   reports that he quit smoking about 25 years ago. His smoking use included cigarettes. His smokeless tobacco use includes snuff. He reports that he does not currently use alcohol. He reports that he does not currently use drugs.   Family History:  His family history includes Cervical cancer in his maternal grandmother; Heart failure in his mother; Lung disease in his father; Stroke in his father.   Allergies No Known Allergies   Home Medications  Prior to Admission medications   Medication Sig Start Date End Date Taking? Authorizing Provider  acetaminophen (TYLENOL) 650 MG CR tablet Take 650 mg by mouth every 8 (eight) hours as needed for pain.    [provider]  aspirin EC 81 MG EC tablet Take 1 tablet (81 mg total) by mouth daily. 09/03/17   Gold, Glenice Laine, PA-C  atorvastatin (LIPITOR) 80 MG tablet Take  1 tablet by mouth once daily 02/08/23   Creig Hines, NP  carvedilol (COREG) 12.5 MG tablet TAKE 1 TABLET BY MOUTH TWICE DAILY WITH A MEAL 03/10/23   End, Cristal Deer, MD  lidocaine-prilocaine (EMLA) cream Apply on the port. 30 -45 min  prior to port access. 10/08/22   Earna Coder, MD  losartan (COZAAR) 100 MG tablet Take 1 tablet by mouth once daily 03/10/23   End, Cristal Deer, MD  magic mouthwash (multi-ingredient) oral suspension Swish and spit 5-10 mLs 4 (four) times daily as needed. 11/24/22   Earna Coder, MD  Multiple Vitamin (MULTIVITAMIN WITH MINERALS) TABS tablet Take 1 tablet by mouth daily. One a Day over 50/ lunch    [provider]  nitroGLYCERIN (NITROSTAT) 0.4 MG SL tablet DISSOLVE ONE TABLET UNDER THE TONGUE EVERY 5 MINUTES AS NEEDED FOR CHEST PAIN.  DO NOT EXCEED A TOTAL OF 3 DOSES IN 15 MINUTES 03/24/23   Creig Hines, NP  OLANZapine (ZYPREXA) 5 MG tablet Take one pill at  night. 11/03/22   Earna Coder, MD  ondansetron (ZOFRAN) 8 MG tablet One pill every 8 hours as needed for nausea/vomitting. 10/08/22   Earna Coder, MD  polyethylene glycol (MIRALAX) 17 g packet Take 17 g by mouth daily. 11/07/22   Romeo Apple, Myah A, PA-C  prochlorperazine (COMPAZINE) 10 MG tablet Take 1 tablet (10 mg total) by mouth every 6 (six) hours as needed for nausea or vomiting. 10/08/22   Earna Coder, MD  senna-docusate (SENOKOT-S) 8.6-50 MG tablet Take 1 tablet by mouth daily. 11/07/22   Romeo Apple, Myah A, PA-C  sucralfate (CARAFATE) 1 g tablet Take 1 tablet (1 g total) by mouth 4 (four) times daily -  with meals and at bedtime. Dissolve in 4 tablespoons warm water 30 minutes before meals. 10/26/22   Carmina Miller, MD  sulfamethoxazole-trimethoprim (BACTRIM DS) 800-160 MG tablet Take 1 tablet by mouth 2 (two) times daily for 10 days. 03/29/23 04/08/23  Becky Augusta, NP  torsemide (DEMADEX) 20 MG tablet Take 1 tablet (20 mg total) by mouth  daily as needed (swelling and weight gain). 11/25/22 02/23/23  Creig Hines, NP  traMADol HCl 100 MG TABS Take 100 mg by mouth every 8 (eight) hours as needed. 10/26/22   Earna Coder, MD     Critical care time: 42 minutes      Harlon Ditty, AGACNP-BC  Pulmonary & Critical Care Prefer epic messenger for cross cover needs If after hours, please call E-link

## 2023-04-07 NOTE — Progress Notes (Signed)
*  PRELIMINARY RESULTS* Echocardiogram 2D Echocardiogram has been performed.  Carolyne Fiscal 04/07/2023, 9:49 AM

## 2023-04-07 NOTE — Progress Notes (Addendum)
Advanced Heart Failure Rounding Note  Cardiologist: Yvonne Kendall, MD  Chief Complaint:  Subjective:   LHC 2/11: severe native CAD with patent LIMA-LAD and SVG-OM. SVG-RCA is occluded, though this does not appear acute.   Pressor requirement improving. Now on Epi @ 6, levo @ 9 and vaso @ 0.04  TVP in place.   Swann #s: PA 32/20 (26) CO 4.4 CI 1.94 CVP 15 PAPi 0.8 SVR 1327  Intubated and sedated.   Objective:   Weight Range: 104.9 kg Body mass index is 31.36 kg/m.   Vital Signs:   Temp:  [91.8 F (33.2 C)-98.1 F (36.7 C)] 97.7 F (36.5 C) (02/13 0645) Pulse Rate:  [69-86] 77 (02/13 0645) Resp:  [9-29] 20 (02/13 0645) BP: (113-164)/(52-70) 130/55 (02/13 0500) SpO2:  [92 %-100 %] 100 % (02/13 0645) Arterial Line BP: (92-189)/(43-72) 92/43 (02/13 0645) FiO2 (%):  [40 %-50 %] 40 % (02/13 0400) Last BM Date :  (PTA)  Weight change: Filed Weights   04/05/23 1645  Weight: 104.9 kg    Intake/Output:   Intake/Output Summary (Last 24 hours) at 04/07/2023 0700 Last data filed at 04/07/2023 0400 Gross per 24 hour  Intake 3012.93 ml  Output 885 ml  Net 2127.93 ml    Physical Exam  General:  intubated and sedated HEENT: +ETT +OG Neck: supple. JVD ,difficult to assess cm. Carotids 2+ bilat; no bruits. No lymphadenopathy or thyromegaly appreciated. Swann R IJ Cor: PMI nondisplaced. Regular rate & rhythm. No rubs, gallops or murmurs. Lungs: diminished Abdomen: soft, nontender, nondistended. No hepatosplenomegaly. No bruits or masses. Good bowel sounds. Extremities: no cyanosis, clubbing, rash, edema. R radial  GU: +foley Neuro: sedated  Telemetry   Intt paced 70s (Personally reviewed)    EKG    No new EKG to review  Labs    CBC Recent Labs    04/05/23 1743 04/06/23 0316 04/07/23 0401  WBC 29.0* 34.5* 29.7*  NEUTROABS 25.2*  --   --   HGB 12.8* 13.0 10.2*  HCT 37.3* 38.2* 30.8*  MCV 98.4 97.7 99.0  PLT 277 291 176   Basic Metabolic  Panel Recent Labs    04/06/23 0316 04/06/23 0603 04/06/23 1808 04/07/23 0401  NA 140   < > 135 137  K 7.1*   < > 5.3* 5.3*  CL 105   < > 103 105  CO2 24   < > 24 23  GLUCOSE 162*   < > 165* 168*  BUN 47*   < > 49* 50*  CREATININE 2.78*   < > 2.42* 2.30*  CALCIUM 10.5*   < > 9.4 9.0  MG 2.1  --   --  1.8  PHOS 4.0  --   --  4.4   < > = values in this interval not displayed.   Liver Function Tests Recent Labs    04/05/23 1251 04/05/23 1703  AST 83* 273*  ALT 134* 300*  ALKPHOS 99 124  BILITOT 0.8 0.9  PROT 6.8 5.9*  ALBUMIN 3.5 2.9*   No results for input(s): "LIPASE", "AMYLASE" in the last 72 hours. Cardiac Enzymes No results for input(s): "CKTOTAL", "CKMB", "CKMBINDEX", "TROPONINI" in the last 72 hours.  BNP: BNP (last 3 results) Recent Labs    04/05/23 1743  BNP 169.6*    ProBNP (last 3 results) No results for input(s): "PROBNP" in the last 8760 hours.   D-Dimer Recent Labs    04/05/23 1658  DDIMER 6.28*   Hemoglobin A1C Recent  Labs    04/05/23 1938  HGBA1C 5.9*   Fasting Lipid Panel Recent Labs    04/06/23 0316  TRIG 110   Thyroid Function Tests Recent Labs    04/05/23 1658  TSH 18.053*    Other results:   Imaging    US Venous Img Lower Bilateral (DVT) Result Date: 04/06/2023 CLINICAL DATA:  Bilateral lower extremity edema EXAM: BILATERAL LOWER EXTREMITY VENOUS DOPPLER ULTRASOUND TECHNIQUE: Gray-scale sonography with compression, as well as color and duplex ultrasound, were performed to evaluate the deep venous system(s) from the level of the common femoral vein through the popliteal and proximal calf veins. COMPARISON:  None Available. FINDINGS: VENOUS Normal compressibility of the common femoral, superficial femoral, and popliteal veins, as well as the visualized calf veins. Visualized portions of profunda femoral vein and great saphenous vein unremarkable. No filling defects to suggest DVT on grayscale or color Doppler imaging.  Doppler waveforms show normal direction of venous flow, normal respiratory plasticity and response to augmentation. OTHER None. Limitations: none IMPRESSION: 1. No evidence of deep venous thrombosis within either lower extremity. Electronically Signed   By: Sharlet Salina M.D.   On: 04/06/2023 20:59   US RENAL Result Date: 04/06/2023 CLINICAL DATA:  [id number] Acute kidney injury [facility]) [id number] EXAM: RENAL / URINARY TRACT ULTRASOUND COMPLETE COMPARISON:  09/22/2022 FINDINGS: Right Kidney: Renal measurements: 10.4 x 5.4 x 4.9 cm = volume: 142 mL. Mildly increased renal cortical echogenicity. 7.2 cm cyst arising from the lower pole of the right kidney. No solid mass, shadowing stone, or hydronephrosis visualized. Left Kidney: Renal measurements: 12.3 x 6.1 x 5.5 cm = volume: 215 mL. Mildly increased renal cortical echogenicity. 3.9 cm cyst arising from the lower pole of the left kidney. No solid mass, shadowing stone, or hydronephrosis visualized. Bladder: Decompressed by Foley catheter. Other: None. IMPRESSION: 1. No hydronephrosis. 2. Mildly increased renal cortical echogenicity bilaterally, as can be seen in the setting of medical renal disease. 3. Bilateral renal cysts.  No follow-up imaging recommended. Electronically Signed   By: Duanne Guess D.O.   On: 04/06/2023 13:32   DG Chest Port 1 View Result Date: 04/06/2023 CLINICAL DATA:  Acute respiratory failure EXAM: PORTABLE CHEST 1 VIEW COMPARISON:  04/05/2023 FINDINGS: Check shadow is stable. Postsurgical changes are again noted. Swan-Ganz catheter is noted within the right pulmonary artery. Right chest wall port is seen as well. Endotracheal tube and gastric catheter are stable in appearance. Trans venous pacing catheter is noted from an inferior approach. No focal infiltrate is seen. Previously noted pulmonary edema has nearly completely resolved. IMPRESSION: Near complete resolution of previously seen pulmonary edema. Tubes and lines as described.  Electronically Signed   By: Alcide Clever M.D.   On: 04/06/2023 09:37     Medications:     Scheduled Medications:  aspirin  81 mg Per Tube Daily   atorvastatin  80 mg Per Tube Daily   Chlorhexidine Gluconate Cloth  6 each Topical Daily   docusate  100 mg Per Tube BID   hydrocortisone sod succinate (SOLU-CORTEF) inj  100 mg Intravenous Q12H   insulin aspart  0-6 Units Subcutaneous Q4H   mouth rinse  15 mL Mouth Rinse Q2H   polyethylene glycol  17 g Per Tube Daily   sodium chloride flush  10-40 mL Intracatheter Q12H   sodium zirconium cyclosilicate  10 g Per Tube Q8H    Infusions:  sodium chloride Stopped (04/05/23 1726)   epinephrine 6 mcg/min (04/07/23 0300)   famotidine (PEPCID)  IV Stopped (04/06/23 1841)   fentaNYL infusion INTRAVENOUS 150 mcg/hr (04/07/23 0300)   heparin 900 Units/hr (04/07/23 0300)   norepinephrine (LEVOPHED) Adult infusion 7 mcg/min (04/07/23 0300)   piperacillin-tazobactam (ZOSYN)  IV 3.375 g (04/07/23 0523)   propofol (DIPRIVAN) infusion 35 mcg/kg/min (04/07/23 0300)   vasopressin 0.04 Units/min (04/07/23 0521)    PRN Medications: sodium chloride, acetaminophen, fentaNYL, ipratropium-albuterol, midazolam, ondansetron (ZOFRAN) IV, mouth rinse, sodium chloride flush    Patient Profile  Jakorey Mcconathy is a 78 y.o. 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 AHF team to see post cardiac arrest.   Assessment/Plan  Cardiac arrest / CHB - witnessed cardiac arrest 2/11 while in [place] urgent care waiting room>transferred to [facility] - CPR started immediately by bystander with EMD arrival within 30 seconds - ROSC achieved numerous times but he would keep losing his pulse. Pulse checks showed PEA, asystole and CHB.  - ROSC achieved, now with TVP - LHC 2/11: severe native CAD with patent LIMA-LAD and SVG-OM. SVG-RCA is occluded. Does  not appear acute - Pressor requirement weaning down. On Epi @ 6, levo @ 9 and vaso @ 0.04. Swann #s as above. Titrate levo for MAP >65. - HsTrop 21   Shock - septic/vasoplegic shock - Lactic acid >9 on admission - LA >9>5.5>2.9>3.3>3>3.7>2.8 - Blood cultures pending - UC this admission with no growth - covid (-)   HFimpEF, iCM - 09/2022 Echo: EF 55-60%, mod LVH, GrI DD, nl RV fxn, mildly dil RA, mild MR. - see full echo history above in HPI. (EF 35% pre CABG) - EF ~40% on POCUS post arrest - NYHA IV on admission - CVP 15. Will start with 40 IV lasix x1.  - Order placed for daily weight and strict I&O - will wait to add GDMT when off pressor support - Update echo, ordered 2/11   CAD - Hx nSTEMI 58'  - Now s/p CABG x3 19' - LHC 2/11: severe native CAD with patent LIMA-LAD and SVG-OM. SVG-RCA is occluded. Does not appear acute - Continue ASA/ statin   Alternating BBB - hx of IVCD.  - RBBB on EKG on admission - Now with TVP   HTN - currently on pressor support  7. HLD - LDL 45 2/23 - update lipid panel - Check LFTs today. Consider adding statin once stable  8. Acute respiratory failure - hypoxic on admission - need to r/o PE. Holding off on CTA-PE with elevated SCr - intubated. Management per PCCM - covid (-).  - Continue heparin gtt for possible PE.  9. AKI - SCr 2.32 on admission - Baseline ~1 - Now downtrending. Today 2.3 - avoid hypotension  10 Hyperkalemia - Up to 7.1 post cath 2/11 - K remains elevated. 5.3 today - Lokelma Q8 ordered today.  - BMET later today   Length of Stay: 2  Alen Bleacher, NP  04/07/2023, 7:00 AM  Advanced Heart Failure Team Pager [phone] (M-F; 7a - 5p)  Please contact [facility] for night-coverage after hours (5p -7a ) and weekends on [email or web address]    Agree with above.   Remains intubated and sedated. On NE, Epi and VP.   Rhythm stable without need for backup pacing. Swan and TVP in place. [name] numbers reviewed  personally. And I shot CO numbers my self. CI ~2.4  SBP 130  Scr  stable 2.3. K 5.3  General:  Elderly  intubated sedated HEENT: + ETT Neck: + swan TVP Cor: Regular Lungs: clear Abdomen: soft, nontender, nondistended. No hepatosplenomegaly. No bruits or masses. Good bowel sounds. Extremities: no cyanosis, clubbing, rash, 1+ edema Neuro: sedated  Hemodynamics improved. Rhythm now more stable.   Will wean VP first. Then wean epi.   I placed back-up pacing to 40.   D/w CCM. He had prolonged resuscitation but apparently was following simple commands post-code and end-organ function looks stable. We will wean sedation and assess mental status and readiness for extubation.   Treat hyperK  If recovers will need to decide about PPM  CRITICAL CARE Performed by: Arvilla Meres  Total critical care time: 40 minutes  Critical care time was exclusive of separately billable procedures and treating other patients.  Critical care was necessary to treat or prevent imminent or life-threatening deterioration.  Critical care was time spent personally by me (independent of midlevel providers or residents) on the following activities: development of treatment plan with patient and/or surrogate as well as nursing, discussions with consultants, evaluation of patient's response to treatment, examination of patient, obtaining history from patient or surrogate, ordering and performing treatments and interventions, ordering and review of laboratory studies, ordering and review of radiographic studies, pulse oximetry and re-evaluation of patient's condition.  Arvilla Meres, MD  10:09 AM

## 2023-04-07 NOTE — Progress Notes (Signed)
Daily Progress Note   Patient Name: Cory Hess       Date: 04/07/2023 DOB: 1946-01-22  Age: 78 y.o. MRN#: [id number] Attending Physician: Yvonne Kendall, MD Primary Care Physician: Patient, No Pcp Per Admit Date: 04/05/2023  Reason for Consultation/Follow-up: Establishing goals of care  Subjective: Notes and labs reviewed. In to see patient. He is currently resting in bed on ventilator. No family at bedside.   Called to talk to Homer, and a man answered on her behalf and stated she was unavailable and working at this time. Called to speak with Darlene. She states patient is unmarried and has no children.   She states he had undergone chemo and had been having difficulty with PO intake and was drinking protein shake supplements. He was independent.    She has been kept well updated and was able to articulate his diagnoses and plans.  A discussion was had today regarding advanced directives.  Concepts specific to code status, and ventilator support were dicussed. Values and goals of care important to patient and family were attempted to be elicited.  She states she is unaware of any advanced directives and she and her sister are his surrogate decision makers. She states he has never discussed healthcare, or boundaries to care. She states she is hopeful for continued improvement. Continue full code/ full scope at this time.     Length of Stay: 2  Current Medications: Scheduled Meds:   aspirin  81 mg Per Tube Daily   [START ON 04/09/2023] atorvastatin  80 mg Per Tube Daily   Chlorhexidine Gluconate Cloth  6 each Topical Daily   docusate  100 mg Per Tube BID   [START ON 04/08/2023] feeding supplement (PROSource TF20)  60 mL Per Tube BID   free water  30 mL Per Tube Q4H   hydrocortisone  sod succinate (SOLU-CORTEF) inj  100 mg Intravenous Q12H   insulin aspart  0-6 Units Subcutaneous Q4H   mouth rinse  15 mL Mouth Rinse Q2H   polyethylene glycol  17 g Per Tube Daily   sodium chloride flush  10-40 mL Intracatheter Q12H   sodium zirconium cyclosilicate  10 g Per Tube Q8H    Continuous Infusions:  sodium chloride Stopped (04/05/23 1726)   epinephrine Stopped (04/07/23 1108)   famotidine (PEPCID) IV Stopped (04/06/23 1841)  feeding supplement (VITAL HIGH PROTEIN) 1,000 mL (04/07/23 1453)   fentaNYL infusion INTRAVENOUS 30 mcg/hr (04/07/23 1115)   heparin 900 Units/hr (04/07/23 1447)   norepinephrine (LEVOPHED) Adult infusion 4 mcg/min (04/07/23 1447)   piperacillin-tazobactam (ZOSYN)  IV 12.5 mL/hr at 04/07/23 1447   propofol (DIPRIVAN) infusion 35 mcg/kg/min (04/07/23 1447)   vasopressin Stopped (04/07/23 0958)    PRN Meds: sodium chloride, acetaminophen, fentaNYL, ipratropium-albuterol, midazolam, ondansetron (ZOFRAN) IV, mouth rinse, sodium chloride flush  Physical Exam Constitutional:      Comments: Eyes closed.   Pulmonary:     Comments: On ventilator.             Vital Signs: BP (!) 107/56 (BP Location: Left Arm)   Pulse 90   Temp 97.7 F (36.5 C) (Core)   Resp (!) 25   Ht 6' (1.829 m)   Wt 111.3 kg   SpO2 99%   BMI 33.28 kg/m  SpO2: SpO2: 99 % O2 Device: O2 Device: Ventilator O2 Flow Rate:    Intake/output summary:  Intake/Output Summary (Last 24 hours) at 04/07/2023 1613 Last data filed at 04/07/2023 1447 Gross per 24 hour  Intake 1947.29 ml  Output 1700 ml  Net 247.29 ml   LBM: Last BM Date :  (PTA) Baseline Weight: Weight: 104.9 kg Most recent weight: Weight: 111.3 kg    Patient Active Problem List   Diagnosis Date Noted   Hyperkalemia 04/06/2023   Acute kidney injury (HCC) 04/06/2023   Cardiac arrest (HCC) 04/05/2023   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   Acute on chronic HFrEF (heart failure with reduced ejection  fraction) (HCC) 04/05/2023   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   Hematuria 11/06/2018   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   Coronary artery disease 08/28/2017   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 [facility])     Palliative Care Assessment & Plan     Recommendations/Plan: Continue Full code/ full scope. Time for outcomes.  Sisters are [employer].   PMT provider will follow up Monday on return to service.    Code Status:    Code Status Orders  (From admission, onward)           Start     Ordered   04/05/23 1709  Full code  Continuous       Question:  By:  Answer:  Default: patient does not have capacity for decision making, no surrogate or prior directive available   04/05/23 1708           Code Status History     Date Active Date Inactive Code Status Order ID Comments User Context   10/15/2022 1208 10/16/2022 0518 Full Code [id number]  Pernell Dupre, MD HOV   10/15/2022 1205 10/15/2022 1208 Full Code [id number]  Pernell Dupre, MD HOV   11/06/2018 0153 11/07/2018 1515 Full Code [id number]  Mansy, Vernetta Honey, MD ED   08/28/2017 0213 09/02/2017 2231 Full Code [id number]  Ardelle Balls, PA-C Inpatient       Thank you for allowing the Palliative Medicine Team to assist in the care of this patient.    Morton Stall, NP  Please contact Palliative Medicine Team phone at [phone] for questions and concerns.

## 2023-04-08 ENCOUNTER — Ambulatory Visit: Admission: RE | Admit: 2023-04-08 | Payer: Medicare Other | Source: Ambulatory Visit

## 2023-04-08 ENCOUNTER — Inpatient Hospital Stay: Payer: Medicare Other

## 2023-04-08 ENCOUNTER — Other Ambulatory Visit: Payer: Self-pay

## 2023-04-08 DIAGNOSIS — N179 Acute kidney failure, unspecified: Secondary | ICD-10-CM | POA: Diagnosis not present

## 2023-04-08 DIAGNOSIS — I469 Cardiac arrest, cause unspecified: Secondary | ICD-10-CM | POA: Diagnosis not present

## 2023-04-08 DIAGNOSIS — I639 Cerebral infarction, unspecified: Secondary | ICD-10-CM

## 2023-04-08 DIAGNOSIS — E875 Hyperkalemia: Secondary | ICD-10-CM | POA: Diagnosis not present

## 2023-04-08 DIAGNOSIS — G9341 Metabolic encephalopathy: Secondary | ICD-10-CM | POA: Diagnosis not present

## 2023-04-08 HISTORY — DX: Cerebral infarction, unspecified: I63.9

## 2023-04-08 LAB — GLUCOSE, CAPILLARY
Glucose-Capillary: 105 mg/dL — ABNORMAL HIGH (ref 70–99)
Glucose-Capillary: 106 mg/dL — ABNORMAL HIGH (ref 70–99)
Glucose-Capillary: 74 mg/dL (ref 70–99)
Glucose-Capillary: 75 mg/dL (ref 70–99)
Glucose-Capillary: 91 mg/dL (ref 70–99)
Glucose-Capillary: 91 mg/dL (ref 70–99)

## 2023-04-08 LAB — CBC
HCT: 25.2 % — ABNORMAL LOW (ref 39.0–52.0)
Hemoglobin: 8.5 g/dL — ABNORMAL LOW (ref 13.0–17.0)
MCH: 33.5 pg (ref 26.0–34.0)
MCHC: 33.7 g/dL (ref 30.0–36.0)
MCV: 99.2 fL (ref 80.0–100.0)
Platelets: 132 10*3/uL — ABNORMAL LOW (ref 150–400)
RBC: 2.54 MIL/uL — ABNORMAL LOW (ref 4.22–5.81)
RDW: 11.9 % (ref 11.5–15.5)
WBC: 24 10*3/uL — ABNORMAL HIGH (ref 4.0–10.5)
nRBC: 0 % (ref 0.0–0.2)

## 2023-04-08 LAB — PHOSPHORUS: Phosphorus: 3.5 mg/dL (ref 2.5–4.6)

## 2023-04-08 LAB — BASIC METABOLIC PANEL
Anion gap: 9 (ref 5–15)
Anion gap: 12 (ref 5–15)
BUN: 50 mg/dL — ABNORMAL HIGH (ref 8–23)
BUN: 54 mg/dL — ABNORMAL HIGH (ref 8–23)
CO2: 26 mmol/L (ref 22–32)
CO2: 26 mmol/L (ref 22–32)
Calcium: 8.4 mg/dL — ABNORMAL LOW (ref 8.9–10.3)
Calcium: 8.6 mg/dL — ABNORMAL LOW (ref 8.9–10.3)
Chloride: 106 mmol/L (ref 98–111)
Chloride: 106 mmol/L (ref 98–111)
Creatinine, Ser: 2.17 mg/dL — ABNORMAL HIGH (ref 0.61–1.24)
Creatinine, Ser: 2.03 mg/dL — ABNORMAL HIGH (ref 0.61–1.24)
GFR, Estimated: 31 mL/min — ABNORMAL LOW (ref >60)
GFR, Estimated: 33 mL/min — ABNORMAL LOW (ref >60)
Glucose, Bld: 105 mg/dL — ABNORMAL HIGH (ref 70–99)
Glucose, Bld: 97 mg/dL (ref 70–99)
Potassium: 3.7 mmol/L (ref 3.5–5.1)
Potassium: 3.6 mmol/L (ref 3.5–5.1)
Sodium: 141 mmol/L (ref 135–145)
Sodium: 144 mmol/L (ref 135–145)

## 2023-04-08 LAB — COOXEMETRY PANEL
Carboxyhemoglobin: 2.3 % — ABNORMAL HIGH (ref 0.5–1.5)
Carboxyhemoglobin: 2.7 % — ABNORMAL HIGH (ref 0.5–1.5)
Methemoglobin: 1.4 % (ref 0.0–1.5)
Methemoglobin: 1.7 % — ABNORMAL HIGH (ref 0.0–1.5)
O2 Saturation: 73.2 %
O2 Saturation: 99.7 %
Total hemoglobin: 9.1 g/dL — ABNORMAL LOW (ref 12.0–16.0)
Total hemoglobin: 10 g/dL — ABNORMAL LOW (ref 12.0–16.0)
Total oxygen content: 70.4 %
Total oxygen content: 95.3 %

## 2023-04-08 LAB — MAGNESIUM: Magnesium: 2.2 mg/dL (ref 1.7–2.4)

## 2023-04-08 LAB — HEPARIN LEVEL (UNFRACTIONATED): Heparin Unfractionated: 0.27 [IU]/mL — ABNORMAL LOW (ref 0.30–0.70)

## 2023-04-08 MED ORDER — HEPARIN BOLUS VIA INFUSION
1400.0000 [IU] | Freq: Once | INTRAVENOUS | Status: AC
  Administered 2023-04-08: 1400 [IU] via INTRAVENOUS
  Filled 2023-04-08: qty 1400

## 2023-04-08 MED ORDER — HEPARIN SODIUM (PORCINE) 5000 UNIT/ML IJ SOLN
5000.0000 [IU] | Freq: Three times a day (TID) | INTRAMUSCULAR | Status: DC
  Administered 2023-04-08 – 2023-04-11 (×9): 5000 [IU] via SUBCUTANEOUS
  Filled 2023-04-08 (×9): qty 1

## 2023-04-08 MED ORDER — BISACODYL 10 MG RE SUPP
10.0000 mg | Freq: Once | RECTAL | Status: AC
  Administered 2023-04-08: 10 mg via RECTAL
  Filled 2023-04-08: qty 1

## 2023-04-08 MED ORDER — POTASSIUM CHLORIDE CRYS ER 20 MEQ PO TBCR: 20.0000 meq | EXTENDED_RELEASE_TABLET | Freq: Once | ORAL | Status: DC

## 2023-04-08 MED ORDER — OXYCODONE HCL 5 MG PO TABS
10.0000 mg | ORAL_TABLET | Freq: Four times a day (QID) | ORAL | Status: DC
  Administered 2023-04-08 – 2023-04-09 (×4): 10 mg
  Filled 2023-04-08 (×4): qty 2

## 2023-04-08 MED ORDER — POTASSIUM CHLORIDE 20 MEQ PO PACK
20.0000 meq | PACK | Freq: Once | ORAL | Status: AC
  Administered 2023-04-08: 20 meq
  Filled 2023-04-08: qty 1

## 2023-04-08 MED ORDER — SODIUM CHLORIDE 0.9% FLUSH
10.0000 mL | Freq: Two times a day (BID) | INTRAVENOUS | Status: DC
  Administered 2023-04-08 – 2023-04-21 (×26): 10 mL

## 2023-04-08 MED ORDER — FUROSEMIDE 10 MG/ML IJ SOLN
40.0000 mg | Freq: Two times a day (BID) | INTRAMUSCULAR | Status: AC
  Administered 2023-04-08 (×2): 40 mg via INTRAVENOUS
  Filled 2023-04-08 (×2): qty 4

## 2023-04-08 MED ORDER — SODIUM CHLORIDE 0.9% FLUSH: 10.0000 mL | INTRAVENOUS | Status: DC | PRN

## 2023-04-08 NOTE — Progress Notes (Signed)
Pt transported to and from MRI on transport vent with RN without incident

## 2023-04-08 NOTE — Progress Notes (Addendum)
0730 patient on vent sedated unable to make needs known on vent and TF at this time 0800 wake up assessment started weaning prop 0815 removed temporary pacer removed by cardiology sheath left in place at this time order to remove 0915 wean stopped HE BP and resp all elevated patient not following commands 1217 venous sheath removed by vascular team 1306 Swan removed by cardiology

## 2023-04-08 NOTE — Plan of Care (Signed)
  Problem: Education: Goal: Knowledge of General Education information will improve Description: Including pain rating scale, medication(s)/side effects and non-pharmacologic comfort measures Outcome: Not Progressing   Problem: Health Behavior/Discharge Planning: Goal: Ability to manage health-related needs will improve Outcome: Not Progressing   Problem: Clinical Measurements: Goal: Ability to maintain clinical measurements within normal limits will improve Outcome: Not Progressing Goal: Will remain free from infection Outcome: Not Progressing Goal: Diagnostic test results will improve Outcome: Not Progressing Goal: Respiratory complications will improve Outcome: Not Progressing Goal: Cardiovascular complication will be avoided Outcome: Not Progressing   Problem: Activity: Goal: Risk for activity intolerance will decrease Outcome: Not Progressing   Problem: Nutrition: Goal: Adequate nutrition will be maintained Outcome: Not Progressing   Problem: Coping: Goal: Level of anxiety will decrease Outcome: Not Progressing   Problem: Elimination: Goal: Will not experience complications related to bowel motility Outcome: Not Progressing Goal: Will not experience complications related to urinary retention Outcome: Not Progressing   Problem: Pain Managment: Goal: General experience of comfort will improve and/or be controlled Outcome: Not Progressing   Problem: Safety: Goal: Ability to remain free from injury will improve Outcome: Not Progressing   Problem: Skin Integrity: Goal: Risk for impaired skin integrity will decrease Outcome: Not Progressing   Problem: Education: Goal: Understanding of CV disease, CV risk reduction, and recovery process will improve Outcome: Not Progressing   Problem: Activity: Goal: Ability to return to baseline activity level will improve Outcome: Not Progressing   Problem: Cardiovascular: Goal: Ability to achieve and maintain adequate  cardiovascular perfusion will improve Outcome: Not Progressing Goal: Vascular access site(s) Level 0-1 will be maintained Outcome: Not Progressing   Problem: Health Behavior/Discharge Planning: Goal: Ability to safely manage health-related needs after discharge will improve Outcome: Not Progressing   Problem: Education: Goal: Understanding of CV disease, CV risk reduction, and recovery process will improve Outcome: Not Progressing   Problem: Activity: Goal: Ability to return to baseline activity level will improve Outcome: Not Progressing   Problem: Cardiovascular: Goal: Ability to achieve and maintain adequate cardiovascular perfusion will improve Outcome: Not Progressing Goal: Vascular access site(s) Level 0-1 will be maintained Outcome: Not Progressing   Problem: Health Behavior/Discharge Planning: Goal: Ability to safely manage health-related needs after discharge will improve Outcome: Not Progressing   Problem: Education: Goal: Ability to describe self-care measures that may prevent or decrease complications (Diabetes Survival Skills Education) will improve Outcome: Not Progressing   Problem: Coping: Goal: Ability to adjust to condition or change in health will improve Outcome: Not Progressing   Problem: Fluid Volume: Goal: Ability to maintain a balanced intake and output will improve Outcome: Not Progressing   Problem: Health Behavior/Discharge Planning: Goal: Ability to identify and utilize available resources and services will improve Outcome: Not Progressing Goal: Ability to manage health-related needs will improve Outcome: Not Progressing   Problem: Metabolic: Goal: Ability to maintain appropriate glucose levels will improve Outcome: Not Progressing   Problem: Nutritional: Goal: Maintenance of adequate nutrition will improve Outcome: Not Progressing Goal: Progress toward achieving an optimal weight will improve Outcome: Not Progressing   Problem: Skin  Integrity: Goal: Risk for impaired skin integrity will decrease Outcome: Not Progressing   Problem: Tissue Perfusion: Goal: Adequacy of tissue perfusion will improve Outcome: Not Progressing

## 2023-04-08 NOTE — Progress Notes (Addendum)
PHARMACY CONSULT NOTE - FOLLOW UP  Pharmacy Consult for Electrolyte Monitoring and Replacement   Recent Labs: Potassium (mmol/L)  Date Value  04/08/2023 3.7   Magnesium (mg/dL)  Date Value  87/56/4332 2.2   Calcium (mg/dL)  Date Value  95/18/8416 8.4 (L)   Albumin (g/dL)  Date Value  60/63/0160 2.6 (L)  04/01/2021 4.2   Phosphorus (mg/dL)  Date Value  10/93/2355 3.5   Sodium (mmol/L)  Date Value  04/08/2023 141  10/05/2022 141   Assessment: DC is a 78 yo male who presented to [facility] ED post cardiac arrest. Their downtime was 2 hours. Pharmacy has been consult to manage this patient's electrolytes while in the ICU.   Fluids: Norepi + propofol + vaso Nutrition: Vital high protein 27mL/hr + free water 30 mL Q4H Pertinent Medications: Furosemide 40 mg BID  Goal of Therapy:  Electrolytes WNL (K > 4; Mg > 2) K = 3.7 Mg = 2.2  Phos = 3.5  Plan:  Replace K with 20 mEq PO x 1 (cautious due to renal function) No other replacement indicated at this time Check BMP with AM labs   Effie Shy, PharmD Pharmacy Resident  04/08/2023 8:26 AM

## 2023-04-08 NOTE — Progress Notes (Signed)
Right femoral venous sheath removed.  No bleeding/hematoma.

## 2023-04-08 NOTE — Progress Notes (Signed)
Advanced Heart Failure Rounding Note  Cardiologist: Yvonne Kendall, MD  Chief Complaint:  Subjective:   LHC 2/11: severe native CAD with patent LIMA-LAD and SVG-OM. SVG-RCA is occluded, though this does not appear acute.   Echo EF ~40-45%  Remains intubated/sedated. Now off pressors.   ICU team attempted to wean sedation yesterday but patient got agitated and had to be re-sedated. Did not follow commands when sedation lightened.   Cory Hess numbers reviewed personally and CO is high ou pressors. Co-ox 73% CVP 9 PAP: (20-41)/(13-28) 25/20 CVP:  [5 mmHg-17 mmHg] 8 mmHg CO:  [7 L/min-7.1 L/min] 7 L/min CI:  [3.09 L/min/m2-3.12 L/min/m2] 3.09 L/min/m2   TVP remains in place. No pacing overnight with back up rate at 40   Objective:   Weight Range: 106.9 kg Body mass index is 31.96 kg/m.   Vital Signs:   Temp:  [97.2 F (36.2 C)-99.3 F (37.4 C)] 97.2 F (36.2 C) (02/14 1615) Pulse Rate:  [65-116] 85 (02/14 1700) Resp:  [0-29] 26 (02/14 1700) BP: (104-121)/(50-66) 120/64 (02/14 1200) SpO2:  [93 %-100 %] 93 % (02/14 1700) Arterial Line BP: (103-193)/(39-63) 131/45 (02/14 1700) FiO2 (%):  [30 %-35 %] 30 % (02/14 1502) Weight:  [106.9 kg] 106.9 kg (02/14 0320) Last BM Date :  (PTA)  Weight change: Filed Weights   04/05/23 1645 04/07/23 0745 04/08/23 0320  Weight: 104.9 kg 111.3 kg 106.9 kg    Intake/Output:   Intake/Output Summary (Last 24 hours) at 04/08/2023 1711 Last data filed at 04/08/2023 1700 Gross per 24 hour  Intake 1981.97 ml  Output 3350 ml  Net -1368.03 ml    Physical Exam   General:  Intubated/sedated HEENT: + ETT/OG Neck: supple.RIJ swan Cor: Regular rate & rhythm. No rubs, gallops or murmurs. Lungs: clear Abdomen: soft, nontender, nondistended. No hepatosplenomegaly. No bruits or masses. Good bowel sounds. Extremities: no cyanosis, clubbing, rash,1-2+  edema +RFV TVP Neuro: sedated. Unresponsive   Telemetry   Sinus 65-75 No pacing.  Personally reviewed   Labs    CBC Recent Labs    04/05/23 1743 04/06/23 0316 04/07/23 0401 04/08/23 0412  WBC 29.0*   < > 29.7* 24.0*  NEUTROABS 25.2*  --   --   --   HGB 12.8*   < > 10.2* 8.5*  HCT 37.3*   < > 30.8* 25.2*  MCV 98.4   < > 99.0 99.2  PLT 277   < > 176 132*   < > = values in this interval not displayed.   Basic Metabolic Panel Recent Labs    16/10/96 0401 04/07/23 1802 04/08/23 0412  NA 137 140 141  K 5.3* 4.2 3.7  CL 105 105 106  CO2 23 26 26   GLUCOSE 168* 87 105*  BUN 50* 50* 50*  CREATININE 2.30* 2.28* 2.17*  CALCIUM 9.0 8.7* 8.4*  MG 1.8  --  2.2  PHOS 4.4  --  3.5   Liver Function Tests Recent Labs    04/07/23 0758  AST 83*  ALT 146*  ALKPHOS 80  BILITOT 0.7  PROT 5.5*  ALBUMIN 2.6*   No results for input(s): "LIPASE", "AMYLASE" in the last 72 hours. Cardiac Enzymes No results for input(s): "CKTOTAL", "CKMB", "CKMBINDEX", "TROPONINI" in the last 72 hours.  BNP: BNP (last 3 results) Recent Labs    04/05/23 1743  BNP 169.6*    ProBNP (last 3 results) No results for input(s): "PROBNP" in the last 8760 hours.   D-Dimer No results for  input(s): "DDIMER" in the last 72 hours.  Hemoglobin A1C Recent Labs    04/05/23 1938  HGBA1C 5.9*   Fasting Lipid Panel Recent Labs    04/07/23 0758  CHOL 69  HDL 21*  LDLCALC 25  TRIG 161  CHOLHDL 3.3   Thyroid Function Tests Recent Labs    04/05/23 2035  T3FREE 2.2    Other results:   Imaging    [place] EKG SITE RITE Result Date: 04/08/2023 If Site Rite image not attached, placement could not be confirmed due to current cardiac rhythm.    Medications:     Scheduled Medications:  aspirin  81 mg Per Tube Daily   [START ON 04/09/2023] atorvastatin  80 mg Per Tube Daily   Chlorhexidine Gluconate Cloth  6 each Topical Daily   docusate  100 mg Per Tube BID   feeding supplement (PROSource TF20)  60 mL Per Tube BID   free water  30 mL Per Tube Q4H   furosemide  40 mg  Intravenous BID   heparin injection (subcutaneous)  5,000 Units Subcutaneous Q8H   hydrocortisone sod succinate (SOLU-CORTEF) inj  100 mg Intravenous Q12H   insulin aspart  0-6 Units Subcutaneous Q4H   mouth rinse  15 mL Mouth Rinse Q2H   oxyCODONE  10 mg Per Tube Q6H   polyethylene glycol  17 g Per Tube Daily   sodium chloride flush  10-40 mL Intracatheter Q12H   sodium chloride flush  10-40 mL Intracatheter Q12H    Infusions:  sodium chloride 10 mL/hr at 04/08/23 1700   epinephrine Stopped (04/07/23 1108)   famotidine (PEPCID) IV Stopped (04/07/23 1900)   feeding supplement (VITAL HIGH PROTEIN) 50 mL/hr at 04/08/23 1700   fentaNYL infusion INTRAVENOUS 50 mcg/hr (04/08/23 1700)   norepinephrine (LEVOPHED) Adult infusion 2 mcg/min (04/08/23 1700)   piperacillin-tazobactam (ZOSYN)  IV Stopped (04/08/23 1518)   propofol (DIPRIVAN) infusion 40 mcg/kg/min (04/08/23 1700)   vasopressin Stopped (04/07/23 0958)    PRN Medications: sodium chloride, acetaminophen, fentaNYL, ipratropium-albuterol, midazolam, ondansetron (ZOFRAN) IV, mouth rinse, sodium chloride flush, sodium chloride flush    Patient Profile  Cory Hess is a 78 y.o. 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 AHF team to see post cardiac arrest.   Assessment/Plan   1. Cardiac arrest / CHB - witnessed cardiac arrest 2/11 while in [place] urgent care waiting room>transferred to [facility] - CPR started immediately by bystander with EMD arrival within 30 seconds - ROSC achieved numerous times but he would keep losing his pulse. Pulse checks showed PEA, asystole and CHB.  - ROSC achieved -> TVP - LHC 2/11: severe native CAD with patent LIMA-LAD and SVG-OM. SVG-RCA is occluded. Does not appear acute - Etiology felt to be PEA/hyperkalemia/brady - Now off pressors  - Sedation wean on 2/13 ->  agitated but no purposeful interaction. ? Anoxic injury - D/w Primary -> will remove swan and TVP and plan for brain MRI   Shock - Mixed picture of cardiogenic/vasoplegic/septic - Lactic acid >9 on admission - LA >9>5.5>2.9>3.3>3>3.7>2.8 - Blood cultures negative - Output now high off pressors.  - Continue broad spectrum abx per primary team - Can pull swan. Will place PICC to follow co-ox/CVP   HFimpEF, iCM - 09/2022 Echo: EF 55-60%, mod LVH, GrI DD, nl RV fxn, mildly dil RA, mild MR. - see full  echo history above in HPI. (EF 35% pre CABG) [facility]-45[facility] - NYHA IV on admission - CVP 9-10 - Continue lasix 40IV bid today. Reassess tomorrow - start GDMT as tolerated  CAD - Hx nSTEMI 19'  - Now s/p CABG x3 32' - LHC 2/11: severe native CAD with patent LIMA-LAD and SVG-OM. SVG-RCA is occluded. Does not appear acute - Continue ASA/ statin - No s/s of ongoing angina   Alternating BBB - hx of IVCD.  - RBBB on EKG on admission - Can pull TVP today - if recovers will need to watch closely on tele for nee for possible PPM but unlikely at this point  6. Acute respiratory failure - hypoxic on admission - CCM has patient on heparin for possible PE.  7. AKI - SCr 2.32 on admission - Baseline ~1 - Now downtrending. Today 2.3 -> 2.17 - avoid hypotension  8. Hyperkalemia - Up to 7.1 post cath 2/11 - K down to 3.7 today   Hemodynamically more stable. Now off pressors with high cardiac output. Watch for septic/distrubitive shock. Will pull swan and TVP. Plan brain MRI to assess for anoxic injury.   Lasix 40 IV bid today. Start GDMT as tolerated.   D/w CCM personally.   CRITICAL CARE Performed by: Arvilla Meres  Total critical care time: 45 minutes  Critical care time was exclusive of separately billable procedures and treating other patients.  Critical care was necessary to treat or prevent imminent or life-threatening deterioration.  Critical care was time  spent personally by me (independent of midlevel providers or residents) on the following activities: development of treatment plan with patient and/or surrogate as well as nursing, discussions with consultants, evaluation of patient's response to treatment, examination of patient, obtaining history from patient or surrogate, ordering and performing treatments and interventions, ordering and review of laboratory studies, ordering and review of radiographic studies, pulse oximetry and re-evaluation of patient's condition.    Length of Stay: 3  Arvilla Meres, MD  04/08/2023, 5:11 PM  Advanced Heart Failure Team Pager [phone] (M-F; 7a - 5p)  Please contact [facility] for night-coverage after hours (5p -7a ) and weekends on [email or web address]

## 2023-04-08 NOTE — Progress Notes (Signed)
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  He presented to [facility] ER via EMS from [facility] following a witnessed cardiac arrest.  Bystander CPR initiated and EMS arrived on the scene within 30 seconds cardiac rhythm asystole.  ROSC initially achieved, however pt in and out of PEA/asystole.    Pt recently evaluated in the ER on 02/4 with urinary urgency and frequency that started 5 days prior to that presentation.  Diagnosed with a UTI and prescribed Bactrim DS twice daily for 10 days.  However, symptoms persisted prompting [facility] visit today.  ED Course  Upon arrival to the ER pt on NRB with pulse.  It was reported during chest compressions the pt began to fight and pulling at medical devices.  While in the the ER waiting room the pt cardiac arrested again requiring ACLS protocol and mechanical intubation.  Pt with varied cardiac rhythm during cardiac arrest PEA/asystole/complete heart block.  He required transcutaneously pacing.  EKG showed apparent sinus rhythm with complete heart block and ventricular escape rhythm with LBBB morphology.  Total downtime estimated 2hrs from out of hospital cardiac arrest and inpatient cardiac arrest.  Pt required epinephrine gtt due to bradycardia and hypotension.  Pt taken emergently to the cardiac cath lab for temporary transvenous pacemaker placement, emergent cardiac catheterization, and swan-ganz catheter placement.   Cardiac cath revealed severe native CAD with patent LIMA-LAD and SVG-OM. SVG-RCA is occluded, but did not appear  acute and unlikely the inciting factor for today's cardiac arrest.  Per cardiology if pt makes a meaningful recovery he may require revascularization of the RCA.  Pt admitted to the ICU post cardiac cath PCCM consulted to asume care.    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  Worsening AKI and mild hyperkalemia, given shifting measures.  Low threshold for Nephrology consultation. 02/13: Weaning down vasopressors, Vasopressin off, Levo @ 7, Epi @ 6.  On minimal vent support.  Renal function and hyperkalemia slowly improving.  Advanced Heart Failure diuresing with 40 mg IV Lasix x1 dose for CVP of 15.  Perform WUA. 02/14: Weaned off vasopressors  On minimal vent support, failed WUA due to severe hypertension and increased WOB.  On WUA only opening eyes, not following commands, will obtain MRI Brain.  Theone Murdoch, temporary pacer and right femoral venous sheath removed.  AKI slowly improving.  Heparin gtt discontinued given low suspicion for PE.  To have PICC placed per Heart Failure team request.  Interim History / Subjective:  As outlined above under Significant Hospital Events section   Micro Data:  2/11: COVID-19 PCR>> negative  2/11: MRSA PCR>> negative 2/11: Urine>> no growth 2/12: Blood cultures x2>> no growth to date  Antimicrobials:   Anti-infectives (From admission, onward)    Start     Dose/Rate Route Frequency  Ordered Stop   04/06/23 1100  vancomycin (VANCOREADY) IVPB 2000 mg/400 mL        2,000 mg 200 mL/hr over 120 Minutes Intravenous  Once 04/06/23 1006 04/06/23 1253   04/06/23 1100  piperacillin-tazobactam (ZOSYN) IVPB 3.375 g        3.375 g 12.5 mL/hr over 240 Minutes Intravenous Every 8 hours 04/06/23 1006     04/05/23 1830  cefTRIAXone (ROCEPHIN) 2 g in sodium chloride 0.9 % 100 mL IVPB  Status:  Discontinued        2 g 200 mL/hr over 30 Minutes Intravenous Every 24 hours 04/05/23 1736 04/06/23 0827      Objective   Blood pressure (!) 104/50, pulse 85, temperature 97.7 F (36.5 C), resp. rate 19, height 6' (1.829 m), weight 106.9 kg, SpO2 100%. PAP: (22-41)/(13-23) 31/21 CVP:  [8 mmHg-18 mmHg] 9 mmHg CO:  [5.6 L/min-7.1 L/min] 7 L/min CI:  [2.45 L/min/m2-3.12 L/min/m2] 3.09 L/min/m2  Vent Mode: PRVC FiO2 (%):  [35 %] 35 % Set Rate:  [26 bmp] 26 bmp Vt Set:  [450 mL] 450 mL PEEP:  [5 cmH20-10 cmH20] 5 cmH20 Plateau Pressure:  [14 cmH20-18 cmH20] 18 cmH20   Intake/Output Summary (Last 24 hours) at 04/08/2023 0750 Last data filed at 04/08/2023 0400 Gross per 24 hour  Intake 1756.42 ml  Output 4150 ml  Net -2393.58 ml   Filed Weights   04/05/23 1645 04/07/23 0745 04/08/23 0320  Weight: 104.9 kg 111.3 kg 106.9 kg    Examination: General: Acute on chronically-ill appearing male, NAD mechanically intubated  HENT: Supple, mild JVD  Lungs: Faint rhonchi throughout, even, non labored, overbreathing the vent Cardiovascular: RRR, no m/r/g, 2+ radial/1+ distal pulses, bilateral extremities cool to touch  Abdomen: +BS x4, obese, soft non distended  Extremities: Normal bulk and tone, no deformities, mild mottling Neuro: Sedated, currently not following withdrawing from pain or following commands, PERRL, + cough/gag reflexes GU: Indwelling foley catheter draining yellow urine   Resolved Hospital Problem list     Assessment & Plan:   #Acute metabolic encephalopathy  #Concern for  possible anoxic brain injury #Mechanical intubation pain/discomfort  -Maintain a RASS goal of 0 to -1 -Fentanyl and Propofol as needed to maintain RASS goal -Avoid sedating medications as able -Daily wake up assessment -Obtain MRI Brain -Low threshold for Neurology consult for prognostication  #Cardiac arrest  #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 SVG-RCA is occluded, but did not appear acute and unlikely the inciting factor for today's cardiac arrest Echocardiogram 04/07/23:  LVEF 40-45%, mild LVH, Grade I DD, RV systolic function not well visualized, RV size normal -Continuous cardiac monitoring -Maintain MAP >65 -Vasopressors as needed to maintain MAP goal ~ weaned off -Lactic acid has normalized -Trend HS Troponin until peaked -Trend Coox panel -Diuresis as BP and renal function permits ~ 40 mg IV Lasix x1 dose on 2/14 -Cardiology following, appreciate input: recommending if pt has a meaningful recovery may require revascularization of the RCA and  if junctional rhythm persist start amiodarone gtt -Heparin gtt discontinued 2/14 -Advanced heart failure team following along -GDMT on hold for now  -Thyroid  elevated at 18, but free T4 normal at 0.93  #Acute hypoxic hypercapnic respiratory failure  #Possible PE ~ low suspicion  #Aspiration pneumonia secondary to acute encephalopathy post cardiac arrest  #Mechanical intubation  -Full vent support, implement lung protective strategies -Plateau pressures less than 30 cm H20 -Wean FiO2 & PEEP as tolerated to maintain O2 sats >92% -Follow intermittent Chest X-ray & ABG as needed -Spontaneous Breathing Trials when respiratory parameters met and mental status permits -Implement VAP Bundle -Prn Bronchodilators -ABX as above -Heparin gtt discontinued  2/14 -Unable to perform CTA PE Chest due to acute kidney injury consider lung VQ scan once stabilized to travel ~ Venous [place] of Bilateral LE's negative for DVT, Echo with no evidence of RV strain  #Acute kidney injury secondary to ATN ~ improving #Hyperkalemia ~ improving #Anion gap metabolic acidosis  #Lactic Acidosis -Monitor I&O's / urinary output -Follow BMP -Ensure adequate renal perfusion -Avoid nephrotoxic agents as able -Replace electrolytes as indicated ~ Pharmacy following for assistance with electrolyte replacement -Continue shifting measures: Lokelma ~ Repeat BMP later this afternoon -Low threshold for Nephrology consultation  #UTI  #Aspiration pneumonia  -Monitor fever curve -Trend WBC's & Procalcitonin -Follow cultures as above -Broaden ABX to empiric Vancomycin and Zoysn pending cultures & sensitivities given critical illness  #Transaminitis  - Trend hepatic function panel  - Avoid hepatotoxic medications as able   #Hyperglycemia  - Hemoglobin A1c pending  - CBG's q4hrs  - Very sensitive SSI  - Follow hyper/hypoglycemic protocol  - Target CBG readings 140 to 180    Best Practice (right click and "Reselect all SmartList Selections" daily)   Diet/type: tube feeds DVT prophylaxis SQ Heparin Pressure ulcer(s): N/A GI prophylaxis: H2B Lines: Swan-ganz catheter and still needed; portacath present on admission  Foley:  Yes, and it is still needed Code Status:  full code Last date of multidisciplinary goals of care discussion [2/14]  2/14: Updated pt's sister at bedside on plan of care.  Labs   CBC: Recent Labs  Lab 04/05/23 1251 04/05/23 1423 04/05/23 1459 04/05/23 1743 04/06/23 0316 04/07/23 0401 04/08/23 0412  WBC 13.7*  --   --  29.0* 34.5* 29.7* 24.0*  NEUTROABS  --   --   --  25.2*  --   --   --   HGB 12.5*   < > 10.9* 12.8* 13.0 10.2* 8.5*  HCT 40.8   < > 32.0* 37.3* 38.2* 30.8* 25.2*  MCV 108.8*  --   --  98.4 97.7 99.0 99.2  PLT 306  --    --  277 291 176 132*   < > = values in this interval not displayed.    Basic Metabolic Panel: Recent Labs  Lab 04/05/23 1658 04/05/23 1703 04/05/23 1813 04/05/23 2226 04/06/23 0316 04/06/23 0603 04/06/23 1042 04/06/23 1808 04/07/23 0401 04/07/23 1802 04/08/23 0412  NA  --    < >  --    < > 140   < > 137 135 137 140 141  K  --    < >  --    < > 7.1*   < > 5.3* 5.3* 5.3* 4.2 3.7  CL  --    < >  --    < > 105   < > 105 103 105 105 106  CO2  --    < >  --    < > 24   < > 22 24 23 26 26   GLUCOSE  --    < >  --    < >  162*  --  225* 165* 168* 87 105*  BUN  --    < >  --    < > 47*  --  48* 49* 50* 50* 50*  CREATININE  --    < >  --    < > 2.78*  --  2.63* 2.42* 2.30* 2.28* 2.17*  CALCIUM  --    < >  --    < > 10.5*  --  9.9 9.4 9.0 8.7* 8.4*  MG 2.5*  --   --   --  2.1  --   --   --  1.8  --  2.2  PHOS  --   --  6.7*  --  4.0  --   --   --  4.4  --  3.5   < > = values in this interval not displayed.   GFR: Estimated Creatinine Clearance: 36.6 mL/min (A) (by C-G formula based on SCr of 2.17 mg/dL (H)). Recent Labs  Lab 04/05/23 1658 04/05/23 1743 04/05/23 2035 04/06/23 0316 04/06/23 1042 04/06/23 1408 04/07/23 0401 04/07/23 0758 04/08/23 0412  PROCALCITON 0.28  --   --  7.41  --   --   --   --   --   WBC  --  29.0*  --  34.5*  --   --  29.7*  --  24.0*  LATICACIDVEN 2.9* 2.9*   < > 3.0* 3.7* 2.8*  --  1.5  --    < > = values in this interval not displayed.    Liver Function Tests: Recent Labs  Lab 04/05/23 1251 04/05/23 1703 04/07/23 0758  AST 83* 273* 83*  ALT 134* 300* 146*  ALKPHOS 99 124 80  BILITOT 0.8 0.9 0.7  PROT 6.8 5.9* 5.5*  ALBUMIN 3.5 2.9* 2.6*   No results for input(s): "LIPASE", "AMYLASE" in the last 168 hours. No results for input(s): "AMMONIA" in the last 168 hours.  ABG    Component Value Date/Time   PHART 7.35 04/07/2023 0500   PCO2ART 44 04/07/2023 0500   PO2ART 103 04/07/2023 0500   HCO3 24.3 04/07/2023 0500   TCO2 33 (H)  04/05/2023 1459   ACIDBASEDEF 1.5 04/07/2023 0500   O2SAT 73.2 04/08/2023 0600     Coagulation Profile: Recent Labs  Lab 04/05/23 1703  INR 1.4*    Cardiac Enzymes: No results for input(s): "CKTOTAL", "CKMB", "CKMBINDEX", "TROPONINI" in the last 168 hours.  HbA1C: Hgb A1c MFr Bld  Date/Time Value Ref Range Status  04/05/2023 07:38 PM 5.9 (H) 4.8 - 5.6 % Final    Comment:    (NOTE) Pre diabetes:          5.7%-6.4%  Diabetes:              >6.4%  Glycemic control for   <7.0% adults with diabetes   08/28/2017 02:27 AM 5.6 4.8 - 5.6 % Final    Comment:    (NOTE) Pre diabetes:          5.7%-6.4% Diabetes:              >6.4% Glycemic control for   <7.0% adults with diabetes     CBG: Recent Labs  Lab 04/07/23 1615 04/07/23 1909 04/07/23 2310 04/08/23 0318 04/08/23 0736  GLUCAP 72 75 94 91 106*    Review of Systems:   Unable to assess pt mechanically intubated   Past Medical History:  He,  has a past medical history of Alternating RBBB &  LBBB, Ankle swelling (2024), Arthritis, Bladder tumor, CAD (coronary artery disease), Cancer (HCC), Cancer of base of tongue (HCC), Essential hypertension, 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   Surgical History:   Past Surgical History:  Procedure Laterality Date   CARDIAC CATHETERIZATION     CATARACT EXTRACTION W/PHACO Left 07/18/2018   Procedure: CATARACT EXTRACTION PHACO AND INTRAOCULAR LENS PLACEMENT (IOC)  LEFT;  Surgeon: Nevada Crane, MD;  Location: [facility];  Service: Ophthalmology;  Laterality: Left;   CATARACT EXTRACTION W/PHACO Right 08/14/2018   Procedure: CATARACT EXTRACTION PHACO AND INTRAOCULAR LENS PLACEMENT (IOC)  RIGHT;  Surgeon: Nevada Crane, MD;  Location: [facility];  Service: Ophthalmology;  Laterality: Right;   CORONARY ARTERY BYPASS  GRAFT N/A 08/27/2017   Procedure: CORONARY ARTERY BYPASS GRAFTING (CABG) ON PUMP USING LEFT INTERNAL MAMMARY ARTERY AND LEFT GREATER SAPHENOUS VEIN VIA ENDOVEIN HARVEST;  Surgeon: Alleen Borne, MD;  Location: [facility] OR;  Service: Open Heart Surgery;  Laterality: N/A;   CORONARY/GRAFT ACUTE MI REVASCULARIZATION N/A 08/27/2017   Procedure: Coronary/Graft Acute MI Revascularization;  Surgeon: Iran Ouch, MD;  Location: [facility];  Service: Cardiovascular;  Laterality: N/A;   CYSTOSCOPY W/ RETROGRADES Bilateral 12/25/2018   Procedure: CYSTOSCOPY WITH RETROGRADE PYELOGRAM;  Surgeon: Vanna Scotland, MD;  Location: [facility];  Service: Urology;  Laterality: Bilateral;   FRACTURE SURGERY Right 1958   arm and left wrist compound fracture, no metal   HERNIA REPAIR Right    inguinial   IR IMAGING GUIDED PORT INSERTION  10/15/2022   LEFT HEART CATH AND CORONARY ANGIOGRAPHY N/A 08/27/2017   Procedure: LEFT HEART CATH AND CORONARY ANGIOGRAPHY;  Surgeon: Iran Ouch, MD;  Location: [facility];  Service: Cardiovascular;  Laterality: N/A;   RIGHT/LEFT HEART CATH AND CORONARY/GRAFT ANGIOGRAPHY N/A 04/05/2023   Procedure: RIGHT/LEFT HEART CATH AND CORONARY/GRAFT ANGIOGRAPHY;  Surgeon: Yvonne Kendall, MD;  Location: [facility];  Service: Cardiovascular;  Laterality: N/A;   TEMPORARY PACEMAKER N/A 04/05/2023   Procedure: TEMPORARY PACEMAKER;  Surgeon: Yvonne Kendall, MD;  Location: [facility];  Service: Cardiovascular;  Laterality: N/A;   TRANSURETHRAL RESECTION OF BLADDER TUMOR WITH MITOMYCIN-C N/A 12/25/2018   Procedure: TRANSURETHRAL RESECTION OF BLADDER TUMOR WITH Gemcitabine;  Surgeon: Vanna Scotland, MD;  Location: [facility];  Service: Urology;  Laterality: N/A;     Social History:   reports that he quit smoking about 25 years ago. His smoking use included cigarettes. His smokeless tobacco use includes snuff. He reports that he does not currently use alcohol. He  reports that he does not currently use drugs.   Family History:  His family history includes Cervical cancer in his maternal grandmother; Heart failure in his mother; Lung disease in his father; Stroke in his father.   Allergies No Known Allergies   Home Medications  Prior to Admission medications   Medication Sig Start Date End Date Taking? Authorizing Provider  acetaminophen (TYLENOL) 650 MG CR tablet Take 650 mg by mouth every 8 (eight) hours as needed for pain.    [provider]  aspirin EC 81 MG EC tablet Take 1 tablet (81 mg total) by mouth daily. 09/03/17   Gold, Glenice Laine, PA-C  atorvastatin (LIPITOR) 80 MG tablet Take 1 tablet by mouth once daily 02/08/23   Creig Hines, NP  carvedilol (COREG) 12.5 MG tablet TAKE 1 TABLET BY MOUTH  TWICE DAILY WITH A MEAL 03/10/23   End, Cristal Deer, MD  lidocaine-prilocaine (EMLA) cream Apply on the port. 30 -45 min  prior to port access. 10/08/22   Earna Coder, MD  losartan (COZAAR) 100 MG tablet Take 1 tablet by mouth once daily 03/10/23   End, Cristal Deer, MD  magic mouthwash (multi-ingredient) oral suspension Swish and spit 5-10 mLs 4 (four) times daily as needed. 11/24/22   Earna Coder, MD  Multiple Vitamin (MULTIVITAMIN WITH MINERALS) TABS tablet Take 1 tablet by mouth daily. One a Day over 50/ lunch    [provider]  nitroGLYCERIN (NITROSTAT) 0.4 MG SL tablet DISSOLVE ONE TABLET UNDER THE TONGUE EVERY 5 MINUTES AS NEEDED FOR CHEST PAIN.  DO NOT EXCEED A TOTAL OF 3 DOSES IN 15 MINUTES 03/24/23   Creig Hines, NP  OLANZapine (ZYPREXA) 5 MG tablet Take one pill at night. 11/03/22   Earna Coder, MD  ondansetron (ZOFRAN) 8 MG tablet One pill every 8 hours as needed for nausea/vomitting. 10/08/22   Earna Coder, MD  polyethylene glycol (MIRALAX) 17 g packet Take 17 g by mouth daily. 11/07/22   Romeo Apple, Myah A, PA-C  prochlorperazine (COMPAZINE) 10 MG tablet Take 1 tablet (10  mg total) by mouth every 6 (six) hours as needed for nausea or vomiting. 10/08/22   Earna Coder, MD  senna-docusate (SENOKOT-S) 8.6-50 MG tablet Take 1 tablet by mouth daily. 11/07/22   Romeo Apple, Myah A, PA-C  sucralfate (CARAFATE) 1 g tablet Take 1 tablet (1 g total) by mouth 4 (four) times daily -  with meals and at bedtime. Dissolve in 4 tablespoons warm water 30 minutes before meals. 10/26/22   Carmina Miller, MD  sulfamethoxazole-trimethoprim (BACTRIM DS) 800-160 MG tablet Take 1 tablet by mouth 2 (two) times daily for 10 days. 03/29/23 04/08/23  Becky Augusta, NP  torsemide (DEMADEX) 20 MG tablet Take 1 tablet (20 mg total) by mouth daily as needed (swelling and weight gain). 11/25/22 02/23/23  Creig Hines, NP  traMADol HCl 100 MG TABS Take 100 mg by mouth every 8 (eight) hours as needed. 10/26/22   Earna Coder, MD     Critical care time: 40 minutes      Harlon Ditty, AGACNP-BC [facility] Prefer epic messenger for cross cover needs If after hours, please call E-link

## 2023-04-08 NOTE — Progress Notes (Signed)
Heart Failure Navigator Progress Note  Assessed for Heart & Vascular TOC clinic readiness.  Patient does not meet criteria due to Advanced Heart Failure Team patient of Dr. Arvilla Meres, MD.  Navigator will sign off at this time.  Roxy Horseman, RN, BSN [facility] Heart Failure Navigator Secure Chat Only

## 2023-04-08 NOTE — Progress Notes (Signed)
Peripherally Inserted Central Catheter Placement  The IV Nurse has discussed with the patient and/or persons authorized to consent for the patient, the purpose of this procedure and the potential benefits and risks involved with this procedure.  The benefits include less needle sticks, lab draws from the catheter, and the patient may be discharged home with the catheter. Risks include, but not limited to, infection, bleeding, blood clot (thrombus formation), and puncture of an artery; nerve damage and irregular heartbeat and possibility to perform a PICC exchange if needed/ordered by physician.  Alternatives to this procedure were also discussed.  Bard Power PICC patient education guide, fact sheet on infection prevention and patient information card has been provided to patient /or left at bedside.    PICC Placement Documentation  PICC Triple Lumen 04/08/23 Left Basilic 47 cm 0 cm (Active)  Indication for Insertion or Continuance of Line Prolonged intravenous therapies 04/08/23 1605  Exposed Catheter (cm) 0 cm 04/08/23 1605  Site Assessment Clean, Dry, Intact 04/08/23 1605  Lumen #1 Status Flushed;Blood return noted;Saline locked 04/08/23 1605  Lumen #2 Status Flushed;Saline locked;Blood return noted 04/08/23 1605  Lumen #3 Status Flushed;Blood return noted;Saline locked 04/08/23 1605  Dressing Type Transparent;Securing device 04/08/23 1605  Dressing Status Antimicrobial disc/dressing in place;Clean, Dry, Intact 04/08/23 1605  Line Care Connections checked and tightened 04/08/23 1605  Line Adjustment (NICU/IV Team Only) No 04/08/23 1605  Dressing Change Due 04/15/23 04/08/23 1605       Romie Jumper 04/08/2023, 4:09 PM

## 2023-04-08 NOTE — Progress Notes (Addendum)
PHARMACY - ANTICOAGULATION CONSULT NOTE  Pharmacy Consult for Heparin Drip Indication: empirically treat for pulmonary embolism with IV heparin, to begin 2 hours after hemostasis was achieved at the right femoral arteriotomy with Mynx deployment. - per Cardiology note 04/05/23  Patient Measurements: Height: 6' (182.9 cm) Weight: 106.9 kg (235 lb 10.8 oz) IBW/kg (Calculated) : 77.6 Heparin Dosing Weight: 99 kg  Labs: Recent Labs    04/05/23 1251 04/05/23 1423 04/05/23 1658 04/05/23 1703 04/05/23 1743 04/06/23 0316 04/06/23 1042 04/07/23 0401 04/07/23 0936 04/07/23 1802 04/08/23 0412  HGB 12.5*   < >  --   --    < > 13.0  --  10.2*  --   --  8.5*  HCT 40.8   < >  --   --    < > 38.2*  --  30.8*  --   --  25.2*  PLT 306  --   --   --    < > 291  --  176  --   --  132*  APTT  --   --   --  37*  --   --   --   --   --   --   --   LABPROT  --   --   --  17.4*  --   --   --   --   --   --   --   INR  --   --   --  1.4*  --   --   --   --   --   --   --   HEPARINUNFRC  --   --   --   --   --  >1.10*   < >  --  0.56 0.27* 0.27*  CREATININE 2.32*  --   --  2.33*   < > 2.78*   < > 2.30*  --  2.28* 2.17*  TROPONINIHS 21*  --  898*  --   --   --   --   --   --   --   --    < > = values in this interval not displayed.    Estimated Creatinine Clearance: 36.6 mL/min (A) (by C-G formula based on SCr of 2.17 mg/dL (H)).   Medical History: Past Medical History:  Diagnosis Date   Alternating RBBB & LBBB    Ankle swelling 2024   Arthritis    neck   Bladder tumor    CAD (coronary artery disease)    a. 08/2017 STEMI/Cath: LM 80ost/d, LAD 100p, LCX 95 ost/p, RCA 60p, 47m, RPDA 80ost; b. 08/2017 CABG x 3: LIMA->LAD, VG->OM, VG->PDA.   Cancer [facility])    bladder tumor   Cancer of base of tongue (HCC)    Essential hypertension    HFimpEF (heart failure with improved ejection fraction) (HCC)    a. 08/2017 TEE: EF 30-35%, sept/ant/inf HK, apical AK. 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 12/2017 Echo: EF  45-50%, diff HK. Gr1 DD. Mildly reduced RV fxn. Mild RAE; c. 09/2022 Echo: EF 55-60%, mod LVH, GrI DD, nl RV fxn, mildly dil RA, mild MR.   Hyperlipidemia LDL goal <70    Ischemic cardiomyopathy    a. 08/2017 TEE: EF 30-35%; b. 12/2017 Echo: EF 45-50%; c. 09/2022 Echo: EF 55-60%.   Myocardial infarction (HCC) 08/27/2017   Shingles    2018 - right side   Vertigo    several months ago   Assessment: 78 yo  M to start heparin drip to empirically treat for pulmonary embolism with IV heparin, to begin 2 hours after hemostasis was achieved at the right femoral arteriotomy with Mynx deployment.  Pt with Cardiac arrest, complete heart block, and cardiogenic shock s/p cardiac cath: showed severe native CAD with patent LIMA-LAD and SVG-OM. SVG-RCA is occluded, though this does not appear acute. Pulmonary embolism is also a consideration given active cancer.   2/13: Unable to perform CTA PE Chest due to acute kidney injury consider lung VQ scan once stabilized to travel ~ Venous [place] of Bilateral LE's negative for DVT   Baseline labs: Hgb 12.8 Plt 277  INR 1.4  aPTT 37  (did receive some heparin in cath) No anticoagulation PTA per Med rec and Rx fill hx, chart search.  Goal of Therapy:  Heparin level 0.3-0.7 units/ml Monitor platelets by anticoagulation protocol: Yes  0212 0316 HL>1.10, SUPRAthera; 1600 units/hr 0212 1408 HL 1.03, SUPRAthera; 1300 units/hr 0213 0005 HL 0.74, SUPRAthera; 1000 units/hr 0213 0936 HL 0.56, Therapeutic x 1; 900 units/hr 0213 1802 HL 0.27, SUBtherapeutic; 900 units/hr 0214 0412 HL 0.27, SUBtherapeutic; 1100 units/hr  Plan:  -- Will order heparin bolus 1400 units x 1 -- increase heparin drip from 1100 to 1300 units/hr -- Re-check HL 8 hours after rate change -- Daily CBC per protocol while on IV heparin  Bettey Costa, PharmD 04/08/2023 4:56 AM

## 2023-04-09 DIAGNOSIS — I639 Cerebral infarction, unspecified: Secondary | ICD-10-CM | POA: Diagnosis not present

## 2023-04-09 DIAGNOSIS — I5023 Acute on chronic systolic (congestive) heart failure: Secondary | ICD-10-CM | POA: Diagnosis not present

## 2023-04-09 DIAGNOSIS — I469 Cardiac arrest, cause unspecified: Secondary | ICD-10-CM | POA: Diagnosis not present

## 2023-04-09 DIAGNOSIS — I442 Atrioventricular block, complete: Secondary | ICD-10-CM | POA: Diagnosis not present

## 2023-04-09 DIAGNOSIS — E875 Hyperkalemia: Secondary | ICD-10-CM | POA: Diagnosis not present

## 2023-04-09 DIAGNOSIS — I25118 Atherosclerotic heart disease of native coronary artery with other forms of angina pectoris: Secondary | ICD-10-CM | POA: Diagnosis not present

## 2023-04-09 DIAGNOSIS — R579 Shock, unspecified: Secondary | ICD-10-CM

## 2023-04-09 DIAGNOSIS — E876 Hypokalemia: Secondary | ICD-10-CM

## 2023-04-09 DIAGNOSIS — N179 Acute kidney failure, unspecified: Secondary | ICD-10-CM | POA: Diagnosis not present

## 2023-04-09 DIAGNOSIS — Z8673 Personal history of transient ischemic attack (TIA), and cerebral infarction without residual deficits: Secondary | ICD-10-CM

## 2023-04-09 DIAGNOSIS — G9341 Metabolic encephalopathy: Secondary | ICD-10-CM | POA: Diagnosis not present

## 2023-04-09 LAB — GLUCOSE, CAPILLARY
Glucose-Capillary: 101 mg/dL — ABNORMAL HIGH (ref 70–99)
Glucose-Capillary: 121 mg/dL — ABNORMAL HIGH (ref 70–99)
Glucose-Capillary: 81 mg/dL (ref 70–99)
Glucose-Capillary: 84 mg/dL (ref 70–99)
Glucose-Capillary: 86 mg/dL (ref 70–99)
Glucose-Capillary: 86 mg/dL (ref 70–99)

## 2023-04-09 LAB — CBC
HCT: 24.5 % — ABNORMAL LOW (ref 39.0–52.0)
Hemoglobin: 8.4 g/dL — ABNORMAL LOW (ref 13.0–17.0)
MCH: 35 pg — ABNORMAL HIGH (ref 26.0–34.0)
MCHC: 34.3 g/dL (ref 30.0–36.0)
MCV: 102.1 fL — ABNORMAL HIGH (ref 80.0–100.0)
Platelets: 128 10*3/uL — ABNORMAL LOW (ref 150–400)
RBC: 2.4 MIL/uL — ABNORMAL LOW (ref 4.22–5.81)
RDW: 12.2 % (ref 11.5–15.5)
WBC: 15.9 10*3/uL — ABNORMAL HIGH (ref 4.0–10.5)
nRBC: 0 % (ref 0.0–0.2)

## 2023-04-09 LAB — COOXEMETRY PANEL
Carboxyhemoglobin: 2.8 % — ABNORMAL HIGH (ref 0.5–1.5)
Carboxyhemoglobin: 2.6 % — ABNORMAL HIGH (ref 0.5–1.5)
Methemoglobin: <0.7 % (ref 0.0–1.5)
Methemoglobin: 0.7 % (ref 0.0–1.5)
O2 Saturation: 76.2 %
O2 Saturation: 76.6 %
Total hemoglobin: 8.1 g/dL — ABNORMAL LOW (ref 12.0–16.0)
Total hemoglobin: 9.2 g/dL — ABNORMAL LOW (ref 12.0–16.0)
Total oxygen content: 73.5 %
Total oxygen content: 74.1 %

## 2023-04-09 LAB — POTASSIUM: Potassium: 3.6 mmol/L (ref 3.5–5.1)

## 2023-04-09 LAB — BASIC METABOLIC PANEL
Anion gap: 8 (ref 5–15)
BUN: 57 mg/dL — ABNORMAL HIGH (ref 8–23)
CO2: 29 mmol/L (ref 22–32)
Calcium: 8.3 mg/dL — ABNORMAL LOW (ref 8.9–10.3)
Chloride: 105 mmol/L (ref 98–111)
Creatinine, Ser: 2.04 mg/dL — ABNORMAL HIGH (ref 0.61–1.24)
GFR, Estimated: 33 mL/min — ABNORMAL LOW (ref >60)
Glucose, Bld: 107 mg/dL — ABNORMAL HIGH (ref 70–99)
Potassium: 2.9 mmol/L — ABNORMAL LOW (ref 3.5–5.1)
Sodium: 142 mmol/L (ref 135–145)

## 2023-04-09 LAB — HEPATIC FUNCTION PANEL
ALT: 64 U/L — ABNORMAL HIGH (ref 0–44)
AST: 37 U/L (ref 15–41)
Albumin: 2.4 g/dL — ABNORMAL LOW (ref 3.5–5.0)
Alkaline Phosphatase: 73 U/L (ref 38–126)
Bilirubin, Direct: 0.2 mg/dL (ref 0.0–0.2)
Indirect Bilirubin: 0.7 mg/dL (ref 0.3–0.9)
Total Bilirubin: 0.9 mg/dL (ref 0.0–1.2)
Total Protein: 5.3 g/dL — ABNORMAL LOW (ref 6.5–8.1)

## 2023-04-09 LAB — PROCALCITONIN: Procalcitonin: 1.7 ng/mL

## 2023-04-09 LAB — TRIGLYCERIDES
Triglycerides: 752 mg/dL — ABNORMAL HIGH (ref <150)
Triglycerides: 118 mg/dL (ref <150)

## 2023-04-09 LAB — MAGNESIUM: Magnesium: 2.1 mg/dL (ref 1.7–2.4)

## 2023-04-09 LAB — PHOSPHORUS: Phosphorus: 4 mg/dL (ref 2.5–4.6)

## 2023-04-09 LAB — TROPONIN I (HIGH SENSITIVITY): Troponin I (High Sensitivity): 155 ng/L (ref <18)

## 2023-04-09 MED ORDER — ISOSORB DINITRATE-HYDRALAZINE 20-37.5 MG PO TABS
0.5000 | ORAL_TABLET | Freq: Four times a day (QID) | ORAL | Status: DC
  Administered 2023-04-09 – 2023-04-10 (×4): 0.5
  Filled 2023-04-09 (×8): qty 0.5

## 2023-04-09 MED ORDER — FUROSEMIDE 10 MG/ML IJ SOLN
40.0000 mg | Freq: Once | INTRAMUSCULAR | Status: AC
  Administered 2023-04-09: 40 mg via INTRAVENOUS
  Filled 2023-04-09: qty 4

## 2023-04-09 MED ORDER — LIDOCAINE 5 % EX PTCH
1.0000 | MEDICATED_PATCH | CUTANEOUS | Status: DC
  Administered 2023-04-09 – 2023-04-21 (×13): 1 via TRANSDERMAL
  Filled 2023-04-09 (×13): qty 1

## 2023-04-09 MED ORDER — OXYCODONE HCL 5 MG PO TABS
10.0000 mg | ORAL_TABLET | Freq: Four times a day (QID) | ORAL | Status: DC
  Administered 2023-04-09 – 2023-04-13 (×16): 10 mg
  Filled 2023-04-09 (×16): qty 2

## 2023-04-09 MED ORDER — ACETAMINOPHEN 325 MG PO TABS
650.0000 mg | ORAL_TABLET | Freq: Four times a day (QID) | ORAL | Status: DC
  Administered 2023-04-09 – 2023-04-13 (×16): 650 mg
  Filled 2023-04-09 (×16): qty 2

## 2023-04-09 MED ORDER — FENTANYL CITRATE (PF) 100 MCG/2ML IJ SOLN
INTRAMUSCULAR | Status: AC
  Filled 2023-04-09: qty 2

## 2023-04-09 MED ORDER — POTASSIUM CHLORIDE 10 MEQ/100ML IV SOLN
10.0000 meq | INTRAVENOUS | Status: AC
  Administered 2023-04-09 (×6): 10 meq via INTRAVENOUS
  Filled 2023-04-09 (×6): qty 100

## 2023-04-09 MED ORDER — FENTANYL CITRATE (PF) 100 MCG/2ML IJ SOLN
100.0000 ug | Freq: Once | INTRAMUSCULAR | Status: AC
  Administered 2023-04-09: 100 ug via INTRAVENOUS

## 2023-04-09 MED ORDER — OXYCODONE HCL 5 MG PO TABS: 15.0000 mg | ORAL_TABLET | Freq: Four times a day (QID) | ORAL | Status: DC

## 2023-04-09 MED ORDER — POTASSIUM CHLORIDE 20 MEQ PO PACK
40.0000 meq | PACK | Freq: Once | ORAL | Status: AC
  Administered 2023-04-09: 40 meq
  Filled 2023-04-09: qty 2

## 2023-04-09 MED ORDER — ISOSORB DINITRATE-HYDRALAZINE 20-37.5 MG PO TABS
0.5000 | ORAL_TABLET | Freq: Four times a day (QID) | ORAL | Status: DC
  Administered 2023-04-09 (×2): 0.5 via ORAL
  Filled 2023-04-09 (×4): qty 0.5

## 2023-04-09 NOTE — Progress Notes (Signed)
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  He presented to [facility] ER via EMS from [facility] following a witnessed cardiac arrest.  Bystander CPR initiated and EMS arrived on the scene within 30 seconds cardiac rhythm asystole.  ROSC initially achieved, however pt in and out of PEA/asystole.    Pt recently evaluated in the ER on 02/4 with urinary urgency and frequency that started 5 days prior to that presentation.  Diagnosed with a UTI and prescribed Bactrim DS twice daily for 10 days.  However, symptoms persisted prompting [facility] visit today.  ED Course  Upon arrival to the ER pt on NRB with pulse.  It was reported during chest compressions the pt began to fight and pulling at medical devices.  While in the the ER waiting room the pt cardiac arrested again requiring ACLS protocol and mechanical intubation.  Pt with varied cardiac rhythm during cardiac arrest PEA/asystole/complete heart block.  He required transcutaneously pacing.  EKG showed apparent sinus rhythm with complete heart block and ventricular escape rhythm with LBBB morphology.  Total downtime estimated 2hrs from out of hospital cardiac arrest and inpatient cardiac arrest.  Pt required epinephrine gtt due to bradycardia and hypotension.  Pt taken emergently to the cardiac cath lab for temporary transvenous pacemaker placement, emergent cardiac catheterization, and swan-ganz catheter placement.   Cardiac cath revealed severe native CAD with patent LIMA-LAD and SVG-OM. SVG-RCA is occluded, but did not appear  acute and unlikely the inciting factor for today's cardiac arrest.  Per cardiology if pt makes a meaningful recovery he may require revascularization of the RCA.  Pt admitted to the ICU post cardiac cath PCCM consulted to asume care.    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  Worsening AKI and mild hyperkalemia, given shifting measures.  Low threshold for Nephrology consultation. 02/13: Weaning down vasopressors, Vasopressin off, Levo @ 7, Epi @ 6.  On minimal vent support.  Renal function and hyperkalemia slowly improving.  Advanced Heart Failure diuresing with 40 mg IV Lasix x1 dose for CVP of 15.  Perform WUA. 02/14: Weaned off vasopressors  On minimal vent support, failed WUA due to        severe hypertension and increased WOB.  On WUA only opening eyes, not        following commands.  MRI Brain revealed 2 small acute infarctions affecting the        cortical brain in theright frontal region.Theone Murdoch, temporary pacer and right        femoral venous sheath removed.  AKI slowly improving.  Heparin gtt discontinued  given low suspicion for PE.  To have PICC placed per Heart Failure team request. 02/15: Pt remains mechanically intubated.  All sedation off brainstem reflexes intact and opens eyes to voice.  Mentation precluding extubation.  Neurology consulted   Interim History / Subjective:  As outlined above under  Significant Hospital Events section  Micro Data:  2/11: COVID-19 PCR>> negative 2/11: MRSA PCR>> negative 2/11: Urine>>no growth 2/12: Blood cultures x2>>no growth to date  Antimicrobials:   Anti-infectives (From admission, onward)    Start     Dose/Rate Route Frequency Ordered Stop   04/06/23 1100  vancomycin (VANCOREADY) IVPB 2000 mg/400 mL        2,000 mg 200 mL/hr over 120 Minutes Intravenous  Once 04/06/23 1006 04/06/23 1253   04/06/23 1100  piperacillin-tazobactam (ZOSYN) IVPB 3.375 g        3.375 g 12.5 mL/hr over 240 Minutes Intravenous Every 8 hours 04/06/23 1006 04/11/23 1359   04/05/23 1830  cefTRIAXone (ROCEPHIN) 2 g in sodium chloride 0.9 % 100 mL IVPB  Status:  Discontinued        2 g 200 mL/hr over 30 Minutes Intravenous Every 24 hours 04/05/23 1736 04/06/23 0827      Objective   Blood pressure 120/64, pulse (!) 104, temperature 98.7 F (37.1 C), temperature source Axillary, resp. rate (!) 23, height 6' (1.829 m), weight 104.2 kg, SpO2 97%. CVP:  [1 mmHg-15 mmHg] 13 mmHg  Vent Mode: PRVC FiO2 (%):  [30 %] 30 % Set Rate:  [26 bmp] 26 bmp Vt Set:  [450 mL] 450 mL PEEP:  [5 cmH20] 5 cmH20   Intake/Output Summary (Last 24 hours) at 04/09/2023 1532 Last data filed at 04/09/2023 1500 Gross per 24 hour  Intake 3176.34 ml  Output 3435 ml  Net -258.66 ml   Filed Weights   04/07/23 0745 04/08/23 0320 04/09/23 0324  Weight: 111.3 kg 106.9 kg 104.2 kg   Examination: General: Acute on chronically-ill appearing male, NAD mechanically intubated  HENT: Supple, mild JVD  Lungs: Faint rhonchi throughout, even, non labored, overbreathing the vent Cardiovascular: RRR, no m/r/g, 2+ radial/1+ distal pulses, bilateral extremities cool to touch  Abdomen: +BS x4, obese, soft non distended  Extremities: Normal bulk and tone, no deformities Neuro: Sedated, opens eyes to voice, PERRL, +cough/gag reflexes GU: Indwelling foley catheter draining yellow urine   Resolved Hospital  Problem list   Anion gap metabolic acidosis  Lactic acidosis   Assessment & Plan:  #Acute metabolic encephalopathy  #Mechanical intubation pain/discomfort  MR Brain 02/14: 2 small acute infarctions affecting the cortical brain in the right frontal region.  Mild chronic small-vessel ischemic change of the cerebral hemispheric white matter with a few old small cortical infarctions scattered about the right hemisphere suggesting chronic/recurrent micro embolic infarctions. Recommend carotid evaluation. 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 SVG-RCA is occluded, but did not appear acute and unlikely the inciting factor for today's cardiac arrest Echocardiogram 04/07/23:  LVEF 40-45%, mild LVH, Grade I DD, RV systolic function not well visualized, RV size normal - Continuous telemetry monitoring - Maintain  map 65 or higher  - Lactic acid has normalized - Trend HS Troponin until peaked - Trend Coox panel - Diuresis as BP and renal function permits - Cardiology following, appreciate input: recommending if pt has a meaningful recovery may require revascularization of the RCA and if junctional rhythm persist start amiodarone gtt - Heparin gtt discontinued 2/14 - Advanced heart failure team following along - Bidil started for bp control per cardiology for bp control  - Thyroid elevated at 18, but free T4 normal at 0.93  #Acute hypoxic hypercapnic respiratory failure  #Possible PE~low suspicion  #Aspiration pneumonia secondary to acute encephalopathy post cardiac arrest   #Mechanical intubation  - Full vent support, implement lung protective strategies - Plateau pressures less than 30 cm H20 - Wean FiO2 & PEEP as tolerated to maintain O2 sats >92% - Follow intermittent Chest X-ray & ABG as needed - Spontaneous Breathing Trials when respiratory parameters met and mental status permits - VAP bundle implemented  - Prn Bronchodilators - Unable to perform CTA PE Chest due to acute kidney injury consider lung VQ scan once stabilized to travel ~ Venous [place] of Bilateral LE's negative for DVT, Echo with no evidence of RV strain  #Acute kidney injury secondary to ATN~improving  - Trend BMP  - Strict I&O's  - Ensure adequate renal perfusion - Avoid nephrotoxic agents as able - Replace electrolytes as indicated ~ Pharmacy following for assistance with electrolyte replacement  #UTI  #Aspiration pneumonia  - Trend WBC and monitor fever curve - Trend PCT  - Follow cultures as above - Continue abx as outlined above   #Transaminitis  - Trend hepatic function panel  - Avoid hepatotoxic medications as able   #Hyperglycemia  - Hemoglobin A1c 02/11: 5.9 - CBG's q4hrs  - Very sensitive SSI  - Follow hyper/hypoglycemic protocol  - Target CBG readings 140 to 180   Best Practice (right click and "Reselect all SmartList Selections" daily)   Diet/type: tube feeds DVT prophylaxis SQ Heparin Pressure ulcer(s): N/A GI prophylaxis: H2B Lines: Portacath present on admission  Foley:  Yes, and it is still needed Code Status:  full code Last date of multidisciplinary goals of care discussion [04/09/23]  2/15: Pts sister updated at bedside regarding pts condition and current plan of care.  Labs   CBC: Recent Labs  Lab 04/05/23 1743 04/06/23 0316 04/07/23 0401 04/08/23 0412 04/09/23 0413  WBC 29.0* 34.5* 29.7* 24.0* 15.9*  NEUTROABS 25.2*  --   --   --   --   HGB 12.8* 13.0 10.2* 8.5* 8.4*  HCT 37.3* 38.2* 30.8* 25.2* 24.5*  MCV 98.4 97.7 99.0 99.2 102.1*  PLT  277 291 176 132* 128*    Basic Metabolic Panel: Recent Labs  Lab 04/05/23 1658 04/05/23 1703 04/05/23 1813 04/05/23 2226 04/06/23 0316 04/06/23 0603 04/07/23 0401 04/07/23 1802 04/08/23 0412 04/08/23 1800 04/09/23 0413 04/09/23 1453  NA  --    < >  --    < > 140   < > 137 140 141 144 142  --   K  --    < >  --    < > 7.1*   < > 5.3* 4.2 3.7 3.6 2.9* 3.6  CL  --    < >  --    < > 105   < > 105 105 106 106 105  --   CO2  --    < >  --    < > 24   < >  23 26 26 26 29   --   GLUCOSE  --    < >  --    < > 162*   < > 168* 87 105* 97 107*  --   BUN  --    < >  --    < > 47*   < > 50* 50* 50* 54* 57*  --   CREATININE  --    < >  --    < > 2.78*   < > 2.30* 2.28* 2.17* 2.03* 2.04*  --   CALCIUM  --    < >  --    < > 10.5*   < > 9.0 8.7* 8.4* 8.6* 8.3*  --   MG 2.5*  --   --   --  2.1  --  1.8  --  2.2  --  2.1  --   PHOS  --   --  6.7*  --  4.0  --  4.4  --  3.5  --  4.0  --    < > = values in this interval not displayed.   GFR: Estimated Creatinine Clearance: 38.4 mL/min (A) (by C-G formula based on SCr of 2.04 mg/dL (H)). Recent Labs  Lab 04/05/23 1658 04/05/23 1743 04/06/23 0316 04/06/23 1042 04/06/23 1408 04/07/23 0401 04/07/23 0758 04/08/23 0412 04/09/23 0413  PROCALCITON 0.28  --  7.41  --   --   --   --   --   --   WBC  --    < > 34.5*  --   --  29.7*  --  24.0* 15.9*  LATICACIDVEN 2.9*   < > 3.0* 3.7* 2.8*  --  1.5  --   --    < > = values in this interval not displayed.    Liver Function Tests: Recent Labs  Lab 04/05/23 1251 04/05/23 1703 04/07/23 0758  AST 83* 273* 83*  ALT 134* 300* 146*  ALKPHOS 99 124 80  BILITOT 0.8 0.9 0.7  PROT 6.8 5.9* 5.5*  ALBUMIN 3.5 2.9* 2.6*   No results for input(s): "LIPASE", "AMYLASE" in the last 168 hours. No results for input(s): "AMMONIA" in the last 168 hours.  ABG    Component Value Date/Time   PHART 7.35 04/07/2023 0500   PCO2ART 44 04/07/2023 0500   PO2ART 103 04/07/2023 0500   HCO3 24.3 04/07/2023 0500    TCO2 33 (H) 04/05/2023 1459   ACIDBASEDEF 1.5 04/07/2023 0500   O2SAT 76.2 04/09/2023 0600     Coagulation Profile: Recent Labs  Lab 04/05/23 1703  INR 1.4*    Cardiac Enzymes: No results for input(s): "CKTOTAL", "CKMB", "CKMBINDEX", "TROPONINI" in the last 168 hours.  HbA1C: Hgb A1c MFr Bld  Date/Time Value Ref Range Status  04/05/2023 07:38 PM 5.9 (H) 4.8 - 5.6 % Final    Comment:    (NOTE) Pre diabetes:          5.7%-6.4%  Diabetes:              >6.4%  Glycemic control for   <7.0% adults with diabetes   08/28/2017 02:27 AM 5.6 4.8 - 5.6 % Final    Comment:    (NOTE) Pre diabetes:          5.7%-6.4% Diabetes:              >6.4% Glycemic control for   <7.0% adults with diabetes     CBG: Recent Labs  Lab 04/08/23 1932 04/08/23  2340 04/09/23 0336 04/09/23 0746 04/09/23 1142  GLUCAP 91 105* 101* 86 86    Review of Systems:   Unable to assess pt mechanically intubated   Past Medical History:  He,  has a past medical history of Alternating RBBB & LBBB, Ankle swelling (2024), Arthritis, Bladder tumor, CAD (coronary artery disease), Cancer (HCC), Cancer of base of tongue (HCC), Essential hypertension, 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   Surgical History:   Past Surgical History:  Procedure Laterality Date   CARDIAC CATHETERIZATION     CATARACT EXTRACTION W/PHACO Left 07/18/2018   Procedure: CATARACT EXTRACTION PHACO AND INTRAOCULAR LENS PLACEMENT (IOC)  LEFT;  Surgeon: Nevada Crane, MD;  Location: [facility];  Service: Ophthalmology;  Laterality: Left;   CATARACT EXTRACTION W/PHACO Right 08/14/2018   Procedure: CATARACT EXTRACTION PHACO AND INTRAOCULAR LENS PLACEMENT (IOC)  RIGHT;  Surgeon: Nevada Crane, MD;  Location: [facility];  Service: Ophthalmology;  Laterality: Right;   CORONARY  ARTERY BYPASS GRAFT N/A 08/27/2017   Procedure: CORONARY ARTERY BYPASS GRAFTING (CABG) ON PUMP USING LEFT INTERNAL MAMMARY ARTERY AND LEFT GREATER SAPHENOUS VEIN VIA ENDOVEIN HARVEST;  Surgeon: Alleen Borne, MD;  Location: [facility] OR;  Service: Open Heart Surgery;  Laterality: N/A;   CORONARY/GRAFT ACUTE MI REVASCULARIZATION N/A 08/27/2017   Procedure: Coronary/Graft Acute MI Revascularization;  Surgeon: Iran Ouch, MD;  Location: [facility] INVASIVE CV LAB;  Service: Cardiovascular;  Laterality: N/A;   CYSTOSCOPY W/ RETROGRADES Bilateral 12/25/2018   Procedure: CYSTOSCOPY WITH RETROGRADE PYELOGRAM;  Surgeon: Vanna Scotland, MD;  Location: [facility] ORS;  Service: Urology;  Laterality: Bilateral;   FRACTURE SURGERY Right 1958   arm and left wrist compound fracture, no metal   HERNIA REPAIR Right    inguinial   IR IMAGING GUIDED PORT INSERTION  10/15/2022   LEFT HEART CATH AND CORONARY ANGIOGRAPHY N/A 08/27/2017   Procedure: LEFT HEART CATH AND CORONARY ANGIOGRAPHY;  Surgeon: Iran Ouch, MD;  Location: [facility] INVASIVE CV LAB;  Service: Cardiovascular;  Laterality: N/A;   RIGHT/LEFT HEART CATH AND CORONARY/GRAFT ANGIOGRAPHY N/A 04/05/2023   Procedure: RIGHT/LEFT HEART CATH AND CORONARY/GRAFT ANGIOGRAPHY;  Surgeon: Yvonne Kendall, MD;  Location: [facility] INV[facility] CV LAB;  Service: Cardiovascular;  Laterality: N/A;   TEMPORARY PACEMAKER N/A 04/05/2023   Procedure: TEMPORARY PACEMAKER;  Surgeon: Yvonne Kendall, MD;  Location: [facility] INV[facility] LAB;  Service: Cardiovascular;  Laterality: N/A;   TRANSURETHRAL RESECTION OF BLADDER TUMOR WITH MITOMYCIN-C N/A 12/25/2018   Procedure: TRANSURETHRAL RESECTION OF BLADDER TUMOR WITH Gemcitabine;  Surgeon: Vanna Scotland, MD;  Location: [facility] ORS;  Service: Urology;  Laterality: N/A;     Social History:   reports that he quit smoking about 25 years ago. His smoking use included cigarettes. His smokeless tobacco use includes snuff. He reports that he does not currently use  alcohol. He reports that he does not currently use drugs.   Family History:  His family history includes Cervical cancer in his maternal grandmother; Heart failure in his mother; Lung disease in his father; Stroke in his father.   Allergies No Known Allergies   Home Medications  Prior to Admission medications   Medication Sig Start Date End Date Taking? Authorizing Provider  acetaminophen (TYLENOL) 650 MG CR tablet Take 650 mg by mouth every 8 (eight) hours as needed for pain.    [provider]  aspirin EC 81 MG EC tablet Take 1  tablet (81 mg total) by mouth daily. 09/03/17   Gold, Glenice Laine, PA-C  atorvastatin (LIPITOR) 80 MG tablet Take 1 tablet by mouth once daily 02/08/23   Creig Hines, NP  carvedilol (COREG) 12.5 MG tablet TAKE 1 TABLET BY MOUTH TWICE DAILY WITH A MEAL 03/10/23   End, Cristal Deer, MD  lidocaine-prilocaine (EMLA) cream Apply on the port. 30 -45 min  prior to port access. 10/08/22   Earna Coder, MD  losartan (COZAAR) 100 MG tablet Take 1 tablet by mouth once daily 03/10/23   End, Cristal Deer, MD  magic mouthwash (multi-ingredient) oral suspension Swish and spit 5-10 mLs 4 (four) times daily as needed. 11/24/22   Earna Coder, MD  Multiple Vitamin (MULTIVITAMIN WITH MINERALS) TABS tablet Take 1 tablet by mouth daily. One a Day over 50/ lunch    [provider]  nitroGLYCERIN (NITROSTAT) 0.4 MG SL tablet DISSOLVE ONE TABLET UNDER THE TONGUE EVERY 5 MINUTES AS NEEDED FOR CHEST PAIN.  DO NOT EXCEED A TOTAL OF 3 DOSES IN 15 MINUTES 03/24/23   Creig Hines, NP  OLANZapine (ZYPREXA) 5 MG tablet Take one pill at night. 11/03/22   Earna Coder, MD  ondansetron (ZOFRAN) 8 MG tablet One pill every 8 hours as needed for nausea/vomitting. 10/08/22   Earna Coder, MD  polyethylene glycol (MIRALAX) 17 g packet Take 17 g by mouth daily. 11/07/22   Romeo Apple, Myah A, PA-C  prochlorperazine (COMPAZINE) 10 MG tablet Take 1  tablet (10 mg total) by mouth every 6 (six) hours as needed for nausea or vomiting. 10/08/22   Earna Coder, MD  senna-docusate (SENOKOT-S) 8.6-50 MG tablet Take 1 tablet by mouth daily. 11/07/22   Romeo Apple, Myah A, PA-C  sucralfate (CARAFATE) 1 g tablet Take 1 tablet (1 g total) by mouth 4 (four) times daily -  with meals and at bedtime. Dissolve in 4 tablespoons warm water 30 minutes before meals. 10/26/22   Carmina Miller, MD  sulfamethoxazole-trimethoprim (BACTRIM DS) 800-160 MG tablet Take 1 tablet by mouth 2 (two) times daily for 10 days. 03/29/23 04/08/23  Becky Augusta, NP  torsemide (DEMADEX) 20 MG tablet Take 1 tablet (20 mg total) by mouth daily as needed (swelling and weight gain). 11/25/22 02/23/23  Creig Hines, NP  traMADol HCl 100 MG TABS Take 100 mg by mouth every 8 (eight) hours as needed. 10/26/22   Earna Coder, MD     Critical care time: 40 minutes        Zada Girt, AGNP  Pulmonary/Critical Care Pager [phone] (please enter 7 digits) PCCM Consult Pager [phone] (please enter 7 digits)

## 2023-04-09 NOTE — Plan of Care (Signed)
  Problem: Education: Goal: Knowledge of General Education information will improve Description: Including pain rating scale, medication(s)/side effects and non-pharmacologic comfort measures Outcome: Not Progressing   Problem: Health Behavior/Discharge Planning: Goal: Ability to manage health-related needs will improve Outcome: Not Progressing   Problem: Clinical Measurements: Goal: Ability to maintain clinical measurements within normal limits will improve Outcome: Not Progressing Goal: Will remain free from infection Outcome: Not Progressing Goal: Diagnostic test results will improve Outcome: Not Progressing Goal: Respiratory complications will improve Outcome: Not Progressing Goal: Cardiovascular complication will be avoided Outcome: Not Progressing   Problem: Activity: Goal: Risk for activity intolerance will decrease Outcome: Not Progressing   Problem: Nutrition: Goal: Adequate nutrition will be maintained Outcome: Not Progressing   Problem: Coping: Goal: Level of anxiety will decrease Outcome: Not Progressing   Problem: Elimination: Goal: Will not experience complications related to bowel motility Outcome: Not Progressing Goal: Will not experience complications related to urinary retention Outcome: Not Progressing   Problem: Pain Managment: Goal: General experience of comfort will improve and/or be controlled Outcome: Not Progressing   Problem: Safety: Goal: Ability to remain free from injury will improve Outcome: Not Progressing   Problem: Skin Integrity: Goal: Risk for impaired skin integrity will decrease Outcome: Not Progressing   Problem: Education: Goal: Understanding of CV disease, CV risk reduction, and recovery process will improve Outcome: Not Progressing   Problem: Activity: Goal: Ability to return to baseline activity level will improve Outcome: Not Progressing   Problem: Cardiovascular: Goal: Ability to achieve and maintain adequate  cardiovascular perfusion will improve Outcome: Not Progressing Goal: Vascular access site(s) Level 0-1 will be maintained Outcome: Not Progressing   Problem: Health Behavior/Discharge Planning: Goal: Ability to safely manage health-related needs after discharge will improve Outcome: Not Progressing   Problem: Education: Goal: Understanding of CV disease, CV risk reduction, and recovery process will improve Outcome: Not Progressing   Problem: Activity: Goal: Ability to return to baseline activity level will improve Outcome: Not Progressing   Problem: Cardiovascular: Goal: Ability to achieve and maintain adequate cardiovascular perfusion will improve Outcome: Not Progressing Goal: Vascular access site(s) Level 0-1 will be maintained Outcome: Not Progressing   Problem: Health Behavior/Discharge Planning: Goal: Ability to safely manage health-related needs after discharge will improve Outcome: Not Progressing   Problem: Education: Goal: Ability to describe self-care measures that may prevent or decrease complications (Diabetes Survival Skills Education) will improve Outcome: Not Progressing   Problem: Coping: Goal: Ability to adjust to condition or change in health will improve Outcome: Not Progressing   Problem: Fluid Volume: Goal: Ability to maintain a balanced intake and output will improve Outcome: Not Progressing   Problem: Health Behavior/Discharge Planning: Goal: Ability to identify and utilize available resources and services will improve Outcome: Not Progressing Goal: Ability to manage health-related needs will improve Outcome: Not Progressing   Problem: Metabolic: Goal: Ability to maintain appropriate glucose levels will improve Outcome: Not Progressing   Problem: Nutritional: Goal: Maintenance of adequate nutrition will improve Outcome: Not Progressing Goal: Progress toward achieving an optimal weight will improve Outcome: Not Progressing   Problem: Skin  Integrity: Goal: Risk for impaired skin integrity will decrease Outcome: Not Progressing   Problem: Tissue Perfusion: Goal: Adequacy of tissue perfusion will improve Outcome: Not Progressing

## 2023-04-09 NOTE — Progress Notes (Signed)
PHARMACY CONSULT NOTE - FOLLOW UP  Pharmacy Consult for Electrolyte Monitoring and Replacement   Recent Labs: Potassium (mmol/L)  Date Value  04/09/2023 2.9 (L)   Magnesium (mg/dL)  Date Value  16/11/9602 2.1   Calcium (mg/dL)  Date Value  54/10/8117 8.3 (L)   Albumin (g/dL)  Date Value  14/78/2956 2.6 (L)  04/01/2021 4.2   Phosphorus (mg/dL)  Date Value  21/30/8657 4.0   Sodium (mmol/L)  Date Value  04/09/2023 142  10/05/2022 141   Assessment: DC is a 78 yo male who presented to [facility] ED post cardiac arrest. Their downtime was 2 hours. Pharmacy has been consult to manage this patient's electrolytes while in the ICU.   Fluids: Norepi + propofol + vaso Nutrition: Vital high protein 79mL/hr + free water 30 mL Q4H Pertinent Medications:   Goal of Therapy:  WNL  Plan:  Medical team ordered Kcl 10 mEq IV x 6.  F/u with K+ at 1500 and AM labs.   Ronnald Ramp, PharmD 04/09/2023 8:32 AM

## 2023-04-09 NOTE — Progress Notes (Signed)
Rounding Note    Patient Name: Cory Hess Date of Encounter: 04/09/2023  [facility] Cardiologist: Yvonne Kendall, MD   Subjective   Patient seen on AM rounds. Remains intubated and sedated. Has been off of pressors since yesterday.  MRI of the brain completed yesterday revealing 2 small acute infarcts.  Hypertensive on exam.  CVP 5.  -1.5 L output in the last 24 hours  Inpatient Medications    Scheduled Meds:  aspirin  81 mg Per Tube Daily   atorvastatin  80 mg Per Tube Daily   Chlorhexidine Gluconate Cloth  6 each Topical Daily   docusate  100 mg Per Tube BID   feeding supplement (PROSource TF20)  60 mL Per Tube BID   free water  30 mL Per Tube Q4H   heparin injection (subcutaneous)  5,000 Units Subcutaneous Q8H   hydrocortisone sod succinate (SOLU-CORTEF) inj  100 mg Intravenous Q12H   insulin aspart  0-6 Units Subcutaneous Q4H   isosorbide-hydrALAZINE  0.5 tablet Oral Q6H   mouth rinse  15 mL Mouth Rinse Q2H   oxyCODONE  10 mg Per Tube Q6H   polyethylene glycol  17 g Per Tube Daily   sodium chloride flush  10-40 mL Intracatheter Q12H   sodium chloride flush  10-40 mL Intracatheter Q12H   Continuous Infusions:  sodium chloride 10 mL/hr at 04/09/23 0800   epinephrine Stopped (04/07/23 1108)   famotidine (PEPCID) IV Stopped (04/08/23 1942)   feeding supplement (VITAL HIGH PROTEIN) 60 mL/hr at 04/09/23 0800   fentaNYL infusion INTRAVENOUS 50 mcg/hr (04/08/23 2128)   norepinephrine (LEVOPHED) Adult infusion Stopped (04/09/23 0702)   piperacillin-tazobactam (ZOSYN)  IV 12.5 mL/hr at 04/09/23 0800   potassium chloride 10 mEq (04/09/23 0854)   propofol (DIPRIVAN) infusion 20 mcg/kg/min (04/09/23 0800)   vasopressin Stopped (04/07/23 0958)   PRN Meds: sodium chloride, acetaminophen, fentaNYL, ipratropium-albuterol, midazolam, ondansetron (ZOFRAN) IV, mouth rinse, sodium chloride flush, sodium chloride flush   Vital Signs    Vitals:   04/09/23 0530  04/09/23 0645 04/09/23 0700 04/09/23 0724  BP:      Pulse: 75 66 66 72  Resp: (!) 22 (!) 26 (!) 25 (!) 23  Temp:    98.3 F (36.8 C)  TempSrc:    Axillary  SpO2: 94% 92% 93% 91%  Weight:      Height:        Intake/Output Summary (Last 24 hours) at 04/09/2023 0940 Last data filed at 04/09/2023 0854 Gross per 24 hour  Intake 2188.3 ml  Output 3745 ml  Net -1556.7 ml      04/09/2023    3:24 AM 04/08/2023    3:20 AM 04/07/2023    7:45 AM  Last 3 Weights  Weight (lbs) 229 lb 11.5 oz 235 lb 10.8 oz 245 lb 6 oz  Weight (kg) 104.2 kg 106.9 kg 111.3 kg      Telemetry    Sinus 70 with occasional episodes of sinus tach- Personally Reviewed  ECG    No new tracings- Personally Reviewed  Physical Exam   GEN: No acute distress.   Neck: Unable to assess JVD due to tube holders Cardiac: RRR, no murmurs, rubs, or gallops.  Respiratory: Clear to auscultation bilaterally. GI: Soft, nontender, non-distended  MS: No edema; No deformity. Neuro: Intubated and sedated Psych: Intubated and sedated  Labs    High Sensitivity Troponin:   Recent Labs  Lab 04/05/23 1251 04/05/23 1658  TROPONINIHS 21* 898*  Chemistry Recent Labs  Lab 04/05/23 1251 04/05/23 1423 04/05/23 1703 04/05/23 2226 04/07/23 0401 04/07/23 0758 04/07/23 1802 04/08/23 0412 04/08/23 1800 04/09/23 0413  NA 139   < > 142   < > 137  --    < > 141 144 142  K 5.1   < > 5.7*   < > 5.3*  --    < > 3.7 3.6 2.9*  CL 101  --  106   < > 105  --    < > 106 106 105  CO2 16*  --  27   < > 23  --    < > 26 26 29   GLUCOSE 227*  --  155*   < > 168*  --    < > 105* 97 107*  BUN 36*  --  37*   < > 50*  --    < > 50* 54* 57*  CREATININE 2.32*  --  2.33*   < > 2.30*  --    < > 2.17* 2.03* 2.04*  CALCIUM 9.2  --  11.2*   < > 9.0  --    < > 8.4* 8.6* 8.3*  MG  --    < >  --    < > 1.8  --   --  2.2  --  2.1  PROT 6.8  --  5.9*  --   --  5.5*  --   --   --   --   ALBUMIN 3.5  --  2.9*  --   --  2.6*  --   --   --   --   AST  83*  --  273*  --   --  83*  --   --   --   --   ALT 134*  --  300*  --   --  146*  --   --   --   --   ALKPHOS 99  --  124  --   --  80  --   --   --   --   BILITOT 0.8  --  0.9  --   --  0.7  --   --   --   --   GFRNONAA 28*  --  28*   < > 29*  --    < > 31* 33* 33*  ANIONGAP 22*  --  9   < > 9  --    < > 9 12 8    < > = values in this interval not displayed.    Lipids  Recent Labs  Lab 04/07/23 0758 04/09/23 0413 04/09/23 0711  CHOL 69  --   --   TRIG 114   < > 118  HDL 21*  --   --   LDLCALC 25  --   --   CHOLHDL 3.3  --   --    < > = values in this interval not displayed.    Hematology Recent Labs  Lab 04/07/23 0401 04/08/23 0412 04/09/23 0413  WBC 29.7* 24.0* 15.9*  RBC 3.11* 2.54* 2.40*  HGB 10.2* 8.5* 8.4*  HCT 30.8* 25.2* 24.5*  MCV 99.0 99.2 102.1*  MCH 32.8 33.5 35.0*  MCHC 33.1 33.7 34.3  RDW 11.8 11.9 12.2  PLT 176 132* 128*   Thyroid  Recent Labs  Lab 04/05/23 1658  TSH 18.053*  FREET4 0.93    BNP Recent Labs  Lab 04/05/23 1743  BNP  169.6*    DDimer  Recent Labs  Lab 04/05/23 1658  DDIMER 6.28*     Radiology    MR BRAIN WO CONTRAST Result Date: 04/08/2023 CLINICAL DATA:  Mental status change of unknown cause. 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 EXAM: MRI HEAD WITHOUT CONTRAST TECHNIQUE: Multiplanar, multiecho pulse sequences of the brain and surrounding structures were obtained without intravenous contrast. COMPARISON:  None Available. FINDINGS: Brain: Diffusion imaging shows 2 small acute infarctions affecting the cortical brain in the right frontal region. Otherwise, the brainstem and cerebellum are normal. Cerebral hemispheres show mild chronic small-vessel ischemic change of the white matter with a few old small cortical infarctions scattered about the right hemisphere suggesting chronic/recurrent micro embolic infarctions. Recommend carotid evaluation. No evidence of mass, hemorrhage, hydrocephalus or  extra-axial collection. Vascular: Major vessels at the base of the brain show flow. Skull and upper cervical spine: Negative Sinuses/Orbits: Mild seasonal mucosal inflammatory changes of the sinuses. Orbits negative. Other: Tiny mastoid effusions. IMPRESSION: 1. 2 small acute infarctions affecting the cortical brain in the right frontal region. 2. Mild chronic small-vessel ischemic change of the cerebral hemispheric white matter with a few old small cortical infarctions scattered about the right hemisphere suggesting chronic/recurrent micro embolic infarctions. Recommend carotid evaluation. Electronically Signed   By: Paulina Fusi M.D.   On: 04/08/2023 18:30   [place] EKG SITE RITE Result Date: 04/08/2023 If Site Rite image not attached, placement could not be confirmed due to current cardiac rhythm.  ECHOCARDIOGRAM COMPLETE Result Date: 04/07/2023    ECHOCARDIOGRAM REPORT   Patient Name:   SEGUNDO MAKELA Date of Exam: 04/07/2023 Medical Rec #:  [id number]     Height:       72.0 in Accession #:    [id number]    Weight:       245.4 lb Date of Birth:  03/08/1945     BSA:          2.324 m Patient Age:    77 years      BP:           139/61 mmHg Patient Gender: M             HR:           75 bpm. Exam Location:  [facility] Procedure: 2D Echo, Cardiac Doppler and Color Doppler (Both Spectral and Color            Flow Doppler were utilized during procedure). Indications:     Cardiac Arrest  History:         Patient has prior history of Echocardiogram examinations, most                  recent 09/27/2022. CHF, Acute MI, CAD and Previous Myocardial                  Infarction; Prior CABG.  Sonographer:     Mikki Harbor Referring Phys:  [id number] CHRISTOPHER END Diagnosing Phys: Julien Nordmann MD  Sonographer Comments: Technically challenging study due to limited acoustic windows, suboptimal apical window, suboptimal subcostal window and echo performed with patient supine and on artificial respirator. IMPRESSIONS  1. Very challenging  images, definity not used  2. Left ventricular ejection fraction, by estimation, is 40 to 45 %. The left ventricle has mildly decreased function. The left ventricle demonstrates regional wall motion abnormalities (images suggest anteroseptal, inferior/posterior and apical hypokinesis, possibly anterior wall hypokinesis). There is mild left ventricular hypertrophy. Left ventricular diastolic parameters are  consistent with Grade I diastolic dysfunction (impaired relaxation).  3. Right ventricular systolic function was not well visualized. The right ventricular size is normal.  4. The mitral valve is normal in structure. No evidence of mitral valve regurgitation. No evidence of mitral stenosis.  5. The aortic valve is normal in structure. There is mild calcification of the aortic valve. Aortic valve regurgitation is not visualized. Aortic valve sclerosis/calcification is present, without any evidence of aortic stenosis. Aortic valve mean gradient measures 2.0 mmHg. FINDINGS  Left Ventricle: Left ventricular ejection fraction, by estimation, is 40 to 45%. The left ventricle has mildly decreased function. The left ventricle demonstrates regional wall motion abnormalities. The left ventricular internal cavity size was normal in size. There is mild left ventricular hypertrophy. Left ventricular diastolic parameters are consistent with Grade I diastolic dysfunction (impaired relaxation). Right Ventricle: The right ventricular size is normal. No increase in right ventricular wall thickness. Right ventricular systolic function was not well visualized. Left Atrium: Left atrial size was normal in size. Right Atrium: Right atrial size was normal in size. Pericardium: There is no evidence of pericardial effusion. Mitral Valve: The mitral valve is normal in structure. No evidence of mitral valve regurgitation. No evidence of mitral valve stenosis. MV peak gradient, 1.9 mmHg. The mean mitral valve gradient is 1.0 mmHg. Tricuspid  Valve: The tricuspid valve is normal in structure. Tricuspid valve regurgitation is not demonstrated. No evidence of tricuspid stenosis. Aortic Valve: The aortic valve is normal in structure. There is mild calcification of the aortic valve. Aortic valve regurgitation is not visualized. Aortic valve sclerosis/calcification is present, without any evidence of aortic stenosis. Aortic valve mean gradient measures 2.0 mmHg. Aortic valve peak gradient measures 3.2 mmHg. Aortic valve area, by VTI measures 2.35 cm. Pulmonic Valve: The pulmonic valve was normal in structure. Pulmonic valve regurgitation is not visualized. No evidence of pulmonic stenosis. Aorta: The aortic root is normal in size and structure. Venous: The inferior vena cava was not well visualized. IAS/Shunts: No atrial level shunt detected by color flow Doppler. Additional Comments: 3D imaging was not performed.  LEFT VENTRICLE PLAX 2D LVIDd:         4.50 cm   Diastology LVIDs:         3.00 cm   LV e' medial:   4.57 cm/s LV PW:         1.30 cm   LV E/e' medial: 10.4 LV IVS:        1.40 cm LVOT diam:     2.10 cm LV SV:         36 LV SV Index:   16 LVOT Area:     3.46 cm  LEFT ATRIUM           Index LA diam:      5.00 cm 2.15 cm/m LA Vol (A4C): 39.7 ml 17.08 ml/m  AORTIC VALVE AV Area (Vmax):    2.45 cm AV Area (Vmean):   2.37 cm AV Area (VTI):     2.35 cm AV Vmax:           89.80 cm/s AV Vmean:          63.200 cm/s AV VTI:            0.155 m AV Peak Grad:      3.2 mmHg AV Mean Grad:      2.0 mmHg LVOT Vmax:         63.50 cm/s LVOT Vmean:  43.200 cm/s LVOT VTI:          0.105 m LVOT/AV VTI ratio: 0.68  AORTA Ao Root diam: 3.70 cm MITRAL VALVE MV Area (PHT): 3.77 cm    SHUNTS MV Area VTI:   1.93 cm    Systemic VTI:  0.10 m MV Peak grad:  1.9 mmHg    Systemic Diam: 2.10 cm MV Mean grad:  1.0 mmHg MV Vmax:       0.70 m/s MV Vmean:      43.1 cm/s MV Decel Time: 201 msec MV E velocity: 47.30 cm/s MV A velocity: 51.90 cm/s MV E/A ratio:  0.91 Julien Nordmann MD Electronically signed by Julien Nordmann MD Signature Date/Time: 04/07/2023/10:46:13 AM    Final     Cardiac Studies  2D echo 04/07/2023 1. Very challenging images, definity not used   2. Left ventricular ejection fraction, by estimation, is 40 to 45 %. The  left ventricle has mildly decreased function. The left ventricle  demonstrates regional wall motion abnormalities (images suggest  anteroseptal, inferior/posterior and apical  hypokinesis, possibly anterior wall hypokinesis). There is mild left  ventricular hypertrophy. Left ventricular diastolic parameters are  consistent with Grade I diastolic dysfunction (impaired relaxation).   3. Right ventricular systolic function was not well visualized. The right  ventricular size is normal.   4. The mitral valve is normal in structure. No evidence of mitral valve  regurgitation. No evidence of mitral stenosis.   5. The aortic valve is normal in structure. There is mild calcification  of the aortic valve. Aortic valve regurgitation is not visualized. Aortic  valve sclerosis/calcification is present, without any evidence of aortic  stenosis. Aortic valve mean  gradient measures 2.0 mmHg.   Patient Profile     78 y.o. 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  Assessment & Plan    Cardiac arrest/CHB -Witnessed cardiac arrest on 2/11 while in bed been urgent care waiting room he was subsequently transferred to [facility] -CPR started immediately by a bystander -ROSC achieved numerous times but he would keep losing a pulse, defibrillated revealing PEA, asystole, complete heart block -Left heart cath on 211 revealed severe native CAD with patent LIMA-LAD  and saphenous-OM1.  Saphenous-RCA is occluded but did not appear to be acute. -Continues to remain off of pressors -Unfortunately continues to remain intubated and sedated -MRI of the brain revealed 2 small infarcts likely acute findings -Supportive care  Shock -Lactic acid greater than 9 on admission but trended down to 2.8 -Blood cultures negative -Continues to remain off pressors -Continued on antibiotics per CCM  Ischemic cardiomyopathy, HFimpEF -Echocardiogram 40-45% this admission -NYHA IV on admission -CVP 5-6 -Not restarted on furosemide today -Started on BiDil for better blood pressure control as he is hypertensive this morning -Continue to titrate GDMT as tolerated by blood pressure and kidney function  Coronary artery disease -History of NSTEMI in 2019 and now status post CABG x 3 in 2019 -Left heart catheterization 04/05/2023 revealed severe native CAD with patent LIMA-LAD, saphenous/OM1, and SVG-RCA occluded -Continued on aspirin and statin therapies  Acute respiratory failure -Continues to remain intubated and sedated -Previously was hypoxic -Previously was on heparin infusion that was stopped yesterday -Continue management per PCCM  AKI -Serum  creatinine 2.04 -Monitor urine output -Monitor/trend/replete electrolytes as needed -Daily BMP -Avoid nephrotoxic agents were able  Hypokalemia -Serum potassium 2.9 -Potassium supplementations given -Recommend keeping potassium closer to 4 less than 5 -Daily BMP  Hypertension -Blood pressures running as high this morning 185 -Started on half dosing of BiDil for better blood pressure control -Patient has been off of pressors for greater than 24 hours -Vital signs per unit protocol     For questions or updates, please contact [facility] Please consult [email or web address] for contact info under        Signed, Maytte Jacot, NP  04/09/2023, 9:40 AM

## 2023-04-09 NOTE — Consult Note (Signed)
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  He initially presented to [facility] ED via EMS from urgent care following a witnessed cardiac arrest.  Bystander CPR initiated and EMS arrived on scene within 30 seconds.  Initial cardiac rhythm when they arrived was asystole.  ROSC was initially achieved however patient went into PEA/asystole repeatedly prior to arriving at the ED.  Patient was previously evaluated in ER on February 4 with urinary urgency and frequency was started 5 days prior.  He was diagnosed with UTI and prescribed Bactrim DS twice daily for 10 days.  However symptoms persisted which is why he went to urgent care.  When he arrived to the ED he was on a nonrebreather with a pulse.  During chest compressions patient began to fight and pull at medical devices.  While in the ED waiting room patient arrested again requiring ACLS and mechanical intubation.  Patient's rhythm during cardiac arrest very between PEA/asystole/complete heart block and he required transcutaneous pacing.  EKG showed sinus rhythm with complete heart block and ventricular escape rhythm with LBBB morphology.  Total downtime estimated 2 hours from out-of-hospital cardiac arrest and inpatient cardiac arrest.  He was placed on epinephrine drip due to bradycardia and hypotension.  He was taken emergently to the Cath Lab for temporary transvenous  pacemaker placement, emergent cardiac cath, and Swan-Ganz catheter placement.  Cardiac cath revealed severe native CAD with a patent LIMA-LAD and SVG-OM.  SVG to RCA was occluded but they were able to revascularize it.  Patient was admitted to [facility] ICU after that cardiac cath.  When he initially arrived to the ICU patient was able to follow commands but he subsequently stopped doing so. He still opens eyes to voice. MRI brain was performed which showed 2 small acute infarcts in the right frontal cortical region. LKW unknown therefore TNK was not considered.   NIHSS components Score: Comment  1a Level of Conscious 0[]  1[]  2[x]  3[]      1b LOC Questions 0[]  1[]  2[x]       1c LOC Commands 0[]  1[]  2[x]       2 Best Gaze 0[x]  1[]  2[]       3 Visual 0[x]  1[]  2[]  3[]      4 Facial Palsy 0[x]  1[]  2[]  3[]      5a Motor Arm - left 0[]  1[]  2[]  3[]  4[x]  UN[]    5b Motor Arm - Right 0[]  1[]  2[]  3[]  4[x]  UN[]    6a Motor Leg - Left 0[]  1[]  2[]  3[x]  4[]  UN[]    6b Motor Leg - Right 0[]  1[]  2[]  3[x]  4[]  UN[]    7 Limb Ataxia 0[x]  1[]  2[]  3[]  UN[]     8 Sensory 0[]  1[x]  2[]  UN[]      9 Best Language 0[]  1[]  2[]  3[x]      10 Dysarthria 0[]  1[]  2[]  UN[x]      11 Extinct. and Inattention 0[x]  1[]  2[]       TOTAL:  27      ROS   Unable to ascertain due to coma  Past History  Past Medical History:  Diagnosis Date   Alternating RBBB & LBBB    Ankle swelling 2024   Arthritis    neck   Bladder tumor    CAD (coronary artery disease)    a. 08/2017 STEMI/Cath: LM 80ost/d, LAD 100p, LCX 95 ost/p, RCA 60p, 60m, RPDA 80ost; b. 08/2017 CABG x 3: LIMA->LAD, VG->OM, VG->PDA.   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 08/2017 TEE: EF 30-35%, sept/ant/inf HK, apical AK. 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 12/2017 Echo: EF 45-50%, diff HK. Gr1 DD. Mildly reduced RV fxn. Mild RAE; c. 09/2022 Echo: EF 55-60%, mod LVH,  GrI DD, nl RV fxn, mildly dil RA, mild MR.   Hyperlipidemia LDL goal <70    Ischemic cardiomyopathy    a. 08/2017 TEE: EF 30-35%; b. 12/2017 Echo: EF 45-50%; c. 09/2022 Echo: EF 55-60%.   Myocardial infarction (HCC) 08/27/2017   Shingles    2018 - right side   Vertigo    several months ago    Past Surgical History:  Procedure Laterality Date   CARDIAC CATHETERIZATION     CATARACT EXTRACTION W/PHACO Left 07/18/2018   Procedure: CATARACT EXTRACTION PHACO AND INTRAOCULAR LENS PLACEMENT (IOC)  LEFT;  Surgeon: Nevada Crane, MD;  Location: [facility];  Service: Ophthalmology;  Laterality: Left;   CATARACT EXTRACTION W/PHACO Right 08/14/2018   Procedure: CATARACT EXTRACTION PHACO AND INTRAOCULAR LENS PLACEMENT (IOC)  RIGHT;  Surgeon: Nevada Crane, MD;  Location: [facility];  Service: Ophthalmology;  Laterality: Right;   CORONARY ARTERY BYPASS GRAFT N/A 08/27/2017   Procedure: CORONARY ARTERY BYPASS GRAFTING (CABG) ON PUMP USING LEFT INTERNAL MAMMARY ARTERY AND LEFT GREATER SAPHENOUS VEIN VIA ENDOVEIN HARVEST;  Surgeon: Alleen Borne, MD;  Location: [facility] OR;  Service: Open Heart Surgery;  Laterality: N/A;   CORONARY/GRAFT ACUTE MI REVASCULARIZATION N/A 08/27/2017   Procedure: Coronary/Graft Acute MI Revascularization;  Surgeon: Iran Ouch, MD;  Location: [facility] IN[facility];  Service: Cardiovascular;  Laterality: N/A;   CYSTOSCOPY W/ RETROGRADES Bilateral 12/25/2018   Procedure: CYSTOSCOPY WITH RETROGRADE PYELOGRAM;  Surgeon: Vanna Scotland, MD;  Location: [facility] ORS;  Service: Urology;  Laterality: Bilateral;   FRACTURE SURGERY Right 1958   arm and left wrist compound fracture, no metal   HERNIA REPAIR Right    inguinial   IR IMAGING GUIDED PORT INSERTION  10/15/2022   LEFT HEART CATH AND CORONARY ANGIOGRAPHY N/A 08/27/2017   Procedure: LEFT HEART CATH AND CORONARY ANGIOGRAPHY;  Surgeon: Iran Ouch, MD;  Location: [facility] CV [facility];  Service: Cardiovascular;   Laterality: N/A;   RIGHT/LEFT HEART CATH AND CORONARY/GRAFT ANGIOGRAPHY N/A 04/05/2023   Procedure: RIGHT/LEFT HEART CATH AND CORONARY/GRAFT ANGIOGRAPHY;  Surgeon: Yvonne Kendall, MD;  Location: [facility] CV [facility];  Service: Cardiovascular;  Laterality: N/A;   TEMPORARY PACEMAKER N/A 04/05/2023   Procedure: TEMPORARY PACEMAKER;  Surgeon: Yvonne Kendall, MD;  Location: [facility];  Service: Cardiovascular;  Laterality: N/A;   TRANSURETHRAL RESECTION OF BLADDER TUMOR WITH MITOMYCIN-C N/A 12/25/2018   Procedure: TRANSURETHRAL RESECTION OF BLADDER TUMOR WITH Gemcitabine;  Surgeon: Vanna Scotland, MD;  Location: [facility] ORS;  Service: Urology;  Laterality: N/A;    Family History: Family History  Problem Relation Age of Onset   Heart failure Mother    Lung disease Father    Stroke  Father    Cervical cancer Maternal Grandmother     Social History  reports that he quit smoking about 25 years ago. His smoking use included cigarettes. His smokeless tobacco use includes snuff. He reports that he does not currently use alcohol. He reports that he does not currently use drugs.  No Known Allergies  Medications   Current Facility-Administered Medications:    0.9 %  sodium chloride infusion (Manually program via Guardrails IV Fluids), , Intravenous, Continuous, End, Christopher, MD, Last Rate: 10 mL/hr at 04/09/23 1800, Infusion Verify at 04/09/23 1800   0.9 %  sodium chloride infusion (Manually program via Guardrails IV Fluids), , Intravenous, PRN, End, Cristal Deer, MD   acetaminophen (TYLENOL) tablet 650 mg, 650 mg, Per Tube, Q4H PRN, Madueme, Elvira C, RPH   acetaminophen (TYLENOL) tablet 650 mg, 650 mg, Per Tube, Q6H, Assaker, Jean-Pierre, MD, 650 mg at 04/09/23 1738   aspirin chewable tablet 81 mg, 81 mg, Per Tube, Daily, Madueme, Elvira C, RPH, 81 mg at 04/09/23 0949   atorvastatin (LIPITOR) tablet 80 mg, 80 mg, Per Tube, Daily, Brynda Peon L, NP, 80 mg at 04/09/23 4010    Chlorhexidine Gluconate Cloth 2 % PADS 6 each, 6 each, Topical, Daily, End, Christopher, MD, 6 each at 04/09/23 1618   docusate (COLACE) 50 MG/5ML liquid 100 mg, 100 mg, Per Tube, BID, Ezequiel Essex, NP, 100 mg at 04/09/23 0949   famotidine (PEPCID) IVPB 20 mg premix, 20 mg, Intravenous, Q24H, Ezequiel Essex, NP, Last Rate: 100 mL/hr at 04/09/23 1834, 20 mg at 04/09/23 1834   feeding supplement (PROSource TF20) liquid 60 mL, 60 mL, Per Tube, BID, Assaker, Jean-Pierre, MD, 60 mL at 04/09/23 1004   feeding supplement (VITAL HIGH PROTEIN) liquid 1,000 mL, 1,000 mL, Per Tube, Continuous, Assaker, Jean-Pierre, MD, Last Rate: 60 mL/hr at 04/09/23 1800, Infusion Verify at 04/09/23 1800   free water 30 mL, 30 mL, Per Tube, Q4H, Assaker, Jean-Pierre, MD, 30 mL at 04/09/23 1838   heparin injection 5,000 Units, 5,000 Units, Subcutaneous, Q8H, Assaker, Jean-Pierre, MD, 5,000 Units at 04/09/23 1352   insulin aspart (novoLOG) injection 0-6 Units, 0-6 Units, Subcutaneous, Q4H, Ezequiel Essex, NP, 1 Units at 04/07/23 0331   ipratropium-albuterol (DUONEB) 0.5-2.5 (3) MG/3ML nebulizer solution 3 mL, 3 mL, Nebulization, Q6H PRN, Ezequiel Essex, NP   isosorbide-hydrALAZINE (BIDIL) 20-37.5 MG per tablet 0.5 tablet, 0.5 tablet, Oral, Q6H, Gollan, Tollie Pizza, MD, 0.5 tablet at 04/09/23 1636   lidocaine (LIDODERM) 5 % 1 patch, 1 patch, Transdermal, Q24H, Assaker, Jean-Pierre, MD, 1 patch at 04/09/23 1120   ondansetron (ZOFRAN) injection 4 mg, 4 mg, Intravenous, Q6H PRN, End, Christopher, MD   Oral care mouth rinse, 15 mL, Mouth Rinse, Q2H, End, Christopher, MD, 15 mL at 04/09/23 2017   Oral care mouth rinse, 15 mL, Mouth Rinse, PRN, End, Cristal Deer, MD   oxyCODONE (Oxy IR/ROXICODONE) immediate release tablet 10 mg, 10 mg, Per Tube, Q6H, Assaker, Jean-Pierre, MD, 10 mg at 04/09/23 1738   piperacillin-tazobactam (ZOSYN) IVPB 3.375 g, 3.375 g, Intravenous, Q8H, Assaker, Jean-Pierre, MD, Stopped at 04/09/23 1753   polyethylene  glycol (MIRALAX / GLYCOLAX) packet 17 g, 17 g, Per Tube, Daily, Ezequiel Essex, NP, 17 g at 04/09/23 0949   propofol (DIPRIVAN) 1000 MG/100ML infusion, 5-80 mcg/kg/min, Intravenous, Titrated, Dahlia Byes, NP, Last Rate: 12.59 mL/hr at 04/09/23 2038, 20 mcg/kg/min at 04/09/23 2038   sodium chloride flush (NS) 0.9 % injection 10-40 mL, 10-40 mL, Intracatheter, Q12H, End, Cristal Deer,  MD, 10 mL at 04/09/23 1004   sodium chloride flush (NS) 0.9 % injection 10-40 mL, 10-40 mL, Intracatheter, PRN, End, Cristal Deer, MD   sodium chloride flush (NS) 0.9 % injection 10-40 mL, 10-40 mL, Intracatheter, Q12H, Bensimhon, Bevelyn Buckles, MD, 10 mL at 04/09/23 1004   sodium chloride flush (NS) 0.9 % injection 10-40 mL, 10-40 mL, Intracatheter, PRN, Bensimhon, Bevelyn Buckles, MD  Facility-Administered Medications Ordered in Other Encounters:    sodium chloride flush (NS) 0.9 % injection 10 mL, 10 mL, Intravenous, PRN, Louretta Shorten R, MD   sodium chloride flush (NS) 0.9 % injection 10 mL, 10 mL, Intravenous, PRN, Louretta Shorten R, MD, 10 mL at 11/30/22 1510  Vitals   Vitals:   04/09/23 1830 04/09/23 1900 04/09/23 2000 04/09/23 2036  BP:      Pulse: 99 100 (!) 102 100  Resp: (!) 21 20 18  (!) 22  Temp:    98.7 F (37.1 C)  TempSrc:    Axillary  SpO2: 97% 97% 96% 96%  Weight:      Height:        Body mass index is 31.16 kg/m.  Physical Exam   Gen: comatose lying in bed CV: RRR Resp: ventilated  Neurologic Examination   MS: opens eyes to loud voice, does not attend to or track examiner, followed one simple command (wiggle toes) but no others Speech: intubated CN: Pupils 3mm ERRL, (+) corneals, oculocephalics, cough, gage, face symmetric at rest Motor & sensory: No movement to noxious stimuli BUE, withdrawal to noxious stimuli BLE symmetric.  Labs/Imaging/Neurodiagnostic studies   CBC:  Recent Labs  Lab 04/12/23 1743 04/06/23 0316 04/08/23 0412 04/09/23 0413  WBC 29.0*   < > 24.0*  15.9*  NEUTROABS 25.2*  --   --   --   HGB 12.8*   < > 8.5* 8.4*  HCT 37.3*   < > 25.2* 24.5*  MCV 98.4   < > 99.2 102.1*  PLT 277   < > 132* 128*   < > = values in this interval not displayed.   Basic Metabolic Panel:  Lab Results  Component Value Date   NA 142 04/09/2023   K 3.6 04/09/2023   CO2 29 04/09/2023   GLUCOSE 107 (H) 04/09/2023   BUN 57 (H) 04/09/2023   CREATININE 2.04 (H) 04/09/2023   CALCIUM 8.3 (L) 04/09/2023   GFRNONAA 33 (L) 04/09/2023   GFRAA >60 12/21/2018   Lipid Panel:  Lab Results  Component Value Date   LDLCALC 25 04/07/2023   HgbA1c:  Lab Results  Component Value Date   HGBA1C 5.9 (H) 04-12-2023   Urine Drug Screen: No results found for: "LABOPIA", "COCAINSCRNUR", "LABBENZ", "AMPHETMU", "THCU", "LABBARB"  Alcohol Level No results found for: "ETH" INR  Lab Results  Component Value Date   INR 1.4 (H) April 12, 2023   APTT  Lab Results  Component Value Date   APTT 37 (H) 2023-04-12   AED levels: No results found for: "PHENYTOIN", "ZONISAMIDE", "LAMOTRIGINE", "LEVETIRACETA"  MRI Brain(Personally reviewed): 1. 2 small acute infarctions affecting the cortical brain in the right frontal region. 2. Mild chronic small-vessel ischemic change of the cerebral hemispheric white matter with a few old small cortical infarctions scattered about the right hemisphere suggesting chronic/recurrent micro embolic infarctions. Recommend carotid evaluation.  TTE 1. Very challenging images, definity not used 2. Left ventricular ejection fraction, by estimation, is 40 to 45 % . The left ventricle has mildly decreased function. The left ventricle demonstrates regional wall motion  abnormalities ( images suggest anteroseptal, inferior/ posterior and apical hypokinesis, possibly anterior wall hypokinesis) . There is mild left ventricular hypertrophy. Left ventricular diastolic parameters are consistent with Grade I diastolic dysfunction ( impaired relaxation) . 3. Right  ventricular systolic function was not well visualized. The right ventricular size is normal. 4. The mitral valve is normal in structure. No evidence of mitral valve regurgitation. No evidence of mitral stenosis. 5. The aortic valve is normal in structure. There is mild calcification of the aortic valve. Aortic valve regurgitation is not visualized. Aortic valve sclerosis/ calcification is present, without any evidence of aortic stenosis. Aortic valve mean gradient measures 2. 0 mmHg.  Stroke Labs     Component Value Date/Time   CHOL 69 04/07/2023 0758   CHOL 93 (L) 04/01/2021 0926   TRIG 118 04/09/2023 0711   HDL 21 (L) 04/07/2023 0758   HDL 30 (L) 04/01/2021 0926   CHOLHDL 3.3 04/07/2023 0758   VLDL 23 04/07/2023 0758   LDLCALC 25 04/07/2023 0758   LDLCALC 45 04/01/2021 0926   LDLDIRECT 58 12/28/2017 1431   LABVLDL 18 04/01/2021 0926    Lab Results  Component Value Date/Time   HGBA1C 5.9 (H) 04/05/2023 07:38 PM     ASSESSMENT   This is a 78 year old gentleman with past medical history significant for CAD status post CABG times 04/2017, alternating left and right bundle-branch blocks, hyperlipidemia, hypertension, heart failure, 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  He presented with recurrent cardiac arrest lasting 2 hrs.   He was taken emergently to the Cath Lab for temporary transvenous pacemaker placement, emergent cardiac cath, and Swan-Ganz catheter placement.  Cardiac cath revealed severe native CAD with a patent LIMA-LAD and SVG-OM.  SVG to RCA was occluded but they were able to revascularize it.  Patient was admitted to [facility] ICU after that cardiac cath.  When he initially arrived to the ICU patient was able to follow commands but he subsequently stopped doing so. He still opens eyes to voice. On my exam he opens eyes to loud voice but does not attend to  or track examiner. Brainstem reflexes all intact. He was able to follow commands on the appropriate sides but only the command wiggle your toes. No movement BLE to noxious stimuli.  The fact that he was initially following commands after the arrests and the fact that brainstem reflexes are all intact and he may have responded to wiggle your toes command today are encouraging. His brain MRI shows no definitive signs of anoxic damage. We have decided to leave him off sedation until tmrw if he tolerates it; neuro exam off sedation will be our most important tool to inform prognostication. Will order rEEG for Mon as well.  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 He also has remote infarcts in the right hemisphere only suggesting R carotid may be an embolic source. Cerebrovascular imaging is pending.  RECOMMENDATIONS   - ASA 81mg  daily + plavix 75mg  daily x21 days f/b ASA 81mg  daily monotherapy after that. Will check with cardiology to see if plavix monotherapy can be substituted for aspirin monotherapy after 21 days (the current stroke is an aspirin failure) - Continue atorvastatin 80mg  daily; LDL is at goal - CTA or MRA H&N when patient stable to undergo; attn to R carotid as source of embolism - rEEG Mon - Findings and plan for wake-up  exam d/w sister at bedside  This patient is critically ill and at significant risk of neurological worsening, death and care requires constant monitoring of vital signs, hemodynamics,respiratory and cardiac monitoring, neurological assessment, discussion with family, other specialists and medical decision making of high complexity. I spent 65 minutes of neurocritical care time in the care of  this patient. This was time spent independent of any time provided by nurse practitioner or PA.  Bing Neighbors, MD [facility] [phone]  If 7pm- 7am, please page neurology on call as listed in AMION.

## 2023-04-10 ENCOUNTER — Inpatient Hospital Stay: Payer: Medicare Other

## 2023-04-10 DIAGNOSIS — N179 Acute kidney failure, unspecified: Secondary | ICD-10-CM | POA: Diagnosis not present

## 2023-04-10 DIAGNOSIS — E875 Hyperkalemia: Secondary | ICD-10-CM | POA: Diagnosis not present

## 2023-04-10 DIAGNOSIS — J9601 Acute respiratory failure with hypoxia: Secondary | ICD-10-CM

## 2023-04-10 DIAGNOSIS — G9341 Metabolic encephalopathy: Secondary | ICD-10-CM | POA: Diagnosis not present

## 2023-04-10 DIAGNOSIS — I469 Cardiac arrest, cause unspecified: Secondary | ICD-10-CM | POA: Diagnosis not present

## 2023-04-10 DIAGNOSIS — R579 Shock, unspecified: Secondary | ICD-10-CM | POA: Diagnosis not present

## 2023-04-10 HISTORY — DX: Acute respiratory failure with hypoxia: J96.01

## 2023-04-10 LAB — BLOOD GAS, ARTERIAL
Acid-Base Excess: 7.1 mmol/L — ABNORMAL HIGH (ref 0.0–2.0)
Acid-Base Excess: 5.9 mmol/L — ABNORMAL HIGH (ref 0.0–2.0)
Bicarbonate: 30.1 mmol/L — ABNORMAL HIGH (ref 20.0–28.0)
Bicarbonate: 29.7 mmol/L — ABNORMAL HIGH (ref 20.0–28.0)
FIO2: 40 %
FIO2: 50 %
MECHVT: 500 mL
O2 Saturation: 98.6 %
O2 Saturation: 100 %
PEEP: 8 cmH2O
PEEP: 5 cmH2O
Patient temperature: 37
Patient temperature: 37
RATE: 24 {breaths}/min
RATE: 22 {breaths}/min
Spontaneous VT: 500 mL
pCO2 arterial: 36 mm[Hg] (ref 32–48)
pCO2 arterial: 39 mm[Hg] (ref 32–48)
pH, Arterial: 7.53 — ABNORMAL HIGH (ref 7.35–7.45)
pH, Arterial: 7.49 — ABNORMAL HIGH (ref 7.35–7.45)
pO2, Arterial: 74 mm[Hg] — ABNORMAL LOW (ref 83–108)
pO2, Arterial: 103 mm[Hg] (ref 83–108)

## 2023-04-10 LAB — COOXEMETRY PANEL
Carboxyhemoglobin: 2.4 % — ABNORMAL HIGH (ref 0.5–1.5)
Methemoglobin: 1.1 % (ref 0.0–1.5)
O2 Saturation: 98.1 %
Total hemoglobin: 9 g/dL — ABNORMAL LOW (ref 12.0–16.0)
Total oxygen content: 94.7 %

## 2023-04-10 LAB — CBC
HCT: 25.6 % — ABNORMAL LOW (ref 39.0–52.0)
Hemoglobin: 8.8 g/dL — ABNORMAL LOW (ref 13.0–17.0)
MCH: 34.1 pg — ABNORMAL HIGH (ref 26.0–34.0)
MCHC: 34.4 g/dL (ref 30.0–36.0)
MCV: 99.2 fL (ref 80.0–100.0)
Platelets: 121 10*3/uL — ABNORMAL LOW (ref 150–400)
RBC: 2.58 MIL/uL — ABNORMAL LOW (ref 4.22–5.81)
RDW: 12.2 % (ref 11.5–15.5)
WBC: 9.6 10*3/uL (ref 4.0–10.5)
nRBC: 0.2 % (ref 0.0–0.2)

## 2023-04-10 LAB — BASIC METABOLIC PANEL
Anion gap: 11 (ref 5–15)
BUN: 59 mg/dL — ABNORMAL HIGH (ref 8–23)
CO2: 26 mmol/L (ref 22–32)
Calcium: 8.2 mg/dL — ABNORMAL LOW (ref 8.9–10.3)
Chloride: 109 mmol/L (ref 98–111)
Creatinine, Ser: 1.72 mg/dL — ABNORMAL HIGH (ref 0.61–1.24)
GFR, Estimated: 41 mL/min — ABNORMAL LOW (ref >60)
Glucose, Bld: 117 mg/dL — ABNORMAL HIGH (ref 70–99)
Potassium: 2.6 mmol/L — CL (ref 3.5–5.1)
Sodium: 146 mmol/L — ABNORMAL HIGH (ref 135–145)

## 2023-04-10 LAB — GLUCOSE, CAPILLARY
Glucose-Capillary: 101 mg/dL — ABNORMAL HIGH (ref 70–99)
Glucose-Capillary: 72 mg/dL (ref 70–99)
Glucose-Capillary: 81 mg/dL (ref 70–99)
Glucose-Capillary: 93 mg/dL (ref 70–99)
Glucose-Capillary: 94 mg/dL (ref 70–99)
Glucose-Capillary: 98 mg/dL (ref 70–99)

## 2023-04-10 LAB — BRAIN NATRIURETIC PEPTIDE: B Natriuretic Peptide: 437.6 pg/mL — ABNORMAL HIGH (ref 0.0–100.0)

## 2023-04-10 LAB — PHOSPHORUS: Phosphorus: 2.9 mg/dL (ref 2.5–4.6)

## 2023-04-10 LAB — MAGNESIUM: Magnesium: 2 mg/dL (ref 1.7–2.4)

## 2023-04-10 LAB — POTASSIUM: Potassium: 3.1 mmol/L — ABNORMAL LOW (ref 3.5–5.1)

## 2023-04-10 MED ORDER — FENTANYL CITRATE (PF) 100 MCG/2ML IJ SOLN: 100.0000 ug | Freq: Once | INTRAMUSCULAR | Status: AC

## 2023-04-10 MED ORDER — CHLORHEXIDINE GLUCONATE CLOTH 2 % EX PADS
6.0000 | MEDICATED_PAD | Freq: Every day | CUTANEOUS | Status: DC
  Administered 2023-04-11 – 2023-04-20 (×10): 6 via TOPICAL

## 2023-04-10 MED ORDER — NOREPINEPHRINE 16 MG/250ML-% IV SOLN
0.0000 ug/min | INTRAVENOUS | Status: DC
  Administered 2023-04-10: 2 ug/min via INTRAVENOUS
  Filled 2023-04-10: qty 250

## 2023-04-10 MED ORDER — HYDRALAZINE HCL 20 MG/ML IJ SOLN
10.0000 mg | INTRAMUSCULAR | Status: DC | PRN
  Administered 2023-04-10 – 2023-04-14 (×4): 10 mg via INTRAVENOUS
  Filled 2023-04-10 (×4): qty 1

## 2023-04-10 MED ORDER — POTASSIUM CHLORIDE 10 MEQ/50ML IV SOLN
10.0000 meq | INTRAVENOUS | Status: AC
  Administered 2023-04-10 (×6): 10 meq via INTRAVENOUS
  Filled 2023-04-10 (×6): qty 50

## 2023-04-10 MED ORDER — POTASSIUM CHLORIDE 20 MEQ PO PACK
40.0000 meq | PACK | Freq: Once | ORAL | Status: AC
  Administered 2023-04-10: 40 meq
  Filled 2023-04-10: qty 2

## 2023-04-10 MED ORDER — POTASSIUM CHLORIDE 20 MEQ PO PACK
40.0000 meq | PACK | ORAL | Status: AC
  Administered 2023-04-10 (×2): 40 meq
  Filled 2023-04-10 (×2): qty 2

## 2023-04-10 MED ORDER — CLOPIDOGREL BISULFATE 75 MG PO TABS
75.0000 mg | ORAL_TABLET | Freq: Every day | ORAL | Status: DC
Start: 2023-04-10 — End: 2023-05-01
  Administered 2023-04-10 – 2023-04-21 (×10): 75 mg
  Filled 2023-04-10 (×10): qty 1

## 2023-04-10 MED ORDER — FENTANYL CITRATE (PF) 100 MCG/2ML IJ SOLN
INTRAMUSCULAR | Status: AC
Start: 2023-04-10 — End: 2023-04-10
  Administered 2023-04-10: 100 ug via INTRAVENOUS
  Filled 2023-04-10: qty 2

## 2023-04-10 NOTE — Progress Notes (Incomplete)
NEUROLOGY CONSULT FOLLOW UP NOTE   Date of service: April 10, 2023 Patient Name: Cory Hess MRN:  [id number] DOB:  [birth year]  Interval Hx/subjective    Vitals   Vitals:   04/10/23 1700 04/10/23 1730 04/10/23 1800 04/10/23 1830  BP:      Pulse: (!) 124 (!) 123 (!) 123 (!) 116  Resp: (!) 33 (!) 32 (!) 29 (!) 23  Temp:      TempSrc:      SpO2: 92% 91% 92% 93%  Weight:      Height:         Body mass index is 31.31 kg/m.  Physical Exam   Constitutional: Appears well-developed and well-nourished. *** Psych: Affect appropriate to situation. *** Eyes: No scleral injection. *** HENT: No OP obstrucion. *** Head: Normocephalic. *** Cardiovascular: Normal rate and regular rhythm. *** Respiratory: Effort normal, non-labored breathing. *** GI: Soft.  No distension. There is no tenderness. *** Skin: WDI. ***  Neurologic Examination   ***  Medications  Current Facility-Administered Medications:    0.9 %  sodium chloride infusion (Manually program via Guardrails IV Fluids), , Intravenous, Continuous, End, Cristal Deer, MD, Last Rate: 10 mL/hr at 04/10/23 1900, Infusion Verify at 04/10/23 1900   0.9 %  sodium chloride infusion (Manually program via Guardrails IV Fluids), , Intravenous, PRN, End, Cristal Deer, MD   acetaminophen (TYLENOL) tablet 650 mg, 650 mg, Per Tube, Q4H PRN, Madueme, Elvira C, RPH   acetaminophen (TYLENOL) tablet 650 mg, 650 mg, Per Tube, Q6H, Assaker, [place], MD, 650 mg at 04/10/23 1743   aspirin chewable tablet 81 mg, 81 mg, Per Tube, Daily, Madueme, Elvira C, RPH, 81 mg at 04/10/23 0935   atorvastatin (LIPITOR) tablet 80 mg, 80 mg, Per Tube, Daily, Brynda Peon L, NP, 80 mg at 04/10/23 0935   Chlorhexidine Gluconate Cloth 2 % PADS 6 each, 6 each, Topical, Daily, End, Christopher, MD, 6 each at 04/09/23 1618   clopidogrel (PLAVIX) tablet 75 mg, 75 mg, Per Tube, Daily, Assaker, [place], MD, 75 mg at 04/10/23 1113   docusate (COLACE) 50 MG/5ML liquid  100 mg, 100 mg, Per Tube, BID, Ezequiel Essex, NP, 100 mg at 04/10/23 0935   famotidine (PEPCID) IVPB 20 mg premix, 20 mg, Intravenous, Q24H, Ezequiel Essex, NP, Stopped at 04/10/23 1857   feeding supplement (PROSource TF20) liquid 60 mL, 60 mL, Per Tube, BID, Assaker, [place], MD, 60 mL at 04/10/23 0935   feeding supplement (VITAL HIGH PROTEIN) liquid 1,000 mL, 1,000 mL, Per Tube, Continuous, Assaker, [place], MD, Last Rate: 60 mL/hr at 04/10/23 1900, Infusion Verify at 04/10/23 1900   free water 30 mL, 30 mL, Per Tube, Q4H, Assaker, Jean-Pierre, MD, 30 mL at 04/10/23 1843   heparin injection 5,000 Units, 5,000 Units, Subcutaneous, Q8H, Assaker, Jean-Pierre, MD, 5,000 Units at 04/10/23 1352   hydrALAZINE (APRESOLINE) injection 10 mg, 10 mg, Intravenous, Q2H PRN, Hammock, Sheri, NP, 10 mg at 04/10/23 1609   insulin aspart (novoLOG) injection 0-6 Units, 0-6 Units, Subcutaneous, Q4H, Ezequiel Essex, NP, 1 Units at 04/07/23 0331   ipratropium-albuterol (DUONEB) 0.5-2.5 (3) MG/3ML nebulizer solution 3 mL, 3 mL, Nebulization, Q6H PRN, Ezequiel Essex, NP   isosorbide-hydrALAZINE (BIDIL) 20-37.5 MG per tablet 0.5 tablet, 0.5 tablet, Per Tube, Q6H, Nazari, Walid A, RPH, 0.5 tablet at 04/10/23 1604   lidocaine (LIDODERM) 5 % 1 patch, 1 patch, Transdermal, Q24H, Assaker, Jean-Pierre, MD, 1 patch at 04/10/23 1113   norepinephrine (LEVOPHED) 16 mg in (0.064 mg/mL) premix  infusion, 0-40 mcg/min, Intravenous, Titrated, Ezequiel Essex, NP, Last Rate: 1.88 mL/hr at 04/10/23 1900, 2 mcg/min at 04/10/23 1900   ondansetron (ZOFRAN) injection 4 mg, 4 mg, Intravenous, Q6H PRN, End, Cristal Deer, MD   Oral care mouth rinse, 15 mL, Mouth Rinse, Q2H, End, Cristal Deer, MD, 15 mL at 04/10/23 1743   Oral care mouth rinse, 15 mL, Mouth Rinse, PRN, End, Cristal Deer, MD   oxyCODONE (Oxy IR/ROXICODONE) immediate release tablet 10 mg, 10 mg, Per Tube, Q6H, Assaker, [place], MD, 10 mg at 04/10/23 1743    piperacillin-tazobactam (ZOSYN) IVPB 3.375 g, 3.375 g, Intravenous, Q8H, Assaker, Jean-Pierre, MD, Paused at 04/10/23 1752   polyethylene glycol (MIRALAX / GLYCOLAX) packet 17 g, 17 g, Per Tube, Daily, Ezequiel Essex, NP, 17 g at 04/10/23 0935   potassium chloride (KLOR-CON) packet 40 mEq, 40 mEq, Per Tube, Q4HBarrie Folk, RPH, 40 mEq at 04/10/23 1608   propofol (DIPRIVAN) 1000 MG/100ML infusion, 5-80 mcg/kg/min, Intravenous, Titrated, Dahlia Byes, NP, Last Rate: 25.2 mL/hr at 04/10/23 1643, 40 mcg/kg/min at 04/10/23 1643   sodium chloride flush (NS) 0.9 % injection 10-40 mL, 10-40 mL, Intracatheter, Q12H, End, Christopher, MD, 10 mL at 04/10/23 0936   sodium chloride flush (NS) 0.9 % injection 10-40 mL, 10-40 mL, Intracatheter, PRN, End, Cristal Deer, MD   sodium chloride flush (NS) 0.9 % injection 10-40 mL, 10-40 mL, Intracatheter, Q12H, Bensimhon, Bevelyn Buckles, MD, 10 mL at 04/10/23 0936   sodium chloride flush (NS) 0.9 % injection 10-40 mL, 10-40 mL, Intracatheter, PRN, Bensimhon, Bevelyn Buckles, MD  Facility-Administered Medications Ordered in Other Encounters:    sodium chloride flush (NS) 0.9 % injection 10 mL, 10 mL, Intravenous, PRN, Louretta Shorten R, MD   sodium chloride flush (NS) 0.9 % injection 10 mL, 10 mL, Intravenous, PRN, Earna Coder, MD, 10 mL at 11/30/22 1510  Labs and Diagnostic Imaging   CBC:  Recent Labs  Lab 04/05/23 1743 04/06/23 0316 04/09/23 0413 04/10/23 0421  WBC 29.0*   < > 15.9* 9.6  NEUTROABS 25.2*  --   --   --   HGB 12.8*   < > 8.4* 8.8*  HCT 37.3*   < > 24.5* 25.6*  MCV 98.4   < > 102.1* 99.2  PLT 277   < > 128* 121*   < > = values in this interval not displayed.    Basic Metabolic Panel:  Lab Results  Component Value Date   NA 146 (H) 04/10/2023   K 3.1 (L) 04/10/2023   CO2 26 04/10/2023   GLUCOSE 117 (H) 04/10/2023   BUN 59 (H) 04/10/2023   CREATININE 1.72 (H) 04/10/2023   CALCIUM 8.2 (L) 04/10/2023   GFRNONAA 41 (L)  04/10/2023   GFRAA >60 12/21/2018   Lipid Panel:  Lab Results  Component Value Date   LDLCALC 25 04/07/2023   HgbA1c:  Lab Results  Component Value Date   HGBA1C 5.9 (H) 04/05/2023   Urine Drug Screen: No results found for: "LABOPIA", "COCAINSCRNUR", "LABBENZ", "AMPHETMU", "THCU", "LABBARB"  Alcohol Level No results found for: "ETH" INR  Lab Results  Component Value Date   INR 1.4 (H) 04/05/2023   APTT  Lab Results  Component Value Date   APTT 37 (H) 04/05/2023   AED levels: No results found for: "PHENYTOIN", "ZONISAMIDE", "LAMOTRIGINE", "LEVETIRACETA"  CT Head without contrast(Personally reviewed): ***  CT angio Head and Neck with contrast(Personally reviewed): ***  MR Angio head without contrast and Carotid Duplex BL(Personally reviewed): ***  MRI Brain(Personally reviewed): ***  rEEG:  ***  Assessment   Tracie Gola is a 78 y.o. male ***  Recommendations  *** ______________________________________________________________________   Signed, Jefferson Fuel, MD [facility]

## 2023-04-10 NOTE — Progress Notes (Signed)
Pt awake and able to follow commands, however unable to tolerate SBT @5 /5 and FiO2 40%.  Pt with increased work of breath respiratory rate mid 30's to low 40's with accessory muscle use.  Asked pt if he was having a hard time breathing he nodded his head yes although responses delayed.  Pt placed back on full support.  Will continue to monitor and assess pt.    Zada Girt, AGNP  Pulmonary/Critical Care Pager [phone] (please enter 7 digits) PCCM Consult Pager [phone] (please enter 7 digits)

## 2023-04-10 NOTE — Progress Notes (Signed)
PHARMACY CONSULT NOTE - FOLLOW UP  Pharmacy Consult for Electrolyte Monitoring and Replacement   Recent Labs: Potassium (mmol/L)  Date Value  04/10/2023 2.6 (LL)   Magnesium (mg/dL)  Date Value  16/11/9602 2.0   Calcium (mg/dL)  Date Value  54/10/8117 8.2 (L)   Albumin (g/dL)  Date Value  14/78/2956 2.4 (L)  04/01/2021 4.2   Phosphorus (mg/dL)  Date Value  21/30/8657 2.9   Sodium (mmol/L)  Date Value  04/10/2023 146 (H)  10/05/2022 141   Assessment: DC is a 78 yo male who presented to [facility] ED post cardiac arrest. Their downtime was 2 hours. Pharmacy has been consult to manage this patient's electrolytes while in the ICU.   Fluids: Norepi + propofol + vaso Nutrition: Vital high protein 45mL/hr + free water 30 mL Q4H Pertinent Medications:   Goal of Therapy:  WNL  Plan:  Medical team ordered Kcl 10 mEq IV x 6.  F/u with K+ at 1500 and AM labs.   Ronnald Ramp, PharmD 04/10/2023 8:18 AM

## 2023-04-10 NOTE — Plan of Care (Signed)
  Problem: Education: Goal: Knowledge of General Education information will improve Description: Including pain rating scale, medication(s)/side effects and non-pharmacologic comfort measures Outcome: Not Progressing   Problem: Health Behavior/Discharge Planning: Goal: Ability to manage health-related needs will improve Outcome: Not Progressing   Problem: Clinical Measurements: Goal: Ability to maintain clinical measurements within normal limits will improve Outcome: Not Progressing Goal: Will remain free from infection Outcome: Not Progressing Goal: Diagnostic test results will improve Outcome: Not Progressing Goal: Respiratory complications will improve Outcome: Not Progressing Goal: Cardiovascular complication will be avoided Outcome: Not Progressing   Problem: Activity: Goal: Risk for activity intolerance will decrease Outcome: Not Progressing   Problem: Nutrition: Goal: Adequate nutrition will be maintained Outcome: Not Progressing   Problem: Coping: Goal: Level of anxiety will decrease Outcome: Not Progressing   Problem: Elimination: Goal: Will not experience complications related to bowel motility Outcome: Not Progressing Goal: Will not experience complications related to urinary retention Outcome: Not Progressing   Problem: Pain Managment: Goal: General experience of comfort will improve and/or be controlled Outcome: Not Progressing   Problem: Safety: Goal: Ability to remain free from injury will improve Outcome: Not Progressing   Problem: Skin Integrity: Goal: Risk for impaired skin integrity will decrease Outcome: Not Progressing   Problem: Education: Goal: Understanding of CV disease, CV risk reduction, and recovery process will improve Outcome: Not Progressing   Problem: Activity: Goal: Ability to return to baseline activity level will improve Outcome: Not Progressing   Problem: Cardiovascular: Goal: Ability to achieve and maintain adequate  cardiovascular perfusion will improve Outcome: Not Progressing Goal: Vascular access site(s) Level 0-1 will be maintained Outcome: Not Progressing   Problem: Health Behavior/Discharge Planning: Goal: Ability to safely manage health-related needs after discharge will improve Outcome: Not Progressing   Problem: Education: Goal: Understanding of CV disease, CV risk reduction, and recovery process will improve Outcome: Not Progressing   Problem: Activity: Goal: Ability to return to baseline activity level will improve Outcome: Not Progressing   Problem: Cardiovascular: Goal: Ability to achieve and maintain adequate cardiovascular perfusion will improve Outcome: Not Progressing Goal: Vascular access site(s) Level 0-1 will be maintained Outcome: Not Progressing   Problem: Health Behavior/Discharge Planning: Goal: Ability to safely manage health-related needs after discharge will improve Outcome: Not Progressing   Problem: Education: Goal: Ability to describe self-care measures that may prevent or decrease complications (Diabetes Survival Skills Education) will improve Outcome: Not Progressing   Problem: Coping: Goal: Ability to adjust to condition or change in health will improve Outcome: Not Progressing   Problem: Fluid Volume: Goal: Ability to maintain a balanced intake and output will improve Outcome: Not Progressing   Problem: Health Behavior/Discharge Planning: Goal: Ability to identify and utilize available resources and services will improve Outcome: Not Progressing Goal: Ability to manage health-related needs will improve Outcome: Not Progressing   Problem: Metabolic: Goal: Ability to maintain appropriate glucose levels will improve Outcome: Not Progressing   Problem: Nutritional: Goal: Maintenance of adequate nutrition will improve Outcome: Not Progressing Goal: Progress toward achieving an optimal weight will improve Outcome: Not Progressing   Problem: Skin  Integrity: Goal: Risk for impaired skin integrity will decrease Outcome: Not Progressing   Problem: Tissue Perfusion: Goal: Adequacy of tissue perfusion will improve Outcome: Not Progressing

## 2023-04-10 NOTE — Progress Notes (Signed)
0730 patient on vent with sedation unable to make needs known responds to some stimuli.

## 2023-04-10 NOTE — Progress Notes (Signed)
Pt w/ increased WOB after turn. Propofol increased per orders. Increased WOB continued. Pt then with desat episode, pulse ox reading in the 50's. Provider at bedside. BVM used without difficulties, though diminished lung sounds noted per provider. Increased Propofol again. Fentanyl x1 administered per orders. Provider adjusted ventilator setting. Stat CXR ordered.

## 2023-04-10 NOTE — Progress Notes (Signed)
Updated pts sister along with another family member at bedside regarding pts condition and all questions were answered.  Will continue to monitor and assess pt.    Zada Girt, AGNP  Pulmonary/Critical Care Pager [phone] (please enter 7 digits) PCCM Consult Pager [phone] (please enter 7 digits)

## 2023-04-10 NOTE — Plan of Care (Signed)
Neurology plan of care  Please see neurology consult note from 04/09/23 for full findings and recommendations. Neurology was consulted for prognostication after cardiac arrest as well as stroke workup.  Regarding prognostication, his MRI brain shows no definitive signs of anoxic damage.  He wakes up to voice although initially was not following commands.  Brainstem reflexes have been intact throughout.  The plan yesterday was to hold sedation for 24 hours to give a clean neurologic exam to see what he was able to do.  Sedation was held yesterday early afternoon and over the next few hours he did start to follow commands consistently.  Unfortunately he failed his SBT and had to be resedated.  Patient was not seen today because the most important remaining tools for neuro to help inform prognostication, exam off sedation, and EEG are not available options today.  We will try again tomorrow for an exam off sedation.  Routine EEG is scheduled for tomorrow as well.  Updated recommendations: - ASA 81mg  daily + plavix 75mg  daily x21 days f/b ASA 81mg  daily monotherapy after that. 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 He will also likely need CTA or MRA H&N when patient stable to undergo; attn to R carotid as source of embolism. - rEEG Mon - Plan for wake-up exam when patient has been successfully weaned from sedation (ideally 24 off sedation prior to exam)  Will f/u tomorrow.  Bing Neighbors, MD [facility] [phone]  If 7pm- 7am, please page neurology on call as listed in AMION.

## 2023-04-10 NOTE — Progress Notes (Addendum)
Overnight patient developed increased work of breathing, was tachycardic to upper 140's, hypertensive in the 190's systolic, tachypneic in the 40's and desaturated to mid 60's requiring bagging via BVM. Vent changes made and sedation adjusted as below. STAT Chest xray and ABG obtained 1 hour post changes as below:  Vent Mode: PRVC FiO2 (%):  [30 %-40 %] 50 % Set Rate:  [24 bmp-26 bmp] 22 bmp Vt Set:  [450 mL-500 mL] 500 mL PEEP:  [5 cmH20-8 cmH20] 5 cmH20  ABG    Component Value Date/Time   PHART 7.53 (H) 04/10/2023 0545   PCO2ART 36 04/10/2023 0545   PO2ART 74 (L) 04/10/2023 0545   HCO3 30.1 (H) 04/10/2023 0545   TCO2 33 (H) 04/05/2023 1459   ACIDBASEDEF 1.5 04/07/2023 0500   O2SAT 98.1 04/10/2023 0545   O2SAT 98.6 04/10/2023 0545   Chest Xray @ 04/10/2023: 0516 IMPRESSION: 1. Interval exchange of central lines. Otherwise unremarkable hardware. 2. Hazy and streaky density at the bases which may be atelectasis. No pneumothorax. 3. Extensive artifact from support hardware.   Webb Silversmith, DNP, CCRN, FNP-C, AGACNP-BC Acute Care & Family Nurse Practitioner  [facility]  See Amion for personal pager PCCM on call pager [phone] until 7 am

## 2023-04-10 NOTE — Progress Notes (Signed)
PHARMACY CONSULT NOTE - FOLLOW UP  Pharmacy Consult for Electrolyte Monitoring and Replacement   Recent Labs: Potassium (mmol/L)  Date Value  04/10/2023 3.1 (L)   Magnesium (mg/dL)  Date Value  81/19/1478 2.0   Calcium (mg/dL)  Date Value  29/56/2130 8.2 (L)   Albumin (g/dL)  Date Value  86/57/8469 2.4 (L)  04/01/2021 4.2   Phosphorus (mg/dL)  Date Value  62/95/2841 2.9   Sodium (mmol/L)  Date Value  04/10/2023 146 (H)  10/05/2022 141   Assessment: DC is a 78 yo male who presented to [facility] ED post cardiac arrest. Their downtime was 2 hours. Pharmacy has been consult to manage this patient's electrolytes while in the ICU.   Fluids: Norepi + propofol + vaso Nutrition: Vital high protein 38mL/hr + free water 30 mL Q4H Pertinent Medications:   Goal of Therapy:  WNL  Plan:  K+ = 3.1, give Kcl 40 mEq per tube q4h x 2 doses F/u BMP, Mg, and Phos with AM labs.   Barrie Folk, PharmD 04/10/2023 3:40 PM

## 2023-04-10 NOTE — Progress Notes (Signed)
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  He presented to [facility] ER via EMS from [facility] following a witnessed cardiac arrest.  Bystander CPR initiated and EMS arrived on the scene within 30 seconds cardiac rhythm asystole.  ROSC initially achieved, however pt in and out of PEA/asystole.    Pt recently evaluated in the ER on 02/4 with urinary urgency and frequency that started 5 days prior to that presentation.  Diagnosed with a UTI and prescribed Bactrim DS twice daily for 10 days.  However, symptoms persisted prompting [facility] visit today.  ED Course  Upon arrival to the ER pt on NRB with pulse.  It was reported during chest compressions the pt began to fight and pulling at medical devices.  While in the the ER waiting room the pt cardiac arrested again requiring ACLS protocol and mechanical intubation.  Pt with varied cardiac rhythm during cardiac arrest PEA/asystole/complete heart block.  He required transcutaneously pacing.  EKG showed apparent sinus rhythm with complete heart block and ventricular escape rhythm with LBBB morphology.  Total downtime estimated 2hrs from out of hospital cardiac arrest and inpatient cardiac arrest.  Pt required epinephrine gtt due to bradycardia and hypotension.  Pt taken emergently to the cardiac cath lab for temporary transvenous pacemaker placement, emergent cardiac catheterization, and swan-ganz catheter placement.   Cardiac cath revealed severe native CAD with patent LIMA-LAD and SVG-OM. SVG-RCA is occluded, but did not appear  acute and unlikely the inciting factor for today's cardiac arrest.  Per cardiology if pt makes a meaningful recovery he may require revascularization of the RCA.  Pt admitted to the ICU post cardiac cath PCCM consulted to asume care.    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  Worsening AKI and mild hyperkalemia, given shifting measures.  Low threshold for Nephrology consultation. 02/13: Weaning down vasopressors, Vasopressin off, Levo @ 7, Epi @ 6.  On minimal vent support.  Renal function and hyperkalemia slowly improving.  Advanced Heart Failure diuresing with 40 mg IV Lasix x1 dose for CVP of 15.  Perform WUA. 02/14: Weaned off vasopressors  On minimal vent support, failed WUA due to        severe hypertension and increased WOB.  On WUA only opening eyes, not        following commands.  MRI Brain revealed 2 small acute infarctions affecting the        cortical brain in theright frontal region.Theone Murdoch, temporary pacer and right        femoral venous sheath removed.  AKI slowly improving.  Heparin gtt discontinued  given low suspicion for PE.  To have PICC placed per Heart Failure team request. 02/15: Pt remains mechanically intubated.  All sedation off brainstem reflexes intact and opens eyes to voice.  Mentation precluding extubation.  Neurology consulted 02/16: Overnight pt developed worsening acute respiratory  failure with accessory muscle use, tachycardia, and hypoxia requiring bagging via BVM, therefore propofol gtt restarted.  Neurology recommending MRA Head/Neck and EEG once stable to transfer.  Performing WUA and possible SBT today   Interim History / Subjective:  As outlined above under Significant Hospital Events section  Micro Data:  2/11: COVID-19 PCR>> negative 2/11: MRSA PCR>> negative 2/11: Urine>>no growth 2/12: Blood cultures x2>>no growth to date  Antimicrobials:   Anti-infectives (From admission, onward)    Start     Dose/Rate Route Frequency Ordered Stop   04/06/23 1100  vancomycin (VANCOREADY) IVPB 2000 mg/400 mL        2,000 mg 200 mL/hr over 120 Minutes Intravenous  Once 04/06/23 1006 04/06/23 1253   04/06/23 1100  piperacillin-tazobactam (ZOSYN) IVPB 3.375 g        3.375 g 12.5 mL/hr over 240 Minutes Intravenous Every 8 hours 04/06/23 1006 04/11/23 1359   04/05/23 1830  cefTRIAXone (ROCEPHIN) 2 g in sodium chloride 0.9 % 100 mL IVPB  Status:  Discontinued        2 g 200 mL/hr over 30 Minutes Intravenous Every 24 hours 04/05/23 1736 04/06/23 0827      Objective   Blood pressure 120/64, pulse 97, temperature 98.8 F (37.1 C), temperature source Axillary, resp. rate (!) 25, height 6' (1.829 m), weight 104.7 kg, SpO2 97%. CVP:  [7 mmHg-28 mmHg] 12 mmHg  Vent Mode: PRVC FiO2 (%):  [30 %-50 %] 50 % Set Rate:  [22 bmp-26 bmp] 22 bmp Vt Set:  [450 mL-500 mL] 500 mL PEEP:  [5 cmH20-8 cmH20] 5 cmH20   Intake/Output Summary (Last 24 hours) at 04/10/2023 1041 Last data filed at 04/10/2023 1000 Gross per 24 hour  Intake 3010.83 ml  Output 2705 ml  Net 305.83 ml   Filed Weights   04/08/23 0320 04/09/23 0324 04/10/23 0400  Weight: 106.9 kg 104.2 kg 104.7 kg   Examination: General: Acute on chronically-ill appearing male, NAD mechanically intubated  HENT: Supple, mild JVD  Lungs: Faint rhonchi throughout, even, non labored, overbreathing the vent Cardiovascular: RRR,  no m/r/g, 2+ radial/1+ distal pulses, bilateral extremities cool to touch  Abdomen: +BS x4, obese, soft non distended  Extremities: Normal bulk and tone, no deformities Neuro: Sedated, opens eyes to voice, PERRL, +cough/gag reflexes GU: Indwelling foley catheter draining yellow urine   Resolved Hospital Problem list   Anion gap metabolic acidosis  Lactic acidosis   Assessment & Plan:  #Acute metabolic encephalopathy  #Mechanical intubation pain/discomfort  MR Brain 02/14: 2 small acute infarctions affecting the cortical brain in the right frontal region.  Mild chronic small-vessel ischemic change of the cerebral hemispheric white matter with a few old small cortical infarctions scattered about the right hemisphere suggesting chronic/recurrent micro embolic infarctions. Recommend carotid evaluation. 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 SVG-RCA is occluded, but did not appear acute and unlikely the inciting factor for today's cardiac arrest Echocardiogram 04/07/23:  LVEF 40-45%, mild LVH, Grade I DD, RV systolic function not well visualized, RV size normal - Continuous telemetry monitoring - Maintain map 65 or higher  - Lactic acid has normalized - Trend HS Troponin until peaked - Trend Coox panel - Diuresis as BP and renal function permits - Cardiology following, appreciate input: recommending if pt has a meaningful recovery may require revascularization of the RCA and if junctional rhythm persist start  amiodarone gtt - Heparin gtt discontinued 2/14 - Advanced heart failure team following along - Bidil started for bp control per cardiology for bp control  - Resumed clopidogrel 02/16 and continue atorvastatin  - Thyroid elevated at 18, but free T4 normal at 0.93  #Acute hypoxic hypercapnic respiratory failure  #Possible PE~low suspicion  #Aspiration pneumonia secondary to acute encephalopathy post cardiac arrest  #Mechanical intubation  - Full vent support, implement lung protective strategies - Plateau pressures less than 30 cm H20 - Wean FiO2 & PEEP as tolerated to maintain O2 sats >92% - Follow intermittent Chest X-ray & ABG as needed - Spontaneous Breathing Trials when respiratory parameters met and mental status permits - VAP bundle implemented  - Prn Bronchodilators - Unable to perform CTA PE Chest due to acute kidney injury consider lung VQ scan once stabilized to travel ~ Venous [place] of Bilateral LE's negative for DVT, Echo with no evidence of RV strain - Venous US RUE pending to r/o VTE   #Acute kidney injury secondary to ATN~improving  #Hypokalemia  - Trend BMP  - Strict I&O's  - Ensure adequate renal perfusion - Avoid nephrotoxic agents as able - Replace electrolytes as indicated ~ Pharmacy following for assistance with electrolyte replacement  #UTI  #Aspiration pneumonia  - Trend WBC and monitor fever curve - Trend PCT  - Follow cultures  - Continue abx as outlined above   #Transaminitis~improving   - Trend hepatic function panel  - Avoid hepatotoxic medications as able   #Hyperglycemia  - Hemoglobin A1c 02/11: 5.9 - CBG's q4hrs  - Very sensitive SSI  - Follow hyper/hypoglycemic protocol  - Target CBG readings 140 to 180   Best Practice (right click and "Reselect all SmartList Selections" daily)   Diet/type: tube feeds DVT prophylaxis SQ Heparin Pressure ulcer(s): N/A GI prophylaxis: H2B Lines: Portacath present on admission and Left Upper Extremity PICC  and still needed Foley:  Yes, and it is still needed Code Status:  full code Last date of multidisciplinary goals of care discussion [04/10/23]  2/16: Will update pts sisters when they arrive at bedside regarding pts condition and current plan of care.  Labs   CBC: Recent Labs  Lab 04/05/23 1743 04/06/23 0316 04/07/23 0401 04/08/23 0412 04/09/23 0413 04/10/23 0421  WBC 29.0* 34.5* 29.7* 24.0* 15.9* 9.6  NEUTROABS 25.2*  --   --   --   --   --   HGB 12.8* 13.0 10.2* 8.5* 8.4* 8.8*  HCT 37.3* 38.2* 30.8* 25.2* 24.5* 25.6*  MCV 98.4 97.7 99.0 99.2 102.1* 99.2  PLT 277 291 176 132* 128* 121*    Basic Metabolic Panel: Recent Labs  Lab 04/06/23 0316 04/06/23 0603 04/07/23 0401 04/07/23 1802 04/08/23 0412 04/08/23 1800 04/09/23 0413 04/09/23 1453 04/10/23 0418 04/10/23 0421  NA 140   < > 137 140 141 144 142  --  146*  --   K 7.1*   < > 5.3* 4.2 3.7 3.6 2.9* 3.6 2.6*  --   CL 105   < > 105 105 106 106 105  --  109  --   CO2 24   < > 23 26 26 26 29   --  26  --   GLUCOSE 162*   < > 168* 87 105* 97 107*  --  117*  --   BUN 47*   < > 50* 50* 50* 54* 57*  --  59*  --   CREATININE 2.78*   < > 2.30* 2.28* 2.17* 2.03* 2.04*  --  1.72*  --   CALCIUM 10.5*   < > 9.0 8.7* 8.4* 8.6* 8.3*  --  8.2*  --   MG 2.1  --  1.8  --  2.2  --  2.1  --   --  2.0  PHOS 4.0  --  4.4  --  3.5  --  4.0  --   --  2.9   < > = values in this interval not displayed.   GFR: Estimated Creatinine Clearance: 45.7 mL/min (A) (by C-G formula based on SCr of 1.72 mg/dL (H)). Recent Labs  Lab 04/05/23 1658 04/05/23 1743 04/06/23 0316 04/06/23 1042 04/06/23 1408 04/07/23 0401 04/07/23 0758 04/08/23 0412 04/09/23 0413 04/09/23 1453 04/10/23 0421  PROCALCITON 0.28  --  7.41  --   --   --   --   --   --  1.70  --   WBC  --    < > 34.5*  --   --  29.7*  --  24.0* 15.9*  --  9.6  LATICACIDVEN 2.9*   < > 3.0* 3.7* 2.8*  --  1.5  --   --   --   --    < > = values in this interval not displayed.    Liver  Function Tests: Recent Labs  Lab 04/05/23 1251 04/05/23 1703 04/07/23 0758 04/09/23 1453  AST 83* 273* 83* 37  ALT 134* 300* 146* 64*  ALKPHOS 99 124 80 73  BILITOT 0.8 0.9 0.7 0.9  PROT 6.8 5.9* 5.5* 5.3*  ALBUMIN 3.5 2.9* 2.6* 2.4*   No results for input(s): "LIPASE", "AMYLASE" in the last 168 hours. No results for input(s): "AMMONIA" in the last 168 hours.  ABG    Component Value Date/Time   PHART 7.49 (H) 04/10/2023 0800   PCO2ART 39 04/10/2023 0800   PO2ART 103 04/10/2023 0800   HCO3 29.7 (H) 04/10/2023 0800   TCO2 33 (H) 04/05/2023 1459   ACIDBASEDEF 1.5 04/07/2023 0500   O2SAT 100 04/10/2023 0800     Coagulation Profile: Recent Labs  Lab 04/05/23 1703  INR 1.4*    Cardiac Enzymes: No results for input(s): "CKTOTAL", "CKMB", "CKMBINDEX", "TROPONINI" in the last 168 hours.  HbA1C: Hgb A1c MFr Bld  Date/Time Value Ref Range Status  04/05/2023 07:38 PM 5.9 (H) 4.8 - 5.6 % Final    Comment:    (NOTE) Pre diabetes:          5.7%-6.4%  Diabetes:              >6.4%  Glycemic control for   <7.0% adults with diabetes   08/28/2017 02:27 AM 5.6 4.8 - 5.6 % Final    Comment:    (NOTE) Pre diabetes:          5.7%-6.4% Diabetes:              >  6.4% Glycemic control for   <7.0% adults with diabetes     CBG: Recent Labs  Lab 04/09/23 1642 04/09/23 1949 04/09/23 2354 04/10/23 0418 04/10/23 0742  GLUCAP 84 121* 81 81 101*    Review of Systems:   Unable to assess pt mechanically intubated   Past Medical History:  He,  has a past medical history of Alternating RBBB & LBBB, Ankle swelling (2024), Arthritis, Bladder tumor, CAD (coronary artery disease), Cancer (HCC), Cancer of base of tongue (HCC), Essential hypertension, 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   Surgical History:   Past Surgical History:   Procedure Laterality Date   CARDIAC CATHETERIZATION     CATARACT EXTRACTION W/PHACO Left 07/18/2018   Procedure: CATARACT EXTRACTION PHACO AND INTRAOCULAR LENS PLACEMENT (IOC)  LEFT;  Surgeon: Nevada Crane, MD;  Location: [facility];  Service: Ophthalmology;  Laterality: Left;   CATARACT EXTRACTION W/PHACO Right 08/14/2018   Procedure: CATARACT EXTRACTION PHACO AND INTRAOCULAR LENS PLACEMENT (IOC)  RIGHT;  Surgeon: Nevada Crane, MD;  Location: [facility];  Service: Ophthalmology;  Laterality: Right;   CORONARY ARTERY BYPASS GRAFT N/A 08/27/2017   Procedure: CORONARY ARTERY BYPASS GRAFTING (CABG) ON PUMP USING LEFT INTERNAL MAMMARY ARTERY AND LEFT GREATER SAPHENOUS VEIN VIA ENDOVEIN HARVEST;  Surgeon: Alleen Borne, MD;  Location: [facility] OR;  Service: Open Heart Surgery;  Laterality: N/A;   CORONARY/GRAFT ACUTE MI REVASCULARIZATION N/A 08/27/2017   Procedure: Coronary/Graft Acute MI Revascularization;  Surgeon: Iran Ouch, MD;  Location: [facility];  Service: Cardiovascular;  Laterality: N/A;   CYSTOSCOPY W/ RETROGRADES Bilateral 12/25/2018   Procedure: CYSTOSCOPY WITH RETROGRADE PYELOGRAM;  Surgeon: Vanna Scotland, MD;  Location: [facility] OR[facility];  Service: Urology;  Laterality: Bilateral;   FRACTURE SURGERY Right 1958   arm and left wrist compound fracture, no metal   HERNIA REPAIR Right    inguinial   IR IMAGING GUIDED PORT INSERTION  10/15/2022   LEFT HEART CATH AND CORONARY ANGIOGRAPHY N/A 08/27/2017   Procedure: LEFT HEART CATH AND CORONARY ANGIOGRAPHY;  Surgeon: Iran Ouch, MD;  Location: [facility];  Service: Cardiovascular;  Laterality: N/A;   RIGHT/LEFT HEART CATH AND CORONARY/GRAFT ANGIOGRAPHY N/A 04/05/2023   Procedure: RIGHT/LEFT HEART CATH AND CORONARY/GRAFT ANGIOGRAPHY;  Surgeon: Yvonne Kendall, MD;  Location: [facility];  Service: Cardiovascular;  Laterality: N/A;   TEMPORARY PACEMAKER N/A 04/05/2023   Procedure: TEMPORARY  PACEMAKER;  Surgeon: Yvonne Kendall, MD;  Location: [facility];  Service: Cardiovascular;  Laterality: N/A;   TRANSURETHRAL RESECTION OF BLADDER TUMOR WITH MITOMYCIN-C N/A 12/25/2018   Procedure: TRANSURETHRAL RESECTION OF BLADDER TUMOR WITH Gemcitabine;  Surgeon: Vanna Scotland, MD;  Location: [facility] OR[facility];  Service: Urology;  Laterality: N/A;     Social History:   reports that he quit smoking about 25 years ago. His smoking use included cigarettes. His smokeless tobacco use includes snuff. He reports that he does not currently use alcohol. He reports that he does not currently use drugs.   Family History:  His family history includes Cervical cancer in his maternal grandmother; Heart failure in his mother; Lung disease in his father; Stroke in his father.   Allergies No Known Allergies   Home Medications  Prior to Admission medications   Medication Sig Start Date End Date Taking? Authorizing Provider  acetaminophen (TYLENOL) 650 MG CR tablet Take 650 mg by mouth every  8 (eight) hours as needed for pain.    [provider]  aspirin EC 81 MG EC tablet Take 1 tablet (81 mg total) by mouth daily. 09/03/17   Gold, Glenice Laine, PA-C  atorvastatin (LIPITOR) 80 MG tablet Take 1 tablet by mouth once daily 02/08/23   Creig Hines, NP  carvedilol (COREG) 12.5 MG tablet TAKE 1 TABLET BY MOUTH TWICE DAILY WITH A MEAL 03/10/23   End, Cristal Deer, MD  lidocaine-prilocaine (EMLA) cream Apply on the port. 30 -45 min  prior to port access. 10/08/22   Earna Coder, MD  losartan (COZAAR) 100 MG tablet Take 1 tablet by mouth once daily 03/10/23   End, Cristal Deer, MD  magic mouthwash (multi-ingredient) oral suspension Swish and spit 5-10 mLs 4 (four) times daily as needed. 11/24/22   Earna Coder, MD  Multiple Vitamin (MULTIVITAMIN WITH MINERALS) TABS tablet Take 1 tablet by mouth daily. One a Day over 50/ lunch    [provider]  nitroGLYCERIN (NITROSTAT) 0.4  MG SL tablet DISSOLVE ONE TABLET UNDER THE TONGUE EVERY 5 MINUTES AS NEEDED FOR CHEST PAIN.  DO NOT EXCEED A TOTAL OF 3 DOSES IN 15 MINUTES 03/24/23   Creig Hines, NP  OLANZapine (ZYPREXA) 5 MG tablet Take one pill at night. 11/03/22   Earna Coder, MD  ondansetron (ZOFRAN) 8 MG tablet One pill every 8 hours as needed for nausea/vomitting. 10/08/22   Earna Coder, MD  polyethylene glycol (MIRALAX) 17 g packet Take 17 g by mouth daily. 11/07/22   Romeo Apple, Myah A, PA-C  prochlorperazine (COMPAZINE) 10 MG tablet Take 1 tablet (10 mg total) by mouth every 6 (six) hours as needed for nausea or vomiting. 10/08/22   Earna Coder, MD  senna-docusate (SENOKOT-S) 8.6-50 MG tablet Take 1 tablet by mouth daily. 11/07/22   Romeo Apple, Myah A, PA-C  sucralfate (CARAFATE) 1 g tablet Take 1 tablet (1 g total) by mouth 4 (four) times daily -  with meals and at bedtime. Dissolve in 4 tablespoons warm water 30 minutes before meals. 10/26/22   Carmina Miller, MD  sulfamethoxazole-trimethoprim (BACTRIM DS) 800-160 MG tablet Take 1 tablet by mouth 2 (two) times daily for 10 days. 03/29/23 04/08/23  Becky Augusta, NP  torsemide (DEMADEX) 20 MG tablet Take 1 tablet (20 mg total) by mouth daily as needed (swelling and weight gain). 11/25/22 02/23/23  Creig Hines, NP  traMADol HCl 100 MG TABS Take 100 mg by mouth every 8 (eight) hours as needed. 10/26/22   Earna Coder, MD     Critical care time: 40 minutes        Zada Girt, AGNP  Pulmonary/Critical Care Pager [phone] (please enter 7 digits) PCCM Consult Pager [phone] (please enter 7 digits)

## 2023-04-10 NOTE — Progress Notes (Signed)
Rounding Note    Patient Name: Cory Hess Date of Encounter: 04/10/2023  [facility] Cardiologist: Yvonne Kendall, MD   Subjective   Remains intubated, sedated Failed SBT FiO2 40%, back on full support Attempted wean yesterday with hypertension, tachycardia, failed SBT Chest x-ray this morning hazy and streaky densities at the bases Normal sinus rhythm on telemetry  Inpatient Medications    Scheduled Meds:  acetaminophen  650 mg Per Tube Q6H   aspirin  81 mg Per Tube Daily   atorvastatin  80 mg Per Tube Daily   Chlorhexidine Gluconate Cloth  6 each Topical Daily   clopidogrel  75 mg Per Tube Daily   docusate  100 mg Per Tube BID   feeding supplement (PROSource TF20)  60 mL Per Tube BID   free water  30 mL Per Tube Q4H   heparin injection (subcutaneous)  5,000 Units Subcutaneous Q8H   insulin aspart  0-6 Units Subcutaneous Q4H   isosorbide-hydrALAZINE  0.5 tablet Per Tube Q6H   lidocaine  1 patch Transdermal Q24H   mouth rinse  15 mL Mouth Rinse Q2H   oxyCODONE  10 mg Per Tube Q6H   polyethylene glycol  17 g Per Tube Daily   sodium chloride flush  10-40 mL Intracatheter Q12H   sodium chloride flush  10-40 mL Intracatheter Q12H   Continuous Infusions:  sodium chloride 10 mL/hr at 04/10/23 1000   famotidine (PEPCID) IV Stopped (04/09/23 1905)   feeding supplement (VITAL HIGH PROTEIN) 60 mL/hr at 04/10/23 1147   piperacillin-tazobactam (ZOSYN)  IV 12.5 mL/hr at 04/10/23 0838   potassium chloride 10 mEq (04/10/23 1120)   propofol (DIPRIVAN) infusion 25 mcg/kg/min (04/10/23 0906)   PRN Meds: sodium chloride, acetaminophen, hydrALAZINE, ipratropium-albuterol, ondansetron (ZOFRAN) IV, mouth rinse, sodium chloride flush, sodium chloride flush   Vital Signs    Vitals:   04/10/23 0800 04/10/23 0801 04/10/23 0802 04/10/23 1126  BP:      Pulse: 97 97    Resp: (!) 27 (!) 25    Temp:    99 F (37.2 C)  TempSrc:    Axillary  SpO2: 96% 97% 97%   Weight:       Height:        Intake/Output Summary (Last 24 hours) at 04/10/2023 1210 Last data filed at 04/10/2023 1121 Gross per 24 hour  Intake 2929.91 ml  Output 2730 ml  Net 199.91 ml      04/10/2023    4:00 AM 04/09/2023    3:24 AM 04/08/2023    3:20 AM  Last 3 Weights  Weight (lbs) 230 lb 13.2 oz 229 lb 11.5 oz 235 lb 10.8 oz  Weight (kg) 104.7 kg 104.2 kg 106.9 kg      Telemetry     - Personally Reviewed  ECG     - Personally Reviewed  Physical Exam   GEN: Intubated, sedated Neck: No JVD Cardiac: RRR, no murmurs, rubs, or gallops.  Respiratory: Coarse breath sounds bilaterally GI: Soft,  non-distended  MS: No edema; No deformity. Neuro: Unable to fully test Psych: Sleeping  Labs    High Sensitivity Troponin:   Recent Labs  Lab 04/05/23 1251 04/05/23 1658 04/09/23 1453  TROPONINIHS 21* 898* 155*     Chemistry Recent Labs  Lab 04/05/23 1703 04/05/23 2226 04/07/23 0758 04/07/23 1802 04/08/23 0412 04/08/23 1800 04/09/23 0413 04/09/23 1453 04/10/23 0418 04/10/23 0421  NA 142   < >  --    < > 141 144  142  --  146*  --   K 5.7*   < >  --    < > 3.7 3.6 2.9* 3.6 2.6*  --   CL 106   < >  --    < > 106 106 105  --  109  --   CO2 27   < >  --    < > 26 26 29   --  26  --   GLUCOSE 155*   < >  --    < > 105* 97 107*  --  117*  --   BUN 37*   < >  --    < > 50* 54* 57*  --  59*  --   CREATININE 2.33*   < >  --    < > 2.17* 2.03* 2.04*  --  1.72*  --   CALCIUM 11.2*   < >  --    < > 8.4* 8.6* 8.3*  --  8.2*  --   MG  --    < >  --   --  2.2  --  2.1  --   --  2.0  PROT 5.9*  --  5.5*  --   --   --   --  5.3*  --   --   ALBUMIN 2.9*  --  2.6*  --   --   --   --  2.4*  --   --   AST 273*  --  83*  --   --   --   --  37  --   --   ALT 300*  --  146*  --   --   --   --  64*  --   --   ALKPHOS 124  --  80  --   --   --   --  73  --   --   BILITOT 0.9  --  0.7  --   --   --   --  0.9  --   --   GFRNONAA 28*   < >  --    < > 31* 33* 33*  --  41*  --   ANIONGAP 9   < >   --    < > 9 12 8   --  11  --    < > = values in this interval not displayed.    Lipids  Recent Labs  Lab 04/07/23 0758 04/09/23 0413 04/09/23 0711  CHOL 69  --   --   TRIG 114   < > 118  HDL 21*  --   --   LDLCALC 25  --   --   CHOLHDL 3.3  --   --    < > = values in this interval not displayed.    Hematology Recent Labs  Lab 04/08/23 0412 04/09/23 0413 04/10/23 0421  WBC 24.0* 15.9* 9.6  RBC 2.54* 2.40* 2.58*  HGB 8.5* 8.4* 8.8*  HCT 25.2* 24.5* 25.6*  MCV 99.2 102.1* 99.2  MCH 33.5 35.0* 34.1*  MCHC 33.7 34.3 34.4  RDW 11.9 12.2 12.2  PLT 132* 128* 121*   Thyroid  Recent Labs  Lab 04/05/23 1658  TSH 18.053*  FREET4 0.93    BNP Recent Labs  Lab 04/05/23 1743 04/10/23 0418  BNP 169.6* 437.6*    DDimer  Recent Labs  Lab 04/05/23 1658  DDIMER 6.28*     Radiology  DG Chest Port 1 View Result Date: 04/10/2023 CLINICAL DATA:  Respiratory distress EXAM: PORTABLE CHEST 1 VIEW COMPARISON:  Three days ago FINDINGS: Endotracheal tube with tip just below the clavicular heads. An enteric tube tip is at the stomach. Right port with tip at the SVC. Left PICC with tip obscured at the lower SVC. Extensive artifact from EKG leads. There is hazy and bandlike opacity in the lower lungs. Normal heart size and stable mediastinal contours. No pneumothorax or pleural fluid. IMPRESSION: 1. Interval exchange of central lines. Otherwise unremarkable hardware. 2. Hazy and streaky density at the bases which may be atelectasis. No pneumothorax. 3. Extensive artifact from support hardware. Electronically Signed   By: Tiburcio Pea M.D.   On: 04/10/2023 05:16   MR BRAIN WO CONTRAST Result Date: 04/08/2023 CLINICAL DATA:  Mental status change of unknown cause. 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 EXAM: MRI HEAD WITHOUT CONTRAST TECHNIQUE: Multiplanar, multiecho pulse sequences of the brain and surrounding structures were obtained without intravenous contrast.  COMPARISON:  None Available. FINDINGS: Brain: Diffusion imaging shows 2 small acute infarctions affecting the cortical brain in the right frontal region. Otherwise, the brainstem and cerebellum are normal. Cerebral hemispheres show mild chronic small-vessel ischemic change of the white matter with a few old small cortical infarctions scattered about the right hemisphere suggesting chronic/recurrent micro embolic infarctions. Recommend carotid evaluation. No evidence of mass, hemorrhage, hydrocephalus or extra-axial collection. Vascular: Major vessels at the base of the brain show flow. Skull and upper cervical spine: Negative Sinuses/Orbits: Mild seasonal mucosal inflammatory changes of the sinuses. Orbits negative. Other: Tiny mastoid effusions. IMPRESSION: 1. 2 small acute infarctions affecting the cortical brain in the right frontal region. 2. Mild chronic small-vessel ischemic change of the cerebral hemispheric white matter with a few old small cortical infarctions scattered about the right hemisphere suggesting chronic/recurrent micro embolic infarctions. Recommend carotid evaluation. Electronically Signed   By: Paulina Fusi M.D.   On: 04/08/2023 18:30    Cardiac Studies  Echo 1. Very challenging images, definity not used   2. Left ventricular ejection fraction, by estimation, is 40 to 45 %. The  left ventricle has mildly decreased function. The left ventricle  demonstrates regional wall motion abnormalities (images suggest  anteroseptal, inferior/posterior and apical  hypokinesis, possibly anterior wall hypokinesis). There is mild left  ventricular hypertrophy. Left ventricular diastolic parameters are  consistent with Grade I diastolic dysfunction (impaired relaxation).   3. Right ventricular systolic function was not well visualized. The right  ventricular size is normal.   4. The mitral valve is normal in structure. No evidence of mitral valve  regurgitation. No evidence of mitral stenosis.    5. The aortic valve is normal in structure. There is mild calcification  of the aortic valve. Aortic valve regurgitation is not visualized. Aortic  valve sclerosis/calcification is present, without any evidence of aortic  stenosis. Aortic valve mean  gradient measures 2.0 mmHg.   Patient Profile     78 y.o. 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   Assessment & Plan    1. Cardiac arrest / CHB -Cardiac arrest February 11, while at urgent care  CPR started immediately by bystander ROSC numerous times, PEA, asystole, complete heart block,  transvenous pacer placed -Left heart catheterization February 11 with severe native CAD with patent LIMA-LAD and SVG-OM. SVG-RCA is occluded. Does not appear acute -Required pressors for blood pressure support, this has been weaned off -Agitation with weaning off propofol, remains intubated, sedated, full support -Brain MRI detailing 2 small acute infarctions right frontal region -Will need carotid ultrasound   Shock -Lactic acid greater than 9 on admission, hyperkalemia -Concern for cardiogenic/vasoplegic/septic Blood cultures negative, off pressors -Broad-spectrum antibiotics  -Coox improved and stable, 70s up to 90s -Blood pressure elevated off sedation, tolerating half dose BiDil every 6 hours -Hydralazine 10 mg IV as needed for spikes in pressure   HFimpEF, iCM - 09/2022 Echo: EF 55-60%, mod LVH, GrI DD, nl RV fxn, mildly dil RA, mild MR. Echo this admission with EF 40-45%  - CVP remains 9-10 -Tolerating half dose BiDil every 6 hours, titration of GDMT limited by hypotension while on sedation -Received 2 doses of IV Lasix February 14th, 1 dose Lasix  IV February 15 -Renal function improving, repeating potassium before any additional Lasix   CAD -Known coronary artery disease, non-STEMI 2019, CABG times 04/2017 - LHC 2/11: severe native CAD with patent LIMA-LAD and SVG-OM. SVG-RCA is occluded. Does not appear acute -Continue aspirin, statin   Alternating BBB - hx of IVCD.  - RBBB on EKG on admission --Transvenous pacer removed 2 days ago Avoid beta-blockers   6. Acute respiratory failure - hypoxic on admission in the setting of PEA cardiac arrest -Off heparin infusion Remains intubated, sedated, failing SBT   7. AKI - SCr 2.32 on admission - Baseline ~1 Stable remains 2.0 with slight improvement over the past week Suspect ATN   8. Hypokalemia - Up to 7.1 post cath 2/11 - K down to 2.6 today after Lasix  Will replete with additional 40 powder by NG tube    For questions or updates, please contact [facility] Please consult [email or web address] for contact info under        Signed, Julien Nordmann, MD  04/10/2023, 12:10 PM

## 2023-04-11 ENCOUNTER — Ambulatory Visit: Payer: Medicare Other

## 2023-04-11 ENCOUNTER — Inpatient Hospital Stay: Payer: Medicare Other

## 2023-04-11 DIAGNOSIS — J9602 Acute respiratory failure with hypercapnia: Secondary | ICD-10-CM | POA: Diagnosis not present

## 2023-04-11 DIAGNOSIS — I639 Cerebral infarction, unspecified: Secondary | ICD-10-CM | POA: Diagnosis not present

## 2023-04-11 DIAGNOSIS — R569 Unspecified convulsions: Secondary | ICD-10-CM

## 2023-04-11 DIAGNOSIS — J690 Pneumonitis due to inhalation of food and vomit: Secondary | ICD-10-CM

## 2023-04-11 DIAGNOSIS — Z7189 Other specified counseling: Secondary | ICD-10-CM | POA: Diagnosis not present

## 2023-04-11 DIAGNOSIS — G9341 Metabolic encephalopathy: Secondary | ICD-10-CM | POA: Diagnosis not present

## 2023-04-11 DIAGNOSIS — J9601 Acute respiratory failure with hypoxia: Secondary | ICD-10-CM | POA: Diagnosis not present

## 2023-04-11 DIAGNOSIS — I469 Cardiac arrest, cause unspecified: Secondary | ICD-10-CM | POA: Diagnosis not present

## 2023-04-11 DIAGNOSIS — I442 Atrioventricular block, complete: Secondary | ICD-10-CM | POA: Diagnosis not present

## 2023-04-11 LAB — CBC
HCT: 27 % — ABNORMAL LOW (ref 39.0–52.0)
Hemoglobin: 8.8 g/dL — ABNORMAL LOW (ref 13.0–17.0)
MCH: 33.7 pg (ref 26.0–34.0)
MCHC: 32.6 g/dL (ref 30.0–36.0)
MCV: 103.4 fL — ABNORMAL HIGH (ref 80.0–100.0)
Platelets: 127 10*3/uL — ABNORMAL LOW (ref 150–400)
RBC: 2.61 MIL/uL — ABNORMAL LOW (ref 4.22–5.81)
RDW: 12.9 % (ref 11.5–15.5)
WBC: 10.6 10*3/uL — ABNORMAL HIGH (ref 4.0–10.5)
nRBC: 0 % (ref 0.0–0.2)

## 2023-04-11 LAB — MAGNESIUM: Magnesium: 2.2 mg/dL (ref 1.7–2.4)

## 2023-04-11 LAB — CULTURE, BLOOD (ROUTINE X 2)
Culture: NO GROWTH
Culture: NO GROWTH
Special Requests: ADEQUATE

## 2023-04-11 LAB — GLUCOSE, CAPILLARY
Glucose-Capillary: 115 mg/dL — ABNORMAL HIGH (ref 70–99)
Glucose-Capillary: 118 mg/dL — ABNORMAL HIGH (ref 70–99)
Glucose-Capillary: 80 mg/dL (ref 70–99)
Glucose-Capillary: 86 mg/dL (ref 70–99)
Glucose-Capillary: 95 mg/dL (ref 70–99)
Glucose-Capillary: 96 mg/dL (ref 70–99)

## 2023-04-11 LAB — BASIC METABOLIC PANEL
Anion gap: 10 (ref 5–15)
BUN: 51 mg/dL — ABNORMAL HIGH (ref 8–23)
CO2: 24 mmol/L (ref 22–32)
Calcium: 8.4 mg/dL — ABNORMAL LOW (ref 8.9–10.3)
Chloride: 114 mmol/L — ABNORMAL HIGH (ref 98–111)
Creatinine, Ser: 1.5 mg/dL — ABNORMAL HIGH (ref 0.61–1.24)
GFR, Estimated: 48 mL/min — ABNORMAL LOW (ref >60)
Glucose, Bld: 132 mg/dL — ABNORMAL HIGH (ref 70–99)
Potassium: 3.2 mmol/L — ABNORMAL LOW (ref 3.5–5.1)
Sodium: 148 mmol/L — ABNORMAL HIGH (ref 135–145)

## 2023-04-11 LAB — COOXEMETRY PANEL
Carboxyhemoglobin: 2.1 % — ABNORMAL HIGH (ref 0.5–1.5)
Carboxyhemoglobin: 2 % — ABNORMAL HIGH (ref 0.5–1.5)
Methemoglobin: 1.5 % (ref 0.0–1.5)
Methemoglobin: 1.2 % (ref 0.0–1.5)
O2 Saturation: 100 %
O2 Saturation: 73.9 %
Total hemoglobin: 9.2 g/dL — ABNORMAL LOW (ref 12.0–16.0)
Total hemoglobin: 11.9 g/dL — ABNORMAL LOW (ref 12.0–16.0)
Total oxygen content: 96.4 %
Total oxygen content: 71.5 %

## 2023-04-11 LAB — PHOSPHORUS: Phosphorus: 2.8 mg/dL (ref 2.5–4.6)

## 2023-04-11 LAB — BLOOD GAS, ARTERIAL
Acid-Base Excess: 2.8 mmol/L — ABNORMAL HIGH (ref 0.0–2.0)
Bicarbonate: 26.1 mmol/L (ref 20.0–28.0)
FIO2: 50 %
MECHVT: 500 mL
O2 Saturation: 99.9 %
PEEP: 5 cmH2O
Patient temperature: 37
RATE: 22 {breaths}/min
pCO2 arterial: 35 mm[Hg] (ref 32–48)
pH, Arterial: 7.48 — ABNORMAL HIGH (ref 7.35–7.45)
pO2, Arterial: 112 mm[Hg] — ABNORMAL HIGH (ref 83–108)

## 2023-04-11 MED ORDER — POTASSIUM CHLORIDE 20 MEQ PO PACK
40.0000 meq | PACK | Freq: Once | ORAL | Status: AC
  Administered 2023-04-11: 40 meq
  Filled 2023-04-11: qty 2

## 2023-04-11 MED ORDER — PIPERACILLIN-TAZOBACTAM 3.375 G IVPB
3.3750 g | Freq: Once | INTRAVENOUS | Status: AC
  Administered 2023-04-11: 3.375 g via INTRAVENOUS
  Filled 2023-04-11: qty 50

## 2023-04-11 MED ORDER — ENOXAPARIN SODIUM 60 MG/0.6ML IJ SOSY
50.0000 mg | PREFILLED_SYRINGE | INTRAMUSCULAR | Status: DC
  Administered 2023-04-11 – 2023-04-14 (×4): 50 mg via SUBCUTANEOUS
  Filled 2023-04-11 (×5): qty 0.6

## 2023-04-11 MED ORDER — STROKE: EARLY STAGES OF RECOVERY BOOK: Freq: Once | Status: AC

## 2023-04-11 MED ORDER — FREE WATER
100.0000 mL | Status: DC
  Administered 2023-04-11 – 2023-04-13 (×12): 100 mL

## 2023-04-11 NOTE — Progress Notes (Signed)
Advanced Heart Failure Rounding Note  Cardiologist: Yvonne Kendall, MD   Chief Complaint: Cardiac arrest  Subjective:    Started low dose NE overnight d/t sedation.   CO-OX 74%.   CVP 5. Not currently on diuretics.  Remains intubated and sedated. Failed SBT yesterday. Gets agitated with lightening sedation.   Objective:   Weight Range: 106.9 kg Body mass index is 31.96 kg/m.   Vital Signs:   Temp:  [98.6 F (37 C)-100.2 F (37.9 C)] 98.6 F (37 C) (02/17 0700) Pulse Rate:  [86-131] 88 (02/17 0730) Resp:  [12-42] 15 (02/17 0730) BP: (94-145)/(52-89) 118/59 (02/17 0700) SpO2:  [91 %-97 %] 94 % (02/17 0730) Arterial Line BP: (112-199)/(43-69) 164/51 (02/17 0730) FiO2 (%):  [50 %] 50 % (02/17 0724) Weight:  [106.9 kg] 106.9 kg (02/17 0500) Last BM Date : 04/10/23  Weight change: Filed Weights   04/09/23 0324 04/10/23 0400 04/11/23 0500  Weight: 104.2 kg 104.7 kg 106.9 kg    Intake/Output:   Intake/Output Summary (Last 24 hours) at 04/11/2023 0741 Last data filed at 04/11/2023 0700 Gross per 24 hour  Intake 3375.55 ml  Output 2080 ml  Net 1295.55 ml    Physical Exam   General:  Ill appearing elderly male HEENT: + ETT/OG Neck: no JVD Cor: Regular rate & rhythm. No rubs, gallops or murmurs. Lungs: clear anteriorly Abdomen: soft, nondistended.  Extremities: 1-2+ pretibial edema, + TED hose Neuro: Sedated on vent. Unresponsive.    Telemetry   SR 80s-90s (personally reviewed)   Labs    CBC Recent Labs    04/10/23 0421 04/11/23 0406  WBC 9.6 10.6*  HGB 8.8* 8.8*  HCT 25.6* 27.0*  MCV 99.2 103.4*  PLT 121* 127*   Basic Metabolic Panel Recent Labs    16/10/96 0418 04/10/23 0421 04/10/23 1502 04/11/23 0406  NA 146*  --   --  148*  K 2.6*  --  3.1* 3.2*  CL 109  --   --  114*  CO2 26  --   --  24  GLUCOSE 117*  --   --  132*  BUN 59*  --   --  51*  CREATININE 1.72*  --   --  1.50*  CALCIUM 8.2*  --   --  8.4*  MG  --  2.0  --   2.2  PHOS  --  2.9  --  2.8   Liver Function Tests Recent Labs    04/09/23 1453  AST 37  ALT 64*  ALKPHOS 73  BILITOT 0.9  PROT 5.3*  ALBUMIN 2.4*   No results for input(s): "LIPASE", "AMYLASE" in the last 72 hours. Cardiac Enzymes No results for input(s): "CKTOTAL", "CKMB", "CKMBINDEX", "TROPONINI" in the last 72 hours.  BNP: BNP (last 3 results) Recent Labs    04/05/23 1743 04/10/23 0418  BNP 169.6* 437.6*    ProBNP (last 3 results) No results for input(s): "PROBNP" in the last 8760 hours.   D-Dimer No results for input(s): "DDIMER" in the last 72 hours.  Hemoglobin A1C No results for input(s): "HGBA1C" in the last 72 hours.  Fasting Lipid Panel Recent Labs    04/09/23 0711  TRIG 118   Thyroid Function Tests No results for input(s): "TSH", "T4TOTAL", "T3FREE", "THYROIDAB" in the last 72 hours.  Invalid input(s): "FREET3"   Other results:   Imaging    US Venous Img Upper Uni Right(DVT) Result Date: 04/10/2023 CLINICAL DATA:  Right upper extremity edema. 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 EXAM: RIGHT UPPER EXTREMITY VENOUS DOPPLER ULTRASOUND TECHNIQUE: Gray-scale sonography with graded compression, as well as color Doppler and duplex ultrasound were performed to evaluate the upper extremity deep venous system from the level of the subclavian vein and including the jugular, axillary, basilic, radial, ulnar and upper cephalic vein. Spectral Doppler was utilized to evaluate flow at rest and with distal augmentation maneuvers. COMPARISON:  None Available. FINDINGS: Contralateral Subclavian Vein: Respiratory phasicity is normal and symmetric with the symptomatic side. No evidence of thrombus. Normal compressibility. Internal Jugular Vein: Nonocclusive thrombus in the visualized inferior aspect of the right internal jugular vein. There is some flow around thrombus. Subclavian Vein: No  evidence of thrombus. Normal compressibility, respiratory phasicity and response to augmentation. Axillary Vein: No evidence of thrombus. Normal compressibility, respiratory phasicity and response to augmentation. Cephalic Vein: Thrombophlebitis of the right cephalic vein in the forearm. Basilic Vein: Thrombophlebitis of the right basilic vein in the mid to distal arm. Brachial Veins: No evidence of thrombus. Normal compressibility, respiratory phasicity and response to augmentation. Radial Veins: No evidence of thrombus. Normal compressibility, respiratory phasicity and response to augmentation. Ulnar Veins: No evidence of thrombus. Normal compressibility, respiratory phasicity and response to augmentation. Venous Reflux:  None visualized. Other Findings:  No abnormal fluid collections. IMPRESSION: 1. Nonocclusive thrombus in the visualized inferior aspect of the right internal jugular vein. 2. Superficial thrombophlebitis of the right cephalic vein in the forearm and right basilic vein in the mid to distal arm. Electronically Signed   By: Irish Lack M.D.   On: 04/10/2023 12:22     Medications:     Scheduled Medications:  acetaminophen  650 mg Per Tube Q6H   aspirin  81 mg Per Tube Daily   atorvastatin  80 mg Per Tube Daily   Chlorhexidine Gluconate Cloth  6 each Topical QHS   clopidogrel  75 mg Per Tube Daily   docusate  100 mg Per Tube BID   feeding supplement (PROSource TF20)  60 mL Per Tube BID   free water  30 mL Per Tube Q4H   heparin injection (subcutaneous)  5,000 Units Subcutaneous Q8H   insulin aspart  0-6 Units Subcutaneous Q4H   isosorbide-hydrALAZINE  0.5 tablet Per Tube Q6H   lidocaine  1 patch Transdermal Q24H   mouth rinse  15 mL Mouth Rinse Q2H   oxyCODONE  10 mg Per Tube Q6H   polyethylene glycol  17 g Per Tube Daily   potassium chloride  40 mEq Per Tube Once   sodium chloride flush  10-40 mL Intracatheter Q12H   sodium chloride flush  10-40 mL Intracatheter Q12H     Infusions:  sodium chloride 10 mL/hr at 04/11/23 0630   famotidine (PEPCID) IV Stopped (04/10/23 1857)   feeding supplement (VITAL HIGH PROTEIN) 60 mL/hr at 04/11/23 0630   norepinephrine (LEVOPHED) Adult infusion 1 mcg/min (04/11/23 0630)   piperacillin-tazobactam (ZOSYN)  IV 12.5 mL/hr at 04/11/23 0630   propofol (DIPRIVAN) infusion 60 mcg/kg/min (04/11/23 0625)    PRN Medications: sodium chloride, acetaminophen, hydrALAZINE, ipratropium-albuterol, ondansetron (ZOFRAN) IV, mouth rinse, sodium chloride flush, sodium chloride flush    Patient Profile  Cory Hess is a 78 y.o. 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 AHF team to see post cardiac arrest.  Assessment/Plan   1. Cardiac arrest / CHB - witnessed cardiac arrest 2/11 while in [place] urgent care waiting room>transferred to [facility] - CPR started immediately by bystander with EMD arrival within 30 seconds - ROSC achieved numerous times but he would keep losing his pulse. Pulse checks showed PEA, asystole and CHB.  - ROSC achieved -> TVP - LHC 2/11: severe native CAD with patent LIMA-LAD and SVG -OM. SVG-RCA occluded (not acute). - TVP removed 02/14. No recurrent heart block on telemetry. - Etiology felt to be PEA/hyperkalemia/brady - Now off pressors    Shock - Mixed picture of cardiogenic/vasoplegic/septic - Lactic acid >9 on admission, cleared - Blood cultures negative - Procal 7.4>1.7 - Remains on Zosyn per CCM   HFimpEF, iCM - 09/2022 Echo: EF 55-60%, mod LVH, GrI DD, nl RV fxn, mildly dil RA, mild MR. - see full echo history above in HPI. (EF 35% pre CABG) - ECHO EF 40-45% this admit - NE added back overnight in setting of sedation. Wean off this am. Target SBP > 110 rather than MAP as he has low diastolic pressure - Stop bidil for now.   CAD - Hx nSTEMI 78'  - Now s/p  CABG x3 74' - LHC 2/11: severe native CAD with patent LIMA-LAD and SVG-OM. SVG-RCA is occluded. Does not appear acute - Continue ASA/ statin - No s/s of ongoing angina   Alternating BBB - hx of IVCD.  - RBBB on EKG on admission - TVP pulled on 02/14. No recurrent heart block on teel - if recovers will need to watch closely on tele for nee for possible PPM but unlikely at this point.   6. Acute respiratory failure - hypoxic on admission - Initial concern for PE, CTA chest not performed initially d/t AKI - Failed SBT yesterday. CCM considering trach/PEG pending further GOC discussions with family  7. 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 Also had remote infarcts in right hemisphere suggesting chronic/recurrent microembolic infarctions.  - Neuro on board - Further imaging w/ CTA or MRA to assess R carotid when stable enough - Not following commands today. Sedated on vent - ASA + plavix X 21 days per Neuro. On statin  8. AKI - SCr up to 2.32 on admission, likely ATN in setting of shock - Baseline ~1 - Now downtrending, 1.5 today  9. Hyperkalemia - Up to 7.1 post cath 2/11 - Now hypokalemic, K 3.2 today  10. R internal jugular thrombus - Noted on [place] 02/16 - Likely line related, central line has been removed - Consider anticoagulation X 3 months   Shock has resolved. Not requiring inotropes or pressors. Rhythm stable after removal of TVP. HF will sign off. Please call with any questions.   Length of Stay: 6  Ki Luckman N, PA-C  04/11/2023, 7:41 AM  Advanced Heart Failure Team Pager [phone] (M-F; 7a - 5p)  Please contact [facility] for night-coverage after hours (5p -7a ) and weekends on [email or web address]

## 2023-04-11 NOTE — Progress Notes (Signed)
Transported pt to and from CT via transport vent with no issues to report. Placed pt back on servo-I on noted settings. RN @ bedside. RRT will cont to monitor.

## 2023-04-11 NOTE — IPAL (Signed)
  Interdisciplinary Goals of Care Family Meeting   Date carried out: 04/11/2023  Location of the meeting: Conference room  Member's involved: Physician and Family Member or next of kin    GOALS OF CARE DISCUSSION  The Clinical status was relayed to family in detail- 3 sisters  Updated and notified of patients medical condition- Patient remains unresponsive and will not open eyes to command.   Patient with increased WOB and using accessory muscles to breathe Explained to family course of therapy and the modalities  Patient with Progressive multiorgan failure with a very high probablity of a very minimal chance of meaningful recovery despite all aggressive and optimal medical therapy.   Family understands the situation.  They have consented and agreed to DNR status Will proceed with [facility] AND PEG TUBE AND AGGRESSIVE MEDICAL MANAGEMENT I WILL ALSO FOLLOW UP WITH ONCOLOGY TO DETERMINE STAGING AND PLAN FOR CANCER  Family are satisfied with Plan of action and management. All questions answered  Additional CC time 45 mins   Kell Ferris Santiago Glad, M.D.  [facility]  Medical Director [facility] Medical Director [facility]

## 2023-04-11 NOTE — Procedures (Signed)
Patient Name: Emmert Roethler  MRN: [id number]  Epilepsy Attending: Charlsie Quest  Referring Physician/Provider: Jefferson Fuel, MD  Date: 04/11/2023 Duration: 28.27 mins  Patient history: 78yo M s/p cardiac arrest EEG to evaluate for seizure  Level of alertness: comatose  AEDs during EEG study: Propofol  Technical aspects: This EEG study was done with scalp electrodes positioned according to the 10-20 International system of electrode placement. Electrical activity was reviewed with band pass filter of 1-70Hz , sensitivity of 7 uV/mm, display speed of 44mm/sec with a 60Hz  notched filter applied as appropriate. EEG data were recorded continuously and digitally stored.  Video monitoring was available and reviewed as appropriate.  Description: EEG showed continuous generalized 3 to 6 Hz theta-delta slowing. Hyperventilation and photic stimulation were not performed.     ABNORMALITY - Continuous slow, generalized  IMPRESSION: This study is suggestive of moderate to severe diffuse encephalopathy. No seizures or epileptiform discharges were seen throughout the recording.  Yoshino Broccoli Annabelle Harman

## 2023-04-11 NOTE — Progress Notes (Signed)
NEUROLOGY CONSULT FOLLOW UP NOTE   Date of service: April 11, 2023 Patient Name: Cory Hess MRN:  [id number] DOB:  August 08, 1945  Interval Hx/subjective  Intubated and sedated on propofol at a rate of 40.   Vitals   Vitals:   04/11/23 1845 04/11/23 1900 04/11/23 1915 04/11/23 2049  BP:      Pulse: 98 96 90   Resp: (!) 28 17 (!) 0   Temp:      TempSrc:      SpO2: 93% 96% 96% 95%  Weight:      Height:         Body mass index is 31.96 kg/m.  Physical Exam   Physical Exam HEENT- [place]/AT   Lungs- Intubated Extremities- Upper extremity limb edema is noted.   Neurological Examination Mental Status: Sedated on propofol at a rate of 40. No cortically-mediated responses except for possible slight grimace to noxious plantar stimulation. Cranial Nerves: II: Pupils are equal, round, 2 mm and sluggishly reactive. [name],IV, VI: No oculocephalic reflex (on sedation) V, VII: Trace corneal reflexes (on sedation) VIII: No response to voice IX,X: Intubated XI: Symmetric XII: Unable to assess Motor/Sensory: RUE: Flaccid with no movement to light pinch LUE: Flaccid with no movement to light pinch RLE: Minimal flexion of knee and ankle to noxious plantar stimulation LLE: Minimal flexion of knee and ankle to noxious plantar stimulation Deep Tendon Reflexes: Brisk, low amplitude reflexes x 4 Plantars: Mute bilaterally Cerebellar/Gait: Unable to assess   Medications  Current Facility-Administered Medications:    0.9 %  sodium chloride infusion (Manually program via Guardrails IV Fluids), , Intravenous, Continuous, End, Christopher, MD, Last Rate: 10 mL/hr at 04/11/23 1922, Infusion Verify at 04/11/23 1922   0.9 %  sodium chloride infusion (Manually program via Guardrails IV Fluids), , Intravenous, PRN, End, Cristal Deer, MD   acetaminophen (TYLENOL) tablet 650 mg, 650 mg, Per Tube, Q4H PRN, Madueme, Elvira C, RPH   acetaminophen (TYLENOL) tablet 650 mg, 650 mg, Per Tube, Q6H, Assaker,  Jean-Pierre, MD, 650 mg at 04/11/23 1814   aspirin chewable tablet 81 mg, 81 mg, Per Tube, Daily, Madueme, Elvira C, RPH, 81 mg at 04/11/23 0913   atorvastatin (LIPITOR) tablet 80 mg, 80 mg, Per Tube, Daily, Brynda Peon L, NP, 80 mg at 04/11/23 7829   Chlorhexidine Gluconate Cloth 2 % PADS 6 each, 6 each, Topical, QHS, Assaker, Jean-Pierre, MD   clopidogrel (PLAVIX) tablet 75 mg, 75 mg, Per Tube, Daily, Assaker, Jean-Pierre, MD, 75 mg at 04/11/23 0913   docusate (COLACE) 50 MG/5ML liquid 100 mg, 100 mg, Per Tube, BID, Ezequiel Essex, NP, 100 mg at 04/11/23 0913   enoxaparin (LOVENOX) injection 50 mg, 50 mg, Subcutaneous, Q24H, Kasa, Kurian, MD, 50 mg at 04/11/23 1410   famotidine (PEPCID) IVPB 20 mg premix, 20 mg, Intravenous, Q24H, Ezequiel Essex, NP, Stopped at 04/11/23 1844   feeding supplement (PROSource TF20) liquid 60 mL, 60 mL, Per Tube, BID, Assaker, Jean-Pierre, MD, 60 mL at 04/11/23 0913   feeding supplement (VITAL HIGH PROTEIN) liquid 1,000 mL, 1,000 mL, Per Tube, Continuous, Assaker, Jean-Pierre, MD, Last Rate: 60 mL/hr at 04/11/23 1922, Infusion Verify at 04/11/23 1922   free water 100 mL, 100 mL, Per Tube, Q4H, Lowella Bandy, RPH, 100 mL at 04/11/23 1814   hydrALAZINE (APRESOLINE) injection 10 mg, 10 mg, Intravenous, Q2H PRN, Hammock, Sheri, NP, 10 mg at 04/10/23 1609   insulin aspart (novoLOG) injection 0-6 Units, 0-6 Units, Subcutaneous, Q4H, Ezequiel Essex, NP,  1 Units at 04/07/23 0331   ipratropium-albuterol (DUONEB) 0.5-2.5 (3) MG/3ML nebulizer solution 3 mL, 3 mL, Nebulization, Q6H PRN, Ezequiel Essex, NP   lidocaine (LIDODERM) 5 % 1 patch, 1 patch, Transdermal, Q24H, Assaker, Jean-Pierre, MD, 1 patch at 04/11/23 1154   norepinephrine (LEVOPHED) 16 mg in (0.064 mg/mL) premix infusion, 0-40 mcg/min, Intravenous, Titrated, Andrey Farmer, PA-C, Stopped at 04/11/23 0855   ondansetron (ZOFRAN) injection 4 mg, 4 mg, Intravenous, Q6H PRN, End, Cristal Deer, MD   Oral care  mouth rinse, 15 mL, Mouth Rinse, Q2H, End, Christopher, MD, 15 mL at 04/11/23 2000   Oral care mouth rinse, 15 mL, Mouth Rinse, PRN, End, Cristal Deer, MD   oxyCODONE (Oxy IR/ROXICODONE) immediate release tablet 10 mg, 10 mg, Per Tube, Q6H, Assaker, [place], MD, 10 mg at 04/11/23 1814   polyethylene glycol (MIRALAX / GLYCOLAX) packet 17 g, 17 g, Per Tube, Daily, Ezequiel Essex, NP, 17 g at 04/11/23 0913   propofol (DIPRIVAN) 1000 MG/100ML infusion, 5-80 mcg/kg/min, Intravenous, Titrated, Dahlia Byes, NP, Last Rate: 25.2 mL/hr at 04/11/23 1823, 40 mcg/kg/min at 04/11/23 1823   sodium chloride flush (NS) 0.9 % injection 10-40 mL, 10-40 mL, Intracatheter, Q12H, End, Christopher, MD, 10 mL at 04/11/23 0914   sodium chloride flush (NS) 0.9 % injection 10-40 mL, 10-40 mL, Intracatheter, PRN, End, Cristal Deer, MD   sodium chloride flush (NS) 0.9 % injection 10-40 mL, 10-40 mL, Intracatheter, Q12H, Bensimhon, Bevelyn Buckles, MD, 10 mL at 04/11/23 0914   sodium chloride flush (NS) 0.9 % injection 10-40 mL, 10-40 mL, Intracatheter, PRN, Bensimhon, Bevelyn Buckles, MD  Facility-Administered Medications Ordered in Other Encounters:    sodium chloride flush (NS) 0.9 % injection 10 mL, 10 mL, Intravenous, PRN, Louretta Shorten R, MD   sodium chloride flush (NS) 0.9 % injection 10 mL, 10 mL, Intravenous, PRN, Earna Coder, MD, 10 mL at 11/30/22 1510  Labs and Diagnostic Imaging   CBC:  Recent Labs  Lab 04/05/23 1743 04/06/23 0316 04/10/23 0421 04/11/23 0406  WBC 29.0*   < > 9.6 10.6*  NEUTROABS 25.2*  --   --   --   HGB 12.8*   < > 8.8* 8.8*  HCT 37.3*   < > 25.6* 27.0*  MCV 98.4   < > 99.2 103.4*  PLT 277   < > 121* 127*   < > = values in this interval not displayed.    Basic Metabolic Panel:  Lab Results  Component Value Date   NA 148 (H) 04/11/2023   K 3.2 (L) 04/11/2023   CO2 24 04/11/2023   GLUCOSE 132 (H) 04/11/2023   BUN 51 (H) 04/11/2023   CREATININE 1.50 (H) 04/11/2023    CALCIUM 8.4 (L) 04/11/2023   GFRNONAA 48 (L) 04/11/2023   GFRAA >60 12/21/2018   Lipid Panel:  Lab Results  Component Value Date   LDLCALC 25 04/07/2023   HgbA1c:  Lab Results  Component Value Date   HGBA1C 5.9 (H) 04/05/2023   Urine Drug Screen: No results found for: "LABOPIA", "COCAINSCRNUR", "LABBENZ", "AMPHETMU", "THCU", "LABBARB"  Alcohol Level No results found for: "ETH" INR  Lab Results  Component Value Date   INR 1.4 (H) 04/05/2023   APTT  Lab Results  Component Value Date   APTT 37 (H) 04/05/2023     Assessment  78 year old gentleman with a past medical history significant for CAD status post CABG, alternating left and right bundle-branch blocks, hyperlipidemia, hypertension, heart failure, 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  He presented with recurrent cardiac arrest lasting 2 hrs. He was taken emergently to the Cath Lab for temporary transvenous pacemaker placement, emergent cardiac cath, and Swan-Ganz catheter placement.  Cardiac cath revealed severe native CAD with a patent LIMA-LAD and SVG-OM.  SVG to RCA was occluded but they were able to revascularize it.  Patient was admitted to [facility] ICU after that cardiac cath. When he initially arrived to the ICU patient was able to follow commands but he subsequently stopped doing so. On Neurology exam Saturday he opened eyes to loud voice but did not attend to or track examiner. Brainstem reflexes were all intact. He was able to follow commands on the appropriate sides but only the command wiggle your toes.   - Exam today under propofol sedation at a rate of 40 reveals minimal withdrawal of BLE to noxious, sluggishly reactive pupils, trace corneals and absent oculocephalic reflex.  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   Mild chronic small-vessel ischemic change of the cerebral hemispheric white matter with a few old small cortical infarctions scattered about the right hemisphere suggesting chronic/recurrent micro embolic infarctions.  - EEG obtained today: Continuous slow, generalized. This study is suggestive of moderate to severe diffuse encephalopathy. No seizures or epileptiform discharges were seen throughout the recording. - Regarding prognostication, his MRI brain shows no definitive signs of anoxic damage.  On a previous exam he awakened to voice although initially was not following commands. Brainstem reflexes have been intact on prior exams with today's findings likely secondary to propofol sedation.   - The plan continues to be to hold sedation for 24 hours to give a clean neurologic exam to see what he was able to do.  Sedation was held Saturday in the early afternoon and over the next few hours he did start to follow commands consistently.  Unfortunately he failed his SBT and had to be resedated   Recommendations  - ASA 81mg  daily + plavix 75mg  daily x21 days f/b ASA 81mg  daily monotherapy after that. 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  - Continue atorvastatin 80mg  daily; LDL is at goal - Neurology has ordered carotid US. He will also likely need CTA or MRA H&N when patient stable to undergo; attn to R carotid as source of embolism - Plan for wake-up exam when patient has been successfully weaned from sedation (ideally 24 off sedation prior to exam)  35 minutes spent in the neurological evaluation and management of this critically ill patient ______________________________________________________________________   Cory Hess, Cory Heffern, MD [facility]

## 2023-04-11 NOTE — Progress Notes (Addendum)
Nutrition Follow Up Note   DOCUMENTATION CODES:   Obesity unspecified  INTERVENTION:   Continue Vital HP @60ml /hr + ProSource TF 20- Give 60ml BID via tube   Free water flushes q4 hours    Regimen provides 1600kcal/day, 166g/day protein and 1897ml/day of free water.     Daily weights   NUTRITION DIAGNOSIS:   Inadequate oral intake related to inability to eat (pt sedated and ventilated) as evidenced by NPO status. -ongoing   GOAL:   Provide needs based on ASPEN/SCCM guidelines -met   MONITOR:   Vent status, Labs, Weight trends, TF tolerance, I & O's, Skin  ASSESSMENT:   78 yo male with h/o CAD (s/p CABG x3 on 07/19), alternating LBBB & RBBB, HLD, HTN, CHF, left tonsil cancer-stage II HPV with metastatic lymphadenopathy s/p chemo/radiation (dx 08/2022), bladder tumor s/p TURP, hernia repair, MI and ischemic cardiomyopathy who is admitted with UTI, AKI, septic shock, heart block s/p pacemaker 2/11 and cardiac arrest.  Pt remains sedated and ventilated. OGT in place and pt is tolerating tube feeds well at goal rate. Per chart, pt appears weight stable since admission. Pt  +1.2L on his I & Os. Plan is for possible trach/PEG tube.    Medications reviewed and include: aspirin, plavix, colace, lovenox, insulin, oxycodone, miralax, pepcid  Labs reviewed: Na 148(H), K 3.2(L), BUN 51(H), creat 1.50(H), P 2.8 wnl, Mg 2.2 wnl Wbc- 10.6(H), Hgb 8.8(L), Hct 27.0(L) Cbgs- 80, 96, 118 x 24 hrs   Patient is currently intubated on ventilator support MV: 15.8 L/min Temp (24hrs), Avg:98.9 F (37.2 C), Min:98.5 F (36.9 C), Max:100.2 F (37.9 C)  Propofol- currently on hold   MAP >29mmHg   UOP-   NUTRITION - FOCUSED PHYSICAL EXAM:  Flowsheet Row Most Recent Value  Orbital Region Mild depletion  Upper Arm Region No depletion  Thoracic and Lumbar Region No depletion  Buccal Region No depletion  Temple Region Moderate depletion  Clavicle Bone Region No depletion   Clavicle and Acromion Bone Region No depletion  Scapular Bone Region No depletion  Dorsal Hand No depletion  Patellar Region No depletion  Anterior Thigh Region No depletion  Posterior Calf Region No depletion  Edema (RD Assessment) Mild  Hair Reviewed  Eyes Reviewed  Mouth Reviewed  Skin Reviewed  Nails Reviewed   Diet Order:   Diet Order             Diet NPO time specified  Diet effective now                  EDUCATION NEEDS:   No education needs have been identified at this time  Skin:  Skin Assessment: Reviewed RN Assessment (ecchymosis)  Last BM:  2/15- TYPE 5  Height:   Ht Readings from Last 1 Encounters:  04/05/23 6' (1.829 m)    Weight:   Wt Readings from Last 1 Encounters:  04/11/23 106.9 kg    Ideal Body Weight:  80.9 kg  BMI:  Body mass index is 31.96 kg/m.  Estimated Nutritional Needs:   Kcal:  1900-2200kcal/day  Protein:  >160g/day  Fluid:  2.1-2.4L/day  Betsey Holiday MS, RD, LDN If unable to be reached, please send secure chat to "RD inpatient" available from 8:00a-4:00p daily

## 2023-04-11 NOTE — Progress Notes (Addendum)
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  He presented to [facility] ER via EMS from [facility] following a witnessed cardiac arrest.  Bystander CPR initiated and EMS arrived on the scene within 30 seconds cardiac rhythm asystole.  ROSC initially achieved, however pt in and out of PEA/asystole.    Pt recently evaluated in the ER on 02/4 with urinary urgency and frequency that started 5 days prior to that presentation.  Diagnosed with a UTI and prescribed Bactrim DS twice daily for 10 days.  However, symptoms persisted prompting [facility] visit today.  ED Course  Upon arrival to the ER pt on NRB with pulse.  It was reported during chest compressions the pt began to fight and pulling at medical devices.  While in the the ER waiting room the pt cardiac arrested again requiring ACLS protocol and mechanical intubation.  Pt with varied cardiac rhythm during cardiac arrest PEA/asystole/complete heart block.  He required transcutaneously pacing.  EKG showed apparent sinus rhythm with complete heart block and ventricular escape rhythm with LBBB morphology.  Total downtime estimated 2hrs from out of hospital cardiac arrest and inpatient cardiac arrest.  Pt required epinephrine gtt due to bradycardia and hypotension.  Pt taken emergently to the cardiac cath lab for temporary transvenous pacemaker placement, emergent cardiac catheterization, and swan-ganz catheter placement.   Cardiac cath revealed severe native CAD with patent LIMA-LAD and SVG-OM. SVG-RCA is occluded, but did not appear  acute and unlikely the inciting factor for today's cardiac arrest.  Per cardiology if pt makes a meaningful recovery he may require revascularization of the RCA.  Pt admitted to the ICU post cardiac cath PCCM consulted to asume care.    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  Worsening AKI and mild hyperkalemia, given shifting measures.  Low threshold for Nephrology consultation. 02/13: Weaning down vasopressors, Vasopressin off, Levo @ 7, Epi @ 6.  On minimal vent support.  Renal function and hyperkalemia slowly improving.  Advanced Heart Failure diuresing with 40 mg IV Lasix x1 dose for CVP of 15.  Perform WUA. 02/14: Weaned off vasopressors  On minimal vent support, failed WUA due to        severe hypertension and increased WOB.  On WUA only opening eyes, not        following commands.  MRI Brain revealed 2 small acute infarctions affecting the        cortical brain in theright frontal region.Theone Murdoch, temporary pacer and right        femoral venous sheath removed.  AKI slowly improving.  Heparin gtt discontinued  given low suspicion for PE.  To have PICC placed per Heart Failure team request. 02/15: Pt remains mechanically intubated.  All sedation off brainstem reflexes intact and opens eyes to voice.  Mentation precluding extubation.  Neurology consulted 02/16: Overnight pt developed worsening acute respiratory  failure with accessory muscle use, tachycardia, and hypoxia requiring bagging via BVM, therefore propofol gtt restarted.  Neurology recommending MRA Head/Neck and EEG once stable to transfer.  Performing WUA and possible SBT today  2/17: No significant events overnight, mental status still lagging. stroke work up pending per neuro recs  Interim History / Subjective:  As outlined above under Significant Hospital Events section  Micro Data:  2/11: COVID-19 PCR>> negative 2/11: MRSA PCR>> negative 2/11: Urine>>no growth 2/12: Blood cultures x2>>no growth to date  Antimicrobials:   Anti-infectives (From admission, onward)    Start     Dose/Rate Route Frequency Ordered Stop   04/06/23 1100  vancomycin (VANCOREADY) IVPB 2000 mg/400 mL        2,000 mg 200 mL/hr over 120 Minutes Intravenous  Once 04/06/23 1006 04/06/23 1253   04/06/23 1100  piperacillin-tazobactam (ZOSYN) IVPB 3.375 g        3.375 g 12.5 mL/hr over 240 Minutes Intravenous Every 8 hours 04/06/23 1006 04/11/23 1359   04/05/23 1830  cefTRIAXone (ROCEPHIN) 2 g in sodium chloride 0.9 % 100 mL IVPB  Status:  Discontinued        2 g 200 mL/hr over 30 Minutes Intravenous Every 24 hours 04/05/23 1736 04/06/23 0827      Objective   Blood pressure (!) 145/89, pulse 97, temperature 98.8 F (37.1 C), temperature source Axillary, resp. rate (!) 21, height 6' (1.829 m), weight 104.7 kg, SpO2 92%. CVP:  [0 mmHg-17 mmHg] 10 mmHg  Vent Mode: PRVC FiO2 (%):  [30 %-50 %] 50 % Set Rate:  [22 bmp-24 bmp] 22 bmp Vt Set:  [450 mL-500 mL] 500 mL PEEP:  [5 cmH20-8 cmH20] 5 cmH20 Pressure Support:  [5 cmH20] 5 cmH20   Intake/Output Summary (Last 24 hours) at 04/11/2023 0350 Last data filed at 04/11/2023 0200 Gross per 24 hour  Intake 3565.59 ml  Output 1980 ml  Net 1585.59 ml   Filed Weights   04/08/23 0320 04/09/23 0324 04/10/23 0400  Weight: 106.9 kg 104.2 kg 104.7 kg   Examination: General: Acute on chronically-ill appearing male,  NAD mechanically intubated  HENT: Supple, mild JVD  Lungs: Faint rhonchi throughout, even, non labored, overbreathing the vent Cardiovascular: RRR, no m/r/g, 2+ radial/1+ distal pulses, bilateral extremities cool to touch  Abdomen: +BS x4, obese, soft non distended  Extremities: Normal bulk and tone, no deformities Neuro: Sedated, opens eyes to voice, PERRL, +cough/gag reflexes GU: Indwelling foley catheter draining yellow urine   Resolved Hospital Problem list   Anion gap metabolic acidosis  Lactic acidosis   Assessment & Plan:  #Acute metabolic encephalopathy  #Mechanical intubation pain/discomfort  MR Brain 02/14: 2 small acute infarctions affecting the cortical brain in the right frontal region.  Mild chronic small-vessel ischemic change of the cerebral hemispheric white matter with a few old small cortical infarctions scattered about the right hemisphere suggesting chronic/recurrent micro embolic infarctions. Recommend carotid evaluation. 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 SVG-RCA is occluded, but did not appear acute and unlikely the inciting factor for today's cardiac arrest Echocardiogram 04/07/23:  LVEF 40-45%, mild LVH, Grade I DD, RV systolic function not well visualized, RV size normal - Continuous telemetry monitoring -  Maintain map 65 or higher  - Lactic acid has normalized - Trend HS Troponin until peaked - Trend Coox panel - Diuresis as BP and renal function permits - Cardiology following, appreciate input: recommending if pt has a meaningful recovery may require revascularization of the RCA and if junctional rhythm persist start amiodarone gtt - Heparin gtt discontinued 2/14 - Advanced heart failure team following along - Bidil started for bp control per cardiology for bp control  - Resumed clopidogrel 02/16 and continue atorvastatin  - Thyroid elevated at 18, but free T4 normal at 0.93  #Acute hypoxic hypercapnic respiratory failure  #Possible PE~low suspicion  #Aspiration pneumonia secondary to acute encephalopathy post cardiac arrest  #Mechanical intubation  - Full vent support, implement lung protective strategies - Plateau pressures less than 30 cm H20 - Wean FiO2 & PEEP as tolerated to maintain O2 sats >92% - Follow intermittent Chest X-ray & ABG as needed - Spontaneous Breathing Trials when respiratory parameters met and mental status permits - VAP bundle implemented  - Prn Bronchodilators - Unable to perform CTA PE Chest due to acute kidney injury consider lung VQ scan once stabilized to travel ~ Venous [place] of Bilateral LE's negative for DVT, Echo with no evidence of RV strain - Venous US RUE pending to r/o VTE   #Acute kidney injury secondary to ATN~improving  #Hypokalemia  - Trend BMP  - Strict I&O's  - Ensure adequate renal perfusion - Avoid nephrotoxic agents as able - Replace electrolytes as indicated ~ Pharmacy following for assistance with electrolyte replacement  #UTI  #Aspiration pneumonia  - Trend WBC and monitor fever curve - Trend PCT  - Follow cultures  - Continue abx as outlined above   #Transaminitis~improving   - Trend hepatic function panel  - Avoid hepatotoxic medications as able   #Hyperglycemia  - Hemoglobin A1c 02/11: 5.9 - CBG's q4hrs  - Very sensitive  SSI  - Follow hyper/hypoglycemic protocol  - Target CBG readings 140 to 180   Best Practice (right click and "Reselect all SmartList Selections" daily)   Diet/type: tube feeds DVT prophylaxis SQ Heparin Pressure ulcer(s): N/A GI prophylaxis: H2B Lines: Portacath present on admission and Left Upper Extremity PICC and still needed Foley:  Yes, and it is still needed Code Status:  full code Last date of multidisciplinary goals of care discussion [04/10/23]  2/16: Will update pts sisters when they arrive at bedside regarding pts condition and current plan of care.  Labs   CBC: Recent Labs  Lab 04/05/23 1743 04/06/23 0316 04/07/23 0401 04/08/23 0412 04/09/23 0413 04/10/23 0421  WBC 29.0* 34.5* 29.7* 24.0* 15.9* 9.6  NEUTROABS 25.2*  --   --   --   --   --   HGB 12.8* 13.0 10.2* 8.5* 8.4* 8.8*  HCT 37.3* 38.2* 30.8* 25.2* 24.5* 25.6*  MCV 98.4  97.7 99.0 99.2 102.1* 99.2  PLT 277 291 176 132* 128* 121*    Basic Metabolic Panel: Recent Labs  Lab 04/06/23 0316 04/06/23 0603 04/07/23 0401 04/07/23 1802 04/08/23 0412 04/08/23 1800 04/09/23 0413 04/09/23 1453 04/10/23 0418 04/10/23 0421 04/10/23 1502  NA 140   < > 137 140 141 144 142  --  146*  --   --   K 7.1*   < > 5.3* 4.2 3.7 3.6 2.9* 3.6 2.6*  --  3.1*  CL 105   < > 105 105 106 106 105  --  109  --   --   CO2 24   < > 23 26 26 26 29   --  26  --   --   GLUCOSE 162*   < > 168* 87 105* 97 107*  --  117*  --   --   BUN 47*   < > 50* 50* 50* 54* 57*  --  59*  --   --   CREATININE 2.78*   < > 2.30* 2.28* 2.17* 2.03* 2.04*  --  1.72*  --   --   CALCIUM 10.5*   < > 9.0 8.7* 8.4* 8.6* 8.3*  --  8.2*  --   --   MG 2.1  --  1.8  --  2.2  --  2.1  --   --  2.0  --   PHOS 4.0  --  4.4  --  3.5  --  4.0  --   --  2.9  --    < > = values in this interval not displayed.   GFR: Estimated Creatinine Clearance: 45.7 mL/min (A) (by C-G formula based on SCr of 1.72 mg/dL (H)). Recent Labs  Lab 04/05/23 1658 04/05/23 1743  04/06/23 0316 04/06/23 1042 04/06/23 1408 04/07/23 0401 04/07/23 0758 04/08/23 0412 04/09/23 0413 04/09/23 1453 04/10/23 0421  PROCALCITON 0.28  --  7.41  --   --   --   --   --   --  1.70  --   WBC  --    < > 34.5*  --   --  29.7*  --  24.0* 15.9*  --  9.6  LATICACIDVEN 2.9*   < > 3.0* 3.7* 2.8*  --  1.5  --   --   --   --    < > = values in this interval not displayed.    Liver Function Tests: Recent Labs  Lab 04/05/23 1251 04/05/23 1703 04/07/23 0758 04/09/23 1453  AST 83* 273* 83* 37  ALT 134* 300* 146* 64*  ALKPHOS 99 124 80 73  BILITOT 0.8 0.9 0.7 0.9  PROT 6.8 5.9* 5.5* 5.3*  ALBUMIN 3.5 2.9* 2.6* 2.4*   No results for input(s): "LIPASE", "AMYLASE" in the last 168 hours. No results for input(s): "AMMONIA" in the last 168 hours.  ABG    Component Value Date/Time   PHART 7.49 (H) 04/10/2023 0800   PCO2ART 39 04/10/2023 0800   PO2ART 103 04/10/2023 0800   HCO3 29.7 (H) 04/10/2023 0800   TCO2 33 (H) 04/05/2023 1459   ACIDBASEDEF 1.5 04/07/2023 0500   O2SAT 100 04/10/2023 0800     Coagulation Profile: Recent Labs  Lab 04/05/23 1703  INR 1.4*    Cardiac Enzymes: No results for input(s): "CKTOTAL", "CKMB", "CKMBINDEX", "TROPONINI" in the last 168 hours.  HbA1C: Hgb A1c MFr Bld  Date/Time Value Ref Range Status  04/05/2023 07:38 PM 5.9 (H) 4.8 - 5.6 %  Final    Comment:    (NOTE) Pre diabetes:          5.7%-6.4%  Diabetes:              >6.4%  Glycemic control for   <7.0% adults with diabetes   08/28/2017 02:27 AM 5.6 4.8 - 5.6 % Final    Comment:    (NOTE) Pre diabetes:          5.7%-6.4% Diabetes:              >6.4% Glycemic control for   <7.0% adults with diabetes     CBG: Recent Labs  Lab 04/10/23 0742 04/10/23 1124 04/10/23 1556 04/10/23 2010 04/10/23 2332  GLUCAP 101* 94 93 98 72    Review of Systems:   Unable to assess pt mechanically intubated   Past Medical History:  He,  has a past medical history of Alternating RBBB  & LBBB, Ankle swelling (2024), Arthritis, Bladder tumor, CAD (coronary artery disease), Cancer (HCC), Cancer of base of tongue (HCC), Essential hypertension, 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   Surgical History:   Past Surgical History:  Procedure Laterality Date   CARDIAC CATHETERIZATION     CATARACT EXTRACTION W/PHACO Left 07/18/2018   Procedure: CATARACT EXTRACTION PHACO AND INTRAOCULAR LENS PLACEMENT (IOC)  LEFT;  Surgeon: Nevada Crane, MD;  Location: [facility];  Service: Ophthalmology;  Laterality: Left;   CATARACT EXTRACTION W/PHACO Right 08/14/2018   Procedure: CATARACT EXTRACTION PHACO AND INTRAOCULAR LENS PLACEMENT (IOC)  RIGHT;  Surgeon: Nevada Crane, MD;  Location: [facility];  Service: Ophthalmology;  Laterality: Right;   CORONARY ARTERY BYPASS GRAFT N/A 08/27/2017   Procedure: CORONARY ARTERY BYPASS GRAFTING (CABG) ON PUMP USING LEFT INTERNAL MAMMARY ARTERY AND LEFT GREATER SAPHENOUS VEIN VIA ENDOVEIN HARVEST;  Surgeon: Alleen Borne, MD;  Location: [facility] OR;  Service: Open Heart Surgery;  Laterality: N/A;   CORONARY/GRAFT ACUTE MI REVASCULARIZATION N/A 08/27/2017   Procedure: Coronary/Graft Acute MI Revascularization;  Surgeon: Iran Ouch, MD;  Location: [facility] INVAS[facility] LAB;  Service: Cardiovascular;  Laterality: N/A;   CYSTOSCOPY W/ RETROGRADES Bilateral 12/25/2018   Procedure: CYSTOSCOPY WITH RETROGRADE PYELOGRAM;  Surgeon: Vanna Scotland, MD;  Location: [facility] ORS;  Service: Urology;  Laterality: Bilateral;   FRACTURE SURGERY Right 1958   arm and left wrist compound fracture, no metal   HERNIA REPAIR Right    inguinial   IR IMAGING GUIDED PORT INSERTION  10/15/2022   LEFT HEART CATH AND CORONARY ANGIOGRAPHY N/A 08/27/2017   Procedure: LEFT HEART CATH AND CORONARY ANGIOGRAPHY;  Surgeon: Iran Ouch,  MD;  Location: [facility] INV[facility] CV LAB;  Service: Cardiovascular;  Laterality: N/A;   RIGHT/LEFT HEART CATH AND CORONARY/GRAFT ANGIOGRAPHY N/A 04/05/2023   Procedure: RIGHT/LEFT HEART CATH AND CORONARY/GRAFT ANGIOGRAPHY;  Surgeon: Yvonne Kendall, MD;  Location: [facility];  Service: Cardiovascular;  Laterality: N/A;   TEMPORARY PACEMAKER N/A 04/05/2023   Procedure: TEMPORARY PACEMAKER;  Surgeon: Yvonne Kendall, MD;  Location: [facility];  Service: Cardiovascular;  Laterality: N/A;   TRANSURETHRAL RESECTION OF BLADDER TUMOR WITH MITOMYCIN-C N/A 12/25/2018   Procedure: TRANSURETHRAL RESECTION OF BLADDER TUMOR WITH Gemcitabine;  Surgeon: Vanna Scotland, MD;  Location: [facility] ORS;  Service: Urology;  Laterality: N/A;     Social History:   reports that he quit smoking about 25 years ago. His smoking use included cigarettes.  His smokeless tobacco use includes snuff. He reports that he does not currently use alcohol. He reports that he does not currently use drugs.   Family History:  His family history includes Cervical cancer in his maternal grandmother; Heart failure in his mother; Lung disease in his father; Stroke in his father.   Allergies No Known Allergies   Home Medications  Prior to Admission medications   Medication Sig Start Date End Date Taking? Authorizing Provider  acetaminophen (TYLENOL) 650 MG CR tablet Take 650 mg by mouth every 8 (eight) hours as needed for pain.    [provider]  aspirin EC 81 MG EC tablet Take 1 tablet (81 mg total) by mouth daily. 09/03/17   Gold, Glenice Laine, PA-C  atorvastatin (LIPITOR) 80 MG tablet Take 1 tablet by mouth once daily 02/08/23   Creig Hines, NP  carvedilol (COREG) 12.5 MG tablet TAKE 1 TABLET BY MOUTH TWICE DAILY WITH A MEAL 03/10/23   End, Cristal Deer, MD  lidocaine-prilocaine (EMLA) cream Apply on the port. 30 -45 min  prior to port access. 10/08/22   Earna Coder, MD  losartan (COZAAR) 100 MG  tablet Take 1 tablet by mouth once daily 03/10/23   End, Cristal Deer, MD  magic mouthwash (multi-ingredient) oral suspension Swish and spit 5-10 mLs 4 (four) times daily as needed. 11/24/22   Earna Coder, MD  Multiple Vitamin (MULTIVITAMIN WITH MINERALS) TABS tablet Take 1 tablet by mouth daily. One a Day over 50/ lunch    [provider]  nitroGLYCERIN (NITROSTAT) 0.4 MG SL tablet DISSOLVE ONE TABLET UNDER THE TONGUE EVERY 5 MINUTES AS NEEDED FOR CHEST PAIN.  DO NOT EXCEED A TOTAL OF 3 DOSES IN 15 MINUTES 03/24/23   Creig Hines, NP  OLANZapine (ZYPREXA) 5 MG tablet Take one pill at night. 11/03/22   Earna Coder, MD  ondansetron (ZOFRAN) 8 MG tablet One pill every 8 hours as needed for nausea/vomitting. 10/08/22   Earna Coder, MD  polyethylene glycol (MIRALAX) 17 g packet Take 17 g by mouth daily. 11/07/22   Romeo Apple, Myah A, PA-C  prochlorperazine (COMPAZINE) 10 MG tablet Take 1 tablet (10 mg total) by mouth every 6 (six) hours as needed for nausea or vomiting. 10/08/22   Earna Coder, MD  senna-docusate (SENOKOT-S) 8.6-50 MG tablet Take 1 tablet by mouth daily. 11/07/22   Romeo Apple, Myah A, PA-C  sucralfate (CARAFATE) 1 g tablet Take 1 tablet (1 g total) by mouth 4 (four) times daily -  with meals and at bedtime. Dissolve in 4 tablespoons warm water 30 minutes before meals. 10/26/22   Carmina Miller, MD  sulfamethoxazole-trimethoprim (BACTRIM DS) 800-160 MG tablet Take 1 tablet by mouth 2 (two) times daily for 10 days. 03/29/23 04/08/23  Becky Augusta, NP  torsemide (DEMADEX) 20 MG tablet Take 1 tablet (20 mg total) by mouth daily as needed (swelling and weight gain). 11/25/22 02/23/23  Creig Hines, NP  traMADol HCl 100 MG TABS Take 100 mg by mouth every 8 (eight) hours as needed. 10/26/22   Earna Coder, MD  Scheduled Meds:  acetaminophen  650 mg Per Tube Q6H   aspirin  81 mg Per Tube Daily   atorvastatin  80 mg Per Tube Daily    Chlorhexidine Gluconate Cloth  6 each Topical QHS   clopidogrel  75 mg Per Tube Daily   docusate  100 mg Per Tube BID   feeding supplement (PROSource TF20)  60 mL Per Tube  BID   free water  30 mL Per Tube Q4H   heparin injection (subcutaneous)  5,000 Units Subcutaneous Q8H   insulin aspart  0-6 Units Subcutaneous Q4H   isosorbide-hydrALAZINE  0.5 tablet Per Tube Q6H   lidocaine  1 patch Transdermal Q24H   mouth rinse  15 mL Mouth Rinse Q2H   oxyCODONE  10 mg Per Tube Q6H   polyethylene glycol  17 g Per Tube Daily   sodium chloride flush  10-40 mL Intracatheter Q12H   sodium chloride flush  10-40 mL Intracatheter Q12H   Continuous Infusions:  sodium chloride 10 mL/hr at 04/11/23 0200   famotidine (PEPCID) IV Stopped (04/10/23 1857)   feeding supplement (VITAL HIGH PROTEIN) 60 mL/hr at 04/11/23 0200   norepinephrine (LEVOPHED) Adult infusion 4 mcg/min (04/11/23 0200)   piperacillin-tazobactam (ZOSYN)  IV Stopped (04/11/23 0124)   propofol (DIPRIVAN) infusion 60 mcg/kg/min (04/11/23 0352)   PRN Meds:.sodium chloride, acetaminophen, hydrALAZINE, ipratropium-albuterol, ondansetron (ZOFRAN) IV, mouth rinse, sodium chloride flush, sodium chloride flush   Critical care time: 40 minutes        Webb Silversmith, DNP, CCRN, FNP-C, AGACNP-BC Acute Care & Family Nurse Practitioner  [facility]  See Amion for personal pager PCCM on call pager [phone] until 7 am

## 2023-04-11 NOTE — Progress Notes (Signed)
 Eeg done

## 2023-04-11 NOTE — Progress Notes (Signed)
PHARMACY CONSULT NOTE - FOLLOW UP  Pharmacy Consult for Electrolyte Monitoring and Replacement   Recent Labs: Potassium (mmol/L)  Date Value  04/11/2023 3.2 (L)   Magnesium (mg/dL)  Date Value  29/56/2130 2.2   Calcium (mg/dL)  Date Value  86/57/8469 8.4 (L)   Albumin (g/dL)  Date Value  62/95/2841 2.4 (L)  04/01/2021 4.2   Phosphorus (mg/dL)  Date Value  32/44/0102 2.8   Sodium (mmol/L)  Date Value  04/11/2023 148 (H)  10/05/2022 141   Assessment: DC is a 78 yo male who presented to [facility] ED post cardiac arrest. Their downtime was 2 hours. Pharmacy has been consult to manage this patient's electrolytes while in the ICU.   Fluids:  Nutrition: Vital high protein 84mL/hr + free water 30 mL Q4H Pertinent Medications: Norepi + propofol + vaso  Goal of Therapy:  Electrolytes WNL K = 3.2 Mg = 2.2 Phos = 2.8   Plan:  Give 40 mEq KCl packet per tube x 1 No other replacement indicated at this time Continue to monitor with AM labs   Effie Shy, PharmD Pharmacy Resident  04/11/2023 6:34 AM

## 2023-04-11 NOTE — Progress Notes (Signed)
Daily Progress Note   Patient Name: Cory Hess       Date: 04/11/2023 DOB: Aug 05, 1945  Age: 78 y.o. MRN#: [id number] Attending Physician: Erin Fulling, MD Primary Care Physician: Patient, No Pcp Per Admit Date: 04/05/2023  Reason for Consultation/Follow-up: Establishing goals of care  Subjective: Notes and labs reviewed.  Neurology following.  Patient is currently pending carotid ultrasound.  Will need further workup if patient becomes stable to complete.  Patient is currently on day 6 of intubation. In to see patient.  He is currently resting in bed on ventilator.  No family at bedside at this time.  Family has desired time for outcomes in hopes of improvement.  Would recommend introducing discussions regarding wishes on tracheostomy, and discussion of acceptable quality of life and any boundaries on care.  Length of Stay: 6  Current Medications: Scheduled Meds:    stroke: early stages of recovery book   Does not apply Once   acetaminophen  650 mg Per Tube Q6H   aspirin  81 mg Per Tube Daily   atorvastatin  80 mg Per Tube Daily   Chlorhexidine Gluconate Cloth  6 each Topical QHS   clopidogrel  75 mg Per Tube Daily   docusate  100 mg Per Tube BID   feeding supplement (PROSource TF20)  60 mL Per Tube BID   free water  100 mL Per Tube Q4H   heparin injection (subcutaneous)  5,000 Units Subcutaneous Q8H   insulin aspart  0-6 Units Subcutaneous Q4H   lidocaine  1 patch Transdermal Q24H   mouth rinse  15 mL Mouth Rinse Q2H   oxyCODONE  10 mg Per Tube Q6H   polyethylene glycol  17 g Per Tube Daily   sodium chloride flush  10-40 mL Intracatheter Q12H   sodium chloride flush  10-40 mL Intracatheter Q12H    Continuous Infusions:  sodium chloride 10 mL/hr at 04/11/23 0959   famotidine  (PEPCID) IV Stopped (04/10/23 1857)   feeding supplement (VITAL HIGH PROTEIN) 60 mL/hr at 04/11/23 0959   norepinephrine (LEVOPHED) Adult infusion Stopped (04/11/23 0855)   propofol (DIPRIVAN) infusion 60 mcg/kg/min (04/11/23 1000)    PRN Meds: sodium chloride, acetaminophen, hydrALAZINE, ipratropium-albuterol, ondansetron (ZOFRAN) IV, mouth rinse, sodium chloride flush, sodium chloride flush  Physical Exam Constitutional:      Comments: Eyes  closed  Pulmonary:     Comments: On ventilator            Vital Signs: BP (!) 134/59   Pulse 94   Temp 98.5 F (36.9 C) (Axillary)   Resp (!) 8   Ht 6' (1.829 m)   Wt 106.9 kg   SpO2 93%   BMI 31.96 kg/m  SpO2: SpO2: 93 % O2 Device: O2 Device: Ventilator O2 Flow Rate:    Intake/output summary:  Intake/Output Summary (Last 24 hours) at 04/11/2023 1152 Last data filed at 04/11/2023 1000 Gross per 24 hour  Intake 3053.05 ml  Output 1830 ml  Net 1223.05 ml   LBM: Last BM Date : (P) 04/10/23 Baseline Weight: Weight: 104.9 kg Most recent weight: Weight: 106.9 kg    Patient Active Problem List   Diagnosis Date Noted   Acute respiratory failure with hypoxia and hypercapnia (HCC) 04/10/2023   Acute CVA (cerebrovascular accident) (HCC) 04/09/2023   Hyperkalemia 04/06/2023   Acute kidney injury (HCC) 04/06/2023   Cardiac arrest (HCC) 04/05/2023   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   Acute on chronic HFrEF (heart failure with reduced ejection fraction) (HCC) 04/05/2023   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   Hematuria 11/06/2018   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   Coronary artery disease 08/28/2017   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 [facility])     Palliative Care Assessment & Plan    Recommendations/Plan: PMT will follow   Code Status:    Code Status Orders  (From admission, onward)           Start     Ordered   04/05/23 1709  Full code  Continuous       Question:  By:  Answer:   Default: patient does not have capacity for decision making, no surrogate or prior directive available   04/05/23 1708           Code Status History     Date Active Date Inactive Code Status Order ID Comments User Context   10/15/2022 1208 10/16/2022 0518 Full Code [id number]  Pernell Dupre, MD HOV   10/15/2022 1205 10/15/2022 1208 Full Code [id number]  Pernell Dupre, MD HOV   11/06/2018 0153 11/07/2018 1515 Full Code [id number]  Mansy, Vernetta Honey, MD ED   08/28/2017 0213 09/02/2017 2231 Full Code [id number]  Ardelle Balls, PA-C Inpatient           Thank you for allowing the Palliative Medicine Team to assist in the care of this patient.     Morton Stall, NP  Please contact Palliative Medicine Team phone at [phone] for questions and concerns.

## 2023-04-11 NOTE — Plan of Care (Signed)
Problem: Education: Goal: Knowledge of General Education information will improve Description: Including pain rating scale, medication(s)/side effects and non-pharmacologic comfort measures Outcome: Progressing   Problem: Health Behavior/Discharge Planning: Goal: Ability to manage health-related needs will improve Outcome: Progressing   Problem: Clinical Measurements: Goal: Ability to maintain clinical measurements within normal limits will improve Outcome: Progressing Goal: Will remain free from infection Outcome: Progressing Goal: Diagnostic test results will improve Outcome: Progressing Goal: Respiratory complications will improve Outcome: Progressing Goal: Cardiovascular complication will be avoided Outcome: Progressing   Problem: Activity: Goal: Risk for activity intolerance will decrease Outcome: Progressing   Problem: Nutrition: Goal: Adequate nutrition will be maintained Outcome: Progressing   Problem: Coping: Goal: Level of anxiety will decrease Outcome: Progressing   Problem: Elimination: Goal: Will not experience complications related to bowel motility Outcome: Progressing Goal: Will not experience complications related to urinary retention Outcome: Progressing   Problem: Pain Managment: Goal: General experience of comfort will improve and/or be controlled Outcome: Progressing   Problem: Safety: Goal: Ability to remain free from injury will improve Outcome: Progressing   Problem: Skin Integrity: Goal: Risk for impaired skin integrity will decrease Outcome: Progressing   Problem: Education: Goal: Understanding of CV disease, CV risk reduction, and recovery process will improve Outcome: Progressing   Problem: Activity: Goal: Ability to return to baseline activity level will improve Outcome: Progressing   Problem: Cardiovascular: Goal: Ability to achieve and maintain adequate cardiovascular perfusion will improve Outcome: Progressing Goal:  Vascular access site(s) Level 0-1 will be maintained Outcome: Progressing   Problem: Health Behavior/Discharge Planning: Goal: Ability to safely manage health-related needs after discharge will improve Outcome: Progressing   Problem: Education: Goal: Understanding of CV disease, CV risk reduction, and recovery process will improve Outcome: Progressing   Problem: Activity: Goal: Ability to return to baseline activity level will improve Outcome: Progressing   Problem: Cardiovascular: Goal: Ability to achieve and maintain adequate cardiovascular perfusion will improve Outcome: Progressing Goal: Vascular access site(s) Level 0-1 will be maintained Outcome: Progressing   Problem: Health Behavior/Discharge Planning: Goal: Ability to safely manage health-related needs after discharge will improve Outcome: Progressing   Problem: Education: Goal: Ability to describe self-care measures that may prevent or decrease complications (Diabetes Survival Skills Education) will improve Outcome: Progressing   Problem: Coping: Goal: Ability to adjust to condition or change in health will improve Outcome: Progressing   Problem: Fluid Volume: Goal: Ability to maintain a balanced intake and output will improve Outcome: Progressing   Problem: Health Behavior/Discharge Planning: Goal: Ability to identify and utilize available resources and services will improve Outcome: Progressing Goal: Ability to manage health-related needs will improve Outcome: Progressing   Problem: Metabolic: Goal: Ability to maintain appropriate glucose levels will improve Outcome: Progressing   Problem: Nutritional: Goal: Maintenance of adequate nutrition will improve Outcome: Progressing Goal: Progress toward achieving an optimal weight will improve Outcome: Progressing   Problem: Skin Integrity: Goal: Risk for impaired skin integrity will decrease Outcome: Progressing   Problem: Tissue Perfusion: Goal:  Adequacy of tissue perfusion will improve Outcome: Progressing   Problem: Education: Goal: Knowledge of disease or condition will improve Outcome: Progressing Goal: Knowledge of secondary prevention will improve (MUST DOCUMENT ALL) Outcome: Progressing Goal: Knowledge of patient specific risk factors will improve (DELETE if not current risk factor) Outcome: Progressing   Problem: Ischemic Stroke/TIA Tissue Perfusion: Goal: Complications of ischemic stroke/TIA will be minimized Outcome: Progressing   Problem: Coping: Goal: Will verbalize positive feelings about self Outcome: Progressing Goal: Will identify appropriate support  needs Outcome: Progressing   Problem: Health Behavior/Discharge Planning: Goal: Ability to manage health-related needs will improve Outcome: Progressing Goal: Goals will be collaboratively established with patient/family Outcome: Progressing

## 2023-04-12 DIAGNOSIS — I469 Cardiac arrest, cause unspecified: Secondary | ICD-10-CM | POA: Diagnosis not present

## 2023-04-12 DIAGNOSIS — G9341 Metabolic encephalopathy: Secondary | ICD-10-CM | POA: Diagnosis not present

## 2023-04-12 DIAGNOSIS — J9602 Acute respiratory failure with hypercapnia: Secondary | ICD-10-CM | POA: Diagnosis not present

## 2023-04-12 DIAGNOSIS — Z7189 Other specified counseling: Secondary | ICD-10-CM | POA: Diagnosis not present

## 2023-04-12 DIAGNOSIS — J9601 Acute respiratory failure with hypoxia: Secondary | ICD-10-CM | POA: Diagnosis not present

## 2023-04-12 LAB — COOXEMETRY PANEL
Carboxyhemoglobin: 1.5 % (ref 0.5–1.5)
Methemoglobin: 0.8 % (ref 0.0–1.5)
O2 Saturation: 99.2 %
Total hemoglobin: 9.7 g/dL — ABNORMAL LOW (ref 12.0–16.0)
Total oxygen content: 96.9 %

## 2023-04-12 LAB — BASIC METABOLIC PANEL
Anion gap: 9 (ref 5–15)
BUN: 51 mg/dL — ABNORMAL HIGH (ref 8–23)
CO2: 24 mmol/L (ref 22–32)
Calcium: 8.4 mg/dL — ABNORMAL LOW (ref 8.9–10.3)
Chloride: 112 mmol/L — ABNORMAL HIGH (ref 98–111)
Creatinine, Ser: 1.34 mg/dL — ABNORMAL HIGH (ref 0.61–1.24)
GFR, Estimated: 55 mL/min — ABNORMAL LOW (ref >60)
Glucose, Bld: 106 mg/dL — ABNORMAL HIGH (ref 70–99)
Potassium: 3.2 mmol/L — ABNORMAL LOW (ref 3.5–5.1)
Sodium: 145 mmol/L (ref 135–145)

## 2023-04-12 LAB — BLOOD GAS, ARTERIAL
Acid-base deficit: 1 mmol/L (ref 0.0–2.0)
Bicarbonate: 22.2 mmol/L (ref 20.0–28.0)
FIO2: 30 %
O2 Saturation: 96.8 %
PEEP: 5 cmH2O
Patient temperature: 37
Pressure support: 10 cmH2O
pCO2 arterial: 32 mm[Hg] (ref 32–48)
pH, Arterial: 7.45 (ref 7.35–7.45)
pO2, Arterial: 66 mm[Hg] — ABNORMAL LOW (ref 83–108)

## 2023-04-12 LAB — GLUCOSE, CAPILLARY
Glucose-Capillary: 106 mg/dL — ABNORMAL HIGH (ref 70–99)
Glucose-Capillary: 110 mg/dL — ABNORMAL HIGH (ref 70–99)
Glucose-Capillary: 137 mg/dL — ABNORMAL HIGH (ref 70–99)
Glucose-Capillary: 140 mg/dL — ABNORMAL HIGH (ref 70–99)
Glucose-Capillary: 72 mg/dL (ref 70–99)
Glucose-Capillary: 96 mg/dL (ref 70–99)

## 2023-04-12 LAB — CBC
HCT: 26.2 % — ABNORMAL LOW (ref 39.0–52.0)
Hemoglobin: 8.4 g/dL — ABNORMAL LOW (ref 13.0–17.0)
MCH: 33.5 pg (ref 26.0–34.0)
MCHC: 32.1 g/dL (ref 30.0–36.0)
MCV: 104.4 fL — ABNORMAL HIGH (ref 80.0–100.0)
Platelets: 117 10*3/uL — ABNORMAL LOW (ref 150–400)
RBC: 2.51 MIL/uL — ABNORMAL LOW (ref 4.22–5.81)
RDW: 13.2 % (ref 11.5–15.5)
WBC: 7.2 10*3/uL (ref 4.0–10.5)
nRBC: 0 % (ref 0.0–0.2)

## 2023-04-12 LAB — TRIGLYCERIDES: Triglycerides: 162 mg/dL — ABNORMAL HIGH (ref <150)

## 2023-04-12 LAB — POTASSIUM: Potassium: 3.8 mmol/L (ref 3.5–5.1)

## 2023-04-12 LAB — MAGNESIUM: Magnesium: 2.2 mg/dL (ref 1.7–2.4)

## 2023-04-12 LAB — PHOSPHORUS: Phosphorus: 3.6 mg/dL (ref 2.5–4.6)

## 2023-04-12 MED ORDER — DEXTROSE 50 % IV SOLN
1.0000 | Freq: Once | INTRAVENOUS | Status: AC
  Administered 2023-04-12: 50 mL via INTRAVENOUS
  Filled 2023-04-12: qty 50

## 2023-04-12 MED ORDER — FUROSEMIDE 10 MG/ML IJ SOLN
60.0000 mg | Freq: Once | INTRAMUSCULAR | Status: AC
  Administered 2023-04-12: 60 mg via INTRAVENOUS
  Filled 2023-04-12: qty 6

## 2023-04-12 MED ORDER — METHYLPREDNISOLONE SODIUM SUCC 40 MG IJ SOLR
10.0000 mg | Freq: Four times a day (QID) | INTRAMUSCULAR | Status: AC
  Administered 2023-04-12 – 2023-04-13 (×4): 10 mg via INTRAVENOUS
  Filled 2023-04-12 (×4): qty 1

## 2023-04-12 MED ORDER — POTASSIUM CHLORIDE 20 MEQ PO PACK
40.0000 meq | PACK | ORAL | Status: AC
  Administered 2023-04-12 (×2): 40 meq
  Filled 2023-04-12 (×2): qty 2

## 2023-04-12 NOTE — Progress Notes (Signed)
   04/12/23 1245  Spiritual Encounters  Type of Visit Follow up  Care provided to: Pt and family  Referral source Chaplain assessment  Reason for visit Routine spiritual support  OnCall Visit No  Interventions  Spiritual Care Interventions Made Compassionate presence;Decision-making support/facilitation;Prayer;Encouragement  Intervention Outcomes  Outcomes Awareness around self/spiritual resourses  Spiritual Care Plan  Spiritual Care Issues Still Outstanding No further spiritual care needs at this time (see row info)

## 2023-04-12 NOTE — Progress Notes (Addendum)
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  He presented to [facility] ER via EMS from [facility] following a witnessed cardiac arrest.  Bystander CPR initiated and EMS arrived on the scene within 30 seconds cardiac rhythm asystole.  ROSC initially achieved, however pt in and out of PEA/asystole.    Pt recently evaluated in the ER on 02/4 with urinary urgency and frequency that started 5 days prior to that presentation.  Diagnosed with a UTI and prescribed Bactrim DS twice daily for 10 days.  However, symptoms persisted prompting [facility] visit today.  ED Course  Upon arrival to the ER pt on NRB with pulse.  It was reported during chest compressions the pt began to fight and pulling at medical devices.  While in the the ER waiting room the pt cardiac arrested again requiring ACLS protocol and mechanical intubation.  Pt with varied cardiac rhythm during cardiac arrest PEA/asystole/complete heart block.  He required transcutaneously pacing.  EKG showed apparent sinus rhythm with complete heart block and ventricular escape rhythm with LBBB morphology.  Total downtime estimated 2hrs from out of hospital cardiac arrest and inpatient cardiac arrest.  Pt required epinephrine gtt due to bradycardia and hypotension.  Pt taken emergently to the cardiac cath lab for temporary transvenous pacemaker placement, emergent cardiac catheterization, and swan-ganz catheter placement.   Cardiac cath revealed severe native CAD with patent LIMA-LAD and SVG-OM. SVG-RCA is occluded, but did not appear  acute and unlikely the inciting factor for today's cardiac arrest.  Per cardiology if pt makes a meaningful recovery he may require revascularization of the RCA.  Pt admitted to the ICU post cardiac cath PCCM consulted to asume care.    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  Worsening AKI and mild hyperkalemia, given shifting measures.  Low threshold for Nephrology consultation. 02/13: Weaning down vasopressors, Vasopressin off, Levo @ 7, Epi @ 6.  On minimal vent support.  Renal function and hyperkalemia slowly improving.  Advanced Heart Failure diuresing with 40 mg IV Lasix x1 dose for CVP of 15.  Perform WUA. 02/14: Weaned off vasopressors  On minimal vent support, failed WUA due to        severe hypertension and increased WOB.  On WUA only opening eyes, not        following commands.  MRI Brain revealed 2 small acute infarctions affecting the        cortical brain in theright frontal region.Theone Murdoch, temporary pacer and right        femoral venous sheath removed.  AKI slowly improving.  Heparin gtt discontinued  given low suspicion for PE.  To have PICC placed per Heart Failure team request. 02/15: Pt remains mechanically intubated.  All sedation off brainstem reflexes intact and opens eyes to voice.  Mentation precluding extubation.  Neurology consulted 02/16: Overnight pt developed worsening acute respiratory  failure with accessory muscle use, tachycardia, and hypoxia requiring bagging via BVM, therefore propofol gtt restarted.  Neurology recommending MRA Head/Neck and EEG once stable to transfer.  Performing WUA and possible SBT today  2/17: No significant events overnight, mental status still lagging. stroke work up pending per neuro recs 2/18: No significant events overnight. EEG without seizures, CTH negative, and Neck as below. Neuro following for neuroprognostication  Interim History / Subjective:  As outlined above under Significant Hospital Events section  Micro Data:  2/11: COVID-19 PCR>> negative 2/11: MRSA PCR>> negative 2/11: Urine>>no growth 2/12: Blood cultures x2>>no growth to date  Antimicrobials:   Anti-infectives (From admission, onward)    Start     Dose/Rate Route Frequency Ordered Stop   04/11/23 0700  piperacillin-tazobactam (ZOSYN) IVPB 3.375 g        3.375 g 12.5 mL/hr over 240 Minutes Intravenous  Once 04/11/23 0605 04/11/23 1030   04/06/23 1100  vancomycin (VANCOREADY) IVPB 2000 mg/400 mL        2,000 mg 200 mL/hr over 120 Minutes Intravenous  Once 04/06/23 1006 04/06/23 1253   04/06/23 1100  piperacillin-tazobactam (ZOSYN) IVPB 3.375 g  Status:  Discontinued        3.375 g 12.5 mL/hr over 240 Minutes Intravenous Every 8 hours 04/06/23 1006 04/11/23 0605   04/05/23 1830  cefTRIAXone (ROCEPHIN) 2 g in sodium chloride 0.9 % 100 mL IVPB  Status:  Discontinued        2 g 200 mL/hr over 30 Minutes Intravenous Every 24 hours 04/05/23 1736 04/06/23 0827      Objective   Blood pressure (!) 111/51, pulse 89, temperature 99.2 F (37.3 C), temperature source Axillary, resp. rate (!) 21, height 6' (1.829 m), weight 106.9 kg, SpO2 97%. CVP:  [1 mmHg-21 mmHg] 5 mmHg  Vent Mode: PRVC FiO2 (%):  [50 %] 50 % Set Rate:  [22 bmp] 22 bmp Vt Set:  [500 mL] 500 mL PEEP:  [5 cmH20] 5 cmH20 Pressure Support:  [5 cmH20] 5 cmH20 Plateau Pressure:  [15 cmH20-18 cmH20] 15 cmH20    Intake/Output Summary (Last 24 hours) at 04/12/2023 0519 Last data filed at 04/12/2023 0200 Gross per 24 hour  Intake 2627.63 ml  Output 1600 ml  Net 1027.63 ml   Filed Weights   04/09/23 0324 04/10/23 0400 04/11/23 0500  Weight: 104.2 kg 104.7 kg 106.9 kg   Examination: General: Acute on chronically-ill appearing male, NAD mechanically intubated  HENT: Supple, mild JVD  Lungs: Faint rhonchi throughout, even, non labored, overbreathing the vent Cardiovascular: RRR, no m/r/g, 2+ radial/1+ distal pulses, bilateral extremities cool to touch  Abdomen: +BS x4, obese, soft non distended  Extremities: Normal bulk and tone, no deformities Neuro: Sedated, opens eyes to voice, PERRL, +cough/gag reflexes GU: Indwelling foley catheter draining yellow urine   Resolved Hospital Problem list   Anion gap metabolic acidosis  Lactic acidosis   Assessment & Plan:  #Acute metabolic encephalopathy  #Mechanical intubation pain/discomfort  MR Brain 02/14: 2 small acute infarctions affecting the cortical brain in the right frontal region.  Mild chronic small-vessel ischemic change of the cerebral hemispheric white matter with a few old small cortical infarctions scattered about the right hemisphere suggesting chronic/recurrent  micro embolic infarctions. Recommend carotid evaluation. - Maintain a RASS goal of 0  - Avoid sedation as able, however if sedation required can resume propofol gtt  - Avoid sedating medications as able - Daily wake up assessment - Neurology consulted appreciate input  - Stroke work up per Neuro recs - Continue ASA 81mg  daily + plavix 75mg  daily x21 days f/b ASA 81mg  daily monotherapy after that  - Continue atorvastatin 80mg  daily  - US Carotid - MRA Head/Neck with small infarcts, and EEG without seizures. 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 SVG-RCA is occluded, but did not appear acute and unlikely the inciting factor for today's cardiac arrest Echocardiogram 04/07/23:  LVEF 40-45%, mild LVH, Grade I DD, RV systolic function not well visualized, RV size normal - Continuous telemetry monitoring - Maintain map 65 or higher  - Lactic acid has normalized - Trend HS Troponin until peaked - Trend Coox panel - Diuresis as BP and renal function permits - Cardiology following, appreciate input: recommending if pt has a meaningful recovery may require revascularization of the RCA and if junctional rhythm persist start amiodarone gtt - Heparin gtt discontinued 2/14 - Advanced heart failure team following along - Bidil started for bp control per cardiology for bp control  - Resumed clopidogrel 02/16 and continue atorvastatin  - Thyroid elevated at 18, but free T4 normal at 0.93  #Acute hypoxic hypercapnic respiratory failure  #Possible PE~low suspicion  #Aspiration pneumonia secondary to acute encephalopathy post cardiac arrest  #Mechanical intubation  - Full vent support, implement lung protective strategies - Plateau pressures less than 30 cm H20 - Wean FiO2 & PEEP as tolerated to maintain O2 sats >92% - Follow intermittent Chest X-ray & ABG as needed - Spontaneous Breathing Trials when respiratory parameters met and mental status permits - VAP bundle implemented  - Prn Bronchodilators - Unable to perform CTA PE Chest due to acute kidney injury consider lung VQ scan once stabilized to travel ~ Venous [place] of Bilateral LE's negative for DVT, Echo with no evidence of RV strain - Venous US RUE pending to r/o VTE   #Acute kidney injury secondary to ATN~improving  #Hypokalemia  - Trend BMP  - Strict I&O's  - Ensure adequate renal perfusion - Avoid nephrotoxic agents as able - Replace  electrolytes as indicated ~ Pharmacy following for assistance with electrolyte replacement  #UTI  #Aspiration pneumonia  - Trend WBC and monitor fever curve - Trend PCT  - Follow cultures  - Continue abx as outlined above   #Transaminitis~improving   - Trend hepatic function panel  - Avoid hepatotoxic medications as able   #Hyperglycemia  - Hemoglobin A1c 02/11: 5.9 - CBG's q4hrs  - Very sensitive SSI  - Follow hyper/hypoglycemic protocol  - Target CBG readings 140 to 180   Best Practice (right click and "Reselect all SmartList Selections" daily)   Diet/type: tube feeds DVT prophylaxis SQ Heparin Pressure ulcer(s): N/A GI prophylaxis: H2B Lines: Portacath present on admission and Left Upper Extremity PICC and still needed Foley:  Yes, and it is still needed Code Status:  DNR Last date of multidisciplinary goals of care discussion [04/11/23]  2/17: updated pts sisters when they arrive at bedside regarding pts condition and current plan of care.  Labs   CBC: Recent Labs  Lab 04/05/23 1743 04/06/23 0316 04/08/23 0412 04/09/23 0413 04/10/23 0421 04/11/23 0406 04/12/23 0433  WBC 29.0*   < > 24.0* 15.9* 9.6 10.6* 7.2  NEUTROABS 25.2*  --   --   --   --   --   --   HGB 12.8*   < > 8.5* 8.4* 8.8* 8.8* 8.4*  HCT 37.3*   < > 25.2* 24.5* 25.6* 27.0* 26.2*  MCV 98.4   < > 99.2 102.1* 99.2 103.4* 104.4*  PLT 277   < > 132* 128* 121* 127* 117*   < > = values in this interval not displayed.    Basic Metabolic Panel: Recent Labs  Lab 04/08/23 0412 04/08/23 1800 04/09/23 0413 04/09/23 1453 04/10/23 0418 04/10/23 0421 04/10/23 1502 04/11/23 0406 04/12/23 0433  NA 141 144 142  --  146*  --   --  148* 145  K 3.7 3.6 2.9* 3.6 2.6*  --  3.1* 3.2* 3.2*  CL 106 106 105  --  109  --   --  114* 112*  CO2 26 26 29   --  26  --   --  24 24  GLUCOSE 105* 97 107*  --  117*  --   --  132* 106*  BUN 50* 54* 57*  --  59*  --   --  51* 51*  CREATININE 2.17* 2.03* 2.04*  --  1.72*   --   --  1.50* 1.34*  CALCIUM 8.4* 8.6* 8.3*  --  8.2*  --   --  8.4* 8.4*  MG 2.2  --  2.1  --   --  2.0  --  2.2 2.2  PHOS 3.5  --  4.0  --   --  2.9  --  2.8 3.6   GFR: Estimated Creatinine Clearance: 59.2 mL/min (A) (by C-G formula based on SCr of 1.34 mg/dL (H)). Recent Labs  Lab 04/05/23 1658 04/05/23 1743 04/06/23 0316 04/06/23 1042 04/06/23 1408 04/07/23 0401 04/07/23 0758 04/08/23 0412 04/09/23 0413 04/09/23 1453 04/10/23 0421 04/11/23 0406 04/12/23 0433  PROCALCITON 0.28  --  7.41  --   --   --   --   --   --  1.70  --   --   --   WBC  --    < > 34.5*  --   --    < >  --    < > 15.9*  --  9.6 10.6* 7.2  LATICACIDVEN 2.9*   < > 3.0* 3.7* 2.8*  --  1.5  --   --   --   --   --   --    < > = values in this interval not displayed.    Liver Function Tests: Recent Labs  Lab 04/05/23 1251 04/05/23 1703 04/07/23 0758 04/09/23 1453  AST 83* 273* 83* 37  ALT 134* 300* 146* 64*  ALKPHOS 99 124 80 73  BILITOT 0.8 0.9 0.7 0.9  PROT 6.8 5.9* 5.5* 5.3*  ALBUMIN 3.5 2.9* 2.6* 2.4*   No results for input(s): "LIPASE", "AMYLASE" in the last 168 hours. No results for input(s): "AMMONIA" in the last 168 hours.  ABG    Component Value Date/Time   PHART 7.48 (H) 04/11/2023 0432   PCO2ART 35 04/11/2023 0432   PO2ART 112 (H) 04/11/2023 0432   HCO3 26.1 04/11/2023 0432   TCO2 33 (H) 04/05/2023 1459   ACIDBASEDEF 1.5 04/07/2023 0500  O2SAT 73.9 04/11/2023 0852     Coagulation Profile: Recent Labs  Lab 04/05/23 1703  INR 1.4*    Cardiac Enzymes: No results for input(s): "CKTOTAL", "CKMB", "CKMBINDEX", "TROPONINI" in the last 168 hours.  HbA1C: Hgb A1c MFr Bld  Date/Time Value Ref Range Status  04/05/2023 07:38 PM 5.9 (H) 4.8 - 5.6 % Final    Comment:    (NOTE) Pre diabetes:          5.7%-6.4%  Diabetes:              >6.4%  Glycemic control for   <7.0% adults with diabetes   08/28/2017 02:27 AM 5.6 4.8 - 5.6 % Final    Comment:    (NOTE) Pre diabetes:           5.7%-6.4% Diabetes:              >6.4% Glycemic control for   <7.0% adults with diabetes     CBG: Recent Labs  Lab 04/11/23 1206 04/11/23 1619 04/11/23 1950 04/11/23 2338 04/12/23 0435  GLUCAP 80 115* 86 95 106*    Review of Systems:   Unable to assess pt mechanically intubated   Past Medical History:  He,  has a past medical history of Alternating RBBB & LBBB, Ankle swelling (2024), Arthritis, Bladder tumor, CAD (coronary artery disease), Cancer (HCC), Cancer of base of tongue (HCC), Essential hypertension, 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   Surgical History:   Past Surgical History:  Procedure Laterality Date   CARDIAC CATHETERIZATION     CATARACT EXTRACTION W/PHACO Left 07/18/2018   Procedure: CATARACT EXTRACTION PHACO AND INTRAOCULAR LENS PLACEMENT (IOC)  LEFT;  Surgeon: Nevada Crane, MD;  Location: [facility];  Service: Ophthalmology;  Laterality: Left;   CATARACT EXTRACTION W/PHACO Right 08/14/2018   Procedure: CATARACT EXTRACTION PHACO AND INTRAOCULAR LENS PLACEMENT (IOC)  RIGHT;  Surgeon: Nevada Crane, MD;  Location: [facility];  Service: Ophthalmology;  Laterality: Right;   CORONARY ARTERY BYPASS GRAFT N/A 08/27/2017   Procedure: CORONARY ARTERY BYPASS GRAFTING (CABG) ON PUMP USING LEFT INTERNAL MAMMARY ARTERY AND LEFT GREATER SAPHENOUS VEIN VIA ENDOVEIN HARVEST;  Surgeon: Alleen Borne, MD;  Location: [facility] OR;  Service: Open Heart Surgery;  Laterality: N/A;   CORONARY/GRAFT ACUTE MI REVASCULARIZATION N/A 08/27/2017   Procedure: Coronary/Graft Acute MI Revascularization;  Surgeon: Iran Ouch, MD;  Location: [facility];  Service: Cardiovascular;  Laterality: N/A;   CYSTOSCOPY W/ RETROGRADES Bilateral 12/25/2018   Procedure: CYSTOSCOPY WITH RETROGRADE PYELOGRAM;  Surgeon:  Vanna Scotland, MD;  Location: [facility] OR[facility];  Service: Urology;  Laterality: Bilateral;   FRACTURE SURGERY Right 1958   arm and left wrist compound fracture, no metal   HERNIA REPAIR Right    inguinial   IR IMAGING GUIDED PORT INSERTION  10/15/2022   LEFT HEART CATH AND CORONARY ANGIOGRAPHY N/A 08/27/2017   Procedure: LEFT HEART CATH AND CORONARY ANGIOGRAPHY;  Surgeon: Iran Ouch, MD;  Location: [facility];  Service: Cardiovascular;  Laterality: N/A;   RIGHT/LEFT HEART CATH AND CORONARY/GRAFT ANGIOGRAPHY N/A 04/05/2023   Procedure: RIGHT/LEFT HEART CATH AND CORONARY/GRAFT ANGIOGRAPHY;  Surgeon: Yvonne Kendall, MD;  Location: [facility];  Service: Cardiovascular;  Laterality: N/A;   TEMPORARY PACEMAKER N/A 04/05/2023   Procedure: TEMPORARY PACEMAKER;  Surgeon: Yvonne Kendall, MD;  Location: [facility];  Service: Cardiovascular;  Laterality:  N/A;   TRANSURETHRAL RESECTION OF BLADDER TUMOR WITH MITOMYCIN-C N/A 12/25/2018   Procedure: TRANSURETHRAL RESECTION OF BLADDER TUMOR WITH Gemcitabine;  Surgeon: Vanna Scotland, MD;  Location: [facility] OR[facility];  Service: Urology;  Laterality: N/A;     Social History:   reports that he quit smoking about 25 years ago. His smoking use included cigarettes. His smokeless tobacco use includes snuff. He reports that he does not currently use alcohol. He reports that he does not currently use drugs.   Family History:  His family history includes Cervical cancer in his maternal grandmother; Heart failure in his mother; Lung disease in his father; Stroke in his father.   Allergies No Known Allergies   Home Medications  Prior to Admission medications   Medication Sig Start Date End Date Taking? Authorizing Provider  acetaminophen (TYLENOL) 650 MG CR tablet Take 650 mg by mouth every 8 (eight) hours as needed for pain.    [provider]  aspirin EC 81 MG EC tablet Take 1 tablet (81 mg total) by mouth daily. 09/03/17   Gold, Glenice Laine, PA-C  atorvastatin (LIPITOR) 80 MG tablet Take 1 tablet by mouth once daily 02/08/23   Creig Hines, NP  carvedilol (COREG) 12.5 MG tablet TAKE 1 TABLET BY MOUTH TWICE DAILY WITH A MEAL 03/10/23   End, Cristal Deer, MD  lidocaine-prilocaine (EMLA) cream Apply on the port. 30 -45 min  prior to port access. 10/08/22   Earna Coder, MD  losartan (COZAAR) 100 MG tablet Take 1 tablet by mouth once daily 03/10/23   End, Cristal Deer, MD  magic mouthwash (multi-ingredient) oral suspension Swish and spit 5-10 mLs 4 (four) times daily as needed. 11/24/22   Earna Coder, MD  Multiple Vitamin (MULTIVITAMIN WITH MINERALS) TABS tablet Take 1 tablet by mouth daily. One a Day over 50/ lunch    [provider]  nitroGLYCERIN (NITROSTAT) 0.4 MG SL tablet DISSOLVE ONE TABLET UNDER THE TONGUE EVERY 5 MINUTES AS NEEDED FOR CHEST PAIN.  DO NOT EXCEED A TOTAL OF 3 DOSES IN 15 MINUTES 03/24/23   Creig Hines, NP  OLANZapine (ZYPREXA) 5 MG tablet Take one pill at night. 11/03/22   Earna Coder, MD  ondansetron (ZOFRAN) 8 MG tablet One pill every 8 hours as needed for nausea/vomitting. 10/08/22   Earna Coder, MD  polyethylene glycol (MIRALAX) 17 g packet Take 17 g by mouth daily. 11/07/22   Romeo Apple, Myah A, PA-C  prochlorperazine (COMPAZINE) 10 MG tablet Take 1 tablet (10 mg total) by mouth every 6 (six) hours as needed for nausea or vomiting. 10/08/22   Earna Coder, MD  senna-docusate (SENOKOT-S) 8.6-50 MG tablet Take 1 tablet by mouth daily. 11/07/22   Romeo Apple, Myah A, PA-C  sucralfate (CARAFATE) 1 g tablet Take 1 tablet (1 g total) by mouth 4 (four) times daily -  with meals and at bedtime. Dissolve in 4 tablespoons warm water 30 minutes before meals. 10/26/22   Carmina Miller, MD  sulfamethoxazole-trimethoprim (BACTRIM DS) 800-160 MG tablet Take 1 tablet by mouth 2 (two) times daily for 10 days. 03/29/23 04/08/23  Becky Augusta, NP  torsemide (DEMADEX)  20 MG tablet Take 1 tablet (20 mg total) by mouth daily as needed (swelling and weight gain). 11/25/22 02/23/23  Creig Hines, NP  traMADol HCl 100 MG TABS Take 100 mg by mouth every 8 (eight) hours as needed. 10/26/22   Earna Coder, MD  Scheduled Meds:  acetaminophen  650 mg Per  Tube Q6H   aspirin  81 mg Per Tube Daily   atorvastatin  80 mg Per Tube Daily   Chlorhexidine Gluconate Cloth  6 each Topical QHS   clopidogrel  75 mg Per Tube Daily   docusate  100 mg Per Tube BID   enoxaparin (LOVENOX) injection  50 mg Subcutaneous Q24H   feeding supplement (PROSource TF20)  60 mL Per Tube BID   free water  100 mL Per Tube Q4H   insulin aspart  0-6 Units Subcutaneous Q4H   lidocaine  1 patch Transdermal Q24H   mouth rinse  15 mL Mouth Rinse Q2H   oxyCODONE  10 mg Per Tube Q6H   polyethylene glycol  17 g Per Tube Daily   sodium chloride flush  10-40 mL Intracatheter Q12H   sodium chloride flush  10-40 mL Intracatheter Q12H   Continuous Infusions:  sodium chloride 10 mL/hr at 04/12/23 0200   famotidine (PEPCID) IV Stopped (04/11/23 1844)   feeding supplement (VITAL HIGH PROTEIN) 60 mL/hr at 04/12/23 0200   norepinephrine (LEVOPHED) Adult infusion Stopped (04/11/23 0855)   propofol (DIPRIVAN) infusion 60 mcg/kg/min (04/12/23 0401)   PRN Meds:.sodium chloride, acetaminophen, hydrALAZINE, ipratropium-albuterol, ondansetron (ZOFRAN) IV, mouth rinse, sodium chloride flush, sodium chloride flush   Critical care time: 40 minutes        Webb Silversmith, DNP, CCRN, FNP-C, AGACNP-BC Acute Care & Family Nurse Practitioner  [facility]  See Amion for personal pager PCCM on call pager [phone] until 7 am

## 2023-04-12 NOTE — IPAL (Signed)
  Interdisciplinary Goals of Care Family Meeting   Date carried out: 04/12/2023  Location of the meeting: Bedside  Member's involved: Physician, Bedside Registered Nurse, and Family Member or next of kin    GOALS OF CARE DISCUSSION  The Clinical status was relayed to family in detail- Sisters at bedside  Updated and notified of patients medical condition- Patient remains unresponsive and will not open eyes to command.   Patient with increased WOB and using accessory muscles to breathe Explained to family course of therapy and the modalities  Patient with Progressive multiorgan failure with a very high probablity of a very minimal chance of meaningful recovery despite all aggressive and optimal medical therapy.    PATIENT REMAINS DNR status  Family understands the situation. CT scans show some resolution of cancer Patient given lasix and steroids in preparation for trial of extubation  Family are satisfied with Plan of action and management. All questions answered  Additional CC time 35 mins   Naif Alabi Santiago Glad, M.D.  [facility]  Medical Director [facility] Medical Director [facility] Cardio-Pulmonary Department

## 2023-04-12 NOTE — Plan of Care (Signed)
Problem: Education: Goal: Knowledge of General Education information will improve Description: Including pain rating scale, medication(s)/side effects and non-pharmacologic comfort measures Outcome: Progressing   Problem: Health Behavior/Discharge Planning: Goal: Ability to manage health-related needs will improve Outcome: Progressing   Problem: Clinical Measurements: Goal: Ability to maintain clinical measurements within normal limits will improve Outcome: Progressing Goal: Will remain free from infection Outcome: Progressing Goal: Diagnostic test results will improve Outcome: Progressing Goal: Respiratory complications will improve Outcome: Progressing Goal: Cardiovascular complication will be avoided Outcome: Progressing   Problem: Activity: Goal: Risk for activity intolerance will decrease Outcome: Progressing   Problem: Nutrition: Goal: Adequate nutrition will be maintained Outcome: Progressing   Problem: Coping: Goal: Level of anxiety will decrease Outcome: Progressing   Problem: Elimination: Goal: Will not experience complications related to bowel motility Outcome: Progressing Goal: Will not experience complications related to urinary retention Outcome: Progressing   Problem: Pain Managment: Goal: General experience of comfort will improve and/or be controlled Outcome: Progressing   Problem: Safety: Goal: Ability to remain free from injury will improve Outcome: Progressing   Problem: Skin Integrity: Goal: Risk for impaired skin integrity will decrease Outcome: Progressing   Problem: Education: Goal: Understanding of CV disease, CV risk reduction, and recovery process will improve Outcome: Progressing   Problem: Activity: Goal: Ability to return to baseline activity level will improve Outcome: Progressing   Problem: Cardiovascular: Goal: Ability to achieve and maintain adequate cardiovascular perfusion will improve Outcome: Progressing Goal:  Vascular access site(s) Level 0-1 will be maintained Outcome: Progressing   Problem: Health Behavior/Discharge Planning: Goal: Ability to safely manage health-related needs after discharge will improve Outcome: Progressing   Problem: Education: Goal: Understanding of CV disease, CV risk reduction, and recovery process will improve Outcome: Progressing   Problem: Activity: Goal: Ability to return to baseline activity level will improve Outcome: Progressing   Problem: Cardiovascular: Goal: Ability to achieve and maintain adequate cardiovascular perfusion will improve Outcome: Progressing Goal: Vascular access site(s) Level 0-1 will be maintained Outcome: Progressing   Problem: Health Behavior/Discharge Planning: Goal: Ability to safely manage health-related needs after discharge will improve Outcome: Progressing   Problem: Education: Goal: Ability to describe self-care measures that may prevent or decrease complications (Diabetes Survival Skills Education) will improve Outcome: Progressing   Problem: Coping: Goal: Ability to adjust to condition or change in health will improve Outcome: Progressing   Problem: Fluid Volume: Goal: Ability to maintain a balanced intake and output will improve Outcome: Progressing   Problem: Health Behavior/Discharge Planning: Goal: Ability to identify and utilize available resources and services will improve Outcome: Progressing Goal: Ability to manage health-related needs will improve Outcome: Progressing   Problem: Metabolic: Goal: Ability to maintain appropriate glucose levels will improve Outcome: Progressing   Problem: Nutritional: Goal: Maintenance of adequate nutrition will improve Outcome: Progressing Goal: Progress toward achieving an optimal weight will improve Outcome: Progressing   Problem: Skin Integrity: Goal: Risk for impaired skin integrity will decrease Outcome: Progressing   Problem: Tissue Perfusion: Goal:  Adequacy of tissue perfusion will improve Outcome: Progressing   Problem: Education: Goal: Knowledge of disease or condition will improve Outcome: Progressing Goal: Knowledge of secondary prevention will improve (MUST DOCUMENT ALL) Outcome: Progressing Goal: Knowledge of patient specific risk factors will improve (DELETE if not current risk factor) Outcome: Progressing   Problem: Ischemic Stroke/TIA Tissue Perfusion: Goal: Complications of ischemic stroke/TIA will be minimized Outcome: Progressing   Problem: Coping: Goal: Will verbalize positive feelings about self Outcome: Progressing Goal: Will identify appropriate support  needs Outcome: Progressing

## 2023-04-12 NOTE — Progress Notes (Signed)
Daily Progress Note   Patient Name: Cory Hess       Date: 04/12/2023 DOB: 1945/11/17  Age: 78 y.o. MRN#: [id number] Attending Physician: Erin Fulling, MD Primary Care Physician: Patient, No Pcp Per Admit Date: 04/05/2023  Reason for Consultation/Follow-up: Establishing goals of care  Subjective: Notes and labs reviewed.  In to see patient.  He is currently resting in bed on ventilator support.  No family at bedside.  CCM was able to have a full goals of care conversation with all siblings yesterday and goals are currently in place for: DNR, trach, PEG, and continuing aggressive care.  PMT will sign off as goals are set.  Please reconsult if needs arise.  Length of Stay: 7  Current Medications: Scheduled Meds:   acetaminophen  650 mg Per Tube Q6H   aspirin  81 mg Per Tube Daily   atorvastatin  80 mg Per Tube Daily   Chlorhexidine Gluconate Cloth  6 each Topical QHS   clopidogrel  75 mg Per Tube Daily   docusate  100 mg Per Tube BID   enoxaparin (LOVENOX) injection  50 mg Subcutaneous Q24H   feeding supplement (PROSource TF20)  60 mL Per Tube BID   free water  100 mL Per Tube Q4H   insulin aspart  0-6 Units Subcutaneous Q4H   lidocaine  1 patch Transdermal Q24H   methylPREDNISolone (SOLU-MEDROL) injection  10 mg Intravenous Q6H   mouth rinse  15 mL Mouth Rinse Q2H   oxyCODONE  10 mg Per Tube Q6H   polyethylene glycol  17 g Per Tube Daily   sodium chloride flush  10-40 mL Intracatheter Q12H   sodium chloride flush  10-40 mL Intracatheter Q12H    Continuous Infusions:  sodium chloride 10 mL/hr at 04/12/23 1459   famotidine (PEPCID) IV Stopped (04/11/23 1844)   feeding supplement (VITAL HIGH PROTEIN) 60 mL/hr at 04/12/23 1459   norepinephrine (LEVOPHED) Adult infusion Stopped  (04/11/23 0855)   propofol (DIPRIVAN) infusion 20 mcg/kg/min (04/12/23 1400)    PRN Meds: sodium chloride, acetaminophen, hydrALAZINE, ipratropium-albuterol, ondansetron (ZOFRAN) IV, mouth rinse, sodium chloride flush, sodium chloride flush  Physical Exam Constitutional:      Comments: Eyes closed.  Pulmonary:     Comments: On ventilator            Vital Signs:  BP 132/73   Pulse (!) 115   Temp 97.6 F (36.4 C) (Axillary)   Resp 19   Ht 6' (1.829 m)   Wt 108.3 kg   SpO2 93%   BMI 32.38 kg/m  SpO2: SpO2: 93 % O2 Device: O2 Device: Ventilator O2 Flow Rate:    Intake/output summary:  Intake/Output Summary (Last 24 hours) at 04/12/2023 1515 Last data filed at 04/12/2023 1500 Gross per 24 hour  Intake 2885.34 ml  Output 4100 ml  Net -1214.66 ml   LBM: Last BM Date : 04/12/23 Baseline Weight: Weight: 104.9 kg Most recent weight: Weight: 108.3 kg    Patient Active Problem List   Diagnosis Date Noted   Acute respiratory failure with hypoxia and hypercapnia (HCC) 04/10/2023   Acute CVA (cerebrovascular accident) (HCC) 04/09/2023   Hyperkalemia 04/06/2023   Acute kidney injury (HCC) 04/06/2023   Cardiac arrest (HCC) 04/05/2023   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  PMT will sign off as goals are set.  Please reconsult if needs arise.  Code Status:    Code Status Orders  (From admission, onward)           Start     Ordered    04/11/23 1435  Do not attempt resuscitation (DNR) Pre-Arrest Interventions Desired  (Code Status)  Continuous       Question Answer Comment  If pulseless and not breathing No CPR or chest compressions.   In Pre-Arrest Conditions (Patient Has Pulse and Is Breathing) May intubate, use advanced airway interventions and cardioversion/ACLS medications if appropriate or indicated. May transfer to ICU.   Consent: Discussion documented in EHR or advanced directives reviewed      04/11/23 1435           Code Status History     Date Active Date Inactive Code Status Order ID Comments User Context   04/05/2023 1708 04/11/2023 1435 Full Code [id number]  Yvonne Kendall, MD Inpatient   10/15/2022 1208 10/16/2022 0518 Full Code [id number]  Pernell Dupre, MD HOV   10/15/2022 1205 10/15/2022 1208 Full Code [id number]  Pernell Dupre, MD HOV   11/06/2018 0153 11/07/2018 1515 Full Code [id number]  Mansy, Vernetta Honey, MD ED   08/28/2017 0213 09/02/2017 2231 Full Code [id number]  Ardelle Balls, PA-C Inpatient      Thank you for allowing the Palliative Medicine Team to assist in the care of this patient.  Morton Stall, NP  Please contact Palliative Medicine Team phone at [phone] for questions and concerns.

## 2023-04-12 NOTE — Progress Notes (Addendum)
MD notified: foley catheter has been re-inserted. 575 ml drain from bladder. current blood pressure cuff reading is 156/67 (MAP 91), Art line BP reading is 180/55 (MAP 88). MD will like to continue to monitor the patient's blood pressure for now no further interventions or new orders have been added.

## 2023-04-12 NOTE — Progress Notes (Signed)
PHARMACY CONSULT NOTE - FOLLOW UP  Pharmacy Consult for Electrolyte Monitoring and Replacement   Recent Labs: Potassium (mmol/L)  Date Value  04/12/2023 3.2 (L)   Magnesium (mg/dL)  Date Value  81/19/1478 2.2   Calcium (mg/dL)  Date Value  29/56/2130 8.4 (L)   Albumin (g/dL)  Date Value  86/57/8469 2.4 (L)  04/01/2021 4.2   Phosphorus (mg/dL)  Date Value  62/95/2841 3.6   Sodium (mmol/L)  Date Value  04/12/2023 145  10/05/2022 141   Assessment: DC is a 78 yo male who presented to [facility] ED post cardiac arrest. Their downtime was 2 hours. Pharmacy has been consult to manage this patient's electrolytes while in the ICU.   Fluids: NA Nutrition: Vital high protein 89mL/hr + free water 100 mL Q4H Pertinent Medications: NA  Goal of Therapy:  Electrolytes WNL Today, 04/12/2023 K = 3.2 Mg = 2.2 Phos = 3.6  Plan:  Give 40 mEq KCl packet per tube x 2 No other replacement indicated at this time Continue to monitor with AM labs   Effie Shy, PharmD Pharmacy Resident  04/12/2023 6:45 AM

## 2023-04-13 DIAGNOSIS — I469 Cardiac arrest, cause unspecified: Secondary | ICD-10-CM | POA: Diagnosis not present

## 2023-04-13 DIAGNOSIS — G9341 Metabolic encephalopathy: Secondary | ICD-10-CM | POA: Diagnosis not present

## 2023-04-13 DIAGNOSIS — J9601 Acute respiratory failure with hypoxia: Secondary | ICD-10-CM | POA: Diagnosis not present

## 2023-04-13 DIAGNOSIS — J9602 Acute respiratory failure with hypercapnia: Secondary | ICD-10-CM | POA: Diagnosis not present

## 2023-04-13 LAB — GLUCOSE, CAPILLARY
Glucose-Capillary: 116 mg/dL — ABNORMAL HIGH (ref 70–99)
Glucose-Capillary: 100 mg/dL — ABNORMAL HIGH (ref 70–99)
Glucose-Capillary: 97 mg/dL (ref 70–99)
Glucose-Capillary: 85 mg/dL (ref 70–99)
Glucose-Capillary: 87 mg/dL (ref 70–99)
Glucose-Capillary: 80 mg/dL (ref 70–99)

## 2023-04-13 LAB — RENAL FUNCTION PANEL
Albumin: 2.6 g/dL — ABNORMAL LOW (ref 3.5–5.0)
Anion gap: 9 (ref 5–15)
BUN: 60 mg/dL — ABNORMAL HIGH (ref 8–23)
CO2: 23 mmol/L (ref 22–32)
Calcium: 8.7 mg/dL — ABNORMAL LOW (ref 8.9–10.3)
Chloride: 114 mmol/L — ABNORMAL HIGH (ref 98–111)
Creatinine, Ser: 1.42 mg/dL — ABNORMAL HIGH (ref 0.61–1.24)
GFR, Estimated: 51 mL/min — ABNORMAL LOW (ref >60)
Glucose, Bld: 131 mg/dL — ABNORMAL HIGH (ref 70–99)
Phosphorus: 3.5 mg/dL (ref 2.5–4.6)
Potassium: 3.9 mmol/L (ref 3.5–5.1)
Sodium: 146 mmol/L — ABNORMAL HIGH (ref 135–145)

## 2023-04-13 LAB — MAGNESIUM: Magnesium: 2.2 mg/dL (ref 1.7–2.4)

## 2023-04-13 LAB — CBC
HCT: 28.4 % — ABNORMAL LOW (ref 39.0–52.0)
Hemoglobin: 9.3 g/dL — ABNORMAL LOW (ref 13.0–17.0)
MCH: 33.6 pg (ref 26.0–34.0)
MCHC: 32.7 g/dL (ref 30.0–36.0)
MCV: 102.5 fL — ABNORMAL HIGH (ref 80.0–100.0)
Platelets: 154 10*3/uL (ref 150–400)
RBC: 2.77 MIL/uL — ABNORMAL LOW (ref 4.22–5.81)
RDW: 13 % (ref 11.5–15.5)
WBC: 6.6 10*3/uL (ref 4.0–10.5)
nRBC: 0 % (ref 0.0–0.2)

## 2023-04-13 LAB — COOXEMETRY PANEL
Carboxyhemoglobin: 1.8 % — ABNORMAL HIGH (ref 0.5–1.5)
Methemoglobin: 1.4 % (ref 0.0–1.5)
O2 Saturation: 78 %
Total hemoglobin: 9.8 g/dL — ABNORMAL LOW (ref 12.0–16.0)
Total oxygen content: 75.6 %

## 2023-04-13 MED ORDER — ORAL CARE MOUTH RINSE: 15.0000 mL | OROMUCOSAL | Status: DC | PRN

## 2023-04-13 MED ORDER — ORAL CARE MOUTH RINSE
15.0000 mL | OROMUCOSAL | Status: DC
  Administered 2023-04-13 – 2023-04-15 (×7): 15 mL via OROMUCOSAL

## 2023-04-13 MED ORDER — FENTANYL CITRATE PF 50 MCG/ML IJ SOSY
25.0000 ug | PREFILLED_SYRINGE | Freq: Four times a day (QID) | INTRAMUSCULAR | Status: DC | PRN
  Administered 2023-04-13 – 2023-04-14 (×3): 25 ug via INTRAVENOUS
  Filled 2023-04-13 (×3): qty 1

## 2023-04-13 MED ORDER — POTASSIUM CHLORIDE 20 MEQ PO PACK: 20.0000 meq | PACK | Freq: Once | ORAL | Status: DC

## 2023-04-13 NOTE — Progress Notes (Signed)
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  He presented to [facility] ER via EMS from [facility] following a witnessed cardiac arrest.  Bystander CPR initiated and EMS arrived on the scene within 30 seconds cardiac rhythm asystole.  ROSC initially achieved, however pt in and out of PEA/asystole.    Pt recently evaluated in the ER on 02/4 with urinary urgency and frequency that started 5 days prior to that presentation.  Diagnosed with a UTI and prescribed Bactrim DS twice daily for 10 days.  However, symptoms persisted prompting [facility] visit today.  ED Course  Upon arrival to the ER pt on NRB with pulse.  It was reported during chest compressions the pt began to fight and pulling at medical devices.  While in the the ER waiting room the pt cardiac arrested again requiring ACLS protocol and mechanical intubation.  Pt with varied cardiac rhythm during cardiac arrest PEA/asystole/complete heart block.  He required transcutaneously pacing.  EKG showed apparent sinus rhythm with complete heart block and ventricular escape rhythm with LBBB morphology.  Total downtime estimated 2hrs from out of hospital cardiac arrest and inpatient cardiac arrest.  Pt required epinephrine gtt due to bradycardia and hypotension.  Pt taken emergently to the cardiac cath lab for temporary transvenous pacemaker placement, emergent cardiac catheterization, and swan-ganz catheter placement.   Cardiac cath revealed severe native CAD with patent LIMA-LAD and SVG-OM. SVG-RCA is occluded, but did not appear  acute and unlikely the inciting factor for today's cardiac arrest.  Per cardiology if pt makes a meaningful recovery he may require revascularization of the RCA.  Pt admitted to the ICU post cardiac cath PCCM consulted to asume care.    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  Worsening AKI and mild hyperkalemia, given shifting measures.  Low threshold for Nephrology consultation. 02/13: Weaning down vasopressors, Vasopressin off, Levo @ 7, Epi @ 6.  On minimal vent support.  Renal function and hyperkalemia slowly improving.  Advanced Heart Failure diuresing with 40 mg IV Lasix x1 dose for CVP of 15.  Perform WUA. 02/14: Weaned off vasopressors  On minimal vent support, failed WUA due to        severe hypertension and increased WOB.  On WUA only opening eyes, not        following commands.  MRI Brain revealed 2 small acute infarctions affecting the        cortical brain in theright frontal region.Theone Murdoch, temporary pacer and right        femoral venous sheath removed.  AKI slowly improving.  Heparin gtt discontinued  given low suspicion for PE.  To have PICC placed per Heart Failure team request. 02/15: Pt remains mechanically intubated.  All sedation off brainstem reflexes intact and opens eyes to voice.  Mentation precluding extubation.  Neurology consulted 02/16: Overnight pt developed worsening acute respiratory  failure with accessory muscle use, tachycardia, and hypoxia requiring bagging via BVM, therefore propofol gtt restarted.  Neurology recommending MRA Head/Neck and EEG once stable to transfer.  Performing WUA and possible SBT today  2/17: No significant events overnight, mental status still lagging. stroke work up pending per neuro recs 2/18: No significant events overnight. EEG without seizures, CTH negative, and Neck as below. Neuro following for neuroprognostication 2/19 failed SAT/SBT, followed commands  Interim History / Subjective:  As outlined above under Significant Hospital Events section  Micro Data:  2/11: COVID-19 PCR>> negative 2/11: MRSA PCR>> negative 2/11: Urine>>no growth 2/12: Blood cultures x2>>no growth to date  Antimicrobials:   Anti-infectives (From admission, onward)    Start     Dose/Rate Route Frequency Ordered Stop   04/11/23 0700  piperacillin-tazobactam (ZOSYN) IVPB 3.375 g        3.375 g 12.5 mL/hr over 240 Minutes Intravenous  Once 04/11/23 0605 04/11/23 1030   04/06/23 1100  vancomycin (VANCOREADY) IVPB 2000 mg/400 mL        2,000 mg 200 mL/hr over 120 Minutes Intravenous  Once 04/06/23 1006 04/06/23 1253   04/06/23 1100  piperacillin-tazobactam (ZOSYN) IVPB 3.375 g  Status:  Discontinued        3.375 g 12.5 mL/hr over 240 Minutes Intravenous Every 8 hours 04/06/23 1006 04/11/23 0605   04/05/23 1830  cefTRIAXone (ROCEPHIN) 2 g in sodium chloride 0.9 % 100 mL IVPB  Status:  Discontinued        2 g 200 mL/hr over 30 Minutes Intravenous Every 24 hours 04/05/23 1736 04/06/23 0827       EVENTS OVERNIGHT Remains critically ill Remains intubated Plan for SAT/SBT when family arrives       Objective   Blood pressure (!) 147/67, pulse 88, temperature 99 F (37.2 C), temperature source Oral, resp. rate 20, height 6' (1.829 m), weight 104.3 kg, SpO2 100%. CVP:  [0 mmHg-21 mmHg] 7 mmHg  Vent Mode: PRVC FiO2 (%):  [30 %-50 %] 50 % Set Rate:  [22  bmp-25 bmp] 22 bmp Vt Set:  [500 mL] 500 mL PEEP:  [5 cmH20] 5 cmH20 Pressure Support:  [5 cmH20-10 cmH20] 10 cmH20 Plateau Pressure:  [15 cmH20-17 cmH20] 16 cmH20   Intake/Output Summary (Last 24 hours) at 04/13/2023 0724 Last data filed at 04/13/2023 0600 Gross per 24 hour  Intake 2120 ml  Output 4255 ml  Net -2135 ml   Filed Weights   04/11/23 0500 04/12/23 0500 04/13/23 0500  Weight: 106.9 kg 108.3 kg 104.3 kg   Examination:  REVIEW OF SYSTEMS  PATIENT IS UNABLE TO PROVIDE COMPLETE REVIEW OF SYSTEMS DUE TO SEVERE CRITICAL ILLNESS   PHYSICAL EXAMINATION:  GENERAL:critically ill appearing, +resp distress EYES: Pupils equal, round, reactive to light.  No scleral icterus.  MOUTH: Moist mucosal membrane. INTUBATED NECK: Supple.  PULMONARY: Lungs clear to auscultation, +rhonchi, +wheezing CARDIOVASCULAR: S1 and S2.  Regular rate and rhythm GASTROINTESTINAL: Soft, nontender, -distended. Positive bowel sounds.  MUSCULOSKELETAL: No swelling, clubbing, or edema.  NEUROLOGIC: obtunded,sedated SKIN:normal, warm to touch, Capillary refill delayed  Pulses present bilaterally  Resolved Hospital Problem list   Anion gap metabolic acidosis  Lactic acidosis   Assessment & Plan:  78  yo white male admitted for acute Cardiac arrest CAD with Complete heart block s/p temporary transvenous pacemaker placement (removed 02/15) #Junctional tachycardia  With  Acute on chronic HFrEF with Cardiogenic shock leading to acute severe hypoxic resp failure   Severe ACUTE Hypoxic and Hypercapnic Respiratory Failure -continue Mechanical Ventilator support -Wean Fio2 and PEEP as tolerated -VAP/VENT bundle implementation - Wean PEEP & FiO2 as tolerated, maintain SpO2 > 88% - Head of bed elevated 30 degrees, VAP protocol in place - Plateau pressures less than 30 cm H20  - Intermittent chest x-ray & ABG PRN - Ensure adequate pulmonary hygiene  -will perform SAT/SBT when respiratory parameters are  met   NEUROLOGY ACUTE METABOLIC ENCEPHALOPATHY SAT/SBT this AM Followed simple commands yesterday  Neurology consulted appreciate input  - Continue ASA 81mg  daily + plavix 75mg  daily x21 days f/b ASA 81mg  daily monotherapy after that  - Continue atorvastatin 80mg  daily  - US Carotid - MRA Head/Neck with small infarcts, and EEG without seizures. Repeat CTH no new acute intracranial abnormality   ACUTE SYSTOLIC CARDIAC FAILURE-  Cardiac Cath 02/11: severe native CAD with patent LIMA-LAD and SVG-OM. SVG-RCA is occluded, but did not appear acute and unlikely the inciting factor for today's cardiac arrest Echocardiogram 04/07/23:  LVEF 40-45%, mild LVH, Grade I DD, RV systolic function not well visualized, RV size normal - Continuous telemetry monitoring - Maintain map 65 or higher  - Diuresis as BP and renal function permits LASIX given 2/18 1400 cc ouput - Cardiology following, appreciate input: recommending if pt has a meaningful recovery may require revascularization of the RCA and if junctional rhythm persist start amiodarone gtt - Heparin gtt discontinued 2/14 - Advanced heart failure team following along - Bidil started for bp control per cardiology for bp control  - Resumed clopidogrel 02/16 and continue atorvastatin   ACUTE KIDNEY INJURY/Renal Failure -continue Foley Catheter-assess need -Avoid nephrotoxic agents -Follow urine output, BMP -Ensure adequate renal perfusion, optimize oxygenation -Renal dose medications   Intake/Output Summary (Last 24 hours) at 04/13/2023 0729 Last data filed at 04/13/2023 0600 Gross per 24 hour  Intake 2120 ml  Output 4255 ml  Net -2135 ml      Latest Ref Rng & Units 04/13/2023    3:33 AM 04/12/2023    8:08 PM 04/12/2023    4:33 AM  BMP  Glucose 70 - 99 mg/dL 981   191   BUN 8 - 23 mg/dL 60   51   Creatinine 4.78 - 1.24 mg/dL 2.95   6.21   Sodium 308 - 145 mmol/L 146   145   Potassium 3.5 - 5.1 mmol/L 3.9  3.8  3.2   Chloride 98 - 111  mmol/L 114   112   CO2 22 - 32 mmol/L 23   24   Calcium 8.9 - 10.3 mg/dL 8.7   8.4    INFECTIOUS DISEASE UTI, aspiration pneumonia -continue antibiotics as prescribed -follow up cultures    ENDO - ICU hypoglycemic\Hyperglycemia protocol -check FSBS per protocol   GI GI PROPHYLAXIS as indicated NUTRITIONAL STATUS DIET-->TF's as tolerated Constipation protocol as indicated   ELECTROLYTES -follow labs as needed -replace as needed -pharmacy consultation and following  RESTRICTIVE TRANSFUSION PROTOCOL TRANSFUSION  IF HGB<7  or ACTIVE BLEEDING OR DX of ACUTE CORONARY SYNDROMES     Best Practice (right click and "Reselect all SmartList Selections" daily)   Diet/type: tube feeds DVT prophylaxis SQ Heparin Pressure ulcer(s): N/A GI prophylaxis: H2B Lines: Portacath present on admission  and Left Upper Extremity PICC and still needed Foley:  Yes, and it is still needed Code Status:  DNR Last date of multidisciplinary goals of care discussion [04/12/23]  2/17 and 2/18 : updated pts sisters when they arrive at bedside regarding pts condition and current plan of care.  Labs   CBC: Recent Labs  Lab 04/09/23 0413 04/10/23 0421 04/11/23 0406 04/12/23 0433 04/13/23 0333  WBC 15.9* 9.6 10.6* 7.2 6.6  HGB 8.4* 8.8* 8.8* 8.4* 9.3*  HCT 24.5* 25.6* 27.0* 26.2* 28.4*  MCV 102.1* 99.2 103.4* 104.4* 102.5*  PLT 128* 121* 127* 117* 154    Basic Metabolic Panel: Recent Labs  Lab 04/09/23 0413 04/09/23 1453 04/10/23 0418 04/10/23 0421 04/10/23 1502 04/11/23 0406 04/12/23 0433 04/12/23 2008 04/13/23 0333  NA 142  --  146*  --   --  148* 145  --  146*  K 2.9*   < > 2.6*  --  3.1* 3.2* 3.2* 3.8 3.9  CL 105  --  109  --   --  114* 112*  --  114*  CO2 29  --  26  --   --  24 24  --  23  GLUCOSE 107*  --  117*  --   --  132* 106*  --  131*  BUN 57*  --  59*  --   --  51* 51*  --  60*  CREATININE 2.04*  --  1.72*  --   --  1.50* 1.34*  --  1.42*  CALCIUM 8.3*  --  8.2*   --   --  8.4* 8.4*  --  8.7*  MG 2.1  --   --  2.0  --  2.2 2.2  --  2.2  PHOS 4.0  --   --  2.9  --  2.8 3.6  --  3.5   < > = values in this interval not displayed.   GFR: Estimated Creatinine Clearance: 55.3 mL/min (A) (by C-G formula based on SCr of 1.42 mg/dL (H)). Recent Labs  Lab 04/06/23 1042 04/06/23 1408 04/07/23 0401 04/07/23 0758 04/08/23 0412 04/09/23 1453 04/10/23 0421 04/11/23 0406 04/12/23 0433 04/13/23 0333  PROCALCITON  --   --   --   --   --  1.70  --   --   --   --   WBC  --   --    < >  --    < >  --  9.6 10.6* 7.2 6.6  LATICACIDVEN 3.7* 2.8*  --  1.5  --   --   --   --   --   --    < > = values in this interval not displayed.    Liver Function Tests: Recent Labs  Lab 04/07/23 0758 04/09/23 1453 04/13/23 0333  AST 83* 37  --   ALT 146* 64*  --   ALKPHOS 80 73  --   BILITOT 0.7 0.9  --   PROT 5.5* 5.3*  --   ALBUMIN 2.6* 2.4* 2.6*   No results for input(s): "LIPASE", "AMYLASE" in the last 168 hours. No results for input(s): "AMMONIA" in the last 168 hours.  ABG    Component Value Date/Time   PHART 7.45 04/12/2023 1156   PCO2ART 32 04/12/2023 1156   PO2ART 66 (L) 04/12/2023 1156   HCO3 22.2 04/12/2023 1156   TCO2 33 (H) 04/05/2023 1459   ACIDBASEDEF 1.0 04/12/2023 1156   O2SAT 78 04/13/2023 0603  Coagulation Profile: No results for input(s): "INR", "PROTIME" in the last 168 hours.   Cardiac Enzymes: No results for input(s): "CKTOTAL", "CKMB", "CKMBINDEX", "TROPONINI" in the last 168 hours.  HbA1C: Hgb A1c MFr Bld  Date/Time Value Ref Range Status  04/05/2023 07:38 PM 5.9 (H) 4.8 - 5.6 % Final    Comment:    (NOTE) Pre diabetes:          5.7%-6.4%  Diabetes:              >6.4%  Glycemic control for   <7.0% adults with diabetes   08/28/2017 02:27 AM 5.6 4.8 - 5.6 % Final    Comment:    (NOTE) Pre diabetes:          5.7%-6.4% Diabetes:              >6.4% Glycemic control for   <7.0% adults with diabetes      CBG: Recent Labs  Lab 04/12/23 1107 04/12/23 1601 04/12/23 2006 04/12/23 2337 04/13/23 0340  GLUCAP 96 137* 140* 110* 116*    Review of Systems:   Unable to assess pt mechanically intubated   Past Medical History:  He,  has a past medical history of Alternating RBBB & LBBB, Ankle swelling (2024), Arthritis, Bladder tumor, CAD (coronary artery disease), Cancer (HCC), Cancer of base of tongue (HCC), Essential hypertension, 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   Surgical History:   Past Surgical History:  Procedure Laterality Date   CARDIAC CATHETERIZATION     CATARACT EXTRACTION W/PHACO Left 07/18/2018   Procedure: CATARACT EXTRACTION PHACO AND INTRAOCULAR LENS PLACEMENT (IOC)  LEFT;  Surgeon: Nevada Crane, MD;  Location[facility];  Service: Ophthalmology;  Laterality: Left;   CATARACT EXTRACTION W/PHACO Right 08/14/2018   Procedure: CATARACT EXTRACTION PHACO AND INTRAOCULAR LENS PLACEMENT (IOC)  RIGHT;  Surgeon: Nevada Crane, MD;  Location: [facility];  Service: Ophthalmology;  Laterality: Right;   CORONARY ARTERY BYPASS GRAFT N/A 08/27/2017   Procedure: CORONARY ARTERY BYPASS GRAFTING (CABG) ON PUMP USING LEFT INTERNAL MAMMARY ARTERY AND LEFT GREATER SAPHENOUS VEIN VIA ENDOVEIN HARVEST;  Surgeon: Alleen Borne, MD;  Location: [facility] OR;  Service: Open Heart Surgery;  Laterality: N/A;   CORONARY/GRAFT ACUTE MI REVASCULARIZATION N/A 08/27/2017   Procedure: Coronary/Graft Acute MI Revascularization;  Surgeon: Iran Ouch, MD;  Location: [facility] IN[facility];  Service: Cardiovascular;  Laterality: N/A;   CYSTOSCOPY W/ RETROGRADES Bilateral 12/25/2018   Procedure: CYSTOSCOPY WITH RETROGRADE PYELOGRAM;  Surgeon: Vanna Scotland, MD;  Location: [facility] ORS;  Service: Urology;  Laterality: Bilateral;   FRACTURE SURGERY  Right 1958   arm and left wrist compound fracture, no metal   HERNIA REPAIR Right    inguinial   IR IMAGING GUIDED PORT INSERTION  10/15/2022   LEFT HEART CATH AND CORONARY ANGIOGRAPHY N/A 08/27/2017   Procedure: LEFT HEART CATH AND CORONARY ANGIOGRAPHY;  Surgeon: Iran Ouch, MD;  Location: [facility];  Service: Cardiovascular;  Laterality: N/A;   RIGHT/LEFT HEART CATH AND CORONARY/GRAFT ANGIOGRAPHY N/A 04/05/2023   Procedure: RIGHT/LEFT HEART CATH AND CORONARY/GRAFT ANGIOGRAPHY;  Surgeon: Yvonne Kendall, MD;  Location: [facility];  Service: Cardiovascular;  Laterality: N/A;   TEMPORARY PACEMAKER N/A 04/05/2023   Procedure: TEMPORARY PACEMAKER;  Surgeon: Yvonne Kendall, MD;  Location: [facility];  Service: Cardiovascular;  Laterality: N/A;   TRANSURETHRAL RESECTION OF BLADDER  TUMOR WITH MITOMYCIN-C N/A 12/25/2018   Procedure: TRANSURETHRAL RESECTION OF BLADDER TUMOR WITH Gemcitabine;  Surgeon: Vanna Scotland, MD;  Location: [facility] OR[facility];  Service: Urology;  Laterality: N/A;      DVT/GI PRX  assessed I Assessed the need for Labs I Assessed the need for Foley I Assessed the need for Central Venous Line Family Discussion when available I Assessed the need for Mobilization I made an Assessment of medications to be adjusted accordingly Safety Risk assessment completed  CASE DISCUSSED IN MULTIDISCIPLINARY ROUNDS WITH ICU TEAM     Critical Care Time devoted to patient care services described in this note is 55 minutes.  Critical care was necessary to treat /prevent imminent and life-threatening deterioration. Overall, patient is critically ill, prognosis is guarded.  Patient with Multiorgan failure and at high risk for cardiac arrest and death.    Lucie Leather, M.D.  [facility]  Medical Director [facility] Medical Director [facility] Cardio-Pulmonary Department

## 2023-04-13 NOTE — Procedures (Signed)
Extubation Procedure Note  Patient Details:   Name: Cory Hess DOB: 26-Sep-1945 MRN: [id number]   Airway Documentation:    Vent end date: 04/13/23 Vent end time: 1000   Evaluation  O2 sats: stable throughout Complications: No apparent complications Patient did tolerate procedure well. Bilateral Breath Sounds: Clear, Diminished   Yes able to cough.  Placed on [facility] per MD order.  Ronda Fairly Virgel Haro 04/13/2023, 10:05 AM

## 2023-04-13 NOTE — Progress Notes (Signed)
Nutrition Follow Up Note   DOCUMENTATION CODES:   Obesity unspecified  INTERVENTION:   Recommend NGT placement and nutrition support if patient is unable to be placed on an oral diet.   RD will add supplements with diet advancement   MVI po daily with diet advancement   NUTRITION DIAGNOSIS:   Inadequate oral intake related to inability to eat (pt sedated and ventilated) as evidenced by NPO status. 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  Pt extubated this morning. RD will add supplements with diet advancement. Per chart, pt is down ~2lbs since admission. Pt +278ml on his I & Os. Pt with mild hypernatremia. Recommend NGT placement and nutrition support if patient is unable to be placed on an oral diet.   Medications reviewed and include: aspirin, plavix, colace, lovenox, insulin, oxycodone, miralax, pepcid  Labs reviewed: Na 146(H), K 3.9 wnl, BUN 60(H), creat 1.42(H), P 3.5 wnl, Mg 2.2 wnl Hgb 9.3(L), Hct 28.4(L) Cbgs- 97, 100, 116 x 24 hrs   UOP-   Diet Order:    Diet Order             Diet NPO time specified  Diet effective now                  EDUCATION NEEDS:   No education needs have been identified at this time  Skin:  Skin Assessment: Reviewed RN Assessment (ecchymosis)  Last BM:  2/19- type 7  Height:   Ht Readings from Last 1 Encounters:  04/05/23 6' (1.829 m)    Weight:   Wt Readings from Last 1 Encounters:  04/13/23 104.3 kg     Ideal Body Weight:  80.9 kg  BMI:  Body mass index is 31.19 kg/m.  Estimated Nutritional Needs:   Kcal:  2200-2500kcal/day  Protein:  110-125g/day  Fluid:  2.1-2.4L/day  Betsey Holiday MS, RD, LDN If unable to be reached, please send secure chat to "RD inpatient" available from 8:00a-4:00p daily

## 2023-04-13 NOTE — Progress Notes (Addendum)
PHARMACY CONSULT NOTE - FOLLOW UP  Pharmacy Consult for Electrolyte Monitoring and Replacement   Recent Labs: Potassium (mmol/L)  Date Value  04/13/2023 3.9   Magnesium (mg/dL)  Date Value  16/11/9602 2.2   Calcium (mg/dL)  Date Value  54/10/8117 8.7 (L)   Albumin (g/dL)  Date Value  14/78/2956 2.6 (L)  04/01/2021 4.2   Phosphorus (mg/dL)  Date Value  21/30/8657 3.5   Sodium (mmol/L)  Date Value  04/13/2023 146 (H)  10/05/2022 141   Assessment: DC is a 78 yo male who presented to [facility] ED post cardiac arrest. Their downtime was 2 hours. Pharmacy has been consult to manage this patient's electrolytes while in the ICU.   Fluids: NA Nutrition: Vital high protein 61mL/hr + free water 100 mL Q4H Pertinent Medications: NA (received 1x dose of furosemide 80 mg 2/18)  Goal of Therapy:  Electrolytes WNL (K > 4 and Mg > 2 due to cardiac arrest) Today, 04/13/2023 K = 3.9 Mg = 2.2 Phos = 3.5  Plan:  Replace with 20 mEq of Kcl x 1 per tube (due to post cardiac arrest and 1x dose furosemide) No other replacement indicated at this time Continue to monitor with AM labs   Effie Shy, PharmD Pharmacy Resident  04/13/2023 6:20 AM

## 2023-04-13 NOTE — Plan of Care (Signed)
Problem: Education: Goal: Knowledge of General Education information will improve Description: Including pain rating scale, medication(s)/side effects and non-pharmacologic comfort measures Outcome: Progressing   Problem: Health Behavior/Discharge Planning: Goal: Ability to manage health-related needs will improve Outcome: Progressing   Problem: Clinical Measurements: Goal: Ability to maintain clinical measurements within normal limits will improve Outcome: Progressing Goal: Will remain free from infection Outcome: Progressing Goal: Diagnostic test results will improve Outcome: Progressing Goal: Respiratory complications will improve Outcome: Progressing Goal: Cardiovascular complication will be avoided Outcome: Progressing   Problem: Activity: Goal: Risk for activity intolerance will decrease Outcome: Progressing   Problem: Nutrition: Goal: Adequate nutrition will be maintained Outcome: Progressing   Problem: Coping: Goal: Level of anxiety will decrease Outcome: Progressing   Problem: Elimination: Goal: Will not experience complications related to bowel motility Outcome: Progressing Goal: Will not experience complications related to urinary retention Outcome: Progressing   Problem: Pain Managment: Goal: General experience of comfort will improve and/or be controlled Outcome: Progressing   Problem: Safety: Goal: Ability to remain free from injury will improve Outcome: Progressing   Problem: Skin Integrity: Goal: Risk for impaired skin integrity will decrease Outcome: Progressing   Problem: Education: Goal: Understanding of CV disease, CV risk reduction, and recovery process will improve Outcome: Progressing   Problem: Activity: Goal: Ability to return to baseline activity level will improve Outcome: Progressing   Problem: Cardiovascular: Goal: Ability to achieve and maintain adequate cardiovascular perfusion will improve Outcome: Progressing Goal:  Vascular access site(s) Level 0-1 will be maintained Outcome: Progressing   Problem: Health Behavior/Discharge Planning: Goal: Ability to safely manage health-related needs after discharge will improve Outcome: Progressing   Problem: Education: Goal: Ability to describe self-care measures that may prevent or decrease complications (Diabetes Survival Skills Education) will improve Outcome: Progressing   Problem: Coping: Goal: Ability to adjust to condition or change in health will improve Outcome: Progressing   Problem: Fluid Volume: Goal: Ability to maintain a balanced intake and output will improve Outcome: Progressing   Problem: Health Behavior/Discharge Planning: Goal: Ability to identify and utilize available resources and services will improve Outcome: Progressing Goal: Ability to manage health-related needs will improve Outcome: Progressing   Problem: Metabolic: Goal: Ability to maintain appropriate glucose levels will improve Outcome: Progressing   Problem: Nutritional: Goal: Maintenance of adequate nutrition will improve Outcome: Progressing Goal: Progress toward achieving an optimal weight will improve Outcome: Progressing   Problem: Skin Integrity: Goal: Risk for impaired skin integrity will decrease Outcome: Progressing   Problem: Tissue Perfusion: Goal: Adequacy of tissue perfusion will improve Outcome: Progressing   Problem: Education: Goal: Understanding of CV disease, CV risk reduction, and recovery process will improve Outcome: Progressing   Problem: Activity: Goal: Ability to return to baseline activity level will improve Outcome: Progressing   Problem: Cardiovascular: Goal: Ability to achieve and maintain adequate cardiovascular perfusion will improve Outcome: Progressing Goal: Vascular access site(s) Level 0-1 will be maintained Outcome: Progressing   Problem: Health Behavior/Discharge Planning: Goal: Ability to safely manage health-related  needs after discharge will improve Outcome: Progressing   Problem: Education: Goal: Knowledge of disease or condition will improve Outcome: Progressing Goal: Knowledge of secondary prevention will improve (MUST DOCUMENT ALL) Outcome: Progressing Goal: Knowledge of patient specific risk factors will improve (DELETE if not current risk factor) Outcome: Progressing   Problem: Ischemic Stroke/TIA Tissue Perfusion: Goal: Complications of ischemic stroke/TIA will be minimized Outcome: Progressing   Problem: Coping: Goal: Will verbalize positive feelings about self Outcome: Progressing Goal: Will identify appropriate support  needs Outcome: Progressing   Problem: Health Behavior/Discharge Planning: Goal: Ability to manage health-related needs will improve Outcome: Progressing Goal: Goals will be collaboratively established with patient/family Outcome: Progressing

## 2023-04-13 NOTE — Plan of Care (Signed)
°  Problem: Clinical Measurements: °Goal: Ability to maintain clinical measurements within normal limits will improve °Outcome: Progressing °Goal: Will remain free from infection °Outcome: Progressing °Goal: Diagnostic test results will improve °Outcome: Progressing °Goal: Respiratory complications will improve °Outcome: Progressing °Goal: Cardiovascular complication will be avoided °Outcome: Progressing °  °Problem: Coping: °Goal: Level of anxiety will decrease °Outcome: Progressing °  °

## 2023-04-14 ENCOUNTER — Inpatient Hospital Stay: Payer: Medicare Other

## 2023-04-14 DIAGNOSIS — I639 Cerebral infarction, unspecified: Secondary | ICD-10-CM | POA: Diagnosis not present

## 2023-04-14 DIAGNOSIS — I5023 Acute on chronic systolic (congestive) heart failure: Secondary | ICD-10-CM | POA: Diagnosis not present

## 2023-04-14 DIAGNOSIS — I214 Non-ST elevation (NSTEMI) myocardial infarction: Secondary | ICD-10-CM

## 2023-04-14 DIAGNOSIS — I469 Cardiac arrest, cause unspecified: Secondary | ICD-10-CM | POA: Diagnosis not present

## 2023-04-14 LAB — RENAL FUNCTION PANEL
Albumin: 2.8 g/dL — ABNORMAL LOW (ref 3.5–5.0)
Anion gap: 11 (ref 5–15)
BUN: 52 mg/dL — ABNORMAL HIGH (ref 8–23)
CO2: 24 mmol/L (ref 22–32)
Calcium: 8.6 mg/dL — ABNORMAL LOW (ref 8.9–10.3)
Chloride: 115 mmol/L — ABNORMAL HIGH (ref 98–111)
Creatinine, Ser: 1.37 mg/dL — ABNORMAL HIGH (ref 0.61–1.24)
GFR, Estimated: 53 mL/min — ABNORMAL LOW (ref >60)
Glucose, Bld: 100 mg/dL — ABNORMAL HIGH (ref 70–99)
Phosphorus: 3.1 mg/dL (ref 2.5–4.6)
Potassium: 3.3 mmol/L — ABNORMAL LOW (ref 3.5–5.1)
Sodium: 150 mmol/L — ABNORMAL HIGH (ref 135–145)

## 2023-04-14 LAB — CULTURE, RESPIRATORY W GRAM STAIN

## 2023-04-14 LAB — CBC
HCT: 30 % — ABNORMAL LOW (ref 39.0–52.0)
Hemoglobin: 9.7 g/dL — ABNORMAL LOW (ref 13.0–17.0)
MCH: 33 pg (ref 26.0–34.0)
MCHC: 32.3 g/dL (ref 30.0–36.0)
MCV: 102 fL — ABNORMAL HIGH (ref 80.0–100.0)
Platelets: 194 10*3/uL (ref 150–400)
RBC: 2.94 MIL/uL — ABNORMAL LOW (ref 4.22–5.81)
RDW: 13 % (ref 11.5–15.5)
WBC: 7 10*3/uL (ref 4.0–10.5)
nRBC: 0 % (ref 0.0–0.2)

## 2023-04-14 LAB — GLUCOSE, CAPILLARY
Glucose-Capillary: 105 mg/dL — ABNORMAL HIGH (ref 70–99)
Glucose-Capillary: 105 mg/dL — ABNORMAL HIGH (ref 70–99)
Glucose-Capillary: 108 mg/dL — ABNORMAL HIGH (ref 70–99)
Glucose-Capillary: 83 mg/dL (ref 70–99)
Glucose-Capillary: 89 mg/dL (ref 70–99)
Glucose-Capillary: 91 mg/dL (ref 70–99)

## 2023-04-14 LAB — MAGNESIUM: Magnesium: 2.2 mg/dL (ref 1.7–2.4)

## 2023-04-14 MED ORDER — HALOPERIDOL LACTATE 5 MG/ML IJ SOLN
INTRAMUSCULAR | Status: AC
  Administered 2023-04-14: 2 mg via INTRAVENOUS
  Filled 2023-04-14: qty 1

## 2023-04-14 MED ORDER — DEXTROSE 5 % IV SOLN: INTRAVENOUS | Status: AC

## 2023-04-14 MED ORDER — POTASSIUM CHLORIDE 10 MEQ/100ML IV SOLN
10.0000 meq | INTRAVENOUS | Status: AC
  Administered 2023-04-14 (×4): 10 meq via INTRAVENOUS
  Filled 2023-04-14 (×4): qty 100

## 2023-04-14 MED ORDER — HALOPERIDOL LACTATE 5 MG/ML IJ SOLN
2.0000 mg | Freq: Four times a day (QID) | INTRAMUSCULAR | Status: DC | PRN
  Administered 2023-04-14: 2 mg via INTRAVENOUS
  Filled 2023-04-14: qty 1

## 2023-04-14 MED ORDER — POTASSIUM CHLORIDE 20 MEQ PO PACK: 40.0000 meq | PACK | ORAL | Status: DC

## 2023-04-14 MED ORDER — ACETAMINOPHEN 10 MG/ML IV SOLN
1000.0000 mg | Freq: Four times a day (QID) | INTRAVENOUS | Status: AC
  Administered 2023-04-14 – 2023-04-15 (×3): 1000 mg via INTRAVENOUS
  Filled 2023-04-14 (×4): qty 100

## 2023-04-14 NOTE — Hospital Course (Signed)
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  He presented to [facility] ER via EMS from [facility] following a witnessed cardiac arrest.  Bystander CPR initiated and EMS arrived on the scene within 30 seconds cardiac rhythm asystole.  ROSC initially achieved, however pt in and out of PEA/asystole.     Pt recently evaluated in the ER on 02/4 with urinary urgency and frequency that started 5 days prior to that presentation.  Diagnosed with a UTI and prescribed Bactrim DS twice daily for 10 days.  However, symptoms persisted prompting [facility] visit today.   ED Course  Upon arrival to the ER pt on NRB with pulse.  It was reported during chest compressions the pt began to fight and pulling at medical devices.  While in the the ER waiting room the pt cardiac arrested again requiring ACLS protocol and mechanical intubation.  Pt with varied cardiac rhythm during cardiac arrest PEA/asystole/complete heart block.  He required transcutaneously pacing.  EKG showed apparent sinus rhythm with complete heart block and ventricular escape rhythm with LBBB morphology.  Total downtime estimated 2hrs from out of hospital cardiac arrest and inpatient cardiac arrest.  Pt required epinephrine gtt due to bradycardia and hypotension.  Pt taken emergently to the cardiac cath lab for temporary transvenous pacemaker placement, emergent cardiac catheterization, and swan-ganz catheter placement.   Cardiac cath revealed severe native CAD with patent LIMA-LAD and SVG-OM. SVG-RCA is occluded, but did not appear acute and unlikely the inciting factor for today's cardiac arrest.  Per cardiology if pt makes a meaningful recovery he may require revascularization of the RCA.  Pt admitted to the ICU post cardiac cath PCCM  consulted to asume care.    Patient was extubated on 2/19.

## 2023-04-14 NOTE — Evaluation (Signed)
Clinical/Bedside Swallow Evaluation Patient Details  Name: Cory Hess MRN: [id number] Date of Birth: 01/18/1946  Today's Date: 04/14/2023 Time: SLP Start Time (ACUTE ONLY): 0845 SLP Stop Time (ACUTE ONLY): 0910 SLP Time Calculation (min) (ACUTE ONLY): 25 min  Past Medical History:  Past Medical History:  Diagnosis Date   Alternating RBBB & LBBB    Ankle swelling 2024   Arthritis    neck   Bladder tumor    CAD (coronary artery disease)    a. 08/2017 STEMI/Cath: LM 80ost/d, LAD 100p, LCX 95 ost/p, RCA 60p, 78m, RPDA 80ost; b. 08/2017 CABG x 3: LIMA->LAD, VG->OM, VG->PDA.   Cancer [facility])    bladder tumor   Cancer of base of tongue (HCC)    Essential hypertension    HFimpEF (heart failure with improved ejection fraction) (HCC)    a. 08/2017 TEE: EF 30-35%, sept/ant/inf HK, apical AK. 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 12/2017 Echo: EF 45-50%, diff HK. Gr1 DD. Mildly reduced RV fxn. Mild RAE; c. 09/2022 Echo: EF 55-60%, mod LVH, GrI DD, nl RV fxn, mildly dil RA, mild MR.   Hyperlipidemia LDL goal <70    Ischemic cardiomyopathy    a. 08/2017 TEE: EF 30-35%; b. 12/2017 Echo: EF 45-50%; c. 09/2022 Echo: EF 55-60%.   Myocardial infarction (HCC) 08/27/2017   Shingles    2018 - right side   Vertigo    several months ago   Past Surgical History:  Past Surgical History:  Procedure Laterality Date   CARDIAC CATHETERIZATION     CATARACT EXTRACTION W/PHACO Left 07/18/2018   Procedure: CATARACT EXTRACTION PHACO AND INTRAOCULAR LENS PLACEMENT (IOC)  LEFT;  Surgeon: Nevada Crane, MD;  Location: [facility];  Service: Ophthalmology;  Laterality: Left;   CATARACT EXTRACTION W/PHACO Right 08/14/2018   Procedure: CATARACT EXTRACTION PHACO AND INTRAOCULAR LENS PLACEMENT (IOC)  RIGHT;  Surgeon: Nevada Crane, MD;  Location: [facility];  Service: Ophthalmology;  Laterality: Right;   CORONARY ARTERY BYPASS GRAFT N/A 08/27/2017   Procedure: CORONARY ARTERY BYPASS GRAFTING (CABG) ON PUMP  USING LEFT INTERNAL MAMMARY ARTERY AND LEFT GREATER SAPHENOUS VEIN VIA ENDOVEIN HARVEST;  Surgeon: Alleen Borne, MD;  Location: [facility];  Service: Open Heart Surgery;  Laterality: N/A;   CORONARY/GRAFT ACUTE MI REVASCULARIZATION N/A 08/27/2017   Procedure: Coronary/Graft Acute MI Revascularization;  Surgeon: Iran Ouch, MD;  Location: [facility];  Service: Cardiovascular;  Laterality: N/A;   CYSTOSCOPY W/ RETROGRADES Bilateral 12/25/2018   Procedure: CYSTOSCOPY WITH RETROGRADE PYELOGRAM;  Surgeon: Vanna Scotland, MD;  Location: [facility];  Service: Urology;  Laterality: Bilateral;   FRACTURE SURGERY Right 1958   arm and left wrist compound fracture, no metal   HERNIA REPAIR Right    inguinial   IR IMAGING GUIDED PORT INSERTION  10/15/2022   LEFT HEART CATH AND CORONARY ANGIOGRAPHY N/A 08/27/2017   Procedure: LEFT HEART CATH AND CORONARY ANGIOGRAPHY;  Surgeon: Iran Ouch, MD;  Location: [facility];  Service: Cardiovascular;  Laterality: N/A;   RIGHT/LEFT HEART CATH AND CORONARY/GRAFT ANGIOGRAPHY N/A 04/05/2023   Procedure: RIGHT/LEFT HEART CATH AND CORONARY/GRAFT ANGIOGRAPHY;  Surgeon: Yvonne Kendall, MD;  Location: [facility];  Service: Cardiovascular;  Laterality: N/A;   TEMPORARY PACEMAKER N/A 04/05/2023   Procedure: TEMPORARY PACEMAKER;  Surgeon: Yvonne Kendall, MD;  Location: [facility];  Service: Cardiovascular;  Laterality: N/A;   TRANSURETHRAL RESECTION OF BLADDER TUMOR WITH MITOMYCIN-C N/A 12/25/2018   Procedure: TRANSURETHRAL RESECTION OF  BLADDER TUMOR WITH Gemcitabine;  Surgeon: Vanna Scotland, MD;  Location: [facility];  Service: Urology;  Laterality: N/A;   HPI:  78 yo male with h/o CAD (s/p CABG x3 on 07/19), alternating LBBB & RBBB, HLD, HTN, CHF, 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 Pt intubated 2/11 and extubated 2/18. Head CT, "IMPRESSION:  1. Previously demonstrated left tongue base mass is less apparent on  the current study and has clearly decreased in size. A region of  asymmetric hyperdensity in the suspected region of the mass measures  approximately 2.5 cm, previously 3.2 cm.  2. Confluent adenopathy at left level 2A appears to have decreased  in size but precise measurement is limited in the absence of  intravenous contrast. If the patient cannot receive IV contrast, MRI  of the neck might be helpful for continued surveillance.  3. No acute intracranial abnormality.    Assessment / Plan / Recommendation  Clinical Impression  Pt seen for clinical swallowing evaluation. Pt alert, pleasantly confused. Talking incessantly and occasionally difficult to redirect. Pt on 6L/min O2 via [place]. Pt with difficulty following commands for oral motor examination. No asymmetry noted. Edentulism obseved. Weak cough appreciated as well as mildly hoarse/hypophonic vocal quality. Pt presents with s/sx at least a mild oral dysphagia including oral holding, delayed oral transit, and intermittent difficulty siphoniing liquids via straw. Concern for pharyngeal dysphagia given seemingly delayed swallow intiation to palpation, multiple swallows (x9 with single straw sip), wet vocal quality, and immediate coughing/throat clearing. Pharyngeal s/sx most pronouced with thin liquids and were relatively mild with ice chip trials. Pt required assistance for feeding during PO trials. Highly suspect a cognitive componenent to pt's dysphagia as well as the sequelae of recent/prolonged intubation in critically ill-patient with multiple comorbidities. Recommend NPO x ice chips for comfort sparingly following oral care by nusing staff. Consideration for short term alternate route of nutrition/hydration/medication. SLP to f/u per POC for clinical  swallowing re-evaluation as well as consideration for MBSS. SLP Visit Diagnosis: Dysphagia, oropharyngeal phase (R13.12)    Aspiration Risk  Moderate aspiration risk;Risk for inadequate nutrition/hydration    Diet Recommendation NPO;Ice chips PRN after oral care    Medication Administration: Via alternative means Supervision: Staff to assist with self feeding (with ice chips and oral care)    Other  Recommendations Oral Care Recommendations: Oral care QID (oral care prior to ice chip)    Recommendations for follow up therapy are one component of a multi-disciplinary discharge planning process, led by the attending physician.  Recommendations may be updated based on patient status, additional functional criteria and insurance authorization.  Follow up Recommendations  (TBD)      Assistance Recommended at Discharge    Functional Status Assessment Patient has had a recent decline in their functional status and demonstrates the ability to make significant improvements in function in a reasonable and predictable amount of time.  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 Pt intubated 2/11 and extubated 2/18. Head CT, "IMPRESSION:  1. Previously demonstrated left tongue base mass is less apparent on  the current study and has clearly decreased in size. A region of  asymmetric hyperdensity in the suspected region of the mass measures  approximately 2.5 cm,  previously 3.2 cm.  2. Confluent adenopathy at left level 2A appears to have decreased  in size but precise measurement is limited in the absence of  intravenous contrast. If the patient cannot receive IV contrast, MRI  of the neck might be helpful for continued surveillance.  3. No acute intracranial abnormality. Type of Study: Bedside Swallow Evaluation Previous Swallow Assessment: none Diet Prior to this Study: NPO Respiratory Status: Nasal cannula (6L/min) History of Recent Intubation: Yes Total duration of intubation (days): 9 days Date extubated: 04/13/23 Behavior/Cognition: Alert;Cooperative;Confused Oral Cavity Assessment: Dry Oral Care Completed by SLP: Yes Oral Cavity - Dentition: Edentulous Self-Feeding Abilities: Needs assist Patient Positioning: Upright in bed Baseline Vocal Quality: Low vocal intensity;Hoarse Volitional Cough: Weak Volitional Swallow: Able to elicit    Oral/Motor/Sensory Function Overall Oral Motor/Sensory Function:  (difficult to assess)   Ice Chips Ice chips: Impaired Presentation: Spoon Oral Phase Impairments: Poor awareness of bolus Oral Phase Functional Implications: Oral holding;Prolonged oral transit Pharyngeal Phase Impairments: Multiple swallows;Throat Clearing - Immediate;Suspected delayed Swallow   Thin Liquid Thin Liquid: Impaired Presentation: Spoon;Straw Oral Phase Impairments: Reduced labial seal Oral Phase Functional Implications:  (difficulty siphoning liquids from straw) Pharyngeal  Phase Impairments: Multiple swallows;Wet Vocal Quality;Throat Clearing - Immediate;Cough - Immediate    Nectar Thick Nectar Thick Liquid: Not tested   Honey Thick Honey Thick Liquid: Not tested   Puree Puree: Not tested   Solid     Solid: Not tested      Alessandra Bevels Claudia Greenley 04/14/2023,12:01 PM

## 2023-04-14 NOTE — Progress Notes (Addendum)
PHARMACY CONSULT NOTE - FOLLOW UP  Pharmacy Consult for Electrolyte Monitoring and Replacement   Recent Labs: Potassium (mmol/L)  Date Value  04/14/2023 3.3 (L)   Magnesium (mg/dL)  Date Value  16/11/9602 2.2   Calcium (mg/dL)  Date Value  54/10/8117 8.6 (L)   Albumin (g/dL)  Date Value  14/78/2956 2.8 (L)  04/01/2021 4.2   Phosphorus (mg/dL)  Date Value  21/30/8657 3.1   Sodium (mmol/L)  Date Value  04/14/2023 150 (H)  10/05/2022 141   Assessment: DC is a 78 yo male who presented to [facility] ED post cardiac arrest. Their downtime was 2 hours. Pharmacy has been consult to manage this patient's electrolytes while in the ICU.   Fluids: NA Nutrition: NA   Pertinent Medications: NA   Goal of Therapy:  Electrolytes WNL (K > 4 and Mg > 2 due to cardiac arrest) Today, 04/14/2023 K = 3.3 Mg = 2.2 Phos = 3.1 Na = 150   Plan:  Replace with order 40 mEq Kcl IV due to lost tube (due to post cardiac arrest) No other replacement indicated at this time Continue to monitor with AM labs   Effie Shy, PharmD Pharmacy Resident  04/14/2023 6:01 AM

## 2023-04-14 NOTE — Progress Notes (Signed)
Nutrition Follow Up Note   DOCUMENTATION CODES:   Obesity unspecified  INTERVENTION:   Recommend NGT placement and nutrition support if patient is unable to be placed on an oral diet within the next 24 hrs  If NGT placed, recommend:  Osmolite 1.5@65ml /hr- Initiate at 20ml/hr and increase by 27ml/hr q 8 hours until goal rate is reached.   ProSource TF 20- Give 60ml daily via tube, each supplement provides 80kcal and 20g of protein.   Free water flushes q4 hours   Regimen provides 2420kcal/day, 118g/day protein and 2317ml/day of free water.   Pt at high refeed risk; recommend monitor potassium, magnesium and phosphorus labs daily until stable  NUTRITION DIAGNOSIS:   Inadequate oral intake related to inability to eat (pt sedated and ventilated) as evidenced by NPO status. 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  Pt with ongoing AMS. Pt seen by SLP today and remains NPO. Pt with worsening hypernatremia; pt is receiving IVF. Recommend NGT placement and nutrition support; this was discussed with MD. Will plan for NGT placement tomorrow if no improvement in pt's mental status. Pt remains at refeed risk. Per chart, pt is down ~5lbs since admission. Pt -1.0L on his I & Os.    Medications reviewed and include: aspirin, plavix, lovenox, insulin, 5% dextrose @75ml /hr, pepcid  Labs reviewed: Na 150(H), K 3.3(L), BUN 52H), creat 1.37(H), P 3.1 wnl, Mg 2.2 wnl Hgb 9.7L), Hct 30.0L) Cbgs- 108, 89, 83 x 24  hrs   UOP-   Diet Order:    Diet Order             Diet NPO time specified  Diet effective now                  EDUCATION NEEDS:   No education needs have been identified at this time  Skin:  Skin Assessment: Reviewed RN Assessment (ecchymosis)  Last BM:  2/20- type 7  Height:   Ht Readings from Last 1 Encounters:  04/05/23 6' (1.829 m)    Weight:   Wt Readings from Last 1 Encounters:  04/14/23 102.6 kg    Ideal Body Weight:  80.9 kg  BMI:  Body mass index is 30.68 kg/m.  Estimated Nutritional Needs:   Kcal:  2200-2500kcal/day  Protein:  110-125g/day  Fluid:  2.1-2.4L/day  Betsey Holiday MS, RD, LDN If unable to be reached, please send secure chat to "RD inpatient" available from 8:00a-4:00p daily

## 2023-04-14 NOTE — Progress Notes (Signed)
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  He presented to [facility] ER via EMS from [facility] following a witnessed cardiac arrest.  Bystander CPR initiated and EMS arrived on the scene within 30 seconds cardiac rhythm asystole.  ROSC initially achieved, however pt in and out of PEA/asystole.     Pt recently evaluated in the ER on 02/4 with urinary urgency and frequency that started 5 days prior to that presentation.  Diagnosed with a UTI and prescribed Bactrim DS twice daily for 10 days.  However, symptoms persisted prompting [facility] visit today.   ED Course  Upon arrival to the ER pt on NRB with pulse.  It was reported during chest compressions the pt began to fight and pulling at medical devices.  While in the the ER waiting room the pt cardiac arrested again requiring ACLS protocol and mechanical intubation.  Pt with varied cardiac rhythm during cardiac arrest PEA/asystole/complete heart block.  He required transcutaneously pacing.  EKG showed apparent sinus rhythm with complete heart block and ventricular escape rhythm with LBBB morphology.  Total downtime estimated 2hrs from out of hospital cardiac arrest and inpatient cardiac arrest.  Pt required epinephrine gtt due to bradycardia and hypotension.  Pt taken emergently to the cardiac cath lab for temporary transvenous pacemaker placement, emergent cardiac catheterization, and swan-ganz catheter placement.   Cardiac cath revealed severe native CAD with patent LIMA-LAD and SVG-OM. SVG-RCA is occluded, but did not appear acute and unlikely the inciting  factor for today's cardiac arrest.  Per cardiology if pt makes a meaningful recovery he may require revascularization of the RCA.  Pt admitted to the ICU post cardiac cath PCCM consulted to asume care.    Patient was extubated on 2/19.   Principal Problem:   Cardiac arrest [facility]) Active Problems:   Heart block AV complete (HCC)   Acute on chronic HFrEF (heart failure with reduced ejection fraction) (HCC)   Hyperkalemia   Acute kidney injury (HCC)   Acute CVA (cerebrovascular accident) (HCC)   Acute respiratory failure with hypoxia and hypercapnia (HCC)   Assessment and Plan: Cardiac arrest with PEA, asystole, completed AV block requiring temporary pacemaker. Acute on chronic systolic congestive heart failure. Cardiogenic shock. Acute on chronic resp failure with hypoxemia and hypercapnia. Right bundle branch block. Non-ST elevation myocardial infarction. Left heart catheterization February 11 with severe native CAD with patent LIMA-LAD and SVG-OM. SVG-RCA is occluded.  Echocardiogram ejection fraction 40 to 45% with grade 1 diastolic dysfunction. Patient is treated in the ICU, was able to be extubated 2/19. Currently, patient still requiring 4-1/2 L oxygen.  Repeat chest x-ray, also recheck BNP level.  Acute kidney injury secondary to cardiogenic shock. Hyperkalemia secondary to acute renal failure. Current hypokalemia. Hypernatremia. Renal function is close to normal.  Patient still has significant hyponatremia, hypokalemia.  Patient was given IV potassium, also added D5 water to decrease sodium level.  Acute metabolic encephalopathy. 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 Patient has been confused since extubation yesterday.  Seen by speech therapy, not able to start a diet. Patient also has significant hyponatremia, will correct the sodium,  follow-up on the mental status.  Consider EEG and neurology reassess. Spoke with patient's sister, patient does not have dementia.  Acute stroke  in the right frontal lobe. Has been seen by neurology, continue to follow.  Urinary tract infection. Patient has finished the 5 days of Zosyn.    Subjective:  Patient is very confused, does not seem to have any shortness of breath.  Physical Exam: Vitals:   04/14/23 1000 04/14/23 1100 04/14/23 1143 04/14/23 1213  BP: (!) 156/70 (!) 163/73  (!) 182/75  Pulse: (!) 103 (!) 104    Resp: (!) 25 (!) 23    Temp:   98.5 F (36.9 C)   TempSrc:   Oral   SpO2: 98% 99%    Weight:      Height:       General exam: Appears calm and comfortable  Respiratory system: Clear to auscultation. Respiratory effort normal. Cardiovascular system: S1 & S2 heard, RRR. No JVD, murmurs, rubs, gallops or clicks. No pedal edema. Gastrointestinal system: Abdomen is nondistended, soft and nontender. No organomegaly or masses felt. Normal bowel sounds heard. Central nervous system: Alert and confused. Extremities: Symmetric 5 x 5 power. Skin: No rashes, lesions or ulcers .    Data Reviewed:  Reviewed heart cath report, echocardiogram report, imaging studies and lab results.  Family Communication: Sister updated over the phone.  Disposition: Status is: Inpatient Remains inpatient appropriate because: Severity of disease.     Time spent: 65 minutes  Author: Marrion Coy, MD 04/14/2023 12:43 PM  For on call review [email or web address].

## 2023-04-14 NOTE — Plan of Care (Signed)
Problem: Education: Goal: Knowledge of General Education information will improve Description: Including pain rating scale, medication(s)/side effects and non-pharmacologic comfort measures Outcome: Progressing   Problem: Health Behavior/Discharge Planning: Goal: Ability to manage health-related needs will improve Outcome: Progressing   Problem: Clinical Measurements: Goal: Ability to maintain clinical measurements within normal limits will improve Outcome: Progressing Goal: Will remain free from infection Outcome: Progressing Goal: Diagnostic test results will improve Outcome: Progressing Goal: Respiratory complications will improve Outcome: Progressing Goal: Cardiovascular complication will be avoided Outcome: Progressing   Problem: Activity: Goal: Risk for activity intolerance will decrease Outcome: Progressing   Problem: Nutrition: Goal: Adequate nutrition will be maintained Outcome: Progressing   Problem: Coping: Goal: Level of anxiety will decrease Outcome: Progressing   Problem: Elimination: Goal: Will not experience complications related to bowel motility Outcome: Progressing Goal: Will not experience complications related to urinary retention Outcome: Progressing   Problem: Pain Managment: Goal: General experience of comfort will improve and/or be controlled Outcome: Progressing   Problem: Safety: Goal: Ability to remain free from injury will improve Outcome: Progressing   Problem: Skin Integrity: Goal: Risk for impaired skin integrity will decrease Outcome: Progressing   Problem: Education: Goal: Understanding of CV disease, CV risk reduction, and recovery process will improve Outcome: Progressing   Problem: Activity: Goal: Ability to return to baseline activity level will improve Outcome: Progressing   Problem: Cardiovascular: Goal: Ability to achieve and maintain adequate cardiovascular perfusion will improve Outcome: Progressing Goal:  Vascular access site(s) Level 0-1 will be maintained Outcome: Progressing   Problem: Health Behavior/Discharge Planning: Goal: Ability to safely manage health-related needs after discharge will improve Outcome: Progressing   Problem: Education: Goal: Ability to describe self-care measures that may prevent or decrease complications (Diabetes Survival Skills Education) will improve Outcome: Progressing   Problem: Coping: Goal: Ability to adjust to condition or change in health will improve Outcome: Progressing   Problem: Fluid Volume: Goal: Ability to maintain a balanced intake and output will improve Outcome: Progressing   Problem: Health Behavior/Discharge Planning: Goal: Ability to identify and utilize available resources and services will improve Outcome: Progressing Goal: Ability to manage health-related needs will improve Outcome: Progressing   Problem: Metabolic: Goal: Ability to maintain appropriate glucose levels will improve Outcome: Progressing   Problem: Nutritional: Goal: Maintenance of adequate nutrition will improve Outcome: Progressing Goal: Progress toward achieving an optimal weight will improve Outcome: Progressing   Problem: Skin Integrity: Goal: Risk for impaired skin integrity will decrease Outcome: Progressing   Problem: Tissue Perfusion: Goal: Adequacy of tissue perfusion will improve Outcome: Progressing   Problem: Education: Goal: Understanding of CV disease, CV risk reduction, and recovery process will improve Outcome: Progressing   Problem: Activity: Goal: Ability to return to baseline activity level will improve Outcome: Progressing   Problem: Cardiovascular: Goal: Ability to achieve and maintain adequate cardiovascular perfusion will improve Outcome: Progressing Goal: Vascular access site(s) Level 0-1 will be maintained Outcome: Progressing   Problem: Health Behavior/Discharge Planning: Goal: Ability to safely manage health-related  needs after discharge will improve Outcome: Progressing   Problem: Education: Goal: Knowledge of disease or condition will improve Outcome: Progressing Goal: Knowledge of secondary prevention will improve (MUST DOCUMENT ALL) Outcome: Progressing Goal: Knowledge of patient specific risk factors will improve (DELETE if not current risk factor) Outcome: Progressing   Problem: Ischemic Stroke/TIA Tissue Perfusion: Goal: Complications of ischemic stroke/TIA will be minimized Outcome: Progressing   Problem: Coping: Goal: Will verbalize positive feelings about self Outcome: Progressing Goal: Will identify appropriate support  needs Outcome: Progressing   Problem: Health Behavior/Discharge Planning: Goal: Ability to manage health-related needs will improve Outcome: Progressing Goal: Goals will be collaboratively established with patient/family Outcome: Progressing

## 2023-04-15 ENCOUNTER — Inpatient Hospital Stay: Payer: Medicare Other

## 2023-04-15 DIAGNOSIS — I469 Cardiac arrest, cause unspecified: Secondary | ICD-10-CM | POA: Diagnosis not present

## 2023-04-15 DIAGNOSIS — I2699 Other pulmonary embolism without acute cor pulmonale: Secondary | ICD-10-CM | POA: Diagnosis not present

## 2023-04-15 DIAGNOSIS — Z86711 Personal history of pulmonary embolism: Secondary | ICD-10-CM

## 2023-04-15 DIAGNOSIS — J9601 Acute respiratory failure with hypoxia: Secondary | ICD-10-CM | POA: Diagnosis not present

## 2023-04-15 DIAGNOSIS — G9341 Metabolic encephalopathy: Secondary | ICD-10-CM | POA: Diagnosis not present

## 2023-04-15 DIAGNOSIS — E870 Hyperosmolality and hypernatremia: Secondary | ICD-10-CM

## 2023-04-15 HISTORY — DX: Other pulmonary embolism without acute cor pulmonale: I26.99

## 2023-04-15 LAB — GLUCOSE, CAPILLARY
Glucose-Capillary: 100 mg/dL — ABNORMAL HIGH (ref 70–99)
Glucose-Capillary: 104 mg/dL — ABNORMAL HIGH (ref 70–99)
Glucose-Capillary: 119 mg/dL — ABNORMAL HIGH (ref 70–99)
Glucose-Capillary: 122 mg/dL — ABNORMAL HIGH (ref 70–99)
Glucose-Capillary: 174 mg/dL — ABNORMAL HIGH (ref 70–99)
Glucose-Capillary: 93 mg/dL (ref 70–99)

## 2023-04-15 LAB — BLOOD GAS, ARTERIAL
Acid-base deficit: 1.3 mmol/L (ref 0.0–2.0)
Bicarbonate: 23.7 mmol/L (ref 20.0–28.0)
FIO2: 100 %
MECHVT: 500 mL
Mechanical Rate: 15
O2 Saturation: 100 %
PEEP: 5 cmH2O
Patient temperature: 37
pCO2 arterial: 40 mm[Hg] (ref 32–48)
pH, Arterial: 7.38 (ref 7.35–7.45)
pO2, Arterial: 175 mm[Hg] — ABNORMAL HIGH (ref 83–108)

## 2023-04-15 LAB — RENAL FUNCTION PANEL
Albumin: 2.6 g/dL — ABNORMAL LOW (ref 3.5–5.0)
Anion gap: 11 (ref 5–15)
BUN: 49 mg/dL — ABNORMAL HIGH (ref 8–23)
CO2: 21 mmol/L — ABNORMAL LOW (ref 22–32)
Calcium: 8.7 mg/dL — ABNORMAL LOW (ref 8.9–10.3)
Chloride: 117 mmol/L — ABNORMAL HIGH (ref 98–111)
Creatinine, Ser: 1.48 mg/dL — ABNORMAL HIGH (ref 0.61–1.24)
GFR, Estimated: 49 mL/min — ABNORMAL LOW (ref >60)
Glucose, Bld: 174 mg/dL — ABNORMAL HIGH (ref 70–99)
Phosphorus: 5.6 mg/dL — ABNORMAL HIGH (ref 2.5–4.6)
Potassium: 3.3 mmol/L — ABNORMAL LOW (ref 3.5–5.1)
Sodium: 149 mmol/L — ABNORMAL HIGH (ref 135–145)

## 2023-04-15 LAB — COOXEMETRY PANEL
Carboxyhemoglobin: 2.5 % — ABNORMAL HIGH (ref 0.5–1.5)
Methemoglobin: 0.8 % (ref 0.0–1.5)
O2 Saturation: 77.7 %
Total hemoglobin: 14.3 g/dL (ref 12.0–16.0)
Total oxygen content: 75.2 %

## 2023-04-15 LAB — MAGNESIUM: Magnesium: 2.4 mg/dL (ref 1.7–2.4)

## 2023-04-15 LAB — LACTIC ACID, PLASMA
Lactic Acid, Venous: 2.8 mmol/L (ref 0.5–1.9)
Lactic Acid, Venous: 1.1 mmol/L (ref 0.5–1.9)

## 2023-04-15 LAB — CBC
HCT: 29.7 % — ABNORMAL LOW (ref 39.0–52.0)
Hemoglobin: 9.8 g/dL — ABNORMAL LOW (ref 13.0–17.0)
MCH: 33.1 pg (ref 26.0–34.0)
MCHC: 33 g/dL (ref 30.0–36.0)
MCV: 100.3 fL — ABNORMAL HIGH (ref 80.0–100.0)
Platelets: 226 10*3/uL (ref 150–400)
RBC: 2.96 MIL/uL — ABNORMAL LOW (ref 4.22–5.81)
RDW: 13.1 % (ref 11.5–15.5)
WBC: 8.3 10*3/uL (ref 4.0–10.5)
nRBC: 0 % (ref 0.0–0.2)

## 2023-04-15 LAB — TROPONIN I (HIGH SENSITIVITY)
Troponin I (High Sensitivity): 74 ng/L — ABNORMAL HIGH
Troponin I (High Sensitivity): 107 ng/L (ref <18)
Troponin I (High Sensitivity): 85 ng/L — ABNORMAL HIGH (ref <18)

## 2023-04-15 LAB — HEPARIN LEVEL (UNFRACTIONATED): Heparin Unfractionated: 0.16 [IU]/mL — ABNORMAL LOW (ref 0.30–0.70)

## 2023-04-15 LAB — PROCALCITONIN: Procalcitonin: 0.22 ng/mL

## 2023-04-15 LAB — BRAIN NATRIURETIC PEPTIDE: B Natriuretic Peptide: 498.8 pg/mL — ABNORMAL HIGH (ref 0.0–100.0)

## 2023-04-15 MED ORDER — SODIUM CHLORIDE 0.9 % IV SOLN
3.0000 g | Freq: Four times a day (QID) | INTRAVENOUS | Status: DC
  Administered 2023-04-15: 3 g via INTRAVENOUS
  Filled 2023-04-15 (×3): qty 8

## 2023-04-15 MED ORDER — HEPARIN BOLUS VIA INFUSION
3000.0000 [IU] | Freq: Once | INTRAVENOUS | Status: AC
  Administered 2023-04-15: 3000 [IU] via INTRAVENOUS
  Filled 2023-04-15: qty 3000

## 2023-04-15 MED ORDER — ACETAMINOPHEN 10 MG/ML IV SOLN
1000.0000 mg | Freq: Once | INTRAVENOUS | Status: AC
  Administered 2023-04-15: 1000 mg via INTRAVENOUS
  Filled 2023-04-15: qty 100

## 2023-04-15 MED ORDER — PROPOFOL 1000 MG/100ML IV EMUL
5.0000 ug/kg/min | INTRAVENOUS | Status: DC
  Administered 2023-04-15 (×3): 50 ug/kg/min via INTRAVENOUS
  Administered 2023-04-15: 30 ug/kg/min via INTRAVENOUS
  Administered 2023-04-15 (×3): 50 ug/kg/min via INTRAVENOUS
  Administered 2023-04-16: 40 ug/kg/min via INTRAVENOUS
  Administered 2023-04-16 (×3): 50 ug/kg/min via INTRAVENOUS
  Administered 2023-04-16 – 2023-04-18 (×14): 40 ug/kg/min via INTRAVENOUS
  Administered 2023-04-19: 30 ug/kg/min via INTRAVENOUS
  Administered 2023-04-19: 40 ug/kg/min via INTRAVENOUS
  Administered 2023-04-19: 30 ug/kg/min via INTRAVENOUS
  Administered 2023-04-19 (×2): 40 ug/kg/min via INTRAVENOUS
  Administered 2023-04-20 (×3): 30 ug/kg/min via INTRAVENOUS
  Administered 2023-04-20: 40 ug/kg/min via INTRAVENOUS
  Administered 2023-04-21: 30 ug/kg/min via INTRAVENOUS
  Administered 2023-04-21: 20 ug/kg/min via INTRAVENOUS
  Administered 2023-04-21: 30 ug/kg/min via INTRAVENOUS
  Filled 2023-04-15 (×37): qty 100

## 2023-04-15 MED ORDER — VITAL HIGH PROTEIN PO LIQD
1000.0000 mL | ORAL | Status: DC
  Administered 2023-04-15 – 2023-04-20 (×7): 1000 mL

## 2023-04-15 MED ORDER — POTASSIUM CHLORIDE 20 MEQ PO PACK
40.0000 meq | PACK | Freq: Once | ORAL | Status: AC
  Administered 2023-04-15: 40 meq
  Filled 2023-04-15: qty 2

## 2023-04-15 MED ORDER — ORAL CARE MOUTH RINSE: 15.0000 mL | OROMUCOSAL | Status: DC | PRN

## 2023-04-15 MED ORDER — NOREPINEPHRINE 4 MG/250ML-% IV SOLN
0.0000 ug/min | INTRAVENOUS | Status: DC
  Administered 2023-04-15 – 2023-04-16 (×2): 2 ug/min via INTRAVENOUS
  Filled 2023-04-15: qty 250

## 2023-04-15 MED ORDER — NOREPINEPHRINE 4 MG/250ML-% IV SOLN
INTRAVENOUS | Status: AC
  Administered 2023-04-15: 4 mg
  Filled 2023-04-15: qty 250

## 2023-04-15 MED ORDER — POLYETHYLENE GLYCOL 3350 17 G PO PACK: 17.0000 g | PACK | Freq: Every day | ORAL | Status: DC | PRN

## 2023-04-15 MED ORDER — FENTANYL CITRATE PF 50 MCG/ML IJ SOSY
PREFILLED_SYRINGE | INTRAMUSCULAR | Status: AC
  Administered 2023-04-15: 25 ug via INTRAVENOUS
  Filled 2023-04-15: qty 1

## 2023-04-15 MED ORDER — DOCUSATE SODIUM 50 MG/5ML PO LIQD: 100.0000 mg | Freq: Two times a day (BID) | ORAL | Status: DC | PRN

## 2023-04-15 MED ORDER — FENTANYL CITRATE PF 50 MCG/ML IJ SOSY
50.0000 ug | PREFILLED_SYRINGE | Freq: Once | INTRAMUSCULAR | Status: AC
  Administered 2023-04-15: 50 ug via INTRAVENOUS

## 2023-04-15 MED ORDER — HEPARIN (PORCINE) 25000 UT/250ML-% IV SOLN
1800.0000 [IU]/h | INTRAVENOUS | Status: DC
  Administered 2023-04-15: 900 [IU]/h via INTRAVENOUS
  Administered 2023-04-16: 1400 [IU]/h via INTRAVENOUS
  Administered 2023-04-16: 1200 [IU]/h via INTRAVENOUS
  Administered 2023-04-17: 1400 [IU]/h via INTRAVENOUS
  Administered 2023-04-18 – 2023-04-19 (×3): 1600 [IU]/h via INTRAVENOUS
  Administered 2023-04-20 – 2023-04-21 (×2): 1800 [IU]/h via INTRAVENOUS
  Filled 2023-04-15 (×9): qty 250

## 2023-04-15 MED ORDER — HEPARIN BOLUS VIA INFUSION
4000.0000 [IU] | Freq: Once | INTRAVENOUS | Status: AC
  Administered 2023-04-15: 4000 [IU] via INTRAVENOUS
  Filled 2023-04-15: qty 4000

## 2023-04-15 MED ORDER — POLYETHYLENE GLYCOL 3350 17 G PO PACK: 17.0000 g | PACK | Freq: Every day | ORAL | Status: DC

## 2023-04-15 MED ORDER — DOCUSATE SODIUM 50 MG/5ML PO LIQD: 100.0000 mg | Freq: Two times a day (BID) | ORAL | Status: DC

## 2023-04-15 MED ORDER — PROPOFOL 1000 MG/100ML IV EMUL
INTRAVENOUS | Status: AC
Start: 2023-04-15 — End: 2023-04-15
  Filled 2023-04-15: qty 100

## 2023-04-15 MED ORDER — FREE WATER
200.0000 mL | Status: DC
  Administered 2023-04-15 – 2023-04-21 (×35): 200 mL

## 2023-04-15 MED ORDER — PROSOURCE TF20 ENFIT COMPATIBL EN LIQD
60.0000 mL | Freq: Every day | ENTERAL | Status: DC
  Administered 2023-04-16 – 2023-04-20 (×5): 60 mL
  Filled 2023-04-15 (×5): qty 60

## 2023-04-15 MED ORDER — ORAL CARE MOUTH RINSE
15.0000 mL | OROMUCOSAL | Status: DC
  Administered 2023-04-15 – 2023-04-21 (×77): 15 mL via OROMUCOSAL

## 2023-04-15 MED ORDER — IOHEXOL 350 MG/ML SOLN
100.0000 mL | Freq: Once | INTRAVENOUS | Status: AC | PRN
  Administered 2023-04-15: 75 mL via INTRAVENOUS

## 2023-04-15 MED ORDER — FENTANYL CITRATE PF 50 MCG/ML IJ SOSY
25.0000 ug | PREFILLED_SYRINGE | INTRAMUSCULAR | Status: DC | PRN
  Administered 2023-04-15: 25 ug via INTRAVENOUS
  Filled 2023-04-15: qty 1

## 2023-04-15 MED ORDER — FENTANYL CITRATE PF 50 MCG/ML IJ SOSY
25.0000 ug | PREFILLED_SYRINGE | INTRAMUSCULAR | Status: DC | PRN
  Administered 2023-04-15: 25 ug via INTRAVENOUS
  Administered 2023-04-16 (×2): 100 ug via INTRAVENOUS
  Filled 2023-04-15: qty 2
  Filled 2023-04-15: qty 1
  Filled 2023-04-15: qty 2

## 2023-04-15 MED ORDER — MIDAZOLAM HCL 2 MG/2ML IJ SOLN
2.0000 mg | Freq: Once | INTRAMUSCULAR | Status: AC
  Administered 2023-04-15: 2 mg via INTRAVENOUS
  Filled 2023-04-15: qty 2

## 2023-04-15 MED ORDER — MIDAZOLAM HCL 2 MG/2ML IJ SOLN
1.0000 mg | INTRAMUSCULAR | Status: DC | PRN
  Administered 2023-04-15: 1 mg via INTRAVENOUS
  Administered 2023-04-15 – 2023-04-16 (×2): 2 mg via INTRAVENOUS
  Filled 2023-04-15 (×3): qty 2

## 2023-04-15 NOTE — Plan of Care (Signed)
Problem: Education: Goal: Knowledge of General Education information will improve Description: Including pain rating scale, medication(s)/side effects and non-pharmacologic comfort measures 04/15/2023 1944 by Dorma Russell, RN Outcome: Progressing 04/15/2023 1944 by Dorma Russell, RN Outcome: Progressing   Problem: Health Behavior/Discharge Planning: Goal: Ability to manage health-related needs will improve 04/15/2023 1944 by Dorma Russell, RN Outcome: Progressing 04/15/2023 1944 by Dorma Russell, RN Outcome: Progressing   Problem: Clinical Measurements: Goal: Ability to maintain clinical measurements within normal limits will improve 04/15/2023 1944 by Dorma Russell, RN Outcome: Progressing 04/15/2023 1944 by Dorma Russell, RN Outcome: Progressing Goal: Will remain free from infection 04/15/2023 1944 by Dorma Russell, RN Outcome: Progressing 04/15/2023 1944 by Dorma Russell, RN Outcome: Progressing Goal: Diagnostic test results will improve 04/15/2023 1944 by Dorma Russell, RN Outcome: Progressing 04/15/2023 1944 by Dorma Russell, RN Outcome: Progressing Goal: Respiratory complications will improve 04/15/2023 1944 by Dorma Russell, RN Outcome: Progressing 04/15/2023 1944 by Dorma Russell, RN Outcome: Progressing Goal: Cardiovascular complication will be avoided 04/15/2023 1944 by Dorma Russell, RN Outcome: Progressing 04/15/2023 1944 by Dorma Russell, RN Outcome: Progressing   Problem: Activity: Goal: Risk for activity intolerance will decrease 04/15/2023 1944 by Dorma Russell, RN Outcome: Progressing 04/15/2023 1944 by Dorma Russell, RN Outcome: Progressing   Problem: Nutrition: Goal: Adequate nutrition will be maintained 04/15/2023 1944 by Dorma Russell, RN Outcome: Progressing 04/15/2023 1944 by Dorma Russell, RN Outcome: Progressing   Problem: Coping: Goal: Level of anxiety will decrease 04/15/2023 1944 by Dorma Russell, RN Outcome:  Progressing 04/15/2023 1944 by Dorma Russell, RN Outcome: Progressing   Problem: Elimination: Goal: Will not experience complications related to bowel motility 04/15/2023 1944 by Dorma Russell, RN Outcome: Progressing 04/15/2023 1944 by Dorma Russell, RN Outcome: Progressing Goal: Will not experience complications related to urinary retention 04/15/2023 1944 by Dorma Russell, RN Outcome: Progressing 04/15/2023 1944 by Dorma Russell, RN Outcome: Progressing   Problem: Pain Managment: Goal: General experience of comfort will improve and/or be controlled 04/15/2023 1944 by Dorma Russell, RN Outcome: Progressing 04/15/2023 1944 by Dorma Russell, RN Outcome: Progressing   Problem: Safety: Goal: Ability to remain free from injury will improve 04/15/2023 1944 by Dorma Russell, RN Outcome: Progressing 04/15/2023 1944 by Dorma Russell, RN Outcome: Progressing   Problem: Skin Integrity: Goal: Risk for impaired skin integrity will decrease 04/15/2023 1944 by Dorma Russell, RN Outcome: Progressing 04/15/2023 1944 by Dorma Russell, RN Outcome: Progressing   Problem: Education: Goal: Understanding of CV disease, CV risk reduction, and recovery process will improve 04/15/2023 1944 by Dorma Russell, RN Outcome: Progressing 04/15/2023 1944 by Dorma Russell, RN Outcome: Progressing   Problem: Activity: Goal: Ability to return to baseline activity level will improve 04/15/2023 1944 by Dorma Russell, RN Outcome: Progressing 04/15/2023 1944 by Dorma Russell, RN Outcome: Progressing   Problem: Cardiovascular: Goal: Ability to achieve and maintain adequate cardiovascular perfusion will improve 04/15/2023 1944 by Dorma Russell, RN Outcome: Progressing 04/15/2023 1944 by Dorma Russell, RN Outcome: Progressing Goal: Vascular access site(s) Level 0-1 will be maintained 04/15/2023 1944 by Dorma Russell, RN Outcome: Progressing 04/15/2023 1944 by Dorma Russell,  RN Outcome: Progressing   Problem: Health Behavior/Discharge Planning: Goal: Ability to safely manage health-related needs after discharge will improve 04/15/2023 1944 by Dorma Russell, RN Outcome: Progressing 04/15/2023 1944 by Dorma Russell, RN  Outcome: Progressing   Problem: Education: Goal: Ability to describe self-care measures that may prevent or decrease complications (Diabetes Survival Skills Education) will improve 04/15/2023 1944 by Dorma Russell, RN Outcome: Progressing 04/15/2023 1944 by Dorma Russell, RN Outcome: Progressing   Problem: Coping: Goal: Ability to adjust to condition or change in health will improve Outcome: Progressing   Problem: Fluid Volume: Goal: Ability to maintain a balanced intake and output will improve Outcome: Progressing   Problem: Health Behavior/Discharge Planning: Goal: Ability to identify and utilize available resources and services will improve Outcome: Progressing Goal: Ability to manage health-related needs will improve Outcome: Progressing   Problem: Metabolic: Goal: Ability to maintain appropriate glucose levels will improve Outcome: Progressing   Problem: Nutritional: Goal: Maintenance of adequate nutrition will improve Outcome: Progressing Goal: Progress toward achieving an optimal weight will improve Outcome: Progressing   Problem: Skin Integrity: Goal: Risk for impaired skin integrity will decrease Outcome: Progressing   Problem: Tissue Perfusion: Goal: Adequacy of tissue perfusion will improve Outcome: Progressing   Problem: Education: Goal: Understanding of CV disease, CV risk reduction, and recovery process will improve Outcome: Progressing   Problem: Activity: Goal: Ability to return to baseline activity level will improve Outcome: Progressing   Problem: Cardiovascular: Goal: Ability to achieve and maintain adequate cardiovascular perfusion will improve Outcome: Progressing Goal: Vascular access  site(s) Level 0-1 will be maintained Outcome: Progressing   Problem: Health Behavior/Discharge Planning: Goal: Ability to safely manage health-related needs after discharge will improve Outcome: Progressing   Problem: Education: Goal: Knowledge of disease or condition will improve Outcome: Progressing Goal: Knowledge of secondary prevention will improve (MUST DOCUMENT ALL) Outcome: Progressing Goal: Knowledge of patient specific risk factors will improve (DELETE if not current risk factor) Outcome: Progressing   Problem: Ischemic Stroke/TIA Tissue Perfusion: Goal: Complications of ischemic stroke/TIA will be minimized Outcome: Progressing   Problem: Coping: Goal: Will verbalize positive feelings about self Outcome: Progressing Goal: Will identify appropriate support needs Outcome: Progressing   Problem: Health Behavior/Discharge Planning: Goal: Ability to manage health-related needs will improve Outcome: Progressing Goal: Goals will be collaboratively established with patient/family Outcome: Progressing

## 2023-04-15 NOTE — TOC CM/SW Note (Addendum)
Transition of Care [facility]) - Inpatient Brief Assessment   Patient Details  Name: Cory Hess MRN: [id number] Date of Birth: [birth year]  Transition of [facility] CM/SW Contact:    Margarito Liner, LCSW Phone Number: 04/15/2023, 10:24 AM   Clinical Narrative: CSW reviewed chart. Patient has been reintubated. Will complete readmission prevention screen when appropriate. Patient does not have a PCP. CSW added list to his AVS.  Transition of Care Asessment: Insurance and Status: Insurance coverage has been reviewed Patient has primary care physician: No Home environment has been reviewed: Single family home Prior level of function:: Not documented Prior/Current Home Services: No current home services Social Drivers of Health Review: SDOH reviewed no interventions necessary Readmission risk has been reviewed: Yes Transition of care needs: transition of care needs identified, TOC will continue to follow

## 2023-04-15 NOTE — Procedures (Signed)
Intubation Procedure Note  Martavis Gurney  [id number]  03-21-1945  Date:04/15/23  Time:3:32 AM   Provider Performing:Rafe Mackowski L [place]    Procedure: Intubation (31500)  Indication(s) Respiratory Failure  Consent Unable to obtain consent due to emergent nature of procedure.   Anesthesia Fentanyl   Time Out Verified patient identification, verified procedure, site/side was marked, verified correct patient position, special equipment/implants available, medications/allergies/relevant history reviewed, required imaging and test results available.   Sterile Technique Usual hand hygeine, masks, and gloves were used   Procedure Description Patient positioned in bed supine.  Sedation given as noted above.  Patient was intubated with endotracheal tube using Glidescope.  View was Grade 1 full glottis .  Number of attempts was 1.  Colorimetric CO2 detector was consistent with tracheal placement.   Complications/Tolerance None; patient tolerated the procedure well. Chest X-ray is ordered to verify placement.   EBL Minimal   Specimen(s) None   Betsey Holiday, AGACNP-BC Acute Care Nurse Practitioner [facility]   [phone] / [phone] Please see Amion for details.

## 2023-04-15 NOTE — Discharge Instructions (Signed)
 Some PCP options in [place] area- not a comprehensive list  [facility]- [phone] [facility]- [phone] [facility]- [phone] [facility]- [phone] [facility]- [phone] [facility]- [phone]  or [facility] Physician Referral Line [phone]

## 2023-04-15 NOTE — Progress Notes (Signed)
Nutrition Follow Up Note   DOCUMENTATION CODES:   Obesity unspecified  INTERVENTION:   Recommend NGT placement prior to extubation   Vital HP @60ml /hr- Initiate at 28ml/hr and increase by 35ml/hr q 8 hours until goal rate is reached.   ProSource TF 20- Give 60ml daily via tube, each supplement provides 80kcal and 20g of protein.   Propofol: 30.8 ml/hr- provides 813kcal/day   Free water flushes q4 hours   Regimen provides 2333kcal/day, 146g/day protein and 2435ml/day of free water.   Pt at high refeed risk; recommend monitor potassium, magnesium and phosphorus labs daily until stable  NUTRITION DIAGNOSIS:   Inadequate oral intake related to inability to eat (pt sedated and ventilated) as evidenced by NPO status. 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  Pt re-intubated today secondary to hypoxia and new PEs. OGT placed and is noted gastric. Will plan to initiate tube feeds today. Pt is at refeed risk. Pt with mild hypernatremia. Per chart, pt is down 5lbs since admission; pt -2.5L on his I & Os. RD will add free water. Pt with diarrhea today; hold bowel regimen. Pt will need NGT placement prior to extubation as he was unable to pass SLP evaluation previously.    Medications reviewed and include: aspirin, plavix, colace, insulin, miralax, pepcid, heparin, levophed, propofol   Labs reviewed: Na 149(H), K 3.3(L), BUN 49(H), creat 1.48(H), P 5.6(H), Mg 2.4 wnl Hgb 9.8(L), Hct 29.7(L) Cbgs- 119, 122, 174 x  24 hrs   Patient is currently intubated on ventilator support MV: 9.3 L/min Temp (24hrs), Avg:98.3 F (36.8 C), Min:97.9 F (36.6 C), Max:98.6 F (37 C)  Propofol: 30.8 ml/hr- provides 813kcal/day   UOP-   Diet Order:    Diet Order             Diet NPO time specified  Diet effective now                  EDUCATION NEEDS:   No education needs have been identified at this time  Skin:  Skin Assessment: Reviewed RN Assessment (ecchymosis)  Last BM:  2/21- via rectal tube  Height:   Ht Readings from Last 1 Encounters:  04/15/23 6' 0.01" (1.829 m)    Weight:   Wt Readings from Last 1 Encounters:  04/15/23 102.7 kg    Ideal Body Weight:  80.9 kg  BMI:  Body mass index is 30.7 kg/m.  Estimated Nutritional Needs:   Kcal:  2000-2300kcal/day  Protein:  135-150g/day  Fluid:  2.1-2.4L/day  Betsey Holiday MS, RD, LDN If unable to be reached, please send secure chat to "RD inpatient" available from 8:00a-4:00p daily

## 2023-04-15 NOTE — Plan of Care (Signed)
Problem: Education: Goal: Knowledge of General Education information will improve Description: Including pain rating scale, medication(s)/side effects and non-pharmacologic comfort measures Outcome: Not Progressing   Problem: Health Behavior/Discharge Planning: Goal: Ability to manage health-related needs will improve Outcome: Not Progressing   Problem: Clinical Measurements: Goal: Ability to maintain clinical measurements within normal limits will improve Outcome: Not Progressing Goal: Will remain free from infection Outcome: Not Progressing Goal: Diagnostic test results will improve Outcome: Not Progressing Goal: Respiratory complications will improve Outcome: Not Progressing Goal: Cardiovascular complication will be avoided Outcome: Not Progressing   Problem: Activity: Goal: Risk for activity intolerance will decrease Outcome: Not Progressing   Problem: Nutrition: Goal: Adequate nutrition will be maintained Outcome: Not Progressing   Problem: Coping: Goal: Level of anxiety will decrease Outcome: Not Progressing   Problem: Elimination: Goal: Will not experience complications related to bowel motility Outcome: Not Progressing Goal: Will not experience complications related to urinary retention Outcome: Not Progressing   Problem: Pain Managment: Goal: General experience of comfort will improve and/or be controlled Outcome: Not Progressing   Problem: Safety: Goal: Ability to remain free from injury will improve Outcome: Not Progressing   Problem: Skin Integrity: Goal: Risk for impaired skin integrity will decrease Outcome: Not Progressing   Problem: Education: Goal: Understanding of CV disease, CV risk reduction, and recovery process will improve Outcome: Not Progressing   Problem: Activity: Goal: Ability to return to baseline activity level will improve Outcome: Not Progressing   Problem: Cardiovascular: Goal: Ability to achieve and maintain adequate  cardiovascular perfusion will improve Outcome: Not Progressing Goal: Vascular access site(s) Level 0-1 will be maintained Outcome: Not Progressing   Problem: Health Behavior/Discharge Planning: Goal: Ability to safely manage health-related needs after discharge will improve Outcome: Not Progressing   Problem: Education: Goal: Ability to describe self-care measures that may prevent or decrease complications (Diabetes Survival Skills Education) will improve Outcome: Not Progressing   Problem: Coping: Goal: Ability to adjust to condition or change in health will improve Outcome: Not Progressing   Problem: Fluid Volume: Goal: Ability to maintain a balanced intake and output will improve Outcome: Not Progressing   Problem: Health Behavior/Discharge Planning: Goal: Ability to identify and utilize available resources and services will improve Outcome: Not Progressing Goal: Ability to manage health-related needs will improve Outcome: Not Progressing   Problem: Metabolic: Goal: Ability to maintain appropriate glucose levels will improve Outcome: Not Progressing   Problem: Nutritional: Goal: Maintenance of adequate nutrition will improve Outcome: Not Progressing Goal: Progress toward achieving an optimal weight will improve Outcome: Not Progressing   Problem: Skin Integrity: Goal: Risk for impaired skin integrity will decrease Outcome: Not Progressing   Problem: Tissue Perfusion: Goal: Adequacy of tissue perfusion will improve Outcome: Not Progressing   Problem: Education: Goal: Understanding of CV disease, CV risk reduction, and recovery process will improve Outcome: Not Progressing   Problem: Activity: Goal: Ability to return to baseline activity level will improve Outcome: Not Progressing   Problem: Cardiovascular: Goal: Ability to achieve and maintain adequate cardiovascular perfusion will improve Outcome: Not Progressing Goal: Vascular access site(s) Level 0-1 will  be maintained Outcome: Not Progressing   Problem: Health Behavior/Discharge Planning: Goal: Ability to safely manage health-related needs after discharge will improve Outcome: Not Progressing   Problem: Education: Goal: Knowledge of disease or condition will improve Outcome: Not Progressing Goal: Knowledge of secondary prevention will improve (MUST DOCUMENT ALL) Outcome: Not Progressing Goal: Knowledge of patient specific risk factors will improve (DELETE if not current risk factor)  Outcome: Not Progressing   Problem: Ischemic Stroke/TIA Tissue Perfusion: Goal: Complications of ischemic stroke/TIA will be minimized Outcome: Not Progressing   Problem: Coping: Goal: Will verbalize positive feelings about self Outcome: Not Progressing Goal: Will identify appropriate support needs Outcome: Not Progressing   Problem: Health Behavior/Discharge Planning: Goal: Ability to manage health-related needs will improve Outcome: Not Progressing Goal: Goals will be collaboratively established with patient/family Outcome: Not Progressing   Problem: Self-Care: Goal: Ability to participate in self-care as condition permits will improve Outcome: Not Progressing Goal: Verbalization of feelings and concerns over difficulty with self-care will improve Outcome: Not Progressing Goal: Ability to communicate needs accurately will improve Outcome: Not Progressing

## 2023-04-15 NOTE — Progress Notes (Signed)
PHARMACY CONSULT NOTE - FOLLOW UP  Pharmacy Consult for Electrolyte Monitoring and Replacement   Recent Labs: Potassium (mmol/L)  Date Value  04/15/2023 3.3 (L)   Magnesium (mg/dL)  Date Value  16/11/9602 2.4   Calcium (mg/dL)  Date Value  54/10/8117 8.7 (L)   Albumin (g/dL)  Date Value  14/78/2956 2.6 (L)  04/01/2021 4.2   Phosphorus (mg/dL)  Date Value  21/30/8657 5.6 (H)   Sodium (mmol/L)  Date Value  04/15/2023 149 (H)  10/05/2022 141   Assessment: DC is a 78 yo male who presented to [facility] ED post cardiac arrest. Their downtime was 2 hours. Patient just re-intubated due to hypoxia and found to have "moderate right-sided arterial clot burden as described above, with occlusive clot in the right upper lobe anterior and posterior segmental arteries, and nonoccluding thrombus in the right lower  lobe main artery and superior and posterior basal segmental arteries." Pharmacy has been consult to manage this patient's electrolytes while in the ICU.   Fluids: D5W @ 75 mL/hr Nutrition: NA   Pertinent Medications: NA   Goal of Therapy:  Electrolytes WNL (K > 4 and Mg > 2 due to cardiac arrest) Today, 04/15/2023 K = 3.3 Mg = NA Phos = 5.6 Na = 149  Plan:  Provider already ordered 40 mEq Kcl IV per tube No other replacement indicated at this time Continue to monitor with AM labs   Effie Shy, PharmD Pharmacy Resident  04/15/2023 6:22 AM

## 2023-04-15 NOTE — Progress Notes (Signed)
At 0255 another RN named [place] noted this patient to be bradycardiac and hypoxic on the monitor. That RN entered the room and found patient to be dusky in color and labored breathing and then unresponsive. That RN began to bag patient and provider entered the room. NP Britton-Lee intubated swiftly and patient regained appropriate heart rate and oxygenation with no CPR preformed. Started medications for blood pressure and sedation. Given verbal order to start at a higher start rate for sedation and ordered PRN pushes for sedation. Placed OG tube and clamped. Placed Fecal management system for large loss of bowels. Patient taken to CT for head and chest scan. CT of chest was positive for PE and Britton-Lee made aware. New orders placed. Family called and updated at bedside by Manuela Schwartz, NP. Bed in the lowest position, call bell within reach side rails up X2, mittens in place bilaterally, oral care provided, will continue to monitor.

## 2023-04-15 NOTE — Progress Notes (Signed)
ANTICOAGULATION CONSULT NOTE  Pharmacy Consult for heparin infusion Indication: pulmonary embolus  No Known Allergies  Patient Measurements: Height: 6' 0.01" (182.9 cm) Weight: 102.7 kg (226 lb 6.6 oz) IBW/kg (Calculated) : 77.62 Heparin Dosing Weight: 99.4 kg  Vital Signs: Temp: 98.6 F (37 C) (02/21 0400) Temp Source: Oral (02/21 0400) BP: 120/69 (02/21 0515) Pulse Rate: 95 (02/21 0515)  Labs: Recent Labs    04/13/23 0333 04/14/23 0432 04/15/23 0337 04/15/23 0350  HGB 9.3* 9.7* 9.8*  --   HCT 28.4* 30.0* 29.7*  --   PLT 154 194 226  --   CREATININE 1.42* 1.37*  --  1.48*  TROPONINIHS  --   --  74*  --     Estimated Creatinine Clearance: 52.6 mL/min (A) (by C-G formula based on SCr of 1.48 mg/dL (H)).   Medical History: Past Medical History:  Diagnosis Date   Alternating RBBB & LBBB    Ankle swelling 2024   Arthritis    neck   Bladder tumor    CAD (coronary artery disease)    a. 08/2017 STEMI/Cath: LM 80ost/d, LAD 100p, LCX 95 ost/p, RCA 60p, 33m, RPDA 80ost; b. 08/2017 CABG x 3: LIMA->LAD, VG->OM, VG->PDA.   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 08/2017 TEE: EF 30-35%, sept/ant/inf HK, apical AK. 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 12/2017 Echo: EF 45-50%, diff HK. Gr1 DD. Mildly reduced RV fxn. Mild RAE; c. 09/2022 Echo: EF 55-60%, mod LVH, GrI DD, nl RV fxn, mildly dil RA, mild MR.   Hyperlipidemia LDL goal <70    Ischemic cardiomyopathy    a. 08/2017 TEE: EF 30-35%; b. 12/2017 Echo: EF 45-50%; c. 09/2022 Echo: EF 55-60%.   Myocardial infarction (HCC) 08/27/2017   Shingles    2018 - right side   Vertigo    several months ago    Assessment: Pt is a 78 yo male presenting to admitted 2/11 following cardiac arrested now found with "moderate right-sided arterial clot burden... with occlusive clot in the right upper lobe anterior and posterior  segmental arteries, and nonoccluding thrombus in the right lower lobe main artery and superior and posterior basal segmental arteries."  Goal of Therapy:  Heparin level 0.3-0.7 units/ml Monitor platelets by anticoagulation protocol: Yes   Plan:  Pt previously on heparin drip but now on DVT Lovenox Lovenox discontinued, last dose 50 mg 2/20 @ 1326 Bolus 4000 units x 1 Start heparin infusion at last known therapeutic rate of 900 units/hr Will check HL in 8 hr after start of infusion CBC daily while on heparin  Otelia Sergeant, PharmD, [facility] 04/15/2023 5:26 AM

## 2023-04-15 NOTE — Progress Notes (Signed)
Pharmacy Antibiotic Note  Cory Hess is a 78 y.o. male admitted on 04/05/2023 following cardiac arrest, now with possible aspiration pneumonia.  Pharmacy has been consulted for Unasyn dosing.  Plan: Unasyn 3 gm q6hr for 5 days per indication & renal fxn.  Pharmacy will continue to follow and will adjust abx dosing whenever warranted.  Temp (24hrs), Avg:98.5 F (36.9 C), Min:98.1 F (36.7 C), Max:98.6 F (37 C)   Recent Labs  Lab 04/11/23 0406 04/12/23 0433 04/13/23 0333 04/14/23 0432 04/15/23 0337 04/15/23 0350  WBC 10.6* 7.2 6.6 7.0 8.3  --   CREATININE 1.50* 1.34* 1.42* 1.37*  --  1.48*  LATICACIDVEN  --   --   --   --  2.8*  --     Estimated Creatinine Clearance: 52.6 mL/min (A) (by C-G formula based on SCr of 1.48 mg/dL (H)).    No Known Allergies  Antimicrobials this admission: 2/21 Unasyn >> x 5 days  Microbiology results: No new lab cx currently ordered or pending at this time.  Thank you for allowing pharmacy to be a part of this patient's care.  Otelia Sergeant, PharmD, [facility] 04/15/2023 6:32 AM

## 2023-04-15 NOTE — Progress Notes (Addendum)
SLP Cancellation Note  Patient Details Name: Cory Hess MRN: [id number] DOB: 05-Jul-1945   Cancelled treatment:       Reason Eval/Treat Not Completed: Medical issues which prohibited therapy  Pt re-intubated early hours of this morning. Continues to be intubated. Please re-consult when pt is appropriate for services.   Adib Wahba 04/15/2023, 11:08 AM

## 2023-04-15 NOTE — Progress Notes (Signed)
Around 74, this RN noted pt to become bradycardic and hypoxic on the central monitor. RN entered room to assess pt and he was dusky in color with labored breathing. Pt quickly became unresponsive with agonal breathing; this RN began to assist ventilation with ambu bag and called for further assistance; additional RNs and RT to room. NP Cheryll Cockayne to bedside and intubated pt. NP Manuela Schwartz to bedside as well. Details of event relayed to providers and primary nurse.

## 2023-04-15 NOTE — Progress Notes (Signed)
ANTICOAGULATION CONSULT NOTE  Pharmacy Consult for heparin infusion Indication: pulmonary embolus  No Known Allergies  Patient Measurements: Height: 6' 0.01" (182.9 cm) Weight: 102.7 kg (226 lb 6.6 oz) IBW/kg (Calculated) : 77.62 Heparin Dosing Weight: 99.4 kg  Vital Signs: Temp: 97.7 F (36.5 C) (02/21 1545) Temp Source: Axillary (02/21 1545) BP: 123/57 (02/21 1300) Pulse Rate: 70 (02/21 1545)  Labs: Recent Labs    04/13/23 0333 04/14/23 0432 04/15/23 0337 04/15/23 0350 04/15/23 0634 04/15/23 1412 04/15/23 1517  HGB 9.3* 9.7* 9.8*  --   --   --   --   HCT 28.4* 30.0* 29.7*  --   --   --   --   PLT 154 194 226  --   --   --   --   HEPARINUNFRC  --   --   --   --   --  0.16*  --   CREATININE 1.42* 1.37*  --  1.48*  --   --   --   TROPONINIHS  --   --  74*  --  107*  --  85*    Estimated Creatinine Clearance: 52.6 mL/min (A) (by C-G formula based on SCr of 1.48 mg/dL (H)).   Medical History: Past Medical History:  Diagnosis Date   Alternating RBBB & LBBB    Ankle swelling 2024   Arthritis    neck   Bladder tumor    CAD (coronary artery disease)    a. 08/2017 STEMI/Cath: LM 80ost/d, LAD 100p, LCX 95 ost/p, RCA 60p, 52m, RPDA 80ost; b. 08/2017 CABG x 3: LIMA->LAD, VG->OM, VG->PDA.   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 08/2017 TEE: EF 30-35%, sept/ant/inf HK, apical AK. 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 12/2017 Echo: EF 45-50%, diff HK. Gr1 DD. Mildly reduced RV fxn. Mild RAE; c. 09/2022 Echo: EF 55-60%, mod LVH, GrI DD, nl RV fxn, mildly dil RA, mild MR.   Hyperlipidemia LDL goal <70    Ischemic cardiomyopathy    a. 08/2017 TEE: EF 30-35%; b. 12/2017 Echo: EF 45-50%; c. 09/2022 Echo: EF 55-60%.   Myocardial infarction (HCC) 08/27/2017   Shingles    2018 - right side   Vertigo    several months ago    Assessment: Pt is a 78 yo male presenting  to admitted 2/11 following cardiac arrested now found with "moderate right-sided arterial clot burden... with occlusive clot in the right upper lobe anterior and posterior segmental arteries, and nonoccluding thrombus in the right lower lobe main artery and superior and posterior basal segmental arteries."  Date/Time HL Rate  Comment 2/21 1412 0.16 900 units/hr Subtherapeutic   Goal of Therapy:  Heparin level 0.3-0.7 units/ml Monitor platelets by anticoagulation protocol: Yes   Plan:  Bolus 3000 units x 1 Increase heparin infusion rate to 1200 units/hr Will check HL in 8 hr after rate change CBC daily while on heparin  Paulita Fujita, PharmD Clinical Pharmacist  04/15/2023 3:55 PM

## 2023-04-15 NOTE — Progress Notes (Addendum)
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  He presented to [facility] ER via EMS from [facility] following a witnessed cardiac arrest.  Bystander CPR initiated and EMS arrived on the scene within 30 seconds cardiac rhythm asystole.  ROSC initially achieved, however pt in and out of PEA/asystole.    Pt recently evaluated in the ER on 02/4 with urinary urgency and frequency that started 5 days prior to that presentation.  Diagnosed with a UTI and prescribed Bactrim DS twice daily for 10 days.  However, symptoms persisted prompting [facility] visit today.  ED Course  Upon arrival to the ER pt on NRB with pulse.  It was reported during chest compressions the pt began to fight and pulling at medical devices.  While in the the ER waiting room the pt cardiac arrested again requiring ACLS protocol and mechanical intubation.  Pt with varied cardiac rhythm during cardiac arrest PEA/asystole/complete heart block.  He required transcutaneously pacing.  EKG showed apparent sinus rhythm with complete heart block and ventricular escape rhythm with LBBB morphology.  Total downtime estimated 2hrs from out of hospital cardiac arrest and inpatient cardiac arrest.  Pt required epinephrine gtt due to bradycardia and hypotension.  Pt taken emergently to the cardiac cath lab for temporary transvenous pacemaker placement, emergent cardiac catheterization, and swan-ganz catheter placement.   Cardiac cath revealed severe native CAD with patent LIMA-LAD and SVG-OM. SVG-RCA is occluded, but did not appear  acute and unlikely the inciting factor for today's cardiac arrest.  Per cardiology if pt makes a meaningful recovery he may require revascularization of the RCA.  Pt admitted to the ICU post cardiac cath PCCM consulted to asume care.    Hospital course: See significant events for more detail Overnight on 2/20-2/21 patient acutely decompensated with sudden bradycardia- pause and hypoxia into the 60's. Patient emergently intubated and place on mechanical ventilatory support with resolution of baseline rhythm. CT angio post intubation revealed moderate Right Upper & lower PE without heart strain.  Most recent Significant labs: (Labs/ Imaging personally reviewed)  Chemistry: Na+:149, K+: 3.3, BUN/Cr.: 49/ 1.48, Serum CO2/ AG: 21/11 Hematology: WBC: 8.3, Hgb: 9.8,  Troponin: 74, BNP: 498.8, Lactic: 2.8  ABG: 7.38/ 40/175/23.7 CXR 04/15/23: Patchy LLL retrocardiac opacity, suspicious for pneumonia CT head wo contrast 04/15/23: Small right frontal lobe cortical infarcts this month on MRI are occult by CT. No new intracranial abnormality identified. Ct angio chest 04/15/23:  Moderate right-sided arterial clot burden as described above, with occlusive clot in the right upper lobe anterior and posterior segmental arteries, and nonoccluding thrombus in the right lower lobe main artery and superior and posterior basal segmental arteries. 2. No increase in the RV/LV ratio, but there is mild contrast reflux into the IVC, and the right main pulmonary artery is more prominent than previously consistent with arterial congestion. Cardiomegaly with old sternotomy and CABG changes. 4. Small pleural effusions with interstitial and ground-glass edema in the upper lobes. 5. More confluent and dense consolidation in the lower lobes posteriorly, which could be due to consolidated alveolar edema, pneumonia or aspiration.  6. Aortic and coronary artery atherosclerosis. 7. Hepatic steatosis. 8. Cholelithiasis. 9. Nonobstructing  left renal stone. 10. Slightly displaced recent fractures of the left anterior second through sixth and right anterior fourth through seventh ribs, almost certainly CPR related. No pneumothorax. No mediastinal hematoma.  PCCM re-consulted due to emergent acute hypoxic respiratory failure secondary to moderate right upper & lower PE requiring emergent intubation and mechanical ventilatory support.  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  Worsening AKI and mild hyperkalemia, given shifting measures.  Low threshold for Nephrology consultation. 02/13: Weaning down vasopressors, Vasopressin off, Levo @ 7, Epi @ 6.  On minimal vent support.  Renal function and hyperkalemia slowly improving.  Advanced Heart Failure diuresing with 40 mg IV Lasix x1 dose for CVP of 15.  Perform WUA. 02/14: Weaned off vasopressors  On minimal vent support, failed WUA due to        severe hypertension and increased WOB.  On WUA only opening eyes, not        following commands.  MRI Brain revealed 2 small acute infarctions affecting the        cortical brain in theright frontal region.Theone Murdoch, temporary pacer and right        femoral venous sheath removed.  AKI slowly improving.  Heparin gtt discontinued        given low suspicion for PE.  To have PICC placed per Heart Failure team request. 02/15: Pt remains mechanically intubated.  All sedation off  brainstem reflexes intact and opens eyes to voice.  Mentation precluding extubation.  Neurology consulted 02/16: Overnight pt developed worsening acute respiratory failure with accessory muscle use, tachycardia, and hypoxia requiring bagging via BVM, therefore propofol gtt restarted.  Neurology recommending MRA Head/Neck and EEG once stable to transfer.  Performing WUA and possible SBT today  2/17: No significant events overnight, mental status still lagging. stroke work up pending per neuro recs 2/18: No significant events overnight. EEG without seizures, CTH negative, and Neck as below. Neuro following for neuro prognostication 2/19: following commands, extubated 2/20: patient confused and agitated, no SOB on 4 L [place]. Overnight patient became bradycardic with pauses, while maintaining a pulse and hypoxic in the 60's. Emergently intubated and placed back on mechanical ventilatory support with return to normal rhythm.  Interim History / Subjective:  Patient unresponsive, grey with bradycardia/ hypotension and hypoxia. Family updated.  Objective   Blood pressure (!) 149/70, pulse (!) 108, temperature 98.6 F (37 C), temperature source Oral, resp. rate 20, height 6' (1.829 m), weight 102.6 kg, SpO2 97%.        Intake/Output Summary (Last 24 hours) at 04/15/2023 0348 Last data filed at 04/15/2023 0200 Gross per 24 hour  Intake 1250.61 ml  Output 1876 ml  Net -625.39 ml   Filed Weights   04/12/23 0500 04/13/23 0500 04/14/23 0436  Weight: 108.3 kg 104.3 kg 102.6 kg   Examination: General: Adult male, critically ill, lying in bed intubated & sedated requiring mechanical ventilation, NAD HEENT: MM  pink/moist, anicteric, atraumatic, neck supple Neuro: RASS; -4, unable to follow commands, PERRL +3 CV: s1s2 RRR, bradycardic on monitor, no r/m/g Pulm: Regular, non labored on PRVC 100% & PEEP 5, breath sounds coarse-BUL & diminished-BLL GI: soft, rounded, bs x 4 GU: foley in place with clear  yellow urine Skin: blister on RUE and abrasions on chest from CPR Extremities: warm/dry, pulses + 2 R/P, no edema noted  Resolved Hospital Problem list    Assessment & Plan:  #Acute metabolic encephalopathy  #Mechanical intubation pain/discomfort  MR Brain 02/14: 2 small acute infarctions affecting the cortical brain in the right frontal region.  Mild chronic small-vessel ischemic change of the cerebral hemispheric white matter with a few old small cortical infarctions scattered about the right hemisphere suggesting chronic/recurrent micro embolic infarctions. Recommend carotid evaluation. US carotid showing < 50% stenosis. MRA Head/Neck with small infarcts, and EEG without seizures. Repeat CTH no new acute intracranial abnormality - Neurology consulted appreciate input  - Stroke work up per Neuro recs - Continue ASA 81mg  daily + plavix 75mg  daily x21 days f/b ASA 81mg  daily monotherapy after that  - Continue atorvastatin 80mg  daily   #Moderate Right upper & Lower Pulmonary embolism  - Systemic Heparin drip per pharmacy protocol - Follow up BLE dopplers to assess for DVT - Consider hypercoagulable panel (Factor V leiden, lupus anticoagulant, homocysteine, antithrombin [name], protein C, protein S) - Echocardiogram ordered  - consider Vascular consult if IVC filter/ thrombectomy is warranted  #Cardiac arrest  #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 SVG-RCA is occluded, but did not appear acute and unlikely the inciting factor for today's cardiac arrest Echocardiogram 04/07/23:  LVEF 40-45%, mild LVH, Grade I DD, RV systolic function not well visualized, RV size normal - continuous cardiac monitoring - consider vasopressor support to maintain MAP > 65 - trend troponin, co-ox panel, lactic -  diuresis with lasix as hemodynamics and renal function allow - cardiology following, appreciate input  #Acute hypoxic hypercapnic respiratory failure  #Aspiration pneumonia secondary to acute encephalopathy post cardiac arrest  #Mechanical intubation  - Ventilator settings: PRVC  8 mL/kg, 100% FiO2, 5 PEEP, continue ventilator support & lung protective strategies - Wean PEEP & FiO2 as tolerated, maintain SpO2 > 90% - Head of bed elevated 30 degrees, VAP protocol in place - Plateau pressures less than 30 cm H20  - Intermittent chest x-ray & ABG PRN - Daily WUA with SBT as tolerated  - Ensure adequate pulmonary hygiene  - F/u cultures, trend PCT - restart Aspiration Pna coverage: unasyn - bronchodilators PRN - PAD protocol in place: continue Fentanyl IVP & Propofol drip  #Acute kidney injury secondary to ATN~improving  #Hypokalemia  # Hypernatremia - Strict I/O's: alert provider if UOP < 0.5 mL/kg/hr - Daily BMP, replace electrolytes PRN - Avoid nephrotoxic agents as able, ensure adequate renal perfusion - continue D5 @ 75 mL/h, consider changing back to free water flush when OGT placed  #Hyperglycemia  - Monitor CBG Q 4 hours - SSI sensitive dosing - target range while in ICU: 140-180 - follow ICU hyper/hypo-glycemia protocol  Best Practice (right click and "Reselect all SmartList Selections" daily)  Diet/type: NPO DVT prophylaxis LMWH Pressure ulcer(s): N/A GI prophylaxis: H2B Lines: Portacath present on admission and Left Upper Extremity PICC and still needed Foley:  Yes, and it is  still needed Code Status:  DNR with pre-interventions Last date of multidisciplinary goals of care discussion [04/14/23]  2/21: family updated about change in clinical status and emergent intubation requiring mechanical ventilator support. Labs   CBC: Recent Labs  Lab 04/10/23 0421 04/11/23 0406 04/12/23 0433 04/13/23 0333 04/14/23 0432  WBC 9.6 10.6* 7.2 6.6 7.0  HGB 8.8* 8.8* 8.4*  9.3* 9.7*  HCT 25.6* 27.0* 26.2* 28.4* 30.0*  MCV 99.2 103.4* 104.4* 102.5* 102.0*  PLT 121* 127* 117* 154 194    Basic Metabolic Panel: Recent Labs  Lab 04/10/23 0418 04/10/23 0421 04/10/23 1502 04/11/23 0406 04/12/23 0433 04/12/23 2008 04/13/23 0333 04/14/23 0432  NA 146*  --   --  148* 145  --  146* 150*  K 2.6*  --    < > 3.2* 3.2* 3.8 3.9 3.3*  CL 109  --   --  114* 112*  --  114* 115*  CO2 26  --   --  24 24  --  23 24  GLUCOSE 117*  --   --  132* 106*  --  131* 100*  BUN 59*  --   --  51* 51*  --  60* 52*  CREATININE 1.72*  --   --  1.50* 1.34*  --  1.42* 1.37*  CALCIUM 8.2*  --   --  8.4* 8.4*  --  8.7* 8.6*  MG  --  2.0  --  2.2 2.2  --  2.2 2.2  PHOS  --  2.9  --  2.8 3.6  --  3.5 3.1   < > = values in this interval not displayed.   GFR: Estimated Creatinine Clearance: 56.8 mL/min (A) (by C-G formula based on SCr of 1.37 mg/dL (H)). Recent Labs  Lab 04/09/23 1453 04/10/23 0421 04/11/23 0406 04/12/23 0433 04/13/23 0333 04/14/23 0432  PROCALCITON 1.70  --   --   --   --   --   WBC  --    < > 10.6* 7.2 6.6 7.0   < > = values in this interval not displayed.    Liver Function Tests: Recent Labs  Lab 04/09/23 1453 04/13/23 0333 04/14/23 0432  AST 37  --   --   ALT 64*  --   --   ALKPHOS 73  --   --   BILITOT 0.9  --   --   PROT 5.3*  --   --   ALBUMIN 2.4* 2.6* 2.8*   No results for input(s): "LIPASE", "AMYLASE" in the last 168 hours. No results for input(s): "AMMONIA" in the last 168 hours.  ABG    Component Value Date/Time   PHART 7.45 04/12/2023 1156   PCO2ART 32 04/12/2023 1156   PO2ART 66 (L) 04/12/2023 1156   HCO3 22.2 04/12/2023 1156   TCO2 33 (H) 04/05/2023 1459   ACIDBASEDEF 1.0 04/12/2023 1156   O2SAT 78 04/13/2023 0603     Coagulation Profile: No results for input(s): "INR", "PROTIME" in the last 168 hours.   Cardiac Enzymes: No results for input(s): "CKTOTAL", "CKMB", "CKMBINDEX", "TROPONINI" in the last 168  hours.  HbA1C: Hgb A1c MFr Bld  Date/Time Value Ref Range Status  04/05/2023 07:38 PM 5.9 (H) 4.8 - 5.6 % Final    Comment:    (NOTE) Pre diabetes:          5.7%-6.4%  Diabetes:              >6.4%  Glycemic control for   <  7.0% adults with diabetes   08/28/2017 02:27 AM 5.6 4.8 - 5.6 % Final    Comment:    (NOTE) Pre diabetes:          5.7%-6.4% Diabetes:              >6.4% Glycemic control for   <7.0% adults with diabetes     CBG: Recent Labs  Lab 04/14/23 1134 04/14/23 1619 04/14/23 1940 04/14/23 2345 04/15/23 0329  GLUCAP 108* 105* 105* 91 174*    Review of Systems:   Unable to assess pt mechanically intubated   Past Medical History:  He,  has a past medical history of Alternating RBBB & LBBB, Ankle swelling (2024), Arthritis, Bladder tumor, CAD (coronary artery disease), Cancer (HCC), Cancer of base of tongue (HCC), Essential hypertension, 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   Surgical History:   Past Surgical History:  Procedure Laterality Date   CARDIAC CATHETERIZATION     CATARACT EXTRACTION W/PHACO Left 07/18/2018   Procedure: CATARACT EXTRACTION PHACO AND INTRAOCULAR LENS PLACEMENT (IOC)  LEFT;  Surgeon: Nevada Crane, MD;  Location: [facility];  Service: Ophthalmology;  Laterality: Left;   CATARACT EXTRACTION W/PHACO Right 08/14/2018   Procedure: CATARACT EXTRACTION PHACO AND INTRAOCULAR LENS PLACEMENT (IOC)  RIGHT;  Surgeon: Nevada Crane, MD;  Location: [facility];  Service: Ophthalmology;  Laterality: Right;   CORONARY ARTERY BYPASS GRAFT N/A 08/27/2017   Procedure: CORONARY ARTERY BYPASS GRAFTING (CABG) ON PUMP USING LEFT INTERNAL MAMMARY ARTERY AND LEFT GREATER SAPHENOUS VEIN VIA ENDOVEIN HARVEST;  Surgeon: Alleen Borne, MD;  Location: [facility] OR;  Service: Open Heart Surgery;   Laterality: N/A;   CORONARY/GRAFT ACUTE MI REVASCULARIZATION N/A 08/27/2017   Procedure: Coronary/Graft Acute MI Revascularization;  Surgeon: Iran Ouch, MD;  Location: [facility];  Service: Cardiovascular;  Laterality: N/A;   CYSTOSCOPY W/ RETROGRADES Bilateral 12/25/2018   Procedure: CYSTOSCOPY WITH RETROGRADE PYELOGRAM;  Surgeon: Vanna Scotland, MD;  Location: [facility];  Service: Urology;  Laterality: Bilateral;   FRACTURE SURGERY Right 1958   arm and left wrist compound fracture, no metal   HERNIA REPAIR Right    inguinial   IR IMAGING GUIDED PORT INSERTION  10/15/2022   LEFT HEART CATH AND CORONARY ANGIOGRAPHY N/A 08/27/2017   Procedure: LEFT HEART CATH AND CORONARY ANGIOGRAPHY;  Surgeon: Iran Ouch, MD;  Location: [facility];  Service: Cardiovascular;  Laterality: N/A;   RIGHT/LEFT HEART CATH AND CORONARY/GRAFT ANGIOGRAPHY N/A 04/05/2023   Procedure: RIGHT/LEFT HEART CATH AND CORONARY/GRAFT ANGIOGRAPHY;  Surgeon: Yvonne Kendall, MD;  Location: [facility];  Service: Cardiovascular;  Laterality: N/A;   TEMPORARY PACEMAKER N/A 04/05/2023   Procedure: TEMPORARY PACEMAKER;  Surgeon: Yvonne Kendall, MD;  Location: [facility];  Service: Cardiovascular;  Laterality: N/A;   TRANSURETHRAL RESECTION OF BLADDER TUMOR WITH MITOMYCIN-C N/A 12/25/2018   Procedure: TRANSURETHRAL RESECTION OF BLADDER TUMOR WITH Gemcitabine;  Surgeon: Vanna Scotland, MD;  Location: [facility];  Service: Urology;  Laterality: N/A;     Social History:   reports that he quit smoking about 25 years ago. His smoking use included cigarettes. His smokeless tobacco use includes snuff. He reports that he does not currently use alcohol. He reports that he does not currently use drugs.   Family History:  His family history includes Cervical cancer in his maternal grandmother; Heart failure in his mother;  Lung disease in his father; Stroke in his father.   Allergies No Known  Allergies   Home Medications  Prior to Admission medications   Medication Sig Start Date End Date Taking? Authorizing Provider  acetaminophen (TYLENOL) 650 MG CR tablet Take 650 mg by mouth every 8 (eight) hours as needed for pain.    [provider]  aspirin EC 81 MG EC tablet Take 1 tablet (81 mg total) by mouth daily. 09/03/17   Gold, Glenice Laine, PA-C  atorvastatin (LIPITOR) 80 MG tablet Take 1 tablet by mouth once daily 02/08/23   Creig Hines, NP  carvedilol (COREG) 12.5 MG tablet TAKE 1 TABLET BY MOUTH TWICE DAILY WITH A MEAL 03/10/23   End, Cristal Deer, MD  lidocaine-prilocaine (EMLA) cream Apply on the port. 30 -45 min  prior to port access. 10/08/22   Earna Coder, MD  losartan (COZAAR) 100 MG tablet Take 1 tablet by mouth once daily 03/10/23   End, Cristal Deer, MD  magic mouthwash (multi-ingredient) oral suspension Swish and spit 5-10 mLs 4 (four) times daily as needed. 11/24/22   Earna Coder, MD  Multiple Vitamin (MULTIVITAMIN WITH MINERALS) TABS tablet Take 1 tablet by mouth daily. One a Day over 50/ lunch    [provider]  nitroGLYCERIN (NITROSTAT) 0.4 MG SL tablet DISSOLVE ONE TABLET UNDER THE TONGUE EVERY 5 MINUTES AS NEEDED FOR CHEST PAIN.  DO NOT EXCEED A TOTAL OF 3 DOSES IN 15 MINUTES 03/24/23   Creig Hines, NP  OLANZapine (ZYPREXA) 5 MG tablet Take one pill at night. 11/03/22   Earna Coder, MD  ondansetron (ZOFRAN) 8 MG tablet One pill every 8 hours as needed for nausea/vomitting. 10/08/22   Earna Coder, MD  polyethylene glycol (MIRALAX) 17 g packet Take 17 g by mouth daily. 11/07/22   Romeo Apple, Myah A, PA-C  prochlorperazine (COMPAZINE) 10 MG tablet Take 1 tablet (10 mg total) by mouth every 6 (six) hours as needed for nausea or vomiting. 10/08/22   Earna Coder, MD  senna-docusate (SENOKOT-S) 8.6-50 MG tablet Take 1 tablet by mouth daily. 11/07/22   Romeo Apple, Myah A, PA-C  sucralfate (CARAFATE) 1  g tablet Take 1 tablet (1 g total) by mouth 4 (four) times daily -  with meals and at bedtime. Dissolve in 4 tablespoons warm water 30 minutes before meals. 10/26/22   Carmina Miller, MD  sulfamethoxazole-trimethoprim (BACTRIM DS) 800-160 MG tablet Take 1 tablet by mouth 2 (two) times daily for 10 days. 03/29/23 04/08/23  Becky Augusta, NP  torsemide (DEMADEX) 20 MG tablet Take 1 tablet (20 mg total) by mouth daily as needed (swelling and weight gain). 11/25/22 02/23/23  Creig Hines, NP  traMADol HCl 100 MG TABS Take 100 mg by mouth every 8 (eight) hours as needed. 10/26/22   Earna Coder, MD  Scheduled Meds:  aspirin  81 mg Per Tube Daily   atorvastatin  80 mg Per Tube Daily   Chlorhexidine Gluconate Cloth  6 each Topical QHS   clopidogrel  75 mg Per Tube Daily   docusate  100 mg Per Tube BID   enoxaparin (LOVENOX) injection  50 mg Subcutaneous Q24H   insulin aspart  0-6 Units Subcutaneous Q4H   lidocaine  1 patch Transdermal Q24H   midazolam  2 mg Intravenous Once   mouth rinse  15 mL Mouth Rinse 4 times per day   mouth rinse  15 mL Mouth Rinse Q2H   polyethylene glycol  17  g Per Tube Daily   sodium chloride flush  10-40 mL Intracatheter Q12H   sodium chloride flush  10-40 mL Intracatheter Q12H   Continuous Infusions:  sodium chloride 10 mL/hr at 04/15/23 0200   acetaminophen Stopped (04/14/23 2129)   dextrose 75 mL/hr at 04/15/23 0200   famotidine (PEPCID) IV Stopped (04/14/23 2048)   norepinephrine Stopped (04/15/23 0327)   propofol (DIPRIVAN) infusion 30 mcg/kg/min (04/15/23 0335)   PRN Meds:.sodium chloride, acetaminophen, fentaNYL (SUBLIMAZE) injection, fentaNYL (SUBLIMAZE) injection, haloperidol lactate, hydrALAZINE, ipratropium-albuterol, midazolam, norepinephrine, ondansetron (ZOFRAN) IV, mouth rinse, mouth rinse, sodium chloride flush, sodium chloride flush   Critical care time: 65 minutes      Betsey Holiday, AGACNP-BC Acute Care Nurse  Practitioner [facility]   [phone] / [phone] Please see Amion for details.    ICU ATTENDING ATTESTATION:  Patient seen and examined and relevant ancillary tests reviewed.   I agree with the assessment and plan of care as outlined by Cheryll Cockayne NP.     REVIEW OF SYSTEMS  PATIENT IS UNABLE TO PROVIDE COMPLETE REVIEW OF SYSTEMS DUE TO SEVERE CRITICAL ILLNESS   PHYSICAL EXAMINATION:  GENERAL:critically ill appearing, +resp distress EYES: Pupils equal, round, reactive to light.  No scleral icterus.  MOUTH: Moist mucosal membrane. INTUBATED NECK: Supple.  PULMONARY: Lungs clear to auscultation, +rhonchi, +wheezing CARDIOVASCULAR: S1 and S2.  Regular rate and rhythm GASTROINTESTINAL: Soft, nontender, -distended. Positive bowel sounds.  MUSCULOSKELETAL: edema.  NEUROLOGIC: sedated SKIN:normal, warm to touch, Capillary refill delayed  Pulses present bilaterally   BP 123/67   Pulse 79   Temp 97.9 F (36.6 C) (Axillary)   Resp (!) 22   Ht 6' 0.01" (1.829 m)   Wt 102.7 kg   SpO2 100%   BMI 30.70 kg/m   CBC    Component Value Date/Time   WBC 8.3 04/15/2023 0337   RBC 2.96 (L) 04/15/2023 0337   HGB 9.8 (L) 04/15/2023 0337   HGB 10.3 (L) 01/14/2023 0927   HGB 14.6 04/01/2021 0933   HCT 29.7 (L) 04/15/2023 0337   HCT 43.4 04/01/2021 0933   PLT 226 04/15/2023 0337   PLT 115 (L) 01/14/2023 0927   PLT 151 04/01/2021 0933   MCV 100.3 (H) 04/15/2023 0337   MCV 92 04/01/2021 0933   MCH 33.1 04/15/2023 0337   MCHC 33.0 04/15/2023 0337   RDW 13.1 04/15/2023 0337   RDW 12.3 04/01/2021 0933   LYMPHSABS 1.4 04/05/2023 1743   MONOABS 1.7 (H) 04/05/2023 1743   EOSABS 0.1 04/05/2023 1743   BASOSABS 0.1 04/05/2023 1743        Latest Ref Rng & Units 04/15/2023    3:50 AM 04/14/2023    4:32 AM 04/13/2023    3:33 AM  BMP  Glucose 70 - 99 mg/dL 295  621  308   BUN 8 - 23 mg/dL 49  52  60   Creatinine 0.61 - 1.24 mg/dL 6.57  8.46  9.62   Sodium  135 - 145 mmol/L 149  150  146   Potassium 3.5 - 5.1 mmol/L 3.3  3.3  3.9   Chloride 98 - 111 mmol/L 117  115  114   CO2 22 - 32 mmol/L 21  24  23    Calcium 8.9 - 10.3 mg/dL 8.7  8.6  8.7          Critical Care Time devoted to patient care services described in this note is 65 Total Care Time minutes.  Overall, patient is critically ill, prognosis is guarded.  Patient with Multiorgan failure and at high risk for cardiac arrest and death.    Lucie Leather, M.D.  [facility]  Medical Director [facility] Medical Director [facility] Cardio-Pulmonary Department

## 2023-04-16 ENCOUNTER — Inpatient Hospital Stay: Payer: Medicare Other

## 2023-04-16 ENCOUNTER — Inpatient Hospital Stay (HOSPITAL_COMMUNITY)
Admit: 2023-04-16 | Discharge: 2023-04-16 | Disposition: A | Discharge status: Home / Self Care | Payer: Medicare Other | Attending: Pulmonary Disease

## 2023-04-16 DIAGNOSIS — I2609 Other pulmonary embolism with acute cor pulmonale: Secondary | ICD-10-CM | POA: Diagnosis not present

## 2023-04-16 DIAGNOSIS — I2699 Other pulmonary embolism without acute cor pulmonale: Secondary | ICD-10-CM | POA: Diagnosis not present

## 2023-04-16 DIAGNOSIS — G9341 Metabolic encephalopathy: Secondary | ICD-10-CM | POA: Diagnosis not present

## 2023-04-16 DIAGNOSIS — I469 Cardiac arrest, cause unspecified: Secondary | ICD-10-CM | POA: Diagnosis not present

## 2023-04-16 DIAGNOSIS — J9601 Acute respiratory failure with hypoxia: Secondary | ICD-10-CM | POA: Diagnosis not present

## 2023-04-16 LAB — GLUCOSE, CAPILLARY
Glucose-Capillary: 100 mg/dL — ABNORMAL HIGH (ref 70–99)
Glucose-Capillary: 107 mg/dL — ABNORMAL HIGH (ref 70–99)
Glucose-Capillary: 112 mg/dL — ABNORMAL HIGH (ref 70–99)
Glucose-Capillary: 75 mg/dL (ref 70–99)
Glucose-Capillary: 82 mg/dL (ref 70–99)
Glucose-Capillary: 90 mg/dL (ref 70–99)

## 2023-04-16 LAB — RENAL FUNCTION PANEL
Albumin: 2.5 g/dL — ABNORMAL LOW (ref 3.5–5.0)
Anion gap: 8 (ref 5–15)
BUN: 41 mg/dL — ABNORMAL HIGH (ref 8–23)
CO2: 22 mmol/L (ref 22–32)
Calcium: 8.3 mg/dL — ABNORMAL LOW (ref 8.9–10.3)
Chloride: 114 mmol/L — ABNORMAL HIGH (ref 98–111)
Creatinine, Ser: 1.37 mg/dL — ABNORMAL HIGH (ref 0.61–1.24)
GFR, Estimated: 53 mL/min — ABNORMAL LOW (ref >60)
Glucose, Bld: 99 mg/dL (ref 70–99)
Phosphorus: 4 mg/dL (ref 2.5–4.6)
Potassium: 3.6 mmol/L (ref 3.5–5.1)
Sodium: 144 mmol/L (ref 135–145)

## 2023-04-16 LAB — CBC
HCT: 25.9 % — ABNORMAL LOW (ref 39.0–52.0)
Hemoglobin: 8.6 g/dL — ABNORMAL LOW (ref 13.0–17.0)
MCH: 33.2 pg (ref 26.0–34.0)
MCHC: 33.2 g/dL (ref 30.0–36.0)
MCV: 100 fL (ref 80.0–100.0)
Platelets: 206 10*3/uL (ref 150–400)
RBC: 2.59 MIL/uL — ABNORMAL LOW (ref 4.22–5.81)
RDW: 12.8 % (ref 11.5–15.5)
WBC: 13.2 10*3/uL — ABNORMAL HIGH (ref 4.0–10.5)
nRBC: 0 % (ref 0.0–0.2)

## 2023-04-16 LAB — ECHOCARDIOGRAM LIMITED
Calc EF: 56.9 %
Height: 72.008 in
S' Lateral: 2.6 cm
Single Plane A2C EF: 52.7 %
Single Plane A4C EF: 62.8 %
Weight: 3675.51 [oz_av]

## 2023-04-16 LAB — HEPARIN LEVEL (UNFRACTIONATED)
Heparin Unfractionated: 0.32 [IU]/mL (ref 0.30–0.70)
Heparin Unfractionated: 0.26 [IU]/mL — ABNORMAL LOW (ref 0.30–0.70)
Heparin Unfractionated: 0.41 [IU]/mL (ref 0.30–0.70)

## 2023-04-16 LAB — TRIGLYCERIDES: Triglycerides: 120 mg/dL (ref <150)

## 2023-04-16 LAB — MAGNESIUM: Magnesium: 2.2 mg/dL (ref 1.7–2.4)

## 2023-04-16 MED ORDER — PERFLUTREN LIPID MICROSPHERE
1.0000 mL | INTRAVENOUS | Status: AC | PRN
  Administered 2023-04-16: 4 mL via INTRAVENOUS

## 2023-04-16 MED ORDER — HEPARIN BOLUS VIA INFUSION
1500.0000 [IU] | Freq: Once | INTRAVENOUS | Status: AC
  Administered 2023-04-16: 1500 [IU] via INTRAVENOUS
  Filled 2023-04-16: qty 1500

## 2023-04-16 MED ORDER — FENTANYL CITRATE PF 50 MCG/ML IJ SOSY: 25.0000 ug | PREFILLED_SYRINGE | Freq: Once | INTRAMUSCULAR | Status: DC

## 2023-04-16 MED ORDER — DOCUSATE SODIUM 50 MG/5ML PO LIQD
100.0000 mg | Freq: Two times a day (BID) | ORAL | Status: DC
  Administered 2023-04-16 – 2023-04-17 (×2): 100 mg
  Filled 2023-04-16 (×3): qty 10

## 2023-04-16 MED ORDER — FENTANYL 2500MCG IN NS 250ML (10MCG/ML) PREMIX INFUSION
25.0000 ug/h | INTRAVENOUS | Status: DC
  Administered 2023-04-16: 25 ug/h via INTRAVENOUS
  Administered 2023-04-17: 100 ug/h via INTRAVENOUS
  Administered 2023-04-18: 125 ug/h via INTRAVENOUS
  Administered 2023-04-18: 100 ug/h via INTRAVENOUS
  Administered 2023-04-20: 75 ug/h via INTRAVENOUS
  Filled 2023-04-16 (×5): qty 250

## 2023-04-16 MED ORDER — POLYETHYLENE GLYCOL 3350 17 G PO PACK
17.0000 g | PACK | Freq: Every day | ORAL | Status: DC
  Administered 2023-04-17: 17 g
  Filled 2023-04-16 (×2): qty 1

## 2023-04-16 MED ORDER — LACTATED RINGERS IV BOLUS
500.0000 mL | Freq: Once | INTRAVENOUS | Status: AC
  Administered 2023-04-16: 500 mL via INTRAVENOUS

## 2023-04-16 MED ORDER — FENTANYL BOLUS VIA INFUSION
25.0000 ug | INTRAVENOUS | Status: DC | PRN
Start: 2023-04-16 — End: 2023-04-21
  Administered 2023-04-16: 100 ug via INTRAVENOUS
  Administered 2023-04-16: 50 ug via INTRAVENOUS
  Administered 2023-04-16: 100 ug via INTRAVENOUS
  Administered 2023-04-16 (×3): 50 ug via INTRAVENOUS
  Administered 2023-04-17 (×2): 100 ug via INTRAVENOUS
  Administered 2023-04-17: 50 ug via INTRAVENOUS
  Administered 2023-04-17 – 2023-04-18 (×3): 100 ug via INTRAVENOUS
  Administered 2023-04-18: 50 ug via INTRAVENOUS
  Administered 2023-04-18: 100 ug via INTRAVENOUS
  Administered 2023-04-19 – 2023-04-21 (×3): 50 ug via INTRAVENOUS

## 2023-04-16 NOTE — Progress Notes (Signed)
 PHARMACY CONSULT NOTE - FOLLOW UP  Pharmacy Consult for Electrolyte Monitoring and Replacement   Recent Labs: Potassium (mmol/L)  Date Value  04/16/2023 3.6   Magnesium (mg/dL)  Date Value  81/19/1478 2.2   Calcium (mg/dL)  Date Value  29/56/2130 8.3 (L)   Albumin (g/dL)  Date Value  86/57/8469 2.5 (L)  04/01/2021 4.2   Phosphorus (mg/dL)  Date Value  62/95/2841 4.0   Sodium (mmol/L)  Date Value  04/16/2023 144  10/05/2022 141   Assessment: DC is a 78 yo male who presented to [facility] ED post cardiac arrest. Their downtime was 2 hours. Patient just re-intubated due to hypoxia and found to have "moderate right-sided arterial clot burden as described above, with occlusive clot in the right upper lobe anterior and posterior segmental arteries, and nonoccluding thrombus in the right lower  lobe main artery and superior and posterior basal segmental arteries." Pharmacy has been consult to manage this patient's electrolytes while in the ICU.   Fluids: free water 200 ml q4H  Nutrition: Prosource 60 ml daily + vital high protein 60 ml/hr.  Pertinent Medications: NA   Goal of Therapy:  Electrolytes WNL (K > 4 and Mg > 2 due to cardiac arrest)   Plan:  Kcl 40 mEq x 1 F/u with AM labs.   Ronnald Ramp, PharmD 04/16/2023 8:11 AM

## 2023-04-16 NOTE — Progress Notes (Signed)
 ANTICOAGULATION CONSULT NOTE  Pharmacy Consult for heparin infusion Indication: pulmonary embolus  No Known Allergies  Patient Measurements: Height: 6' 0.01" (182.9 cm) Weight: 102.7 kg (226 lb 6.6 oz) IBW/kg (Calculated) : 77.62 Heparin Dosing Weight: 99.4 kg  Vital Signs: Temp: 98.6 F (37 C) (02/22 0000) Temp Source: Oral (02/22 0000) BP: 136/51 (02/22 0100) Pulse Rate: 87 (02/22 0100)  Labs: Recent Labs    04/13/23 0333 04/14/23 0432 04/15/23 0337 04/15/23 0350 04/15/23 0634 04/15/23 1412 04/15/23 1517 04/16/23 0106  HGB 9.3* 9.7* 9.8*  --   --   --   --   --   HCT 28.4* 30.0* 29.7*  --   --   --   --   --   PLT 154 194 226  --   --   --   --   --   HEPARINUNFRC  --   --   --   --   --  0.16*  --  0.32  CREATININE 1.42* 1.37*  --  1.48*  --   --   --   --   TROPONINIHS  --   --  74*  --  107*  --  85*  --     Estimated Creatinine Clearance: 52.6 mL/min (A) (by C-G formula based on SCr of 1.48 mg/dL (H)).   Medical History: Past Medical History:  Diagnosis Date   Alternating RBBB & LBBB    Ankle swelling 2024   Arthritis    neck   Bladder tumor    CAD (coronary artery disease)    a. 08/2017 STEMI/Cath: LM 80ost/d, LAD 100p, LCX 95 ost/p, RCA 60p, 32m, RPDA 80ost; b. 08/2017 CABG x 3: LIMA->LAD, VG->OM, VG->PDA.   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 08/2017 TEE: EF 30-35%, sept/ant/inf HK, apical AK. 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 12/2017 Echo: EF 45-50%, diff HK. Gr1 DD. Mildly reduced RV fxn. Mild RAE; c. 09/2022 Echo: EF 55-60%, mod LVH, GrI DD, nl RV fxn, mildly dil RA, mild MR.   Hyperlipidemia LDL goal <70    Ischemic cardiomyopathy    a. 08/2017 TEE: EF 30-35%; b. 12/2017 Echo: EF 45-50%; c. 09/2022 Echo: EF 55-60%.   Myocardial infarction (HCC) 08/27/2017   Shingles    2018 - right side   Vertigo    several months ago     Assessment: Pt is a 78 yo male presenting to admitted 2/11 following cardiac arrested now found with "moderate right-sided arterial clot burden... with occlusive clot in the right upper lobe anterior and posterior segmental arteries, and nonoccluding thrombus in the right lower lobe main artery and superior and posterior basal segmental arteries."  Date/Time HL Rate  Comment 2/21 1412 0.16 900 units/hr Subtherapeutic 2/22 0106 0.32 1200 un/hr Therapeutic x 1  Goal of Therapy:  Heparin level 0.3-0.7 units/ml Monitor platelets by anticoagulation protocol: Yes   Plan:  Continue heparin infusion rate at 1200 units/hr Will recheck HL in 8 hr to confirm CBC daily while on heparin  Otelia Sergeant, PharmD, [facility] 04/16/2023 1:43 AM

## 2023-04-16 NOTE — Progress Notes (Signed)
 Patient had a cuff leak that respiratory was able to address. Also RT betty lavaged this patient after bath was preformed. Many thick , yellow secretions returned. Facial grimacing and gave PRN medications to help with vent synchrony. All medications infusing per order and policy. Provider aware. Will continue to monitor.

## 2023-04-16 NOTE — Progress Notes (Signed)
 No urine OP s/t LR bolus. Bladder scan completed by this RN totaling 618 cc. MD Kasa notified by RN and requested foley be replaced. Upon deflation of balloon, patient began urinating. Foley replaced and 950cc amber urine returned. Foley care provided prior and post. Bed change completed by RN and NT.

## 2023-04-16 NOTE — Progress Notes (Signed)
 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  He presented to [facility] ER via EMS from [facility] following a witnessed cardiac arrest.  Bystander CPR initiated and EMS arrived on the scene within 30 seconds cardiac rhythm asystole.  ROSC initially achieved, however pt in and out of PEA/asystole.    Pt recently evaluated in the ER on 02/4 with urinary urgency and frequency that started 5 days prior to that presentation.  Diagnosed with a UTI and prescribed Bactrim DS twice daily for 10 days.  However, symptoms persisted prompting [facility] visit today.  ED Course  Upon arrival to the ER pt on NRB with pulse.  It was reported during chest compressions the pt began to fight and pulling at medical devices.  While in the the ER waiting room the pt cardiac arrested again requiring ACLS protocol and mechanical intubation.  Pt with varied cardiac rhythm during cardiac arrest PEA/asystole/complete heart block.  He required transcutaneously pacing.  EKG showed apparent sinus rhythm with complete heart block and ventricular escape rhythm with LBBB morphology.  Total downtime estimated 2hrs from out of hospital cardiac arrest and inpatient cardiac arrest.  Pt required epinephrine gtt due to bradycardia and hypotension.  Pt taken emergently to the cardiac cath lab for temporary transvenous pacemaker placement, emergent cardiac catheterization, and swan-ganz catheter placement.   Cardiac cath revealed severe native CAD with patent LIMA-LAD and SVG-OM. SVG-RCA is occluded, but did not appear  acute and unlikely the inciting factor for today's cardiac arrest.  Per cardiology if pt makes a meaningful recovery he may require revascularization of the RCA.  Pt admitted to the ICU post cardiac cath PCCM consulted to asume care.    Hospital course: See significant events for more detail Overnight on 2/20-2/21 patient acutely decompensated with sudden bradycardia- pause and hypoxia into the 60's. Patient emergently intubated and place on mechanical ventilatory support with resolution of baseline rhythm. CT angio post intubation revealed moderate Right Upper & lower PE without heart strain.  Most recent Significant labs: (Labs/ Imaging personally reviewed)  Chemistry: Na+:149, K+: 3.3, BUN/Cr.: 49/ 1.48, Serum CO2/ AG: 21/11 Hematology: WBC: 8.3, Hgb: 9.8,  Troponin: 74, BNP: 498.8, Lactic: 2.8  ABG: 7.38/ 40/175/23.7 CXR 04/15/23: Patchy LLL retrocardiac opacity, suspicious for pneumonia CT head wo contrast 04/15/23: Small right frontal lobe cortical infarcts this month on MRI are occult by CT. No new intracranial abnormality identified. Ct angio chest 04/15/23:  Moderate right-sided arterial clot burden as described above, with occlusive clot in the right upper lobe anterior and posterior segmental arteries, and nonoccluding thrombus in the right lower lobe main artery and superior and posterior basal segmental arteries. 2. No increase in the RV/LV ratio, but there is mild contrast reflux into the IVC, and the right main pulmonary artery is more prominent than previously consistent with arterial congestion. Cardiomegaly with old sternotomy and CABG changes. 4. Small pleural effusions with interstitial and ground-glass edema in the upper lobes. 5. More confluent and dense consolidation in the lower lobes posteriorly, which could be due to consolidated alveolar edema, pneumonia or aspiration.  6. Aortic and coronary artery atherosclerosis. 7. Hepatic steatosis. 8. Cholelithiasis. 9. Nonobstructing  left renal stone. 10. Slightly displaced recent fractures of the left anterior second through sixth and right anterior fourth through seventh ribs, almost certainly CPR related. No pneumothorax. No mediastinal hematoma.  PCCM re-consulted due to emergent acute hypoxic respiratory failure secondary to moderate right upper & lower PE requiring emergent intubation and mechanical ventilatory support.  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  Worsening AKI and mild hyperkalemia, given shifting measures.  Low threshold for Nephrology consultation. 02/13: Weaning down vasopressors, Vasopressin off, Levo @ 7, Epi @ 6.  On minimal vent support.  Renal function and hyperkalemia slowly improving.  Advanced Heart Failure diuresing with 40 mg IV Lasix x1 dose for CVP of 15.  Perform WUA. 02/14: Weaned off vasopressors  On minimal vent support, failed WUA due to        severe hypertension and increased WOB.  On WUA only opening eyes, not        following commands.  MRI Brain revealed 2 small acute infarctions affecting the        cortical brain in theright frontal region.Theone Murdoch, temporary pacer and right        femoral venous sheath removed.  AKI slowly improving.  Heparin gtt discontinued        given low suspicion for PE.  To have PICC placed per Heart Failure team request. 02/15: Pt remains mechanically intubated.  All sedation off  brainstem reflexes intact and opens eyes to voice.  Mentation precluding extubation.  Neurology consulted 02/16: Overnight pt developed worsening acute respiratory failure with accessory muscle use, tachycardia, and hypoxia requiring bagging via BVM, therefore propofol gtt restarted.  Neurology recommending MRA Head/Neck and EEG once stable to transfer.  Performing WUA and possible SBT today  2/17: No significant events overnight, mental status still lagging. stroke work up pending per neuro recs 2/18: No significant events overnight. EEG without seizures, CTH negative, and Neck as below. Neuro following for neuro prognostication 2/19: following commands, extubated 2/20: patient confused and agitated, no SOB on 4 L [place]. Overnight patient became bradycardic with pauses, while maintaining a pulse and hypoxic in the 60's. Emergently intubated and placed back on mechanical ventilatory support with return to normal rhythm. 2/22 severe hypoxia, b/l PE  Interim History / Subjective:  Remains critically ill Remains intubated Severe hypoxia Requires VENT support for survival  Vent Mode: PRVC FiO2 (%):  [80 %-90 %] 80 % Set Rate:  [15 bmp] 15 bmp Vt Set:  [500 mL] 500 mL PEEP:  [5 cmH20] 5 cmH20 Plateau Pressure:  [13 cmH20] 13 cmH20  Objective   Blood pressure (!) 145/53, pulse 98, temperature 98.6 F (37 C), temperature source Oral, resp. rate (!) 30, height 6' 0.01" (1.829 m), weight 104.2 kg, SpO2 100%.      Intake/Output Summary (Last 24 hours) at 04/16/2023 0757 Last data filed at 04/16/2023 0600 Gross per 24 hour  Intake 2188.78 ml  Output 2580  ml  Net -391.22 ml   Filed Weights   04/14/23 0436 04/15/23 0400 04/16/23 0443  Weight: 102.6 kg 102.7 kg 104.2 kg     REVIEW OF SYSTEMS  PATIENT IS UNABLE TO PROVIDE COMPLETE REVIEW OF SYSTEMS DUE TO SEVERE CRITICAL ILLNESS   PHYSICAL EXAMINATION:  GENERAL:critically ill appearing, +resp distress EYES: Pupils equal, round, reactive to  light.  No scleral icterus.  MOUTH: Moist mucosal membrane. INTUBATED NECK: Supple.  PULMONARY: Lungs clear to auscultation, +rhonchi, +wheezing CARDIOVASCULAR: S1 and S2.  Regular rate and rhythm GASTROINTESTINAL: Soft, nontender, -distended. Positive bowel sounds.  MUSCULOSKELETAL: No swelling, clubbing, or edema.  NEUROLOGIC: obtunded,sedated SKIN:normal, warm to touch, Capillary refill delayed  Pulses present bilaterally   Assessment & Plan:  78 yo white male admitted for acute Cardiac arrest CAD with Complete heart block s/p temporary transvenous pacemaker placement (removed 02/15) Junctional tachycardia  With  Acute on chronic HFrEF with Cardiogenic shock leading to acute severe hypoxic resp failure, s/p extubation, then with acute PE leading to severe hypoxia and needing re-intubation  Severe ACUTE Hypoxic and Hypercapnic Respiratory Failure -continue Mechanical Ventilator support -Wean Fio2 and PEEP as tolerated -VAP/VENT bundle implementation - Wean PEEP & FiO2 as tolerated, maintain SpO2 > 88% - Head of bed elevated 30 degrees, VAP protocol in place - Plateau pressures less than 30 cm H20  - Intermittent chest x-ray & ABG PRN - Ensure adequate pulmonary hygiene  -will perform SAT/SBT when respiratory parameters are met   NEUROLOGY ACUTE METABOLIC ENCEPHALOPATHY -need for sedation -Goal RASS -2 to -3 MR Brain 02/14: 2 small acute infarctions affecting the cortical brain in the right frontal region.  Mild chronic small-vessel ischemic change of the cerebral hemispheric white matter with a few old small cortical infarctions scattered about the right hemisphere suggesting chronic/recurrent micro embolic infarctions. Recommend carotid evaluation. US carotid showing < 50% stenosis. MRA Head/Neck with small infarcts, and EEG without seizures. 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 SVG-RCA is occluded, but did not appear acute and unlikely the inciting factor for today's cardiac arrest Echocardiogram 04/07/23:  LVEF 40-45%, mild LVH, Grade I DD, RV systolic function not well visualized, RV size normal - diuresis with lasix as hemodynamics and renal function allow - cardiology following, appreciate input  ACUTE KIDNEY INJURY/Renal Failure -continue Foley Catheter-assess need -Avoid nephrotoxic agents -Follow urine output, BMP -Ensure adequate renal perfusion, optimize oxygenation -Renal dose medications   Intake/Output Summary (Last 24 hours) at 04/16/2023 0801 Last data filed at 04/16/2023 0600 Gross per 24 hour  Intake 2188.78 ml  Output 2580 ml  Net -391.22 ml    ENDO - ICU hypoglycemic\Hyperglycemia protocol -check FSBS per protocol   GI GI PROPHYLAXIS as indicated NUTRITIONAL STATUS DIET-->TF's as tolerated Constipation protocol as  indicated   ELECTROLYTES -follow labs as needed -replace as needed -pharmacy consultation and following  RESTRICTIVE TRANSFUSION PROTOCOL TRANSFUSION  IF HGB<7  or ACTIVE BLEEDING OR DX of ACUTE CORONARY SYNDROMES  SEVERE HYPOXIA DIFFICULT TO WEAN    Best Practice (right click and "Reselect all SmartList Selections" daily)  Diet/type: NPO DVT  prophylaxis LMWH Pressure ulcer(s): N/A GI prophylaxis: H2B Lines: Portacath present on admission and Left Upper Extremity PICC and still needed Foley:  Yes, and it is still needed Code Status:  DNR with pre-interventions Last date of multidisciplinary goals of care discussion [04/14/23]  2/21: family updated about change in clinical status and emergent intubation requiring mechanical ventilator support. Labs   CBC: Recent Labs  Lab 04/12/23 0433 04/13/23 0333 04/14/23 0432 04/15/23 0337 04/16/23 0439  WBC 7.2 6.6 7.0 8.3 13.2*  HGB 8.4* 9.3* 9.7* 9.8* 8.6*  HCT 26.2* 28.4* 30.0* 29.7* 25.9*  MCV 104.4* 102.5* 102.0* 100.3* 100.0  PLT 117* 154 194 226 206    Basic Metabolic Panel: Recent Labs  Lab 04/12/23 0433 04/12/23 2008 04/13/23 0333 04/14/23 0432 04/15/23 0337 04/15/23 0350 04/16/23 0439  NA 145  --  146* 150*  --  149* 144  K 3.2* 3.8 3.9 3.3*  --  3.3* 3.6  CL 112*  --  114* 115*  --  117* 114*  CO2 24  --  23 24  --  21* 22  GLUCOSE 106*  --  131* 100*  --  174* 99  BUN 51*  --  60* 52*  --  49* 41*  CREATININE 1.34*  --  1.42* 1.37*  --  1.48* 1.37*  CALCIUM 8.4*  --  8.7* 8.6*  --  8.7* 8.3*  MG 2.2  --  2.2 2.2 2.4  --  2.2  PHOS 3.6  --  3.5 3.1  --  5.6* 4.0   GFR: Estimated Creatinine Clearance: 57.2 mL/min (A) (by C-G formula based on SCr of 1.37 mg/dL (H)). Recent Labs  Lab 04/09/23 1453 04/10/23 0421 04/13/23 1610 04/14/23 0432 04/15/23 0337 04/15/23 0634 04/15/23 0637 04/16/23 0439  PROCALCITON 1.70  --   --   --   --   --  0.22  --   WBC  --    < > 6.6 7.0 8.3  --   --  13.2*  LATICACIDVEN  --   --   --   --  2.8* 1.1  --   --    < > = values in this interval not displayed.    Liver Function Tests: Recent Labs  Lab 04/09/23 1453 04/13/23 0333 04/14/23 0432 04/15/23 0350 04/16/23 0439  AST 37  --   --   --   --   ALT 64*  --   --   --   --   ALKPHOS 73  --   --   --   --   BILITOT 0.9  --   --   --   --   PROT 5.3*  --   --    --   --   ALBUMIN 2.4* 2.6* 2.8* 2.6* 2.5*   No results for input(s): "LIPASE", "AMYLASE" in the last 168 hours. No results for input(s): "AMMONIA" in the last 168 hours.  ABG    Component Value Date/Time   PHART 7.38 04/15/2023 0515   PCO2ART 40 04/15/2023 0515   PO2ART 175 (H) 04/15/2023 0515   HCO3 23.7 04/15/2023 0515   TCO2 33 (H) 04/05/2023  1459   ACIDBASEDEF 1.3 04/15/2023 0515   O2SAT 100 04/15/2023 0515     Coagulation Profile: No results for input(s): "INR", "PROTIME" in the last 168 hours.   Cardiac Enzymes: No results for input(s): "CKTOTAL", "CKMB", "CKMBINDEX", "TROPONINI" in the last 168 hours.  HbA1C: Hgb A1c MFr Bld  Date/Time Value Ref Range Status  04/05/2023 07:38 PM 5.9 (H) 4.8 - 5.6 % Final    Comment:    (NOTE) Pre diabetes:          5.7%-6.4%  Diabetes:              >6.4%  Glycemic control for   <7.0% adults with diabetes   08/28/2017 02:27 AM 5.6 4.8 - 5.6 % Final    Comment:    (NOTE) Pre diabetes:          5.7%-6.4% Diabetes:              >6.4% Glycemic control for   <7.0% adults with diabetes     CBG: Recent Labs  Lab 04/15/23 1542 04/15/23 1935 04/15/23 2350 04/16/23 0335 04/16/23 0731  GLUCAP 104* 100* 93 82 107*    Review of Systems:   Unable to assess pt mechanically intubated   Past Medical History:  He,  has a past medical history of Alternating RBBB & LBBB, Ankle swelling (2024), Arthritis, Bladder tumor, CAD (coronary artery disease), Cancer (HCC), Cancer of base of tongue (HCC), Essential hypertension, 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   Surgical History:   Past Surgical History:  Procedure Laterality Date   CARDIAC CATHETERIZATION     CATARACT EXTRACTION W/PHACO Left 07/18/2018   Procedure: CATARACT EXTRACTION PHACO AND INTRAOCULAR LENS PLACEMENT (IOC)  LEFT;   Surgeon: Nevada Crane, MD;  Location: [facility];  Service: Ophthalmology;  Laterality: Left;   CATARACT EXTRACTION W/PHACO Right 08/14/2018   Procedure: CATARACT EXTRACTION PHACO AND INTRAOCULAR LENS PLACEMENT (IOC)  RIGHT;  Surgeon: Nevada Crane, MD;  Location: [facility];  Service: Ophthalmology;  Laterality: Right;   CORONARY ARTERY BYPASS GRAFT N/A 08/27/2017   Procedure: CORONARY ARTERY BYPASS GRAFTING (CABG) ON PUMP USING LEFT INTERNAL MAMMARY ARTERY AND LEFT GREATER SAPHENOUS VEIN VIA ENDOVEIN HARVEST;  Surgeon: Alleen Borne, MD;  Location: [facility] OR;  Service: Open Heart Surgery;  Laterality: N/A;   CORONARY/GRAFT ACUTE MI REVASCULARIZATION N/A 08/27/2017   Procedure: Coronary/Graft Acute MI Revascularization;  Surgeon: Iran Ouch, MD;  Location: [facility] INVASIVE CV LAB;  Service: Cardiovascular;  Laterality: N/A;   CYSTOSCOPY W/ RETROGRADES Bilateral 12/25/2018   Procedure: CYSTOSCOPY WITH RETROGRADE PYELOGRAM;  Surgeon: Vanna Scotland, MD;  Location: [facility] ORS;  Service: Urology;  Laterality: Bilateral;   FRACTURE SURGERY Right 1958   arm and left wrist compound fracture, no metal   HERNIA REPAIR Right    inguinial   IR IMAGING GUIDED PORT INSERTION  10/15/2022   LEFT HEART CATH AND CORONARY ANGIOGRAPHY N/A 08/27/2017   Procedure: LEFT HEART CATH AND CORONARY ANGIOGRAPHY;  Surgeon: Iran Ouch, MD;  Location: [facility] IN[facility] CV [facility];  Service: Cardiovascular;  Laterality: N/A;   RIGHT/LEFT HEART CATH AND CORONARY/GRAFT ANGIOGRAPHY N/A 04/05/2023   Procedure: RIGHT/LEFT HEART CATH AND CORONARY/GRAFT ANGIOGRAPHY;  Surgeon: Yvonne Kendall, MD;  Location: [facility];  Service: Cardiovascular;  Laterality: N/A;   TEMPORARY PACEMAKER N/A 04/05/2023   Procedure: TEMPORARY PACEMAKER;  Surgeon: Yvonne Kendall, MD;  Location:  [facility];  Service: Cardiovascular;  Laterality: N/A;   TRANSURETHRAL RESECTION OF BLADDER TUMOR WITH MITOMYCIN-C N/A  12/25/2018   Procedure: TRANSURETHRAL RESECTION OF BLADDER TUMOR WITH Gemcitabine;  Surgeon: Vanna Scotland, MD;  Location: [facility] ORS;  Service: Urology;  Laterality: N/A;        Critical Care Time devoted to patient care services described in this note is 65 minutes.  Critical care was necessary to treat /prevent imminent and life-threatening deterioration. Overall, patient is critically ill, prognosis is guarded.  Patient with Multiorgan failure and at high risk for cardiac arrest and death.    Lucie Leather, M.D.  [facility]  Medical Director [facility] Medical Director [facility] Cardio-Pulmonary Department

## 2023-04-16 NOTE — Progress Notes (Signed)
  Echocardiogram 2D Echocardiogram has been performed. A Limited Echo was approved by Dr. Belia Heman and was performed. Definity IV ultrasound imaging agent used on this study.  Lenor Coffin 04/16/2023, 9:07 AM

## 2023-04-16 NOTE — Plan of Care (Signed)
?  Problem: Clinical Measurements: ?Goal: Cardiovascular complication will be avoided ?Outcome: Progressing ?  ?Problem: Nutrition: ?Goal: Adequate nutrition will be maintained ?Outcome: Progressing ?  ?Problem: Elimination: ?Goal: Will not experience complications related to bowel motility ?Outcome: Progressing ?  ?

## 2023-04-16 NOTE — Progress Notes (Signed)
 ANTICOAGULATION CONSULT NOTE  Pharmacy Consult for heparin infusion Indication: pulmonary embolus  No Known Allergies  Patient Measurements: Height: 6' 0.01" (182.9 cm) Weight: 104.2 kg (229 lb 11.5 oz) IBW/kg (Calculated) : 77.62 Heparin Dosing Weight: 99.4 kg  Vital Signs: Temp: 97.8 F (36.6 C) (02/22 1600) Temp Source: Oral (02/22 1320) BP: 120/52 (02/22 1600) Pulse Rate: 94 (02/22 1600)  Labs: Recent Labs    04/14/23 0432 04/15/23 0337 04/15/23 0350 04/15/23 0634 04/15/23 1412 04/15/23 1517 04/16/23 0106 04/16/23 0439 04/16/23 0826 04/16/23 1733  HGB 9.7* 9.8*  --   --   --   --   --  8.6*  --   --   HCT 30.0* 29.7*  --   --   --   --   --  25.9*  --   --   PLT 194 226  --   --   --   --   --  206  --   --   HEPARINUNFRC  --   --   --   --    < >  --  0.32  --  0.26* 0.41  CREATININE 1.37*  --  1.48*  --   --   --   --  1.37*  --   --   TROPONINIHS  --  74*  --  107*  --  85*  --   --   --   --    < > = values in this interval not displayed.    Estimated Creatinine Clearance: 57.2 mL/min (A) (by C-G formula based on SCr of 1.37 mg/dL (H)).   Medical History: Past Medical History:  Diagnosis Date   Alternating RBBB & LBBB    Ankle swelling 2024   Arthritis    neck   Bladder tumor    CAD (coronary artery disease)    a. 08/2017 STEMI/Cath: LM 80ost/d, LAD 100p, LCX 95 ost/p, RCA 60p, 49m, RPDA 80ost; b. 08/2017 CABG x 3: LIMA->LAD, VG->OM, VG->PDA.   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 08/2017 TEE: EF 30-35%, sept/ant/inf HK, apical AK. 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 12/2017 Echo: EF 45-50%, diff HK. Gr1 DD. Mildly reduced RV fxn. Mild RAE; c. 09/2022 Echo: EF 55-60%, mod LVH, GrI DD, nl RV fxn, mildly dil RA, mild MR.   Hyperlipidemia LDL goal <70    Ischemic cardiomyopathy    a. 08/2017 TEE: EF 30-35%; b. 12/2017 Echo: EF 45-50%; c.  09/2022 Echo: EF 55-60%.   Myocardial infarction (HCC) 08/27/2017   Shingles    2018 - right side   Vertigo    several months ago    Assessment: Pt is a 78 yo male presenting to admitted 2/11 following cardiac arrested now found with "moderate right-sided arterial clot burden... with occlusive clot in the right upper lobe anterior and posterior segmental arteries, and nonoccluding thrombus in the right lower lobe main artery and superior and posterior basal segmental arteries."  Date/Time HL Rate  Comment 2/21 1412 0.16 900 units/hr Subtherapeutic 2/22 0106 0.32 1200 un/hr Therapeutic x 1 2/22 0826 0.26 1200 units/hr Subtherapeutic 2/22 1733 0.41 1400 units/hr Therapeutic x 1  Goal of Therapy:  Heparin level 0.3-0.7 units/ml Monitor platelets by anticoagulation protocol: Yes   Plan:  Heparin level is therapeutic x 1. Continue heparin infusion at 1400  unit/hr. Recheck heparin level in 8 hours. CBC daily while on heparin.   Paulita Fujita, PharmD 04/16/2023 5:54 PM

## 2023-04-16 NOTE — Progress Notes (Signed)
 ANTICOAGULATION CONSULT NOTE  Pharmacy Consult for heparin infusion Indication: pulmonary embolus  No Known Allergies  Patient Measurements: Height: 6' 0.01" (182.9 cm) Weight: 104.2 kg (229 lb 11.5 oz) IBW/kg (Calculated) : 77.62 Heparin Dosing Weight: 99.4 kg  Vital Signs: Temp: 98.3 F (36.8 C) (02/22 0800) Temp Source: Oral (02/22 0800) BP: 145/53 (02/22 0700) Pulse Rate: 98 (02/22 0700)  Labs: Recent Labs    04/14/23 0432 04/15/23 0337 04/15/23 0350 04/15/23 0634 04/15/23 1412 04/15/23 1517 04/16/23 0106 04/16/23 0439 04/16/23 0826  HGB 9.7* 9.8*  --   --   --   --   --  8.6*  --   HCT 30.0* 29.7*  --   --   --   --   --  25.9*  --   PLT 194 226  --   --   --   --   --  206  --   HEPARINUNFRC  --   --   --   --  0.16*  --  0.32  --  0.26*  CREATININE 1.37*  --  1.48*  --   --   --   --  1.37*  --   TROPONINIHS  --  74*  --  107*  --  85*  --   --   --     Estimated Creatinine Clearance: 57.2 mL/min (A) (by C-G formula based on SCr of 1.37 mg/dL (H)).   Medical History: Past Medical History:  Diagnosis Date   Alternating RBBB & LBBB    Ankle swelling 2024   Arthritis    neck   Bladder tumor    CAD (coronary artery disease)    a. 08/2017 STEMI/Cath: LM 80ost/d, LAD 100p, LCX 95 ost/p, RCA 60p, 74m, RPDA 80ost; b. 08/2017 CABG x 3: LIMA->LAD, VG->OM, VG->PDA.   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 08/2017 TEE: EF 30-35%, sept/ant/inf HK, apical AK. 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 12/2017 Echo: EF 45-50%, diff HK. Gr1 DD. Mildly reduced RV fxn. Mild RAE; c. 09/2022 Echo: EF 55-60%, mod LVH, GrI DD, nl RV fxn, mildly dil RA, mild MR.   Hyperlipidemia LDL goal <70    Ischemic cardiomyopathy    a. 08/2017 TEE: EF 30-35%; b. 12/2017 Echo: EF 45-50%; c. 09/2022 Echo: EF 55-60%.   Myocardial infarction (HCC) 08/27/2017   Shingles    2018 - right  side   Vertigo    several months ago    Assessment: Pt is a 78 yo male presenting to admitted 2/11 following cardiac arrested now found with "moderate right-sided arterial clot burden... with occlusive clot in the right upper lobe anterior and posterior segmental arteries, and nonoccluding thrombus in the right lower lobe main artery and superior and posterior basal segmental arteries."  Date/Time HL Rate  Comment 2/21 1412 0.16 900 units/hr Subtherapeutic 2/22 0106 0.32 1200 un/hr Therapeutic x 1 2/22 0826 0.26 1200  Goal of Therapy:  Heparin level 0.3-0.7 units/ml Monitor platelets by anticoagulation protocol: Yes   Plan:  Heparin level is subtherapeutic. Will heparin bolus of 1500 units/hr. Increase heparin infusion at 1400 unit/hr. Recheck heparin level in 8 hours. CBC daily while on heparin.   Paschal Dopp, PharmD 04/16/2023 9:22 AM

## 2023-04-17 DIAGNOSIS — J9601 Acute respiratory failure with hypoxia: Secondary | ICD-10-CM | POA: Diagnosis not present

## 2023-04-17 DIAGNOSIS — I2699 Other pulmonary embolism without acute cor pulmonale: Secondary | ICD-10-CM | POA: Diagnosis not present

## 2023-04-17 DIAGNOSIS — G9341 Metabolic encephalopathy: Secondary | ICD-10-CM | POA: Diagnosis not present

## 2023-04-17 DIAGNOSIS — I469 Cardiac arrest, cause unspecified: Secondary | ICD-10-CM | POA: Diagnosis not present

## 2023-04-17 LAB — GLUCOSE, CAPILLARY
Glucose-Capillary: 58 mg/dL — ABNORMAL LOW (ref 70–99)
Glucose-Capillary: 75 mg/dL (ref 70–99)
Glucose-Capillary: 79 mg/dL (ref 70–99)
Glucose-Capillary: 85 mg/dL (ref 70–99)
Glucose-Capillary: 82 mg/dL (ref 70–99)
Glucose-Capillary: 81 mg/dL (ref 70–99)

## 2023-04-17 LAB — CBC
HCT: 25.9 % — ABNORMAL LOW (ref 39.0–52.0)
Hemoglobin: 8.2 g/dL — ABNORMAL LOW (ref 13.0–17.0)
MCH: 32.8 pg (ref 26.0–34.0)
MCHC: 31.7 g/dL (ref 30.0–36.0)
MCV: 103.6 fL — ABNORMAL HIGH (ref 80.0–100.0)
Platelets: 177 10*3/uL (ref 150–400)
RBC: 2.5 MIL/uL — ABNORMAL LOW (ref 4.22–5.81)
RDW: 13.1 % (ref 11.5–15.5)
WBC: 8.9 10*3/uL (ref 4.0–10.5)
nRBC: 0 % (ref 0.0–0.2)

## 2023-04-17 LAB — CULTURE, RESPIRATORY W GRAM STAIN

## 2023-04-17 LAB — RENAL FUNCTION PANEL
Albumin: 2.4 g/dL — ABNORMAL LOW (ref 3.5–5.0)
Anion gap: 9 (ref 5–15)
BUN: 43 mg/dL — ABNORMAL HIGH (ref 8–23)
CO2: 21 mmol/L — ABNORMAL LOW (ref 22–32)
Calcium: 8.3 mg/dL — ABNORMAL LOW (ref 8.9–10.3)
Chloride: 113 mmol/L — ABNORMAL HIGH (ref 98–111)
Creatinine, Ser: 1.39 mg/dL — ABNORMAL HIGH (ref 0.61–1.24)
GFR, Estimated: 53 mL/min — ABNORMAL LOW (ref >60)
Glucose, Bld: 97 mg/dL (ref 70–99)
Phosphorus: 3.6 mg/dL (ref 2.5–4.6)
Potassium: 3.5 mmol/L (ref 3.5–5.1)
Sodium: 143 mmol/L (ref 135–145)

## 2023-04-17 LAB — HEPARIN LEVEL (UNFRACTIONATED): Heparin Unfractionated: 0.44 [IU]/mL (ref 0.30–0.70)

## 2023-04-17 MED ORDER — FAMOTIDINE 20 MG PO TABS
20.0000 mg | ORAL_TABLET | Freq: Every day | ORAL | Status: DC
Start: 2023-04-17 — End: 2023-04-21
  Administered 2023-04-17 – 2023-04-21 (×5): 20 mg
  Filled 2023-04-17 (×5): qty 1

## 2023-04-17 MED ORDER — DEXTROSE 50 % IV SOLN
INTRAVENOUS | Status: AC
  Administered 2023-04-17: 25 mL
  Filled 2023-04-17: qty 50

## 2023-04-17 MED ORDER — POTASSIUM CHLORIDE 20 MEQ PO PACK
40.0000 meq | PACK | Freq: Once | ORAL | Status: AC
  Administered 2023-04-17: 40 meq
  Filled 2023-04-17: qty 2

## 2023-04-17 NOTE — Plan of Care (Signed)
  Problem: Clinical Measurements: Goal: Ability to maintain clinical measurements within normal limits will improve Outcome: Progressing Goal: Will remain free from infection Outcome: Progressing Goal: Diagnostic test results will improve Outcome: Progressing Goal: Respiratory complications will improve Outcome: Progressing Goal: Cardiovascular complication will be avoided Outcome: Progressing   Problem: Nutrition: Goal: Adequate nutrition will be maintained Outcome: Progressing   Problem: Elimination: Goal: Will not experience complications related to bowel motility Outcome: Progressing Goal: Will not experience complications related to urinary retention Outcome: Progressing   Problem: Pain Managment: Goal: General experience of comfort will improve and/or be controlled Outcome: Progressing   Problem: Skin Integrity: Goal: Risk for impaired skin integrity will decrease Outcome: Progressing

## 2023-04-17 NOTE — Progress Notes (Signed)
 PHARMACY CONSULT NOTE  Pharmacy Consult for Electrolyte Monitoring and Replacement   Recent Labs: Potassium (mmol/L)  Date Value  04/17/2023 3.5   Magnesium (mg/dL)  Date Value  40/98/1191 2.2   Calcium (mg/dL)  Date Value  47/82/9562 8.3 (L)   Albumin (g/dL)  Date Value  13/09/6576 2.4 (L)  04/01/2021 4.2   Phosphorus (mg/dL)  Date Value  46/96/2952 3.6   Sodium (mmol/L)  Date Value  04/17/2023 143  10/05/2022 141   Assessment: DC is a 78 yo male who presented to [facility] ED post cardiac arrest. Their downtime was 2 hours. Patient just re-intubated due to hypoxia and found to have "moderate right-sided arterial clot burden as described above, with occlusive clot in the right upper lobe anterior and posterior segmental arteries, and nonoccluding thrombus in the right lower  lobe main artery and superior and posterior basal segmental arteries." Pharmacy has been consult to manage this patient's electrolytes while in the ICU.   Fluids: free water 200 ml q4H  Nutrition: Prosource 60 ml daily + vital high protein 60 ml/hr.  Pertinent Medications: NA   Goal of Therapy:  Electrolytes WNL (K > 4 and Mg > 2 due to cardiac arrest)  Plan:  --Kcl 40 mEq x 1 --F/u with AM labs.   Tressie Ellis 04/17/2023 7:45 AM

## 2023-04-17 NOTE — Plan of Care (Signed)
  Problem: Education: Goal: Knowledge of General Education information will improve Description: Including pain rating scale, medication(s)/side effects and non-pharmacologic comfort measures Outcome: Not Progressing   Problem: Nutrition: Goal: Adequate nutrition will be maintained Outcome: Progressing   Problem: Elimination: Goal: Will not experience complications related to bowel motility Outcome: Progressing Goal: Will not experience complications related to urinary retention Outcome: Progressing   Problem: Pain Managment: Goal: General experience of comfort will improve and/or be controlled Outcome: Progressing   Problem: Safety: Goal: Ability to remain free from injury will improve Outcome: Progressing   Problem: Skin Integrity: Goal: Risk for impaired skin integrity will decrease Outcome: Progressing   Problem: Activity: Goal: Ability to return to baseline activity level will improve Outcome: Not Progressing   Problem: Cardiovascular: Goal: Ability to achieve and maintain adequate cardiovascular perfusion will improve Outcome: Progressing   Pt intubated/sedated. Pt's family updated of POC when called for updates.

## 2023-04-17 NOTE — Progress Notes (Signed)
 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  He presented to [facility] ER via EMS from [facility] following a witnessed cardiac arrest.  Bystander CPR initiated and EMS arrived on the scene within 30 seconds cardiac rhythm asystole.  ROSC initially achieved, however pt in and out of PEA/asystole.    Pt recently evaluated in the ER on 02/4 with urinary urgency and frequency that started 5 days prior to that presentation.  Diagnosed with a UTI and prescribed Bactrim DS twice daily for 10 days.  However, symptoms persisted prompting [facility] visit today.  ED Course  Upon arrival to the ER pt on NRB with pulse.  It was reported during chest compressions the pt began to fight and pulling at medical devices.  While in the the ER waiting room the pt cardiac arrested again requiring ACLS protocol and mechanical intubation.  Pt with varied cardiac rhythm during cardiac arrest PEA/asystole/complete heart block.  He required transcutaneously pacing.  EKG showed apparent sinus rhythm with complete heart block and ventricular escape rhythm with LBBB morphology.  Total downtime estimated 2hrs from out of hospital cardiac arrest and inpatient cardiac arrest.  Pt required epinephrine gtt due to bradycardia and hypotension.  Pt taken emergently to the cardiac cath lab for temporary transvenous pacemaker placement, emergent cardiac catheterization, and swan-ganz catheter placement.   Cardiac cath revealed severe native CAD with patent LIMA-LAD and SVG-OM. SVG-RCA is occluded, but did not appear  acute and unlikely the inciting factor for today's cardiac arrest.  Per cardiology if pt makes a meaningful recovery he may require revascularization of the RCA.  Pt admitted to the ICU post cardiac cath PCCM consulted to asume care.    Hospital course: See significant events for more detail Overnight on 2/20-2/21 patient acutely decompensated with sudden bradycardia- pause and hypoxia into the 60's. Patient emergently intubated and place on mechanical ventilatory support with resolution of baseline rhythm. CT angio post intubation revealed moderate Right Upper & lower PE without heart strain.  Most recent Significant labs: (Labs/ Imaging personally reviewed)  Chemistry: Na+:149, K+: 3.3, BUN/Cr.: 49/ 1.48, Serum CO2/ AG: 21/11 Hematology: WBC: 8.3, Hgb: 9.8,  Troponin: 74, BNP: 498.8, Lactic: 2.8  ABG: 7.38/ 40/175/23.7 CXR 04/15/23: Patchy LLL retrocardiac opacity, suspicious for pneumonia CT head wo contrast 04/15/23: Small right frontal lobe cortical infarcts this month on MRI are occult by CT. No new intracranial abnormality identified. Ct angio chest 04/15/23:  Moderate right-sided arterial clot burden as described above, with occlusive clot in the right upper lobe anterior and posterior segmental arteries, and nonoccluding thrombus in the right lower lobe main artery and superior and posterior basal segmental arteries. 2. No increase in the RV/LV ratio, but there is mild contrast reflux into the IVC, and the right main pulmonary artery is more prominent than previously consistent with arterial congestion. Cardiomegaly with old sternotomy and CABG changes. 4. Small pleural effusions with interstitial and ground-glass edema in the upper lobes. 5. More confluent and dense consolidation in the lower lobes posteriorly, which could be due to consolidated alveolar edema, pneumonia or aspiration.  6. Aortic and coronary artery atherosclerosis. 7. Hepatic steatosis. 8. Cholelithiasis. 9. Nonobstructing  left renal stone. 10. Slightly displaced recent fractures of the left anterior second through sixth and right anterior fourth through seventh ribs, almost certainly CPR related. No pneumothorax. No mediastinal hematoma.  PCCM re-consulted due to emergent acute hypoxic respiratory failure secondary to moderate right upper & lower PE requiring emergent intubation and mechanical ventilatory support.  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  Worsening AKI and mild hyperkalemia, given shifting measures.  Low threshold for Nephrology consultation. 02/13: Weaning down vasopressors, Vasopressin off, Levo @ 7, Epi @ 6.  On minimal vent support.  Renal function and hyperkalemia slowly improving.  Advanced Heart Failure diuresing with 40 mg IV Lasix x1 dose for CVP of 15.  Perform WUA. 02/14: Weaned off vasopressors  On minimal vent support, failed WUA due to        severe hypertension and increased WOB.  On WUA only opening eyes, not        following commands.  MRI Brain revealed 2 small acute infarctions affecting the        cortical brain in theright frontal region.Theone Murdoch, temporary pacer and right        femoral venous sheath removed.  AKI slowly improving.  Heparin gtt discontinued        given low suspicion for PE.  To have PICC placed per Heart Failure team request. 02/15: Pt remains mechanically intubated.  All sedation off  brainstem reflexes intact and opens eyes to voice.  Mentation precluding extubation.  Neurology consulted 02/16: Overnight pt developed worsening acute respiratory failure with accessory muscle use, tachycardia, and hypoxia requiring bagging via BVM, therefore propofol gtt restarted.  Neurology recommending MRA Head/Neck and EEG once stable to transfer.  Performing WUA and possible SBT today  2/17: No significant events overnight, mental status still lagging. stroke work up pending per neuro recs 2/18: No significant events overnight. EEG without seizures, CTH negative, and Neck as below. Neuro following for neuro prognostication 2/19: following commands, extubated 2/20: patient confused and agitated, no SOB on 4 L [place]us. Overnight patient became bradycardic with pauses, while maintaining a pulse and hypoxic in the 60's. Emergently intubated and placed back on mechanical ventilatory support with return to normal rhythm. 2/22 severe hypoxia, b/l PE 2/23 severe hypoxia b/l PE  Interim History / Subjective:  Remains critically ill Remains intubated Severe hypoxia Requires VENT support for survival  Vent Mode: (P) PRVC FiO2 (%):  [60 %] 60 % Set Rate:  [15 bmp] (P) 15 bmp Vt Set:  [500 mL] (P) 500 mL PEEP:  [5 cmH20] (P) 5 cmH20 Plateau Pressure:  [11 cmH20] 11 cmH20  Objective   Blood pressure (!) 114/52, pulse 75, temperature 97.7 F (36.5 C), temperature source Axillary, resp. rate 14, height 6' 0.01" (1.829 m), weight 103.3 kg, SpO2 100%.      Intake/Output Summary (Last 24 hours) at 04/17/2023 0801 Last data filed at 04/17/2023 0600 Gross per 24 hour  Intake 3364.06 ml  Output 2310 ml  Net 1054.06 ml   Filed Weights   04/15/23 0400 04/16/23 0443 04/17/23 0411  Weight: 102.7 kg 104.2 kg 103.3 kg     REVIEW OF SYSTEMS  PATIENT IS UNABLE TO PROVIDE COMPLETE REVIEW OF SYSTEMS DUE TO SEVERE CRITICAL ILLNESS   PHYSICAL EXAMINATION:  GENERAL:critically ill appearing, +resp  distress EYES: Pupils equal, round, reactive to light.  No scleral icterus.  MOUTH: Moist mucosal membrane. INTUBATED NECK: Supple.  PULMONARY: Lungs clear to auscultation, +rhonchi, +wheezing CARDIOVASCULAR: S1 and S2.  Regular rate and rhythm GASTROINTESTINAL: Soft, nontender, -distended. Positive bowel sounds.  MUSCULOSKELETAL: No swelling, clubbing, or edema.  NEUROLOGIC: sedated SKIN:normal, warm to touch, Capillary refill delayed  Pulses present bilaterally   Assessment & Plan:  78 yo white male admitted for acute Cardiac arrest CAD with Complete heart block s/p temporary transvenous pacemaker placement (removed 02/15) Junctional tachycardia  With  Acute on chronic HFrEF with Cardiogenic shock leading to acute severe hypoxic resp failure, s/p extubation, then with acute PE leading to severe hypoxia and needing re-intubation  Severe ACUTE Hypoxic and Hypercapnic Respiratory Failure -continue Mechanical Ventilator support -Wean Fio2 and PEEP as tolerated -VAP/VENT bundle implementation - Wean PEEP & FiO2 as tolerated, maintain SpO2 > 88% - Head of bed elevated 30 degrees, VAP protocol in place - Plateau pressures less than 30 cm H20  - Intermittent chest x-ray & ABG PRN - Ensure adequate pulmonary hygiene  Unable to wean off vent today severe hypoxia   NEUROLOGY ACUTE METABOLIC ENCEPHALOPATHY -need for sedation -Goal RASS -2 to -3 MR Brain 02/14: 2 small acute infarctions affecting the cortical brain in the right frontal region.  Mild chronic small-vessel ischemic change of the cerebral hemispheric white matter with a few old small cortical infarctions scattered about the right hemisphere suggesting chronic/recurrent micro embolic infarctions. Recommend carotid evaluation. US carotid showing < 50% stenosis. MRA Head/Neck with small infarcts, and EEG without seizures. Repeat CTH no new acute intracranial abnormality - Neurology consulted appreciate input  - Stroke work up per  Neuro recs - Continue ASA 81mg  daily + plavix 75mg  daily x21 days f/b ASA 81mg  daily monotherapy after that  - Continue atorvastatin 80mg  daily     Moderate Right upper & Lower Pulmonary embolism  - Systemic Heparin drip per pharmacy protocol - Follow up BLE dopplers to assess for DVT - Echocardiogram ordered  - consider Vascular consult if IVC filter/ thrombectomy is warranted   ACUTE SYSTOLIC CARDIAC FAILURE-  Cardiac arrest  Complete heart block s/p temporary transvenous pacemaker placement (removed 02/15) Junctional tachycardia,CAD HTN Acute on chronic HFrEF  Cardiac Cath 02/11: severe native CAD with patent LIMA-LAD and SVG-OM. SVG-RCA is occluded, but did not appear acute and unlikely the inciting factor for today's cardiac arrest Echocardiogram 04/07/23:  LVEF 40-45%, mild LVH, Grade I DD, RV systolic function not well visualized, RV size normal - diuresis with lasix as hemodynamics and renal function allow - cardiology following, appreciate input  ACUTE KIDNEY INJURY/Renal Failure -continue Foley Catheter-assess need -Avoid nephrotoxic agents -Follow urine output, BMP -Ensure adequate renal perfusion, optimize oxygenation -Renal dose medications   Intake/Output Summary (Last 24 hours) at 04/17/2023 0803 Last data filed at 04/17/2023 0600 Gross per 24 hour  Intake 3364.06 ml  Output 2310 ml  Net 1054.06 ml      Latest Ref Rng & Units 04/17/2023    1:57 AM 04/16/2023    4:39 AM 04/15/2023    3:50 AM  BMP  Glucose 70 - 99 mg/dL 97  99  161   BUN 8 - 23 mg/dL 43  41  49   Creatinine 0.61 - 1.24 mg/dL 0.96  0.45  4.09   Sodium 135 - 145 mmol/L 143  144  149   Potassium 3.5 - 5.1 mmol/L 3.5  3.6  3.3   Chloride 98 - 111 mmol/L 113  114  117   CO2 22 - 32 mmol/L 21  22  21    Calcium 8.9 - 10.3 mg/dL 8.3  8.3  8.7       ENDO - ICU hypoglycemic\Hyperglycemia protocol -check FSBS per protocol   GI GI PROPHYLAXIS as indicated NUTRITIONAL STATUS DIET-->TF's as  tolerated Constipation protocol as indicated   ELECTROLYTES -follow labs as needed -replace as needed -pharmacy consultation and following  RESTRICTIVE TRANSFUSION PROTOCOL TRANSFUSION  IF HGB<7  or ACTIVE BLEEDING OR DX of ACUTE CORONARY SYNDROMES    SEVERE HYPOXIA DIFFICULT TO WEAN    Best Practice (right click and "Reselect all SmartList Selections" daily)  Diet/type: NPO DVT prophylaxis LMWH Pressure ulcer(s): N/A GI prophylaxis: H2B Lines: Portacath present on admission and Left Upper Extremity PICC and still needed Foley:  Yes, and it is still needed Code Status:  DNR with pre-interventions Last date of multidisciplinary goals of care discussion [04/14/23]  2/22: family updated about change in clinical status and emergent intubation requiring mechanical ventilator support. Labs   CBC: Recent Labs  Lab 04/13/23 0333 04/14/23 0432 04/15/23 0337 04/16/23 0439 04/17/23 0157  WBC 6.6 7.0 8.3 13.2* 8.9  HGB 9.3* 9.7* 9.8* 8.6* 8.2*  HCT 28.4* 30.0* 29.7* 25.9* 25.9*  MCV 102.5* 102.0* 100.3* 100.0 103.6*  PLT 154 194 226 206 177    Basic Metabolic Panel: Recent Labs  Lab 04/12/23 0433 04/12/23 2008 04/13/23 0333 04/14/23 0432 04/15/23 0337 04/15/23 0350 04/16/23 0439 04/17/23 0157  NA 145  --  146* 150*  --  149* 144 143  K 3.2*   < > 3.9 3.3*  --  3.3* 3.6 3.5  CL 112*  --  114* 115*  --  117* 114* 113*  CO2 24  --  23 24  --  21* 22 21*  GLUCOSE 106*  --  131* 100*  --  174* 99 97  BUN 51*  --  60* 52*  --  49* 41* 43*  CREATININE 1.34*  --  1.42* 1.37*  --  1.48* 1.37* 1.39*  CALCIUM 8.4*  --  8.7* 8.6*  --  8.7* 8.3* 8.3*  MG 2.2  --  2.2 2.2 2.4  --  2.2  --   PHOS 3.6  --  3.5 3.1  --  5.6* 4.0 3.6   < > = values in this interval not displayed.   GFR: Estimated Creatinine Clearance: 56.2 mL/min (A) (by C-G formula based on SCr of 1.39 mg/dL (H)). Recent Labs  Lab 04/14/23 0432 04/15/23 0337 04/15/23 0634 04/15/23 0637 04/16/23 0439  04/17/23 0157  PROCALCITON  --   --   --  0.22  --   --   WBC 7.0 8.3  --   --  13.2* 8.9  LATICACIDVEN  --  2.8* 1.1  --   --   --     Liver Function Tests: Recent Labs  Lab 04/13/23 0333 04/14/23 0432 04/15/23 0350 04/16/23 0439 04/17/23 0157  ALBUMIN 2.6* 2.8* 2.6* 2.5* 2.4*   No results for input(s): "LIPASE", "AMYLASE" in the last 168 hours.  No results for input(s): "AMMONIA" in the last 168 hours.  ABG    Component Value Date/Time   PHART 7.38 04/15/2023 0515   PCO2ART 40 04/15/2023 0515   PO2ART 175 (H) 04/15/2023 0515   HCO3 23.7 04/15/2023 0515   TCO2 33 (H) 04/05/2023 1459   ACIDBASEDEF 1.3 04/15/2023 0515   O2SAT 100 04/15/2023 0515     Coagulation Profile: No results for input(s): "INR", "PROTIME" in the last 168 hours.   Cardiac Enzymes: No results for input(s): "CKTOTAL", "CKMB", "CKMBINDEX", "TROPONINI" in the last 168 hours.  HbA1C: Hgb A1c MFr Bld  Date/Time Value Ref Range Status  04/05/2023 07:38 PM 5.9 (H) 4.8 - 5.6 % Final    Comment:    (NOTE) Pre diabetes:          5.7%-6.4%  Diabetes:              >6.4%  Glycemic control for   <7.0% adults with diabetes   08/28/2017 02:27 AM 5.6 4.8 - 5.6 % Final    Comment:    (NOTE) Pre diabetes:          5.7%-6.4% Diabetes:              >6.4% Glycemic control for   <7.0% adults with diabetes     CBG: Recent Labs  Lab 04/16/23 1940 04/16/23 2350 04/17/23 0354 04/17/23 0417 04/17/23 0732  GLUCAP 100* 75 58* 75 79    Review of Systems:   Unable to assess pt mechanically intubated   Past Medical History:  He,  has a past medical history of Alternating RBBB & LBBB, Ankle swelling (2024), Arthritis, Bladder tumor, CAD (coronary artery disease), Cancer (HCC), Cancer of base of tongue (HCC), Essential hypertension, HFimpEF (heart failure with improved ejection fraction) (HCC), Hyperlipidemia LDL goal <70, Ischemic cardiomyopathy, Myocardial infarction (HCC) (08/27/2017), Shingles, and  Vertigo.   Surgical History:   Past Surgical History:  Procedure Laterality Date   CARDIAC CATHETERIZATION     CATARACT EXTRACTION W/PHACO Left 07/18/2018   Procedure: CATARACT EXTRACTION PHACO AND INTRAOCULAR LENS PLACEMENT (IOC)  LEFT;  Surgeon: Nevada Crane, MD;  Location[facility];  Service: Ophthalmology;  Laterality: Left;   CATARACT EXTRACTION W/PHACO Right 08/14/2018   Procedure: CATARACT EXTRACTION PHACO AND INTRAOCULAR LENS PLACEMENT (IOC)  RIGHT;  Surgeon: Nevada Crane, MD;  Location: [facility];  Service: Ophthalmology;  Laterality: Right;   CORONARY ARTERY BYPASS GRAFT N/A 08/27/2017   Procedure: CORONARY ARTERY BYPASS GRAFTING (CABG) ON PUMP USING LEFT INTERNAL MAMMARY ARTERY AND LEFT GREATER SAPHENOUS VEIN VIA ENDOVEIN HARVEST;  Surgeon: Alleen Borne, MD;  Location: [facility] OR;  Service: Open Heart Surgery;  Laterality: N/A;   CORONARY/GRAFT ACUTE MI REVASCULARIZATION N/A 08/27/2017   Procedure: Coronary/Graft Acute MI Revascularization;  Surgeon: Iran Ouch, MD;  Location: [facility] INVASIVE CV LAB;  Service: Cardiovascular;  Laterality: N/A;   CYSTOSCOPY W/ RETROGRADES Bilateral 12/25/2018   Procedure: CYSTOSCOPY WITH RETROGRADE PYELOGRAM;  Surgeon: Vanna Scotland, MD;  Location: [facility] ORS;  Service: Urology;  Laterality: Bilateral;   FRACTURE SURGERY Right 1958   arm and left wrist compound fracture, no metal   HERNIA REPAIR Right    inguinial   IR IMAGING GUIDED PORT INSERTION  10/15/2022   LEFT HEART CATH AND CORONARY ANGIOGRAPHY N/A 08/27/2017   Procedure: LEFT HEART CATH AND CORONARY ANGIOGRAPHY;  Surgeon: Iran Ouch, MD;  Location: [facility] INVASIVE CV LAB;  Service: Cardiovascular;  Laterality: N/A;   RIGHT/LEFT HEART CATH  AND CORONARY/GRAFT ANGIOGRAPHY N/A 04/05/2023   Procedure: RIGHT/LEFT HEART CATH AND CORONARY/GRAFT ANGIOGRAPHY;  Surgeon: Yvonne Kendall, MD;  Location: [facility] INVASIVE CV LAB;  Service: Cardiovascular;  Laterality: N/A;    TEMPORARY PACEMAKER N/A 04/05/2023   Procedure: TEMPORARY PACEMAKER;  Surgeon: Yvonne Kendall, MD;  Location: [facility] INVASIVE CV LAB;  Service: Cardiovascular;  Laterality: N/A;   TRANSURETHRAL RESECTION OF BLADDER TUMOR WITH MITOMYCIN-C N/A 12/25/2018   Procedure: TRANSURETHRAL RESECTION OF BLADDER TUMOR WITH Gemcitabine;  Surgeon: Vanna Scotland, MD;  Location: [facility] ORS;  Service: Urology;  Laterality: N/A;       DVT/GI PRX  assessed I Assessed the need for Labs I Assessed the need for Foley I Assessed the need for Central Venous Line Family Discussion when available I Assessed the need for Mobilization I made an Assessment of medications to be adjusted accordingly Safety Risk assessment completed  CASE DISCUSSED IN MULTIDISCIPLINARY ROUNDS WITH ICU TEAM     Critical Care Time devoted to patient care services described in this note is 55 minutes.  Critical care was necessary to treat /prevent imminent and life-threatening deterioration. Overall, patient is critically ill, prognosis is guarded.  Patient with Multiorgan failure and at high risk for cardiac arrest and death.    Lucie Leather, M.D.  [facility]  Medical Director [facility] Medical Director [facility] Cardio-Pulmonary Department

## 2023-04-17 NOTE — Progress Notes (Signed)
 ANTICOAGULATION CONSULT NOTE  Pharmacy Consult for heparin infusion Indication: pulmonary embolus  No Known Allergies  Patient Measurements: Height: 6' 0.01" (182.9 cm) Weight: 104.2 kg (229 lb 11.5 oz) IBW/kg (Calculated) : 77.62 Heparin Dosing Weight: 99.4 kg  Vital Signs: Temp: 99.1 F (37.3 C) (02/23 0000) Temp Source: Oral (02/23 0000) BP: 117/51 (02/23 0200) Pulse Rate: 95 (02/23 0200)  Labs: Recent Labs    04/15/23 0337 04/15/23 0350 04/15/23 0634 04/15/23 1412 04/15/23 1517 04/16/23 0106 04/16/23 0439 04/16/23 0826 04/16/23 1733 04/17/23 0157  HGB 9.8*  --   --   --   --   --  8.6*  --   --  8.2*  HCT 29.7*  --   --   --   --   --  25.9*  --   --  25.9*  PLT 226  --   --   --   --   --  206  --   --  177  HEPARINUNFRC  --   --   --    < >  --    < >  --  0.26* 0.41 0.44  CREATININE  --  1.48*  --   --   --   --  1.37*  --   --  1.39*  TROPONINIHS 74*  --  107*  --  85*  --   --   --   --   --    < > = values in this interval not displayed.    Estimated Creatinine Clearance: 56.4 mL/min (A) (by C-G formula based on SCr of 1.39 mg/dL (H)).   Medical History: Past Medical History:  Diagnosis Date   Alternating RBBB & LBBB    Ankle swelling 2024   Arthritis    neck   Bladder tumor    CAD (coronary artery disease)    a. 08/2017 STEMI/Cath: LM 80ost/d, LAD 100p, LCX 95 ost/p, RCA 60p, 80m, RPDA 80ost; b. 08/2017 CABG x 3: LIMA->LAD, VG->OM, VG->PDA.   Cancer [facility])    bladder tumor   Cancer of base of tongue (HCC)    Essential hypertension    HFimpEF (heart failure with improved ejection fraction) (HCC)    a. 08/2017 TEE: EF 30-35%, sept/ant/inf HK, apical AK. 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 12/2017 Echo: EF 45-50%, diff HK. Gr1 DD. Mildly reduced RV fxn. Mild RAE; c. 09/2022 Echo: EF 55-60%, mod LVH, GrI DD, nl RV fxn, mildly dil RA, mild MR.   Hyperlipidemia LDL goal <70    Ischemic cardiomyopathy    a. 08/2017 TEE: EF 30-35%; b. 12/2017 Echo: EF 45-50%; c.  09/2022 Echo: EF 55-60%.   Myocardial infarction (HCC) 08/27/2017   Shingles    2018 - right side   Vertigo    several months ago    Assessment: Pt is a 78 yo male presenting to admitted 2/11 following cardiac arrested now found with "moderate right-sided arterial clot burden... with occlusive clot in the right upper lobe anterior and posterior segmental arteries, and nonoccluding thrombus in the right lower lobe main artery and superior and posterior basal segmental arteries."  Date/Time HL Rate  Comment 2/21 1412 0.16 900 units/hr Subtherapeutic 2/22 0106 0.32 1200 un/hr Therapeutic x 1 2/22 0826 0.26 1200 units/hr Subtherapeutic 2/22 1733 0.41 1400 units/hr Therapeutic x 1 2/23 0157 0.44 1400 un/hr Therapeutic x 2  Goal of Therapy:  Heparin level 0.3-0.7 units/ml Monitor platelets by anticoagulation protocol: Yes   Plan:  Heparin level  is therapeutic x 2. Continue heparin infusion at 1400 unit/hr. Recheck heparin level daily w/ AM labs while therapeutic. CBC daily while on heparin.   Otelia Sergeant, PharmD, [facility] 04/17/2023 2:42 AM

## 2023-04-18 ENCOUNTER — Encounter
Admission: EM | Disposition: A | Discharge status: Nursing Home (Includes ICF) | Payer: Self-pay | Source: Ambulatory Visit | Attending: Internal Medicine

## 2023-04-18 DIAGNOSIS — Z9911 Dependence on respirator [ventilator] status: Secondary | ICD-10-CM

## 2023-04-18 DIAGNOSIS — I469 Cardiac arrest, cause unspecified: Secondary | ICD-10-CM | POA: Diagnosis not present

## 2023-04-18 DIAGNOSIS — I2699 Other pulmonary embolism without acute cor pulmonale: Secondary | ICD-10-CM | POA: Diagnosis not present

## 2023-04-18 DIAGNOSIS — Z87891 Personal history of nicotine dependence: Secondary | ICD-10-CM

## 2023-04-18 DIAGNOSIS — Z951 Presence of aortocoronary bypass graft: Secondary | ICD-10-CM | POA: Diagnosis not present

## 2023-04-18 DIAGNOSIS — J9601 Acute respiratory failure with hypoxia: Secondary | ICD-10-CM | POA: Diagnosis not present

## 2023-04-18 HISTORY — PX: PULMONARY THROMBECTOMY: CATH118295

## 2023-04-18 LAB — GLUCOSE, CAPILLARY
Glucose-Capillary: 79 mg/dL (ref 70–99)
Glucose-Capillary: 80 mg/dL (ref 70–99)
Glucose-Capillary: 81 mg/dL (ref 70–99)
Glucose-Capillary: 81 mg/dL (ref 70–99)
Glucose-Capillary: 87 mg/dL (ref 70–99)
Glucose-Capillary: 88 mg/dL (ref 70–99)
Glucose-Capillary: 91 mg/dL (ref 70–99)

## 2023-04-18 LAB — HEMOGLOBIN AND HEMATOCRIT, BLOOD
HCT: 23.7 % — ABNORMAL LOW (ref 39.0–52.0)
Hemoglobin: 7.5 g/dL — ABNORMAL LOW (ref 13.0–17.0)

## 2023-04-18 LAB — BASIC METABOLIC PANEL
Anion gap: 9 (ref 5–15)
BUN: 43 mg/dL — ABNORMAL HIGH (ref 8–23)
CO2: 21 mmol/L — ABNORMAL LOW (ref 22–32)
Calcium: 8.3 mg/dL — ABNORMAL LOW (ref 8.9–10.3)
Chloride: 112 mmol/L — ABNORMAL HIGH (ref 98–111)
Creatinine, Ser: 1.23 mg/dL (ref 0.61–1.24)
GFR, Estimated: >60 mL/min (ref >60)
Glucose, Bld: 92 mg/dL (ref 70–99)
Potassium: 3.9 mmol/L (ref 3.5–5.1)
Sodium: 142 mmol/L (ref 135–145)

## 2023-04-18 LAB — CBC
HCT: 24 % — ABNORMAL LOW (ref 39.0–52.0)
Hemoglobin: 7.8 g/dL — ABNORMAL LOW (ref 13.0–17.0)
MCH: 32.9 pg (ref 26.0–34.0)
MCHC: 32.5 g/dL (ref 30.0–36.0)
MCV: 101.3 fL — ABNORMAL HIGH (ref 80.0–100.0)
Platelets: 191 10*3/uL (ref 150–400)
RBC: 2.37 MIL/uL — ABNORMAL LOW (ref 4.22–5.81)
RDW: 13.3 % (ref 11.5–15.5)
WBC: 6.8 10*3/uL (ref 4.0–10.5)
nRBC: 0 % (ref 0.0–0.2)

## 2023-04-18 LAB — HEPARIN LEVEL (UNFRACTIONATED)
Heparin Unfractionated: 0.29 [IU]/mL — ABNORMAL LOW (ref 0.30–0.70)
Heparin Unfractionated: 0.4 [IU]/mL (ref 0.30–0.70)

## 2023-04-18 LAB — PREPARE RBC (CROSSMATCH)

## 2023-04-18 SURGERY — PULMONARY THROMBECTOMY
Anesthesia: Moderate Sedation | Laterality: Bilateral

## 2023-04-18 MED ORDER — HEPARIN (PORCINE) IN NACL 1000-0.9 UT/500ML-% IV SOLN
INTRAVENOUS | Status: DC | PRN
  Administered 2023-04-18: 1000 mL

## 2023-04-18 MED ORDER — POTASSIUM CHLORIDE 20 MEQ PO PACK
40.0000 meq | PACK | Freq: Once | ORAL | Status: AC
  Administered 2023-04-18: 40 meq
  Filled 2023-04-18: qty 2

## 2023-04-18 MED ORDER — SODIUM CHLORIDE 0.9% IV SOLUTION: Freq: Once | INTRAVENOUS | Status: AC

## 2023-04-18 MED ORDER — HEPARIN SODIUM (PORCINE) 1000 UNIT/ML IJ SOLN
INTRAMUSCULAR | Status: AC
  Filled 2023-04-18: qty 10

## 2023-04-18 MED ORDER — HEPARIN SODIUM (PORCINE) 1000 UNIT/ML IJ SOLN
INTRAMUSCULAR | Status: DC | PRN
  Administered 2023-04-18: 3000 [IU] via INTRAVENOUS

## 2023-04-18 MED ORDER — CEFAZOLIN SODIUM-DEXTROSE 2-4 GM/100ML-% IV SOLN
INTRAVENOUS | Status: AC
  Filled 2023-04-18: qty 100

## 2023-04-18 MED ORDER — CEFAZOLIN SODIUM-DEXTROSE 2-4 GM/100ML-% IV SOLN
2.0000 g | INTRAVENOUS | Status: AC
  Administered 2023-04-18: 2 g via INTRAVENOUS

## 2023-04-18 MED ORDER — SODIUM CHLORIDE 0.9 % IV SOLN: INTRAVENOUS | Status: DC

## 2023-04-18 MED ORDER — LIDOCAINE-EPINEPHRINE (PF) 1 %-1:200000 IJ SOLN
INTRAMUSCULAR | Status: DC | PRN
  Administered 2023-04-18: 10 mL via INTRADERMAL

## 2023-04-18 MED ORDER — MIDAZOLAM HCL 5 MG/5ML IJ SOLN
INTRAMUSCULAR | Status: AC
  Filled 2023-04-18: qty 5

## 2023-04-18 MED ORDER — FENTANYL CITRATE (PF) 100 MCG/2ML IJ SOLN
INTRAMUSCULAR | Status: AC
  Filled 2023-04-18: qty 2

## 2023-04-18 MED ORDER — IODIXANOL 320 MG/ML IV SOLN
INTRAVENOUS | Status: DC | PRN
  Administered 2023-04-18: 30 mL via INTRAVENOUS

## 2023-04-18 SURGICAL SUPPLY — 17 items
CANISTER PENUMBRA ENGINE (MISCELLANEOUS) IMPLANT
CATH ANGIO 5F PIGTAIL 100CM (CATHETERS) IMPLANT
CATH INDIGO 12XTORQ 100 (CATHETERS) IMPLANT
CATH INDIGO SEP 12 (CATHETERS) IMPLANT
CATH INFINITI JR4 5F (CATHETERS) IMPLANT
CATH SELECT BERN TIP 5F 130 (CATHETERS) IMPLANT
CLOSURE PERCLOSE PROSTYLE (VASCULAR PRODUCTS) IMPLANT
COVER PROBE ULTRASOUND 5X96 (MISCELLANEOUS) IMPLANT
GLIDEWIRE ADV .035X180CM (WIRE) IMPLANT
PACK ANGIOGRAPHY (CUSTOM PROCEDURE TRAY) ×1 IMPLANT
SHEATH BRITE TIP 6FRX11 (SHEATH) IMPLANT
SHEATH CHCK-FLO 14FR 13 (SHEATH) IMPLANT
SUT MNCRL AB 4-0 PS2 18 (SUTURE) IMPLANT
SYR MEDRAD MARK 7 150ML (SYRINGE) IMPLANT
TUBING CONTRAST HIGH PRESS 72 (TUBING) IMPLANT
WIRE GUIDERIGHT .035X150 (WIRE) IMPLANT
WIRE SUPRACORE 300CM (WIRE) IMPLANT

## 2023-04-18 NOTE — Progress Notes (Signed)
 Patient down for pulmonary thrombectomy.  He is extremely high risk with multiple ongoing issues having already had cardiac arrest on 2 instances prior to this.  He does have a very large PE particularly on the right and we will plan on pulmonary thrombectomy in hopes of improving his pulmonary status for extubation.  He is a very high risk patient and the family understands the serious nature of the situation and desires [place] to proceed.

## 2023-04-18 NOTE — Progress Notes (Signed)
 ANTICOAGULATION CONSULT NOTE  Pharmacy Consult for heparin infusion Indication: pulmonary embolus  No Known Allergies  Patient Measurements: Height: 6' 0.01" (182.9 cm) Weight: 103.2 kg (227 lb 8.2 oz) IBW/kg (Calculated) : 77.62 Heparin Dosing Weight: 99.4 kg  Vital Signs: Temp: 99 F (37.2 C) (02/24 0355) Temp Source: Oral (02/24 0355) BP: 122/51 (02/24 0500) Pulse Rate: 87 (02/24 0500)  Labs: Recent Labs    04/15/23 4098 04/15/23 1412 04/15/23 1517 04/16/23 0106 04/16/23 0439 04/16/23 0826 04/16/23 1733 04/17/23 0157 04/18/23 0442  HGB  --   --   --    < > 8.6*  --   --  8.2* 7.8*  HCT  --   --   --   --  25.9*  --   --  25.9* 24.0*  PLT  --   --   --   --  206  --   --  177 191  HEPARINUNFRC  --    < >  --    < >  --    < > 0.41 0.44 0.29*  CREATININE  --   --   --   --  1.37*  --   --  1.39*  --   TROPONINIHS 107*  --  85*  --   --   --   --   --   --    < > = values in this interval not displayed.    Estimated Creatinine Clearance: 56.1 mL/min (A) (by C-G formula based on SCr of 1.39 mg/dL (H)).   Medical History: Past Medical History:  Diagnosis Date   Alternating RBBB & LBBB    Ankle swelling 2024   Arthritis    neck   Bladder tumor    CAD (coronary artery disease)    a. 08/2017 STEMI/Cath: LM 80ost/d, LAD 100p, LCX 95 ost/p, RCA 60p, 24m, RPDA 80ost; b. 08/2017 CABG x 3: LIMA->LAD, VG->OM, VG->PDA.   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 08/2017 TEE: EF 30-35%, sept/ant/inf HK, apical AK. 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 12/2017 Echo: EF 45-50%, diff HK. Gr1 DD. Mildly reduced RV fxn. Mild RAE; c. 09/2022 Echo: EF 55-60%, mod LVH, GrI DD, nl RV fxn, mildly dil RA, mild MR.   Hyperlipidemia LDL goal <70    Ischemic cardiomyopathy    a. 08/2017 TEE: EF 30-35%; b. 12/2017 Echo: EF 45-50%; c. 09/2022 Echo: EF 55-60%.   Myocardial infarction  (HCC) 08/27/2017   Shingles    2018 - right side   Vertigo    several months ago    Assessment: Pt is a 78 yo male presenting to admitted 2/11 following cardiac arrested now found with "moderate right-sided arterial clot burden... with occlusive clot in the right upper lobe anterior and posterior segmental arteries, and nonoccluding thrombus in the right lower lobe main artery and superior and posterior basal segmental arteries."  Date/Time HL Rate  Comment 2/21 1412 0.16 900 units/hr Subtherapeutic 2/22 0106 0.32 1200 un/hr Therapeutic x 1 2/22 0826 0.26 1200 units/hr Subtherapeutic 2/22 1733 0.41 1400 units/hr Therapeutic x 1 2/23 0157 0.44 1400 un/hr Therapeutic x 2 2/24 0442 0.29 1400 un/hr Subtherapeutic  Goal of Therapy:  Heparin level 0.3-0.7 units/ml Monitor platelets by anticoagulation protocol: Yes   Plan:  Increase heparin infusion to 1600 unit/hr. Recheck heparin level daily  in 8 hrs after rate change. CBC daily while on heparin.   Otelia Sergeant, PharmD, MBA 04/18/2023 5:20 AM

## 2023-04-18 NOTE — Care Plan (Signed)
 CRITICAL CARE   I spoke with Ms Cory Hess regarding patients medical plan.  She is [name].  We discussed extubation plan and potential for respiratory decline post liberation from mechanical ventilation due to bilateral PE with occlusive RUL component. We discussed available options for therapy including thrombectomy.  There are risk and benefits to this procedure as well as risk of possible cardiac arrest again without it.  After lengthy discussion she reviewed decision making process with remainder of family and we had second conversation.  Family wishes are to proceed with thrombectomy because they do believe the benefit outweighs the risk in this particular case.     Vida Rigger, M.D.  Pulmonary & Critical Care Medicine  [facility]

## 2023-04-18 NOTE — Progress Notes (Signed)
 PHARMACY CONSULT NOTE  Pharmacy Consult for Electrolyte Monitoring and Replacement   Recent Labs: Potassium (mmol/L)  Date Value  04/18/2023 3.9   Magnesium (mg/dL)  Date Value  16/11/9602 2.2   Calcium (mg/dL)  Date Value  54/10/8117 8.3 (L)   Albumin (g/dL)  Date Value  14/78/2956 2.4 (L)  04/01/2021 4.2   Phosphorus (mg/dL)  Date Value  21/30/8657 3.6   Sodium (mmol/L)  Date Value  04/18/2023 142  10/05/2022 141   Assessment: Cory Hess is a 78 yo male who presented to [facility] ED post cardiac arrest. Their downtime was 2 hours. Patient just re-intubated due to hypoxia and found to have "moderate right-sided arterial clot burden as described above, with occlusive clot in the right upper lobe anterior and posterior segmental arteries, and nonoccluding thrombus in the right lower  lobe main artery and superior and posterior basal segmental arteries." Pharmacy has been consult to manage this patient's electrolytes while in the ICU.   Fluids: free water 200 ml q4H  Nutrition: Prosource 60 ml daily + vital high protein 60 ml/hr.  Pertinent Medications: NA   Goal of Therapy:  Electrolytes WNL (K > 4 and Mg > 2 due to cardiac arrest)  Plan:  --Kcl 40 mEq x 1 --F/u with AM labs.   Bettey Costa 04/18/2023 7:34 AM

## 2023-04-18 NOTE — Consult Note (Signed)
 Hospital Consult    Reason for Consult:  Pulmonary embolism  Requesting Physician:  Dr Vida Rigger MD  MRN #:  [id number]  History of Present Illness: This is a 78 y.o. 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 He presented to [facility] ER via EMS from [facility] following a witnessed cardiac arrest. Bystander CPR initiated and EMS arrived on the scene within 30 seconds cardiac rhythm asystole. ROSC initially achieved, however pt in and out of PEA/asystole.   Patient underwent CTA of the chest with PE Protocol. Patient noted to have extensive clot burden with an occlusive clot in the right upper lobe anterior and posterior segmental arteries, and a non occlusive thrombus in the right lower lobe main artery and superior and posterior basal segmental arteries. Patient was extubated 3 days ago and then re-intubated due to hypoxia. Pulmonology endorses the CTA findings are contributing to the patient's hypoxia once extubated. Vascular Surgery was consulted to evaluate patient for possible pulmonary thrombectomy.    Past Medical History:  Diagnosis Date   Alternating RBBB & LBBB    Ankle swelling 2024   Arthritis    neck   Bladder tumor    CAD (coronary artery disease)    a. 08/2017 STEMI/Cath: LM 80ost/d, LAD 100p, LCX 95 ost/p, RCA 60p, 7m, RPDA 80ost; b. 08/2017 CABG x 3: LIMA->LAD, VG->OM, VG->PDA.   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 08/2017 TEE: EF 30-35%, sept/ant/inf HK, apical AK. 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 12/2017 Echo: EF 45-50%, diff HK. Gr1 DD. Mildly reduced RV fxn. Mild RAE; c. 09/2022 Echo: EF 55-60%, mod LVH, GrI DD, nl RV fxn, mildly dil RA, mild MR.    Hyperlipidemia LDL goal <70    Ischemic cardiomyopathy    a. 08/2017 TEE: EF 30-35%; b. 12/2017 Echo: EF 45-50%; c. 09/2022 Echo: EF 55-60%.   Myocardial infarction (HCC) 08/27/2017   Shingles    2018 - right side   Vertigo    several months ago    Past Surgical History:  Procedure Laterality Date   CARDIAC CATHETERIZATION     CATARACT EXTRACTION W/PHACO Left 07/18/2018   Procedure: CATARACT EXTRACTION PHACO AND INTRAOCULAR LENS PLACEMENT (IOC)  LEFT;  Surgeon: Nevada Crane, MD;  Location: [facility];  Service: Ophthalmology;  Laterality: Left;   CATARACT EXTRACTION W/PHACO Right 08/14/2018   Procedure: CATARACT EXTRACTION PHACO AND INTRAOCULAR LENS PLACEMENT (IOC)  RIGHT;  Surgeon: Nevada Crane, MD;  Location: [facility];  Service: Ophthalmology;  Laterality: Right;   CORONARY ARTERY BYPASS GRAFT N/A 08/27/2017   Procedure: CORONARY ARTERY BYPASS GRAFTING (CABG) ON PUMP USING LEFT INTERNAL MAMMARY ARTERY AND LEFT GREATER SAPHENOUS VEIN VIA ENDOVEIN HARVEST;  Surgeon: Alleen Borne, MD;  Location: [facility] OR;  Service: Open Heart Surgery;  Laterality: N/A;   CORONARY/GRAFT ACUTE MI REVASCULARIZATION N/A 08/27/2017   Procedure: Coronary/Graft Acute MI Revascularization;  Surgeon: Iran Ouch, MD;  Location: [facility] INVASIVE CV LAB;  Service: Cardiovascular;  Laterality: N/A;   CYSTOSCOPY W/ RETROGRADES Bilateral 12/25/2018   Procedure: CYSTOSCOPY WITH RETROGRADE PYELOGRAM;  Surgeon: Vanna Scotland, MD;  Location: [facility] ORS;  Service: Urology;  Laterality: Bilateral;   FRACTURE SURGERY Right 1958   arm and left wrist compound fracture, no metal   HERNIA REPAIR Right    inguinial   IR IMAGING GUIDED PORT INSERTION  10/15/2022   LEFT HEART CATH AND CORONARY ANGIOGRAPHY N/A 08/27/2017   Procedure: LEFT HEART CATH AND CORONARY ANGIOGRAPHY;  Surgeon: Iran Ouch, MD;  Location: [facility] INV[facility] CV LAB;  Service: Cardiovascular;  Laterality: N/A;   RIGHT/LEFT HEART CATH AND  CORONARY/GRAFT ANGIOGRAPHY N/A 04/05/2023   Procedure: RIGHT/LEFT HEART CATH AND CORONARY/GRAFT ANGIOGRAPHY;  Surgeon: Yvonne Kendall, MD;  Location: [facility] INV[facility] CV LAB;  Service: Cardiovascular;  Laterality: N/A;   TEMPORARY PACEMAKER N/A 04/05/2023   Procedure: TEMPORARY PACEMAKER;  Surgeon: Yvonne Kendall, MD;  Location: [facility] IN[facility];  Service: Cardiovascular;  Laterality: N/A;   TRANSURETHRAL RESECTION OF BLADDER TUMOR WITH MITOMYCIN-C N/A 12/25/2018   Procedure: TRANSURETHRAL RESECTION OF BLADDER TUMOR WITH Gemcitabine;  Surgeon: Vanna Scotland, MD;  Location: [facility] ORS;  Service: Urology;  Laterality: N/A;    No Known Allergies  Prior to Admission medications   Medication Sig Start Date End Date Taking? Authorizing Provider  acetaminophen (TYLENOL) 650 MG CR tablet Take 650 mg by mouth every 8 (eight) hours as needed for pain.   Yes [provider]  aspirin EC 81 MG EC tablet Take 1 tablet (81 mg total) by mouth daily. 09/03/17  Yes Gold, Wayne E, PA-C  carvedilol (COREG) 12.5 MG tablet TAKE 1 TABLET BY MOUTH TWICE DAILY WITH A MEAL 03/10/23  Yes End, Cristal Deer, MD  lidocaine-prilocaine (EMLA) cream Apply on the port. 30 -45 min  prior to port access. 10/08/22  Yes Earna Coder, MD  losartan (COZAAR) 100 MG tablet Take 1 tablet by mouth once daily 03/10/23  Yes End, Cristal Deer, MD  Multiple Vitamin (MULTIVITAMIN WITH MINERALS) TABS tablet Take 1 tablet by mouth daily. One a Day over 50/ lunch   Yes [provider]  nitroGLYCERIN (NITROSTAT) 0.4 MG SL tablet DISSOLVE ONE TABLET UNDER THE TONGUE EVERY 5 MINUTES AS NEEDED FOR CHEST PAIN.  DO NOT EXCEED A TOTAL OF 3 DOSES IN 15 MINUTES 03/24/23  Yes Creig Hines, NP  OLANZapine (ZYPREXA) 5 MG tablet Take one pill at night. 11/03/22  Yes Earna Coder, MD  ondansetron (ZOFRAN) 8 MG tablet One pill every 8 hours as needed for nausea/vomitting. 10/08/22  Yes Earna Coder, MD   polyethylene glycol (MIRALAX) 17 g packet Take 17 g by mouth daily. 11/07/22  Yes Romeo Apple, Myah A, PA-C  prochlorperazine (COMPAZINE) 10 MG tablet Take 1 tablet (10 mg total) by mouth every 6 (six) hours as needed for nausea or vomiting. 10/08/22  Yes Earna Coder, MD  senna-docusate (SENOKOT-S) 8.6-50 MG tablet Take 1 tablet by mouth daily. 11/07/22  Yes Romeo Apple, Myah A, PA-C  atorvastatin (LIPITOR) 80 MG tablet Take 1 tablet by mouth once daily Patient not taking: Reported on 04/07/2023 02/08/23   Creig Hines, NP  torsemide (DEMADEX) 20 MG tablet Take 1 tablet (20 mg total) by mouth daily as needed (swelling and weight gain). 11/25/22 02/23/23  Creig Hines, NP    Social History   Socioeconomic History   Marital status: Single    Spouse name: Not on file   Number of children: Not on file   Years of education: Not on file   Highest education level: Not on file  Occupational History   Occupation: supervision, Research scientist (life sciences)    Comment:  retired  Tobacco Use   Smoking status: Former    Current packs/day: 0.00    Types: Cigarettes    Quit date: 2000    Years since quitting: 25.1   Smokeless tobacco: Current    Types: Snuff  Vaping Use   Vaping status: Never Used  Substance and Sexual Activity   Alcohol use: Not Currently   Drug use: Not Currently   Sexual activity: Not on file  Other Topics Concern   Not on file  Social History Narrative   Lives alone   Social Drivers of Health   Financial Resource Strain: Low Risk  (11/06/2018)   Overall Financial Resource Strain (CARDIA)    Difficulty of Paying Living Expenses: Not hard at all  Food Insecurity: Patient Unable To Answer (04/06/2023)   Hunger Vital Sign    Worried About Running Out of Food in the Last Year: Patient unable to answer    Ran Out of Food in the Last Year: Patient unable to answer  Transportation Needs: Patient Unable To Answer (04/06/2023)   PRAPARE - Transportation     Lack of Transportation (Medical): Patient unable to answer    Lack of Transportation (Non-Medical): Patient unable to answer  Physical Activity: Inactive (11/06/2018)   Exercise Vital Sign    Days of Exercise per Week: 0 days    Minutes of Exercise per Session: 0 min  Stress: No Stress Concern Present (11/06/2018)   [employer] of Occupational Health - Occupational Stress Questionnaire    Feeling of Stress : Not at all  Social Connections: Patient Unable To Answer (04/06/2023)   Social Connection and Isolation Panel [NHANES]    Frequency of Communication with Friends and Family: Patient unable to answer    Frequency of Social Gatherings with Friends and Family: Patient unable to answer    Attends Religious Services: Patient unable to answer    Active Member of Clubs or Organizations: Patient unable to answer    Attends Banker Meetings: Patient unable to answer    Marital Status: Patient unable to answer  Intimate Partner Violence: Unknown (04/06/2023)   Humiliation, Afraid, Rape, and Kick questionnaire    Fear of Current or Ex-Partner: Patient unable to answer    Emotionally Abused: Patient unable to answer    Physically Abused: No    Sexually Abused: Patient unable to answer     Family History  Problem Relation Age of Onset   Heart failure Mother    Lung disease Father    Stroke Father    Cervical cancer Maternal Grandmother     ROS: Otherwise negative unless mentioned in HPI  Physical Examination  Vitals:   04/18/23 0900 04/18/23 1000  BP: (!) 114/54 125/62  Pulse: 84 86  Resp: 16 18  Temp:    SpO2: 99% 100%   Body mass index is 30.85 kg/m.  General:  WDWN in NAD Gait: Not observed HENT: WNL, normocephalic Pulmonary: Patient ventilated PRVC at 40% O2, Peep of 5, Respiratory Rate of 15 Tidal Volume of 500 ml's. Decreased Breath sounds on the right with rales in bilateral upper lobes.  Cardiac: regular, without  Murmurs, rubs or gallops; without  carotid bruits Abdomen: Positive bowel sounds, soft, NT/ND, no masses Skin: without rashes Vascular Exam/Pulses: Palpable pulses throughout. Extremities warm to the touch Extremities: without ischemic changes, without Gangrene , without cellulitis; without open wounds;  Musculoskeletal: no muscle wasting or atrophy  Neurologic: Patient Intubated and Sedated.  Psychiatric:  The pt has  been intubated and sedated.  No affect. Lymph:  Unremarkable  CBC    Component Value Date/Time   WBC 6.8 04/18/2023 0442   RBC 2.37 (L) 04/18/2023 0442   HGB 7.8 (L) 04/18/2023 0442   HGB 10.3 (L) 01/14/2023 0927   HGB 14.6 04/01/2021 0933   HCT 24.0 (L) 04/18/2023 0442   HCT 43.4 04/01/2021 0933   PLT 191 04/18/2023 0442   PLT 115 (L) 01/14/2023 0927   PLT 151 04/01/2021 0933   MCV 101.3 (H) 04/18/2023 0442   MCV 92 04/01/2021 0933   MCH 32.9 04/18/2023 0442   MCHC 32.5 04/18/2023 0442   RDW 13.3 04/18/2023 0442   RDW 12.3 04/01/2021 0933   LYMPHSABS 1.4 04/05/2023 1743   MONOABS 1.7 (H) 04/05/2023 1743   EOSABS 0.1 04/05/2023 1743   BASOSABS 0.1 04/05/2023 1743    BMET    Component Value Date/Time   NA 142 04/18/2023 0442   NA 141 10/05/2022 1629   K 3.9 04/18/2023 0442   CL 112 (H) 04/18/2023 0442   CO2 21 (L) 04/18/2023 0442   GLUCOSE 92 04/18/2023 0442   BUN 43 (H) 04/18/2023 0442   BUN 11 10/05/2022 1629   CREATININE 1.23 04/18/2023 0442   CREATININE 1.56 (H) 01/14/2023 0927   CALCIUM 8.3 (L) 04/18/2023 0442   GFRNONAA >60 04/18/2023 0442   GFRNONAA 46 (L) 01/14/2023 0927   GFRAA >60 12/21/2018 0937    COAGS: Lab Results  Component Value Date   INR 1.4 (H) 04/05/2023   INR 1.34 08/28/2017   INR 0.97 08/27/2017     Non-Invasive Vascular Imaging:   EXAM:04/15/23 CT ANGIOGRAPHY CHEST WITH CONTRAST   TECHNIQUE: Multidetector CT imaging of the chest was performed using the standard protocol during bolus administration of intravenous contrast. Multiplanar CT image  reconstructions and MIPs were obtained to evaluate the vascular anatomy.   RADIATION DOSE REDUCTION: This exam was performed according to the departmental dose-optimization program which includes automated exposure control, adjustment of the mA and/or kV according to patient size and/or use of iterative reconstruction technique.   CONTRAST:  75mL OMNIPAQUE IOHEXOL 350 MG/ML SOLN   COMPARISON:  Portable chest today, portable chest yesterday, portable chest 04/10/2023.   Also PET-CT 09/22/2022.   FINDINGS: Cardiovascular: There is a moderate right-sided arterial clot burden.   This consists of a nonocclusive clot which straddles the arterial bifurcation, with occlusive clot continuing into the right upper lobe anterior and posterior segmental and several downstream subsegmental arteries, with a small thrombus extending partially into the apical segmental artery; nonoccluding thrombus extends into the right lower lobe main artery and into the superior segmental artery and the posterior basal segmental artery.   A few superior segment downstream small branches may also harbor thrombus, but the basilar downstream branches opacify well.   No clot seen in the right middle lobe on the left. There is no increase in the RV/LV ratio, but there is mild contrast reflux into the IVC.   The right main pulmonary artery is more prominent than previously measuring 3 cm consistent with arterial congestion, but the other pulmonary arteries are nondilated although are near the upper limit of normal.   There are old sternotomy and CABG changes and mild cardiomegaly with a left chamber predominance.   There is no venous dilatation. Native coronary arteries demonstrate patchy calcifications.   There is no pericardial effusion. There is patchy aortic calcific plaque without aneurysm, stenosis or dissection, scattered calcification in the great vessels.  There is a normal variant two-vessel  aortic arch with a short-segment brachiobicarotid trunk.   Right IJ port catheter and left PICC both terminate at about the superior cavoatrial junction.   Mediastinum/Nodes: ETT in place with tip 1.8 cm from the carina.   NGT is well into the stomach the tip tenting the far wall of the proximal gastric body.   The trachea and esophagus are otherwise unremarkable. The main bronchi are clear.   There is no thyroid nodule. No enlarged intrathoracic or axillary lymph nodes.   Lungs/Pleura: There are small layering pleural effusions. Interlobular septal thickening in the lung apices consistent with interstitial edema.   There are patchy ground-glass opacities in the lung apices, scattered bilateral perihilar interstitial haziness, most likely due to ground-glass edema.   In the lower lobes there is more confluent and dense consolidation posteriorly, which be due to consolidated alveolar edema, pneumonia or aspiration.   The rest of the lungs appear generally clear. There is no pneumothorax.   Upper Abdomen: There is hepatic steatosis. 1.7 cm rim calcified stone in the proximal gallbladder without wall thickening.   2 mm nonobstructing caliceal stone again in the midpole left kidney.   No acute upper abdominal findings. Moderate abdominal aortic atherosclerosis with mixed plaque, with some of the soft plaque appearing ulcerative in the suprarenal aorta without penetrating ulcers.   Musculoskeletal: There are slightly displaced recent fractures of the left anterior second through sixth and right anterior fourth through seventh ribs, almost certainly CPR related.   There is no displaced sternal fracture, no mediastinal hematoma.   No further regional skeletal fractures. Osteopenia and degenerative change of the spine.   Review of the MIP images confirms the above findings.   IMPRESSION: 1. Moderate right-sided arterial clot burden as described above, with occlusive  clot in the right upper lobe anterior and posterior segmental arteries, and nonoccluding thrombus in the right lower lobe main artery and superior and posterior basal segmental arteries. 2. No increase in the RV/LV ratio, but there is mild contrast reflux into the IVC, and the right main pulmonary artery is more prominent than previously consistent with arterial congestion. 3. Cardiomegaly with old sternotomy and CABG changes. 4. Small pleural effusions with interstitial and ground-glass edema in the upper lobes. 5. More confluent and dense consolidation in the lower lobes posteriorly, which could be due to consolidated alveolar edema, pneumonia or aspiration. 6. Aortic and coronary artery atherosclerosis. 7. Hepatic steatosis. 8. Cholelithiasis. 9. Nonobstructing left renal stone. 10. Slightly displaced recent fractures of the left anterior second through sixth and right anterior fourth through seventh ribs, almost certainly CPR related. No pneumothorax. No mediastinal hematoma. 11. Support apparatus as above. 12. Critical Value/emergent results were called by telephone at the time of interpretation on 04/15/2023 at 5:14 am to provider BRITTON RUST-CHESTER , who verbally acknowledged these results.  Statin:  Yes.   Beta Blocker:  Yes.   Aspirin:  Yes.   ACEI:  No. ARB:  Yes.   CCB use:  No Other antiplatelets/anticoagulants:  No.    ASSESSMENT/PLAN: This is a 78 y.o. male who has a significant medical history presented to [facility] emergency department via EMS from urgent care following a witnessed cardiac arrest.  According to the records patient underwent CPR during multiple arrests including PEA/asystole and complete heart block.  Estimated downtime was 2 hours.  On 04/15/2023 patient underwent CTA of the chest with PE protocol.  He is noted to have extensive clot burden in the  right lung.  Some of this thrombus is occluding his right upper lobe anterior and posterior segmental  arteries as well as nonoccluding thrombus in the right lower lobe main artery.  Due to sedation and ventilation there is not appear to be any RV to LV ratio but there was mild contrast reflux into the IVC and the right main pulmonary artery that is consistent with arterial congestion.  The ICU team attempted to extubate the patient which they did successfully but the following day was reintubated due to severe hypoxemia.  This reintubation with hypoxemia is most likely related to both his occlusive and nonocclusive thrombus to his right lung.  After reviewing the CTA scans with Dr. Festus Barren MD vascular surgery believes a pulmonary thrombectomy at this time may benefit the patient.  Removing as much of the clot burden as possible may allow the patient to be extubated without being reintubated and hopefully resolve some of his hypoxemia.  It is noted that this is a very high risk procedure for this patient due to his prior cardiac arrest and noted CVA.  The risks include cardiac arrest without possible ROSC, pulmonary bleeding due to thrombectomy, or CVA from the possible migration of thrombus.  I had a long conversation with Dr. Vida Rigger MD pulmonary intensivist who is covering this patient's case.  We discussed the nature of this being high risk balanced by the need of the procedure for extubation from the ventilator.  We both agree at this time it would be beneficial to proceed with a pulmonary thrombectomy.  ICU intensivist plans on calling the patient's sister on record who is directing the patient's care, to obtain consent for the procedure.  Vascular surgery plans on taking the patient to the vascular lab later this afternoon on 04/18/2023 for a pulmonary thrombectomy of the patient's right lung.   -I discussed this case in detail with Dr. Festus Barren MD and Dr Vida Rigger MD and they agree with the plan.    Marcie [facility] 04/18/2023 10:50 AM

## 2023-04-18 NOTE — Op Note (Signed)
 [facility]  Percutaneous Study/Intervention Procedural Note   Date of Surgery: 04/18/2023,3:34 PM  Surgeon: Festus Barren  Pre-operative Diagnosis: Symptomatic pulmonary emboli  Post-operative diagnosis:  Same  Procedure(s) Performed:  1.  Contrast injection right heart  2.  Mechanical thrombectomy using the penumbra CAT 12 catheter to the right lower lobe, middle lobe, and upper lobe pulmonary arteries  3.  Selective catheter placement left main pulmonary  4.  Selective catheter placement right lower lobe, middle lobe, and upper lobe pulmonary artery     Anesthesia: Conscious sedation was administered under my direct supervision by the interventional radiology RN. His existing propofol and Fentanyl drips were utilized. Continuous ECG, pulse oximetry and blood pressure was monitored throughout the entire procedure.  Conscious sedation was administered for a total of 35 minutes  EBL: 200 cc  Sheath: 12 Fr right femoral vein  Contrast: 30 cc   Fluoroscopy Time: 6.1 minutes  Indications:  Patient presents with pulmonary emboli. The patient is symptomatic with hypoxemia and dyspnea on exertion.  There is evidence of right heart strain on the CT angiogram. The patient is otherwise a good candidate for intervention and even the long-term benefits pulmonary angiography with thrombolysis is offered. The risks and benefits are reviewed long-term benefits are discussed. All questions are answered patient agrees to proceed.  Procedure:  Eliav Mechling a 78 y.o. male who was identified and appropriate procedural time out was performed.  The patient was then placed supine on the table and prepped and draped in the usual sterile fashion.  Ultrasound was used to evaluate the right common femoral vein.  It was patent, as it was echolucent and compressible.  A digital ultrasound image was acquired for the permanent record.  A Seldinger needle was used to access the right common  femoral vein under direct ultrasound guidance.  A 0.035 J wire was advanced without resistance and a 6Fr sheath was placed. A proglide device was placed in a preclose fashion and then upsized to a 12 [place] sheath.    The Advantage wire and pigtail catheter were then negotiated into the right atrium and bolus injection of contrast was utilized to demonstrate the right ventricle and the pulmonary artery outflow. The Advantage wire and catheter were then negotiated into the the pulmonary arteries.  The left main pulmonary artery was cannulated and selective imaging was performed.  No significant thrombus burden was seen in the left lung.  I then transitioned to the right side with the advantage wire and catheter and first cannulated the right lower lobe and then the right middle and upper lobes.  This demonstrated extensive thrombus burden throughout the right side particularly in the middle and upper lobe pulmonary arteries.  The right lower lobe had a smaller amount of thrombus but thrombus was present.  3000 Units of heparin was then given and allowed to circulate.  The Penumbra Cat 12 catheter was then advanced up into the pulmonary vasculature. The right lung was addressed with pulmonary thrombectomy. Catheter was negotiated into the right lower lobe and mechanical thrombectomy was performed with the help of the separator. Follow-up imaging demonstrated a good result and therefore the catheter was renegotiated into the right middle lobe pulmonary artery and again mechanical thrombectomy was performed.  I was also able to transition into the right upper lobe pulmonary artery and thrombectomy was performed most extensively in the middle and upper lobe pulmonary arteries with the majority of time spent there.  Passes were made with  both the Penumbra catheter itself as well as introducing the separator. Follow-up imaging was then performed.  Marked improvement was seen on completion imaging on the right  side.   After review these images wires were reintroduced and the catheter is removed. Then, the sheath is then pulled, the proglide device is secured, a Monocryl skin suture was placed and pressure is held. A sterile dressing is placed.    Findings:   Right heart imaging:  Right atrium and right ventricle and the pulmonary outflow tract appears somewhat dilated  Right lung:  This demonstrated extensive thrombus burden throughout the right side particularly in the middle and upper lobe pulmonary arteries.  The right lower lobe had a smaller amount of thrombus but thrombus was present.  Left lung:  No significant thrombus burden was seen in the left lung.    Disposition: Patient was taken to the recovery room in stable condition having tolerated the procedure well.  Festus Barren 04/18/2023,3:34 PM

## 2023-04-18 NOTE — Progress Notes (Signed)
 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  He presented to [facility] ER via EMS from [facility] following a witnessed cardiac arrest.  Bystander CPR initiated and EMS arrived on the scene within 30 seconds cardiac rhythm asystole.  ROSC initially achieved, however pt in and out of PEA/asystole.    Pt recently evaluated in the ER on 02/4 with urinary urgency and frequency that started 5 days prior to that presentation.  Diagnosed with a UTI and prescribed Bactrim DS twice daily for 10 days.  However, symptoms persisted prompting [facility] visit today.  ED Course  Upon arrival to the ER pt on NRB with pulse.  It was reported during chest compressions the pt began to fight and pulling at medical devices.  While in the the ER waiting room the pt cardiac arrested again requiring ACLS protocol and mechanical intubation.  Pt with varied cardiac rhythm during cardiac arrest PEA/asystole/complete heart block.  He required transcutaneously pacing.  EKG showed apparent sinus rhythm with complete heart block and ventricular escape rhythm with LBBB morphology.  Total downtime estimated 2hrs from out of hospital cardiac arrest and inpatient cardiac arrest.  Pt required epinephrine gtt due to bradycardia and hypotension.  Pt taken emergently to the cardiac cath lab for temporary transvenous pacemaker placement, emergent cardiac catheterization, and swan-ganz catheter placement.   Cardiac cath revealed severe native CAD with patent LIMA-LAD and SVG-OM. SVG-RCA is occluded, but did not appear  acute and unlikely the inciting factor for today's cardiac arrest.  Per cardiology if pt makes a meaningful recovery he may require revascularization of the RCA.  Pt admitted to the ICU post cardiac cath PCCM consulted to asume care.    Hospital course: See significant events for more detail Overnight on 2/20-2/21 patient acutely decompensated with sudden bradycardia- pause and hypoxia into the 60's. Patient emergently intubated and place on mechanical ventilatory support with resolution of baseline rhythm. CT angio post intubation revealed moderate Right Upper & lower PE without heart strain.  Most recent Significant labs: (Labs/ Imaging personally reviewed)  Chemistry: Na+:149, K+: 3.3, BUN/Cr.: 49/ 1.48, Serum CO2/ AG: 21/11 Hematology: WBC: 8.3, Hgb: 9.8,  Troponin: 74, BNP: 498.8, Lactic: 2.8  ABG: 7.38/ 40/175/23.7 CXR 04/15/23: Patchy LLL retrocardiac opacity, suspicious for pneumonia CT head wo contrast 04/15/23: Small right frontal lobe cortical infarcts this month on MRI are occult by CT. No new intracranial abnormality identified. Ct angio chest 04/15/23:  Moderate right-sided arterial clot burden as described above, with occlusive clot in the right upper lobe anterior and posterior segmental arteries, and nonoccluding thrombus in the right lower lobe main artery and superior and posterior basal segmental arteries. 2. No increase in the RV/LV ratio, but there is mild contrast reflux into the IVC, and the right main pulmonary artery is more prominent than previously consistent with arterial congestion. Cardiomegaly with old sternotomy and CABG changes. 4. Small pleural effusions with interstitial and ground-glass edema in the upper lobes. 5. More confluent and dense consolidation in the lower lobes posteriorly, which could be due to consolidated alveolar edema, pneumonia or aspiration.  6. Aortic and coronary artery atherosclerosis. 7. Hepatic steatosis. 8. Cholelithiasis. 9. Nonobstructing  left renal stone. 10. Slightly displaced recent fractures of the left anterior second through sixth and right anterior fourth through seventh ribs, almost certainly CPR related. No pneumothorax. No mediastinal hematoma.  PCCM re-consulted due to emergent acute hypoxic respiratory failure secondary to moderate right upper & lower PE requiring emergent intubation and mechanical ventilatory support.  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  Worsening AKI and mild hyperkalemia, given shifting measures.  Low threshold for Nephrology consultation. 02/13: Weaning down vasopressors, Vasopressin off, Levo @ 7, Epi @ 6.  On minimal vent support.  Renal function and hyperkalemia slowly improving.  Advanced Heart Failure diuresing with 40 mg IV Lasix x1 dose for CVP of 15.  Perform WUA. 02/14: Weaned off vasopressors  On minimal vent support, failed WUA due to        severe hypertension and increased WOB.  On WUA only opening eyes, not        following commands.  MRI Brain revealed 2 small acute infarctions affecting the        cortical brain in theright frontal region.Theone Murdoch, temporary pacer and right        femoral venous sheath removed.  AKI slowly improving.  Heparin gtt discontinued        given low suspicion for PE.  To have PICC placed per Heart Failure team request. 02/15: Pt remains mechanically intubated.  All sedation off  brainstem reflexes intact and opens eyes to voice.  Mentation precluding extubation.  Neurology consulted 02/16: Overnight pt developed worsening acute respiratory failure with accessory muscle use, tachycardia, and hypoxia requiring bagging via BVM, therefore propofol gtt restarted.  Neurology recommending MRA Head/Neck and EEG once stable to transfer.  Performing WUA and possible SBT today  2/17: No significant events overnight, mental status still lagging. stroke work up pending per neuro recs 2/18: No significant events overnight. EEG without seizures, CTH negative, and Neck as below. Neuro following for neuro prognostication 2/19: following commands, extubated 2/20: patient confused and agitated, no SOB on 4 L [place]. Overnight patient became bradycardic with pauses, while maintaining a pulse and hypoxic in the 60's. Emergently intubated and placed back on mechanical ventilatory support with return to normal rhythm. 2/22 severe hypoxia, b/l PE 2/23 severe hypoxia b/l PE 04/18/23-not following verbal communication, on FIO2 40%.  Secretions are moderate, aspirate in process from ETT. Vascular consult today.  For thrombectomy today after review of plan with family.   Interim History / Subjective:  Remains critically ill Remains intubated Severe hypoxia Requires VENT support for survival  Vent Mode: PRVC FiO2 (%):  [50 %] 50 % Set Rate:  [15 bmp-155 bmp] 15 bmp Vt Set:  [500 mL] 500 mL PEEP:  [5 cmH20] 5 cmH20 Plateau Pressure:  [14 cmH20-18 cmH20] 14 cmH20  Objective   Blood pressure (!) 123/55, pulse 94, temperature 99 F (37.2 C), temperature source Axillary, resp. rate 20, height 6' 0.01" (1.829 m),  weight 103.2 kg, SpO2 97%.      Intake/Output Summary (Last 24 hours) at 04/18/2023 0823 Last data filed at 04/18/2023 0750 Gross per 24 hour  Intake 3795.72 ml  Output 2475 ml  Net 1320.72 ml   Filed Weights   04/16/23 0443 04/17/23 0411 04/18/23 0400  Weight: 104.2 kg 103.3 kg 103.2  kg     REVIEW OF SYSTEMS  PATIENT IS UNABLE TO PROVIDE COMPLETE REVIEW OF SYSTEMS DUE TO SEVERE CRITICAL ILLNESS   PHYSICAL EXAMINATION:  GENERAL:critically ill appearing, +resp distress EYES: Pupils equal, round, reactive to light.  No scleral icterus.  MOUTH: Moist mucosal membrane. INTUBATED NECK: Supple.  PULMONARY: Lungs clear to auscultation, +rhonchi, +wheezing CARDIOVASCULAR: S1 and S2.  Regular rate and rhythm GASTROINTESTINAL: Soft, nontender, -distended. Positive bowel sounds.  MUSCULOSKELETAL: No swelling, clubbing, or edema.  NEUROLOGIC: sedated SKIN:normal, warm to touch, Capillary refill delayed  Pulses present bilaterally   Assessment & Plan:  78 yo white male admitted for acute Cardiac arrest CAD with Complete heart block s/p temporary transvenous pacemaker placement (removed 02/15) Junctional tachycardia  With  Acute on chronic HFrEF with Cardiogenic shock leading to acute severe hypoxic resp failure, s/p extubation, then with acute PE leading to severe hypoxia and needing re-intubation  Severe ACUTE Hypoxic and Hypercapnic Respiratory Failure -continue Mechanical Ventilator support -Wean Fio2 and PEEP as tolerated -VAP/VENT bundle implementation - Wean PEEP & FiO2 as tolerated, maintain SpO2 > 88% - Head of bed elevated 30 degrees, VAP protocol in place - Plateau pressures less than 30 cm H20  - Intermittent chest x-ray & ABG PRN - Ensure adequate pulmonary hygiene  Unable to wean off vent today severe hypoxia   NEUROLOGY ACUTE METABOLIC ENCEPHALOPATHY -need for sedation -Goal RASS -2 to -3 MR Brain 02/14: 2 small acute infarctions affecting the cortical brain in the right frontal region.  Mild chronic small-vessel ischemic change of the cerebral hemispheric white matter with a few old small cortical infarctions scattered about the right hemisphere suggesting chronic/recurrent micro embolic infarctions. Recommend carotid evaluation. US carotid showing <  50% stenosis. MRA Head/Neck with small infarcts, and EEG without seizures. Repeat CTH no new acute intracranial abnormality - Neurology consulted appreciate input  - Stroke work up per Neuro recs - Continue ASA 81mg  daily + plavix 75mg  daily x21 days f/b ASA 81mg  daily monotherapy after that  - Continue atorvastatin 80mg  daily     Moderate Right upper & Lower Pulmonary embolism  - Systemic Heparin drip per pharmacy protocol - Follow up BLE dopplers to assess for DVT - Echocardiogram ordered  - consider Vascular consult if IVC filter/ thrombectomy is warranted   ACUTE SYSTOLIC CARDIAC FAILURE-  Cardiac arrest  Complete heart block s/p temporary transvenous pacemaker placement (removed 02/15) Junctional tachycardia,CAD HTN Acute on chronic HFrEF  Cardiac Cath 02/11: severe native CAD with patent LIMA-LAD and SVG-OM. SVG-RCA is occluded, but did not appear acute and unlikely the inciting factor for today's cardiac arrest Echocardiogram 04/07/23:  LVEF 40-45%, mild LVH, Grade I DD, RV systolic function not well visualized, RV size normal - diuresis with lasix as hemodynamics and renal function allow - cardiology following, appreciate input  ACUTE KIDNEY INJURY/Renal Failure -continue Foley Catheter-assess need -Avoid nephrotoxic agents -Follow urine output, BMP -Ensure adequate renal perfusion, optimize oxygenation -Renal dose medications   Intake/Output Summary (Last 24 hours) at 04/18/2023 0823 Last data filed at 04/18/2023 0750 Gross per 24 hour  Intake 3795.72 ml  Output 2475 ml  Net 1320.72 ml  Latest Ref Rng & Units 04/18/2023    4:42 AM 04/17/2023    1:57 AM 04/16/2023    4:39 AM  BMP  Glucose 70 - 99 mg/dL 92  97  99   BUN 8 - 23 mg/dL 43  43  41   Creatinine 0.61 - 1.24 mg/dL 1.61  0.96  0.45   Sodium 135 - 145 mmol/L 142  143  144   Potassium 3.5 - 5.1 mmol/L 3.9  3.5  3.6   Chloride 98 - 111 mmol/L 112  113  114   CO2 22 - 32 mmol/L 21  21  22    Calcium 8.9  - 10.3 mg/dL 8.3  8.3  8.3       ENDO - ICU hypoglycemic\Hyperglycemia protocol -check FSBS per protocol   GI GI PROPHYLAXIS as indicated NUTRITIONAL STATUS DIET-->TF's as tolerated Constipation protocol as indicated   ELECTROLYTES -follow labs as needed -replace as needed -pharmacy consultation and following  RESTRICTIVE TRANSFUSION PROTOCOL TRANSFUSION  IF HGB<7  or ACTIVE BLEEDING OR DX of ACUTE CORONARY SYNDROMES    SEVERE HYPOXIA DIFFICULT TO WEAN    Best Practice (right click and "Reselect all SmartList Selections" daily)  Diet/type: NPO DVT prophylaxis LMWH Pressure ulcer(s): N/A GI prophylaxis: H2B Lines: Portacath present on admission and Left Upper Extremity PICC and still needed Foley:  Yes, and it is still needed Code Status:  DNR with pre-interventions Last date of multidisciplinary goals of care discussion [04/14/23]  2/22: family updated about change in clinical status and emergent intubation requiring mechanical ventilator support. Labs   CBC: Recent Labs  Lab 04/14/23 0432 04/15/23 0337 04/16/23 0439 04/17/23 0157 04/18/23 0442  WBC 7.0 8.3 13.2* 8.9 6.8  HGB 9.7* 9.8* 8.6* 8.2* 7.8*  HCT 30.0* 29.7* 25.9* 25.9* 24.0*  MCV 102.0* 100.3* 100.0 103.6* 101.3*  PLT 194 226 206 177 191    Basic Metabolic Panel: Recent Labs  Lab 04/12/23 0433 04/12/23 2008 04/13/23 0333 04/14/23 0432 04/15/23 0337 04/15/23 0350 04/16/23 0439 04/17/23 0157 04/18/23 0442  NA 145  --  146* 150*  --  149* 144 143 142  K 3.2*   < > 3.9 3.3*  --  3.3* 3.6 3.5 3.9  CL 112*  --  114* 115*  --  117* 114* 113* 112*  CO2 24  --  23 24  --  21* 22 21* 21*  GLUCOSE 106*  --  131* 100*  --  174* 99 97 92  BUN 51*  --  60* 52*  --  49* 41* 43* 43*  CREATININE 1.34*  --  1.42* 1.37*  --  1.48* 1.37* 1.39* 1.23  CALCIUM 8.4*  --  8.7* 8.6*  --  8.7* 8.3* 8.3* 8.3*  MG 2.2  --  2.2 2.2 2.4  --  2.2  --   --   PHOS 3.6  --  3.5 3.1  --  5.6* 4.0 3.6  --    < > =  values in this interval not displayed.   GFR: Estimated Creatinine Clearance: 63.5 mL/min (by C-G formula based on SCr of 1.23 mg/dL). Recent Labs  Lab 04/15/23 [phone] 04/15/23 0634 04/15/23 0637 04/16/23 0439 04/17/23 0157 04/18/23 0442  PROCALCITON  --   --  0.22  --   --   --   WBC 8.3  --   --  13.2* 8.9 6.8  LATICACIDVEN 2.8* 1.1  --   --   --   --  Liver Function Tests: Recent Labs  Lab 04/13/23 0333 04/14/23 0432 04/15/23 0350 04/16/23 0439 04/17/23 0157  ALBUMIN 2.6* 2.8* 2.6* 2.5* 2.4*   No results for input(s): "LIPASE", "AMYLASE" in the last 168 hours. No results for input(s): "AMMONIA" in the last 168 hours.  ABG    Component Value Date/Time   PHART 7.38 04/15/2023 0515   PCO2ART 40 04/15/2023 0515   PO2ART 175 (H) 04/15/2023 0515   HCO3 23.7 04/15/2023 0515   TCO2 33 (H) 04/05/2023 1459   ACIDBASEDEF 1.3 04/15/2023 0515   O2SAT 100 04/15/2023 0515     Coagulation Profile: No results for input(s): "INR", "PROTIME" in the last 168 hours.   Cardiac Enzymes: No results for input(s): "CKTOTAL", "CKMB", "CKMBINDEX", "TROPONINI" in the last 168 hours.  HbA1C: Hgb A1c MFr Bld  Date/Time Value Ref Range Status  04/05/2023 07:38 PM 5.9 (H) 4.8 - 5.6 % Final    Comment:    (NOTE) Pre diabetes:          5.7%-6.4%  Diabetes:              >6.4%  Glycemic control for   <7.0% adults with diabetes   08/28/2017 02:27 AM 5.6 4.8 - 5.6 % Final    Comment:    (NOTE) Pre diabetes:          5.7%-6.4% Diabetes:              >6.4% Glycemic control for   <7.0% adults with diabetes     CBG: Recent Labs  Lab 04/17/23 1547 04/17/23 1926 04/18/23 0014 04/18/23 0351 04/18/23 0745  GLUCAP 82 81 80 87 81    Review of Systems:   Unable to assess pt mechanically intubated   Past Medical History:  He,  has a past medical history of Alternating RBBB & LBBB, Ankle swelling (2024), Arthritis, Bladder tumor, CAD (coronary artery disease), Cancer (HCC),  Cancer of base of tongue (HCC), Essential hypertension, 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   Surgical History:   Past Surgical History:  Procedure Laterality Date   CARDIAC CATHETERIZATION     CATARACT EXTRACTION W/PHACO Left 07/18/2018   Procedure: CATARACT EXTRACTION PHACO AND INTRAOCULAR LENS PLACEMENT (IOC)  LEFT;  Surgeon: Nevada Crane, MD;  Location: [facility];  Service: Ophthalmology;  Laterality: Left;   CATARACT EXTRACTION W/PHACO Right 08/14/2018   Procedure: CATARACT EXTRACTION PHACO AND INTRAOCULAR LENS PLACEMENT (IOC)  RIGHT;  Surgeon: Nevada Crane, MD;  Location: [facility];  Service: Ophthalmology;  Laterality: Right;   CORONARY ARTERY BYPASS GRAFT N/A 08/27/2017   Procedure: CORONARY ARTERY BYPASS GRAFTING (CABG) ON PUMP USING LEFT INTERNAL MAMMARY ARTERY AND LEFT GREATER SAPHENOUS VEIN VIA ENDOVEIN HARVEST;  Surgeon: Alleen Borne, MD;  Location: [facility];  Service: Open Heart Surgery;  Laterality: N/A;   CORONARY/GRAFT ACUTE MI REVASCULARIZATION N/A 08/27/2017   Procedure: Coronary/Graft Acute MI Revascularization;  Surgeon: Iran Ouch, MD;  Location: [facility];  Service: Cardiovascular;  Laterality: N/A;   CYSTOSCOPY W/ RETROGRADES Bilateral 12/25/2018   Procedure: CYSTOSCOPY WITH RETROGRADE PYELOGRAM;  Surgeon: Vanna Scotland, MD;  Location: [facility];  Service: Urology;  Laterality: Bilateral;   FRACTURE SURGERY Right 1958   arm and left wrist compound fracture, no metal   HERNIA REPAIR Right    inguinial   IR IMAGING GUIDED PORT INSERTION  10/15/2022   LEFT HEART CATH  AND CORONARY ANGIOGRAPHY N/A 08/27/2017   Procedure: LEFT HEART CATH AND CORONARY ANGIOGRAPHY;  Surgeon: Iran Ouch, MD;  Location: [facility];  Service: Cardiovascular;  Laterality: N/A;   RIGHT/LEFT HEART  CATH AND CORONARY/GRAFT ANGIOGRAPHY N/A 04/05/2023   Procedure: RIGHT/LEFT HEART CATH AND CORONARY/GRAFT ANGIOGRAPHY;  Surgeon: Yvonne Kendall, MD;  Location: [facility];  Service: Cardiovascular;  Laterality: N/A;   TEMPORARY PACEMAKER N/A 04/05/2023   Procedure: TEMPORARY PACEMAKER;  Surgeon: Yvonne Kendall, MD;  Location: [facility];  Service: Cardiovascular;  Laterality: N/A;   TRANSURETHRAL RESECTION OF BLADDER TUMOR WITH MITOMYCIN-C N/A 12/25/2018   Procedure: TRANSURETHRAL RESECTION OF BLADDER TUMOR WITH Gemcitabine;  Surgeon: Vanna Scotland, MD;  Location: [facility];  Service: Urology;  Laterality: N/A;       DVT/GI PRX  assessed I Assessed the need for Labs I Assessed the need for Foley I Assessed the need for Central Venous Line Family Discussion when available I Assessed the need for Mobilization I made an Assessment of medications to be adjusted accordingly Safety Risk assessment completed  CASE DISCUSSED IN MULTIDISCIPLINARY ROUNDS WITH ICU TEAM     Critical care provider statement:   Total critical care time: 33 minutes   Performed by: Karna Christmas MD   Critical care time was exclusive of separately billable procedures and treating other patients.   Critical care was necessary to treat or prevent imminent or life-threatening deterioration.   Critical care was time spent personally by me on the following activities: development of treatment plan with patient and/or surrogate as well as nursing, discussions with consultants, evaluation of patient's response to treatment, examination of patient, obtaining history from patient or surrogate, ordering and performing treatments and interventions, ordering and review of laboratory studies, ordering and review of radiographic studies, pulse oximetry and re-evaluation of patient's condition.    Vida Rigger, M.D.  Pulmonary & Critical Care Medicine

## 2023-04-18 NOTE — Plan of Care (Signed)
  Problem: Clinical Measurements: Goal: Respiratory complications will improve Outcome: Progressing   Problem: Nutrition: Goal: Adequate nutrition will be maintained Outcome: Progressing   Problem: Coping: Goal: Level of anxiety will decrease Outcome: Progressing   Problem: Elimination: Goal: Will not experience complications related to bowel motility Outcome: Progressing Goal: Will not experience complications related to urinary retention Outcome: Progressing   Problem: Pain Managment: Goal: General experience of comfort will improve and/or be controlled Outcome: Progressing   Problem: Safety: Goal: Ability to remain free from injury will improve Outcome: Progressing   Problem: Skin Integrity: Goal: Risk for impaired skin integrity will decrease Outcome: Progressing   Problem: Fluid Volume: Goal: Ability to maintain a balanced intake and output will improve Outcome: Progressing   Problem: Metabolic: Goal: Ability to maintain appropriate glucose levels will improve Outcome: Progressing   Problem: Nutritional: Goal: Maintenance of adequate nutrition will improve Outcome: Progressing   Problem: Tissue Perfusion: Goal: Adequacy of tissue perfusion will improve Outcome: Progressing

## 2023-04-19 ENCOUNTER — Encounter: Payer: Self-pay | Admitting: Vascular Surgery

## 2023-04-19 ENCOUNTER — Inpatient Hospital Stay: Payer: Medicare Other

## 2023-04-19 LAB — TYPE AND SCREEN
ABO/RH(D): A POS
Antibody Screen: NEGATIVE
Unit division: 0

## 2023-04-19 LAB — CBC WITH DIFFERENTIAL/PLATELET
Abs Immature Granulocytes: 0.07 10*3/uL (ref 0.00–0.07)
Basophils Absolute: 0 10*3/uL (ref 0.0–0.1)
Basophils Relative: 1 %
Eosinophils Absolute: 0.2 10*3/uL (ref 0.0–0.5)
Eosinophils Relative: 3 %
HCT: 26.7 % — ABNORMAL LOW (ref 39.0–52.0)
Hemoglobin: 8.7 g/dL — ABNORMAL LOW (ref 13.0–17.0)
Immature Granulocytes: 1 %
Lymphocytes Relative: 12 %
Lymphs Abs: 0.7 10*3/uL (ref 0.7–4.0)
MCH: 32.1 pg (ref 26.0–34.0)
MCHC: 32.6 g/dL (ref 30.0–36.0)
MCV: 98.5 fL (ref 80.0–100.0)
Monocytes Absolute: 0.6 10*3/uL (ref 0.1–1.0)
Monocytes Relative: 11 %
Neutro Abs: 4.2 10*3/uL (ref 1.7–7.7)
Neutrophils Relative %: 72 %
Platelets: 214 10*3/uL (ref 150–400)
RBC: 2.71 MIL/uL — ABNORMAL LOW (ref 4.22–5.81)
RDW: 14.9 % (ref 11.5–15.5)
WBC: 5.8 10*3/uL (ref 4.0–10.5)
nRBC: 0 % (ref 0.0–0.2)

## 2023-04-19 LAB — PHOSPHORUS: Phosphorus: 3.3 mg/dL (ref 2.5–4.6)

## 2023-04-19 LAB — GLUCOSE, CAPILLARY
Glucose-Capillary: 91 mg/dL (ref 70–99)
Glucose-Capillary: 87 mg/dL (ref 70–99)
Glucose-Capillary: 122 mg/dL — ABNORMAL HIGH (ref 70–99)
Glucose-Capillary: 126 mg/dL — ABNORMAL HIGH (ref 70–99)
Glucose-Capillary: 105 mg/dL — ABNORMAL HIGH (ref 70–99)
Glucose-Capillary: 73 mg/dL (ref 70–99)

## 2023-04-19 LAB — BASIC METABOLIC PANEL
Anion gap: 5 (ref 5–15)
BUN: 37 mg/dL — ABNORMAL HIGH (ref 8–23)
CO2: 21 mmol/L — ABNORMAL LOW (ref 22–32)
Calcium: 8.3 mg/dL — ABNORMAL LOW (ref 8.9–10.3)
Chloride: 112 mmol/L — ABNORMAL HIGH (ref 98–111)
Creatinine, Ser: 1.14 mg/dL (ref 0.61–1.24)
GFR, Estimated: >60 mL/min (ref >60)
Glucose, Bld: 103 mg/dL — ABNORMAL HIGH (ref 70–99)
Potassium: 4.2 mmol/L (ref 3.5–5.1)
Sodium: 138 mmol/L (ref 135–145)

## 2023-04-19 LAB — HEPARIN LEVEL (UNFRACTIONATED)
Heparin Unfractionated: 0.32 [IU]/mL (ref 0.30–0.70)
Heparin Unfractionated: 0.37 [IU]/mL (ref 0.30–0.70)

## 2023-04-19 LAB — BPAM RBC
Blood Product Expiration Date: 202502282359
ISSUE DATE / TIME: 202502241706
Unit Type and Rh: 600

## 2023-04-19 LAB — CULTURE, RESPIRATORY W GRAM STAIN

## 2023-04-19 LAB — TRIGLYCERIDES: Triglycerides: 138 mg/dL (ref <150)

## 2023-04-19 LAB — MAGNESIUM: Magnesium: 1.9 mg/dL (ref 1.7–2.4)

## 2023-04-19 MED ORDER — CIPROFLOXACIN IN D5W 400 MG/200ML IV SOLN
400.0000 mg | Freq: Three times a day (TID) | INTRAVENOUS | Status: DC
  Administered 2023-04-19 – 2023-04-21 (×7): 400 mg via INTRAVENOUS
  Filled 2023-04-19 (×8): qty 200

## 2023-04-19 MED ORDER — MAGNESIUM SULFATE 2 GM/50ML IV SOLN
2.0000 g | Freq: Once | INTRAVENOUS | Status: AC
  Administered 2023-04-19: 2 g via INTRAVENOUS
  Filled 2023-04-19: qty 50

## 2023-04-19 NOTE — Plan of Care (Signed)
 Problem: Education: Goal: Knowledge of General Education information will improve Description: Including pain rating scale, medication(s)/side effects and non-pharmacologic comfort measures Outcome: Not Progressing   Problem: Health Behavior/Discharge Planning: Goal: Ability to manage health-related needs will improve Outcome: Not Progressing   Problem: Clinical Measurements: Goal: Ability to maintain clinical measurements within normal limits will improve Outcome: Not Progressing Goal: Will remain free from infection Outcome: Not Progressing Goal: Diagnostic test results will improve Outcome: Not Progressing Goal: Respiratory complications will improve Outcome: Not Progressing Goal: Cardiovascular complication will be avoided Outcome: Not Progressing   Problem: Activity: Goal: Risk for activity intolerance will decrease Outcome: Not Progressing   Problem: Nutrition: Goal: Adequate nutrition will be maintained Outcome: Not Progressing   Problem: Coping: Goal: Level of anxiety will decrease Outcome: Not Progressing   Problem: Elimination: Goal: Will not experience complications related to bowel motility Outcome: Not Progressing Goal: Will not experience complications related to urinary retention Outcome: Not Progressing   Problem: Pain Managment: Goal: General experience of comfort will improve and/or be controlled Outcome: Not Progressing   Problem: Safety: Goal: Ability to remain free from injury will improve Outcome: Not Progressing   Problem: Skin Integrity: Goal: Risk for impaired skin integrity will decrease Outcome: Not Progressing   Problem: Education: Goal: Understanding of CV disease, CV risk reduction, and recovery process will improve Outcome: Not Progressing   Problem: Activity: Goal: Ability to return to baseline activity level will improve Outcome: Not Progressing   Problem: Cardiovascular: Goal: Ability to achieve and maintain adequate  cardiovascular perfusion will improve Outcome: Not Progressing Goal: Vascular access site(s) Level 0-1 will be maintained Outcome: Not Progressing   Problem: Health Behavior/Discharge Planning: Goal: Ability to safely manage health-related needs after discharge will improve Outcome: Not Progressing   Problem: Education: Goal: Understanding of CV disease, CV risk reduction, and recovery process will improve Outcome: Not Progressing   Problem: Activity: Goal: Ability to return to baseline activity level will improve Outcome: Not Progressing   Problem: Cardiovascular: Goal: Ability to achieve and maintain adequate cardiovascular perfusion will improve Outcome: Not Progressing Goal: Vascular access site(s) Level 0-1 will be maintained Outcome: Not Progressing   Problem: Health Behavior/Discharge Planning: Goal: Ability to safely manage health-related needs after discharge will improve Outcome: Not Progressing   Problem: Education: Goal: Ability to describe self-care measures that may prevent or decrease complications (Diabetes Survival Skills Education) will improve Outcome: Not Progressing   Problem: Coping: Goal: Ability to adjust to condition or change in health will improve Outcome: Not Progressing   Problem: Fluid Volume: Goal: Ability to maintain a balanced intake and output will improve Outcome: Not Progressing   Problem: Health Behavior/Discharge Planning: Goal: Ability to identify and utilize available resources and services will improve Outcome: Not Progressing Goal: Ability to manage health-related needs will improve Outcome: Not Progressing   Problem: Metabolic: Goal: Ability to maintain appropriate glucose levels will improve Outcome: Not Progressing   Problem: Nutritional: Goal: Maintenance of adequate nutrition will improve Outcome: Not Progressing Goal: Progress toward achieving an optimal weight will improve Outcome: Not Progressing   Problem: Skin  Integrity: Goal: Risk for impaired skin integrity will decrease Outcome: Not Progressing   Problem: Tissue Perfusion: Goal: Adequacy of tissue perfusion will improve Outcome: Not Progressing   Problem: Education: Goal: Knowledge of disease or condition will improve Outcome: Not Progressing Goal: Knowledge of secondary prevention will improve (MUST DOCUMENT ALL) Outcome: Not Progressing Goal: Knowledge of patient specific risk factors will improve (DELETE if not current risk factor)  Outcome: Not Progressing   Problem: Ischemic Stroke/TIA Tissue Perfusion: Goal: Complications of ischemic stroke/TIA will be minimized Outcome: Not Progressing   Problem: Coping: Goal: Will verbalize positive feelings about self Outcome: Not Progressing Goal: Will identify appropriate support needs Outcome: Not Progressing   Problem: Health Behavior/Discharge Planning: Goal: Ability to manage health-related needs will improve Outcome: Not Progressing Goal: Goals will be collaboratively established with patient/family Outcome: Not Progressing

## 2023-04-19 NOTE — Progress Notes (Signed)
 ANTICOAGULATION CONSULT NOTE  Pharmacy Consult for heparin infusion Indication: pulmonary embolus  No Known Allergies  Patient Measurements: Height: 6' 0.01" (182.9 cm) Weight: 103.2 kg (227 lb 8.2 oz) IBW/kg (Calculated) : 77.62 Heparin Dosing Weight: 99.4 kg  Vital Signs: Temp: 98.9 F (37.2 C) (02/24 2325) Temp Source: Oral (02/24 2325) BP: 117/55 (02/24 2300) Pulse Rate: 82 (02/24 2300)  Labs: Recent Labs    04/16/23 0439 04/16/23 0826 04/17/23 0157 04/18/23 0442 04/18/23 1402 04/18/23 1540 04/19/23 0010  HGB 8.6*  --  8.2* 7.8*  --  7.5*  --   HCT 25.9*  --  25.9* 24.0*  --  23.7*  --   PLT 206  --  177 191  --   --   --   HEPARINUNFRC  --    < > 0.44 0.29* 0.40  --  0.37  CREATININE 1.37*  --  1.39* 1.23  --   --   --    < > = values in this interval not displayed.    Estimated Creatinine Clearance: 63.5 mL/min (by C-G formula based on SCr of 1.23 mg/dL).   Medical History: Past Medical History:  Diagnosis Date   Alternating RBBB & LBBB    Ankle swelling 2024   Arthritis    neck   Bladder tumor    CAD (coronary artery disease)    a. 08/2017 STEMI/Cath: LM 80ost/d, LAD 100p, LCX 95 ost/p, RCA 60p, 5m, RPDA 80ost; b. 08/2017 CABG x 3: LIMA->LAD, VG->OM, VG->PDA.   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 08/2017 TEE: EF 30-35%, sept/ant/inf HK, apical AK. 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 12/2017 Echo: EF 45-50%, diff HK. Gr1 DD. Mildly reduced RV fxn. Mild RAE; c. 09/2022 Echo: EF 55-60%, mod LVH, GrI DD, nl RV fxn, mildly dil RA, mild MR.   Hyperlipidemia LDL goal <70    Ischemic cardiomyopathy    a. 08/2017 TEE: EF 30-35%; b. 12/2017 Echo: EF 45-50%; c. 09/2022 Echo: EF 55-60%.   Myocardial infarction (HCC) 08/27/2017   Shingles    2018 - right side   Vertigo    several months ago    Assessment: Pt is a 78 yo male presenting to admitted  2/11 following cardiac arrested now found with "moderate right-sided arterial clot burden... with occlusive clot in the right upper lobe anterior and posterior segmental arteries, and nonoccluding thrombus in the right lower lobe main artery and superior and posterior basal segmental arteries."  Date/Time HL Rate  Comment 2/21 1412 0.16 900 units/hr Subtherapeutic 2/22 0106 0.32 1200 un/hr Therapeutic x 1 2/22 0826 0.26 1200 units/hr Subtherapeutic 2/22 1733 0.41 1400 units/hr Therapeutic x 1 2/23 0157 0.44 1400 un/hr Therapeutic x 2 2/24 0442 0.29 1400 un/hr Subtherapeutic  2/25 0010        0.34     1600 un/hr      Thera X 1 after restart  Goal of Therapy:  Heparin level 0.3-0.7 units/ml Monitor platelets by anticoagulation protocol: Yes   Plan:  2/25:  HL @ 0010 = 0.34, therapeutic X 1 following restart - Will continue pt on current rate and recheck HL in 8 hrs on 2/25 @ 0800  Jesilyn Easom D 04/19/2023 12:39 AM

## 2023-04-19 NOTE — TOC Initial Note (Signed)
 Transition of Care [facility]) - Initial/Assessment Note    Patient Details  Name: Cory Hess MRN: [id number] Date of Birth: 1945-07-01  Transition of Care [facility]) CM/SW Contact:    Margarito Liner, LCSW Phone Number: 04/19/2023, 3:52 PM  Clinical Narrative:  MD discussed LTACH with sisters today. No family at bedside. CSW called sister, [place] Sink, introduced role, and explained that discharge planning would be discussed. Discussed LTACH options in [place] and [place]. Sister will consider [facility] and [facility] in [place]. Both facilities can offer him a bed. [facility] will have a bed tomorrow. [facility] should have a bed Thursday or Friday. TOC will follow up tomorrow regarding preference.                Expected Discharge Plan: Long Term Acute Care (LTAC) Barriers to Discharge: Continued Medical Work up   Patient Goals and CMS Choice            Expected Discharge Plan and Services     Post Acute Care Choice: Long Term Acute Care (LTAC) Living arrangements for the past 2 months: Single Family Home                                      Prior Living Arrangements/Services Living arrangements for the past 2 months: Single Family Home   Patient language and need for interpreter reviewed:: Yes        Need for Family Participation in Patient Care: Yes (Comment)     Criminal Activity/Legal Involvement Pertinent to Current Situation/Hospitalization: No - Comment as needed  Activities of Daily Living      Permission Sought/Granted Permission sought to share information with : Facility Medical sales representative, Family Supports    Share Information with NAME: Barbee Cough  Permission granted to share info w AGENCY: LTACH's  Permission granted to share info w Relationship: Sister  Permission granted to share info w Contact Information: [phone]  Emotional Assessment   Attitude/Demeanor/Rapport: Unable to Assess Affect (typically observed): Unable to Assess   Alcohol /  Substance Use: Not Applicable Psych Involvement: No (comment)  Admission diagnosis:  Cardiac arrest [facility]) [I46.9] Patient Active Problem List   Diagnosis Date Noted   Pulmonary embolus (HCC) 04/15/2023   Acute respiratory failure with hypoxia and hypercapnia (HCC) 04/10/2023   Acute CVA (cerebrovascular accident) (HCC) 04/09/2023   Hyperkalemia 04/06/2023   Acute kidney injury (HCC) 04/06/2023   Cardiac arrest (HCC) 04/05/2023   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   Acute on chronic HFrEF (heart failure with reduced ejection fraction) (HCC) 04/05/2023   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   Hematuria 11/06/2018   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   Coronary artery disease 08/28/2017   NSTEMI (non-ST elevated myocardial infarction) [facility])    PCP:  Patient, No Pcp Per Pharmacy:   [facility] [address], [place] - [address] [place] [address] Phone: [phone] Fax: [phone]     Social Drivers of Health (SDOH) Social History: SDOH Screenings   Food Insecurity: Patient Unable To Answer (04/06/2023)  Housing: Patient Unable To Answer (04/06/2023)  Transportation Needs: Patient Unable To Answer (04/06/2023)  Utilities: Patient Unable To Answer (04/06/2023)  Depression (PHQ2-9): Low Risk  (09/10/2022)  Financial Resource Strain: Low Risk  (11/06/2018)  Physical Activity: Inactive (11/06/2018)  Social Connections: Patient Unable To Answer (04/06/2023)  Stress: No Stress Concern Present (11/06/2018)  Tobacco Use: High  Risk (03/29/2023)   SDOH Interventions:     Readmission Risk Interventions     No data to display

## 2023-04-19 NOTE — Progress Notes (Signed)
 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  He presented to [facility] ER via EMS from [facility] following a witnessed cardiac arrest.  Bystander CPR initiated and EMS arrived on the scene within 30 seconds cardiac rhythm asystole.  ROSC initially achieved, however pt in and out of PEA/asystole.    Pt recently evaluated in the ER on 02/4 with urinary urgency and frequency that started 5 days prior to that presentation.  Diagnosed with a UTI and prescribed Bactrim DS twice daily for 10 days.  However, symptoms persisted prompting [facility] visit today.  ED Course  Upon arrival to the ER pt on NRB with pulse.  It was reported during chest compressions the pt began to fight and pulling at medical devices.  While in the the ER waiting room the pt cardiac arrested again requiring ACLS protocol and mechanical intubation.  Pt with varied cardiac rhythm during cardiac arrest PEA/asystole/complete heart block.  He required transcutaneously pacing.  EKG showed apparent sinus rhythm with complete heart block and ventricular escape rhythm with LBBB morphology.  Total downtime estimated 2hrs from out of hospital cardiac arrest and inpatient cardiac arrest.  Pt required epinephrine gtt due to bradycardia and hypotension.  Pt taken emergently to the cardiac cath lab for temporary transvenous pacemaker placement, emergent cardiac catheterization, and swan-ganz catheter placement.   Cardiac cath revealed severe native CAD with patent LIMA-LAD and SVG-OM. SVG-RCA is occluded, but did not appear  acute and unlikely the inciting factor for today's cardiac arrest.  Per cardiology if pt makes a meaningful recovery he may require revascularization of the RCA.  Pt admitted to the ICU post cardiac cath PCCM consulted to asume care.    Hospital course: See significant events for more detail Overnight on 2/20-2/21 patient acutely decompensated with sudden bradycardia- pause and hypoxia into the 60's. Patient emergently intubated and place on mechanical ventilatory support with resolution of baseline rhythm. CT angio post intubation revealed moderate Right Upper & lower PE without heart strain.  Most recent Significant labs: (Labs/ Imaging personally reviewed)  Chemistry: Na+:149, K+: 3.3, BUN/Cr.: 49/ 1.48, Serum CO2/ AG: 21/11 Hematology: WBC: 8.3, Hgb: 9.8,  Troponin: 74, BNP: 498.8, Lactic: 2.8  ABG: 7.38/ 40/175/23.7 CXR 04/15/23: Patchy LLL retrocardiac opacity, suspicious for pneumonia CT head wo contrast 04/15/23: Small right frontal lobe cortical infarcts this month on MRI are occult by CT. No new intracranial abnormality identified. Ct angio chest 04/15/23:  Moderate right-sided arterial clot burden as described above, with occlusive clot in the right upper lobe anterior and posterior segmental arteries, and nonoccluding thrombus in the right lower lobe main artery and superior and posterior basal segmental arteries. 2. No increase in the RV/LV ratio, but there is mild contrast reflux into the IVC, and the right main pulmonary artery is more prominent than previously consistent with arterial congestion. Cardiomegaly with old sternotomy and CABG changes. 4. Small pleural effusions with interstitial and ground-glass edema in the upper lobes. 5. More confluent and dense consolidation in the lower lobes posteriorly, which could be due to consolidated alveolar edema, pneumonia or aspiration.  6. Aortic and coronary artery atherosclerosis. 7. Hepatic steatosis. 8. Cholelithiasis. 9. Nonobstructing  left renal stone. 10. Slightly displaced recent fractures of the left anterior second through sixth and right anterior fourth through seventh ribs, almost certainly CPR related. No pneumothorax. No mediastinal hematoma.  PCCM re-consulted due to emergent acute hypoxic respiratory failure secondary to moderate right upper & lower PE requiring emergent intubation and mechanical ventilatory support.  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  Worsening AKI and mild hyperkalemia, given shifting measures.  Low threshold for Nephrology consultation. 02/13: Weaning down vasopressors, Vasopressin off, Levo @ 7, Epi @ 6.  On minimal vent support.  Renal function and hyperkalemia slowly improving.  Advanced Heart Failure diuresing with 40 mg IV Lasix x1 dose for CVP of 15.  Perform WUA. 02/14: Weaned off vasopressors  On minimal vent support, failed WUA due to        severe hypertension and increased WOB.  On WUA only opening eyes, not        following commands.  MRI Brain revealed 2 small acute infarctions affecting the        cortical brain in theright frontal region.Theone Murdoch, temporary pacer and right        femoral venous sheath removed.  AKI slowly improving.  Heparin gtt discontinued        given low suspicion for PE.  To have PICC placed per Heart Failure team request. 02/15: Pt remains mechanically intubated.  All sedation off  brainstem reflexes intact and opens eyes to voice.  Mentation precluding extubation.  Neurology consulted 02/16: Overnight pt developed worsening acute respiratory failure with accessory muscle use, tachycardia, and hypoxia requiring bagging via BVM, therefore propofol gtt restarted.  Neurology recommending MRA Head/Neck and EEG once stable to transfer.  Performing WUA and possible SBT today  2/17: No significant events overnight, mental status still lagging. stroke work up pending per neuro recs 2/18: No significant events overnight. EEG without seizures, CTH negative, and Neck as below. Neuro following for neuro prognostication 2/19: following commands, extubated 2/20: patient confused and agitated, no SOB on 4 L [place]. Overnight patient became bradycardic with pauses, while maintaining a pulse and hypoxic in the 60's. Emergently intubated and placed back on mechanical ventilatory support with return to normal rhythm. 2/22 severe hypoxia, b/l PE 2/23 severe hypoxia b/l PE 04/18/23-not following verbal communication, on FIO2 40%.  Secretions are moderate, aspirate in process from ETT. Vascular consult today.  For thrombectomy today after review of plan with family.  04/19/23- patietn s/p thrombectomy yesterday, today I met with family at bedside and we reivewed medical plan and answered questions. Were working on SBT today for liberation trial.  He is approved with [facility] for admission  Interim History / Subjective:  Remains critically ill Remains intubated Severe hypoxia Requires VENT support for survival  Vent Mode: PRVC FiO2 (%):  [40 %-60 %] 40 % Set Rate:  [15 bmp] 15 bmp Vt Set:  [500 mL] 500 mL PEEP:  [5  cmH20] 5 cmH20 Plateau Pressure:  [11 cmH20-24 cmH20] 24 cmH20  Objective   Blood pressure (!) 146/75, pulse 94, temperature 99.5 F (37.5 C), temperature source Axillary, resp. rate 19, height 6' 0.01" (1.829 m), weight 103.5 kg, SpO2 100%.      Intake/Output Summary (Last 24  hours) at 04/19/2023 1111 Last data filed at 04/19/2023 1610 Gross per 24 hour  Intake 3104.73 ml  Output 2935 ml  Net 169.73 ml   Filed Weights   04/17/23 0411 04/18/23 0400 04/19/23 0400  Weight: 103.3 kg 103.2 kg 103.5 kg     REVIEW OF SYSTEMS  PATIENT IS UNABLE TO PROVIDE COMPLETE REVIEW OF SYSTEMS DUE TO SEVERE CRITICAL ILLNESS   PHYSICAL EXAMINATION:  GENERAL:critically ill appearing, +resp distress EYES: Pupils equal, round, reactive to light.  No scleral icterus.  MOUTH: Moist mucosal membrane. INTUBATED NECK: Supple.  PULMONARY: Lungs clear to auscultation, +rhonchi, +wheezing CARDIOVASCULAR: S1 and S2.  Regular rate and rhythm GASTROINTESTINAL: Soft, nontender, -distended. Positive bowel sounds.  MUSCULOSKELETAL: No swelling, clubbing, or edema.  NEUROLOGIC: sedated SKIN:normal, warm to touch, Capillary refill delayed  Pulses present bilaterally   Assessment & Plan:  78 yo white male admitted for acute Cardiac arrest CAD with Complete heart block s/p temporary transvenous pacemaker placement (removed 02/15) Junctional tachycardia  With  Acute on chronic HFrEF with Cardiogenic shock leading to acute severe hypoxic resp failure, s/p extubation, then with acute PE leading to severe hypoxia and needing re-intubation  Severe ACUTE Hypoxic and Hypercapnic Respiratory Failure -continue Mechanical Ventilator support -Wean Fio2 and PEEP as tolerated -VAP/VENT bundle implementation - Wean PEEP & FiO2 as tolerated, maintain SpO2 > 88% - Head of bed elevated 30 degrees, VAP protocol in place - Plateau pressures less than 30 cm H20  - Intermittent chest x-ray & ABG PRN - Ensure adequate pulmonary hygiene  Unable to wean off vent today severe hypoxia       Moderate Right upper & Lower Pulmonary embolism -NON massive  - Systemic Heparin drip per pharmacy protocol - Follow up BLE dopplers to assess for DVT - Echocardiogram ordered  - consider Vascular consult if IVC  filter/ thrombectomy is warranted    S/p thrombectomy - extremely large clot burden removed - appreciate vascular surgery.       NEUROLOGY ACUTE METABOLIC ENCEPHALOPATHY -need for sedation -Goal RASS -2 to -3 MR Brain 02/14: 2 small acute infarctions affecting the cortical brain in the right frontal region.  Mild chronic small-vessel ischemic change of the cerebral hemispheric white matter with a few old small cortical infarctions scattered about the right hemisphere suggesting chronic/recurrent micro embolic infarctions. Recommend carotid evaluation. US carotid showing < 50% stenosis. MRA Head/Neck with small infarcts, and EEG without seizures. Repeat CTH no new acute intracranial abnormality - Neurology consulted appreciate input  - Stroke work up per Neuro recs - Continue ASA 81mg  daily + plavix 75mg  daily x21 days f/b ASA 81mg  daily monotherapy after that  - Continue atorvastatin 80mg  daily        ACUTE SYSTOLIC CARDIAC FAILURE-  Cardiac arrest  Complete heart block s/p temporary transvenous pacemaker placement (removed 02/15) Junctional tachycardia,CAD HTN Acute on chronic HFrEF  Cardiac Cath 02/11: severe native CAD with patent LIMA-LAD and SVG-OM. SVG-RCA is occluded, but did not appear acute and unlikely the inciting factor for today's cardiac arrest Echocardiogram 04/07/23:  LVEF 40-45%, mild LVH, Grade I DD, RV systolic function not well visualized, RV size normal - diuresis with lasix as hemodynamics and  renal function allow - cardiology following, appreciate input  ACUTE KIDNEY INJURY/Renal Failure -continue Foley Catheter-assess need -Avoid nephrotoxic agents -Follow urine output, BMP -Ensure adequate renal perfusion, optimize oxygenation -Renal dose medications   Intake/Output Summary (Last 24 hours) at 04/19/2023 1111 Last data filed at 04/19/2023 4782 Gross per 24 hour  Intake 3104.73 ml  Output 2935 ml  Net 169.73 ml      Latest Ref Rng & Units 04/19/2023     4:06 AM 04/18/2023    4:42 AM 04/17/2023    1:57 AM  BMP  Glucose 70 - 99 mg/dL 956  92  97   BUN 8 - 23 mg/dL 37  43  43   Creatinine 0.61 - 1.24 mg/dL 2.13  0.86  5.78   Sodium 135 - 145 mmol/L 138  142  143   Potassium 3.5 - 5.1 mmol/L 4.2  3.9  3.5   Chloride 98 - 111 mmol/L 112  112  113   CO2 22 - 32 mmol/L 21  21  21    Calcium 8.9 - 10.3 mg/dL 8.3  8.3  8.3       ENDO - ICU hypoglycemic\Hyperglycemia protocol -check FSBS per protocol   GI GI PROPHYLAXIS as indicated NUTRITIONAL STATUS DIET-->TF's as tolerated Constipation protocol as indicated   ELECTROLYTES -follow labs as needed -replace as needed -pharmacy consultation and following  RESTRICTIVE TRANSFUSION PROTOCOL TRANSFUSION  IF HGB<7  or ACTIVE BLEEDING OR DX of ACUTE CORONARY SYNDROMES    SEVERE HYPOXIA DIFFICULT TO WEAN    Best Practice (right click and "Reselect all SmartList Selections" daily)  Diet/type: NPO DVT prophylaxis LMWH Pressure ulcer(s): N/A GI prophylaxis: H2B Lines: Portacath present on admission and Left Upper Extremity PICC and still needed Foley:  Yes, and it is still needed Code Status:  DNR with pre-interventions Last date of multidisciplinary goals of care discussion [04/14/23]  2/22: family updated about change in clinical status and emergent intubation requiring mechanical ventilator support. Labs   CBC: Recent Labs  Lab 04/15/23 0337 04/16/23 0439 04/17/23 0157 04/18/23 0442 04/18/23 1540 04/19/23 0406  WBC 8.3 13.2* 8.9 6.8  --  5.8  NEUTROABS  --   --   --   --   --  4.2  HGB 9.8* 8.6* 8.2* 7.8* 7.5* 8.7*  HCT 29.7* 25.9* 25.9* 24.0* 23.7* 26.7*  MCV 100.3* 100.0 103.6* 101.3*  --  98.5  PLT 226 206 177 191  --  214    Basic Metabolic Panel: Recent Labs  Lab 04/13/23 0333 04/14/23 0432 04/15/23 0337 04/15/23 0350 04/16/23 0439 04/17/23 0157 04/18/23 0442 04/19/23 0406  NA 146* 150*  --  149* 144 143 142 138  K 3.9 3.3*  --  3.3* 3.6 3.5 3.9  4.2  CL 114* 115*  --  117* 114* 113* 112* 112*  CO2 23 24  --  21* 22 21* 21* 21*  GLUCOSE 131* 100*  --  174* 99 97 92 103*  BUN 60* 52*  --  49* 41* 43* 43* 37*  CREATININE 1.42* 1.37*  --  1.48* 1.37* 1.39* 1.23 1.14  CALCIUM 8.7* 8.6*  --  8.7* 8.3* 8.3* 8.3* 8.3*  MG 2.2 2.2 2.4  --  2.2  --   --  1.9  PHOS 3.5 3.1  --  5.6* 4.0 3.6  --  3.3   GFR: Estimated Creatinine Clearance: 68.6 mL/min (by C-G formula based on SCr of 1.14 mg/dL). Recent Labs  Lab 04/15/23 [phone] 04/15/23  4098 04/15/23 1191 04/16/23 0439 04/17/23 0157 04/18/23 0442 04/19/23 0406  PROCALCITON  --   --  0.22  --   --   --   --   WBC 8.3  --   --  13.2* 8.9 6.8 5.8  LATICACIDVEN 2.8* 1.1  --   --   --   --   --     Liver Function Tests: Recent Labs  Lab 04/13/23 0333 04/14/23 0432 04/15/23 0350 04/16/23 0439 04/17/23 0157  ALBUMIN 2.6* 2.8* 2.6* 2.5* 2.4*   No results for input(s): "LIPASE", "AMYLASE" in the last 168 hours. No results for input(s): "AMMONIA" in the last 168 hours.  ABG    Component Value Date/Time   PHART 7.38 04/15/2023 0515   PCO2ART 40 04/15/2023 0515   PO2ART 175 (H) 04/15/2023 0515   HCO3 23.7 04/15/2023 0515   TCO2 33 (H) 04/05/2023 1459   ACIDBASEDEF 1.3 04/15/2023 0515   O2SAT 100 04/15/2023 0515     Coagulation Profile: No results for input(s): "INR", "PROTIME" in the last 168 hours.   Cardiac Enzymes: No results for input(s): "CKTOTAL", "CKMB", "CKMBINDEX", "TROPONINI" in the last 168 hours.  HbA1C: Hgb A1c MFr Bld  Date/Time Value Ref Range Status  04/05/2023 07:38 PM 5.9 (H) 4.8 - 5.6 % Final    Comment:    (NOTE) Pre diabetes:          5.7%-6.4%  Diabetes:              >6.4%  Glycemic control for   <7.0% adults with diabetes   08/28/2017 02:27 AM 5.6 4.8 - 5.6 % Final    Comment:    (NOTE) Pre diabetes:          5.7%-6.4% Diabetes:              >6.4% Glycemic control for   <7.0% adults with diabetes     CBG: Recent Labs  Lab  04/18/23 1623 04/18/23 2004 04/18/23 2340 04/19/23 0408 04/19/23 0730  GLUCAP 88 81 79 91 87    Review of Systems:   Unable to assess pt mechanically intubated   Past Medical History:  He,  has a past medical history of Alternating RBBB & LBBB, Ankle swelling (2024), Arthritis, Bladder tumor, CAD (coronary artery disease), Cancer (HCC), Cancer of base of tongue (HCC), Essential hypertension, 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   Surgical History:   Past Surgical History:  Procedure Laterality Date   CARDIAC CATHETERIZATION     CATARACT EXTRACTION W/PHACO Left 07/18/2018   Procedure: CATARACT EXTRACTION PHACO AND INTRAOCULAR LENS PLACEMENT (IOC)  LEFT;  Surgeon: Nevada Crane, MD;  Location: [facility];  Service: Ophthalmology;  Laterality: Left;   CATARACT EXTRACTION W/PHACO Right 08/14/2018   Procedure: CATARACT EXTRACTION PHACO AND INTRAOCULAR LENS PLACEMENT (IOC)  RIGHT;  Surgeon: Nevada Crane, MD;  Location: [facility];  Service: Ophthalmology;  Laterality: Right;   CORONARY ARTERY BYPASS GRAFT N/A 08/27/2017   Procedure: CORONARY ARTERY BYPASS GRAFTING (CABG) ON PUMP USING LEFT INTERNAL MAMMARY ARTERY AND LEFT GREATER SAPHENOUS VEIN VIA ENDOVEIN HARVEST;  Surgeon: Alleen Borne, MD;  Location: [facility] OR;  Service: Open Heart Surgery;  Laterality: N/A;   CORONARY/GRAFT ACUTE MI REVASCULARIZATION N/A 08/27/2017   Procedure: Coronary/Graft Acute MI Revascularization;  Surgeon: Iran Ouch, MD;  Location: [facility];  Service: Cardiovascular;  Laterality: N/A;  CYSTOSCOPY W/ RETROGRADES Bilateral 12/25/2018   Procedure: CYSTOSCOPY WITH RETROGRADE PYELOGRAM;  Surgeon: Vanna Scotland, MD;  Location: [facility] ORS;  Service: Urology;  Laterality: Bilateral;   FRACTURE SURGERY Right 1958   arm and left wrist  compound fracture, no metal   HERNIA REPAIR Right    inguinial   IR IMAGING GUIDED PORT INSERTION  10/15/2022   LEFT HEART CATH AND CORONARY ANGIOGRAPHY N/A 08/27/2017   Procedure: LEFT HEART CATH AND CORONARY ANGIOGRAPHY;  Surgeon: Iran Ouch, MD;  Location: [facility];  Service: Cardiovascular;  Laterality: N/A;   PULMONARY THROMBECTOMY Bilateral 04/18/2023   Procedure: PULMONARY THROMBECTOMY;  Surgeon: Annice Needy, MD;  Location: [facility];  Service: Cardiovascular;  Laterality: Bilateral;   RIGHT/LEFT HEART CATH AND CORONARY/GRAFT ANGIOGRAPHY N/A 04/05/2023   Procedure: RIGHT/LEFT HEART CATH AND CORONARY/GRAFT ANGIOGRAPHY;  Surgeon: Yvonne Kendall, MD;  Location: [facility];  Service: Cardiovascular;  Laterality: N/A;   TEMPORARY PACEMAKER N/A 04/05/2023   Procedure: TEMPORARY PACEMAKER;  Surgeon: Yvonne Kendall, MD;  Location: [facility];  Service: Cardiovascular;  Laterality: N/A;   TRANSURETHRAL RESECTION OF BLADDER TUMOR WITH MITOMYCIN-C N/A 12/25/2018   Procedure: TRANSURETHRAL RESECTION OF BLADDER TUMOR WITH Gemcitabine;  Surgeon: Vanna Scotland, MD;  Location: [facility] OR[facility];  Service: Urology;  Laterality: N/A;       DVT/GI PRX  assessed I Assessed the need for Labs I Assessed the need for Foley I Assessed the need for Central Venous Line Family Discussion when available I Assessed the need for Mobilization I made an Assessment of medications to be adjusted accordingly Safety Risk assessment completed  CASE DISCUSSED IN MULTIDISCIPLINARY ROUNDS WITH ICU TEAM     Critical care provider statement:   Total critical care time: 33 minutes   Performed by: Karna Christmas MD   Critical care time was exclusive of separately billable procedures and treating other patients.   Critical care was necessary to treat or prevent imminent or life-threatening deterioration.   Critical care was time spent personally by me on the following  activities: development of treatment plan with patient and/or surrogate as well as nursing, discussions with consultants, evaluation of patient's response to treatment, examination of patient, obtaining history from patient or surrogate, ordering and performing treatments and interventions, ordering and review of laboratory studies, ordering and review of radiographic studies, pulse oximetry and re-evaluation of patient's condition.    Vida Rigger, M.D.  Pulmonary & Critical Care Medicine

## 2023-04-19 NOTE — Plan of Care (Signed)
  Problem: Clinical Measurements: Goal: Ability to maintain clinical measurements within normal limits will improve Outcome: Progressing Goal: Respiratory complications will improve Outcome: Progressing Goal: Cardiovascular complication will be avoided Outcome: Progressing   Problem: Activity: Goal: Risk for activity intolerance will decrease Outcome: Progressing   Problem: Nutrition: Goal: Adequate nutrition will be maintained Outcome: Progressing   Problem: Elimination: Goal: Will not experience complications related to bowel motility Outcome: Progressing Goal: Will not experience complications related to urinary retention Outcome: Progressing

## 2023-04-19 NOTE — Progress Notes (Signed)
 ANTICOAGULATION CONSULT NOTE  Pharmacy Consult for heparin infusion Indication: pulmonary embolus  No Known Allergies  Patient Measurements: Height: 6' 0.01" (182.9 cm) Weight: 103.5 kg (228 lb 2.8 oz) IBW/kg (Calculated) : 77.62 Heparin Dosing Weight: 99.4 kg  Vital Signs: Temp: 99.5 F (37.5 C) (02/25 0800) Temp Source: Axillary (02/25 0800) BP: 146/75 (02/25 0830) Pulse Rate: 94 (02/25 0830)  Labs: Recent Labs    04/17/23 0157 04/18/23 0442 04/18/23 1402 04/18/23 1540 04/19/23 0010 04/19/23 0406 04/19/23 0813  HGB 8.2* 7.8*  --  7.5*  --  8.7*  --   HCT 25.9* 24.0*  --  23.7*  --  26.7*  --   PLT 177 191  --   --   --  214  --   HEPARINUNFRC 0.44 0.29* 0.40  --  0.37  --  0.32  CREATININE 1.39* 1.23  --   --   --  1.14  --    Estimated Creatinine Clearance: 68.6 mL/min (by C-G formula based on SCr of 1.14 mg/dL).  Medical History: Past Medical History:  Diagnosis Date   Alternating RBBB & LBBB    Ankle swelling 2024   Arthritis    neck   Bladder tumor    CAD (coronary artery disease)    a. 08/2017 STEMI/Cath: LM 80ost/d, LAD 100p, LCX 95 ost/p, RCA 60p, 48m, RPDA 80ost; b. 08/2017 CABG x 3: LIMA->LAD, VG->OM, VG->PDA.   Cancer [facility])    bladder tumor   Cancer of base of tongue (HCC)    Essential hypertension    HFimpEF (heart failure with improved ejection fraction) (HCC)    a. 08/2017 TEE: EF 30-35%, sept/ant/inf HK, apical AK. 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 12/2017 Echo: EF 45-50%, diff HK. Gr1 DD. Mildly reduced RV fxn. Mild RAE; c. 09/2022 Echo: EF 55-60%, mod LVH, GrI DD, nl RV fxn, mildly dil RA, mild MR.   Hyperlipidemia LDL goal <70    Ischemic cardiomyopathy    a. 08/2017 TEE: EF 30-35%; b. 12/2017 Echo: EF 45-50%; c. 09/2022 Echo: EF 55-60%.   Myocardial infarction (HCC) 08/27/2017   Shingles    2018 - right side   Vertigo    several months ago   Assessment: Pt is a 78 yo male presenting to admitted 2/11 following cardiac arrested now found with  "moderate right-sided arterial clot burden... with occlusive clot in the right upper lobe anterior and posterior segmental arteries, and nonoccluding thrombus in the right lower lobe main artery and superior and posterior basal segmental arteries." Pharmacy was consulted to monitor and dose this patient's heparin.   Date/Time HL Rate  Comment 2/21 1412 0.16 900 units/hr Subtherapeutic 2/22 0106 0.32 1200 un/hr Therapeutic x 1 2/22 0826 0.26 1200 units/hr Subtherapeutic 2/22 1733 0.41 1400 units/hr Therapeutic x 1 2/23 0157 0.44 1400 un/hr Therapeutic x 2 2/24 0442 0.29 1400 un/hr Subtherapeutic Held 2/25 0010        0.37    1600 un/hr      Thera X 1 after restart 2/25@0813  0.32 1600 units/hr Therapeutic x 2   Goal of Therapy:  Heparin level 0.3-0.7 units/ml Monitor platelets by anticoagulation protocol: Yes   Plan:  Heparin is therapeutic 2x Continue heparin at 1600 units/hr Monitor heparin levels with morning labs  Continue to monitor H&H and platelets daily while on heparin.  Thank you for allowing pharmacy to participate in this patient's care.   Effie Shy, PharmD Pharmacy Resident  04/19/2023 2:19 PM

## 2023-04-19 NOTE — Progress Notes (Signed)
  Progress Note    04/19/2023 11:33 AM 1 Day Post-Op  Subjective:  Yobani Schertzer is a 78 yo male now POD #1 from pulmonary thrombectomy.  Patient remains sedated and intubated this morning.  Patient is currently on 40% FiO2 via ventilator with a PEEP of 5 and strong oxygen saturations ranging from 96 to 98%.  Vitals all remained stable.   Vitals:   04/19/23 0815 04/19/23 0830  BP:  (!) 146/75  Pulse: 95 94  Resp: (!) 21 19  Temp:    SpO2: 99% 100%   Physical Exam: Cardiac:  RRR, normal S1 and S2 Lungs: Lungs clear to auscultation with some scattered rhonchi and scattered wheezing. Incisions: Right groin access with dressing clean dry and intact.  No hematoma seroma to note. Extremities: Palpable pulses throughout.  All extremities warm to touch. Abdomen: Positive bowel sounds throughout, soft, nontender nondistended.  NG tube with tube feeds running. Neurologic: Patient sedated and ventilated.  CBC    Component Value Date/Time   WBC 5.8 04/19/2023 0406   RBC 2.71 (L) 04/19/2023 0406   HGB 8.7 (L) 04/19/2023 0406   HGB 10.3 (L) 01/14/2023 0927   HGB 14.6 04/01/2021 0933   HCT 26.7 (L) 04/19/2023 0406   HCT 43.4 04/01/2021 0933   PLT 214 04/19/2023 0406   PLT 115 (L) 01/14/2023 0927   PLT 151 04/01/2021 0933   MCV 98.5 04/19/2023 0406   MCV 92 04/01/2021 0933   MCH 32.1 04/19/2023 0406   MCHC 32.6 04/19/2023 0406   RDW 14.9 04/19/2023 0406   RDW 12.3 04/01/2021 0933   LYMPHSABS 0.7 04/19/2023 0406   MONOABS 0.6 04/19/2023 0406   EOSABS 0.2 04/19/2023 0406   BASOSABS 0.0 04/19/2023 0406    BMET    Component Value Date/Time   NA 138 04/19/2023 0406   NA 141 10/05/2022 1629   K 4.2 04/19/2023 0406   CL 112 (H) 04/19/2023 0406   CO2 21 (L) 04/19/2023 0406   GLUCOSE 103 (H) 04/19/2023 0406   BUN 37 (H) 04/19/2023 0406   BUN 11 10/05/2022 1629   CREATININE 1.14 04/19/2023 0406   CREATININE 1.56 (H) 01/14/2023 0927   CALCIUM 8.3 (L) 04/19/2023 0406   GFRNONAA  >60 04/19/2023 0406   GFRNONAA 46 (L) 01/14/2023 0927   GFRAA >60 12/21/2018 0937    INR    Component Value Date/Time   INR 1.4 (H) 04/05/2023 1703     Intake/Output Summary (Last 24 hours) at 04/19/2023 1133 Last data filed at 04/19/2023 0837 Gross per 24 hour  Intake 3104.73 ml  Output 2935 ml  Net 169.73 ml     Assessment/Plan:  78 y.o. male is s/p pulmonary thrombectomy.  1 Day Post-Op   PLAN Per vascular surgery okay to stop heparin infusion and started on oral anticoagulation once patient is medically stable by the ICU team.  Vascular surgery recommends Eliquis 5 mg twice daily. Currently the patient is on aspirin 81 mg daily and Plavix 75 mg daily due to his cardiac status postcatheterization.  Vascular surgery recommends either aspirin with Eliquis or Plavix with Eliquis but not both at this time.  Follow-up with cardiology as to which they prefer.  DVT prophylaxis: Heparin infusion, ASA 81 mg daily, Plavix 75 mg daily   Marcie [facility] 04/19/2023 11:33 AM

## 2023-04-19 NOTE — Progress Notes (Signed)
 Dr. Karna Christmas has been informed that the patient started to produce bloody secretions from ETT tube and from around mouth after NG tube has been placed. Verbal order has been given to hold heparin for 2 hrs to prevent further bleeding.

## 2023-04-19 NOTE — Progress Notes (Addendum)
 PHARMACY CONSULT NOTE  Pharmacy Consult for Electrolyte Monitoring and Replacement   Recent Labs: Potassium (mmol/L)  Date Value  04/19/2023 4.2   Magnesium (mg/dL)  Date Value  16/11/9602 1.9   Calcium (mg/dL)  Date Value  54/10/8117 8.3 (L)   Albumin (g/dL)  Date Value  14/78/2956 2.4 (L)  04/01/2021 4.2   Phosphorus (mg/dL)  Date Value  21/30/8657 3.3   Sodium (mmol/L)  Date Value  04/19/2023 138  10/05/2022 141   Assessment: DC is a 78 yo male who presented [facility] ED post cardiac arrest. Their downtime was 2 hours. Patient just re-intubated due to hypoxia and found to have "moderate right-sided arterial clot burden as described above, with occlusive clot in the right upper lobe anterior and posterior segmental arteries, and nonoccluding thrombus in the right lower  lobe main artery and superior and posterior basal segmental arteries." Pharmacy has been consult to manage this patient's electrolytes while in the ICU.   Fluids: free water 200 ml q4H  Nutrition: Prosource 60 ml daily + vital high protein 60 ml/hr.  Pertinent Medications: NA   Goal of Therapy:  Electrolytes WNL (K > 4 and Mg > 2 due to cardiac arrest) Today, 04/19/2023 K = 4.2 Mg = 1.9 Phos = 3.3 Na = 138  Plan:  Give Magnesium sulfate 2g IV x 1  No other replacement indicated today Continue to monitor with AM labs  Effie Shy, PharmD Pharmacy Resident  04/19/2023 7:41 AM

## 2023-04-20 ENCOUNTER — Ambulatory Visit: Payer: Medicare Other | Attending: Nurse Practitioner | Admitting: Nurse Practitioner

## 2023-04-20 ENCOUNTER — Ambulatory Visit: Payer: Medicare Other | Admitting: Radiation Oncology

## 2023-04-20 DIAGNOSIS — I469 Cardiac arrest, cause unspecified: Secondary | ICD-10-CM | POA: Diagnosis not present

## 2023-04-20 DIAGNOSIS — G9341 Metabolic encephalopathy: Secondary | ICD-10-CM | POA: Diagnosis not present

## 2023-04-20 DIAGNOSIS — J9602 Acute respiratory failure with hypercapnia: Secondary | ICD-10-CM | POA: Diagnosis not present

## 2023-04-20 DIAGNOSIS — J9601 Acute respiratory failure with hypoxia: Secondary | ICD-10-CM | POA: Diagnosis not present

## 2023-04-20 LAB — CBC
HCT: 26.6 % — ABNORMAL LOW (ref 39.0–52.0)
Hemoglobin: 8.7 g/dL — ABNORMAL LOW (ref 13.0–17.0)
MCH: 32.3 pg (ref 26.0–34.0)
MCHC: 32.7 g/dL (ref 30.0–36.0)
MCV: 98.9 fL (ref 80.0–100.0)
Platelets: 215 10*3/uL (ref 150–400)
RBC: 2.69 MIL/uL — ABNORMAL LOW (ref 4.22–5.81)
RDW: 14.6 % (ref 11.5–15.5)
WBC: 5.2 10*3/uL (ref 4.0–10.5)
nRBC: 0 % (ref 0.0–0.2)

## 2023-04-20 LAB — BASIC METABOLIC PANEL
Anion gap: 4 — ABNORMAL LOW (ref 5–15)
BUN: 35 mg/dL — ABNORMAL HIGH (ref 8–23)
CO2: 23 mmol/L (ref 22–32)
Calcium: 8.5 mg/dL — ABNORMAL LOW (ref 8.9–10.3)
Chloride: 112 mmol/L — ABNORMAL HIGH (ref 98–111)
Creatinine, Ser: 1.08 mg/dL (ref 0.61–1.24)
GFR, Estimated: >60 mL/min (ref >60)
Glucose, Bld: 108 mg/dL — ABNORMAL HIGH (ref 70–99)
Potassium: 4 mmol/L (ref 3.5–5.1)
Sodium: 139 mmol/L (ref 135–145)

## 2023-04-20 LAB — PHOSPHORUS: Phosphorus: 3.5 mg/dL (ref 2.5–4.6)

## 2023-04-20 LAB — GLUCOSE, CAPILLARY
Glucose-Capillary: 76 mg/dL (ref 70–99)
Glucose-Capillary: 100 mg/dL — ABNORMAL HIGH (ref 70–99)
Glucose-Capillary: 108 mg/dL — ABNORMAL HIGH (ref 70–99)
Glucose-Capillary: 94 mg/dL (ref 70–99)
Glucose-Capillary: 111 mg/dL — ABNORMAL HIGH (ref 70–99)
Glucose-Capillary: 101 mg/dL — ABNORMAL HIGH (ref 70–99)

## 2023-04-20 LAB — HEPARIN LEVEL (UNFRACTIONATED)
Heparin Unfractionated: 0.27 [IU]/mL — ABNORMAL LOW (ref 0.30–0.70)
Heparin Unfractionated: 0.48 [IU]/mL (ref 0.30–0.70)
Heparin Unfractionated: 0.43 [IU]/mL (ref 0.30–0.70)

## 2023-04-20 LAB — MAGNESIUM: Magnesium: 2 mg/dL (ref 1.7–2.4)

## 2023-04-20 MED ORDER — HEPARIN BOLUS VIA INFUSION
1500.0000 [IU] | Freq: Once | INTRAVENOUS | Status: AC
  Administered 2023-04-20: 1500 [IU] via INTRAVENOUS
  Filled 2023-04-20: qty 1500

## 2023-04-20 MED ORDER — VITAL HIGH PROTEIN PO LIQD
1000.0000 mL | ORAL | Status: DC
  Administered 2023-04-20 – 2023-04-21 (×2): 1000 mL

## 2023-04-20 NOTE — Progress Notes (Signed)
 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  He presented to [facility] ER via EMS from [facility] following a witnessed cardiac arrest.  Bystander CPR initiated and EMS arrived on the scene within 30 seconds cardiac rhythm asystole.  ROSC initially achieved, however pt in and out of PEA/asystole.    Pt recently evaluated in the ER on 02/4 with urinary urgency and frequency that started 5 days prior to that presentation.  Diagnosed with a UTI and prescribed Bactrim DS twice daily for 10 days.  However, symptoms persisted prompting [facility] visit today.  ED Course  Upon arrival to the ER pt on NRB with pulse.  It was reported during chest compressions the pt began to fight and pulling at medical devices.  While in the the ER waiting room the pt cardiac arrested again requiring ACLS protocol and mechanical intubation.  Pt with varied cardiac rhythm during cardiac arrest PEA/asystole/complete heart block.  He required transcutaneously pacing.  EKG showed apparent sinus rhythm with complete heart block and ventricular escape rhythm with LBBB morphology.  Total downtime estimated 2hrs from out of hospital cardiac arrest and inpatient cardiac arrest.  Pt required epinephrine gtt due to bradycardia and hypotension.  Pt taken emergently to the cardiac cath lab for temporary transvenous pacemaker placement, emergent cardiac catheterization, and swan-ganz catheter placement.   Cardiac cath revealed severe native CAD with patent LIMA-LAD and SVG-OM. SVG-RCA is occluded, but did not appear  acute and unlikely the inciting factor for today's cardiac arrest.  Per cardiology if pt makes a meaningful recovery he may require revascularization of the RCA.  Pt admitted to the ICU post cardiac cath PCCM consulted to asume care.    Hospital course: See significant events for more detail Overnight on 2/20-2/21 patient acutely decompensated with sudden bradycardia- pause and hypoxia into the 60's. Patient emergently intubated and place on mechanical ventilatory support with resolution of baseline rhythm. CT angio post intubation revealed moderate Right Upper & lower PE without heart strain.  Most recent Significant labs: (Labs/ Imaging personally reviewed)  Chemistry: Na+:149, K+: 3.3, BUN/Cr.: 49/ 1.48, Serum CO2/ AG: 21/11 Hematology: WBC: 8.3, Hgb: 9.8,  Troponin: 74, BNP: 498.8, Lactic: 2.8  ABG: 7.38/ 40/175/23.7 CXR 04/15/23: Patchy LLL retrocardiac opacity, suspicious for pneumonia CT head wo contrast 04/15/23: Small right frontal lobe cortical infarcts this month on MRI are occult by CT. No new intracranial abnormality identified. Ct angio chest 04/15/23:  Moderate right-sided arterial clot burden as described above, with occlusive clot in the right upper lobe anterior and posterior segmental arteries, and nonoccluding thrombus in the right lower lobe main artery and superior and posterior basal segmental arteries. 2. No increase in the RV/LV ratio, but there is mild contrast reflux into the IVC, and the right main pulmonary artery is more prominent than previously consistent with arterial congestion. Cardiomegaly with old sternotomy and CABG changes. 4. Small pleural effusions with interstitial and ground-glass edema in the upper lobes. 5. More confluent and dense consolidation in the lower lobes posteriorly, which could be due to consolidated alveolar edema, pneumonia or aspiration.  6. Aortic and coronary artery atherosclerosis. 7. Hepatic steatosis. 8. Cholelithiasis. 9. Nonobstructing  left renal stone. 10. Slightly displaced recent fractures of the left anterior second through sixth and right anterior fourth through seventh ribs, almost certainly CPR related. No pneumothorax. No mediastinal hematoma.  PCCM re-consulted due to emergent acute hypoxic respiratory failure secondary to moderate right upper & lower PE requiring emergent intubation and mechanical ventilatory support.  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  Worsening AKI and mild hyperkalemia, given shifting measures.  Low threshold for Nephrology consultation. 02/13: Weaning down vasopressors, Vasopressin off, Levo @ 7, Epi @ 6.  On minimal vent support.  Renal function and hyperkalemia slowly improving.  Advanced Heart Failure diuresing with 40 mg IV Lasix x1 dose for CVP of 15.  Perform WUA. 02/14: Weaned off vasopressors  On minimal vent support, failed WUA due to        severe hypertension and increased WOB.  On WUA only opening eyes, not        following commands.  MRI Brain revealed 2 small acute infarctions affecting the        cortical brain in theright frontal region.Theone Murdoch, temporary pacer and right        femoral venous sheath removed.  AKI slowly improving.  Heparin gtt discontinued        given low suspicion for PE.  To have PICC placed per Heart Failure team request. 02/15: Pt remains mechanically intubated.  All sedation off  brainstem reflexes intact and opens eyes to voice.  Mentation precluding extubation.  Neurology consulted 02/16: Overnight pt developed worsening acute respiratory failure with accessory muscle use, tachycardia, and hypoxia requiring bagging via BVM, therefore propofol gtt restarted.  Neurology recommending MRA Head/Neck and EEG once stable to transfer.  Performing WUA and possible SBT today  2/17: No significant events overnight, mental status still lagging. stroke work up pending per neuro recs 2/18: No significant events overnight. EEG without seizures, CTH negative, and Neck as below. Neuro following for neuro prognostication 2/19: following commands, extubated 2/20: patient confused and agitated, no SOB on 4 L . Overnight patient became bradycardic with pauses, while maintaining a pulse and hypoxic in the 60's. Emergently intubated and placed back on mechanical ventilatory support with return to normal rhythm. 2/22 severe hypoxia, b/l PE 2/23 severe hypoxia b/l PE 04/18/23-not following verbal communication, on FIO2 40%.  Secretions are moderate, aspirate in process from ETT. Vascular consult today.  For thrombectomy today after review of plan with family.  04/19/23- patietn s/p thrombectomy yesterday, today I met with family at bedside and we reivewed medical plan and answered questions. Were working on SBT today for liberation trial.  He is approved with [facility] for admission, family updated 2/26 remains on vent  Interim History / Subjective:  Remains critically ill Remains intubated +hypoxia Requires VENT support for survival Failed weaning trials, sister updated  Vent Mode: PRVC FiO2 (%):  [40 %] 40 % Set Rate:  [15 bmp] 15 bmp Vt  Set:  [500 mL] 500 mL PEEP:  [5 cmH20] 5 cmH20 Plateau Pressure:  [11 cmH20-24 cmH20] 13 cmH20  Objective   Blood pressure (!) 122/55, pulse 82, temperature 98.9 F (37.2 C), temperature source Axillary, resp. rate 17, height 6' 0.01" (1.829 m),  weight 103.7 kg, SpO2 100%.      Intake/Output Summary (Last 24 hours) at 04/20/2023 0733 Last data filed at 04/20/2023 2956 Gross per 24 hour  Intake 2659.96 ml  Output 4310 ml  Net -1650.04 ml   Filed Weights   04/18/23 0400 04/19/23 0400 04/20/23 0500  Weight: 103.2 kg 103.5 kg 103.7 kg      REVIEW OF SYSTEMS  PATIENT IS UNABLE TO PROVIDE COMPLETE REVIEW OF SYSTEMS DUE TO SEVERE CRITICAL ILLNESS   PHYSICAL EXAMINATION:  GENERAL:critically ill appearing, +resp distress EYES: Pupils equal, round, reactive to light.  No scleral icterus.  MOUTH: Moist mucosal membrane. INTUBATED NECK: Supple.  PULMONARY: Lungs clear to auscultation, +rhonchi CARDIOVASCULAR: S1 and S2.  Regular rate and rhythm GASTROINTESTINAL: Soft, nontender, -distended. Positive bowel sounds.  MUSCULOSKELETAL: edema.  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 placement (removed 02/15) Junctional tachycardia  With  Acute on chronic HFrEF with Cardiogenic shock leading to acute severe hypoxic resp failure, s/p extubation, then with acute PE leading to severe hypoxia and needing re-intubation  Severe ACUTE Hypoxic and Hypercapnic Respiratory Failure -continue Mechanical Ventilator support -Wean Fio2 and PEEP as tolerated -VAP/VENT bundle implementation - Wean PEEP & FiO2 as tolerated, maintain SpO2 > 88% - Head of bed elevated 30 degrees, VAP protocol in place - Plateau pressures less than 30 cm H20  - Intermittent chest x-ray & ABG PRN - Ensure adequate pulmonary hygiene  -will perform SAT/SBT when respiratory parameters are met WUA when family arrives       Moderate Right upper & Lower Pulmonary embolism -NON massive  - Systemic Heparin drip per pharmacy protocol - Follow up BLE dopplers to assess  for DVT - Echocardiogram ordered  - consider Vascular consult if IVC filter/ thrombectomy is warranted    S/p thrombectomy - extremely large clot burden removed - appreciate vascular surgery.         NEUROLOGY ACUTE METABOLIC ENCEPHALOPATHY -need for sedation WUA when family arrives MR Brain 02/14: 2 small acute infarctions affecting the cortical brain in the right frontal region.  Mild chronic small-vessel ischemic change of the cerebral hemispheric white matter with a few old small cortical infarctions scattered about the right hemisphere suggesting chronic/recurrent micro embolic infarctions. Recommend carotid evaluation. US carotid showing < 50% stenosis. MRA Head/Neck with small infarcts, and EEG without seizures. 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 SVG-RCA is occluded, but did not appear acute and unlikely the inciting factor for today's cardiac arrest Echocardiogram 04/07/23:  LVEF 40-45%, mild LVH, Grade I DD, RV systolic function not well visualized, RV size normal - diuresis with lasix as hemodynamics and renal function allow - cardiology  following, appreciate input   RENAL -continue Foley Catheter-assess need -Avoid nephrotoxic agents -Follow urine output, BMP -Ensure adequate renal perfusion, optimize oxygenation -Renal dose medications   Intake/Output Summary (Last 24 hours) at 04/20/2023 0739 Last data filed at 04/20/2023 4782 Gross per 24 hour  Intake 2659.96 ml  Output 4310 ml  Net -1650.04 ml        Latest Ref Rng & Units 04/20/2023    3:51 AM 04/19/2023     4:06 AM 04/18/2023    4:42 AM  BMP  Glucose 70 - 99 mg/dL 956  213  92   BUN 8 - 23 mg/dL 35  37  43   Creatinine 0.61 - 1.24 mg/dL 0.86  5.78  4.69   Sodium 135 - 145 mmol/L 139  138  142   Potassium 3.5 - 5.1 mmol/L 4.0  4.2  3.9   Chloride 98 - 111 mmol/L 112  112  112   CO2 22 - 32 mmol/L 23  21  21    Calcium 8.9 - 10.3 mg/dL 8.5  8.3  8.3      ENDO - ICU hypoglycemic\Hyperglycemia protocol -check FSBS per protocol   GI GI PROPHYLAXIS as indicated NUTRITIONAL STATUS DIET-->TF's as tolerated Constipation protocol as indicated   ELECTROLYTES -follow labs as needed -replace as needed -pharmacy consultation and following  RESTRICTIVE TRANSFUSION PROTOCOL TRANSFUSION  IF HGB<7  or ACTIVE BLEEDING OR DX of ACUTE CORONARY SYNDROMES    Best Practice (right click and "Reselect all SmartList Selections" daily)  Diet/type: NPO DVT prophylaxis LMWH Pressure ulcer(s): N/A GI prophylaxis: H2B Lines: Portacath present on admission and Left Upper Extremity PICC and still needed Foley:  Yes, and it is still needed Code Status:  DNR with pre-interventions Last date of multidisciplinary goals of care discussion [04/14/23]  2/22: family updated about change in clinical status and emergent intubation requiring mechanical ventilator support. Labs   CBC: Recent Labs  Lab 04/16/23 0439 04/17/23 0157 04/18/23 0442 04/18/23 1540 04/19/23 0406 04/20/23 0351  WBC 13.2* 8.9 6.8  --  5.8 5.2  NEUTROABS  --   --   --   --  4.2  --   HGB 8.6* 8.2* 7.8* 7.5* 8.7* 8.7*  HCT 25.9* 25.9* 24.0* 23.7* 26.7* 26.6*  MCV 100.0 103.6* 101.3*  --  98.5 98.9  PLT 206 177 191  --  214 215    Basic Metabolic Panel: Recent Labs  Lab 04/14/23 0432 04/15/23 0337 04/15/23 0350 04/16/23 0439 04/17/23 0157 04/18/23 0442 04/19/23 0406 04/20/23 0351  NA 150*  --  149* 144 143 142 138 139  K 3.3*  --  3.3* 3.6 3.5 3.9 4.2 4.0  CL 115*  --  117* 114* 113* 112* 112* 112*  CO2 24  --  21*  22 21* 21* 21* 23  GLUCOSE 100*  --  174* 99 97 92 103* 108*  BUN 52*  --  49* 41* 43* 43* 37* 35*  CREATININE 1.37*  --  1.48* 1.37* 1.39* 1.23 1.14 1.08  CALCIUM 8.6*  --  8.7* 8.3* 8.3* 8.3* 8.3* 8.5*  MG 2.2 2.4  --  2.2  --   --  1.9 2.0  PHOS 3.1  --  5.6* 4.0 3.6  --  3.3 3.5   GFR: Estimated Creatinine Clearance: 72.4 mL/min (by C-G formula based on SCr of 1.08 mg/dL). Recent Labs  Lab 04/15/23 [phone] 04/15/23 2841 04/15/23 3244 04/16/23 0102 04/17/23 0157 04/18/23 7253 04/19/23 0406 04/20/23 0351  PROCALCITON  --   --  0.22  --   --   --   --   --   WBC 8.3  --   --    < > 8.9 6.8 5.8 5.2  LATICACIDVEN 2.8* 1.1  --   --   --   --   --   --    < > = values in this interval not displayed.    Liver Function Tests: Recent Labs  Lab 04/14/23 0432 04/15/23 0350 04/16/23 0439 04/17/23 0157  ALBUMIN 2.8* 2.6* 2.5* 2.4*   No results for input(s): "LIPASE", "AMYLASE" in the last 168 hours. No results for input(s): "AMMONIA" in the last 168 hours.     DVT/GI PRX  assessed I Assessed the need for Labs I Assessed the need for Foley I Assessed the need for Central Venous Line Family Discussion when available I Assessed the need for Mobilization I made an Assessment of medications to be adjusted accordingly Safety Risk assessment completed  CASE DISCUSSED IN MULTIDISCIPLINARY ROUNDS WITH ICU TEAM   Critical Care Time devoted to patient care services described in this note is 55 minutes.  Critical care was necessary to treat /prevent imminent and life-threatening deterioration. Overall, patient is critically ill, prognosis is guarded.  Patient with Multiorgan failure and at high risk for cardiac arrest and death.    Lucie Leather, M.D.  [facility]  Medical Director [facility] Medical Director [facility] Cardio-Pulmonary Department

## 2023-04-20 NOTE — Progress Notes (Addendum)
 Nutrition Follow Up Note   DOCUMENTATION CODES:   Obesity unspecified  INTERVENTION:   Increase Vital HP to 87ml/hr continuous   Propofol: 18.47 ml/hr- provides 488kcal/day  Free water flushes q4 hours   Regimen provides 2168kcal/day, 147g/day protein and 2668ml/day of free water.   Daily weights   NUTRITION DIAGNOSIS:   Inadequate oral intake related to inability to eat (pt sedated and ventilated) as evidenced by NPO status. 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  -Pt s/p NGT placement 2/25; tube noted gastric but needs advancement, RN aware.   Pt tolerating tube feeds well at goal rate via NGT. Will adjust rate in r/t propofol rate decreasing. Refeed labs stable. Per chart, pt is down ~3lbs since admission. Pt -2.0L on his I & Os. Plan is for transfer LTACH.    Medications reviewed and include: aspirin, plavix, colace, pepcid, insulin, miralax, ciprofloxacin, heparin, propofol   Labs reviewed: K 4.0 wnl, BUN 35(H), P 3.5 wnl, Mg 2.0 wnl Hgb 8.7(L), Hct 26.6(L) Cbgs- 94, 108, 100, 76 x 24 hrs   Patient is currently intubated on ventilator support MV: 12.9 L/min Temp (24hrs), Avg:99.1 F (37.3 C), Min:98.6 F (37 C), Max:100.3 F (37.9 C)  Propofol: 18.47 ml/hr- provides 488kcal/day   UOP-   Diet Order:    Diet Order             Diet NPO time specified  Diet effective now                  EDUCATION NEEDS:   No education needs have been identified at this  time  Skin:  Skin Assessment: Reviewed RN Assessment (ecchymosis)  Last BM:  2/26- via rectal tube  Height:   Ht Readings from Last 1 Encounters:  04/15/23 6' 0.01" (1.829 m)    Weight:   Wt Readings from Last 1 Encounters:  04/20/23 103.7 kg    Ideal Body Weight:  80.9 kg  BMI:  Body mass index is 31 kg/m.  Estimated Nutritional Needs:   Kcal:  2000-2300kcal/day  Protein:  135-150g/day  Fluid:  2.1-2.4L/day  Betsey Holiday MS, RD, LDN If unable to be reached, please send secure chat to "RD inpatient" available from 8:00a-4:00p daily

## 2023-04-20 NOTE — Progress Notes (Signed)
 ANTICOAGULATION CONSULT NOTE  Pharmacy Consult for heparin infusion Indication: pulmonary embolus  Patient Measurements: Height: 6' 0.01" (182.9 cm) Weight: 103.7 kg (228 lb 9.9 oz) IBW/kg (Calculated) : 77.62 Heparin Dosing Weight: 99.4 kg  Labs: Recent Labs    04/18/23 0442 04/18/23 1402 04/18/23 1540 04/19/23 0010 04/19/23 0406 04/19/23 0813 04/20/23 0351 04/20/23 1416  HGB 7.8*  --  7.5*  --  8.7*  --  8.7*  --   HCT 24.0*  --  23.7*  --  26.7*  --  26.6*  --   PLT 191  --   --   --  214  --  215  --   HEPARINUNFRC 0.29*   < >  --    < >  --  0.32 0.27* 0.48  CREATININE 1.23  --   --   --  1.14  --  1.08  --    < > = values in this interval not displayed.   Estimated Creatinine Clearance: 72.4 mL/min (by C-G formula based on SCr of 1.08 mg/dL).  Medical History: Past Medical History:  Diagnosis Date   Alternating RBBB & LBBB    Ankle swelling 2024   Arthritis    neck   Bladder tumor    CAD (coronary artery disease)    a. 08/2017 STEMI/Cath: LM 80ost/d, LAD 100p, LCX 95 ost/p, RCA 60p, 78m, RPDA 80ost; b. 08/2017 CABG x 3: LIMA->LAD, VG->OM, VG->PDA.   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 08/2017 TEE: EF 30-35%, sept/ant/inf HK, apical AK. 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 12/2017 Echo: EF 45-50%, diff HK. Gr1 DD. Mildly reduced RV fxn. Mild RAE; c. 09/2022 Echo: EF 55-60%, mod LVH, GrI DD, nl RV fxn, mildly dil RA, mild MR.   Hyperlipidemia LDL goal <70    Ischemic cardiomyopathy    a. 08/2017 TEE: EF 30-35%; b. 12/2017 Echo: EF 45-50%; c. 09/2022 Echo: EF 55-60%.   Myocardial infarction (HCC) 08/27/2017   Shingles    2018 - right side   Vertigo    several months ago   Assessment: Pt is a 78 yo male presenting to admitted 2/11 following cardiac arrested now found with "moderate right-sided arterial clot burden... with occlusive clot in the right  upper lobe anterior and posterior segmental arteries, and nonoccluding thrombus in the right lower lobe main artery and superior and posterior basal segmental arteries." Pharmacy was consulted to monitor and dose this patient's heparin.   Date/Time HL Rate  Comment 2/21 1412 0.16 900 units/hr Subtherapeutic 2/22 0106 0.32 1200 un/hr Therapeutic x 1 2/22 0826 0.26 1200 units/hr Subtherapeutic 2/22 1733 0.41 1400 units/hr Therapeutic x 1 2/23 0157 0.44 1400 un/hr Therapeutic x 2 2/24 0442 0.29 1400 un/hr Subtherapeutic 2/25 0010        0.37    1600 un/hr      Therapeutic x 1 2/25 0813 0.32 1600 units/hr Therapeutic x 2  2/26 0351 0.27     1600 units/hr   SUBtherapeutic 2/26 1416 0.48 1800 units/hr, Therapeutic x 1  Goal of Therapy:  Heparin level 0.3-0.7 units/ml Monitor platelets by anticoagulation protocol: Yes   Plan:  --Heparin level is therapeutic x 1 --Continue heparin infusion at 1800 units/hr --Re-check HL in 8 hours --Daily CBC per protocol while on IV heparin  Tressie Ellis 04/20/2023 2:45 PM

## 2023-04-20 NOTE — Plan of Care (Signed)
 Problem: Education: Goal: Knowledge of General Education information will improve Description: Including pain rating scale, medication(s)/side effects and non-pharmacologic comfort measures Outcome: Progressing   Problem: Health Behavior/Discharge Planning: Goal: Ability to manage health-related needs will improve Outcome: Progressing   Problem: Clinical Measurements: Goal: Ability to maintain clinical measurements within normal limits will improve Outcome: Progressing Goal: Will remain free from infection Outcome: Progressing Goal: Diagnostic test results will improve Outcome: Progressing Goal: Respiratory complications will improve Outcome: Progressing Goal: Cardiovascular complication will be avoided Outcome: Progressing   Problem: Activity: Goal: Risk for activity intolerance will decrease Outcome: Progressing   Problem: Nutrition: Goal: Adequate nutrition will be maintained Outcome: Progressing   Problem: Coping: Goal: Level of anxiety will decrease Outcome: Progressing   Problem: Elimination: Goal: Will not experience complications related to bowel motility Outcome: Progressing Goal: Will not experience complications related to urinary retention Outcome: Progressing   Problem: Pain Managment: Goal: General experience of comfort will improve and/or be controlled Outcome: Progressing   Problem: Safety: Goal: Ability to remain free from injury will improve Outcome: Progressing   Problem: Skin Integrity: Goal: Risk for impaired skin integrity will decrease Outcome: Progressing   Problem: Education: Goal: Understanding of CV disease, CV risk reduction, and recovery process will improve Outcome: Progressing   Problem: Activity: Goal: Ability to return to baseline activity level will improve Outcome: Progressing   Problem: Cardiovascular: Goal: Ability to achieve and maintain adequate cardiovascular perfusion will improve Outcome: Progressing Goal:  Vascular access site(s) Level 0-1 will be maintained Outcome: Progressing   Problem: Health Behavior/Discharge Planning: Goal: Ability to safely manage health-related needs after discharge will improve Outcome: Progressing   Problem: Education: Goal: Understanding of CV disease, CV risk reduction, and recovery process will improve Outcome: Progressing   Problem: Activity: Goal: Ability to return to baseline activity level will improve Outcome: Progressing   Problem: Cardiovascular: Goal: Ability to achieve and maintain adequate cardiovascular perfusion will improve Outcome: Progressing Goal: Vascular access site(s) Level 0-1 will be maintained Outcome: Progressing   Problem: Health Behavior/Discharge Planning: Goal: Ability to safely manage health-related needs after discharge will improve Outcome: Progressing   Problem: Education: Goal: Ability to describe self-care measures that may prevent or decrease complications (Diabetes Survival Skills Education) will improve Outcome: Progressing   Problem: Coping: Goal: Ability to adjust to condition or change in health will improve Outcome: Progressing   Problem: Fluid Volume: Goal: Ability to maintain a balanced intake and output will improve Outcome: Progressing   Problem: Health Behavior/Discharge Planning: Goal: Ability to identify and utilize available resources and services will improve Outcome: Progressing Goal: Ability to manage health-related needs will improve Outcome: Progressing   Problem: Metabolic: Goal: Ability to maintain appropriate glucose levels will improve Outcome: Progressing   Problem: Nutritional: Goal: Maintenance of adequate nutrition will improve Outcome: Progressing Goal: Progress toward achieving an optimal weight will improve Outcome: Progressing   Problem: Skin Integrity: Goal: Risk for impaired skin integrity will decrease Outcome: Progressing   Problem: Tissue Perfusion: Goal:  Adequacy of tissue perfusion will improve Outcome: Progressing   Problem: Education: Goal: Knowledge of disease or condition will improve Outcome: Progressing Goal: Knowledge of secondary prevention will improve (MUST DOCUMENT ALL) Outcome: Progressing Goal: Knowledge of patient specific risk factors will improve (DELETE if not current risk factor) Outcome: Progressing   Problem: Ischemic Stroke/TIA Tissue Perfusion: Goal: Complications of ischemic stroke/TIA will be minimized Outcome: Progressing   Problem: Coping: Goal: Will verbalize positive feelings about self Outcome: Progressing Goal: Will identify appropriate support  needs Outcome: Progressing   Problem: Health Behavior/Discharge Planning: Goal: Ability to manage health-related needs will improve Outcome: Progressing Goal: Goals will be collaboratively established with patient/family Outcome: Progressing

## 2023-04-20 NOTE — TOC Progression Note (Signed)
 Transition of Care [facility]) - Progression Note    Patient Details  Name: Cory Hess MRN: [id number] Date of Birth: 1945-09-25  Transition [facility] Care [facility]) CM/SW Contact  Garret Reddish, RN Phone Number: 04/20/2023, 2:36 PM  Clinical Narrative:    Patient's sister  Cory Hess has meet with [facility] and [facility].  Cory Hess would like to speak with family about both options and will follow back up with team.    TOC will continue to follow for discharge planning.     Expected Discharge Plan: Long Term Acute Care (LTAC) Barriers to Discharge: Continued Medical Work up  Expected Discharge Plan and Services     Post Acute Care Choice: Long Term Acute Care (LTAC) Living arrangements for the past 2 months: Single Family Home                                       Social Determinants of Health (SDOH) Interventions SDOH Screenings   Food Insecurity: Patient Unable To Answer (04/06/2023)  Housing: Patient Unable To Answer (04/06/2023)  Transportation Needs: Patient Unable To Answer (04/06/2023)  Utilities: Patient Unable To Answer (04/06/2023)  Depression (PHQ2-9): Low Risk  (09/10/2022)  Financial Resource Strain: Low Risk  (11/06/2018)  Physical Activity: Inactive (11/06/2018)  Social Connections: Patient Unable To Answer (04/06/2023)  Stress: No Stress Concern Present (11/06/2018)  Tobacco Use: High Risk (03/29/2023)    Readmission Risk Interventions     No data to display

## 2023-04-20 NOTE — Progress Notes (Signed)
 ANTICOAGULATION CONSULT NOTE  Pharmacy Consult for heparin infusion Indication: pulmonary embolus  No Known Allergies  Patient Measurements: Height: 6' 0.01" (182.9 cm) Weight: 103.7 kg (228 lb 9.9 oz) IBW/kg (Calculated) : 77.62 Heparin Dosing Weight: 99.4 kg  Vital Signs: Temp: 98.9 F (37.2 C) (02/26 0400) Temp Source: Axillary (02/26 0400) BP: 131/60 (02/26 0500) Pulse Rate: 93 (02/26 0500)  Labs: Recent Labs    04/18/23 0442 04/18/23 1402 04/18/23 1540 04/19/23 0010 04/19/23 0406 04/19/23 0813 04/20/23 0351  HGB 7.8*  --  7.5*  --  8.7*  --  8.7*  HCT 24.0*  --  23.7*  --  26.7*  --  26.6*  PLT 191  --   --   --  214  --  215  HEPARINUNFRC 0.29*   < >  --  0.37  --  0.32 0.27*  CREATININE 1.23  --   --   --  1.14  --  1.08   < > = values in this interval not displayed.   Estimated Creatinine Clearance: 72.4 mL/min (by C-G formula based on SCr of 1.08 mg/dL).  Medical History: Past Medical History:  Diagnosis Date   Alternating RBBB & LBBB    Ankle swelling 2024   Arthritis    neck   Bladder tumor    CAD (coronary artery disease)    a. 08/2017 STEMI/Cath: LM 80ost/d, LAD 100p, LCX 95 ost/p, RCA 60p, 64m, RPDA 80ost; b. 08/2017 CABG x 3: LIMA->LAD, VG->OM, VG->PDA.   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 08/2017 TEE: EF 30-35%, sept/ant/inf HK, apical AK. 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 12/2017 Echo: EF 45-50%, diff HK. Gr1 DD. Mildly reduced RV fxn. Mild RAE; c. 09/2022 Echo: EF 55-60%, mod LVH, GrI DD, nl RV fxn, mildly dil RA, mild MR.   Hyperlipidemia LDL goal <70    Ischemic cardiomyopathy    a. 08/2017 TEE: EF 30-35%; b. 12/2017 Echo: EF 45-50%; c. 09/2022 Echo: EF 55-60%.   Myocardial infarction (HCC) 08/27/2017   Shingles    2018 - right side   Vertigo    several months ago   Assessment: Pt is a 78 yo male presenting to admitted  2/11 following cardiac arrested now found with "moderate right-sided arterial clot burden... with occlusive clot in the right upper lobe anterior and posterior segmental arteries, and nonoccluding thrombus in the right lower lobe main artery and superior and posterior basal segmental arteries." Pharmacy was consulted to monitor and dose this patient's heparin.   Date/Time HL Rate  Comment 2/21 1412 0.16 900 units/hr Subtherapeutic 2/22 0106 0.32 1200 un/hr Therapeutic x 1 2/22 0826 0.26 1200 units/hr Subtherapeutic 2/22 1733 0.41 1400 units/hr Therapeutic x 1 2/23 0157 0.44 1400 un/hr Therapeutic x 2 2/24 0442 0.29 1400 un/hr Subtherapeutic Held 2/25 0010        0.37    1600 un/hr      Thera X 1 after restart 2/25@0813  0.32 1600 units/hr Therapeutic x 2  2/26@0351      0.27     1600 units/hr   SUBtherapeutic Goal of Therapy:  Heparin level 0.3-0.7 units/ml Monitor platelets by anticoagulation protocol: Yes   Plan:  2/26: HL @ 0351 = 0.27, SUBtherapeutic  - will order heparin 1500 units IV X 1 bolus and increase drip rate to 1800 units/hr -  will recheck HL 8 hrs after rate change  Thank you for allowing pharmacy to participate in this patient's care.   Xavion Muscat D, PharmD 04/20/2023 5:52 AM

## 2023-04-20 NOTE — Plan of Care (Signed)
  Problem: Health Behavior/Discharge Planning: Goal: Ability to manage health-related needs will improve Outcome: Progressing   Problem: Clinical Measurements: Goal: Diagnostic test results will improve Outcome: Progressing Goal: Respiratory complications will improve Outcome: Progressing Goal: Cardiovascular complication will be avoided Outcome: Progressing   Problem: Skin Integrity: Goal: Risk for impaired skin integrity will decrease Outcome: Progressing   Problem: Education: Goal: Understanding of CV disease, CV risk reduction, and recovery process will improve Outcome: Progressing   Problem: Cardiovascular: Goal: Vascular access site(s) Level 0-1 will be maintained Outcome: Progressing Note:  Assess vascular access site(s) for complications and intervene as needed

## 2023-04-20 NOTE — Plan of Care (Signed)
  Problem: Clinical Measurements: Goal: Diagnostic test results will improve Outcome: Progressing Goal: Respiratory complications will improve Outcome: Progressing Goal: Cardiovascular complication will be avoided Outcome: Progressing   Problem: Elimination: Goal: Will not experience complications related to urinary retention Outcome: Progressing   Problem: Fluid Volume: Goal: Ability to maintain a balanced intake and output will improve Outcome: Progressing   Problem: Cardiovascular: Goal: Vascular access site(s) Level 0-1 will be maintained Outcome: Progressing

## 2023-04-20 NOTE — Progress Notes (Signed)
 ANTICOAGULATION CONSULT NOTE  Pharmacy Consult for heparin infusion Indication: pulmonary embolus  Patient Measurements: Height: 6' 0.01" (182.9 cm) Weight: 103.7 kg (228 lb 9.9 oz) IBW/kg (Calculated) : 77.62 Heparin Dosing Weight: 99.4 kg  Labs: Recent Labs    04/18/23 0442 04/18/23 1402 04/18/23 1540 04/19/23 0010 04/19/23 0406 04/19/23 0813 04/20/23 0351 04/20/23 1416 04/20/23 2233  HGB 7.8*  --  7.5*  --  8.7*  --  8.7*  --   --   HCT 24.0*  --  23.7*  --  26.7*  --  26.6*  --   --   PLT 191  --   --   --  214  --  215  --   --   HEPARINUNFRC 0.29*   < >  --    < >  --    < > 0.27* 0.48 0.43  CREATININE 1.23  --   --   --  1.14  --  1.08  --   --    < > = values in this interval not displayed.   Estimated Creatinine Clearance: 72.4 mL/min (by C-G formula based on SCr of 1.08 mg/dL).  Medical History: Past Medical History:  Diagnosis Date   Alternating RBBB & LBBB    Ankle swelling 2024   Arthritis    neck   Bladder tumor    CAD (coronary artery disease)    a. 08/2017 STEMI/Cath: LM 80ost/d, LAD 100p, LCX 95 ost/p, RCA 60p, 14m, RPDA 80ost; b. 08/2017 CABG x 3: LIMA->LAD, VG->OM, VG->PDA.   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 08/2017 TEE: EF 30-35%, sept/ant/inf HK, apical AK. 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 12/2017 Echo: EF 45-50%, diff HK. Gr1 DD. Mildly reduced RV fxn. Mild RAE; c. 09/2022 Echo: EF 55-60%, mod LVH, GrI DD, nl RV fxn, mildly dil RA, mild MR.   Hyperlipidemia LDL goal <70    Ischemic cardiomyopathy    a. 08/2017 TEE: EF 30-35%; b. 12/2017 Echo: EF 45-50%; c. 09/2022 Echo: EF 55-60%.   Myocardial infarction (HCC) 08/27/2017   Shingles    2018 - right side   Vertigo    several months ago   Assessment: Pt is a 78 yo male presenting to admitted 2/11 following cardiac arrested now found with "moderate right-sided arterial clot  burden... with occlusive clot in the right upper lobe anterior and posterior segmental arteries, and nonoccluding thrombus in the right lower lobe main artery and superior and posterior basal segmental arteries." Pharmacy was consulted to monitor and dose this patient's heparin.   Date/Time HL Rate  Comment 2/21 1412 0.16 900 units/hr Subtherapeutic 2/22 0106 0.32 1200 un/hr Therapeutic x 1 2/22 0826 0.26 1200 units/hr Subtherapeutic 2/22 1733 0.41 1400 units/hr Therapeutic x 1 2/23 0157 0.44 1400 un/hr Therapeutic x 2 2/24 0442 0.29 1400 un/hr Subtherapeutic 2/25 0010        0.37    1600 un/hr      Therapeutic x 1 2/25 0813 0.32 1600 units/hr Therapeutic x 2  2/26 0351 0.27     1600 units/hr   SUBtherapeutic 2/26 1416 0.48 1800 units/hr, Therapeutic x 1 2/26 2233       0.43     1800 units/hr, Therapeutic X 2   Goal of Therapy:  Heparin level 0.3-0.7 units/ml Monitor platelets by anticoagulation protocol:  Yes   Plan:  --Heparin level is therapeutic x 2 --Continue heparin infusion at 1800 units/hr --Re-check HL on 2/27 with AM labs.  --Daily CBC per protocol while on IV heparin  Charlynn Salih D 04/20/2023 11:06 PM

## 2023-04-20 NOTE — Progress Notes (Addendum)
  Progress Note    04/20/2023 1:35 PM 2 Days Post-Op  Subjective:    Cory Hess is a 78 yo male now POD #2 from pulmonary thrombectomy.  Patient remains sedated and intubated this morning.  Patient is currently on 40% FiO2 via ventilator with a PEEP of 5 and strong oxygen saturations ranging from 96 to 98%.  Vitals all remained stable.    Vitals:   04/20/23 1245 04/20/23 1300  BP:  (!) 126/55  Pulse: 78 79  Resp: 17 16  Temp:    SpO2: 98% 99%   Physical Exam: Cardiac:  RRR, normal S1 and S2 Lungs: Lungs clear to auscultation with some scattered rhonchi and scattered wheezing. Incisions: Right groin access with dressing clean dry and intact.  No hematoma seroma to note. Extremities: Palpable pulses throughout.  All extremities warm to touch. Abdomen: Positive bowel sounds throughout, soft, nontender nondistended.  NG tube with tube feeds running. Neurologic: Patient sedated and ventilated.  CBC    Component Value Date/Time   WBC 5.2 04/20/2023 0351   RBC 2.69 (L) 04/20/2023 0351   HGB 8.7 (L) 04/20/2023 0351   HGB 10.3 (L) 01/14/2023 0927   HGB 14.6 04/01/2021 0933   HCT 26.6 (L) 04/20/2023 0351   HCT 43.4 04/01/2021 0933   PLT 215 04/20/2023 0351   PLT 115 (L) 01/14/2023 0927   PLT 151 04/01/2021 0933   MCV 98.9 04/20/2023 0351   MCV 92 04/01/2021 0933   MCH 32.3 04/20/2023 0351   MCHC 32.7 04/20/2023 0351   RDW 14.6 04/20/2023 0351   RDW 12.3 04/01/2021 0933   LYMPHSABS 0.7 04/19/2023 0406   MONOABS 0.6 04/19/2023 0406   EOSABS 0.2 04/19/2023 0406   BASOSABS 0.0 04/19/2023 0406    BMET    Component Value Date/Time   NA 139 04/20/2023 0351   NA 141 10/05/2022 1629   K 4.0 04/20/2023 0351   CL 112 (H) 04/20/2023 0351   CO2 23 04/20/2023 0351   GLUCOSE 108 (H) 04/20/2023 0351   BUN 35 (H) 04/20/2023 0351   BUN 11 10/05/2022 1629   CREATININE 1.08 04/20/2023 0351   CREATININE 1.56 (H) 01/14/2023 0927   CALCIUM 8.5 (L) 04/20/2023 0351   GFRNONAA >60  04/20/2023 0351   GFRNONAA 46 (L) 01/14/2023 0927   GFRAA >60 12/21/2018 0937    INR    Component Value Date/Time   INR 1.4 (H) 04/05/2023 1703     Intake/Output Summary (Last 24 hours) at 04/20/2023 1335 Last data filed at 04/20/2023 1201 Gross per 24 hour  Intake 2856.96 ml  Output 3600 ml  Net -743.04 ml     Assessment/Plan:  78 y.o. male is s/p pulmonary thrombectomy.  2 Days Post-Op   PLAN Per vascular surgery okay to stop heparin infusion and started on oral anticoagulation once patient is medically stable by the ICU team.  Vascular surgery recommends Eliquis 5 mg twice daily. Currently the patient is on aspirin 81 mg daily and Plavix 75 mg daily due to his cardiac status postcatheterization.  Vascular surgery recommends either aspirin with Eliquis or Plavix with Eliquis but not both at this time.  Follow-up with cardiology as to which they prefer.   DVT prophylaxis: Heparin infusion, ASA 81 mg daily, Plavix 75 mg daily   Marcie Bal [facility] 04/20/2023 1:35 PM

## 2023-04-20 NOTE — TOC Progression Note (Signed)
 Transition of Care [facility]) - Progression Note    Patient Details  Name: Cory Hess MRN: [id number] Date of Birth: 1945-10-08  [facility]) CM/SW Contact  Garret Reddish, RN Phone Number: 04/20/2023, 11:42 AM  Clinical Narrative:    Chart reviewed.  Spoke with patient's sister [name].  She reports that she has spoken with [facility] and would like to speak with [facility].  I have made Ciera aware and she will reach out to [name] between 2:15 pm and 2:30 pm per [name]'s request.    TOC will continue to follow for discharge planning.     Expected Discharge Plan: Long Term Acute Care (LTAC) Barriers to Discharge: Continued Medical Work up  Expected Discharge Plan and Services     Post Acute Care Choice: Long Term Acute Care (LTAC) Living arrangements for the past 2 months: Single Family Home                                       Social Determinants of Health (SDOH) Interventions SDOH Screenings   Food Insecurity: Patient Unable To Answer (04/06/2023)  Housing: Patient Unable To Answer (04/06/2023)  Transportation Needs: Patient Unable To Answer (04/06/2023)  Utilities: Patient Unable To Answer (04/06/2023)  Depression (PHQ2-9): Low Risk  (09/10/2022)  Financial Resource Strain: Low Risk  (11/06/2018)  Physical Activity: Inactive (11/06/2018)  Social Connections: Patient Unable To Answer (04/06/2023)  Stress: No Stress Concern Present (11/06/2018)  Tobacco Use: High Risk (03/29/2023)    Readmission Risk Interventions     No data to display

## 2023-04-21 ENCOUNTER — Other Ambulatory Visit: Payer: Self-pay

## 2023-04-21 DIAGNOSIS — E049 Nontoxic goiter, unspecified: Secondary | ICD-10-CM | POA: Diagnosis present

## 2023-04-21 DIAGNOSIS — R188 Other ascites: Secondary | ICD-10-CM | POA: Diagnosis not present

## 2023-04-21 DIAGNOSIS — J9601 Acute respiratory failure with hypoxia: Secondary | ICD-10-CM | POA: Diagnosis not present

## 2023-04-21 DIAGNOSIS — E042 Nontoxic multinodular goiter: Secondary | ICD-10-CM | POA: Diagnosis not present

## 2023-04-21 DIAGNOSIS — Z743 Need for continuous supervision: Secondary | ICD-10-CM | POA: Diagnosis not present

## 2023-04-21 DIAGNOSIS — C7989 Secondary malignant neoplasm of other specified sites: Secondary | ICD-10-CM | POA: Diagnosis not present

## 2023-04-21 DIAGNOSIS — F05 Delirium due to known physiological condition: Secondary | ICD-10-CM | POA: Diagnosis not present

## 2023-04-21 DIAGNOSIS — Z8589 Personal history of malignant neoplasm of other organs and systems: Secondary | ICD-10-CM | POA: Diagnosis not present

## 2023-04-21 DIAGNOSIS — S51801A Unspecified open wound of right forearm, initial encounter: Secondary | ICD-10-CM | POA: Diagnosis not present

## 2023-04-21 DIAGNOSIS — Z1152 Encounter for screening for COVID-19: Secondary | ICD-10-CM | POA: Diagnosis not present

## 2023-04-21 DIAGNOSIS — G8929 Other chronic pain: Secondary | ICD-10-CM | POA: Diagnosis not present

## 2023-04-21 DIAGNOSIS — I6340 Cerebral infarction due to embolism of unspecified cerebral artery: Secondary | ICD-10-CM | POA: Diagnosis not present

## 2023-04-21 DIAGNOSIS — Z930 Tracheostomy status: Secondary | ICD-10-CM | POA: Diagnosis not present

## 2023-04-21 DIAGNOSIS — Z95828 Presence of other vascular implants and grafts: Secondary | ICD-10-CM | POA: Diagnosis not present

## 2023-04-21 DIAGNOSIS — I462 Cardiac arrest due to underlying cardiac condition: Secondary | ICD-10-CM | POA: Diagnosis not present

## 2023-04-21 DIAGNOSIS — Z950 Presence of cardiac pacemaker: Secondary | ICD-10-CM | POA: Diagnosis not present

## 2023-04-21 DIAGNOSIS — J9602 Acute respiratory failure with hypercapnia: Secondary | ICD-10-CM | POA: Diagnosis present

## 2023-04-21 DIAGNOSIS — J9622 Acute and chronic respiratory failure with hypercapnia: Secondary | ICD-10-CM | POA: Diagnosis not present

## 2023-04-21 DIAGNOSIS — Z8673 Personal history of transient ischemic attack (TIA), and cerebral infarction without residual deficits: Secondary | ICD-10-CM | POA: Diagnosis not present

## 2023-04-21 DIAGNOSIS — Z9911 Dependence on respirator [ventilator] status: Secondary | ICD-10-CM | POA: Diagnosis not present

## 2023-04-21 DIAGNOSIS — Z66 Do not resuscitate: Secondary | ICD-10-CM | POA: Diagnosis not present

## 2023-04-21 DIAGNOSIS — N1831 Chronic kidney disease, stage 3a: Secondary | ICD-10-CM | POA: Diagnosis not present

## 2023-04-21 DIAGNOSIS — R6521 Severe sepsis with septic shock: Secondary | ICD-10-CM | POA: Diagnosis not present

## 2023-04-21 DIAGNOSIS — Z794 Long term (current) use of insulin: Secondary | ICD-10-CM | POA: Diagnosis not present

## 2023-04-21 DIAGNOSIS — R52 Pain, unspecified: Secondary | ICD-10-CM | POA: Diagnosis not present

## 2023-04-21 DIAGNOSIS — R1312 Dysphagia, oropharyngeal phase: Secondary | ICD-10-CM | POA: Diagnosis present

## 2023-04-21 DIAGNOSIS — I130 Hypertensive heart and chronic kidney disease with heart failure and stage 1 through stage 4 chronic kidney disease, or unspecified chronic kidney disease: Secondary | ICD-10-CM | POA: Diagnosis not present

## 2023-04-21 DIAGNOSIS — R1310 Dysphagia, unspecified: Secondary | ICD-10-CM | POA: Diagnosis not present

## 2023-04-21 DIAGNOSIS — I639 Cerebral infarction, unspecified: Secondary | ICD-10-CM | POA: Diagnosis not present

## 2023-04-21 DIAGNOSIS — J398 Other specified diseases of upper respiratory tract: Secondary | ICD-10-CM | POA: Diagnosis not present

## 2023-04-21 DIAGNOSIS — R112 Nausea with vomiting, unspecified: Secondary | ICD-10-CM | POA: Diagnosis not present

## 2023-04-21 DIAGNOSIS — Z8674 Personal history of sudden cardiac arrest: Secondary | ICD-10-CM | POA: Diagnosis not present

## 2023-04-21 DIAGNOSIS — M6281 Muscle weakness (generalized): Secondary | ICD-10-CM | POA: Diagnosis not present

## 2023-04-21 DIAGNOSIS — J9611 Chronic respiratory failure with hypoxia: Secondary | ICD-10-CM | POA: Diagnosis not present

## 2023-04-21 DIAGNOSIS — E119 Type 2 diabetes mellitus without complications: Secondary | ICD-10-CM | POA: Diagnosis not present

## 2023-04-21 DIAGNOSIS — R2689 Other abnormalities of gait and mobility: Secondary | ICD-10-CM | POA: Diagnosis not present

## 2023-04-21 DIAGNOSIS — N170 Acute kidney failure with tubular necrosis: Secondary | ICD-10-CM | POA: Diagnosis not present

## 2023-04-21 DIAGNOSIS — I5023 Acute on chronic systolic (congestive) heart failure: Secondary | ICD-10-CM | POA: Diagnosis not present

## 2023-04-21 DIAGNOSIS — R0603 Acute respiratory distress: Secondary | ICD-10-CM | POA: Diagnosis not present

## 2023-04-21 DIAGNOSIS — G893 Neoplasm related pain (acute) (chronic): Secondary | ICD-10-CM | POA: Diagnosis not present

## 2023-04-21 DIAGNOSIS — I509 Heart failure, unspecified: Secondary | ICD-10-CM | POA: Diagnosis not present

## 2023-04-21 DIAGNOSIS — R1311 Dysphagia, oral phase: Secondary | ICD-10-CM | POA: Diagnosis not present

## 2023-04-21 DIAGNOSIS — I214 Non-ST elevation (NSTEMI) myocardial infarction: Secondary | ICD-10-CM | POA: Diagnosis not present

## 2023-04-21 DIAGNOSIS — K219 Gastro-esophageal reflux disease without esophagitis: Secondary | ICD-10-CM | POA: Diagnosis not present

## 2023-04-21 DIAGNOSIS — I69398 Other sequelae of cerebral infarction: Secondary | ICD-10-CM | POA: Diagnosis not present

## 2023-04-21 DIAGNOSIS — Y831 Surgical operation with implant of artificial internal device as the cause of abnormal reaction of the patient, or of later complication, without mention of misadventure at the time of the procedure: Secondary | ICD-10-CM | POA: Diagnosis not present

## 2023-04-21 DIAGNOSIS — Z951 Presence of aortocoronary bypass graft: Secondary | ICD-10-CM | POA: Diagnosis not present

## 2023-04-21 DIAGNOSIS — R918 Other nonspecific abnormal finding of lung field: Secondary | ICD-10-CM | POA: Diagnosis not present

## 2023-04-21 DIAGNOSIS — Z9981 Dependence on supplemental oxygen: Secondary | ICD-10-CM | POA: Diagnosis not present

## 2023-04-21 DIAGNOSIS — J151 Pneumonia due to Pseudomonas: Secondary | ICD-10-CM | POA: Diagnosis present

## 2023-04-21 DIAGNOSIS — E669 Obesity, unspecified: Secondary | ICD-10-CM | POA: Diagnosis not present

## 2023-04-21 DIAGNOSIS — I10 Essential (primary) hypertension: Secondary | ICD-10-CM | POA: Diagnosis not present

## 2023-04-21 DIAGNOSIS — G9341 Metabolic encephalopathy: Secondary | ICD-10-CM | POA: Diagnosis not present

## 2023-04-21 DIAGNOSIS — R0989 Other specified symptoms and signs involving the circulatory and respiratory systems: Secondary | ICD-10-CM | POA: Diagnosis not present

## 2023-04-21 DIAGNOSIS — T80219A Unspecified infection due to central venous catheter, initial encounter: Secondary | ICD-10-CM | POA: Diagnosis present

## 2023-04-21 DIAGNOSIS — J181 Lobar pneumonia, unspecified organism: Secondary | ICD-10-CM | POA: Diagnosis not present

## 2023-04-21 DIAGNOSIS — R570 Cardiogenic shock: Secondary | ICD-10-CM | POA: Diagnosis not present

## 2023-04-21 DIAGNOSIS — J188 Other pneumonia, unspecified organism: Secondary | ICD-10-CM | POA: Diagnosis not present

## 2023-04-21 DIAGNOSIS — R4189 Other symptoms and signs involving cognitive functions and awareness: Secondary | ICD-10-CM | POA: Diagnosis not present

## 2023-04-21 DIAGNOSIS — R5381 Other malaise: Secondary | ICD-10-CM | POA: Diagnosis not present

## 2023-04-21 DIAGNOSIS — J9691 Respiratory failure, unspecified with hypoxia: Secondary | ICD-10-CM | POA: Diagnosis not present

## 2023-04-21 DIAGNOSIS — Z4682 Encounter for fitting and adjustment of non-vascular catheter: Secondary | ICD-10-CM | POA: Diagnosis not present

## 2023-04-21 DIAGNOSIS — N400 Enlarged prostate without lower urinary tract symptoms: Secondary | ICD-10-CM | POA: Diagnosis not present

## 2023-04-21 DIAGNOSIS — C01 Malignant neoplasm of base of tongue: Secondary | ICD-10-CM | POA: Diagnosis not present

## 2023-04-21 DIAGNOSIS — Z9989 Dependence on other enabling machines and devices: Secondary | ICD-10-CM | POA: Diagnosis not present

## 2023-04-21 DIAGNOSIS — J9621 Acute and chronic respiratory failure with hypoxia: Secondary | ICD-10-CM | POA: Diagnosis not present

## 2023-04-21 DIAGNOSIS — E063 Autoimmune thyroiditis: Secondary | ICD-10-CM | POA: Diagnosis present

## 2023-04-21 DIAGNOSIS — I5043 Acute on chronic combined systolic (congestive) and diastolic (congestive) heart failure: Secondary | ICD-10-CM | POA: Diagnosis not present

## 2023-04-21 DIAGNOSIS — I96 Gangrene, not elsewhere classified: Secondary | ICD-10-CM | POA: Diagnosis present

## 2023-04-21 DIAGNOSIS — T82598A Other mechanical complication of other cardiac and vascular devices and implants, initial encounter: Secondary | ICD-10-CM | POA: Diagnosis not present

## 2023-04-21 DIAGNOSIS — J9690 Respiratory failure, unspecified, unspecified whether with hypoxia or hypercapnia: Secondary | ICD-10-CM | POA: Diagnosis not present

## 2023-04-21 DIAGNOSIS — C770 Secondary and unspecified malignant neoplasm of lymph nodes of head, face and neck: Secondary | ICD-10-CM | POA: Diagnosis not present

## 2023-04-21 DIAGNOSIS — I469 Cardiac arrest, cause unspecified: Secondary | ICD-10-CM | POA: Diagnosis not present

## 2023-04-21 DIAGNOSIS — I2699 Other pulmonary embolism without acute cor pulmonale: Secondary | ICD-10-CM | POA: Diagnosis not present

## 2023-04-21 DIAGNOSIS — C099 Malignant neoplasm of tonsil, unspecified: Secondary | ICD-10-CM | POA: Diagnosis not present

## 2023-04-21 DIAGNOSIS — J690 Pneumonitis due to inhalation of food and vomit: Secondary | ICD-10-CM | POA: Diagnosis not present

## 2023-04-21 DIAGNOSIS — C029 Malignant neoplasm of tongue, unspecified: Secondary | ICD-10-CM | POA: Diagnosis not present

## 2023-04-21 DIAGNOSIS — F411 Generalized anxiety disorder: Secondary | ICD-10-CM | POA: Diagnosis not present

## 2023-04-21 DIAGNOSIS — G8911 Acute pain due to trauma: Secondary | ICD-10-CM | POA: Diagnosis not present

## 2023-04-21 DIAGNOSIS — Z85818 Personal history of malignant neoplasm of other sites of lip, oral cavity, and pharynx: Secondary | ICD-10-CM | POA: Diagnosis not present

## 2023-04-21 DIAGNOSIS — I5022 Chronic systolic (congestive) heart failure: Secondary | ICD-10-CM | POA: Diagnosis not present

## 2023-04-21 DIAGNOSIS — A419 Sepsis, unspecified organism: Secondary | ICD-10-CM | POA: Diagnosis not present

## 2023-04-21 DIAGNOSIS — I2510 Atherosclerotic heart disease of native coronary artery without angina pectoris: Secondary | ICD-10-CM | POA: Diagnosis present

## 2023-04-21 DIAGNOSIS — I110 Hypertensive heart disease with heart failure: Secondary | ICD-10-CM | POA: Diagnosis present

## 2023-04-21 DIAGNOSIS — Z515 Encounter for palliative care: Secondary | ICD-10-CM | POA: Diagnosis not present

## 2023-04-21 DIAGNOSIS — S41101A Unspecified open wound of right upper arm, initial encounter: Secondary | ICD-10-CM | POA: Diagnosis not present

## 2023-04-21 DIAGNOSIS — J9600 Acute respiratory failure, unspecified whether with hypoxia or hypercapnia: Secondary | ICD-10-CM | POA: Diagnosis not present

## 2023-04-21 DIAGNOSIS — R109 Unspecified abdominal pain: Secondary | ICD-10-CM | POA: Diagnosis not present

## 2023-04-21 LAB — GLUCOSE, CAPILLARY
Glucose-Capillary: 107 mg/dL — ABNORMAL HIGH (ref 70–99)
Glucose-Capillary: 109 mg/dL — ABNORMAL HIGH (ref 70–99)
Glucose-Capillary: 113 mg/dL — ABNORMAL HIGH (ref 70–99)
Glucose-Capillary: 119 mg/dL — ABNORMAL HIGH (ref 70–99)

## 2023-04-21 LAB — BASIC METABOLIC PANEL
Anion gap: 6 (ref 5–15)
BUN: 38 mg/dL — ABNORMAL HIGH (ref 8–23)
CO2: 22 mmol/L (ref 22–32)
Calcium: 8.4 mg/dL — ABNORMAL LOW (ref 8.9–10.3)
Chloride: 111 mmol/L (ref 98–111)
Creatinine, Ser: 1.05 mg/dL (ref 0.61–1.24)
GFR, Estimated: >60 mL/min (ref >60)
Glucose, Bld: 112 mg/dL — ABNORMAL HIGH (ref 70–99)
Potassium: 4.2 mmol/L (ref 3.5–5.1)
Sodium: 139 mmol/L (ref 135–145)

## 2023-04-21 LAB — CBC
HCT: 27.3 % — ABNORMAL LOW (ref 39.0–52.0)
Hemoglobin: 8.9 g/dL — ABNORMAL LOW (ref 13.0–17.0)
MCH: 31.8 pg (ref 26.0–34.0)
MCHC: 32.6 g/dL (ref 30.0–36.0)
MCV: 97.5 fL (ref 80.0–100.0)
Platelets: 224 10*3/uL (ref 150–400)
RBC: 2.8 MIL/uL — ABNORMAL LOW (ref 4.22–5.81)
RDW: 14.3 % (ref 11.5–15.5)
WBC: 5.4 10*3/uL (ref 4.0–10.5)
nRBC: 0 % (ref 0.0–0.2)

## 2023-04-21 LAB — PHOSPHORUS: Phosphorus: 3.6 mg/dL (ref 2.5–4.6)

## 2023-04-21 LAB — HEPARIN LEVEL (UNFRACTIONATED): Heparin Unfractionated: 0.41 [IU]/mL (ref 0.30–0.70)

## 2023-04-21 LAB — MAGNESIUM: Magnesium: 1.9 mg/dL (ref 1.7–2.4)

## 2023-04-21 MED ORDER — FREE WATER: 200.0000 mL | Status: DC

## 2023-04-21 MED ORDER — DOCUSATE SODIUM 50 MG/5ML PO LIQD: 100.0000 mg | Freq: Two times a day (BID) | ORAL | Status: DC

## 2023-04-21 MED ORDER — DOCUSATE SODIUM 50 MG/5ML PO LIQD: 100.0000 mg | Freq: Two times a day (BID) | ORAL | Status: DC | PRN

## 2023-04-21 MED ORDER — ENOXAPARIN SODIUM 100 MG/ML IJ SOSY: 100.0000 mg | PREFILLED_SYRINGE | Freq: Two times a day (BID) | INTRAMUSCULAR | Status: DC

## 2023-04-21 MED ORDER — SODIUM CHLORIDE 0.9% FLUSH: 10.0000 mL | Freq: Two times a day (BID) | INTRAVENOUS | Status: DC

## 2023-04-21 MED ORDER — FENTANYL BOLUS VIA INFUSION: 25.0000 ug | INTRAVENOUS | Status: DC | PRN

## 2023-04-21 MED ORDER — HYDRALAZINE HCL 20 MG/ML IJ SOLN: 10.0000 mg | INTRAMUSCULAR | Status: DC | PRN

## 2023-04-21 MED ORDER — ORAL CARE MOUTH RINSE: 15.0000 mL | OROMUCOSAL | Status: DC

## 2023-04-21 MED ORDER — SODIUM CHLORIDE 0.9% FLUSH
10.0000 mL | INTRAVENOUS | Status: DC | PRN
Start: 2023-04-21 — End: 2023-07-07

## 2023-04-21 MED ORDER — VITAL HIGH PROTEIN PO LIQD: 1000.0000 mL | ORAL | Status: DC

## 2023-04-21 MED ORDER — ACETAMINOPHEN 325 MG PO TABS: 650.0000 mg | ORAL_TABLET | ORAL | Status: DC | PRN

## 2023-04-21 MED ORDER — FENTANYL 2500MCG IN NS 250ML (10MCG/ML) PREMIX INFUSION
25.0000 ug/h | INTRAVENOUS | Status: DC
Start: 2023-04-21 — End: 2023-07-07

## 2023-04-21 MED ORDER — ENOXAPARIN SODIUM 100 MG/ML IJ SOSY
102.0000 mg | PREFILLED_SYRINGE | Freq: Two times a day (BID) | INTRAMUSCULAR | Status: DC
  Filled 2023-04-21: qty 2

## 2023-04-21 MED ORDER — ATORVASTATIN CALCIUM 80 MG PO TABS: 80.0000 mg | ORAL_TABLET | Freq: Every day | ORAL | Status: DC

## 2023-04-21 MED ORDER — ENOXAPARIN SODIUM 100 MG/ML IJ SOSY
100.0000 mg | PREFILLED_SYRINGE | Freq: Two times a day (BID) | INTRAMUSCULAR | Status: DC
  Administered 2023-04-21: 100 mg via SUBCUTANEOUS
  Filled 2023-04-21 (×3): qty 1

## 2023-04-21 MED ORDER — ENOXAPARIN SODIUM 60 MG/0.6ML IJ SOSY
50.0000 mg | PREFILLED_SYRINGE | INTRAMUSCULAR | Status: DC
  Filled 2023-04-21: qty 0.6

## 2023-04-21 MED ORDER — INSULIN ASPART 100 UNIT/ML IJ SOLN: 0.0000 [IU] | INTRAMUSCULAR | Status: DC

## 2023-04-21 MED ORDER — LIDOCAINE 5 % EX PTCH: 1.0000 | MEDICATED_PATCH | CUTANEOUS | Status: DC

## 2023-04-21 MED ORDER — CLOPIDOGREL BISULFATE 75 MG PO TABS: 75.0000 mg | ORAL_TABLET | Freq: Every day | ORAL | Status: DC

## 2023-04-21 MED ORDER — HALOPERIDOL LACTATE 5 MG/ML IJ SOLN: 2.0000 mg | Freq: Four times a day (QID) | INTRAMUSCULAR | Status: DC | PRN

## 2023-04-21 MED ORDER — ASPIRIN 81 MG PO CHEW: 81.0000 mg | CHEWABLE_TABLET | Freq: Every day | ORAL | Status: DC

## 2023-04-21 MED ORDER — FAMOTIDINE 20 MG PO TABS: 20.0000 mg | ORAL_TABLET | Freq: Every day | ORAL | Status: DC

## 2023-04-21 MED ORDER — POLYETHYLENE GLYCOL 3350 17 G PO PACK: 17.0000 g | PACK | Freq: Every day | ORAL | Status: DC | PRN

## 2023-04-21 MED ORDER — POLYETHYLENE GLYCOL 3350 17 G PO PACK: 17.0000 g | PACK | Freq: Every day | ORAL | Status: DC

## 2023-04-21 MED ORDER — IPRATROPIUM-ALBUTEROL 0.5-2.5 (3) MG/3ML IN SOLN: 3.0000 mL | Freq: Four times a day (QID) | RESPIRATORY_TRACT | Status: DC | PRN

## 2023-04-21 MED ORDER — ONDANSETRON HCL 4 MG/2ML IJ SOLN: 4.0000 mg | Freq: Four times a day (QID) | INTRAMUSCULAR | Status: DC | PRN

## 2023-04-21 MED ORDER — MIDAZOLAM HCL 2 MG/2ML IJ SOLN: 1.0000 mg | INTRAMUSCULAR | Status: DC | PRN

## 2023-04-21 MED ORDER — CHLORHEXIDINE GLUCONATE CLOTH 2 % EX PADS: 6.0000 | MEDICATED_PAD | Freq: Every day | CUTANEOUS | Status: DC

## 2023-04-21 MED ORDER — PROPOFOL 1000 MG/100ML IV EMUL
5.0000 ug/kg/min | INTRAVENOUS | Status: DC
Start: 2023-04-21 — End: 2023-07-07

## 2023-04-21 MED ORDER — CIPROFLOXACIN IN D5W 400 MG/200ML IV SOLN: 400.0000 mg | Freq: Three times a day (TID) | INTRAVENOUS | Status: DC

## 2023-04-21 NOTE — TOC Transition Note (Signed)
 Transition of Care [facility]) - Discharge Note   Patient Details  Name: Cory Hess MRN: [id number] Date of Birth: 1946/01/07  [facility]) CM/SW Contact:  Garret Reddish, RN Phone Number: 04/21/2023, 2:46 PM   Clinical Narrative:    Chart reviewed.  Noted that patient has orders for discharge today.  I have spoken with Lauren with [facility].  She informs me that [facility] will have an ICU bed for patient today.  Lauren reports that patient will go to room 417 and the number to call report to is [phone].  The Provider at [facility] will be Dr. Eliezer Champagne and the number for Provider handoff is [phone].    I have informed patient's sister Ben [place] Sink of the above information.    [facility] today.    I have informed staff nurse of the above information.     Final next level of care: Long Term Acute Care (LTAC) ([facility]) Barriers to Discharge: No Barriers Identified   Patient Goals and CMS Choice   CMS Medicare.gov Compare Post Acute Care list provided to:: Patient Represenative (must comment) (Patient's sister [place] Sink) Choice offered to / list presented to : Sibling      Discharge Placement                Patient to be transferred to facility by: [facility] Name of family member notified: Sharon Sink Patient and family notified of of transfer: 04/21/23  Discharge Plan and Services Additional resources added to the After Visit Summary for       Post Acute Care Choice: Long Term Acute Care (LTAC)                               Social Drivers of Health (SDOH) Interventions SDOH Screenings   Food Insecurity: Patient Unable To Answer (04/06/2023)  Housing: Patient Unable To Answer (04/06/2023)  Transportation Needs: Patient Unable To Answer (04/06/2023)  Utilities: Patient Unable To Answer (04/06/2023)  Depression (PHQ2-9): Low Risk  (09/10/2022)  Financial Resource Strain: Low Risk  (11/06/2018)  Physical Activity: Inactive  (11/06/2018)  Social Connections: Patient Unable To Answer (04/06/2023)  Stress: No Stress Concern Present (11/06/2018)  Tobacco Use: High Risk (03/29/2023)     Readmission Risk Interventions     No data to display

## 2023-04-21 NOTE — Progress Notes (Signed)
 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  He presented to [facility] ER via EMS from [facility] following a witnessed cardiac arrest.  Bystander CPR initiated and EMS arrived on the scene within 30 seconds cardiac rhythm asystole.  ROSC initially achieved, however pt in and out of PEA/asystole.    Pt recently evaluated in the ER on 02/4 with urinary urgency and frequency that started 5 days prior to that presentation.  Diagnosed with a UTI and prescribed Bactrim DS twice daily for 10 days.  However, symptoms persisted prompting [facility] visit today.  ED Course  Upon arrival to the ER pt on NRB with pulse.  It was reported during chest compressions the pt began to fight and pulling at medical devices.  While in the the ER waiting room the pt cardiac arrested again requiring ACLS protocol and mechanical intubation.  Pt with varied cardiac rhythm during cardiac arrest PEA/asystole/complete heart block.  He required transcutaneously pacing.  EKG showed apparent sinus rhythm with complete heart block and ventricular escape rhythm with LBBB morphology.  Total downtime estimated 2hrs from out of hospital cardiac arrest and inpatient cardiac arrest.  Pt required epinephrine gtt due to bradycardia and hypotension.  Pt taken emergently to the cardiac cath lab for temporary transvenous pacemaker placement, emergent cardiac catheterization, and swan-ganz catheter placement.   Cardiac cath revealed severe native CAD with patent LIMA-LAD and SVG-OM. SVG-RCA is occluded, but did not appear  acute and unlikely the inciting factor for today's cardiac arrest.  Per cardiology if pt makes a meaningful recovery he may require revascularization of the RCA.  Pt admitted to the ICU post cardiac cath PCCM consulted to asume care.    Hospital course: See significant events for more detail Overnight on 2/20-2/21 patient acutely decompensated with sudden bradycardia- pause and hypoxia into the 60's. Patient emergently intubated and place on mechanical ventilatory support with resolution of baseline rhythm. CT angio post intubation revealed moderate Right Upper & lower PE without heart strain.  Most recent Significant labs: (Labs/ Imaging personally reviewed)  Chemistry: Na+:149, K+: 3.3, BUN/Cr.: 49/ 1.48, Serum CO2/ AG: 21/11 Hematology: WBC: 8.3, Hgb: 9.8,  Troponin: 74, BNP: 498.8, Lactic: 2.8  ABG: 7.38/ 40/175/23.7 CXR 04/15/23: Patchy LLL retrocardiac opacity, suspicious for pneumonia CT head wo contrast 04/15/23: Small right frontal lobe cortical infarcts this month on MRI are occult by CT. No new intracranial abnormality identified. Ct angio chest 04/15/23:  Moderate right-sided arterial clot burden as described above, with occlusive clot in the right upper lobe anterior and posterior segmental arteries, and nonoccluding thrombus in the right lower lobe main artery and superior and posterior basal segmental arteries. 2. No increase in the RV/LV ratio, but there is mild contrast reflux into the IVC, and the right main pulmonary artery is more prominent than previously consistent with arterial congestion. Cardiomegaly with old sternotomy and CABG changes. 4. Small pleural effusions with interstitial and ground-glass edema in the upper lobes. 5. More confluent and dense consolidation in the lower lobes posteriorly, which could be due to consolidated alveolar edema, pneumonia or aspiration.  6. Aortic and coronary artery atherosclerosis. 7. Hepatic steatosis. 8. Cholelithiasis. 9. Nonobstructing  left renal stone. 10. Slightly displaced recent fractures of the left anterior second through sixth and right anterior fourth through seventh ribs, almost certainly CPR related. No pneumothorax. No mediastinal hematoma.  PCCM re-consulted due to emergent acute hypoxic respiratory failure secondary to moderate right upper & lower PE requiring emergent intubation and mechanical ventilatory support.  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  Worsening AKI and mild hyperkalemia, given shifting measures.  Low threshold for Nephrology consultation. 02/13: Weaning down vasopressors, Vasopressin off, Levo @ 7, Epi @ 6.  On minimal vent support.  Renal function and hyperkalemia slowly improving.  Advanced Heart Failure diuresing with 40 mg IV Lasix x1 dose for CVP of 15.  Perform WUA. 02/14: Weaned off vasopressors  On minimal vent support, failed WUA due to        severe hypertension and increased WOB.  On WUA only opening eyes, not        following commands.  MRI Brain revealed 2 small acute infarctions affecting the        cortical brain in theright frontal region.Theone Murdoch, temporary pacer and right        femoral venous sheath removed.  AKI slowly improving.  Heparin gtt discontinued        given low suspicion for PE.  To have PICC placed per Heart Failure team request. 02/15: Pt remains mechanically intubated.  All sedation off  brainstem reflexes intact and opens eyes to voice.  Mentation precluding extubation.  Neurology consulted 02/16: Overnight pt developed worsening acute respiratory failure with accessory muscle use, tachycardia, and hypoxia requiring bagging via BVM, therefore propofol gtt restarted.  Neurology recommending MRA Head/Neck and EEG once stable to transfer.  Performing WUA and possible SBT today  2/17: No significant events overnight, mental status still lagging. stroke work up pending per neuro recs 2/18: No significant events overnight. EEG without seizures, CTH negative, and Neck as below. Neuro following for neuro prognostication 2/19: following commands, extubated 2/20: patient confused and agitated, no SOB on 4 L [place]. Overnight patient became bradycardic with pauses, while maintaining a pulse and hypoxic in the 60's. Emergently intubated and placed back on mechanical ventilatory support with return to normal rhythm. 2/22 severe hypoxia, b/l PE 2/23 severe hypoxia b/l PE 04/18/23-not following verbal communication, on FIO2 40%.  Secretions are moderate, aspirate in process from ETT. Vascular consult today.  For thrombectomy today after review of plan with family.  04/19/23- patietn s/p thrombectomy yesterday, today I met with family at bedside and we reivewed medical plan and answered questions. Were working on SBT today for liberation trial.  He is approved with [facility] for admission, family updated 2/26 remains on vent 04/21/23- plan for trache. Family spoke with CM and wished for [facility].  We will work on accommodating discharge hopefully today.  He is on PRVC 28% with RASS-2.  Family meeting today to review everything and answer any additional  questions.   Interim History / Subjective:  Remains critically ill Remains intubated +hypoxia Requires VENT support for survival Failed weaning trials, sister updated  Vent Mode: PRVC FiO2 (%):  [28 %-30 %] 28 % Set Rate:  [15 bmp] 15 bmp Vt  Set:  [500 mL] 500 mL PEEP:  [5 cmH20] 5 cmH20 Plateau Pressure:  [12 cmH20-14 cmH20] 13 cmH20  Objective   Blood pressure 107/63, pulse 97, temperature 99.9 F (37.7 C), temperature source Axillary, resp. rate 17, height 6' 0.01" (1.829 m), weight 102.6 kg, SpO2 99%.      Intake/Output Summary (Last 24 hours) at 04/21/2023 4696 Last data filed at 04/21/2023 0641 Gross per 24 hour  Intake 3093.62 ml  Output 4610 ml  Net -1516.38 ml   Filed Weights   04/19/23 0400 04/20/23 0500 04/21/23 0500  Weight: 103.5 kg 103.7 kg 102.6 kg      REVIEW OF SYSTEMS  PATIENT IS UNABLE TO PROVIDE COMPLETE REVIEW OF SYSTEMS DUE TO SEVERE CRITICAL ILLNESS   PHYSICAL EXAMINATION:  GENERAL:critically ill appearing, +resp distress EYES: Pupils equal, round, reactive to light.  No scleral icterus.  MOUTH: Moist mucosal membrane. INTUBATED NECK: Supple.  PULMONARY: Lungs clear to auscultation, +rhonchi CARDIOVASCULAR: S1 and S2.  Regular rate and rhythm GASTROINTESTINAL: Soft, nontender, -distended. Positive bowel sounds.  MUSCULOSKELETAL: edema.  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 placement (removed 02/15) Junctional tachycardia  With  Acute on chronic HFrEF with Cardiogenic shock leading to acute severe hypoxic resp failure, s/p extubation, then with acute PE leading to severe hypoxia and needing re-intubation  Severe ACUTE Hypoxic and Hypercapnic Respiratory Failure -continue Mechanical Ventilator support -Wean Fio2 and PEEP as tolerated -VAP/VENT bundle implementation - Wean PEEP & FiO2 as tolerated, maintain SpO2 > 88% - Head of bed elevated 30 degrees, VAP protocol in place - Plateau pressures less than 30 cm H20  - Intermittent chest x-ray & ABG PRN - Ensure adequate pulmonary  hygiene  -will perform SAT/SBT when respiratory parameters are met WUA when family arrives       Moderate Right upper & Lower Pulmonary embolism -NON massive  - Systemic Heparin drip per pharmacy protocol - Follow up BLE dopplers to assess for DVT - Echocardiogram ordered  - consider Vascular consult if IVC filter/ thrombectomy is warranted    S/p thrombectomy - extremely large clot burden removed - appreciate vascular surgery.         NEUROLOGY ACUTE METABOLIC ENCEPHALOPATHY -need for sedation WUA when family arrives MR Brain 02/14: 2 small acute infarctions affecting the cortical brain in the right frontal region.  Mild chronic small-vessel ischemic change of the cerebral hemispheric white matter with a few old small cortical infarctions scattered about the right hemisphere suggesting chronic/recurrent micro embolic infarctions. Recommend carotid evaluation. US carotid showing < 50% stenosis. MRA Head/Neck with small infarcts, and EEG without seizures. 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 SVG-RCA is occluded, but did  not appear acute and unlikely the inciting factor for today's cardiac arrest Echocardiogram 04/07/23:  LVEF 40-45%, mild LVH, Grade I DD, RV systolic function not well visualized, RV size normal - diuresis with lasix as hemodynamics and renal function allow - cardiology following, appreciate input   RENAL -continue Foley Catheter-assess need -Avoid nephrotoxic agents -Follow urine output, BMP -Ensure adequate renal perfusion, optimize oxygenation -Renal dose  medications   Intake/Output Summary (Last 24 hours) at 04/21/2023 1610 Last data filed at 04/21/2023 9604 Gross per 24 hour  Intake 3093.62 ml  Output 4610 ml  Net -1516.38 ml        Latest Ref Rng & Units 04/21/2023    4:03 AM 04/20/2023    3:51 AM 04/19/2023    4:06 AM  BMP  Glucose 70 - 99 mg/dL 540  981  191   BUN 8 - 23 mg/dL 38  35  37   Creatinine 0.61 - 1.24 mg/dL 4.78  2.95  6.21   Sodium 135 - 145 mmol/L 139  139  138   Potassium 3.5 - 5.1 mmol/L 4.2  4.0  4.2   Chloride 98 - 111 mmol/L 111  112  112   CO2 22 - 32 mmol/L 22  23  21    Calcium 8.9 - 10.3 mg/dL 8.4  8.5  8.3      ENDO - ICU hypoglycemic\Hyperglycemia protocol -check FSBS per protocol   GI GI PROPHYLAXIS as indicated NUTRITIONAL STATUS DIET-->TF's as tolerated Constipation protocol as indicated   ELECTROLYTES -follow labs as needed -replace as needed -pharmacy consultation and following  RESTRICTIVE TRANSFUSION PROTOCOL TRANSFUSION  IF HGB<7  or ACTIVE BLEEDING OR DX of ACUTE CORONARY SYNDROMES    Best Practice (right click and "Reselect all SmartList Selections" daily)  Diet/type: NPO DVT prophylaxis LMWH Pressure ulcer(s): N/A GI prophylaxis: H2B Lines: Portacath present on admission and Left Upper Extremity PICC and still needed Foley:  Yes, and it is still needed Code Status:  DNR with pre-interventions Last date of multidisciplinary goals of care discussion [04/14/23]  2/22: family updated about change in clinical status and emergent intubation requiring mechanical ventilator support. Labs   CBC: Recent Labs  Lab 04/17/23 0157 04/18/23 0442 04/18/23 1540 04/19/23 0406 04/20/23 0351 04/21/23 0403  WBC 8.9 6.8  --  5.8 5.2 5.4  NEUTROABS  --   --   --  4.2  --   --   HGB 8.2* 7.8* 7.5* 8.7* 8.7* 8.9*  HCT 25.9* 24.0* 23.7* 26.7* 26.6* 27.3*  MCV 103.6* 101.3*  --  98.5 98.9 97.5  PLT 177 191  --  214 215 224    Basic Metabolic Panel: Recent Labs  Lab 04/15/23 0337  04/15/23 0350 04/16/23 0439 04/17/23 0157 04/18/23 0442 04/19/23 0406 04/20/23 0351 04/21/23 0403  NA  --    < > 144 143 142 138 139 139  K  --    < > 3.6 3.5 3.9 4.2 4.0 4.2  CL  --    < > 114* 113* 112* 112* 112* 111  CO2  --    < > 22 21* 21* 21* 23 22  GLUCOSE  --    < > 99 97 92 103* 108* 112*  BUN  --    < > 41* 43* 43* 37* 35* 38*  CREATININE  --    < > 1.37* 1.39* 1.23 1.14 1.08 1.05  CALCIUM  --    < > 8.3* 8.3* 8.3* 8.3* 8.5* 8.4*  MG 2.4  --  2.2  --   --  1.9 2.0 1.9  PHOS  --    < > 4.0 3.6  --  3.3 3.5 3.6   < > = values in this interval not displayed.   GFR: Estimated Creatinine Clearance: 74.2 mL/min (by C-G formula based on SCr of 1.05 mg/dL). Recent Labs  Lab 04/15/23 [phone] 04/15/23 9563 04/15/23 8756 04/16/23 0439 04/18/23 0442 04/19/23 0406 04/20/23 0351 04/21/23 0403  PROCALCITON  --   --  0.22  --   --   --   --   --   WBC 8.3  --   --    < > 6.8 5.8 5.2 5.4  LATICACIDVEN 2.8* 1.1  --   --   --   --   --   --    < > = values in this interval not displayed.    Liver Function Tests: Recent Labs  Lab 04/15/23 0350 04/16/23 0439 04/17/23 0157  ALBUMIN 2.6* 2.5* 2.4*   No results for input(s): "LIPASE", "AMYLASE" in the last 168 hours. No results for input(s): "AMMONIA" in the last 168 hours.     DVT/GI PRX  assessed I Assessed the need for Labs I Assessed the need for Foley I Assessed the need for Central Venous Line Family Discussion when available I Assessed the need for Mobilization I made an Assessment of medications to be adjusted accordingly Safety Risk assessment completed  CASE DISCUSSED IN MULTIDISCIPLINARY ROUNDS WITH ICU TEAM    Critical care provider statement:   Total critical care time: 33 minutes   Performed by: Karna Christmas MD   Critical care time was exclusive of separately billable procedures and treating other patients.   Critical care was necessary to treat or prevent imminent or life-threatening  deterioration.   Critical care was time spent personally by me on the following activities: development of treatment plan with patient and/or surrogate as well as nursing, discussions with consultants, evaluation of patient's response to treatment, examination of patient, obtaining history from patient or surrogate, ordering and performing treatments and interventions, ordering and review of laboratory studies, ordering and review of radiographic studies, pulse oximetry and re-evaluation of patient's condition.    Vida Rigger, M.D.  Pulmonary & Critical Care Medicine

## 2023-04-21 NOTE — Discharge Summary (Signed)
 Physician Discharge Summary  Patient ID: Cory Hess MRN: [id number] DOB/AGE: 1945/10/07 78 y.o.  Admit date: 04/05/2023 Discharge date: 04/21/2023   Brief Pt Description / Synopsis:  Cory Hess is a  78 year old male patient with a past medical history significant for coronary artery disease status post CABG on 7/19, alternating bundle branch block, hyperlipidemia, heart failure with improved EF head and neck cancer on active chemotherapy presenting to [facility] on 04/05/23 with out-of-hospital cardiac arrest. Downtime for 2 hours. Taken emergently to the Cath Lab with findings of significant coronary artery disease however not of which appeared acute. Therefore no PCI was done. He was transferred to the ICU for ongoing medical care. Found to have Acute Decompensated HFrEF, shock (cardiogenic + septic), Acute Hypoxic & Hypercapnic Respiratory Failure requiring intubation and mechanical ventilation, but was successfully extubated.  Course complicated by development of Pseudomonas Pneumonia and Pulmonary Embolism requiring reintubation and Thrombectomy with Vascular Surgery. With failure to wean from ventilator requiring transfer to [facility], likely needing tracheostomy.  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  He presented to [facility]  ER via EMS from [facility] following a witnessed cardiac arrest.  Bystander CPR initiated and EMS arrived on the scene within 30 seconds cardiac rhythm asystole.  ROSC initially achieved, however pt in and out of PEA/asystole.     Pt recently evaluated in the ER on 02/4 with urinary urgency and frequency that started 5 days prior to that presentation.  Diagnosed with a UTI and prescribed Bactrim DS twice daily for 10 days.  However, symptoms persisted prompting [facility] visit today.   ED Course  Upon arrival to the ER pt on NRB with pulse.  It was reported during chest compressions the pt began to fight and pulling at medical devices.  While in the the ER waiting room the pt cardiac arrested again requiring ACLS protocol and mechanical intubation.  Pt with varied cardiac rhythm during cardiac arrest PEA/asystole/complete heart block.  He required transcutaneously pacing.  EKG showed apparent sinus rhythm with complete heart block and ventricular escape rhythm with LBBB morphology.  Total downtime estimated 2hrs from out of hospital cardiac arrest and inpatient cardiac arrest.  Pt required epinephrine gtt due to bradycardia and hypotension.  Pt taken emergently to the cardiac cath lab for temporary transvenous pacemaker placement, emergent cardiac catheterization, and swan-ganz catheter placement.   Cardiac cath revealed severe native CAD with patent LIMA-LAD and SVG-OM. SVG-RCA is occluded, but did not appear acute and unlikely the inciting factor for today's cardiac arrest.  Per cardiology if pt makes a meaningful recovery he may require revascularization of the RCA.  Pt  admitted to the ICU post cardiac cath PCCM consulted to asume care.   Please see "Significant Hospital Events" section below for full detailed hospital course.   Discharge Plan by Diagnosis:   #Cardiac Arrest #Complete Heart Block s/p Temporary Transvenous Pacemaker placement (removed 2/15) #Junctional  Tachycardia #Acute Decompensated HFrEF #Shock: Cardiogenic + Septic ~ RESOLVED Cardiac Cath 02/11: severe native CAD with patent LIMA-LAD and SVG-OM. SVG-RCA is occluded, but did not appear acute and unlikely the inciting factor for today's cardiac arrest Echocardiogram 04/07/23:  LVEF 40-45%, mild LVH, Grade I DD, RV systolic function not well visualized, RV size normal -Continuous cardiac monitoring -Maintain MAP >65 -Vasopressors as needed to maintain MAP goal ~ weaned off -Cardiology and Advanced Heart Failure following, appreciate input -Diuresis as BP and renal function permits -Continue ASA 81mg  daily + plavix 75mg  daily x21 days f/b ASA 81mg  daily monotherapy after that  -Continue atorvastatin 80mg  daily   #Acute Hypoxic & Hypercapnic Respiratory Failure in the setting of .... #Acute Decompensated HFrEF #Pseudomonas Pneumonia #Pulmonary Embolism -Full vent support, implement lung protective strategies -Plateau pressures less than 30 cm H20 -Wean FiO2 & PEEP as tolerated to maintain O2 sats >92% -Follow intermittent Chest X-ray & ABG as needed -Spontaneous Breathing Trials when respiratory parameters met and mental status permits -Implement VAP Bundle -Bronchodilators -Diuresis as BP and renal function permits -ABX as above -Continue Heparin gtt ~ plan to transition to treatment dose Lovenox BID -Status post Thrombectomy with Vascular surgery  #Pseudomonas Pneumonia -Monitor fever curve -Trend WBC's  -Follow cultures as above -Continue empiric Ciprofloxacin pending cultures & sensitivities  #Acute Kidney Injury ~ RESOLVED -Monitor I&O's / urinary output -Follow BMP -Ensure adequate renal perfusion -Avoid nephrotoxic agents as able -Replace electrolytes as indicated ~ Pharmacy following for assistance with electrolyte replacement  #Acute Metabolic Encephalopathy #Acute CVA #Sedation needs in setting of mechanical ventilation MR Brain 02/14: 2 small acute infarctions  affecting the cortical brain in the right frontal region.  Mild chronic small-vessel ischemic change of the cerebral hemispheric white matter with a few old small cortical infarctions scattered about the right hemisphere suggesting chronic/recurrent micro embolic infarctions. Recommend carotid evaluation. US carotid showing < 50% stenosis. MRA Head/Neck with small infarcts, and EEG without seizures. Repeat CTH no new acute intracranial abnormality -Maintain a RASS goal of 0 to -1 -Fentanyl and Propofol as needed to maintain RASS goal -Avoid sedating medications as able -Daily wake up assessment -Neurology following, appreciate input -Continue ASA 81mg  daily + plavix 75mg  daily x21 days f/b ASA 81mg  daily monotherapy after that  -Continue atorvastatin 80mg  daily     Significant Events:  02/11: Pt admitted mechanically intubated post cardiac arrest s/p cardiac catheterization with swan-gaze catheter and emergent transvenous catheter placement 02/12: Critically ill on 3 Vasopressors, give 250 cc bolus fluid challenge.  Worsening AKI and mild hyperkalemia, given shifting measures.  Low threshold for Nephrology consultation. 02/13: Weaning down vasopressors, Vasopressin off, Levo @ 7, Epi @ 6.  On minimal vent support.  Renal function and hyperkalemia slowly improving.  Advanced Heart Failure diuresing with 40 mg IV Lasix x1 dose for CVP of 15.  Perform WUA. 02/14: Weaned off vasopressors  On minimal vent support, failed WUA due to        severe hypertension and increased WOB.  On WUA only opening eyes, not        following commands.  MRI Brain revealed 2 small acute infarctions affecting the        cortical brain  in theright frontal region.Cory Hess, temporary pacer and right        femoral venous sheath removed.  AKI slowly improving.  Heparin gtt discontinued        given low suspicion for PE.  To have PICC placed per Heart Failure team request. 02/15: Pt remains mechanically intubated.  All  sedation off brainstem reflexes intact and opens eyes to voice.  Mentation precluding extubation.  Neurology consulted 02/16: Overnight pt developed worsening acute respiratory failure with accessory muscle use, tachycardia, and hypoxia requiring bagging via BVM, therefore propofol gtt restarted.  Neurology recommending MRA Head/Neck and EEG once stable to transfer.  Performing WUA and possible SBT today  2/17: No significant events overnight, mental status still lagging. stroke work up pending per neuro recs 2/18: No significant events overnight. EEG without seizures, CTH negative, and Neck as below. Neuro following for neuro prognostication 2/19: following commands, extubated 2/20: patient confused and agitated, no SOB on 4 L [place]. Overnight patient became bradycardic with pauses, while maintaining a pulse and hypoxic in the 60's. Emergently intubated and placed back on mechanical ventilatory support with return to normal rhythm. 2/22 severe hypoxia, b/l PE 2/23 severe hypoxia b/l PE 04/18/23-not following verbal communication, on FIO2 40%.  Secretions are moderate, aspirate in process from ETT. Vascular consult today.  For thrombectomy today after review of plan with family.  04/19/23- patietn s/p thrombectomy yesterday, today I met with family at bedside and we reivewed medical plan and answered questions. Were working on SBT today for liberation trial.  He is approved with [facility] for admission, family updated 2/26 remains on vent, failed multiple weaning trials 2/27: Remains on Vent.  To [facility]                Significant Diagnostic Studies:  Ultrasound of BLE 2/12: IMPRESSION: 1. No evidence of deep venous thrombosis within either lower extremity. MRI Brain 2/15: IMPRESSION: 1. 2 small acute infarctions affecting the cortical brain in the right frontal region. 2. Mild chronic small-vessel ischemic change of the cerebral hemispheric white matter with a few old small  cortical infarctions scattered about the right hemisphere suggesting chronic/recurrent micro embolic infarctions. Recommend carotid evaluation CXR 04/15/23: Patchy LLL retrocardiac opacity, suspicious for pneumonia CT head wo contrast 04/15/23: Small right frontal lobe cortical infarcts this month on MRI are occult by CT. No new intracranial abnormality identified. Ct angio chest 04/15/23:  Moderate right-sided arterial clot burden as described above, with occlusive clot in the right upper lobe anterior and posterior segmental arteries, and nonoccluding thrombus in the right lower lobe main artery and superior and posterior basal segmental arteries. 2. No increase in the RV/LV ratio, but there is mild contrast reflux into the IVC, and the right main pulmonary artery is more prominent than previously consistent with arterial congestion. Cardiomegaly with old sternotomy and CABG changes. 4. Small pleural effusions with interstitial and ground-glass edema in the upper lobes. 5. More confluent and dense consolidation in the lower lobes posteriorly, which could be due to consolidated alveolar edema, pneumonia or aspiration. 6. Aortic and coronary artery atherosclerosis. 7. Hepatic steatosis. 8. Cholelithiasis. 9. Nonobstructing left renal stone. 10. Slightly displaced recent fractures of the left anterior second through sixth and right anterior fourth through seventh ribs, almost certainly CPR related. No pneumothorax. No mediastinal hematoma.            Micro Data:  2/11: COVID-19 PCR>>Negative 2/11: Urine>> no growth 2/11: MRSA PCR>> negative 2/12: Blood culture  x 2>> no growth 2/17: Tracheal aspirate>> Pseudomonas aeruginosa 2/21: Tracheal aspirate >>Pseudomonas aeruginosa 2/23: Tracheal aspirate>> Pseudomonas aeruginosa   Antimicrobials:   Anti-infectives (From admission, onward)    Start     Dose/Rate Route Frequency Ordered Stop   04/21/23 0000  ciprofloxacin (CIPRO) 400 MG/200ML SOLN         400 mg Intravenous Every 8 hours 04/21/23 1048     04/19/23 1430  ciprofloxacin (CIPRO) IVPB 400 mg        400 mg 200 mL/hr over 60 Minutes Intravenous Every 8 hours 04/19/23 1336 04/26/23 1359   04/18/23 1432  ceFAZolin (ANCEF) IVPB 2g/100 mL premix        2 g 200 mL/hr over 30 Minutes Intravenous 30 min pre-op 04/18/23 1432 04/18/23 1523   04/15/23 0800  Ampicillin-Sulbactam (UNASYN) 3 g in sodium chloride 0.9 % 100 mL IVPB  Status:  Discontinued        3 g 200 mL/hr over 30 Minutes Intravenous Every 6 hours 04/15/23 0632 04/15/23 1033   04/11/23 0700  piperacillin-tazobactam (ZOSYN) IVPB 3.375 g        3.375 g 12.5 mL/hr over 240 Minutes Intravenous  Once 04/11/23 0605 04/11/23 1030   04/06/23 1100  vancomycin (VANCOREADY) IVPB 2000 mg/400 mL        2,000 mg 200 mL/hr over 120 Minutes Intravenous  Once 04/06/23 1006 04/06/23 1253   04/06/23 1100  piperacillin-tazobactam (ZOSYN) IVPB 3.375 g  Status:  Discontinued        3.375 g 12.5 mL/hr over 240 Minutes Intravenous Every 8 hours 04/06/23 1006 04/11/23 0605   04/05/23 1830  cefTRIAXone (ROCEPHIN) 2 g in sodium chloride 0.9 % 100 mL IVPB  Status:  Discontinued        2 g 200 mL/hr over 30 Minutes Intravenous Every 24 hours 04/05/23 1736 04/06/23 0827        Consults:  Cardiology Advanced heart failure Vascular surgery Neurology Palliative care PCCM   Discharge Exam:   General: Acutely ill-appearing male, laying in bed, intubated sedated, no acute distress Neuro: Sedated, withdraws from pain, no focal deficits noted, pupils PERRLA CV: Regular rate and rhythm, S1-S2, no murmurs, rubs, gallops PULM: Mechanical breath sounds throughout, even, nonlabored, synchronous with the vent GI: Soft, nontender, nondistended, no guarding rebound tenderness, bowel sounds positive x 4 Extremities: Normal bulk and tone, no deformities, 1+ edema bilateral lower extremities  Vitals:   04/21/23 0545 04/21/23 0600 04/21/23 0700 04/21/23  0800  BP:  107/63 115/62 (!) 142/67  Pulse: (!) 101 97 94 96  Resp: (!) 22 17 19 19   Temp:    100 F (37.8 C)  TempSrc:    Axillary  SpO2: 100% 99% 98% 97%  Weight:      Height:         Discharge Labs:   BMET Recent Labs  Lab 04/15/23 0337 04/15/23 0350 04/16/23 0439 04/17/23 0157 04/18/23 0442 04/19/23 0406 04/20/23 0351 04/21/23 0403  NA  --    < > 144 143 142 138 139 139  K  --    < > 3.6 3.5 3.9 4.2 4.0 4.2  CL  --    < > 114* 113* 112* 112* 112* 111  CO2  --    < > 22 21* 21* 21* 23 22  GLUCOSE  --    < > 99 97 92 103* 108* 112*  BUN  --    < > 41* 43* 43* 37* 35* 38*  CREATININE  --    < > 1.37* 1.39* 1.23 1.14 1.08 1.05  CALCIUM  --    < > 8.3* 8.3* 8.3* 8.3* 8.5* 8.4*  MG 2.4  --  2.2  --   --  1.9 2.0 1.9  PHOS  --    < > 4.0 3.6  --  3.3 3.5 3.6   < > = values in this interval not displayed.    CBC Recent Labs  Lab 04/19/23 0406 04/20/23 0351 04/21/23 0403  HGB 8.7* 8.7* 8.9*  HCT 26.7* 26.6* 27.3*  WBC 5.8 5.2 5.4  PLT 214 215 224    Anti-Coagulation No results for input(s): "INR" in the last 168 hours.        Allergies as of 04/21/2023   No Known Allergies      Medication List     STOP taking these medications    acetaminophen 650 MG CR tablet Commonly known as: TYLENOL Replaced by: acetaminophen 325 MG tablet   aspirin EC 81 MG tablet Replaced by: aspirin 81 MG chewable tablet   carvedilol 12.5 MG tablet Commonly known as: COREG   lidocaine-prilocaine cream Commonly known as: EMLA   losartan 100 MG tablet Commonly known as: COZAAR   multivitamin with minerals Tabs tablet   nitroGLYCERIN 0.4 MG SL tablet Commonly known as: NITROSTAT   OLANZapine 5 MG tablet Commonly known as: ZYPREXA   ondansetron 8 MG tablet Commonly known as: ZOFRAN Replaced by: ondansetron 4 MG/2ML Soln injection   prochlorperazine 10 MG tablet Commonly known as: COMPAZINE   senna-docusate 8.6-50 MG tablet Commonly known as:  Senokot-S   torsemide 20 MG tablet Commonly known as: DEMADEX       TAKE these medications    acetaminophen 325 MG tablet Commonly known as: TYLENOL Place 2 tablets (650 mg total) into feeding tube every 4 (four) hours as needed for headache or mild pain (pain score 1-3). Replaces: acetaminophen 650 MG CR tablet   aspirin 81 MG chewable tablet Place 1 tablet (81 mg total) into feeding tube daily. Start taking on: April 22, 2023 Replaces: aspirin EC 81 MG tablet   atorvastatin 80 MG tablet Commonly known as: LIPITOR Place 1 tablet (80 mg total) into feeding tube daily. Start taking on: April 22, 2023 What changed: how to take this   Chlorhexidine Gluconate Cloth 2 % Pads Apply 6 each topically at bedtime.   ciprofloxacin 400 MG/200ML Soln Commonly known as: CIPRO Inject 200 mLs (400 mg total) into the vein every 8 (eight) hours.   clopidogrel 75 MG tablet Commonly known as: PLAVIX Place 1 tablet (75 mg total) into feeding tube daily. Start taking on: April 22, 2023   docusate 50 MG/5ML liquid Commonly known as: COLACE Place 10 mLs (100 mg total) into feeding tube 2 (two) times daily as needed for moderate constipation.   docusate 50 MG/5ML liquid Commonly known as: COLACE Place 10 mLs (100 mg total) into feeding tube 2 (two) times daily.   enoxaparin 100 MG/ML injection Commonly known as: LOVENOX Inject 1 mL (100 mg total) into the skin 2 (two) times daily.   famotidine 20 MG tablet Commonly known as: PEPCID Place 1 tablet (20 mg total) into feeding tube daily. Start taking on: April 22, 2023   feeding supplement (VITAL HIGH PROTEIN) Liqd liquid Place 1,000 mLs into feeding tube continuous.   fentaNYL 10 mcg/ml Soln infusion Inject 25-200 mcg/hr into the vein continuous.   fentaNYL Soln Commonly known as: SUBLIMAZE Inject  25-100 mcg into the vein every 15 (fifteen) minutes as needed (to maintain RASS & CPOT goal.).   free water Soln Place  200 mLs into feeding tube every 4 (four) hours.   haloperidol lactate 5 MG/ML injection Commonly known as: HALDOL Inject 0.4 mLs (2 mg total) into the vein every 6 (six) hours as needed.   hydrALAZINE 20 MG/ML injection Commonly known as: APRESOLINE Inject 0.5 mLs (10 mg total) into the vein every 2 (two) hours as needed (sbp GREATER THAN 180 mmHg).   insulin aspart 100 UNIT/ML injection Commonly known as: novoLOG Inject 0-6 Units into the skin every 4 (four) hours.   ipratropium-albuterol 0.5-2.5 (3) MG/3ML Soln Commonly known as: DUONEB Take 3 mLs by nebulization every 6 (six) hours as needed.   lidocaine 5 % Commonly known as: LIDODERM Place 1 patch onto the skin daily. Remove & Discard patch within 12 hours or as directed by MD   midazolam 2 MG/2ML Soln injection Commonly known as: VERSED Inject 1-2 mLs (1-2 mg total) into the vein every hour as needed for sedation (to maintain RASS goal.).   mouth rinse Liqd solution 15 mLs by Mouth Rinse route every 2 (two) hours.   ondansetron 4 MG/2ML Soln injection Commonly known as: ZOFRAN Inject 2 mLs (4 mg total) into the vein every 6 (six) hours as needed for nausea. Replaces: ondansetron 8 MG tablet   polyethylene glycol 17 g packet Commonly known as: MIRALAX / GLYCOLAX Place 17 g into feeding tube daily as needed for mild constipation. What changed:  how to take this when to take this reasons to take this   polyethylene glycol 17 g packet Commonly known as: MIRALAX / GLYCOLAX Place 17 g into feeding tube daily. Start taking on: April 22, 2023 What changed: You were already taking a medication with the same name, and this prescription was added. Make sure you understand how and when to take each.   propofol 1000 MG/100ML Emul injection Commonly known as: DIPRIVAN Inject 513-5,130 mcg/min into the vein continuous.   sodium chloride flush 0.9 % Soln Commonly known as: NS 10-40 mLs by Intracatheter route every 12  (twelve) hours.   sodium chloride flush 0.9 % Soln Commonly known as: NS 10-40 mLs by Intracatheter route as needed (flush).   sodium chloride flush 0.9 % Soln Commonly known as: NS 10-40 mLs by Intracatheter route every 12 (twelve) hours.   sodium chloride flush 0.9 % Soln Commonly known as: NS 10-40 mLs by Intracatheter route as needed (flush).            Disposition: LTACH  Discharged Condition: Cory Hess has met maximum benefit of inpatient care and requires long term acute care for prolonged ventilator weaning.  Time spent on disposition:  45 Minutes.     Signed: Harlon Ditty, AGACNP-BC [facility] Prefer epic messenger for cross cover needs If after hours, please call E-link

## 2023-04-21 NOTE — TOC Progression Note (Incomplete)
Transition of Care [facility]) - Progression Note    Patient Details  Name: Cory Hess MRN: [id number] Date of Birth: 12/11/1945  [facility] CM/SW Contact  Garret Reddish, RN Phone Number: 04/21/2023, 12:11 PM  Clinical Narrative:    Chart reviewed.  I have spoken with [place] patient's sister this morning.  [place] S[place] informs me that she has chosen [facility].  I have made her aware that discharge could possibly be today as [facility] has an available ICU bed.  Provider will meet with family and patient may be a tentative discharge for today.     I have been informed by Lauren with [facility] that patient could be a potential dc today.  Lauren informs me that patient can go to room 417 and the number for the nurse to call report is [phone].  The physician at [facility] name is Eliezer Champagne and his contact number is [phone].    TOC will continue to follow for discharge planning.     Expected Discharge Plan: Long Term Acute Care (LTAC) Barriers to Discharge: Continued Medical Work up  Expected Discharge Plan and Services     Post Acute Care Choice: Long Term Acute Care (LTAC) Living arrangements for the past 2 months: Single Family Home                                       Social Determinants of Health (SDOH) Interventions SDOH Screenings   Food Insecurity: Patient Unable To Answer (04/06/2023)  Housing: Patient Unable To Answer (04/06/2023)  Transportation Needs: Patient Unable To Answer (04/06/2023)  Utilities: Patient Unable To Answer (04/06/2023)  Depression (PHQ2-9): Low Risk  (09/10/2022)  Financial Resource Strain: Low Risk  (11/06/2018)  Physical Activity: Inactive (11/06/2018)  Social Connections: Patient Unable To Answer (04/06/2023)  Stress: No Stress Concern Present (11/06/2018)  Tobacco Use: High Risk (03/29/2023)    Readmission Risk Interventions     No data to display

## 2023-04-21 NOTE — Progress Notes (Signed)
 PHARMACY CONSULT NOTE - FOLLOW UP  Pharmacy Consult for Electrolyte Monitoring and Replacement   Recent Labs: Potassium (mmol/L)  Date Value  04/21/2023 4.2   Magnesium (mg/dL)  Date Value  16/11/9602 1.9   Calcium (mg/dL)  Date Value  54/10/8117 8.4 (L)   Albumin (g/dL)  Date Value  14/78/2956 2.4 (L)  04/01/2021 4.2   Phosphorus (mg/dL)  Date Value  21/30/8657 3.6   Sodium (mmol/L)  Date Value  04/21/2023 139  10/05/2022 141   Assessment: DC is a 78 yo male who presented to the ED post cardiac arrest and was found with "moderate right-sided arterial clot burden". They were treated on the out patient with Bactrim x 10 days and were also found with hyperkalemia. Pharmacy was consulted to manage this patient's electrolytes while in the ICU.   Goal of Therapy:  Electrolytes WNL  Plan:  -- No replacement required today -- Continue to monitor with AM labs   Effie Shy, PharmD Pharmacy Resident  04/21/2023 7:57 AM

## 2023-04-21 NOTE — Progress Notes (Signed)
 ANTICOAGULATION CONSULT NOTE  Pharmacy Consult for heparin infusion Indication: pulmonary embolus  Patient Measurements: Height: 6' 0.01" (182.9 cm) Weight: 103.7 kg (228 lb 9.9 oz) IBW/kg (Calculated) : 77.62 Heparin Dosing Weight: 99.4 kg  Labs: Recent Labs    04/19/23 0406 04/19/23 0813 04/20/23 0351 04/20/23 1416 04/20/23 2233 04/21/23 0403  HGB 8.7*  --  8.7*  --   --  8.9*  HCT 26.7*  --  26.6*  --   --  27.3*  PLT 214  --  215  --   --  224  HEPARINUNFRC  --    < > 0.27* 0.48 0.43 0.41  CREATININE 1.14  --  1.08  --   --  1.05   < > = values in this interval not displayed.   Estimated Creatinine Clearance: 74.5 mL/min (by C-G formula based on SCr of 1.05 mg/dL).  Medical History: Past Medical History:  Diagnosis Date   Alternating RBBB & LBBB    Ankle swelling 2024   Arthritis    neck   Bladder tumor    CAD (coronary artery disease)    a. 08/2017 STEMI/Cath: LM 80ost/d, LAD 100p, LCX 95 ost/p, RCA 60p, 32m, RPDA 80ost; b. 08/2017 CABG x 3: LIMA->LAD, VG->OM, VG->PDA.   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 08/2017 TEE: EF 30-35%, sept/ant/inf HK, apical AK. 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 12/2017 Echo: EF 45-50%, diff HK. Gr1 DD. Mildly reduced RV fxn. Mild RAE; c. 09/2022 Echo: EF 55-60%, mod LVH, GrI DD, nl RV fxn, mildly dil RA, mild MR.   Hyperlipidemia LDL goal <70    Ischemic cardiomyopathy    a. 08/2017 TEE: EF 30-35%; b. 12/2017 Echo: EF 45-50%; c. 09/2022 Echo: EF 55-60%.   Myocardial infarction (HCC) 08/27/2017   Shingles    2018 - right side   Vertigo    several months ago   Assessment: Pt is a 78 yo male presenting to admitted 2/11 following cardiac arrested now found with "moderate right-sided arterial clot burden... with occlusive clot in the right upper lobe anterior and posterior segmental arteries, and nonoccluding thrombus in  the right lower lobe main artery and superior and posterior basal segmental arteries." Pharmacy was consulted to monitor and dose this patient's heparin.   Date/Time HL Rate  Comment 2/21 1412 0.16 900 units/hr Subtherapeutic 2/22 0106 0.32 1200 un/hr Therapeutic x 1 2/22 0826 0.26 1200 units/hr Subtherapeutic 2/22 1733 0.41 1400 units/hr Therapeutic x 1 2/23 0157 0.44 1400 un/hr Therapeutic x 2 2/24 0442 0.29 1400 un/hr Subtherapeutic 2/25 0010        0.37    1600 un/hr      Therapeutic x 1 2/25 0813 0.32 1600 units/hr Therapeutic x 2  2/26 0351 0.27     1600 units/hr   SUBtherapeutic 2/26 1416 0.48 1800 units/hr, Therapeutic x 1 2/26 2233       0.43     1800 units/hr, Therapeutic X 2  2/27 0403        0.41    1800 units/hr, Therapeutic X 3   Goal of Therapy:  Heparin level 0.3-0.7 units/ml Monitor platelets by anticoagulation protocol: Yes   Plan:  --Heparin level is therapeutic x 3 --Continue heparin infusion at 1800 units/hr --Re-check HL on 2/28 with AM labs.  --Daily CBC per protocol while  on IV heparin  Rilee Wendling D 04/21/2023 4:39 AM

## 2023-04-21 NOTE — Progress Notes (Signed)
  Progress Note    04/21/2023 10:24 AM 3 Days Post-Op  Subjective:  Aurelius Gildersleeve is a 78 yo male now POD #3 from Pulmonary Thrombectomy. Patient remains intubated and sedated.  ICU team covering has determined the patient will not have a meaningful recovery therefore his CODE STATUS was changed to DNR/DNI today.  Term plan is transfer to [facility].   Vitals:   04/21/23 0700 04/21/23 0800  BP: 115/62 (!) 142/67  Pulse: 94 96  Resp: 19 19  Temp:  100 F (37.8 C)  SpO2: 98% 97%   Physical Exam: Cardiac:  RRR, normal S1 and S2 Lungs: Lungs clear to auscultation with some scattered rhonchi and scattered wheezing. Incisions: Right groin access with dressing clean dry and intact.  No hematoma seroma to note. Extremities: Palpable pulses throughout.  All extremities warm to touch. Abdomen: Positive bowel sounds throughout, soft, nontender nondistended.  NG tube with tube feeds running. Neurologic: Patient sedated and ventilated.  CBC    Component Value Date/Time   WBC 5.4 04/21/2023 0403   RBC 2.80 (L) 04/21/2023 0403   HGB 8.9 (L) 04/21/2023 0403   HGB 10.3 (L) 01/14/2023 0927   HGB 14.6 04/01/2021 0933   HCT 27.3 (L) 04/21/2023 0403   HCT 43.4 04/01/2021 0933   PLT 224 04/21/2023 0403   PLT 115 (L) 01/14/2023 0927   PLT 151 04/01/2021 0933   MCV 97.5 04/21/2023 0403   MCV 92 04/01/2021 0933   MCH 31.8 04/21/2023 0403   MCHC 32.6 04/21/2023 0403   RDW 14.3 04/21/2023 0403   RDW 12.3 04/01/2021 0933   LYMPHSABS 0.7 04/19/2023 0406   MONOABS 0.6 04/19/2023 0406   EOSABS 0.2 04/19/2023 0406   BASOSABS 0.0 04/19/2023 0406    BMET    Component Value Date/Time   NA 139 04/21/2023 0403   NA 141 10/05/2022 1629   K 4.2 04/21/2023 0403   CL 111 04/21/2023 0403   CO2 22 04/21/2023 0403   GLUCOSE 112 (H) 04/21/2023 0403   BUN 38 (H) 04/21/2023 0403   BUN 11 10/05/2022 1629   CREATININE 1.05 04/21/2023 0403   CREATININE 1.56 (H) 01/14/2023 0927   CALCIUM 8.4 (L) 04/21/2023  0403   GFRNONAA >60 04/21/2023 0403   GFRNONAA 46 (L) 01/14/2023 0927   GFRAA >60 12/21/2018 0937    INR    Component Value Date/Time   INR 1.4 (H) 04/05/2023 1703     Intake/Output Summary (Last 24 hours) at 04/21/2023 1024 Last data filed at 04/21/2023 0846 Gross per 24 hour  Intake 7349.44 ml  Output 4685 ml  Net 2664.44 ml     Assessment/Plan:  78 y.o. male is s/p pulmonary thrombectomy.  3 Days Post-Op   PLAN Per vascular surgery okay to stop heparin infusion and started on oral anticoagulation once patient is medically stable by the ICU team.  Vascular surgery recommends Eliquis 5 mg twice daily. Currently the patient is on aspirin 81 mg daily and Plavix 75 mg daily due to his cardiac status postcatheterization.  Vascular surgery recommends either aspirin with Eliquis or Plavix with Eliquis but not both at this time.  Follow-up with cardiology as to which they prefer. Patient to be discharged to [facility]. Vascular surgery to sign off this case at this time.   DVT prophylaxis: Heparin infusion, ASA 81 mg daily, Plavix 75 mg daily   Marcie Bal [facility] 04/21/2023 10:24 AM

## 2023-04-21 NOTE — Progress Notes (Signed)
 Patient with stable vitals on ventilator support. Transported with [facility]. Family updated at bedside. No belongings left in room.

## 2023-04-21 NOTE — IPAL (Signed)
 GOALS OF CARE FAMILY CONFERENCE  Met with POA Barbee Cough sister of patient   Current clinical status, hospital findings and medical plan was reviewed with family.   Updated and notified of patients ongoing immediate critical medical problems.   Patient remains with [facility] for [facility]   Patient is unable to breathe independently, unable to protect airway and unable to mobilize secretions.    Explained to family course of therapy and the modalities   Patient with Progressive multiorgan failure with high probability of a very minimal chance of meaningful recovery despite aggressive and optimal medical therapy.   Family is appreciative of care and relate understanding that patient is severely critically ill with anticipation of passing away during this hospitalization.    They have consented and agreed to DNR/DNI Code status   Family are satisfied with Plan of action and management. All questions answered  Additional Critical Care time 35 mins    Vida Rigger, M.D.  Pulmonary & Critical Care Medicine  [facility]

## 2023-04-22 DIAGNOSIS — E042 Nontoxic multinodular goiter: Secondary | ICD-10-CM | POA: Diagnosis not present

## 2023-04-22 DIAGNOSIS — G8911 Acute pain due to trauma: Secondary | ICD-10-CM | POA: Diagnosis not present

## 2023-04-22 DIAGNOSIS — I2510 Atherosclerotic heart disease of native coronary artery without angina pectoris: Secondary | ICD-10-CM | POA: Diagnosis not present

## 2023-04-22 DIAGNOSIS — G8929 Other chronic pain: Secondary | ICD-10-CM | POA: Diagnosis not present

## 2023-04-22 DIAGNOSIS — R1311 Dysphagia, oral phase: Secondary | ICD-10-CM | POA: Diagnosis not present

## 2023-04-22 DIAGNOSIS — I10 Essential (primary) hypertension: Secondary | ICD-10-CM | POA: Diagnosis not present

## 2023-04-22 DIAGNOSIS — I2699 Other pulmonary embolism without acute cor pulmonale: Secondary | ICD-10-CM | POA: Diagnosis not present

## 2023-04-22 DIAGNOSIS — R5381 Other malaise: Secondary | ICD-10-CM | POA: Diagnosis not present

## 2023-04-22 DIAGNOSIS — R1310 Dysphagia, unspecified: Secondary | ICD-10-CM | POA: Diagnosis not present

## 2023-04-22 DIAGNOSIS — J9611 Chronic respiratory failure with hypoxia: Secondary | ICD-10-CM | POA: Diagnosis not present

## 2023-04-22 DIAGNOSIS — I469 Cardiac arrest, cause unspecified: Secondary | ICD-10-CM | POA: Diagnosis not present

## 2023-04-22 DIAGNOSIS — I639 Cerebral infarction, unspecified: Secondary | ICD-10-CM | POA: Diagnosis not present

## 2023-04-22 DIAGNOSIS — J9601 Acute respiratory failure with hypoxia: Secondary | ICD-10-CM | POA: Diagnosis not present

## 2023-04-22 DIAGNOSIS — Z9911 Dependence on respirator [ventilator] status: Secondary | ICD-10-CM | POA: Diagnosis not present

## 2023-04-22 DIAGNOSIS — G893 Neoplasm related pain (acute) (chronic): Secondary | ICD-10-CM | POA: Diagnosis not present

## 2023-04-22 DIAGNOSIS — J9690 Respiratory failure, unspecified, unspecified whether with hypoxia or hypercapnia: Secondary | ICD-10-CM | POA: Diagnosis not present

## 2023-04-23 DIAGNOSIS — R5381 Other malaise: Secondary | ICD-10-CM | POA: Diagnosis not present

## 2023-04-23 DIAGNOSIS — R1311 Dysphagia, oral phase: Secondary | ICD-10-CM | POA: Diagnosis not present

## 2023-04-23 DIAGNOSIS — R1310 Dysphagia, unspecified: Secondary | ICD-10-CM | POA: Diagnosis not present

## 2023-04-23 DIAGNOSIS — Z9911 Dependence on respirator [ventilator] status: Secondary | ICD-10-CM | POA: Diagnosis not present

## 2023-04-23 DIAGNOSIS — J9601 Acute respiratory failure with hypoxia: Secondary | ICD-10-CM | POA: Diagnosis not present

## 2023-04-23 DIAGNOSIS — E042 Nontoxic multinodular goiter: Secondary | ICD-10-CM | POA: Diagnosis not present

## 2023-04-23 DIAGNOSIS — I2510 Atherosclerotic heart disease of native coronary artery without angina pectoris: Secondary | ICD-10-CM | POA: Diagnosis not present

## 2023-04-23 DIAGNOSIS — J9611 Chronic respiratory failure with hypoxia: Secondary | ICD-10-CM | POA: Diagnosis not present

## 2023-04-23 DIAGNOSIS — I469 Cardiac arrest, cause unspecified: Secondary | ICD-10-CM | POA: Diagnosis not present

## 2023-04-23 DIAGNOSIS — I639 Cerebral infarction, unspecified: Secondary | ICD-10-CM | POA: Diagnosis not present

## 2023-04-23 DIAGNOSIS — I2699 Other pulmonary embolism without acute cor pulmonale: Secondary | ICD-10-CM | POA: Diagnosis not present

## 2023-04-24 DIAGNOSIS — I2510 Atherosclerotic heart disease of native coronary artery without angina pectoris: Secondary | ICD-10-CM | POA: Diagnosis not present

## 2023-04-24 DIAGNOSIS — J9601 Acute respiratory failure with hypoxia: Secondary | ICD-10-CM | POA: Diagnosis not present

## 2023-04-24 DIAGNOSIS — I639 Cerebral infarction, unspecified: Secondary | ICD-10-CM | POA: Diagnosis not present

## 2023-04-24 DIAGNOSIS — R1310 Dysphagia, unspecified: Secondary | ICD-10-CM | POA: Diagnosis not present

## 2023-04-24 DIAGNOSIS — I469 Cardiac arrest, cause unspecified: Secondary | ICD-10-CM | POA: Diagnosis not present

## 2023-04-24 DIAGNOSIS — R5381 Other malaise: Secondary | ICD-10-CM | POA: Diagnosis not present

## 2023-04-24 DIAGNOSIS — I2699 Other pulmonary embolism without acute cor pulmonale: Secondary | ICD-10-CM | POA: Diagnosis not present

## 2023-04-24 DIAGNOSIS — J9611 Chronic respiratory failure with hypoxia: Secondary | ICD-10-CM | POA: Diagnosis not present

## 2023-04-24 DIAGNOSIS — E042 Nontoxic multinodular goiter: Secondary | ICD-10-CM | POA: Diagnosis not present

## 2023-04-24 DIAGNOSIS — R1311 Dysphagia, oral phase: Secondary | ICD-10-CM | POA: Diagnosis not present

## 2023-04-24 DIAGNOSIS — Z9911 Dependence on respirator [ventilator] status: Secondary | ICD-10-CM | POA: Diagnosis not present

## 2023-04-25 DIAGNOSIS — G893 Neoplasm related pain (acute) (chronic): Secondary | ICD-10-CM | POA: Diagnosis not present

## 2023-04-25 DIAGNOSIS — R4189 Other symptoms and signs involving cognitive functions and awareness: Secondary | ICD-10-CM | POA: Diagnosis not present

## 2023-04-25 DIAGNOSIS — I2699 Other pulmonary embolism without acute cor pulmonale: Secondary | ICD-10-CM | POA: Diagnosis not present

## 2023-04-25 DIAGNOSIS — I69398 Other sequelae of cerebral infarction: Secondary | ICD-10-CM | POA: Diagnosis not present

## 2023-04-25 DIAGNOSIS — I10 Essential (primary) hypertension: Secondary | ICD-10-CM | POA: Diagnosis not present

## 2023-04-25 DIAGNOSIS — J9690 Respiratory failure, unspecified, unspecified whether with hypoxia or hypercapnia: Secondary | ICD-10-CM | POA: Diagnosis not present

## 2023-04-25 DIAGNOSIS — I2510 Atherosclerotic heart disease of native coronary artery without angina pectoris: Secondary | ICD-10-CM | POA: Diagnosis not present

## 2023-04-25 DIAGNOSIS — I469 Cardiac arrest, cause unspecified: Secondary | ICD-10-CM | POA: Diagnosis not present

## 2023-04-25 DIAGNOSIS — J9691 Respiratory failure, unspecified with hypoxia: Secondary | ICD-10-CM | POA: Diagnosis not present

## 2023-04-25 DIAGNOSIS — J9601 Acute respiratory failure with hypoxia: Secondary | ICD-10-CM | POA: Diagnosis not present

## 2023-04-25 DIAGNOSIS — G8929 Other chronic pain: Secondary | ICD-10-CM | POA: Diagnosis not present

## 2023-04-25 DIAGNOSIS — G8911 Acute pain due to trauma: Secondary | ICD-10-CM | POA: Diagnosis not present

## 2023-04-25 DIAGNOSIS — R2689 Other abnormalities of gait and mobility: Secondary | ICD-10-CM | POA: Diagnosis not present

## 2023-04-25 DIAGNOSIS — I639 Cerebral infarction, unspecified: Secondary | ICD-10-CM | POA: Diagnosis not present

## 2023-04-25 DIAGNOSIS — R1311 Dysphagia, oral phase: Secondary | ICD-10-CM | POA: Diagnosis not present

## 2023-04-25 DIAGNOSIS — Z9911 Dependence on respirator [ventilator] status: Secondary | ICD-10-CM | POA: Diagnosis not present

## 2023-04-25 DIAGNOSIS — E042 Nontoxic multinodular goiter: Secondary | ICD-10-CM | POA: Diagnosis not present

## 2023-04-25 DIAGNOSIS — R1310 Dysphagia, unspecified: Secondary | ICD-10-CM | POA: Diagnosis not present

## 2023-04-25 DIAGNOSIS — J188 Other pneumonia, unspecified organism: Secondary | ICD-10-CM | POA: Diagnosis not present

## 2023-04-25 DIAGNOSIS — J9611 Chronic respiratory failure with hypoxia: Secondary | ICD-10-CM | POA: Diagnosis not present

## 2023-04-25 DIAGNOSIS — R5381 Other malaise: Secondary | ICD-10-CM | POA: Diagnosis not present

## 2023-04-25 DIAGNOSIS — R0603 Acute respiratory distress: Secondary | ICD-10-CM | POA: Diagnosis not present

## 2023-04-26 DIAGNOSIS — R0989 Other specified symptoms and signs involving the circulatory and respiratory systems: Secondary | ICD-10-CM | POA: Diagnosis not present

## 2023-04-26 DIAGNOSIS — I469 Cardiac arrest, cause unspecified: Secondary | ICD-10-CM | POA: Diagnosis not present

## 2023-04-26 DIAGNOSIS — R5381 Other malaise: Secondary | ICD-10-CM | POA: Diagnosis not present

## 2023-04-26 DIAGNOSIS — F411 Generalized anxiety disorder: Secondary | ICD-10-CM | POA: Diagnosis not present

## 2023-04-26 DIAGNOSIS — R1310 Dysphagia, unspecified: Secondary | ICD-10-CM | POA: Diagnosis not present

## 2023-04-26 DIAGNOSIS — J9601 Acute respiratory failure with hypoxia: Secondary | ICD-10-CM | POA: Diagnosis not present

## 2023-04-26 DIAGNOSIS — F05 Delirium due to known physiological condition: Secondary | ICD-10-CM | POA: Diagnosis not present

## 2023-04-26 DIAGNOSIS — I639 Cerebral infarction, unspecified: Secondary | ICD-10-CM | POA: Diagnosis not present

## 2023-04-26 DIAGNOSIS — I2699 Other pulmonary embolism without acute cor pulmonale: Secondary | ICD-10-CM | POA: Diagnosis not present

## 2023-04-27 DIAGNOSIS — G893 Neoplasm related pain (acute) (chronic): Secondary | ICD-10-CM | POA: Diagnosis not present

## 2023-04-27 DIAGNOSIS — I639 Cerebral infarction, unspecified: Secondary | ICD-10-CM | POA: Diagnosis not present

## 2023-04-27 DIAGNOSIS — I469 Cardiac arrest, cause unspecified: Secondary | ICD-10-CM | POA: Diagnosis not present

## 2023-04-27 DIAGNOSIS — J9601 Acute respiratory failure with hypoxia: Secondary | ICD-10-CM | POA: Diagnosis not present

## 2023-04-27 DIAGNOSIS — G8911 Acute pain due to trauma: Secondary | ICD-10-CM | POA: Diagnosis not present

## 2023-04-27 DIAGNOSIS — J9690 Respiratory failure, unspecified, unspecified whether with hypoxia or hypercapnia: Secondary | ICD-10-CM | POA: Diagnosis not present

## 2023-04-27 DIAGNOSIS — I2510 Atherosclerotic heart disease of native coronary artery without angina pectoris: Secondary | ICD-10-CM | POA: Diagnosis not present

## 2023-04-27 DIAGNOSIS — Z9911 Dependence on respirator [ventilator] status: Secondary | ICD-10-CM | POA: Diagnosis not present

## 2023-04-27 DIAGNOSIS — I10 Essential (primary) hypertension: Secondary | ICD-10-CM | POA: Diagnosis not present

## 2023-04-27 DIAGNOSIS — I2699 Other pulmonary embolism without acute cor pulmonale: Secondary | ICD-10-CM | POA: Diagnosis not present

## 2023-04-27 DIAGNOSIS — G8929 Other chronic pain: Secondary | ICD-10-CM | POA: Diagnosis not present

## 2023-04-27 DIAGNOSIS — R1310 Dysphagia, unspecified: Secondary | ICD-10-CM | POA: Diagnosis not present

## 2023-04-27 DIAGNOSIS — R5381 Other malaise: Secondary | ICD-10-CM | POA: Diagnosis not present

## 2023-04-28 DIAGNOSIS — R188 Other ascites: Secondary | ICD-10-CM | POA: Diagnosis not present

## 2023-04-28 DIAGNOSIS — J9691 Respiratory failure, unspecified with hypoxia: Secondary | ICD-10-CM | POA: Diagnosis not present

## 2023-04-28 DIAGNOSIS — I469 Cardiac arrest, cause unspecified: Secondary | ICD-10-CM | POA: Diagnosis not present

## 2023-04-28 DIAGNOSIS — R5381 Other malaise: Secondary | ICD-10-CM | POA: Diagnosis not present

## 2023-04-28 DIAGNOSIS — I69398 Other sequelae of cerebral infarction: Secondary | ICD-10-CM | POA: Diagnosis not present

## 2023-04-28 DIAGNOSIS — R1310 Dysphagia, unspecified: Secondary | ICD-10-CM | POA: Diagnosis not present

## 2023-04-28 DIAGNOSIS — J9601 Acute respiratory failure with hypoxia: Secondary | ICD-10-CM | POA: Diagnosis not present

## 2023-04-28 DIAGNOSIS — I639 Cerebral infarction, unspecified: Secondary | ICD-10-CM | POA: Diagnosis not present

## 2023-04-28 DIAGNOSIS — R4189 Other symptoms and signs involving cognitive functions and awareness: Secondary | ICD-10-CM | POA: Diagnosis not present

## 2023-04-28 DIAGNOSIS — R2689 Other abnormalities of gait and mobility: Secondary | ICD-10-CM | POA: Diagnosis not present

## 2023-04-28 DIAGNOSIS — R918 Other nonspecific abnormal finding of lung field: Secondary | ICD-10-CM | POA: Diagnosis not present

## 2023-04-28 DIAGNOSIS — J188 Other pneumonia, unspecified organism: Secondary | ICD-10-CM | POA: Diagnosis not present

## 2023-04-28 DIAGNOSIS — I2699 Other pulmonary embolism without acute cor pulmonale: Secondary | ICD-10-CM | POA: Diagnosis not present

## 2023-04-29 DIAGNOSIS — I639 Cerebral infarction, unspecified: Secondary | ICD-10-CM | POA: Diagnosis not present

## 2023-04-29 DIAGNOSIS — I2510 Atherosclerotic heart disease of native coronary artery without angina pectoris: Secondary | ICD-10-CM | POA: Diagnosis not present

## 2023-04-29 DIAGNOSIS — R1310 Dysphagia, unspecified: Secondary | ICD-10-CM | POA: Diagnosis not present

## 2023-04-29 DIAGNOSIS — Z9911 Dependence on respirator [ventilator] status: Secondary | ICD-10-CM | POA: Diagnosis not present

## 2023-04-29 DIAGNOSIS — I10 Essential (primary) hypertension: Secondary | ICD-10-CM | POA: Diagnosis not present

## 2023-04-29 DIAGNOSIS — I469 Cardiac arrest, cause unspecified: Secondary | ICD-10-CM | POA: Diagnosis not present

## 2023-04-29 DIAGNOSIS — R5381 Other malaise: Secondary | ICD-10-CM | POA: Diagnosis not present

## 2023-04-29 DIAGNOSIS — G893 Neoplasm related pain (acute) (chronic): Secondary | ICD-10-CM | POA: Diagnosis not present

## 2023-04-29 DIAGNOSIS — G8929 Other chronic pain: Secondary | ICD-10-CM | POA: Diagnosis not present

## 2023-04-29 DIAGNOSIS — J9690 Respiratory failure, unspecified, unspecified whether with hypoxia or hypercapnia: Secondary | ICD-10-CM | POA: Diagnosis not present

## 2023-04-29 DIAGNOSIS — J9601 Acute respiratory failure with hypoxia: Secondary | ICD-10-CM | POA: Diagnosis not present

## 2023-04-29 DIAGNOSIS — G8911 Acute pain due to trauma: Secondary | ICD-10-CM | POA: Diagnosis not present

## 2023-04-29 DIAGNOSIS — I2699 Other pulmonary embolism without acute cor pulmonale: Secondary | ICD-10-CM | POA: Diagnosis not present

## 2023-04-30 DIAGNOSIS — R5381 Other malaise: Secondary | ICD-10-CM | POA: Diagnosis not present

## 2023-04-30 DIAGNOSIS — R1310 Dysphagia, unspecified: Secondary | ICD-10-CM | POA: Diagnosis not present

## 2023-04-30 DIAGNOSIS — J9601 Acute respiratory failure with hypoxia: Secondary | ICD-10-CM | POA: Diagnosis not present

## 2023-04-30 DIAGNOSIS — I2699 Other pulmonary embolism without acute cor pulmonale: Secondary | ICD-10-CM | POA: Diagnosis not present

## 2023-04-30 DIAGNOSIS — I639 Cerebral infarction, unspecified: Secondary | ICD-10-CM | POA: Diagnosis not present

## 2023-05-01 DIAGNOSIS — R5381 Other malaise: Secondary | ICD-10-CM | POA: Diagnosis not present

## 2023-05-01 DIAGNOSIS — I2699 Other pulmonary embolism without acute cor pulmonale: Secondary | ICD-10-CM | POA: Diagnosis not present

## 2023-05-01 DIAGNOSIS — J9601 Acute respiratory failure with hypoxia: Secondary | ICD-10-CM | POA: Diagnosis not present

## 2023-05-01 DIAGNOSIS — R1310 Dysphagia, unspecified: Secondary | ICD-10-CM | POA: Diagnosis not present

## 2023-05-01 DIAGNOSIS — I639 Cerebral infarction, unspecified: Secondary | ICD-10-CM | POA: Diagnosis not present

## 2023-05-02 DIAGNOSIS — I639 Cerebral infarction, unspecified: Secondary | ICD-10-CM | POA: Diagnosis not present

## 2023-05-02 DIAGNOSIS — R1310 Dysphagia, unspecified: Secondary | ICD-10-CM | POA: Diagnosis not present

## 2023-05-02 DIAGNOSIS — Z9911 Dependence on respirator [ventilator] status: Secondary | ICD-10-CM | POA: Diagnosis not present

## 2023-05-02 DIAGNOSIS — G8929 Other chronic pain: Secondary | ICD-10-CM | POA: Diagnosis not present

## 2023-05-02 DIAGNOSIS — J9601 Acute respiratory failure with hypoxia: Secondary | ICD-10-CM | POA: Diagnosis not present

## 2023-05-02 DIAGNOSIS — G8911 Acute pain due to trauma: Secondary | ICD-10-CM | POA: Diagnosis not present

## 2023-05-02 DIAGNOSIS — R5381 Other malaise: Secondary | ICD-10-CM | POA: Diagnosis not present

## 2023-05-02 DIAGNOSIS — I2510 Atherosclerotic heart disease of native coronary artery without angina pectoris: Secondary | ICD-10-CM | POA: Diagnosis not present

## 2023-05-02 DIAGNOSIS — G893 Neoplasm related pain (acute) (chronic): Secondary | ICD-10-CM | POA: Diagnosis not present

## 2023-05-02 DIAGNOSIS — I2699 Other pulmonary embolism without acute cor pulmonale: Secondary | ICD-10-CM | POA: Diagnosis not present

## 2023-05-02 DIAGNOSIS — J9690 Respiratory failure, unspecified, unspecified whether with hypoxia or hypercapnia: Secondary | ICD-10-CM | POA: Diagnosis not present

## 2023-05-02 DIAGNOSIS — I10 Essential (primary) hypertension: Secondary | ICD-10-CM | POA: Diagnosis not present

## 2023-05-03 DIAGNOSIS — R2689 Other abnormalities of gait and mobility: Secondary | ICD-10-CM | POA: Diagnosis not present

## 2023-05-03 DIAGNOSIS — J9601 Acute respiratory failure with hypoxia: Secondary | ICD-10-CM | POA: Diagnosis not present

## 2023-05-03 DIAGNOSIS — R5381 Other malaise: Secondary | ICD-10-CM | POA: Diagnosis not present

## 2023-05-03 DIAGNOSIS — I469 Cardiac arrest, cause unspecified: Secondary | ICD-10-CM | POA: Diagnosis not present

## 2023-05-03 DIAGNOSIS — I2699 Other pulmonary embolism without acute cor pulmonale: Secondary | ICD-10-CM | POA: Diagnosis not present

## 2023-05-03 DIAGNOSIS — J9691 Respiratory failure, unspecified with hypoxia: Secondary | ICD-10-CM | POA: Diagnosis not present

## 2023-05-03 DIAGNOSIS — I69398 Other sequelae of cerebral infarction: Secondary | ICD-10-CM | POA: Diagnosis not present

## 2023-05-03 DIAGNOSIS — R1310 Dysphagia, unspecified: Secondary | ICD-10-CM | POA: Diagnosis not present

## 2023-05-03 DIAGNOSIS — R4189 Other symptoms and signs involving cognitive functions and awareness: Secondary | ICD-10-CM | POA: Diagnosis not present

## 2023-05-03 DIAGNOSIS — I639 Cerebral infarction, unspecified: Secondary | ICD-10-CM | POA: Diagnosis not present

## 2023-05-03 DIAGNOSIS — J188 Other pneumonia, unspecified organism: Secondary | ICD-10-CM | POA: Diagnosis not present

## 2023-05-04 DIAGNOSIS — I639 Cerebral infarction, unspecified: Secondary | ICD-10-CM | POA: Diagnosis not present

## 2023-05-04 DIAGNOSIS — J9690 Respiratory failure, unspecified, unspecified whether with hypoxia or hypercapnia: Secondary | ICD-10-CM | POA: Diagnosis not present

## 2023-05-04 DIAGNOSIS — R1310 Dysphagia, unspecified: Secondary | ICD-10-CM | POA: Diagnosis not present

## 2023-05-04 DIAGNOSIS — J398 Other specified diseases of upper respiratory tract: Secondary | ICD-10-CM | POA: Diagnosis not present

## 2023-05-04 DIAGNOSIS — G8929 Other chronic pain: Secondary | ICD-10-CM | POA: Diagnosis not present

## 2023-05-04 DIAGNOSIS — J9601 Acute respiratory failure with hypoxia: Secondary | ICD-10-CM | POA: Diagnosis not present

## 2023-05-04 DIAGNOSIS — G893 Neoplasm related pain (acute) (chronic): Secondary | ICD-10-CM | POA: Diagnosis not present

## 2023-05-04 DIAGNOSIS — Z9911 Dependence on respirator [ventilator] status: Secondary | ICD-10-CM | POA: Diagnosis not present

## 2023-05-04 DIAGNOSIS — Z4682 Encounter for fitting and adjustment of non-vascular catheter: Secondary | ICD-10-CM | POA: Diagnosis not present

## 2023-05-04 DIAGNOSIS — I2510 Atherosclerotic heart disease of native coronary artery without angina pectoris: Secondary | ICD-10-CM | POA: Diagnosis not present

## 2023-05-04 DIAGNOSIS — I10 Essential (primary) hypertension: Secondary | ICD-10-CM | POA: Diagnosis not present

## 2023-05-04 DIAGNOSIS — I2699 Other pulmonary embolism without acute cor pulmonale: Secondary | ICD-10-CM | POA: Diagnosis not present

## 2023-05-04 DIAGNOSIS — G8911 Acute pain due to trauma: Secondary | ICD-10-CM | POA: Diagnosis not present

## 2023-05-04 DIAGNOSIS — R5381 Other malaise: Secondary | ICD-10-CM | POA: Diagnosis not present

## 2023-05-05 ENCOUNTER — Ambulatory Visit: Payer: Medicare Other | Attending: Radiation Oncology | Admitting: Radiation Oncology

## 2023-05-05 DIAGNOSIS — I2699 Other pulmonary embolism without acute cor pulmonale: Secondary | ICD-10-CM | POA: Diagnosis not present

## 2023-05-05 DIAGNOSIS — R5381 Other malaise: Secondary | ICD-10-CM | POA: Diagnosis not present

## 2023-05-05 DIAGNOSIS — I69398 Other sequelae of cerebral infarction: Secondary | ICD-10-CM | POA: Diagnosis not present

## 2023-05-05 DIAGNOSIS — R2689 Other abnormalities of gait and mobility: Secondary | ICD-10-CM | POA: Diagnosis not present

## 2023-05-05 DIAGNOSIS — R1310 Dysphagia, unspecified: Secondary | ICD-10-CM | POA: Diagnosis not present

## 2023-05-05 DIAGNOSIS — I469 Cardiac arrest, cause unspecified: Secondary | ICD-10-CM | POA: Diagnosis not present

## 2023-05-05 DIAGNOSIS — J9691 Respiratory failure, unspecified with hypoxia: Secondary | ICD-10-CM | POA: Diagnosis not present

## 2023-05-05 DIAGNOSIS — J9601 Acute respiratory failure with hypoxia: Secondary | ICD-10-CM | POA: Diagnosis not present

## 2023-05-05 DIAGNOSIS — J188 Other pneumonia, unspecified organism: Secondary | ICD-10-CM | POA: Diagnosis not present

## 2023-05-05 DIAGNOSIS — R4189 Other symptoms and signs involving cognitive functions and awareness: Secondary | ICD-10-CM | POA: Diagnosis not present

## 2023-05-05 DIAGNOSIS — I639 Cerebral infarction, unspecified: Secondary | ICD-10-CM | POA: Diagnosis not present

## 2023-05-06 DIAGNOSIS — R5381 Other malaise: Secondary | ICD-10-CM | POA: Diagnosis not present

## 2023-05-06 DIAGNOSIS — G893 Neoplasm related pain (acute) (chronic): Secondary | ICD-10-CM | POA: Diagnosis not present

## 2023-05-06 DIAGNOSIS — G8911 Acute pain due to trauma: Secondary | ICD-10-CM | POA: Diagnosis not present

## 2023-05-06 DIAGNOSIS — I2510 Atherosclerotic heart disease of native coronary artery without angina pectoris: Secondary | ICD-10-CM | POA: Diagnosis not present

## 2023-05-06 DIAGNOSIS — J9690 Respiratory failure, unspecified, unspecified whether with hypoxia or hypercapnia: Secondary | ICD-10-CM | POA: Diagnosis not present

## 2023-05-06 DIAGNOSIS — Z930 Tracheostomy status: Secondary | ICD-10-CM | POA: Diagnosis not present

## 2023-05-06 DIAGNOSIS — I10 Essential (primary) hypertension: Secondary | ICD-10-CM | POA: Diagnosis not present

## 2023-05-06 DIAGNOSIS — J9621 Acute and chronic respiratory failure with hypoxia: Secondary | ICD-10-CM | POA: Diagnosis not present

## 2023-05-06 DIAGNOSIS — J9601 Acute respiratory failure with hypoxia: Secondary | ICD-10-CM | POA: Diagnosis not present

## 2023-05-06 DIAGNOSIS — R1310 Dysphagia, unspecified: Secondary | ICD-10-CM | POA: Diagnosis not present

## 2023-05-06 DIAGNOSIS — G8929 Other chronic pain: Secondary | ICD-10-CM | POA: Diagnosis not present

## 2023-05-06 DIAGNOSIS — I469 Cardiac arrest, cause unspecified: Secondary | ICD-10-CM | POA: Diagnosis not present

## 2023-05-06 DIAGNOSIS — Z9911 Dependence on respirator [ventilator] status: Secondary | ICD-10-CM | POA: Diagnosis not present

## 2023-05-06 DIAGNOSIS — I2699 Other pulmonary embolism without acute cor pulmonale: Secondary | ICD-10-CM | POA: Diagnosis not present

## 2023-05-06 DIAGNOSIS — I5022 Chronic systolic (congestive) heart failure: Secondary | ICD-10-CM | POA: Diagnosis not present

## 2023-05-06 DIAGNOSIS — I639 Cerebral infarction, unspecified: Secondary | ICD-10-CM | POA: Diagnosis not present

## 2023-05-07 DIAGNOSIS — R109 Unspecified abdominal pain: Secondary | ICD-10-CM | POA: Diagnosis not present

## 2023-05-07 DIAGNOSIS — I469 Cardiac arrest, cause unspecified: Secondary | ICD-10-CM | POA: Diagnosis not present

## 2023-05-07 DIAGNOSIS — R5381 Other malaise: Secondary | ICD-10-CM | POA: Diagnosis not present

## 2023-05-07 DIAGNOSIS — R1310 Dysphagia, unspecified: Secondary | ICD-10-CM | POA: Diagnosis not present

## 2023-05-07 DIAGNOSIS — I2699 Other pulmonary embolism without acute cor pulmonale: Secondary | ICD-10-CM | POA: Diagnosis not present

## 2023-05-07 DIAGNOSIS — R112 Nausea with vomiting, unspecified: Secondary | ICD-10-CM | POA: Diagnosis not present

## 2023-05-07 DIAGNOSIS — J9601 Acute respiratory failure with hypoxia: Secondary | ICD-10-CM | POA: Diagnosis not present

## 2023-05-07 DIAGNOSIS — I5022 Chronic systolic (congestive) heart failure: Secondary | ICD-10-CM | POA: Diagnosis not present

## 2023-05-07 DIAGNOSIS — R0989 Other specified symptoms and signs involving the circulatory and respiratory systems: Secondary | ICD-10-CM | POA: Diagnosis not present

## 2023-05-07 DIAGNOSIS — J9621 Acute and chronic respiratory failure with hypoxia: Secondary | ICD-10-CM | POA: Diagnosis not present

## 2023-05-07 DIAGNOSIS — I639 Cerebral infarction, unspecified: Secondary | ICD-10-CM | POA: Diagnosis not present

## 2023-05-07 DIAGNOSIS — Z930 Tracheostomy status: Secondary | ICD-10-CM | POA: Diagnosis not present

## 2023-05-08 DIAGNOSIS — I469 Cardiac arrest, cause unspecified: Secondary | ICD-10-CM | POA: Diagnosis not present

## 2023-05-08 DIAGNOSIS — R1310 Dysphagia, unspecified: Secondary | ICD-10-CM | POA: Diagnosis not present

## 2023-05-08 DIAGNOSIS — I2699 Other pulmonary embolism without acute cor pulmonale: Secondary | ICD-10-CM | POA: Diagnosis not present

## 2023-05-08 DIAGNOSIS — I639 Cerebral infarction, unspecified: Secondary | ICD-10-CM | POA: Diagnosis not present

## 2023-05-08 DIAGNOSIS — J9601 Acute respiratory failure with hypoxia: Secondary | ICD-10-CM | POA: Diagnosis not present

## 2023-05-08 DIAGNOSIS — I5022 Chronic systolic (congestive) heart failure: Secondary | ICD-10-CM | POA: Diagnosis not present

## 2023-05-08 DIAGNOSIS — Z930 Tracheostomy status: Secondary | ICD-10-CM | POA: Diagnosis not present

## 2023-05-08 DIAGNOSIS — J9621 Acute and chronic respiratory failure with hypoxia: Secondary | ICD-10-CM | POA: Diagnosis not present

## 2023-05-08 DIAGNOSIS — R5381 Other malaise: Secondary | ICD-10-CM | POA: Diagnosis not present

## 2023-05-09 DIAGNOSIS — I69398 Other sequelae of cerebral infarction: Secondary | ICD-10-CM | POA: Diagnosis not present

## 2023-05-09 DIAGNOSIS — I469 Cardiac arrest, cause unspecified: Secondary | ICD-10-CM | POA: Diagnosis not present

## 2023-05-09 DIAGNOSIS — Z930 Tracheostomy status: Secondary | ICD-10-CM | POA: Diagnosis not present

## 2023-05-09 DIAGNOSIS — Z9911 Dependence on respirator [ventilator] status: Secondary | ICD-10-CM | POA: Diagnosis not present

## 2023-05-09 DIAGNOSIS — R1310 Dysphagia, unspecified: Secondary | ICD-10-CM | POA: Diagnosis not present

## 2023-05-09 DIAGNOSIS — J188 Other pneumonia, unspecified organism: Secondary | ICD-10-CM | POA: Diagnosis not present

## 2023-05-09 DIAGNOSIS — G893 Neoplasm related pain (acute) (chronic): Secondary | ICD-10-CM | POA: Diagnosis not present

## 2023-05-09 DIAGNOSIS — J9621 Acute and chronic respiratory failure with hypoxia: Secondary | ICD-10-CM | POA: Diagnosis not present

## 2023-05-09 DIAGNOSIS — I2510 Atherosclerotic heart disease of native coronary artery without angina pectoris: Secondary | ICD-10-CM | POA: Diagnosis not present

## 2023-05-09 DIAGNOSIS — J9690 Respiratory failure, unspecified, unspecified whether with hypoxia or hypercapnia: Secondary | ICD-10-CM | POA: Diagnosis not present

## 2023-05-09 DIAGNOSIS — R5381 Other malaise: Secondary | ICD-10-CM | POA: Diagnosis not present

## 2023-05-09 DIAGNOSIS — I2699 Other pulmonary embolism without acute cor pulmonale: Secondary | ICD-10-CM | POA: Diagnosis not present

## 2023-05-09 DIAGNOSIS — J9601 Acute respiratory failure with hypoxia: Secondary | ICD-10-CM | POA: Diagnosis not present

## 2023-05-09 DIAGNOSIS — G8911 Acute pain due to trauma: Secondary | ICD-10-CM | POA: Diagnosis not present

## 2023-05-09 DIAGNOSIS — I639 Cerebral infarction, unspecified: Secondary | ICD-10-CM | POA: Diagnosis not present

## 2023-05-09 DIAGNOSIS — G8929 Other chronic pain: Secondary | ICD-10-CM | POA: Diagnosis not present

## 2023-05-09 DIAGNOSIS — I5022 Chronic systolic (congestive) heart failure: Secondary | ICD-10-CM | POA: Diagnosis not present

## 2023-05-09 DIAGNOSIS — J9691 Respiratory failure, unspecified with hypoxia: Secondary | ICD-10-CM | POA: Diagnosis not present

## 2023-05-09 DIAGNOSIS — R2689 Other abnormalities of gait and mobility: Secondary | ICD-10-CM | POA: Diagnosis not present

## 2023-05-09 DIAGNOSIS — I10 Essential (primary) hypertension: Secondary | ICD-10-CM | POA: Diagnosis not present

## 2023-05-09 DIAGNOSIS — R4189 Other symptoms and signs involving cognitive functions and awareness: Secondary | ICD-10-CM | POA: Diagnosis not present

## 2023-05-10 DIAGNOSIS — R5381 Other malaise: Secondary | ICD-10-CM | POA: Diagnosis not present

## 2023-05-10 DIAGNOSIS — R1310 Dysphagia, unspecified: Secondary | ICD-10-CM | POA: Diagnosis not present

## 2023-05-10 DIAGNOSIS — I639 Cerebral infarction, unspecified: Secondary | ICD-10-CM | POA: Diagnosis not present

## 2023-05-10 DIAGNOSIS — I2699 Other pulmonary embolism without acute cor pulmonale: Secondary | ICD-10-CM | POA: Diagnosis not present

## 2023-05-10 DIAGNOSIS — I96 Gangrene, not elsewhere classified: Secondary | ICD-10-CM | POA: Diagnosis not present

## 2023-05-10 DIAGNOSIS — S41101A Unspecified open wound of right upper arm, initial encounter: Secondary | ICD-10-CM | POA: Diagnosis not present

## 2023-05-10 DIAGNOSIS — Z930 Tracheostomy status: Secondary | ICD-10-CM | POA: Diagnosis not present

## 2023-05-10 DIAGNOSIS — I5022 Chronic systolic (congestive) heart failure: Secondary | ICD-10-CM | POA: Diagnosis not present

## 2023-05-10 DIAGNOSIS — I509 Heart failure, unspecified: Secondary | ICD-10-CM | POA: Diagnosis not present

## 2023-05-10 DIAGNOSIS — J9621 Acute and chronic respiratory failure with hypoxia: Secondary | ICD-10-CM | POA: Diagnosis not present

## 2023-05-10 DIAGNOSIS — T82598A Other mechanical complication of other cardiac and vascular devices and implants, initial encounter: Secondary | ICD-10-CM | POA: Diagnosis not present

## 2023-05-10 DIAGNOSIS — J9601 Acute respiratory failure with hypoxia: Secondary | ICD-10-CM | POA: Diagnosis not present

## 2023-05-10 DIAGNOSIS — I110 Hypertensive heart disease with heart failure: Secondary | ICD-10-CM | POA: Diagnosis not present

## 2023-05-10 DIAGNOSIS — I469 Cardiac arrest, cause unspecified: Secondary | ICD-10-CM | POA: Diagnosis not present

## 2023-05-11 DIAGNOSIS — I2699 Other pulmonary embolism without acute cor pulmonale: Secondary | ICD-10-CM | POA: Diagnosis not present

## 2023-05-11 DIAGNOSIS — Z930 Tracheostomy status: Secondary | ICD-10-CM | POA: Diagnosis not present

## 2023-05-11 DIAGNOSIS — I2510 Atherosclerotic heart disease of native coronary artery without angina pectoris: Secondary | ICD-10-CM | POA: Diagnosis not present

## 2023-05-11 DIAGNOSIS — Z4682 Encounter for fitting and adjustment of non-vascular catheter: Secondary | ICD-10-CM | POA: Diagnosis not present

## 2023-05-11 DIAGNOSIS — I10 Essential (primary) hypertension: Secondary | ICD-10-CM | POA: Diagnosis not present

## 2023-05-11 DIAGNOSIS — G8929 Other chronic pain: Secondary | ICD-10-CM | POA: Diagnosis not present

## 2023-05-11 DIAGNOSIS — Z9911 Dependence on respirator [ventilator] status: Secondary | ICD-10-CM | POA: Diagnosis not present

## 2023-05-11 DIAGNOSIS — R5381 Other malaise: Secondary | ICD-10-CM | POA: Diagnosis not present

## 2023-05-11 DIAGNOSIS — J9690 Respiratory failure, unspecified, unspecified whether with hypoxia or hypercapnia: Secondary | ICD-10-CM | POA: Diagnosis not present

## 2023-05-11 DIAGNOSIS — I469 Cardiac arrest, cause unspecified: Secondary | ICD-10-CM | POA: Diagnosis not present

## 2023-05-11 DIAGNOSIS — J398 Other specified diseases of upper respiratory tract: Secondary | ICD-10-CM | POA: Diagnosis not present

## 2023-05-11 DIAGNOSIS — I5022 Chronic systolic (congestive) heart failure: Secondary | ICD-10-CM | POA: Diagnosis not present

## 2023-05-11 DIAGNOSIS — I639 Cerebral infarction, unspecified: Secondary | ICD-10-CM | POA: Diagnosis not present

## 2023-05-11 DIAGNOSIS — G893 Neoplasm related pain (acute) (chronic): Secondary | ICD-10-CM | POA: Diagnosis not present

## 2023-05-11 DIAGNOSIS — J9601 Acute respiratory failure with hypoxia: Secondary | ICD-10-CM | POA: Diagnosis not present

## 2023-05-11 DIAGNOSIS — R1310 Dysphagia, unspecified: Secondary | ICD-10-CM | POA: Diagnosis not present

## 2023-05-11 DIAGNOSIS — J9621 Acute and chronic respiratory failure with hypoxia: Secondary | ICD-10-CM | POA: Diagnosis not present

## 2023-05-11 DIAGNOSIS — G8911 Acute pain due to trauma: Secondary | ICD-10-CM | POA: Diagnosis not present

## 2023-05-12 DIAGNOSIS — I639 Cerebral infarction, unspecified: Secondary | ICD-10-CM | POA: Diagnosis not present

## 2023-05-12 DIAGNOSIS — I2699 Other pulmonary embolism without acute cor pulmonale: Secondary | ICD-10-CM | POA: Diagnosis not present

## 2023-05-12 DIAGNOSIS — I469 Cardiac arrest, cause unspecified: Secondary | ICD-10-CM | POA: Diagnosis not present

## 2023-05-12 DIAGNOSIS — R1310 Dysphagia, unspecified: Secondary | ICD-10-CM | POA: Diagnosis not present

## 2023-05-12 DIAGNOSIS — I5022 Chronic systolic (congestive) heart failure: Secondary | ICD-10-CM | POA: Diagnosis not present

## 2023-05-12 DIAGNOSIS — R5381 Other malaise: Secondary | ICD-10-CM | POA: Diagnosis not present

## 2023-05-12 DIAGNOSIS — J9621 Acute and chronic respiratory failure with hypoxia: Secondary | ICD-10-CM | POA: Diagnosis not present

## 2023-05-12 DIAGNOSIS — Z930 Tracheostomy status: Secondary | ICD-10-CM | POA: Diagnosis not present

## 2023-05-12 DIAGNOSIS — J9601 Acute respiratory failure with hypoxia: Secondary | ICD-10-CM | POA: Diagnosis not present

## 2023-05-13 DIAGNOSIS — G8911 Acute pain due to trauma: Secondary | ICD-10-CM | POA: Diagnosis not present

## 2023-05-13 DIAGNOSIS — Z9911 Dependence on respirator [ventilator] status: Secondary | ICD-10-CM | POA: Diagnosis not present

## 2023-05-13 DIAGNOSIS — I2699 Other pulmonary embolism without acute cor pulmonale: Secondary | ICD-10-CM | POA: Diagnosis not present

## 2023-05-13 DIAGNOSIS — R5381 Other malaise: Secondary | ICD-10-CM | POA: Diagnosis not present

## 2023-05-13 DIAGNOSIS — G8929 Other chronic pain: Secondary | ICD-10-CM | POA: Diagnosis not present

## 2023-05-13 DIAGNOSIS — R1310 Dysphagia, unspecified: Secondary | ICD-10-CM | POA: Diagnosis not present

## 2023-05-13 DIAGNOSIS — I10 Essential (primary) hypertension: Secondary | ICD-10-CM | POA: Diagnosis not present

## 2023-05-13 DIAGNOSIS — I639 Cerebral infarction, unspecified: Secondary | ICD-10-CM | POA: Diagnosis not present

## 2023-05-13 DIAGNOSIS — J9690 Respiratory failure, unspecified, unspecified whether with hypoxia or hypercapnia: Secondary | ICD-10-CM | POA: Diagnosis not present

## 2023-05-13 DIAGNOSIS — J9601 Acute respiratory failure with hypoxia: Secondary | ICD-10-CM | POA: Diagnosis not present

## 2023-05-13 DIAGNOSIS — G893 Neoplasm related pain (acute) (chronic): Secondary | ICD-10-CM | POA: Diagnosis not present

## 2023-05-13 DIAGNOSIS — I2510 Atherosclerotic heart disease of native coronary artery without angina pectoris: Secondary | ICD-10-CM | POA: Diagnosis not present

## 2023-05-14 DIAGNOSIS — R1310 Dysphagia, unspecified: Secondary | ICD-10-CM

## 2023-05-14 DIAGNOSIS — I639 Cerebral infarction, unspecified: Secondary | ICD-10-CM

## 2023-05-14 DIAGNOSIS — J9601 Acute respiratory failure with hypoxia: Secondary | ICD-10-CM

## 2023-05-14 DIAGNOSIS — I2699 Other pulmonary embolism without acute cor pulmonale: Secondary | ICD-10-CM

## 2023-05-14 DIAGNOSIS — R5381 Other malaise: Secondary | ICD-10-CM

## 2023-05-15 DIAGNOSIS — J9621 Acute and chronic respiratory failure with hypoxia: Secondary | ICD-10-CM | POA: Diagnosis not present

## 2023-05-15 DIAGNOSIS — I469 Cardiac arrest, cause unspecified: Secondary | ICD-10-CM | POA: Diagnosis not present

## 2023-05-15 DIAGNOSIS — J9601 Acute respiratory failure with hypoxia: Secondary | ICD-10-CM | POA: Diagnosis not present

## 2023-05-15 DIAGNOSIS — I2699 Other pulmonary embolism without acute cor pulmonale: Secondary | ICD-10-CM | POA: Diagnosis not present

## 2023-05-15 DIAGNOSIS — I5022 Chronic systolic (congestive) heart failure: Secondary | ICD-10-CM | POA: Diagnosis not present

## 2023-05-15 DIAGNOSIS — Z930 Tracheostomy status: Secondary | ICD-10-CM | POA: Diagnosis not present

## 2023-05-15 DIAGNOSIS — I639 Cerebral infarction, unspecified: Secondary | ICD-10-CM | POA: Diagnosis not present

## 2023-05-15 DIAGNOSIS — R5381 Other malaise: Secondary | ICD-10-CM | POA: Diagnosis not present

## 2023-05-15 DIAGNOSIS — R1310 Dysphagia, unspecified: Secondary | ICD-10-CM | POA: Diagnosis not present

## 2023-05-16 DIAGNOSIS — I469 Cardiac arrest, cause unspecified: Secondary | ICD-10-CM | POA: Diagnosis not present

## 2023-05-16 DIAGNOSIS — R5381 Other malaise: Secondary | ICD-10-CM | POA: Diagnosis not present

## 2023-05-16 DIAGNOSIS — Z9911 Dependence on respirator [ventilator] status: Secondary | ICD-10-CM | POA: Diagnosis not present

## 2023-05-16 DIAGNOSIS — J9690 Respiratory failure, unspecified, unspecified whether with hypoxia or hypercapnia: Secondary | ICD-10-CM | POA: Diagnosis not present

## 2023-05-16 DIAGNOSIS — G8929 Other chronic pain: Secondary | ICD-10-CM | POA: Diagnosis not present

## 2023-05-16 DIAGNOSIS — R1310 Dysphagia, unspecified: Secondary | ICD-10-CM | POA: Diagnosis not present

## 2023-05-16 DIAGNOSIS — Z930 Tracheostomy status: Secondary | ICD-10-CM | POA: Diagnosis not present

## 2023-05-16 DIAGNOSIS — I10 Essential (primary) hypertension: Secondary | ICD-10-CM | POA: Diagnosis not present

## 2023-05-16 DIAGNOSIS — G8911 Acute pain due to trauma: Secondary | ICD-10-CM | POA: Diagnosis not present

## 2023-05-16 DIAGNOSIS — I639 Cerebral infarction, unspecified: Secondary | ICD-10-CM | POA: Diagnosis not present

## 2023-05-16 DIAGNOSIS — J9621 Acute and chronic respiratory failure with hypoxia: Secondary | ICD-10-CM | POA: Diagnosis not present

## 2023-05-16 DIAGNOSIS — G893 Neoplasm related pain (acute) (chronic): Secondary | ICD-10-CM | POA: Diagnosis not present

## 2023-05-16 DIAGNOSIS — I2510 Atherosclerotic heart disease of native coronary artery without angina pectoris: Secondary | ICD-10-CM | POA: Diagnosis not present

## 2023-05-16 DIAGNOSIS — J9601 Acute respiratory failure with hypoxia: Secondary | ICD-10-CM | POA: Diagnosis not present

## 2023-05-16 DIAGNOSIS — I2699 Other pulmonary embolism without acute cor pulmonale: Secondary | ICD-10-CM | POA: Diagnosis not present

## 2023-05-16 DIAGNOSIS — I5022 Chronic systolic (congestive) heart failure: Secondary | ICD-10-CM | POA: Diagnosis not present

## 2023-05-17 DIAGNOSIS — I639 Cerebral infarction, unspecified: Secondary | ICD-10-CM | POA: Diagnosis not present

## 2023-05-17 DIAGNOSIS — R5381 Other malaise: Secondary | ICD-10-CM | POA: Diagnosis not present

## 2023-05-17 DIAGNOSIS — R1310 Dysphagia, unspecified: Secondary | ICD-10-CM | POA: Diagnosis not present

## 2023-05-17 DIAGNOSIS — I469 Cardiac arrest, cause unspecified: Secondary | ICD-10-CM | POA: Diagnosis not present

## 2023-05-17 DIAGNOSIS — J9601 Acute respiratory failure with hypoxia: Secondary | ICD-10-CM | POA: Diagnosis not present

## 2023-05-17 DIAGNOSIS — I2699 Other pulmonary embolism without acute cor pulmonale: Secondary | ICD-10-CM | POA: Diagnosis not present

## 2023-05-18 DIAGNOSIS — I2510 Atherosclerotic heart disease of native coronary artery without angina pectoris: Secondary | ICD-10-CM | POA: Diagnosis not present

## 2023-05-18 DIAGNOSIS — G8929 Other chronic pain: Secondary | ICD-10-CM | POA: Diagnosis not present

## 2023-05-18 DIAGNOSIS — J9690 Respiratory failure, unspecified, unspecified whether with hypoxia or hypercapnia: Secondary | ICD-10-CM | POA: Diagnosis not present

## 2023-05-18 DIAGNOSIS — I469 Cardiac arrest, cause unspecified: Secondary | ICD-10-CM | POA: Diagnosis not present

## 2023-05-18 DIAGNOSIS — I10 Essential (primary) hypertension: Secondary | ICD-10-CM | POA: Diagnosis not present

## 2023-05-18 DIAGNOSIS — I2699 Other pulmonary embolism without acute cor pulmonale: Secondary | ICD-10-CM | POA: Diagnosis not present

## 2023-05-18 DIAGNOSIS — I639 Cerebral infarction, unspecified: Secondary | ICD-10-CM | POA: Diagnosis not present

## 2023-05-18 DIAGNOSIS — G8911 Acute pain due to trauma: Secondary | ICD-10-CM | POA: Diagnosis not present

## 2023-05-18 DIAGNOSIS — J9601 Acute respiratory failure with hypoxia: Secondary | ICD-10-CM | POA: Diagnosis not present

## 2023-05-18 DIAGNOSIS — Z9911 Dependence on respirator [ventilator] status: Secondary | ICD-10-CM | POA: Diagnosis not present

## 2023-05-18 DIAGNOSIS — R1310 Dysphagia, unspecified: Secondary | ICD-10-CM | POA: Diagnosis not present

## 2023-05-18 DIAGNOSIS — G893 Neoplasm related pain (acute) (chronic): Secondary | ICD-10-CM | POA: Diagnosis not present

## 2023-05-18 DIAGNOSIS — R5381 Other malaise: Secondary | ICD-10-CM | POA: Diagnosis not present

## 2023-05-19 DIAGNOSIS — R4189 Other symptoms and signs involving cognitive functions and awareness: Secondary | ICD-10-CM | POA: Diagnosis not present

## 2023-05-19 DIAGNOSIS — R1310 Dysphagia, unspecified: Secondary | ICD-10-CM | POA: Diagnosis not present

## 2023-05-19 DIAGNOSIS — R2689 Other abnormalities of gait and mobility: Secondary | ICD-10-CM | POA: Diagnosis not present

## 2023-05-19 DIAGNOSIS — I69398 Other sequelae of cerebral infarction: Secondary | ICD-10-CM | POA: Diagnosis not present

## 2023-05-19 DIAGNOSIS — R5381 Other malaise: Secondary | ICD-10-CM | POA: Diagnosis not present

## 2023-05-19 DIAGNOSIS — I639 Cerebral infarction, unspecified: Secondary | ICD-10-CM | POA: Diagnosis not present

## 2023-05-19 DIAGNOSIS — J188 Other pneumonia, unspecified organism: Secondary | ICD-10-CM | POA: Diagnosis not present

## 2023-05-19 DIAGNOSIS — I2699 Other pulmonary embolism without acute cor pulmonale: Secondary | ICD-10-CM | POA: Diagnosis not present

## 2023-05-19 DIAGNOSIS — J9601 Acute respiratory failure with hypoxia: Secondary | ICD-10-CM | POA: Diagnosis not present

## 2023-05-19 DIAGNOSIS — J9691 Respiratory failure, unspecified with hypoxia: Secondary | ICD-10-CM | POA: Diagnosis not present

## 2023-05-19 DIAGNOSIS — I469 Cardiac arrest, cause unspecified: Secondary | ICD-10-CM | POA: Diagnosis not present

## 2023-05-20 DIAGNOSIS — I10 Essential (primary) hypertension: Secondary | ICD-10-CM | POA: Diagnosis not present

## 2023-05-20 DIAGNOSIS — I639 Cerebral infarction, unspecified: Secondary | ICD-10-CM | POA: Diagnosis not present

## 2023-05-20 DIAGNOSIS — R1310 Dysphagia, unspecified: Secondary | ICD-10-CM | POA: Diagnosis not present

## 2023-05-20 DIAGNOSIS — J9601 Acute respiratory failure with hypoxia: Secondary | ICD-10-CM | POA: Diagnosis not present

## 2023-05-20 DIAGNOSIS — I469 Cardiac arrest, cause unspecified: Secondary | ICD-10-CM | POA: Diagnosis not present

## 2023-05-20 DIAGNOSIS — I2699 Other pulmonary embolism without acute cor pulmonale: Secondary | ICD-10-CM | POA: Diagnosis not present

## 2023-05-20 DIAGNOSIS — J9690 Respiratory failure, unspecified, unspecified whether with hypoxia or hypercapnia: Secondary | ICD-10-CM | POA: Diagnosis not present

## 2023-05-20 DIAGNOSIS — G893 Neoplasm related pain (acute) (chronic): Secondary | ICD-10-CM | POA: Diagnosis not present

## 2023-05-20 DIAGNOSIS — G8911 Acute pain due to trauma: Secondary | ICD-10-CM | POA: Diagnosis not present

## 2023-05-20 DIAGNOSIS — Z9911 Dependence on respirator [ventilator] status: Secondary | ICD-10-CM | POA: Diagnosis not present

## 2023-05-20 DIAGNOSIS — I2510 Atherosclerotic heart disease of native coronary artery without angina pectoris: Secondary | ICD-10-CM | POA: Diagnosis not present

## 2023-05-20 DIAGNOSIS — G8929 Other chronic pain: Secondary | ICD-10-CM | POA: Diagnosis not present

## 2023-05-20 DIAGNOSIS — R5381 Other malaise: Secondary | ICD-10-CM | POA: Diagnosis not present

## 2023-05-21 DIAGNOSIS — I2699 Other pulmonary embolism without acute cor pulmonale: Secondary | ICD-10-CM | POA: Diagnosis not present

## 2023-05-21 DIAGNOSIS — I469 Cardiac arrest, cause unspecified: Secondary | ICD-10-CM | POA: Diagnosis not present

## 2023-05-21 DIAGNOSIS — I639 Cerebral infarction, unspecified: Secondary | ICD-10-CM | POA: Diagnosis not present

## 2023-05-21 DIAGNOSIS — J9601 Acute respiratory failure with hypoxia: Secondary | ICD-10-CM | POA: Diagnosis not present

## 2023-05-21 DIAGNOSIS — R1310 Dysphagia, unspecified: Secondary | ICD-10-CM | POA: Diagnosis not present

## 2023-05-21 DIAGNOSIS — R5381 Other malaise: Secondary | ICD-10-CM | POA: Diagnosis not present

## 2023-05-22 DIAGNOSIS — J9601 Acute respiratory failure with hypoxia: Secondary | ICD-10-CM | POA: Diagnosis not present

## 2023-05-22 DIAGNOSIS — R5381 Other malaise: Secondary | ICD-10-CM | POA: Diagnosis not present

## 2023-05-22 DIAGNOSIS — I639 Cerebral infarction, unspecified: Secondary | ICD-10-CM | POA: Diagnosis not present

## 2023-05-22 DIAGNOSIS — I469 Cardiac arrest, cause unspecified: Secondary | ICD-10-CM | POA: Diagnosis not present

## 2023-05-22 DIAGNOSIS — I2699 Other pulmonary embolism without acute cor pulmonale: Secondary | ICD-10-CM | POA: Diagnosis not present

## 2023-05-22 DIAGNOSIS — R1310 Dysphagia, unspecified: Secondary | ICD-10-CM | POA: Diagnosis not present

## 2023-05-23 DIAGNOSIS — R1310 Dysphagia, unspecified: Secondary | ICD-10-CM

## 2023-05-23 DIAGNOSIS — J9601 Acute respiratory failure with hypoxia: Secondary | ICD-10-CM

## 2023-05-23 DIAGNOSIS — R5381 Other malaise: Secondary | ICD-10-CM

## 2023-05-23 DIAGNOSIS — I2699 Other pulmonary embolism without acute cor pulmonale: Secondary | ICD-10-CM

## 2023-05-23 DIAGNOSIS — I639 Cerebral infarction, unspecified: Secondary | ICD-10-CM

## 2023-05-25 DIAGNOSIS — G893 Neoplasm related pain (acute) (chronic): Secondary | ICD-10-CM | POA: Diagnosis not present

## 2023-05-25 DIAGNOSIS — Z9911 Dependence on respirator [ventilator] status: Secondary | ICD-10-CM | POA: Diagnosis not present

## 2023-05-25 DIAGNOSIS — G8929 Other chronic pain: Secondary | ICD-10-CM | POA: Diagnosis not present

## 2023-05-25 DIAGNOSIS — I2510 Atherosclerotic heart disease of native coronary artery without angina pectoris: Secondary | ICD-10-CM | POA: Diagnosis not present

## 2023-05-25 DIAGNOSIS — J9690 Respiratory failure, unspecified, unspecified whether with hypoxia or hypercapnia: Secondary | ICD-10-CM | POA: Diagnosis not present

## 2023-05-25 DIAGNOSIS — R1312 Dysphagia, oropharyngeal phase: Secondary | ICD-10-CM | POA: Diagnosis not present

## 2023-05-25 DIAGNOSIS — I10 Essential (primary) hypertension: Secondary | ICD-10-CM | POA: Diagnosis not present

## 2023-05-25 DIAGNOSIS — G8911 Acute pain due to trauma: Secondary | ICD-10-CM | POA: Diagnosis not present

## 2023-05-27 DIAGNOSIS — I10 Essential (primary) hypertension: Secondary | ICD-10-CM | POA: Diagnosis not present

## 2023-05-27 DIAGNOSIS — Z9911 Dependence on respirator [ventilator] status: Secondary | ICD-10-CM | POA: Diagnosis not present

## 2023-05-27 DIAGNOSIS — J9690 Respiratory failure, unspecified, unspecified whether with hypoxia or hypercapnia: Secondary | ICD-10-CM | POA: Diagnosis not present

## 2023-05-27 DIAGNOSIS — G8929 Other chronic pain: Secondary | ICD-10-CM | POA: Diagnosis not present

## 2023-05-27 DIAGNOSIS — G8911 Acute pain due to trauma: Secondary | ICD-10-CM | POA: Diagnosis not present

## 2023-05-27 DIAGNOSIS — G893 Neoplasm related pain (acute) (chronic): Secondary | ICD-10-CM | POA: Diagnosis not present

## 2023-05-27 DIAGNOSIS — I2510 Atherosclerotic heart disease of native coronary artery without angina pectoris: Secondary | ICD-10-CM | POA: Diagnosis not present

## 2023-05-28 DIAGNOSIS — I639 Cerebral infarction, unspecified: Secondary | ICD-10-CM

## 2023-05-28 DIAGNOSIS — I2699 Other pulmonary embolism without acute cor pulmonale: Secondary | ICD-10-CM

## 2023-05-28 DIAGNOSIS — R5381 Other malaise: Secondary | ICD-10-CM

## 2023-05-28 DIAGNOSIS — R1310 Dysphagia, unspecified: Secondary | ICD-10-CM

## 2023-05-28 DIAGNOSIS — J9601 Acute respiratory failure with hypoxia: Secondary | ICD-10-CM

## 2023-05-30 DIAGNOSIS — I10 Essential (primary) hypertension: Secondary | ICD-10-CM | POA: Diagnosis not present

## 2023-05-30 DIAGNOSIS — G8929 Other chronic pain: Secondary | ICD-10-CM | POA: Diagnosis not present

## 2023-05-30 DIAGNOSIS — I2510 Atherosclerotic heart disease of native coronary artery without angina pectoris: Secondary | ICD-10-CM | POA: Diagnosis not present

## 2023-05-30 DIAGNOSIS — G893 Neoplasm related pain (acute) (chronic): Secondary | ICD-10-CM | POA: Diagnosis not present

## 2023-05-30 DIAGNOSIS — J9601 Acute respiratory failure with hypoxia: Secondary | ICD-10-CM

## 2023-05-30 DIAGNOSIS — G8911 Acute pain due to trauma: Secondary | ICD-10-CM | POA: Diagnosis not present

## 2023-05-30 DIAGNOSIS — I639 Cerebral infarction, unspecified: Secondary | ICD-10-CM

## 2023-05-30 DIAGNOSIS — R5381 Other malaise: Secondary | ICD-10-CM

## 2023-05-30 DIAGNOSIS — Z9911 Dependence on respirator [ventilator] status: Secondary | ICD-10-CM | POA: Diagnosis not present

## 2023-05-30 DIAGNOSIS — I2699 Other pulmonary embolism without acute cor pulmonale: Secondary | ICD-10-CM

## 2023-05-30 DIAGNOSIS — J9690 Respiratory failure, unspecified, unspecified whether with hypoxia or hypercapnia: Secondary | ICD-10-CM | POA: Diagnosis not present

## 2023-05-30 DIAGNOSIS — R1310 Dysphagia, unspecified: Secondary | ICD-10-CM

## 2023-06-01 DIAGNOSIS — I2510 Atherosclerotic heart disease of native coronary artery without angina pectoris: Secondary | ICD-10-CM | POA: Diagnosis not present

## 2023-06-01 DIAGNOSIS — Z9911 Dependence on respirator [ventilator] status: Secondary | ICD-10-CM | POA: Diagnosis not present

## 2023-06-01 DIAGNOSIS — I10 Essential (primary) hypertension: Secondary | ICD-10-CM | POA: Diagnosis not present

## 2023-06-01 DIAGNOSIS — G8911 Acute pain due to trauma: Secondary | ICD-10-CM | POA: Diagnosis not present

## 2023-06-01 DIAGNOSIS — G8929 Other chronic pain: Secondary | ICD-10-CM | POA: Diagnosis not present

## 2023-06-01 DIAGNOSIS — J9690 Respiratory failure, unspecified, unspecified whether with hypoxia or hypercapnia: Secondary | ICD-10-CM | POA: Diagnosis not present

## 2023-06-01 DIAGNOSIS — G893 Neoplasm related pain (acute) (chronic): Secondary | ICD-10-CM | POA: Diagnosis not present

## 2023-06-03 DIAGNOSIS — Z9911 Dependence on respirator [ventilator] status: Secondary | ICD-10-CM | POA: Diagnosis not present

## 2023-06-03 DIAGNOSIS — I2510 Atherosclerotic heart disease of native coronary artery without angina pectoris: Secondary | ICD-10-CM | POA: Diagnosis not present

## 2023-06-03 DIAGNOSIS — G8929 Other chronic pain: Secondary | ICD-10-CM | POA: Diagnosis not present

## 2023-06-03 DIAGNOSIS — G893 Neoplasm related pain (acute) (chronic): Secondary | ICD-10-CM | POA: Diagnosis not present

## 2023-06-03 DIAGNOSIS — J9690 Respiratory failure, unspecified, unspecified whether with hypoxia or hypercapnia: Secondary | ICD-10-CM | POA: Diagnosis not present

## 2023-06-03 DIAGNOSIS — G8911 Acute pain due to trauma: Secondary | ICD-10-CM | POA: Diagnosis not present

## 2023-06-03 DIAGNOSIS — I10 Essential (primary) hypertension: Secondary | ICD-10-CM | POA: Diagnosis not present

## 2023-06-04 ENCOUNTER — Other Ambulatory Visit: Payer: Self-pay | Admitting: Internal Medicine

## 2023-06-05 DIAGNOSIS — G8911 Acute pain due to trauma: Secondary | ICD-10-CM | POA: Diagnosis not present

## 2023-06-05 DIAGNOSIS — I2510 Atherosclerotic heart disease of native coronary artery without angina pectoris: Secondary | ICD-10-CM | POA: Diagnosis not present

## 2023-06-05 DIAGNOSIS — G893 Neoplasm related pain (acute) (chronic): Secondary | ICD-10-CM | POA: Diagnosis not present

## 2023-06-05 DIAGNOSIS — G8929 Other chronic pain: Secondary | ICD-10-CM | POA: Diagnosis not present

## 2023-06-05 DIAGNOSIS — I10 Essential (primary) hypertension: Secondary | ICD-10-CM | POA: Diagnosis not present

## 2023-06-05 DIAGNOSIS — J9690 Respiratory failure, unspecified, unspecified whether with hypoxia or hypercapnia: Secondary | ICD-10-CM | POA: Diagnosis not present

## 2023-06-05 DIAGNOSIS — Z9911 Dependence on respirator [ventilator] status: Secondary | ICD-10-CM | POA: Diagnosis not present

## 2023-06-06 DIAGNOSIS — J9601 Acute respiratory failure with hypoxia: Secondary | ICD-10-CM

## 2023-06-06 DIAGNOSIS — R5381 Other malaise: Secondary | ICD-10-CM

## 2023-06-06 DIAGNOSIS — I2699 Other pulmonary embolism without acute cor pulmonale: Secondary | ICD-10-CM

## 2023-06-06 DIAGNOSIS — I639 Cerebral infarction, unspecified: Secondary | ICD-10-CM

## 2023-06-06 DIAGNOSIS — R1310 Dysphagia, unspecified: Secondary | ICD-10-CM

## 2023-06-07 DIAGNOSIS — I2699 Other pulmonary embolism without acute cor pulmonale: Secondary | ICD-10-CM | POA: Diagnosis not present

## 2023-06-07 DIAGNOSIS — Z79899 Other long term (current) drug therapy: Secondary | ICD-10-CM | POA: Insufficient documentation

## 2023-06-07 DIAGNOSIS — J9601 Acute respiratory failure with hypoxia: Secondary | ICD-10-CM | POA: Diagnosis not present

## 2023-06-07 DIAGNOSIS — S51801A Unspecified open wound of right forearm, initial encounter: Secondary | ICD-10-CM | POA: Diagnosis not present

## 2023-06-07 DIAGNOSIS — R52 Pain, unspecified: Secondary | ICD-10-CM | POA: Diagnosis not present

## 2023-06-07 DIAGNOSIS — Z950 Presence of cardiac pacemaker: Secondary | ICD-10-CM | POA: Diagnosis present

## 2023-06-07 DIAGNOSIS — M6281 Muscle weakness (generalized): Secondary | ICD-10-CM | POA: Insufficient documentation

## 2023-06-07 DIAGNOSIS — I5023 Acute on chronic systolic (congestive) heart failure: Secondary | ICD-10-CM | POA: Diagnosis not present

## 2023-06-07 DIAGNOSIS — Z8551 Personal history of malignant neoplasm of bladder: Secondary | ICD-10-CM | POA: Insufficient documentation

## 2023-06-07 DIAGNOSIS — R1310 Dysphagia, unspecified: Secondary | ICD-10-CM | POA: Diagnosis not present

## 2023-06-07 DIAGNOSIS — Z743 Need for continuous supervision: Secondary | ICD-10-CM | POA: Diagnosis not present

## 2023-06-07 DIAGNOSIS — N400 Enlarged prostate without lower urinary tract symptoms: Secondary | ICD-10-CM | POA: Diagnosis not present

## 2023-06-07 DIAGNOSIS — M79601 Pain in right arm: Secondary | ICD-10-CM | POA: Diagnosis not present

## 2023-06-07 DIAGNOSIS — R339 Retention of urine, unspecified: Secondary | ICD-10-CM | POA: Insufficient documentation

## 2023-06-07 DIAGNOSIS — R600 Localized edema: Secondary | ICD-10-CM | POA: Diagnosis not present

## 2023-06-07 DIAGNOSIS — I69398 Other sequelae of cerebral infarction: Secondary | ICD-10-CM | POA: Insufficient documentation

## 2023-06-07 DIAGNOSIS — Z8619 Personal history of other infectious and parasitic diseases: Secondary | ICD-10-CM | POA: Insufficient documentation

## 2023-06-07 DIAGNOSIS — J9602 Acute respiratory failure with hypercapnia: Secondary | ICD-10-CM | POA: Diagnosis not present

## 2023-06-07 DIAGNOSIS — E785 Hyperlipidemia, unspecified: Secondary | ICD-10-CM | POA: Diagnosis present

## 2023-06-07 DIAGNOSIS — T82898A Other specified complication of vascular prosthetic devices, implants and grafts, initial encounter: Secondary | ICD-10-CM | POA: Diagnosis not present

## 2023-06-07 DIAGNOSIS — K219 Gastro-esophageal reflux disease without esophagitis: Secondary | ICD-10-CM | POA: Diagnosis present

## 2023-06-07 DIAGNOSIS — E119 Type 2 diabetes mellitus without complications: Secondary | ICD-10-CM | POA: Diagnosis not present

## 2023-06-07 DIAGNOSIS — S51801D Unspecified open wound of right forearm, subsequent encounter: Secondary | ICD-10-CM | POA: Insufficient documentation

## 2023-06-07 DIAGNOSIS — C029 Malignant neoplasm of tongue, unspecified: Secondary | ICD-10-CM | POA: Insufficient documentation

## 2023-06-07 DIAGNOSIS — I2510 Atherosclerotic heart disease of native coronary artery without angina pectoris: Secondary | ICD-10-CM | POA: Insufficient documentation

## 2023-06-07 DIAGNOSIS — Z794 Long term (current) use of insulin: Secondary | ICD-10-CM | POA: Diagnosis not present

## 2023-06-07 DIAGNOSIS — R609 Edema, unspecified: Secondary | ICD-10-CM | POA: Diagnosis not present

## 2023-06-07 DIAGNOSIS — I10 Essential (primary) hypertension: Secondary | ICD-10-CM | POA: Insufficient documentation

## 2023-06-07 DIAGNOSIS — M1990 Unspecified osteoarthritis, unspecified site: Secondary | ICD-10-CM | POA: Insufficient documentation

## 2023-06-07 DIAGNOSIS — C099 Malignant neoplasm of tonsil, unspecified: Secondary | ICD-10-CM | POA: Insufficient documentation

## 2023-06-07 DIAGNOSIS — J9600 Acute respiratory failure, unspecified whether with hypoxia or hypercapnia: Secondary | ICD-10-CM | POA: Diagnosis not present

## 2023-06-07 DIAGNOSIS — Z8674 Personal history of sudden cardiac arrest: Secondary | ICD-10-CM | POA: Insufficient documentation

## 2023-06-07 DIAGNOSIS — I469 Cardiac arrest, cause unspecified: Secondary | ICD-10-CM | POA: Diagnosis not present

## 2023-06-07 DIAGNOSIS — R5381 Other malaise: Secondary | ICD-10-CM | POA: Diagnosis not present

## 2023-06-07 DIAGNOSIS — Z951 Presence of aortocoronary bypass graft: Secondary | ICD-10-CM | POA: Insufficient documentation

## 2023-06-07 DIAGNOSIS — R1312 Dysphagia, oropharyngeal phase: Secondary | ICD-10-CM | POA: Insufficient documentation

## 2023-06-07 DIAGNOSIS — C01 Malignant neoplasm of base of tongue: Secondary | ICD-10-CM | POA: Insufficient documentation

## 2023-06-07 DIAGNOSIS — I639 Cerebral infarction, unspecified: Secondary | ICD-10-CM | POA: Diagnosis not present

## 2023-06-07 DIAGNOSIS — I509 Heart failure, unspecified: Secondary | ICD-10-CM | POA: Diagnosis not present

## 2023-06-07 DIAGNOSIS — S91301D Unspecified open wound, right foot, subsequent encounter: Secondary | ICD-10-CM | POA: Insufficient documentation

## 2023-06-08 DIAGNOSIS — I509 Heart failure, unspecified: Secondary | ICD-10-CM | POA: Diagnosis not present

## 2023-06-08 DIAGNOSIS — I2510 Atherosclerotic heart disease of native coronary artery without angina pectoris: Secondary | ICD-10-CM | POA: Diagnosis not present

## 2023-06-08 DIAGNOSIS — I469 Cardiac arrest, cause unspecified: Secondary | ICD-10-CM | POA: Insufficient documentation

## 2023-06-08 DIAGNOSIS — E785 Hyperlipidemia, unspecified: Secondary | ICD-10-CM | POA: Diagnosis not present

## 2023-06-08 DIAGNOSIS — R1310 Dysphagia, unspecified: Secondary | ICD-10-CM | POA: Diagnosis not present

## 2023-06-08 DIAGNOSIS — I639 Cerebral infarction, unspecified: Secondary | ICD-10-CM | POA: Diagnosis not present

## 2023-06-08 DIAGNOSIS — I2699 Other pulmonary embolism without acute cor pulmonale: Secondary | ICD-10-CM | POA: Diagnosis not present

## 2023-06-08 DIAGNOSIS — J9601 Acute respiratory failure with hypoxia: Secondary | ICD-10-CM | POA: Diagnosis not present

## 2023-06-13 DIAGNOSIS — R600 Localized edema: Secondary | ICD-10-CM | POA: Insufficient documentation

## 2023-06-13 DIAGNOSIS — S51801A Unspecified open wound of right forearm, initial encounter: Secondary | ICD-10-CM | POA: Diagnosis not present

## 2023-06-15 DIAGNOSIS — R600 Localized edema: Secondary | ICD-10-CM | POA: Diagnosis not present

## 2023-06-15 DIAGNOSIS — T82898A Other specified complication of vascular prosthetic devices, implants and grafts, initial encounter: Secondary | ICD-10-CM | POA: Diagnosis not present

## 2023-06-16 DIAGNOSIS — I509 Heart failure, unspecified: Secondary | ICD-10-CM | POA: Diagnosis not present

## 2023-06-16 DIAGNOSIS — R609 Edema, unspecified: Secondary | ICD-10-CM | POA: Diagnosis not present

## 2023-06-23 DIAGNOSIS — I2510 Atherosclerotic heart disease of native coronary artery without angina pectoris: Secondary | ICD-10-CM | POA: Diagnosis not present

## 2023-06-23 DIAGNOSIS — N400 Enlarged prostate without lower urinary tract symptoms: Secondary | ICD-10-CM | POA: Diagnosis not present

## 2023-06-23 DIAGNOSIS — K219 Gastro-esophageal reflux disease without esophagitis: Secondary | ICD-10-CM | POA: Diagnosis not present

## 2023-06-23 DIAGNOSIS — R52 Pain, unspecified: Secondary | ICD-10-CM | POA: Diagnosis not present

## 2023-06-23 DIAGNOSIS — I2699 Other pulmonary embolism without acute cor pulmonale: Secondary | ICD-10-CM | POA: Diagnosis not present

## 2023-06-28 DIAGNOSIS — I252 Old myocardial infarction: Secondary | ICD-10-CM | POA: Diagnosis not present

## 2023-06-28 DIAGNOSIS — J9602 Acute respiratory failure with hypercapnia: Secondary | ICD-10-CM | POA: Diagnosis not present

## 2023-06-28 DIAGNOSIS — I2510 Atherosclerotic heart disease of native coronary artery without angina pectoris: Secondary | ICD-10-CM | POA: Diagnosis not present

## 2023-06-28 DIAGNOSIS — I447 Left bundle-branch block, unspecified: Secondary | ICD-10-CM | POA: Diagnosis not present

## 2023-06-28 DIAGNOSIS — M1990 Unspecified osteoarthritis, unspecified site: Secondary | ICD-10-CM | POA: Diagnosis not present

## 2023-06-28 DIAGNOSIS — Z7901 Long term (current) use of anticoagulants: Secondary | ICD-10-CM | POA: Diagnosis not present

## 2023-06-28 DIAGNOSIS — K219 Gastro-esophageal reflux disease without esophagitis: Secondary | ICD-10-CM | POA: Diagnosis not present

## 2023-06-28 DIAGNOSIS — Z79891 Long term (current) use of opiate analgesic: Secondary | ICD-10-CM | POA: Diagnosis not present

## 2023-06-28 DIAGNOSIS — I5023 Acute on chronic systolic (congestive) heart failure: Secondary | ICD-10-CM | POA: Diagnosis not present

## 2023-06-28 DIAGNOSIS — C099 Malignant neoplasm of tonsil, unspecified: Secondary | ICD-10-CM | POA: Diagnosis not present

## 2023-06-28 DIAGNOSIS — I4510 Unspecified right bundle-branch block: Secondary | ICD-10-CM | POA: Diagnosis not present

## 2023-06-28 DIAGNOSIS — I110 Hypertensive heart disease with heart failure: Secondary | ICD-10-CM | POA: Diagnosis not present

## 2023-06-28 DIAGNOSIS — E875 Hyperkalemia: Secondary | ICD-10-CM | POA: Diagnosis not present

## 2023-06-28 DIAGNOSIS — N401 Enlarged prostate with lower urinary tract symptoms: Secondary | ICD-10-CM | POA: Diagnosis not present

## 2023-06-28 DIAGNOSIS — C01 Malignant neoplasm of base of tongue: Secondary | ICD-10-CM | POA: Diagnosis not present

## 2023-06-28 DIAGNOSIS — R1312 Dysphagia, oropharyngeal phase: Secondary | ICD-10-CM | POA: Diagnosis not present

## 2023-06-28 DIAGNOSIS — E119 Type 2 diabetes mellitus without complications: Secondary | ICD-10-CM | POA: Diagnosis not present

## 2023-06-28 DIAGNOSIS — Z7982 Long term (current) use of aspirin: Secondary | ICD-10-CM | POA: Diagnosis not present

## 2023-06-28 DIAGNOSIS — Z8674 Personal history of sudden cardiac arrest: Secondary | ICD-10-CM | POA: Diagnosis not present

## 2023-06-28 DIAGNOSIS — I2699 Other pulmonary embolism without acute cor pulmonale: Secondary | ICD-10-CM | POA: Diagnosis not present

## 2023-06-28 DIAGNOSIS — E785 Hyperlipidemia, unspecified: Secondary | ICD-10-CM | POA: Diagnosis not present

## 2023-06-28 DIAGNOSIS — T8189XD Other complications of procedures, not elsewhere classified, subsequent encounter: Secondary | ICD-10-CM | POA: Diagnosis not present

## 2023-06-28 DIAGNOSIS — R338 Other retention of urine: Secondary | ICD-10-CM | POA: Diagnosis not present

## 2023-06-28 DIAGNOSIS — Z79899 Other long term (current) drug therapy: Secondary | ICD-10-CM | POA: Diagnosis not present

## 2023-06-28 DIAGNOSIS — I255 Ischemic cardiomyopathy: Secondary | ICD-10-CM | POA: Diagnosis not present

## 2023-07-01 ENCOUNTER — Ambulatory Visit
Admission: EM | Admit: 2023-07-01 | Discharge: 2023-07-01 | Disposition: A | Discharge status: Home / Self Care | Attending: Emergency Medicine | Admitting: Emergency Medicine

## 2023-07-01 DIAGNOSIS — Z87891 Personal history of nicotine dependence: Secondary | ICD-10-CM

## 2023-07-01 DIAGNOSIS — B370 Candidal stomatitis: Secondary | ICD-10-CM | POA: Diagnosis not present

## 2023-07-01 DIAGNOSIS — Z758 Other problems related to medical facilities and other health care: Secondary | ICD-10-CM | POA: Diagnosis not present

## 2023-07-01 MED ORDER — NYSTATIN 100000 UNIT/ML MT SUSP: 5.0000 mL | Freq: Four times a day (QID) | OROMUCOSAL | 0 refills | Status: AC

## 2023-07-01 NOTE — Discharge Instructions (Addendum)
 Please follow up with PCP, we have set up PCP appt-go to appt as scheduled 07/06/2023 Take nystatin  as directed(swish and swallow) Make sure to stay hydrated, drink plenty of water  If you have new or worsening issues or concerns go to ER for further evaluation

## 2023-07-01 NOTE — ED Triage Notes (Signed)
 Pt c/o thrush.  Pt states that he was recently in the hospital for cardiac arrest and has been in therapy. He is given medication when he goes to physical therapy and is unsure if he has been on any antibiotics or medicines that cause thrush.  Pt does not know his medications

## 2023-07-01 NOTE — ED Provider Notes (Signed)
 [facility]    CSN: [id number] Arrival date & time: 07/01/23  1027      History   Chief Complaint Chief Complaint  Patient presents with   Thrush    HPI Cory Hess is a 78 y.o. male.   78 year old male pt, Cory Hess, presents to urgent care for evaluation of "thrush on tongue". Pt is unsure of any meds he may be on and if they have caused issue  Pt has had chemo and treatment for cancer including base of tongue.  Pt states he does not have PCP, has been in hospital for 3 months after cardiac arrest episode  The history is provided by the patient. No language interpreter was used.    Past Medical History:  Diagnosis Date   Alternating RBBB & LBBB    Ankle swelling 2024   Arthritis    neck   Bladder tumor    CAD (coronary artery disease)    a. 08/2017 STEMI/Cath: LM 80ost/d, LAD 100p, LCX 95 ost/p, RCA 60p, 26m, RPDA 80ost; b. 08/2017 CABG x 3: LIMA->LAD, VG->OM, VG->PDA.   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 08/2017 TEE: EF 30-35%, sept/ant/inf HK, apical AK. 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 12/2017 Echo: EF 45-50%, diff HK. Gr1 DD. Mildly reduced RV fxn. Mild RAE; c. 09/2022 Echo: EF 55-60%, mod LVH, GrI DD, nl RV fxn, mildly dil RA, mild MR.   Hyperlipidemia LDL goal <70    Ischemic cardiomyopathy    a. 08/2017 TEE: EF 30-35%; b. 12/2017 Echo: EF 45-50%; c. 09/2022 Echo: EF 55-60%.   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   Acute on chronic HFrEF (heart failure with reduced ejection fraction) (HCC) 04/05/2023   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   Hematuria 11/06/2018   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   Coronary artery disease 08/28/2017   NSTEMI (non-ST elevated myocardial infarction) [facility])     Past Surgical History:  Procedure Laterality Date   CARDIAC CATHETERIZATION     CATARACT EXTRACTION W/PHACO Left 07/18/2018   Procedure: CATARACT EXTRACTION PHACO AND INTRAOCULAR LENS PLACEMENT (IOC)  LEFT;  Surgeon: Rosa College, MD;  Location: [facility];  Service: Ophthalmology;  Laterality: Left;   CATARACT EXTRACTION W/PHACO Right 08/14/2018   Procedure: CATARACT EXTRACTION PHACO AND INTRAOCULAR LENS PLACEMENT (IOC)  RIGHT;  Surgeon: Rosa College, MD;  Location: [facility];  Service: Ophthalmology;  Laterality: Right;   CORONARY ARTERY BYPASS GRAFT N/A 08/27/2017   Procedure: CORONARY ARTERY BYPASS GRAFTING (CABG) ON PUMP USING LEFT INTERNAL MAMMARY ARTERY AND LEFT GREATER SAPHENOUS VEIN VIA ENDOVEIN HARVEST;  Surgeon: Bartley Lightning, MD;  Location: [facility] OR;  Service: Open Heart Surgery;  Laterality: N/A;   CORONARY/GRAFT ACUTE MI REVASCULARIZATION  N/A 08/27/2017   Procedure: Coronary/Graft Acute MI Revascularization;  Surgeon: Wenona Hamilton, MD;  Location: [facility] INVASIVE CV LAB;  Service: Cardiovascular;  Laterality: N/A;   CYSTOSCOPY W/ RETROGRADES Bilateral 12/25/2018   Procedure: CYSTOSCOPY WITH RETROGRADE PYELOGRAM;  Surgeon: Dustin Gimenez, MD;  Location: [facility] ORS;  Service: Urology;  Laterality: Bilateral;   FRACTURE SURGERY Right 1958   arm and left wrist compound fracture, no metal   HERNIA REPAIR Right    inguinial   IR IMAGING GUIDED PORT INSERTION  10/15/2022   LEFT HEART CATH AND CORONARY ANGIOGRAPHY N/A 08/27/2017   Procedure: LEFT HEART CATH AND CORONARY  ANGIOGRAPHY;  Surgeon: Wenona Hamilton, MD;  Location: [facility] INVASIVE CV LAB;  Service: Cardiovascular;  Laterality: N/A;   PULMONARY THROMBECTOMY Bilateral 04/18/2023   Procedure: PULMONARY THROMBECTOMY;  Surgeon: Celso College, MD;  Location: [facility] INVASIVE CV LAB;  Service: Cardiovascular;  Laterality: Bilateral;   RIGHT/LEFT HEART CATH AND CORONARY/GRAFT ANGIOGRAPHY N/A 04/05/2023   Procedure: RIGHT/LEFT HEART CATH AND CORONARY/GRAFT ANGIOGRAPHY;  Surgeon: Sammy Crisp, MD;  Location: [facility] INVASIVE CV LAB;  Service: Cardiovascular;  Laterality: N/A;   TEMPORARY PACEMAKER N/A 04/05/2023   Procedure: TEMPORARY PACEMAKER;  Surgeon: Sammy Crisp, MD;  Location: [facility] INVASIVE CV LAB;  Service: Cardiovascular;  Laterality: N/A;   TRANSURETHRAL RESECTION OF BLADDER TUMOR WITH MITOMYCIN -C N/A 12/25/2018   Procedure: TRANSURETHRAL RESECTION OF BLADDER TUMOR WITH Gemcitabine ;  Surgeon: Dustin Gimenez, MD;  Location: [facility] ORS;  Service: Urology;  Laterality: N/A;       Home Medications    Prior to Admission medications   Medication Sig Start Date End Date Taking? Authorizing Provider  atorvastatin  (LIPITOR ) 80 MG tablet Place 1 tablet (80 mg total) into feeding tube daily. 04/22/23  Yes Cherylann Corpus D, NP  nystatin  (MYCOSTATIN ) 100000 UNIT/ML suspension Take 5 mLs (500,000 Units total) by mouth 4 (four) times daily for 10 days. 07/01/23 07/11/23 Yes Lavanna Rog, NP  acetaminophen  (TYLENOL ) 325 MG tablet Place 2 tablets (650 mg total) into feeding tube every 4 (four) hours as needed for headache or mild pain (pain score 1-3). 04/21/23   Delanna Fears, NP  aspirin  81 MG chewable tablet Place 1 tablet (81 mg total) into feeding tube daily. 04/22/23   Keene, Jeremiah D, NP  Chlorhexidine  Gluconate Cloth 2 % PADS Apply 6 each topically at bedtime. 04/21/23   Delanna Fears, NP  ciprofloxacin  (CIPRO ) 400 MG/200ML SOLN Inject 200 mLs (400 mg total) into the vein every 8 (eight) hours. 04/21/23    Delanna Fears, NP  clopidogrel  (PLAVIX ) 75 MG tablet Place 1 tablet (75 mg total) into feeding tube daily. 04/22/23   Delanna Fears, NP  docusate (COLACE) 50 MG/5ML liquid Place 10 mLs (100 mg total) into feeding tube 2 (two) times daily as needed for moderate constipation. 04/21/23   Keene, Jeremiah D, NP  docusate (COLACE) 50 MG/5ML liquid Place 10 mLs (100 mg total) into feeding tube 2 (two) times daily. 04/21/23   Keene, Jeremiah D, NP  enoxaparin  (LOVENOX ) 100 MG/ML injection Inject 1 mL (100 mg total) into the skin 2 (two) times daily. 04/21/23   Delanna Fears, NP  famotidine  (PEPCID ) 20 MG tablet Place 1 tablet (20 mg total) into feeding tube daily. 04/22/23   Delanna Fears, NP  fentaNYL  (SUBLIMAZE ) SOLN Inject 25-100 mcg into the vein every 15 (fifteen) minutes as needed (to maintain RASS & CPOT goal.). 04/21/23   Delanna Fears, NP  fentaNYL  10  mcg/ml SOLN infusion Inject 25-200 mcg/hr into the vein continuous. 04/21/23   Delanna Fears, NP  haloperidol  lactate (HALDOL ) 5 MG/ML injection Inject 0.4 mLs (2 mg total) into the vein every 6 (six) hours as needed. 04/21/23   Delanna Fears, NP  hydrALAZINE  (APRESOLINE ) 20 MG/ML injection Inject 0.5 mLs (10 mg total) into the vein every 2 (two) hours as needed (sbp GREATER THAN 180 mmHg). 04/21/23   Keene, Jeremiah D, NP  insulin  aspart (NOVOLOG ) 100 UNIT/ML injection Inject 0-6 Units into the skin every 4 (four) hours. 04/21/23   Keene, Jeremiah D, NP  ipratropium-albuterol  (DUONEB) 0.5-2.5 (3) MG/3ML SOLN Take 3 mLs by nebulization every 6 (six) hours as needed. 04/21/23   Cherylann Corpus D, NP  lidocaine  (LIDODERM ) 5 % Place 1 patch onto the skin daily. Remove & Discard patch within 12 hours or as directed by MD 04/21/23   Delanna Fears, NP  midazolam  (VERSED ) 2 MG/2ML SOLN injection Inject 1-2 mLs (1-2 mg total) into the vein every hour as needed for sedation (to maintain RASS goal.). 04/21/23   Keene, Jeremiah D, NP   Mouthwashes (MOUTH RINSE) LIQD solution 15 mLs by Mouth Rinse route every 2 (two) hours. 04/21/23   Delanna Fears, NP  Nutritional Supplements (FEEDING SUPPLEMENT, VITAL HIGH PROTEIN,) LIQD liquid Place 1,000 mLs into feeding tube continuous. 04/21/23   Delanna Fears, NP  ondansetron  (ZOFRAN ) 4 MG/2ML SOLN injection Inject 2 mLs (4 mg total) into the vein every 6 (six) hours as needed for nausea. 04/21/23   Keene, Jeremiah D, NP  polyethylene glycol (MIRALAX  / GLYCOLAX ) 17 g packet Place 17 g into feeding tube daily as needed for mild constipation. 04/21/23   Keene, Jeremiah D, NP  polyethylene glycol (MIRALAX  / GLYCOLAX ) 17 g packet Place 17 g into feeding tube daily. 04/22/23   Delanna Fears, NP  propofol  (DIPRIVAN ) 1000 MG/100ML EMUL injection Inject 513-5,130 mcg/min into the vein continuous. 04/21/23   Keene, Jeremiah D, NP  sodium chloride  flush (NS) 0.9 % SOLN 10-40 mLs by Intracatheter route every 12 (twelve) hours. 04/21/23   Keene, Jeremiah D, NP  sodium chloride  flush (NS) 0.9 % SOLN 10-40 mLs by Intracatheter route as needed (flush). 04/21/23   Keene, Jeremiah D, NP  sodium chloride  flush (NS) 0.9 % SOLN 10-40 mLs by Intracatheter route every 12 (twelve) hours. 04/21/23   Keene, Jeremiah D, NP  sodium chloride  flush (NS) 0.9 % SOLN 10-40 mLs by Intracatheter route as needed (flush). 04/21/23   Keene, Jeremiah D, NP  Water  For Irrigation, Sterile (FREE WATER ) SOLN Place 200 mLs into feeding tube every 4 (four) hours. 04/21/23   Delanna Fears, NP    Family History Family History  Problem Relation Age of Onset   Heart failure Mother    Lung disease Father    Stroke Father    Cervical cancer Maternal Grandmother     Social History Social History   Tobacco Use   Smoking status: Former    Current packs/day: 0.00    Types: Cigarettes    Quit date: 2000    Years since quitting: 25.3   Smokeless tobacco: Current    Types: Snuff  Vaping Use   Vaping status: Never Used   Substance Use Topics   Alcohol use: Not Currently   Drug use: Not Currently     Allergies   Patient has no known allergies.   Review of Systems Review of Systems  Constitutional:  Negative for  fever.  HENT:  Positive for sore throat.   All other systems reviewed and are negative.    Physical Exam Triage Vital Signs ED Triage Vitals [07/01/23 0941]  Encounter Vitals Group     BP      Systolic BP Percentile      Diastolic BP Percentile      Pulse      Resp      Temp      Temp src      SpO2      Weight 212 lb (96.2 kg)     Height 6' (1.829 m)     Head Circumference      Peak Flow      Pain Score 0     Pain Loc      Pain Education      Exclude from Growth Chart    No data found.  Updated Vital Signs BP 101/61 (BP Location: Left Arm)   Pulse 64   Temp 98.1 F (36.7 C) (Oral)   Ht 6' (1.829 m)   Wt 212 lb (96.2 kg)   SpO2 95%   BMI 28.75 kg/m   Visual Acuity Right Eye Distance:   Left Eye Distance:   Bilateral Distance:    Right Eye Near:   Left Eye Near:    Bilateral Near:     Physical Exam Vitals and nursing note reviewed.  Constitutional:      Appearance: Normal appearance. He is well-developed and well-groomed.  HENT:     Head: Normocephalic.     Mouth/Throat:     Lips: Pink.     Mouth: Mucous membranes are dry.     Tongue: No lesions. Tongue does not deviate from midline.     Comments: Pt has blackish brown white areas to tongue and throat Cardiovascular:     Rate and Rhythm: Normal rate.  Pulmonary:     Effort: Pulmonary effort is normal.  Skin:    General: Skin is warm.     Capillary Refill: Capillary refill takes less than 2 seconds.     Comments: Pt has dressing to right inner AC  Neurological:     General: No focal deficit present.     Mental Status: He is alert and oriented to person, place, and time.     GCS: GCS eye subscore is 4. GCS verbal subscore is 5. GCS motor subscore is 6.  Psychiatric:        Attention and  Perception: Attention normal.        Mood and Affect: Mood normal.        Speech: Speech normal.        Behavior: Behavior normal. Behavior is cooperative.     UC Treatments / Results  Labs (all labs ordered are listed, but only abnormal results are displayed) Labs Reviewed - No data to display  EKG   Radiology No results found.  Procedures Procedures (including critical care time)  Medications Ordered in UC Medications - No data to display  Initial Impression / Assessment and Plan / UC Course  I have reviewed the triage vital signs and the nursing notes.  Pertinent labs & imaging results that were available during my care of the patient were reviewed by me and considered in my medical decision making (see chart for details).    Discussed exam findings and plan of care with patient, PCP appointment set up, go to ER precautions given, patient verbalized understanding to this provider  Ddx: Does not  have PCP, Thrush, former smoker Final Clinical Impressions(s) / UC Diagnoses   Final diagnoses:  Does not have primary care provider  Cory Hess  Former smoker     Discharge Instructions      Please follow up with PCP, we have set up PCP appt-go to appt as scheduled 07/06/2023 Take nystatin  as directed(swish and swallow) Make sure to stay hydrated, drink plenty of water  If you have new or worsening issues or concerns go to ER for further evaluation    ED Prescriptions     Medication Sig Dispense Auth. Provider   nystatin  (MYCOSTATIN ) 100000 UNIT/ML suspension Take 5 mLs (500,000 Units total) by mouth 4 (four) times daily for 10 days. 200 mL Ezma Rehm, Eveleen Hinds, NP      PDMP not reviewed this encounter.   Peter Brands, NP 07/01/23 2129

## 2023-07-02 ENCOUNTER — Other Ambulatory Visit: Payer: Self-pay

## 2023-07-05 DIAGNOSIS — K219 Gastro-esophageal reflux disease without esophagitis: Secondary | ICD-10-CM | POA: Diagnosis not present

## 2023-07-05 DIAGNOSIS — M1990 Unspecified osteoarthritis, unspecified site: Secondary | ICD-10-CM | POA: Diagnosis not present

## 2023-07-05 DIAGNOSIS — I110 Hypertensive heart disease with heart failure: Secondary | ICD-10-CM | POA: Diagnosis not present

## 2023-07-05 DIAGNOSIS — T8189XD Other complications of procedures, not elsewhere classified, subsequent encounter: Secondary | ICD-10-CM | POA: Diagnosis not present

## 2023-07-05 DIAGNOSIS — J9602 Acute respiratory failure with hypercapnia: Secondary | ICD-10-CM | POA: Diagnosis not present

## 2023-07-05 DIAGNOSIS — I5023 Acute on chronic systolic (congestive) heart failure: Secondary | ICD-10-CM | POA: Diagnosis not present

## 2023-07-06 ENCOUNTER — Ambulatory Visit (INDEPENDENT_AMBULATORY_CARE_PROVIDER_SITE_OTHER): Admitting: Family Medicine

## 2023-07-06 ENCOUNTER — Encounter: Payer: Self-pay | Admitting: Family Medicine

## 2023-07-06 VITALS — BP 130/69 | HR 60 | Temp 97.6°F | Resp 18 | Ht 72.0 in | Wt 211.0 lb

## 2023-07-06 DIAGNOSIS — I69319 Unspecified symptoms and signs involving cognitive functions following cerebral infarction: Secondary | ICD-10-CM

## 2023-07-06 DIAGNOSIS — I2510 Atherosclerotic heart disease of native coronary artery without angina pectoris: Secondary | ICD-10-CM

## 2023-07-06 DIAGNOSIS — Z7689 Persons encountering health services in other specified circumstances: Secondary | ICD-10-CM | POA: Diagnosis not present

## 2023-07-06 DIAGNOSIS — Z8674 Personal history of sudden cardiac arrest: Secondary | ICD-10-CM

## 2023-07-06 DIAGNOSIS — N179 Acute kidney failure, unspecified: Secondary | ICD-10-CM | POA: Diagnosis not present

## 2023-07-06 DIAGNOSIS — I469 Cardiac arrest, cause unspecified: Secondary | ICD-10-CM | POA: Diagnosis not present

## 2023-07-06 DIAGNOSIS — I10 Essential (primary) hypertension: Secondary | ICD-10-CM | POA: Diagnosis not present

## 2023-07-06 DIAGNOSIS — T148XXD Other injury of unspecified body region, subsequent encounter: Secondary | ICD-10-CM

## 2023-07-06 DIAGNOSIS — C01 Malignant neoplasm of base of tongue: Secondary | ICD-10-CM | POA: Diagnosis not present

## 2023-07-06 DIAGNOSIS — R739 Hyperglycemia, unspecified: Secondary | ICD-10-CM

## 2023-07-06 DIAGNOSIS — C770 Secondary and unspecified malignant neoplasm of lymph nodes of head, face and neck: Secondary | ICD-10-CM | POA: Diagnosis not present

## 2023-07-06 LAB — HEMOGLOBIN A1C
Est. average glucose Bld gHb Est-mCnc: 103 mg/dL
Hgb A1c MFr Bld: 5.2 % (ref 4.8–5.6)

## 2023-07-06 NOTE — Patient Instructions (Signed)
 MyChart:  For all urgent or time sensitive needs we ask that you please call the office to avoid delays. Our number is [phone]) [id number]. MyChart is not constantly monitored and due to the large volume of messages a day, replies may take up to 72 business hours.   MyChart Policy: MyChart allows for you to see your visit notes, after visit summary, provider recommendations, lab and tests results, make an appointment, request refills, and contact your provider or the office for non-urgent questions or concerns. Providers are seeing patients during normal business hours and do not have built in time to review MyChart messages.  We ask that you allow a minimum of 3 business days for responses to [employer]. For this reason, please do not send urgent requests through MyChart. Please call the office at [phone]. New and ongoing conditions may require a visit. We have virtual and in person visit available for your convenience.  Complex MyChart concerns may require a visit. Your provider may request you schedule a virtual or in person visit to ensure we are providing the best care possible. MyChart messages sent after 11:00 AM on Friday will not be received by the provider until Monday morning.    Lab and Test Results: You will receive your lab and test results on MyChart as soon as they are completed and results have been sent by the lab or testing facility. Due to this service, you will receive your results BEFORE your provider.  I review lab and tests results each morning prior to seeing patients. Some results require collaboration with other providers to ensure you are receiving the most appropriate care. For this reason, we ask that you please allow a minimum of 3-5 business days from the time the ALL results have been received for your provider to receive and review lab and test results and contact you about these.  Most lab and test result comments from the provider will be sent through MyChart.  Your provider may recommend changes to the plan of care, follow-up visits, repeat testing, ask questions, or request an office visit to discuss these results. You may reply directly to this message or call the office at [phone] to provide information for the provider or set up an appointment. In some instances, you will be called with test results and recommendations. Please let us know if this is preferred and we will make note of this in your chart to provide this for you.    If you have not heard a response to your lab or test results in 5 business days from all results returning to MyChart, please call the office to let us know. We ask that you please avoid calling prior to this time unless there is an emergent concern. Due to high call volumes, this can delay the resulting process.   After Hours: For all non-emergency after hours needs, please call the office at [phone] and select the option to reach the on-call provider service. On-call services are shared between multiple [facility] offices and therefore it will not be possible to speak directly with your provider. On-call providers may provide medical advice and recommendations, but are unable to provide refills for maintenance medications.  For all emergency or urgent medical needs after normal business hours, we recommend that you seek care at the closest Urgent Care or Emergency Department to ensure appropriate treatment in a timely manner.  [facility] has a 24 hour emergency room located on the ground floor for your  convenience.    Urgent Concerns During the Business Day Providers are seeing patients from 8AM to 5PM, Monday through Thursday, and 8AM to 12PM on Friday with a busy schedule and are most often not able to respond to non-urgent calls until the end of the day or the next business day. If you should have URGENT concerns during the day, please call and speak to the nurse or schedule a same day  appointment so that we can address your concern without delay.    Thank you, again, for choosing me as your health care partner. I appreciate your trust and look forward to learning more about you.    Alyson Reedy, FNP-C

## 2023-07-06 NOTE — Progress Notes (Unsigned)
 New Patient Office Visit  Subjective   Patient ID: Dristin Degon, male    DOB: 1945-05-07  Age: 78 y.o. MRN: [id number]  CC:  Chief Complaint  Patient presents with   Establish Care   Cardiac Arrest   Arm Swelling    Right hand    HPI Sevastian Zahra is a 78 year old male who presents to establish with [facility] at [facility].   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 He presented to [facility] on 04/05/23 with concerns for ongoing UTI symptoms and went into cardiac arrest while in the waiting room. He was transferred to [facility] with a downtime for 2 hours from out-of-hospital cardiac arrested and inpatient cardiac arrest. Taken emergently to the Cath Lab with findings of significant coronary artery disease however not of which appeared acute. Therefore no PCI was done. He was transferred to the ICU for ongoing medical care. Found to have Acute Decompensated HFrEF, shock (cardiogenic + septic), Acute Hypoxic & Hypercapnic Respiratory Failure requiring intubation and mechanical ventilation, but was successfully extubated.  Course complicated by development of Pseudomonas Pneumonia and Pulmonary Embolism requiring reintubation and Thrombectomy with Vascular Surgery. With failure to wean from ventilator requiring transfer to [facility], likely needing tracheostomy. Patient went to [facility] for a total of about 2.5 weeks and reports that he has been home for about one week. He has been having home healthcare check his R arm wound dressing (d/t IV infiltration) and has concerns about where his PEG tube was removed.  He states the current medication he is taking includes asa  81mg  qd, lipitor  80mg  daily, Cozaar  100mg  daily & Coreg  12.5mg  qd.    Oncology- Dr. Jacalyn Martin & Dr. Valentine Gasmen  He went 2 rounds of chemo- finished in Oct 2024  Radiation  rounds in the 30s  Has not seen oncology since being hospitalized  Still has a port but reports that he has not had it removed   Outpatient Encounter Medications as of 07/06/2023  Medication Sig   acetaminophen  (TYLENOL ) 325 MG tablet Place 2 tablets (650 mg total) into feeding tube every 4 (four) hours as needed for headache or mild pain (pain score 1-3).   aspirin  81 MG chewable tablet Place 1 tablet (81 mg total) into feeding tube daily.   atorvastatin  (LIPITOR ) 80 MG tablet Place 1 tablet (80 mg total) into feeding tube daily.   Chlorhexidine  Gluconate Cloth 2 % PADS Apply 6 each topically at bedtime.   docusate (COLACE) 50 MG/5ML liquid Place 10 mLs (100 mg total) into feeding tube 2 (two) times daily.   doxazosin (CARDURA) 2 MG tablet Take 2 mg by mouth daily.   ELIQUIS 5 MG TABS tablet Take 5 mg by mouth 2 (two) times daily.   famotidine  (PEPCID ) 20 MG tablet Place 1 tablet (20 mg total) into feeding tube daily.   fentaNYL  (SUBLIMAZE ) SOLN Inject 25-100 mcg into the vein every 15 (fifteen) minutes as needed (to maintain RASS & CPOT goal.).   Mouthwashes (MOUTH RINSE) LIQD solution 15 mLs by Mouth Rinse route every 2 (two) hours.   nystatin  (MYCOSTATIN ) 100000 UNIT/ML suspension Take 5 mLs (500,000 Units total) by mouth 4 (four) times daily for 10 days.  ciprofloxacin  (CIPRO ) 400 MG/200ML SOLN Inject 200 mLs (400 mg total) into the vein every 8 (eight) hours. (Patient not taking: Reported on 07/06/2023)   clopidogrel  (PLAVIX ) 75 MG tablet Place 1 tablet (75 mg total) into feeding tube daily. (Patient not taking: Reported on 07/06/2023)   docusate (COLACE) 50 MG/5ML liquid Place 10 mLs (100 mg total) into feeding tube 2 (two) times daily as needed for moderate constipation. (Patient not taking: Reported on 07/06/2023)    enoxaparin  (LOVENOX ) 100 MG/ML injection Inject 1 mL (100 mg total) into the skin 2 (two) times daily. (Patient not taking: Reported on 07/06/2023)   fentaNYL  10 mcg/ml SOLN infusion Inject 25-200 mcg/hr into the vein continuous. (Patient not taking: Reported on 07/06/2023)   haloperidol  lactate (HALDOL ) 5 MG/ML injection Inject 0.4 mLs (2 mg total) into the vein every 6 (six) hours as needed. (Patient not taking: Reported on 07/06/2023)   hydrALAZINE  (APRESOLINE ) 20 MG/ML injection Inject 0.5 mLs (10 mg total) into the vein every 2 (two) hours as needed (sbp GREATER THAN 180 mmHg). (Patient not taking: Reported on 07/06/2023)   insulin  aspart (NOVOLOG ) 100 UNIT/ML injection Inject 0-6 Units into the skin every 4 (four) hours. (Patient not taking: Reported on 07/06/2023)   ipratropium-albuterol  (DUONEB) 0.5-2.5 (3) MG/3ML SOLN Take 3 mLs by nebulization every 6 (six) hours as needed. (Patient not taking: Reported on 07/06/2023)   lidocaine  (LIDODERM ) 5 % Place 1 patch onto the skin daily. Remove & Discard patch within 12 hours or as directed by MD (Patient not taking: Reported on 07/06/2023)   midazolam  (VERSED ) 2 MG/2ML SOLN injection Inject 1-2 mLs (1-2 mg total) into the vein every hour as needed for sedation (to maintain RASS goal.). (Patient not taking: Reported on 07/06/2023)   Nutritional Supplements (FEEDING SUPPLEMENT, VITAL HIGH PROTEIN,) LIQD liquid Place 1,000 mLs into feeding tube continuous. (Patient not taking: Reported on 07/06/2023)   ondansetron  (ZOFRAN ) 4 MG/2ML SOLN injection Inject 2 mLs (4 mg total) into the vein every 6 (six) hours as needed for nausea. (Patient not taking: Reported on 07/06/2023)   polyethylene glycol (MIRALAX  / GLYCOLAX ) 17 g packet Place 17 g into feeding tube daily as needed for mild constipation. (Patient not taking: Reported on 07/06/2023)   polyethylene glycol (MIRALAX  / GLYCOLAX ) 17 g packet Place 17 g into feeding tube daily. (Patient not taking: Reported on  07/06/2023)   propofol  (DIPRIVAN ) 1000 MG/100ML EMUL injection Inject 513-5,130 mcg/min into the vein continuous. (Patient not taking: Reported on 07/06/2023)   sodium chloride  flush (NS) 0.9 % SOLN 10-40 mLs by Intracatheter route every 12 (twelve) hours. (Patient not taking: Reported on 07/06/2023)   sodium chloride  flush (NS) 0.9 % SOLN 10-40 mLs by Intracatheter route as needed (flush). (Patient not taking: Reported on 07/06/2023)   sodium chloride  flush (NS) 0.9 % SOLN 10-40 mLs by Intracatheter route every 12 (twelve) hours. (Patient not taking: Reported on 07/06/2023)   sodium chloride  flush (NS) 0.9 % SOLN 10-40 mLs by Intracatheter route as needed (flush). (Patient not taking: Reported on 07/06/2023)   Water  For Irrigation, Sterile (FREE WATER ) SOLN Place 200 mLs into feeding tube every 4 (four) hours. (Patient not taking: Reported on 07/06/2023)   Facility-Administered Encounter Medications as of 07/06/2023  Medication   sodium chloride  flush (NS) 0.9 % injection 10 mL   sodium chloride  flush (NS) 0.9 % injection 10 mL    Patient Active Problem List   Diagnosis Date Noted   Blood glucose elevated 07/06/2023   Does  not have primary care provider 07/01/2023   Thrush 07/01/2023   Former smoker 07/01/2023   Pulmonary embolus (HCC) 04/15/2023   Acute respiratory failure with hypoxia and hypercapnia (HCC) 04/10/2023   Acute CVA (cerebrovascular accident) (HCC) 04/09/2023   Hyperkalemia 04/06/2023   Acute kidney injury (HCC) 04/06/2023   Cardiac arrest (HCC) 04/05/2023   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   Acute on chronic HFrEF (heart failure with reduced ejection fraction) (HCC) 04/05/2023   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   Hematuria 11/06/2018   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   Coronary artery disease 08/28/2017   NSTEMI (non-ST elevated myocardial infarction) [facility])    Past Medical History:  Diagnosis Date   Alternating RBBB & LBBB    Ankle swelling 2024   Arthritis     neck   Bladder tumor    CAD (coronary artery disease)    a. 08/2017 STEMI/Cath: LM 80ost/d, LAD 100p, LCX 95 ost/p, RCA 60p, 73m, RPDA 80ost; b. 08/2017 CABG x 3: LIMA->LAD, VG->OM, VG->PDA.   Cancer [facility])    bladder tumor   Cancer of base of tongue (HCC)    Essential hypertension    HFimpEF (heart failure with improved ejection fraction) (HCC)    a. 08/2017 TEE: EF 30-35%, sept/ant/inf HK, apical AK. 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 12/2017 Echo: EF 45-50%, diff HK. Gr1 DD. Mildly reduced RV fxn. Mild RAE; c. 09/2022 Echo: EF 55-60%, mod LVH, GrI DD, nl RV fxn, mildly dil RA, mild MR.   Hyperlipidemia LDL goal <70    Ischemic cardiomyopathy    a. 08/2017 TEE: EF 30-35%; b. 12/2017 Echo: EF 45-50%; c. 09/2022 Echo: EF 55-60%.   Myocardial infarction (HCC) 08/27/2017   Shingles    2018 - right side   Vertigo    several months ago   Past Surgical History:  Procedure Laterality Date   CARDIAC CATHETERIZATION     CATARACT EXTRACTION W/PHACO Left 07/18/2018   Procedure: CATARACT EXTRACTION PHACO AND INTRAOCULAR LENS PLACEMENT (IOC)  LEFT;  Surgeon: Rosa College, MD;  Location: [facility];  Service: Ophthalmology;  Laterality: Left;   CATARACT EXTRACTION W/PHACO Right 08/14/2018   Procedure: CATARACT EXTRACTION PHACO AND INTRAOCULAR LENS PLACEMENT (IOC)  RIGHT;  Surgeon: Rosa College, MD;  Location: [facility];  Service: Ophthalmology;  Laterality: Right;   CORONARY ARTERY BYPASS GRAFT N/A 08/27/2017   Procedure: CORONARY ARTERY BYPASS GRAFTING (CABG) ON PUMP USING LEFT INTERNAL MAMMARY ARTERY AND LEFT GREATER SAPHENOUS VEIN VIA ENDOVEIN HARVEST;  Surgeon: Bartley Lightning, MD;  Location: [facility] OR;  Service: Open Heart Surgery;  Laterality: N/A;   CORONARY/GRAFT ACUTE MI REVASCULARIZATION N/A 08/27/2017   Procedure: Coronary/Graft Acute MI Revascularization;  Surgeon: Wenona Hamilton, MD;  Location: [facility];  Service: Cardiovascular;  Laterality: N/A;   CYSTOSCOPY W/  RETROGRADES Bilateral 12/25/2018   Procedure: CYSTOSCOPY WITH RETROGRADE PYELOGRAM;  Surgeon: Dustin Gimenez, MD;  Location: [facility] ORS;  Service: Urology;  Laterality: Bilateral;   FRACTURE SURGERY Right 1958   arm and left wrist compound fracture, no metal   HERNIA REPAIR Right    inguinial   IR IMAGING GUIDED PORT INSERTION  10/15/2022   LEFT HEART CATH AND CORONARY ANGIOGRAPHY N/A 08/27/2017   Procedure: LEFT HEART CATH AND CORONARY ANGIOGRAPHY;  Surgeon: Wenona Hamilton, MD;  Location: [facility] CV [facility];  Service: Cardiovascular;  Laterality: N/A;   PULMONARY THROMBECTOMY Bilateral 04/18/2023   Procedure: PULMONARY THROMBECTOMY;  Surgeon: Vonna Guardian,  Donald Frost, MD;  Location: [facility];  Service: Cardiovascular;  Laterality: Bilateral;   RIGHT/LEFT HEART CATH AND CORONARY/GRAFT ANGIOGRAPHY N/A 04/05/2023   Procedure: RIGHT/LEFT HEART CATH AND CORONARY/GRAFT ANGIOGRAPHY;  Surgeon: Sammy Crisp, MD;  Location: [facility];  Service: Cardiovascular;  Laterality: N/A;   TEMPORARY PACEMAKER N/A 04/05/2023   Procedure: TEMPORARY PACEMAKER;  Surgeon: Sammy Crisp, MD;  Location: [facility];  Service: Cardiovascular;  Laterality: N/A;   TRANSURETHRAL RESECTION OF BLADDER TUMOR WITH MITOMYCIN -C N/A 12/25/2018   Procedure: TRANSURETHRAL RESECTION OF BLADDER TUMOR WITH Gemcitabine ;  Surgeon: Dustin Gimenez, MD;  Location: [facility] ORS;  Service: Urology;  Laterality: N/A;   Family History  Problem Relation Age of Onset   Heart failure Mother    Lung disease Father    Stroke Father    Cervical cancer Maternal Grandmother    Social History   Socioeconomic History   Marital status: Single    Spouse name: Not on file   Number of children: Not on file   Years of education: Not on file   Highest education level: Not on file  Occupational History   Occupation: supervision, Research scientist (life sciences)    Comment: retired  Tobacco Use   Smoking status: Former    Current  packs/day: 0.00    Types: Cigarettes    Quit date: 2000    Years since quitting: 25.3   Smokeless tobacco: Current    Types: Snuff  Vaping Use   Vaping status: Never Used  Substance and Sexual Activity   Alcohol use: Not Currently   Drug use: Not Currently   Sexual activity: Not on file  Other Topics Concern   Not on file  Social History Narrative   Lives alone   Social Drivers of Health   Financial Resource Strain: Low Risk  (11/06/2018)   Overall Financial Resource Strain (CARDIA)    Difficulty of Paying Living Expenses: Not hard at all  Food Insecurity: Patient Unable To Answer (04/06/2023)   Hunger Vital Sign    Worried About Running Out of Food in the Last Year: Patient unable to answer    Ran Out of Food in the Last Year: Patient unable to answer  Transportation Needs: Patient Unable To Answer (04/06/2023)   PRAPARE - Transportation    Lack of Transportation (Medical): Patient unable to answer    Lack of Transportation (Non-Medical): Patient unable to answer  Physical Activity: Inactive (11/06/2018)   Exercise Vital Sign    Days of Exercise per Week: 0 days    Minutes of Exercise per Session: 0 min  Stress: No Stress Concern Present (11/06/2018)   [employer] of Occupational Health - Occupational Stress Questionnaire    Feeling of Stress : Not at all  Social Connections: Patient Unable To Answer (04/06/2023)   Social Connection and Isolation Panel [NHANES]    Frequency of Communication with Friends and Family: Patient unable to answer    Frequency of Social Gatherings with Friends and Family: Patient unable to answer    Attends Religious Services: Patient unable to answer    Active Member of Clubs or Organizations: Patient unable to answer    Attends Banker Meetings: Patient unable to answer    Marital Status: Patient unable to answer  Intimate Partner Violence: Unknown (04/06/2023)   Humiliation, Afraid, Rape, and Kick questionnaire    Fear of  Current or Ex-Partner: Patient unable to answer    Emotionally Abused: Patient unable to answer  Physically Abused: No    Sexually Abused: Patient unable to answer   Outpatient Medications Prior to Visit  Medication Sig Dispense Refill   acetaminophen  (TYLENOL ) 325 MG tablet Place 2 tablets (650 mg total) into feeding tube every 4 (four) hours as needed for headache or mild pain (pain score 1-3).     aspirin  81 MG chewable tablet Place 1 tablet (81 mg total) into feeding tube daily.     atorvastatin  (LIPITOR ) 80 MG tablet Place 1 tablet (80 mg total) into feeding tube daily.     Chlorhexidine  Gluconate Cloth 2 % PADS Apply 6 each topically at bedtime.     docusate (COLACE) 50 MG/5ML liquid Place 10 mLs (100 mg total) into feeding tube 2 (two) times daily.     doxazosin (CARDURA) 2 MG tablet Take 2 mg by mouth daily.     ELIQUIS 5 MG TABS tablet Take 5 mg by mouth 2 (two) times daily.     famotidine  (PEPCID ) 20 MG tablet Place 1 tablet (20 mg total) into feeding tube daily.     fentaNYL  (SUBLIMAZE ) SOLN Inject 25-100 mcg into the vein every 15 (fifteen) minutes as needed (to maintain RASS & CPOT goal.).     Mouthwashes (MOUTH RINSE) LIQD solution 15 mLs by Mouth Rinse route every 2 (two) hours.     nystatin  (MYCOSTATIN ) 100000 UNIT/ML suspension Take 5 mLs (500,000 Units total) by mouth 4 (four) times daily for 10 days. 200 mL 0   ciprofloxacin  (CIPRO ) 400 MG/200ML SOLN Inject 200 mLs (400 mg total) into the vein every 8 (eight) hours. (Patient not taking: Reported on 07/06/2023)     clopidogrel  (PLAVIX ) 75 MG tablet Place 1 tablet (75 mg total) into feeding tube daily. (Patient not taking: Reported on 07/06/2023)     docusate (COLACE) 50 MG/5ML liquid Place 10 mLs (100 mg total) into feeding tube 2 (two) times daily as needed for moderate constipation. (Patient not taking: Reported on 07/06/2023)     enoxaparin  (LOVENOX ) 100 MG/ML injection Inject 1 mL (100 mg total) into the skin 2 (two) times  daily. (Patient not taking: Reported on 07/06/2023)     fentaNYL  10 mcg/ml SOLN infusion Inject 25-200 mcg/hr into the vein continuous. (Patient not taking: Reported on 07/06/2023)     haloperidol  lactate (HALDOL ) 5 MG/ML injection Inject 0.4 mLs (2 mg total) into the vein every 6 (six) hours as needed. (Patient not taking: Reported on 07/06/2023)     hydrALAZINE  (APRESOLINE ) 20 MG/ML injection Inject 0.5 mLs (10 mg total) into the vein every 2 (two) hours as needed (sbp GREATER THAN 180 mmHg). (Patient not taking: Reported on 07/06/2023)     insulin  aspart (NOVOLOG ) 100 UNIT/ML injection Inject 0-6 Units into the skin every 4 (four) hours. (Patient not taking: Reported on 07/06/2023)     ipratropium-albuterol  (DUONEB) 0.5-2.5 (3) MG/3ML SOLN Take 3 mLs by nebulization every 6 (six) hours as needed. (Patient not taking: Reported on 07/06/2023)     lidocaine  (LIDODERM ) 5 % Place 1 patch onto the skin daily. Remove & Discard patch within 12 hours or as directed by MD (Patient not taking: Reported on 07/06/2023)     midazolam  (VERSED ) 2 MG/2ML SOLN injection Inject 1-2 mLs (1-2 mg total) into the vein every hour as needed for sedation (to maintain RASS goal.). (Patient not taking: Reported on 07/06/2023)     Nutritional Supplements (FEEDING SUPPLEMENT, VITAL HIGH PROTEIN,) LIQD liquid Place 1,000 mLs into feeding tube continuous. (Patient not taking: Reported on  07/06/2023)     ondansetron  (ZOFRAN ) 4 MG/2ML SOLN injection Inject 2 mLs (4 mg total) into the vein every 6 (six) hours as needed for nausea. (Patient not taking: Reported on 07/06/2023)     polyethylene glycol (MIRALAX  / GLYCOLAX ) 17 g packet Place 17 g into feeding tube daily as needed for mild constipation. (Patient not taking: Reported on 07/06/2023)     polyethylene glycol (MIRALAX  / GLYCOLAX ) 17 g packet Place 17 g into feeding tube daily. (Patient not taking: Reported on 07/06/2023)     propofol  (DIPRIVAN ) 1000 MG/100ML EMUL injection Inject 513-5,130  mcg/min into the vein continuous. (Patient not taking: Reported on 07/06/2023)     sodium chloride  flush (NS) 0.9 % SOLN 10-40 mLs by Intracatheter route every 12 (twelve) hours. (Patient not taking: Reported on 07/06/2023)     sodium chloride  flush (NS) 0.9 % SOLN 10-40 mLs by Intracatheter route as needed (flush). (Patient not taking: Reported on 07/06/2023)     sodium chloride  flush (NS) 0.9 % SOLN 10-40 mLs by Intracatheter route every 12 (twelve) hours. (Patient not taking: Reported on 07/06/2023)     sodium chloride  flush (NS) 0.9 % SOLN 10-40 mLs by Intracatheter route as needed (flush). (Patient not taking: Reported on 07/06/2023)     Water  For Irrigation, Sterile (FREE WATER ) SOLN Place 200 mLs into feeding tube every 4 (four) hours. (Patient not taking: Reported on 07/06/2023)     Facility-Administered Medications Prior to Visit  Medication Dose Route Frequency Provider Last Rate Last Admin   sodium chloride  flush (NS) 0.9 % injection 10 mL  10 mL Intravenous PRN Brahmanday, Govinda R, MD       sodium chloride  flush (NS) 0.9 % injection 10 mL  10 mL Intravenous PRN Brahmanday, Govinda R, MD   10 mL at 11/30/22 1510   No Known Allergies   ROS: see HPI   Objective  Today's Vitals   07/06/23 1409  BP: 130/69  Pulse: 60  Resp: 18  Temp: 97.6 F (36.4 C)  TempSrc: Oral  SpO2: 95%  Weight: 211 lb (95.7 kg)  Height: 6' (1.829 m)  PainSc: 4   PainLoc: Abdomen   Physical Exam Constitutional:      Appearance: Normal appearance.  Neck:     Vascular: No carotid bruit.     Trachea: Trachea normal.  Cardiovascular:     Rate and Rhythm: Normal rate and regular rhythm.     Pulses: Normal pulses.          Radial pulses are 2+ on the right side and 2+ on the left side.     Heart sounds: Normal heart sounds, S1 normal and S2 normal.  Pulmonary:     Effort: Pulmonary effort is normal.     Breath sounds: Normal breath sounds.  Chest:     Comments: R chest port present  Musculoskeletal:      Right upper arm: Swelling present.     Right hand: Swelling present.     Comments: R elbow to R hand edematous d/t IV infiltration while hospitalized- 4x4 bandage covering R arm wound   Skin:    Findings: Wound present.          Comments: Lower anterior neck: well-healed incision from old tracheostomy  R arm: wound from IV infiltration  L upper abdomen: wound 0.5cm in size from old PEG tube, erythematous with sanguineous drainage with crusting (old discharge) present around site.   Neurological:     Mental Status: He is  alert.  Psychiatric:        Mood and Affect: Mood normal.        Behavior: Behavior normal.        Assessment & Plan:   1. Encounter to establish care (Primary) Patient is a 70- year-old male who presents today to establish care with primary care at [facility] and is a hospital follow-up. Reviewed the past medical history, family history, social history, surgical history, medications and allergies today- updates made as indicated. Patient has concerns today about his unhealed PEG tube site on his L upper abdomen.    2. Coronary artery disease involving native coronary artery of native heart without angina pectoris Review of chart-patient had STEMI 08/2017 and CABG x 3.  Continue ASA 81 mg daily and atorvastatin  80 mg daily. - CBC with Differential/Platelet - Comprehensive metabolic panel with GFR - TSH Rfx on Abnormal to Free T4  3. Acute kidney injury [facility]) Patient suffered from AKI during his hospitalization.  Review of chart- serum creatinine 1.48 with lowest EGFR of 49.  Since discharge, this has resolved solved and it is recommended to avoid nephrotoxic agent as able.  Will repeat BMP today to look at kidney function and electrolytes. - CBC with Differential/Platelet - Comprehensive metabolic panel with GFR - TSH Rfx on Abnormal to Free T4  4. Blood glucose elevated Patient has had multiple elevated fasting blood sugars in hospital.  Review of the chart  shows that no recent hemoglobin A1c has been performed.  Will update hemoglobin A1c today. - Hemoglobin A1c  5. Wound healing, delayed Physical exam with healing old PEG tube site over patient's left upper upper abdomen about the size of a dime.  Patient reports that it does look better and is getting smaller.  Denies foul odor, yellow/green drainage, fever/chills, pain, swelling and increased redness around the site.  Patient is already having home health come to assess the wound on his right upper arm.  Will place referral for home health care agency to also look at patient's abdomen.  Bandage change in office today.  Wound care orders include change dressing every 24 hours or when saturated or wet.  Place dry 2 x 2 gauze over open wound and secure with medical tape.  Use zinc oxide barrier cream as needed to help protect surrounding skin from irritation.  Will have patient return in 2 weeks to assess healing.  - Hemoglobin A1c  6. 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 Currently followed by oncology-- Dr. Jacalyn Martin & Dr. Valentine Gasmen.  - CBC with Differential/Platelet  7. Essential hypertension Patient presents today with well-controlled blood pressure. Patient in no acute distress and is well-appearing. Denies chest pain, shortness of breath, lower extremity edema, vision changes, headaches. Cardiovascular exam with heart regular rate and rhythm. Normal heart sounds, no murmurs present. No lower extremity edema present. Lungs clear to auscultation bilaterally.  Review of chart regarding discharge paperwork and updated medication list.recommended patient to stop taking nephrotoxic medications due to recent AKI.  He states that he is currently taking Cozaar  100 mg and Coreg  25 mg daily for his blood pressure.  Will update labs today to assess kidney function and potassium level and determine best pharmacotherapy for patient.   -  Comprehensive metabolic panel with GFR  8. History of cardiac arrest Patient had cardiac cath on 211 with severe native CAD with patent LIMA to LAD and SVG to OM.  SVG-RCA  is occluded but did not appear acute and unknown likely the inciting factor for cardiac arrest.  Echocardiogram done on 04/07/2023 LVEF 40 to 45%, mild LVH, grade 1 DD, RV systolic function not well-visualized, RV size is normal.  Reviewed recent echocardiogram. Left ventricular ejection fraction, by estimation, is 55 to 60%. The left ventricle has normal function. The left ventricle has no regional wall motion abnormalities. There is mild left ventricular hypertrophy. Right ventricular systolic function is normal. The right ventricular size is normal. - CBC with Differential/Platelet - Comprehensive metabolic panel with GFR - TSH Rfx on Abnormal to Free T4  9. CVA, old, cognitive deficits MRI brain done 214 with 2 small acute infarcts affecting the cortical brain in the right frontal region.  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  Patient had carotid ultrasound showing less than 50% stenosis.  Patient to continue ASA 81 mg daily and atorvastatin  80 mg daily. Will repeat fasting lipid panel in future.   10. 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   Wilhelmena Hanson, FNP

## 2023-07-07 ENCOUNTER — Encounter: Payer: Self-pay | Admitting: Family Medicine

## 2023-07-07 ENCOUNTER — Other Ambulatory Visit: Payer: Self-pay

## 2023-07-07 ENCOUNTER — Encounter: Payer: Self-pay | Admitting: Internal Medicine

## 2023-07-07 DIAGNOSIS — T8189XD Other complications of procedures, not elsewhere classified, subsequent encounter: Secondary | ICD-10-CM | POA: Diagnosis not present

## 2023-07-07 DIAGNOSIS — C770 Secondary and unspecified malignant neoplasm of lymph nodes of head, face and neck: Secondary | ICD-10-CM | POA: Insufficient documentation

## 2023-07-07 DIAGNOSIS — J9602 Acute respiratory failure with hypercapnia: Secondary | ICD-10-CM | POA: Diagnosis not present

## 2023-07-07 DIAGNOSIS — I10 Essential (primary) hypertension: Secondary | ICD-10-CM | POA: Insufficient documentation

## 2023-07-07 DIAGNOSIS — I110 Hypertensive heart disease with heart failure: Secondary | ICD-10-CM | POA: Diagnosis not present

## 2023-07-07 DIAGNOSIS — M1990 Unspecified osteoarthritis, unspecified site: Secondary | ICD-10-CM | POA: Diagnosis not present

## 2023-07-07 DIAGNOSIS — K219 Gastro-esophageal reflux disease without esophagitis: Secondary | ICD-10-CM | POA: Diagnosis not present

## 2023-07-07 DIAGNOSIS — I5023 Acute on chronic systolic (congestive) heart failure: Secondary | ICD-10-CM | POA: Diagnosis not present

## 2023-07-07 LAB — CBC WITH DIFFERENTIAL/PLATELET
Basophils Absolute: 0 10*3/uL (ref 0.0–0.2)
Basos: 1 %
EOS (ABSOLUTE): 0.2 10*3/uL (ref 0.0–0.4)
Eos: 5 %
Hematocrit: 34.7 % — ABNORMAL LOW (ref 37.5–51.0)
Hemoglobin: 10.9 g/dL — ABNORMAL LOW (ref 13.0–17.7)
Immature Grans (Abs): 0 10*3/uL (ref 0.0–0.1)
Immature Granulocytes: 0 %
Lymphocytes Absolute: 1 10*3/uL (ref 0.7–3.1)
Lymphs: 21 %
MCH: 30.3 pg (ref 26.6–33.0)
MCHC: 31.4 g/dL — ABNORMAL LOW (ref 31.5–35.7)
MCV: 96 fL (ref 79–97)
Monocytes Absolute: 0.5 10*3/uL (ref 0.1–0.9)
Monocytes: 10 %
Neutrophils Absolute: 2.9 10*3/uL (ref 1.4–7.0)
Neutrophils: 63 %
Platelets: 148 10*3/uL — ABNORMAL LOW (ref 150–450)
RBC: 3.6 x10E6/uL — ABNORMAL LOW (ref 4.14–5.80)
RDW: 13.2 % (ref 11.6–15.4)
WBC: 4.7 10*3/uL (ref 3.4–10.8)

## 2023-07-07 LAB — COMPREHENSIVE METABOLIC PANEL WITH GFR
ALT: 9 IU/L (ref 0–44)
AST: 14 IU/L (ref 0–40)
Albumin: 4 g/dL (ref 3.8–4.8)
Alkaline Phosphatase: 84 IU/L (ref 44–121)
BUN/Creatinine Ratio: 8 — ABNORMAL LOW (ref 10–24)
BUN: 11 mg/dL (ref 8–27)
Bilirubin Total: 0.4 mg/dL (ref 0.0–1.2)
CO2: 23 mmol/L (ref 20–29)
Calcium: 9 mg/dL (ref 8.6–10.2)
Chloride: 105 mmol/L (ref 96–106)
Creatinine, Ser: 1.3 mg/dL — ABNORMAL HIGH (ref 0.76–1.27)
Globulin, Total: 2.3 g/dL (ref 1.5–4.5)
Glucose: 95 mg/dL (ref 70–99)
Potassium: 4.2 mmol/L (ref 3.5–5.2)
Sodium: 142 mmol/L (ref 134–144)
Total Protein: 6.3 g/dL (ref 6.0–8.5)
eGFR: 57 mL/min/{1.73_m2} — ABNORMAL LOW (ref >59)

## 2023-07-07 LAB — TSH RFX ON ABNORMAL TO FREE T4: TSH: 33.9 u[IU]/mL — ABNORMAL HIGH (ref 0.450–4.500)

## 2023-07-07 LAB — T4F: T4,Free (Direct): 0.82 ng/dL (ref 0.82–1.77)

## 2023-07-08 ENCOUNTER — Telehealth: Payer: Self-pay

## 2023-07-08 NOTE — Telephone Encounter (Signed)
 Office not faxed to [facility] [phone].

## 2023-07-12 DIAGNOSIS — I110 Hypertensive heart disease with heart failure: Secondary | ICD-10-CM | POA: Diagnosis not present

## 2023-07-12 DIAGNOSIS — T8189XD Other complications of procedures, not elsewhere classified, subsequent encounter: Secondary | ICD-10-CM | POA: Diagnosis not present

## 2023-07-12 DIAGNOSIS — J9602 Acute respiratory failure with hypercapnia: Secondary | ICD-10-CM | POA: Diagnosis not present

## 2023-07-12 DIAGNOSIS — I5023 Acute on chronic systolic (congestive) heart failure: Secondary | ICD-10-CM | POA: Diagnosis not present

## 2023-07-12 DIAGNOSIS — M1990 Unspecified osteoarthritis, unspecified site: Secondary | ICD-10-CM | POA: Diagnosis not present

## 2023-07-12 DIAGNOSIS — K219 Gastro-esophageal reflux disease without esophagitis: Secondary | ICD-10-CM | POA: Diagnosis not present

## 2023-07-15 ENCOUNTER — Ambulatory Visit: Payer: Self-pay | Admitting: Family Medicine

## 2023-07-17 ENCOUNTER — Other Ambulatory Visit: Payer: Self-pay

## 2023-07-17 ENCOUNTER — Emergency Department

## 2023-07-17 ENCOUNTER — Ambulatory Visit
Admission: EM | Admit: 2023-07-17 | Discharge: 2023-07-17 | Disposition: A | Discharge status: Home / Self Care | Attending: Physician Assistant | Admitting: Physician Assistant

## 2023-07-17 ENCOUNTER — Emergency Department
Admission: EM | Admit: 2023-07-17 | Discharge: 2023-07-17 | Disposition: A | Discharge status: Home / Self Care | Attending: Emergency Medicine | Admitting: Emergency Medicine

## 2023-07-17 DIAGNOSIS — B370 Candidal stomatitis: Secondary | ICD-10-CM | POA: Diagnosis not present

## 2023-07-17 DIAGNOSIS — Z86711 Personal history of pulmonary embolism: Secondary | ICD-10-CM | POA: Insufficient documentation

## 2023-07-17 DIAGNOSIS — Z5189 Encounter for other specified aftercare: Secondary | ICD-10-CM | POA: Diagnosis not present

## 2023-07-17 DIAGNOSIS — K9420 Gastrostomy complication, unspecified: Secondary | ICD-10-CM | POA: Diagnosis not present

## 2023-07-17 DIAGNOSIS — I509 Heart failure, unspecified: Secondary | ICD-10-CM | POA: Diagnosis not present

## 2023-07-17 DIAGNOSIS — T8189XA Other complications of procedures, not elsewhere classified, initial encounter: Secondary | ICD-10-CM | POA: Insufficient documentation

## 2023-07-17 DIAGNOSIS — N281 Cyst of kidney, acquired: Secondary | ICD-10-CM | POA: Diagnosis not present

## 2023-07-17 DIAGNOSIS — R079 Chest pain, unspecified: Secondary | ICD-10-CM | POA: Insufficient documentation

## 2023-07-17 DIAGNOSIS — C029 Malignant neoplasm of tongue, unspecified: Secondary | ICD-10-CM | POA: Diagnosis not present

## 2023-07-17 DIAGNOSIS — J984 Other disorders of lung: Secondary | ICD-10-CM | POA: Diagnosis not present

## 2023-07-17 DIAGNOSIS — Z8674 Personal history of sudden cardiac arrest: Secondary | ICD-10-CM | POA: Diagnosis not present

## 2023-07-17 DIAGNOSIS — Z7901 Long term (current) use of anticoagulants: Secondary | ICD-10-CM | POA: Insufficient documentation

## 2023-07-17 DIAGNOSIS — I2510 Atherosclerotic heart disease of native coronary artery without angina pectoris: Secondary | ICD-10-CM | POA: Diagnosis not present

## 2023-07-17 DIAGNOSIS — R0789 Other chest pain: Secondary | ICD-10-CM | POA: Diagnosis not present

## 2023-07-17 DIAGNOSIS — Z85810 Personal history of malignant neoplasm of tongue: Secondary | ICD-10-CM | POA: Diagnosis not present

## 2023-07-17 DIAGNOSIS — N200 Calculus of kidney: Secondary | ICD-10-CM | POA: Diagnosis not present

## 2023-07-17 DIAGNOSIS — I214 Non-ST elevation (NSTEMI) myocardial infarction: Secondary | ICD-10-CM | POA: Diagnosis not present

## 2023-07-17 LAB — HEPATIC FUNCTION PANEL
ALT: 12 U/L (ref 0–44)
AST: 19 U/L (ref 15–41)
Albumin: 3.8 g/dL (ref 3.5–5.0)
Alkaline Phosphatase: 56 U/L (ref 38–126)
Bilirubin, Direct: 0.1 mg/dL (ref 0.0–0.2)
Indirect Bilirubin: 0.7 mg/dL (ref 0.3–0.9)
Total Bilirubin: 0.8 mg/dL (ref 0.0–1.2)
Total Protein: 6.3 g/dL — ABNORMAL LOW (ref 6.5–8.1)

## 2023-07-17 LAB — TROPONIN I (HIGH SENSITIVITY)
Troponin I (High Sensitivity): 15 ng/L (ref <18)
Troponin I (High Sensitivity): 15 ng/L (ref <18)

## 2023-07-17 LAB — BASIC METABOLIC PANEL WITH GFR
Anion gap: 7 (ref 5–15)
BUN: 13 mg/dL (ref 8–23)
CO2: 25 mmol/L (ref 22–32)
Calcium: 8.8 mg/dL — ABNORMAL LOW (ref 8.9–10.3)
Chloride: 105 mmol/L (ref 98–111)
Creatinine, Ser: 1.18 mg/dL (ref 0.61–1.24)
GFR, Estimated: >60 mL/min (ref >60)
Glucose, Bld: 98 mg/dL (ref 70–99)
Potassium: 4.1 mmol/L (ref 3.5–5.1)
Sodium: 137 mmol/L (ref 135–145)

## 2023-07-17 LAB — URINALYSIS, W/ REFLEX TO CULTURE (INFECTION SUSPECTED)
Bacteria, UA: NONE SEEN
Bilirubin Urine: NEGATIVE
Glucose, UA: NEGATIVE mg/dL
Hgb urine dipstick: NEGATIVE
Ketones, ur: NEGATIVE mg/dL
Leukocytes,Ua: NEGATIVE
Nitrite: NEGATIVE
Protein, ur: NEGATIVE mg/dL
Specific Gravity, Urine: 1.044 — ABNORMAL HIGH (ref 1.005–1.030)
Squamous Epithelial / HPF: 0 /HPF (ref 0–5)
pH: 7 (ref 5.0–8.0)

## 2023-07-17 LAB — CBC
HCT: 35.1 % — ABNORMAL LOW (ref 39.0–52.0)
Hemoglobin: 11.3 g/dL — ABNORMAL LOW (ref 13.0–17.0)
MCH: 30.1 pg (ref 26.0–34.0)
MCHC: 32.2 g/dL (ref 30.0–36.0)
MCV: 93.4 fL (ref 80.0–100.0)
Platelets: 108 10*3/uL — ABNORMAL LOW (ref 150–400)
RBC: 3.76 MIL/uL — ABNORMAL LOW (ref 4.22–5.81)
RDW: 13.6 % (ref 11.5–15.5)
WBC: 5.4 10*3/uL (ref 4.0–10.5)
nRBC: 0 % (ref 0.0–0.2)

## 2023-07-17 LAB — LACTIC ACID, PLASMA: Lactic Acid, Venous: 1 mmol/L (ref 0.5–1.9)

## 2023-07-17 MED ORDER — FLUCONAZOLE 150 MG PO TABS: ORAL_TABLET | ORAL | 0 refills | Status: DC

## 2023-07-17 MED ORDER — NYSTATIN 100000 UNIT/ML MT SUSP: OROMUCOSAL | 0 refills | Status: DC

## 2023-07-17 MED ORDER — IOHEXOL 350 MG/ML SOLN
100.0000 mL | Freq: Once | INTRAVENOUS | Status: AC | PRN
Start: 2023-07-17 — End: 2023-07-17
  Administered 2023-07-17: 100 mL via INTRAVENOUS

## 2023-07-17 NOTE — ED Triage Notes (Addendum)
 Pt to ed from urgent care for CP and possible wound infection. Pt has significant cardiac HX with arrest in February. Pt is caox4, in no active acute distress and ambulatory in triage. Pt has port in place as well for IV and blood access.

## 2023-07-17 NOTE — Discharge Instructions (Signed)
-  Please go to ER for evaluation of chest pain that you had last night. I think you need cardiac enzymes and to be monitored for a bit.  You have been advised to follow up immediately in the emergency department for concerning signs.symptoms. If you declined EMS transport, please have a family member take you directly to the ED at this time. Do not delay. Based on concerns about condition, if you do not follow up in th e ED, you may risk poor outcomes including worsening of condition, delayed treatment and potentially life threatening issues. If you have declined to go to the ED at this time, you should call your PCP immediately to set up a follow up appointment.  Go to ED for red flag symptoms, including; fevers you cannot reduce with Tylenol /Motrin, severe headaches, vision changes, numbness/weakness in part of the body, lethargy, confusion, intractable vomiting, severe dehydration, chest pain, breathing difficulty, severe persistent abdominal or pelvic pain, signs of severe infection (increased redness, swelling of an area), feeling faint or passing out, dizziness, etc. You should especially go to the ED for sudden acute worsening of condition if you do not elect to go at this time.

## 2023-07-17 NOTE — ED Triage Notes (Signed)
Called from lobby. No answer

## 2023-07-17 NOTE — ED Triage Notes (Signed)
 Pt c/o continued thrush from 07/01/23  Pt states that he has seen his primary doctor for the pain in his right arm from the IV.  Pt states that he is having drainage from a feeding tube that was recently removed by Dr. Luster Salters in [place].  Pt was told that it would close within a week but it is still draining 7 weeks later.   Pt c/o upper abdominal and lower left chest pain around the feeding tube incision. Pt states that the pain is new and intense. Pt is worried about a possible infection.   Pt states that the pain is a 9/10 on the 10 point pain scale and is tender to touch.

## 2023-07-17 NOTE — ED Provider Notes (Signed)
 Cory Hess Provider Note    Event Date/Time   First MD Initiated Contact with Patient 07/17/23 1633     (approximate)   History   Chest Pain   HPI  Cory Hess is a 78 y.o. male with history of heart failure, hyperlipidemia, CAD, here for pain around his prior G-tube site.  Patient states that he had his G-tube removed 3 to 4 weeks ago, was told by the surgeon that it should heal up in a week.  States that he is having pain around G-tube site, states that the wound has not been healing.  He denies any fever.  He denies any chest pain or shortness of breath.  Also states that he is getting wound care done at prior IV site at his right antecubital fossa that he was told is healing appropriately, is followed by wound care for this.  States that he had necrosis at that IV site but the wound appears much improved from before.  States that he typically has a cream to cover it.  Patient states that he has not followed up with the surgeon about the G-tube site.   On independent chart review, he was admitted in February after he experienced a cardiac arrest with ROSC, had transcutaneous pacing done, did have a cardiac cath that revealed severe native CAD with patent LIMA to LAD and SVG to OM.  The SVG to RCA is occluded but did not appear acute.  He also has history of PE on Eliquis.  He went to urgent care today who referred him here for further management, although patient does not know any chest pain today, he had told urgent care that he had chest pain yesterday.  Physical Exam   Triage Vital Signs: ED Triage Vitals  Encounter Vitals Group     BP 07/17/23 1622 120/71     Systolic BP Percentile --      Diastolic BP Percentile --      Pulse Rate 07/17/23 1622 69     Resp 07/17/23 1622 16     Temp 07/17/23 1622 98.6 F (37 C)     Temp Source 07/17/23 1622 Oral     SpO2 07/17/23 1622 100 %     Weight --      Height 07/17/23 1619 6' (1.829 m)     Head  Circumference --      Peak Flow --      Pain Score 07/17/23 1619 0     Pain Loc --      Pain Education --      Exclude from Growth Chart --     Most recent vital signs: Vitals:   07/17/23 1622  BP: 120/71  Pulse: 69  Resp: 16  Temp: 98.6 F (37 C)  SpO2: 100%     General: Awake, no distress.  CV:  Good peripheral perfusion.  Resp:  Normal effort.  Abd:  No distention.  Abdomen soft, mildly tender at the left upper quadrant site around his prior G-tube site, he does have a 1 cm superficial wound that does not appear deep, there is no obvious drainage, no surrounding erythema or swelling or fluctuance or induration. Other:  His right antecubital wound with small area of granulation tissue, there is no surrounding erythema, induration, purulence, tenderness to palpation.   ED Results / Procedures / Treatments   Labs (all labs ordered are listed, but only abnormal results are displayed) Labs Reviewed  BASIC METABOLIC PANEL WITH  GFR - Abnormal; Notable for the following components:      Result Value   Calcium  8.8 (*)    All other components within normal limits  CBC - Abnormal; Notable for the following components:   RBC 3.76 (*)    Hemoglobin 11.3 (*)    HCT 35.1 (*)    Platelets 108 (*)    All other components within normal limits  HEPATIC FUNCTION PANEL - Abnormal; Notable for the following components:   Total Protein 6.3 (*)    All other components within normal limits  URINALYSIS, W/ REFLEX TO CULTURE (INFECTION SUSPECTED) - Abnormal; Notable for the following components:   Color, Urine YELLOW (*)    APPearance CLEAR (*)    Specific Gravity, Urine 1.044 (*)    All other components within normal limits  LACTIC ACID, PLASMA  TROPONIN I (HIGH SENSITIVITY)  TROPONIN I (HIGH SENSITIVITY)     EKG  EKG shows, EKG shows sinus rhythm, rate of 65, 1 QRS, normal QTc, right bundle branch block, T wave inversion to aVL, V2, no ischemia ST elevation, not significant change  compared to prior   RADIOLOGY On my independent interpretation, CT without obvious PE   PROCEDURES:  Critical Care performed: No  Procedures   MEDICATIONS ORDERED IN ED: Medications  iohexol  (OMNIPAQUE ) 350 MG/ML injection 100 mL (100 mLs Intravenous Contrast Given 07/17/23 1801)     IMPRESSION / MDM / ASSESSMENT AND PLAN / ED COURSE  I reviewed the triage vital signs and the nursing notes.                              Differential diagnosis includes, but is not limited to, superficial wound, did consider if it tracked deeper, considered underlying abscess, GERD, gastritis, peptic ulcer disease, ACS, his right antecubital wound does not appear infected.  Will get labs, EKG, troponin, CT abdomen pelvis.  Patient's presentation is most consistent with acute presentation with potential threat to life or bodily function.  Independent interpretation of labs and imaging below.  On reassessment patient is well-appearing, discussed with him about imaging and lab results including incidental findings, discussed with him as well about following up with the surgeon who removed the G-tube for further management of [name].  It does not appear infected, no evidence of abscess or complication on CT.  Considered but no indication for inpatient admission at this time, he is safe for outpatient management.  Will discharge with strict return precautions.  Shared decision making done with patient he is agreeable with this plan.  The patient is on the cardiac monitor to evaluate for evidence of arrhythmia and/or significant heart rate changes.   Clinical Course as of 07/17/23 2010  Sun Jul 17, 2023  1704 DG Chest 2 View IMPRESSION: Postop chest.  Chest port.  Subtle nodular density along the left upper thorax of uncertain etiology and significance. Recommend follow-up x-ray or CT as clinically appropriate   [TT]  1855 CT Angio Chest PE W/Cm &/Or Wo Cm IMPRESSION: Chest:  1. No  evidence of pulmonary embolus. 2. No acute intrathoracic process. 3. Aortic Atherosclerosis (ICD10-I70.0). Coronary artery atherosclerosis.   [TT]  1855 CT ABDOMEN PELVIS W CONTRAST Abdomen/pelvis:  1. Stable nonobstructing 3 mm left renal calculus. 2. Postsurgical changes from prior left upper quadrant percutaneous gastrostomy tube, with no evidence of complication or abscess. 3.  Aortic Atherosclerosis (ICD10-I70.0).   [TT]  1855 Independent review of  labs, troponin is not elevated, no leukocytosis, lactate is not elevated, LFTs are not elevated, electrolytes not severely deranged. [TT]  1955 Urinalysis, w/ Reflex to Culture (Infection Suspected) -Urine, Clean Catch(!) UA not consistent with UTI. [TT]    Clinical Course User Index [TT] Drenda Gentle, Richard Champion, MD     FINAL CLINICAL IMPRESSION(S) / ED DIAGNOSES   Final diagnoses:  Nonspecific chest pain  Non-healing surgical wound, initial encounter  Kidney stone     Rx / DC Orders   ED Discharge Orders     None        Note:  This document was prepared using Dragon voice recognition software and may include unintentional dictation errors.    Shane Darling, MD 07/17/23 2011

## 2023-07-17 NOTE — ED Notes (Signed)
 Patient is being discharged from the Urgent Care and sent to the Emergency Department via POV . Per Soila Dunnings, patient is in need of higher level of care due to Chest pain, H/o Cardiac Arrest. Patient is aware and verbalizes understanding of plan of care.  Vitals:   07/17/23 1453  BP: (!) 140/78  Pulse: 75  Temp: 98.9 F (37.2 C)  SpO2: 99%

## 2023-07-17 NOTE — ED Provider Notes (Signed)
 [facility]    CSN: [id number] Arrival date & time: 07/17/23  1414      History   Chief Complaint Chief Complaint  Patient presents with   Abdominal Pain   Wound Check         HPI Cory Hess is a 78 y.o. male presenting for multiple complaints.  Patient reports concerns regarding drainage from a wound of his left abdomen where he had a feeding tube placed after cardiac arrest back in February of this year.  He says he had some pain around the site and of the left side of his chest that lasted for couple of hours last night and then he fell asleep.  When he woke up he was not having the discomfort.  Now he says there is some tenderness around the site and has been using.  He also would like to have the wound on his right elbow checked.  He states he suffered necrosis after someone missed the vein when they tried to start an IV.  He has had a wound care nurse coming every Tuesday to check the wound.  It is oozing and draining but overall is appearing to improve.  Patient also reports thrush.  He was seen here on 5/9 for thrush and prescribed nystatin .  Symptoms had resolved but then came back.  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  He says they did a surgery and "got it all."  At this time he is not complaining of any chest pain but he says it was severe last night.  Not associated with shortness of breath, palpitation, dizziness or weakness.  Of note, patient was seen here for the cardiac arrest back in February and actually came in for urinary symptoms only.  HPI  Past Medical History:  Diagnosis Date   Alternating RBBB & LBBB    Ankle swelling 2024   Arthritis    neck   Bladder tumor    CAD (coronary artery disease)    a. 08/2017 STEMI/Cath: LM 80ost/d, LAD 100p, LCX 95 ost/p, RCA 60p, 19m, RPDA 80ost; b. 08/2017 CABG x 3: LIMA->LAD, VG->OM, VG->PDA.   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 08/2017 TEE: EF 30-35%, sept/ant/inf HK, apical AK. 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 12/2017 Echo: EF 45-50%, diff HK. Gr1 DD. Mildly reduced RV fxn. Mild RAE; c. 09/2022 Echo: EF 55-60%, mod LVH, GrI DD, nl RV fxn, mildly dil RA, mild MR.   Hyperlipidemia LDL goal <70    Ischemic cardiomyopathy    a. 08/2017 TEE: EF 30-35%; b. 12/2017 Echo: EF 45-50%; c. 09/2022 Echo: EF 55-60%.   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   Essential hypertension    Blood glucose elevated 07/06/2023   Does not have primary care provider 07/01/2023   Thrush 07/01/2023   Former smoker 07/01/2023   Pulmonary embolus (HCC) 04/15/2023   Acute respiratory failure with hypoxia and hypercapnia (HCC) 04/10/2023   Acute CVA (cerebrovascular accident) (HCC) 04/09/2023   Hyperkalemia 04/06/2023   Acute kidney injury (HCC) 04/06/2023   Cardiac arrest (HCC) 04/05/2023  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   Acute on chronic HFrEF (heart failure with reduced ejection fraction) (HCC) 04/05/2023   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   Hematuria 11/06/2018   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   Coronary artery disease 08/28/2017   NSTEMI (non-ST elevated myocardial infarction) [facility])     Past Surgical History:  Procedure Laterality Date   CARDIAC CATHETERIZATION     CATARACT EXTRACTION W/PHACO Left 07/18/2018   Procedure: CATARACT EXTRACTION PHACO AND INTRAOCULAR LENS PLACEMENT (IOC)  LEFT;  Surgeon: Rosa College, MD;  Location: [facility];  Service: Ophthalmology;  Laterality: Left;   CATARACT EXTRACTION W/PHACO Right 08/14/2018   Procedure: CATARACT EXTRACTION PHACO AND INTRAOCULAR LENS PLACEMENT (IOC)  RIGHT;  Surgeon: Rosa College, MD;  Location: [facility];   Service: Ophthalmology;  Laterality: Right;   CORONARY ARTERY BYPASS GRAFT N/A 08/27/2017   Procedure: CORONARY ARTERY BYPASS GRAFTING (CABG) ON PUMP USING LEFT INTERNAL MAMMARY ARTERY AND LEFT GREATER SAPHENOUS VEIN VIA ENDOVEIN HARVEST;  Surgeon: Bartley Lightning, MD;  Location: [facility] OR;  Service: Open Heart Surgery;  Laterality: N/A;   CORONARY/GRAFT ACUTE MI REVASCULARIZATION N/A 08/27/2017   Procedure: Coronary/Graft Acute MI Revascularization;  Surgeon: Wenona Hamilton, MD;  Location: [facility] INVASIVE CV LAB;  Service: Cardiovascular;  Laterality: N/A;   CYSTOSCOPY W/ RETROGRADES Bilateral 12/25/2018   Procedure: CYSTOSCOPY WITH RETROGRADE PYELOGRAM;  Surgeon: Dustin Gimenez, MD;  Location: [facility] ORS;  Service: Urology;  Laterality: Bilateral;   FRACTURE SURGERY Right 1958   arm and left wrist compound fracture, no metal   HERNIA REPAIR Right    inguinial   IR IMAGING GUIDED PORT INSERTION  10/15/2022   LEFT HEART CATH AND CORONARY ANGIOGRAPHY N/A 08/27/2017   Procedure: LEFT HEART CATH AND CORONARY ANGIOGRAPHY;  Surgeon: Wenona Hamilton, MD;  Location: [facility] INVAS[facility] CV LAB;  Service: Cardiovascular;  Laterality: N/A;   PULMONARY THROMBECTOMY Bilateral 04/18/2023   Procedure: PULMONARY THROMBECTOMY;  Surgeon: Celso College, MD;  Location: [facility] INV[facility] CV LAB;  Service: Cardiovascular;  Laterality: Bilateral;   RIGHT/LEFT HEART CATH AND CORONARY/GRAFT ANGIOGRAPHY N/A 04/05/2023   Procedure: RIGHT/LEFT HEART CATH AND CORONARY/GRAFT ANGIOGRAPHY;  Surgeon: Sammy Crisp, MD;  Location: [facility] INV[facility] CV LAB;  Service: Cardiovascular;  Laterality: N/A;   TEMPORARY PACEMAKER N/A 04/05/2023   Procedure: TEMPORARY PACEMAKER;  Surgeon: Sammy Crisp, MD;  Location: [facility] INVAS[facility] CV LAB;  Service: Cardiovascular;  Laterality: N/A;   TRANSURETHRAL RESECTION OF BLADDER TUMOR WITH MITOMYCIN -C N/A 12/25/2018   Procedure: TRANSURETHRAL RESECTION OF BLADDER TUMOR WITH Gemcitabine ;  Surgeon: Dustin Gimenez, MD;   Location: [facility] ORS;  Service: Urology;  Laterality: N/A;       Home Medications    Prior to Admission medications   Medication Sig Start Date End Date Taking? Authorizing Provider  fluconazole  (DIFLUCAN ) 150 MG tablet Take 1 tab po once weekly 07/17/23  Yes Nancy Axon B, PA-C  nystatin  (MYCOSTATIN ) 100000 UNIT/ML suspension Swish and retain 5 ml in mouth for several seconds every 6h until 48 hours after symptoms resolve 07/17/23  Yes Floydene Hy, PA-C  acetaminophen  (TYLENOL ) 325 MG tablet Place 2 tablets (650 mg total) into feeding tube every 4 (four) hours as needed for headache or mild pain (pain score 1-3). 04/21/23   Delanna Fears, NP  aspirin  81 MG chewable tablet Place 1 tablet (81 mg total) into feeding tube daily. 04/22/23   Delanna Fears, NP  atorvastatin  (LIPITOR ) 80 MG tablet Place 1  tablet (80 mg total) into feeding tube daily. 04/22/23   Keene, Jeremiah D, NP  doxazosin (CARDURA) 2 MG tablet Take 2 mg by mouth daily. 06/27/23   [provider]  ELIQUIS 5 MG TABS tablet Take 5 mg by mouth 2 (two) times daily. 06/27/23   [provider]  famotidine  (PEPCID ) 20 MG tablet Place 1 tablet (20 mg total) into feeding tube daily. 04/22/23   Delanna Fears, NP  Mouthwashes (MOUTH RINSE) LIQD solution 15 mLs by Mouth Rinse route every 2 (two) hours. 04/21/23   Delanna Fears, NP    Family History Family History  Problem Relation Age of Onset   Heart failure Mother    Lung disease Father    Stroke Father    Cervical cancer Maternal Grandmother     Social History Social History   Tobacco Use   Smoking status: Former    Current packs/day: 0.00    Types: Cigarettes    Quit date: 2000    Years since quitting: 25.4   Smokeless tobacco: Former    Types: Snuff  Vaping Use   Vaping status: Never Used  Substance Use Topics   Alcohol use: Not Currently   Drug use: Not Currently     Allergies   Patient has no known allergies.   Review of  Systems Review of Systems  Constitutional:  Negative for fatigue and fever.  Respiratory:  Negative for shortness of breath.   Cardiovascular:  Positive for chest pain. Negative for palpitations and leg swelling.  Gastrointestinal:  Positive for abdominal pain. Negative for constipation, diarrhea, nausea and vomiting.  Musculoskeletal:  Negative for arthralgias.  Skin:  Positive for wound. Negative for color change.  Neurological:  Negative for dizziness, weakness and headaches.     Physical Exam Triage Vital Signs ED Triage Vitals [07/17/23 1433]  Encounter Vitals Group     BP      Systolic BP Percentile      Diastolic BP Percentile      Pulse      Resp      Temp      Temp src      SpO2      Weight 210 lb (95.3 kg)     Height 6' (1.829 m)     Head Circumference      Peak Flow      Pain Score 9     Pain Loc      Pain Education      Exclude from Growth Chart    No data found.  Updated Vital Signs BP (!) 140/78 (BP Location: Left Arm)   Pulse 75   Temp 98.9 F (37.2 C) (Oral)   Ht 6' (1.829 m)   Wt 210 lb (95.3 kg)   SpO2 99%   BMI 28.48 kg/m     Physical Exam Vitals and nursing note reviewed.  Constitutional:      General: He is not in acute distress.    Appearance: He is well-developed.  HENT:     Head: Normocephalic and atraumatic.     Mouth/Throat:     Pharynx: Posterior oropharyngeal erythema present.  Eyes:     General: No scleral icterus.    Conjunctiva/sclera: Conjunctivae normal.  Cardiovascular:     Rate and Rhythm: Normal rate and regular rhythm.     Heart sounds: Normal heart sounds.  Pulmonary:     Effort: Pulmonary effort is normal. No respiratory distress.     Breath sounds: Normal  breath sounds.  Abdominal:     Palpations: Abdomen is soft.     Tenderness: There is no abdominal tenderness.  Musculoskeletal:     Cervical back: Neck supple.  Skin:    General: Skin is warm and dry.     Capillary Refill: Capillary refill takes less than  2 seconds.     Findings: Rash (erythematous rash around wound of left upper abdomen--consistent with contact dermatitis) present.     Comments: Large wound right AC region without drainage. Appears to be healing  Small wound left upper abdomen which also appears to be in stages of healing  Neurological:     Mental Status: He is alert.  Psychiatric:        Mood and Affect: Mood normal.         UC Treatments / Results  Labs (all labs ordered are listed, but only abnormal results are displayed) Labs Reviewed - No data to display  EKG   Radiology No results found.  Procedures ED EKG  Date/Time: 07/17/2023 4:36 PM  Performed by: Floydene Hy, PA-C Authorized by: Floydene Hy, PA-C   Interpretation:    Interpretation: abnormal   Rate:    ECG rate:  63   ECG rate assessment: normal   Rhythm:    Rhythm: sinus rhythm     Rhythm comment:  Premature supraventricular complexes QRS:    QRS axis:  Left   QRS conduction: RBBB   Comments:     Sinus rhythm with premature supraventricular complexes, RBBB, left axis deviation  (including critical care time)  Medications Ordered in UC Medications - No data to display  Initial Impression / Assessment and Plan / UC Course  I have reviewed the triage vital signs and the nursing notes.  Pertinent labs & imaging results that were available during my care of the patient were reviewed by me and considered in my medical decision making (see chart for details).   78 year old male presents for multiple complaints.  States that he has continued thrush.  Seen on 5/9 and diagnosed with thrush.  Took nystatin  and got better.  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  Also reporting concerns for possible wound infection where he had feeding tube placed a few months ago after a cardiac arrest.  The wound has been draining a bit of fluid.  Also reporting wound of the right elbow that has been present  for the past couple of months.  He says it is improving.  Additionally reports episode of chest pain that lasted for couple of hours last night.  Not currently experiencing chest pain.  Vitals are stable.  He is overall well-appearing and in no acute distress.  See images included in chart.  There is an open wound over the left upper abdomen with scant blood on exam light brown drainage and surrounding an erythematous rash which is consistent with the discomfort told he was related to adhesive bandage, informed him.  A healing open wound of the right AC region.  There is associated swelling but no apparent tenderness or drainage.    Performed today shows normal sinus rhythm with premature supraventricular complexes, right bundle branch block, left axis deviation.  Evaluation of throat reveals erythema, postnasal drainage and white exudate with erythema of improving posterior pharynx which may be consistent with thrush so I sent Nystatin  and diflucan .  Patient has history of sudden cardiac arrest in our clinic a couple of  months ago.  I do have concerns that he had chest pain last night and has an abnormal EKG at this time.  I encouraged him to go to the emergency department to have labs performed targeting troponins and hopefully be monitored for a period of time to make sure he is okay before returning home.  He lives alone and explained that I did not want him to have an event at home alone.  Patient is understanding and agreeable.  Declines EMS and will take him [facility].  His vitals are stable and he is currently asymptomatic.  He is leaving in stable condition.    Final Clinical Impressions(s) / UC Diagnoses   Final diagnoses:  Chest pain, unspecified type  Visit for wound check  Thrush  History of cardiac arrest     Discharge Instructions      -Please go to ER for evaluation of chest pain that you had last night. I think you need cardiac enzymes and to be monitored for a  bit.  You have been advised to follow up immediately in the emergency department for concerning signs.symptoms. If you declined EMS transport, please have a family member take you directly to the ED at this time. Do not delay. Based on concerns about condition, if you do not follow up in th e ED, you may risk poor outcomes including worsening of condition, delayed treatment and potentially life threatening issues. If you have declined to go to the ED at this time, you should call your PCP immediately to set up a follow up appointment.  Go to ED for red flag symptoms, including; fevers you cannot reduce with Tylenol /Motrin, severe headaches, vision changes, numbness/weakness in part of the body, lethargy, confusion, intractable vomiting, severe dehydration, chest pain, breathing difficulty, severe persistent abdominal or pelvic pain, signs of severe infection (increased redness, swelling of an area), feeling faint or passing out, dizziness, etc. You should especially go to the ED for sudden acute worsening of condition if you do not elect to go at this time.    ED Prescriptions     Medication Sig Dispense Auth. Provider   nystatin  (MYCOSTATIN ) 100000 UNIT/ML suspension Swish and retain 5 ml in mouth for several seconds every 6h until 48 hours after symptoms resolve 473 mL Nancy Axon B, PA-C   fluconazole  (DIFLUCAN ) 150 MG tablet Take 1 tab po once weekly 4 tablet Floydene Hy, PA-C      PDMP not reviewed this encounter.   Nancy Axon B, PA-C 07/25/23 [phone]

## 2023-07-17 NOTE — Discharge Instructions (Signed)
 Please make sure to follow-up with your surgeon who placed the G-tube for further management of your G-tube site healing.  If you have any purulent drainage, redness around the site, fever, swelling to the area, please return for further evaluation.

## 2023-07-19 DIAGNOSIS — J9602 Acute respiratory failure with hypercapnia: Secondary | ICD-10-CM | POA: Diagnosis not present

## 2023-07-19 DIAGNOSIS — I5023 Acute on chronic systolic (congestive) heart failure: Secondary | ICD-10-CM | POA: Diagnosis not present

## 2023-07-19 DIAGNOSIS — I110 Hypertensive heart disease with heart failure: Secondary | ICD-10-CM | POA: Diagnosis not present

## 2023-07-19 DIAGNOSIS — K219 Gastro-esophageal reflux disease without esophagitis: Secondary | ICD-10-CM | POA: Diagnosis not present

## 2023-07-19 DIAGNOSIS — T8189XD Other complications of procedures, not elsewhere classified, subsequent encounter: Secondary | ICD-10-CM | POA: Diagnosis not present

## 2023-07-19 DIAGNOSIS — M1990 Unspecified osteoarthritis, unspecified site: Secondary | ICD-10-CM | POA: Diagnosis not present

## 2023-07-20 ENCOUNTER — Ambulatory Visit: Admitting: Family Medicine

## 2023-07-21 ENCOUNTER — Other Ambulatory Visit: Payer: Self-pay

## 2023-07-25 ENCOUNTER — Encounter: Payer: Self-pay | Admitting: Family Medicine

## 2023-07-25 ENCOUNTER — Ambulatory Visit (INDEPENDENT_AMBULATORY_CARE_PROVIDER_SITE_OTHER): Admitting: Family Medicine

## 2023-07-25 VITALS — BP 95/53 | HR 85 | Temp 97.5°F | Wt 210.0 lb

## 2023-07-25 DIAGNOSIS — I2510 Atherosclerotic heart disease of native coronary artery without angina pectoris: Secondary | ICD-10-CM | POA: Diagnosis not present

## 2023-07-25 DIAGNOSIS — S31109S Unspecified open wound of abdominal wall, unspecified quadrant without penetration into peritoneal cavity, sequela: Secondary | ICD-10-CM

## 2023-07-25 DIAGNOSIS — I469 Cardiac arrest, cause unspecified: Secondary | ICD-10-CM | POA: Diagnosis not present

## 2023-07-25 DIAGNOSIS — I5023 Acute on chronic systolic (congestive) heart failure: Secondary | ICD-10-CM

## 2023-07-25 DIAGNOSIS — Z951 Presence of aortocoronary bypass graft: Secondary | ICD-10-CM | POA: Diagnosis not present

## 2023-07-25 DIAGNOSIS — I214 Non-ST elevation (NSTEMI) myocardial infarction: Secondary | ICD-10-CM | POA: Diagnosis not present

## 2023-07-25 DIAGNOSIS — S41101S Unspecified open wound of right upper arm, sequela: Secondary | ICD-10-CM

## 2023-07-25 MED ORDER — BLOOD PRESSURE KIT KIT
1.0000 | PACK | Freq: Three times a day (TID) | 0 refills | Status: AC
Start: 2023-07-25 — End: ?

## 2023-07-25 NOTE — Patient Instructions (Addendum)
 STOP TAKING LOSARTAN   PLEASE INCREASE YOUR FLUID INTAKE

## 2023-07-25 NOTE — Progress Notes (Unsigned)
   Established Patient Office Visit  Subjective  Patient ID: Cory Hess, male    DOB: 03/08/1945  Age: 78 y.o. MRN: [id number]  Chief Complaint  Patient presents with  . Hypertension   HYPERTENSION: Cory Hess presents for the medical management of hypertension.  Patient's current hypertension medication regimen is: Coreg  BID & losartan  100mg  daily  Patient is currently taking prescribed medications for HTN.  Patient is NOT regularly keeping a check on BP at home.  Denies headache, dizziness, CP, SHOB, vision changes.    95/53  BP Readings from Last 3 Encounters:  07/25/23 (!) 95/53  07/17/23 (!) 153/79  07/17/23 (!) 140/78   ROS: see HPI     Objective:      BP (!) 95/53   Pulse 85   Temp (!) 97.5 F (36.4 C) (Oral)   Wt 210 lb (95.3 kg)   BMI 28.48 kg/m  BP Readings from Last 3 Encounters:  07/25/23 (!) 95/53  07/17/23 (!) 153/79  07/17/23 (!) 140/78     Physical Exam    Assessment & Plan:      Return in about 4 days (around 07/29/2023) for RN visit for BP .    Wilhelmena Hanson, FNP

## 2023-07-26 ENCOUNTER — Other Ambulatory Visit: Payer: Self-pay

## 2023-07-27 ENCOUNTER — Telehealth: Payer: Self-pay | Admitting: Family Medicine

## 2023-07-27 DIAGNOSIS — T8189XD Other complications of procedures, not elsewhere classified, subsequent encounter: Secondary | ICD-10-CM | POA: Diagnosis not present

## 2023-07-27 DIAGNOSIS — I110 Hypertensive heart disease with heart failure: Secondary | ICD-10-CM | POA: Diagnosis not present

## 2023-07-27 DIAGNOSIS — K219 Gastro-esophageal reflux disease without esophagitis: Secondary | ICD-10-CM | POA: Diagnosis not present

## 2023-07-27 DIAGNOSIS — I5023 Acute on chronic systolic (congestive) heart failure: Secondary | ICD-10-CM | POA: Diagnosis not present

## 2023-07-27 DIAGNOSIS — J9602 Acute respiratory failure with hypercapnia: Secondary | ICD-10-CM | POA: Diagnosis not present

## 2023-07-27 DIAGNOSIS — M1990 Unspecified osteoarthritis, unspecified site: Secondary | ICD-10-CM | POA: Diagnosis not present

## 2023-07-27 NOTE — Telephone Encounter (Signed)
 Attempted to contact the patient to discuss moving his appointment to an earlier time to see the provider. The patient did not answer, and a voicemail was left requesting a call back.  Subsequently, I contacted the patient's sister to inquire if he had a new phone number. She confirmed that he has a new cell phone and provided the updated number: [phone].  I called the updated number and successfully reached the patient. I informed him that the provider requested to see him earlier on Friday instead of his originally scheduled afternoon appointment with the nurse. The patient agreed to the change and is now scheduled for 07/29/23 at 9:50 AM.

## 2023-07-28 DIAGNOSIS — T8189XD Other complications of procedures, not elsewhere classified, subsequent encounter: Secondary | ICD-10-CM | POA: Diagnosis not present

## 2023-07-28 DIAGNOSIS — N401 Enlarged prostate with lower urinary tract symptoms: Secondary | ICD-10-CM | POA: Diagnosis not present

## 2023-07-28 DIAGNOSIS — R338 Other retention of urine: Secondary | ICD-10-CM | POA: Diagnosis not present

## 2023-07-28 DIAGNOSIS — Z79891 Long term (current) use of opiate analgesic: Secondary | ICD-10-CM | POA: Diagnosis not present

## 2023-07-28 DIAGNOSIS — I5023 Acute on chronic systolic (congestive) heart failure: Secondary | ICD-10-CM | POA: Diagnosis not present

## 2023-07-28 DIAGNOSIS — C01 Malignant neoplasm of base of tongue: Secondary | ICD-10-CM | POA: Diagnosis not present

## 2023-07-28 DIAGNOSIS — R1312 Dysphagia, oropharyngeal phase: Secondary | ICD-10-CM | POA: Diagnosis not present

## 2023-07-28 DIAGNOSIS — I447 Left bundle-branch block, unspecified: Secondary | ICD-10-CM | POA: Diagnosis not present

## 2023-07-28 DIAGNOSIS — J9602 Acute respiratory failure with hypercapnia: Secondary | ICD-10-CM | POA: Diagnosis not present

## 2023-07-28 DIAGNOSIS — Z7982 Long term (current) use of aspirin: Secondary | ICD-10-CM | POA: Diagnosis not present

## 2023-07-28 DIAGNOSIS — I252 Old myocardial infarction: Secondary | ICD-10-CM | POA: Diagnosis not present

## 2023-07-28 DIAGNOSIS — E119 Type 2 diabetes mellitus without complications: Secondary | ICD-10-CM | POA: Diagnosis not present

## 2023-07-28 DIAGNOSIS — I4510 Unspecified right bundle-branch block: Secondary | ICD-10-CM | POA: Diagnosis not present

## 2023-07-28 DIAGNOSIS — Z7901 Long term (current) use of anticoagulants: Secondary | ICD-10-CM | POA: Diagnosis not present

## 2023-07-28 DIAGNOSIS — Z8674 Personal history of sudden cardiac arrest: Secondary | ICD-10-CM | POA: Diagnosis not present

## 2023-07-28 DIAGNOSIS — K219 Gastro-esophageal reflux disease without esophagitis: Secondary | ICD-10-CM | POA: Diagnosis not present

## 2023-07-28 DIAGNOSIS — C099 Malignant neoplasm of tonsil, unspecified: Secondary | ICD-10-CM | POA: Diagnosis not present

## 2023-07-28 DIAGNOSIS — I2510 Atherosclerotic heart disease of native coronary artery without angina pectoris: Secondary | ICD-10-CM | POA: Diagnosis not present

## 2023-07-28 DIAGNOSIS — M1990 Unspecified osteoarthritis, unspecified site: Secondary | ICD-10-CM | POA: Diagnosis not present

## 2023-07-28 DIAGNOSIS — I255 Ischemic cardiomyopathy: Secondary | ICD-10-CM | POA: Diagnosis not present

## 2023-07-28 DIAGNOSIS — I110 Hypertensive heart disease with heart failure: Secondary | ICD-10-CM | POA: Diagnosis not present

## 2023-07-28 DIAGNOSIS — E875 Hyperkalemia: Secondary | ICD-10-CM | POA: Diagnosis not present

## 2023-07-28 DIAGNOSIS — Z79899 Other long term (current) drug therapy: Secondary | ICD-10-CM | POA: Diagnosis not present

## 2023-07-28 DIAGNOSIS — I2699 Other pulmonary embolism without acute cor pulmonale: Secondary | ICD-10-CM | POA: Diagnosis not present

## 2023-07-28 DIAGNOSIS — E785 Hyperlipidemia, unspecified: Secondary | ICD-10-CM | POA: Diagnosis not present

## 2023-07-29 ENCOUNTER — Encounter: Payer: Self-pay | Admitting: Family Medicine

## 2023-07-29 ENCOUNTER — Other Ambulatory Visit: Payer: Self-pay

## 2023-07-29 ENCOUNTER — Ambulatory Visit

## 2023-07-29 ENCOUNTER — Ambulatory Visit (INDEPENDENT_AMBULATORY_CARE_PROVIDER_SITE_OTHER): Admitting: Family Medicine

## 2023-07-29 VITALS — BP 126/69 | HR 59 | Temp 98.2°F | Wt 210.0 lb

## 2023-07-29 DIAGNOSIS — E039 Hypothyroidism, unspecified: Secondary | ICD-10-CM

## 2023-07-29 DIAGNOSIS — T148XXD Other injury of unspecified body region, subsequent encounter: Secondary | ICD-10-CM | POA: Diagnosis not present

## 2023-07-29 DIAGNOSIS — K4090 Unilateral inguinal hernia, without obstruction or gangrene, not specified as recurrent: Secondary | ICD-10-CM | POA: Diagnosis not present

## 2023-07-29 DIAGNOSIS — L02413 Cutaneous abscess of right upper limb: Secondary | ICD-10-CM | POA: Diagnosis not present

## 2023-07-29 DIAGNOSIS — I10 Essential (primary) hypertension: Secondary | ICD-10-CM

## 2023-07-29 DIAGNOSIS — J387 Other diseases of larynx: Secondary | ICD-10-CM

## 2023-07-29 MED ORDER — LEVOTHYROXINE SODIUM 25 MCG PO TABS: 12.5000 ug | ORAL_TABLET | Freq: Every day | ORAL | 0 refills | Status: DC

## 2023-07-29 NOTE — Progress Notes (Signed)
 Established Patient Office Visit  Subjective  Patient ID: Cory Hess, male    DOB: 12/13/1945  Age: 78 y.o. MRN: [id number]  Chief Complaint  Patient presents with   Hypertension   Coronary Artery Disease   WOUND: he reports it is healing, still changing his arm bandage once daily   LABILE BLOOD PRESSURE: history of CABG x3 Not as low today as at previous OV  Upcoming appt with cardiology   THYROID : starting Synthroid  today   L GROIN PAIN: bulging into his groin, reports he can push it back in  Doesn't have any fever/chills, N/V  ROS: see HPI     Objective:      BP 126/69   Pulse (!) 59   Temp 98.2 F (36.8 C) (Oral)   Wt 210 lb (95.3 kg)   BMI 28.48 kg/m  BP Readings from Last 3 Encounters:  07/29/23 126/69  07/25/23 (!) 95/53  07/17/23 (!) 153/79     Physical Exam Vitals reviewed.  Constitutional:      Appearance: Normal appearance.  Cardiovascular:     Rate and Rhythm: Normal rate and regular rhythm.     Pulses: Normal pulses.     Heart sounds: Normal heart sounds.  Pulmonary:     Effort: Pulmonary effort is normal.     Breath sounds: Normal breath sounds.  Neurological:     Mental Status: He is alert.  Psychiatric:        Mood and Affect: Mood normal.        Behavior: Behavior normal.       Assessment & Plan:   1. Acquired hypothyroidism (Primary) Discussed recent thyroid  lab work and importance of starting Synthroid . Will start patient on low dose Synthroid  12.5 mcg total. Counseled patient how and when to take medication. Plan to repeat thyroid  function in 6-8 weeks.  - levothyroxine  (SYNTHROID ) 25 MCG tablet; Take 0.5 tablets (12.5 mcg total) by mouth daily before breakfast.  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 Does not feel like goiter. Will obtain ultrasound and determine  necessary further evaluation and management.  - US  Soft Tissue Head/Neck (NON-THYROID ); Future  3. Left groin hernia Patient reports noticing a bulge in his left groin that has become more bothersome over the past 4 weeks. Denies fever/chills, N/V, lower groin pain. Physical exam consistent with left inguinal hernia. MRI ordered to determine severity of likely hernia.  - MR PELVIS WO CONTRAST; Future  4. Essential hypertension Currently taking Coreg  12.5mg  twice daily and still holding losartan  100mg  daily. Due to extensive cardiac history, cardiology referral placed and patient has upcoming appt on 08/11/2023.   5. Abscess of right arm Picture placed in chart. Patient had an IV infiltration while he was hospitalized. He has been continuing to change the dressing daily and use Santyl on necrotic tissue. Similar in appearance to previous office visit. Advised patient to continue to perform sterile dressing changes. Plan for wound care nurse to do home health visit once weekly. Will closely monitor and have patient return in 2 weeks for wound check.   6. Wound healing, delayed Patient has open wound from previous PEG tube on his LUQ that has less crusting present. No signs of infection and no pus drainage. Advised patient to continue using triple antibiotic ointment and keep area covered. Will follow-up in 2 weeks.  Return in about 2 weeks (around 08/12/2023) for wound .    Wilhelmena Hanson, FNP

## 2023-07-29 NOTE — Patient Instructions (Signed)
 Please take Synthroid 12.5 mcg daily BEFORE breakfast and WITHOUT other medications  Continue to take care of wounds and let me know if you develop fever/chills, body aches, low blood pressure, high heart rate.

## 2023-07-31 ENCOUNTER — Other Ambulatory Visit: Payer: Self-pay

## 2023-08-01 ENCOUNTER — Telehealth: Payer: Self-pay

## 2023-08-01 ENCOUNTER — Ambulatory Visit: Admitting: Family Medicine

## 2023-08-01 ENCOUNTER — Other Ambulatory Visit: Payer: Self-pay | Admitting: Family Medicine

## 2023-08-01 NOTE — Telephone Encounter (Signed)
 Patient came in the office wanting to let the provider know that he is in really bad pain on his left groin. He stated that he was never told if he did have a hernia or not (ultrasound and MRI are scheduled for 08/03/2023). Patient would like to know what to do for his pain. Please advise.

## 2023-08-02 ENCOUNTER — Telehealth: Payer: Self-pay

## 2023-08-02 DIAGNOSIS — I5023 Acute on chronic systolic (congestive) heart failure: Secondary | ICD-10-CM | POA: Diagnosis not present

## 2023-08-02 DIAGNOSIS — J387 Other diseases of larynx: Secondary | ICD-10-CM | POA: Insufficient documentation

## 2023-08-02 DIAGNOSIS — L02413 Cutaneous abscess of right upper limb: Secondary | ICD-10-CM | POA: Insufficient documentation

## 2023-08-02 DIAGNOSIS — E039 Hypothyroidism, unspecified: Secondary | ICD-10-CM | POA: Insufficient documentation

## 2023-08-02 DIAGNOSIS — K219 Gastro-esophageal reflux disease without esophagitis: Secondary | ICD-10-CM | POA: Diagnosis not present

## 2023-08-02 DIAGNOSIS — M1990 Unspecified osteoarthritis, unspecified site: Secondary | ICD-10-CM | POA: Diagnosis not present

## 2023-08-02 DIAGNOSIS — K4090 Unilateral inguinal hernia, without obstruction or gangrene, not specified as recurrent: Secondary | ICD-10-CM | POA: Insufficient documentation

## 2023-08-02 DIAGNOSIS — J9602 Acute respiratory failure with hypercapnia: Secondary | ICD-10-CM | POA: Diagnosis not present

## 2023-08-02 DIAGNOSIS — I110 Hypertensive heart disease with heart failure: Secondary | ICD-10-CM | POA: Diagnosis not present

## 2023-08-02 DIAGNOSIS — T8189XD Other complications of procedures, not elsewhere classified, subsequent encounter: Secondary | ICD-10-CM | POA: Diagnosis not present

## 2023-08-02 NOTE — Telephone Encounter (Signed)
 Copied from CRM [id number]. Topic: Clinical - Home Health Verbal Orders >> Aug 02, 2023 10:20 AM [place] wrote: Caller/Agency: Bridgette Campus with [facility] Push Number: [phone] Service Requested: wound care Frequency: 1x week Any new concerns about the patient? Yes- wound not healing well from tube removal and right arm wound care, arm is looking better need calcium  alginate for wound care. He has an inguinal hernia on left side that is very painful and sticking out

## 2023-08-03 ENCOUNTER — Telehealth: Payer: Self-pay

## 2023-08-03 ENCOUNTER — Ambulatory Visit
Admission: RE | Admit: 2023-08-03 | Discharge: 2023-08-03 | Disposition: A | Discharge status: Home / Self Care | Source: Ambulatory Visit | Attending: Family Medicine | Admitting: Family Medicine

## 2023-08-03 DIAGNOSIS — K4090 Unilateral inguinal hernia, without obstruction or gangrene, not specified as recurrent: Secondary | ICD-10-CM | POA: Insufficient documentation

## 2023-08-03 DIAGNOSIS — R221 Localized swelling, mass and lump, neck: Secondary | ICD-10-CM | POA: Diagnosis not present

## 2023-08-03 DIAGNOSIS — K4020 Bilateral inguinal hernia, without obstruction or gangrene, not specified as recurrent: Secondary | ICD-10-CM | POA: Diagnosis not present

## 2023-08-03 DIAGNOSIS — J387 Other diseases of larynx: Secondary | ICD-10-CM | POA: Diagnosis not present

## 2023-08-03 DIAGNOSIS — N3289 Other specified disorders of bladder: Secondary | ICD-10-CM | POA: Diagnosis not present

## 2023-08-03 NOTE — Telephone Encounter (Signed)
 Jennifer from [employer] called us  wanting orders for patient to get medical supplies for his wound care. She also wanted to know what the provider was going to do for his inguinal pain. I let her know that the patient was scheduled to have an ultrasound and MRI today. She stated that the patient did not mention anything to her about that. I told her that when he was here last the information was given to the patient. Coming back to her first concern, I let her know that we had not received a message from them and it's not that we weren't doing our job. I let her know that the message was written in the patient's chart but was never sent to us . But once we clarified everything, I asked if she was able to send us  an order form with what she is needing to care for the patient's wound, then I would provide it the provider to sign. Bridgette Campus agreed and stated that she would go ahead and create it and send it through. I also let her know that I looked into the patient's appointment and saw that he did not show up for his ultrasound and MRI. Bridgette Campus then stated that she would call the patient and ask if they could reschedule it for him. This would let the provider know if he does have an inguinal hernia or not and if he did, to refer him to general surgery. Bridgette Campus understood and had no further questions.

## 2023-08-04 ENCOUNTER — Ambulatory Visit: Payer: Self-pay | Admitting: Family Medicine

## 2023-08-04 ENCOUNTER — Other Ambulatory Visit

## 2023-08-04 NOTE — Telephone Encounter (Signed)
 I will reach out to his Oncologist office to set him a follow up appointment. Then I will call the patient.

## 2023-08-04 NOTE — Telephone Encounter (Signed)
-----   Message from Wilhelmena Hanson sent at 08/04/2023 11:27 AM EDT ----- Please call patient and let him know that the ultrasound of the mass on his neck did not show any acute abnormalities. I recommend that he follow-up with his cancer specialist. Does he need  assistance scheduling this (he has no follow-ups scheduled with them)?  ----- Message ----- From: Interface, Rad Results In Sent: 08/04/2023   8:01 AM EDT To: Wilhelmena Hanson, FNP

## 2023-08-05 ENCOUNTER — Other Ambulatory Visit: Payer: Self-pay | Admitting: Family Medicine

## 2023-08-05 DIAGNOSIS — K4021 Bilateral inguinal hernia, without obstruction or gangrene, recurrent: Secondary | ICD-10-CM

## 2023-08-06 ENCOUNTER — Other Ambulatory Visit: Payer: Self-pay

## 2023-08-09 ENCOUNTER — Telehealth: Payer: Self-pay

## 2023-08-09 DIAGNOSIS — T8189XD Other complications of procedures, not elsewhere classified, subsequent encounter: Secondary | ICD-10-CM | POA: Diagnosis not present

## 2023-08-09 DIAGNOSIS — K219 Gastro-esophageal reflux disease without esophagitis: Secondary | ICD-10-CM | POA: Diagnosis not present

## 2023-08-09 DIAGNOSIS — J9602 Acute respiratory failure with hypercapnia: Secondary | ICD-10-CM | POA: Diagnosis not present

## 2023-08-09 DIAGNOSIS — M1990 Unspecified osteoarthritis, unspecified site: Secondary | ICD-10-CM | POA: Diagnosis not present

## 2023-08-09 DIAGNOSIS — I5023 Acute on chronic systolic (congestive) heart failure: Secondary | ICD-10-CM | POA: Diagnosis not present

## 2023-08-09 DIAGNOSIS — I110 Hypertensive heart disease with heart failure: Secondary | ICD-10-CM | POA: Diagnosis not present

## 2023-08-09 NOTE — Telephone Encounter (Signed)
 Patient has an appointment with his Oncologist on 08/23/2023, Patient informed and aware of appointment.

## 2023-08-09 NOTE — Telephone Encounter (Signed)
 Home health called stating that the patient continues to have pain on his groin and would want for him to be referred to have it repaired. She also wants an order for him to have some type of medication or support for his groin pain. Please advise.

## 2023-08-11 ENCOUNTER — Encounter: Payer: Self-pay | Admitting: Nurse Practitioner

## 2023-08-11 ENCOUNTER — Ambulatory Visit: Attending: Nurse Practitioner | Admitting: Nurse Practitioner

## 2023-08-11 ENCOUNTER — Ambulatory Visit (INDEPENDENT_AMBULATORY_CARE_PROVIDER_SITE_OTHER): Admitting: General Surgery

## 2023-08-11 ENCOUNTER — Encounter: Payer: Self-pay | Admitting: General Surgery

## 2023-08-11 VITALS — BP 99/67 | HR 64 | Ht 72.0 in | Wt 209.0 lb

## 2023-08-11 VITALS — BP 118/58 | HR 62 | Ht 72.0 in | Wt 209.6 lb

## 2023-08-11 DIAGNOSIS — E785 Hyperlipidemia, unspecified: Secondary | ICD-10-CM

## 2023-08-11 DIAGNOSIS — I2699 Other pulmonary embolism without acute cor pulmonale: Secondary | ICD-10-CM

## 2023-08-11 DIAGNOSIS — I5032 Chronic diastolic (congestive) heart failure: Secondary | ICD-10-CM

## 2023-08-11 DIAGNOSIS — I10 Essential (primary) hypertension: Secondary | ICD-10-CM | POA: Diagnosis not present

## 2023-08-11 DIAGNOSIS — I454 Nonspecific intraventricular block: Secondary | ICD-10-CM

## 2023-08-11 DIAGNOSIS — I255 Ischemic cardiomyopathy: Secondary | ICD-10-CM | POA: Diagnosis not present

## 2023-08-11 DIAGNOSIS — Z01810 Encounter for preprocedural cardiovascular examination: Secondary | ICD-10-CM | POA: Diagnosis not present

## 2023-08-11 DIAGNOSIS — I25810 Atherosclerosis of coronary artery bypass graft(s) without angina pectoris: Secondary | ICD-10-CM

## 2023-08-11 DIAGNOSIS — K4090 Unilateral inguinal hernia, without obstruction or gangrene, not specified as recurrent: Secondary | ICD-10-CM | POA: Diagnosis not present

## 2023-08-11 MED ORDER — FAMOTIDINE 20 MG PO TABS: 20.0000 mg | ORAL_TABLET | Freq: Every day | ORAL | 3 refills | Status: DC

## 2023-08-11 MED ORDER — LOSARTAN POTASSIUM 25 MG PO TABS: 25.0000 mg | ORAL_TABLET | Freq: Every day | ORAL | 3 refills | Status: DC

## 2023-08-11 MED ORDER — CARVEDILOL 6.25 MG PO TABS: 6.2500 mg | ORAL_TABLET | Freq: Two times a day (BID) | ORAL | 3 refills | Status: DC

## 2023-08-11 NOTE — Patient Instructions (Signed)
 Medication Instructions:   Your physician recommends the following medication changes.  START TAKING:  Losartan  25 mg by mouth daily  DECREASE:  Carvedilol  6.25 mg by mouth twice a day  In addition, continue all other medications as directed   *If you need a refill on your cardiac medications before your next appointment, please call your pharmacy*  Lab Work:  No labs ordered today   If you have labs (blood work) drawn today and your tests are completely normal, you will receive your results only by: MyChart Message (if you have MyChart) OR A paper copy in the mail If you have any lab test that is abnormal or we need to change your treatment, we will call you to review the results.  Testing/Procedures:  No test ordered today   Follow-Up: At [facility], you and your health needs are our priority.  As part of our continuing mission to provide you with exceptional heart care, our providers are all part of one team.  This team includes your primary Cardiologist (physician) and Advanced Practice Providers or APPs (Physician Assistants and Nurse Practitioners) who all work together to provide you with the care you need, when you need it.  Your next appointment:   1 month(s)  Provider:   You may see Sammy Crisp, MD or  Laneta Pintos, NP

## 2023-08-11 NOTE — Progress Notes (Addendum)
 Office Visit    Patient Name: Cory Hess Date of Encounter: 08/11/2023  Primary Care Provider:  Towana Small, FNP Primary Cardiologist:  Lonni Hanson, MD  Chief Complaint    78 y.o. male  with history of CAD status post CABG x 3 in July 2018, hypertension, hyperlipidemia, alternating bundle branch block, ischemic cardiomyopathy, and HFrEF, who presents for CAD and heart failure follow-up.   Past Medical History   Subjective   Past Medical History:  Diagnosis Date   Alternating RBBB & LBBB    Ankle swelling 2024   Arthritis    neck   Bladder tumor    CAD (coronary artery disease)    a. 08/2017 STEMI/Cath: LM 80ost/d, LAD 100p, LCX 95 ost/p, RCA 60p, 80m, RPDA 80ost; b. 08/2017 CABG x 3: LIMA->LAD, VG->OM, VG->PDA; c. 03/2023 Cardiac Arrest/Cath: Occluded VG->RPDA, patent LIMA->LAD and VG->pLCX. No new native dzs->Med rx. EF 25-35%.   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 08/2017 TEE: EF 30-35%, sept/ant/inf HK, apical AK. 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 12/2017 Echo: EF 45-50%, diff HK; c. 09/2022 Echo: EF 55-60%; d. 03/2023 Echo (in settng of PE): EF 40-45%, GrI DD (imaging poor); e. 03/2023 Echo w/ definity : EF 55-60%, nl RV fxn.   Hyperlipidemia LDL goal <70    Ischemic cardiomyopathy    a. 08/2017 TEE: EF 30-35%; b. 12/2017 Echo: EF 45-50%; c. 09/2022 Echo: EF 55-60%.   Myocardial infarction (HCC) 08/27/2017   Pulmonary embolism (HCC)    a. 03/2023 Cardiac Arrest/PE req thrombectomy - CTA: Moderate right sided arterial clot burden with occlusive clot in the right upper lobe anterior and posterior segmental arteries and a nonoccluding thrombus in the right lower lobe main artery and superior and posterior basal segment arteries.   Shingles    2018 - right side   Vertigo    several months ago   Past Surgical History:  Procedure Laterality Date    CARDIAC CATHETERIZATION     CATARACT EXTRACTION W/PHACO Left 07/18/2018   Procedure: CATARACT EXTRACTION PHACO AND INTRAOCULAR LENS PLACEMENT (IOC)  LEFT;  Surgeon: Myrna Adine Anes, MD;  Location: [facility];  Service: Ophthalmology;  Laterality: Left;   CATARACT EXTRACTION W/PHACO Right 08/14/2018   Procedure: CATARACT EXTRACTION PHACO AND INTRAOCULAR LENS PLACEMENT (IOC)  RIGHT;  Surgeon: Myrna Adine Anes, MD;  Location: [facility];  Service: Ophthalmology;  Laterality: Right;   CORONARY ARTERY BYPASS GRAFT N/A 08/27/2017   Procedure: CORONARY ARTERY BYPASS GRAFTING (CABG) ON PUMP USING LEFT INTERNAL MAMMARY ARTERY AND LEFT GREATER SAPHENOUS VEIN VIA ENDOVEIN HARVEST;  Surgeon: Lucas Dorise POUR, MD;  Location: [facility] OR;  Service: Open Heart Surgery;  Laterality: N/A;   CORONARY/GRAFT ACUTE MI REVASCULARIZATION N/A 08/27/2017   Procedure: Coronary/Graft Acute MI Revascularization;  Surgeon: Darron Deatrice LABOR, MD;  Location: [facility];  Service: Cardiovascular;  Laterality: N/A;   CYSTOSCOPY W/ RETROGRADES Bilateral 12/25/2018   Procedure: CYSTOSCOPY WITH RETROGRADE PYELOGRAM;  Surgeon: Penne Knee, MD;  Location: [facility] OR[facility];  Service: Urology;  Laterality: Bilateral;   FRACTURE SURGERY Right 1958   arm and left wrist compound fracture, no metal   HERNIA REPAIR Right    inguinial   IR IMAGING GUIDED PORT INSERTION  10/15/2022   LEFT HEART CATH AND CORONARY ANGIOGRAPHY N/A 08/27/2017   Procedure: LEFT HEART  CATH AND CORONARY ANGIOGRAPHY;  Surgeon: Darron Deatrice LABOR, MD;  Location: [facility];  Service: Cardiovascular;  Laterality: N/A;   PULMONARY THROMBECTOMY Bilateral 04/18/2023   Procedure: PULMONARY THROMBECTOMY;  Surgeon: Marea Selinda RAMAN, MD;  Location: [facility];  Service: Cardiovascular;  Laterality: Bilateral;   RIGHT/LEFT HEART CATH AND CORONARY/GRAFT ANGIOGRAPHY N/A 04/05/2023   Procedure: RIGHT/LEFT HEART CATH AND CORONARY/GRAFT ANGIOGRAPHY;   Surgeon: Mady Bruckner, MD;  Location: [facility];  Service: Cardiovascular;  Laterality: N/A;   TEMPORARY PACEMAKER N/A 04/05/2023   Procedure: TEMPORARY PACEMAKER;  Surgeon: Mady Bruckner, MD;  Location: [facility];  Service: Cardiovascular;  Laterality: N/A;   TRANSURETHRAL RESECTION OF BLADDER TUMOR WITH MITOMYCIN -C N/A 12/25/2018   Procedure: TRANSURETHRAL RESECTION OF BLADDER TUMOR WITH Gemcitabine ;  Surgeon: Penne Knee, MD;  Location: [facility];  Service: Urology;  Laterality: N/A;    Allergies  No Known Allergies     History of Present Illness      78 y.o. y/o male with above past medical history including hypertension, hyperlipidemia, CAD, alternating bundle branch block, and ischemic cardiomyopathy/HFrEF.  In July 2019, he was admitted with chest pain and new left bundle branch block.  Catheterization showed multivessel CAD and following intra-aortic balloon pump placement, he was transferred to [facility] underwent CABG x 3.  Intraoperative TEE showed an EF of 30 to 35%.  Follow-up echo November 2019, showed improvement in LV function to 45-50%.  He was maintained on Plavix  following his CABG however, this was discontinued in September 2020 in the setting of hematuria and finding of bladder tumor.  He subsequently underwent TURBT in November 2020.  Most recent echocardiogram in August 2024 showed EF of 55 to 60% with moderate LVH and grade 1 diastolic dysfunction.  No significant valvular disease was noted.   In August 2024, he developed lower extremity edema and was placed on torsemide  therapy with improvement in edema.  At the time, he was also undergoing radiation and chemotherapy cancer at the base of his tongue.  Torsemide  was reduced to an as-needed basis.  In February 2025, patient was admitted with out-of-hospital PEA/cardiac arrest.  Emergent diagnostic catheterization revealed patent LIMA to the LAD and vein graft to the proximal left circumflex,  with occlusion of the vein graft to the RPDA, no native left main and LAD occlusion, proximal circumflex disease, and severe diffuse RCA disease.  EF was 25 to 35% by ventriculography.  Filling pressures were moderately to severely elevated (right greater than left) and in the setting of bradycardia, temporary wire was placed.  No intervention was required.  CTA of the chest was performed and showed an occlusive clot in the right upper lobe anterior and posterior segmental arteries with nonoccluding thrombus in the right lower lobe main artery and superior posterior basal segmental arteries.  He required thrombectomy with subsequent development of Pseudomonas pneumonia and difficulty weaning from ventilator requiring placement of a tracheostomy.  Echocardiogram initially showed an EF of 40 to 45% though follow-up echo later in admission showed EF of 55 to 60% with normal RV function.  He was eventually discharged to long-term acute care facility on subcutaneous Lovenox .  Patient says that he was at long-term acute care facility for approximately 2 months and then subsequently went to rehab where he was finally discharged about just over a month ago.  He has very limited recollection of the events that occurred during his hospitalization and was unaware that he had a pulmonary  embolism.  Notes indicate that he was d/c'd from rehab on eliquis .   Cory Hess has been having abdominal discomfort ever since his G-tube removal approximately 2 months ago.  He continues to see wound care due to incomplete healing of the wound at the site of the former G-tube.  He has also had delayed healing of the  right antecubital space.  He was seen in the ED on Jul 17, 2023 due to abdominal discomfort.  Workup was completed including a CTA of the chest which was negative for PE or other acute intrathoracic process, as well as CT of the abdomen and pelvis without acute findings and stable, nonobstructing 3 mm left renal calculus.  Lab  work was unremarkable and he was subsequently discharged home.  He lives locally, by himself.  He does not routinely exercise but with is capable of completing house chores, walking 2 flights or steps, and performing some yard work w/o symptoms or limitations.  He denies chest pain, palpitations, dyspnea, pnd, orthopnea, n, v, dizziness, syncope, edema, weight gain, or early satiety.  He does have some lower abd discomfort associated w/a hernia, for which he is pending surgical evaluation.  He is not currently on oral anticoagulation and he isn't sure that he was ever on it when he left rehab or when it might have been stopped. Objective   Home Medications    Current Outpatient Medications  Medication Sig Dispense Refill   acetaminophen  (TYLENOL ) 325 MG tablet Place 2 tablets (650 mg total) into feeding tube every 4 (four) hours as needed for headache or mild pain (pain score 1-3).     aspirin  81 MG chewable tablet Place 1 tablet (81 mg total) into feeding tube daily.     atorvastatin  (LIPITOR ) 80 MG tablet Place 1 tablet (80 mg total) into feeding tube daily.     Blood Pressure Monitoring (BLOOD PRESSURE KIT) KIT 1 each by Does not apply route in the morning, at noon, and at bedtime. 1 kit 0   levothyroxine  (SYNTHROID ) 25 MCG tablet Take 0.5 tablets (12.5 mcg total) by mouth daily before breakfast. 30 tablet 0   losartan  (COZAAR ) 25 MG tablet Take 1 tablet (25 mg total) by mouth daily. 90 tablet 3   carvedilol  (COREG ) 6.25 MG tablet Take 1 tablet (6.25 mg total) by mouth 2 (two) times daily with a meal. 90 tablet 3   famotidine  (PEPCID ) 20 MG tablet Place 1 tablet (20 mg total) into feeding tube daily. 90 tablet 3   No current facility-administered medications for this visit.     Physical Exam    VS:  BP (!) 118/58 (BP Location: Left Arm, Patient Position: Sitting, Cuff Size: Normal)   Pulse 62   Ht 6' (1.829 m)   Wt 209 lb 9.6 oz (95.1 kg)   SpO2 98%   BMI 28.43 kg/m  , BMI Body mass  index is 28.43 kg/m.       GEN: Well nourished, well developed, in no acute distress. HEENT: normal. Neck: Supple, no JVD, carotid bruits, or masses. Cardiac: RRR, no murmurs, rubs, or gallops. No clubbing, cyanosis, trace bilat LE edema.  Radials 2+/PT 2+ and equal bilaterally.  Respiratory:  Respirations regular and unlabored, clear to auscultation bilaterally. GI: Soft, nontender, nondistended, BS + x 4. Dsg to LUQ - dry/intact. MS: no deformity or atrophy. Skin: warm and dry, no rash.  Dst to R antecubital space dry/intact. Neuro:  Strength and sensation are intact. Psych: Normal affect.  Accessory  Clinical Findings    ECG personally reviewed by me today - EKG Interpretation Date/Time:  Thursday August 11 2023 10:05:46 EDT Ventricular Rate:  62 PR Interval:  218 QRS Duration:  162 QT Interval:  450 QTC Calculation: 456 R Axis:   -84  Text Interpretation: Sinus rhythm with 1st degree A-V block Left axis deviation Right bundle branch block Septal infarct , age undetermined Confirmed by Vivienne Bruckner [phone]) on 08/11/2023 10:19:15 AM   - no acute changes.  Lab Results  Component Value Date   WBC 5.4 07/17/2023   HGB 11.3 (L) 07/17/2023   HCT 35.1 (L) 07/17/2023   MCV 93.4 07/17/2023   PLT 108 (L) 07/17/2023   Lab Results  Component Value Date   CREATININE 1.18 07/17/2023   BUN 13 07/17/2023   NA 137 07/17/2023   K 4.1 07/17/2023   CL 105 07/17/2023   CO2 25 07/17/2023   Lab Results  Component Value Date   ALT 12 07/17/2023   AST 19 07/17/2023   ALKPHOS 56 07/17/2023   BILITOT 0.8 07/17/2023   Lab Results  Component Value Date   CHOL 69 04/07/2023   HDL 21 (L) 04/07/2023   LDLCALC 25 04/07/2023   LDLDIRECT 58 12/28/2017   TRIG 138 04/19/2023   CHOLHDL 3.3 04/07/2023    Lab Results  Component Value Date   HGBA1C 5.2 07/06/2023   Lab Results  Component Value Date   TSH 33.900 (H) 07/06/2023       Assessment & Plan    1.  PE/PEA Arrest: Patient  hospitalized February 2025 with cardiac arrest in the setting of PEA and asystole and subsequent finding of occlusive right upper lobe pulmonary embolism requiring thrombectomy.  Hospital course was complicated by ongoing respiratory failure and Pseudomonas pneumonia requiring eventual tracheostomy and subsequent discharged to long-term acute care facility.  Patient was initially on Lovenox  and though he does not have any recollection of this, notes indicate that he was discharged home from rehab last month on Eliquis  5 mg twice daily.  As of June 2, this is no longer showing up on his medication list.  I have reached out to primary care for further clarification as to discontinuation of oral anticoagulation as given unprovoked nature of the event and significant clot burden, I would expect at least 6 months of therapy.  Further recommendations pending.  2.  CAD: Status post CABG x 3 in 2019 with diagnostic catheterization in February 2025 in the setting of cardiac arrest revealing known multivessel occlusive disease with patent LIMA to the LAD, vein graft to the proximal circumflex, and occluded vein graft to the RPDA.  He has been medically managed.  He does not experience chest pain or lifestyle limiting dyspnea.  He remains on aspirin , statin, and beta-blocker therapy.  3.  HFimpEF/ICM: EF previously as low as 35% at the time of his bypass surgery in 2019 with subsequent improvement by echo in August 2024.  In the setting of PEA arrest and pulmonary embolism, initial echo showed an EF of 40 to 45% though it was noted the imaging quality was poor and follow-up limited echo with definitely showed EF 55 to 60%.  He has been feeling well without chest pain or dyspnea.  He does have trace lower extremity edema on exam which he notes is chronic.  He previously took torsemide  as needed and still has some at home.  In light of recent soft blood pressures and adjustments to his beta-blocker and ARB, I  encouraged  reducing his salt intake, keeping his legs elevated, and wearing compression socks, which he has at home.  If pressure stable, he may benefit from low-dose diuretic.  4.  Primary HTN: Blood pressure was recently soft at primary care visit prompting discontinuation of losartan .  Pressure today is 118/58.  He is currently taking carvedilol  12.5 mg twice daily.  Given prior history of cardiomyopathy with improved EF, in order to make room for an ARB, I am reducing his carvedilol  to 6.25 mg twice daily and will resume losartan  25 mg daily.  5.  HL: On atorvastatin  therapy with LDL of 25 earlier this year with normal LFTs in May.  6.  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  Patient briefly required temporary wire in the setting of PEA and asystolic arrest in February.  Sinus rhythm with first-degree AV block, right bundle branch block, left axis deviation today -overall unchanged.  Reducing ? blocker today.  7.  Healing wounds: Patient with ongoing delayed healing of wounds of his left upper abdomen and right antecubital space.  He is being followed by wound care.  8.  Abdominal hernia/preoperative cardiovascular evaluation:  pending surgical eval.  As above, he has generally done well from a cardiovascular standpoint and is capable of achieving at least 4 metabolic equivalents without symptoms or limitations.  Catheterization earlier this year with 2 of 3 patent grafts and known multivessel disease.  Echo earlier this year with normal LV function.  Considering prior cardiac history, his RCRI calculates to 11% however, in the absence of symptoms, he does not require further ischemic evaluation prior to surgery.   9.  Disposition: Follow-up in 1 month.  Lonni Meager, NP 08/11/2023, 5:31 PM  ADDENDUM:  Discussed w/ Pts PCP.  No clear reasoning behind discontinuation of eliquis  following PE in 03/2023.  Records indicate that  he was d/c'd from rehab on eliquis , though he doesn't know when/why he stopped it.  We agreed to resume eliquis  5 mg bid.  I have contacted Cory Hess and discussed with him.  Rx for eliquis  5mg  BID sent to his pharmacy.  I will refer back to his hematologist to discuss further hypercoag w/u. 08/15/2023, 5:48 PM  Lonni Meager, NP

## 2023-08-11 NOTE — Patient Instructions (Addendum)
 We will consult with our other surgeons and see if any of them are willing to do your surgery laparoscopically.   Follow-up with our office in one month.   Please call and ask to speak with a nurse if you develop questions or concerns.   Groin Hernia (Inguinal Hernia) in Adults: What to Know  A hernia happens when an organ or tissue in your body pushes out through a weak spot in the muscles of your belly. This makes a bulge. A groin hernia is also called an inguinal hernia. It's found in your groin, which is the area where your leg meets your lower belly. This kind of hernia could also be: In your scrotum, if you're male. In the folds of skin around your vagina, if you're male. You may be able to push the bulge back into your belly. If you can't push it in and blood flow is cut off to the hernia, you'll need surgery right away. What are the causes? A groin hernia may happen when you strain your belly muscles, such as when you: Lift a heavy object. Strain to poop. Cough. What increases the risk? You may be more likely to get a groin hernia if: You're male. You're 50 years or older. You're pregnant. You've had a groin hernia or belly surgery before. You smoke. You're overweight. You work at a job where you need to stand a lot or lift heavy things. What are the signs or symptoms? Symptoms may depend on how big the hernia is. If it's small, you may not have symptoms. If it's bigger, you may have: A bulge near your groin or genitals. Pain or burning in your groin. A dull ache or feeling of pressure in your groin. If blood flow is cut off to the tissues inside the hernia, you may also: Feel pain and tenderness when you touch the bulge. The skin over it may turn red or purple. Have a fever. Throw up or feel like you may throw up. Have trouble pooping or passing gas. How is this treated? Treatment depends on how big the hernia is and what symptoms you have. You may need: To be  watched to see if the bulge grows bigger. Surgery. This may be done if the hernia is big or if you have symptoms. Follow these instructions at home: Lifestyle Ask if it's OK for you to lift. Try not to stand for long periods of time. Do not smoke, vape, or use nicotine or tobacco. Stay at a healthy weight. Try not to do things that put pressure on your hernia. Preventing trouble pooping You may need to take these steps to help prevent or treat trouble pooping (constipation): Take medicines to help you poop. Eat foods high in fiber, like beans, whole grains, and fresh fruits and vegetables. Drink more fluids as told. General instructions Try to push the hernia back in place by very gently pressing on it while lying down. Do not try to force it back in if it won't push in easily. Watch your hernia for any changes in: Shape. Size. Color. Take your medicines only as told. Contact a doctor if: You have a fever. You have new symptoms. Your symptoms get worse. You can't poop or pass gas. Get help right away if: Your bulge: Starts to hurt a lot. Changes color. You have sudden pain in your scrotum, or your scrotum changes size. You can't gently push the hernia back in place. You feel like you may vomit, and that  feeling does not go away. You keep throwing up or feeling like you need to throw up. These symptoms may be an emergency. Call 911 right away. Do not wait to see if the symptoms will go away. Do not drive yourself to the hospital. This information is not intended to replace advice given to you by your health care provider. Make sure you discuss any questions you have with your health care provider. Document Revised: 10/07/2022 Document Reviewed: 10/07/2022 Elsevier Patient Education  2024 [employer].

## 2023-08-12 ENCOUNTER — Ambulatory Visit
Admission: EM | Admit: 2023-08-12 | Discharge: 2023-08-12 | Disposition: A | Discharge status: Home / Self Care | Attending: Physician Assistant | Admitting: Physician Assistant

## 2023-08-12 ENCOUNTER — Other Ambulatory Visit: Payer: Self-pay

## 2023-08-12 ENCOUNTER — Telehealth: Payer: Self-pay | Admitting: Family Medicine

## 2023-08-12 DIAGNOSIS — K4090 Unilateral inguinal hernia, without obstruction or gangrene, not specified as recurrent: Secondary | ICD-10-CM | POA: Diagnosis not present

## 2023-08-12 MED ORDER — HYDROCODONE-ACETAMINOPHEN 5-325 MG PO TABS: 1.0000 | ORAL_TABLET | Freq: Three times a day (TID) | ORAL | 0 refills | Status: DC | PRN

## 2023-08-12 NOTE — Telephone Encounter (Signed)
 Patient advised to seek care at the to [facility] until he can see his provider on Monday.

## 2023-08-12 NOTE — ED Triage Notes (Signed)
 Pt c/o hernia x1week  Pt states that the hernia is in his left groin and is the size of a hen egg and is painful to touch.

## 2023-08-12 NOTE — Discharge Instructions (Addendum)
-   I have given you a short supply of pain medication.  Make sure you are not taking Tylenol  at the same time you are taking the Norco which is made up of hydrocodone and Tylenol . - Keep your follow-up appointment with your doctor in a few days. - If your pain acutely worsens, you have hardened abdomen, stop having bowel movements, noticed blood in your stool, feel weak, have fever or severe pain please go to the emergency department. - Keep your appointment with general surgery.

## 2023-08-12 NOTE — Telephone Encounter (Signed)
 Patient walked in and stated he needs a prescription called in for pain. Has a hernia and it is really painful. Last OV 08/01/2023

## 2023-08-12 NOTE — ED Provider Notes (Signed)
 [facility]    CSN: [id number] Arrival date & time: 08/12/23  1407      History   Chief Complaint Chief Complaint  Patient presents with   Hernia    HPI Cory Hess is a 78 y.o. male presenting for left inguinal hernia pain.  Patient has been experiencing discomfort for the past week.  He says he actually saw general surgery yesterday for surgery consult.  He states they wanted to do an open inguinal hernia surgery on him but he would rather have a laparoscopic surgery.  He states he has an appointment coming up in a few weeks to discuss his other options for surgery.  He has been taking Tylenol  for the pain but it has not been helping so much.  He would like something stronger.  He has an appointment to see his PCP in 3 days but did not want to go the whole weekend having a lot of discomfort.  He denies any change in bowel habits, blood in stool, overlying redness/bruising of the area and has not had any worsening pain recently.  HPI  Past Medical History:  Diagnosis Date   Alternating RBBB & LBBB    Ankle swelling 2024   Arthritis    neck   Bladder tumor    CAD (coronary artery disease)    a. 08/2017 STEMI/Cath: LM 80ost/d, LAD 100p, LCX 95 ost/p, RCA 60p, 56m, RPDA 80ost; b. 08/2017 CABG x 3: LIMA->LAD, VG->OM, VG->PDA; c. 03/2023 Cardiac Arrest/Cath: Occluded VG->RPDA, patent LIMA->LAD and VG->pLCX. No new native dzs->Med rx. EF 25-35%.   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 08/2017 TEE: EF 30-35%, sept/ant/inf HK, apical AK. 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 12/2017 Echo: EF 45-50%, diff HK; c. 09/2022 Echo: EF 55-60%; d. 03/2023 Echo (in settng of PE): EF 40-45%, GrI DD (imaging poor); e. 03/2023 Echo w/ definity : EF 55-60%, nl RV fxn.   Hyperlipidemia LDL goal <70    Ischemic cardiomyopathy    a. 08/2017 TEE: EF 30-35%; b. 12/2017 Echo: EF 45-50%; c.  09/2022 Echo: EF 55-60%.   Myocardial infarction (HCC) 08/27/2017   Pulmonary embolism (HCC)    a. 03/2023 Cardiac Arrest/PE req thrombectomy - CTA: Moderate right sided arterial clot burden with occlusive clot in the right upper lobe anterior and posterior segmental arteries and a nonoccluding thrombus in the right lower lobe main artery and superior and posterior basal segment arteries.   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   Essential hypertension    Blood glucose elevated 07/06/2023   Does not have primary care provider 07/01/2023   Thrush 07/01/2023   Former smoker 07/01/2023   Pulmonary embolus (HCC) 04/15/2023   Acute respiratory failure with hypoxia and hypercapnia (HCC) 04/10/2023   Acute CVA (cerebrovascular accident) (HCC) 04/09/2023   Hyperkalemia 04/06/2023   Acute kidney injury (HCC) 04/06/2023   Cardiac arrest (HCC) 04/05/2023   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   Acute on chronic HFrEF (heart failure with reduced ejection fraction) (HCC) 04/05/2023   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  Hematuria 11/06/2018   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   Coronary artery disease 08/28/2017   NSTEMI (non-ST elevated myocardial infarction) [facility])     Past Surgical History:  Procedure Laterality Date   CARDIAC CATHETERIZATION     CATARACT EXTRACTION W/PHACO Left 07/18/2018   Procedure: CATARACT EXTRACTION PHACO AND INTRAOCULAR LENS PLACEMENT (IOC)  LEFT;  Surgeon: Rosa College, MD;  Location: [facility];  Service: Ophthalmology;  Laterality: Left;   CATARACT EXTRACTION W/PHACO Right 08/14/2018   Procedure: CATARACT EXTRACTION PHACO AND INTRAOCULAR LENS PLACEMENT (IOC)  RIGHT;  Surgeon: Rosa College, MD;  Location: [facility];  Service:  Ophthalmology;  Laterality: Right;   CORONARY ARTERY BYPASS GRAFT N/A 08/27/2017   Procedure: CORONARY ARTERY BYPASS GRAFTING (CABG) ON PUMP USING LEFT INTERNAL MAMMARY ARTERY AND LEFT GREATER SAPHENOUS VEIN VIA ENDOVEIN HARVEST;  Surgeon: Bartley Lightning, MD;  Location: [facility];  Service: Open Heart Surgery;  Laterality: N/A;   CORONARY/GRAFT ACUTE MI REVASCULARIZATION N/A 08/27/2017   Procedure: Coronary/Graft Acute MI Revascularization;  Surgeon: Wenona Hamilton, MD;  Location: [facility];  Service: Cardiovascular;  Laterality: N/A;   CYSTOSCOPY W/ RETROGRADES Bilateral 12/25/2018   Procedure: CYSTOSCOPY WITH RETROGRADE PYELOGRAM;  Surgeon: Dustin Gimenez, MD;  Location: [facility];  Service: Urology;  Laterality: Bilateral;   FRACTURE SURGERY Right 1958   arm and left wrist compound fracture, no metal   HERNIA REPAIR Right    inguinial   IR IMAGING GUIDED PORT INSERTION  10/15/2022   LEFT HEART CATH AND CORONARY ANGIOGRAPHY N/A 08/27/2017   Procedure: LEFT HEART CATH AND CORONARY ANGIOGRAPHY;  Surgeon: Wenona Hamilton, MD;  Location: [facility];  Service: Cardiovascular;  Laterality: N/A;   PULMONARY THROMBECTOMY Bilateral 04/18/2023   Procedure: PULMONARY THROMBECTOMY;  Surgeon: Celso College, MD;  Location: [facility];  Service: Cardiovascular;  Laterality: Bilateral;   RIGHT/LEFT HEART CATH AND CORONARY/GRAFT ANGIOGRAPHY N/A 04/05/2023   Procedure: RIGHT/LEFT HEART CATH AND CORONARY/GRAFT ANGIOGRAPHY;  Surgeon: Sammy Crisp, MD;  Location: [facility];  Service: Cardiovascular;  Laterality: N/A;   TEMPORARY PACEMAKER N/A 04/05/2023   Procedure: TEMPORARY PACEMAKER;  Surgeon: Sammy Crisp, MD;  Location: [facility];  Service: Cardiovascular;  Laterality: N/A;   TRANSURETHRAL RESECTION OF BLADDER TUMOR WITH MITOMYCIN -C N/A 12/25/2018   Procedure: TRANSURETHRAL RESECTION OF BLADDER TUMOR WITH Gemcitabine ;  Surgeon: Dustin Gimenez, MD;  Location:  [facility];  Service: Urology;  Laterality: N/A;       Home Medications    Prior to Admission medications   Medication Sig Start Date End Date Taking? Authorizing Provider  acetaminophen  (TYLENOL ) 325 MG tablet Place 2 tablets (650 mg total) into feeding tube every 4 (four) hours as needed for headache or mild pain (pain score 1-3). 04/21/23  Yes Delanna Fears, NP  aspirin  81 MG chewable tablet Place 1 tablet (81 mg total) into feeding tube daily. 04/22/23  Yes Cherylann Corpus D, NP  atorvastatin  (LIPITOR ) 80 MG tablet Place 1 tablet (80 mg total) into feeding tube daily. 04/22/23  Yes Cherylann Corpus D, NP  Blood Pressure Monitoring (BLOOD PRESSURE KIT) KIT 1 each by Does not apply route in the morning, at noon, and at bedtime. 07/25/23  Yes Wilhelmena Hanson, FNP  carvedilol  (COREG ) 6.25 MG tablet Take 1 tablet (6.25 mg total) by mouth 2 (two) times daily with a meal. 08/11/23  Yes Florette Hurry, NP  famotidine  (PEPCID ) 20 MG tablet Place 1 tablet (  20 mg total) into feeding tube daily. 08/11/23  Yes Florette Hurry, NP  HYDROcodone-acetaminophen  (NORCO/VICODIN) 5-325 MG tablet Take 1 tablet by mouth every 8 (eight) hours as needed for up to 3 days for severe pain (pain score 7-10). 08/12/23 08/15/23 Yes Nancy Axon B, PA-C  levothyroxine  (SYNTHROID ) 25 MCG tablet Take 0.5 tablets (12.5 mcg total) by mouth daily before breakfast. 07/29/23  Yes Wilhelmena Hanson, FNP  losartan  (COZAAR ) 25 MG tablet Take 1 tablet (25 mg total) by mouth daily. 08/11/23 11/09/23 Yes Florette Hurry, NP    Family History Family History  Problem Relation Age of Onset   Heart failure Mother    Lung disease Father    Stroke Father    Cervical cancer Maternal Grandmother     Social History Social History   Tobacco Use   Smoking status: Former    Current packs/day: 0.00    Types: Cigarettes    Quit date: 2000    Years since quitting: 25.4    Passive exposure: Past   Smokeless  tobacco: Former    Types: Snuff  Vaping Use   Vaping status: Never Used  Substance Use Topics   Alcohol use: Not Currently   Drug use: Not Currently     Allergies   Patient has no known allergies.   Review of Systems Review of Systems  Constitutional:  Negative for fatigue and fever.  Gastrointestinal:  Negative for abdominal pain, blood in stool, constipation, nausea and vomiting.  Musculoskeletal:  Negative for back pain.  Skin:  Negative for color change, rash and wound.  Neurological:  Negative for weakness.     Physical Exam Triage Vital Signs ED Triage Vitals  Encounter Vitals Group     BP 08/12/23 1441 118/75     Girls Systolic BP Percentile --      Girls Diastolic BP Percentile --      Boys Systolic BP Percentile --      Boys Diastolic BP Percentile --      Pulse Rate 08/12/23 1441 (!) 56     Resp --      Temp 08/12/23 1441 97.8 F (36.6 C)     Temp Source 08/12/23 1441 Oral     SpO2 08/12/23 1441 100 %     Weight 08/12/23 1441 205 lb (93 kg)     Height 08/12/23 1441 6' (1.829 m)     Head Circumference --      Peak Flow --      Pain Score 08/12/23 1440 9     Pain Loc --      Pain Education --      Exclude from Growth Chart --    No data found.  Updated Vital Signs BP 118/75 (BP Location: Left Arm)   Pulse (!) 56   Temp 97.8 F (36.6 C) (Oral)   Ht 6' (1.829 m)   Wt 205 lb (93 kg)   SpO2 100%   BMI 27.80 kg/m     Physical Exam Vitals and nursing note reviewed.  Constitutional:      General: He is not in acute distress.    Appearance: Normal appearance. He is well-developed. He is not ill-appearing.  HENT:     Head: Normocephalic and atraumatic.   Eyes:     General: No scleral icterus.    Conjunctiva/sclera: Conjunctivae normal.    Cardiovascular:     Rate and Rhythm: Normal rate and regular rhythm.     Heart sounds: Normal heart sounds.  Pulmonary:     Effort: Pulmonary effort is normal. No respiratory distress.     Breath sounds:  Normal breath sounds.  Abdominal:     Palpations: Abdomen is soft.     Tenderness: There is no abdominal tenderness.     Hernia: A hernia is present. Hernia is present in the left inguinal area.  Genitourinary:    Penis: Normal.      Testes: Normal.      Comments: Large are of swelling and tenderness left inguinal region. Reducible. No guarding or rebound  Musculoskeletal:     Cervical back: Neck supple.   Skin:    General: Skin is warm and dry.     Capillary Refill: Capillary refill takes less than 2 seconds.   Neurological:     Mental Status: He is alert.   Psychiatric:        Mood and Affect: Mood normal.        Behavior: Behavior normal.      UC Treatments / Results  Labs (all labs ordered are listed, but only abnormal results are displayed) Labs Reviewed - No data to display  EKG   Radiology No results found. Study Result  Narrative & Impression  CLINICAL DATA:  Hernia suspected, inguinal or femoral.   EXAM: MRI PELVIS WITHOUT CONTRAST   TECHNIQUE: Multiplanar multisequence MR imaging of the pelvis was performed. No intravenous contrast was administered.   COMPARISON:  CT scan abdomen and pelvis from 07/17/2023.   FINDINGS: Urinary Tract: Urinary bladder is partially distended. There are trabeculations, suggesting sequela of chronic urinary outflow obstruction. No focal mass or filling defect.   Bowel:  Unremarkable visualized pelvic bowel loops.   Vascular/Lymphatic: No pathologically enlarged lymph nodes. No significant vascular abnormality seen.   Reproductive: There is prostatomegaly. Symmetric bilateral seminal vesicles.   Other: There is small-to-moderate left fat containing inguinal hernia and small fat containing right inguinal hernia. No herniation of bowel loops. No femoral hernia seen on either side.   Musculoskeletal: No suspicious bone lesions identified.   IMPRESSION: *Small-to-moderate left and small right fat containing  inguinal hernias. No herniation of bowel loops. No femoral hernia seen on either side. *Prostatomegaly.  Correlate with serum PSA levels.     Electronically Signed   By: Beula Brunswick M.D.   On: 08/04/2023 11:33     Procedures Procedures (including critical care time)  Medications Ordered in UC Medications - No data to display  Initial Impression / Assessment and Plan / UC Course  I have reviewed the triage vital signs and the nursing notes.  Pertinent labs & imaging results that were available during my care of the patient were reviewed by me and considered in my medical decision making (see chart for details).   78 year old male presents for left inguinal hernia pain for the past week.  Patient saw general surgery yesterday for consult and has been recommended to have surgery but he is more interested in having a laparoscopic/robotic surgery instead of an open surgery so we will have another consultation in a couple weeks to discuss his options.  He says Tylenol  is not strong enough for his discomfort.  Denies any bowel changes, blood in stool, fever or weakness.  MR Pelvis: IMPRESSION: *Small-to-moderate left and small right fat containing inguinal hernias. No herniation of bowel loops. No femoral hernia seen on either side. *Prostatomegaly.  Correlate with serum PSA levels.   Incomplete note from general surgery yesterday.  Presentation today consistent with inguinal hernia.  No concern for obstruction/strangulation.  Advised patient I would prescribe 3 days of Norco.  He has follow-up appoint with PCP on 6/23.  Advised to discuss any further need for pain medicine at that time with PCP.  Advised if pain worsens, he has change in bowel movements, fever, fatigue, sweats, nausea/vomiting, blood in stool or black stool to go to the ER.   Final Clinical Impressions(s) / UC Diagnoses   Final diagnoses:  Unilateral inguinal hernia without obstruction or gangrene, recurrence not  specified     Discharge Instructions      - I have given you a short supply of pain medication.  Make sure you are not taking Tylenol  at the same time you are taking the Norco which is made up of hydrocodone and Tylenol . - Keep your follow-up appointment with your doctor in a few days. - If your pain acutely worsens, you have hardened abdomen, stop having bowel movements, noticed blood in your stool, feel weak, have fever or severe pain please go to the emergency department. - Keep your appointment with general surgery.   ED Prescriptions     Medication Sig Dispense Auth. Provider   HYDROcodone-acetaminophen  (NORCO/VICODIN) 5-325 MG tablet Take 1 tablet by mouth every 8 (eight) hours as needed for up to 3 days for severe pain (pain score 7-10). 9 tablet Floydene Hy, PA-C      I have reviewed the PDMP during this encounter.   Floydene Hy, PA-C 08/12/23 [phone]

## 2023-08-14 NOTE — Progress Notes (Signed)
 Patient ID: Jovannie Ulibarri, male   DOB: 31-Jul-1945, 78 y.o.   MRN: [id number] CC: Left Inguinal Hernia History of Present Illness Jamori Biggar is a 78 y.o. male with past medical history as including coronary artery disease status post CABG, heart failure, pulmonary embolism with a COVID event in February presents in consultation for left inguinal hernia.  The patient has a complex medical history and little confused about his course and history.  As pertaining to his hernia he reports that he has noticed some pain in his left groin over the last several months.  He also notes that there is a bulge in the area.  He is able to push the bulge again and when he lays down this will reduce spontaneously.  He denies any overlying skin changes, nausea or vomiting, he continues to have bowel movements but sometimes has constipation.  He has had imaging including a CT scan and an MRI that showed that he has a fat-containing left inguinal hernia.  He reports that he had the right groin hernia repaired laparoscopically 35? Years ago.   In February he had an out-of-hospital cardiac arrest.  He came to the hospital and was taken to the Cath Lab but did not have significant coronary artery disease that was acute so no PCI was done.  This hospital course was complicated by acute decompensated heart failure, hypoxic respiratory failure and then Pseudomonas pneumonia and pulmonary embolism that did require thrombectomy.  He was actually transferred to an LTAC ventilated.  At some point he underwent a G-tube placement likely at the LTAC as I was unable to find records in our system that show that he had a G-tube.  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  He has had a wound over this.  He has also had difficulty with healing his G-tube wound.  It is unclear why he is not on anticoagulation for his pulmonary embolism.  He recently saw his cardiologist.   Past Medical  History Past Medical History:  Diagnosis Date   Alternating RBBB & LBBB    Ankle swelling 2024   Arthritis    neck   Bladder tumor    CAD (coronary artery disease)    a. 08/2017 STEMI/Cath: LM 80ost/d, LAD 100p, LCX 95 ost/p, RCA 60p, 14m, RPDA 80ost; b. 08/2017 CABG x 3: LIMA->LAD, VG->OM, VG->PDA; c. 03/2023 Cardiac Arrest/Cath: Occluded VG->RPDA, patent LIMA->LAD and VG->pLCX. No new native dzs->Med rx. EF 25-35%.   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 08/2017 TEE: EF 30-35%, sept/ant/inf HK, apical AK. 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 12/2017 Echo: EF 45-50%, diff HK; c. 09/2022 Echo: EF 55-60%; d. 03/2023 Echo (in settng of PE): EF 40-45%, GrI DD (imaging poor); e. 03/2023 Echo w/ definity : EF 55-60%, nl RV fxn.   Hyperlipidemia LDL goal <70    Ischemic cardiomyopathy    a. 08/2017 TEE: EF 30-35%; b. 12/2017 Echo: EF 45-50%; c. 09/2022 Echo: EF 55-60%.   Myocardial infarction (HCC) 08/27/2017   Pulmonary embolism (HCC)    a. 03/2023 Cardiac Arrest/PE req thrombectomy - CTA: Moderate right sided arterial clot burden with occlusive clot in the right upper lobe anterior and posterior segmental arteries and a nonoccluding thrombus in the right lower lobe main artery and superior and posterior  basal segment arteries.   Shingles    2018 - right side   Vertigo    several months ago       Past Surgical History:  Procedure Laterality Date   CARDIAC CATHETERIZATION     CATARACT EXTRACTION W/PHACO Left 07/18/2018   Procedure: CATARACT EXTRACTION PHACO AND INTRAOCULAR LENS PLACEMENT (IOC)  LEFT;  Surgeon: Myrna Adine Anes, MD;  Location: [facility];  Service: Ophthalmology;  Laterality: Left;   CATARACT EXTRACTION W/PHACO Right 08/14/2018   Procedure: CATARACT EXTRACTION PHACO AND INTRAOCULAR LENS PLACEMENT (IOC)  RIGHT;  Surgeon: Myrna Adine Anes, MD;  Location: [facility];  Service: Ophthalmology;  Laterality: Right;   CORONARY ARTERY BYPASS GRAFT N/A 08/27/2017   Procedure: CORONARY ARTERY BYPASS GRAFTING (CABG) ON PUMP USING LEFT INTERNAL MAMMARY ARTERY AND LEFT GREATER SAPHENOUS VEIN VIA ENDOVEIN HARVEST;  Surgeon: Lucas Dorise POUR, MD;  Location: [facility] OR;  Service: Open Heart Surgery;  Laterality: N/A;   CORONARY/GRAFT ACUTE MI REVASCULARIZATION N/A 08/27/2017   Procedure: Coronary/Graft Acute MI Revascularization;  Surgeon: Darron Deatrice LABOR, MD;  Location: [facility];  Service: Cardiovascular;  Laterality: N/A;   CYSTOSCOPY W/ RETROGRADES Bilateral 12/25/2018   Procedure: CYSTOSCOPY WITH RETROGRADE PYELOGRAM;  Surgeon: Penne Knee, MD;  Location: [facility];  Service: Urology;  Laterality: Bilateral;   FRACTURE SURGERY Right 1958   arm and left wrist compound fracture, no metal   HERNIA REPAIR Right    inguinial   IR IMAGING GUIDED PORT INSERTION  10/15/2022   LEFT HEART CATH AND CORONARY ANGIOGRAPHY N/A 08/27/2017   Procedure: LEFT HEART CATH AND CORONARY ANGIOGRAPHY;  Surgeon: Darron Deatrice LABOR, MD;  Location: [facility];  Service: Cardiovascular;  Laterality: N/A;   PULMONARY THROMBECTOMY Bilateral 04/18/2023   Procedure: PULMONARY THROMBECTOMY;  Surgeon: Marea Selinda RAMAN, MD;  Location: [facility];  Service: Cardiovascular;  Laterality: Bilateral;   RIGHT/LEFT HEART CATH AND CORONARY/GRAFT ANGIOGRAPHY N/A 04/05/2023   Procedure: RIGHT/LEFT HEART CATH AND CORONARY/GRAFT ANGIOGRAPHY;  Surgeon: Mady Bruckner, MD;  Location: [facility];  Service: Cardiovascular;  Laterality: N/A;   TEMPORARY PACEMAKER N/A 04/05/2023   Procedure: TEMPORARY PACEMAKER;  Surgeon: Mady Bruckner, MD;  Location: [facility];  Service: Cardiovascular;  Laterality: N/A;   TRANSURETHRAL RESECTION OF BLADDER TUMOR WITH MITOMYCIN -C N/A 12/25/2018   Procedure: TRANSURETHRAL RESECTION OF BLADDER TUMOR WITH Gemcitabine ;  Surgeon: Penne Knee, MD;  Location: [facility];  Service: Urology;  Laterality: N/A;    No Known Allergies  Current Outpatient Medications  Medication Sig Dispense Refill   acetaminophen  (TYLENOL ) 325 MG tablet Place 2 tablets (650 mg total) into feeding tube every 4 (four) hours as needed for headache or mild pain (pain score 1-3).     aspirin  81 MG chewable tablet Place 1 tablet (81 mg total) into feeding tube daily.     atorvastatin  (LIPITOR ) 80 MG tablet Place 1 tablet (80 mg total) into feeding tube daily.     Blood Pressure Monitoring (BLOOD PRESSURE KIT) KIT 1 each by Does not apply route in the morning, at noon, and at bedtime. 1 kit 0   carvedilol  (COREG ) 6.25 MG tablet Take 1 tablet (6.25 mg total) by mouth 2 (two) times daily with a meal. 90 tablet 3   famotidine  (PEPCID ) 20 MG tablet Place 1 tablet (20 mg total) into feeding tube daily. 90 tablet 3   levothyroxine  (SYNTHROID ) 25 MCG tablet Take 0.5 tablets (12.5 mcg total) by mouth  daily before breakfast. 30 tablet 0   losartan  (COZAAR ) 25 MG tablet Take 1 tablet (25 mg total) by mouth daily. 90 tablet 3   HYDROcodone -acetaminophen  (NORCO/VICODIN) 5-325 MG tablet Take 1 tablet by mouth every 8 (eight) hours as needed for up to 3 days for severe pain (pain score 7-10). 9 tablet 0   No current facility-administered medications for this visit.    Family History Family History  Problem Relation Age of Onset   Heart failure Mother    Lung disease Father    Stroke Father    Cervical cancer Maternal Grandmother        Social History Social History   Tobacco Use   Smoking status: Former    Current packs/day: 0.00    Types: Cigarettes    Quit date: 2000    Years since quitting: 25.4    Passive exposure: Past   Smokeless tobacco: Former    Types: Snuff  Vaping Use   Vaping status: Never Used  Substance Use Topics   Alcohol use: Not Currently   Drug use: Not Currently        ROS Full ROS of systems performed and is otherwise  negative there than what is stated in the HPI  Physical Exam Blood pressure 99/67, pulse 64, height 6' (1.829 m), weight 209 lb (94.8 kg), SpO2 97%.  Alert and oriented x 3, normal work of breathing on room air, regular rate and rhythm, abdomen is soft, nontender and nondistended.  Below his umbilicus I can see 2 well-healed surgical scars, no evidence of of recurrent hernia in the right groin.  In the left groin there is an easily reducible what feels like fat-containing hernia.  This also reduces spontaneously when he lays down.  In his left upper abdomen there is an area of previous G-tube that is almost all healed.  There is still some granulation tissue but there is no surrounding erythema to suggest infection.  In his right upper extremity there is also another small wound that he says has been healing for the last several months.  There is some granulation tissue but without any evidence of erythema or purulence from it.  Data Reviewed I have extensively reviewed his chart to gather information on his cardiac arrest, subsequent thrombectomy and his course since February.  I have also reviewed his recent imaging including a CT scan as well as an MRI that shows a fat-containing left inguinal hernia.  I have personally reviewed the patient's imaging and medical records.    Assessment    The patient is a 78 year old with left inguinal hernia.  He has a myriad of other health problems.  This includes a recent cardiac arrest in February of this year, pulmonary embolism, congestive heart failure and coronary artery disease.  Plan    I had a long discussion with the patient that given his prior G-tube, open wounds and recent COVID event that I would like to wait at least 6 months from this event and let the wounds heal before I would entertain any surgical intervention.  I also discussed with him that I would not perform a minimally invasive repair and that I would do an open repair so was not to  have to violate the peritoneum given his history of G-tube placement.  I also think that from an anesthesia standpoint this could be done under monitored anesthesia care if we did it through the groin which would be much safer for him.  He has not  interested in having an open repair.  I did discuss with him that I am more than happy to ask my partners if they would entertain doing a minimally invasive repair.  I have discussed this with 2 of my partners so far Dr. Tye Dr. Lane and they have agreed that he is not a candidate for minimally invasive repair.  I will discuss it with my other partners.  I will plan to see him back in 4 weeks.  In the meantime I advised hernia precaution including no heavy lifting and symptoms that may necessitate prompt evaluation by provider including overlying skin changes, irreducible hernia and obstipation.  A total of 65 minutes was spent reviewing the patient's chart, performing a history and physical and discussing treatment options with the patient.   Jayson MALVA Endow

## 2023-08-15 ENCOUNTER — Ambulatory Visit (INDEPENDENT_AMBULATORY_CARE_PROVIDER_SITE_OTHER): Admitting: Family Medicine

## 2023-08-15 ENCOUNTER — Encounter: Payer: Self-pay | Admitting: Family Medicine

## 2023-08-15 ENCOUNTER — Other Ambulatory Visit: Payer: Self-pay | Admitting: Nurse Practitioner

## 2023-08-15 VITALS — BP 125/74 | HR 86 | Temp 98.3°F | Wt 208.0 lb

## 2023-08-15 DIAGNOSIS — K4090 Unilateral inguinal hernia, without obstruction or gangrene, not specified as recurrent: Secondary | ICD-10-CM | POA: Diagnosis not present

## 2023-08-15 DIAGNOSIS — S41101S Unspecified open wound of right upper arm, sequela: Secondary | ICD-10-CM | POA: Diagnosis not present

## 2023-08-15 DIAGNOSIS — E039 Hypothyroidism, unspecified: Secondary | ICD-10-CM

## 2023-08-15 DIAGNOSIS — I2699 Other pulmonary embolism without acute cor pulmonale: Secondary | ICD-10-CM

## 2023-08-15 MED ORDER — APIXABAN 5 MG PO TABS
5.0000 mg | ORAL_TABLET | Freq: Two times a day (BID) | ORAL | 6 refills | Status: DC
Start: 2023-08-15 — End: 2023-12-05

## 2023-08-15 MED ORDER — HYDROCODONE-ACETAMINOPHEN 5-325 MG PO TABS: 2.0000 | ORAL_TABLET | Freq: Two times a day (BID) | ORAL | 0 refills | Status: DC | PRN

## 2023-08-15 NOTE — Patient Instructions (Addendum)
 MyChart:  For all urgent or time sensitive needs we ask that you please call the office to avoid delays. Our number is [phone]) [phone]. MyChart is not constantly monitored and due to the large volume of messages a day, replies may take up to 72 business hours.   MyChart Policy: MyChart allows for you to see your visit notes, after visit summary, provider recommendations, lab and tests results, make an appointment, request refills, and contact your provider or the office for non-urgent questions or concerns. Providers are seeing patients during normal business hours and do not have built in time to review MyChart messages.  We ask that you allow a minimum of 3 business days for responses to [employer]. For this reason, please do not send urgent requests through MyChart. Please call the office at [phone]. New and ongoing conditions may require a visit. We have virtual and in person visit available for your convenience.  Complex MyChart concerns may require a visit. Your provider may request you schedule a virtual or in person visit to ensure we are providing the best care possible. MyChart messages sent after 11:00 AM on Friday will not be received by the provider until Monday morning.    Lab and Test Results: You will receive your lab and test results on MyChart as soon as they are completed and results have been sent by the lab or testing facility. Due to this service, you will receive your results BEFORE your provider.  I review lab and tests results each morning prior to seeing patients. Some results require collaboration with other providers to ensure you are receiving the most appropriate care. For this reason, we ask that you please allow a minimum of 3-5 business days from the time the ALL results have been received for your provider to receive and review lab and test results and contact you about these.  Most lab and test result comments from the provider will be sent through MyChart.  Your provider may recommend changes to the plan of care, follow-up visits, repeat testing, ask questions, or request an office visit to discuss these results. You may reply directly to this message or call the office at [phone] to provide information for the provider or set up an appointment. In some instances, you will be called with test results and recommendations. Please let us  know if this is preferred and we will make note of this in your chart to provide this for you.    If you have not heard a response to your lab or test results in 5 business days from all results returning to MyChart, please call the office to let us  know. We ask that you please avoid calling prior to this time unless there is an emergent concern. Due to high call volumes, this can delay the resulting process.   After Hours: For all non-emergency after hours needs, please call the office at [phone] and select the option to reach the on-call provider service. On-call services are shared between multiple [facility] offices and therefore it will not be possible to speak directly with your provider. On-call providers may provide medical advice and recommendations, but are unable to provide refills for maintenance medications.  For all emergency or urgent medical needs after normal business hours, we recommend that you seek care at the closest Urgent Care or Emergency Department to ensure appropriate treatment in a timely manner.  [facility] [place] has a 24 hour emergency room located on the ground floor for your  convenience.    Urgent Concerns During the Business Day Providers are seeing patients from 8AM to 5PM, Monday through Thursday, and 8AM to 12PM on Friday with a busy schedule and are most often not able to respond to non-urgent calls until the end of the day or the next business day. If you should have URGENT concerns during the day, please call and speak to the nurse or schedule a same day  appointment so that we can address your concern without delay.    Thank you, again, for choosing me as your health care partner. I appreciate your trust and look forward to learning more about you.    Cory Arts, FNP-C    Left inguinal hernia brace support.

## 2023-08-15 NOTE — Progress Notes (Signed)
 Established Patient Office Visit  Subjective  Patient ID: Cory Hess, male    DOB: Jul 08, 1945  Age: 78 y.o. MRN: [id number]  Chief Complaint  Patient presents with   wound care   [name] is a 78 year old male patient who presents today for wound care follow-up.   WOUND: R antecubital space of the arm & L upper abdomen  Home health/wound care still coming to his home   L INGUINAL HERNIA:  He also has concerns about his left groin pain. He saw general surgery on 6/19 with recommendations Patient reports that Dr. Marinda wants to open him up but he wants a laparoscopic repair  He has a f/u appt on 7/17  HTN: saw Vivienne on 6/19: Coreg  6.25mg  bid & losartan  25mg  daily  Per his note- initially on Lovenox  while hospitalized and was discharged on Eliquis  5mg  twice daily. Vivienne, NP is wondering about re-starting Eliquis  5mg  twice daily-- due to pt experiencing RUL PE s/p thrombectomy while hospitalized    ROS: see HPI     Objective:    BP 125/74   Pulse 86   Temp 98.3 F (36.8 C) (Oral)   Wt 208 lb (94.3 kg)   SpO2 97%   BMI 28.21 kg/m  BP Readings from Last 3 Encounters:  08/17/23 (!) 147/64  08/15/23 125/74  08/12/23 118/75    Physical Exam Vitals reviewed.  Constitutional:      Appearance: Normal appearance.   Cardiovascular:     Rate and Rhythm: Normal rate and regular rhythm.     Pulses: Normal pulses.     Heart sounds: Normal heart sounds.  Pulmonary:     Effort: Pulmonary effort is normal.     Breath sounds: Normal breath sounds.   Neurological:     Mental Status: He is alert.   Psychiatric:        Mood and Affect: Mood normal.        Behavior: Behavior normal.     Assessment & Plan:   Left groin hernia Assessment & Plan: Patient is a pleasant 78 year old male patient who presents today for concerns about his left groin hernia pain. Per general surgery, Dr. Marinda: he thinks an open abdominal repair is required but the patient would prefer a  minimally invasive surgery. Dr. Marinda is going to consult his colleagues to see if either of them would perform a minimally invasive surgical hernia repair. Patient has follow-up appointment scheduled for 09/08/2023. Physical exam today with easily reducible hernia in the left groin. Patient reports that it reduces spontaneously when he lays down. Abdomen is soft, nontender and nondistended. Denies issues with nausea/vomiting, constipation, fever, or overlying skin issues. He does report significant pain. Will increase Norco from 5-325 mg to 10-325 mg Q12 PRN.   Orders: -     HYDROcodone -Acetaminophen ; Take 2 tablets by mouth every 12 (twelve) hours as needed for severe pain (pain score 7-10).  Dispense: 30 tablet; Refill: 0  Acquired hypothyroidism Assessment & Plan: Discussed importance of managing hypothyroidism, in setting of recent cardiac events. Patient currently taking Synthroid  12.5mcg daily before breakfast; however, his TSH is still very elevated. Will treat thyroid  function today and determine if medication adjustment is necessary.   Orders: -     TSH Rfx on Abnormal to Free T4 -     T4F  Open arm wound, right, sequela Assessment & Plan: In his right upper extremity there is also another small wound that he says has been healing for the last  several months. There is some granulation tissue but without any evidence of erythema or purulence from it. Wound care continues to follow him weekly. Continue with current treatment regimen.       Return in about 4 weeks (around 09/12/2023) for chronic conditions .    Evalene Arts, FNP

## 2023-08-16 DIAGNOSIS — I110 Hypertensive heart disease with heart failure: Secondary | ICD-10-CM | POA: Diagnosis not present

## 2023-08-16 DIAGNOSIS — J9602 Acute respiratory failure with hypercapnia: Secondary | ICD-10-CM | POA: Diagnosis not present

## 2023-08-16 DIAGNOSIS — K219 Gastro-esophageal reflux disease without esophagitis: Secondary | ICD-10-CM | POA: Diagnosis not present

## 2023-08-16 DIAGNOSIS — T8189XD Other complications of procedures, not elsewhere classified, subsequent encounter: Secondary | ICD-10-CM | POA: Diagnosis not present

## 2023-08-16 DIAGNOSIS — I5023 Acute on chronic systolic (congestive) heart failure: Secondary | ICD-10-CM | POA: Diagnosis not present

## 2023-08-16 DIAGNOSIS — M1990 Unspecified osteoarthritis, unspecified site: Secondary | ICD-10-CM | POA: Diagnosis not present

## 2023-08-16 LAB — TSH RFX ON ABNORMAL TO FREE T4: TSH: 54.9 u[IU]/mL — ABNORMAL HIGH (ref 0.450–4.500)

## 2023-08-16 LAB — T4F: T4,Free (Direct): 0.74 ng/dL — ABNORMAL LOW (ref 0.82–1.77)

## 2023-08-17 ENCOUNTER — Ambulatory Visit
Admission: EM | Admit: 2023-08-17 | Discharge: 2023-08-17 | Disposition: A | Discharge status: Home / Self Care | Attending: Physician Assistant | Admitting: Physician Assistant

## 2023-08-17 ENCOUNTER — Ambulatory Visit: Payer: Self-pay | Admitting: Family Medicine

## 2023-08-17 DIAGNOSIS — Z8679 Personal history of other diseases of the circulatory system: Secondary | ICD-10-CM | POA: Diagnosis not present

## 2023-08-17 DIAGNOSIS — R42 Dizziness and giddiness: Secondary | ICD-10-CM | POA: Insufficient documentation

## 2023-08-17 DIAGNOSIS — E039 Hypothyroidism, unspecified: Secondary | ICD-10-CM | POA: Insufficient documentation

## 2023-08-17 LAB — CBC WITH DIFFERENTIAL/PLATELET
Abs Immature Granulocytes: 0.02 10*3/uL (ref 0.00–0.07)
Basophils Absolute: 0 10*3/uL (ref 0.0–0.1)
Basophils Relative: 1 %
Eosinophils Absolute: 0.2 10*3/uL (ref 0.0–0.5)
Eosinophils Relative: 4 %
HCT: 36.3 % — ABNORMAL LOW (ref 39.0–52.0)
Hemoglobin: 12 g/dL — ABNORMAL LOW (ref 13.0–17.0)
Immature Granulocytes: 0 %
Lymphocytes Relative: 19 %
Lymphs Abs: 1.1 10*3/uL (ref 0.7–4.0)
MCH: 30.2 pg (ref 26.0–34.0)
MCHC: 33.1 g/dL (ref 30.0–36.0)
MCV: 91.4 fL (ref 80.0–100.0)
Monocytes Absolute: 0.5 10*3/uL (ref 0.1–1.0)
Monocytes Relative: 10 %
Neutro Abs: 3.5 10*3/uL (ref 1.7–7.7)
Neutrophils Relative %: 66 %
Platelets: 122 10*3/uL — ABNORMAL LOW (ref 150–400)
RBC: 3.97 MIL/uL — ABNORMAL LOW (ref 4.22–5.81)
RDW: 13.9 % (ref 11.5–15.5)
WBC: 5.4 10*3/uL (ref 4.0–10.5)
nRBC: 0 % (ref 0.0–0.2)

## 2023-08-17 LAB — COMPREHENSIVE METABOLIC PANEL WITH GFR
ALT: 15 U/L (ref 0–44)
AST: 18 U/L (ref 15–41)
Albumin: 4.3 g/dL (ref 3.5–5.0)
Alkaline Phosphatase: 62 U/L (ref 38–126)
Anion gap: 7 (ref 5–15)
BUN: 19 mg/dL (ref 8–23)
CO2: 25 mmol/L (ref 22–32)
Calcium: 9.2 mg/dL (ref 8.9–10.3)
Chloride: 104 mmol/L (ref 98–111)
Creatinine, Ser: 1.31 mg/dL — ABNORMAL HIGH (ref 0.61–1.24)
GFR, Estimated: 56 mL/min — ABNORMAL LOW (ref >60)
Glucose, Bld: 110 mg/dL — ABNORMAL HIGH (ref 70–99)
Potassium: 4.7 mmol/L (ref 3.5–5.1)
Sodium: 136 mmol/L (ref 135–145)
Total Bilirubin: 0.7 mg/dL (ref 0.0–1.2)
Total Protein: 7.3 g/dL (ref 6.5–8.1)

## 2023-08-17 LAB — GLUCOSE, CAPILLARY: Glucose-Capillary: 99 mg/dL (ref 70–99)

## 2023-08-17 NOTE — Discharge Instructions (Addendum)
-  Start non drowsy over the counter dramamine -Slow and steady position changes and use cane at all times -Lots of rest and fluids - Reach out to your cardiologist about holding off on the Eliquis until your dizziness passes.  I do not want you to fall and hit your head while you are on the blood thinners that does but your risk for bleeding inside your head. - If you develop chest pain, racing heart, shortness of breath, leg swelling, confusion, worsening dizziness, weakness, numbness/tingling, vomiting, abdominal pain, worsening hernia pain, etc. please call 911 or have someone take you immediately to the ER for further evaluation.

## 2023-08-17 NOTE — ED Provider Notes (Signed)
 [facility]    CSN: [id number] Arrival date & time: 08/17/23  1358      History   Chief Complaint Chief Complaint  Patient presents with   Dizziness    HPI Cory Hess is a 78 y.o. 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  Patient presents today for dizziness intermittently for the past 3 days. Worse with suddenly getting up and quickly moving head.  He reports similar symptoms a couple of years ago when he was diagnosed with vertigo.  He says it took about a week to resolve on its own.  He states it was more severe a couple years ago.  Denies headaches, vision changes, nausea/vomiting, confusion, weakness of extremities.  No chest pain, shortness of breath, DOE, palpitations, cough, congestion, hemoptysis, pleuritic pain, weight gain, leg swelling, abdominal pain.   Currently taking Norco prn for hernia.  Has not had any Norco in a few days because he does not feel well when taking it.  States hernia pain is currently about a 2 out of 10.  Has plans to follow-up with general surgery regarding this next month.  He has already seen them on 08/11/23 and they did recommend surgery.  Recommended by cardiology to start Eliquis which was prescribed a couple days ago.  He has not started this medication yet.  Patient had labs done by PCP last week.  TSH was higher than it had been.  Synthroid  dose was increased today but he has not started the higher dose.  HPI  Past Medical History:  Diagnosis Date   Alternating RBBB & LBBB    Ankle swelling 2024   Arthritis    neck   Bladder tumor    CAD (coronary artery disease)    a. 08/2017 STEMI/Cath: LM 80ost/d, LAD 100p, LCX 95 ost/p, RCA 60p, 70m, RPDA 80ost; b. 08/2017 CABG x 3: LIMA->LAD, VG->OM, VG->PDA; c. 03/2023  Cardiac Arrest/Cath: Occluded VG->RPDA, patent LIMA->LAD and VG->pLCX. No new native dzs->Med rx. EF 25-35%.   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 08/2017 TEE: EF 30-35%, sept/ant/inf HK, apical AK. 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 12/2017 Echo: EF 45-50%, diff HK; c. 09/2022 Echo: EF 55-60%; d. 03/2023 Echo (in settng of PE): EF 40-45%, GrI DD (imaging poor); e. 03/2023 Echo w/ definity : EF 55-60%, nl RV fxn.   Hyperlipidemia LDL goal <70    Ischemic cardiomyopathy    a. 08/2017 TEE: EF 30-35%; b. 12/2017 Echo: EF 45-50%; c. 09/2022 Echo: EF 55-60%.   Myocardial infarction (HCC) 08/27/2017   Pulmonary embolism (HCC)    a. 03/2023 Cardiac Arrest/PE req thrombectomy - CTA: Moderate right sided arterial clot burden with occlusive clot in the right upper lobe anterior and posterior segmental arteries and a nonoccluding thrombus in the right lower lobe main artery and superior and posterior basal segment arteries.   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  Essential hypertension    Blood glucose elevated 07/06/2023   Does not have primary care provider 07/01/2023   Thrush 07/01/2023   Former smoker 07/01/2023   Pulmonary embolus (HCC) 04/15/2023   Acute respiratory failure with hypoxia and hypercapnia (HCC) 04/10/2023   Acute CVA (cerebrovascular accident) (HCC) 04/09/2023   Hyperkalemia 04/06/2023   Acute kidney injury (HCC) 04/06/2023   Cardiac arrest (HCC) 04/05/2023   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   Acute on chronic HFrEF (heart failure with reduced ejection fraction) (HCC) 04/05/2023   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   Hematuria 11/06/2018    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   Coronary artery disease 08/28/2017   NSTEMI (non-ST elevated myocardial infarction) [facility])     Past Surgical History:  Procedure Laterality Date   CARDIAC CATHETERIZATION     CATARACT EXTRACTION W/PHACO Left 07/18/2018   Procedure: CATARACT EXTRACTION PHACO AND INTRAOCULAR LENS PLACEMENT (IOC)  LEFT;  Surgeon: Myrna Adine Anes, MD;  Location: [facility];  Service: Ophthalmology;  Laterality: Left;   CATARACT EXTRACTION W/PHACO Right 08/14/2018   Procedure: CATARACT EXTRACTION PHACO AND INTRAOCULAR LENS PLACEMENT (IOC)  RIGHT;  Surgeon: Myrna Adine Anes, MD;  Location: [facility];  Service: Ophthalmology;  Laterality: Right;   CORONARY ARTERY BYPASS GRAFT N/A 08/27/2017   Procedure: CORONARY ARTERY BYPASS GRAFTING (CABG) ON PUMP USING LEFT INTERNAL MAMMARY ARTERY AND LEFT GREATER SAPHENOUS VEIN VIA ENDOVEIN HARVEST;  Surgeon: Lucas Dorise POUR, MD;  Location: [facility];  Service: Open Heart Surgery;  Laterality: N/A;   CORONARY/GRAFT ACUTE MI REVASCULARIZATION N/A 08/27/2017   Procedure: Coronary/Graft Acute MI Revascularization;  Surgeon: Darron Deatrice LABOR, MD;  Location: [facility];  Service: Cardiovascular;  Laterality: N/A;   CYSTOSCOPY W/ RETROGRADES Bilateral 12/25/2018   Procedure: CYSTOSCOPY WITH RETROGRADE PYELOGRAM;  Surgeon: Penne Knee, MD;  Location: [facility] OR[facility];  Service: Urology;  Laterality: Bilateral;   FRACTURE SURGERY Right 1958   arm and left wrist compound fracture, no metal   HERNIA REPAIR Right    inguinial   IR IMAGING GUIDED PORT INSERTION  10/15/2022   LEFT HEART CATH AND CORONARY ANGIOGRAPHY N/A 08/27/2017   Procedure: LEFT HEART CATH AND CORONARY ANGIOGRAPHY;  Surgeon: Darron Deatrice LABOR, MD;  Location: [facility];  Service: Cardiovascular;  Laterality: N/A;   PULMONARY THROMBECTOMY Bilateral 04/18/2023   Procedure: PULMONARY THROMBECTOMY;  Surgeon: Marea Selinda RAMAN, MD;  Location: [facility];  Service:  Cardiovascular;  Laterality: Bilateral;   RIGHT/LEFT HEART CATH AND CORONARY/GRAFT ANGIOGRAPHY N/A 04/05/2023   Procedure: RIGHT/LEFT HEART CATH AND CORONARY/GRAFT ANGIOGRAPHY;  Surgeon: Mady Bruckner, MD;  Location: [facility];  Service: Cardiovascular;  Laterality: N/A;   TEMPORARY PACEMAKER N/A 04/05/2023   Procedure: TEMPORARY PACEMAKER;  Surgeon: Mady Bruckner, MD;  Location: [facility];  Service: Cardiovascular;  Laterality: N/A;   TRANSURETHRAL RESECTION OF BLADDER TUMOR WITH MITOMYCIN -C N/A 12/25/2018   Procedure: TRANSURETHRAL RESECTION OF BLADDER TUMOR WITH Gemcitabine ;  Surgeon: Penne Knee, MD;  Location: [facility];  Service: Urology;  Laterality: N/A;       Home Medications    Prior to Admission medications   Medication Sig Start Date End Date Taking? Authorizing Provider  apixaban (ELIQUIS) 5 MG TABS tablet Take 1 tablet (5 mg total) by mouth 2 (two) times daily. 08/15/23  Yes Vivienne Bruckner Ingle, NP  aspirin  81 MG chewable tablet Place 1 tablet (81 mg total) into feeding tube daily. 04/22/23  Yes Shellia,  Inge BIRCH, NP  atorvastatin  (LIPITOR ) 80 MG tablet Place 1 tablet (80 mg total) into feeding tube daily. 04/22/23  Yes Shellia Inge D, NP  Blood Pressure Monitoring (BLOOD PRESSURE KIT) KIT 1 each by Does not apply route in the morning, at noon, and at bedtime. 07/25/23  Yes Towana Small, FNP  carvedilol  (COREG ) 6.25 MG tablet Take 1 tablet (6.25 mg total) by mouth 2 (two) times daily with a meal. 08/11/23  Yes Vivienne Lonni Ingle, NP  famotidine  (PEPCID ) 20 MG tablet Place 1 tablet (20 mg total) into feeding tube daily. 08/11/23  Yes Vivienne Lonni Ingle, NP  HYDROcodone -acetaminophen  (NORCO/VICODIN) 5-325 MG tablet Take 2 tablets by mouth every 12 (twelve) hours as needed for severe pain (pain score 7-10). 08/15/23  Yes Towana Small, FNP  levothyroxine  (SYNTHROID ) 25 MCG tablet Take 0.5 tablets (12.5 mcg total) by mouth daily  before breakfast. 07/29/23  Yes Towana Small, FNP  losartan  (COZAAR ) 25 MG tablet Take 1 tablet (25 mg total) by mouth daily. 08/11/23 11/09/23 Yes Vivienne Lonni Ingle, NP    Family History Family History  Problem Relation Age of Onset   Heart failure Mother    Lung disease Father    Stroke Father    Cervical cancer Maternal Grandmother     Social History Social History   Tobacco Use   Smoking status: Former    Current packs/day: 0.00    Types: Cigarettes    Quit date: 2000    Years since quitting: 25.4    Passive exposure: Past   Smokeless tobacco: Former    Types: Snuff  Vaping Use   Vaping status: Never Used  Substance Use Topics   Alcohol use: Not Currently   Drug use: Not Currently     Allergies   Patient has no known allergies.   Review of Systems Review of Systems  Constitutional:  Negative for fatigue.  HENT:  Negative for congestion.   Eyes:  Negative for photophobia and visual disturbance.  Respiratory:  Negative for cough and shortness of breath.   Cardiovascular:  Negative for chest pain, palpitations and leg swelling.  Gastrointestinal:  Negative for nausea and vomiting.  Musculoskeletal:  Negative for back pain and gait problem.  Neurological:  Positive for dizziness. Negative for tremors, seizures, syncope, facial asymmetry, speech difficulty, weakness, light-headedness, numbness and headaches.     Physical Exam Triage Vital Signs ED Triage Vitals  Encounter Vitals Group     BP 08/17/23 1407 (!) 147/64     Girls Systolic BP Percentile --      Girls Diastolic BP Percentile --      Boys Systolic BP Percentile --      Boys Diastolic BP Percentile --      Pulse Rate 08/17/23 1407 69     Resp 08/17/23 1401 16     Temp 08/17/23 1407 98.7 F (37.1 C)     Temp Source 08/17/23 1401 Oral     SpO2 08/17/23 1407 95 %     Weight --      Height --      Head Circumference --      Peak Flow --      Pain Score 08/17/23 1400 0     Pain Loc --       Pain Education --      Exclude from Growth Chart --    Orthostatic VS for the past 24 hrs:  BP- Lying Pulse- Lying BP- Sitting Pulse- Sitting BP- Standing at 0  minutes Pulse- Standing at 0 minutes  08/17/23 1419 120/62 76 153/67 82 141/77 86    Updated Vital Signs BP (!) 147/64 (BP Location: Left Arm)   Pulse 69   Temp 98.7 F (37.1 C) (Oral)   Resp 16   SpO2 95%     Physical Exam Vitals and nursing note reviewed.  Constitutional:      General: He is not in acute distress.    Appearance: Normal appearance. He is well-developed. He is not ill-appearing.  HENT:     Head: Normocephalic and atraumatic.     Right Ear: Tympanic membrane, ear canal and external ear normal.     Left Ear: Tympanic membrane, ear canal and external ear normal.     Nose: Nose normal.     Mouth/Throat:     Mouth: Mucous membranes are dry.     Pharynx: Oropharynx is clear.   Eyes:     Conjunctiva/sclera: Conjunctivae normal.    Cardiovascular:     Rate and Rhythm: Normal rate and regular rhythm.     Heart sounds: Normal heart sounds.  Pulmonary:     Effort: Pulmonary effort is normal. No respiratory distress.     Breath sounds: Normal breath sounds.  Abdominal:     Palpations: Abdomen is soft.     Tenderness: There is no abdominal tenderness.   Musculoskeletal:     Cervical back: Neck supple.     Right lower leg: No edema.     Left lower leg: No edema.   Skin:    General: Skin is warm and dry.     Capillary Refill: Capillary refill takes less than 2 seconds.   Neurological:     General: No focal deficit present.     Mental Status: He is alert and oriented to person, place, and time. Mental status is at baseline.     Cranial Nerves: No cranial nerve deficit.     Motor: No weakness.     Coordination: Coordination normal.     Gait: Gait (walks with cane) normal.     Comments: 5 out of 5 strength bilateral upper and lower extremities.  Psychiatric:        Mood and Affect: Mood  normal.        Behavior: Behavior normal.      UC Treatments / Results  Labs (all labs ordered are listed, but only abnormal results are displayed) Labs Reviewed  COMPREHENSIVE METABOLIC PANEL WITH GFR - Abnormal; Notable for the following components:      Result Value   Glucose, Bld 110 (*)    Creatinine, Ser 1.31 (*)    GFR, Estimated 56 (*)    All other components within normal limits  CBC WITH DIFFERENTIAL/PLATELET - Abnormal; Notable for the following components:   RBC 3.97 (*)    Hemoglobin 12.0 (*)    HCT 36.3 (*)    Platelets 122 (*)    All other components within normal limits  GLUCOSE, CAPILLARY  CBG MONITORING, ED        Component Ref Range & Units (hover) 2 d ago 1 mo ago 4 mo ago  TSH 54.900 High  33.900 High  18.053 High  R, CM  Resulting Agency [facility]         Component Ref Range & Units (hover) 2 d ago 1 mo ago  T4,Free (Direct) 0.74 Low  0.82  Resulting Agency [facility]          Radiology  No results found.  Procedures ED EKG  Date/Time: 08/17/2023 2:51 PM  Performed by: Arvis Jolan NOVAK, PA-C Authorized by: Arvis Jolan NOVAK, PA-C   Previous ECG:    Previous ECG:  Compared to current   Similarity:  No change Interpretation:    Interpretation: abnormal   Rate:    ECG rate:  70   ECG rate assessment: normal   Rhythm:    Rhythm: sinus rhythm and A-V block     A-V block: 1st Degree   Ectopy:    Ectopy comment:  Premature supraventricular complexes QRS:    QRS axis:  Normal   QRS intervals:  Normal   QRS conduction: RBBB   Comments:     Sinus rhythm with 1st degree AV block with premature supraventricular complexes, RBBB, T wave inversion to aVL, V2, no ischemia ST elevation, not significant change compared to prior  (including critical care time)  Medications Ordered in UC Medications - No data to display  Initial Impression / Assessment and Plan / UC Course  I have reviewed the triage vital signs and the  nursing notes.  Pertinent labs & imaging results that were available during my care of the patient were reviewed by me and considered in my medical decision making (see chart for details).   78 year old male with history of NSTEMI, CABG, CAD, cardiac arrest in February 2025, heart failure, ischemic cardiomyopathy, previous PE, CVA, hypertension, tongue cancer and hypothyroidism presents for 3-day history of dizziness which is worse with position changes especially from sitting to standing and with lying down and turning his head from side-to-side.  Patient says once he stands up, waits a second and continues to walk the dizziness gets better.  Denies associated chest pain, palpitations, shortness of breath, headaches, vision changes, confusion, weakness, numbness/tingling, balance problems.  Reviewed multiple previous notes from ER, PCP, oncology, general surgery and cardiology.  Blood pressure slightly elevated at 147/64.  Other vitals are all normal and stable and he is overall well-appearing.  No acute distress.  Chest clear.  Heart regular rate and rhythm.  Normal cranial nerves.  Full strength of lower lower extremities.  Able to reproduce dizziness when patient stands quickly and when he lays down and turns his head from side-to-side.  He is not orthostatic.  EKG performed today shows sinus rhythm with first-degree AV block with premature supraventricular complexes, right bundle blanch block, T wave inversion in aVL and V2.  No ST elevation.  No significant changes when compared to previous multiple EKGs in the past couple months.  Finger stick glucose is 99.  Will obtain CBC to further assess his hemoglobin level.  He has had some anemia in the past.  Hemoglobin is 12.0.  Platelets down a little at 122.  GFR 56.  No significant changes with his labs.  Advised patient his symptoms appear to be most consistent with positional vertigo.  Explained it would be safest for him to carefully change  positions, increase fluid intake and try over-the-counter nondrowsy Dramamine.  Advised for him to contact his cardiologist to see if it is okay if you hold off on starting the Eliquis until this bout of dizziness passes.  Explained if he does fall while on a blood thinner and injure his head he has an increased risk of intracranial bleed.  Patient will contact cardiologist regarding this.  I advised patient if he experiences any headaches, vision changes, confusion, lethargy or weakness, chest pain, palpitations, shortness of breath, weakness, increased leg swelling  he should call 911 or immediately go to the ER.  Patient is understanding and agreeable to plan.   Final Clinical Impressions(s) / UC Diagnoses   Final diagnoses:  Dizziness  Hypothyroidism, unspecified type  History of heart disease     Discharge Instructions      -Start non drowsy over the counter dramamine -Slow and steady position changes and use cane at all times -Lots of rest and fluids - Reach out to your cardiologist about holding off on the Eliquis until your dizziness passes.  I do not want you to fall and hit your head while you are on the blood thinners that does but your risk for bleeding inside your head. - If you develop chest pain, racing heart, shortness of breath, leg swelling, confusion, worsening dizziness, weakness, numbness/tingling, vomiting, abdominal pain, worsening hernia pain, etc. please call 911 or have someone take you immediately to the ER for further evaluation.     ED Prescriptions   None    PDMP not reviewed this encounter.   Arvis Jolan NOVAK, PA-C 08/17/23 1911

## 2023-08-17 NOTE — ED Triage Notes (Signed)
 Patient states that he's been getting dizzy x 2-3 days. Got worse when he was coming out of [employer] today about 20 min ago. States that the heat made it worse. States that he feels like he has vertigo. He gets swimmy headed when he turns his head. Hx of vertigo.

## 2023-08-17 NOTE — Telephone Encounter (Signed)
-----   Message from Evalene Arts sent at 08/17/2023  9:21 AM EDT ----- Please call patient and have him take the entire tablet of Synthroid  (25 mcg total) every day before breakfast. Thank you.  ----- Message ----- From: Rebecka Memos Lab Results In Sent: 08/16/2023   5:38 AM EDT To: Evalene Arts, FNP

## 2023-08-17 NOTE — Telephone Encounter (Signed)
 Called patient but had to leave him a detailed message letting him know that Ms. Towana would want for him to take Synthroid  25 mcg tablet daily before breakfast. I also let him know that I would be calling him again to make sure that he received and understood the message.

## 2023-08-19 DIAGNOSIS — S41101S Unspecified open wound of right upper arm, sequela: Secondary | ICD-10-CM | POA: Insufficient documentation

## 2023-08-19 NOTE — Assessment & Plan Note (Signed)
 Discussed importance of managing hypothyroidism, in setting of recent cardiac events. Patient currently taking Synthroid  12.5mcg daily before breakfast; however, his TSH is still very elevated. Will treat thyroid  function today and determine if medication adjustment is necessary.

## 2023-08-19 NOTE — Assessment & Plan Note (Signed)
 In his right upper extremity there is also another small wound that he says has been healing for the last several months. There is some granulation tissue but without any evidence of erythema or purulence from it. Wound care continues to follow him weekly. Continue with current treatment regimen.

## 2023-08-19 NOTE — Assessment & Plan Note (Addendum)
 Patient is a pleasant 78 year old male patient who presents today for concerns about his left groin hernia pain. Per general surgery, Dr. Marinda: he thinks an open abdominal repair is required but the patient would prefer a minimally invasive surgery. Dr. Marinda is going to consult his colleagues to see if either of them would perform a minimally invasive surgical hernia repair. Patient has follow-up appointment scheduled for 09/08/2023. Physical exam today with easily reducible hernia in the left groin. Patient reports that it reduces spontaneously when he lays down. Abdomen is soft, nontender and nondistended. Denies issues with nausea/vomiting, constipation, fever, or overlying skin issues. He does report significant pain. Will increase Norco from 5-325 mg to 10-325 mg Q12 PRN.

## 2023-08-23 ENCOUNTER — Inpatient Hospital Stay: Attending: Internal Medicine | Admitting: Internal Medicine

## 2023-08-23 ENCOUNTER — Telehealth: Payer: Self-pay

## 2023-08-23 ENCOUNTER — Inpatient Hospital Stay

## 2023-08-23 ENCOUNTER — Encounter: Payer: Self-pay | Admitting: Internal Medicine

## 2023-08-23 VITALS — BP 113/57 | HR 65 | Temp 96.8°F | Resp 16 | Ht 72.0 in | Wt 206.8 lb

## 2023-08-23 DIAGNOSIS — Z95828 Presence of other vascular implants and grafts: Secondary | ICD-10-CM

## 2023-08-23 DIAGNOSIS — Z85818 Personal history of malignant neoplasm of other sites of lip, oral cavity, and pharynx: Secondary | ICD-10-CM | POA: Insufficient documentation

## 2023-08-23 DIAGNOSIS — N183 Chronic kidney disease, stage 3 unspecified: Secondary | ICD-10-CM | POA: Insufficient documentation

## 2023-08-23 DIAGNOSIS — E785 Hyperlipidemia, unspecified: Secondary | ICD-10-CM | POA: Diagnosis not present

## 2023-08-23 DIAGNOSIS — C01 Malignant neoplasm of base of tongue: Secondary | ICD-10-CM

## 2023-08-23 DIAGNOSIS — E039 Hypothyroidism, unspecified: Secondary | ICD-10-CM | POA: Diagnosis not present

## 2023-08-23 DIAGNOSIS — I130 Hypertensive heart and chronic kidney disease with heart failure and stage 1 through stage 4 chronic kidney disease, or unspecified chronic kidney disease: Secondary | ICD-10-CM | POA: Diagnosis not present

## 2023-08-23 DIAGNOSIS — Z7982 Long term (current) use of aspirin: Secondary | ICD-10-CM | POA: Diagnosis not present

## 2023-08-23 DIAGNOSIS — Z79899 Other long term (current) drug therapy: Secondary | ICD-10-CM | POA: Insufficient documentation

## 2023-08-23 DIAGNOSIS — I5043 Acute on chronic combined systolic (congestive) and diastolic (congestive) heart failure: Secondary | ICD-10-CM | POA: Diagnosis not present

## 2023-08-23 DIAGNOSIS — Z7901 Long term (current) use of anticoagulants: Secondary | ICD-10-CM | POA: Insufficient documentation

## 2023-08-23 DIAGNOSIS — Z87891 Personal history of nicotine dependence: Secondary | ICD-10-CM | POA: Insufficient documentation

## 2023-08-23 DIAGNOSIS — R590 Localized enlarged lymph nodes: Secondary | ICD-10-CM | POA: Insufficient documentation

## 2023-08-23 LAB — CMP (CANCER CENTER ONLY)
ALT: 18 U/L (ref 0–44)
AST: 22 U/L (ref 15–41)
Albumin: 4.1 g/dL (ref 3.5–5.0)
Alkaline Phosphatase: 56 U/L (ref 38–126)
Anion gap: 6 (ref 5–15)
BUN: 19 mg/dL (ref 8–23)
CO2: 25 mmol/L (ref 22–32)
Calcium: 8.8 mg/dL — ABNORMAL LOW (ref 8.9–10.3)
Chloride: 104 mmol/L (ref 98–111)
Creatinine: 1.33 mg/dL — ABNORMAL HIGH (ref 0.61–1.24)
GFR, Estimated: 55 mL/min — ABNORMAL LOW (ref >60)
Glucose, Bld: 99 mg/dL (ref 70–99)
Potassium: 4.1 mmol/L (ref 3.5–5.1)
Sodium: 135 mmol/L (ref 135–145)
Total Bilirubin: 1 mg/dL (ref 0.0–1.2)
Total Protein: 6.7 g/dL (ref 6.5–8.1)

## 2023-08-23 LAB — PHOSPHORUS: Phosphorus: 3.2 mg/dL (ref 2.5–4.6)

## 2023-08-23 LAB — CBC WITH DIFFERENTIAL (CANCER CENTER ONLY)
Abs Immature Granulocytes: 0.01 10*3/uL (ref 0.00–0.07)
Basophils Absolute: 0 10*3/uL (ref 0.0–0.1)
Basophils Relative: 1 %
Eosinophils Absolute: 0.2 10*3/uL (ref 0.0–0.5)
Eosinophils Relative: 4 %
HCT: 33.7 % — ABNORMAL LOW (ref 39.0–52.0)
Hemoglobin: 11.3 g/dL — ABNORMAL LOW (ref 13.0–17.0)
Immature Granulocytes: 0 %
Lymphocytes Relative: 22 %
Lymphs Abs: 1 10*3/uL (ref 0.7–4.0)
MCH: 30.6 pg (ref 26.0–34.0)
MCHC: 33.5 g/dL (ref 30.0–36.0)
MCV: 91.3 fL (ref 80.0–100.0)
Monocytes Absolute: 0.5 10*3/uL (ref 0.1–1.0)
Monocytes Relative: 11 %
Neutro Abs: 3 10*3/uL (ref 1.7–7.7)
Neutrophils Relative %: 62 %
Platelet Count: 107 10*3/uL — ABNORMAL LOW (ref 150–400)
RBC: 3.69 MIL/uL — ABNORMAL LOW (ref 4.22–5.81)
RDW: 13.7 % (ref 11.5–15.5)
WBC Count: 4.8 10*3/uL (ref 4.0–10.5)
nRBC: 0 % (ref 0.0–0.2)

## 2023-08-23 LAB — MAGNESIUM: Magnesium: 2.2 mg/dL (ref 1.7–2.4)

## 2023-08-23 MED ORDER — SODIUM CHLORIDE 0.9% FLUSH
10.0000 mL | Freq: Once | INTRAVENOUS | Status: AC
  Administered 2023-08-23: 10 mL via INTRAVENOUS
  Filled 2023-08-23: qty 10

## 2023-08-23 MED ORDER — FAMOTIDINE 20 MG PO TABS: 20.0000 mg | ORAL_TABLET | Freq: Every day | ORAL | Status: DC

## 2023-08-23 MED ORDER — HEPARIN SOD (PORK) LOCK FLUSH 100 UNIT/ML IV SOLN
500.0000 [IU] | Freq: Once | INTRAVENOUS | Status: AC
  Administered 2023-08-23: 500 [IU] via INTRAVENOUS
  Filled 2023-08-23: qty 5

## 2023-08-23 MED ORDER — LEVOTHYROXINE SODIUM 100 MCG PO TABS: 100.0000 ug | ORAL_TABLET | Freq: Every day | ORAL | 1 refills | Status: DC

## 2023-08-23 NOTE — Progress Notes (Signed)
 Since last visit pt has been seen in the urgent care and ER on 04/21/23, 07/01/23, 07/17/23, and 08/17/23 for dizziness, hypothyroidism, chest pain, thrush and heart attack.  Concerned with feeding tube that has been taken out is still draining.  Dx with a hernia 2 weeks ago.  Needs refill of pepcid , pended.  No appetite, ensure plus 2-3 per day. He does have some trouble chewing due to teeth. He has a lump in his neck. He wants to know if they got all the cancer out?

## 2023-08-23 NOTE — Telephone Encounter (Signed)
 Copied from CRM [id number]. Topic: General - Other >> Aug 23, 2023 12:35 PM Sophia H wrote: Reason for CRM: Spoke with patient regarding orders for a CT scan that primary provider was going to order, please advise. # [phone]

## 2023-08-23 NOTE — Telephone Encounter (Signed)
 There are CT orders placed from outside providers. Please advise.

## 2023-08-23 NOTE — Assessment & Plan Note (Addendum)
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 JULY 31st, 2024-  Left tongue base primary with ipsilateral level 2 nodal metastasis. A right-sided diminutive level 2 node is mildly hypermetabolic and suspicious for contralateral nodal metastasis.   No extracervical metastatic disease identified.  cisplatin  weekly x6  with concurrent RT [last NOV 9th, 2024]  # CT neck FEB 2025-improvement in the size of the neck adenopathy.  However JUNE 2025- Sonographic evaluation of the neck demonstrates diffuse soft tissue edema of the anterior neck. Plan repeat CT scan- efer to [facility] re: hx of tonsil cancer  # worsening thrombocytopenia- ? Etiology-  Hypothyroidism- vs others-   #  Hypothyroidism- iatrogenic- on synthroid  12.5 mcg [per PCP]- will increase the dose to 100 mcg/day-   # FEB 21st, 2025- ACUTE PE-  Moderate right-sided arterial clot burden on Eliquis - 5 mg BID- provoked- continue for now [6-12 months.    # # Acute respiratory failure [CHF/pneumonia-cardiac arrest-2025]-status post tracheostomy-LTAC  # CKD stage [name]- stable.   # CAD s/p CABG [2019; Dr.End]- AUg 2024-2 D echo- 55-60%.Stable.     # Gait instability- sec to BPPV- walks with cane/ monitor for neuropathy-Stable.   # Hypermetabolic focus within the prostate in the setting of prostatomegaly.-PSA level- 1- WNL [Aug 2024. ]  # IV access: s/p port placement-- functional- port flush- 7/1  # DISPOSITION: # refer to [facility] re: hx of tonsil cancer # CT scan- in 1 week.  # follow up in 2  months- -MD-;port flush- labs- cbc/cmp; thyroid  profile--Dr.B

## 2023-08-23 NOTE — Progress Notes (Signed)
 [facility] CONSULT NOTE  Patient Care Team: Towana Small, FNP as PCP - General (Family Medicine) End, Lonni, MD as PCP - Cardiology (Cardiology)  CHIEF COMPLAINTS/PURPOSE OF CONSULTATION: head and neck cancer  #  Oncology History Overview Note  JULY, 2024- CT scan:  1. 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 2. Metastatic lymphadenopathy at the level 2 station on the left measuring 2.7 x 2.5 x 2.1 cm. 3. Atherosclerotic calcification of the carotid bifurcations.  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   Alone.   Cory Hess 78 y.o.  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  Patient finished chemotherapy radiation in November 2024.  Patient was admitted to hospital in early February- with out-of-hospital cardiac arrest. Downtime for 2 hours- He was transferred to the ICU for ongoing medical care. Found to have Acute Decompensated HFrEF, shock (cardiogenic + septic), Acute Hypoxic & Hypercapnic Respiratory Failure requiring intubation and mechanical ventilation, but was successfully extubated.  Course complicated by development of Pseudomonas Pneumonia and Pulmonary Embolism requiring reintubation and Thrombectomy with Vascular Surgery. With failure to wean from ventilator requiring transfer to  [facility].  Patient is currently at home.  Was recently started on Synthroid  with PCP.  Complains of mild swelling of the neck.  Difficulty swallowing because of tremor.  Otherwise no obstruction.  No nausea no vomiting   Review of Systems  Constitutional:  Positive for malaise/fatigue and weight loss. Negative for chills, diaphoresis and fever.  HENT:  Negative for nosebleeds and sore throat.   Eyes:  Negative for double vision.  Respiratory:  Negative for cough, hemoptysis, sputum production, shortness of breath and wheezing.   Cardiovascular:  Negative for chest pain, palpitations, orthopnea and leg swelling.  Gastrointestinal:  Positive for nausea. Negative for abdominal pain, blood in stool, constipation, diarrhea, heartburn, melena and vomiting.  Genitourinary:  Negative for dysuria, frequency and urgency.  Musculoskeletal:  Positive for back pain and joint pain.  Skin: Negative.  Negative for itching and rash.  Neurological:  Negative for dizziness, tingling, focal weakness, weakness and headaches.  Endo/Heme/Allergies:  Does not bruise/bleed easily.  Psychiatric/Behavioral:  Negative for depression. The patient is not nervous/anxious and does not have insomnia.      MEDICAL HISTORY:  Past Medical History:  Diagnosis Date   Alternating RBBB & LBBB    Ankle swelling 2024   Arthritis    neck   Bladder tumor    CAD (coronary artery disease)    a. 08/2017 STEMI/Cath: LM 80ost/d, LAD 100p, LCX 95 ost/p, RCA 60p, 83m, RPDA 80ost; b. 08/2017 CABG x 3: LIMA->LAD, VG->OM, VG->PDA; c. 03/2023 Cardiac Arrest/Cath: Occluded VG->RPDA, patent LIMA->LAD and VG->pLCX. No new native dzs->Med rx. EF 25-35%.   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 08/2017 TEE: EF 30-35%, sept/ant/inf HK, apical AK. 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 12/2017 Echo: EF 45-50%, diff HK; c. 09/2022 Echo: EF  55-60%; d. 03/2023 Echo (in settng of PE): EF 40-45%, GrI DD (imaging poor); e. 03/2023 Echo w/ definity : EF 55-60%, nl RV fxn.   Hyperlipidemia LDL goal <70    Ischemic cardiomyopathy    a. 08/2017 TEE: EF 30-35%; b. 12/2017 Echo: EF 45-50%; c. 09/2022 Echo: EF 55-60%.   Myocardial infarction (HCC) 08/27/2017   Pulmonary embolism (HCC)    a. 03/2023 Cardiac Arrest/PE req thrombectomy - CTA: Moderate right sided arterial clot burden with occlusive clot in the right upper lobe anterior and posterior segmental arteries and a nonoccluding thrombus in the right lower lobe main artery and superior and posterior basal segment arteries.   Shingles    2018 - right side   Vertigo    several months ago    SURGICAL HISTORY: Past Surgical History:  Procedure Laterality Date   CARDIAC CATHETERIZATION     CATARACT EXTRACTION W/PHACO Left 07/18/2018   Procedure: CATARACT EXTRACTION PHACO AND INTRAOCULAR LENS PLACEMENT (IOC)  LEFT;  Surgeon: Myrna Adine Anes, MD;  Location: [facility];  Service: Ophthalmology;  Laterality: Left;   CATARACT EXTRACTION W/PHACO Right 08/14/2018   Procedure: CATARACT EXTRACTION PHACO AND INTRAOCULAR LENS PLACEMENT (IOC)  RIGHT;  Surgeon: Myrna Adine Anes, MD;  Location: [facility];  Service: Ophthalmology;  Laterality: Right;   CORONARY ARTERY BYPASS GRAFT N/A 08/27/2017   Procedure: CORONARY ARTERY BYPASS GRAFTING (CABG) ON PUMP USING LEFT INTERNAL MAMMARY ARTERY AND LEFT GREATER SAPHENOUS VEIN VIA ENDOVEIN HARVEST;  Surgeon: Lucas Dorise POUR, MD;  Location: [facility] OR;  Service: Open Heart Surgery;  Laterality: N/A;   CORONARY/GRAFT ACUTE MI REVASCULARIZATION N/A 08/27/2017   Procedure: Coronary/Graft Acute MI Revascularization;  Surgeon: Darron Deatrice LABOR, MD;  Location: [facility] INVASIVE CV LAB;  Service: Cardiovascular;  Laterality: N/A;   CYSTOSCOPY W/ RETROGRADES Bilateral 12/25/2018   Procedure: CYSTOSCOPY WITH RETROGRADE PYELOGRAM;  Surgeon: Penne Knee, MD;   Location: [facility] ORS;  Service: Urology;  Laterality: Bilateral;   FRACTURE SURGERY Right 1958   arm and left wrist compound fracture, no metal   HERNIA REPAIR Right    inguinial   IR IMAGING GUIDED PORT INSERTION  10/15/2022   LEFT HEART CATH AND CORONARY ANGIOGRAPHY N/A 08/27/2017   Procedure: LEFT HEART CATH AND CORONARY ANGIOGRAPHY;  Surgeon: Darron Deatrice LABOR, MD;  Location: [facility] INV[facility] CV LAB;  Service: Cardiovascular;  Laterality: N/A;   PULMONARY THROMBECTOMY Bilateral 04/18/2023   Procedure: PULMONARY THROMBECTOMY;  Surgeon: Marea Selinda RAMAN, MD;  Location: [facility] IN[facility];  Service: Cardiovascular;  Laterality: Bilateral;   RIGHT/LEFT HEART CATH AND CORONARY/GRAFT ANGIOGRAPHY N/A 04/05/2023   Procedure: RIGHT/LEFT HEART CATH AND CORONARY/GRAFT ANGIOGRAPHY;  Surgeon: Mady Bruckner, MD;  Location: [facility];  Service: Cardiovascular;  Laterality: N/A;   TEMPORARY PACEMAKER N/A 04/05/2023   Procedure: TEMPORARY PACEMAKER;  Surgeon: Mady Bruckner, MD;  Location: [facility] INVAS[facility] CV LAB;  Service: Cardiovascular;  Laterality: N/A;   TRANSURETHRAL RESECTION OF BLADDER TUMOR WITH MITOMYCIN -C N/A 12/25/2018   Procedure: TRANSURETHRAL RESECTION OF BLADDER TUMOR WITH Gemcitabine ;  Surgeon: Penne Knee, MD;  Location: [facility] ORS;  Service: Urology;  Laterality: N/A;    SOCIAL HISTORY: Social History   Socioeconomic History   Marital status: Single    Spouse name: Not on  file   Number of children: Not on file   Years of education: Not on file   Highest education level: Not on file  Occupational History   Occupation: supervision, Research scientist (life sciences)    Comment: retired  Tobacco Use   Smoking status: Former    Current packs/day: 0.00    Types: Cigarettes    Quit date: 2000    Years since quitting: 25.5    Passive exposure: Past   Smokeless tobacco: Former    Types: Snuff  Vaping Use   Vaping status: Never Used  Substance and Sexual Activity   Alcohol use: Not  Currently   Drug use: Not Currently   Sexual activity: Not on file  Other Topics Concern   Not on file  Social History Narrative   Lives alone   Social Drivers of Health   Financial Resource Strain: Low Risk  (11/06/2018)   Overall Financial Resource Strain (CARDIA)    Difficulty of Paying Living Expenses: Not hard at all  Food Insecurity: Patient Unable To Answer (04/06/2023)   Hunger Vital Sign    Worried About Running Out of Food in the Last Year: Patient unable to answer    Ran Out of Food in the Last Year: Patient unable to answer  Transportation Needs: Patient Unable To Answer (04/06/2023)   PRAPARE - Transportation    Lack of Transportation (Medical): Patient unable to answer    Lack of Transportation (Non-Medical): Patient unable to answer  Physical Activity: Inactive (11/06/2018)   Exercise Vital Sign    Days of Exercise per Week: 0 days    Minutes of Exercise per Session: 0 min  Stress: No Stress Concern Present (11/06/2018)   [employer] of Occupational Health - Occupational Stress Questionnaire    Feeling of Stress : Not at all  Social Connections: Patient Unable To Answer (04/06/2023)   Social Connection and Isolation Panel    Frequency of Communication with Friends and Family: Patient unable to answer    Frequency of Social Gatherings with Friends and Family: Patient unable to answer    Attends Religious Services: Patient unable to answer    Active Member of Clubs or Organizations: Patient unable to answer    Attends Banker Meetings: Patient unable to answer    Marital Status: Patient unable to answer  Intimate Partner Violence: Unknown (04/06/2023)   Humiliation, Afraid, Rape, and Kick questionnaire    Fear of Current or Ex-Partner: Patient unable to answer    Emotionally Abused: Patient unable to answer    Physically Abused: No    Sexually Abused: Patient unable to answer    FAMILY HISTORY: Family History  Problem Relation Age of Onset    Heart failure Mother    Lung disease Father    Stroke Father    Cervical cancer Maternal Grandmother     ALLERGIES:  has no known allergies.  MEDICATIONS:  Current Outpatient Medications  Medication Sig Dispense Refill   apixaban  (ELIQUIS ) 5 MG TABS tablet Take 1 tablet (5 mg total) by mouth 2 (two) times daily. 60 tablet 6   aspirin  81 MG chewable tablet Place 1 tablet (81 mg total) into feeding tube daily.     atorvastatin  (LIPITOR ) 80 MG tablet Place 1 tablet (80 mg total) into feeding tube daily.     Blood Pressure Monitoring (BLOOD PRESSURE KIT) KIT 1 each by Does not apply route in the morning, at noon, and at bedtime. 1 kit 0   carvedilol  (COREG ) 6.25  MG tablet Take 1 tablet (6.25 mg total) by mouth 2 (two) times daily with a meal. 90 tablet 3   HYDROcodone -acetaminophen  (NORCO/VICODIN) 5-325 MG tablet Take 2 tablets by mouth every 12 (twelve) hours as needed for severe pain (pain score 7-10). 30 tablet 0   losartan  (COZAAR ) 25 MG tablet Take 1 tablet (25 mg total) by mouth daily. 90 tablet 3   famotidine  (PEPCID ) 20 MG tablet Place 1 tablet (20 mg total) into feeding tube daily.     levothyroxine  (SYNTHROID ) 100 MCG tablet Take 1 tablet (100 mcg total) by mouth daily before breakfast. 60 tablet 1   No current facility-administered medications for this visit.     PHYSICAL EXAMINATION:   Vitals:   08/23/23 1240  BP: (!) 113/57  Pulse: 65  Resp: 16  Temp: (!) 96.8 F (36 C)  SpO2: 100%    Filed Weights   08/23/23 1240  Weight: 206 lb 12.8 oz (93.8 kg)   Prior Left neck 2 cm submandibular LN - resolved.;  Generalized edema noted in the neck.  PEG tube explanted-no signs of infection.  Physical Exam Vitals and nursing note reviewed.  HENT:     Head: Normocephalic and atraumatic.     Mouth/Throat:     Pharynx: Oropharynx is clear.   Eyes:     Extraocular Movements: Extraocular movements intact.     Pupils: Pupils are equal, round, and reactive to light.     Cardiovascular:     Rate and Rhythm: Normal rate and regular rhythm.  Pulmonary:     Comments: Decreased breath sounds bilaterally.  Abdominal:     Palpations: Abdomen is soft.   Musculoskeletal:        General: Normal range of motion.     Cervical back: Normal range of motion.   Skin:    General: Skin is warm.   Neurological:     General: No focal deficit present.     Mental Status: He is alert and oriented to person, place, and time.   Psychiatric:        Behavior: Behavior normal.        Judgment: Judgment normal.      LABORATORY DATA:  I have reviewed the data as listed Lab Results  Component Value Date   WBC 4.8 08/23/2023   HGB 11.3 (L) 08/23/2023   HCT 33.7 (L) 08/23/2023   MCV 91.3 08/23/2023   PLT 107 (L) 08/23/2023   Recent Labs    04/07/23 0758 04/07/23 1802 04/09/23 1453 04/10/23 0418 07/17/23 1647 07/17/23 1648 08/17/23 1436 08/23/23 1242  NA  --    < >  --    < > 137  --  136 135  K  --    < > 3.6   < > 4.1  --  4.7 4.1  CL  --    < >  --    < > 105  --  104 104  CO2  --    < >  --    < > 25  --  25 25  GLUCOSE  --    < >  --    < > 98  --  110* 99  BUN  --    < >  --    < > 13  --  19 19  CREATININE  --    < >  --    < > 1.18  --  1.31* 1.33*  CALCIUM   --    < >  --    < >  8.8*  --  9.2 8.8*  GFRNONAA  --    < >  --    < > >60  --  56* 55*  PROT 5.5*  --  5.3*   < >  --  6.3* 7.3 6.7  ALBUMIN  2.6*  --  2.4*   < >  --  3.8 4.3 4.1  AST 83*  --  37   < >  --  19 18 22   ALT 146*  --  64*   < >  --  12 15 18   ALKPHOS 80  --  73   < >  --  56 62 56  BILITOT 0.7  --  0.9   < >  --  0.8 0.7 1.0  BILIDIR 0.2  --  0.2  --   --  0.1  --   --   IBILI 0.5  --  0.7  --   --  0.7  --   --    < > = values in this interval not displayed.    RADIOGRAPHIC STUDIES: I have personally reviewed the radiological images as listed and agreed with the findings in the report. MR PELVIS WO CONTRAST Result Date: 08/04/2023 CLINICAL DATA:  Hernia suspected,  inguinal or femoral. EXAM: MRI PELVIS WITHOUT CONTRAST TECHNIQUE: Multiplanar multisequence MR imaging of the pelvis was performed. No intravenous contrast was administered. COMPARISON:  CT scan abdomen and pelvis from 07/17/2023. FINDINGS: Urinary Tract: Urinary bladder is partially distended. There are trabeculations, suggesting sequela of chronic urinary outflow obstruction. No focal mass or filling defect. Bowel:  Unremarkable visualized pelvic bowel loops. Vascular/Lymphatic: No pathologically enlarged lymph nodes. No significant vascular abnormality seen. Reproductive: There is prostatomegaly. Symmetric bilateral seminal vesicles. Other: There is small-to-moderate left fat containing inguinal hernia and small fat containing right inguinal hernia. No herniation of bowel loops. No femoral hernia seen on either side. Musculoskeletal: No suspicious bone lesions identified. IMPRESSION: *Small-to-moderate left and small right fat containing inguinal hernias. No herniation of bowel loops. No femoral hernia seen on either side. *Prostatomegaly.  Correlate with serum PSA levels. Electronically Signed   By: Ree Molt M.D.   On: 08/04/2023 11:33   US  Soft Tissue Head/Neck (NON-THYROID ) Result Date: 08/04/2023 CLINICAL DATA:  Palpable mass of laryngeal prominence EXAM: ULTRASOUND OF HEAD/NECK SOFT TISSUES TECHNIQUE: Ultrasound examination of the head and neck soft tissues was performed in the area of clinical concern. COMPARISON:  CT neck 04/11/2023 FINDINGS: Sonographic evaluation of the neck demonstrates diffuse soft tissue edema of the anterior neck. IMPRESSION: Diffuse soft tissue edema of the anterior neck of uncertain etiology. No organized abscess identified. Electronically Signed   By: Aliene Lloyd M.D.   On: 08/04/2023 07:58    ASSESSMENT & PLAN:   Cancer of base of tongue (HCC) # JULY-AUG 2024-Clinically SCC of left base of tongue-with left neck lymphadenopathy.  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 JULY 31st, 2024-  Left tongue base primary with ipsilateral level 2 nodal metastasis. A right-sided diminutive level 2 node is mildly hypermetabolic and suspicious for contralateral nodal metastasis.   No extracervical metastatic disease identified.  cisplatin  weekly x6  with concurrent RT [last NOV 9th, 2024]  # CT neck FEB 2025-improvement in the size of the neck adenopathy.  However JUNE 2025- Sonographic evaluation of the neck demonstrates diffuse soft tissue edema of the anterior neck. Plan repeat CT scan-  efer to [facility] re: hx of tonsil cancer  # worsening thrombocytopenia- ? Etiology-  Hypothyroidism- vs others-   #  Hypothyroidism- iatrogenic- on synthroid  12.5 mcg [per PCP]- will increase the dose to 100 mcg/day-   # FEB 21st, 2025- ACUTE PE-  Moderate right-sided arterial clot burden on Eliquis - 5 mg BID- provoked- continue for now [6-12 months.    # # Acute respiratory failure [CHF/pneumonia-cardiac arrest-2025]-status post tracheostomy-LTAC  # CKD stage [name]- stable.   # CAD s/p CABG [2019; Dr.End]- AUg 2024-2 D echo- 55-60%.Stable.     # Gait instability- sec to BPPV- walks with cane/ monitor for neuropathy-Stable.   # Hypermetabolic focus within the prostate in the setting of prostatomegaly.-PSA level- 1- WNL [Aug 2024. ]  # IV access: s/p port placement-- functional- port flush- 7/1  # DISPOSITION: # refer to [facility] re: hx of tonsil cancer # CT scan- in 1 week.  # follow up in 2  months- -MD-;port flush- labs- cbc/cmp; thyroid  profile--Dr.B    All questions were answered. The patient knows to call the clinic with any problems, questions or concerns.    Cindy JONELLE Joe, MD 08/23/2023 2:42 PM

## 2023-08-24 ENCOUNTER — Other Ambulatory Visit: Payer: Self-pay

## 2023-08-24 NOTE — Telephone Encounter (Signed)
 LVM requesting a return call.

## 2023-08-25 DIAGNOSIS — T8189XD Other complications of procedures, not elsewhere classified, subsequent encounter: Secondary | ICD-10-CM | POA: Diagnosis not present

## 2023-08-25 DIAGNOSIS — I5023 Acute on chronic systolic (congestive) heart failure: Secondary | ICD-10-CM | POA: Diagnosis not present

## 2023-08-25 DIAGNOSIS — M1990 Unspecified osteoarthritis, unspecified site: Secondary | ICD-10-CM | POA: Diagnosis not present

## 2023-08-25 DIAGNOSIS — K219 Gastro-esophageal reflux disease without esophagitis: Secondary | ICD-10-CM | POA: Diagnosis not present

## 2023-08-25 DIAGNOSIS — J9602 Acute respiratory failure with hypercapnia: Secondary | ICD-10-CM | POA: Diagnosis not present

## 2023-08-25 DIAGNOSIS — I110 Hypertensive heart disease with heart failure: Secondary | ICD-10-CM | POA: Diagnosis not present

## 2023-08-29 ENCOUNTER — Telehealth: Payer: Self-pay | Admitting: Family Medicine

## 2023-08-29 NOTE — Telephone Encounter (Signed)
 Copied from CRM [id number]. Topic: General - Call Back - No Documentation >> Aug 25, 2023  2:21 PM Geneva B wrote: Reason for CRM: home health  calling they will be sending over a pic of wound please call [phone] pt will be discharged from home health  as of today 08/25/2023

## 2023-08-30 ENCOUNTER — Telehealth: Payer: Self-pay

## 2023-08-30 ENCOUNTER — Telehealth: Payer: Self-pay | Admitting: Internal Medicine

## 2023-08-30 NOTE — Telephone Encounter (Signed)
 Called patient to verify he needs brace for elbow and back but had to leave a message. Rcvd orders to sign but can nt sign until verified with patient.

## 2023-08-30 NOTE — Telephone Encounter (Signed)
 Called patient left a voicemail to expect a call from [facility] to schedule CT scan.

## 2023-09-01 ENCOUNTER — Telehealth: Payer: Self-pay

## 2023-09-01 NOTE — Telephone Encounter (Signed)
 Spoke with Narlan at [facility], pt has been sch'd for an appt with Dr. Herminio 09/07/23 at 2:50.

## 2023-09-02 ENCOUNTER — Encounter: Payer: Self-pay | Admitting: Internal Medicine

## 2023-09-02 ENCOUNTER — Ambulatory Visit
Admission: RE | Admit: 2023-09-02 | Discharge: 2023-09-02 | Disposition: A | Discharge status: Home / Self Care | Source: Ambulatory Visit | Attending: Internal Medicine

## 2023-09-02 DIAGNOSIS — Z08 Encounter for follow-up examination after completed treatment for malignant neoplasm: Secondary | ICD-10-CM | POA: Diagnosis not present

## 2023-09-02 DIAGNOSIS — I6523 Occlusion and stenosis of bilateral carotid arteries: Secondary | ICD-10-CM | POA: Diagnosis not present

## 2023-09-02 DIAGNOSIS — Z85818 Personal history of malignant neoplasm of other sites of lip, oral cavity, and pharynx: Secondary | ICD-10-CM | POA: Diagnosis not present

## 2023-09-02 DIAGNOSIS — C01 Malignant neoplasm of base of tongue: Secondary | ICD-10-CM

## 2023-09-02 DIAGNOSIS — E039 Hypothyroidism, unspecified: Secondary | ICD-10-CM

## 2023-09-02 MED ORDER — HEPARIN SOD (PORK) LOCK FLUSH 100 UNIT/ML IV SOLN
500.0000 [IU] | Freq: Once | INTRAVENOUS | Status: AC
  Administered 2023-09-02: 500 [IU] via INTRAVENOUS

## 2023-09-02 MED ORDER — IOPAMIDOL (ISOVUE-300) INJECTION 61%
75.0000 mL | Freq: Once | INTRAVENOUS | Status: AC | PRN
  Administered 2023-09-02: 75 mL via INTRAVENOUS

## 2023-09-02 MED ORDER — SODIUM CHLORIDE 0.9% FLUSH
10.0000 mL | INTRAVENOUS | Status: DC | PRN
Start: 2023-09-02 — End: 2023-09-03
  Administered 2023-09-02: 10 mL via INTRAVENOUS

## 2023-09-05 ENCOUNTER — Other Ambulatory Visit: Payer: Self-pay | Admitting: *Deleted

## 2023-09-05 ENCOUNTER — Ambulatory Visit: Payer: Self-pay | Admitting: Internal Medicine

## 2023-09-05 ENCOUNTER — Telehealth: Payer: Self-pay | Admitting: *Deleted

## 2023-09-05 DIAGNOSIS — I25810 Atherosclerosis of coronary artery bypass graft(s) without angina pectoris: Secondary | ICD-10-CM

## 2023-09-05 DIAGNOSIS — C01 Malignant neoplasm of base of tongue: Secondary | ICD-10-CM

## 2023-09-05 DIAGNOSIS — I2510 Atherosclerotic heart disease of native coronary artery without angina pectoris: Secondary | ICD-10-CM

## 2023-09-05 NOTE — Telephone Encounter (Signed)
 Spoke to patient regarding results of the CT scan-improved/resolved tonsil cancer.  Again await evaluation with Dr. Mcqueen later in the week.  Recommend evaluation with vascular given the-right carotid artery occlusion noted.   Please make a referral to vascular-re right carotid artery occlusion   Patient to follow-up with me as-recommended-  GB

## 2023-09-05 NOTE — Telephone Encounter (Signed)
 Please make a referral to vascular-re right carotid artery occlusion . Referral placed in epic and faxed to [facility].

## 2023-09-07 DIAGNOSIS — R221 Localized swelling, mass and lump, neck: Secondary | ICD-10-CM | POA: Diagnosis not present

## 2023-09-07 DIAGNOSIS — I6529 Occlusion and stenosis of unspecified carotid artery: Secondary | ICD-10-CM | POA: Diagnosis not present

## 2023-09-07 DIAGNOSIS — R1310 Dysphagia, unspecified: Secondary | ICD-10-CM | POA: Diagnosis not present

## 2023-09-07 DIAGNOSIS — Z85810 Personal history of malignant neoplasm of tongue: Secondary | ICD-10-CM | POA: Diagnosis not present

## 2023-09-08 ENCOUNTER — Telehealth: Payer: Self-pay | Admitting: *Deleted

## 2023-09-08 ENCOUNTER — Encounter: Payer: Self-pay | Admitting: General Surgery

## 2023-09-08 ENCOUNTER — Telehealth: Payer: Self-pay | Admitting: Nurse Practitioner

## 2023-09-08 ENCOUNTER — Ambulatory Visit: Admitting: General Surgery

## 2023-09-08 VITALS — BP 115/74 | HR 66 | Temp 98.0°F | Ht 72.0 in | Wt 203.2 lb

## 2023-09-08 DIAGNOSIS — K4090 Unilateral inguinal hernia, without obstruction or gangrene, not specified as recurrent: Secondary | ICD-10-CM

## 2023-09-08 NOTE — Telephone Encounter (Signed)
 Faxed Cardiac Clearance to Dr Lonni Meager at [phone]

## 2023-09-08 NOTE — Telephone Encounter (Signed)
   Pre-operative Risk Assessment    Patient Name: Cory Hess  DOB: 07-Sep-1945 MRN: [id number]   Date of last office visit: unknown Date of next office visit: unknown   Request for Surgical Clearance    Procedure:  left inguinal hernia repair  Date of Surgery:  Clearance TBD                                Surgeon:  Dr. Marinda Socks Group or Practice Name:  [facility] Phone number:  [phone] Fax number:  [phone]   Type of Clearance Requested:   - Medical    Type of Anesthesia:  General    Additional requests/questions:    SignedBerwyn LELON Sprung   09/08/2023, 2:13 PM

## 2023-09-08 NOTE — Patient Instructions (Signed)
 We will see you back in 6 weeks for a follow up with Dr Marinda  A Cardiac Clearance will be sent to your Cardiology   Please call the office if you have any questions or concerns

## 2023-09-13 ENCOUNTER — Encounter: Payer: Self-pay | Admitting: Family Medicine

## 2023-09-13 ENCOUNTER — Telehealth: Payer: Self-pay | Admitting: Cardiovascular Disease

## 2023-09-13 ENCOUNTER — Ambulatory Visit (INDEPENDENT_AMBULATORY_CARE_PROVIDER_SITE_OTHER): Admitting: Family Medicine

## 2023-09-13 ENCOUNTER — Other Ambulatory Visit: Payer: Self-pay

## 2023-09-13 VITALS — BP 127/77 | HR 99 | Resp 16 | Ht 72.0 in | Wt 205.0 lb

## 2023-09-13 DIAGNOSIS — Z87828 Personal history of other (healed) physical injury and trauma: Secondary | ICD-10-CM | POA: Diagnosis not present

## 2023-09-13 DIAGNOSIS — E039 Hypothyroidism, unspecified: Secondary | ICD-10-CM | POA: Diagnosis not present

## 2023-09-13 DIAGNOSIS — K4090 Unilateral inguinal hernia, without obstruction or gangrene, not specified as recurrent: Secondary | ICD-10-CM

## 2023-09-13 DIAGNOSIS — S41101S Unspecified open wound of right upper arm, sequela: Secondary | ICD-10-CM

## 2023-09-13 MED ORDER — ATORVASTATIN CALCIUM 80 MG PO TABS: 80.0000 mg | ORAL_TABLET | Freq: Every day | ORAL | 3 refills | Status: DC

## 2023-09-13 NOTE — Patient Instructions (Signed)
 MyChart:  For all urgent or time sensitive needs we ask that you please call the office to avoid delays. Our number is [phone]) [id number]. MyChart is not constantly monitored and due to the large volume of messages a day, replies may take up to 72 business hours.   MyChart Policy: MyChart allows for you to see your visit notes, after visit summary, provider recommendations, lab and tests results, make an appointment, request refills, and contact your provider or the office for non-urgent questions or concerns. Providers are seeing patients during normal business hours and do not have built in time to review MyChart messages.  We ask that you allow a minimum of 3 business days for responses to [employer]. For this reason, please do not send urgent requests through MyChart. Please call the office at [phone]. New and ongoing conditions may require a visit. We have virtual and in person visit available for your convenience.  Complex MyChart concerns may require a visit. Your provider may request you schedule a virtual or in person visit to ensure we are providing the best care possible. MyChart messages sent after 11:00 AM on Friday will not be received by the provider until Monday morning.    Lab and Test Results: You will receive your lab and test results on MyChart as soon as they are completed and results have been sent by the lab or testing facility. Due to this service, you will receive your results BEFORE your provider.  I review lab and tests results each morning prior to seeing patients. Some results require collaboration with other providers to ensure you are receiving the most appropriate care. For this reason, we ask that you please allow a minimum of 3-5 business days from the time the ALL results have been received for your provider to receive and review lab and test results and contact you about these.  Most lab and test result comments from the provider will be sent through MyChart.  Your provider may recommend changes to the plan of care, follow-up visits, repeat testing, ask questions, or request an office visit to discuss these results. You may reply directly to this message or call the office at [phone] to provide information for the provider or set up an appointment. In some instances, you will be called with test results and recommendations. Please let us know if this is preferred and we will make note of this in your chart to provide this for you.    If you have not heard a response to your lab or test results in 5 business days from all results returning to MyChart, please call the office to let us know. We ask that you please avoid calling prior to this time unless there is an emergent concern. Due to high call volumes, this can delay the resulting process.   After Hours: For all non-emergency after hours needs, please call the office at [phone] and select the option to reach the on-call provider service. On-call services are shared between multiple [facility] offices and therefore it will not be possible to speak directly with your provider. On-call providers may provide medical advice and recommendations, but are unable to provide refills for maintenance medications.  For all emergency or urgent medical needs after normal business hours, we recommend that you seek care at the closest Urgent Care or Emergency Department to ensure appropriate treatment in a timely manner.  [facility] has a 24 hour emergency room located on the ground floor for your  convenience.    Urgent Concerns During the Business Day Providers are seeing patients from 8AM to 5PM, Monday through Thursday, and 8AM to 12PM on Friday with a busy schedule and are most often not able to respond to non-urgent calls until the end of the day or the next business day. If you should have URGENT concerns during the day, please call and speak to the nurse or schedule a same day  appointment so that we can address your concern without delay.    Thank you, again, for choosing me as your health care partner. I appreciate your trust and look forward to learning more about you.    Alyson Reedy, FNP-C

## 2023-09-13 NOTE — Telephone Encounter (Signed)
*  STAT* If patient is at the pharmacy, call can be transferred to refill team.   1. Which medications need to be refilled? (please list name of each medication and dose if known) atorvastatin  (LIPITOR ) 80 MG tablet  2. Which pharmacy/location (including street and city if local pharmacy) is medication to be sent to? [facility] [address] [place] - [address] Phone: [phone]  Fax: [phone]     3. Do they need a 30 day or 90 day supply? 90

## 2023-09-13 NOTE — Progress Notes (Signed)
   Established Patient Office Visit  Subjective  Patient ID: Cory Hess, male    DOB: [birth year]  Age: 78 y.o. MRN: [id number]  Chief Complaint  Patient presents with   Follow-up   WOUNDS: R ARM & LUQ ABD    R arm is completely closed and healed. He took the bandage off 2 days ago.  L upper abd- scabbed over, not draining   ROS: see HPI     Objective:     BP 127/77   Pulse 99   Resp 16   Ht 6' (1.829 m)   Wt 205 lb (93 kg)   SpO2 93%   BMI 27.80 kg/m  BP Readings from Last 3 Encounters:  09/13/23 127/77  09/08/23 115/74  08/23/23 (!) 113/57     Physical Exam Skin:    Findings: Wound present.         Comments: R arm- closed wound       Assessment & Plan:   1. Healed wound (Primary) Patient is a pleasant 78 year old male patient who presents today for wound care follow-up. The open wound on his R arm is completely healed with complete closure. Abdominal wound I sstill healing from previous PEG tube but is not draining serosanguinous fluid and does have a scab present over it. Advised patient to continue to use triple antibiotic ointment and keep open to air to facilitate healing.   2. Acquired hypothyroidism Iatrogenic. Patient was started on 12.5mcg daily and increased to 25mcg daily. After seeing [facility], Dr. Rennie increased patient to 100mcg Synthroid  daily on 08/23/2023. Repeat TSH was ordered by this same provider. Patient should have this completed within 6-8 weeks of new dose.   3. Left groin hernia Followed by general surgery and is most likely having this repaired after receiving cardiac clearance. He reports wearing a hernia support with pain relief.    Return in about 3 months (around 12/14/2023) for chronic conditions .    Evalene Arts, FNP

## 2023-09-13 NOTE — Telephone Encounter (Signed)
 Patient with diagnosis of PE on Eliquis  for anticoagulation.    Procedure: left inguinal hernia repair  Date of procedure: TBD  On Apr 15 2023 patient has an occlusive clot in the right upper lobe anterior and posterior segmental arteries with nonoccluding thrombus in the right lower lobe main artery and superior posterior basal segmental arteries. He required thrombectomy.   CrCl 55 ml/min Platelet count 107  I will defer to oncology as to when it would be safe to interrupt anticoagulation for surgery. Would ideally wait for 6 months post event, although it sounds as though anticoagulation may have been inadvertently interrupted in transition for rehab to home.   **This guidance is not considered finalized until pre-operative APP has relayed final recommendations.**

## 2023-09-14 ENCOUNTER — Other Ambulatory Visit: Payer: Self-pay

## 2023-09-14 NOTE — Telephone Encounter (Signed)
 Pt has appt 09/23/23 with Medford Meager, NP. I will update appt notes to reflect need preop clearance.

## 2023-09-14 NOTE — Telephone Encounter (Signed)
 UPDATE ON CLEARANCE REQUEST:  Surgeon will need recommendations for Eliquis  and ASA to be held.

## 2023-09-14 NOTE — Telephone Encounter (Signed)
   Name: Cory Hess  DOB: [birth year]  MRN: [id number]  Primary Cardiologist: Lonni Hanson, MD  Chart reviewed as part of pre-operative protocol coverage. The patient has an upcoming visit scheduled with Medford Meager, NP on 09/23/2023 at which time clearance can be addressed in case there are any issues that would impact surgical recommendations.  Left inguinal hernia repair Is not scheduled until TBD as below. I added preop FYI to appointment note so that provider is aware to address at time of outpatient visit.  Per office protocol the cardiology provider should forward their finalized clearance decision and recommendations regarding antiplatelet therapy to the requesting party below.    Per Pharmacy 09/13/2023 On Apr 15 2023 patient has an occlusive clot in the right upper lobe anterior and posterior segmental arteries with nonoccluding thrombus in the right lower lobe main artery and superior posterior basal segmental arteries. He required thrombectomy. l defer to oncology as to when it would be safe to interrupt anticoagulation for surgery. Would ideally wait for 6 months post event, although it sounds as though anticoagulation may have been inadvertently interrupted in transition for rehab to home.   I will route this message as FYI to requesting party and remove this message from the preop box as separate preop APP input not needed at this time.   Please call with any questions.  Lamarr Satterfield, NP  09/14/2023, 7:46 AM

## 2023-09-16 ENCOUNTER — Telehealth: Payer: Self-pay

## 2023-09-16 NOTE — Telephone Encounter (Signed)
 Referral sent 09/05/23 to [facility] for conuslt. right carotid artery occlusion. CT neck in epic.SABRA  Patient scheduled 7/29 at 11:45 w/ Dr. Marea.

## 2023-09-16 NOTE — Progress Notes (Signed)
 Outpatient Surgical Follow Up  This baby's baby's status  Cory Hess is an 78 y.o. male.   Chief Complaint  Patient presents with   Follow-up    hernia    HPI: Mr. Hudler returns today to discuss his left inguinal hernia.  He reports he still has intermittent pain from this hernia.  He still feels a bulge.  He actually went to the emergency department soon after our last visit and was discharged because the hernia was reducible.  In the interim he was placed back on his Eliquis  because he did have a PE back in February with a code event.  He reports that he has been back on his Eliquis  for 2 to 3 weeks.  He denies chest pain and says that he requires a cane for walking.  He denies shortness of breath and says sometimes he does get swelling in his lower extremities.  Past Medical History:  Diagnosis Date   Alternating RBBB & LBBB    Ankle swelling 2024   Arthritis    neck   Bladder tumor    CAD (coronary artery disease)    a. 08/2017 STEMI/Cath: LM 80ost/d, LAD 100p, LCX 95 ost/p, RCA 60p, 46m, RPDA 80ost; b. 08/2017 CABG x 3: LIMA->LAD, VG->OM, VG->PDA; c. 03/2023 Cardiac Arrest/Cath: Occluded VG->RPDA, patent LIMA->LAD and VG->pLCX. No new native dzs->Med rx. EF 25-35%.   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 08/2017 TEE: EF 30-35%, sept/ant/inf HK, apical AK. 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 12/2017 Echo: EF 45-50%, diff HK; c. 09/2022 Echo: EF 55-60%; d. 03/2023 Echo (in settng of PE): EF 40-45%, GrI DD (imaging poor); e. 03/2023 Echo w/ definity : EF 55-60%, nl RV fxn.   Hyperlipidemia LDL goal <70    Ischemic cardiomyopathy    a. 08/2017 TEE: EF 30-35%; b. 12/2017 Echo: EF 45-50%; c. 09/2022 Echo: EF 55-60%.   Myocardial infarction (HCC) 08/27/2017   Pulmonary embolism (HCC)    a. 03/2023 Cardiac Arrest/PE req thrombectomy - CTA: Moderate right sided arterial clot  burden with occlusive clot in the right upper lobe anterior and posterior segmental arteries and a nonoccluding thrombus in the right lower lobe main artery and superior and posterior basal segment arteries.   Shingles    2018 - right side   Vertigo    several months ago    Past Surgical History:  Procedure Laterality Date   CARDIAC CATHETERIZATION     CATARACT EXTRACTION W/PHACO Left 07/18/2018   Procedure: CATARACT EXTRACTION PHACO AND INTRAOCULAR LENS PLACEMENT (IOC)  LEFT;  Surgeon: Myrna Adine Anes, MD;  Location: [facility];  Service: Ophthalmology;  Laterality: Left;   CATARACT EXTRACTION W/PHACO Right 08/14/2018   Procedure: CATARACT EXTRACTION PHACO AND INTRAOCULAR LENS PLACEMENT (IOC)  RIGHT;  Surgeon: Myrna Adine Anes, MD;  Location: [facility];  Service: Ophthalmology;  Laterality: Right;   CORONARY ARTERY BYPASS GRAFT N/A 08/27/2017   Procedure: CORONARY ARTERY BYPASS GRAFTING (CABG) ON PUMP USING LEFT INTERNAL MAMMARY ARTERY AND LEFT GREATER SAPHENOUS VEIN VIA ENDOVEIN HARVEST;  Surgeon: Lucas Dorise POUR, MD;  Location: [facility] OR;  Service: Open Heart Surgery;  Laterality: N/A;   CORONARY/GRAFT ACUTE MI REVASCULARIZATION N/A 08/27/2017   Procedure: Coronary/Graft Acute MI Revascularization;  Surgeon: Darron Deatrice LABOR, MD;  Location: [facility];  Service: Cardiovascular;  Laterality:  N/A;   CYSTOSCOPY W/ RETROGRADES Bilateral 12/25/2018   Procedure: CYSTOSCOPY WITH RETROGRADE PYELOGRAM;  Surgeon: Penne Knee, MD;  Location: [facility];  Service: Urology;  Laterality: Bilateral;   FRACTURE SURGERY Right 1958   arm and left wrist compound fracture, no metal   HERNIA REPAIR Right    inguinial   IR IMAGING GUIDED PORT INSERTION  10/15/2022   LEFT HEART CATH AND CORONARY ANGIOGRAPHY N/A 08/27/2017   Procedure: LEFT HEART CATH AND CORONARY ANGIOGRAPHY;  Surgeon: Darron Deatrice LABOR, MD;  Location: [facility];  Service: Cardiovascular;  Laterality: N/A;    PULMONARY THROMBECTOMY Bilateral 04/18/2023   Procedure: PULMONARY THROMBECTOMY;  Surgeon: Marea Selinda RAMAN, MD;  Location: [facility];  Service: Cardiovascular;  Laterality: Bilateral;   RIGHT/LEFT HEART CATH AND CORONARY/GRAFT ANGIOGRAPHY N/A 04/05/2023   Procedure: RIGHT/LEFT HEART CATH AND CORONARY/GRAFT ANGIOGRAPHY;  Surgeon: Mady Bruckner, MD;  Location: [facility];  Service: Cardiovascular;  Laterality: N/A;   TEMPORARY PACEMAKER N/A 04/05/2023   Procedure: TEMPORARY PACEMAKER;  Surgeon: Mady Bruckner, MD;  Location: [facility];  Service: Cardiovascular;  Laterality: N/A;   TRANSURETHRAL RESECTION OF BLADDER TUMOR WITH MITOMYCIN -C N/A 12/25/2018   Procedure: TRANSURETHRAL RESECTION OF BLADDER TUMOR WITH Gemcitabine ;  Surgeon: Penne Knee, MD;  Location: [facility];  Service: Urology;  Laterality: N/A;    Family History  Problem Relation Age of Onset   Heart failure Mother    Lung disease Father    Stroke Father    Cervical cancer Maternal Grandmother     Social History:  reports that he quit smoking about 25 years ago. His smoking use included cigarettes. He has been exposed to tobacco smoke. He has quit using smokeless tobacco.  His smokeless tobacco use included snuff. He reports that he does not currently use alcohol. He reports that he does not currently use drugs.  Allergies: No Known Allergies  Medications reviewed.    ROS Full ROS performed and is otherwise negative other than what is stated in HPI   BP 115/74   Pulse 66   Temp 98 F (36.7 C) (Oral)   Ht 6' (1.829 m)   Wt 203 lb 3.2 oz (92.2 kg)   SpO2 97%   BMI 27.56 kg/m   Physical Exam  Alert and oriented x 3, using a cane to walk, normal work of breathing on room air, regular rate and rhythm, abdomen is soft, nontender and nondistended, lower abdominal laparoscopic port sites are well-healed this is inferior to his umbilicus, left groin hernia easily reducible.   No results  found for this or any previous visit (from the past 48 hours). No results found.  Assessment/Plan:  The patient is a 78 year old with significant past medical history including coronary artery disease, history of pulmonary embolism in February now back on Eliquis , carotid stenosis among a myriad of medical problems who has a left inguinal hernia.  He was last seen in June and at that time was adamant about potentially having a minimally invasive repair of his hernia.  I discussed with him then that I would be willing to at least talk with my partners about a minimally invasive repair although I did not think it was the most appropriate repair option for him.  In the interim I did discuss it with all of my partners that operated at [facility] and they agreed that this would not be an appropriate option for him.  At this visit I did offer him  a referral to a tertiary care facility to see if they would be willing to do a minimally invasive repair.  I also further discussed open repair and that the long-term outcomes are similar and actually would likely be better for him given his medical problems.  I again discussed the risk, benefits and alternatives of the procedure including the risk of infection, bleeding, damage to the vas deferens, testicular ischemia, chronic pain and recurrence.  We also discussed the risk inherent with general anesthesia including risk of blood clots, pulmonary and cardiac problems.  I reiterated that he is at high risk given his medical problems and that a open repair may allow him to not have to undergo general anesthesia.  After talking with him about the risks of minimally invasive repair and open repair he is now agreed to proceed with an open repair.  He did have a code event in February so I would like to wait at least 6 months before we entertain any repair.  He will need to see his cardiologist for cardiac risk stratification.  Ideally I would like him to be off of his  Eliquis  3 days prior to the procedure and his aspirin  7 days if possible.  He is also slated to see the vascular surgery team in August due to carotid occlusion and stenosis.  I would like to wait to see if they have a plan for any interventions before we proceed with an inguinal hernia repair.  We will plan to see him again in late August and then we can talk about potentially scheduling surgery.  Interval 40 minutes was spent reviewing the patient's chart, performing a history and physical and discussing the treatment plan with the patient.  Jayson Endow, M.D. [facility]

## 2023-09-20 ENCOUNTER — Telehealth: Payer: Self-pay | Admitting: *Deleted

## 2023-09-20 ENCOUNTER — Encounter (INDEPENDENT_AMBULATORY_CARE_PROVIDER_SITE_OTHER): Admitting: Vascular Surgery

## 2023-09-20 NOTE — Telephone Encounter (Signed)
 Received Cardiac Clearance from Dr. Lonni Meager- Hold Eliquis  for 3 days prior to hernia surgery. Resume Post op day 1. Patient Should be able to come off of anticoagulation by end of August 2025

## 2023-09-22 NOTE — Progress Notes (Unsigned)
 Office Visit    Patient Name: Cory Hess Date of Encounter: 09/23/2023  Primary Care Provider:  Towana Small, FNP Primary Cardiologist:  Lonni Hanson, MD  Cardiology APP:  Vivienne Lonni Ingle, NP   Chief Complaint    78 y.o. male with history of CAD status post CABG x 3 in July 2018, hypertension, hyperlipidemia, alternating bundle branch block, ischemic cardiomyopathy, HFrEF, and PE w/ PEA arrest in 03/2023, who presents for CAD and heart failure follow-up.   Past Medical History   Subjective   Past Medical History:  Diagnosis Date   Alternating RBBB & LBBB    Ankle swelling 2024   Arthritis    neck   Bladder tumor    CAD (coronary artery disease)    a. 08/2017 STEMI/Cath: LM 80ost/d, LAD 100p, LCX 95 ost/p, RCA 60p, 43m, RPDA 80ost; b. 08/2017 CABG x 3: LIMA->LAD, VG->OM, VG->PDA; c. 03/2023 Cardiac Arrest/Cath: Occluded VG->RPDA, patent LIMA->LAD and VG->pLCX. No new native dzs->Med rx. EF 25-35%.   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 08/2017 TEE: EF 30-35%, sept/ant/inf HK, apical AK. 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 12/2017 Echo: EF 45-50%, diff HK; c. 09/2022 Echo: EF 55-60%; d. 03/2023 Echo (in settng of PE): EF 40-45%, GrI DD (imaging poor); e. 03/2023 Echo w/ definity : EF 55-60%, nl RV fxn.   Hyperlipidemia LDL goal <70    Ischemic cardiomyopathy    a. 08/2017 TEE: EF 30-35%; b. 12/2017 Echo: EF 45-50%; c. 09/2022 Echo: EF 55-60%.   Myocardial infarction (HCC) 08/27/2017   Pulmonary embolism (HCC)    a. 03/2023 Cardiac Arrest/PE req thrombectomy - CTA: Moderate right sided arterial clot burden with occlusive clot in the right upper lobe anterior and posterior segmental arteries and a nonoccluding thrombus in the right lower lobe main artery and superior and posterior basal segment arteries.   Shingles    2018 - right side   Vertigo     several months ago   Past Surgical History:  Procedure Laterality Date   CARDIAC CATHETERIZATION     CATARACT EXTRACTION W/PHACO Left 07/18/2018   Procedure: CATARACT EXTRACTION PHACO AND INTRAOCULAR LENS PLACEMENT (IOC)  LEFT;  Surgeon: Myrna Adine Anes, MD;  Location: [facility];  Service: Ophthalmology;  Laterality: Left;   CATARACT EXTRACTION W/PHACO Right 08/14/2018   Procedure: CATARACT EXTRACTION PHACO AND INTRAOCULAR LENS PLACEMENT (IOC)  RIGHT;  Surgeon: Myrna Adine Anes, MD;  Location: [facility];  Service: Ophthalmology;  Laterality: Right;   CORONARY ARTERY BYPASS GRAFT N/A 08/27/2017   Procedure: CORONARY ARTERY BYPASS GRAFTING (CABG) ON PUMP USING LEFT INTERNAL MAMMARY ARTERY AND LEFT GREATER SAPHENOUS VEIN VIA ENDOVEIN HARVEST;  Surgeon: Lucas Dorise POUR, MD;  Location: [facility] OR;  Service: Open Heart Surgery;  Laterality: N/A;   CORONARY/GRAFT ACUTE MI REVASCULARIZATION N/A 08/27/2017   Procedure: Coronary/Graft Acute MI Revascularization;  Surgeon: Darron Deatrice LABOR, MD;  Location: [facility];  Service: Cardiovascular;  Laterality: N/A;   CYSTOSCOPY W/ RETROGRADES Bilateral 12/25/2018   Procedure: CYSTOSCOPY WITH RETROGRADE PYELOGRAM;  Surgeon: Penne Knee, MD;  Location: [facility] ORS;  Service: Urology;  Laterality: Bilateral;   FRACTURE SURGERY Right 1958   arm and left wrist compound fracture, no metal   HERNIA REPAIR Right    inguinial   IR IMAGING GUIDED PORT INSERTION  10/15/2022  LEFT HEART CATH AND CORONARY ANGIOGRAPHY N/A 08/27/2017   Procedure: LEFT HEART CATH AND CORONARY ANGIOGRAPHY;  Surgeon: Darron Deatrice LABOR, MD;  Location: [facility];  Service: Cardiovascular;  Laterality: N/A;   PULMONARY THROMBECTOMY Bilateral 04/18/2023   Procedure: PULMONARY THROMBECTOMY;  Surgeon: Marea Selinda RAMAN, MD;  Location: [facility];  Service: Cardiovascular;  Laterality: Bilateral;   RIGHT/LEFT HEART CATH AND CORONARY/GRAFT ANGIOGRAPHY N/A  04/05/2023   Procedure: RIGHT/LEFT HEART CATH AND CORONARY/GRAFT ANGIOGRAPHY;  Surgeon: Mady Bruckner, MD;  Location: [facility];  Service: Cardiovascular;  Laterality: N/A;   TEMPORARY PACEMAKER N/A 04/05/2023   Procedure: TEMPORARY PACEMAKER;  Surgeon: Mady Bruckner, MD;  Location: [facility];  Service: Cardiovascular;  Laterality: N/A;   TRANSURETHRAL RESECTION OF BLADDER TUMOR WITH MITOMYCIN -C N/A 12/25/2018   Procedure: TRANSURETHRAL RESECTION OF BLADDER TUMOR WITH Gemcitabine ;  Surgeon: Penne Knee, MD;  Location: [facility] OR[facility];  Service: Urology;  Laterality: N/A;    Allergies  No Known Allergies     History of Present Illness      78 y.o. y/o male with above past medical history including hypertension, hyperlipidemia, CAD, alternating bundle branch block, ischemic cardiomyopathy/HFrEF, and PE/PEA arrest 03/2023.  In July 2019, he was admitted with chest pain and new left bundle branch block.  Catheterization showed multivessel CAD and following intra-aortic balloon pump placement, he was transferred to [facility] underwent CABG x 3.  Intraoperative TEE showed an EF of 30 to 35%.  Follow-up echo November 2019, showed improvement in LV function to 45-50%.  He was maintained on Plavix  following his CABG however, this was discontinued in September 2020 in the setting of hematuria and finding of bladder tumor.  He subsequently underwent TURBT in November 2020.  Echocardiogram in August 2024 showed EF of 55 to 60% with moderate LVH and grade 1 diastolic dysfunction.  No significant valvular disease was noted.   In August 2024, he developed lower extremity edema and was placed on torsemide  therapy with improvement in edema.  At the time, he was also undergoing radiation and chemotherapy cancer at the base of his tongue.  Torsemide  was reduced to an as-needed basis.  In February 2025, patient was admitted with out-of-hospital PEA/cardiac arrest.  Emergent diagnostic  catheterization revealed patent LIMA to the LAD and vein graft to the proximal left circumflex, with occlusion of the vein graft to the RPDA, native left main and LAD occlusion, proximal circumflex disease, and severe diffuse RCA disease.  EF was 25 to 35% by ventriculography.  Filling pressures were moderately to severely elevated (right greater than left) and in the setting of bradycardia, temporary wire was placed.  No intervention was required.  CTA of the chest was performed and showed an occlusive clot in the right upper lobe anterior and posterior segmental arteries with nonoccluding thrombus in the right lower lobe main artery and superior posterior basal segmental arteries.  He required thrombectomy with subsequent development of Pseudomonas pneumonia and difficulty weaning from ventilator requiring placement of a tracheostomy.  Echocardiogram initially showed an EF of 40 to 45% though follow-up echo later in admission showed EF of 55 to 60% with normal RV function.  He was eventually discharged to long-term acute care facility on subcutaneous Lovenox .  He has very limited recollection of the events that occurred during his hospitalization and was unaware that he had a pulmonary embolism.  He was d/c'd from rehab on eliquis , but it was stopped at some point for unclear reasons  and was subsequently resumed in June 2025.  His oncologist recommended at least six months of OAC.     Since his last cardiology visit in 07/2023, Mr. Lybrand has f/u w/ general surgery regarding left inguinal hernia and ongoing discomfort.  He is interested in proceeding with an open surgery and plan at this time is to reconvene with surgery in late August to consider timing.  From a cardiac standpoint, he notes that he has done well.  He does not experience chest pain or dyspnea and is capable of achieving greater than 5 metabolic equivalents.  He has had some intermittent positional vertigo, and notes that he has been off of his  Pepcid  for about a month and has noted some indigestion.  He denies palpitations, PND, orthopnea, syncope, edema, or early satiety. Objective   Home Medications    Current Outpatient Medications  Medication Sig Dispense Refill   apixaban  (ELIQUIS ) 5 MG TABS tablet Take 1 tablet (5 mg total) by mouth 2 (two) times daily. 60 tablet 6   aspirin  81 MG chewable tablet Place 1 tablet (81 mg total) into feeding tube daily.     atorvastatin  (LIPITOR ) 80 MG tablet Place 1 tablet (80 mg total) into feeding tube daily. 90 tablet 3   Blood Pressure Monitoring (BLOOD PRESSURE KIT) KIT 1 each by Does not apply route in the morning, at noon, and at bedtime. 1 kit 0   carvedilol  (COREG ) 6.25 MG tablet Take 1 tablet (6.25 mg total) by mouth 2 (two) times daily with a meal. 90 tablet 3   HYDROcodone -acetaminophen  (NORCO/VICODIN) 5-325 MG tablet Take 2 tablets by mouth every 12 (twelve) hours as needed for severe pain (pain score 7-10). 30 tablet 0   levothyroxine  (SYNTHROID ) 100 MCG tablet Take 1 tablet (100 mcg total) by mouth daily before breakfast. 60 tablet 1   losartan  (COZAAR ) 25 MG tablet Take 1 tablet (25 mg total) by mouth daily. 90 tablet 3   famotidine  (PEPCID ) 20 MG tablet Place 1 tablet (20 mg total) into feeding tube daily.     No current facility-administered medications for this visit.     Physical Exam    VS:  BP 120/69 (BP Location: Left Arm, Patient Position: Sitting, Cuff Size: Normal)   Pulse 61   Ht 6' 3 (1.905 m)   Wt 202 lb (91.6 kg)   SpO2 99%   BMI 25.25 kg/m  , BMI Body mass index is 25.25 kg/m.          GEN: Well nourished, well developed, in no acute distress. HEENT: normal. Neck: Supple, no JVD, carotid bruits, or masses. Cardiac: RRR, no murmurs, rubs, or gallops. No clubbing, cyanosis, edema.  Radials 2+/PT 2+ and equal bilaterally.  Respiratory:  Respirations regular and unlabored, clear to auscultation bilaterally. GI: Soft, nontender, nondistended, BS + x  4. MS: no deformity or atrophy. Skin: warm and dry, no rash. Neuro:  Strength and sensation are intact. Psych: Normal affect.  Accessory Clinical Findings    ECG personally reviewed by me today - EKG Interpretation Date/Time:  Friday September 23 2023 13:36:01 EDT Ventricular Rate:  61 PR Interval:  224 QRS Duration:  172 QT Interval:  456 QTC Calculation: 459 R Axis:   -81  Text Interpretation: Sinus rhythm with 1st degree A-V block Left axis deviation Right bundle branch block Confirmed by Vivienne Bruckner [phone]) on 09/23/2023 1:41:28 PM  - no acute changes.  Lab Results  Component Value Date   WBC 4.8 08/23/2023  HGB 11.3 (L) 08/23/2023   HCT 33.7 (L) 08/23/2023   MCV 91.3 08/23/2023   PLT 107 (L) 08/23/2023   Lab Results  Component Value Date   CREATININE 1.33 (H) 08/23/2023   BUN 19 08/23/2023   NA 135 08/23/2023   K 4.1 08/23/2023   CL 104 08/23/2023   CO2 25 08/23/2023   Lab Results  Component Value Date   ALT 18 08/23/2023   AST 22 08/23/2023   ALKPHOS 56 08/23/2023   BILITOT 1.0 08/23/2023   Lab Results  Component Value Date   CHOL 69 04/07/2023   HDL 21 (L) 04/07/2023   LDLCALC 25 04/07/2023   LDLDIRECT 58 12/28/2017   TRIG 138 04/19/2023   CHOLHDL 3.3 04/07/2023    Lab Results  Component Value Date   HGBA1C 5.2 07/06/2023   Lab Results  Component Value Date   TSH 54.900 (H) 08/15/2023       Assessment & Plan    1.  Coronary artery disease/preoperative cardiovascular evaluation: Status post CABG times 04/2017 with diagnostic catheterization February 2025 in the setting of cardiac arrest and pulmonary embolism, revealing known multivessel occlusive native disease with patent LIMA to LAD, patent vein graft to the proximal circumflex, and occluded vein graft to the RPDA.  Most recent echo with EF of 55 to 60% in February 2025.  Patient has done well without chest pain or dyspnea and is capable of achieving at least 5.5 metabolic equivalents without  symptoms or limitations.  He is pending inguinal hernia repair in the coming months.  Considering his prior cardiac history, RCRI calculates to 11% risk of MACE however, in the absence of symptoms, he does not require further ischemic evaluation prior to surgery.  After 6 months of therapy, he has been cleared by hematology to hold Eliquis  for 3 days prior to surgery with plan to resume postoperatively.  Ideally, we would like to see him continue aspirin  throughout the perioperative period.  Continue beta-blocker, ARB, and statin.  2.  Chronic heart failure with improved ejection fraction/ischemic cardiomyopathy: EF previously as low as 35% at the time of his bypass surgery in 2019 with subsequent improvement by echo in August 2024.  In the setting of PEA arrest and pulmonary embolism, initial echo showed EF of 40 to 45% there was noted the imaging quality was poor and follow-up echo with Definity  showed an EF of 55 to 60%.  He is euvolemic on examination today and is tolerating beta-blocker and ARB.  3.  Pulmonary embolism/PEA arrest: Patient hospitalized in February 2025 with cardiac arrest in the setting of pulseless electrical activity and asystole, and subsequent finding of occlusive right upper lobe pulmonary embolism requiring thrombectomy.  Hospital course complicated by ongoing respiratory failure and Pseudomonas pneumonia requiring eventual tracheostomy and subsequent discharged to LTAC.  There was a brief period of time following his discharge from rehab that he was off of Eliquis  though this was resumed following cardiology visit in June.  He has since followed with hematology with recommendation for at least 6 to 12 months of therapy.  4.  Primary hypertension: Stable on beta-blocker and ARB.  5.  Hyperlipidemia: On atorvastatin  with an LDL of 25 earlier this year and normal LFTs in May.  6.  Alternating bundle branch block: History of IVCD/left bundle branch block as well as intermittent  right bundle branch block.  Briefly required temporary wire in the setting of PEA and asystolic arrest in February 2025.  Sinus rhythm, first-degree AV block,  and right bundle branch block with left axis deviation on ECG today.  Overall unchanged and stable on low-dose beta-blocker.  7.  Abdominal hernia: See 1.  8.  Disposition: Follow-up in 3 months or sooner if necessary.  Lonni Meager, NP 09/23/2023, 2:00 PM

## 2023-09-23 ENCOUNTER — Encounter: Payer: Self-pay | Admitting: Nurse Practitioner

## 2023-09-23 ENCOUNTER — Ambulatory Visit: Attending: Nurse Practitioner | Admitting: Nurse Practitioner

## 2023-09-23 VITALS — BP 120/69 | HR 61 | Ht 75.0 in | Wt 202.0 lb

## 2023-09-23 DIAGNOSIS — I25810 Atherosclerosis of coronary artery bypass graft(s) without angina pectoris: Secondary | ICD-10-CM | POA: Insufficient documentation

## 2023-09-23 DIAGNOSIS — I5032 Chronic diastolic (congestive) heart failure: Secondary | ICD-10-CM | POA: Insufficient documentation

## 2023-09-23 DIAGNOSIS — I255 Ischemic cardiomyopathy: Secondary | ICD-10-CM | POA: Insufficient documentation

## 2023-09-23 DIAGNOSIS — I2699 Other pulmonary embolism without acute cor pulmonale: Secondary | ICD-10-CM | POA: Insufficient documentation

## 2023-09-23 DIAGNOSIS — I454 Nonspecific intraventricular block: Secondary | ICD-10-CM | POA: Diagnosis not present

## 2023-09-23 DIAGNOSIS — Z01810 Encounter for preprocedural cardiovascular examination: Secondary | ICD-10-CM | POA: Insufficient documentation

## 2023-09-23 DIAGNOSIS — I10 Essential (primary) hypertension: Secondary | ICD-10-CM | POA: Insufficient documentation

## 2023-09-23 DIAGNOSIS — E785 Hyperlipidemia, unspecified: Secondary | ICD-10-CM | POA: Insufficient documentation

## 2023-09-23 MED ORDER — FAMOTIDINE 20 MG PO TABS: 20.0000 mg | ORAL_TABLET | Freq: Every day | ORAL | Status: DC

## 2023-09-23 NOTE — Patient Instructions (Signed)
 Medication Instructions:  Your physician recommends that you continue on your current medications as directed. Please refer to the Current Medication list given to you today.   Prescription sent to Pharmacy for Pepcid  *If you need a refill on your cardiac medications before your next appointment, please call your pharmacy*  Lab Work:  No labs ordered today   If you have labs (blood work) drawn today and your tests are completely normal, you will receive your results only by: MyChart Message (if you have MyChart) OR A paper copy in the mail If you have any lab test that is abnormal or we need to change your treatment, we will call you to review the results.  Testing/Procedures:  No test ordered today   Follow-Up: At [facility], you and your health needs are our priority.  As part of our continuing mission to provide you with exceptional heart care, our providers are all part of one team.  This team includes your primary Cardiologist (physician) and Advanced Practice Providers or APPs (Physician Assistants and Nurse Practitioners) who all work together to provide you with the care you need, when you need it.  Your next appointment:   3 month(s)  Provider:   You may see Lonni Hanson, MD or one of the following Advanced Practice Providers on your designated Care Team:   Lonni Meager, NP

## 2023-09-24 ENCOUNTER — Other Ambulatory Visit: Payer: Self-pay

## 2023-09-28 ENCOUNTER — Other Ambulatory Visit: Payer: Self-pay | Admitting: Nurse Practitioner

## 2023-09-28 MED ORDER — FAMOTIDINE 20 MG PO TABS: 20.0000 mg | ORAL_TABLET | Freq: Every day | ORAL | 1 refills | Status: DC

## 2023-10-03 ENCOUNTER — Other Ambulatory Visit: Payer: Self-pay | Admitting: Nurse Practitioner

## 2023-10-04 NOTE — Telephone Encounter (Signed)
 LMOVM to verify nitroglycerin  refill request.

## 2023-10-05 NOTE — Telephone Encounter (Signed)
 Spoke with pt this afternoon pt mentioned that he doesn't need this medication filled at this time.

## 2023-10-10 ENCOUNTER — Telehealth: Payer: Self-pay | Admitting: Family Medicine

## 2023-10-10 NOTE — Telephone Encounter (Signed)
 Lvm for [facility] supply. Patient does not need or want a back or elbow support brace.Physician will not send sign approval due to patient not having a diagnosis need for the brace and patient states he does not want.

## 2023-10-18 ENCOUNTER — Ambulatory Visit (INDEPENDENT_AMBULATORY_CARE_PROVIDER_SITE_OTHER): Admitting: Vascular Surgery

## 2023-10-18 ENCOUNTER — Encounter (INDEPENDENT_AMBULATORY_CARE_PROVIDER_SITE_OTHER): Payer: Self-pay | Admitting: Vascular Surgery

## 2023-10-18 VITALS — BP 114/65 | HR 67 | Ht 75.0 in | Wt 202.0 lb

## 2023-10-18 DIAGNOSIS — C01 Malignant neoplasm of base of tongue: Secondary | ICD-10-CM

## 2023-10-18 DIAGNOSIS — I10 Essential (primary) hypertension: Secondary | ICD-10-CM | POA: Diagnosis not present

## 2023-10-18 DIAGNOSIS — I6529 Occlusion and stenosis of unspecified carotid artery: Secondary | ICD-10-CM | POA: Insufficient documentation

## 2023-10-18 DIAGNOSIS — I2699 Other pulmonary embolism without acute cor pulmonale: Secondary | ICD-10-CM

## 2023-10-18 DIAGNOSIS — I2510 Atherosclerotic heart disease of native coronary artery without angina pectoris: Secondary | ICD-10-CM

## 2023-10-18 DIAGNOSIS — I6523 Occlusion and stenosis of bilateral carotid arteries: Secondary | ICD-10-CM | POA: Diagnosis not present

## 2023-10-18 NOTE — Assessment & Plan Note (Signed)
 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

## 2023-10-18 NOTE — H&P (View-Only) (Signed)
 Patient ID: Cory Hess, male   DOB: 01/11/1946, 78 y.o.   MRN: [id number]  Chief Complaint  Patient presents with   Carotid     - DR MAREA OKAY TO SEE PT EARLY np. conuslt. right carotid artery occlusion. CT neck in epic. brahmanday.     HPI Cory Hess is a 78 y.o. male.  I am asked to see the patient by Dr. Rennie for evaluation of carotid stenosis.  The patient had a CT scan of the neck performed for oncologic reasons.  Although this was not a CT angiogram, it was with contrast.  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  I have independently reviewed this scan and would generally agree with this assessment.  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  The left carotid artery appears to have more mild degree of stenosis.  He has not had a stroke, mini stroke, or other focal neurologic symptoms. Specifically, the patient denies amaurosis fugax, speech or swallowing difficulties, or arm or leg weakness or numbness    Past Medical History:  Diagnosis Date   Alternating RBBB & LBBB    Ankle swelling 2024   Arthritis    neck   Bladder tumor    CAD (coronary artery disease)    a. 08/2017 STEMI/Cath: LM 80ost/d, LAD 100p, LCX 95 ost/p, RCA 60p, 54m, RPDA 80ost; b. 08/2017 CABG x 3: LIMA->LAD, VG->OM, VG->PDA; c. 03/2023 Cardiac Arrest/Cath: Occluded VG->RPDA, patent LIMA->LAD and VG->pLCX. No new native dzs->Med rx. EF 25-35%.   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 08/2017 TEE: EF 30-35%, sept/ant/inf HK, apical AK. 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 12/2017 Echo: EF 45-50%, diff HK; c. 09/2022 Echo: EF 55-60%; d. 03/2023 Echo (in settng of PE): EF 40-45%, GrI DD (imaging poor); e. 03/2023  Echo w/ definity : EF 55-60%, nl RV fxn.   Hyperlipidemia LDL goal <70    Ischemic cardiomyopathy    a. 08/2017 TEE: EF 30-35%; b. 12/2017 Echo: EF 45-50%; c. 09/2022 Echo: EF 55-60%.   Myocardial infarction (HCC) 08/27/2017   Pulmonary embolism (HCC)    a. 03/2023 Cardiac Arrest/PE req thrombectomy - CTA: Moderate right sided arterial clot burden with occlusive clot in the right upper lobe anterior and posterior segmental arteries and a nonoccluding thrombus in the right lower lobe main artery and superior and posterior basal segment arteries.   Shingles    2018 - right side   Vertigo    several months ago    Past Surgical History:  Procedure Laterality Date   CARDIAC CATHETERIZATION     CATARACT EXTRACTION W/PHACO Left 07/18/2018   Procedure: CATARACT EXTRACTION PHACO AND INTRAOCULAR LENS PLACEMENT (IOC)  LEFT;  Surgeon: Myrna Adine Anes, MD;  Location: [facility];  Service: Ophthalmology;  Laterality: Left;   CATARACT EXTRACTION W/PHACO Right 08/14/2018   Procedure: CATARACT EXTRACTION PHACO AND INTRAOCULAR LENS PLACEMENT (IOC)  RIGHT;  Surgeon: Myrna Adine Anes, MD;  Location: [facility];  Service: Ophthalmology;  Laterality: Right;   CORONARY ARTERY BYPASS GRAFT N/A 08/27/2017   Procedure: CORONARY ARTERY BYPASS GRAFTING (CABG) ON PUMP USING LEFT INTERNAL MAMMARY ARTERY AND LEFT GREATER SAPHENOUS VEIN  VIA ENDOVEIN HARVEST;  Surgeon: Lucas Dorise POUR, MD;  Location: [facility] OR;  Service: Open Heart Surgery;  Laterality: N/A;   CORONARY/GRAFT ACUTE MI REVASCULARIZATION N/A 08/27/2017   Procedure: Coronary/Graft Acute MI Revascularization;  Surgeon: Darron Deatrice LABOR, MD;  Location: [facility];  Service: Cardiovascular;  Laterality: N/A;   CYSTOSCOPY W/ RETROGRADES Bilateral 12/25/2018   Procedure: CYSTOSCOPY WITH RETROGRADE PYELOGRAM;  Surgeon: Penne Knee, MD;  Location: [facility];  Service: Urology;  Laterality: Bilateral;   FRACTURE SURGERY Right 1958   arm and left  wrist compound fracture, no metal   HERNIA REPAIR Right    inguinial   IR IMAGING GUIDED PORT INSERTION  10/15/2022   LEFT HEART CATH AND CORONARY ANGIOGRAPHY N/A 08/27/2017   Procedure: LEFT HEART CATH AND CORONARY ANGIOGRAPHY;  Surgeon: Darron Deatrice LABOR, MD;  Location: [facility];  Service: Cardiovascular;  Laterality: N/A;   PULMONARY THROMBECTOMY Bilateral 04/18/2023   Procedure: PULMONARY THROMBECTOMY;  Surgeon: Marea Selinda RAMAN, MD;  Location: [facility];  Service: Cardiovascular;  Laterality: Bilateral;   RIGHT/LEFT HEART CATH AND CORONARY/GRAFT ANGIOGRAPHY N/A 04/05/2023   Procedure: RIGHT/LEFT HEART CATH AND CORONARY/GRAFT ANGIOGRAPHY;  Surgeon: Mady Bruckner, MD;  Location: [facility];  Service: Cardiovascular;  Laterality: N/A;   TEMPORARY PACEMAKER N/A 04/05/2023   Procedure: TEMPORARY PACEMAKER;  Surgeon: Mady Bruckner, MD;  Location: [facility];  Service: Cardiovascular;  Laterality: N/A;   TRANSURETHRAL RESECTION OF BLADDER TUMOR WITH MITOMYCIN -C N/A 12/25/2018   Procedure: TRANSURETHRAL RESECTION OF BLADDER TUMOR WITH Gemcitabine ;  Surgeon: Penne Knee, MD;  Location: [facility];  Service: Urology;  Laterality: N/A;     Family History  Problem Relation Age of Onset   Heart failure Mother    Lung disease Father    Stroke Father    Cervical cancer Maternal Grandmother       Social History   Tobacco Use   Smoking status: Former    Current packs/day: 0.00    Types: Cigarettes    Quit date: 2000    Years since quitting: 25.6    Passive exposure: Past   Smokeless tobacco: Former    Types: Snuff  Vaping Use   Vaping status: Never Used  Substance Use Topics   Alcohol use: Not Currently   Drug use: Not Currently     No Known Allergies  Current Outpatient Medications  Medication Sig Dispense Refill   apixaban  (ELIQUIS ) 5 MG TABS tablet Take 1 tablet (5 mg total) by mouth 2 (two) times daily. 60 tablet 6   aspirin  81 MG  chewable tablet Place 1 tablet (81 mg total) into feeding tube daily.     atorvastatin  (LIPITOR ) 80 MG tablet Place 1 tablet (80 mg total) into feeding tube daily. 90 tablet 3   Blood Pressure Monitoring (BLOOD PRESSURE KIT) KIT 1 each by Does not apply route in the morning, at noon, and at bedtime. 1 kit 0   carvedilol  (COREG ) 6.25 MG tablet Take 1 tablet (6.25 mg total) by mouth 2 (two) times daily with a meal. 90 tablet 3   famotidine  (PEPCID ) 20 MG tablet Place 1 tablet (20 mg total) into feeding tube daily. 90 tablet 1   levothyroxine  (SYNTHROID ) 100 MCG tablet Take 1 tablet (100 mcg total) by mouth daily before breakfast. 60 tablet 1   losartan  (COZAAR ) 25 MG tablet Take 1 tablet (25 mg total) by mouth daily. 90 tablet 3   HYDROcodone -acetaminophen  (NORCO/VICODIN) 5-325 MG tablet Take  2 tablets by mouth every 12 (twelve) hours as needed for severe pain (pain score 7-10). (Patient not taking: Reported on 10/18/2023) 30 tablet 0   No current facility-administered medications for this visit.      REVIEW OF SYSTEMS (Negative unless checked)  Constitutional: [] Weight loss  [] Fever  [] Chills Cardiac: [] Chest pain   [] Chest pressure   [] Palpitations   [] Shortness of breath when laying flat   [] Shortness of breath at rest   [x] Shortness of breath with exertion. Vascular:  [] Pain in legs with walking   [] Pain in legs at rest   [] Pain in legs when laying flat   [] Claudication   [] Pain in feet when walking  [] Pain in feet at rest  [] Pain in feet when laying flat   [x] History of DVT   [x] Phlebitis   [x] Swelling in legs   [] Varicose veins   [] Non-healing ulcers Pulmonary:   [] Uses home oxygen   [] Productive cough   [] Hemoptysis   [] Wheeze  [] COPD   [] Asthma Neurologic:  [] Dizziness  [] Blackouts   [] Seizures   [] History of stroke   [] History of TIA  [] Aphasia   [] Temporary blindness   [] Dysphagia   [] Weakness or numbness in arms   [] Weakness or numbness in legs Musculoskeletal:  [x] Arthritis   [] Joint  swelling   [x] Joint pain   [] Low back pain Hematologic:  [] Easy bruising  [] Easy bleeding   [] Hypercoagulable state   [] Anemic  [] Hepatitis Gastrointestinal:  [] Blood in stool   [] Vomiting blood  [] Gastroesophageal reflux/heartburn   [] Abdominal pain Genitourinary:  [] Chronic kidney disease   [] Difficult urination  [x] Frequent urination  [] Burning with urination   [] Hematuria Skin:  [] Rashes   [] Ulcers   [] Wounds Psychological:  [] History of anxiety   []  History of major depression.    Physical Exam BP 114/65   Pulse 67   Ht 6' 3 (1.905 m)   Wt 202 lb (91.6 kg)   BMI 25.25 kg/m  Gen:  WD/WN, NAD Head: [place]/AT, No temporalis wasting.  Ear/Nose/Throat: Hearing grossly intact, nares w/o erythema or drainage, oropharynx w/o Erythema/Exudate Eyes: Conjunctiva clear, sclera non-icteric  Neck: trachea midline.  No JVD.  Pulmonary:  Good air movement, respirations not labored, no use of accessory muscles  Cardiac: irregular Vascular:  Vessel Right Left  Radial Palpable Palpable                                   Gastrointestinal:. No masses, surgical incisions, or scars. Musculoskeletal: M/S 5/5 throughout.  Extremities without ischemic changes.  No deformity or atrophy. No edema. Neurologic: Sensation grossly intact in extremities.  Symmetrical.  Speech is fluent. Motor exam as listed above. Psychiatric: Judgment intact, Mood & affect appropriate for pt's clinical situation. Dermatologic: No rashes or ulcers noted.  No cellulitis or open wounds.    Radiology No results found.  Labs Recent Results (from the past 2160 hours)  TSH Rfx on Abnormal to Free T4     Status: Abnormal   Collection Time: 08/15/23  3:05 PM  Result Value Ref Range   TSH 54.900 (H) 0.450 - 4.500 uIU/mL  T4F     Status: Abnormal   Collection Time: 08/15/23  3:05 PM  Result Value Ref Range   T4,Free (Direct) 0.74 (L) 0.82 - 1.77 ng/dL  Glucose, capillary     Status: None   Collection Time: 08/17/23   2:18 PM  Result Value Ref Range   Glucose-Capillary 99  70 - 99 mg/dL    Comment: Glucose reference range applies only to samples taken after fasting for at least 8 hours.  Comprehensive metabolic panel     Status: Abnormal   Collection Time: 08/17/23  2:36 PM  Result Value Ref Range   Sodium 136 135 - 145 mmol/L   Potassium 4.7 3.5 - 5.1 mmol/L   Chloride 104 98 - 111 mmol/L   CO2 25 22 - 32 mmol/L   Glucose, Bld 110 (H) 70 - 99 mg/dL    Comment: Glucose reference range applies only to samples taken after fasting for at least 8 hours.   BUN 19 8 - 23 mg/dL   Creatinine, Ser 8.68 (H) 0.61 - 1.24 mg/dL   Calcium  9.2 8.9 - 10.3 mg/dL   Total Protein 7.3 6.5 - 8.1 g/dL   Albumin  4.3 3.5 - 5.0 g/dL   AST 18 15 - 41 U/L   ALT 15 0 - 44 U/L   Alkaline Phosphatase 62 38 - 126 U/L   Total Bilirubin 0.7 0.0 - 1.2 mg/dL   GFR, Estimated 56 (L) >60 mL/min    Comment: (NOTE) Calculated using the CKD-EPI Creatinine Equation (2021)    Anion gap 7 5 - 15    Comment: Performed at [facility], [address], [place], [place] [address]  CBC with Differential     Status: Abnormal   Collection Time: 08/17/23  2:36 PM  Result Value Ref Range   WBC 5.4 4.0 - 10.5 K/uL   RBC 3.97 (L) 4.22 - 5.81 MIL/uL   Hemoglobin 12.0 (L) 13.0 - 17.0 g/dL   HCT 63.6 (L) 60.9 - 47.9 %   MCV 91.4 80.0 - 100.0 fL   MCH 30.2 26.0 - 34.0 pg   MCHC 33.1 30.0 - 36.0 g/dL   RDW 86.0 88.4 - 84.4 %   Platelets 122 (L) 150 - 400 K/uL   nRBC 0.0 0.0 - 0.2 %   Neutrophils Relative % 66 %   Neutro Abs 3.5 1.7 - 7.7 K/uL   Lymphocytes Relative 19 %   Lymphs Abs 1.1 0.7 - 4.0 K/uL   Monocytes Relative 10 %   Monocytes Absolute 0.5 0.1 - 1.0 K/uL   Eosinophils Relative 4 %   Eosinophils Absolute 0.2 0.0 - 0.5 K/uL   Basophils Relative 1 %   Basophils Absolute 0.0 0.0 - 0.1 K/uL   Immature Granulocytes 0 %   Abs Immature Granulocytes 0.02 0.00 - 0.07 K/uL    Comment: Performed at [facility], [address] [place], [place] [address]  CBC with Differential (Cancer Center Only)     Status: Abnormal   Collection Time: 08/23/23 12:41 PM  Result Value Ref Range   WBC Count 4.8 4.0 - 10.5 K/uL   RBC 3.69 (L) 4.22 - 5.81 MIL/uL   Hemoglobin 11.3 (L) 13.0 - 17.0 g/dL   HCT 66.2 (L) 60.9 - 47.9 %   MCV 91.3 80.0 - 100.0 fL   MCH 30.6 26.0 - 34.0 pg   MCHC 33.5 30.0 - 36.0 g/dL   RDW 86.2 88.4 - 84.4 %   Platelet Count 107 (L) 150 - 400 K/uL   nRBC 0.0 0.0 - 0.2 %   Neutrophils Relative % 62 %   Neutro Abs 3.0 1.7 - 7.7 K/uL   Lymphocytes Relative 22 %   Lymphs Abs 1.0 0.7 - 4.0 K/uL   Monocytes Relative 11 %   Monocytes Absolute 0.5  0.1 - 1.0 K/uL   Eosinophils Relative 4 %   Eosinophils Absolute 0.2 0.0 - 0.5 K/uL   Basophils Relative 1 %   Basophils Absolute 0.0 0.0 - 0.1 K/uL   Immature Granulocytes 0 %   Abs Immature Granulocytes 0.01 0.00 - 0.07 K/uL    Comment: Performed at [facility], [address] [place], [place] [address]  Magnesium      Status: None   Collection Time: 08/23/23 12:41 PM  Result Value Ref Range   Magnesium  2.2 1.7 - 2.4 mg/dL    Comment: Performed at [facility], [address] [place], [place] [address]  Phosphorus     Status: None   Collection Time: 08/23/23 12:41 PM  Result Value Ref Range   Phosphorus 3.2 2.5 - 4.6 mg/dL    Comment: Performed at [facility], [address] [place], [place] [address]  CMP (Cancer Center only)     Status: Abnormal   Collection Time: 08/23/23 12:42 PM  Result Value Ref Range   Sodium 135 135 - 145 mmol/L   Potassium 4.1 3.5 - 5.1 mmol/L   Chloride 104 98 - 111 mmol/L   CO2 25 22 - 32 mmol/L   Glucose, Bld 99 70 - 99 mg/dL    Comment: Glucose reference range applies only to samples taken after fasting for at least 8 hours.   BUN 19 8 - 23 mg/dL   Creatinine 8.66 (H) 9.38 - 1.24 mg/dL   Calcium  8.8 (L) 8.9 - 10.3 mg/dL   Total Protein 6.7 6.5 - 8.1 g/dL   Albumin  4.1 3.5 -  5.0 g/dL   AST 22 15 - 41 U/L   ALT 18 0 - 44 U/L   Alkaline Phosphatase 56 38 - 126 U/L   Total Bilirubin 1.0 0.0 - 1.2 mg/dL   GFR, Estimated 55 (L) >60 mL/min    Comment: (NOTE) Calculated using the CKD-EPI Creatinine Equation (2021)    Anion gap 6 5 - 15    Comment: Performed at [facility], [address] [place], [place] [address]    Assessment/Plan:  Carotid stenosis The patient had a CT scan of the neck performed for oncologic reasons.  Although this was not a CT angiogram, it was with contrast.  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  I have independently reviewed this scan and would generally agree with this assessment.  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  The left carotid artery appears to have more mild degree of stenosis. Given this finding, consideration for intervention or surgery to the right carotid artery should be givens for stroke risk reduction.  This was not a CT angiogram but it does appear to have significant lesion.  I think it appropriate next step would be a catheter-based angiogram with intention to treat the right carotid artery if indeed a greater than 75% stenosis is present.  I have discussed the risks and benefits of the procedure.  I discussed that if a lesser degree of stenosis is identified, medical management may be recommended.  I have also discussed if anatomy is present that is not amenable to endovascular therapy, we would just do a diagnostic angiogram and then consider carotid endarterectomy in the future.  Patient voices his understanding and is agreeable to proceed.  Essential hypertension blood pressure  control important in reducing the progression of atherosclerotic disease. On appropriate oral medications.   Pulmonary embolus (HCC) Had pulmonary  thrombectomy about 6 months ago and doing well.  He continues on Eliquis .  Coronary artery disease Multiple previous cardiac issues and avoidance of general anesthesia and treating his carotid disease endovascularly would be preferred if the anatomy is appropriate.  Cancer of base of tongue (HCC) Scan was actually to follow this.  Follows with oncology.  Could be a contributing factor.      Selinda Gu 10/18/2023, 3:09 PM   This note was created with Dragon medical transcription system.  Any errors from dictation are unintentional.

## 2023-10-18 NOTE — Assessment & Plan Note (Signed)
 Scan was actually to follow this.  Follows with oncology.  Could be a contributing factor.

## 2023-10-18 NOTE — Assessment & Plan Note (Signed)
 blood pressure control important in reducing the progression of atherosclerotic disease. On appropriate oral medications.

## 2023-10-18 NOTE — Progress Notes (Signed)
 Patient ID: Cory Hess, male   DOB: 01/11/1946, 78 y.o.   MRN: [id number]  Chief Complaint  Patient presents with   Carotid     - DR MAREA OKAY TO SEE PT EARLY np. conuslt. right carotid artery occlusion. CT neck in epic. brahmanday.     HPI Cory Hess is a 78 y.o. male.  I am asked to see the patient by Dr. Rennie for evaluation of carotid stenosis.  The patient had a CT scan of the neck performed for oncologic reasons.  Although this was not a CT angiogram, it was with contrast.  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  I have independently reviewed this scan and would generally agree with this assessment.  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  The left carotid artery appears to have more mild degree of stenosis.  He has not had a stroke, mini stroke, or other focal neurologic symptoms. Specifically, the patient denies amaurosis fugax, speech or swallowing difficulties, or arm or leg weakness or numbness    Past Medical History:  Diagnosis Date   Alternating RBBB & LBBB    Ankle swelling 2024   Arthritis    neck   Bladder tumor    CAD (coronary artery disease)    a. 08/2017 STEMI/Cath: LM 80ost/d, LAD 100p, LCX 95 ost/p, RCA 60p, 54m, RPDA 80ost; b. 08/2017 CABG x 3: LIMA->LAD, VG->OM, VG->PDA; c. 03/2023 Cardiac Arrest/Cath: Occluded VG->RPDA, patent LIMA->LAD and VG->pLCX. No new native dzs->Med rx. EF 25-35%.   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 08/2017 TEE: EF 30-35%, sept/ant/inf HK, apical AK. 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 12/2017 Echo: EF 45-50%, diff HK; c. 09/2022 Echo: EF 55-60%; d. 03/2023 Echo (in settng of PE): EF 40-45%, GrI DD (imaging poor); e. 03/2023  Echo w/ definity : EF 55-60%, nl RV fxn.   Hyperlipidemia LDL goal <70    Ischemic cardiomyopathy    a. 08/2017 TEE: EF 30-35%; b. 12/2017 Echo: EF 45-50%; c. 09/2022 Echo: EF 55-60%.   Myocardial infarction (HCC) 08/27/2017   Pulmonary embolism (HCC)    a. 03/2023 Cardiac Arrest/PE req thrombectomy - CTA: Moderate right sided arterial clot burden with occlusive clot in the right upper lobe anterior and posterior segmental arteries and a nonoccluding thrombus in the right lower lobe main artery and superior and posterior basal segment arteries.   Shingles    2018 - right side   Vertigo    several months ago    Past Surgical History:  Procedure Laterality Date   CARDIAC CATHETERIZATION     CATARACT EXTRACTION W/PHACO Left 07/18/2018   Procedure: CATARACT EXTRACTION PHACO AND INTRAOCULAR LENS PLACEMENT (IOC)  LEFT;  Surgeon: Myrna Adine Anes, MD;  Location: [facility];  Service: Ophthalmology;  Laterality: Left;   CATARACT EXTRACTION W/PHACO Right 08/14/2018   Procedure: CATARACT EXTRACTION PHACO AND INTRAOCULAR LENS PLACEMENT (IOC)  RIGHT;  Surgeon: Myrna Adine Anes, MD;  Location: [facility];  Service: Ophthalmology;  Laterality: Right;   CORONARY ARTERY BYPASS GRAFT N/A 08/27/2017   Procedure: CORONARY ARTERY BYPASS GRAFTING (CABG) ON PUMP USING LEFT INTERNAL MAMMARY ARTERY AND LEFT GREATER SAPHENOUS VEIN  VIA ENDOVEIN HARVEST;  Surgeon: Lucas Dorise POUR, MD;  Location: [facility] OR;  Service: Open Heart Surgery;  Laterality: N/A;   CORONARY/GRAFT ACUTE MI REVASCULARIZATION N/A 08/27/2017   Procedure: Coronary/Graft Acute MI Revascularization;  Surgeon: Darron Deatrice LABOR, MD;  Location: [facility];  Service: Cardiovascular;  Laterality: N/A;   CYSTOSCOPY W/ RETROGRADES Bilateral 12/25/2018   Procedure: CYSTOSCOPY WITH RETROGRADE PYELOGRAM;  Surgeon: Penne Knee, MD;  Location: [facility];  Service: Urology;  Laterality: Bilateral;   FRACTURE SURGERY Right 1958   arm and left  wrist compound fracture, no metal   HERNIA REPAIR Right    inguinial   IR IMAGING GUIDED PORT INSERTION  10/15/2022   LEFT HEART CATH AND CORONARY ANGIOGRAPHY N/A 08/27/2017   Procedure: LEFT HEART CATH AND CORONARY ANGIOGRAPHY;  Surgeon: Darron Deatrice LABOR, MD;  Location: [facility];  Service: Cardiovascular;  Laterality: N/A;   PULMONARY THROMBECTOMY Bilateral 04/18/2023   Procedure: PULMONARY THROMBECTOMY;  Surgeon: Marea Selinda RAMAN, MD;  Location: [facility];  Service: Cardiovascular;  Laterality: Bilateral;   RIGHT/LEFT HEART CATH AND CORONARY/GRAFT ANGIOGRAPHY N/A 04/05/2023   Procedure: RIGHT/LEFT HEART CATH AND CORONARY/GRAFT ANGIOGRAPHY;  Surgeon: Mady Bruckner, MD;  Location: [facility];  Service: Cardiovascular;  Laterality: N/A;   TEMPORARY PACEMAKER N/A 04/05/2023   Procedure: TEMPORARY PACEMAKER;  Surgeon: Mady Bruckner, MD;  Location: [facility];  Service: Cardiovascular;  Laterality: N/A;   TRANSURETHRAL RESECTION OF BLADDER TUMOR WITH MITOMYCIN -C N/A 12/25/2018   Procedure: TRANSURETHRAL RESECTION OF BLADDER TUMOR WITH Gemcitabine ;  Surgeon: Penne Knee, MD;  Location: [facility];  Service: Urology;  Laterality: N/A;     Family History  Problem Relation Age of Onset   Heart failure Mother    Lung disease Father    Stroke Father    Cervical cancer Maternal Grandmother       Social History   Tobacco Use   Smoking status: Former    Current packs/day: 0.00    Types: Cigarettes    Quit date: 2000    Years since quitting: 25.6    Passive exposure: Past   Smokeless tobacco: Former    Types: Snuff  Vaping Use   Vaping status: Never Used  Substance Use Topics   Alcohol use: Not Currently   Drug use: Not Currently     No Known Allergies  Current Outpatient Medications  Medication Sig Dispense Refill   apixaban  (ELIQUIS ) 5 MG TABS tablet Take 1 tablet (5 mg total) by mouth 2 (two) times daily. 60 tablet 6   aspirin  81 MG  chewable tablet Place 1 tablet (81 mg total) into feeding tube daily.     atorvastatin  (LIPITOR ) 80 MG tablet Place 1 tablet (80 mg total) into feeding tube daily. 90 tablet 3   Blood Pressure Monitoring (BLOOD PRESSURE KIT) KIT 1 each by Does not apply route in the morning, at noon, and at bedtime. 1 kit 0   carvedilol  (COREG ) 6.25 MG tablet Take 1 tablet (6.25 mg total) by mouth 2 (two) times daily with a meal. 90 tablet 3   famotidine  (PEPCID ) 20 MG tablet Place 1 tablet (20 mg total) into feeding tube daily. 90 tablet 1   levothyroxine  (SYNTHROID ) 100 MCG tablet Take 1 tablet (100 mcg total) by mouth daily before breakfast. 60 tablet 1   losartan  (COZAAR ) 25 MG tablet Take 1 tablet (25 mg total) by mouth daily. 90 tablet 3   HYDROcodone -acetaminophen  (NORCO/VICODIN) 5-325 MG tablet Take  2 tablets by mouth every 12 (twelve) hours as needed for severe pain (pain score 7-10). (Patient not taking: Reported on 10/18/2023) 30 tablet 0   No current facility-administered medications for this visit.      REVIEW OF SYSTEMS (Negative unless checked)  Constitutional: [] Weight loss  [] Fever  [] Chills Cardiac: [] Chest pain   [] Chest pressure   [] Palpitations   [] Shortness of breath when laying flat   [] Shortness of breath at rest   [x] Shortness of breath with exertion. Vascular:  [] Pain in legs with walking   [] Pain in legs at rest   [] Pain in legs when laying flat   [] Claudication   [] Pain in feet when walking  [] Pain in feet at rest  [] Pain in feet when laying flat   [x] History of DVT   [x] Phlebitis   [x] Swelling in legs   [] Varicose veins   [] Non-healing ulcers Pulmonary:   [] Uses home oxygen   [] Productive cough   [] Hemoptysis   [] Wheeze  [] COPD   [] Asthma Neurologic:  [] Dizziness  [] Blackouts   [] Seizures   [] History of stroke   [] History of TIA  [] Aphasia   [] Temporary blindness   [] Dysphagia   [] Weakness or numbness in arms   [] Weakness or numbness in legs Musculoskeletal:  [x] Arthritis   [] Joint  swelling   [x] Joint pain   [] Low back pain Hematologic:  [] Easy bruising  [] Easy bleeding   [] Hypercoagulable state   [] Anemic  [] Hepatitis Gastrointestinal:  [] Blood in stool   [] Vomiting blood  [] Gastroesophageal reflux/heartburn   [] Abdominal pain Genitourinary:  [] Chronic kidney disease   [] Difficult urination  [x] Frequent urination  [] Burning with urination   [] Hematuria Skin:  [] Rashes   [] Ulcers   [] Wounds Psychological:  [] History of anxiety   []  History of major depression.    Physical Exam BP 114/65   Pulse 67   Ht 6' 3 (1.905 m)   Wt 202 lb (91.6 kg)   BMI 25.25 kg/m  Gen:  WD/WN, NAD Head: [place]/AT, No temporalis wasting.  Ear/Nose/Throat: Hearing grossly intact, nares w/o erythema or drainage, oropharynx w/o Erythema/Exudate Eyes: Conjunctiva clear, sclera non-icteric  Neck: trachea midline.  No JVD.  Pulmonary:  Good air movement, respirations not labored, no use of accessory muscles  Cardiac: irregular Vascular:  Vessel Right Left  Radial Palpable Palpable                                   Gastrointestinal:. No masses, surgical incisions, or scars. Musculoskeletal: M/S 5/5 throughout.  Extremities without ischemic changes.  No deformity or atrophy. No edema. Neurologic: Sensation grossly intact in extremities.  Symmetrical.  Speech is fluent. Motor exam as listed above. Psychiatric: Judgment intact, Mood & affect appropriate for pt's clinical situation. Dermatologic: No rashes or ulcers noted.  No cellulitis or open wounds.    Radiology No results found.  Labs Recent Results (from the past 2160 hours)  TSH Rfx on Abnormal to Free T4     Status: Abnormal   Collection Time: 08/15/23  3:05 PM  Result Value Ref Range   TSH 54.900 (H) 0.450 - 4.500 uIU/mL  T4F     Status: Abnormal   Collection Time: 08/15/23  3:05 PM  Result Value Ref Range   T4,Free (Direct) 0.74 (L) 0.82 - 1.77 ng/dL  Glucose, capillary     Status: None   Collection Time: 08/17/23   2:18 PM  Result Value Ref Range   Glucose-Capillary 99  70 - 99 mg/dL    Comment: Glucose reference range applies only to samples taken after fasting for at least 8 hours.  Comprehensive metabolic panel     Status: Abnormal   Collection Time: 08/17/23  2:36 PM  Result Value Ref Range   Sodium 136 135 - 145 mmol/L   Potassium 4.7 3.5 - 5.1 mmol/L   Chloride 104 98 - 111 mmol/L   CO2 25 22 - 32 mmol/L   Glucose, Bld 110 (H) 70 - 99 mg/dL    Comment: Glucose reference range applies only to samples taken after fasting for at least 8 hours.   BUN 19 8 - 23 mg/dL   Creatinine, Ser 8.68 (H) 0.61 - 1.24 mg/dL   Calcium  9.2 8.9 - 10.3 mg/dL   Total Protein 7.3 6.5 - 8.1 g/dL   Albumin  4.3 3.5 - 5.0 g/dL   AST 18 15 - 41 U/L   ALT 15 0 - 44 U/L   Alkaline Phosphatase 62 38 - 126 U/L   Total Bilirubin 0.7 0.0 - 1.2 mg/dL   GFR, Estimated 56 (L) >60 mL/min    Comment: (NOTE) Calculated using the CKD-EPI Creatinine Equation (2021)    Anion gap 7 5 - 15    Comment: Performed at [facility], [address], [place], [place] [address]  CBC with Differential     Status: Abnormal   Collection Time: 08/17/23  2:36 PM  Result Value Ref Range   WBC 5.4 4.0 - 10.5 K/uL   RBC 3.97 (L) 4.22 - 5.81 MIL/uL   Hemoglobin 12.0 (L) 13.0 - 17.0 g/dL   HCT 63.6 (L) 60.9 - 47.9 %   MCV 91.4 80.0 - 100.0 fL   MCH 30.2 26.0 - 34.0 pg   MCHC 33.1 30.0 - 36.0 g/dL   RDW 86.0 88.4 - 84.4 %   Platelets 122 (L) 150 - 400 K/uL   nRBC 0.0 0.0 - 0.2 %   Neutrophils Relative % 66 %   Neutro Abs 3.5 1.7 - 7.7 K/uL   Lymphocytes Relative 19 %   Lymphs Abs 1.1 0.7 - 4.0 K/uL   Monocytes Relative 10 %   Monocytes Absolute 0.5 0.1 - 1.0 K/uL   Eosinophils Relative 4 %   Eosinophils Absolute 0.2 0.0 - 0.5 K/uL   Basophils Relative 1 %   Basophils Absolute 0.0 0.0 - 0.1 K/uL   Immature Granulocytes 0 %   Abs Immature Granulocytes 0.02 0.00 - 0.07 K/uL    Comment: Performed at [facility], [address] [place], [place] [address]  CBC with Differential (Cancer Center Only)     Status: Abnormal   Collection Time: 08/23/23 12:41 PM  Result Value Ref Range   WBC Count 4.8 4.0 - 10.5 K/uL   RBC 3.69 (L) 4.22 - 5.81 MIL/uL   Hemoglobin 11.3 (L) 13.0 - 17.0 g/dL   HCT 66.2 (L) 60.9 - 47.9 %   MCV 91.3 80.0 - 100.0 fL   MCH 30.6 26.0 - 34.0 pg   MCHC 33.5 30.0 - 36.0 g/dL   RDW 86.2 88.4 - 84.4 %   Platelet Count 107 (L) 150 - 400 K/uL   nRBC 0.0 0.0 - 0.2 %   Neutrophils Relative % 62 %   Neutro Abs 3.0 1.7 - 7.7 K/uL   Lymphocytes Relative 22 %   Lymphs Abs 1.0 0.7 - 4.0 K/uL   Monocytes Relative 11 %   Monocytes Absolute 0.5  0.1 - 1.0 K/uL   Eosinophils Relative 4 %   Eosinophils Absolute 0.2 0.0 - 0.5 K/uL   Basophils Relative 1 %   Basophils Absolute 0.0 0.0 - 0.1 K/uL   Immature Granulocytes 0 %   Abs Immature Granulocytes 0.01 0.00 - 0.07 K/uL    Comment: Performed at [facility], [address] [place], [place] [address]  Magnesium      Status: None   Collection Time: 08/23/23 12:41 PM  Result Value Ref Range   Magnesium  2.2 1.7 - 2.4 mg/dL    Comment: Performed at [facility], [address] [place], [place] [address]  Phosphorus     Status: None   Collection Time: 08/23/23 12:41 PM  Result Value Ref Range   Phosphorus 3.2 2.5 - 4.6 mg/dL    Comment: Performed at [facility], [address] [place], [place] [address]  CMP (Cancer Center only)     Status: Abnormal   Collection Time: 08/23/23 12:42 PM  Result Value Ref Range   Sodium 135 135 - 145 mmol/L   Potassium 4.1 3.5 - 5.1 mmol/L   Chloride 104 98 - 111 mmol/L   CO2 25 22 - 32 mmol/L   Glucose, Bld 99 70 - 99 mg/dL    Comment: Glucose reference range applies only to samples taken after fasting for at least 8 hours.   BUN 19 8 - 23 mg/dL   Creatinine 8.66 (H) 9.38 - 1.24 mg/dL   Calcium  8.8 (L) 8.9 - 10.3 mg/dL   Total Protein 6.7 6.5 - 8.1 g/dL   Albumin  4.1 3.5 -  5.0 g/dL   AST 22 15 - 41 U/L   ALT 18 0 - 44 U/L   Alkaline Phosphatase 56 38 - 126 U/L   Total Bilirubin 1.0 0.0 - 1.2 mg/dL   GFR, Estimated 55 (L) >60 mL/min    Comment: (NOTE) Calculated using the CKD-EPI Creatinine Equation (2021)    Anion gap 6 5 - 15    Comment: Performed at [facility], [address] [place], [place] [address]    Assessment/Plan:  Carotid stenosis The patient had a CT scan of the neck performed for oncologic reasons.  Although this was not a CT angiogram, it was with contrast.  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  I have independently reviewed this scan and would generally agree with this assessment.  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  The left carotid artery appears to have more mild degree of stenosis. Given this finding, consideration for intervention or surgery to the right carotid artery should be givens for stroke risk reduction.  This was not a CT angiogram but it does appear to have significant lesion.  I think it appropriate next step would be a catheter-based angiogram with intention to treat the right carotid artery if indeed a greater than 75% stenosis is present.  I have discussed the risks and benefits of the procedure.  I discussed that if a lesser degree of stenosis is identified, medical management may be recommended.  I have also discussed if anatomy is present that is not amenable to endovascular therapy, we would just do a diagnostic angiogram and then consider carotid endarterectomy in the future.  Patient voices his understanding and is agreeable to proceed.  Essential hypertension blood pressure  control important in reducing the progression of atherosclerotic disease. On appropriate oral medications.   Pulmonary embolus (HCC) Had pulmonary  thrombectomy about 6 months ago and doing well.  He continues on Eliquis .  Coronary artery disease Multiple previous cardiac issues and avoidance of general anesthesia and treating his carotid disease endovascularly would be preferred if the anatomy is appropriate.  Cancer of base of tongue (HCC) Scan was actually to follow this.  Follows with oncology.  Could be a contributing factor.      Selinda Gu 10/18/2023, 3:09 PM   This note was created with Dragon medical transcription system.  Any errors from dictation are unintentional.

## 2023-10-18 NOTE — Assessment & Plan Note (Signed)
 Had pulmonary thrombectomy about 6 months ago and doing well.  He continues on Eliquis .

## 2023-10-18 NOTE — Assessment & Plan Note (Signed)
 The patient had a CT scan of the neck performed for oncologic reasons.  Although this was not a CT angiogram, it was with contrast.  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  I have independently reviewed this scan and would generally agree with this assessment.  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  The left carotid artery appears to have more mild degree of stenosis. Given this finding, consideration for intervention or surgery to the right carotid artery should be givens for stroke risk reduction.  This was not a CT angiogram but it does appear to have significant lesion.  I think it appropriate next step would be a catheter-based angiogram with intention to treat the right carotid artery if indeed a greater than 75% stenosis is present.  I have discussed the risks and benefits of the procedure.  I discussed that if a lesser degree of stenosis is identified, medical management may be recommended.  I have also discussed if anatomy is present that is not amenable to endovascular therapy, we would just do a diagnostic angiogram and then consider carotid endarterectomy in the future.  Patient voices his understanding and is agreeable to proceed.

## 2023-10-19 ENCOUNTER — Telehealth (INDEPENDENT_AMBULATORY_CARE_PROVIDER_SITE_OTHER): Payer: Self-pay

## 2023-10-19 NOTE — Telephone Encounter (Signed)
 I attempted to contact the patient to schedule him for a right carotid stent placement with Dr. Marea. A message was left for a return call.

## 2023-10-20 NOTE — Telephone Encounter (Signed)
 Spoke with the patient and he is scheduled with Dr. Marea on 10/26/23 with a 6:45 am arrival time to the [facility] for a right carotid stent placement. Pre-procedure were discussed and will be mailed.

## 2023-10-25 ENCOUNTER — Ambulatory Visit (INDEPENDENT_AMBULATORY_CARE_PROVIDER_SITE_OTHER): Admitting: General Surgery

## 2023-10-25 ENCOUNTER — Encounter: Payer: Self-pay | Admitting: General Surgery

## 2023-10-25 VITALS — BP 111/68 | HR 65 | Ht 72.0 in | Wt 200.0 lb

## 2023-10-25 DIAGNOSIS — K4090 Unilateral inguinal hernia, without obstruction or gangrene, not specified as recurrent: Secondary | ICD-10-CM | POA: Diagnosis not present

## 2023-10-25 NOTE — Patient Instructions (Signed)
 You have chose to have your hernia repaired. This will be done by Dr. Cornel Diesel at [facility].  If you are on any injectable weight loss medication, you will need to stop taking your GLP-1 injectable (weight loss) medications 8 days before your surgery to avoid any complications with anesthesia.   Please see your (blue) Pre-care information that you have been given today. Our surgery scheduler will call you to verify surgery date and to go over information.   You will need to arrange to be out of work for approximately 1-2 weeks and then you may return with a lifting restriction for 4 more weeks. If you have FMLA or Disability paperwork that needs to be filled out, please have your company fax your paperwork to [phone] or you may drop this by either office. This paperwork will be filled out within 3 days after your surgery has been completed.  You may have a bruise in your groin and also swelling and brusing in your testicle area. You may use ice 4-5 times daily for 15-20 minutes each time. Make sure that you place a barrier between you and the ice pack. To decrease the swelling, you may roll up a bath towel and place it vertically in between your thighs with your testicles resting on the towel. You will want to keep this area elevated as much as possible for several days following surgery.    Inguinal Hernia, Adult Muscles help keep everything in the body in its proper place. But if a weak spot in the muscles develops, something can poke through. That is called a hernia. When this happens in the lower part of the belly (abdomen), it is called an inguinal hernia. (It takes its name from a part of the body in this region called the inguinal canal.) A weak spot in the wall of muscles lets some fat or part of the small intestine bulge through. An inguinal hernia can develop at any age. Men get them more often than women. CAUSES  In adults, an inguinal hernia develops over time. It can be  triggered by: Suddenly straining the muscles of the lower abdomen. Lifting heavy objects. Straining to have a bowel movement. Difficult bowel movements (constipation) can lead to this. Constant coughing. This may be caused by smoking or lung disease. Being overweight. Being pregnant. Working at a job that requires long periods of standing or heavy lifting. Having had an inguinal hernia before. One type can be an emergency situation. It is called a strangulated inguinal hernia. It develops if part of the small intestine slips through the weak spot and cannot get back into the abdomen. The blood supply can be cut off. If that happens, part of the intestine may die. This situation requires emergency surgery. SYMPTOMS  Often, a small inguinal hernia has no symptoms. It is found when a healthcare provider does a physical exam. Larger hernias usually have symptoms.  In adults, symptoms may include: A lump in the groin. This is easier to see when the person is standing. It might disappear when lying down. In men, a lump in the scrotum. Pain or burning in the groin. This occurs especially when lifting, straining or coughing. A dull ache or feeling of pressure in the groin. Signs of a strangulated hernia can include: A bulge in the groin that becomes very painful and tender to the touch. A bulge that turns red or purple. Fever, nausea and vomiting. Inability to have a bowel movement or to pass gas.  DIAGNOSIS  To decide if you have an inguinal hernia, a healthcare provider will probably do a physical examination. This will include asking questions about any symptoms you have noticed. The healthcare provider might feel the groin area and ask you to cough. If an inguinal hernia is felt, the healthcare provider may try to slide it back into the abdomen. Usually no other tests are needed. TREATMENT  Treatments can vary. The size of the hernia makes a difference. Options include: Watchful waiting. This  is often suggested if the hernia is small and you have had no symptoms. No medical procedure will be done unless symptoms develop. You will need to watch closely for symptoms. If any occur, contact your healthcare provider right away. Surgery. This is used if the hernia is larger or you have symptoms. Open surgery. This is usually an outpatient procedure (you will not stay overnight in a hospital). An cut (incision) is made through the skin in the groin. The hernia is put back inside the abdomen. The weak area in the muscles is then repaired by herniorrhaphy or hernioplasty. Herniorrhaphy: in this type of surgery, the weak muscles are sewn back together. Hernioplasty: a patch or mesh is used to close the weak area in the abdominal wall. Laparoscopy. In this procedure, a surgeon makes small incisions. A thin tube with a tiny video camera (called a laparoscope) is put into the abdomen. The surgeon repairs the hernia with mesh by looking with the video camera and using two long instruments. HOME CARE INSTRUCTIONS  After surgery to repair an inguinal hernia: You will need to take pain medicine prescribed by your healthcare provider. Follow all directions carefully. You will need to take care of the wound from the incision. Your activity will be restricted for awhile. This will probably include no heavy lifting for several weeks. You also should not do anything too active for a few weeks. When you can return to work will depend on the type of job that you have. During "watchful waiting" periods, you should: Maintain a healthy weight. Eat a diet high in fiber (fruits, vegetables and whole grains). Drink plenty of fluids to avoid constipation. This means drinking enough water and other liquids to keep your urine clear or pale yellow. Do not lift heavy objects. Do not stand for long periods of time. Quit smoking. This should keep you from developing a frequent cough. SEEK MEDICAL CARE IF:  A bulge  develops in your groin area. You feel pain, a burning sensation or pressure in the groin. This might be worse if you are lifting or straining. You develop a fever of more than 100.5 F (38.1 C). SEEK IMMEDIATE MEDICAL CARE IF:  Pain in the groin increases suddenly. A bulge in the groin gets bigger suddenly and does not go down. For men, there is sudden pain in the scrotum. Or, the size of the scrotum increases. A bulge in the groin area becomes red or purple and is painful to touch. You have nausea or vomiting that does not go away. You feel your heart beating much faster than normal. You cannot have a bowel movement or pass gas. You develop a fever of more than 102.0 F (38.9 C).   This information is not intended to replace advice given to you by your health care provider. Make sure you discuss any questions you have with your health care provider.   Document Released: 06/27/2008 Document Revised: 05/03/2011 Document Reviewed: 08/12/2014 Elsevier Interactive Patient Education [employer]

## 2023-10-26 ENCOUNTER — Encounter: Payer: Self-pay | Admitting: Vascular Surgery

## 2023-10-26 ENCOUNTER — Ambulatory Visit
Admission: RE | Admit: 2023-10-26 | Discharge: 2023-10-26 | Disposition: A | Discharge status: Home / Self Care | Attending: Vascular Surgery | Admitting: Vascular Surgery

## 2023-10-26 ENCOUNTER — Other Ambulatory Visit: Payer: Self-pay

## 2023-10-26 ENCOUNTER — Encounter
Admission: RE | Disposition: A | Discharge status: Home / Self Care | Payer: Self-pay | Source: Home / Self Care | Attending: Vascular Surgery

## 2023-10-26 DIAGNOSIS — I6523 Occlusion and stenosis of bilateral carotid arteries: Secondary | ICD-10-CM | POA: Diagnosis not present

## 2023-10-26 DIAGNOSIS — Z7901 Long term (current) use of anticoagulants: Secondary | ICD-10-CM | POA: Diagnosis not present

## 2023-10-26 DIAGNOSIS — I6521 Occlusion and stenosis of right carotid artery: Secondary | ICD-10-CM

## 2023-10-26 DIAGNOSIS — C01 Malignant neoplasm of base of tongue: Secondary | ICD-10-CM | POA: Diagnosis not present

## 2023-10-26 DIAGNOSIS — Z87891 Personal history of nicotine dependence: Secondary | ICD-10-CM | POA: Diagnosis not present

## 2023-10-26 DIAGNOSIS — I5032 Chronic diastolic (congestive) heart failure: Secondary | ICD-10-CM | POA: Insufficient documentation

## 2023-10-26 DIAGNOSIS — I110 Hypertensive heart disease with heart failure: Secondary | ICD-10-CM | POA: Insufficient documentation

## 2023-10-26 DIAGNOSIS — Z79899 Other long term (current) drug therapy: Secondary | ICD-10-CM | POA: Diagnosis not present

## 2023-10-26 DIAGNOSIS — Z86711 Personal history of pulmonary embolism: Secondary | ICD-10-CM | POA: Insufficient documentation

## 2023-10-26 DIAGNOSIS — I2510 Atherosclerotic heart disease of native coronary artery without angina pectoris: Secondary | ICD-10-CM | POA: Insufficient documentation

## 2023-10-26 DIAGNOSIS — Q2549 Other congenital malformations of aorta: Secondary | ICD-10-CM | POA: Diagnosis not present

## 2023-10-26 LAB — CREATININE, SERUM
Creatinine, Ser: 1.21 mg/dL (ref 0.61–1.24)
GFR, Estimated: >60 mL/min (ref >60)

## 2023-10-26 LAB — BUN: BUN: 17 mg/dL (ref 8–23)

## 2023-10-26 SURGERY — CAROTID PTA/STENT INTERVENTION
Anesthesia: Moderate Sedation | Laterality: Right

## 2023-10-26 MED ORDER — METHYLPREDNISOLONE SODIUM SUCC 125 MG IJ SOLR: 125.0000 mg | Freq: Once | INTRAMUSCULAR | Status: DC | PRN

## 2023-10-26 MED ORDER — HEPARIN SODIUM (PORCINE) 1000 UNIT/ML IJ SOLN
INTRAMUSCULAR | Status: AC
Start: 2023-10-26 — End: 2023-10-26
  Filled 2023-10-26: qty 10

## 2023-10-26 MED ORDER — HEPARIN SODIUM (PORCINE) 1000 UNIT/ML IJ SOLN
INTRAMUSCULAR | Status: DC | PRN
  Administered 2023-10-26: 8000 [IU] via INTRAVENOUS

## 2023-10-26 MED ORDER — MIDAZOLAM HCL 2 MG/2ML IJ SOLN
INTRAMUSCULAR | Status: AC
  Filled 2023-10-26: qty 2

## 2023-10-26 MED ORDER — FENTANYL CITRATE (PF) 100 MCG/2ML IJ SOLN
INTRAMUSCULAR | Status: AC
Start: 2023-10-26 — End: 2023-10-26
  Filled 2023-10-26: qty 2

## 2023-10-26 MED ORDER — FENTANYL CITRATE (PF) 100 MCG/2ML IJ SOLN
INTRAMUSCULAR | Status: AC
  Filled 2023-10-26: qty 2

## 2023-10-26 MED ORDER — FENTANYL CITRATE (PF) 100 MCG/2ML IJ SOLN
INTRAMUSCULAR | Status: DC | PRN
  Administered 2023-10-26 (×2): 25 ug via INTRAVENOUS
  Administered 2023-10-26: 50 ug via INTRAVENOUS

## 2023-10-26 MED ORDER — HEPARIN SOD (PORK) LOCK FLUSH 100 UNIT/ML IV SOLN
INTRAVENOUS | Status: AC
  Filled 2023-10-26: qty 5

## 2023-10-26 MED ORDER — PHENYLEPHRINE HCL-NACL 20-0.9 MG/250ML-% IV SOLN
INTRAVENOUS | Status: AC
  Filled 2023-10-26: qty 250

## 2023-10-26 MED ORDER — DIPHENHYDRAMINE HCL 50 MG/ML IJ SOLN: 50.0000 mg | Freq: Once | INTRAMUSCULAR | Status: DC | PRN

## 2023-10-26 MED ORDER — FAMOTIDINE 20 MG PO TABS: 40.0000 mg | ORAL_TABLET | Freq: Once | ORAL | Status: DC | PRN

## 2023-10-26 MED ORDER — HYDROMORPHONE HCL 1 MG/ML IJ SOLN: 1.0000 mg | Freq: Once | INTRAMUSCULAR | Status: DC | PRN

## 2023-10-26 MED ORDER — CEFAZOLIN SODIUM-DEXTROSE 2-4 GM/100ML-% IV SOLN
INTRAVENOUS | Status: AC
  Filled 2023-10-26: qty 100

## 2023-10-26 MED ORDER — PHENYLEPHRINE 80 MCG/ML (10ML) SYRINGE FOR IV PUSH (FOR BLOOD PRESSURE SUPPORT)
PREFILLED_SYRINGE | INTRAVENOUS | Status: AC
Start: 2023-10-26 — End: 2023-10-26
  Filled 2023-10-26: qty 10

## 2023-10-26 MED ORDER — CEFAZOLIN SODIUM-DEXTROSE 2-4 GM/100ML-% IV SOLN
2.0000 g | INTRAVENOUS | Status: AC
  Administered 2023-10-26: 2 g via INTRAVENOUS

## 2023-10-26 MED ORDER — ONDANSETRON HCL 4 MG/2ML IJ SOLN: 4.0000 mg | Freq: Four times a day (QID) | INTRAMUSCULAR | Status: DC | PRN

## 2023-10-26 MED ORDER — ATROPINE SULFATE 1 MG/10ML IJ SOSY
PREFILLED_SYRINGE | INTRAMUSCULAR | Status: AC
  Filled 2023-10-26: qty 20

## 2023-10-26 MED ORDER — MIDAZOLAM HCL 2 MG/2ML IJ SOLN
INTRAMUSCULAR | Status: DC | PRN
  Administered 2023-10-26: 1 mg via INTRAVENOUS
  Administered 2023-10-26 (×2): .5 mg via INTRAVENOUS

## 2023-10-26 MED ORDER — MIDAZOLAM HCL 2 MG/ML PO SYRP: 8.0000 mg | ORAL_SOLUTION | Freq: Once | ORAL | Status: DC | PRN

## 2023-10-26 MED ORDER — HEPARIN SODIUM (PORCINE) 1000 UNIT/ML IJ SOLN
INTRAMUSCULAR | Status: AC
  Filled 2023-10-26: qty 10

## 2023-10-26 MED ORDER — SODIUM CHLORIDE 0.9 % IV SOLN: INTRAVENOUS | Status: DC

## 2023-10-26 SURGICAL SUPPLY — 16 items
CATH ANGIO 5F 100CM .035 PIG (CATHETERS) IMPLANT
CATH BEACON 5 .035 100 H1 TIP (CATHETERS) IMPLANT
CATH JB2 100CM (CATHETERS) IMPLANT
COVER DRAPE FLUORO 36X44 (DRAPES) IMPLANT
COVER PROBE ULTRASOUND 5X96 (MISCELLANEOUS) IMPLANT
DEVICE PRESTO INFLATION (MISCELLANEOUS) IMPLANT
DEVICE STARCLOSE SE CLOSURE (Vascular Products) IMPLANT
DEVICE TORQUE (MISCELLANEOUS) IMPLANT
GLIDEWIRE ANGLED SS 035X260CM (WIRE) IMPLANT
KIT CAROTID MANIFOLD (MISCELLANEOUS) IMPLANT
PACK ANGIOGRAPHY (CUSTOM PROCEDURE TRAY) ×1 IMPLANT
SHEATH BRITE TIP 6FRX11 (SHEATH) IMPLANT
SHEATH SHUTTLE 6FR (SHEATH) IMPLANT
SYR MEDRAD MARK 7 150ML (SYRINGE) IMPLANT
WIRE G VAS 035X260 STIFF (WIRE) IMPLANT
WIRE J 3MM .035X145CM (WIRE) IMPLANT

## 2023-10-26 NOTE — Progress Notes (Signed)
 Outpatient Surgical Follow Up   Cory Hess is an 78 y.o. male.   Chief Complaint  Patient presents with   Follow-up    HPI: The patient returns today in follow-up of left inguinal hernia.  He reports he continues to have pain in his left inguinal hernia but it is reducible.  He says it does bulge out when he stands up but will spontaneously pushes it and he lays down.  He has significant cardiac history and has undergone cardiac restratification and there is no further studies or procedures needed to optimize him for surgery.  He has also had a history of PE and has completed Eliquis  for 6 months and it is okay to hold for 3 days prior to the procedure.  He is actually scheduled to undergo carotid angiogram with Dr. Marea his vascular surgeon tomorrow and potential carotid stenting.  Past Medical History:  Diagnosis Date   Alternating RBBB & LBBB    Ankle swelling 2024   Arthritis    neck   Bladder tumor    CAD (coronary artery disease)    a. 08/2017 STEMI/Cath: LM 80ost/d, LAD 100p, LCX 95 ost/p, RCA 60p, 39m, RPDA 80ost; b. 08/2017 CABG x 3: LIMA->LAD, VG->OM, VG->PDA; c. 03/2023 Cardiac Arrest/Cath: Occluded VG->RPDA, patent LIMA->LAD and VG->pLCX. No new native dzs->Med rx. EF 25-35%.   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 08/2017 TEE: EF 30-35%, sept/ant/inf HK, apical AK. 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 12/2017 Echo: EF 45-50%, diff HK; c. 09/2022 Echo: EF 55-60%; d. 03/2023 Echo (in settng of PE): EF 40-45%, GrI DD (imaging poor); e. 03/2023 Echo w/ definity : EF 55-60%, nl RV fxn.   Hyperlipidemia LDL goal <70    Ischemic cardiomyopathy    a. 08/2017 TEE: EF 30-35%; b. 12/2017 Echo: EF 45-50%; c. 09/2022 Echo: EF 55-60%.   Myocardial infarction (HCC) 08/27/2017   Pulmonary embolism (HCC)    a. 03/2023 Cardiac Arrest/PE req thrombectomy - CTA: Moderate right sided  arterial clot burden with occlusive clot in the right upper lobe anterior and posterior segmental arteries and a nonoccluding thrombus in the right lower lobe main artery and superior and posterior basal segment arteries.   Shingles    2018 - right side   Vertigo    several months ago    Past Surgical History:  Procedure Laterality Date   CARDIAC CATHETERIZATION     CATARACT EXTRACTION W/PHACO Left 07/18/2018   Procedure: CATARACT EXTRACTION PHACO AND INTRAOCULAR LENS PLACEMENT (IOC)  LEFT;  Surgeon: Myrna Adine Anes, MD;  Location: [facility];  Service: Ophthalmology;  Laterality: Left;   CATARACT EXTRACTION W/PHACO Right 08/14/2018   Procedure: CATARACT EXTRACTION PHACO AND INTRAOCULAR LENS PLACEMENT (IOC)  RIGHT;  Surgeon: Myrna Adine Anes, MD;  Location: [facility];  Service: Ophthalmology;  Laterality: Right;   CORONARY ARTERY BYPASS GRAFT N/A 08/27/2017   Procedure: CORONARY ARTERY BYPASS GRAFTING (CABG) ON PUMP USING LEFT INTERNAL MAMMARY ARTERY AND LEFT GREATER SAPHENOUS VEIN VIA ENDOVEIN HARVEST;  Surgeon: Lucas Dorise POUR, MD;  Location: [facility] OR;  Service: Open Heart Surgery;  Laterality: N/A;   CORONARY/GRAFT ACUTE MI REVASCULARIZATION N/A 08/27/2017   Procedure: Coronary/Graft Acute MI Revascularization;  Surgeon: Darron Deatrice LABOR, MD;  Location: [facility];  Service: Cardiovascular;  Laterality: N/A;   CYSTOSCOPY W/ RETROGRADES  Bilateral 12/25/2018   Procedure: CYSTOSCOPY WITH RETROGRADE PYELOGRAM;  Surgeon: Penne Knee, MD;  Location: [facility];  Service: Urology;  Laterality: Bilateral;   FRACTURE SURGERY Right 1958   arm and left wrist compound fracture, no metal   HERNIA REPAIR Right    inguinial   IR IMAGING GUIDED PORT INSERTION  10/15/2022   LEFT HEART CATH AND CORONARY ANGIOGRAPHY N/A 08/27/2017   Procedure: LEFT HEART CATH AND CORONARY ANGIOGRAPHY;  Surgeon: Darron Deatrice LABOR, MD;  Location: [facility];  Service: Cardiovascular;   Laterality: N/A;   PULMONARY THROMBECTOMY Bilateral 04/18/2023   Procedure: PULMONARY THROMBECTOMY;  Surgeon: Marea Selinda RAMAN, MD;  Location: [facility];  Service: Cardiovascular;  Laterality: Bilateral;   RIGHT/LEFT HEART CATH AND CORONARY/GRAFT ANGIOGRAPHY N/A 04/05/2023   Procedure: RIGHT/LEFT HEART CATH AND CORONARY/GRAFT ANGIOGRAPHY;  Surgeon: Mady Bruckner, MD;  Location: [facility];  Service: Cardiovascular;  Laterality: N/A;   TEMPORARY PACEMAKER N/A 04/05/2023   Procedure: TEMPORARY PACEMAKER;  Surgeon: Mady Bruckner, MD;  Location: [facility];  Service: Cardiovascular;  Laterality: N/A;   TRANSURETHRAL RESECTION OF BLADDER TUMOR WITH MITOMYCIN -C N/A 12/25/2018   Procedure: TRANSURETHRAL RESECTION OF BLADDER TUMOR WITH Gemcitabine ;  Surgeon: Penne Knee, MD;  Location: [facility];  Service: Urology;  Laterality: N/A;    Family History  Problem Relation Age of Onset   Heart failure Mother    Lung disease Father    Stroke Father    Cervical cancer Maternal Grandmother     Social History:  reports that he quit smoking about 25 years ago. His smoking use included cigarettes. He has been exposed to tobacco smoke. He has quit using smokeless tobacco.  His smokeless tobacco use included snuff. He reports that he does not currently use alcohol. He reports that he does not currently use drugs.  Allergies: No Known Allergies  Medications reviewed.    ROS Full ROS performed and is otherwise negative other than what is stated in HPI   BP 111/68   Pulse 65   Ht 6' (1.829 m)   Wt 200 lb (90.7 kg)   SpO2 98%   BMI 27.12 kg/m   Physical Exam Alert and oriented x 3, no work of breathing on room air, regular rate and rhythm, abdomen soft, nontender nondistended, left inguinal hernia is easily reducible when he is standing up and reduces spontaneously when he lays down.    No results found for this or any previous visit (from the past 48  hours). PERIPHERAL VASCULAR CATHETERIZATION Result Date: 10/26/2023 See surgical note for result.   Assessment/Plan:  78 year old male with significant cardiac, vascular disease who is also on Eliquis  for PE who presents with left inguinal hernia.  He has undergone cardiac restratification and is planning for carotid angiogram tomorrow.  I discussed with him that I am willing to proceed with surgery and do an open left inguinal hernia repair.  We discussed the risk, benefits alternatives to the procedure including risk of infection, bleeding, damage to the vas deferens, testicular ischemia, recurrence, chronic pain.  I would like to see the the outcome of his carotid stenting before we proceed with surgery and give him some time after this.  We will plan for mid October to get this done.  He will need to hold his Eliquis  3 days prior to the procedure.  A total of 35 minutes was spent reviewing the patient's chart, performing an interval history and physical and discussing treatment options  with the patient  Jayson Endow, M.D. [facility]

## 2023-10-26 NOTE — Op Note (Signed)
 [facility]   OPERATIVE NOTE  DATE: 10/26/2023  PRE-OPERATIVE DIAGNOSIS: 1. Carotid stenosis  POST-OPERATIVE DIAGNOSIS: Same as above  PROCEDURE: 1.   Ultrasound Guidance for vascular access right femoral artery 2.   Catheter placement into right common carotid artery and into left common carotid artery from right femoral approach 3.   Thoracic aortogram 4.   Cervical and cerebral bilateral carotid angiograms 5.   StarClose closure device right femoral artery  SURGEON: Selinda Gu, MD  ASSISTANT(S): None  ANESTHESIA: Moderate conscious sedation  ESTIMATED BLOOD LOSS: 5 cc  FLUORO TIME: 4 minutes  CONTRAST: 65 cc  MODERATE CONSCIOUS SEDATION TIME: Approximately 32 minutes using 2 of Versed  and 100 mcg of Fentanyl   FINDING(S): 1.  30 to 40% right internal carotid artery stenosis, minimal left carotid artery stenosis.  Normal intracranial flow.  SPECIMEN(S):  None  INDICATIONS:   Patient is a 78 y.o.male who presents with CT scan of the neck suggesting high-grade carotid artery stenosis on the right side.  Angiography was performed with intention to treat if significant disease was found.  This was a regular CT scan and not a CT angiogram so it was a little difficult to discern but it did appear high-grade.  Risks and benefits are discussed and informed consent was obtained.  DESCRIPTION: After obtaining full informed written consent, the patient was brought back to the operating room and placed supine upon the vascular suite table.  After obtaining adequate anesthesia, the patient was prepped and draped in the standard fashion.  Moderate conscious sedation was administered during a face to face encounter with the patient throughout the procedure with my supervision of the RN administering medicines and monitoring the patients vital signs and mental status throughout from the start of the procedure until the patient was taken to the recovery room. The right  femoral artery was visualized with ultrasound and found to be calcific but patent. It was then accessed under direct ultrasound guidance without difficulty with a Seldinger needle. A J-wire and 5 French sheath were placed and a permanent image was recorded. The patient was given 8000 units of intravenous heparin . A pigtail catheter was placed into the ascending aorta and an LAO projection thoracic aortogram was performed. This showed a bovine configuration of the aortic arch with no significant proximal stenosis.  I then selected a headhunter catheter and cannulated the innominate artery advancing into the mid right common carotid artery. Cervical and cerebral carotid angiography were then performed on the right side initially. The intracranial flow was found to be normal with no deficits. The cervical carotid artery was calcific but only demonstrated about a 30 to 40% stenosis of the right internal carotid artery and multiple projections. I then turned my attention back to the thoracic aorta and removed the catheter from the right side. I selectively cannulated the left common carotid artery without difficulty with a JB2 catheter and advanced into the mid left common carotid artery. Selective imaging was then performed of the cervical and cerebral carotid artery on the left. Intracranial filling was seen to be normal with slight left-to-right cross-filling. The cervical carotid artery was fairly normal with only very mild disease of less than 10%. Multiple views were taken in the cervical carotid artery bilaterally. At this point, we had imaging to plan our treatment and we elected to terminate the procedure. The diagnostic catheter was removed. Oblique arteriogram was performed of the right femoral artery and StarClose closure device was deployed in usual  fashion with excellent hemostatic result. The patient tolerated the procedure well and was taken to the recovery room in stable condition.  COMPLICATIONS:  None  CONDITION: Stable   Selinda Gu 10/26/2023 9:30 AM   This note was created with Dragon Medical transcription system. Any errors in dictation are purely unintentional.

## 2023-10-26 NOTE — Progress Notes (Signed)
 Dr. Marea in at bedside to speak with pt. Re: procedure today. Dr. Marea spoke with pt.'s sister Ricka  via phone. Both verbalized understanding of conversation.

## 2023-10-26 NOTE — Interval H&P Note (Signed)
 History and Physical Interval Note:  10/26/2023 8:14 AM  Cory Hess  has presented today for surgery, with the diagnosis of R Carotid Stent   ABBOTT   Carotid artery stenosis.  The various methods of treatment have been discussed with the patient and family. After consideration of risks, benefits and other options for treatment, the patient has consented to  Procedure(s): CAROTID PTA/STENT INTERVENTION (Right) as a surgical intervention.  The patient's history has been reviewed, patient examined, no change in status, stable for surgery.  I have reviewed the patient's chart and labs.  Questions were answered to the patient's satisfaction.     Amil Bouwman

## 2023-10-27 ENCOUNTER — Encounter: Payer: Self-pay | Admitting: Vascular Surgery

## 2023-10-27 ENCOUNTER — Telehealth: Payer: Self-pay | Admitting: General Surgery

## 2023-10-27 MED ORDER — HEPARIN (PORCINE) IN NACL 1000-0.9 UT/500ML-% IV SOLN
INTRAVENOUS | Status: AC | PRN
  Administered 2023-10-26: 1000 mL

## 2023-10-27 MED ORDER — HEPARIN (PORCINE) IN NACL 2000-0.9 UNIT/L-% IV SOLN
INTRAVENOUS | Status: AC | PRN
  Administered 2023-10-26: 1000 mL

## 2023-10-27 MED ORDER — IODIXANOL 320 MG/ML IV SOLN
INTRAVENOUS | Status: AC | PRN
  Administered 2023-10-26: 65 mL

## 2023-10-27 MED ORDER — LIDOCAINE-EPINEPHRINE (PF) 1 %-1:200000 IJ SOLN
INTRAMUSCULAR | Status: AC | PRN
  Administered 2023-10-26: 10 mL

## 2023-10-27 NOTE — Telephone Encounter (Signed)
 Patient has been advised of Pre-Admission date/time, and Surgery date at [facility].  Surgery Date: 12/05/23 Preadmission Testing Date: 11/28/23 (phone 8a-1p)  Patient informed of the scheduling process and surgery information given at time of office visit.   Patient has been made aware to call [phone], between 1-3:00pm the day before surgery, to find out what time to arrive for surgery.

## 2023-10-31 ENCOUNTER — Encounter: Payer: Self-pay | Admitting: Vascular Surgery

## 2023-11-01 ENCOUNTER — Ambulatory Visit: Admitting: Internal Medicine

## 2023-11-01 ENCOUNTER — Inpatient Hospital Stay

## 2023-11-02 ENCOUNTER — Telehealth: Payer: Self-pay

## 2023-11-02 NOTE — Telephone Encounter (Signed)
 Copied from CRM [id number]. Topic: General - Other >> Nov 02, 2023  9:31 AM Berneda FALCON wrote: Reason for CRM: Alex from [employer] is calling to check on the prior auth for a back and both knee brace for the patient. Please call him back when we do receive it.  [phone] extension [phone] >> Nov 02, 2023 11:07 AM Delon DASEN wrote: Marolyn calling back to check status  Recvd many faxes and messages regarding this. WE WILL NOT sign for braces as patient has not asked for these and we have no medical notes to support this request. I called Alex and it rings and never gives an answer. PLEASE do not send CRM regarding these issues.

## 2023-11-02 NOTE — Telephone Encounter (Signed)
 Copied from CRM [id number]. Topic: Clinical - Medication Prior Auth >> Nov 02, 2023 12:38 PM Terri G wrote: Reason for CRM: Alex from [employer] calling regarding fax sent yesterday and this morning regarding prior auth for braces for patient. Callback number [phone] ext [phone]  Will check fax and reply to fax I did try to return call was on hold 11 min.

## 2023-11-02 NOTE — Telephone Encounter (Signed)
 Copied from CRM [id number]. Topic: Medical Record Request - Provider/Facility Request >> Nov 01, 2023  2:17 PM Ivette P wrote: Reason for CRM: Lexi called in to confirm fax number to fax over prior authorization for pt medical equipment.   Pt is requesting bag and knee braces. . Rcvd 3 faxes and 3 calls will need 72 hours to address please. Left vm with company and patient that we are working on this.

## 2023-11-09 ENCOUNTER — Other Ambulatory Visit: Payer: Self-pay | Admitting: Family Medicine

## 2023-11-09 NOTE — Telephone Encounter (Unsigned)
 Copied from CRM [id number]. Topic: Medical Record Request - Other >> Nov 02, 2023  2:10 PM Anairis L wrote: Reason for CRM: Just FYI-Alex, calling from [employer] to see if we received his request. If not he will re-fax. Confirmed fax is correct. >> Nov 09, 2023  3:43 PM Winona SAUNDERS wrote: Marolyn from [employer] calling to follow up with the request of prior authorization for pts medical supplies

## 2023-11-27 ENCOUNTER — Ambulatory Visit: Payer: Self-pay | Admitting: General Surgery

## 2023-11-27 DIAGNOSIS — K4090 Unilateral inguinal hernia, without obstruction or gangrene, not specified as recurrent: Secondary | ICD-10-CM

## 2023-11-28 ENCOUNTER — Other Ambulatory Visit: Payer: Self-pay

## 2023-11-28 ENCOUNTER — Encounter
Admission: RE | Admit: 2023-11-28 | Discharge: 2023-11-28 | Disposition: A | Discharge status: Home / Self Care | Source: Ambulatory Visit | Attending: General Surgery | Admitting: General Surgery

## 2023-11-28 DIAGNOSIS — K4090 Unilateral inguinal hernia, without obstruction or gangrene, not specified as recurrent: Secondary | ICD-10-CM

## 2023-11-28 DIAGNOSIS — I10 Essential (primary) hypertension: Secondary | ICD-10-CM

## 2023-11-28 DIAGNOSIS — I6523 Occlusion and stenosis of bilateral carotid arteries: Secondary | ICD-10-CM

## 2023-11-28 DIAGNOSIS — Z01812 Encounter for preprocedural laboratory examination: Secondary | ICD-10-CM

## 2023-11-28 DIAGNOSIS — D649 Anemia, unspecified: Secondary | ICD-10-CM

## 2023-11-28 DIAGNOSIS — D696 Thrombocytopenia, unspecified: Secondary | ICD-10-CM

## 2023-11-28 DIAGNOSIS — I5023 Acute on chronic systolic (congestive) heart failure: Secondary | ICD-10-CM

## 2023-11-28 DIAGNOSIS — I2510 Atherosclerotic heart disease of native coronary artery without angina pectoris: Secondary | ICD-10-CM

## 2023-11-28 HISTORY — DX: Unilateral inguinal hernia, without obstruction or gangrene, not specified as recurrent: K40.90

## 2023-11-28 HISTORY — DX: Personal history of nicotine dependence: Z87.891

## 2023-11-28 HISTORY — DX: Other diseases of larynx: J38.7

## 2023-11-28 HISTORY — DX: Presence of other vascular implants and grafts: Z95.828

## 2023-11-28 HISTORY — DX: Presence of aortocoronary bypass graft: Z95.1

## 2023-11-28 HISTORY — DX: Gastro-esophageal reflux disease without esophagitis: K21.9

## 2023-11-28 HISTORY — DX: Long term (current) use of anticoagulants: Z79.01

## 2023-11-28 HISTORY — DX: Hypothyroidism, unspecified: E03.9

## 2023-11-28 HISTORY — DX: Thrombocytopenia, unspecified: D69.6

## 2023-11-28 HISTORY — DX: Occlusion and stenosis of unspecified carotid artery: I65.29

## 2023-11-28 NOTE — Patient Instructions (Signed)
 Your procedure is scheduled on:12-05-23 Monday Report to the Registration Desk on the 1st floor of the [facility].Then proceed to the 2nd floor Surgery Desk To find out your arrival time, please call [phone] between 1PM - 3PM on:12-02-23 Friday If your arrival time is 6:00 am, do not arrive before that time as the [facility] entrance doors do not open until 6:00 am.  REMEMBER: Instructions that are not followed completely may result in serious medical risk, up to and including death; or upon the discretion of your surgeon and anesthesiologist your surgery may need to be rescheduled.  Do not eat food OR drink liquids after midnight the night before surgery.  No gum chewing or hard candies.  One week prior to surgery:Stop NOW (11-28-23) Stop Anti-inflammatories (NSAIDS) such as Advil, Aleve, Ibuprofen, Motrin, Naproxen, Naprosyn and Aspirin  based products such as Excedrin, Goody's Powder, BC Powder. Stop ANY OVER THE COUNTER supplements until after surgery (Multivitamin)  You may however, continue to take Tylenol  if needed for pain up until the day of surgery.  Stop apixaban  (ELIQUIS ) 3 days prior to surgery-Last dose will be on 12-01-23 Thursday  Continue taking all of your other prescription medications up until the day of surgery.  ON THE DAY OF SURGERY ONLY TAKE THESE MEDICATIONS WITH SIPS OF WATER : -carvedilol  (COREG )  -famotidine  (PEPCID )  -levothyroxine  (SYNTHROID )   Continue your 81 mg Aspirin  up until the day prior to surgery-Do NOT take the day of surgery  No Alcohol for 24 hours before or after surgery.  No Smoking including e-cigarettes for 24 hours before surgery.  No chewable tobacco products for at least 6 hours before surgery.  No nicotine patches on the day of surgery.  Do not use any recreational drugs for at least a week (preferably 2 weeks) before your surgery.  Please be advised that the combination of cocaine and anesthesia may have negative outcomes,  up to and including death. If you test positive for cocaine, your surgery will be cancelled.  On the morning of surgery brush your teeth with toothpaste and water , you may rinse your mouth with mouthwash if you wish. Do not swallow any toothpaste or mouthwash.  Use CHG Soap as directed on instruction sheet.  Do not wear jewelry, make-up, hairpins, clips or nail polish.  For welded (permanent) jewelry: bracelets, anklets, waist bands, etc.  Please have this removed prior to surgery.  If it is not removed, there is a chance that hospital personnel will need to cut it off on the day of surgery.  Do not wear lotions, powders, or perfumes.   Do not shave body hair from the neck down 48 hours before surgery.  Contact lenses, hearing aids and dentures may not be worn into surgery.  Do not bring valuables to the hospital. [facility] is not responsible for any missing/lost belongings or valuables.   Notify your doctor if there is any change in your medical condition (cold, fever, infection).  Wear comfortable clothing (specific to your surgery type) to the hospital.  After surgery, you can help prevent lung complications by doing breathing exercises.  Take deep breaths and cough every 1-2 hours. Your doctor may order a device called an Incentive Spirometer to help you take deep breaths. When coughing or sneezing, hold a pillow firmly against your incision with both hands. This is called "splinting." Doing this helps protect your incision. It also decreases belly discomfort.  If you are being admitted to the hospital overnight, leave your suitcase  in the car. After surgery it may be brought to your room.  In case of increased patient census, it may be necessary for you, the patient, to continue your postoperative care in the Same Day Surgery department.  If you are being discharged the day of surgery, you will not be allowed to drive home. You will need a responsible individual to drive you  home and stay with you for 24 hours after surgery.   If you are taking public transportation, you will need to have a responsible individual with you.  Please call the Pre-admissions Testing Dept. at [phone] if you have any questions about these instructions.  Surgery Visitation Policy:  Patients having surgery or a procedure may have two visitors.  Children under the age of 77 must have an adult with them who is not the patient.                                                                                                             Preparing for Surgery with CHLORHEXIDINE  GLUCONATE (CHG) Soap  Chlorhexidine  Gluconate (CHG) Soap  o An antiseptic cleaner that kills germs and bonds with the skin to continue killing germs even after washing  o Used for showering the night before surgery and morning of surgery  Before surgery, you can play an important role by reducing the number of germs on your skin.  CHG (Chlorhexidine  gluconate) soap is an antiseptic cleanser which kills germs and bonds with the skin to continue killing germs even after washing.  Please do not use if you have an allergy to CHG or antibacterial soaps. If your skin becomes reddened/irritated stop using the CHG.  1. Shower the NIGHT BEFORE SURGERY with CHG soap.  2. If you choose to wash your hair, wash your hair first as usual with your normal shampoo.  3. After shampooing, rinse your hair and body thoroughly to remove the shampoo.  4. Use CHG as you would any other liquid soap. You can apply CHG directly to the skin and wash gently with a clean washcloth.  5. Apply the CHG soap to your body only from the neck down. Do not use on open wounds or open sores. Avoid contact with your eyes, ears, mouth, and genitals (private parts). Wash face and genitals (private parts) with your normal soap.  6. Wash thoroughly, paying special attention to the area where your surgery will be performed.  7. Thoroughly rinse  your body with warm water .  8. Do not shower/wash with your normal soap after using and rinsing off the CHG soap.  9. Do not use lotions, oils, etc., after showering with CHG.  10. Pat yourself dry with a clean towel.  11. Wear clean pajamas to bed the night before surgery.  12. Place clean sheets on your bed the night of your shower and do not sleep with pets.  13. Do not apply any deodorants/lotions/powders.  14. Please wear clean clothes to the hospital.  15. Remember to brush your teeth with your regular toothpaste.  [employer]ch[employer], retail to address health-related social needs:  [email or web address]

## 2023-11-29 ENCOUNTER — Encounter
Admission: RE | Admit: 2023-11-29 | Discharge: 2023-11-29 | Disposition: A | Discharge status: Home / Self Care | Source: Ambulatory Visit | Attending: General Surgery | Admitting: General Surgery

## 2023-11-29 DIAGNOSIS — I5023 Acute on chronic systolic (congestive) heart failure: Secondary | ICD-10-CM | POA: Insufficient documentation

## 2023-11-29 DIAGNOSIS — Z01812 Encounter for preprocedural laboratory examination: Secondary | ICD-10-CM | POA: Insufficient documentation

## 2023-11-29 DIAGNOSIS — D696 Thrombocytopenia, unspecified: Secondary | ICD-10-CM | POA: Insufficient documentation

## 2023-11-29 DIAGNOSIS — Z01818 Encounter for other preprocedural examination: Secondary | ICD-10-CM | POA: Diagnosis present

## 2023-11-29 DIAGNOSIS — I10 Essential (primary) hypertension: Secondary | ICD-10-CM

## 2023-11-29 DIAGNOSIS — D649 Anemia, unspecified: Secondary | ICD-10-CM | POA: Insufficient documentation

## 2023-11-29 DIAGNOSIS — I2510 Atherosclerotic heart disease of native coronary artery without angina pectoris: Secondary | ICD-10-CM | POA: Diagnosis not present

## 2023-11-29 DIAGNOSIS — I6523 Occlusion and stenosis of bilateral carotid arteries: Secondary | ICD-10-CM | POA: Diagnosis not present

## 2023-11-29 DIAGNOSIS — I110 Hypertensive heart disease with heart failure: Secondary | ICD-10-CM | POA: Diagnosis not present

## 2023-11-29 LAB — CBC
HCT: 35.2 % — ABNORMAL LOW (ref 39.0–52.0)
Hemoglobin: 11.5 g/dL — ABNORMAL LOW (ref 13.0–17.0)
MCH: 31.4 pg (ref 26.0–34.0)
MCHC: 32.7 g/dL (ref 30.0–36.0)
MCV: 96.2 fL (ref 80.0–100.0)
Platelets: 155 K/uL (ref 150–400)
RBC: 3.66 MIL/uL — ABNORMAL LOW (ref 4.22–5.81)
RDW: 12.6 % (ref 11.5–15.5)
WBC: 5.7 K/uL (ref 4.0–10.5)
nRBC: 0 % (ref 0.0–0.2)

## 2023-11-29 LAB — BASIC METABOLIC PANEL WITH GFR
Anion gap: 8 (ref 5–15)
BUN: 20 mg/dL (ref 8–23)
CO2: 27 mmol/L (ref 22–32)
Calcium: 8.7 mg/dL — ABNORMAL LOW (ref 8.9–10.3)
Chloride: 104 mmol/L (ref 98–111)
Creatinine, Ser: 1.12 mg/dL (ref 0.61–1.24)
GFR, Estimated: >60 mL/min (ref >60)
Glucose, Bld: 87 mg/dL (ref 70–99)
Potassium: 4 mmol/L (ref 3.5–5.1)
Sodium: 139 mmol/L (ref 135–145)

## 2023-12-01 DIAGNOSIS — R1310 Dysphagia, unspecified: Secondary | ICD-10-CM | POA: Diagnosis not present

## 2023-12-01 DIAGNOSIS — Z85810 Personal history of malignant neoplasm of tongue: Secondary | ICD-10-CM | POA: Diagnosis not present

## 2023-12-01 DIAGNOSIS — I6529 Occlusion and stenosis of unspecified carotid artery: Secondary | ICD-10-CM | POA: Diagnosis not present

## 2023-12-01 DIAGNOSIS — R221 Localized swelling, mass and lump, neck: Secondary | ICD-10-CM | POA: Diagnosis not present

## 2023-12-02 ENCOUNTER — Encounter: Payer: Self-pay | Admitting: General Surgery

## 2023-12-02 NOTE — Progress Notes (Addendum)
 Perioperative / Anesthesia Services  Pre-Admission Testing Clinical Review / Pre-Operative Anesthesia Consult  Date: 12/02/23  PATIENT DEMOGRAPHICS: Name: Cory Hess DOB: 1945-09-26 MRN:   [id number]  Note: Available PAT nursing documentation and vital signs have been reviewed. Clinical nursing staff has updated patient's PMH/PSHx, current medication list, and drug allergies/intolerances to ensure complete and comprehensive history available to assist care teams in MDM as it pertains to the aforementioned surgical procedure and anticipated anesthetic course. Extensive review of available clinical information personally performed. Nursing documentation reviewed. [place] PMH and PSHx updated with any diagnoses and/or procedures that I have knowledge of that may have been inadvertently omitted during his intake with the pre-admission testing department's nursing staff.  PLANNED SURGICAL PROCEDURE(S):   Case: [id number] Date/Time: 12/05/23 0715   Procedure: REPAIR, HERNIA, INGUINAL, ADULT (Left) - open procedure with mesh   Anesthesia type: General   Diagnosis: Non-recurrent unilateral inguinal hernia without obstruction or gangrene [K40.90]   Pre-op diagnosis: left inguinal hernia   Location: [facility] OR ROOM 07 / [facility]   Surgeons: Marinda Jayson KIDD, MD        CLINICAL DISCUSSION: Cory Hess is a 78 y.o. male who is submitted for pre-surgical anesthesia review and clearance prior to him undergoing the above procedure. Patient is a Former Smoker (quit 02/1998). Pertinent PMH includes: CAD (s/p CABG), STEMI, ischemic cardiomyopathy, HFrEF, PEA arrest, cardiogenic shock, CHB, alternating RBBB and LBBB, CVA, chronic cerebral microvascular disease, BILATERAL carotid artery stenosis (s/p RICA stent), cardiomegaly, pulmonary embolism, HTN, HLD, hypothyroidism, CKD-[name], GERD (on daily H2 blocker), cancer of base of tongue (remote), BILATERAL inguinal hernia,  thrombocytopenia, BPH, bladder tumor, nephrolithiasis, OA.  Patient is followed by cardiology (End, MD). He was last seen in the cardiology clinic on 09/23/2023; notes reviewed. At the time of his clinic visit, patient doing well overall from a cardiovascular perspective.  Patient with intermittent episodes of positional related vertigo off and on for months.  He had also stopped taking his H2 blocker for about a month and has noted some increased indigestion.  Patient denied any chest pain, shortness of breath, PND, orthopnea, palpitations, significant peripheral edema, weakness, fatigue, or presyncope/syncope. Patient with a past medical history significant for cardiovascular diagnoses. Documented physical exam was grossly benign, providing no evidence of acute exacerbation and/or decompensation of the patient's known cardiovascular conditions.  Patient suffered a STEMI on 08/27/2017. Patient with presumably knee LBBB. Diagnostic LEFT heart catheterization was performed revealing multivessel CAD: 80% o-dLM, 100% pLAD, 95% o-pLCx, 60% pRCA, 90% mRCA, and 80% oRPDA-RPDA. IABP was placed. Given the degree and complexity of patient's coronary artery disease, patient was transferred to [facility] for revascularization.   Patient underwent 3 vessel revascularization (CABG) on 08/27/2017. LIMA-LAD, SVG-OM, and SVG-PDA bypass grafts were placed.   Patient experienced a witnessed cardiac arrest while in the waiting area at [facility] on 02/1//2025. Bystanders immediately started CPR before care was assumed by staff. Patient reported to be asystolic initially. EMS was called and patient was transferred to [facility] for ongoing care and management. En route, patient experienced multiple episodes of PEA arrest requiring further resuscitative efforts including CPR and ACLS interventions. Similar episodes continued in the ED with short lived ROSC bring achieved. Patient with complete heart block and cardiogenic  shock. He stabilized with TCP allowing for him to be taken to the catheterization lab by Dr. Mady.   Diagnostic RIGHT/LEFT heart catheterization was performed revealing severely reduced left ventricular systolic function with  an EF of 25-30%. There was anterior hypokinesis/akinesis. Known severe native coronary artery disease was observed; 100% o-dLM, 100% pLAD, 70% dLAD, 100% o-pLCx, 60% pRCA, 95% mRCA, and 80% oRPDA-RPDA. LIMA-LAD and SVG-OM were noted to be widely patent. SVG-PDA occluded, which was felt to be chronic in nature. Hemodynamics: mean RA = 15 mmHg, mean PA = 30  mmHg, mean PCWP = 25  mmHg, Ao saturation = 80%, PA saturation = 51%, CO = 7.1 L/min, and CI = 3.1 L/min/m2. TVP was floated during catheterization allowing for discontinuation of TCP. No further interventions were performed at that point option or aggressive secondary prevention through medical management.   TTE performed on 04/07/2023 revealed a mildly reduced left ventricular systolic function with an EF of 40-45%. There was mild LVH.  There was anteroseptal, inferior/posterior and apical hypokinesis. Additionally, there was possible anterior wall hypokinesis. Left ventricular diastolic Doppler parameters consistent with abnormal relaxation (G1DD). Right ventricular size and function normal. Mild calcification of the aortic valve was observed. There was no significant valvular regurgitation.  All transvalvular gradients were noted to be normal providing no evidence of hemodynamically significant valvular stenosis. Aorta normal in size with no evidence of ectasia or aneurysmal dilatation.  MRI imaging of the brain was performed on 04/08/2023 revealing 2 small areas of acute infarction affecting the cortical brain in the RIGHT frontal region.   CTA imaging of the chest on 04/15/2023 revealed extensive clot burden with an occlusive clot in the RIGHT upper lobe anterior and posterior segmental arteries, and a non occlusive thrombus in  the RIGHT lower lobe main artery and superior and posterior basal segmental arteries. Patient required pulmonary thrombectomy procedure on 04/18/2023.   Follow up limited TTE on 04/16/2023 showed improvement of patient's ischemic cardiomyopathy. There was normalization of patient's EF to 55-60%.  Patient with known carotid disease. He underwent with stenting of the RICA on 10/26/2023.  Ischemic cardiomyopathy and resulting HFrEF being managed with beta-blocker (carvedilol ) and ARB (losartan ) therapies.  Blood pressure well-controlled at 120/69 mmHg on aforementioned regimen. Patient is on atorvastatin  for his HLD diagnosis and ASCVD prevention. Patient is not diabetic. He does not have an OSAH diagnosis. Patient is able to complete all of his  ADL/IADLs without cardiovascular limitation.  Per the DASI, patient is able to achieve at least 4 METS of physical activity without experiencing any significant degree of angina/anginal equivalent symptoms. No changes were made to his medication regimen during his visit with cardiology.  Patient scheduled to follow-up with outpatient cardiology in 3 months or sooner if needed.  Cory Hess is scheduled for an elective REPAIR, HERNIA, INGUINAL, ADULT (Left) on 12/05/2023 with Dr. Jayson MALVA Endow, MD. Given patient's past medical history significant for cardiovascular diagnoses, presurgical cardiac clearance was sought by the PAT team. Per cardiology, considering his prior cardiac history, RCRI calculates to 11% risk of MACE. Based ACC/AHA guidelines, the patient's past medical history, and the amount of time since his last clinic visit, this patient would be at an overall HIGH/ACCEPTABLE risk for the planned procedure without further cardiovascular testing or intervention at this time.  Again, this patient is on daily oral anticoagulation therapy using a DOAC medication.  He has been instructed on recommendations for holding his apixaban  for 3 days prior to his  procedure with plans to restart as soon as postoperative bleeding risk felt to be minimized by his primary attending surgeon. The patient has been instructed that his last dose should be on 12/01/2023. Given that patient's  past medical history is significant for cardiovascular diagnoses, including but not limited to CAD, general surgery has cleared patient to continue his daily low dose ASA throughout his perioperative course. He will be asked to hold his normal dose on the day of his procedure only. Patient has been updated on these directives from his specialty care providers by the PAT team.  Patient denies previous perioperative complications with anesthesia in the past. In review his EMR, it is noted that patient underwent a general anesthetic course here at [facility] (ASA [name]) in 12/2018 without documented complications.   MOST RECENT VITAL SIGNS:    10/26/2023   10:30 AM 10/26/2023   10:15 AM 10/26/2023   10:00 AM  Vitals with BMI  Systolic 148 141 858  Diastolic 82 70 77  Pulse 83 84 87   PROVIDERS/SPECIALISTS: NOTE: Primary physician provider listed below. Patient may have been seen by APP or partner within same practice.   PROVIDER ROLE / SPECIALTY LAST OV  Marinda Jayson KIDD, MD General Surgery (Surgeon) 10/25/2023  Towana Small, FNP Primary Care Provider 09/13/2023  End, Lonni, MD Cardiology 09/23/2023  Marea Mayo, MD Vascular Surgery 10/18/2023  Rennie Lighter, MD Medical Oncology 08/23/2023   ALLERGIES: No Known Allergies  CURRENT HOME MEDICATIONS: No current facility-administered medications for this encounter.    acetaminophen  (TYLENOL ) 500 MG tablet   apixaban  (ELIQUIS ) 5 MG TABS tablet   aspirin  81 MG chewable tablet   atorvastatin  (LIPITOR ) 80 MG tablet   carvedilol  (COREG ) 6.25 MG tablet   famotidine  (PEPCID ) 20 MG tablet   levothyroxine  (SYNTHROID ) 100 MCG tablet   losartan  (COZAAR ) 25 MG tablet   Multiple Vitamin  (MULTIVITAMIN WITH MINERALS) TABS tablet   Blood Pressure Monitoring (BLOOD PRESSURE KIT) KIT    Heparin  (Porcine) in NaCl 1000-0.9 UT/500ML-% SOLN   Heparin  (Porcine) in NaCl 2000-0.9 UNIT/L-% SOLN   iodixanol  (VISIPAQUE ) 320 MG/ML injection   lidocaine -EPINEPHrine  (PF) (XYLOCAINE -EPINEPHrine ) 1 %-1:200000 (PF) injection   HISTORY: Past Medical History:  Diagnosis Date   Abscess of right arm 03/2023   pt states had absess to right arm after iv was inserted. Pt states he had a wound vac for 2 weeks   Alternating RBBB & LBBB    Ankle swelling 2024   Anticoagulated on apixaban     Aortic atherosclerosis    Arthritis of neck    Bilateral inguinal hernia    Bladder tumor    BPH (benign prostatic hyperplasia)    CAD (coronary artery disease)    a. 08/2017 STEMI/Cath: LM 80ost/d, LAD 100p, LCX 95 ost/p, RCA 60p, 25m, RPDA 80ost; b. 08/2017 CABG x 3: LIMA->LAD, VG->OM, VG->PDA; c. 03/2023 Cardiac Arrest/Cath: Occluded VG->RPDA, patent LIMA->LAD and VG->pLCX. No new native dzs->Med rx. EF 25-35%.   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   CVA (cerebral vascular accident) (HCC) 04/08/2023   a.) brain MRI 04/08/2023: 2 small acute infarctions affecting the cortical brain in the RIGHT frontal region.  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 08/2017 TEE: EF 30-35%, sept/ant/inf HK, apical AK. 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  12/2017 Echo: EF 45-50%, diff HK; c. 09/2022 Echo: EF 55-60%; d. 03/2023 Echo (in settng of PE): EF 40-45%, GrI DD (imaging poor); e. 03/2023 Echo w/ definity : EF 55-60%, nl RV fxn.   Hyperlipidemia LDL goal <70    Hypothyroidism    Ischemic cardiomyopathy    a. 08/2017 TEE: EF 30-35%; b. 12/2017 Echo: EF 45-50%; c. 09/2022 Echo: EF 55-60%.   Long-term use of aspirin  therapy    Nephrolithiasis    PEA (Pulseless electrical activity) (HCC) 04/05/2023   a.) witnessed arrest at [facility] and CPR initiated immediately --> ROSC achieved --> EMS transferred to [facility] with multiple episodes of PEA arrest en route (further CPR required) --> ROSC achieved in ED with further muliple epsides of recurrent PEA arrest lasting secs-mins --> TCP applied and patient taken to cath lab for [facility] and TVP placement   Port-A-Cath in place    Pulmonary embolism (HCC) 04/15/2023   a.) CTA chest 04/15/2023: extensive clot burden with an occlusive clot in the RIGHT upper lobe anterior and posterior segmental arteries, and a non occlusive thrombus in the RIGHT lower lobe main artery and superior and posterior basal segmental arteries --> required thrombectomy   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) LIMA-LAD, SVG-OM, SVG-PDA   Shingles 2018   Status post bilateral cataract extraction    STEMI (ST elevation myocardial infarction) (HCC) 08/27/2017   a.) LHC 08/27/2017: 80% o-dLM, 100% lPAD, 95% o-pLCx, 60% pRCA, 90% mRCA, 80% oRPDA-RPDA --> unable to cross lesions with wire --> IABP placed --> CVTS consulted and patient transferred to [facility]; b.) s/p 3v CABG 08/27/2017: LIMA-LAD, SVG-OM, SVG-RPDA   Thrombocytopenia    Vertigo    Past Surgical History:  Procedure Laterality Date   CARDIAC CATHETERIZATION     CAROTID PTA/STENT INTERVENTION Right 10/26/2023   Procedure: CAROTID PTA/STENT INTERVENTION;  Surgeon: Marea Selinda RAMAN, MD;  Location: [facility] INVASIVE CV LAB;  Service: Cardiovascular;  Laterality: Right;   CATARACT EXTRACTION W/PHACO  Left 07/18/2018   Procedure: CATARACT EXTRACTION PHACO AND INTRAOCULAR LENS PLACEMENT (IOC)  LEFT;  Surgeon: Myrna Adine Anes, MD;  Location: [facility];  Service: Ophthalmology;  Laterality: Left;   CATARACT EXTRACTION W/PHACO Right 08/14/2018   Procedure: CATARACT EXTRACTION PHACO AND INTRAOCULAR LENS PLACEMENT (IOC)  RIGHT;  Surgeon: Myrna Adine Anes, MD;  Location: [facility];  Service: Ophthalmology;  Laterality: Right;   CORONARY ARTERY BYPASS GRAFT N/A 08/27/2017   Procedure: CORONARY ARTERY BYPASS GRAFTING (CABG) ON PUMP USING LEFT INTERNAL MAMMARY ARTERY AND LEFT GREATER SAPHENOUS VEIN VIA ENDOVEIN HARVEST;  Surgeon: Lucas Dorise POUR, MD;  Location: [facility] OR;  Service: Open Heart Surgery;  Laterality: N/A;   CORONARY/GRAFT ACUTE MI REVASCULARIZATION N/A 08/27/2017   Procedure: Coronary/Graft Acute MI Revascularization;  Surgeon: Darron Deatrice LABOR, MD;  Location: [facility] INVASIVE CV LAB;  Service: Cardiovascular;  Laterality: N/A;   CYSTOSCOPY W/ RETROGRADES Bilateral 12/25/2018   Procedure: CYSTOSCOPY WITH RETROGRADE PYELOGRAM;  Surgeon: Penne Knee, MD;  Location: [facility] ORS;  Service: Urology;  Laterality: Bilateral;   FRACTURE SURGERY Right 1958   arm and left wrist compound fracture, no metal   HERNIA REPAIR Right    inguinial   IR IMAGING  GUIDED PORT INSERTION  10/15/2022   LEFT HEART CATH AND CORONARY ANGIOGRAPHY N/A 08/27/2017   Procedure: LEFT HEART CATH AND CORONARY ANGIOGRAPHY;  Surgeon: Darron Deatrice LABOR, MD;  Location: [facility] INVASIVE CV LAB;  Service: Cardiovascular;  Laterality: N/A;   PULMONARY THROMBECTOMY Bilateral 04/18/2023   Procedure: PULMONARY THROMBECTOMY;  Surgeon: Marea Selinda RAMAN, MD;  Location: [facility] INVASIVE CV LAB;  Service: Cardiovascular;  Laterality: Bilateral;   RIGHT/LEFT HEART CATH AND CORONARY/GRAFT ANGIOGRAPHY N/A 04/05/2023   Procedure: RIGHT/LEFT HEART CATH AND CORONARY/GRAFT ANGIOGRAPHY;  Surgeon: Mady Bruckner, MD;  Location: [facility]  INVASIVE CV LAB;  Service: Cardiovascular;  Laterality: N/A;   TEMPORARY PACEMAKER N/A 04/05/2023   Procedure: TEMPORARY PACEMAKER;  Surgeon: Mady Bruckner, MD;  Location: [facility] INVASIVE CV LAB;  Service: Cardiovascular;  Laterality: N/A;   TRANSURETHRAL RESECTION OF BLADDER TUMOR WITH MITOMYCIN -C N/A 12/25/2018   Procedure: TRANSURETHRAL RESECTION OF BLADDER TUMOR WITH Gemcitabine ;  Surgeon: Penne Knee, MD;  Location: [facility] ORS;  Service: Urology;  Laterality: N/A;   Family History  Problem Relation Age of Onset   Heart failure Mother    Lung disease Father    Stroke Father    Cervical cancer Maternal Grandmother    Social History   Tobacco Use   Smoking status: Former    Current packs/day: 0.00    Types: Cigarettes    Quit date: 2000    Years since quitting: 25.7    Passive exposure: Past   Smokeless tobacco: Former    Types: Snuff  Substance Use Topics   Alcohol use: Not Currently   LABS:  Hospital Outpatient Visit on 11/29/2023  Component Date Value Ref Range Status   WBC 11/29/2023 5.7  4.0 - 10.5 K/uL Final   RBC 11/29/2023 3.66 (L)  4.22 - 5.81 MIL/uL Final   Hemoglobin 11/29/2023 11.5 (L)  13.0 - 17.0 g/dL Final   HCT 89/92/7974 35.2 (L)  39.0 - 52.0 % Final   MCV 11/29/2023 96.2  80.0 - 100.0 fL Final   MCH 11/29/2023 31.4  26.0 - 34.0 pg Final   MCHC 11/29/2023 32.7  30.0 - 36.0 g/dL Final   RDW 89/92/7974 12.6  11.5 - 15.5 % Final   Platelets 11/29/2023 155  150 - 400 K/uL Final   nRBC 11/29/2023 0.0  0.0 - 0.2 % Final   Performed at [facility], [address] [place], [place] [address]   Sodium 11/29/2023 139  135 - 145 mmol/L Final   Potassium 11/29/2023 4.0  3.5 - 5.1 mmol/L Final   Chloride 11/29/2023 104  98 - 111 mmol/L Final   CO2 11/29/2023 27  22 - 32 mmol/L Final   Glucose, Bld 11/29/2023 87  70 - 99 mg/dL Final   Glucose reference range applies only to samples taken after fasting for at least 8 hours.   BUN 11/29/2023 20  8 - 23  mg/dL Final   Creatinine, Ser 11/29/2023 1.12  0.61 - 1.24 mg/dL Final   Calcium  11/29/2023 8.7 (L)  8.9 - 10.3 mg/dL Final   GFR, Estimated 11/29/2023 >60  >60 mL/min Final   Comment: (NOTE) Calculated using the CKD-EPI Creatinine Equation (2021)    Anion gap 11/29/2023 8  5 - 15 Final   Performed at [facility], [address] [place], [place] [address]    ECG: Date: 09/23/2023  Time ECG obtained: 1336 PM Rate: 61 bpm Rhythm: Sinus rhythm with first-degree AV block; RBBB Axis (leads I and aVF): left Intervals: PR 224  ms. QRS 172 ms. QTc 459 ms. ST segment and T wave changes: No evidence of acute T wave abnormalities or significant ST segment elevation or depression.  Evidence of a possible, age undetermined, prior infarct:  No Comparison: Similar to previous tracing obtained on 08/17/2023   IMAGING / PROCEDURES: CT SOFT TISSUE NECK W CONTRAST performed on 09/02/2023 Previously treated mass of the left tongue base and tonsil is barely detectable as a slight area of architectural distortion and slight increased density within the fatty atrophy tongue. I believe this is consistent with stable post treatment appearance and do not see evidence of worsening disease or definite disease recurrence. No new or worsening lymphadenopathy. Scarring related to treated left level 2 adenopathy. Advanced atherosclerotic disease at the carotid bifurcations with near occlusion of the right carotid artery. Lesser disease at the left carotid bifurcation.  MR PELVIS WO CONTRAST performed on 08/03/2023 Small-to-moderate left and small right fat containing inguinal hernias. No herniation of bowel loops. No femoral hernia seen on either side. Prostatomegaly.  Correlate with serum PSA levels.  US  SOFT TISSUE HEAD/NECK performed on 08/03/2023 Diffuse soft tissue edema of the anterior neck of uncertain etiology.  No organized abscess identified.   CT ANGIO CHEST PE, ABD, PELVIS W/CM &/OR WO CM  performed on 07/17/2023 No evidence of pulmonary embolus. No acute intrathoracic process. Aortic atherosclerosis  Coronary artery atherosclerosis. Stable nonobstructing 3 mm left renal calculus. Postsurgical changes from prior left upper quadrant percutaneous gastrostomy tube, with no evidence of complication or abscess. Aortic atherosclerosis   ECHOCARDIOGRAM LIMITED performed on 04/16/2023 Left ventricular ejection fraction, by estimation, is 55 to 60%. Left  ventricular ejection fraction by 2D MOD biplane is 56.9 %. The left  ventricle has normal function. The left ventricle has no regional wall  motion abnormalities. There is mild left  ventricular hypertrophy.  Right ventricular systolic function is normal. The right ventricular size is normal.   CT HEAD WO CONTRAST performed on 04/15/2023 Small right frontal lobe cortical infarcts this month on MRI are occult by CT.  No new intracranial abnormality identified.  US  CAROTID BILATERAL performed on 04/14/2023 Bilateral carotid bifurcation plaque resulting in less than 50% diameter ICA stenosis. Antegrade bilateral vertebral arterial flow. Stable right IJ thrombus.  MR BRAIN WO CONTRAST performed on 04/08/2023 2 small acute infarctions affecting the cortical brain in the right frontal region. Mild chronic small-vessel ischemic change of the cerebral hemispheric white matter with a few old small cortical infarctions scattered about the right hemisphere suggesting chronic/recurrent micro embolic infarctions. Recommend carotid evaluation.   TRANSTHORACIC ECHOCARDIOGRAM performed on 04/07/2023 Very challenging images, definity  not used  Left ventricular ejection fraction, by estimation, is 40 to 45 %. The left ventricle has mildly decreased function. The left ventricle demonstrates regional wall motion abnormalities (images suggest anteroseptal, inferior/posterior and apical hypokinesis, possibly anterior wall hypokinesis). There is mild  left ventricular hypertrophy. Left ventricular diastolic parameters are consistent with Grade I diastolic dysfunction (impaired relaxation).  Right ventricular systolic function was not well visualized. The right ventricular size is normal.  The mitral valve is normal in structure. No evidence of mitral valve regurgitation. No evidence of mitral stenosis.  The aortic valve is normal in structure. There is mild calcification of the aortic valve. Aortic valve regurgitation is not visualized. Aortic valve sclerosis/calcification is present, without any evidence of aortic stenosis. Aortic valve mean gradient measures 2.0 mmHg.  RIGHT/LEFT HEART CATHETERIZATION AND CORONARY ANGIOGRAPHY performed on 04/05/2023 Severe native coronary artery disease with occluded  LMCA and diffusely diseased RCA with up to 95% stenosis. Widely patent LIMA-LAD and SVG-LCx. Occluded SVG-PDA, likely chronic. Severely reduced left ventricular systolic function (LVEF 25-30%) with anterior hypokinesis/akinesis. Mildly to moderately elevated left heart filling pressures (LVEDP 20 mmHg; PCWP 25 mmHg). Moderately to severely elevated right heart filling pressures (mean RA 15 mmHg, RV 40/15 mmHg).  PAPi of 1 suggests an element of RV dysfunction. Successful placement of temporary transvenous pacemaker via the right femoral vein. Hemodynamics RA (mean): 15 mmHg RV (S/EDP): 40/15 mmHg PA (S/D, mean): 40/25 (30) mmHg PCWP (mean): 25 mmHg Ao sat: 80% PA sat: 51% Fick CO: 7.1 L/min Fick CI: 3.1 L/min/m^2 Thermodilution CO: 5.4 L/min Thermodilution CI: 2.3 L/min/m^2 PAPi: 1  Recommendations: No obvious culprit lesion identified for the patient's cardiac arrest, marked bradycardia/complete heart block, cardiogenic shock, and acute on chronic HFrEF.  Continue secondary prevention of CAD. Significant hypoxia noted throughout catheterization despite aggressive oxygenation with the ventilator.  Question if some other process such as  pulmonary embolism or intrinsic lung disease could be contributing.  Recommend empiric heparin  to be given 2 hours after right femoral artery hemostasis was achieved.  Further workup per critical care team. Obtain stat portable chest x-ray when patient arrives in the ICU to confirm location of temporary transvenous pacing wire and pulmonary artery catheter. Continue gentle diuresis as blood pressure allows. Wean epinephrine  infusion.     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 A right-sided diminutive level 2 node is mildly hypermetabolic and suspicious for contralateral nodal metastasis. No extracervical metastatic disease identified. Hypermetabolic focus within the prostate in the setting of prostatomegaly. Nonspecific. Consider correlation with PSA level. Cholelithiasis.  Left nephrolithiasis.  Aortic atherosclerosis  IMPRESSION AND PLAN: Cory Hess has been referred for pre-anesthesia review and clearance prior to him undergoing the planned anesthetic and procedural courses. Available labs, pertinent testing, and imaging results were personally reviewed by me in preparation for upcoming operative/procedural course. [facility] medical record has been updated following extensive record review and patient interview with PAT staff.   This patient has been appropriately cleared by cardiology with an overall HIGH/ACCEPTABLE risk of patient experiencing significant perioperative cardiovascular complications. Based on clinical review performed today (12/02/23), barring any significant acute changes in the patient's overall condition, it is anticipated that he will be able to proceed with the planned surgical intervention. Any acute changes in clinical condition may necessitate his procedure being postponed and/or cancelled. Patient will meet with anesthesia team (MD and/or CRNA) on the day of his  procedure for preoperative evaluation/assessment. Questions regarding anesthetic course will be fielded at that time.   Pre-surgical instructions were reviewed with the patient during his PAT appointment, and questions were fielded to satisfaction by PAT clinical staff. He has been instructed on which medications that he will need to hold prior to surgery, as well as the ones that have been deemed safe/appropriate to take on the day of his procedure. As part of the general education provided by PAT, patient made aware both verbally and in writing, that he would need to abstain from the use of any illegal substances during his perioperative course. He was advised that failure to follow the provided instructions could necessitate case cancellation or result in serious perioperative complications up to and including death. Patient encouraged to contact PAT and/or his surgeon's office to discuss any questions or concerns that may arise prior to surgery;  verbalized understanding.   Dorise Pereyra, MSN, APRN, FNP-C, CEN [facility]  Perioperative Services Nurse Practitioner Phone: [phone] Fax: [phone] 12/02/23 5:16 PM  NOTE: This note has been prepared using Dragon dictation software. Despite my best ability to proofread, there is always the potential that unintentional transcriptional errors may still occur from this process.

## 2023-12-04 ENCOUNTER — Encounter: Payer: Self-pay | Admitting: Urgent Care

## 2023-12-05 ENCOUNTER — Ambulatory Visit
Admission: RE | Admit: 2023-12-05 | Discharge: 2023-12-05 | Disposition: A | Discharge status: Home / Self Care | Attending: General Surgery | Admitting: General Surgery

## 2023-12-05 ENCOUNTER — Other Ambulatory Visit: Payer: Self-pay

## 2023-12-05 ENCOUNTER — Ambulatory Visit: Payer: Self-pay | Admitting: Urgent Care

## 2023-12-05 ENCOUNTER — Encounter: Admission: RE | Disposition: A | Payer: Self-pay | Source: Home / Self Care | Attending: General Surgery

## 2023-12-05 ENCOUNTER — Encounter: Payer: Self-pay | Admitting: General Surgery

## 2023-12-05 DIAGNOSIS — I2510 Atherosclerotic heart disease of native coronary artery without angina pectoris: Secondary | ICD-10-CM | POA: Insufficient documentation

## 2023-12-05 DIAGNOSIS — Z951 Presence of aortocoronary bypass graft: Secondary | ICD-10-CM | POA: Diagnosis not present

## 2023-12-05 DIAGNOSIS — M1990 Unspecified osteoarthritis, unspecified site: Secondary | ICD-10-CM | POA: Diagnosis not present

## 2023-12-05 DIAGNOSIS — I5042 Chronic combined systolic (congestive) and diastolic (congestive) heart failure: Secondary | ICD-10-CM | POA: Diagnosis not present

## 2023-12-05 DIAGNOSIS — Z87891 Personal history of nicotine dependence: Secondary | ICD-10-CM | POA: Diagnosis not present

## 2023-12-05 DIAGNOSIS — I252 Old myocardial infarction: Secondary | ICD-10-CM | POA: Diagnosis not present

## 2023-12-05 DIAGNOSIS — N183 Chronic kidney disease, stage 3 unspecified: Secondary | ICD-10-CM | POA: Insufficient documentation

## 2023-12-05 DIAGNOSIS — I130 Hypertensive heart and chronic kidney disease with heart failure and stage 1 through stage 4 chronic kidney disease, or unspecified chronic kidney disease: Secondary | ICD-10-CM | POA: Insufficient documentation

## 2023-12-05 DIAGNOSIS — K4090 Unilateral inguinal hernia, without obstruction or gangrene, not specified as recurrent: Secondary | ICD-10-CM | POA: Diagnosis not present

## 2023-12-05 DIAGNOSIS — I10 Essential (primary) hypertension: Secondary | ICD-10-CM | POA: Diagnosis not present

## 2023-12-05 DIAGNOSIS — N400 Enlarged prostate without lower urinary tract symptoms: Secondary | ICD-10-CM | POA: Insufficient documentation

## 2023-12-05 DIAGNOSIS — Z8673 Personal history of transient ischemic attack (TIA), and cerebral infarction without residual deficits: Secondary | ICD-10-CM | POA: Diagnosis not present

## 2023-12-05 DIAGNOSIS — K219 Gastro-esophageal reflux disease without esophagitis: Secondary | ICD-10-CM | POA: Diagnosis not present

## 2023-12-05 DIAGNOSIS — I639 Cerebral infarction, unspecified: Secondary | ICD-10-CM | POA: Diagnosis not present

## 2023-12-05 DIAGNOSIS — E039 Hypothyroidism, unspecified: Secondary | ICD-10-CM | POA: Insufficient documentation

## 2023-12-05 DIAGNOSIS — E785 Hyperlipidemia, unspecified: Secondary | ICD-10-CM | POA: Insufficient documentation

## 2023-12-05 HISTORY — DX: Cataract extraction status, left eye: Z98.42

## 2023-12-05 HISTORY — DX: Calculus of kidney: N20.0

## 2023-12-05 HISTORY — DX: Other cerebrovascular disease: I67.89

## 2023-12-05 HISTORY — DX: Long term (current) use of aspirin: Z79.82

## 2023-12-05 HISTORY — DX: Calculus of gallbladder without cholecystitis without obstruction: K80.20

## 2023-12-05 HISTORY — DX: Cataract extraction status, right eye: Z98.41

## 2023-12-05 HISTORY — DX: Atherosclerosis of aorta: I70.0

## 2023-12-05 HISTORY — DX: Bilateral inguinal hernia, without obstruction or gangrene, not specified as recurrent: K40.20

## 2023-12-05 HISTORY — DX: Chronic kidney disease, stage 3 unspecified: N18.30

## 2023-12-05 HISTORY — DX: Cardiomegaly: I51.7

## 2023-12-05 HISTORY — DX: Spondylosis without myelopathy or radiculopathy, cervical region: M47.812

## 2023-12-05 HISTORY — DX: Fatty (change of) liver, not elsewhere classified: K76.0

## 2023-12-05 HISTORY — DX: Benign prostatic hyperplasia without lower urinary tract symptoms: N40.0

## 2023-12-05 SURGERY — REPAIR, HERNIA, INGUINAL, ADULT
Anesthesia: General | Laterality: Left

## 2023-12-05 MED ORDER — CHLORHEXIDINE GLUCONATE CLOTH 2 % EX PADS: 6.0000 | MEDICATED_PAD | Freq: Once | CUTANEOUS | Status: DC

## 2023-12-05 MED ORDER — SUGAMMADEX SODIUM 200 MG/2ML IV SOLN
INTRAVENOUS | Status: DC | PRN
  Administered 2023-12-05: 180 mg via INTRAVENOUS

## 2023-12-05 MED ORDER — PHENYLEPHRINE 80 MCG/ML (10ML) SYRINGE FOR IV PUSH (FOR BLOOD PRESSURE SUPPORT)
PREFILLED_SYRINGE | INTRAVENOUS | Status: DC | PRN
Start: 2023-12-05 — End: 2023-12-05
  Administered 2023-12-05: 80 ug via INTRAVENOUS
  Administered 2023-12-05: 160 ug via INTRAVENOUS
  Administered 2023-12-05 (×4): 80 ug via INTRAVENOUS

## 2023-12-05 MED ORDER — PROPOFOL 10 MG/ML IV BOLUS
INTRAVENOUS | Status: AC
  Filled 2023-12-05: qty 20

## 2023-12-05 MED ORDER — DROPERIDOL 2.5 MG/ML IJ SOLN: 0.6250 mg | Freq: Once | INTRAMUSCULAR | Status: DC | PRN

## 2023-12-05 MED ORDER — LIDOCAINE HCL (CARDIAC) PF 100 MG/5ML IV SOSY
PREFILLED_SYRINGE | INTRAVENOUS | Status: DC | PRN
  Administered 2023-12-05: 100 mg via INTRAVENOUS

## 2023-12-05 MED ORDER — ACETAMINOPHEN 10 MG/ML IV SOLN
INTRAVENOUS | Status: AC
  Filled 2023-12-05: qty 100

## 2023-12-05 MED ORDER — DEXAMETHASONE SOD PHOSPHATE PF 10 MG/ML IJ SOLN
INTRAMUSCULAR | Status: DC | PRN
  Administered 2023-12-05: 10 mg via INTRAVENOUS

## 2023-12-05 MED ORDER — ORAL CARE MOUTH RINSE: 15.0000 mL | Freq: Once | OROMUCOSAL | Status: DC

## 2023-12-05 MED ORDER — PROPOFOL 10 MG/ML IV BOLUS
INTRAVENOUS | Status: DC | PRN
  Administered 2023-12-05: 90 mg via INTRAVENOUS
  Administered 2023-12-05: 50 mg via INTRAVENOUS

## 2023-12-05 MED ORDER — ONDANSETRON HCL 4 MG/2ML IJ SOLN
INTRAMUSCULAR | Status: DC | PRN
  Administered 2023-12-05: 4 mg via INTRAVENOUS

## 2023-12-05 MED ORDER — OXYCODONE HCL 5 MG PO TABS
5.0000 mg | ORAL_TABLET | Freq: Once | ORAL | Status: AC
  Administered 2023-12-05: 5 mg via ORAL

## 2023-12-05 MED ORDER — PHENYLEPHRINE 80 MCG/ML (10ML) SYRINGE FOR IV PUSH (FOR BLOOD PRESSURE SUPPORT)
PREFILLED_SYRINGE | INTRAVENOUS | Status: AC
  Filled 2023-12-05: qty 10

## 2023-12-05 MED ORDER — BUPIVACAINE-EPINEPHRINE (PF) 0.5% -1:200000 IJ SOLN
INTRAMUSCULAR | Status: DC | PRN
  Administered 2023-12-05: 30 mL

## 2023-12-05 MED ORDER — ONDANSETRON HCL 4 MG/2ML IJ SOLN
INTRAMUSCULAR | Status: AC
  Filled 2023-12-05: qty 2

## 2023-12-05 MED ORDER — APIXABAN 5 MG PO TABS: 5.0000 mg | ORAL_TABLET | Freq: Two times a day (BID) | ORAL | Status: AC

## 2023-12-05 MED ORDER — ASPIRIN 81 MG PO CHEW: 81.0000 mg | CHEWABLE_TABLET | Freq: Every day | ORAL | Status: AC

## 2023-12-05 MED ORDER — CHLORHEXIDINE GLUCONATE 0.12 % MT SOLN: 15.0000 mL | Freq: Once | OROMUCOSAL | Status: DC

## 2023-12-05 MED ORDER — OXYCODONE HCL 5 MG PO TABS
ORAL_TABLET | ORAL | Status: AC
  Filled 2023-12-05: qty 1

## 2023-12-05 MED ORDER — FENTANYL CITRATE (PF) 100 MCG/2ML IJ SOLN: 25.0000 ug | INTRAMUSCULAR | Status: DC | PRN

## 2023-12-05 MED ORDER — CEFAZOLIN SODIUM-DEXTROSE 2-4 GM/100ML-% IV SOLN
2.0000 g | INTRAVENOUS | Status: AC
  Administered 2023-12-05: 2 g via INTRAVENOUS

## 2023-12-05 MED ORDER — FENTANYL CITRATE (PF) 100 MCG/2ML IJ SOLN
INTRAMUSCULAR | Status: DC | PRN
  Administered 2023-12-05 (×2): 50 ug via INTRAVENOUS

## 2023-12-05 MED ORDER — CHLORHEXIDINE GLUCONATE 0.12 % MT SOLN
OROMUCOSAL | Status: AC
Start: 2023-12-05 — End: 2023-12-05
  Filled 2023-12-05: qty 15

## 2023-12-05 MED ORDER — BUPIVACAINE-EPINEPHRINE (PF) 0.5% -1:200000 IJ SOLN
INTRAMUSCULAR | Status: AC
  Filled 2023-12-05: qty 30

## 2023-12-05 MED ORDER — FENTANYL CITRATE (PF) 100 MCG/2ML IJ SOLN
INTRAMUSCULAR | Status: AC
  Filled 2023-12-05: qty 2

## 2023-12-05 MED ORDER — ROCURONIUM BROMIDE 10 MG/ML (PF) SYRINGE
PREFILLED_SYRINGE | INTRAVENOUS | Status: DC | PRN
  Administered 2023-12-05: 60 mg via INTRAVENOUS

## 2023-12-05 MED ORDER — CEFAZOLIN SODIUM-DEXTROSE 2-4 GM/100ML-% IV SOLN
INTRAVENOUS | Status: AC
  Filled 2023-12-05: qty 100

## 2023-12-05 MED ORDER — ROCURONIUM BROMIDE 10 MG/ML (PF) SYRINGE
PREFILLED_SYRINGE | INTRAVENOUS | Status: AC
  Filled 2023-12-05: qty 10

## 2023-12-05 MED ORDER — LACTATED RINGERS IV SOLN: INTRAVENOUS | Status: DC

## 2023-12-05 MED ORDER — OXYCODONE HCL 5 MG PO TABS: 5.0000 mg | ORAL_TABLET | Freq: Four times a day (QID) | ORAL | 0 refills | Status: DC | PRN

## 2023-12-05 MED ORDER — ACETAMINOPHEN 10 MG/ML IV SOLN
INTRAVENOUS | Status: DC | PRN
  Administered 2023-12-05: 1000 mg via INTRAVENOUS

## 2023-12-05 MED ORDER — LIDOCAINE HCL (PF) 2 % IJ SOLN
INTRAMUSCULAR | Status: AC
  Filled 2023-12-05: qty 5

## 2023-12-05 SURGICAL SUPPLY — 27 items
BLADE CLIPPER SURG (BLADE) ×2 IMPLANT
BLADE SURG 15 STRL LF DISP TIS (BLADE) ×2 IMPLANT
BRUSH SCRUB EZ 4% CHG (MISCELLANEOUS) ×2 IMPLANT
CHLORAPREP W/TINT 26 (MISCELLANEOUS) IMPLANT
DERMABOND ADVANCED .7 DNX12 (GAUZE/BANDAGES/DRESSINGS) ×2 IMPLANT
DRAIN PENROSE 12X.25 LTX STRL (MISCELLANEOUS) ×2 IMPLANT
DRAPE LAPAROTOMY 100X77 ABD (DRAPES) ×2 IMPLANT
ELECTRODE REM PT RTRN 9FT ADLT (ELECTROSURGICAL) ×2 IMPLANT
GAUZE 4X4 16PLY ~~LOC~~+RFID DBL (SPONGE) IMPLANT
GLOVE BIOGEL PI IND STRL 7.5 (GLOVE) ×2 IMPLANT
GLOVE SURG SYN 7.0 PF PI (GLOVE) ×2 IMPLANT
GOWN STRL REUS W/ TWL LRG LVL3 (GOWN DISPOSABLE) ×4 IMPLANT
LABEL OR SOLS (LABEL) ×2 IMPLANT
MANIFOLD NEPTUNE II (INSTRUMENTS) ×2 IMPLANT
MESH HERNIA 6X13 (Mesh General) IMPLANT
NDL HYPO 22X1.5 SAFETY MO (MISCELLANEOUS) ×2 IMPLANT
NEEDLE HYPO 22X1.5 SAFETY MO (MISCELLANEOUS) ×2 IMPLANT
NS IRRIG 500ML POUR BTL (IV SOLUTION) ×2 IMPLANT
PACK BASIN MINOR ARMC (MISCELLANEOUS) ×2 IMPLANT
SUT PROLENE 2 0 SH DA (SUTURE) ×6 IMPLANT
SUT SILK 3-0 18XBRD TIE 12 (SUTURE) ×2 IMPLANT
SUT VIC AB 2-0 SH 27XBRD (SUTURE) ×2 IMPLANT
SUT VIC AB 3-0 SH 27X BRD (SUTURE) ×4 IMPLANT
SUTURE MNCRL 4-0 27XMF (SUTURE) ×2 IMPLANT
SYR 10ML LL (SYRINGE) ×2 IMPLANT
TRAP FLUID SMOKE EVACUATOR (MISCELLANEOUS) ×2 IMPLANT
WATER STERILE IRR 500ML POUR (IV SOLUTION) ×2 IMPLANT

## 2023-12-05 NOTE — H&P (Signed)
 NO changes to H and P, patient has been off eliquis  for over three days. Proceed with LEFT inguinal hernia repair with mesh, open      Cory Hess is an 78 y.o. male.       Chief Complaint  Patient presents with   Follow-up      HPI: The patient returns today in follow-up of left inguinal hernia.  He reports he continues to have pain in his left inguinal hernia but it is reducible.  He says it does bulge out when he stands up but will spontaneously pushes it and he lays down.  He has significant cardiac history and has undergone cardiac restratification and there is no further studies or procedures needed to optimize him for surgery.  He has also had a history of PE and has completed Eliquis  for 6 months and it is okay to hold for 3 days prior to the procedure.  He is actually scheduled to undergo carotid angiogram with Dr. Marea his vascular surgeon tomorrow and potential carotid stenting.       Past Medical History:  Diagnosis Date   Alternating RBBB & LBBB     Ankle swelling 2024   Arthritis      neck   Bladder tumor     CAD (coronary artery disease)      a. 08/2017 STEMI/Cath: LM 80ost/d, LAD 100p, LCX 95 ost/p, RCA 60p, 57m, RPDA 80ost; b. 08/2017 CABG x 3: LIMA->LAD, VG->OM, VG->PDA; c. 03/2023 Cardiac Arrest/Cath: Occluded VG->RPDA, patent LIMA->LAD and VG->pLCX. No new native dzs->Med rx. EF 25-35%.   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 08/2017 TEE: EF 30-35%, sept/ant/inf HK, apical AK. 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 12/2017 Echo: EF 45-50%, diff HK; c. 09/2022 Echo: EF 55-60%; d. 03/2023 Echo (in settng of PE): EF 40-45%, GrI DD (imaging poor); e. 03/2023 Echo w/ definity : EF 55-60%, nl RV fxn.   Hyperlipidemia LDL goal <70     Ischemic cardiomyopathy      a. 08/2017 TEE: EF 30-35%; b. 12/2017 Echo: EF 45-50%; c. 09/2022 Echo: EF 55-60%.   Myocardial  infarction (HCC) 08/27/2017   Pulmonary embolism (HCC)      a. 03/2023 Cardiac Arrest/PE req thrombectomy - CTA: Moderate right sided arterial clot burden with occlusive clot in the right upper lobe anterior and posterior segmental arteries and a nonoccluding thrombus in the right lower lobe main artery and superior and posterior basal segment arteries.   Shingles      2018 - right side   Vertigo      several months ago               Past Surgical History:  Procedure Laterality Date   CARDIAC CATHETERIZATION       CATARACT EXTRACTION W/PHACO Left 07/18/2018    Procedure: CATARACT EXTRACTION PHACO AND INTRAOCULAR LENS PLACEMENT (IOC)  LEFT;  Surgeon: Myrna Adine Anes, MD;  Location[facility];  Service: Ophthalmology;  Laterality: Left;   CATARACT EXTRACTION W/PHACO Right 08/14/2018    Procedure: CATARACT EXTRACTION PHACO AND INTRAOCULAR LENS PLACEMENT (IOC)  RIGHT;  Surgeon: Myrna Adine Anes, MD;  Location: [facility];  Service: Ophthalmology;  Laterality: Right;   CORONARY ARTERY BYPASS GRAFT N/A 08/27/2017    Procedure: CORONARY ARTERY BYPASS GRAFTING (CABG)  ON PUMP USING LEFT INTERNAL MAMMARY ARTERY AND LEFT GREATER SAPHENOUS VEIN VIA ENDOVEIN HARVEST;  Surgeon: Lucas Dorise POUR, MD;  Location: [facility] OR;  Service: Open Heart Surgery;  Laterality: N/A;   CORONARY/GRAFT ACUTE MI REVASCULARIZATION N/A 08/27/2017    Procedure: Coronary/Graft Acute MI Revascularization;  Surgeon: Darron Deatrice LABOR, MD;  Location: [facility] INVASIVE CV LAB;  Service: Cardiovascular;  Laterality: N/A;   CYSTOSCOPY W/ RETROGRADES Bilateral 12/25/2018    Procedure: CYSTOSCOPY WITH RETROGRADE PYELOGRAM;  Surgeon: Penne Knee, MD;  Location: [facility] ORS;  Service: Urology;  Laterality: Bilateral;   FRACTURE SURGERY Right 1958    arm and left wrist compound fracture, no metal   HERNIA REPAIR Right      inguinial   IR IMAGING GUIDED PORT INSERTION   10/15/2022   LEFT HEART CATH AND CORONARY ANGIOGRAPHY N/A  08/27/2017    Procedure: LEFT HEART CATH AND CORONARY ANGIOGRAPHY;  Surgeon: Darron Deatrice LABOR, MD;  Location: [facility] INVASIVE CV LAB;  Service: Cardiovascular;  Laterality: N/A;   PULMONARY THROMBECTOMY Bilateral 04/18/2023    Procedure: PULMONARY THROMBECTOMY;  Surgeon: Marea Selinda RAMAN, MD;  Location: [facility] INVASIVE CV LAB;  Service: Cardiovascular;  Laterality: Bilateral;   RIGHT/LEFT HEART CATH AND CORONARY/GRAFT ANGIOGRAPHY N/A 04/05/2023    Procedure: RIGHT/LEFT HEART CATH AND CORONARY/GRAFT ANGIOGRAPHY;  Surgeon: Mady Bruckner, MD;  Location: [facility] INVASIVE CV LAB;  Service: Cardiovascular;  Laterality: N/A;   TEMPORARY PACEMAKER N/A 04/05/2023    Procedure: TEMPORARY PACEMAKER;  Surgeon: Mady Bruckner, MD;  Location: [facility] INVASIVE CV LAB;  Service: Cardiovascular;  Laterality: N/A;   TRANSURETHRAL RESECTION OF BLADDER TUMOR WITH MITOMYCIN -C N/A 12/25/2018    Procedure: TRANSURETHRAL RESECTION OF BLADDER TUMOR WITH Gemcitabine ;  Surgeon: Penne Knee, MD;  Location: [facility] ORS;  Service: Urology;  Laterality: N/A;               Family History  Problem Relation Age of Onset   Heart failure Mother     Lung disease Father     Stroke Father     Cervical cancer Maternal Grandmother            Social History:  reports that he quit smoking about 25 years ago. His smoking use included cigarettes. He has been exposed to tobacco smoke. He has quit using smokeless tobacco.  His smokeless tobacco use included snuff. He reports that he does not currently use alcohol. He reports that he does not currently use drugs.   Allergies:  Allergies  No Known Allergies     Medications reviewed.       ROS Full ROS performed and is otherwise negative other than what is stated in HPI     BP 111/68   Pulse 65   Ht 6' (1.829 m)   Wt 200 lb (90.7 kg)   SpO2 98%   BMI 27.12 kg/m    Physical Exam Alert and oriented x 3, no work of breathing on room air, regular rate and rhythm, abdomen soft,  nontender nondistended, left inguinal hernia is easily reducible when he is standing up and reduces spontaneously when he lays down.       Lab Results Last 48 Hours  No results found for this or any previous visit (from the past 48 hours).    Imaging Results (Last 48 hours)  PERIPHERAL VASCULAR CATHETERIZATION Result Date: 10/26/2023 See surgical note for result.      Assessment/Plan:   78 year old male with significant cardiac, vascular disease who is also on Eliquis  for  PE who presents with left inguinal hernia.  He has undergone cardiac restratification and is planning for carotid angiogram tomorrow.  I discussed with him that I am willing to proceed with surgery and do an open left inguinal hernia repair.  We discussed the risk, benefits alternatives to the procedure including risk of infection, bleeding, damage to the vas deferens, testicular ischemia, recurrence, chronic pain.  I would like to see the the outcome of his carotid stenting before we proceed with surgery and give him some time after this.  We will plan for mid October to get this done.  He will need to hold his Eliquis  3 days prior to the procedure.   A total of 35 minutes was spent reviewing the patient's chart, performing an interval history and physical and discussing treatment options with the patient   Jayson Endow, M.D. [facility]

## 2023-12-05 NOTE — Anesthesia Procedure Notes (Signed)
 Procedure Name: LMA Insertion Date/Time: 12/05/2023 7:44 AM  Performed by: Jackye Spanner, CRNAPre-anesthesia Checklist: Patient identified, Patient being monitored, Timeout performed, Emergency Drugs available and Suction available Patient Re-evaluated:Patient Re-evaluated prior to induction Oxygen Delivery Method: Circle system utilized Preoxygenation: Pre-oxygenation with 100% oxygen Induction Type: IV induction Ventilation: Mask ventilation without difficulty LMA: LMA inserted LMA Size: 4.0 Tube type: Oral Number of attempts: 1 Placement Confirmation: positive ETCO2 and breath sounds checked- equal and bilateral Tube secured with: Tape Dental Injury: Teeth and Oropharynx as per pre-operative assessment  Comments: Igel LMA placed smoothly without complications.

## 2023-12-05 NOTE — Transfer of Care (Signed)
 Immediate Anesthesia Transfer of Care Note  Patient: Cory Hess  Procedure(s) Performed: REPAIR, HERNIA, INGUINAL, ADULT (Left) INSERTION OF MESH  Patient Location: PACU  Anesthesia Type:General  Level of Consciousness: drowsy  Airway & Oxygen Therapy: Patient Spontanous Breathing and Patient connected to face mask oxygen  Post-op Assessment: Report given to RN and Post -op Vital signs reviewed and stable  Post vital signs: Reviewed and stable  Last Vitals:  Vitals Value Taken Time  BP 127/70 12/05/23 09:12  Temp 36.7 C 12/05/23 09:12  Pulse 74 12/05/23 09:14  Resp 22 12/05/23 09:14  SpO2 100 % 12/05/23 09:14  Vitals shown include unfiled device data.  Last Pain:  Vitals:   12/05/23 0621  TempSrc: Temporal  PainSc: 0-No pain         Complications: No notable events documented.

## 2023-12-05 NOTE — Op Note (Signed)
 Operative note  Preoperative diagnosis: Left inguinal hernia Postoperative diagnosis: Left inguinal hernia Surgeon: Jayson Hobby, MD EBL 5 cc Procedure: Left open inguinal hernia repair with mesh.  After informed consent was obtained the patient was brought to the operating room placed supine on the operating room table.  General endotracheal anesthesia was then induced.  A surgical timeout was called identifying correct patient, site, side and procedure.  A standard groin incision was made in the left groin and taken down to the subcutaneous tissue with Bovie cautery.  The external oblique was identified.  This was opened with a scalpel and extended with Metzenbaum scissors.  Upon entry into the inguinal canal it looks like there was complete blowout of the floor of the inguinal canal.  The cord structures were encircled and secured with a Penrose drain.  Cremaster fibers were cleared off back towards the ring and there was noted to be no hernia sac along the length of the cord.  There did appear to be a direct inguinal hernia defect with blowout of the floor.  Using a sponge stick the hernia contents were reduced back into the abdomen.  A polypropylene mesh was then selected.  It was secured to the pubic tubercle with a 2-0 Prolene suture.  The inferior edge of the mesh was then sewn to the shelving edge of the inguinal ligament in a running 2-0 Prolene fashion.  The superior edge of the mesh was again secured to the pubic tubercle with another 2-0 Prolene suture.  It was secured to the conjoined tendon with a series of interrupted 2-0 Prolene sutures.  The tails of the mesh were wrapped around the cord and loosely secured together with another 2-0 Prolene suture.  The tails of the mesh were then tucked under the external oblique fascia.  The wound was then irrigated and the mesh appeared to lay flat in good condition.  The external oblique was then closed with 2-0 Vicryl after hemostasis was ensured.  The  subcutaneous tissue was then reapproximated with 2-0 Vicryl.  Skin was closed with 4-0 Monocryl and dressed with glue.  Prior to termination of the procedure all sponge and instrument counts were correct x 2.  The patient was then awoken and taken to PACU in good condition.

## 2023-12-05 NOTE — Anesthesia Preprocedure Evaluation (Signed)
 Anesthesia Evaluation  Patient identified by MRN, date of birth, ID band Patient awake    Reviewed: Allergy & Precautions, H&P , NPO status , Patient's Chart, lab work & pertinent test results, reviewed documented beta blocker date and time   History of Anesthesia Complications Negative for: history of anesthetic complications  Airway Mallampati: II  TM Distance: >3 FB Neck ROM: full    Dental  (+) Dental Advidsory Given, Edentulous Upper, Edentulous Lower   Pulmonary neg pulmonary ROS, former smoker   Pulmonary exam normal breath sounds clear to auscultation       Cardiovascular Exercise Tolerance: Good hypertension, (-) angina + Past MI and + CABG (3v CABG)  (-) Cardiac Stents Normal cardiovascular exam+ dysrhythmias (-) Valvular Problems/Murmurs Rhythm:regular Rate:Normal  ECHO 04/16/23: 1. Left ventricular ejection fraction, by estimation, is 55 to 60%. Left ventricular ejection fraction by 2D MOD biplane is 56.9 %. The left ventricle has normal function. The left ventricle has no regional wall motion abnormalities. There is mild left ventricular hypertrophy.   2. Right ventricular systolic function is normal. The right ventricular size is normal.     Neuro/Psych neg Seizures CVA  negative psych ROS   GI/Hepatic Neg liver ROS,GERD  ,,  Endo/Other  neg diabetesHypothyroidism    Renal/GU Renal disease (stage 3 CKD)  negative genitourinary   Musculoskeletal   Abdominal   Peds  Hematology negative hematology ROS (+)   Anesthesia Other Findings Past Medical History: 03/2023: Abscess of right arm     Comment:  pt states had absess to right arm after iv was inserted.              Pt states he had a wound vac for 2 weeks No date: Alternating RBBB & LBBB 2024: Ankle swelling No date: Anticoagulated on apixaban  No date: Aortic atherosclerosis No date: Arthritis of neck No date: Bilateral inguinal hernia No date:  Bladder tumor No date: BPH (benign prostatic hyperplasia) No date: CAD (coronary artery disease)     Comment:  a. 08/2017 STEMI/Cath: LM 80ost/d, LAD 100p, LCX 95               ost/p, RCA 60p, 17m, RPDA 80ost; b. 08/2017 CABG x 3:               LIMA->LAD, VG->OM, VG->PDA; c. 03/2023 Cardiac               Arrest/Cath: Occluded VG->RPDA, patent LIMA->LAD and               VG->pLCX. No new native dzs->Med rx. EF 25-35%. 09/22/2022: Cancer of base of tongue (HCC)     Comment:  a.) pathology (+) for stage II (cT2, cN2, cM0, p16+) -               Tx'd with cisplatin  04/05/2023: Cardiogenic shock (HCC) No date: Cardiomegaly No date: Carotid stenosis     Comment:  a.) s/p PTA with stent to RIGHT ICA 10/26/2023 No date: Cerebral microvascular disease No date: Cholelithiasis No date: CKD (chronic kidney disease), stage [name] (HCC) 04/05/2023: Complete heart block (HCC)     Comment:  a.) resulted in multiple episodes of PEA arrest -->               stabilized with TCP --> TVP placed during LHC. 04/08/2023: CVA (cerebral vascular accident) (HCC)     Comment:  a.) brain MRI 04/08/2023: 2 small acute infarctions  affecting the cortical brain in the RIGHT frontal region. 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 08/2017 TEE: EF 30-35%, sept/ant/inf HK, apical AK.               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 12/2017 Echo: EF 45-50%, diff              HK; c. 09/2022 Echo: EF 55-60%; d. 03/2023 Echo (in settng               of PE): EF 40-45%, GrI DD (imaging poor); e. 03/2023 Echo               w/ definity : EF 55-60%, nl RV fxn. No date: Hyperlipidemia LDL goal <70 No date: Hypothyroidism No date: Ischemic cardiomyopathy     Comment:  a. 08/2017 TEE: EF 30-35%; b. 12/2017 Echo: EF 45-50%; c.              09/2022 Echo: EF  55-60%. No date: Long-term use of aspirin  therapy No date: Nephrolithiasis 04/05/2023: PEA (Pulseless electrical activity) (HCC)     Comment:  a.) witnessed arrest at [facility] and CPR initiated               immediately --> ROSC achieved --> EMS transferred to [facility]              with multiple episodes of PEA arrest en route (further               CPR required) --> ROSC achieved in ED with further               muliple epsides of recurrent PEA arrest lasting secs-mins              --> TCP applied and patient taken to cath lab for [facility] and              TVP placement No date: Port-A-Cath in place 04/15/2023: Pulmonary embolism (HCC)     Comment:  a.) CTA chest 04/15/2023: extensive clot burden with an               occlusive clot in the RIGHT upper lobe anterior and               posterior segmental arteries, and a non occlusive               thrombus in the RIGHT lower lobe main artery and superior              and posterior basal segmental arteries --> required               thrombectomy 08/27/2017: S/P CABG x 3     Comment:  a.) LIMA-LAD, SVG-OM, SVG-PDA 2018: Shingles No date: Status post bilateral cataract extraction 08/27/2017: STEMI (ST elevation myocardial infarction) (HCC)     Comment:  a.) LHC 08/27/2017: 80% o-dLM, 100% lPAD, 95% o-pLCx,               60% pRCA, 90% mRCA, 80% oRPDA-RPDA --> unable to cross               lesions with wire --> IABP placed --> CVTS consulted and               patient transferred to [facility]; b.) s/p 3v CABG  08/27/2017: LIMA-LAD, SVG-OM, SVG-RPDA No date: Thrombocytopenia No date: Vertigo   Reproductive/Obstetrics negative OB ROS                              Anesthesia Physical Anesthesia Plan  ASA: 3  Anesthesia Plan: General   Post-op Pain Management:    Induction: Intravenous  PONV Risk Score and Plan: 2 and Ondansetron , Dexamethasone  and Treatment may vary due to age or medical  condition  Airway Management Planned: Oral ETT  Additional Equipment:   Intra-op Plan:   Post-operative Plan: Extubation in OR  Informed Consent: I have reviewed the patients History and Physical, chart, labs and discussed the procedure including the risks, benefits and alternatives for the proposed anesthesia with the patient or authorized representative who has indicated his/her understanding and acceptance.     Dental Advisory Given  Plan Discussed with: Anesthesiologist, CRNA and Surgeon  Anesthesia Plan Comments:          Anesthesia Quick Evaluation

## 2023-12-05 NOTE — Anesthesia Procedure Notes (Signed)
 Procedure Name: Intubation Date/Time: 12/05/2023 8:15 AM  Performed by: Jackye Spanner, CRNAPre-anesthesia Checklist: Patient identified, Patient being monitored, Timeout performed, Emergency Drugs available and Suction available Patient Re-evaluated:Patient Re-evaluated prior to induction Oxygen Delivery Method: Circle system utilized Preoxygenation: Pre-oxygenation with 100% oxygen Induction Type: IV induction Ventilation: Mask ventilation without difficulty Laryngoscope Size: McGrath and 4 Grade View: Grade I Tube type: Oral Tube size: 7.5 mm Number of attempts: 1 Airway Equipment and Method: Stylet Placement Confirmation: ETT inserted through vocal cords under direct vision, positive ETCO2 and breath sounds checked- equal and bilateral Secured at: 22 cm Tube secured with: Tape Dental Injury: Teeth and Oropharynx as per pre-operative assessment  Comments: Smooth atraumatic intubation, no complications noted.

## 2023-12-06 ENCOUNTER — Encounter: Payer: Self-pay | Admitting: General Surgery

## 2023-12-08 ENCOUNTER — Other Ambulatory Visit: Payer: Self-pay | Admitting: General Surgery

## 2023-12-08 ENCOUNTER — Telehealth: Payer: Self-pay | Admitting: General Surgery

## 2023-12-08 MED ORDER — OXYCODONE HCL 5 MG PO TABS: 5.0000 mg | ORAL_TABLET | Freq: Three times a day (TID) | ORAL | 0 refills | Status: DC | PRN

## 2023-12-08 NOTE — Telephone Encounter (Signed)
 Surgery was on the 13th and he only has 3 pills left and needs a refill. Pharm is [facility] in [place] on [address] PT # [phone] or [phone]

## 2023-12-09 ENCOUNTER — Emergency Department

## 2023-12-09 ENCOUNTER — Inpatient Hospital Stay
Admission: EM | Admit: 2023-12-09 | Discharge: 2023-12-13 | DRG: 308 | Disposition: A | Discharge status: Home / Self Care | Attending: Obstetrics and Gynecology | Admitting: Obstetrics and Gynecology

## 2023-12-09 ENCOUNTER — Encounter: Payer: Self-pay | Admitting: Internal Medicine

## 2023-12-09 ENCOUNTER — Other Ambulatory Visit: Payer: Self-pay

## 2023-12-09 DIAGNOSIS — K760 Fatty (change of) liver, not elsewhere classified: Secondary | ICD-10-CM | POA: Diagnosis present

## 2023-12-09 DIAGNOSIS — R42 Dizziness and giddiness: Secondary | ICD-10-CM | POA: Diagnosis not present

## 2023-12-09 DIAGNOSIS — I4510 Unspecified right bundle-branch block: Secondary | ICD-10-CM | POA: Diagnosis not present

## 2023-12-09 DIAGNOSIS — I495 Sick sinus syndrome: Secondary | ICD-10-CM | POA: Diagnosis present

## 2023-12-09 DIAGNOSIS — Z8249 Family history of ischemic heart disease and other diseases of the circulatory system: Secondary | ICD-10-CM

## 2023-12-09 DIAGNOSIS — Z79899 Other long term (current) drug therapy: Secondary | ICD-10-CM

## 2023-12-09 DIAGNOSIS — I6503 Occlusion and stenosis of bilateral vertebral arteries: Secondary | ICD-10-CM | POA: Diagnosis not present

## 2023-12-09 DIAGNOSIS — I480 Paroxysmal atrial fibrillation: Secondary | ICD-10-CM | POA: Diagnosis present

## 2023-12-09 DIAGNOSIS — Z955 Presence of coronary angioplasty implant and graft: Secondary | ICD-10-CM

## 2023-12-09 DIAGNOSIS — D62 Acute posthemorrhagic anemia: Secondary | ICD-10-CM | POA: Diagnosis not present

## 2023-12-09 DIAGNOSIS — I255 Ischemic cardiomyopathy: Secondary | ICD-10-CM | POA: Diagnosis present

## 2023-12-09 DIAGNOSIS — I2510 Atherosclerotic heart disease of native coronary artery without angina pectoris: Secondary | ICD-10-CM | POA: Diagnosis present

## 2023-12-09 DIAGNOSIS — Z951 Presence of aortocoronary bypass graft: Secondary | ICD-10-CM | POA: Diagnosis not present

## 2023-12-09 DIAGNOSIS — Z823 Family history of stroke: Secondary | ICD-10-CM

## 2023-12-09 DIAGNOSIS — I7100 Dissection of unspecified site of aorta: Secondary | ICD-10-CM | POA: Diagnosis not present

## 2023-12-09 DIAGNOSIS — I442 Atrioventricular block, complete: Principal | ICD-10-CM | POA: Diagnosis present

## 2023-12-09 DIAGNOSIS — N400 Enlarged prostate without lower urinary tract symptoms: Secondary | ICD-10-CM | POA: Diagnosis present

## 2023-12-09 DIAGNOSIS — Z923 Personal history of irradiation: Secondary | ICD-10-CM

## 2023-12-09 DIAGNOSIS — K219 Gastro-esophageal reflux disease without esophagitis: Secondary | ICD-10-CM | POA: Diagnosis present

## 2023-12-09 DIAGNOSIS — I453 Trifascicular block: Secondary | ICD-10-CM | POA: Diagnosis present

## 2023-12-09 DIAGNOSIS — Z87891 Personal history of nicotine dependence: Secondary | ICD-10-CM | POA: Diagnosis not present

## 2023-12-09 DIAGNOSIS — Z86711 Personal history of pulmonary embolism: Secondary | ICD-10-CM

## 2023-12-09 DIAGNOSIS — I5042 Chronic combined systolic (congestive) and diastolic (congestive) heart failure: Secondary | ICD-10-CM | POA: Diagnosis present

## 2023-12-09 DIAGNOSIS — Z7901 Long term (current) use of anticoagulants: Secondary | ICD-10-CM | POA: Diagnosis not present

## 2023-12-09 DIAGNOSIS — M7981 Nontraumatic hematoma of soft tissue: Secondary | ICD-10-CM | POA: Diagnosis not present

## 2023-12-09 DIAGNOSIS — N179 Acute kidney failure, unspecified: Secondary | ICD-10-CM | POA: Diagnosis present

## 2023-12-09 DIAGNOSIS — N183 Chronic kidney disease, stage 3 unspecified: Secondary | ICD-10-CM | POA: Diagnosis present

## 2023-12-09 DIAGNOSIS — E039 Hypothyroidism, unspecified: Secondary | ICD-10-CM | POA: Diagnosis not present

## 2023-12-09 DIAGNOSIS — Z825 Family history of asthma and other chronic lower respiratory diseases: Secondary | ICD-10-CM

## 2023-12-09 DIAGNOSIS — R0902 Hypoxemia: Secondary | ICD-10-CM | POA: Diagnosis present

## 2023-12-09 DIAGNOSIS — D649 Anemia, unspecified: Secondary | ICD-10-CM | POA: Diagnosis not present

## 2023-12-09 DIAGNOSIS — F419 Anxiety disorder, unspecified: Secondary | ICD-10-CM | POA: Diagnosis present

## 2023-12-09 DIAGNOSIS — I700 Atherosclerosis of aorta: Secondary | ICD-10-CM | POA: Diagnosis present

## 2023-12-09 DIAGNOSIS — I130 Hypertensive heart and chronic kidney disease with heart failure and stage 1 through stage 4 chronic kidney disease, or unspecified chronic kidney disease: Secondary | ICD-10-CM | POA: Diagnosis present

## 2023-12-09 DIAGNOSIS — I252 Old myocardial infarction: Secondary | ICD-10-CM

## 2023-12-09 DIAGNOSIS — I6523 Occlusion and stenosis of bilateral carotid arteries: Secondary | ICD-10-CM | POA: Diagnosis not present

## 2023-12-09 DIAGNOSIS — R911 Solitary pulmonary nodule: Secondary | ICD-10-CM

## 2023-12-09 DIAGNOSIS — Z8674 Personal history of sudden cardiac arrest: Secondary | ICD-10-CM

## 2023-12-09 DIAGNOSIS — Z9221 Personal history of antineoplastic chemotherapy: Secondary | ICD-10-CM

## 2023-12-09 DIAGNOSIS — Z8673 Personal history of transient ischemic attack (TIA), and cerebral infarction without residual deficits: Secondary | ICD-10-CM

## 2023-12-09 DIAGNOSIS — N281 Cyst of kidney, acquired: Secondary | ICD-10-CM | POA: Diagnosis not present

## 2023-12-09 DIAGNOSIS — M96841 Postprocedural hematoma of a musculoskeletal structure following other procedure: Secondary | ICD-10-CM | POA: Diagnosis present

## 2023-12-09 DIAGNOSIS — R570 Cardiogenic shock: Secondary | ICD-10-CM | POA: Diagnosis present

## 2023-12-09 DIAGNOSIS — Z7982 Long term (current) use of aspirin: Secondary | ICD-10-CM | POA: Diagnosis not present

## 2023-12-09 DIAGNOSIS — R7989 Other specified abnormal findings of blood chemistry: Secondary | ICD-10-CM | POA: Diagnosis present

## 2023-12-09 DIAGNOSIS — G928 Other toxic encephalopathy: Secondary | ICD-10-CM | POA: Diagnosis present

## 2023-12-09 DIAGNOSIS — R55 Syncope and collapse: Secondary | ICD-10-CM | POA: Diagnosis not present

## 2023-12-09 DIAGNOSIS — Z85810 Personal history of malignant neoplasm of tongue: Secondary | ICD-10-CM

## 2023-12-09 DIAGNOSIS — Z66 Do not resuscitate: Secondary | ICD-10-CM | POA: Diagnosis present

## 2023-12-09 DIAGNOSIS — I4891 Unspecified atrial fibrillation: Secondary | ICD-10-CM | POA: Diagnosis not present

## 2023-12-09 DIAGNOSIS — E785 Hyperlipidemia, unspecified: Secondary | ICD-10-CM | POA: Diagnosis not present

## 2023-12-09 DIAGNOSIS — I10 Essential (primary) hypertension: Secondary | ICD-10-CM | POA: Diagnosis present

## 2023-12-09 DIAGNOSIS — Y838 Other surgical procedures as the cause of abnormal reaction of the patient, or of later complication, without mention of misadventure at the time of the procedure: Secondary | ICD-10-CM | POA: Diagnosis present

## 2023-12-09 DIAGNOSIS — Z79890 Hormone replacement therapy (postmenopausal): Secondary | ICD-10-CM

## 2023-12-09 DIAGNOSIS — S3012XA Contusion of groin, initial encounter: Secondary | ICD-10-CM | POA: Diagnosis not present

## 2023-12-09 DIAGNOSIS — Z9889 Other specified postprocedural states: Secondary | ICD-10-CM | POA: Diagnosis not present

## 2023-12-09 LAB — TROPONIN I (HIGH SENSITIVITY)
Troponin I (High Sensitivity): 12 ng/L (ref <18)
Troponin I (High Sensitivity): 15 ng/L (ref <18)

## 2023-12-09 LAB — CBC WITH DIFFERENTIAL/PLATELET
Abs Immature Granulocytes: 0.02 K/uL (ref 0.00–0.07)
Basophils Absolute: 0 K/uL (ref 0.0–0.1)
Basophils Relative: 1 %
Eosinophils Absolute: 0.3 K/uL (ref 0.0–0.5)
Eosinophils Relative: 5 %
HCT: 30.8 % — ABNORMAL LOW (ref 39.0–52.0)
Hemoglobin: 10.2 g/dL — ABNORMAL LOW (ref 13.0–17.0)
Immature Granulocytes: 0 %
Lymphocytes Relative: 11 %
Lymphs Abs: 0.7 K/uL (ref 0.7–4.0)
MCH: 31.6 pg (ref 26.0–34.0)
MCHC: 33.1 g/dL (ref 30.0–36.0)
MCV: 95.4 fL (ref 80.0–100.0)
Monocytes Absolute: 0.6 K/uL (ref 0.1–1.0)
Monocytes Relative: 9 %
Neutro Abs: 5 K/uL (ref 1.7–7.7)
Neutrophils Relative %: 74 %
Platelets: 151 K/uL (ref 150–400)
RBC: 3.23 MIL/uL — ABNORMAL LOW (ref 4.22–5.81)
RDW: 12.6 % (ref 11.5–15.5)
WBC: 6.7 K/uL (ref 4.0–10.5)
nRBC: 0 % (ref 0.0–0.2)

## 2023-12-09 LAB — COMPREHENSIVE METABOLIC PANEL WITH GFR
ALT: 30 U/L (ref 0–44)
AST: 38 U/L (ref 15–41)
Albumin: 3.3 g/dL — ABNORMAL LOW (ref 3.5–5.0)
Alkaline Phosphatase: 64 U/L (ref 38–126)
Anion gap: 10 (ref 5–15)
BUN: 24 mg/dL — ABNORMAL HIGH (ref 8–23)
CO2: 27 mmol/L (ref 22–32)
Calcium: 8.5 mg/dL — ABNORMAL LOW (ref 8.9–10.3)
Chloride: 101 mmol/L (ref 98–111)
Creatinine, Ser: 1.16 mg/dL (ref 0.61–1.24)
GFR, Estimated: >60 mL/min (ref >60)
Glucose, Bld: 111 mg/dL — ABNORMAL HIGH (ref 70–99)
Potassium: 4.1 mmol/L (ref 3.5–5.1)
Sodium: 138 mmol/L (ref 135–145)
Total Bilirubin: 1 mg/dL (ref 0.0–1.2)
Total Protein: 6.3 g/dL — ABNORMAL LOW (ref 6.5–8.1)

## 2023-12-09 LAB — MAGNESIUM: Magnesium: 2.1 mg/dL (ref 1.7–2.4)

## 2023-12-09 MED ORDER — LEVOTHYROXINE SODIUM 100 MCG PO TABS
100.0000 ug | ORAL_TABLET | Freq: Every day | ORAL | Status: DC
  Administered 2023-12-10 – 2023-12-13 (×4): 100 ug via ORAL
  Filled 2023-12-09 (×4): qty 1

## 2023-12-09 MED ORDER — ACETAMINOPHEN 650 MG RE SUPP: 650.0000 mg | Freq: Four times a day (QID) | RECTAL | Status: DC | PRN

## 2023-12-09 MED ORDER — IOHEXOL 350 MG/ML SOLN
100.0000 mL | Freq: Once | INTRAVENOUS | Status: AC | PRN
  Administered 2023-12-09: 100 mL via INTRAVENOUS

## 2023-12-09 MED ORDER — IOHEXOL 350 MG/ML SOLN
75.0000 mL | Freq: Once | INTRAVENOUS | Status: AC | PRN
  Administered 2023-12-09: 75 mL via INTRAVENOUS

## 2023-12-09 MED ORDER — HYDRALAZINE HCL 20 MG/ML IJ SOLN: 5.0000 mg | Freq: Four times a day (QID) | INTRAMUSCULAR | Status: DC | PRN

## 2023-12-09 MED ORDER — ATORVASTATIN CALCIUM 80 MG PO TABS
80.0000 mg | ORAL_TABLET | Freq: Every day | ORAL | Status: DC
  Administered 2023-12-09 – 2023-12-12 (×4): 80 mg via ORAL
  Filled 2023-12-09 (×4): qty 1

## 2023-12-09 MED ORDER — CARVEDILOL 6.25 MG PO TABS
6.2500 mg | ORAL_TABLET | Freq: Two times a day (BID) | ORAL | Status: DC
  Administered 2023-12-09 – 2023-12-10 (×2): 6.25 mg via ORAL
  Filled 2023-12-09 (×2): qty 1

## 2023-12-09 MED ORDER — LOSARTAN POTASSIUM 50 MG PO TABS
25.0000 mg | ORAL_TABLET | Freq: Every day | ORAL | Status: DC
  Administered 2023-12-10: 25 mg via ORAL
  Filled 2023-12-09: qty 1

## 2023-12-09 MED ORDER — SODIUM CHLORIDE 0.9 % IV BOLUS
500.0000 mL | Freq: Once | INTRAVENOUS | Status: AC
  Administered 2023-12-09: 500 mL via INTRAVENOUS

## 2023-12-09 MED ORDER — ONDANSETRON HCL 4 MG/2ML IJ SOLN
4.0000 mg | Freq: Four times a day (QID) | INTRAMUSCULAR | Status: DC | PRN
  Administered 2023-12-10: 4 mg via INTRAVENOUS
  Filled 2023-12-09: qty 2

## 2023-12-09 MED ORDER — ACETAMINOPHEN 325 MG PO TABS: 650.0000 mg | ORAL_TABLET | Freq: Four times a day (QID) | ORAL | Status: DC | PRN

## 2023-12-09 MED ORDER — ADULT MULTIVITAMIN W/MINERALS CH
1.0000 | ORAL_TABLET | Freq: Every day | ORAL | Status: DC
  Administered 2023-12-10 – 2023-12-12 (×3): 1 via ORAL
  Filled 2023-12-09 (×4): qty 1

## 2023-12-09 MED ORDER — FAMOTIDINE 20 MG PO TABS
20.0000 mg | ORAL_TABLET | Freq: Every day | ORAL | Status: DC
  Administered 2023-12-10 – 2023-12-12 (×3): 20 mg via ORAL
  Filled 2023-12-09 (×4): qty 1

## 2023-12-09 MED ORDER — SENNOSIDES-DOCUSATE SODIUM 8.6-50 MG PO TABS: 1.0000 | ORAL_TABLET | Freq: Every evening | ORAL | Status: DC | PRN

## 2023-12-09 MED ORDER — ONDANSETRON HCL 4 MG PO TABS: 4.0000 mg | ORAL_TABLET | Freq: Four times a day (QID) | ORAL | Status: DC | PRN

## 2023-12-09 NOTE — ED Provider Notes (Signed)
.-----------------------------------------   3:45 PM on 12/09/2023 -----------------------------------------  Blood pressure (!) 146/73, pulse 73, temperature 98.4 F (36.9 C), temperature source Oral, resp. rate 13, height 6' (1.829 m), weight 94.3 kg, SpO2 100%.  Assuming care from Dr. Levander.  In short, Cory Hess is a 78 y.o. male with a chief complaint of Near Syncopal Episode .  Refer to the original H&P for additional details.  The current plan of care is to follow-up CT angio PE study, likely needs admission for his near syncope.  Discussed with hospitalist about admission, she examined him and felt that his near syncopal episode was likely related to him taking oxycodone  and not eating.  Patient is asymptomatic at this time.  Did also note that his left inguinal surgical site is more swollen, ecchymotic, he does not have any tenderness there or purulent drainage.  States the swelling started on Wednesday.  Will get a CT angio abdomen pelvis to assess.  Clinical course as below.  CT also incidentally noted a ulcerated plaque or focal dissection involving the posterior aspect of the lower abdominal aorta on the left side.  Patient is asymptomatic at this time, has equal DP pulses bilaterally.  Discussed with vascular surgery, she will evaluate the patient.  States that restarting anticoagulation will be dependent on surgery.  Discussed with hospitalist and he is admitted.  Did discuss with patient and family about CT imaging findings as well as incidental findings.  Clinical Course as of 12/09/23 1814  Fri Dec 09, 2023  1525 Signed out to oncoming physician pending CTA and anticipated admission.  [NR]  1601 CT Angio Chest PE W and/or Wo Contrast IMPRESSION: 1. No embolism to the proximal subsegmental pulmonary artery level. No lung mass, consolidation, pleural effusion or pneumothorax. 2. There is a new 5 x 6 mm ground-glass nodule in the right upper lobe. Consider short-term follow-up  examination in 3 months to document resolution versus stability. 3. Multiple other nonacute observations, as described above.  Aortic Atherosclerosis (ICD10-I70.0).   [TT]  1806 CT Angio Abd/Pel W and/or Wo Contrast IMPRESSION: 1. Sizable left groin hematoma but no extravasation of contrast material to suggest active bleeding. 2. Sizable ulcerated plaque or focal dissection involving the posterior aspect of the lower abdominal aorta on the left side measuring approximately 2.2 cm. 3. No acute abdominal/pelvic findings, mass lesions or adenopathy. 4. Enlarged prostate gland. 5. Aortic atherosclerosis.  Aortic Atherosclerosis (ICD10-I70.0).   [TT]  1807 General surgery who states that this could be an expected hematoma size after an open surgery, says no infection, states that it would be reasonable to trend his H&H's to make sure that he does not continue to drop before restarting his Eliquis .  CT incidentally noted [TT]    Clinical Course User Index [NR] Levander Slate, MD [TT] Waymond Lorelle Cummins, MD      Waymond Lorelle Cummins, MD 12/09/23 [id number]

## 2023-12-09 NOTE — ED Notes (Signed)
 Fall risk bundle is currently in place.

## 2023-12-09 NOTE — Hospital Course (Signed)
 [name] is a 78 year old male with hypothyroid, hypertension, paroxysmal atrial fibrillation on Eliquis , recent left inguinal hernia repair 10/13, who presents ED for chief concerns of near syncope while sitting at [facility].  Vitals in the ED showed t of 98, rr 17, hr 93, blood pressure 152/85, SpO2 97% on room air.  Serum sodium is 138, potassium 4.1, chloride 101, bicarb 27, BUN 24, serum creatinine 1.16, eGFR > 60, nonfasting blood glucose 111, WBC 6.7, hemoglobin 10.2, platelets of 151.  HS troponin was 12.  ED treatment: Sodium chloride  500 mL liter bolus.

## 2023-12-09 NOTE — ED Notes (Signed)
 CCMD called to add pt to monitoring at this time.

## 2023-12-09 NOTE — Assessment & Plan Note (Signed)
 Levothyroxine 100 mcg daily before breakfast

## 2023-12-09 NOTE — Assessment & Plan Note (Signed)
-   Atorvastatin 80 mg nightly ?

## 2023-12-09 NOTE — H&P (Signed)
 History and Physical   Cory Hess FMW:[id number] DOB: 04/18/1945 DOA: 12/09/2023  PCP: Towana Small, FNP  Patient coming from: [employer] via EMS  I have personally briefly reviewed patient's old medical records in [facility] EMR.  Chief Concern: Near syncope  HPI: Cory Hess is a 78 year old male with hypothyroid, hypertension, paroxysmal atrial fibrillation on Eliquis , recent left inguinal hernia repair 10/13, who presents ED for chief concerns of near syncope while sitting at [facility].  Vitals in the ED showed t of 98, rr 17, hr 93, blood pressure 152/85, SpO2 97% on room air.  Serum sodium is 138, potassium 4.1, chloride 101, bicarb 27, BUN 24, serum creatinine 1.16, eGFR > 60, nonfasting blood glucose 111, WBC 6.7, hemoglobin 10.2, platelets of 151.  HS troponin was 12.  ED treatment: Sodium chloride  500 mL liter bolus. --------------------------------------- At bedside, patient was able to tell me his first last name, age, location, current calendar year.  He reports he had breakfast at 6 AM on the same day of presentation.  At breakfast, he had grits with sugar and several many muffins.  At around 9:30 AM, he went to [employer] to meet with some old time friends.  They hung out at [facility].  He took 1 dose of oxycodone  that was prescribed to him due to the hernia repair surgery.  He did not eat with this medication.  At approximately 1130, while at [facility], he developed dizziness and he felt like his eyes rolling in the back of his head and extreme weakness.  He denies loss of consciousness and states he was aware the entire time.  He reports this is never happened before.  He reports this feels different from other episodes of vertigo that he has been diagnosed.  He denies known trauma to his person.  He reports that his left groin has had increased swelling since surgery especially on the 2nd and 3rd day postop.  Social history: He lives at home.  He  denies tobacco, EtOH, recreational drug use.  He is retired.  ROS: Constitutional: no weight change, no fever ENT/Mouth: no sore throat, no rhinorrhea Eyes: no eye pain, no vision changes Cardiovascular: no chest pain, no dyspnea,  no edema, no palpitations Respiratory: no cough, no sputum, no wheezing Gastrointestinal: no nausea, no vomiting, no diarrhea, no constipation Genitourinary: no urinary incontinence, no dysuria, no hematuria Musculoskeletal: no arthralgias, no myalgias Skin: no skin lesions, no pruritus, Neuro: + weakness, no loss of consciousness, + near syncope, + dizziness Psych: no anxiety, no depression, + decrease appetite Heme/Lymph: no bruising, no bleeding  ED Course: Discussed with EDP, patient requiring hospitalization for chief concerns of dizziness, near syncope concerning for blood loss anemia.  Assessment/Plan  Principal Problem:   Acute blood loss anemia Active Problems:   Hx of CABG   Coronary artery disease   Near syncope   Essential hypertension   Acquired hypothyroidism   Assessment and Plan:  * Acute blood loss anemia Sizable left groin hematoma without extravasation of contrast material to suggest active bleeding Sizable ulcerated plaque or focal dissection involving posterior aspect of the lower abdominal aorta on the left side measuring approximately 2.2 cm Admit patient to observation, telemetry cardiac Hold home Eliquis  Recheck CBC in the a.m., if hemoglobin remained stable patient can be discharged and resumed on home Eliquis  General Surgery has been consulted appreciate further recommendation and evaluation Vascular has been consulted by EDP, further evaluation and recommendation Goal hemoglobin greater than 8  Near syncope  I suspect this is multifactorial including blood loss leading to sizable hematoma complicated by patient having breakfast really early in the morning at 6 AM and then taking oxycodone  for pain while at [facility]  without eating, this led to an episode of dizziness and near syncope Healthy diet ordered  Coronary artery disease Atorvastatin  80 mg nightly  Acquired hypothyroidism Levothyroxine  100 mcg daily before breakfast  Essential hypertension Home carvedilol  6.25 mg p.o. twice daily with meals, losartan  25 mg daily were resumed on admission Hydralazine  5 mg IV every 6 hours as needed for SBP greater 165, 5 days ordered  Chart reviewed.   DVT prophylaxis: Pharmacologic DVT not initiated on admission.  AM team to initiate pharmacologic DVT when the benefits outweigh the risk.  Code Status: DNR/DNI, ACP reviewed Diet: Healthy diet Family Communication: updated Sister at bedside with patient's permission Disposition Plan: Pending clinical course Consults called: Vascular, general surgery Admission status: Metric cardiac, observation  Past Medical History:  Diagnosis Date   Abscess of right arm 03/2023   pt states had absess to right arm after iv was inserted. Pt states he had a wound vac for 2 weeks   Alternating RBBB & LBBB    Ankle swelling 2024   Anticoagulated on apixaban     Aortic atherosclerosis    Arthritis of neck    Bilateral inguinal hernia    Bladder tumor    BPH (benign prostatic hyperplasia)    CAD (coronary artery disease)    a. 08/2017 STEMI/Cath: LM 80ost/d, LAD 100p, LCX 95 ost/p, RCA 60p, 80m, RPDA 80ost; b. 08/2017 CABG x 3: LIMA->LAD, VG->OM, VG->PDA; c. 03/2023 Cardiac Arrest/Cath: Occluded VG->RPDA, patent LIMA->LAD and VG->pLCX. No new native dzs->Med rx. EF 25-35%.   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   CVA (cerebral vascular accident) (HCC) 04/08/2023   a.) brain MRI 04/08/2023: 2 small acute infarctions affecting the cortical brain in the RIGHT frontal region.   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 08/2017 TEE: EF 30-35%, sept/ant/inf HK, apical AK. 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 12/2017 Echo: EF 45-50%, diff HK; c. 09/2022 Echo: EF 55-60%; d. 03/2023 Echo (in settng of PE): EF 40-45%, GrI DD (imaging poor); e. 03/2023 Echo w/ definity : EF 55-60%, nl RV fxn.   Hyperlipidemia LDL goal <70    Hypothyroidism    Ischemic cardiomyopathy    a. 08/2017 TEE: EF 30-35%; b. 12/2017 Echo: EF 45-50%; c. 09/2022 Echo: EF 55-60%.   Long-term use of aspirin  therapy    Nephrolithiasis    PEA (Pulseless electrical activity) (HCC) 04/05/2023   a.) witnessed arrest at [facility] and CPR initiated immediately --> ROSC achieved --> EMS transferred to [facility] with multiple episodes of PEA arrest en route (further CPR required) --> ROSC achieved in ED with further muliple epsides of recurrent PEA  arrest lasting secs-mins --> TCP applied and patient taken to cath lab for [facility] and TVP placement   Port-A-Cath in place    Pulmonary embolism (HCC) 04/15/2023   a.) CTA chest 04/15/2023: extensive clot burden with an occlusive clot in the RIGHT upper lobe anterior and posterior segmental arteries, and a non occlusive thrombus in the RIGHT lower lobe main artery and superior and posterior basal segmental arteries --> required thrombectomy   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) LIMA-LAD, SVG-OM, SVG-PDA   Shingles 2018   Status post bilateral cataract extraction    STEMI (ST elevation myocardial infarction) (HCC) 08/27/2017   a.) LHC 08/27/2017: 80% o-dLM, 100% lPAD, 95% o-pLCx, 60% pRCA, 90% mRCA, 80% oRPDA-RPDA -->  unable to cross lesions with wire --> IABP placed --> CVTS consulted and patient transferred to [facility]; b.) s/p 3v CABG 08/27/2017: LIMA-LAD, SVG-OM, SVG-RPDA   Thrombocytopenia    Vertigo    Past Surgical History:  Procedure Laterality Date   CARDIAC CATHETERIZATION     CAROTID PTA/STENT INTERVENTION Right 10/26/2023   Procedure: CAROTID PTA/STENT INTERVENTION;  Surgeon: Marea Selinda RAMAN, MD;  Location: [facility] INVASIVE CV LAB;  Service: Cardiovascular;  Laterality: Right;   CATARACT EXTRACTION W/PHACO Left 07/18/2018   Procedure: CATARACT EXTRACTION PHACO AND INTRAOCULAR LENS PLACEMENT (IOC)  LEFT;  Surgeon: Myrna Adine Anes, MD;  Location: [facility];  Service: Ophthalmology;  Laterality: Left;   CATARACT EXTRACTION W/PHACO Right 08/14/2018   Procedure: CATARACT EXTRACTION PHACO AND INTRAOCULAR LENS PLACEMENT (IOC)  RIGHT;  Surgeon: Myrna Adine Anes, MD;  Location: [facility];  Service: Ophthalmology;  Laterality: Right;   CORONARY ARTERY BYPASS GRAFT N/A 08/27/2017   Procedure: CORONARY ARTERY BYPASS GRAFTING (CABG) ON PUMP USING LEFT INTERNAL MAMMARY ARTERY AND LEFT GREATER SAPHENOUS VEIN VIA ENDOVEIN HARVEST;  Surgeon: Lucas Dorise POUR, MD;  Location: [facility] OR;  Service: Open Heart Surgery;  Laterality: N/A;   CORONARY/GRAFT ACUTE MI REVASCULARIZATION N/A 08/27/2017   Procedure: Coronary/Graft Acute MI Revascularization;  Surgeon: Darron Deatrice LABOR, MD;  Location: [facility] INVASIVE CV LAB;  Service: Cardiovascular;  Laterality: N/A;   CYSTOSCOPY W/ RETROGRADES Bilateral 12/25/2018   Procedure: CYSTOSCOPY WITH RETROGRADE PYELOGRAM;  Surgeon: Penne Knee, MD;  Location: [facility] ORS;  Service: Urology;  Laterality: Bilateral;   FRACTURE SURGERY Right 1958   arm and left wrist compound fracture, no metal   HERNIA REPAIR Right    inguinial   INGUINAL HERNIA REPAIR Left 12/05/2023   Procedure: REPAIR, HERNIA, INGUINAL, ADULT;  Surgeon: Marinda Jayson KIDD, MD;  Location: [facility] ORS;   Service: General;  Laterality: Left;  open procedure with mesh   INSERTION OF MESH  12/05/2023   Procedure: INSERTION OF MESH;  Surgeon: Marinda Jayson KIDD, MD;  Location: [facility] ORS;  Service: General;;   IR IMAGING GUIDED PORT INSERTION  10/15/2022   LEFT HEART CATH AND CORONARY ANGIOGRAPHY N/A 08/27/2017   Procedure: LEFT HEART CATH AND CORONARY ANGIOGRAPHY;  Surgeon: Darron Deatrice LABOR, MD;  Location: [facility] CV [facility];  Service: Cardiovascular;  Laterality: N/A;   PULMONARY THROMBECTOMY Bilateral 04/18/2023   Procedure: PULMONARY THROMBECTOMY;  Surgeon: Marea Selinda RAMAN, MD;  Location: [facility];  Service: Cardiovascular;  Laterality: Bilateral;   RIGHT/LEFT HEART CATH AND CORONARY/GRAFT ANGIOGRAPHY N/A 04/05/2023   Procedure: RIGHT/LEFT HEART CATH AND CORONARY/GRAFT ANGIOGRAPHY;  Surgeon: Mady Bruckner, MD;  Location: [facility] CV [facility];  Service: Cardiovascular;  Laterality: N/A;   TEMPORARY PACEMAKER N/A 04/05/2023   Procedure: TEMPORARY  PACEMAKER;  Surgeon: Mady Bruckner, MD;  Location: [facility];  Service: Cardiovascular;  Laterality: N/A;   TRANSURETHRAL RESECTION OF BLADDER TUMOR WITH MITOMYCIN -C N/A 12/25/2018   Procedure: TRANSURETHRAL RESECTION OF BLADDER TUMOR WITH Gemcitabine ;  Surgeon: Penne Knee, MD;  Location: [facility] ORS;  Service: Urology;  Laterality: N/A;   Social History:  reports that he quit smoking about 25 years ago. His smoking use included cigarettes. He has been exposed to tobacco smoke. He has quit using smokeless tobacco.  His smokeless tobacco use included snuff. He reports that he does not currently use alcohol. He reports that he does not currently use drugs.  No Known Allergies Family History  Problem Relation Age of Onset   Heart failure Mother    Lung disease Father    Stroke Father    Cervical cancer Maternal Grandmother    Family history: Family history reviewed and not pertinent.  Prior to Admission medications    Medication Sig Start Date End Date Taking? Authorizing Provider  oxyCODONE  (ROXICODONE ) 5 MG immediate release tablet Take 1 tablet (5 mg total) by mouth every 8 (eight) hours as needed. 12/08/23 12/07/24  Marinda Jayson KIDD, MD  acetaminophen  (TYLENOL ) 500 MG tablet Take 1,000 mg by mouth in the morning and at bedtime.    [provider]  apixaban  (ELIQUIS ) 5 MG TABS tablet Take 1 tablet (5 mg total) by mouth 2 (two) times daily. 12/07/23   Marinda Jayson KIDD, MD  aspirin  81 MG chewable tablet Place 1 tablet (81 mg total) into feeding tube daily. 12/07/23   Marinda Jayson KIDD, MD  atorvastatin  (LIPITOR ) 80 MG tablet Place 1 tablet (80 mg total) into feeding tube daily. Patient taking differently: Take 80 mg by mouth daily. 09/13/23   Abigail Bernardino HERO, PA-C  Blood Pressure Monitoring (BLOOD PRESSURE KIT) KIT 1 each by Does not apply route in the morning, at noon, and at bedtime. 07/25/23   Butler, Kristina, FNP  carvedilol  (COREG ) 6.25 MG tablet Take 1 tablet (6.25 mg total) by mouth 2 (two) times daily with a meal. 08/11/23   Vivienne Bruckner Ingle, NP  famotidine  (PEPCID ) 20 MG tablet Place 1 tablet (20 mg total) into feeding tube daily. Patient taking differently: Take 20 mg by mouth daily. 09/28/23   Vivienne Bruckner Ingle, NP  levothyroxine  (SYNTHROID ) 100 MCG tablet Take 1 tablet (100 mcg total) by mouth daily before breakfast. 08/23/23   Brahmanday, Govinda R, MD  losartan  (COZAAR ) 25 MG tablet Take 1 tablet (25 mg total) by mouth daily. 08/11/23 12/05/23  Vivienne Bruckner Ingle, NP  Multiple Vitamin (MULTIVITAMIN WITH MINERALS) TABS tablet Take 1 tablet by mouth daily.    [provider]  oxyCODONE  (OXY IR/ROXICODONE ) 5 MG immediate release tablet Take 1 tablet (5 mg total) by mouth every 6 (six) hours as needed for severe pain (pain score 7-10). 12/05/23   Marinda Jayson KIDD, MD   Physical Exam: Vitals:   12/09/23 1211 12/09/23 1212 12/09/23 1600  BP: (!) 146/73  (!) 152/85   Pulse: 73  93  Resp: 13  17  Temp: 98.4 F (36.9 C)  98 F (36.7 C)  TempSrc: Oral  Oral  SpO2: 100%  97%  Weight:  94.3 kg   Height:  6' (1.829 m)    Constitutional: appears age-appropriate, weak, frail Eyes: PERRL, lids and conjunctivae normal ENMT: Mucous membranes are moist. Posterior pharynx clear of any exudate or lesions. Age-appropriate dentition. Hearing appropriate Neck: normal, supple, no masses, no thyromegaly  Respiratory: clear to auscultation bilaterally, no wheezing, no crackles. Normal respiratory effort. No accessory muscle use.  Cardiovascular: Regular rate and rhythm, no murmurs / rubs / gallops. No extremity edema. 2+ pedal pulses. No carotid bruits.  Abdomen: no tenderness, no masses palpated, no hepatosplenomegaly. Bowel sounds positive.  Musculoskeletal: no clubbing / cyanosis. No joint deformity upper and lower extremities. Good ROM, no contractures, no atrophy. Normal muscle tone.  Skin: Left groin swelling with firmness and extensive ecchymosis extending to the penis circumferentially, left groin tenderness Neurologic: Sensation intact. Strength 5/5 in all 4.  Psychiatric: Normal judgment and insight. Alert and oriented x 3. Normal mood.   EKG: independently reviewed, showing sinus rhythm with rate of 75, QTc 493  Chest x-ray on Admission: Not indicated at this time  CT Angio Abd/Pel W and/or Wo Contrast Result Date: 12/09/2023 CLINICAL DATA:  History of left inguinal hernia repair with hematoma. Suspicion for active bleeding. EXAM: CTA ABDOMEN AND PELVIS WITHOUT AND WITH CONTRAST TECHNIQUE: Multidetector CT imaging of the abdomen and pelvis was performed using the standard protocol during bolus administration of intravenous contrast. Multiplanar reconstructed images and MIPs were obtained and reviewed to evaluate the vascular anatomy. RADIATION DOSE REDUCTION: This exam was performed according to the departmental dose-optimization program which includes  automated exposure control, adjustment of the mA and/or kV according to patient size and/or use of iterative reconstruction technique. CONTRAST:  OMNIPAQUE  IOHEXOL  350 MG/ML SOLN COMPARISON:  Chest CT scan earlier, same date. Prior abdominal CT scan and 07/17/2023 FINDINGS: VASCULAR Aorta: Stable moderate to advanced age related atherosclerotic calcifications but no aneurysm. Sizable ulcerated plaque or focal dissection noted involving the posterior aspect of the lower abdominal aorta on the left side measuring approximately 2.2 cm. Celiac: Atherosclerotic calcifications but no significant stenosis or dissection. SMA: Atherosclerotic calcifications but no significant stenosis. Renals: 2 renal arteries bilaterally. Two right and 3 left renal arteries no significant stenosis. Scattered atherosclerotic calcifications. IMA: Patent Inflow: Moderate atherosclerotic calcifications but no stenosis or dissection. Proximal Outflow: No significant findings. Scattered calcifications. Veins: No significant findings. Review of the MIP images confirms the above findings. NON-VASCULAR Lower chest: No acute pulmonary findings. Pulmonary scarring changes. Hepatobiliary: No hepatic lesions or intrahepatic biliary dilatation. Gallbladder is unremarkable. No common bile duct dilatation. Pancreas: Unremarkable. No pancreatic ductal dilatation or surrounding inflammatory changes. Spleen: Normal in size without focal abnormality. Adrenals/Urinary Tract: Adrenal glands and kidneys are unremarkable. There is contrast in the renal collecting systems from prior CT scan. Bilateral renal cysts, unchanged. The bladder is unremarkable. Stomach/Bowel: The stomach, duodenum, small bowel and colon are grossly normal without oral contrast. No inflammatory changes, mass lesions or obstructive findings. The appendix is normal. Lymphatic: No abdominal or pelvic lymphadenopathy. Reproductive: Enlarged prostate gland. The seminal vesicles are  unremarkable. Other: There is a sizable left groin hematoma measuring approximately 7.8 x 2.7 cm. This is anterior to the inguinal canal. No extravasation of contrast material to suggest active bleeding. Small tortuous vessels in the spermatic cords bilaterally. Musculoskeletal: No significant bony findings. IMPRESSION: 1. Sizable left groin hematoma but no extravasation of contrast material to suggest active bleeding. 2. Sizable ulcerated plaque or focal dissection involving the posterior aspect of the lower abdominal aorta on the left side measuring approximately 2.2 cm. 3. No acute abdominal/pelvic findings, mass lesions or adenopathy. 4. Enlarged prostate gland. 5. Aortic atherosclerosis. Aortic Atherosclerosis (ICD10-I70.0). Electronically Signed   By: MYRTIS Stammer M.D.   On: 12/09/2023 18:02   CT Angio Chest PE W  and/or Wo Contrast Result Date: 12/09/2023 CLINICAL DATA:  Pulmonary embolism (PE) suspected, high prob. Near syncopal episode. History of tongue carcinoma. * Tracking Code: BO * EXAM: CT ANGIOGRAPHY CHEST WITH CONTRAST TECHNIQUE: Multidetector CT imaging of the chest was performed using the standard protocol during bolus administration of intravenous contrast. Multiplanar CT image reconstructions and MIPs were obtained to evaluate the vascular anatomy. RADIATION DOSE REDUCTION: This exam was performed according to the departmental dose-optimization program which includes automated exposure control, adjustment of the mA and/or kV according to patient size and/or use of iterative reconstruction technique. CONTRAST:  75mL OMNIPAQUE  IOHEXOL  350 MG/ML SOLN COMPARISON:  CT angiography chest from 07/17/2023. FINDINGS: Cardiovascular: No evidence of embolism to the proximal subsegmental pulmonary artery level. Mild cardiomegaly. No pericardial effusion. No aortic aneurysm. There are coronary artery calcifications, in keeping with coronary artery disease. There are also mild peripheral atherosclerotic  vascular calcifications of thoracic aorta and its major branches. Mediastinum/Nodes: Visualized thyroid  gland appears grossly unremarkable. No solid / cystic mediastinal masses. The esophagus is nondistended precluding optimal assessment. No axillary, mediastinal or hilar lymphadenopathy by size criteria. Lungs/Pleura: The central tracheo-bronchial tree is patent. There is mild, smooth, circumferential thickening of the segmental and subsegmental bronchial walls, throughout bilateral lungs, which is nonspecific. Findings are most commonly seen with bronchitis or reactive airway disease, such as asthma. There are dependent changes in bilateral lungs. No mass or consolidation. No pleural effusion or pneumothorax. There is a new 5 x 6 mm ground-glass nodule in the right upper lobe (series 5, image 62). Upper Abdomen: Visualized upper abdominal viscera within normal limits. Musculoskeletal: The visualized soft tissues of the chest wall are grossly unremarkable. No suspicious osseous lesions. There are mild to moderate multilevel degenerative changes in the visualized spine. Review of the MIP images confirms the above findings. IMPRESSION: 1. No embolism to the proximal subsegmental pulmonary artery level. No lung mass, consolidation, pleural effusion or pneumothorax. 2. There is a new 5 x 6 mm ground-glass nodule in the right upper lobe. Consider short-term follow-up examination in 3 months to document resolution versus stability. 3. Multiple other nonacute observations, as described above. Aortic Atherosclerosis (ICD10-I70.0). Electronically Signed   By: Ree Molt M.D.   On: 12/09/2023 15:51   Labs on Admission: I have personally reviewed following labs  CBC: Recent Labs  Lab 12/09/23 1313  WBC 6.7  NEUTROABS 5.0  HGB 10.2*  HCT 30.8*  MCV 95.4  PLT 151   Basic Metabolic Panel: Recent Labs  Lab 12/09/23 1313  NA 138  K 4.1  CL 101  CO2 27  GLUCOSE 111*  BUN 24*  CREATININE 1.16  CALCIUM   8.5*  MG 2.1   GFR: Estimated Creatinine Clearance: 63.6 mL/min (by C-G formula based on SCr of 1.16 mg/dL).  Liver Function Tests: Recent Labs  Lab 12/09/23 1313  AST 38  ALT 30  ALKPHOS 64  BILITOT 1.0  PROT 6.3*  ALBUMIN  3.3*   Urine analysis:    Component Value Date/Time   COLORURINE YELLOW (A) 07/17/2023 1936   APPEARANCEUR CLEAR (A) 07/17/2023 1936   APPEARANCEUR Hazy (A) 04/17/2019 1454   LABSPEC 1.044 (H) 07/17/2023 1936   PHURINE 7.0 07/17/2023 1936   GLUCOSEU NEGATIVE 07/17/2023 1936   HGBUR NEGATIVE 07/17/2023 1936   BILIRUBINUR NEGATIVE 07/17/2023 1936   BILIRUBINUR Negative 04/17/2019 1454   KETONESUR NEGATIVE 07/17/2023 1936   PROTEINUR NEGATIVE 07/17/2023 1936   NITRITE NEGATIVE 07/17/2023 1936   LEUKOCYTESUR NEGATIVE 07/17/2023 1936   This  document was prepared using Conservation officer, historic buildings and may include unintentional dictation errors.  Dr. Sherre [facility]  If 7PM-7AM, please contact overnight-coverage provider If 7AM-7PM, please contact day attending provider [email or web address]  12/09/2023, 6:19 PM

## 2023-12-09 NOTE — ED Provider Notes (Addendum)
 [facility] Provider Note    Event Date/Time   First MD Initiated Contact with Patient 12/09/23 1219     (approximate)   History   Near Syncopal Episode   HPI  Cory Hess is a 78 year old male with history of CAD s/p CABG, hypertension, alternating bundle branch block, ischemic cardiomyopathy, CHF presenting to the emergency department for evaluation following a near syncopal episode.  Sitting in a booth at [facility] when he had onset of dizziness and slumped down on the bench, but does not think he fully passed out.  People were around him, did not hit his head.  Denies similar episodes.  Denies chest pain or shortness of breath.  Reviewed cardiology visit from 08/11/2023.  At that time he is seen in follow-up after a hospitalization for PEA arrest in the setting of PE, follow-up on his CAD and CHF.  Does have a history of briefly requiring a temporary wire in the setting of PEA and asystolic arrest in February.  Patient does not remember having any cardiorespiratory symptoms before he was diagnosed with a PE.  More recently underwent left inguinal hernia repair with general surgery on 10/13.  Held Eliquis  for 7 days prior to surgery, plan to resume today.     Physical Exam   Triage Vital Signs: ED Triage Vitals  Encounter Vitals Group     BP 12/09/23 1211 (!) 146/73     Girls Systolic BP Percentile --      Girls Diastolic BP Percentile --      Boys Systolic BP Percentile --      Boys Diastolic BP Percentile --      Pulse Rate 12/09/23 1211 73     Resp 12/09/23 1211 13     Temp 12/09/23 1211 98.4 F (36.9 C)     Temp Source 12/09/23 1211 Oral     SpO2 12/09/23 1211 100 %     Weight 12/09/23 1212 208 lb (94.3 kg)     Height 12/09/23 1212 6' (1.829 m)     Head Circumference --      Peak Flow --      Pain Score --      Pain Loc --      Pain Education --      Exclude from Growth Chart --     Most recent vital signs: Vitals:   12/09/23 1211   BP: (!) 146/73  Pulse: 73  Resp: 13  Temp: 98.4 F (36.9 C)  SpO2: 100%     General: Awake, interactive  CV:  Good peripheral perfusion, regular rate and rhythm Resp:  Unlabored respirations, lungs clear to auscultation Abd:  Nondistended, appropriate tenderness to palpation in the left lower quadrant with areas of ecchymosis in his left groin in the setting of recent surgery Neuro:  Symmetric facial movement, fluid speech    ED Results / Procedures / Treatments   Labs (all labs ordered are listed, but only abnormal results are displayed) Labs Reviewed  CBC WITH DIFFERENTIAL/PLATELET - Abnormal; Notable for the following components:      Result Value   RBC 3.23 (*)    Hemoglobin 10.2 (*)    HCT 30.8 (*)    All other components within normal limits  COMPREHENSIVE METABOLIC PANEL WITH GFR - Abnormal; Notable for the following components:   Glucose, Bld 111 (*)    BUN 24 (*)    Calcium  8.5 (*)    Total Protein 6.3 (*)  Albumin  3.3 (*)    All other components within normal limits  MAGNESIUM   TROPONIN I (HIGH SENSITIVITY)  TROPONIN I (HIGH SENSITIVITY)     EKG EKG independently reviewed and interpreted by myself demonstrates:  EKG demonstrate sinus rhythm at a rate of 75, PR 237, QRS 168, QTc 493, 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  PROCEDURES:  Critical Care performed: No  Procedures   MEDICATIONS ORDERED IN ED: Medications  iohexol  (OMNIPAQUE ) 350 MG/ML injection 75 mL (75 mLs Intravenous Contrast Given 12/09/23 1434)  sodium chloride  0.9 % bolus 500 mL (500 mLs Intravenous New Bag/Given 12/09/23 1450)     IMPRESSION / MDM / ASSESSMENT AND PLAN / ED COURSE  I reviewed the triage vital signs and  the nursing notes.  Differential diagnosis includes, but is not limited to arrhythmia, anemia, electrolyte abnormality, PE, dehydration  Patient's presentation is most consistent with acute presentation with potential threat to life or bodily function.  78 year old male presenting following near syncopal episode.  Stable vitals on presentation.  Labs with mild anemia, slightly decreased from prior. CMP without critical derangements.  Negative initial troponin.  K and mag within normal limits.  Given history of significant PE causing arrest earlier this year and patient not being on anticoagulation with the past 10 days, will obtain CT of the chest to ensure no recurrent PE.  Patient with multiple risk factors.  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  Anticipate patient will require admission for further evaluation in the setting of his near syncopal episode.  Clinical Course as of 12/09/23 1526  Fri Dec 09, 2023  1525 Signed out to oncoming physician pending CTA and anticipated admission.  [NR]    Clinical Course User Index [NR] Levander Slate, MD     FINAL CLINICAL IMPRESSION(S) / ED DIAGNOSES   Final diagnoses:  Near syncope     Rx / DC Orders   ED Discharge Orders     None        Note:  This document was prepared using Dragon voice recognition software and may include unintentional dictation errors.   Levander Slate, MD 12/09/23 1526    Levander Slate, MD 12/09/23 [phone]

## 2023-12-09 NOTE — ED Triage Notes (Addendum)
 [place] EMS from restaurant with near syncopal episode. Pt states he got lightheaded but did not pass out. Pt states he had hernia repair Monday. Pt denies any complications at this time.

## 2023-12-09 NOTE — Assessment & Plan Note (Signed)
 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

## 2023-12-09 NOTE — Assessment & Plan Note (Signed)
 I suspect this is multifactorial including blood loss leading to sizable hematoma complicated by patient having breakfast really early in the morning at 6 AM and then taking oxycodone  for pain while at [facility] without eating, this led to an episode of dizziness and near syncope Healthy diet ordered

## 2023-12-09 NOTE — Assessment & Plan Note (Signed)
 Home carvedilol  6.25 mg p.o. twice daily with meals, losartan  25 mg daily were resumed on admission Hydralazine  5 mg IV every 6 hours as needed for SBP greater 165, 5 days ordered

## 2023-12-10 ENCOUNTER — Encounter
Admission: EM | Disposition: A | Discharge status: Home / Self Care | Payer: Self-pay | Source: Home / Self Care | Attending: Student in an Organized Health Care Education/Training Program

## 2023-12-10 ENCOUNTER — Observation Stay

## 2023-12-10 DIAGNOSIS — I2510 Atherosclerotic heart disease of native coronary artery without angina pectoris: Secondary | ICD-10-CM | POA: Diagnosis present

## 2023-12-10 DIAGNOSIS — Z7982 Long term (current) use of aspirin: Secondary | ICD-10-CM | POA: Diagnosis not present

## 2023-12-10 DIAGNOSIS — Z951 Presence of aortocoronary bypass graft: Secondary | ICD-10-CM | POA: Diagnosis not present

## 2023-12-10 DIAGNOSIS — Z66 Do not resuscitate: Secondary | ICD-10-CM | POA: Diagnosis present

## 2023-12-10 DIAGNOSIS — I5042 Chronic combined systolic (congestive) and diastolic (congestive) heart failure: Secondary | ICD-10-CM | POA: Diagnosis present

## 2023-12-10 DIAGNOSIS — E039 Hypothyroidism, unspecified: Secondary | ICD-10-CM

## 2023-12-10 DIAGNOSIS — I480 Paroxysmal atrial fibrillation: Secondary | ICD-10-CM | POA: Diagnosis present

## 2023-12-10 DIAGNOSIS — F419 Anxiety disorder, unspecified: Secondary | ICD-10-CM | POA: Diagnosis present

## 2023-12-10 DIAGNOSIS — E785 Hyperlipidemia, unspecified: Secondary | ICD-10-CM | POA: Diagnosis present

## 2023-12-10 DIAGNOSIS — N400 Enlarged prostate without lower urinary tract symptoms: Secondary | ICD-10-CM | POA: Diagnosis present

## 2023-12-10 DIAGNOSIS — I130 Hypertensive heart and chronic kidney disease with heart failure and stage 1 through stage 4 chronic kidney disease, or unspecified chronic kidney disease: Secondary | ICD-10-CM | POA: Diagnosis present

## 2023-12-10 DIAGNOSIS — R570 Cardiogenic shock: Secondary | ICD-10-CM

## 2023-12-10 DIAGNOSIS — Z8674 Personal history of sudden cardiac arrest: Secondary | ICD-10-CM

## 2023-12-10 DIAGNOSIS — I442 Atrioventricular block, complete: Secondary | ICD-10-CM | POA: Diagnosis present

## 2023-12-10 DIAGNOSIS — I255 Ischemic cardiomyopathy: Secondary | ICD-10-CM | POA: Diagnosis present

## 2023-12-10 DIAGNOSIS — D62 Acute posthemorrhagic anemia: Secondary | ICD-10-CM | POA: Diagnosis present

## 2023-12-10 DIAGNOSIS — Z9889 Other specified postprocedural states: Secondary | ICD-10-CM | POA: Diagnosis not present

## 2023-12-10 DIAGNOSIS — Y838 Other surgical procedures as the cause of abnormal reaction of the patient, or of later complication, without mention of misadventure at the time of the procedure: Secondary | ICD-10-CM | POA: Diagnosis present

## 2023-12-10 DIAGNOSIS — I453 Trifascicular block: Secondary | ICD-10-CM | POA: Diagnosis present

## 2023-12-10 DIAGNOSIS — K760 Fatty (change of) liver, not elsewhere classified: Secondary | ICD-10-CM | POA: Diagnosis present

## 2023-12-10 DIAGNOSIS — I10 Essential (primary) hypertension: Secondary | ICD-10-CM | POA: Diagnosis not present

## 2023-12-10 DIAGNOSIS — I6523 Occlusion and stenosis of bilateral carotid arteries: Secondary | ICD-10-CM | POA: Diagnosis not present

## 2023-12-10 DIAGNOSIS — G928 Other toxic encephalopathy: Secondary | ICD-10-CM | POA: Diagnosis present

## 2023-12-10 DIAGNOSIS — I495 Sick sinus syndrome: Secondary | ICD-10-CM | POA: Diagnosis present

## 2023-12-10 DIAGNOSIS — I6503 Occlusion and stenosis of bilateral vertebral arteries: Secondary | ICD-10-CM | POA: Diagnosis not present

## 2023-12-10 DIAGNOSIS — M96841 Postprocedural hematoma of a musculoskeletal structure following other procedure: Secondary | ICD-10-CM | POA: Diagnosis present

## 2023-12-10 DIAGNOSIS — N179 Acute kidney failure, unspecified: Secondary | ICD-10-CM | POA: Diagnosis present

## 2023-12-10 DIAGNOSIS — Z87891 Personal history of nicotine dependence: Secondary | ICD-10-CM | POA: Diagnosis not present

## 2023-12-10 DIAGNOSIS — I700 Atherosclerosis of aorta: Secondary | ICD-10-CM | POA: Diagnosis present

## 2023-12-10 DIAGNOSIS — Z7901 Long term (current) use of anticoagulants: Secondary | ICD-10-CM | POA: Diagnosis not present

## 2023-12-10 DIAGNOSIS — I7100 Dissection of unspecified site of aorta: Secondary | ICD-10-CM | POA: Diagnosis not present

## 2023-12-10 DIAGNOSIS — N183 Chronic kidney disease, stage 3 unspecified: Secondary | ICD-10-CM | POA: Diagnosis present

## 2023-12-10 DIAGNOSIS — M7981 Nontraumatic hematoma of soft tissue: Secondary | ICD-10-CM | POA: Diagnosis not present

## 2023-12-10 LAB — BASIC METABOLIC PANEL WITH GFR
Anion gap: 10 (ref 5–15)
BUN: 21 mg/dL (ref 8–23)
CO2: 26 mmol/L (ref 22–32)
Calcium: 8.8 mg/dL — ABNORMAL LOW (ref 8.9–10.3)
Chloride: 100 mmol/L (ref 98–111)
Creatinine, Ser: 1.13 mg/dL (ref 0.61–1.24)
GFR, Estimated: >60 mL/min (ref >60)
Glucose, Bld: 105 mg/dL — ABNORMAL HIGH (ref 70–99)
Potassium: 3.9 mmol/L (ref 3.5–5.1)
Sodium: 136 mmol/L (ref 135–145)

## 2023-12-10 LAB — GLUCOSE, CAPILLARY: Glucose-Capillary: 98 mg/dL (ref 70–99)

## 2023-12-10 LAB — T4, FREE: Free T4: 0.6 ng/dL — ABNORMAL LOW (ref 0.61–1.12)

## 2023-12-10 LAB — CBC
HCT: 30.6 % — ABNORMAL LOW (ref 39.0–52.0)
Hemoglobin: 10.3 g/dL — ABNORMAL LOW (ref 13.0–17.0)
MCH: 31.5 pg (ref 26.0–34.0)
MCHC: 33.7 g/dL (ref 30.0–36.0)
MCV: 93.6 fL (ref 80.0–100.0)
Platelets: 154 K/uL (ref 150–400)
RBC: 3.27 MIL/uL — ABNORMAL LOW (ref 4.22–5.81)
RDW: 12.7 % (ref 11.5–15.5)
WBC: 5.8 K/uL (ref 4.0–10.5)
nRBC: 0 % (ref 0.0–0.2)

## 2023-12-10 LAB — TSH: TSH: 42.899 u[IU]/mL — ABNORMAL HIGH (ref 0.350–4.500)

## 2023-12-10 LAB — MRSA NEXT GEN BY PCR, NASAL: MRSA by PCR Next Gen: NOT DETECTED

## 2023-12-10 LAB — TROPONIN I (HIGH SENSITIVITY): Troponin I (High Sensitivity): 49 ng/L — ABNORMAL HIGH (ref <18)

## 2023-12-10 LAB — MAGNESIUM: Magnesium: 2.1 mg/dL (ref 1.7–2.4)

## 2023-12-10 SURGERY — TEMPORARY PACEMAKER
Anesthesia: LOCAL

## 2023-12-10 MED ORDER — ATROPINE SULFATE 1 MG/10ML IJ SOSY: 0.2000 mg | PREFILLED_SYRINGE | Freq: Once | INTRAMUSCULAR | Status: AC

## 2023-12-10 MED ORDER — MIDAZOLAM HCL (PF) 2 MG/2ML IJ SOLN
INTRAMUSCULAR | Status: DC | PRN
  Administered 2023-12-10: .5 mg via INTRAVENOUS

## 2023-12-10 MED ORDER — MIDAZOLAM HCL 2 MG/2ML IJ SOLN
INTRAMUSCULAR | Status: AC
  Filled 2023-12-10: qty 2

## 2023-12-10 MED ORDER — ATROPINE SULFATE 1 MG/10ML IJ SOSY
PREFILLED_SYRINGE | INTRAMUSCULAR | Status: AC
  Filled 2023-12-10: qty 10

## 2023-12-10 MED ORDER — LIDOCAINE HCL 1 % IJ SOLN
INTRAMUSCULAR | Status: AC
  Filled 2023-12-10: qty 20

## 2023-12-10 MED ORDER — HEPARIN (PORCINE) IN NACL 1000-0.9 UT/500ML-% IV SOLN
INTRAVENOUS | Status: DC | PRN
  Administered 2023-12-10: 500 mL

## 2023-12-10 MED ORDER — FENTANYL CITRATE (PF) 100 MCG/2ML IJ SOLN
INTRAMUSCULAR | Status: DC | PRN
  Administered 2023-12-10: 25 ug via INTRAVENOUS

## 2023-12-10 MED ORDER — FENTANYL CITRATE (PF) 100 MCG/2ML IJ SOLN
INTRAMUSCULAR | Status: AC
  Filled 2023-12-10: qty 2

## 2023-12-10 MED ORDER — CALCIUM GLUCONATE-NACL 1-0.675 GM/50ML-% IV SOLN
1.0000 g | Freq: Once | INTRAVENOUS | Status: AC
  Administered 2023-12-10: 1000 mg via INTRAVENOUS
  Filled 2023-12-10 (×2): qty 50

## 2023-12-10 MED ORDER — ATROPINE SULFATE 1 MG/10ML IJ SOSY
PREFILLED_SYRINGE | INTRAMUSCULAR | Status: AC
  Administered 2023-12-10: 1 mg via INTRAVENOUS
  Filled 2023-12-10: qty 10

## 2023-12-10 MED ORDER — HEPARIN (PORCINE) IN NACL 1000-0.9 UT/500ML-% IV SOLN
INTRAVENOUS | Status: AC
  Filled 2023-12-10: qty 1000

## 2023-12-10 MED ORDER — LIDOCAINE HCL (PF) 1 % IJ SOLN
INTRAMUSCULAR | Status: DC | PRN
  Administered 2023-12-10: 10 mL

## 2023-12-10 MED ORDER — SODIUM CHLORIDE 0.9 % IV BOLUS
250.0000 mL | Freq: Once | INTRAVENOUS | Status: AC
  Administered 2023-12-10: 250 mL via INTRAVENOUS

## 2023-12-10 MED ORDER — DOPAMINE-DEXTROSE 3.2-5 MG/ML-% IV SOLN
0.0000 ug/kg/min | INTRAVENOUS | Status: DC
  Administered 2023-12-10: 5 ug/kg/min via INTRAVENOUS

## 2023-12-10 MED ORDER — CHLORHEXIDINE GLUCONATE CLOTH 2 % EX PADS
6.0000 | MEDICATED_PAD | Freq: Every day | CUTANEOUS | Status: DC
  Administered 2023-12-11 – 2023-12-12 (×2): 6 via TOPICAL

## 2023-12-10 MED ADMIN — Iohexol IV Soln 350 MG/ML: 75 mL | INTRAVENOUS | NDC 00407141490

## 2023-12-10 SURGICAL SUPPLY — 7 items
CABLE ADAPT PACING TEMP 12FT (ADAPTER) IMPLANT
CATH S G BIP PACING (CATHETERS) IMPLANT
INTRODUCER SHEATH PERCUT 6FR (MISCELLANEOUS) IMPLANT
NDL PERC 18GX7CM (NEEDLE) IMPLANT
NEEDLE PERC 18GX7CM (NEEDLE) ×1 IMPLANT
PACK CARDIAC CATH (CUSTOM PROCEDURE TRAY) ×1 IMPLANT
STATION PROTECTION PRESSURIZED (MISCELLANEOUS) IMPLANT

## 2023-12-10 NOTE — Significant Event (Signed)
 Rapid Response Event Note   Reason for Call : called RRT for symptomatic bradycardia... pt feeling woozy, HR in 30's   Initial Focused Assessment: laying in bed, complaints of dizzy/woozy feeling, HR in 30's on tele box. Able to answer all questions, see flowsheets for VS.      Interventions: Dr Dorinda and Dr Tisa to bedside, stat EKG, Cardio consult, and Neck CT (vascular rec to check on carotid stent from last month)   Plan of Care: as above, keep atropine  at bedside, RN Randine to call for further assistance...    Event Summary: as above  MD Notified: Djan/Esco Call Upfz:[id number] Arrival Upfz:[id number] End Upfz:[id number]  Revonda Menter A, RN

## 2023-12-10 NOTE — Progress Notes (Signed)
   12/10/23 1100  Spiritual Encounters  Type of Visit Follow up  Referral source Code page  Reason for visit Code  OnCall Visit Yes   Chaplain responded to Code Stemi. Patient receiving care and no family present. Chaplain services available upon request.

## 2023-12-10 NOTE — Plan of Care (Signed)
  Problem: Clinical Measurements: Goal: Ability to maintain clinical measurements within normal limits will improve Outcome: Progressing Goal: Will remain free from infection Outcome: Progressing   Problem: Activity: Goal: Risk for activity intolerance will decrease Outcome: Progressing   Problem: Nutrition: Goal: Adequate nutrition will be maintained Outcome: Progressing   Problem: Elimination: Goal: Will not experience complications related to bowel motility Outcome: Progressing Goal: Will not experience complications related to urinary retention Outcome: Progressing   Problem: Pain Managment: Goal: General experience of comfort will improve and/or be controlled Outcome: Progressing   Problem: Safety: Goal: Ability to remain free from injury will improve Outcome: Progressing

## 2023-12-10 NOTE — Anesthesia Postprocedure Evaluation (Signed)
 Anesthesia Post Note  Patient: Cory Hess  Procedure(s) Performed: REPAIR, HERNIA, INGUINAL, ADULT (Left) INSERTION OF MESH  Patient location during evaluation: PACU Anesthesia Type: General Level of consciousness: awake and alert Pain management: pain level controlled Vital Signs Assessment: post-procedure vital signs reviewed and stable Respiratory status: spontaneous breathing, nonlabored ventilation, respiratory function stable and patient connected to nasal cannula oxygen Cardiovascular status: blood pressure returned to baseline and stable Postop Assessment: no apparent nausea or vomiting Anesthetic complications: no   No notable events documented.   Last Vitals:  Vitals:   12/05/23 1000 12/05/23 1025  BP: (!) 152/71 (!) 157/82  Pulse: 80   Resp: 17 16  Temp: 36.4 C (!) 36.4 C  SpO2: 100% 99%    Last Pain:  Vitals:   12/05/23 1025  TempSrc: Temporal  PainSc: 3                  Prentice Murphy

## 2023-12-10 NOTE — Progress Notes (Addendum)
 Responded to overhead page of rapid response. On arrival pt in bed, 12 lead EKG being done. RN, Dr. Dorinda in room with pt. Pt has pulse, maintaining airway.

## 2023-12-10 NOTE — Consult Note (Signed)
 Cardiology Consultation:   Patient ID: Cory Hess; [id number]; Jul 12, 1945   Admit date: 12/09/2023 Date of Consult: 12/10/2023  Primary Care Provider: Towana Small, FNP Primary Cardiologist: End Primary Electrophysiologist:  None   Patient Profile:   Cory Hess is a 78 y.o. male with a hx of CAD status post CABG x 3 and 08/2016, HFrEF secondary to ICM, PE with PEA arrest in 03/2023, alternating bundle branch block, carotid artery disease status post recent right ICA stenting on 10/26/2023, HTN, HLD, bladder tumor status post TURBT, tongue cancer status post chemoradiation who is being seen today for the evaluation of symptomatic bradycardia at the request of Dr. Dorinda.  History of Present Illness:   Cory Hess was admitted in 08/2017 with chest pain and new left bundle branch block.  LHC showed multivessel CAD, following intra-aortic balloon pump placement he was transferred to [facility] and underwent three-vessel CABG.  Intraoperative TEE showed an EF of 30 to 35%.  Follow-up echo in 12/2017 showed improvement in LV systolic function with an EF of 45 to 50%.  Following CABG, he was maintained on clopidogrel , though this was discontinued in 10/2018 in the setting of hematuria and finding of bladder tumor subsequently undergoing TURBT in 12/2018.  Echo in 09/2022 showed an EF of 55 to 60% with moderate LVH and grade 1 diastolic dysfunction.  He was admitted in 03/2023 with out-of-hospital PEA arrest.  Emergent LHC revealed patent LIMA to LAD and vein graft to proximal LCx with occlusion of vein graft to the RPDA, native left main, and LAD occlusion, proximal LCx disease, and severe diffuse RCA disease.  EF was 25 to 35% by LV gram.  Filling pressures were moderately to severely elevated (right greater than left).  In the setting of bradycardia temporary wire was placed.  No intervention was required.  CTA of the chest showed an occlusive clot in the right upper lobe anterior and posterior  segmental arteries with nonoccluding thrombus in the right lower lobe main artery and the superior posterior basal segmental arteries.  He underwent a thrombectomy with admission further complicated by Pseudomonas pneumonia and difficulty weaning from ventilator requiring placement of tracheostomy.  Echo initially showed an EF of 40 to 45% with a follow-up echo later in the admission showing normalization of LV systolic function with an EF of 55 to 60% with normal RV function.  Following discharge from rehab, apixaban  was stopped at some point for unclear reasons, though subsequently resumed in 07/2023 with oncology recommending at least 6 months of OAC.  He was last seen in our office in 09/2023 with stable EKG findings with recommendation to continue carvedilol  6.25 mg twice daily which was a decrease in prior dose of 12.5 mg twice daily (previously reduced).  He underwent right internal carotid artery stenting on 10/26/2023.  He underwent open surgical repair of left inguinal hernia on 12/05/2023.  He was admitted to [facility] on 12/09/2023 with near syncope.  He reported being at [facility] on the morning of 10/17 with associated dizziness and near syncope.  He believes he may have actually syncopized for several seconds.  Leading up to this event he was without symptoms of angina.  Patient was transported to [facility] with initial BP 146/73 and heart rate 73 bpm.  EKG showed sinus rhythm, 75 bpm, trifascicular block with first-degree AV block, left intrafascicular block, and right bundle branch block.  CTA of the chest showed no evidence of pulmonary embolism with incidental findings noted in the report.  CTA of the abdomen and pelvis showed a sizable left groin hematoma without extravasation of contrast to suggest active bleeding as well as sizable ulcerated plaque or focal dissection involving the posterior aspect of the lower abdominal aorta on the left side measuring approximately 2.2 cm.  Labs notable for mild stable  anemia with hemoglobin around 10.2-10.3, and negative high-sensitivity troponin x 2.    On the morning of 10/18, he developed intermittent high-grade AV block followed by several prolonged pauses of up to 11.6 seconds with associated near syncope.  He did not require atropine , CPR, or transcutaneous pacing.  Prior to admission, patient was taking carvedilol  6.25 mg twice daily with last dose at 0828 on the morning of 10/18.  Following event, patient was transferred CT.  Cardiology met the patient coming out of CT1, at which time his cardiac monitor showed sinus bradycardia with rates in the upper 40s to low 50s bpm with BBB.  Cardiology escorted the patient back up from CT to his room.  At time of cardiology consult, he is hemodynamically stable maintaining sinus rhythm with a bradycardic rate in the 50s bpm with underlying bundle branch block with BP 121/44.  He does continue to note some continued fatigue.  Currently without symptoms of near-syncope or syncope.  No chest pain.    Past Medical History:  Diagnosis Date   Abscess of right arm 03/2023   pt states had absess to right arm after iv was inserted. Pt states he had a wound vac for 2 weeks   Alternating RBBB & LBBB    Ankle swelling 2024   Anticoagulated on apixaban     Aortic atherosclerosis    Arthritis of neck    Bilateral inguinal hernia    Bladder tumor    BPH (benign prostatic hyperplasia)    CAD (coronary artery disease)    a. 08/2017 STEMI/Cath: LM 80ost/d, LAD 100p, LCX 95 ost/p, RCA 60p, 39m, RPDA 80ost; b. 08/2017 CABG x 3: LIMA->LAD, VG->OM, VG->PDA; c. 03/2023 Cardiac Arrest/Cath: Occluded VG->RPDA, patent LIMA->LAD and VG->pLCX. No new native dzs->Med rx. EF 25-35%.   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   CVA (cerebral vascular accident) (HCC) 04/08/2023   a.) brain MRI 04/08/2023: 2 small acute infarctions affecting the cortical brain in the RIGHT frontal region.   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 08/2017 TEE: EF 30-35%, sept/ant/inf HK, apical AK. 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 12/2017 Echo: EF 45-50%, diff HK; c. 09/2022 Echo: EF 55-60%; d. 03/2023 Echo (in settng of PE): EF 40-45%, GrI DD (imaging poor); e. 03/2023 Echo w/ definity : EF 55-60%, nl RV fxn.   Hyperlipidemia LDL goal <70    Hypothyroidism    Ischemic cardiomyopathy    a. 08/2017 TEE: EF 30-35%; b. 12/2017 Echo: EF 45-50%; c. 09/2022 Echo: EF 55-60%.  Long-term use of aspirin  therapy    Nephrolithiasis    PEA (Pulseless electrical activity) (HCC) 04/05/2023   a.) witnessed arrest at [facility] and CPR initiated immediately --> ROSC achieved --> EMS transferred to [facility] with multiple episodes of PEA arrest en route (further CPR required) --> ROSC achieved in ED with further muliple epsides of recurrent PEA arrest lasting secs-mins --> TCP applied and patient taken to cath lab for [facility] and TVP placement   Port-A-Cath in place    Pulmonary embolism (HCC) 04/15/2023   a.) CTA chest 04/15/2023: extensive clot burden with an occlusive clot in the RIGHT upper lobe anterior and posterior segmental arteries, and a non occlusive thrombus in the RIGHT lower lobe main artery and superior and posterior basal segmental arteries --> required thrombectomy   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) LIMA-LAD, SVG-OM, SVG-PDA   Shingles 2018   Status post bilateral cataract extraction    STEMI  (ST elevation myocardial infarction) (HCC) 08/27/2017   a.) LHC 08/27/2017: 80% o-dLM, 100% lPAD, 95% o-pLCx, 60% pRCA, 90% mRCA, 80% oRPDA-RPDA --> unable to cross lesions with wire --> IABP placed --> CVTS consulted and patient transferred to [facility]; b.) s/p 3v CABG 08/27/2017: LIMA-LAD, SVG-OM, SVG-RPDA   Thrombocytopenia    Vertigo     Past Surgical History:  Procedure Laterality Date   CARDIAC CATHETERIZATION     CAROTID PTA/STENT INTERVENTION Right 10/26/2023   Procedure: CAROTID PTA/STENT INTERVENTION;  Surgeon: Marea Selinda RAMAN, MD;  Location: [facility] INVASIVE CV LAB;  Service: Cardiovascular;  Laterality: Right;   CATARACT EXTRACTION W/PHACO Left 07/18/2018   Procedure: CATARACT EXTRACTION PHACO AND INTRAOCULAR LENS PLACEMENT (IOC)  LEFT;  Surgeon: Myrna Adine Anes, MD;  Location: [facility] SURGERY CNTR;  Service: Ophthalmology;  Laterality: Left;   CATARACT EXTRACTION W/PHACO Right 08/14/2018   Procedure: CATARACT EXTRACTION PHACO AND INTRAOCULAR LENS PLACEMENT (IOC)  RIGHT;  Surgeon: Myrna Adine Anes, MD;  Location: [facility] SURGERY [facility];  Service: Ophthalmology;  Laterality: Right;   CORONARY ARTERY BYPASS GRAFT N/A 08/27/2017   Procedure: CORONARY ARTERY BYPASS GRAFTING (CABG) ON PUMP USING LEFT INTERNAL MAMMARY ARTERY AND LEFT GREATER SAPHENOUS VEIN VIA ENDOVEIN HARVEST;  Surgeon: Lucas Dorise POUR, MD;  Location: [facility] OR;  Service: Open Heart Surgery;  Laterality: N/A;   CORONARY/GRAFT ACUTE MI REVASCULARIZATION N/A 08/27/2017   Procedure: Coronary/Graft Acute MI Revascularization;  Surgeon: Darron Deatrice LABOR, MD;  Location: [facility] INVASIVE CV LAB;  Service: Cardiovascular;  Laterality: N/A;   CYSTOSCOPY W/ RETROGRADES Bilateral 12/25/2018   Procedure: CYSTOSCOPY WITH RETROGRADE PYELOGRAM;  Surgeon: Penne Knee, MD;  Location: [facility] ORS;  Service: Urology;  Laterality: Bilateral;   FRACTURE SURGERY Right 1958   arm and left wrist compound fracture, no metal   HERNIA REPAIR Right     inguinial   INGUINAL HERNIA REPAIR Left 12/05/2023   Procedure: REPAIR, HERNIA, INGUINAL, ADULT;  Surgeon: Marinda Jayson KIDD, MD;  Location: [facility] ORS;  Service: General;  Laterality: Left;  open procedure with mesh   INSERTION OF MESH  12/05/2023   Procedure: INSERTION OF MESH;  Surgeon: Marinda Jayson KIDD, MD;  Location: [facility] ORS;  Service: General;;   IR IMAGING GUIDED PORT INSERTION  10/15/2022   LEFT HEART CATH AND CORONARY ANGIOGRAPHY N/A 08/27/2017   Procedure: LEFT HEART CATH AND CORONARY ANGIOGRAPHY;  Surgeon: Darron Deatrice LABOR, MD;  Location: [facility] INVASIVE CV LAB;  Service: Cardiovascular;  Laterality: N/A;   PULMONARY THROMBECTOMY Bilateral 04/18/2023   Procedure: PULMONARY THROMBECTOMY;  Surgeon:  Marea Selinda RAMAN, MD;  Location: [facility] INVASIVE CV LAB;  Service: Cardiovascular;  Laterality: Bilateral;   RIGHT/LEFT HEART CATH AND CORONARY/GRAFT ANGIOGRAPHY N/A 04/05/2023   Procedure: RIGHT/LEFT HEART CATH AND CORONARY/GRAFT ANGIOGRAPHY;  Surgeon: Mady Bruckner, MD;  Location: [facility] INVASIVE CV LAB;  Service: Cardiovascular;  Laterality: N/A;   TEMPORARY PACEMAKER N/A 04/05/2023   Procedure: TEMPORARY PACEMAKER;  Surgeon: Mady Bruckner, MD;  Location: [facility] INVASIVE CV LAB;  Service: Cardiovascular;  Laterality: N/A;   TRANSURETHRAL RESECTION OF BLADDER TUMOR WITH MITOMYCIN -C N/A 12/25/2018   Procedure: TRANSURETHRAL RESECTION OF BLADDER TUMOR WITH Gemcitabine ;  Surgeon: Penne Knee, MD;  Location: [facility] ORS;  Service: Urology;  Laterality: N/A;     Home Meds: Prior to Admission medications   Medication Sig Start Date End Date Taking? Authorizing Provider  oxyCODONE  (ROXICODONE ) 5 MG immediate release tablet Take 1 tablet (5 mg total) by mouth every 8 (eight) hours as needed. 12/08/23 12/07/24  Marinda Jayson KIDD, MD  acetaminophen  (TYLENOL ) 500 MG tablet Take 1,000 mg by mouth in the morning and at bedtime.    [provider]  apixaban  (ELIQUIS ) 5 MG TABS tablet Take 1 tablet (5  mg total) by mouth 2 (two) times daily. 12/07/23   Marinda Jayson KIDD, MD  aspirin  81 MG chewable tablet Place 1 tablet (81 mg total) into feeding tube daily. 12/07/23   Marinda Jayson KIDD, MD  atorvastatin  (LIPITOR ) 80 MG tablet Place 1 tablet (80 mg total) into feeding tube daily. Patient taking differently: Take 80 mg by mouth daily. 09/13/23   Abigail Bernardino HERO, PA-C  Blood Pressure Monitoring (BLOOD PRESSURE KIT) KIT 1 each by Does not apply route in the morning, at noon, and at bedtime. 07/25/23   Towana Small, FNP  carvedilol  (COREG ) 6.25 MG tablet Take 1 tablet (6.25 mg total) by mouth 2 (two) times daily with a meal. 08/11/23   Vivienne Bruckner Ingle, NP  famotidine  (PEPCID ) 20 MG tablet Place 1 tablet (20 mg total) into feeding tube daily. Patient taking differently: Take 20 mg by mouth daily. 09/28/23   Vivienne Bruckner Ingle, NP  levothyroxine  (SYNTHROID ) 100 MCG tablet Take 1 tablet (100 mcg total) by mouth daily before breakfast. 08/23/23   Brahmanday, Govinda R, MD  losartan  (COZAAR ) 25 MG tablet Take 1 tablet (25 mg total) by mouth daily. 08/11/23 12/05/23  Vivienne Bruckner Ingle, NP  Multiple Vitamin (MULTIVITAMIN WITH MINERALS) TABS tablet Take 1 tablet by mouth daily.    [provider]  oxyCODONE  (OXY IR/ROXICODONE ) 5 MG immediate release tablet Take 1 tablet (5 mg total) by mouth every 6 (six) hours as needed for severe pain (pain score 7-10). 12/05/23   Marinda Jayson KIDD, MD    Inpatient Medications: Scheduled Meds:  atorvastatin   80 mg Oral QHS   atropine   0.2 mg Intravenous Once   atropine        famotidine   20 mg Oral Daily   levothyroxine   100 mcg Oral QAC breakfast   multivitamin with minerals  1 tablet Oral Daily   Continuous Infusions:  calcium  gluconate     PRN Meds: acetaminophen  **OR** acetaminophen , atropine , hydrALAZINE , ondansetron  **OR** ondansetron  (ZOFRAN ) IV, senna-docusate  Allergies:  No Known Allergies  Social History:   Social History    Socioeconomic History   Marital status: Single    Spouse name: Not on file   Number of children: Not on file   Years of education: Not on file   Highest education level: Not on file  Occupational History  Occupation: supervision, Research scientist (life sciences)    Comment: retired  Tobacco Use   Smoking status: Former    Current packs/day: 0.00    Types: Cigarettes    Quit date: 2000    Years since quitting: 25.8    Passive exposure: Past   Smokeless tobacco: Former    Types: Snuff  Vaping Use   Vaping status: Never Used  Substance and Sexual Activity   Alcohol use: Not Currently   Drug use: Not Currently   Sexual activity: Not Currently  Other Topics Concern   Not on file  Social History Narrative   Lives alone   Social Drivers of Health   Financial Resource Strain: Low Risk  (11/06/2018)   Overall Financial Resource Strain (CARDIA)    Difficulty of Paying Living Expenses: Not hard at all  Food Insecurity: No Food Insecurity (12/09/2023)   Hunger Vital Sign    Worried About Running Out of Food in the Last Year: Never true    Ran Out of Food in the Last Year: Never true  Transportation Needs: No Transportation Needs (12/09/2023)   PRAPARE - Administrator, Civil Service (Medical): No    Lack of Transportation (Non-Medical): No  Physical Activity: Inactive (11/06/2018)   Exercise Vital Sign    Days of Exercise per Week: 0 days    Minutes of Exercise per Session: 0 min  Stress: No Stress Concern Present (11/06/2018)   [employer] of Occupational Health - Occupational Stress Questionnaire    Feeling of Stress : Not at all  Social Connections: Moderately Integrated (12/09/2023)   Social Connection and Isolation Panel    Frequency of Communication with Friends and Family: Three times a week    Frequency of Social Gatherings with Friends and Family: Once a week    Attends Religious Services: More than 4 times per year    Active Member of [employer] or  Organizations: Yes    Attends Engineer, structural: More than 4 times per year    Marital Status: Never married  Intimate Partner Violence: Not At Risk (12/09/2023)   Humiliation, Afraid, Rape, and Kick questionnaire    Fear of Current or Ex-Partner: No    Emotionally Abused: No    Physically Abused: No    Sexually Abused: No     Family History:   Family History  Problem Relation Age of Onset   Heart failure Mother    Lung disease Father    Stroke Father    Cervical cancer Maternal Grandmother     ROS:  Review of Systems  Constitutional:  Positive for malaise/fatigue. Negative for chills, diaphoresis, fever and weight loss.  HENT:  Negative for congestion.   Eyes:  Negative for discharge and redness.  Respiratory:  Negative for cough, sputum production, shortness of breath and wheezing.   Cardiovascular:  Negative for chest pain, palpitations, orthopnea, claudication, leg swelling and PND.  Gastrointestinal:  Negative for abdominal pain, heartburn, nausea and vomiting.  Musculoskeletal:  Negative for falls and myalgias.  Skin:  Negative for rash.  Neurological:  Positive for dizziness, loss of consciousness and weakness. Negative for tingling, tremors, sensory change, speech change and focal weakness.  Endo/Heme/Allergies:  Does not bruise/bleed easily.  Psychiatric/Behavioral:  Negative for substance abuse. The patient is not nervous/anxious.   All other systems reviewed and are negative.     Physical Exam/Data:   Vitals:   12/10/23 0516 12/10/23 0736 12/10/23 0928 12/10/23 1119  BP: 126/72 131/74 (!) 121/44 ROLLEN)  117/50  Pulse: 67 64 (!) 29 (!) 35  Resp: 20     Temp: 98 F (36.7 C) 98.1 F (36.7 C)    TempSrc:  Oral    SpO2: 100% 99% 100% 99%  Weight:      Height:        Intake/Output Summary (Last 24 hours) at 12/10/2023 1125 Last data filed at 12/10/2023 0516 Gross per 24 hour  Intake 740 ml  Output 350 ml  Net 390 ml   Filed Weights   12/09/23  1212 12/09/23 2159  Weight: 94.3 kg 94.7 kg   Body mass index is 28.32 kg/m.   Physical Exam: General: Well developed, well nourished, in no acute distress. Head: Normocephalic, atraumatic, sclera non-icteric, no xanthomas, nares without discharge.  Neck: Negative for carotid bruits. JVD not elevated. Lungs: Clear bilaterally to auscultation without wheezes, rales, or rhonchi. Breathing is unlabored. Heart: Bradycardic with S1 S2. No murmurs, rubs, or gallops appreciated. Abdomen: Soft, non-tender, non-distended with normoactive bowel sounds. No hepatomegaly. No rebound/guarding. No obvious abdominal masses. Msk:  Strength and tone appear normal for age. Extremities: No clubbing or cyanosis. No edema. Distal pedal pulses are 2+ and equal bilaterally. Neuro: Alert and oriented X 3. No facial asymmetry. No focal deficit. Moves all extremities spontaneously. Psych:  Responds to questions appropriately with a normal affect.   EKG:  The EKG was personally reviewed and demonstrates: sinus rhythm, 75 bpm, trifascicular block with first-degree AV block, left anterior fascicular block, and right bundle branch block Telemetry:  Telemetry was personally reviewed and demonstrates: Sinus rhythm with underlying bundle branch block with intermittent sinus bradycardia, intermittent high-grade AV block, and multiple sinus pauses lasting up to 11.6 seconds  Weights: Filed Weights   12/09/23 1212 12/09/23 2159  Weight: 94.3 kg 94.7 kg    Relevant CV Studies:  Limited echo 04/16/2023: 1. Left ventricular ejection fraction, by estimation, is 55 to 60%. Left  ventricular ejection fraction by 2D MOD biplane is 56.9 %. The left  ventricle has normal function. The left ventricle has no regional wall  motion abnormalities. There is mild left  ventricular hypertrophy.   2. Right ventricular systolic function is normal. The right ventricular  size is normal.  __________  2D echo 04/07/2023: 1. Very  challenging images, definity  not used   2. Left ventricular ejection fraction, by estimation, is 40 to 45 %. The  left ventricle has mildly decreased function. The left ventricle  demonstrates regional wall motion abnormalities (images suggest  anteroseptal, inferior/posterior and apical  hypokinesis, possibly anterior wall hypokinesis). There is mild left  ventricular hypertrophy. Left ventricular diastolic parameters are  consistent with Grade I diastolic dysfunction (impaired relaxation).   3. Right ventricular systolic function was not well visualized. The right  ventricular size is normal.   4. The mitral valve is normal in structure. No evidence of mitral valve  regurgitation. No evidence of mitral stenosis.   5. The aortic valve is normal in structure. There is mild calcification  of the aortic valve. Aortic valve regurgitation is not visualized. Aortic  valve sclerosis/calcification is present, without any evidence of aortic  stenosis. Aortic valve mean  gradient measures 2.0 mmHg.  __________  [facility] 04/05/2023: Conclusions: Severe native coronary artery disease with occluded LMCA and diffusely diseased RCA with up to 95% stenosis. Widely patent LIMA-LAD and SVG-LCx. Occluded SVG-PDA, likely chronic. Severely reduced left ventricular systolic function (LVEF 25-30%) with anterior hypokinesis/akinesis. Mildly to moderately elevated left heart filling pressures (LVEDP 20  mmHg; PCWP 25 mmHg). Moderately to severely elevated right heart filling pressures (mean RA 15 mmHg, RV 40/15 mmHg).  PAPi of 1 suggests an element of RV dysfunction. Successful placement of temporary transvenous pacemaker via the right femoral vein.   Recommendations: No obvious culprit lesion identified for the patient's cardiac arrest, marked bradycardia/complete heart block, cardiogenic shock, and acute on chronic HFrEF.  Continue secondary prevention of CAD. Significant hypoxia noted throughout catheterization  despite aggressive oxygenation with the ventilator.  Question if some other process such as pulmonary embolism or intrinsic lung disease could be contributing.  Recommend empiric heparin  to be given 2 hours after right femoral artery hemostasis was achieved.  Further workup per critical care team. Obtain stat portable chest x-ray when patient arrives in the ICU to confirm location of temporary transvenous pacing wire and pulmonary artery catheter. Continue gentle diuresis as blood pressure allows. Wean epinephrine  infusion. __________  See CV Studies in Epic for more remote imaging   Laboratory Data:  Chemistry Recent Labs  Lab 12/09/23 1313 12/10/23 0526  NA 138 136  K 4.1 3.9  CL 101 100  CO2 27 26  GLUCOSE 111* 105*  BUN 24* 21  CREATININE 1.16 1.13  CALCIUM  8.5* 8.8*  GFRNONAA >60 >60  ANIONGAP 10 10    Recent Labs  Lab 12/09/23 1313  PROT 6.3*  ALBUMIN  3.3*  AST 38  ALT 30  ALKPHOS 64  BILITOT 1.0   Hematology Recent Labs  Lab 12/09/23 1313 12/10/23 0526  WBC 6.7 5.8  RBC 3.23* 3.27*  HGB 10.2* 10.3*  HCT 30.8* 30.6*  MCV 95.4 93.6  MCH 31.6 31.5  MCHC 33.1 33.7  RDW 12.6 12.7  PLT 151 154   Cardiac EnzymesNo results for input(s): TROPONINI in the last 168 hours. No results for input(s): TROPIPOC in the last 168 hours.  BNPNo results for input(s): BNP, PROBNP in the last 168 hours.  DDimer No results for input(s): DDIMER in the last 168 hours.  Radiology/Studies:  CT Angio Abd/Pel W and/or Wo Contrast Result Date: 12/09/2023 IMPRESSION: 1. Sizable left groin hematoma but no extravasation of contrast material to suggest active bleeding. 2. Sizable ulcerated plaque or focal dissection involving the posterior aspect of the lower abdominal aorta on the left side measuring approximately 2.2 cm. 3. No acute abdominal/pelvic findings, mass lesions or adenopathy. 4. Enlarged prostate gland. 5. Aortic atherosclerosis. Aortic Atherosclerosis  (ICD10-I70.0). Electronically Signed   By: MYRTIS Stammer M.D.   On: 12/09/2023 18:02   CT Angio Chest PE W and/or Wo Contrast Result Date: 12/09/2023 IMPRESSION: 1. No embolism to the proximal subsegmental pulmonary artery level. No lung mass, consolidation, pleural effusion or pneumothorax. 2. There is a new 5 x 6 mm ground-glass nodule in the right upper lobe. Consider short-term follow-up examination in 3 months to document resolution versus stability. 3. Multiple other nonacute observations, as described above. Aortic Atherosclerosis (ICD10-I70.0). Electronically Signed   By: Ree Molt M.D.   On: 12/09/2023 15:51    Assessment and Plan:   1.  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: -Previously required venous temp wire in 03/2023 in the setting of PEA arrest in the setting of pulmonary embolism - Currently hemodynamically stable in sinus rhythm with mildly bradycardic rate in the 50s bpm - Hold carvedilol  with last dose of 6.25 mg received at 0828 on 10/18 - Give 1 g of IV calcium  gluconate - Atropine  and  pacer pads to bedside - Currently no indication for venous temp wire, though interventional cardiology has been made aware this may be indicated if he has further pauses or becomes hemodynamically unstable - Transfer to the ICU, may need to add dopamine  if becomes hemodynamically unstable - Obtain TSH and free T4 with most recent TSH noted to be significantly elevated - Potassium and magnesium  normal - EP to evaluate on 10/20 for consideration of pacemaker despite episode occurring while on beta-blocker given underlying alternating LBBB and RBBB  2.  CAD status post CABG without angina: - No symptoms suggestive of angina - High-sensitivity troponin negative x 2 this admission - Recommend resuming aspirin  when felt to be safe from a surgical perspective given recent hernia  repair in the context of left groin hematoma without evidence of active extravasation - Atorvastatin  80 mg - No plans for inpatient ischemic evaluation at this time  3.  HFrEF: - Appears euvolemic and well compensated - Hold carvedilol  as outlined above - Hold losartan  for now - Not requiring active diuresis  4.  History of PEA arrest: - In the setting of pulmonary embolism status post thrombectomy and OAC  5.  Carotid artery disease: - Status post right ICA stenting in 10/2023 - Unlikely contributing to bradycardic episodes currently experienced given timeframe since intervention - Management per vascular surgery  6.  Left groin hematoma/ulcerated plaque versus focal dissection involving the posterior aspect of the lower abdominal aorta: - BP currently stable - Management per vascular surgery  7.  HTN: - Allow for permissive hypertension  8.  Hypothyroidism: - Poorly controlled on most recent outpatient labs - Remains on levothyroxine , may need adjustment - Obtain TSH and free T4  9.  HLD: - LDL 25 in 03/2023 - Atorvastatin  80 mg      For questions or updates, please contact [facility] Please consult [email or web address] for contact info under Cardiology/STEMI.   Signed, Bernardino Bring, PA-C [facility] Pager: [phone] 12/10/2023, 11:25 AM

## 2023-12-10 NOTE — Progress Notes (Signed)
 Patient transferred to ICU at 11:20 following Rapid Response for symptomatic bradycardia.  Report given to Harlene, [place]

## 2023-12-10 NOTE — Consult Note (Signed)
 NAME:  Cory Hess, MRN:  [id number], DOB:  1945-12-26, LOS: 0 ADMISSION DATE:  12/09/2023, CONSULTATION DATE:  12/10/2023 REFERRING MD:  Greig Free, CHIEF COMPLAINT:  Syncopal episode  History of Present Illness:  Cory Hess is a 78 y.o. male with a PMH of Complete heart block, Paroxysmal A Fib on Eliquis , CABG x 3, Cardiomyopathy, Hypertension, CAD, CKD, HFimpEF, STEMI, Cancer of base of tongue and hypothyroidism. He recently had a hernia repair on 10/13 but returned to [facility] on 10/17 following a near syncopal episode at [facility]. Prior to the near syncopal episode, he reported symptoms of angina. In the ED he received a full workup, with a Chest CTA negative for acute PE. CTA Abdomen/Pelvis showed left groin hematoma without extravasation of contrast, suggesting active bleed, along with a sizable ulcerated plaque or focal dissection involving the posterior aspect of the lower abdominal aorta of the left side (~2.2cm). Labs significant for mild stable anemia (Hgb 10.2-10.3). Negative troponins x 2.  On the morning of 10/18, Cory Hess developed intermittent high-grade AV block with multiple prolonged pauses with associated near syncope. At the time he did not require atropine , CPR or transcutaneous pacing. Following the acute event, the patient was transferred to CT. It is noted that he was taking Carvedilol  6.25mg  BID prior to admission, with his most recent dose at 0828 on 10/18. Following CT imaging, Cardiology met the patient, and at the time his cardiac monitor displayed sinus bradycardia (Rates 40-50sbpm) with bundle branch block. During initial cardiology consult, he was hemodynamically stable in NSR with a bradycardic rate in the 50s with an underlying BBB, BP 121/44.  After the initial visit from Cardiology, he later developed Complete Heart Block, followed by absent ventricular response and syncope. It was noted this episode was soon after receiving his morning dose of Carvedilol . Rate at this  time was 20-30s. A rapid response was called and the patient was then transferred to the ICU for consultation. Dr. Florencio came to see the patient and is planning to place a temporary PM. Orders per Cardiology were placed, including Atropine  and Calcium  Gluconate. Once in the ICU the patient was started on dopamine  until the temp wire is placed.   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 HR 20-30bpm, pt transferred to the ICU. Started on dopamine  with orders for atropine  and calcium  gluconate placed. Dopamine  requirements increased to 10mcg/kg/min with HR in the 40s-50s. Cardiology planning to place a temporary pacemaker today.  Interim History / Subjective:  See above listed under Significant Hospital Events.  Objective    Blood pressure (!) 99/42, pulse (!) 31, temperature 98.1 F (36.7 C), temperature source Oral, resp. rate 16, height 6' (1.829 m), weight 96.4 kg, SpO2 95%.        Intake/Output Summary (Last 24 hours) at 12/10/2023 1138 Last data filed at 12/10/2023 0516 Gross per 24 hour  Intake 740 ml  Output 350 ml  Net 390 ml   Filed Weights   12/09/23 1212 12/09/23  2159 12/10/23 1130  Weight: 94.3 kg 94.7 kg 96.4 kg    Examination: General: ill-appearing, lying in bed in mild distress following syncopal episode HENT: atraumatic, normocephalic, no JVD Lungs: clear and  equal breath sounds bilaterally, unlabored, no wheezing/rales/rhonci Cardiovascular: sinus bradycardia with CHB, S1 S2. No m/r/g Abdomen: soft, non-tender, non-distended, bowel sounds x 4, no rebound tenderness Extremities: warm/intact, no edema, radial pulses 2+, distal 1+ Neuro: A&O x 3, no focal neuro deficits, follows commands, PERRL GU: deferred  Resolved problem list   Assessment and Plan   #Syncope #Symptomatic Bradycardia #Complete Heart Block #Alternating LBBB/RBBB Hx: CAD S/P CABG x 3, HFimpEF secondary to ICM, PE with PEA 2/25, alternating BBB, CAD S/P Right ICA stent 10/26/2023, Hypertension, Hyperlipidemia 12/10/23 CTA Neck w/wo Contrast: mixed atherosclerotic plaque at right carotid bifurcation, 55% stenosis of right ICA origin. Moderate stenosis of right vertebral artery origin. 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 - Continuous cardiac monitoring - Vasopressors to maintain MAP goal of >65 - Currently on dopamine  10mcgs/kg/min to maintain HR in the 40-50s - 12 Lead ECG - Atropine  and Calcium  Gluconate as indicated per Cardiology - Cardiology consulted; appreciate input - Temporary Pacemaker placement today - Lyme panel pending - Troponin pending - Home meds: Carvedilol  6.25 po BID, Losartan  25mg  every day - Antihypertensives: Hydralazine  5mg  IV Q6H prn for SBP > 160  Pulmonary - Supplemental oxygen to maintain O2 sats >92% ~ not currently requiring - Remains on room air - Continue incentive spirometry  Renal - I/Os - Trend BMP and renal function - Creatinine stable at 1.13 - Avoid nephrotoxins as able - Electrolyte replacement protocol as indicated  #Acute Blood Loss Anemia secondary to Left Groin Hematoma - Trend CBC - Monitor H&H; Hgb 10.3 (Baseline ~11-12) - Transfuse if Hgb <7 - Monitor for s/sx of bleeding - Holding home Eliquis  per hospitalist - General surgery following - Vascular following  #Hypothyroidism - TSH: 42.899, Free  T4: 0.60 - Continue Levothyroxine  - ICU hypo/hyperglycemia protocol   Labs   CBC: Recent Labs  Lab 12/09/23 1313 12/10/23 0526  WBC 6.7 5.8  NEUTROABS 5.0  --   HGB 10.2* 10.3*  HCT 30.8* 30.6*  MCV 95.4 93.6  PLT 151 154    Basic Metabolic Panel: Recent Labs  Lab 12/09/23 1313 12/10/23 0526  NA 138 136  K 4.1 3.9  CL 101 100  CO2 27 26  GLUCOSE 111* 105*  BUN 24* 21  CREATININE 1.16 1.13  CALCIUM  8.5* 8.8*  MG 2.1  --    GFR: Estimated Creatinine Clearance: 65.9 mL/min (by C-G formula based on SCr of 1.13 mg/dL). Recent Labs  Lab 12/09/23 1313 12/10/23 0526  WBC 6.7 5.8    Liver Function Tests: Recent Labs  Lab 12/09/23 1313  AST 38  ALT 30  ALKPHOS 64  BILITOT 1.0  PROT 6.3*  ALBUMIN  3.3*   No results for input(s): LIPASE, AMYLASE in the last 168 hours. No results for input(s): AMMONIA in the last 168 hours.  ABG    Component Value Date/Time   PHART 7.38 04/15/2023 0515   PCO2ART 40 04/15/2023 0515   PO2ART 175 (H) 04/15/2023 0515   HCO3 23.7 04/15/2023 0515   TCO2 33 (H) 04/05/2023 1459   ACIDBASEDEF 1.3 04/15/2023 0515   O2SAT 100 04/15/2023 0515     Coagulation Profile: No results for input(s): INR, PROTIME in the last 168 hours.  Cardiac Enzymes: No results for input(s): CKTOTAL, CKMB, CKMBINDEX, TROPONINI in  the last 168 hours.  HbA1C: Hgb A1c MFr Bld  Date/Time Value Ref Range Status  07/06/2023 03:31 PM 5.2 4.8 - 5.6 % Final    Comment:             Prediabetes: 5.7 - 6.4          Diabetes: >6.4          Glycemic control for adults with diabetes: <7.0   04/05/2023 07:38 PM 5.9 (H) 4.8 - 5.6 % Final    Comment:    (NOTE) Pre diabetes:          5.7%-6.4%  Diabetes:              >6.4%  Glycemic control for   <7.0% adults with diabetes     CBG: Recent Labs  Lab 12/10/23 1125  GLUCAP 98    Review of Systems:   Pt endorses fatigue, loss of consciousness and weakness. Denies chest pain or  shortness of breath. All other systems negative.  Past Medical History:  He,  has a past medical history of Abscess of right arm (03/2023), Alternating RBBB & LBBB, Ankle swelling (2024), Anticoagulated on apixaban , Aortic atherosclerosis, Arthritis of neck, Bilateral inguinal hernia, Bladder tumor, BPH (benign prostatic hyperplasia), CAD (coronary artery disease), Cancer of base of tongue (HCC) (09/22/2022), Cardiogenic shock (HCC) (04/05/2023), Cardiomegaly, Carotid stenosis, Cerebral microvascular disease, Cholelithiasis, CKD (chronic kidney disease), stage [name] (HCC), Complete heart block (HCC) (04/05/2023), CVA (cerebral vascular accident) (HCC) (04/08/2023), Essential hypertension, Former smoker, GERD (gastroesophageal reflux disease), Hepatic steatosis, HFimpEF (heart failure with improved ejection fraction) (HCC), Hyperlipidemia LDL goal <70, Hypothyroidism, Ischemic cardiomyopathy, Long-term use of aspirin  therapy, Nephrolithiasis, PEA (Pulseless electrical activity) (HCC) (04/05/2023), Port-A-Cath in place, Pulmonary embolism (HCC) (04/15/2023), S/P CABG x 3 (08/27/2017), Shingles (2018), Status post bilateral cataract extraction, STEMI (ST elevation myocardial infarction) (HCC) (08/27/2017), Thrombocytopenia, and Vertigo.   Surgical History:   Past Surgical History:  Procedure Laterality Date   CARDIAC CATHETERIZATION     CAROTID PTA/STENT INTERVENTION Right 10/26/2023   Procedure: CAROTID PTA/STENT INTERVENTION;  Surgeon: Marea Selinda RAMAN, MD;  Location: [facility];  Service: Cardiovascular;  Laterality: Right;   CATARACT EXTRACTION W/PHACO Left 07/18/2018   Procedure: CATARACT EXTRACTION PHACO AND INTRAOCULAR LENS PLACEMENT (IOC)  LEFT;  Surgeon: Myrna Adine Anes, MD;  Location: [facility];  Service: Ophthalmology;  Laterality: Left;   CATARACT EXTRACTION W/PHACO Right 08/14/2018   Procedure: CATARACT EXTRACTION PHACO AND INTRAOCULAR LENS PLACEMENT (IOC)  RIGHT;  Surgeon:  Myrna Adine Anes, MD;  Location: [facility];  Service: Ophthalmology;  Laterality: Right;   CORONARY ARTERY BYPASS GRAFT N/A 08/27/2017   Procedure: CORONARY ARTERY BYPASS GRAFTING (CABG) ON PUMP USING LEFT INTERNAL MAMMARY ARTERY AND LEFT GREATER SAPHENOUS VEIN VIA ENDOVEIN HARVEST;  Surgeon: Lucas Dorise POUR, MD;  Location: [facility] OR;  Service: Open Heart Surgery;  Laterality: N/A;   CORONARY/GRAFT ACUTE MI REVASCULARIZATION N/A 08/27/2017   Procedure: Coronary/Graft Acute MI Revascularization;  Surgeon: Darron Deatrice LABOR, MD;  Location: [facility];  Service: Cardiovascular;  Laterality: N/A;   CYSTOSCOPY W/ RETROGRADES Bilateral 12/25/2018   Procedure: CYSTOSCOPY WITH RETROGRADE PYELOGRAM;  Surgeon: Penne Knee, MD;  Location: [facility] OR[facility];  Service: Urology;  Laterality: Bilateral;   FRACTURE SURGERY Right 1958   arm and left wrist compound fracture, no metal   HERNIA REPAIR Right    inguinial   INGUINAL HERNIA REPAIR Left 12/05/2023   Procedure: REPAIR, HERNIA, INGUINAL, ADULT;  Surgeon: Marinda Savant  O, MD;  Location: [facility];  Service: General;  Laterality: Left;  open procedure with mesh   INSERTION OF MESH  12/05/2023   Procedure: INSERTION OF MESH;  Surgeon: Marinda Jayson KIDD, MD;  Location: [facility] OR[facility];  Service: General;;   IR IMAGING GUIDED PORT INSERTION  10/15/2022   LEFT HEART CATH AND CORONARY ANGIOGRAPHY N/A 08/27/2017   Procedure: LEFT HEART CATH AND CORONARY ANGIOGRAPHY;  Surgeon: Darron Deatrice LABOR, MD;  Location: [facility];  Service: Cardiovascular;  Laterality: N/A;   PULMONARY THROMBECTOMY Bilateral 04/18/2023   Procedure: PULMONARY THROMBECTOMY;  Surgeon: Marea Selinda RAMAN, MD;  Location: [facility];  Service: Cardiovascular;  Laterality: Bilateral;   RIGHT/LEFT HEART CATH AND CORONARY/GRAFT ANGIOGRAPHY N/A 04/05/2023   Procedure: RIGHT/LEFT HEART CATH AND CORONARY/GRAFT ANGIOGRAPHY;  Surgeon: Mady Bruckner, MD;  Location: [facility]  [facility];  Service: Cardiovascular;  Laterality: N/A;   TEMPORARY PACEMAKER N/A 04/05/2023   Procedure: TEMPORARY PACEMAKER;  Surgeon: Mady Bruckner, MD;  Location: [facility];  Service: Cardiovascular;  Laterality: N/A;   TRANSURETHRAL RESECTION OF BLADDER TUMOR WITH MITOMYCIN -C N/A 12/25/2018   Procedure: TRANSURETHRAL RESECTION OF BLADDER TUMOR WITH Gemcitabine ;  Surgeon: Penne Knee, MD;  Location: [facility];  Service: Urology;  Laterality: N/A;     Social History:   reports that he quit smoking about 25 years ago. His smoking use included cigarettes. He has been exposed to tobacco smoke. He has quit using smokeless tobacco.  His smokeless tobacco use included snuff. He reports that he does not currently use alcohol. He reports that he does not currently use drugs.   Family History:  His family history includes Cervical cancer in his maternal grandmother; Heart failure in his mother; Lung disease in his father; Stroke in his father.   Allergies No Known Allergies   Home Medications  Prior to Admission medications   Medication Sig Start Date End Date Taking? Authorizing Provider  oxyCODONE  (ROXICODONE ) 5 MG immediate release tablet Take 1 tablet (5 mg total) by mouth every 8 (eight) hours as needed. 12/08/23 12/07/24  Marinda Jayson KIDD, MD  acetaminophen  (TYLENOL ) 500 MG tablet Take 1,000 mg by mouth in the morning and at bedtime.    [provider]  apixaban  (ELIQUIS ) 5 MG TABS tablet Take 1 tablet (5 mg total) by mouth 2 (two) times daily. 12/07/23   Marinda Jayson KIDD, MD  aspirin  81 MG chewable tablet Place 1 tablet (81 mg total) into feeding tube daily. 12/07/23   Marinda Jayson KIDD, MD  atorvastatin  (LIPITOR ) 80 MG tablet Place 1 tablet (80 mg total) into feeding tube daily. Patient taking differently: Take 80 mg by mouth daily. 09/13/23   Abigail Bernardino HERO, PA-C  Blood Pressure Monitoring (BLOOD PRESSURE KIT) KIT 1 each by Does not apply route in the morning, at noon, and  at bedtime. 07/25/23   Butler, Kristina, FNP  carvedilol  (COREG ) 6.25 MG tablet Take 1 tablet (6.25 mg total) by mouth 2 (two) times daily with a meal. 08/11/23   Vivienne Bruckner Ingle, NP  famotidine  (PEPCID ) 20 MG tablet Place 1 tablet (20 mg total) into feeding tube daily. Patient taking differently: Take 20 mg by mouth daily. 09/28/23   Vivienne Bruckner Ingle, NP  levothyroxine  (SYNTHROID ) 100 MCG tablet Take 1 tablet (100 mcg total) by mouth daily before breakfast. 08/23/23   Brahmanday, Govinda R, MD  losartan  (COZAAR ) 25 MG tablet Take 1 tablet (25 mg total) by mouth daily. 08/11/23 12/05/23  Vivienne,  Lonni Ingle, NP  Multiple Vitamin (MULTIVITAMIN WITH MINERALS) TABS tablet Take 1 tablet by mouth daily.    [provider]  oxyCODONE  (OXY IR/ROXICODONE ) 5 MG immediate release tablet Take 1 tablet (5 mg total) by mouth every 6 (six) hours as needed for severe pain (pain score 7-10). 12/05/23   Marinda Jayson KIDD, MD     Critical care time: 55 minutes     Robet Kim, PA-C [facility] PCCM Team Contact Info: [phone]

## 2023-12-10 NOTE — Progress Notes (Signed)
   12/10/23 0900  Spiritual Encounters  Type of Visit Initial  Care provided to: Patient  Referral source Code page  Reason for visit Code  OnCall Visit Yes   Chaplain responded to Medical Alert Rapid Response Code. Patient tended by care team. No family present.

## 2023-12-10 NOTE — CV Procedure (Signed)
 Temporary pacemaker  Symptomatic bradycardia Patient referred for temporary pacer because of symptomatic bradycardia currently on beta-blocker Patient transferred to ICU  Patient brought to cardiac Cath Lab 6 Fr  Venous sheath placed in right femoral vein under ultrasound guidance after 10 cc 1% lidocaine  used to anesthetize area  We floated a 6 French pacer wire into the RV and were able to gain capture without difficulty  We set rate initially at 100 since his intrinsic rate of time was 95 and patient was started on asynchronous and lost capture at 2 so was set at 3.5.  Patient distance was about 75 cm  We sent the patient a rate at 60 as backup in case his rate drops  Hopefully pacer is to be removed within 24 to 48 hours if his rate improves  Patient tolerated procedure well  No complications  Patient transferred back to ICU  Cara Lovelace MD Interventional cardiology

## 2023-12-10 NOTE — Progress Notes (Addendum)
 Progress Note   Patient: Cory Hess FMW:[id number] DOB: [birth year] DOA: 12/09/2023     0 DOS: the patient was seen and examined on 12/10/2023   Brief hospital course:  HPI [name] is a 78 year old male with hypothyroid, hypertension, paroxysmal atrial fibrillation on Eliquis , recent left inguinal hernia repair 10/13, who presents ED for chief concerns of near syncope while sitting at [facility]. Vitals in the ED showed t of 98, rr 17, hr 93, blood pressure 152/85, SpO2 97% on room air. Serum sodium is 138, potassium 4.1, chloride 101, bicarb 27, BUN 24, serum creatinine 1.16, eGFR > 60, nonfasting blood glucose 111, WBC 6.7, hemoglobin 10.2, platelets of 151. He reports he had breakfast at 6 AM on the same day of presentation.  At breakfast, he had grits with sugar and several many muffins. At around 9:30 AM, he went to [employer] to meet with some old time friends.  They hung out at [facility].  He took 1 dose of oxycodone  that was prescribed to him due to the hernia repair surgery.  He did not eat with this medication. At approximately 1130, while at [facility], he developed dizziness and he felt like his eyes rolling in the back of his head and extreme weakness.  He denies loss of consciousness and states he was aware the entire time.  He reports this is never happened before.  He reports this feels different from other episodes of vertigo that he has been diagnosed.    Assessment and Plan:  Acute symptomatic bradycardia with near syncopal episode This morning patient had near syncopal episode while sitting at the edge of the bed and was noted to have heart rate dropping into the low 30s.  Rapid response was called A dose of atropine  ordered Cardiologist consulted stat and case discussed with Dr. Raford I responded to rapid response together with neurosurgeon at bedside. Vascular surgery asked for CT neck to be obtained due to recent carotid stent and subsequently order  placed Patient still on telemetry and heart rate noted to have returned to high 50s to 60s. When patient got back to the room was noted to have had another drop in heart rate and subsequently transferred to the ICU Plan of care discussed with intensivist and dopamine  drip initiated and signout given to ICU for further management as he is now requiring drip management. We will avoid AV nodal blocking agent  Acute blood loss anemia Sizable left groin hematoma without extravasation of contrast material to suggest active bleeding Sizable ulcerated plaque or focal dissection involving posterior aspect of the lower abdominal aorta on the left side measuring approximately 2.2 cm Hold home Eliquis  We will monitor H&H Vascular surgery on board and case discussed   Coronary artery disease Atorvastatin  80 mg nightly   Acquired hypothyroidism Continue levothyroxine  100 mcg daily before breakfast   Essential hypertension Monitor blood pressure closely Holding all antihypertensives at this time given relative hypotension Carvedilol  discontinued   Chart reviewed.    DVT prophylaxis: SCD Code Status: DNR/DNI, ACP reviewed Diet: Healthy diet Family Communication: I called patient's sister over the phone and case discussed Disposition Plan: Pending clinical course Consults called: Vascular, general surgery   Subjective:  This morning patient had near syncopal episode while sitting at the edge of the bed and was noted to have heart rate dropping into the low 30s.  Rapid response was called He did admit to having dizziness and lightheadedness Denied chest pain cough nausea vomiting  Physical Exam:  Constitutional: appears age-appropriate, weak, frail Eyes: PERRL, lids and conjunctivae normal Neck: normal, supple, no masses, no thyromegaly Respiratory: clear to auscultation bilaterally, no wheezing, no crackles. Normal respiratory effort. No accessory muscle use.  Cardiovascular: S1-S2  present, bradycardic Abdomen: no tenderness, no masses palpated, no hepatosplenomegaly. Bowel sounds positive.  Musculoskeletal: no clubbing / cyanosis. No joint deformity upper and lower extremities. Good ROM, no contractures, no atrophy. Normal muscle tone.  Skin: Left groin swelling with firmness and extensive ecchymosis extending to the penis circumferentially, left groin tenderness Neurologic: Sensation intact. Strength 5/5 in all 4.  Psychiatric: Normal judgment and insight. Alert and oriented x 3. Normal mood.   Vitals:   12/10/23 1128 12/10/23 1130 12/10/23 1145 12/10/23 1152  BP: (!) 100/59 (!) 99/42 (!) 145/51 (!) 143/55  Pulse: (!) 33 (!) 31 (!) 51 (!) 110  Resp: 13 16 (!) 21 (!) 24  Temp:      TempSrc:      SpO2: 97% 95% 94% 95%  Weight:  96.4 kg    Height:        Data Reviewed: CT scan reviewed    Latest Ref Rng & Units 12/10/2023    5:26 AM 12/09/2023    1:13 PM 11/29/2023    2:11 PM  CBC  WBC 4.0 - 10.5 K/uL 5.8  6.7  5.7   Hemoglobin 13.0 - 17.0 g/dL 89.6  89.7  88.4   Hematocrit 39.0 - 52.0 % 30.6  30.8  35.2   Platelets 150 - 400 K/uL 154  151  155        Latest Ref Rng & Units 12/10/2023    5:26 AM 12/09/2023    1:13 PM 11/29/2023    2:11 PM  BMP  Glucose 70 - 99 mg/dL 894  888  87   BUN 8 - 23 mg/dL 21  24  20    Creatinine 0.61 - 1.24 mg/dL 8.86  8.83  8.87   Sodium 135 - 145 mmol/L 136  138  139   Potassium 3.5 - 5.1 mmol/L 3.9  4.1  4.0   Chloride 98 - 111 mmol/L 100  101  104   CO2 22 - 32 mmol/L 26  27  27    Calcium  8.9 - 10.3 mg/dL 8.8  8.5  8.7    I spent 60 minutes of critical care time taking care of this patient responding to rapid response, as well as discussing plan of care with intensivist, cardiologist as well as vascular surgeon.  Author: Drue ONEIDA Potter, MD 12/10/2023 1:01 PM  For on call review [email or web address].

## 2023-12-10 NOTE — Consult Note (Signed)
 [facility] Vascular Consult Note  MRN : [id number]  Cory Hess is a 78 y.o. (10/10/1945) male who presents with chief complaint of  Chief Complaint  Patient presents with   Near Syncopal Episode  .  History of Present Illness: Patient known to service. H/O Right Carotid artery stenting 10/26/2023. For severe asymptomatic carotid stenosis. PMH PEA arrest and PE on Eliquis /ASA. Now s/p LEFT inguinal hernia repair on 12/05/2023- presented yesterday to ED with near syncopal episode and large left groin hematoma. CT obtained- no active bleeding in groin- large hemaoma. Incidental finding of focal distal aortic ulceration/dissection- without flow limitation or hematoma. Patient complains of left groin discomfort over hematoma and bruising. Denies back, abdominal or leg pain.  Current Facility-Administered Medications  Medication Dose Route Frequency Provider Last Rate Last Admin   acetaminophen  (TYLENOL ) tablet 650 mg  650 mg Oral Q6H PRN Cox, Amy N, DO       Or   acetaminophen  (TYLENOL ) suppository 650 mg  650 mg Rectal Q6H PRN Cox, Amy N, DO       atorvastatin  (LIPITOR ) tablet 80 mg  80 mg Oral QHS Cox, Amy N, DO   80 mg at 12/09/23 2218   carvedilol  (COREG ) tablet 6.25 mg  6.25 mg Oral BID WC Cox, Amy N, DO   6.25 mg at 12/10/23 9171   famotidine  (PEPCID ) tablet 20 mg  20 mg Oral Daily Cox, Amy N, DO   20 mg at 12/10/23 9171   hydrALAZINE  (APRESOLINE ) injection 5 mg  5 mg Intravenous Q6H PRN Cox, Amy N, DO       levothyroxine  (SYNTHROID ) tablet 100 mcg  100 mcg Oral QAC breakfast Cox, Amy N, DO   100 mcg at 12/10/23 9484   losartan  (COZAAR ) tablet 25 mg  25 mg Oral Daily Cox, Amy N, DO   25 mg at 12/10/23 9170   multivitamin with minerals tablet 1 tablet  1 tablet Oral Daily Cox, Amy N, DO   1 tablet at 12/10/23 9171   ondansetron  (ZOFRAN ) tablet 4 mg  4 mg Oral Q6H PRN Cox, Amy N, DO       Or   ondansetron  (ZOFRAN ) injection 4 mg  4 mg Intravenous Q6H PRN Cox,  Amy N, DO       senna-docusate (Senokot-S) tablet 1 tablet  1 tablet Oral QHS PRN Cox, Amy N, DO       Facility-Administered Medications Ordered in Other Encounters  Medication Dose Route Frequency Provider Last Rate Last Admin   Heparin  (Porcine) in NaCl 1000-0.9 UT/500ML-% SOLN    PRN Dew, Jason S, MD   1,000 mL at 10/26/23 0834   Heparin  (Porcine) in NaCl 2000-0.9 UNIT/L-% SOLN    PRN Dew, Jason S, MD   1,000 mL at 10/26/23 0836   iodixanol  (VISIPAQUE ) 320 MG/ML injection    PRN Marea Selinda RAMAN, MD   65 mL at 10/26/23 9164   lidocaine -EPINEPHrine  (PF) (XYLOCAINE -EPINEPHrine ) 1 %-1:200000 (PF) injection    PRN Marea Selinda RAMAN, MD   10 mL at 10/26/23 9164    Past Medical History:  Diagnosis Date   Abscess of right arm 03/2023   pt states had absess to right arm after iv was inserted. Pt states he had a wound vac for 2 weeks   Alternating RBBB & LBBB    Ankle swelling 2024   Anticoagulated on apixaban     Aortic atherosclerosis    Arthritis of neck    Bilateral inguinal hernia  Bladder tumor    BPH (benign prostatic hyperplasia)    CAD (coronary artery disease)    a. 08/2017 STEMI/Cath: LM 80ost/d, LAD 100p, LCX 95 ost/p, RCA 60p, 31m, RPDA 80ost; b. 08/2017 CABG x 3: LIMA->LAD, VG->OM, VG->PDA; c. 03/2023 Cardiac Arrest/Cath: Occluded VG->RPDA, patent LIMA->LAD and VG->pLCX. No new native dzs->Med rx. EF 25-35%.   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   CVA (cerebral vascular accident) (HCC) 04/08/2023   a.) brain MRI 04/08/2023: 2 small acute  infarctions affecting the cortical brain in the RIGHT frontal region.   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 08/2017 TEE: EF 30-35%, sept/ant/inf HK, apical AK. 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 12/2017 Echo: EF 45-50%, diff HK; c. 09/2022 Echo: EF 55-60%; d. 03/2023 Echo (in settng of PE): EF 40-45%, GrI DD (imaging poor); e. 03/2023 Echo w/ definity : EF 55-60%, nl RV fxn.   Hyperlipidemia LDL goal <70    Hypothyroidism    Ischemic cardiomyopathy    a. 08/2017 TEE: EF 30-35%; b. 12/2017 Echo: EF 45-50%; c. 09/2022 Echo: EF 55-60%.   Long-term use of aspirin  therapy    Nephrolithiasis    PEA (Pulseless electrical activity) (HCC) 04/05/2023   a.) witnessed arrest at [facility] and CPR initiated immediately --> ROSC achieved --> EMS transferred to [facility] with multiple episodes of PEA arrest en route (further CPR required) --> ROSC achieved in ED with further muliple epsides of recurrent PEA arrest lasting secs-mins --> TCP applied and patient taken to cath lab for [facility] and TVP placement   Port-A-Cath in place    Pulmonary embolism (HCC) 04/15/2023   a.) CTA chest 04/15/2023: extensive clot burden with an occlusive clot in the RIGHT upper lobe anterior and posterior segmental arteries, and a non occlusive thrombus in the RIGHT lower lobe main artery and superior and posterior basal segmental arteries --> required thrombectomy   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) LIMA-LAD, SVG-OM, SVG-PDA   Shingles 2018   Status post bilateral cataract extraction    STEMI (ST elevation myocardial infarction) (HCC) 08/27/2017   a.) LHC 08/27/2017: 80% o-dLM, 100% lPAD, 95% o-pLCx, 60% pRCA, 90% mRCA, 80% oRPDA-RPDA --> unable to cross lesions with wire --> IABP placed --> CVTS consulted and patient transferred to [facility]; b.) s/p 3v CABG 08/27/2017: LIMA-LAD, SVG-OM, SVG-RPDA   Thrombocytopenia     Vertigo     Past Surgical History:  Procedure Laterality Date   CARDIAC CATHETERIZATION     CAROTID PTA/STENT INTERVENTION Right 10/26/2023   Procedure: CAROTID PTA/STENT INTERVENTION;  Surgeon: Marea Selinda RAMAN, MD;  Location: [facility] IN[facility];  Service: Cardiovascular;  Laterality: Right;   CATARACT EXTRACTION W/PHACO Left 07/18/2018   Procedure: CATARACT EXTRACTION PHACO AND INTRAOCULAR LENS PLACEMENT (IOC)  LEFT;  Surgeon: Myrna Adine Anes, MD;  Location:  [facility];  Service: Ophthalmology;  Laterality: Left;   CATARACT EXTRACTION W/PHACO Right 08/14/2018   Procedure: CATARACT EXTRACTION PHACO AND INTRAOCULAR LENS PLACEMENT (IOC)  RIGHT;  Surgeon: Myrna Adine Anes, MD;  Location: [facility];  Service: Ophthalmology;  Laterality: Right;   CORONARY ARTERY BYPASS GRAFT N/A 08/27/2017   Procedure: CORONARY ARTERY BYPASS GRAFTING (CABG) ON PUMP USING LEFT INTERNAL MAMMARY ARTERY AND LEFT GREATER SAPHENOUS VEIN VIA ENDOVEIN HARVEST;  Surgeon: Lucas Dorise POUR, MD;  Location: [facility];  Service: Open Heart Surgery;  Laterality: N/A;   CORONARY/GRAFT ACUTE MI REVASCULARIZATION N/A 08/27/2017   Procedure: Coronary/Graft Acute MI Revascularization;  Surgeon: Darron Deatrice LABOR, MD;  Location: [facility];  Service: Cardiovascular;  Laterality: N/A;   CYSTOSCOPY W/ RETROGRADES Bilateral 12/25/2018   Procedure: CYSTOSCOPY WITH RETROGRADE PYELOGRAM;  Surgeon: Penne Knee, MD;  Location: [facility];  Service: Urology;  Laterality: Bilateral;   FRACTURE SURGERY Right 1958   arm and left wrist compound fracture, no metal   HERNIA REPAIR Right    inguinial   INGUINAL HERNIA REPAIR Left 12/05/2023   Procedure: REPAIR, HERNIA, INGUINAL, ADULT;  Surgeon: Marinda Jayson KIDD, MD;  Location: [facility];  Service: General;  Laterality: Left;  open procedure with mesh   INSERTION OF MESH  12/05/2023   Procedure: INSERTION OF MESH;  Surgeon: Marinda Jayson KIDD, MD;  Location: [facility];   Service: General;;   IR IMAGING GUIDED PORT INSERTION  10/15/2022   LEFT HEART CATH AND CORONARY ANGIOGRAPHY N/A 08/27/2017   Procedure: LEFT HEART CATH AND CORONARY ANGIOGRAPHY;  Surgeon: Darron Deatrice LABOR, MD;  Location: [facility];  Service: Cardiovascular;  Laterality: N/A;   PULMONARY THROMBECTOMY Bilateral 04/18/2023   Procedure: PULMONARY THROMBECTOMY;  Surgeon: Marea Selinda RAMAN, MD;  Location: [facility];  Service: Cardiovascular;  Laterality: Bilateral;   RIGHT/LEFT HEART CATH AND CORONARY/GRAFT ANGIOGRAPHY N/A 04/05/2023   Procedure: RIGHT/LEFT HEART CATH AND CORONARY/GRAFT ANGIOGRAPHY;  Surgeon: Mady Bruckner, MD;  Location: [facility];  Service: Cardiovascular;  Laterality: N/A;   TEMPORARY PACEMAKER N/A 04/05/2023   Procedure: TEMPORARY PACEMAKER;  Surgeon: Mady Bruckner, MD;  Location: [facility];  Service: Cardiovascular;  Laterality: N/A;   TRANSURETHRAL RESECTION OF BLADDER TUMOR WITH MITOMYCIN -C N/A 12/25/2018   Procedure: TRANSURETHRAL RESECTION OF BLADDER TUMOR WITH Gemcitabine ;  Surgeon: Penne Knee, MD;  Location: [facility];  Service: Urology;  Laterality: N/A;    Social History Social History   Tobacco Use   Smoking status: Former    Current packs/day: 0.00    Types: Cigarettes    Quit date: 2000    Years since quitting: 25.8    Passive exposure: Past   Smokeless tobacco: Former    Types: Snuff  Vaping Use   Vaping status: Never Used  Substance Use Topics   Alcohol use: Not Currently   Drug use: Not Currently    Family History Family History  Problem Relation Age of Onset   Heart failure Mother    Lung disease Father    Stroke Father    Cervical cancer Maternal Grandmother     No Known Allergies   REVIEW OF SYSTEMS (Negative unless checked)  Constitutional: [] Weight loss  [] Fever  [] Chills Cardiac: [] Chest pain   [] Chest pressure   [] Palpitations   [] Shortness of breath when laying flat   [] Shortness  of breath at rest   [] Shortness of breath with exertion. Vascular:  [] Pain in legs with walking   []   Pain in legs at rest   [] Pain in legs when laying flat   [] Claudication   [] Pain in feet when walking  [] Pain in feet at rest  [] Pain in feet when laying flat   [] History of DVT   [] Phlebitis   [] Swelling in legs   [] Varicose veins   [] Non-healing ulcers Pulmonary:   [] Uses home oxygen   [] Productive cough   [] Hemoptysis   [] Wheeze  [] COPD   [] Asthma Neurologic:  [] Dizziness  [] Blackouts   [] Seizures   [] History of stroke   [] History of TIA  [] Aphasia   [] Temporary blindness   [] Dysphagia   [] Weakness or numbness in arms   [] Weakness or numbness in legs Musculoskeletal:  [] Arthritis   [] Joint swelling   [] Joint pain   [] Low back pain Hematologic:  [] Easy bruising  [] Easy bleeding   [] Hypercoagulable state   [] Anemic  [] Hepatitis Gastrointestinal:  [] Blood in stool   [] Vomiting blood  [] Gastroesophageal reflux/heartburn   [] Difficulty swallowing. Genitourinary:  [] Chronic kidney disease   [] Difficult urination  [] Frequent urination  [] Burning with urination   [] Blood in urine Skin:  [] Rashes   [] Ulcers   [] Wounds Psychological:  [] History of anxiety   []  History of major depression.  Physical Examination  Vitals:   12/09/23 2159 12/10/23 0010 12/10/23 0516 12/10/23 0736  BP: 135/80 119/68 126/72 131/74  Pulse: 72 83 67 64  Resp: 18 18 20    Temp: 98.3 F (36.8 C) 98.2 F (36.8 C) 98 F (36.7 C) 98.1 F (36.7 C)  TempSrc:    Oral  SpO2: 100% 97% 100% 99%  Weight: 94.7 kg     Height: 6' (1.829 m)      Body mass index is 28.32 kg/m. Gen:  WD/WN, NAD Head: [place]/AT, No temporalis wasting. Prominent temp pulse not noted. Pulmonary:  Good air movement, respirations not labored, equal bilaterally.  Cardiac: RRR, normal S1, S2. Vascular: palpable radial pulses, palpable right femoral pulse Gastrointestinal: soft, non-tender/non-distended. No guarding/reflex.  Musculoskeletal: M/S 5/5 throughout.   Extremities without ischemic changes.  No deformity or atrophy. No edema. Large left groin hematoma- firm, skin intact, ecchymosis Neurologic: Sensation grossly intact in extremities.  Symmetrical.  Speech is fluent. Motor exam as listed above. Psychiatric: Judgment intact, Mood & affect appropriate for pt's clinical situation.      CBC Lab Results  Component Value Date   WBC 5.8 12/10/2023   HGB 10.3 (L) 12/10/2023   HCT 30.6 (L) 12/10/2023   MCV 93.6 12/10/2023   PLT 154 12/10/2023    BMET    Component Value Date/Time   NA 136 12/10/2023 0526   NA 142 07/06/2023 1532   K 3.9 12/10/2023 0526   CL 100 12/10/2023 0526   CO2 26 12/10/2023 0526   GLUCOSE 105 (H) 12/10/2023 0526   BUN 21 12/10/2023 0526   BUN 11 07/06/2023 1532   CREATININE 1.13 12/10/2023 0526   CREATININE 1.33 (H) 08/23/2023 1242   CALCIUM  8.8 (L) 12/10/2023 0526   GFRNONAA >60 12/10/2023 0526   GFRNONAA 55 (L) 08/23/2023 1242   GFRAA >60 12/21/2018 0937   Estimated Creatinine Clearance: 65.4 mL/min (by C-G formula based on SCr of 1.13 mg/dL).  COAG Lab Results  Component Value Date   INR 1.4 (H) 04/05/2023   INR 1.34 08/28/2017   INR 0.97 08/27/2017    Radiology CT Angio Abd/Pel W and/or Wo Contrast Result Date: 12/09/2023 CLINICAL DATA:  History of left inguinal hernia repair with hematoma. Suspicion for active bleeding. EXAM: CTA  ABDOMEN AND PELVIS WITHOUT AND WITH CONTRAST TECHNIQUE: Multidetector CT imaging of the abdomen and pelvis was performed using the standard protocol during bolus administration of intravenous contrast. Multiplanar reconstructed images and MIPs were obtained and reviewed to evaluate the vascular anatomy. RADIATION DOSE REDUCTION: This exam was performed according to the departmental dose-optimization program which includes automated exposure control, adjustment of the mA and/or kV according to patient size and/or use of iterative reconstruction technique. CONTRAST:   OMNIPAQUE  IOHEXOL  350 MG/ML SOLN COMPARISON:  Chest CT scan earlier, same date. Prior abdominal CT scan and 07/17/2023 FINDINGS: VASCULAR Aorta: Stable moderate to advanced age related atherosclerotic calcifications but no aneurysm. Sizable ulcerated plaque or focal dissection noted involving the posterior aspect of the lower abdominal aorta on the left side measuring approximately 2.2 cm. Celiac: Atherosclerotic calcifications but no significant stenosis or dissection. SMA: Atherosclerotic calcifications but no significant stenosis. Renals: 2 renal arteries bilaterally. Two right and 3 left renal arteries no significant stenosis. Scattered atherosclerotic calcifications. IMA: Patent Inflow: Moderate atherosclerotic calcifications but no stenosis or dissection. Proximal Outflow: No significant findings. Scattered calcifications. Veins: No significant findings. Review of the MIP images confirms the above findings. NON-VASCULAR Lower chest: No acute pulmonary findings. Pulmonary scarring changes. Hepatobiliary: No hepatic lesions or intrahepatic biliary dilatation. Gallbladder is unremarkable. No common bile duct dilatation. Pancreas: Unremarkable. No pancreatic ductal dilatation or surrounding inflammatory changes. Spleen: Normal in size without focal abnormality. Adrenals/Urinary Tract: Adrenal glands and kidneys are unremarkable. There is contrast in the renal collecting systems from prior CT scan. Bilateral renal cysts, unchanged. The bladder is unremarkable. Stomach/Bowel: The stomach, duodenum, small bowel and colon are grossly normal without oral contrast. No inflammatory changes, mass lesions or obstructive findings. The appendix is normal. Lymphatic: No abdominal or pelvic lymphadenopathy. Reproductive: Enlarged prostate gland. The seminal vesicles are unremarkable. Other: There is a sizable left groin hematoma measuring approximately 7.8 x 2.7 cm. This is anterior to the inguinal canal. No extravasation of  contrast material to suggest active bleeding. Small tortuous vessels in the spermatic cords bilaterally. Musculoskeletal: No significant bony findings. IMPRESSION: 1. Sizable left groin hematoma but no extravasation of contrast material to suggest active bleeding. 2. Sizable ulcerated plaque or focal dissection involving the posterior aspect of the lower abdominal aorta on the left side measuring approximately 2.2 cm. 3. No acute abdominal/pelvic findings, mass lesions or adenopathy. 4. Enlarged prostate gland. 5. Aortic atherosclerosis. Aortic Atherosclerosis (ICD10-I70.0). Electronically Signed   By: MYRTIS Stammer M.D.   On: 12/09/2023 18:02   CT Angio Chest PE W and/or Wo Contrast Result Date: 12/09/2023 CLINICAL DATA:  Pulmonary embolism (PE) suspected, high prob. Near syncopal episode. History of tongue carcinoma. * Tracking Code: BO * EXAM: CT ANGIOGRAPHY CHEST WITH CONTRAST TECHNIQUE: Multidetector CT imaging of the chest was performed using the standard protocol during bolus administration of intravenous contrast. Multiplanar CT image reconstructions and MIPs were obtained to evaluate the vascular anatomy. RADIATION DOSE REDUCTION: This exam was performed according to the departmental dose-optimization program which includes automated exposure control, adjustment of the mA and/or kV according to patient size and/or use of iterative reconstruction technique. CONTRAST:  75mL OMNIPAQUE  IOHEXOL  350 MG/ML SOLN COMPARISON:  CT angiography chest from 07/17/2023. FINDINGS: Cardiovascular: No evidence of embolism to the proximal subsegmental pulmonary artery level. Mild cardiomegaly. No pericardial effusion. No aortic aneurysm. There are coronary artery calcifications, in keeping with coronary artery disease. There are also mild peripheral atherosclerotic vascular calcifications of thoracic aorta and its major branches. Mediastinum/Nodes:  Visualized thyroid  gland appears grossly unremarkable. No solid / cystic  mediastinal masses. The esophagus is nondistended precluding optimal assessment. No axillary, mediastinal or hilar lymphadenopathy by size criteria. Lungs/Pleura: The central tracheo-bronchial tree is patent. There is mild, smooth, circumferential thickening of the segmental and subsegmental bronchial walls, throughout bilateral lungs, which is nonspecific. Findings are most commonly seen with bronchitis or reactive airway disease, such as asthma. There are dependent changes in bilateral lungs. No mass or consolidation. No pleural effusion or pneumothorax. There is a new 5 x 6 mm ground-glass nodule in the right upper lobe (series 5, image 62). Upper Abdomen: Visualized upper abdominal viscera within normal limits. Musculoskeletal: The visualized soft tissues of the chest wall are grossly unremarkable. No suspicious osseous lesions. There are mild to moderate multilevel degenerative changes in the visualized spine. Review of the MIP images confirms the above findings. IMPRESSION: 1. No embolism to the proximal subsegmental pulmonary artery level. No lung mass, consolidation, pleural effusion or pneumothorax. 2. There is a new 5 x 6 mm ground-glass nodule in the right upper lobe. Consider short-term follow-up examination in 3 months to document resolution versus stability. 3. Multiple other nonacute observations, as described above. Aortic Atherosclerosis (ICD10-I70.0). Electronically Signed   By: Ree Molt M.D.   On: 12/09/2023 15:51      Assessment/Plan 1. Aortic ulceration/focal dissection- non flow limiting, no evidence of periaortic hematoma No intervention recommended at this time. Have patient follow up with Vascular Surgery 1-2 weeks for carotid evaluation follow up-s/p stenting LEFT groin hematoma- no evidence of active extravasation Resume anticoagulation per Gen Surgery    Tisa Curry LABOR, MD  12/10/2023 8:57 AM    This note was created with Dragon medical transcription system.   Any error is purely unintentional

## 2023-12-10 NOTE — Progress Notes (Incomplete)
 Progress Note   Patient: Cory Hess FMW:[id number] DOB: May 24, 1945 DOA: 12/09/2023     0 DOS: the patient was seen and examined on 12/10/2023     Brief hospital course:   HPI [name] is a 78 year old male with hypothyroid, hypertension, paroxysmal atrial fibrillation on Eliquis , recent left inguinal hernia repair 10/13, who presents ED for chief concerns of near syncope while sitting at [facility]. Vitals in the ED showed t of 98, rr 17, hr 93, blood pressure 152/85, SpO2 97% on room air. Serum sodium is 138, potassium 4.1, chloride 101, bicarb 27, BUN 24, serum creatinine 1.16, eGFR > 60, nonfasting blood glucose 111, WBC 6.7, hemoglobin 10.2, platelets of 151. He reports he had breakfast at 6 AM on the same day of presentation.  At breakfast, he had grits with sugar and several many muffins. At around 9:30 AM, he went to [employer] to meet with some old time friends.  They hung out at [facility].  He took 1 dose of oxycodone  that was prescribed to him due to the hernia repair surgery.  He did not eat with this medication. At approximately 1130, while at [facility], he developed dizziness and he felt like his eyes rolling in the back of his head and extreme weakness.  He denies loss of consciousness and states he was aware the entire time.  He reports this is never happened before.  He reports this feels different from other episodes of vertigo that he has been diagnosed.     Assessment and Plan:   Acute symptomatic bradycardia with near syncopal episode This morning patient had near syncopal episode while sitting at the edge of the bed and was noted to have heart rate dropping into the low 30s.  Rapid response was called A dose of atropine  ordered Cardiologist consulted stat and case discussed with Dr. Raford I responded to rapid response together with neurosurgeon at bedside. Vascular surgery asked for CT neck to be obtained due to recent carotid stent and subsequently  order placed Patient still on telemetry and heart rate noted to have returned to high 50s to 60s. When patient got back to the room was noted to have had another drop in heart rate and subsequently transferred to the ICU Plan of care discussed with intensivist and dopamine  drip initiated and signout given to ICU for further management as he is now requiring drip management. We will avoid AV nodal blocking agent   Acute blood loss anemia Sizable left groin hematoma without extravasation of contrast material to suggest active bleeding Sizable ulcerated plaque or focal dissection involving posterior aspect of the lower abdominal aorta on the left side measuring approximately 2.2 cm Hold home Eliquis  We will monitor H&H Vascular surgery on board and case discussed   Coronary artery disease Atorvastatin  80 mg nightly   Acquired hypothyroidism Continue levothyroxine  100 mcg daily before breakfast   Essential hypertension Monitor blood pressure closely Holding all antihypertensives at this time given relative hypotension Carvedilol  discontinued   Chart reviewed.    DVT prophylaxis: SCD Code Status: DNR/DNI, ACP reviewed Diet: Healthy diet Family Communication: I called patient's sister over the phone and case discussed Disposition Plan: Pending clinical course Consults called: Vascular, general surgery     Subjective:  This morning patient had near syncopal episode while sitting at the edge of the bed and was noted to have heart rate dropping into the low 30s.  Rapid response was called He did admit to having dizziness and lightheadedness Denied chest  pain cough nausea vomiting   Physical Exam:   Constitutional: appears age-appropriate, weak, frail Eyes: PERRL, lids and conjunctivae normal Neck: normal, supple, no masses, no thyromegaly Respiratory: clear to auscultation bilaterally, no wheezing, no crackles. Normal respiratory effort. No accessory muscle use.  Cardiovascular:  S1-S2 present, bradycardic Abdomen: no tenderness, no masses palpated, no hepatosplenomegaly. Bowel sounds positive.  Musculoskeletal: no clubbing / cyanosis. No joint deformity upper and lower extremities. Good ROM, no contractures, no atrophy. Normal muscle tone.  Skin: Left groin swelling with firmness and extensive ecchymosis extending to the penis circumferentially, left groin tenderness Neurologic: Sensation intact. Strength 5/5 in all 4.  Psychiatric: Normal judgment and insight. Alert and oriented x 3. Normal mood.     Data Reviewed: CT scan reviewed    Latest Ref Rng & Units 12/10/2023    5:26 AM 12/09/2023    1:13 PM 11/29/2023    2:11 PM  BMP  Glucose 70 - 99 mg/dL 894  888  87   BUN 8 - 23 mg/dL 21  24  20    Creatinine 0.61 - 1.24 mg/dL 8.86  8.83  8.87   Sodium 135 - 145 mmol/L 136  138  139   Potassium 3.5 - 5.1 mmol/L 3.9  4.1  4.0   Chloride 98 - 111 mmol/L 100  101  104   CO2 22 - 32 mmol/L 26  27  27    Calcium  8.9 - 10.3 mg/dL 8.8  8.5  8.7     Vitals:   12/10/23 1500 12/10/23 1515 12/10/23 1530 12/10/23 1545  BP: 115/60  123/70   Pulse: 85 82 87 63  Resp: 18 17 (!) 21 16  Temp:      TempSrc:      SpO2: 95% 99% 97% 99%  Weight:      Height:          Latest Ref Rng & Units 12/10/2023    5:26 AM 12/09/2023    1:13 PM 11/29/2023    2:11 PM  CBC  WBC 4.0 - 10.5 K/uL 5.8  6.7  5.7   Hemoglobin 13.0 - 17.0 g/dL 89.6  89.7  88.4   Hematocrit 39.0 - 52.0 % 30.6  30.8  35.2   Platelets 150 - 400 K/uL 154  151  155      Author: Drue ONEIDA Potter, MD 12/10/2023 4:51 PM  For on call review [email or web address].

## 2023-12-11 ENCOUNTER — Inpatient Hospital Stay
Admit: 2023-12-11 | Discharge: 2023-12-11 | Disposition: A | Attending: Physician Assistant | Admitting: Physician Assistant

## 2023-12-11 DIAGNOSIS — R570 Cardiogenic shock: Secondary | ICD-10-CM

## 2023-12-11 DIAGNOSIS — I442 Atrioventricular block, complete: Secondary | ICD-10-CM | POA: Diagnosis not present

## 2023-12-11 DIAGNOSIS — D62 Acute posthemorrhagic anemia: Secondary | ICD-10-CM | POA: Diagnosis not present

## 2023-12-11 DIAGNOSIS — I4891 Unspecified atrial fibrillation: Secondary | ICD-10-CM

## 2023-12-11 DIAGNOSIS — I2510 Atherosclerotic heart disease of native coronary artery without angina pectoris: Secondary | ICD-10-CM | POA: Diagnosis not present

## 2023-12-11 LAB — BASIC METABOLIC PANEL WITH GFR
Anion gap: 11 (ref 5–15)
BUN: 25 mg/dL — ABNORMAL HIGH (ref 8–23)
CO2: 23 mmol/L (ref 22–32)
Calcium: 8.7 mg/dL — ABNORMAL LOW (ref 8.9–10.3)
Chloride: 105 mmol/L (ref 98–111)
Creatinine, Ser: 1.35 mg/dL — ABNORMAL HIGH (ref 0.61–1.24)
GFR, Estimated: 54 mL/min — ABNORMAL LOW (ref >60)
Glucose, Bld: 100 mg/dL — ABNORMAL HIGH (ref 70–99)
Potassium: 4.7 mmol/L (ref 3.5–5.1)
Sodium: 139 mmol/L (ref 135–145)

## 2023-12-11 LAB — CBC WITH DIFFERENTIAL/PLATELET
Abs Immature Granulocytes: 0.02 K/uL (ref 0.00–0.07)
Basophils Absolute: 0 K/uL (ref 0.0–0.1)
Basophils Relative: 1 %
Eosinophils Absolute: 0.2 K/uL (ref 0.0–0.5)
Eosinophils Relative: 3 %
HCT: 30.5 % — ABNORMAL LOW (ref 39.0–52.0)
Hemoglobin: 10 g/dL — ABNORMAL LOW (ref 13.0–17.0)
Immature Granulocytes: 0 %
Lymphocytes Relative: 14 %
Lymphs Abs: 0.9 K/uL (ref 0.7–4.0)
MCH: 31.3 pg (ref 26.0–34.0)
MCHC: 32.8 g/dL (ref 30.0–36.0)
MCV: 95.6 fL (ref 80.0–100.0)
Monocytes Absolute: 0.7 K/uL (ref 0.1–1.0)
Monocytes Relative: 11 %
Neutro Abs: 4.5 K/uL (ref 1.7–7.7)
Neutrophils Relative %: 71 %
Platelets: 147 K/uL — ABNORMAL LOW (ref 150–400)
RBC: 3.19 MIL/uL — ABNORMAL LOW (ref 4.22–5.81)
RDW: 12.5 % (ref 11.5–15.5)
WBC: 6.2 K/uL (ref 4.0–10.5)
nRBC: 0 % (ref 0.0–0.2)

## 2023-12-11 LAB — ECHOCARDIOGRAM COMPLETE
AR max vel: 2.09 cm2
AV Area VTI: 2.09 cm2
AV Area mean vel: 1.82 cm2
AV Mean grad: 2 mmHg
AV Peak grad: 4.4 mmHg
Ao pk vel: 1.05 m/s
Area-P 1/2: 4.44 cm2
Height: 72 in
MV VTI: 1.61 cm2
S' Lateral: 3.2 cm
Weight: 3400.38 [oz_av]

## 2023-12-11 LAB — TROPONIN I (HIGH SENSITIVITY)
Troponin I (High Sensitivity): 24 ng/L — ABNORMAL HIGH (ref <18)
Troponin I (High Sensitivity): 24 ng/L — ABNORMAL HIGH (ref <18)

## 2023-12-11 MED ORDER — ACETAMINOPHEN 10 MG/ML IV SOLN: 1000.0000 mg | Freq: Four times a day (QID) | INTRAVENOUS | Status: DC | PRN

## 2023-12-11 MED ORDER — LACTATED RINGERS IV SOLN: INTRAVENOUS | Status: AC

## 2023-12-11 NOTE — Progress Notes (Signed)
 Progress Note  Patient Name: Cory Hess Date of Encounter: 12/11/2023  Primary Cardiologist: End  Subjective   He underwent emergent venous temp wire on 10/18 in the setting of symptomatic high-grade AV block. No longer requiring dopamine . BP stable. Feels calm inside now. No further dizziness, near syncope, or nausea. No chest pain or dyspnea.   Inpatient Medications    Scheduled Meds:  atorvastatin   80 mg Oral QHS   Chlorhexidine  Gluconate Cloth  6 each Topical Q0600   famotidine   20 mg Oral Daily   levothyroxine   100 mcg Oral QAC breakfast   multivitamin with minerals  1 tablet Oral Daily   Continuous Infusions:  PRN Meds: acetaminophen  **OR** acetaminophen , hydrALAZINE , ondansetron  **OR** ondansetron  (ZOFRAN ) IV, senna-docusate   Vital Signs    Vitals:   12/11/23 0400 12/11/23 0500 12/11/23 0600 12/11/23 0700  BP: 113/61 122/70 (!) 116/54 (!) 115/56  Pulse: 61 63 61 60  Resp: 18 15 19  (!) 0  Temp: 98.3 F (36.8 C)     TempSrc: Oral     SpO2: 94% 96% 98% 96%  Weight:      Height:        Intake/Output Summary (Last 24 hours) at 12/11/2023 0727 Last data filed at 12/11/2023 0600 Gross per 24 hour  Intake 1380.29 ml  Output 1450 ml  Net -69.71 ml   Filed Weights   12/09/23 1212 12/09/23 2159 12/10/23 1130  Weight: 94.3 kg 94.7 kg 96.4 kg    Telemetry    V-paced - Personally Reviewed  ECG    No new tracings - Personally Reviewed  Physical Exam   GEN: No acute distress.   Neck: No JVD. Cardiac: RRR, no murmurs, rubs, or gallops. Right femoral venous pacer insertion site without active bleeding, warmth, erythema, or TTP.  Respiratory: Clear to auscultation bilaterally.  GI: Soft, nontender, non-distended.   MS: No edema; No deformity. Neuro:  Alert and oriented x 3; Nonfocal.  Psych: Normal affect.  Labs    Chemistry Recent Labs  Lab 12/09/23 1313 12/10/23 0526 12/11/23 0432  NA 138 136 139  K 4.1 3.9 4.7  CL 101 100 105  CO2  27 26 23   GLUCOSE 111* 105* 100*  BUN 24* 21 25*  CREATININE 1.16 1.13 1.35*  CALCIUM  8.5* 8.8* 8.7*  PROT 6.3*  --   --   ALBUMIN  3.3*  --   --   AST 38  --   --   ALT 30  --   --   ALKPHOS 64  --   --   BILITOT 1.0  --   --   GFRNONAA >60 >60 54*  ANIONGAP 10 10 11      Hematology Recent Labs  Lab 12/09/23 1313 12/10/23 0526 12/11/23 0432  WBC 6.7 5.8 6.2  RBC 3.23* 3.27* 3.19*  HGB 10.2* 10.3* 10.0*  HCT 30.8* 30.6* 30.5*  MCV 95.4 93.6 95.6  MCH 31.6 31.5 31.3  MCHC 33.1 33.7 32.8  RDW 12.6 12.7 12.5  PLT 151 154 147*    Cardiac EnzymesNo results for input(s): TROPONINI in the last 168 hours. No results for input(s): TROPIPOC in the last 168 hours.   BNPNo results for input(s): BNP, PROBNP in the last 168 hours.   DDimer No results for input(s): DDIMER in the last 168 hours.   Radiology    CT ANGIO NECK W OR WO CONTRAST Result Date: 12/10/2023 IMPRESSION: 1. Mixed atherosclerotic plaque at the right carotid bifurcation and bulb resulting  in approximately 55% stenosis of the right ICA origin. 2. Moderate stenosis at the right vertebral artery origin. 3. Additional atherosclerosis as above. 4. 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 Electronically signed by: Donnice Mania MD 12/10/2023 10:55 AM EDT RP Workstation: [id number]   CT Angio Abd/Pel W and/or Wo Contrast Result Date: 12/09/2023 IMPRESSION: 1. Sizable left groin hematoma but no extravasation of contrast material to suggest active bleeding. 2. Sizable ulcerated plaque or focal dissection involving the posterior aspect of the lower abdominal aorta on the left side measuring approximately 2.2 cm. 3. No acute abdominal/pelvic findings, mass lesions or adenopathy. 4. Enlarged prostate gland. 5. Aortic atherosclerosis. Aortic Atherosclerosis (ICD10-I70.0). Electronically Signed   By: MYRTIS Stammer M.D.   On: 12/09/2023 18:02   CT Angio Chest PE W and/or Wo Contrast Result Date:  12/09/2023 IMPRESSION: 1. No embolism to the proximal subsegmental pulmonary artery level. No lung mass, consolidation, pleural effusion or pneumothorax. 2. There is a new 5 x 6 mm ground-glass nodule in the right upper lobe. Consider short-term follow-up examination in 3 months to document resolution versus stability. 3. Multiple other nonacute observations, as described above. Aortic Atherosclerosis (ICD10-I70.0). Electronically Signed   By: Ree Molt M.D.   On: 12/09/2023 15:51    Cardiac Studies   Limited echo 04/16/2023: 1. Left ventricular ejection fraction, by estimation, is 55 to 60%. Left  ventricular ejection fraction by 2D MOD biplane is 56.9 %. The left  ventricle has normal function. The left ventricle has no regional wall  motion abnormalities. There is mild left  ventricular hypertrophy.   2. Right ventricular systolic function is normal. The right ventricular  size is normal.  __________   2D echo 04/07/2023: 1. Very challenging images, definity  not used   2. Left ventricular ejection fraction, by estimation, is 40 to 45 %. The  left ventricle has mildly decreased function. The left ventricle  demonstrates regional wall motion abnormalities (images suggest  anteroseptal, inferior/posterior and apical  hypokinesis, possibly anterior wall hypokinesis). There is mild left  ventricular hypertrophy. Left ventricular diastolic parameters are  consistent with Grade I diastolic dysfunction (impaired relaxation).   3. Right ventricular systolic function was not well visualized. The right  ventricular size is normal.   4. The mitral valve is normal in structure. No evidence of mitral valve  regurgitation. No evidence of mitral stenosis.   5. The aortic valve is normal in structure. There is mild calcification  of the aortic valve. Aortic valve regurgitation is not visualized. Aortic  valve sclerosis/calcification is present, without any evidence of aortic  stenosis. Aortic  valve mean  gradient measures 2.0 mmHg.  __________   [facility] 04/05/2023: Conclusions: Severe native coronary artery disease with occluded LMCA and diffusely diseased RCA with up to 95% stenosis. Widely patent LIMA-LAD and SVG-LCx. Occluded SVG-PDA, likely chronic. Severely reduced left ventricular systolic function (LVEF 25-30%) with anterior hypokinesis/akinesis. Mildly to moderately elevated left heart filling pressures (LVEDP 20 mmHg; PCWP 25 mmHg). Moderately to severely elevated right heart filling pressures (mean RA 15 mmHg, RV 40/15 mmHg).  PAPi of 1 suggests an element of RV dysfunction. Successful placement of temporary transvenous pacemaker via the right femoral vein.   Recommendations: No obvious culprit lesion identified for the patient's cardiac arrest, marked bradycardia/complete heart block, cardiogenic shock, and acute on chronic HFrEF.  Continue secondary prevention of CAD. Significant hypoxia noted throughout catheterization despite aggressive oxygenation with the ventilator.  Question if some other process such as pulmonary embolism  or intrinsic lung disease could be contributing.  Recommend empiric heparin  to be given 2 hours after right femoral artery hemostasis was achieved.  Further workup per critical care team. Obtain stat portable chest x-ray when patient arrives in the ICU to confirm location of temporary transvenous pacing wire and pulmonary artery catheter. Continue gentle diuresis as blood pressure allows. Wean epinephrine  infusion. __________   See CV Studies in Epic for more remote imaging  Patient Profile     78 y.o. male with history of CAD status post CABG x 3 and 08/2016, HFrEF secondary to ICM, PE with PEA arrest in 03/2023, alternating bundle branch block, carotid artery disease status post recent right ICA stenting on 10/26/2023, HTN, HLD, bladder tumor status post TURBT, tongue cancer status post chemoradiation who is being seen today for the evaluation of  symptomatic bradycardia at the request of Dr. Dorinda.   Assessment & Plan    1. 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: - Previously required venous temp wire in 03/2023 in the setting of PEA arrest in the setting of pulmonary embolism - Developed CHB on 10/18 with subsequent absent ventricular escape leading to near syncope, dizziness, and nausea - Transferred emergently to the ICU, received calcium  gluconate, atropine , brief dopamine  gtt with subsequent venous temp wire placement on 10/18 - Continue to hold carvedilol  with last dose of 6.25 mg received at 0828 on 10/18 - Continue with venous temp pacing fro today - EP to evaluate 10/20 - TSH significantly elevated with low free T4 - Potassium and magnesium  normal - Obtain echo   2.  CAD status post CABG without angina: - No symptoms suggestive of angina - High-sensitivity troponin negative x 2 this admission - Recommend resuming aspirin  when felt to be safe from a surgical perspective given recent hernia repair in the context of left groin hematoma without evidence of active extravasation - Atorvastatin  80 mg - No plans for inpatient ischemic evaluation at this time   3.  HFrEF: - Appears euvolemic and well compensated - Hold carvedilol  as outlined above - Hold losartan  for now - Not requiring active diuresis - Obtain echo   4.  History of PEA arrest: - In the setting of pulmonary embolism status post thrombectomy and OAC   5.  Carotid artery disease: - Status post right ICA stenting in 10/2023 - Unlikely contributing to bradycardic episodes currently experienced given timeframe since intervention - Management per vascular surgery   6.  Left groin hematoma/ulcerated plaque versus focal dissection involving the posterior aspect of the lower abdominal aorta: - BP currently stable - Management per vascular surgery   7.   HTN: - Allow for permissive hypertension   8.  Hypothyroidism: - Poorly controlled on most recent outpatient labs - Remains on levothyroxine , adjustment per primary    9.  HLD: - LDL 25 in 03/2023 - Atorvastatin  80 mg       For questions or updates, please contact [facility] Please consult [email or web address] for contact info under Cardiology/STEMI.    Signed, Bernardino Bring, PA-C [facility] Pager: [phone] 12/11/2023, 7:27 AM

## 2023-12-11 NOTE — Progress Notes (Signed)
 NAME:  Cory Hess, MRN:  [id number], DOB:  [birth year], LOS: 1 ADMISSION DATE:  12/09/2023, CONSULTATION DATE:  12/10/2023 REFERRING MD:  Greig Free, CHIEF COMPLAINT:  Syncopal episode  History of Present Illness:  Cory Hess is a 78 y.o. male with a PMH of Complete heart block, Paroxysmal A Fib on Eliquis , CABG x 3, Cardiomyopathy, Hypertension, CAD, CKD, HFimpEF, STEMI, Cancer of base of tongue and hypothyroidism. He recently had a hernia repair on 10/13 but returned to [facility] on 10/17 following a near syncopal episode at [facility]. Prior to the near syncopal episode, he reported symptoms of angina. In the ED he received a full workup, with a Chest CTA negative for acute PE. CTA Abdomen/Pelvis showed left groin hematoma without extravasation of contrast, suggesting active bleed, along with a sizable ulcerated plaque or focal dissection involving the posterior aspect of the lower abdominal aorta of the left side (~2.2cm). Labs significant for mild stable anemia (Hgb 10.2-10.3). Negative troponins x 2.  On the morning of 10/18, Cory Hess developed intermittent high-grade AV block with multiple prolonged pauses with associated near syncope. At the time he did not require atropine , CPR or transcutaneous pacing. Following the acute event, the patient was transferred to CT. It is noted that he was taking Carvedilol  6.25mg  BID prior to admission, with his most recent dose at 0828 on 10/18. Following CT imaging, Cardiology met the patient, and at the time his cardiac monitor displayed sinus bradycardia (Rates 40-50sbpm) with bundle branch block. During initial cardiology consult, he was hemodynamically stable in NSR with a bradycardic rate in the 50s with an underlying BBB, BP 121/44.  After the initial visit from Cardiology, he later developed Complete Heart Block, followed by absent ventricular response and syncope. It was noted this episode was soon after receiving his morning dose of Carvedilol . Rate at this  time was 20-30s. A rapid response was called and the patient was then transferred to the ICU for consultation. Dr. Florencio came to see the patient and is planning to place a temporary PM. Orders per Cardiology were placed, including Atropine  and Calcium  Gluconate. Once in the ICU the patient was started on dopamine  until the temp wire is placed.   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 HR 20-30bpm, pt transferred to the ICU. Started on dopamine  with orders for atropine  and calcium  gluconate placed. Dopamine  requirements increased to 10mcg/kg/min with HR in the 40s-50s. Cardiology planning to place a temporary pacemaker today. 12/11/23: No overnight events. Remains HD stable following temp PM placement. Cardiology following. No longer requiring chronotropes or inotropes.   Interim History / Subjective:  See above listed under Significant Hospital Events.  Objective    Blood pressure (!) 115/56, pulse 60, temperature 98.3 F (36.8 C), temperature source Oral, resp. rate (!) 0, height 6' (1.829 m), weight 96.4 kg, SpO2 96%.        Intake/Output Summary (Last 24 hours) at 12/11/2023 0800 Last data filed at 12/11/2023 0600 Gross per 24 hour  Intake 1380.29 ml  Output 1450 ml  Net -69.71 ml   Filed Weights   12/09/23 1212 12/09/23 2159 12/10/23 1130  Weight: 94.3 kg 94.7 kg 96.4 kg     Examination: General: well developed, well nourished, lying in bed in NAD HENT: atraumatic, normocephalic, no JVD Lungs: clear and equal breath sounds bilaterally, unlabored, no wheezing/rales/rhonci Cardiovascular: RRR, S1 S2. No m/r/g Abdomen: soft, non-tender, non-distended, bowel sounds x 4, no rebound tenderness Extremities: warm/intact, no edema, radial pulses 2+, distal 1+ Neuro: A&O x 4, no focal neuro deficits, follows commands, PERRL GU: deferred  Resolved problem list   Assessment and Plan   #Cardiogenic Shock ~ Resolved #Syncope secondary to Symptomatic Bradycardia due to High-Grade AV Block #S/P Temporary Pacemaker Placement 10/18 #Alternating LBBB/RBBB #Hyperlipidemia #Atrial Fibrillation - on Eliquis  Hx: CAD S/P CABG x 3, HFimpEF secondary to ICM, PE with PEA 2/25, alternating BBB, CAD S/P Right ICA stent 10/26/2023, Hypertension, Hyperlipidemia 12/10/23 CTA Neck w/wo Contrast: mixed atherosclerotic plaque at right carotid bifurcation, 55% stenosis of right ICA origin. Moderate stenosis of right vertebral artery origin. 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 - Continuous cardiac monitoring - Vasopressors to maintain MAP goal of >65 ~ Not currently requiring - 12 Lead ECG as indicated - Temporary Pacemaker placed, continue today - Troponin x 2 negative - Home meds: Carvedilol  6.25 po BID, Losartan  25mg  QD - Continue Atorvastatin  80mg  - Antihypertensives: Hydralazine  5mg  IV Q6H prn for SBP > 160 - EP evaluation scheduled for 10/20 - ECHO pending per Cardiology; appreciate input from Cardiology  Pulmonary - Supplemental oxygen to maintain O2 sats >92% ~ not currently requiring - Remains on room air - Continue incentive spirometry  #Acute Kidney Injury Secondary to Cardiogenic Shock - I/Os - Trend BMP and renal function - Creatinine slightly declined, currently 1.35 - Avoid nephrotoxins as able - IV Fluids as indicated - Electrolyte  replacement protocol; electrolytes stable  #Acute Blood Loss Anemia secondary to Left Groin Hematoma - Trend CBC - Monitor H&H; Hgb 10.0 (Baseline ~11-12) - Transfuse if Hgb <7 - Monitor for s/sx of bleeding - Holding home Eliquis  per hospitalist - General surgery following - Vascular following  #Hypothyroidism - TSH: 42.899, Free T4: 0.60 - Continue Levothyroxine  - ICU hypo/hyperglycemia protocol   Labs   CBC: Recent Labs  Lab 12/09/23 1313 12/10/23 0526 12/11/23 0432  WBC 6.7 5.8 6.2  NEUTROABS 5.0  --  4.5  HGB 10.2* 10.3* 10.0*  HCT 30.8* 30.6* 30.5*  MCV 95.4 93.6 95.6  PLT 151 154 147*    Basic Metabolic Panel: Recent Labs  Lab 12/09/23 1313 12/10/23 0526 12/11/23 0432  NA 138 136 139  K 4.1 3.9 4.7  CL 101 100 105  CO2 27 26 23   GLUCOSE 111* 105* 100*  BUN 24* 21 25*  CREATININE 1.16 1.13 1.35*  CALCIUM  8.5* 8.8* 8.7*  MG 2.1 2.1  --    GFR: Estimated Creatinine Clearance: 55.2 mL/min (A) (by C-G formula based on SCr of 1.35 mg/dL (H)). Recent Labs  Lab 12/09/23 1313 12/10/23 0526 12/11/23 0432  WBC 6.7 5.8 6.2    Liver Function Tests: Recent Labs  Lab 12/09/23 1313  AST 38  ALT 30  ALKPHOS 64  BILITOT 1.0  PROT 6.3*  ALBUMIN  3.3*   No results for input(s): LIPASE, AMYLASE in the last 168 hours. No results for input(s): AMMONIA in the last 168 hours.  ABG    Component Value Date/Time   PHART 7.38  04/15/2023 0515   PCO2ART 40 04/15/2023 0515   PO2ART 175 (H) 04/15/2023 0515   HCO3 23.7 04/15/2023 0515   TCO2 33 (H) 04/05/2023 1459   ACIDBASEDEF 1.3 04/15/2023 0515   O2SAT 100 04/15/2023 0515     Coagulation Profile: No results for input(s): INR, PROTIME in the last 168 hours.  Cardiac Enzymes: No results for input(s): CKTOTAL, CKMB, CKMBINDEX, TROPONINI in the last 168 hours.  HbA1C: Hgb A1c MFr Bld  Date/Time Value Ref Range Status  07/06/2023 03:31 PM 5.2 4.8 - 5.6 % Final    Comment:              Prediabetes: 5.7 - 6.4          Diabetes: >6.4          Glycemic control for adults with diabetes: <7.0   04/05/2023 07:38 PM 5.9 (H) 4.8 - 5.6 % Final    Comment:    (NOTE) Pre diabetes:          5.7%-6.4%  Diabetes:              >6.4%  Glycemic control for   <7.0% adults with diabetes     CBG: Recent Labs  Lab 12/10/23 1125  GLUCAP 98    Review of Systems:   Pt endorses fatigue, loss of consciousness and weakness. Denies chest pain or shortness of breath. All other systems negative.  Past Medical History:  He,  has a past medical history of Abscess of right arm (03/2023), Alternating RBBB & LBBB, Ankle swelling (2024), Anticoagulated on apixaban , Aortic atherosclerosis, Arthritis of neck, Bilateral inguinal hernia, Bladder tumor, BPH (benign prostatic hyperplasia), CAD (coronary artery disease), Cancer of base of tongue (HCC) (09/22/2022), Cardiogenic shock (HCC) (04/05/2023), Cardiomegaly, Carotid stenosis, Cerebral microvascular disease, Cholelithiasis, CKD (chronic kidney disease), stage [name] (HCC), Complete heart block (HCC) (04/05/2023), CVA (cerebral vascular accident) (HCC) (04/08/2023), Essential hypertension, Former smoker, GERD (gastroesophageal reflux disease), Hepatic steatosis, HFimpEF (heart failure with improved ejection fraction) (HCC), Hyperlipidemia LDL goal <70, Hypothyroidism, Ischemic cardiomyopathy, Long-term use of aspirin  therapy, Nephrolithiasis, PEA (Pulseless electrical activity) (HCC) (04/05/2023), Port-A-Cath in place, Pulmonary embolism (HCC) (04/15/2023), S/P CABG x 3 (08/27/2017), Shingles (2018), Status post bilateral cataract extraction, STEMI (ST elevation myocardial infarction) (HCC) (08/27/2017), Thrombocytopenia, and Vertigo.   Surgical History:   Past Surgical History:  Procedure Laterality Date   CARDIAC CATHETERIZATION     CAROTID PTA/STENT INTERVENTION Right 10/26/2023   Procedure: CAROTID PTA/STENT INTERVENTION;  Surgeon: Marea Selinda RAMAN, MD;  Location: [facility];  Service: Cardiovascular;  Laterality: Right;   CATARACT EXTRACTION W/PHACO Left 07/18/2018   Procedure: CATARACT EXTRACTION PHACO AND INTRAOCULAR LENS PLACEMENT (IOC)  LEFT;  Surgeon: Myrna Adine Anes, MD;  Location: [facility];  Service: Ophthalmology;  Laterality: Left;   CATARACT EXTRACTION W/PHACO Right 08/14/2018   Procedure: CATARACT EXTRACTION PHACO AND INTRAOCULAR LENS PLACEMENT (IOC)  RIGHT;  Surgeon: Myrna Adine Anes, MD;  Location: [facility];  Service: Ophthalmology;  Laterality: Right;   CORONARY ARTERY BYPASS GRAFT N/A 08/27/2017   Procedure: CORONARY ARTERY BYPASS GRAFTING (CABG) ON PUMP USING LEFT INTERNAL MAMMARY ARTERY AND LEFT GREATER SAPHENOUS VEIN VIA ENDOVEIN HARVEST;  Surgeon: Lucas Dorise POUR, MD;  Location: [facility];  Service: Open Heart Surgery;  Laterality: N/A;   CORONARY/GRAFT ACUTE MI REVASCULARIZATION N/A 08/27/2017   Procedure: Coronary/Graft Acute MI Revascularization;  Surgeon: Darron Deatrice LABOR, MD;  Location: [facility];  Service: Cardiovascular;  Laterality: N/A;   CYSTOSCOPY  W/ RETROGRADES Bilateral 12/25/2018   Procedure: CYSTOSCOPY WITH RETROGRADE PYELOGRAM;  Surgeon: Penne Knee, MD;  Location: [facility] OR[facility];  Service: Urology;  Laterality: Bilateral;   FRACTURE SURGERY Right 1958   arm and left wrist compound fracture, no metal   HERNIA REPAIR Right    inguinial   INGUINAL HERNIA REPAIR Left 12/05/2023   Procedure: REPAIR, HERNIA, INGUINAL, ADULT;  Surgeon: Marinda Jayson KIDD, MD;  Location: [facility];  Service: General;  Laterality: Left;  open procedure with mesh   INSERTION OF MESH  12/05/2023   Procedure: INSERTION OF MESH;  Surgeon: Marinda Jayson KIDD, MD;  Location: [facility];  Service: General;;   IR IMAGING GUIDED PORT INSERTION  10/15/2022   LEFT HEART CATH AND CORONARY ANGIOGRAPHY N/A 08/27/2017   Procedure: LEFT HEART CATH AND CORONARY ANGIOGRAPHY;  Surgeon: Darron Deatrice LABOR, MD;   Location: [facility];  Service: Cardiovascular;  Laterality: N/A;   PULMONARY THROMBECTOMY Bilateral 04/18/2023   Procedure: PULMONARY THROMBECTOMY;  Surgeon: Marea Selinda RAMAN, MD;  Location: [facility];  Service: Cardiovascular;  Laterality: Bilateral;   RIGHT/LEFT HEART CATH AND CORONARY/GRAFT ANGIOGRAPHY N/A 04/05/2023   Procedure: RIGHT/LEFT HEART CATH AND CORONARY/GRAFT ANGIOGRAPHY;  Surgeon: Mady Bruckner, MD;  Location: [facility];  Service: Cardiovascular;  Laterality: N/A;   TEMPORARY PACEMAKER N/A 04/05/2023   Procedure: TEMPORARY PACEMAKER;  Surgeon: Mady Bruckner, MD;  Location: [facility];  Service: Cardiovascular;  Laterality: N/A;   TRANSURETHRAL RESECTION OF BLADDER TUMOR WITH MITOMYCIN -C N/A 12/25/2018   Procedure: TRANSURETHRAL RESECTION OF BLADDER TUMOR WITH Gemcitabine ;  Surgeon: Penne Knee, MD;  Location: [facility];  Service: Urology;  Laterality: N/A;     Social History:   reports that he quit smoking about 25 years ago. His smoking use included cigarettes. He has been exposed to tobacco smoke. He has quit using smokeless tobacco.  His smokeless tobacco use included snuff. He reports that he does not currently use alcohol. He reports that he does not currently use drugs.   Family History:  His family history includes Cervical cancer in his maternal grandmother; Heart failure in his mother; Lung disease in his father; Stroke in his father.   Allergies No Known Allergies   Home Medications  Prior to Admission medications   Medication Sig Start Date End Date Taking? Authorizing Provider  oxyCODONE  (ROXICODONE ) 5 MG immediate release tablet Take 1 tablet (5 mg total) by mouth every 8 (eight) hours as needed. 12/08/23 12/07/24  Marinda Jayson KIDD, MD  acetaminophen  (TYLENOL ) 500 MG tablet Take 1,000 mg by mouth in the morning and at bedtime.    [provider]  apixaban  (ELIQUIS ) 5 MG TABS tablet Take 1 tablet (5 mg total)  by mouth 2 (two) times daily. 12/07/23   Marinda Jayson KIDD, MD  aspirin  81 MG chewable tablet Place 1 tablet (81 mg total) into feeding tube daily. 12/07/23   Marinda Jayson KIDD, MD  atorvastatin  (LIPITOR ) 80 MG tablet Place 1 tablet (80 mg total) into feeding tube daily. Patient taking differently: Take 80 mg by mouth daily. 09/13/23   Abigail Bernardino HERO, PA-C  Blood Pressure Monitoring (BLOOD PRESSURE KIT) KIT 1 each by Does not apply route in the morning, at noon, and at bedtime. 07/25/23   Butler, Kristina, FNP  carvedilol  (COREG ) 6.25 MG tablet Take 1 tablet (6.25 mg total) by mouth 2 (two) times daily with a meal. 08/11/23   Vivienne Bruckner Ingle, NP  famotidine  (PEPCID ) 20 MG tablet  Place 1 tablet (20 mg total) into feeding tube daily. Patient taking differently: Take 20 mg by mouth daily. 09/28/23   Vivienne Lonni Ingle, NP  levothyroxine  (SYNTHROID ) 100 MCG tablet Take 1 tablet (100 mcg total) by mouth daily before breakfast. 08/23/23   Brahmanday, Govinda R, MD  losartan  (COZAAR ) 25 MG tablet Take 1 tablet (25 mg total) by mouth daily. 08/11/23 12/05/23  Vivienne Lonni Ingle, NP  Multiple Vitamin (MULTIVITAMIN WITH MINERALS) TABS tablet Take 1 tablet by mouth daily.    [provider]  oxyCODONE  (OXY IR/ROXICODONE ) 5 MG immediate release tablet Take 1 tablet (5 mg total) by mouth every 6 (six) hours as needed for severe pain (pain score 7-10). 12/05/23   Marinda Jayson KIDD, MD     Critical care time: 50 minutes     Maranatha Grossi, PA-C [facility] PCCM Team Contact Info: [phone]

## 2023-12-12 ENCOUNTER — Encounter: Payer: Self-pay | Admitting: Internal Medicine

## 2023-12-12 DIAGNOSIS — I442 Atrioventricular block, complete: Secondary | ICD-10-CM | POA: Diagnosis not present

## 2023-12-12 LAB — LYME DISEASE SEROLOGY W/REFLEX: Lyme Total Antibody EIA: NEGATIVE

## 2023-12-12 MED ORDER — OXYCODONE-ACETAMINOPHEN 5-325 MG PO TABS
1.0000 | ORAL_TABLET | ORAL | Status: DC | PRN
Start: 2023-12-12 — End: 2023-12-13
  Filled 2023-12-12 (×2): qty 1

## 2023-12-12 NOTE — TOC Initial Note (Signed)
 Transition of Care [facility]) - Initial/Assessment Note    Patient Details  Name: Cory Hess MRN: [id number] Date of Birth: [birth year]  Transition of Care [facility]) CM/SW Contact:    Cory JINNY Ruts, LCSW Phone Number: 12/12/2023, 11:25 AM  Clinical Narrative:                 Chart reviewed. The patient was admitted for Acute Blood loss Anemia. I spoke with the patient at bedside today. I introduced myself, my role, and reason for consult.   The patient reports that he was doing well today. The patient reports that his PCP is [facility]. The patient reports that he lives alone. The patient reports that he was able to complete daily living task independently and drove himself to medical appointments.   The patient reports that his sister will be assisting him during discharge. The patient reports that he uses Adult nurse. The patient reports that he has had HH in February of this year but does not remember who the [facility] agency was.   The patient reports that he was recently a resident of [facility] and he was only there for 2 weeks and a half. The patient reports that he uses a cane.  There are no TOC needs at this time. TOC will follow the patient until discharge.     Barriers to Discharge: Continued Medical Work up   Patient Goals and CMS Choice            Expected Discharge Plan and Services       Living arrangements for the past 2 months: Single Family Home                                      Prior Living Arrangements/Services Living arrangements for the past 2 months: Single Family Home Lives with:: Self Patient language and need for interpreter reviewed:: Yes        Need for Family Participation in Patient Care: Yes (Comment)     Criminal Activity/Legal Involvement Pertinent to Current Situation/Hospitalization: No - Comment as needed  Activities of Daily Living   ADL Screening (condition at time of admission) Independently performs ADLs?: Yes  (appropriate for developmental age) Is the patient deaf or have difficulty hearing?: No Does the patient have difficulty seeing, even when wearing glasses/contacts?: No Does the patient have difficulty concentrating, remembering, or making decisions?: No  Permission Sought/Granted                  Emotional Assessment   Attitude/Demeanor/Rapport: Gracious, Engaged Affect (typically observed): Calm, Pleasant Orientation: : Oriented to Self, Oriented to Situation, Oriented to Place, Oriented to  Time Alcohol / Substance Use: Not Applicable Psych Involvement: No (comment)  Admission diagnosis:  Acute blood loss anemia [D62] Pulmonary nodule [R91.1] Near syncope [R55] Patient Active Problem List   Diagnosis Date Noted   Cardiogenic shock (HCC) 12/10/2023   Acute blood loss anemia 12/09/2023   Near syncope 12/09/2023   Carotid stenosis 10/18/2023   Open arm wound, right, sequela 08/19/2023   Abscess of right arm 08/02/2023   Left inguinal hernia 08/02/2023   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   Acquired hypothyroidism 08/02/2023   Essential hypertension    Blood glucose elevated 07/06/2023   Does not have primary care provider 07/01/2023   Thrush 07/01/2023   Former smoker 07/01/2023   Pulmonary embolus (HCC) 04/15/2023   Acute respiratory  failure with hypoxia and hypercapnia (HCC) 04/10/2023   Acute CVA (cerebrovascular accident) (HCC) 04/09/2023   Hyperkalemia 04/06/2023   Acute kidney injury 04/06/2023   Cardiac arrest (HCC) 04/05/2023   CHB (complete heart block) (HCC) 04/05/2023   Acute on chronic HFrEF (heart failure with reduced ejection fraction) (HCC) 04/05/2023   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   Hematuria 11/06/2018   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   Coronary artery disease 08/28/2017   STEMI (ST elevation myocardial infarction) [facility])    PCP:  Towana Small, FNP Pharmacy:   [facility] [address], [place] - [address] [place] [address] Phone: [phone] Fax: [phone]     Social Drivers of Health (SDOH) Social History: SDOH Screenings   Food Insecurity: No Food Insecurity (12/09/2023)  Housing: Low Risk  (12/09/2023)  Transportation Needs: No Transportation Needs (12/09/2023)  Utilities: Not At Risk (12/09/2023)  Depression (PHQ2-9): Low Risk  (09/13/2023)  Financial Resource Strain: Low Risk  (11/06/2018)  Physical Activity: Inactive (11/06/2018)  Social Connections: Moderately Integrated (12/09/2023)  Stress: No Stress Concern Present (11/06/2018)  Tobacco Use: Medium Risk (12/09/2023)   SDOH Interventions:     Readmission Risk Interventions    12/12/2023   11:24 AM  Readmission Risk Prevention Plan  Transportation Screening Complete  PCP or Specialist Appt within 3-5 Days Complete  Social Work Consult for Recovery Care Planning/Counseling Complete  Palliative Care Screening Not Applicable  Medication Review Oceanographer) Complete

## 2023-12-12 NOTE — Consult Note (Signed)
 PHARMACY CONSULT NOTE - FOLLOW UP  Pharmacy Consult for Electrolyte Monitoring and Replacement   Recent Labs: Potassium (mmol/L)  Date Value  12/11/2023 4.7   Magnesium  (mg/dL)  Date Value  89/81/7974 2.1   Calcium  (mg/dL)  Date Value  89/80/7974 8.7 (L)   Albumin  (g/dL)  Date Value  89/82/7974 3.3 (L)  07/06/2023 4.0   Phosphorus (mg/dL)  Date Value  92/98/7974 3.2   Sodium (mmol/L)  Date Value  12/11/2023 139  07/06/2023 142     Assessment: DB is a 78yoM that presented with lightheadedness and near syncopal episode. Patient has a past medical history notable for CAD s/p CABG, PAF on eliquis , HTN, alternating bundle branch block, ischemic cardiomyopathy, CHF (12/11/23 EF 55-60%), and hypothyroidism. Pharmacy has been consulted for electrolyte replacement.  Renal function is stable with uptrend from baseline (~1.1-1.2).  Goal of Therapy:  Electrolytes wnl  Plan:  No replacement at this time Monitor electrolytes with AM labs (BMP, Mag, Phos)  Leonor JAYSON Argyle ,PharmD 12/12/2023 7:21 AM

## 2023-12-12 NOTE — Care Management Important Message (Signed)
 Important Message  Patient Details  Name: Cory Hess MRN: [id number] Date of Birth: 1945/04/19   Important Message Given:  Yes - Medicare IM     Cory Hess 12/12/2023, 12:52 PM

## 2023-12-12 NOTE — Progress Notes (Signed)
 Progress Note  Patient Name: Cory Hess Date of Encounter: 12/12/2023  Primary Cardiologist: End  Subjective   He feels well with no chest pain, shortness of breath or palpitation.  He has not required any ventricular pacing over the last 24 hours.   Inpatient Medications    Scheduled Meds:  atorvastatin   80 mg Oral QHS   Chlorhexidine  Gluconate Cloth  6 each Topical Q0600   famotidine   20 mg Oral Daily   levothyroxine   100 mcg Oral QAC breakfast   multivitamin with minerals  1 tablet Oral Daily   Continuous Infusions:  PRN Meds: acetaminophen  **OR** acetaminophen , hydrALAZINE , ondansetron  **OR** ondansetron  (ZOFRAN ) IV, senna-docusate   Vital Signs    Vitals:   12/12/23 0800 12/12/23 0900 12/12/23 1000 12/12/23 1100  BP: 135/74 132/76 132/76 137/71  Pulse: 67 76 75 77  Resp: 17 16 12 18   Temp:      TempSrc: Axillary     SpO2: 93% 96% 97% 94%  Weight:      Height:        Intake/Output Summary (Last 24 hours) at 12/12/2023 1333 Last data filed at 12/12/2023 1300 Gross per 24 hour  Intake 1930.78 ml  Output 3150 ml  Net -1219.22 ml   Filed Weights   12/09/23 1212 12/09/23 2159 12/10/23 1130  Weight: 94.3 kg 94.7 kg 96.4 kg    Telemetry    Sinus rhythm with no ventricular pacing over the last 24 hours.- Personally Reviewed  ECG    No new tracings - Personally Reviewed  Physical Exam   GEN: No acute distress.   Neck: No JVD. Cardiac: RRR, no murmurs, rubs, or gallops. Right femoral venous pacer insertion site without active bleeding, warmth, erythema, or TTP.  Respiratory: Clear to auscultation bilaterally.  GI: Soft, nontender, non-distended.   MS: No edema; No deformity. Neuro:  Alert and oriented x 3; Nonfocal.  Psych: Normal affect.  Labs    Chemistry Recent Labs  Lab 12/09/23 1313 12/10/23 0526 12/11/23 0432  NA 138 136 139  K 4.1 3.9 4.7  CL 101 100 105  CO2 27 26 23   GLUCOSE 111* 105* 100*  BUN 24* 21 25*  CREATININE  1.16 1.13 1.35*  CALCIUM  8.5* 8.8* 8.7*  PROT 6.3*  --   --   ALBUMIN  3.3*  --   --   AST 38  --   --   ALT 30  --   --   ALKPHOS 64  --   --   BILITOT 1.0  --   --   GFRNONAA >60 >60 54*  ANIONGAP 10 10 11      Hematology Recent Labs  Lab 12/09/23 1313 12/10/23 0526 12/11/23 0432  WBC 6.7 5.8 6.2  RBC 3.23* 3.27* 3.19*  HGB 10.2* 10.3* 10.0*  HCT 30.8* 30.6* 30.5*  MCV 95.4 93.6 95.6  MCH 31.6 31.5 31.3  MCHC 33.1 33.7 32.8  RDW 12.6 12.7 12.5  PLT 151 154 147*    Cardiac EnzymesNo results for input(s): TROPONINI in the last 168 hours. No results for input(s): TROPIPOC in the last 168 hours.   BNPNo results for input(s): BNP, PROBNP in the last 168 hours.   DDimer No results for input(s): DDIMER in the last 168 hours.   Radiology    CT ANGIO NECK W OR WO CONTRAST Result Date: 12/10/2023 IMPRESSION: 1. Mixed atherosclerotic plaque at the right carotid bifurcation and bulb resulting in approximately 55% stenosis of the right ICA origin. 2.  Moderate stenosis at the right vertebral artery origin. 3. Additional atherosclerosis as above. 4. 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 Electronically signed by: Donnice Mania MD 12/10/2023 10:55 AM EDT RP Workstation: [id number]   CT Angio Abd/Pel W and/or Wo Contrast Result Date: 12/09/2023 IMPRESSION: 1. Sizable left groin hematoma but no extravasation of contrast material to suggest active bleeding. 2. Sizable ulcerated plaque or focal dissection involving the posterior aspect of the lower abdominal aorta on the left side measuring approximately 2.2 cm. 3. No acute abdominal/pelvic findings, mass lesions or adenopathy. 4. Enlarged prostate gland. 5. Aortic atherosclerosis. Aortic Atherosclerosis (ICD10-I70.0). Electronically Signed   By: MYRTIS Stammer M.D.   On: 12/09/2023 18:02   CT Angio Chest PE W and/or Wo Contrast Result Date: 12/09/2023 IMPRESSION: 1. No embolism to the proximal subsegmental  pulmonary artery level. No lung mass, consolidation, pleural effusion or pneumothorax. 2. There is a new 5 x 6 mm ground-glass nodule in the right upper lobe. Consider short-term follow-up examination in 3 months to document resolution versus stability. 3. Multiple other nonacute observations, as described above. Aortic Atherosclerosis (ICD10-I70.0). Electronically Signed   By: Ree Molt M.D.   On: 12/09/2023 15:51    Cardiac Studies   Limited echo 04/16/2023: 1. Left ventricular ejection fraction, by estimation, is 55 to 60%. Left  ventricular ejection fraction by 2D MOD biplane is 56.9 %. The left  ventricle has normal function. The left ventricle has no regional wall  motion abnormalities. There is mild left  ventricular hypertrophy.   2. Right ventricular systolic function is normal. The right ventricular  size is normal.  __________   2D echo 04/07/2023: 1. Very challenging images, definity  not used   2. Left ventricular ejection fraction, by estimation, is 40 to 45 %. The  left ventricle has mildly decreased function. The left ventricle  demonstrates regional wall motion abnormalities (images suggest  anteroseptal, inferior/posterior and apical  hypokinesis, possibly anterior wall hypokinesis). There is mild left  ventricular hypertrophy. Left ventricular diastolic parameters are  consistent with Grade I diastolic dysfunction (impaired relaxation).   3. Right ventricular systolic function was not well visualized. The right  ventricular size is normal.   4. The mitral valve is normal in structure. No evidence of mitral valve  regurgitation. No evidence of mitral stenosis.   5. The aortic valve is normal in structure. There is mild calcification  of the aortic valve. Aortic valve regurgitation is not visualized. Aortic  valve sclerosis/calcification is present, without any evidence of aortic  stenosis. Aortic valve mean  gradient measures 2.0 mmHg.  __________   [facility]  04/05/2023: Conclusions: Severe native coronary artery disease with occluded LMCA and diffusely diseased RCA with up to 95% stenosis. Widely patent LIMA-LAD and SVG-LCx. Occluded SVG-PDA, likely chronic. Severely reduced left ventricular systolic function (LVEF 25-30%) with anterior hypokinesis/akinesis. Mildly to moderately elevated left heart filling pressures (LVEDP 20 mmHg; PCWP 25 mmHg). Moderately to severely elevated right heart filling pressures (mean RA 15 mmHg, RV 40/15 mmHg).  PAPi of 1 suggests an element of RV dysfunction. Successful placement of temporary transvenous pacemaker via the right femoral vein.   Recommendations: No obvious culprit lesion identified for the patient's cardiac arrest, marked bradycardia/complete heart block, cardiogenic shock, and acute on chronic HFrEF.  Continue secondary prevention of CAD. Significant hypoxia noted throughout catheterization despite aggressive oxygenation with the ventilator.  Question if some other process such as pulmonary embolism or intrinsic lung disease could be contributing.  Recommend empiric  heparin  to be given 2 hours after right femoral artery hemostasis was achieved.  Further workup per critical care team. Obtain stat portable chest x-ray when patient arrives in the ICU to confirm location of temporary transvenous pacing wire and pulmonary artery catheter. Continue gentle diuresis as blood pressure allows. Wean epinephrine  infusion. __________   See CV Studies in Epic for more remote imaging  Patient Profile     78 y.o. male with history of CAD status post CABG x 3 and 08/2016, HFrEF secondary to ICM, PE with PEA arrest in 03/2023, alternating bundle branch block, carotid artery disease status post recent right ICA stenting on 10/26/2023, HTN, HLD, bladder tumor status post TURBT, tongue cancer status post chemoradiation who is being seen today for the evaluation of symptomatic bradycardia at the request of Dr. Dorinda.    Assessment & Plan    1. 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 He is now in sinus rhythm and has not required pacing over the last 24 hours. I remove the temporary pacemaker today. Continue to hold off any medications that can cause bradycardia. Can transfer to telemetry today and if stable, can likely discharge home tomorrow with a 2-week ZIO monitor.    2.  CAD status post CABG without angina: - No symptoms suggestive of angina - High-sensitivity troponin negative x 2 this admission - Atorvastatin  80 mg - No plans for inpatient ischemic evaluation at this time   3.  HFrEF: - Appears euvolemic and well compensated - Hold carvedilol  as outlined above - Echo yesterday showed normal LV systolic function. Can resume losartan  for blood pressure control if needed.  This was held due to hypotension on presentation.   4.  History of PEA arrest: - In the setting of pulmonary embolism status post thrombectomy and OAC   5.  Carotid artery disease: - Status post right ICA stenting in 10/2023 - Unlikely contributing to bradycardic episodes currently experienced given timeframe since intervention - Management per vascular surgery        For questions or updates, please contact [facility] Please consult [email or web address] for contact info under Cardiology/STEMI.    Signed, Deatrice Cage, MD [facility] 12/12/2023, 1:33 PM

## 2023-12-12 NOTE — Progress Notes (Addendum)
 1430 Ask for prn for pain in his groin. Consulted with Dr. Isaiah. Order received. 1445 Pain medication taken to patient's room. Patient sleeping. PRN not given. 1600 Patient awake but no complaints of pain 1800 Remains alert and oriented.

## 2023-12-12 NOTE — Progress Notes (Signed)
 Right femoral venous sheath removed.    Prior to sheath removal, right femoral site level 0 Pressure held for 15 minutes post sheath removal.  Post sheath removal, right femoral site level 0. Patient educated on post sheath removal precautions and voiced his understanding in his own words. Primary RN Myra Flowers assessed groin site post sheath removal.

## 2023-12-13 ENCOUNTER — Inpatient Hospital Stay (HOSPITAL_BASED_OUTPATIENT_CLINIC_OR_DEPARTMENT_OTHER)
Admit: 2023-12-13 | Discharge: 2023-12-13 | Disposition: A | Discharge status: Home / Self Care | Attending: Physician Assistant | Admitting: Physician Assistant

## 2023-12-13 DIAGNOSIS — I442 Atrioventricular block, complete: Secondary | ICD-10-CM

## 2023-12-13 DIAGNOSIS — R55 Syncope and collapse: Secondary | ICD-10-CM

## 2023-12-13 DIAGNOSIS — D62 Acute posthemorrhagic anemia: Secondary | ICD-10-CM | POA: Diagnosis not present

## 2023-12-13 LAB — PHOSPHORUS: Phosphorus: 3.3 mg/dL (ref 2.5–4.6)

## 2023-12-13 LAB — BASIC METABOLIC PANEL WITH GFR
Anion gap: 7 (ref 5–15)
BUN: 20 mg/dL (ref 8–23)
CO2: 26 mmol/L (ref 22–32)
Calcium: 8.7 mg/dL — ABNORMAL LOW (ref 8.9–10.3)
Chloride: 104 mmol/L (ref 98–111)
Creatinine, Ser: 1.13 mg/dL (ref 0.61–1.24)
GFR, Estimated: >60 mL/min (ref >60)
Glucose, Bld: 105 mg/dL — ABNORMAL HIGH (ref 70–99)
Potassium: 4 mmol/L (ref 3.5–5.1)
Sodium: 137 mmol/L (ref 135–145)

## 2023-12-13 LAB — CBC
HCT: 33.4 % — ABNORMAL LOW (ref 39.0–52.0)
Hemoglobin: 11.1 g/dL — ABNORMAL LOW (ref 13.0–17.0)
MCH: 31.6 pg (ref 26.0–34.0)
MCHC: 33.2 g/dL (ref 30.0–36.0)
MCV: 95.2 fL (ref 80.0–100.0)
Platelets: 153 K/uL (ref 150–400)
RBC: 3.51 MIL/uL — ABNORMAL LOW (ref 4.22–5.81)
RDW: 12.5 % (ref 11.5–15.5)
WBC: 6.8 K/uL (ref 4.0–10.5)
nRBC: 0 % (ref 0.0–0.2)

## 2023-12-13 LAB — MAGNESIUM: Magnesium: 2.2 mg/dL (ref 1.7–2.4)

## 2023-12-13 MED ORDER — ALUM & MAG HYDROXIDE-SIMETH 200-200-20 MG/5ML PO SUSP
30.0000 mL | ORAL | Status: DC | PRN
  Administered 2023-12-13: 30 mL via ORAL
  Filled 2023-12-13: qty 30

## 2023-12-13 NOTE — Discharge Summary (Signed)
 Cory Hess FMW:[id number] DOB: [birth year] DOA: 12/09/2023   PCP: Towana Small, FNP   Admit date: 12/09/2023 Discharge date: 12/13/2023   Time spent: 35 minutes   Recommendations for Outpatient Follow-up:  Pcp f/u Cardiology f/u  Gen surg f/u Vascular surgery f/u Ensure compliance with levothyroxine  and repeat TFTs in 4-6 weeks       Discharge Diagnoses:  Principal Problem:   Acute blood loss anemia Active Problems:   Hx of CABG   Coronary artery disease   Near syncope   Essential hypertension   Acquired hypothyroidism   CHB (complete heart block) (HCC)   Cardiogenic shock (HCC)     Discharge Condition: stable   Diet recommendation: heart healthy        Filed Weights    12/09/23 1212 12/09/23 2159 12/10/23 1130  Weight: 94.3 kg 94.7 kg 96.4 kg      History of present illness:  From admission h and p Cory Hess is a 78 year old male with hypothyroid, hypertension, paroxysmal atrial fibrillation on Eliquis , recent left inguinal hernia repair 10/13, who presents ED for chief concerns of near syncope while sitting at [facility].   At bedside, patient was able to tell me his first last name, age, location, current calendar year.   He reports he had breakfast at 6 AM on the same day of presentation.  At breakfast, he had grits with sugar and several many muffins.   At around 9:30 AM, he went to [employer] to meet with some old time friends.  They hung out at [facility].  He took 1 dose of oxycodone  that was prescribed to him due to the hernia repair surgery.  He did not eat with this medication.   At approximately 1130, while at [facility], he developed dizziness and he felt like his eyes rolling in the back of his head and extreme weakness.  He denies loss of consciousness and states he was aware the entire time.  He reports this is never happened before.  He reports this feels different from other episodes of vertigo that he has been diagnosed.   He  denies known trauma to his person.  He reports that his left groin has had increased swelling since surgery especially on the 2nd and 3rd day postop.   Hospital Course:    Patient presents after a near syncopal event. Found to have complete heart block requiring temporary pacemaker insertion. Now s/p that temporary pacemaker, ziopatch placed by cardiology, plan for outpatient follow-up with holding of patient's coreg  and all av nodal agents. Patient is pod8 from elective repair of left inguinal hernia. He has had swelling and bruising in that area and CT shows a hematoma. Evaluated by vascular surgery, as no signs active bleeding they advised no intervention. Reviewed findings with Dr. Marinda of gen surg who says save to resume patient's anticoagulation so will do so. Patient is also noted to have elevated TSH in setting of poor compliance with levothyroxine , advise compliance and repeat TFTs in 4-6 weeks.    Procedures: Temp venous pacemaker placement   Consultations: cardiology   Discharge Exam:     Vitals:    12/13/23 0600 12/13/23 0700  BP: 134/72 (!) 116/58  Pulse: 64 65  Resp: 15 11  Temp:      SpO2: 99% 94%      General: NAD Cardiovascular: RR Respiratory: CTAB Ext: warm, no edema   Discharge Instructions     Discharge Instructions       Diet - low  sodium heart healthy   Complete by: As directed      Discharge wound care:   Complete by: As directed      Follow prior wound care instructions    Increase activity slowly   Complete by: As directed           Allergies as of 12/13/2023   No Known Allergies         Medication List       PAUSE taking these medications     losartan  25 MG tablet Wait to take this until your doctor or other care provider tells you to start again. Commonly known as: COZAAR  Take 1 tablet (25 mg total) by mouth daily.           STOP taking these medications     carvedilol  6.25 MG tablet Commonly known as: COREG             TAKE these medications     acetaminophen  500 MG tablet Commonly known as: TYLENOL  Take 1,000 mg by mouth in the morning and at bedtime.    apixaban  5 MG Tabs tablet Commonly known as: ELIQUIS  Take 1 tablet (5 mg total) by mouth 2 (two) times daily.    aspirin  81 MG chewable tablet Place 1 tablet (81 mg total) into feeding tube daily.    atorvastatin  80 MG tablet Commonly known as: LIPITOR  Place 1 tablet (80 mg total) into feeding tube daily. What changed: how to take this    Blood Pressure Kit Kit 1 each by Does not apply route in the morning, at noon, and at bedtime.    famotidine  20 MG tablet Commonly known as: PEPCID  Place 1 tablet (20 mg total) into feeding tube daily. What changed: how to take this    levothyroxine  100 MCG tablet Commonly known as: SYNTHROID  Take 1 tablet (100 mcg total) by mouth daily before breakfast.    multivitamin with minerals Tabs tablet Take 1 tablet by mouth daily.    oxyCODONE  5 MG immediate release tablet Commonly known as: Oxy IR/ROXICODONE  Take 1 tablet (5 mg total) by mouth every 6 (six) hours as needed for severe pain (pain score 7-10).    oxyCODONE  5 MG immediate release tablet Commonly known as: Roxicodone  Take 1 tablet (5 mg total) by mouth every 8 (eight) hours as needed.                        Discharge Care Instructions  (From admission, onward)                 Start     Ordered    12/13/23 0000   Discharge wound care:       Comments: Follow prior wound care instructions   12/13/23 1509                Allergies  No Known Allergies         The results of significant diagnostics from this hospitalization (including imaging, microbiology, ancillary and laboratory) are listed below for reference.     Significant Diagnostic Studies:  Imaging Results  ECHOCARDIOGRAM COMPLETE Result Date: 12/11/2023    ECHOCARDIOGRAM REPORT   Patient Name:   Cory Hess Date of Exam: 12/11/2023 Medical Rec #:   [id number]       Height:       72.0 in Accession #:    [id number]      Weight:       212.5 lb Date  of Birth:  1946-01-17       BSA:          2.186 m Patient Age:    78 years        BP:           115/56 mmHg Patient Gender: M               HR:           71 bpm. Exam Location:  [facility] Procedure: 2D Echo, Cardiac Doppler and Color Doppler (Both Spectral and Color            Flow Doppler were utilized during procedure). Indications:     Heart block, Complete I44.2  History:         Patient has prior history of Echocardiogram examinations. CAD.  Sonographer:     Bari Roar Referring Phys:  [id number] RYAN M DUNN Diagnosing Phys: Annabella Scarce MD IMPRESSIONS  1. Left ventricular ejection fraction, by estimation, is 55 to 60%. The left ventricle has normal function. The left ventricle has no regional wall motion abnormalities. There is moderate concentric left ventricular hypertrophy. Left ventricular diastolic parameters are consistent with Grade I diastolic dysfunction (impaired relaxation).  2. Right ventricular systolic function is normal. The right ventricular size is normal.  3. Left atrial size was mildly dilated.  4. The mitral valve is normal in structure. Trivial mitral valve regurgitation. No evidence of mitral stenosis.  5. The aortic valve is tricuspid. Aortic valve regurgitation is not visualized. No aortic stenosis is present.  6. The inferior vena cava is normal in size with greater than 50% respiratory variability, suggesting right atrial pressure of 3 mmHg. FINDINGS  Left Ventricle: Left ventricular ejection fraction, by estimation, is 55 to 60%. The left ventricle has normal function. The left ventricle has no regional wall motion abnormalities. The left ventricular internal cavity size was normal in size. There is  moderate concentric left ventricular hypertrophy. Left ventricular diastolic parameters are consistent with Grade I diastolic dysfunction (impaired relaxation). Indeterminate filling pressures.  Right Ventricle: The right ventricular size is normal. No increase in right ventricular wall thickness. Right ventricular systolic function is normal. Left Atrium: Left atrial size was mildly dilated. Right Atrium: Right atrial size was normal in size. Pericardium: There is no evidence of pericardial effusion. Mitral Valve: The mitral valve is normal in structure. Trivial mitral valve regurgitation. No evidence of mitral valve stenosis. MV peak gradient, 5.7 mmHg. The mean mitral valve gradient is 2.0 mmHg. Tricuspid Valve: The tricuspid valve is normal in structure. Tricuspid valve regurgitation is trivial. No evidence of tricuspid stenosis. Aortic Valve: The aortic valve is tricuspid. Aortic valve regurgitation is not visualized. No aortic stenosis is present. Aortic valve mean gradient measures 2.0 mmHg. Aortic valve peak gradient measures 4.4 mmHg. Aortic valve area, by VTI measures 2.09 cm. Pulmonic Valve: The pulmonic valve was normal in structure. Pulmonic valve regurgitation is not visualized. No evidence of pulmonic stenosis. Aorta: The aortic root is normal in size and structure. Venous: The inferior vena cava is normal in size with greater than 50% respiratory variability, suggesting right atrial pressure of 3 mmHg. IAS/Shunts: No atrial level shunt detected by color flow Doppler.  LEFT VENTRICLE PLAX 2D LVIDd:         4.60 cm   Diastology LVIDs:         3.20 cm   LV e' medial:    7.62 cm/s LV PW:  1.20 cm   LV E/e' medial:  10.8 LV IVS:        1.40 cm   LV e' lateral:   11.40 cm/s LVOT diam:     1.90 cm   LV E/e' lateral: 7.2 LV SV:         41 LV SV Index:   19 LVOT Area:     2.84 cm  RIGHT VENTRICLE RV Basal diam:  3.60 cm RV Mid diam:    3.20 cm RV S prime:     10.40 cm/s TAPSE (M-mode): 2.1 cm LEFT ATRIUM             Index        RIGHT ATRIUM           Index LA diam:        4.30 cm 1.97 cm/m   RA Area:     20.20 cm LA Vol (A2C):   66.3 ml 30.33 ml/m  RA Volume:   56.70 ml  25.93 ml/m LA  Vol (A4C):   67.0 ml 30.65 ml/m LA Biplane Vol: 69.3 ml 31.70 ml/m  AORTIC VALVE                    PULMONIC VALVE AV Area (Vmax):    2.09 cm     PV Vmax:        1.48 m/s AV Area (Vmean):   1.82 cm     PV Peak grad:   8.8 mmHg AV Area (VTI):     2.09 cm     RVOT Peak grad: 2 mmHg AV Vmax:           105.00 cm/s AV Vmean:          72.700 cm/s AV VTI:            0.194 m AV Peak Grad:      4.4 mmHg AV Mean Grad:      2.0 mmHg LVOT Vmax:         77.50 cm/s LVOT Vmean:        46.600 cm/s LVOT VTI:          0.143 m LVOT/AV VTI ratio: 0.74  AORTA Ao Root diam: 3.10 cm Ao Asc diam:  3.60 cm MITRAL VALVE                TRICUSPID VALVE MV Area (PHT): 4.44 cm     TR Peak grad:   21.7 mmHg MV Area VTI:   1.61 cm     TR Vmax:        233.00 cm/s MV Peak grad:  5.7 mmHg MV Mean grad:  2.0 mmHg     SHUNTS MV Vmax:       1.19 m/s     Systemic VTI:  0.14 m MV Vmean:      63.7 cm/s    Systemic Diam: 1.90 cm MV Decel Time: 171 msec MV E velocity: 82.00 cm/s MV A velocity: 120.00 cm/s MV E/A ratio:  0.68 MV A Prime:    17.3 cm/s Annabella Scarce MD Electronically signed by Annabella Scarce MD Signature Date/Time: 12/11/2023/10:51:20 AM    Final     CT ANGIO NECK W OR WO CONTRAST Result Date: 12/10/2023 EXAM: CTA Neck 12/10/2023 10:39:42 AM TECHNIQUE: CT of the neck was performed without and with the administration of 75 mL of iohexol  (OMNIPAQUE ) 350 MG/ML injection. Multiplanar 2D and/or 3D reformatted images are provided for review. Automated exposure control, iterative reconstruction,  and/or weight based adjustment of the mA/kV was utilized to reduce the radiation dose to as low as reasonably achievable. Stenosis of the internal carotid arteries measured using NASCET criteria. COMPARISON: None available CLINICAL HISTORY: Syncope/presyncope, cerebrovascular cause suspected. FINDINGS: AORTIC ARCH AND ARCH VESSELS: Mild atherosclerosis of the aortic arch. No dissection or arterial injury. No significant stenosis of the  brachiocephalic or subclavian arteries. CERVICAL CAROTID ARTERIES: The right carotid artery is patent from the origin to the skull base. Mixed atherosclerotic plaque at the carotid bifurcation and right carotid bulb results in approximately 55% stenosis of the right ICA origin. There is an additional focal prominent region of noncalcified atherosclerotic plaque in the proximal right cervical ICA with a similar degree of narrowing. The left carotid artery is patent from the origin to the skull base. Atherosclerosis at the left carotid bifurcation without hemodynamically significant stenosis. No dissection or arterial injury. CERVICAL VERTEBRAL ARTERIES: Vertebral arteries are patent from the origins to the vertebrobasilar confluence. Atherosclerosis at the right vertebral artery origin results in moderate stenosis. There is additional mild stenosis at the left vertebral artery origin. Tortuosity of the left V1 segment. The intracranial vertebral arteries are patent. There is an atretic appearance of the post PICA segment of the left vertebral artery with significant distal tapering of the left V4 segment without evidence of occlusion. No dissection or arterial injury. LUNGS AND MEDIASTINUM: Right chest wall port-a-cath is partially visualized. Sequelae of median sternotomy. Lungs are unremarkable. SOFT TISSUES: Bilateral lens replacement. Mucosal thickening and mucous retention cyst in the left maxillary sinus. No acute abnormality. BONES: No acute abnormality. LIMITATIONS/ARTIFACTS AND INCIDENTAL INTRACRANIAL FINDINGS: Limited visualization of intracranial structures. There is a small remote infarct noted within the superior right cerebellum. IMPRESSION: 1. Mixed atherosclerotic plaque at the right carotid bifurcation and bulb resulting in approximately 55% stenosis of the right ICA origin. 2. Moderate stenosis at the right vertebral artery origin. 3. Additional atherosclerosis as above. 4. 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 Electronically signed by: Donnice Mania MD 12/10/2023 10:55 AM EDT RP Workstation: [id number]    CT Angio Abd/Pel W and/or Wo Contrast Result Date: 12/09/2023 CLINICAL DATA:  History of left inguinal hernia repair with hematoma. Suspicion for active bleeding. EXAM: CTA ABDOMEN AND PELVIS WITHOUT AND WITH CONTRAST TECHNIQUE: Multidetector CT imaging of the abdomen and pelvis was performed using the standard protocol during bolus administration of intravenous contrast. Multiplanar reconstructed images and MIPs were obtained and reviewed to evaluate the vascular anatomy. RADIATION DOSE REDUCTION: This exam was performed according to the departmental dose-optimization program which includes automated exposure control, adjustment of the mA and/or kV according to patient size and/or use of iterative reconstruction technique. CONTRAST:  OMNIPAQUE  IOHEXOL  350 MG/ML SOLN COMPARISON:  Chest CT scan earlier, same date. Prior abdominal CT scan and 07/17/2023 FINDINGS: VASCULAR Aorta: Stable moderate to advanced age related atherosclerotic calcifications but no aneurysm. Sizable ulcerated plaque or focal dissection noted involving the posterior aspect of the lower abdominal aorta on the left side measuring approximately 2.2 cm. Celiac: Atherosclerotic calcifications but no significant stenosis or dissection. SMA: Atherosclerotic calcifications but no significant stenosis. Renals: 2 renal arteries bilaterally. Two right and 3 left renal arteries no significant stenosis. Scattered atherosclerotic calcifications. IMA: Patent Inflow: Moderate atherosclerotic calcifications but no stenosis or dissection. Proximal Outflow: No significant findings. Scattered calcifications. Veins: No significant findings. Review of the MIP images confirms the above findings. NON-VASCULAR Lower chest: No acute pulmonary findings. Pulmonary scarring changes. Hepatobiliary: No hepatic  lesions or intrahepatic biliary  dilatation. Gallbladder is unremarkable. No common bile duct dilatation. Pancreas: Unremarkable. No pancreatic ductal dilatation or surrounding inflammatory changes. Spleen: Normal in size without focal abnormality. Adrenals/Urinary Tract: Adrenal glands and kidneys are unremarkable. There is contrast in the renal collecting systems from prior CT scan. Bilateral renal cysts, unchanged. The bladder is unremarkable. Stomach/Bowel: The stomach, duodenum, small bowel and colon are grossly normal without oral contrast. No inflammatory changes, mass lesions or obstructive findings. The appendix is normal. Lymphatic: No abdominal or pelvic lymphadenopathy. Reproductive: Enlarged prostate gland. The seminal vesicles are unremarkable. Other: There is a sizable left groin hematoma measuring approximately 7.8 x 2.7 cm. This is anterior to the inguinal canal. No extravasation of contrast material to suggest active bleeding. Small tortuous vessels in the spermatic cords bilaterally. Musculoskeletal: No significant bony findings. IMPRESSION: 1. Sizable left groin hematoma but no extravasation of contrast material to suggest active bleeding. 2. Sizable ulcerated plaque or focal dissection involving the posterior aspect of the lower abdominal aorta on the left side measuring approximately 2.2 cm. 3. No acute abdominal/pelvic findings, mass lesions or adenopathy. 4. Enlarged prostate gland. 5. Aortic atherosclerosis. Aortic Atherosclerosis (ICD10-I70.0). Electronically Signed   By: MYRTIS Stammer M.D.   On: 12/09/2023 18:02    CT Angio Chest PE W and/or Wo Contrast Result Date: 12/09/2023 CLINICAL DATA:  Pulmonary embolism (PE) suspected, high prob. Near syncopal episode. History of tongue carcinoma. * Tracking Code: BO * EXAM: CT ANGIOGRAPHY CHEST WITH CONTRAST TECHNIQUE: Multidetector CT imaging of the chest was performed using the standard protocol during bolus administration of intravenous contrast. Multiplanar CT image  reconstructions and MIPs were obtained to evaluate the vascular anatomy. RADIATION DOSE REDUCTION: This exam was performed according to the departmental dose-optimization program which includes automated exposure control, adjustment of the mA and/or kV according to patient size and/or use of iterative reconstruction technique. CONTRAST:  75mL OMNIPAQUE  IOHEXOL  350 MG/ML SOLN COMPARISON:  CT angiography chest from 07/17/2023. FINDINGS: Cardiovascular: No evidence of embolism to the proximal subsegmental pulmonary artery level. Mild cardiomegaly. No pericardial effusion. No aortic aneurysm. There are coronary artery calcifications, in keeping with coronary artery disease. There are also mild peripheral atherosclerotic vascular calcifications of thoracic aorta and its major branches. Mediastinum/Nodes: Visualized thyroid  gland appears grossly unremarkable. No solid / cystic mediastinal masses. The esophagus is nondistended precluding optimal assessment. No axillary, mediastinal or hilar lymphadenopathy by size criteria. Lungs/Pleura: The central tracheo-bronchial tree is patent. There is mild, smooth, circumferential thickening of the segmental and subsegmental bronchial walls, throughout bilateral lungs, which is nonspecific. Findings are most commonly seen with bronchitis or reactive airway disease, such as asthma. There are dependent changes in bilateral lungs. No mass or consolidation. No pleural effusion or pneumothorax. There is a new 5 x 6 mm ground-glass nodule in the right upper lobe (series 5, image 62). Upper Abdomen: Visualized upper abdominal viscera within normal limits. Musculoskeletal: The visualized soft tissues of the chest wall are grossly unremarkable. No suspicious osseous lesions. There are mild to moderate multilevel degenerative changes in the visualized spine. Review of the MIP images confirms the above findings. IMPRESSION: 1. No embolism to the proximal subsegmental pulmonary artery level. No  lung mass, consolidation, pleural effusion or pneumothorax. 2. There is a new 5 x 6 mm ground-glass nodule in the right upper lobe. Consider short-term follow-up examination in 3 months to document resolution versus stability. 3. Multiple other nonacute observations, as described above. Aortic Atherosclerosis (ICD10-I70.0). Electronically Signed   By:  Ree Molt M.D.   On: 12/09/2023 15:51       Microbiology:        Recent Results (from the past 240 hours)  MRSA Next Gen by PCR, Nasal     Status: None    Collection Time: 12/10/23 11:33 AM    Specimen: Nasal Mucosa; Nasal Swab  Result Value Ref Range Status    MRSA by PCR Next Gen NOT DETECTED NOT DETECTED Final      Comment: (NOTE) The GeneXpert MRSA Assay (FDA approved for NASAL specimens only), is one component of a comprehensive MRSA colonization surveillance program. It is not intended to diagnose MRSA infection nor to guide or monitor treatment for MRSA infections. Test performance is not FDA approved in patients less than 82 years old. Performed at [facility], [address] [place], [place] [address]        Labs: Basic Metabolic Panel: Last Labs        Recent Labs  Lab 12/09/23 1313 12/10/23 0526 12/11/23 0432 12/13/23 0802  NA 138 136 139 137  K 4.1 3.9 4.7 4.0  CL 101 100 105 104  CO2 27 26 23 26   GLUCOSE 111* 105* 100* 105*  BUN 24* 21 25* 20  CREATININE 1.16 1.13 1.35* 1.13  CALCIUM  8.5* 8.8* 8.7* 8.7*  MG 2.1 2.1  --  2.2  PHOS  --   --   --  3.3      Liver Function Tests: Last Labs     Recent Labs  Lab 12/09/23 1313  AST 38  ALT 30  ALKPHOS 64  BILITOT 1.0  PROT 6.3*  ALBUMIN  3.3*      Last Labs  No results for input(s): LIPASE, AMYLASE in the last 168 hours.   Last Labs  No results for input(s): AMMONIA in the last 168 hours.   CBC: Last Labs        Recent Labs  Lab 12/09/23 1313 12/10/23 0526 12/11/23 0432 12/13/23 0802  WBC 6.7 5.8 6.2 6.8  NEUTROABS 5.0   --  4.5  --   HGB 10.2* 10.3* 10.0* 11.1*  HCT 30.8* 30.6* 30.5* 33.4*  MCV 95.4 93.6 95.6 95.2  PLT 151 154 147* 153      Cardiac Enzymes: Last Labs  No results for input(s): CKTOTAL, CKMB, CKMBINDEX, TROPONINI in the last 168 hours.   BNP: BNP (last 3 results) Recent Labs (within last 365 days)       Recent Labs    04/05/23 1743 04/10/23 0418 04/15/23 0350  BNP 169.6* 437.6* 498.8*        ProBNP (last 3 results) Recent Labs (within last 365 days)  No results for input(s): PROBNP in the last 8760 hours.     CBG: Last Labs     Recent Labs  Lab 12/10/23 1125  GLUCAP 98              Signed:   Devaughn KATHEE Ban MD.  [facility] 12/13/2023, 3:14 PM

## 2023-12-13 NOTE — Plan of Care (Signed)

## 2023-12-13 NOTE — Progress Notes (Signed)
 1545 Discharged home with sister.

## 2023-12-13 NOTE — Progress Notes (Deleted)
 Cory Hess FMW:[id number] DOB: 02/18/1946 DOA: 12/09/2023  PCP: Towana Small, FNP  Admit date: 12/09/2023 Discharge date: 12/13/2023  Time spent: 35 minutes  Recommendations for Outpatient Follow-up:  Pcp f/u Cardiology f/u  Gen surg f/u Vascular surgery f/u Ensure compliance with levothyroxine  and repeat TFTs in 4-6 weeks    Discharge Diagnoses:  Principal Problem:   Acute blood loss anemia Active Problems:   Hx of CABG   Coronary artery disease   Near syncope   Essential hypertension   Acquired hypothyroidism   CHB (complete heart block) (HCC)   Cardiogenic shock (HCC)   Discharge Condition: stable  Diet recommendation: heart healthy  Filed Weights   12/09/23 1212 12/09/23 2159 12/10/23 1130  Weight: 94.3 kg 94.7 kg 96.4 kg    History of present illness:  From admission h and p Cory Hess is a 78 year old male with hypothyroid, hypertension, paroxysmal atrial fibrillation on Eliquis , recent left inguinal hernia repair 10/13, who presents ED for chief concerns of near syncope while sitting at [facility].   At bedside, patient was able to tell me his first last name, age, location, current calendar year.   He reports he had breakfast at 6 AM on the same day of presentation.  At breakfast, he had grits with sugar and several many muffins.   At around 9:30 AM, he went to [employer] to meet with some old time friends.  They hung out at [facility].  He took 1 dose of oxycodone  that was prescribed to him due to the hernia repair surgery.  He did not eat with this medication.   At approximately 1130, while at [facility], he developed dizziness and he felt like his eyes rolling in the back of his head and extreme weakness.  He denies loss of consciousness and states he was aware the entire time.  He reports this is never happened before.  He reports this feels different from other episodes of vertigo that he has been diagnosed.   He denies known trauma to  his person.  He reports that his left groin has had increased swelling since surgery especially on the 2nd and 3rd day postop.  Hospital Course:   Patient presents after a near syncopal event. Found to have complete heart block requiring temporary pacemaker insertion. Now s/p that temporary pacemaker, ziopatch placed by cardiology, plan for outpatient follow-up with holding of patient's coreg  and all av nodal agents. Patient is pod8 from elective repair of left inguinal hernia. He has had swelling and bruising in that area and CT shows a hematoma. Evaluated by vascular surgery, as no signs active bleeding they advised no intervention. Reviewed findings with Dr. Marinda of gen surg who says save to resume patient's anticoagulation so will do so. Patient is also noted to have elevated TSH in setting of poor compliance with levothyroxine , advise compliance and repeat TFTs in 4-6 weeks.   Procedures: Temp venous pacemaker placement  Consultations: cardiology  Discharge Exam: Vitals:   12/13/23 0600 12/13/23 0700  BP: 134/72 (!) 116/58  Pulse: 64 65  Resp: 15 11  Temp:    SpO2: 99% 94%    General: NAD Cardiovascular: RR Respiratory: CTAB Ext: warm, no edema  Discharge Instructions   Discharge Instructions     Diet - low sodium heart healthy   Complete by: As directed    Discharge wound care:   Complete by: As directed    Follow prior wound care instructions   Increase activity slowly   Complete by:  As directed       Allergies as of 12/13/2023   No Known Allergies      Medication List     PAUSE taking these medications    losartan  25 MG tablet Wait to take this until your doctor or other care provider tells you to start again. Commonly known as: COZAAR  Take 1 tablet (25 mg total) by mouth daily.       STOP taking these medications    carvedilol  6.25 MG tablet Commonly known as: COREG        TAKE these medications    acetaminophen  500 MG tablet Commonly  known as: TYLENOL  Take 1,000 mg by mouth in the morning and at bedtime.   apixaban  5 MG Tabs tablet Commonly known as: ELIQUIS  Take 1 tablet (5 mg total) by mouth 2 (two) times daily.   aspirin  81 MG chewable tablet Place 1 tablet (81 mg total) into feeding tube daily.   atorvastatin  80 MG tablet Commonly known as: LIPITOR  Place 1 tablet (80 mg total) into feeding tube daily. What changed: how to take this   Blood Pressure Kit Kit 1 each by Does not apply route in the morning, at noon, and at bedtime.   famotidine  20 MG tablet Commonly known as: PEPCID  Place 1 tablet (20 mg total) into feeding tube daily. What changed: how to take this   levothyroxine  100 MCG tablet Commonly known as: SYNTHROID  Take 1 tablet (100 mcg total) by mouth daily before breakfast.   multivitamin with minerals Tabs tablet Take 1 tablet by mouth daily.   oxyCODONE  5 MG immediate release tablet Commonly known as: Oxy IR/ROXICODONE  Take 1 tablet (5 mg total) by mouth every 6 (six) hours as needed for severe pain (pain score 7-10).   oxyCODONE  5 MG immediate release tablet Commonly known as: Roxicodone  Take 1 tablet (5 mg total) by mouth every 8 (eight) hours as needed.               Discharge Care Instructions  (From admission, onward)           Start     Ordered   12/13/23 0000  Discharge wound care:       Comments: Follow prior wound care instructions   12/13/23 1509           No Known Allergies    The results of significant diagnostics from this hospitalization (including imaging, microbiology, ancillary and laboratory) are listed below for reference.    Significant Diagnostic Studies: ECHOCARDIOGRAM COMPLETE Result Date: 12/11/2023    ECHOCARDIOGRAM REPORT   Patient Name:   Cory Hess Date of Exam: 12/11/2023 Medical Rec #:  [id number]       Height:       72.0 in Accession #:    [id number]      Weight:       212.5 lb Date of Birth:  [birth year]       BSA:           2.186 m Patient Age:    78 years        BP:           115/56 mmHg Patient Gender: M               HR:           71 bpm. Exam Location:  [facility] Procedure: 2D Echo, Cardiac Doppler and Color Doppler (Both Spectral and Color  Flow Doppler were utilized during procedure). Indications:     Heart block, Complete I44.2  History:         Patient has prior history of Echocardiogram examinations. CAD.  Sonographer:     Bari Roar Referring Phys:  [id number] RYAN M DUNN Diagnosing Phys: Annabella Scarce MD IMPRESSIONS  1. Left ventricular ejection fraction, by estimation, is 55 to 60%. The left ventricle has normal function. The left ventricle has no regional wall motion abnormalities. There is moderate concentric left ventricular hypertrophy. Left ventricular diastolic parameters are consistent with Grade I diastolic dysfunction (impaired relaxation).  2. Right ventricular systolic function is normal. The right ventricular size is normal.  3. Left atrial size was mildly dilated.  4. The mitral valve is normal in structure. Trivial mitral valve regurgitation. No evidence of mitral stenosis.  5. The aortic valve is tricuspid. Aortic valve regurgitation is not visualized. No aortic stenosis is present.  6. The inferior vena cava is normal in size with greater than 50% respiratory variability, suggesting right atrial pressure of 3 mmHg. FINDINGS  Left Ventricle: Left ventricular ejection fraction, by estimation, is 55 to 60%. The left ventricle has normal function. The left ventricle has no regional wall motion abnormalities. The left ventricular internal cavity size was normal in size. There is  moderate concentric left ventricular hypertrophy. Left ventricular diastolic parameters are consistent with Grade I diastolic dysfunction (impaired relaxation). Indeterminate filling pressures. Right Ventricle: The right ventricular size is normal. No increase in right ventricular wall thickness. Right ventricular systolic  function is normal. Left Atrium: Left atrial size was mildly dilated. Right Atrium: Right atrial size was normal in size. Pericardium: There is no evidence of pericardial effusion. Mitral Valve: The mitral valve is normal in structure. Trivial mitral valve regurgitation. No evidence of mitral valve stenosis. MV peak gradient, 5.7 mmHg. The mean mitral valve gradient is 2.0 mmHg. Tricuspid Valve: The tricuspid valve is normal in structure. Tricuspid valve regurgitation is trivial. No evidence of tricuspid stenosis. Aortic Valve: The aortic valve is tricuspid. Aortic valve regurgitation is not visualized. No aortic stenosis is present. Aortic valve mean gradient measures 2.0 mmHg. Aortic valve peak gradient measures 4.4 mmHg. Aortic valve area, by VTI measures 2.09 cm. Pulmonic Valve: The pulmonic valve was normal in structure. Pulmonic valve regurgitation is not visualized. No evidence of pulmonic stenosis. Aorta: The aortic root is normal in size and structure. Venous: The inferior vena cava is normal in size with greater than 50% respiratory variability, suggesting right atrial pressure of 3 mmHg. IAS/Shunts: No atrial level shunt detected by color flow Doppler.  LEFT VENTRICLE PLAX 2D LVIDd:         4.60 cm   Diastology LVIDs:         3.20 cm   LV e' medial:    7.62 cm/s LV PW:         1.20 cm   LV E/e' medial:  10.8 LV IVS:        1.40 cm   LV e' lateral:   11.40 cm/s LVOT diam:     1.90 cm   LV E/e' lateral: 7.2 LV SV:         41 LV SV Index:   19 LVOT Area:     2.84 cm  RIGHT VENTRICLE RV Basal diam:  3.60 cm RV Mid diam:    3.20 cm RV S prime:     10.40 cm/s TAPSE (M-mode): 2.1 cm LEFT ATRIUM  Index        RIGHT ATRIUM           Index LA diam:        4.30 cm 1.97 cm/m   RA Area:     20.20 cm LA Vol (A2C):   66.3 ml 30.33 ml/m  RA Volume:   56.70 ml  25.93 ml/m LA Vol (A4C):   67.0 ml 30.65 ml/m LA Biplane Vol: 69.3 ml 31.70 ml/m  AORTIC VALVE                    PULMONIC VALVE AV Area (Vmax):     2.09 cm     PV Vmax:        1.48 m/s AV Area (Vmean):   1.82 cm     PV Peak grad:   8.8 mmHg AV Area (VTI):     2.09 cm     RVOT Peak grad: 2 mmHg AV Vmax:           105.00 cm/s AV Vmean:          72.700 cm/s AV VTI:            0.194 m AV Peak Grad:      4.4 mmHg AV Mean Grad:      2.0 mmHg LVOT Vmax:         77.50 cm/s LVOT Vmean:        46.600 cm/s LVOT VTI:          0.143 m LVOT/AV VTI ratio: 0.74  AORTA Ao Root diam: 3.10 cm Ao Asc diam:  3.60 cm MITRAL VALVE                TRICUSPID VALVE MV Area (PHT): 4.44 cm     TR Peak grad:   21.7 mmHg MV Area VTI:   1.61 cm     TR Vmax:        233.00 cm/s MV Peak grad:  5.7 mmHg MV Mean grad:  2.0 mmHg     SHUNTS MV Vmax:       1.19 m/s     Systemic VTI:  0.14 m MV Vmean:      63.7 cm/s    Systemic Diam: 1.90 cm MV Decel Time: 171 msec MV E velocity: 82.00 cm/s MV A velocity: 120.00 cm/s MV E/A ratio:  0.68 MV A Prime:    17.3 cm/s Annabella Scarce MD Electronically signed by Annabella Scarce MD Signature Date/Time: 12/11/2023/10:51:20 AM    Final    CT ANGIO NECK W OR WO CONTRAST Result Date: 12/10/2023 EXAM: CTA Neck 12/10/2023 10:39:42 AM TECHNIQUE: CT of the neck was performed without and with the administration of 75 mL of iohexol  (OMNIPAQUE ) 350 MG/ML injection. Multiplanar 2D and/or 3D reformatted images are provided for review. Automated exposure control, iterative reconstruction, and/or weight based adjustment of the mA/kV was utilized to reduce the radiation dose to as low as reasonably achievable. Stenosis of the internal carotid arteries measured using NASCET criteria. COMPARISON: None available CLINICAL HISTORY: Syncope/presyncope, cerebrovascular cause suspected. FINDINGS: AORTIC ARCH AND ARCH VESSELS: Mild atherosclerosis of the aortic arch. No dissection or arterial injury. No significant stenosis of the brachiocephalic or subclavian arteries. CERVICAL CAROTID ARTERIES: The right carotid artery is patent from the origin to the skull base. Mixed  atherosclerotic plaque at the carotid bifurcation and right carotid bulb results in approximately 55% stenosis of the right ICA origin. There is an additional focal prominent region of noncalcified atherosclerotic  plaque in the proximal right cervical ICA with a similar degree of narrowing. The left carotid artery is patent from the origin to the skull base. Atherosclerosis at the left carotid bifurcation without hemodynamically significant stenosis. No dissection or arterial injury. CERVICAL VERTEBRAL ARTERIES: Vertebral arteries are patent from the origins to the vertebrobasilar confluence. Atherosclerosis at the right vertebral artery origin results in moderate stenosis. There is additional mild stenosis at the left vertebral artery origin. Tortuosity of the left V1 segment. The intracranial vertebral arteries are patent. There is an atretic appearance of the post PICA segment of the left vertebral artery with significant distal tapering of the left V4 segment without evidence of occlusion. No dissection or arterial injury. LUNGS AND MEDIASTINUM: Right chest wall port-a-cath is partially visualized. Sequelae of median sternotomy. Lungs are unremarkable. SOFT TISSUES: Bilateral lens replacement. Mucosal thickening and mucous retention cyst in the left maxillary sinus. No acute abnormality. BONES: No acute abnormality. LIMITATIONS/ARTIFACTS AND INCIDENTAL INTRACRANIAL FINDINGS: Limited visualization of intracranial structures. There is a small remote infarct noted within the superior right cerebellum. IMPRESSION: 1. Mixed atherosclerotic plaque at the right carotid bifurcation and bulb resulting in approximately 55% stenosis of the right ICA origin. 2. Moderate stenosis at the right vertebral artery origin. 3. Additional atherosclerosis as above. 4. 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 Electronically signed by: Donnice Mania MD 12/10/2023 10:55 AM EDT RP Workstation: [id number]   CT Angio Abd/Pel  W and/or Wo Contrast Result Date: 12/09/2023 CLINICAL DATA:  History of left inguinal hernia repair with hematoma. Suspicion for active bleeding. EXAM: CTA ABDOMEN AND PELVIS WITHOUT AND WITH CONTRAST TECHNIQUE: Multidetector CT imaging of the abdomen and pelvis was performed using the standard protocol during bolus administration of intravenous contrast. Multiplanar reconstructed images and MIPs were obtained and reviewed to evaluate the vascular anatomy. RADIATION DOSE REDUCTION: This exam was performed according to the departmental dose-optimization program which includes automated exposure control, adjustment of the mA and/or kV according to patient size and/or use of iterative reconstruction technique. CONTRAST:  OMNIPAQUE  IOHEXOL  350 MG/ML SOLN COMPARISON:  Chest CT scan earlier, same date. Prior abdominal CT scan and 07/17/2023 FINDINGS: VASCULAR Aorta: Stable moderate to advanced age related atherosclerotic calcifications but no aneurysm. Sizable ulcerated plaque or focal dissection noted involving the posterior aspect of the lower abdominal aorta on the left side measuring approximately 2.2 cm. Celiac: Atherosclerotic calcifications but no significant stenosis or dissection. SMA: Atherosclerotic calcifications but no significant stenosis. Renals: 2 renal arteries bilaterally. Two right and 3 left renal arteries no significant stenosis. Scattered atherosclerotic calcifications. IMA: Patent Inflow: Moderate atherosclerotic calcifications but no stenosis or dissection. Proximal Outflow: No significant findings. Scattered calcifications. Veins: No significant findings. Review of the MIP images confirms the above findings. NON-VASCULAR Lower chest: No acute pulmonary findings. Pulmonary scarring changes. Hepatobiliary: No hepatic lesions or intrahepatic biliary dilatation. Gallbladder is unremarkable. No common bile duct dilatation. Pancreas: Unremarkable. No pancreatic ductal dilatation or surrounding  inflammatory changes. Spleen: Normal in size without focal abnormality. Adrenals/Urinary Tract: Adrenal glands and kidneys are unremarkable. There is contrast in the renal collecting systems from prior CT scan. Bilateral renal cysts, unchanged. The bladder is unremarkable. Stomach/Bowel: The stomach, duodenum, small bowel and colon are grossly normal without oral contrast. No inflammatory changes, mass lesions or obstructive findings. The appendix is normal. Lymphatic: No abdominal or pelvic lymphadenopathy. Reproductive: Enlarged prostate gland. The seminal vesicles are unremarkable. Other: There is a sizable left groin hematoma measuring approximately 7.8 x 2.7 cm.  This is anterior to the inguinal canal. No extravasation of contrast material to suggest active bleeding. Small tortuous vessels in the spermatic cords bilaterally. Musculoskeletal: No significant bony findings. IMPRESSION: 1. Sizable left groin hematoma but no extravasation of contrast material to suggest active bleeding. 2. Sizable ulcerated plaque or focal dissection involving the posterior aspect of the lower abdominal aorta on the left side measuring approximately 2.2 cm. 3. No acute abdominal/pelvic findings, mass lesions or adenopathy. 4. Enlarged prostate gland. 5. Aortic atherosclerosis. Aortic Atherosclerosis (ICD10-I70.0). Electronically Signed   By: MYRTIS Stammer M.D.   On: 12/09/2023 18:02   CT Angio Chest PE W and/or Wo Contrast Result Date: 12/09/2023 CLINICAL DATA:  Pulmonary embolism (PE) suspected, high prob. Near syncopal episode. History of tongue carcinoma. * Tracking Code: BO * EXAM: CT ANGIOGRAPHY CHEST WITH CONTRAST TECHNIQUE: Multidetector CT imaging of the chest was performed using the standard protocol during bolus administration of intravenous contrast. Multiplanar CT image reconstructions and MIPs were obtained to evaluate the vascular anatomy. RADIATION DOSE REDUCTION: This exam was performed according to the  departmental dose-optimization program which includes automated exposure control, adjustment of the mA and/or kV according to patient size and/or use of iterative reconstruction technique. CONTRAST:  75mL OMNIPAQUE  IOHEXOL  350 MG/ML SOLN COMPARISON:  CT angiography chest from 07/17/2023. FINDINGS: Cardiovascular: No evidence of embolism to the proximal subsegmental pulmonary artery level. Mild cardiomegaly. No pericardial effusion. No aortic aneurysm. There are coronary artery calcifications, in keeping with coronary artery disease. There are also mild peripheral atherosclerotic vascular calcifications of thoracic aorta and its major branches. Mediastinum/Nodes: Visualized thyroid  gland appears grossly unremarkable. No solid / cystic mediastinal masses. The esophagus is nondistended precluding optimal assessment. No axillary, mediastinal or hilar lymphadenopathy by size criteria. Lungs/Pleura: The central tracheo-bronchial tree is patent. There is mild, smooth, circumferential thickening of the segmental and subsegmental bronchial walls, throughout bilateral lungs, which is nonspecific. Findings are most commonly seen with bronchitis or reactive airway disease, such as asthma. There are dependent changes in bilateral lungs. No mass or consolidation. No pleural effusion or pneumothorax. There is a new 5 x 6 mm ground-glass nodule in the right upper lobe (series 5, image 62). Upper Abdomen: Visualized upper abdominal viscera within normal limits. Musculoskeletal: The visualized soft tissues of the chest wall are grossly unremarkable. No suspicious osseous lesions. There are mild to moderate multilevel degenerative changes in the visualized spine. Review of the MIP images confirms the above findings. IMPRESSION: 1. No embolism to the proximal subsegmental pulmonary artery level. No lung mass, consolidation, pleural effusion or pneumothorax. 2. There is a new 5 x 6 mm ground-glass nodule in the right upper lobe.  Consider short-term follow-up examination in 3 months to document resolution versus stability. 3. Multiple other nonacute observations, as described above. Aortic Atherosclerosis (ICD10-I70.0). Electronically Signed   By: Ree Molt M.D.   On: 12/09/2023 15:51    Microbiology: Recent Results (from the past 240 hours)  MRSA Next Gen by PCR, Nasal     Status: None   Collection Time: 12/10/23 11:33 AM   Specimen: Nasal Mucosa; Nasal Swab  Result Value Ref Range Status   MRSA by PCR Next Gen NOT DETECTED NOT DETECTED Final    Comment: (NOTE) The GeneXpert MRSA Assay (FDA approved for NASAL specimens only), is one component of a comprehensive MRSA colonization surveillance program. It is not intended to diagnose MRSA infection nor to guide or monitor treatment for MRSA infections. Test performance is not FDA approved in patients  less than 78 years old. Performed at [facility], [address] [place], [place] [address]      Labs: Basic Metabolic Panel: Recent Labs  Lab 12/09/23 1313 12/10/23 0526 12/11/23 0432 12/13/23 0802  NA 138 136 139 137  K 4.1 3.9 4.7 4.0  CL 101 100 105 104  CO2 27 26 23 26   GLUCOSE 111* 105* 100* 105*  BUN 24* 21 25* 20  CREATININE 1.16 1.13 1.35* 1.13  CALCIUM  8.5* 8.8* 8.7* 8.7*  MG 2.1 2.1  --  2.2  PHOS  --   --   --  3.3   Liver Function Tests: Recent Labs  Lab 12/09/23 1313  AST 38  ALT 30  ALKPHOS 64  BILITOT 1.0  PROT 6.3*  ALBUMIN  3.3*   No results for input(s): LIPASE, AMYLASE in the last 168 hours. No results for input(s): AMMONIA in the last 168 hours. CBC: Recent Labs  Lab 12/09/23 1313 12/10/23 0526 12/11/23 0432 12/13/23 0802  WBC 6.7 5.8 6.2 6.8  NEUTROABS 5.0  --  4.5  --   HGB 10.2* 10.3* 10.0* 11.1*  HCT 30.8* 30.6* 30.5* 33.4*  MCV 95.4 93.6 95.6 95.2  PLT 151 154 147* 153   Cardiac Enzymes: No results for input(s): CKTOTAL, CKMB, CKMBINDEX, TROPONINI in the last 168  hours. BNP: BNP (last 3 results) Recent Labs    04/05/23 1743 04/10/23 0418 04/15/23 0350  BNP 169.6* 437.6* 498.8*    ProBNP (last 3 results) No results for input(s): PROBNP in the last 8760 hours.  CBG: Recent Labs  Lab 12/10/23 1125  GLUCAP 98       Signed:  Devaughn KATHEE Ban MD.  [facility] 12/13/2023, 3:14 PM

## 2023-12-14 ENCOUNTER — Ambulatory Visit: Admitting: Family Medicine

## 2023-12-14 ENCOUNTER — Telehealth: Payer: Self-pay | Admitting: *Deleted

## 2023-12-14 DIAGNOSIS — R55 Syncope and collapse: Secondary | ICD-10-CM | POA: Diagnosis not present

## 2023-12-14 DIAGNOSIS — I442 Atrioventricular block, complete: Secondary | ICD-10-CM | POA: Diagnosis not present

## 2023-12-14 NOTE — Transitions of Care (Post Inpatient/ED Visit) (Signed)
   12/14/2023  Name: Cory Hess MRN: [id number] DOB: 07-24-1945  Today's TOC FU Call Status: Today's TOC FU Call Status:: Unsuccessful Call (1st Attempt) Unsuccessful Call (1st Attempt) Date: 12/14/23  Attempted to reach the patient regarding the most recent Inpatient/ED visit.  Follow Up Plan: Additional outreach attempts will be made to reach the patient to complete the Transitions of Care (Post Inpatient/ED visit) call.   Cathlean Headland BSN RN [place] [facility] Health Care Management Coordinator [email or web address] Direct Dial: [phone]  Fax: [phone] Website: [email or web address]

## 2023-12-14 NOTE — Progress Notes (Deleted)
   Established Patient Office Visit  Subjective  Patient ID: Cory Hess, male    DOB: 08-17-1945  Age: 78 y.o. MRN: [id number]  No chief complaint on file.  Admitted 10/17-10/21 for near syncopal episode while at [employer]. Found L groin swelling with firmness and extensive ecchymosis extending to penis circumferentially with tenderness present. CT showed hematoma- causing acute blood loss anemia  L inguinal hernia repair on 10/13   Thyroid  function tests in 4 weeks    ROS    Objective:     There were no vitals taken for this visit. BP Readings from Last 3 Encounters:  12/13/23 (!) 116/58  12/05/23 (!) 157/82  10/26/23 (!) 148/82     Physical Exam    Assessment & Plan:  There are no diagnoses linked to this encounter.   No follow-ups on file.    Evalene Arts, FNP

## 2023-12-15 ENCOUNTER — Telehealth: Payer: Self-pay

## 2023-12-15 ENCOUNTER — Inpatient Hospital Stay: Admitting: Family Medicine

## 2023-12-15 NOTE — Transitions of Care (Post Inpatient/ED Visit) (Signed)
   12/15/2023  Name: Cory Hess MRN: [id number] DOB: 03-28-1945  Today's TOC FU Call Status: Today's TOC FU Call Status:: Unsuccessful Call (2nd Attempt) Unsuccessful Call (2nd Attempt) Date: 12/15/23  Attempted to reach the patient regarding the most recent Inpatient/ED visit.  Follow Up Plan: Additional outreach attempts will be made to reach the patient to complete the Transitions of Care (Post Inpatient/ED visit) call.   Alan Ee, RN, BSN, CEN [employer]- Transition of Care Team.  [facility] [phone]

## 2023-12-16 ENCOUNTER — Telehealth: Payer: Self-pay

## 2023-12-16 NOTE — Transitions of Care (Post Inpatient/ED Visit) (Signed)
   12/16/2023  Name: Cory Hess MRN: [id number] DOB: [birth year]  Today's TOC FU Call Status: Today's TOC FU Call Status:: Unsuccessful Call (3rd Attempt) Unsuccessful Call (3rd Attempt) Date: 12/16/23  Attempted to reach the patient regarding the most recent Inpatient/ED visit.  Follow Up Plan: No further outreach attempts will be made at this time. We have been unable to contact the patient.  Alan Ee, RN, BSN, CEN [employer]- Transition of Care Team.  [facility] [phone]

## 2023-12-20 ENCOUNTER — Telehealth: Payer: Self-pay | Admitting: General Surgery

## 2023-12-20 ENCOUNTER — Telehealth: Payer: Self-pay | Admitting: Internal Medicine

## 2023-12-20 NOTE — Telephone Encounter (Signed)
 Wound is bleeding on the outside now but went to the ER 12-09-23  and they did a cat scan with blood in the inside but looks like blood is old. I tried to give him an appt sooner that the one he has for 12-29-23 but said he gusee he would just wait till the 6th, but wants to know if he should do anything or should be concerned. Pt # is [phone]

## 2023-12-20 NOTE — Telephone Encounter (Signed)
?*  STAT* If patient is at the pharmacy, call can be transferred to refill team. ? ? ?1. Which medications need to be refilled? (please list name of each medication and dose if known) atorvastatin (LIPITOR) 80 MG tablet ? ?2. Which pharmacy/location (including street and city if local pharmacy) is medication to be sent to? [place], [place] ? ?3. Do they need a 30 day or 90 day supply? 90 ? ?

## 2023-12-21 NOTE — Telephone Encounter (Signed)
 Call placed to pt.  He has been made aware that we have already sent a year supply of Atorvastatin  to [facility] in July 2025.  He was appreciative of the call back and will call [facility].

## 2023-12-21 NOTE — Telephone Encounter (Signed)
 Called patient back and left a message letting him know that some drainage of the hematoma was to be expected. He needs to keep the area clean and dry and keep a dressing over it to absorb the drainage. He is to call us  back if he develops any increase pain, spreading redness, or thick bad smelling drainage. He may call back with any further questions.

## 2023-12-27 ENCOUNTER — Encounter (INDEPENDENT_AMBULATORY_CARE_PROVIDER_SITE_OTHER): Payer: Self-pay | Admitting: Vascular Surgery

## 2023-12-27 ENCOUNTER — Ambulatory Visit (INDEPENDENT_AMBULATORY_CARE_PROVIDER_SITE_OTHER): Admitting: Vascular Surgery

## 2023-12-27 VITALS — BP 157/83 | HR 69 | Resp 18 | Ht 72.0 in | Wt 212.4 lb

## 2023-12-27 DIAGNOSIS — I6523 Occlusion and stenosis of bilateral carotid arteries: Secondary | ICD-10-CM

## 2023-12-27 DIAGNOSIS — C01 Malignant neoplasm of base of tongue: Secondary | ICD-10-CM | POA: Diagnosis not present

## 2023-12-27 DIAGNOSIS — I10 Essential (primary) hypertension: Secondary | ICD-10-CM

## 2023-12-27 DIAGNOSIS — I2510 Atherosclerotic heart disease of native coronary artery without angina pectoris: Secondary | ICD-10-CM | POA: Diagnosis not present

## 2023-12-27 DIAGNOSIS — I2699 Other pulmonary embolism without acute cor pulmonale: Secondary | ICD-10-CM | POA: Diagnosis not present

## 2023-12-27 NOTE — Progress Notes (Signed)
 MRN : [id number]  Cory Hess is a 78 y.o. (Jun 04, 1945) male who presents with chief complaint of  Chief Complaint  Patient presents with   Follow-up    2 month follow up  .  History of Present Illness: Patient returns today in follow up of his carotid disease.  He underwent an angiogram about 6 weeks ago and was found to have subcritical right carotid stenosis.  No intervention was performed as this was estimated roughly 40% range.  He had an episode about 2 weeks ago and went to the emergency room.  At that visit, he had multiple CT scans.  He has previous history of pulmonary embolus and a CT angiogram of the chest as well as the abdomen and pelvis were performed.  No PE was seen.  Atherosclerotic plaque and possibly a small dissection was seen in the abdominal aorta.  Some hematoma in the left groin without extravasation or active bleeding. He also underwent a CT angiogram of the neck.  This was estimated at a 55% right carotid artery stenosis which I think is a little higher than I would have reported this.  This is also a little higher than his catheter-based angiogram suggested but still below the threshold for consideration for repair.  Left carotid stenosis was quite mild.  Current Outpatient Medications  Medication Sig Dispense Refill   acetaminophen  (TYLENOL ) 500 MG tablet Take 1,000 mg by mouth in the morning and at bedtime.     apixaban  (ELIQUIS ) 5 MG TABS tablet Take 1 tablet (5 mg total) by mouth 2 (two) times daily.     aspirin  81 MG chewable tablet Place 1 tablet (81 mg total) into feeding tube daily.     atorvastatin  (LIPITOR ) 80 MG tablet Place 1 tablet (80 mg total) into feeding tube daily. (Patient taking differently: Take 80 mg by mouth daily.) 90 tablet 3   Blood Pressure Monitoring (BLOOD PRESSURE KIT) KIT 1 each by Does not apply route in the morning, at noon, and at bedtime. 1 kit 0   famotidine  (PEPCID ) 20 MG tablet Place 1 tablet (20 mg total) into feeding tube  daily. (Patient taking differently: Take 20 mg by mouth daily.) 90 tablet 1   levothyroxine  (SYNTHROID ) 100 MCG tablet Take 1 tablet (100 mcg total) by mouth daily before breakfast. 60 tablet 1   [Paused] losartan  (COZAAR ) 25 MG tablet Take 1 tablet (25 mg total) by mouth daily. 90 tablet 3   Multiple Vitamin (MULTIVITAMIN WITH MINERALS) TABS tablet Take 1 tablet by mouth daily.     oxyCODONE  (OXY IR/ROXICODONE ) 5 MG immediate release tablet Take 1 tablet (5 mg total) by mouth every 6 (six) hours as needed for severe pain (pain score 7-10). 15 tablet 0   oxyCODONE  (ROXICODONE ) 5 MG immediate release tablet Take 1 tablet (5 mg total) by mouth every 8 (eight) hours as needed. 20 tablet 0   No current facility-administered medications for this visit.   Facility-Administered Medications Ordered in Other Visits  Medication Dose Route Frequency Provider Last Rate Last Admin   Heparin  (Porcine) in NaCl 1000-0.9 UT/500ML-% SOLN    PRN Emalee Knies S, MD   1,000 mL at 10/26/23 0834   Heparin  (Porcine) in NaCl 2000-0.9 UNIT/L-% SOLN    PRN Milton Sagona S, MD   1,000 mL at 10/26/23 0836   iodixanol  (VISIPAQUE ) 320 MG/ML injection    PRN Aibhlinn Kalmar S, MD   65 mL at 10/26/23 0835   lidocaine -EPINEPHrine  (PF) (XYLOCAINE -EPINEPHrine )  1 %-1:200000 (PF) injection    PRN Marea Selinda RAMAN, MD   10 mL at 10/26/23 9164    Past Medical History:  Diagnosis Date   Abscess of right arm 03/2023   pt states had absess to right arm after iv was inserted. Pt states he had a wound vac for 2 weeks   Alternating RBBB & LBBB    Ankle swelling 2024   Anticoagulated on apixaban     Aortic atherosclerosis    Arthritis of neck    Bilateral inguinal hernia    Bladder tumor    BPH (benign prostatic hyperplasia)    CAD (coronary artery disease)    a. 08/2017 STEMI/Cath: LM 80ost/d, LAD 100p, LCX 95 ost/p, RCA 60p, 33m, RPDA 80ost; b. 08/2017 CABG x 3: LIMA->LAD, VG->OM, VG->PDA; c. 03/2023 Cardiac Arrest/Cath: Occluded VG->RPDA, patent  LIMA->LAD and VG->pLCX. No new native dzs->Med rx. EF 25-35%.   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   CVA (cerebral vascular accident) (HCC) 04/08/2023   a.) brain MRI 04/08/2023: 2 small acute infarctions affecting the cortical brain in the RIGHT frontal region.   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 08/2017 TEE: EF 30-35%, sept/ant/inf HK, apical AK. 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 12/2017 Echo: EF 45-50%, diff HK; c. 09/2022 Echo: EF 55-60%; d. 03/2023 Echo (in settng of PE): EF 40-45%, GrI DD (imaging poor); e. 03/2023 Echo w/ definity : EF 55-60%, nl RV fxn.   Hyperlipidemia LDL goal <70    Hypothyroidism    Ischemic cardiomyopathy    a. 08/2017 TEE: EF 30-35%; b. 12/2017 Echo: EF 45-50%; c. 09/2022 Echo: EF 55-60%.   Long-term use of aspirin  therapy    Nephrolithiasis    PEA (Pulseless electrical activity) (HCC) 04/05/2023   a.) witnessed arrest at [facility] and CPR initiated immediately --> ROSC achieved --> EMS transferred to [facility] with multiple episodes of PEA arrest en route (further CPR required) --> ROSC achieved in ED with further muliple epsides of recurrent PEA arrest lasting secs-mins --> TCP applied and patient taken to cath lab for [facility] and TVP placement   Port-A-Cath in place    Pulmonary embolism (HCC) 04/15/2023   a.) CTA chest 04/15/2023: extensive clot burden with an occlusive clot in the RIGHT upper  lobe anterior and posterior segmental arteries, and a non occlusive thrombus in the RIGHT lower lobe main artery and superior and posterior basal segmental arteries --> required thrombectomy   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) LIMA-LAD, SVG-OM, SVG-PDA   Shingles 2018   Status post bilateral cataract extraction    STEMI (ST elevation myocardial infarction) (HCC) 08/27/2017   a.) LHC 08/27/2017: 80% o-dLM, 100% lPAD, 95% o-pLCx, 60% pRCA, 90% mRCA, 80% oRPDA-RPDA --> unable to cross lesions with wire --> IABP placed --> CVTS consulted and patient  transferred to [facility]; b.) s/p 3v CABG 08/27/2017: LIMA-LAD, SVG-OM, SVG-RPDA   Thrombocytopenia    Vertigo     Past Surgical History:  Procedure Laterality Date   CARDIAC CATHETERIZATION     CAROTID PTA/STENT INTERVENTION Right 10/26/2023   Procedure: CAROTID PTA/STENT INTERVENTION;  Surgeon: Marea Selinda RAMAN, MD;  Location: [facility] INV[facility];  Service: Cardiovascular;  Laterality: Right;   CATARACT EXTRACTION W/PHACO Left 07/18/2018   Procedure: CATARACT EXTRACTION PHACO AND INTRAOCULAR LENS PLACEMENT (IOC)  LEFT;  Surgeon: Myrna Adine Anes, MD;  Location: [facility];  Service: Ophthalmology;  Laterality: Left;   CATARACT EXTRACTION W/PHACO Right 08/14/2018   Procedure: CATARACT EXTRACTION PHACO AND INTRAOCULAR LENS PLACEMENT (IOC)  RIGHT;  Surgeon: Myrna Adine Anes, MD;  Location: [facility];  Service: Ophthalmology;  Laterality: Right;   CORONARY ARTERY BYPASS GRAFT N/A 08/27/2017   Procedure: CORONARY ARTERY BYPASS GRAFTING (CABG) ON PUMP USING LEFT INTERNAL MAMMARY ARTERY AND LEFT GREATER SAPHENOUS VEIN VIA ENDOVEIN HARVEST;  Surgeon: Lucas Dorise POUR, MD;  Location: [facility] OR;  Service: Open Heart Surgery;  Laterality: N/A;   CORONARY/GRAFT ACUTE MI REVASCULARIZATION N/A 08/27/2017   Procedure: Coronary/Graft Acute MI Revascularization;  Surgeon: Darron Deatrice LABOR, MD;  Location: [facility];  Service: Cardiovascular;   Laterality: N/A;   CYSTOSCOPY W/ RETROGRADES Bilateral 12/25/2018   Procedure: CYSTOSCOPY WITH RETROGRADE PYELOGRAM;  Surgeon: Penne Knee, MD;  Location: [facility] ORS;  Service: Urology;  Laterality: Bilateral;   FRACTURE SURGERY Right 1958   arm and left wrist compound fracture, no metal   HERNIA REPAIR Right    inguinial   INGUINAL HERNIA REPAIR Left 12/05/2023   Procedure: REPAIR, HERNIA, INGUINAL, ADULT;  Surgeon: Marinda Jayson KIDD, MD;  Location: [facility] OR[facility];  Service: General;  Laterality: Left;  open procedure with mesh   INSERTION OF MESH  12/05/2023   Procedure: INSERTION OF MESH;  Surgeon: Marinda Jayson KIDD, MD;  Location: [facility] OR[facility];  Service: General;;   IR IMAGING GUIDED PORT INSERTION  10/15/2022   LEFT HEART CATH AND CORONARY ANGIOGRAPHY N/A 08/27/2017   Procedure: LEFT HEART CATH AND CORONARY ANGIOGRAPHY;  Surgeon: Darron Deatrice LABOR, MD;  Location: [facility];  Service: Cardiovascular;  Laterality: N/A;   PULMONARY THROMBECTOMY Bilateral 04/18/2023   Procedure: PULMONARY THROMBECTOMY;  Surgeon: Marea Selinda RAMAN, MD;  Location: [facility];  Service: Cardiovascular;  Laterality: Bilateral;   RIGHT/LEFT HEART CATH AND CORONARY/GRAFT ANGIOGRAPHY N/A 04/05/2023   Procedure: RIGHT/LEFT HEART CATH AND CORONARY/GRAFT ANGIOGRAPHY;  Surgeon: Mady Bruckner, MD;  Location: [facility];  Service: Cardiovascular;  Laterality: N/A;   TEMPORARY PACEMAKER N/A 04/05/2023   Procedure: TEMPORARY PACEMAKER;  Surgeon: Mady Bruckner, MD;  Location: [facility];  Service: Cardiovascular;  Laterality: N/A;   TEMPORARY PACEMAKER N/A 12/10/2023   Procedure: TEMPORARY PACEMAKER;  Surgeon: Florencio Cara BIRCH, MD;  Location: [facility];  Service: Cardiovascular;  Laterality: N/A;   TRANSURETHRAL RESECTION OF BLADDER TUMOR WITH MITOMYCIN -C N/A 12/25/2018   Procedure: TRANSURETHRAL RESECTION OF BLADDER TUMOR WITH Gemcitabine ;  Surgeon: Penne Knee, MD;   Location: [facility] OR[facility];  Service: Urology;  Laterality: N/A;     Social History   Tobacco Use   Smoking status: Former    Current packs/day: 0.00    Types: Cigarettes    Quit date: 2000    Years since quitting: 25.8    Passive exposure: Past   Smokeless tobacco: Former    Types: Snuff  Vaping Use   Vaping status: Never Used  Substance Use Topics   Alcohol use: Not Currently   Drug use: Not Currently      Family History  Problem Relation Age of Onset   Heart failure Mother    Lung disease Father    Stroke Father    Cervical cancer Maternal Grandmother      No Known Allergies   REVIEW OF SYSTEMS (Negative unless checked)   Constitutional: [] Weight loss  [] Fever  [] Chills Cardiac: [] Chest pain   [] Chest pressure   [] Palpitations   [] Shortness of breath when laying flat   [] Shortness of breath at rest   [x] Shortness of breath with exertion. Vascular:  [] Pain in legs with walking   [] Pain in legs at rest   [] Pain in legs when laying flat   [] Claudication   [] Pain in feet when walking  [] Pain in feet at rest  [] Pain in feet when laying flat   [x] History of DVT   [x] Phlebitis   [x] Swelling in legs   [] Varicose veins   [] Non-healing ulcers Pulmonary:   [] Uses home oxygen   [] Productive cough   [] Hemoptysis   [] Wheeze  [] COPD   [] Asthma Neurologic:  [] Dizziness  [] Blackouts   [] Seizures   [] History of stroke   [] History of TIA  [] Aphasia   [] Temporary blindness   [] Dysphagia   [] Weakness or numbness in arms   [] Weakness or numbness in legs Musculoskeletal:  [x] Arthritis   [] Joint swelling   [x] Joint pain   [] Low back pain Hematologic:  [] Easy bruising  [] Easy bleeding   [] Hypercoagulable state   [] Anemic  [] Hepatitis Gastrointestinal:  [] Blood in stool   [] Vomiting blood  [] Gastroesophageal reflux/heartburn   [] Abdominal pain Genitourinary:  [] Chronic kidney disease   [] Difficult urination  [x] Frequent urination  [] Burning with urination   [] Hematuria Skin:  [] Rashes   [] Ulcers    [] Wounds Psychological:  [] History of anxiety   []  History of major depression.  Physical Examination  BP (!) 157/83   Pulse 69   Resp 18   Ht 6' (1.829 m)   Wt 212 lb 6.4 oz (96.3 kg)   BMI 28.81 kg/m  Gen:  WD/WN, NAD.  Appears younger than stated age Head: [place]/AT, No temporalis wasting. Ear/Nose/Throat: Hearing grossly intact, nares w/o erythema or drainage Eyes: Conjunctiva clear. Sclera non-icteric Neck: Supple.  Trachea midline Pulmonary:  Good air movement, no use of accessory muscles.  Cardiac: RRR, no JVD Vascular:  Vessel Right Left  Radial Palpable Palpable               Musculoskeletal: M/S 5/5 throughout.  No deformity or atrophy.  No edema. Neurologic: Sensation grossly intact in extremities.  Symmetrical.  Speech is fluent.  Psychiatric: Judgment intact, Mood & affect appropriate for pt's clinical situation. Dermatologic: No rashes or ulcers noted.  No cellulitis or open wounds.      Labs Recent Results (from the past 2160 hours)  BUN     Status: None   Collection Time: 10/26/23  7:43 AM  Result Value Ref Range   BUN 17 8 - 23 mg/dL    Comment: Performed at [facility], [address] [place], [place] [address]  Creatinine, serum     Status: None   Collection Time: 10/26/23  7:43 AM  Result Value Ref Range   Creatinine, Ser 1.21 0.61 - 1.24 mg/dL   GFR, Estimated >39 >39 mL/min    Comment: (NOTE) Calculated using the CKD-EPI Creatinine Equation (2021) Performed at  [facility], [address] [place], [place] [address]   CBC     Status: Abnormal   Collection Time: 11/29/23  2:11 PM  Result Value Ref Range   WBC 5.7 4.0 - 10.5 K/uL   RBC 3.66 (L) 4.22 - 5.81 MIL/uL   Hemoglobin 11.5 (L) 13.0 - 17.0 g/dL   HCT 64.7 (L) 60.9 - 47.9 %   MCV 96.2 80.0 - 100.0 fL   MCH 31.4 26.0 - 34.0 pg   MCHC 32.7 30.0 - 36.0 g/dL   RDW 87.3 88.4 - 84.4 %   Platelets 155 150 - 400 K/uL   nRBC 0.0 0.0 - 0.2 %    Comment: Performed at  [facility], [address], [place], [place] [address]  Basic metabolic panel     Status: Abnormal   Collection Time: 11/29/23  2:11 PM  Result Value Ref Range   Sodium 139 135 - 145 mmol/L   Potassium 4.0 3.5 - 5.1 mmol/L   Chloride 104 98 - 111 mmol/L   CO2 27 22 - 32 mmol/L   Glucose, Bld 87 70 - 99 mg/dL    Comment: Glucose reference range applies only to samples taken after fasting for at least 8 hours.   BUN 20 8 - 23 mg/dL   Creatinine, Ser 8.87 0.61 - 1.24 mg/dL   Calcium  8.7 (L) 8.9 - 10.3 mg/dL   GFR, Estimated >39 >39 mL/min    Comment: (NOTE) Calculated using the CKD-EPI Creatinine Equation (2021)    Anion gap 8 5 - 15    Comment: Performed at [facility], [address] [place], [place] [address]  CBC with Differential     Status: Abnormal   Collection Time: 12/09/23  1:13 PM  Result Value Ref Range   WBC 6.7 4.0 - 10.5 K/uL   RBC 3.23 (L) 4.22 - 5.81 MIL/uL   Hemoglobin 10.2 (L) 13.0 - 17.0 g/dL   HCT 69.1 (L) 60.9 - 47.9 %   MCV 95.4 80.0 - 100.0 fL   MCH 31.6 26.0 - 34.0 pg   MCHC 33.1 30.0 - 36.0 g/dL   RDW 87.3 88.4 - 84.4 %   Platelets 151 150 - 400 K/uL   nRBC 0.0 0.0 - 0.2 %   Neutrophils Relative % 74 %   Neutro Abs 5.0 1.7 - 7.7 K/uL   Lymphocytes Relative 11 %   Lymphs Abs 0.7 0.7 - 4.0 K/uL   Monocytes Relative 9 %   Monocytes Absolute 0.6 0.1 - 1.0 K/uL   Eosinophils Relative 5 %   Eosinophils Absolute 0.3 0.0 - 0.5 K/uL   Basophils Relative 1 %   Basophils Absolute 0.0 0.0 - 0.1 K/uL   Immature Granulocytes 0 %   Abs Immature Granulocytes 0.02 0.00 - 0.07 K/uL    Comment: Performed at [facility], [address] [place], [place] [address]  Comprehensive metabolic panel     Status: Abnormal   Collection Time: 12/09/23  1:13 PM  Result Value Ref Range   Sodium 138 135 - 145 mmol/L   Potassium 4.1 3.5 - 5.1 mmol/L   Chloride 101 98 - 111 mmol/L   CO2 27 22 - 32 mmol/L   Glucose, Bld 111 (H) 70 - 99 mg/dL     Comment: Glucose reference range applies only to samples taken after fasting for at least 8 hours.   BUN 24 (H) 8 - 23 mg/dL   Creatinine, Ser 8.83 0.61 -  1.24 mg/dL   Calcium  8.5 (L) 8.9 - 10.3 mg/dL   Total Protein 6.3 (L) 6.5 - 8.1 g/dL   Albumin  3.3 (L) 3.5 - 5.0 g/dL   AST 38 15 - 41 U/L   ALT 30 0 - 44 U/L   Alkaline Phosphatase 64 38 - 126 U/L   Total Bilirubin 1.0 0.0 - 1.2 mg/dL   GFR, Estimated >39 >39 mL/min    Comment: (NOTE) Calculated using the CKD-EPI Creatinine Equation (2021)    Anion gap 10 5 - 15    Comment: Performed at [facility], [address] [place], [place] [address]  Troponin I (High Sensitivity)     Status: None   Collection Time: 12/09/23  1:13 PM  Result Value Ref Range   Troponin I (High Sensitivity) 12 <18 ng/L    Comment: (NOTE) Elevated high sensitivity troponin I (hsTnI) values and significant  changes across serial measurements may suggest ACS but many other  chronic and acute conditions are known to elevate hsTnI results.  Refer to the Links section for chest pain algorithms and additional  guidance. Performed at [facility], [address] [place], [place] [address]   Magnesium      Status: None   Collection Time: 12/09/23  1:13 PM  Result Value Ref Range   Magnesium  2.1 1.7 - 2.4 mg/dL    Comment: Performed at [facility], [address] [place], [place] [address]  Troponin I (High Sensitivity)     Status: None   Collection Time: 12/09/23  3:46 PM  Result Value Ref Range   Troponin I (High Sensitivity) 15 <18 ng/L    Comment: (NOTE) Elevated high sensitivity troponin I (hsTnI) values and significant  changes across serial measurements may suggest ACS but many other  chronic and acute conditions are known to elevate hsTnI results.  Refer to the Links section for chest pain algorithms and additional  guidance. Performed at [facility], [address] [place], [place] [address]    Basic metabolic panel     Status: Abnormal   Collection Time: 12/10/23  5:26 AM  Result Value Ref Range   Sodium 136 135 - 145 mmol/L   Potassium 3.9 3.5 - 5.1 mmol/L   Chloride 100 98 - 111 mmol/L   CO2 26 22 - 32 mmol/L   Glucose, Bld 105 (H) 70 - 99 mg/dL    Comment: Glucose reference range applies only to samples taken after fasting for at least 8 hours.   BUN 21 8 - 23 mg/dL   Creatinine, Ser 8.86 0.61 - 1.24 mg/dL   Calcium  8.8 (L) 8.9 - 10.3 mg/dL   GFR, Estimated >39 >39 mL/min    Comment: (NOTE) Calculated using the CKD-EPI Creatinine Equation (2021)    Anion gap 10 5 - 15    Comment: Performed at [facility] [address] [place], [place] [address]  CBC     Status: Abnormal   Collection Time: 12/10/23  5:26 AM  Result Value Ref Range   WBC 5.8 4.0 - 10.5 K/uL   RBC 3.27 (L) 4.22 - 5.81 MIL/uL   Hemoglobin 10.3 (L) 13.0 - 17.0 g/dL   HCT 69.3 (L) 60.9 - 47.9 %   MCV 93.6 80.0 - 100.0 fL   MCH 31.5 26.0 - 34.0 pg   MCHC 33.7 30.0 - 36.0 g/dL   RDW 87.2 88.4 - 84.4 %   Platelets 154 150 - 400 K/uL   nRBC 0.0  0.0 - 0.2 %    Comment: Performed at [facility], [address] [place], [place] [address]  TSH     Status: Abnormal   Collection Time: 12/10/23  5:26 AM  Result Value Ref Range   TSH 42.899 (H) 0.350 - 4.500 uIU/mL    Comment: Performed by a 3rd Generation assay with a functional sensitivity of <=0.01 uIU/mL. Performed at [facility], [address] [place], [place] [address]   T4, free     Status: Abnormal   Collection Time: 12/10/23  5:26 AM  Result Value Ref Range   Free T4 0.60 (L) 0.61 - 1.12 ng/dL    Comment: (NOTE) Biotin ingestion may interfere with free T4 tests. If the results are inconsistent with the TSH level, previous test results, or the clinical presentation, then consider biotin interference. If needed, order repeat testing after stopping biotin. Performed at [facility], [address] [place], [place] [address]   Magnesium      Status: None   Collection Time: 12/10/23  5:26 AM  Result Value Ref Range   Magnesium  2.1 1.7 - 2.4 mg/dL    Comment: Performed at [facility], [address] [place], [place] [address]  Glucose, capillary     Status: None   Collection Time: 12/10/23 11:25 AM  Result Value Ref Range   Glucose-Capillary 98 70 - 99 mg/dL    Comment: Glucose reference range applies only to samples taken after fasting for at least 8 hours.  MRSA Next Gen by PCR, Nasal     Status: None   Collection Time: 12/10/23 11:33 AM   Specimen: Nasal Mucosa; Nasal Swab  Result Value Ref Range   MRSA by PCR Next Gen NOT DETECTED NOT DETECTED    Comment: (NOTE) The GeneXpert MRSA Assay (FDA approved for NASAL specimens only), is one component of a comprehensive MRSA colonization surveillance program. It is not intended to diagnose MRSA infection nor to guide or monitor treatment for MRSA infections. Test performance is not FDA approved in patients less than 61 years old. Performed at [facility], [address] [place], [place] [address]   Troponin I (High Sensitivity)     Status: Abnormal   Collection Time: 12/10/23  2:44 PM  Result Value Ref Range   Troponin I (High Sensitivity) 49 (H) <18 ng/L    Comment: CRITICAL RESULT CALLED TO, READ BACK BY AND VERIFIED WITH JESSICA TAYLOR @1535  12/10/23 MJU (NOTE) Elevated high sensitivity troponin I (hsTnI) values and significant  changes across serial measurements may suggest ACS but many other  chronic and acute conditions are known to elevate hsTnI results.  Refer to the Links section for chest pain algorithms and additional  guidance. Performed at [facility], [address] [place], [place] [address]   Lyme Disease Serology w/Reflex     Status: None   Collection Time: 12/10/23  2:44 PM  Result Value Ref Range   Lyme Total Antibody EIA Negative Negative    Comment: (NOTE) Lyme  antibodies not detected. Reflex testing is not indicated. No laboratory evidence of infection with B. burgdorferi (Lyme disease). Negative results may occur in patients recently infected (less than or equal to 14 days) with B. burgdorferi.  If recent infection is suspected, repeat testing on a new sample collected in 7 to 14 days is recommended. Performed At: [facility] [address] [place], [place] [id number] Jennette Shorter MD Ey:[id number]   CBC with Differential/Platelet  Status: Abnormal   Collection Time: 12/11/23  4:32 AM  Result Value Ref Range   WBC 6.2 4.0 - 10.5 K/uL   RBC 3.19 (L) 4.22 - 5.81 MIL/uL   Hemoglobin 10.0 (L) 13.0 - 17.0 g/dL   HCT 69.4 (L) 60.9 - 47.9 %   MCV 95.6 80.0 - 100.0 fL   MCH 31.3 26.0 - 34.0 pg   MCHC 32.8 30.0 - 36.0 g/dL   RDW 87.4 88.4 - 84.4 %   Platelets 147 (L) 150 - 400 K/uL   nRBC 0.0 0.0 - 0.2 %   Neutrophils Relative % 71 %   Neutro Abs 4.5 1.7 - 7.7 K/uL   Lymphocytes Relative 14 %   Lymphs Abs 0.9 0.7 - 4.0 K/uL   Monocytes Relative 11 %   Monocytes Absolute 0.7 0.1 - 1.0 K/uL   Eosinophils Relative 3 %   Eosinophils Absolute 0.2 0.0 - 0.5 K/uL   Basophils Relative 1 %   Basophils Absolute 0.0 0.0 - 0.1 K/uL   Immature Granulocytes 0 %   Abs Immature Granulocytes 0.02 0.00 - 0.07 K/uL    Comment: Performed at [facility], [address], [place], [place] [address]  Basic metabolic panel     Status: Abnormal   Collection Time: 12/11/23  4:32 AM  Result Value Ref Range   Sodium 139 135 - 145 mmol/L   Potassium 4.7 3.5 - 5.1 mmol/L   Chloride 105 98 - 111 mmol/L   CO2 23 22 - 32 mmol/L   Glucose, Bld 100 (H) 70 - 99 mg/dL    Comment: Glucose reference range applies only to samples taken after fasting for at least 8 hours.   BUN 25 (H) 8 - 23 mg/dL   Creatinine, Ser 8.64 (H) 0.61 - 1.24 mg/dL   Calcium  8.7 (L) 8.9 - 10.3 mg/dL   GFR, Estimated 54 (L) >60 mL/min    Comment: (NOTE) Calculated using  the CKD-EPI Creatinine Equation (2021)    Anion gap 11 5 - 15    Comment: Performed at [facility], [address] [place], [place] [address]  ECHOCARDIOGRAM COMPLETE     Status: None   Collection Time: 12/11/23 10:48 AM  Result Value Ref Range   Weight 3,400.38 oz   Height 72 in   BP 115/56 mmHg   Ao pk vel 1.05 m/s   AV Area VTI 2.09 cm2   AR max vel 2.09 cm2   AV Mean grad 2.0 mmHg   AV Peak grad 4.4 mmHg   S' Lateral 3.20 cm   AV Area mean vel 1.82 cm2   Area-P 1/2 4.44 cm2   MV VTI 1.61 cm2   Est EF 55 - 60%   Troponin I (High Sensitivity)     Status: Abnormal   Collection Time: 12/11/23 10:50 AM  Result Value Ref Range   Troponin I (High Sensitivity) 24 (H) <18 ng/L    Comment: (NOTE) Elevated high sensitivity troponin I (hsTnI) values and significant  changes across serial measurements may suggest ACS but many other  chronic and acute conditions are known to elevate hsTnI results.  Refer to the Links section for chest pain algorithms and additional  guidance. Performed at [facility], [address] [place], [place] [address]   Troponin I (High Sensitivity)     Status: Abnormal   Collection Time: 12/11/23 11:52 AM  Result Value Ref Range   Troponin I (High Sensitivity) 24 (H) <18 ng/L  Comment: (NOTE) Elevated high sensitivity troponin I (hsTnI) values and significant  changes across serial measurements may suggest ACS but many other  chronic and acute conditions are known to elevate hsTnI results.  Refer to the Links section for chest pain algorithms and additional  guidance. Performed at [facility], [address] [place], [place] [address]   Basic metabolic panel with GFR     Status: Abnormal   Collection Time: 12/13/23  8:02 AM  Result Value Ref Range   Sodium 137 135 - 145 mmol/L   Potassium 4.0 3.5 - 5.1 mmol/L   Chloride 104 98 - 111 mmol/L   CO2 26 22 - 32 mmol/L   Glucose, Bld 105 (H) 70 - 99 mg/dL     Comment: Glucose reference range applies only to samples taken after fasting for at least 8 hours.   BUN 20 8 - 23 mg/dL   Creatinine, Ser 8.86 0.61 - 1.24 mg/dL   Calcium  8.7 (L) 8.9 - 10.3 mg/dL   GFR, Estimated >39 >39 mL/min    Comment: (NOTE) Calculated using the CKD-EPI Creatinine Equation (2021)    Anion gap 7 5 - 15    Comment: Performed at [facility], [address] [place], [place] [address]  Magnesium      Status: None   Collection Time: 12/13/23  8:02 AM  Result Value Ref Range   Magnesium  2.2 1.7 - 2.4 mg/dL    Comment: Performed at [facility], [address] [place], [place] [address]  Phosphorus     Status: None   Collection Time: 12/13/23  8:02 AM  Result Value Ref Range   Phosphorus 3.3 2.5 - 4.6 mg/dL    Comment: Performed at [facility], [address] [place], [place] [address]  CBC     Status: Abnormal   Collection Time: 12/13/23  8:02 AM  Result Value Ref Range   WBC 6.8 4.0 - 10.5 K/uL   RBC 3.51 (L) 4.22 - 5.81 MIL/uL   Hemoglobin 11.1 (L) 13.0 - 17.0 g/dL   HCT 66.5 (L) 60.9 - 47.9 %   MCV 95.2 80.0 - 100.0 fL   MCH 31.6 26.0 - 34.0 pg   MCHC 33.2 30.0 - 36.0 g/dL   RDW 87.4 88.4 - 84.4 %   Platelets 153 150 - 400 K/uL   nRBC 0.0 0.0 - 0.2 %    Comment: Performed at [facility], [address] [place], [place] [address]    Radiology ECHOCARDIOGRAM COMPLETE Result Date: 12/11/2023    ECHOCARDIOGRAM REPORT   Patient Name:   Cory Hess Date of Exam: 12/11/2023 Medical Rec #:  [id number]       Height:       72.0 in Accession #:    [id number]      Weight:       212.5 lb Date of Birth:  Jan 08, 1946       BSA:          2.186 m Patient Age:    78 years        BP:           115/56 mmHg Patient Gender: M               HR:           71 bpm. Exam Location:  [facility] Procedure: 2D Echo, Cardiac Doppler and Color Doppler (Both Spectral and Color  Flow Doppler were utilized during procedure). 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 CAD.  Sonographer:     Bari Roar Referring Phys:  [id number] RYAN M DUNN Diagnosing Phys: Annabella Scarce MD IMPRESSIONS  1. Left ventricular ejection fraction, by estimation, is 55 to 60%. The left ventricle has normal function. The left ventricle has no regional wall motion abnormalities. There is moderate concentric left ventricular hypertrophy. Left ventricular diastolic parameters are consistent with Grade I diastolic dysfunction (impaired relaxation).  2. Right ventricular systolic function is normal. The right ventricular size is normal.  3. Left atrial size was mildly dilated.  4. The mitral valve is normal in structure. Trivial mitral valve regurgitation. No evidence of mitral stenosis.  5. The aortic valve is tricuspid. Aortic valve regurgitation is not visualized. No aortic stenosis is present.  6. The inferior vena cava is normal in size with greater than 50% respiratory variability, suggesting right atrial pressure of 3 mmHg. FINDINGS  Left Ventricle: Left ventricular ejection fraction, by estimation, is 55 to 60%. The left ventricle has normal function. The left ventricle has no regional wall motion abnormalities. The left ventricular internal cavity size was normal in size. There is  moderate concentric left ventricular hypertrophy. Left ventricular diastolic parameters are consistent with Grade I diastolic dysfunction (impaired relaxation). Indeterminate filling pressures. Right Ventricle: The right ventricular size is normal. No increase in right ventricular wall thickness. Right ventricular systolic function is normal. Left Atrium: Left atrial size was mildly dilated. Right Atrium: Right atrial size was normal in size. Pericardium: There is no evidence of pericardial effusion. Mitral Valve: The mitral valve is normal in structure. Trivial mitral valve regurgitation. No evidence of mitral valve  stenosis. MV peak gradient, 5.7 mmHg. The mean mitral valve gradient is 2.0 mmHg. Tricuspid Valve: The tricuspid valve is normal in structure. Tricuspid valve regurgitation is trivial. No evidence of tricuspid stenosis. Aortic Valve: The aortic valve is tricuspid. Aortic valve regurgitation is not visualized. No aortic stenosis is present. Aortic valve mean gradient measures 2.0 mmHg. Aortic valve peak gradient measures 4.4 mmHg. Aortic valve area, by VTI measures 2.09 cm. Pulmonic Valve: The pulmonic valve was normal in structure. Pulmonic valve regurgitation is not visualized. No evidence of pulmonic stenosis. Aorta: The aortic root is normal in size and structure. Venous: The inferior vena cava is normal in size with greater than 50% respiratory variability, suggesting right atrial pressure of 3 mmHg. IAS/Shunts: No atrial level shunt detected by color flow Doppler.  LEFT VENTRICLE PLAX 2D LVIDd:         4.60 cm   Diastology LVIDs:         3.20 cm   LV e' medial:    7.62 cm/s LV PW:         1.20 cm   LV E/e' medial:  10.8 LV IVS:        1.40 cm   LV e' lateral:   11.40 cm/s LVOT diam:     1.90 cm   LV E/e' lateral: 7.2 LV SV:         41 LV SV Index:   19 LVOT Area:     2.84 cm  RIGHT VENTRICLE RV Basal diam:  3.60 cm RV Mid diam:    3.20 cm RV S prime:     10.40 cm/s TAPSE (M-mode): 2.1 cm LEFT ATRIUM  Index        RIGHT ATRIUM           Index LA diam:        4.30 cm 1.97 cm/m   RA Area:     20.20 cm LA Vol (A2C):   66.3 ml 30.33 ml/m  RA Volume:   56.70 ml  25.93 ml/m LA Vol (A4C):   67.0 ml 30.65 ml/m LA Biplane Vol: 69.3 ml 31.70 ml/m  AORTIC VALVE                    PULMONIC VALVE AV Area (Vmax):    2.09 cm     PV Vmax:        1.48 m/s AV Area (Vmean):   1.82 cm     PV Peak grad:   8.8 mmHg AV Area (VTI):     2.09 cm     RVOT Peak grad: 2 mmHg AV Vmax:           105.00 cm/s AV Vmean:          72.700 cm/s AV VTI:            0.194 m AV Peak Grad:      4.4 mmHg AV Mean Grad:      2.0 mmHg  LVOT Vmax:         77.50 cm/s LVOT Vmean:        46.600 cm/s LVOT VTI:          0.143 m LVOT/AV VTI ratio: 0.74  AORTA Ao Root diam: 3.10 cm Ao Asc diam:  3.60 cm MITRAL VALVE                TRICUSPID VALVE MV Area (PHT): 4.44 cm     TR Peak grad:   21.7 mmHg MV Area VTI:   1.61 cm     TR Vmax:        233.00 cm/s MV Peak grad:  5.7 mmHg MV Mean grad:  2.0 mmHg     SHUNTS MV Vmax:       1.19 m/s     Systemic VTI:  0.14 m MV Vmean:      63.7 cm/s    Systemic Diam: 1.90 cm MV Decel Time: 171 msec MV E velocity: 82.00 cm/s MV A velocity: 120.00 cm/s MV E/A ratio:  0.68 MV A Prime:    17.3 cm/s Annabella Scarce MD Electronically signed by Annabella Scarce MD Signature Date/Time: 12/11/2023/10:51:20 AM    Final    CT ANGIO NECK W OR WO CONTRAST Result Date: 12/10/2023 EXAM: CTA Neck 12/10/2023 10:39:42 AM TECHNIQUE: CT of the neck was performed without and with the administration of 75 mL of iohexol  (OMNIPAQUE ) 350 MG/ML injection. Multiplanar 2D and/or 3D reformatted images are provided for review. Automated exposure control, iterative reconstruction, and/or weight based adjustment of the mA/kV was utilized to reduce the radiation dose to as low as reasonably achievable. Stenosis of the internal carotid arteries measured using NASCET criteria. COMPARISON: None available CLINICAL HISTORY: Syncope/presyncope, cerebrovascular cause suspected. FINDINGS: AORTIC ARCH AND ARCH VESSELS: Mild atherosclerosis of the aortic arch. No dissection or arterial injury. No significant stenosis of the brachiocephalic or subclavian arteries. CERVICAL CAROTID ARTERIES: The right carotid artery is patent from the origin to the skull base. Mixed atherosclerotic plaque at the carotid bifurcation and right carotid bulb results in approximately 55% stenosis of the right ICA origin. There is an additional focal prominent region of noncalcified atherosclerotic plaque  in the proximal right cervical ICA with a similar degree of narrowing. The  left carotid artery is patent from the origin to the skull base. Atherosclerosis at the left carotid bifurcation without hemodynamically significant stenosis. No dissection or arterial injury. CERVICAL VERTEBRAL ARTERIES: Vertebral arteries are patent from the origins to the vertebrobasilar confluence. Atherosclerosis at the right vertebral artery origin results in moderate stenosis. There is additional mild stenosis at the left vertebral artery origin. Tortuosity of the left V1 segment. The intracranial vertebral arteries are patent. There is an atretic appearance of the post PICA segment of the left vertebral artery with significant distal tapering of the left V4 segment without evidence of occlusion. No dissection or arterial injury. LUNGS AND MEDIASTINUM: Right chest wall port-a-cath is partially visualized. Sequelae of median sternotomy. Lungs are unremarkable. SOFT TISSUES: Bilateral lens replacement. Mucosal thickening and mucous retention cyst in the left maxillary sinus. No acute abnormality. BONES: No acute abnormality. LIMITATIONS/ARTIFACTS AND INCIDENTAL INTRACRANIAL FINDINGS: Limited visualization of intracranial structures. There is a small remote infarct noted within the superior right cerebellum. IMPRESSION: 1. Mixed atherosclerotic plaque at the right carotid bifurcation and bulb resulting in approximately 55% stenosis of the right ICA origin. 2. Moderate stenosis at the right vertebral artery origin. 3. Additional atherosclerosis as above. 4. 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 Electronically signed by: Donnice Mania MD 12/10/2023 10:55 AM EDT RP Workstation: [id number]   CT Angio Abd/Pel W and/or Wo Contrast Result Date: 12/09/2023 CLINICAL DATA:  History of left inguinal hernia repair with hematoma. Suspicion for active bleeding. EXAM: CTA ABDOMEN AND PELVIS WITHOUT AND WITH CONTRAST TECHNIQUE: Multidetector CT imaging of the abdomen and pelvis was performed using the  standard protocol during bolus administration of intravenous contrast. Multiplanar reconstructed images and MIPs were obtained and reviewed to evaluate the vascular anatomy. RADIATION DOSE REDUCTION: This exam was performed according to the departmental dose-optimization program which includes automated exposure control, adjustment of the mA and/or kV according to patient size and/or use of iterative reconstruction technique. CONTRAST:  OMNIPAQUE  IOHEXOL  350 MG/ML SOLN COMPARISON:  Chest CT scan earlier, same date. Prior abdominal CT scan and 07/17/2023 FINDINGS: VASCULAR Aorta: Stable moderate to advanced age related atherosclerotic calcifications but no aneurysm. Sizable ulcerated plaque or focal dissection noted involving the posterior aspect of the lower abdominal aorta on the left side measuring approximately 2.2 cm. Celiac: Atherosclerotic calcifications but no significant stenosis or dissection. SMA: Atherosclerotic calcifications but no significant stenosis. Renals: 2 renal arteries bilaterally. Two right and 3 left renal arteries no significant stenosis. Scattered atherosclerotic calcifications. IMA: Patent Inflow: Moderate atherosclerotic calcifications but no stenosis or dissection. Proximal Outflow: No significant findings. Scattered calcifications. Veins: No significant findings. Review of the MIP images confirms the above findings. NON-VASCULAR Lower chest: No acute pulmonary findings. Pulmonary scarring changes. Hepatobiliary: No hepatic lesions or intrahepatic biliary dilatation. Gallbladder is unremarkable. No common bile duct dilatation. Pancreas: Unremarkable. No pancreatic ductal dilatation or surrounding inflammatory changes. Spleen: Normal in size without focal abnormality. Adrenals/Urinary Tract: Adrenal glands and kidneys are unremarkable. There is contrast in the renal collecting systems from prior CT scan. Bilateral renal cysts, unchanged. The bladder is unremarkable. Stomach/Bowel:  The stomach, duodenum, small bowel and colon are grossly normal without oral contrast. No inflammatory changes, mass lesions or obstructive findings. The appendix is normal. Lymphatic: No abdominal or pelvic lymphadenopathy. Reproductive: Enlarged prostate gland. The seminal vesicles are unremarkable. Other: There is a sizable left groin hematoma measuring approximately 7.8 x 2.7 cm.  This is anterior to the inguinal canal. No extravasation of contrast material to suggest active bleeding. Small tortuous vessels in the spermatic cords bilaterally. Musculoskeletal: No significant bony findings. IMPRESSION: 1. Sizable left groin hematoma but no extravasation of contrast material to suggest active bleeding. 2. Sizable ulcerated plaque or focal dissection involving the posterior aspect of the lower abdominal aorta on the left side measuring approximately 2.2 cm. 3. No acute abdominal/pelvic findings, mass lesions or adenopathy. 4. Enlarged prostate gland. 5. Aortic atherosclerosis. Aortic Atherosclerosis (ICD10-I70.0). Electronically Signed   By: MYRTIS Stammer M.D.   On: 12/09/2023 18:02   CT Angio Chest PE W and/or Wo Contrast Result Date: 12/09/2023 CLINICAL DATA:  Pulmonary embolism (PE) suspected, high prob. Near syncopal episode. History of tongue carcinoma. * Tracking Code: BO * EXAM: CT ANGIOGRAPHY CHEST WITH CONTRAST TECHNIQUE: Multidetector CT imaging of the chest was performed using the standard protocol during bolus administration of intravenous contrast. Multiplanar CT image reconstructions and MIPs were obtained to evaluate the vascular anatomy. RADIATION DOSE REDUCTION: This exam was performed according to the departmental dose-optimization program which includes automated exposure control, adjustment of the mA and/or kV according to patient size and/or use of iterative reconstruction technique. CONTRAST:  75mL OMNIPAQUE  IOHEXOL  350 MG/ML SOLN COMPARISON:  CT angiography chest from 07/17/2023. FINDINGS:  Cardiovascular: No evidence of embolism to the proximal subsegmental pulmonary artery level. Mild cardiomegaly. No pericardial effusion. No aortic aneurysm. There are coronary artery calcifications, in keeping with coronary artery disease. There are also mild peripheral atherosclerotic vascular calcifications of thoracic aorta and its major branches. Mediastinum/Nodes: Visualized thyroid  gland appears grossly unremarkable. No solid / cystic mediastinal masses. The esophagus is nondistended precluding optimal assessment. No axillary, mediastinal or hilar lymphadenopathy by size criteria. Lungs/Pleura: The central tracheo-bronchial tree is patent. There is mild, smooth, circumferential thickening of the segmental and subsegmental bronchial walls, throughout bilateral lungs, which is nonspecific. Findings are most commonly seen with bronchitis or reactive airway disease, such as asthma. There are dependent changes in bilateral lungs. No mass or consolidation. No pleural effusion or pneumothorax. There is a new 5 x 6 mm ground-glass nodule in the right upper lobe (series 5, image 62). Upper Abdomen: Visualized upper abdominal viscera within normal limits. Musculoskeletal: The visualized soft tissues of the chest wall are grossly unremarkable. No suspicious osseous lesions. There are mild to moderate multilevel degenerative changes in the visualized spine. Review of the MIP images confirms the above findings. IMPRESSION: 1. No embolism to the proximal subsegmental pulmonary artery level. No lung mass, consolidation, pleural effusion or pneumothorax. 2. There is a new 5 x 6 mm ground-glass nodule in the right upper lobe. Consider short-term follow-up examination in 3 months to document resolution versus stability. 3. Multiple other nonacute observations, as described above. Aortic Atherosclerosis (ICD10-I70.0). Electronically Signed   By: Ree Molt M.D.   On: 12/09/2023 15:51    Assessment/Plan  Carotid  stenosis He also underwent a CT angiogram of the neck.  This was estimated at a 55% right carotid artery stenosis which I think is a little higher than I would have reported this.  This is also a little higher than his catheter-based angiogram suggested but still below the threshold for consideration for repair.  Left carotid stenosis was quite mild. We will follow his carotid's disease with duplex going forward.  I will plan a carotid duplex in 3 to 4 months.  Essential hypertension blood pressure control important in reducing the progression of atherosclerotic disease. On appropriate  oral medications.     Pulmonary embolus (HCC) Had pulmonary thrombectomy about 8 months ago and doing well.  He continues on Eliquis .  No recurrent PE seen on his CT angiogram.   Coronary artery disease Multiple previous cardiac issues and avoidance of general anesthesia would be preferred if he needs carotid intervention in the future   Cancer of base of tongue (HCC) Scan was actually to follow this.  Follows with oncology.  Could be a contributing factor.  Selinda Gu, MD  12/27/2023 2:52 PM    This note was created with Dragon medical transcription system.  Any errors from dictation are purely unintentional

## 2023-12-27 NOTE — Assessment & Plan Note (Signed)
 He also underwent a CT angiogram of the neck.  This was estimated at a 55% right carotid artery stenosis which I think is a little higher than I would have reported this.  This is also a little higher than his catheter-based angiogram suggested but still below the threshold for consideration for repair.  Left carotid stenosis was quite mild. We will follow his carotid's disease with duplex going forward.  I will plan a carotid duplex in 3 to 4 months.

## 2023-12-28 ENCOUNTER — Other Ambulatory Visit: Payer: Self-pay

## 2023-12-29 ENCOUNTER — Ambulatory Visit (INDEPENDENT_AMBULATORY_CARE_PROVIDER_SITE_OTHER): Admitting: General Surgery

## 2023-12-29 ENCOUNTER — Encounter: Payer: Self-pay | Admitting: General Surgery

## 2023-12-29 VITALS — BP 133/81 | HR 97 | Temp 98.4°F | Ht 72.0 in | Wt 210.4 lb

## 2023-12-29 DIAGNOSIS — Z09 Encounter for follow-up examination after completed treatment for conditions other than malignant neoplasm: Secondary | ICD-10-CM

## 2023-12-29 DIAGNOSIS — K4090 Unilateral inguinal hernia, without obstruction or gangrene, not specified as recurrent: Secondary | ICD-10-CM

## 2023-12-29 NOTE — Patient Instructions (Signed)

## 2023-12-31 ENCOUNTER — Inpatient Hospital Stay
Admission: EM | Admit: 2023-12-31 | Discharge: 2024-01-02 | DRG: 309 | Disposition: A | Discharge status: Other Acute Inpatient Hospital | Attending: Obstetrics and Gynecology | Admitting: Obstetrics and Gynecology

## 2023-12-31 ENCOUNTER — Other Ambulatory Visit: Payer: Self-pay

## 2023-12-31 ENCOUNTER — Encounter: Payer: Self-pay | Admitting: Emergency Medicine

## 2023-12-31 ENCOUNTER — Emergency Department

## 2023-12-31 ENCOUNTER — Ambulatory Visit
Admission: EM | Admit: 2023-12-31 | Discharge: 2023-12-31 | Disposition: A | Discharge status: Home / Self Care | Attending: Emergency Medicine | Admitting: Emergency Medicine

## 2023-12-31 DIAGNOSIS — N400 Enlarged prostate without lower urinary tract symptoms: Secondary | ICD-10-CM | POA: Diagnosis present

## 2023-12-31 DIAGNOSIS — Z8249 Family history of ischemic heart disease and other diseases of the circulatory system: Secondary | ICD-10-CM

## 2023-12-31 DIAGNOSIS — I5020 Unspecified systolic (congestive) heart failure: Secondary | ICD-10-CM

## 2023-12-31 DIAGNOSIS — E785 Hyperlipidemia, unspecified: Secondary | ICD-10-CM | POA: Diagnosis present

## 2023-12-31 DIAGNOSIS — E782 Mixed hyperlipidemia: Secondary | ICD-10-CM

## 2023-12-31 DIAGNOSIS — I5042 Chronic combined systolic (congestive) and diastolic (congestive) heart failure: Secondary | ICD-10-CM | POA: Diagnosis present

## 2023-12-31 DIAGNOSIS — Z923 Personal history of irradiation: Secondary | ICD-10-CM | POA: Diagnosis not present

## 2023-12-31 DIAGNOSIS — Z95828 Presence of other vascular implants and grafts: Secondary | ICD-10-CM | POA: Diagnosis not present

## 2023-12-31 DIAGNOSIS — E039 Hypothyroidism, unspecified: Secondary | ICD-10-CM | POA: Diagnosis present

## 2023-12-31 DIAGNOSIS — N1831 Chronic kidney disease, stage 3a: Secondary | ICD-10-CM | POA: Diagnosis present

## 2023-12-31 DIAGNOSIS — I453 Trifascicular block: Secondary | ICD-10-CM | POA: Diagnosis present

## 2023-12-31 DIAGNOSIS — I480 Paroxysmal atrial fibrillation: Secondary | ICD-10-CM | POA: Diagnosis present

## 2023-12-31 DIAGNOSIS — R55 Syncope and collapse: Secondary | ICD-10-CM | POA: Diagnosis not present

## 2023-12-31 DIAGNOSIS — R42 Dizziness and giddiness: Secondary | ICD-10-CM | POA: Diagnosis not present

## 2023-12-31 DIAGNOSIS — Z951 Presence of aortocoronary bypass graft: Secondary | ICD-10-CM | POA: Diagnosis not present

## 2023-12-31 DIAGNOSIS — Z961 Presence of intraocular lens: Secondary | ICD-10-CM | POA: Diagnosis present

## 2023-12-31 DIAGNOSIS — I4891 Unspecified atrial fibrillation: Secondary | ICD-10-CM | POA: Diagnosis not present

## 2023-12-31 DIAGNOSIS — Z79890 Hormone replacement therapy (postmenopausal): Secondary | ICD-10-CM

## 2023-12-31 DIAGNOSIS — I2510 Atherosclerotic heart disease of native coronary artery without angina pectoris: Secondary | ICD-10-CM | POA: Diagnosis present

## 2023-12-31 DIAGNOSIS — Z7901 Long term (current) use of anticoagulants: Secondary | ICD-10-CM | POA: Diagnosis not present

## 2023-12-31 DIAGNOSIS — I252 Old myocardial infarction: Secondary | ICD-10-CM

## 2023-12-31 DIAGNOSIS — I4719 Other supraventricular tachycardia: Secondary | ICD-10-CM | POA: Diagnosis not present

## 2023-12-31 DIAGNOSIS — Z9842 Cataract extraction status, left eye: Secondary | ICD-10-CM

## 2023-12-31 DIAGNOSIS — Z823 Family history of stroke: Secondary | ICD-10-CM

## 2023-12-31 DIAGNOSIS — Z86711 Personal history of pulmonary embolism: Secondary | ICD-10-CM

## 2023-12-31 DIAGNOSIS — Z9841 Cataract extraction status, right eye: Secondary | ICD-10-CM

## 2023-12-31 DIAGNOSIS — Z7982 Long term (current) use of aspirin: Secondary | ICD-10-CM | POA: Diagnosis not present

## 2023-12-31 DIAGNOSIS — I130 Hypertensive heart and chronic kidney disease with heart failure and stage 1 through stage 4 chronic kidney disease, or unspecified chronic kidney disease: Secondary | ICD-10-CM | POA: Diagnosis present

## 2023-12-31 DIAGNOSIS — I495 Sick sinus syndrome: Secondary | ICD-10-CM | POA: Diagnosis present

## 2023-12-31 DIAGNOSIS — G8929 Other chronic pain: Secondary | ICD-10-CM | POA: Diagnosis present

## 2023-12-31 DIAGNOSIS — Z79899 Other long term (current) drug therapy: Secondary | ICD-10-CM | POA: Diagnosis not present

## 2023-12-31 DIAGNOSIS — R251 Tremor, unspecified: Secondary | ICD-10-CM | POA: Diagnosis not present

## 2023-12-31 DIAGNOSIS — Z8673 Personal history of transient ischemic attack (TIA), and cerebral infarction without residual deficits: Secondary | ICD-10-CM

## 2023-12-31 DIAGNOSIS — Z87891 Personal history of nicotine dependence: Secondary | ICD-10-CM | POA: Diagnosis not present

## 2023-12-31 DIAGNOSIS — I255 Ischemic cardiomyopathy: Secondary | ICD-10-CM | POA: Diagnosis present

## 2023-12-31 DIAGNOSIS — K760 Fatty (change of) liver, not elsewhere classified: Secondary | ICD-10-CM | POA: Diagnosis present

## 2023-12-31 DIAGNOSIS — I700 Atherosclerosis of aorta: Secondary | ICD-10-CM | POA: Diagnosis not present

## 2023-12-31 DIAGNOSIS — Z87442 Personal history of urinary calculi: Secondary | ICD-10-CM

## 2023-12-31 DIAGNOSIS — K219 Gastro-esophageal reflux disease without esophagitis: Secondary | ICD-10-CM | POA: Diagnosis present

## 2023-12-31 DIAGNOSIS — R002 Palpitations: Secondary | ICD-10-CM | POA: Diagnosis not present

## 2023-12-31 DIAGNOSIS — I442 Atrioventricular block, complete: Secondary | ICD-10-CM | POA: Diagnosis not present

## 2023-12-31 DIAGNOSIS — Z85810 Personal history of malignant neoplasm of tongue: Secondary | ICD-10-CM

## 2023-12-31 HISTORY — DX: Syncope and collapse: R55

## 2023-12-31 LAB — CBC WITH DIFFERENTIAL/PLATELET
Abs Immature Granulocytes: 0.04 K/uL (ref 0.00–0.07)
Basophils Absolute: 0 K/uL (ref 0.0–0.1)
Basophils Relative: 0 %
Eosinophils Absolute: 0.2 K/uL (ref 0.0–0.5)
Eosinophils Relative: 2 %
HCT: 38.1 % — ABNORMAL LOW (ref 39.0–52.0)
Hemoglobin: 12.2 g/dL — ABNORMAL LOW (ref 13.0–17.0)
Immature Granulocytes: 1 %
Lymphocytes Relative: 8 %
Lymphs Abs: 0.6 K/uL — ABNORMAL LOW (ref 0.7–4.0)
MCH: 30.9 pg (ref 26.0–34.0)
MCHC: 32 g/dL (ref 30.0–36.0)
MCV: 96.5 fL (ref 80.0–100.0)
Monocytes Absolute: 0.5 K/uL (ref 0.1–1.0)
Monocytes Relative: 8 %
Neutro Abs: 5.7 K/uL (ref 1.7–7.7)
Neutrophils Relative %: 81 %
Platelets: 129 K/uL — ABNORMAL LOW (ref 150–400)
RBC: 3.95 MIL/uL — ABNORMAL LOW (ref 4.22–5.81)
RDW: 13.4 % (ref 11.5–15.5)
WBC: 7.1 K/uL (ref 4.0–10.5)
nRBC: 0 % (ref 0.0–0.2)

## 2023-12-31 LAB — COMPREHENSIVE METABOLIC PANEL WITH GFR
ALT: 25 U/L (ref 0–44)
AST: 30 U/L (ref 15–41)
Albumin: 4.1 g/dL (ref 3.5–5.0)
Alkaline Phosphatase: 66 U/L (ref 38–126)
Anion gap: 11 (ref 5–15)
BUN: 20 mg/dL (ref 8–23)
CO2: 27 mmol/L (ref 22–32)
Calcium: 9.1 mg/dL (ref 8.9–10.3)
Chloride: 103 mmol/L (ref 98–111)
Creatinine, Ser: 1.42 mg/dL — ABNORMAL HIGH (ref 0.61–1.24)
GFR, Estimated: 51 mL/min — ABNORMAL LOW (ref >60)
Glucose, Bld: 101 mg/dL — ABNORMAL HIGH (ref 70–99)
Potassium: 4.2 mmol/L (ref 3.5–5.1)
Sodium: 141 mmol/L (ref 135–145)
Total Bilirubin: 0.8 mg/dL (ref 0.0–1.2)
Total Protein: 6.9 g/dL (ref 6.5–8.1)

## 2023-12-31 LAB — TROPONIN I (HIGH SENSITIVITY)
Troponin I (High Sensitivity): 20 ng/L — ABNORMAL HIGH (ref <18)
Troponin I (High Sensitivity): 32 ng/L — ABNORMAL HIGH (ref <18)

## 2023-12-31 LAB — PROTIME-INR
INR: 1 (ref 0.8–1.2)
Prothrombin Time: 13.7 s (ref 11.4–15.2)

## 2023-12-31 LAB — BRAIN NATRIURETIC PEPTIDE: B Natriuretic Peptide: 112.1 pg/mL — ABNORMAL HIGH (ref 0.0–100.0)

## 2023-12-31 MED ORDER — ASPIRIN 81 MG PO CHEW
81.0000 mg | CHEWABLE_TABLET | Freq: Every day | ORAL | Status: DC
  Administered 2024-01-01 – 2024-01-02 (×2): 81 mg
  Filled 2023-12-31 (×2): qty 1

## 2023-12-31 MED ORDER — ONDANSETRON HCL 4 MG/2ML IJ SOLN
4.0000 mg | Freq: Once | INTRAMUSCULAR | Status: DC
  Filled 2023-12-31: qty 2

## 2023-12-31 MED ORDER — SODIUM CHLORIDE 0.9 % IV BOLUS
500.0000 mL | Freq: Once | INTRAVENOUS | Status: AC
  Administered 2023-12-31: 500 mL via INTRAVENOUS

## 2023-12-31 MED ORDER — HYDRALAZINE HCL 20 MG/ML IJ SOLN: 10.0000 mg | Freq: Four times a day (QID) | INTRAMUSCULAR | Status: DC | PRN

## 2023-12-31 MED ORDER — APIXABAN 5 MG PO TABS
5.0000 mg | ORAL_TABLET | Freq: Two times a day (BID) | ORAL | Status: DC
  Administered 2023-12-31 – 2024-01-02 (×4): 5 mg via ORAL
  Filled 2023-12-31 (×4): qty 1

## 2023-12-31 MED ORDER — LEVOTHYROXINE SODIUM 100 MCG PO TABS
100.0000 ug | ORAL_TABLET | Freq: Every day | ORAL | Status: DC
  Administered 2024-01-01 – 2024-01-02 (×2): 100 ug via ORAL
  Filled 2023-12-31 (×2): qty 1

## 2023-12-31 MED ORDER — ALUM & MAG HYDROXIDE-SIMETH 200-200-20 MG/5ML PO SUSP
30.0000 mL | Freq: Once | ORAL | Status: AC
  Administered 2023-12-31: 30 mL via ORAL
  Filled 2023-12-31: qty 30

## 2023-12-31 MED ORDER — ENOXAPARIN SODIUM 40 MG/0.4ML IJ SOSY
40.0000 mg | PREFILLED_SYRINGE | INTRAMUSCULAR | Status: DC
  Administered 2023-12-31: 40 mg via SUBCUTANEOUS
  Filled 2023-12-31: qty 0.4

## 2023-12-31 MED ORDER — ATORVASTATIN CALCIUM 80 MG PO TABS
80.0000 mg | ORAL_TABLET | Freq: Every day | ORAL | Status: DC
  Administered 2024-01-01 – 2024-01-02 (×2): 80 mg via ORAL
  Filled 2023-12-31 (×2): qty 1

## 2023-12-31 MED ORDER — LIDOCAINE VISCOUS HCL 2 % MT SOLN
15.0000 mL | Freq: Once | OROMUCOSAL | Status: AC
  Administered 2023-12-31: 15 mL via ORAL
  Filled 2023-12-31: qty 15

## 2023-12-31 MED ORDER — FAMOTIDINE 20 MG PO TABS
20.0000 mg | ORAL_TABLET | Freq: Every day | ORAL | Status: DC
  Administered 2024-01-01 – 2024-01-02 (×2): 20 mg via ORAL
  Filled 2023-12-31 (×2): qty 1

## 2023-12-31 MED ORDER — ACETAMINOPHEN 325 MG PO TABS: 650.0000 mg | ORAL_TABLET | Freq: Four times a day (QID) | ORAL | Status: DC | PRN

## 2023-12-31 MED ORDER — FAMOTIDINE 20 MG PO TABS
20.0000 mg | ORAL_TABLET | Freq: Once | ORAL | Status: AC
  Administered 2023-12-31: 20 mg via ORAL
  Filled 2023-12-31: qty 1

## 2023-12-31 MED ORDER — SODIUM CHLORIDE 0.9 % IV SOLN: INTRAVENOUS | Status: AC

## 2023-12-31 MED ORDER — OXYCODONE HCL 5 MG PO TABS
5.0000 mg | ORAL_TABLET | Freq: Four times a day (QID) | ORAL | Status: DC | PRN
  Administered 2023-12-31 – 2024-01-02 (×3): 5 mg via ORAL
  Filled 2023-12-31 (×3): qty 1

## 2023-12-31 NOTE — ED Provider Notes (Signed)
 Consulted with and patient accepted to hospitalist by Dr. Dorinda  Near syncope. EKG  Admited. Dr. Clarine consult with Dr. Argentina (cardiology).   Dicky Anes, MD 12/31/23 [id number]

## 2023-12-31 NOTE — Consult Note (Signed)
 Cardiology Consultation:  Patient ID: Cory Hess MRN: [id number]; DOB: 1945-04-02  Admit date: 12/31/2023 Date of Consult: 12/31/2023  Primary Care Provider: Towana Small, FNP Primary Cardiologist: Lonni Hanson, MD  Primary Electrophysiologist:  None   Patient Profile:  Cory Hess is a 78 y.o. male with a hx of hypothyroidism, CAD status post CABG 08/2017, ischemic cardiomyopathy with recovered EF, HTN, HLD, prior PE with PEA arrest 03/2023 status post pulmonary thrombectomy, severe right carotid stenosis status post right CEA 10/2023 who is being seen today for the evaluation of near syncope.  History of Present Illness:  Cory Hess presents was recently just hospitalized with high-grade AV block in mid October.  At that time, he had presented to the ED with near syncope.  He has a known trifascicular block.  He was given a low-dose of carvedilol  and subsequently developed complete heart block which required transvenous pacing.  After beta-blocker washout, he was liberated from the TVP without PPM placement.  An echocardiogram showed normal LV function.  He was ultimately discharged with a monitor and instructions to avoid any further AV nodal blockers.  Patient reports today, he was at the [employer] and had similar symptoms.  However, his heart rates are now fast instead of slow.  He denies any syncope.  He has not been taking losartan .  On arrival to the ED today, heart rate 114, RR 16, 98% on room air.  BP 150/88.  Labs show stable hemoglobin of 12.2, platelets 129, normal coags.  Creatinine 1.42 (baseline ~1.2).  Troponin 20.  BNP 112.  ECG shows regularized tachycardia with known bifascicular block.  Difficult to tell whether this is sinus tachycardia versus an atrial tachycardia.  Would favor a ectopic atrial rhythm given slow sinus rates prior to discharge.  Past Medical History: Past Medical History:  Diagnosis Date   Abscess of right arm 03/2023   pt states had absess  to right arm after iv was inserted. Pt states he had a wound vac for 2 weeks   Alternating RBBB & LBBB    Ankle swelling 2024   Anticoagulated on apixaban     Aortic atherosclerosis    Arthritis of neck    Bilateral inguinal hernia    Bladder tumor    BPH (benign prostatic hyperplasia)    CAD (coronary artery disease)    a. 08/2017 STEMI/Cath: LM 80ost/d, LAD 100p, LCX 95 ost/p, RCA 60p, 49m, RPDA 80ost; b. 08/2017 CABG x 3: LIMA->LAD, VG->OM, VG->PDA; c. 03/2023 Cardiac Arrest/Cath: Occluded VG->RPDA, patent LIMA->LAD and VG->pLCX. No new native dzs->Med rx. EF 25-35%.   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   CVA (cerebral vascular accident) (HCC) 04/08/2023   a.) brain MRI 04/08/2023: 2 small acute infarctions affecting the cortical brain in the RIGHT frontal region.   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 08/2017 TEE: EF 30-35%, sept/ant/inf HK, apical AK. 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 12/2017 Echo: EF 45-50%, diff HK; c. 09/2022 Echo: EF 55-60%; d. 03/2023 Echo (in settng of PE): EF 40-45%, GrI DD (imaging poor); e. 03/2023 Echo w/ definity : EF 55-60%, nl RV fxn.   Hyperlipidemia LDL goal <70    Hypothyroidism    Ischemic cardiomyopathy    a. 08/2017 TEE: EF 30-35%; b. 12/2017 Echo: EF 45-50%; c. 09/2022 Echo: EF 55-60%.   Long-term use of aspirin  therapy    Nephrolithiasis    PEA (Pulseless electrical activity) (HCC)  04/05/2023   a.) witnessed arrest at [facility] and CPR initiated immediately --> ROSC achieved --> EMS transferred to [facility] with multiple episodes of PEA arrest en route (further CPR required) --> ROSC achieved in ED with further muliple epsides of recurrent PEA arrest lasting secs-mins --> TCP applied and patient taken to cath lab for [facility] and TVP placement   Port-A-Cath in place    Pulmonary embolism (HCC) 04/15/2023   a.) CTA chest 04/15/2023: extensive clot burden with an occlusive clot in the RIGHT upper lobe anterior and posterior segmental arteries, and a non occlusive thrombus in the RIGHT lower lobe main artery and superior and posterior basal segmental arteries --> required thrombectomy   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) LIMA-LAD, SVG-OM, SVG-PDA   Shingles 2018   Status post bilateral cataract extraction    STEMI (ST elevation myocardial infarction) (HCC) 08/27/2017   a.) LHC 08/27/2017: 80% o-dLM, 100% lPAD, 95% o-pLCx, 60% pRCA, 90% mRCA, 80% oRPDA-RPDA --> unable to cross lesions with wire --> IABP placed --> CVTS consulted and patient [facility]; b.) s/p 3v CABG 08/27/2017: LIMA-LAD, SVG-OM, SVG-RPDA   Thrombocytopenia    Vertigo     Past Surgical History: Past Surgical History:  Procedure Laterality Date   CARDIAC CATHETERIZATION     CAROTID PTA/STENT INTERVENTION Right 10/26/2023   Procedure: CAROTID PTA/STENT INTERVENTION;  Surgeon: Marea Selinda RAMAN, MD;  Location: [facility] INVASIVE CV LAB;  Service: Cardiovascular;  Laterality: Right;   CATARACT EXTRACTION W/PHACO Left 07/18/2018   Procedure: CATARACT EXTRACTION PHACO AND INTRAOCULAR LENS PLACEMENT (IOC)  LEFT;  Surgeon: Myrna Adine Anes, MD;  Location: [facility];  Service: Ophthalmology;  Laterality: Left;   CATARACT EXTRACTION W/PHACO Right 08/14/2018   Procedure: CATARACT EXTRACTION PHACO AND INTRAOCULAR LENS PLACEMENT (IOC)  RIGHT;  Surgeon: Myrna Adine Anes, MD;  Location: [facility] SURGERY [facility]TR;   Service: Ophthalmology;  Laterality: Right;   CORONARY ARTERY BYPASS GRAFT N/A 08/27/2017   Procedure: CORONARY ARTERY BYPASS GRAFTING (CABG) ON PUMP USING LEFT INTERNAL MAMMARY ARTERY AND LEFT GREATER SAPHENOUS VEIN VIA ENDOVEIN HARVEST;  Surgeon: Lucas Dorise POUR, MD;  Location: [facility] OR;  Service: Open Heart Surgery;  Laterality: N/A;   CORONARY/GRAFT ACUTE MI REVASCULARIZATION N/A 08/27/2017   Procedure: Coronary/Graft Acute MI Revascularization;  Surgeon: Darron Deatrice LABOR, MD;  Location: [facility] INVASIVE CV LAB;  Service: Cardiovascular;  Laterality: N/A;   CYSTOSCOPY W/ RETROGRADES Bilateral 12/25/2018   Procedure: CYSTOSCOPY WITH RETROGRADE PYELOGRAM;  Surgeon: Penne Knee, MD;  Location: [facility] ORS;  Service: Urology;  Laterality: Bilateral;   FRACTURE SURGERY Right 1958   arm and left wrist compound fracture, no metal   HERNIA REPAIR Right    inguinial   INGUINAL HERNIA REPAIR Left 12/05/2023   Procedure: REPAIR, HERNIA, INGUINAL, ADULT;  Surgeon: Marinda Jayson KIDD, MD;  Location: [facility] ORS;  Service: General;  Laterality: Left;  open procedure  with mesh   INSERTION OF MESH  12/05/2023   Procedure: INSERTION OF MESH;  Surgeon: Marinda Jayson KIDD, MD;  Location: [facility] ORS;  Service: General;;   IR IMAGING GUIDED PORT INSERTION  10/15/2022   LEFT HEART CATH AND CORONARY ANGIOGRAPHY N/A 08/27/2017   Procedure: LEFT HEART CATH AND CORONARY ANGIOGRAPHY;  Surgeon: Darron Deatrice LABOR, MD;  Location: [facility] INVASIVE CV LAB;  Service: Cardiovascular;  Laterality: N/A;   PULMONARY THROMBECTOMY Bilateral 04/18/2023   Procedure: PULMONARY THROMBECTOMY;  Surgeon: Marea Selinda RAMAN, MD;  Location: [facility] INVASIVE CV LAB;  Service: Cardiovascular;  Laterality: Bilateral;   RIGHT/LEFT HEART CATH AND CORONARY/GRAFT ANGIOGRAPHY N/A 04/05/2023   Procedure: RIGHT/LEFT HEART CATH AND CORONARY/GRAFT ANGIOGRAPHY;  Surgeon: Mady Bruckner, MD;  Location: [facility] INVASIVE CV LAB;  Service: Cardiovascular;  Laterality: N/A;    TEMPORARY PACEMAKER N/A 04/05/2023   Procedure: TEMPORARY PACEMAKER;  Surgeon: Mady Bruckner, MD;  Location: [facility] INV[facility] CV [facility];  Service: Cardiovascular;  Laterality: N/A;   TEMPORARY PACEMAKER N/A 12/10/2023   Procedure: TEMPORARY PACEMAKER;  Surgeon: Florencio Cara BIRCH, MD;  Location: [facility] INV[facility] CV LAB;  Service: Cardiovascular;  Laterality: N/A;   TRANSURETHRAL RESECTION OF BLADDER TUMOR WITH MITOMYCIN -C N/A 12/25/2018   Procedure: TRANSURETHRAL RESECTION OF BLADDER TUMOR WITH Gemcitabine ;  Surgeon: Penne Knee, MD;  Location: [facility] ORS;  Service: Urology;  Laterality: N/A;     Allergies:    No Known Allergies  Social History:   Social History   Socioeconomic History   Marital status: Single    Spouse name: Not on file   Number of children: Not on file   Years of education: Not on file   Highest education level: Not on file  Occupational History   Occupation: supervision, research scientist (life sciences)    Comment: retired  Tobacco Use   Smoking status: Former    Current packs/day: 0.00    Types: Cigarettes    Quit date: 2000    Years since quitting: 25.8    Passive exposure: Past   Smokeless tobacco: Former    Types: Snuff  Vaping Use   Vaping status: Never Used  Substance and Sexual Activity   Alcohol use: Not Currently   Drug use: Not Currently   Sexual activity: Not Currently  Other Topics Concern   Not on file  Social History Narrative   Lives alone   Social Drivers of Health   Financial Resource Strain: Low Risk  (11/06/2018)   Overall Financial Resource Strain (CARDIA)    Difficulty of Paying Living Expenses: Not hard at all  Food Insecurity: No Food Insecurity (12/09/2023)   Hunger Vital Sign    Worried About Running Out of Food in the Last Year: Never true    Ran Out of Food in the Last Year: Never true  Transportation Needs: No Transportation Needs (12/09/2023)   PRAPARE - Administrator, Civil Service (Medical): No    Lack of  Transportation (Non-Medical): No  Physical Activity: Inactive (11/06/2018)   Exercise Vital Sign    Days of Exercise per Week: 0 days    Minutes of Exercise per Session: 0 min  Stress: No Stress Concern Present (11/06/2018)   Cory Hess [employer] Occupational Health - Occupational Stress Questionnaire    Feeling of Stress : Not at all  Social Connections: Moderately Integrated (12/09/2023)   Social Connection and Isolation Panel    Frequency of Communication with Friends and Family: Three times a week    Frequency of Social Gatherings with  Friends and Family: Once a week    Attends Religious Services: More than 4 times per year    Active Member of [employer] or Organizations: Yes    Attends Engineer, Structural: More than 4 times per year    Marital Status: Never married  Intimate Partner Violence: Not At Risk (12/09/2023)   Humiliation, Afraid, Rape, and Kick questionnaire    Fear of Current or Ex-Partner: No    Emotionally Abused: No    Physically Abused: No    Sexually Abused: No     Family History:    Family History  Problem Relation Age of Onset   Heart failure Mother    Lung disease Father    Stroke Father    Cervical cancer Maternal Grandmother      ROS:  All other ROS reviewed and negative. Pertinent positives noted in the HPI.     Physical Exam/Data:   Vitals:   12/31/23 1423 12/31/23 1432 12/31/23 1448  BP:  (!) 150/88   Pulse:  (!) 114   Resp:  16   Temp:   99.2 F (37.3 C)  TempSrc:   Oral  SpO2:  98%   Weight: 95.3 kg    Height: 6' (1.829 m)     No intake or output data in the 24 hours ending 12/31/23 1557     12/31/2023    2:23 PM 12/31/2023   12:58 PM 12/29/2023    8:49 AM  Last 3 Weights  Weight (lbs) 210 lb 210 lb 5.1 oz 210 lb 6.4 oz  Weight (kg) 95.255 kg 95.4 kg 95.437 kg    Body mass index is 28.48 kg/m.  General: no acute distress  Neck: no JVD Cardiac: Normal S1, S2; RRR; no murmurs, rubs, or gallops Lungs: Clear to auscultation  bilaterally, no wheezing, rhonchi or rales  Ext: No edema, pulses 2+ Skin: Warm and dry Neuro: Alert and oriented to person, place, time, and situation Psych: Normal mood and affect   EKG:  The EKG was personally reviewed and demonstrates: Sinus tachycardia or atrial tachycardia with bifascicular block Telemetry:  Telemetry was personally reviewed and demonstrates: Sinus tachycardia or atrial tachycardia with bifascicular block  Relevant CV Studies: - Zio monitor 12/17/2023 pending - TTE 12/11/2023 normal biventricular function, grade 1 diastolic dysfunction, no significant valvular disease - Cath 03/2023 patent LIMA to LAD, patent VG LCx, occluded VG PDA  Assessment and Plan:  Near syncope Trifascicular block Recent complete heart block requiring TVP Paroxysmal atrial fibrillation Atrial tachycardia Tachy-brady syndrome Sinus node dysfunction Patient has a long history of significant infranodal disease and known trifascicular block.  He was recently hospitalized here following complete heart block with very low doses of Coreg  (6.25mg ) which ultimately required transvenous pacing.  He was fortunately able to be liberated from TVP after beta-blocker washout and was sent home with a monitor.  Unfortunately, he is presenting with near syncope again.  At this time, however, he is tachycardic.  Difficult to tell what rhythm he is in given his severe conduction disease, though likely an atrial tachycardia versus sinus tachycardia.  At this point, I would be very hesitant to try any kind of AV nodal blockers without a backup pacing mechanism given recent events.  Plan: - Would search for any potential drivers of atrial or sinus tachycardia and correct - As above, would be very hesitant to give any AV nodal blockers given severe infranodal conduction disease and recent episode of complete heart block requiring TVP with  low doses of carvedilol .  - Will need EP consult for PPM consideration on  Monday.  Would allow permissive tachycardia for now until backup pacing mechanism is in place  CAD status post CABG Heart failure with recovered ejection fraction Three-vessel CABG in 2019.  LV ejection fraction was mildly reduced prior to CABG and has since recovered.  He is not complaining of any heart failure symptoms.  Recent echocardiogram 11/2023 with normal biventricular function.  Plan: - Continue ASA 81 mg daily - Continue Lipitor  80 mg daily - Hold AV nodal blockers as above - Would continue to hold losartan  for now; can restart low-dose if BP continues to be high. Can restart once his EP issues are sorted out.  HLD Last LDL 25 03/2023.  LDL goal less than 55 given prior CABG.  As above, continue Lipitor  80 mg daily.     For questions or updates, please contact [facility] Please consult [email or web address] for contact info under   Signed, Caron Poser, MD  [facility]  [facility]  12/31/2023 3:57 PM

## 2023-12-31 NOTE — Discharge Instructions (Addendum)
 Given your significant cardiac and vascular history I do feel that you need to be evaluated in the emergency department.  That is why we are transferring you to [facility] by EMS.

## 2023-12-31 NOTE — ED Triage Notes (Signed)
 Patient was at a Restaurant about 45 min ago, and started feeling lightheaded and shaky.  Patient states that he did not pass out.  Patient denies any chest pain.  Patient denies N/V.

## 2023-12-31 NOTE — ED Provider Notes (Signed)
 [facility] Provider Note    Event Date/Time   First MD Initiated Contact with Patient 12/31/23 1422     (approximate)   History   Near Syncope  Pt arrives via [facility] from [facility] urgent care after pt had a near syncopal episode at a restaurant. Pt started feeling ringing in his ears and felt like he was going to pass out. Ptt does not take any BP meds currently.  EMS vitals: 180/109 BP 98.32F temp 103 CBG 104-113 HR 98% SPO2   HPI Cory Hess is a 78 y.o. male PMH multiple medical comorbidities including CAD with prior CABG x3 (2018), HFrEF 2/2 ICM, prior CVA, prior PE w/ PEA arrest (03/2023), carotid stenosis s/p R ICA stent (10/26/2023), paroxysmal A-fib on Eliquis , presents for evaluation of near syncopal episode after being seen in urgent care - On my evaluation, patient states he was at [facility] drinking couple coffee and felt as if he was going to pass out.  Denies any chest or abdominal pain.  No shortness of breath.  Is taking all of his medications as prescribed.  Has otherwise been in his usual state of health.    Per chart review, patient was recently admitted 10/17-10/21/2025 after presenting for a near syncopal episode.  Found to have complete heart block, temporary pacemaker inserted.  Went to ICU, received calcium  gluconate, atropine , dopamine . Temporary pacing wires placed and subsequently removed.  Stopped coreg  or any meds that can cause bradycardia, discharged w/ zio monitor.  Echo with normal EF.       Physical Exam   Triage Vital Signs: BP (!) 150/88   Pulse (!) 114   Temp 99.2 F (37.3 C) (Oral)   Resp 16   Ht 6' (1.829 m)   Wt 95.3 kg   SpO2 98%   BMI 28.48 kg/m     Most recent vital signs: Vitals:   12/31/23 1432 12/31/23 1448  BP: (!) 150/88   Pulse: (!) 114   Resp: 16   Temp:  99.2 F (37.3 C)  SpO2: 98%      General: Awake, no distress.  CV:  Good peripheral perfusion.  Tachycardic, regular rhythm, RP  2+ Resp:  Normal effort. CTAB Abd:  No distention. Nontender to deep palpation throughout    ED Results / Procedures / Treatments   Labs (all labs ordered are listed, but only abnormal results are displayed) Labs Reviewed  COMPREHENSIVE METABOLIC PANEL WITH GFR - Abnormal; Notable for the following components:      Result Value   Glucose, Bld 101 (*)    Creatinine, Ser 1.42 (*)    GFR, Estimated 51 (*)    All other components within normal limits  CBC WITH DIFFERENTIAL/PLATELET - Abnormal; Notable for the following components:   RBC 3.95 (*)    Hemoglobin 12.2 (*)    HCT 38.1 (*)    Platelets 129 (*)    Lymphs Abs 0.6 (*)    All other components within normal limits  BRAIN NATRIURETIC PEPTIDE - Abnormal; Notable for the following components:   B Natriuretic Peptide 112.1 (*)    All other components within normal limits  TROPONIN I (HIGH SENSITIVITY) - Abnormal; Notable for the following components:   Troponin I (High Sensitivity) 20 (*)    All other components within normal limits  PROTIME-INR     EKG  Ecg = sinus tach vs afib, rate 111, RBBB + LAFB   RADIOLOGY Radiology interpreted by myself and radiology  report reviewed.  No acute pathology identified.  PROCEDURES:  Critical Care performed: No  Procedures   MEDICATIONS ORDERED IN ED: Medications  ondansetron  (ZOFRAN ) injection 4 mg (has no administration in time range)  sodium chloride  0.9 % bolus 500 mL (has no administration in time range)  famotidine  (PEPCID ) tablet 20 mg (has no administration in time range)  alum & mag hydroxide-simeth (MAALOX/MYLANTA) 200-200-20 MG/5ML suspension 30 mL (has no administration in time range)    And  lidocaine  (XYLOCAINE ) 2 % viscous mouth solution 15 mL (has no administration in time range)     IMPRESSION / MDM / ASSESSMENT AND PLAN / ED COURSE  I reviewed the triage vital signs and the nursing notes.                              DDX/MDM/AP: Differential  diagnosis includes, but is not limited to, primary arrhythmia, consider underlying electrolyte abnormality or anemia, considered but doubt ACS, CHF exacerbation.  Plan: - Labs EKG - Cardiac monitor - Will avoid aggressive rate control given concerning recent hospitalization history - Will discuss with cardiology  Patient's presentation is most consistent with acute presentation with potential threat to life or bodily function.  The patient is on the cardiac monitor to evaluate for evidence of arrhythmia and/or significant heart rate changes.  ED course below.  Workup with evidence of mild AKI, giving IV fluid.  Discussed with on-call cardiologist who does recommend admission for further telemetry monitoring, they may discuss with EP cardiology as needed.  Agreed to defer rate control at this time.  Admitted to hospitalist service.  Clinical Course as of 12/31/23 1555  Sat Dec 31, 2023  1512 CBC with no leukocytosis, stable mild anemia. [MM]  1525 D/w Dr. [place] of cards - agrees deferring rate control at this time given recent history -Recommend admission for further monitoring, they may discuss with EP as needed  Hospitalist consult order placed  Mild AKI, giving IV fluid, recent echo with normal EF [MM]  1529 CXR: IMPRESSION: No active disease.   [MM]    Clinical Course User Index [MM] Clarine Ozell LABOR, MD     FINAL CLINICAL IMPRESSION(S) / ED DIAGNOSES   Final diagnoses:  Near syncope  Atrial fibrillation with RVR (HCC)     Rx / DC Orders   ED Discharge Orders     None        Note:  This document was prepared using Dragon voice recognition software and may include unintentional dictation errors.   Clarine Ozell LABOR, MD 12/31/23 [phone]

## 2023-12-31 NOTE — ED Triage Notes (Signed)
 Pt arrives via [facility] from [facility] urgent care after pt had a near syncopal episode at a restaurant. Pt started feeling ringing in his ears and felt like he was going to pass out. Ptt does not take any BP meds currently.  EMS vitals: 180/109 BP 98.22F temp 103 CBG 104-113 HR 98% SPO2

## 2023-12-31 NOTE — ED Notes (Signed)
 Patient is being discharged from the Urgent Care and sent to the [facility] Emergency Department via EMS . Per Venetia Motto, NP, patient is in need of higher level of care due to Near Syncope. Patient is aware and verbalizes understanding of plan of care.  Vitals:   12/31/23 1300  BP: (!) 159/105  Pulse: (!) 107  Resp: 16  Temp: 98.1 F (36.7 C)  SpO2: 100%

## 2023-12-31 NOTE — ED Provider Notes (Addendum)
 [facility]    CSN: [id number] Arrival date & time: 12/31/23  1248      History   Chief Complaint Chief Complaint  Patient presents with   Dizziness   Shaking    HPI Cory Hess is a 78 y.o. male.   HPI  78 year old male with past medical history significant for STEMI, complete heart block, heart failure with reduced ejection fraction, cardiac arrest, and CVA presents for evaluation of lightheadedness and shakiness that started approximately 1 hour ago while he was at [facility].  He reports that he had an episode where he was dizzy and disoriented that resolved quickly.  At that time he checked his heart rate and was running between 140 and 100 50 bpm.  He felt like he was going to pass out and had ringing in his ears but did not suffer any syncope.  He denies any chest pain, shortness breath, headache, changes in vision.  Past Medical History:  Diagnosis Date   Abscess of right arm 03/2023   pt states had absess to right arm after iv was inserted. Pt states he had a wound vac for 2 weeks   Alternating RBBB & LBBB    Ankle swelling 2024   Anticoagulated on apixaban     Aortic atherosclerosis    Arthritis of neck    Bilateral inguinal hernia    Bladder tumor    BPH (benign prostatic hyperplasia)    CAD (coronary artery disease)    a. 08/2017 STEMI/Cath: LM 80ost/d, LAD 100p, LCX 95 ost/p, RCA 60p, 17m, RPDA 80ost; b. 08/2017 CABG x 3: LIMA->LAD, VG->OM, VG->PDA; c. 03/2023 Cardiac Arrest/Cath: Occluded VG->RPDA, patent LIMA->LAD and VG->pLCX. No new native dzs->Med rx. EF 25-35%.   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   CVA (cerebral vascular accident) (HCC) 04/08/2023   a.) brain MRI 04/08/2023: 2 small acute infarctions affecting the cortical brain in the RIGHT frontal region.   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 08/2017 TEE: EF 30-35%, sept/ant/inf HK, apical AK. 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 12/2017 Echo: EF 45-50%, diff HK; c. 09/2022 Echo: EF 55-60%; d. 03/2023 Echo (in settng of PE): EF 40-45%, GrI DD (imaging poor); e. 03/2023 Echo w/ definity : EF 55-60%, nl RV fxn.   Hyperlipidemia LDL goal <70    Hypothyroidism    Ischemic cardiomyopathy    a. 08/2017 TEE: EF 30-35%; b. 12/2017 Echo: EF 45-50%; c. 09/2022 Echo: EF 55-60%.   Long-term use of aspirin  therapy    Nephrolithiasis    PEA (Pulseless electrical activity) (HCC) 04/05/2023   a.) witnessed arrest at [facility] and CPR initiated immediately --> ROSC achieved --> EMS transferred to [facility] with multiple episodes of PEA arrest en route (further CPR required) --> ROSC achieved in ED with further muliple epsides of recurrent PEA  arrest lasting secs-mins --> TCP applied and patient taken to cath lab for [facility] and TVP placement   Port-A-Cath in place    Pulmonary embolism (HCC) 04/15/2023   a.) CTA chest 04/15/2023: extensive clot burden with an occlusive clot in the RIGHT upper lobe anterior and posterior segmental arteries, and a non occlusive thrombus in the RIGHT lower lobe main artery and superior and posterior basal segmental arteries --> required thrombectomy   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) LIMA-LAD, SVG-OM, SVG-PDA   Shingles 2018   Status post bilateral cataract extraction    STEMI (ST elevation myocardial infarction) (HCC) 08/27/2017   a.) LHC 08/27/2017: 80% o-dLM, 100% lPAD, 95% o-pLCx, 60% pRCA, 90% mRCA, 80%  oRPDA-RPDA --> unable to cross lesions with wire --> IABP placed --> CVTS consulted and patient transferred to [facility] b.) s/p 3v CABG 08/27/2017: LIMA-LAD, SVG-OM, SVG-RPDA   Thrombocytopenia    Vertigo     Patient Active Problem List   Diagnosis Date Noted   Cardiogenic shock (HCC) 12/10/2023   Acute blood loss anemia 12/09/2023   Near syncope 12/09/2023   Carotid stenosis 10/18/2023   Open arm wound, right, sequela 08/19/2023   Abscess of right arm 08/02/2023   Left inguinal hernia 08/02/2023   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   Acquired hypothyroidism 08/02/2023   Essential hypertension    Blood glucose elevated 07/06/2023   Does not have primary care provider 07/01/2023   Thrush 07/01/2023   Former smoker 07/01/2023   Pulmonary embolus (HCC) 04/15/2023   Acute respiratory failure with hypoxia and hypercapnia (HCC) 04/10/2023   Acute CVA (cerebrovascular accident) (HCC) 04/09/2023   Hyperkalemia 04/06/2023   Acute kidney injury 04/06/2023   Cardiac arrest (HCC) 04/05/2023   CHB (complete heart block) (HCC) 04/05/2023   Acute on chronic HFrEF (heart failure with reduced ejection fraction) (HCC) 04/05/2023   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   Hematuria 11/06/2018   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   Coronary artery disease 08/28/2017   STEMI (ST elevation myocardial infarction) [facility])     Past Surgical History:  Procedure Laterality Date   CARDIAC CATHETERIZATION     CAROTID PTA/STENT INTERVENTION Right 10/26/2023   Procedure: CAROTID PTA/STENT INTERVENTION;  Surgeon: Marea Selinda RAMAN, MD;  Location: [facility];  Service: Cardiovascular;  Laterality: Right;   CATARACT EXTRACTION W/PHACO Left 07/18/2018   Procedure: CATARACT EXTRACTION PHACO AND INTRAOCULAR LENS PLACEMENT (IOC)  LEFT;  Surgeon: Myrna Adine Anes, MD;  Location: [facility];  Service: Ophthalmology;  Laterality: Left;   CATARACT EXTRACTION W/PHACO Right 08/14/2018   Procedure: CATARACT EXTRACTION  PHACO AND INTRAOCULAR LENS PLACEMENT (IOC)  RIGHT;  Surgeon: Myrna Adine Anes, MD;  Location: [facility];  Service: Ophthalmology;  Laterality: Right;   CORONARY ARTERY BYPASS GRAFT N/A 08/27/2017   Procedure: CORONARY ARTERY BYPASS GRAFTING (CABG) ON PUMP USING LEFT INTERNAL MAMMARY ARTERY AND LEFT GREATER SAPHENOUS VEIN VIA ENDOVEIN HARVEST;  Surgeon: Lucas Dorise POUR, MD;  Location: [facility] OR;  Service: Open Heart Surgery;  Laterality: N/A;   CORONARY/GRAFT ACUTE MI REVASCULARIZATION N/A 08/27/2017   Procedure: Coronary/Graft Acute MI Revascularization;  Surgeon: Darron Deatrice LABOR, MD;  Location: [facility];  Service: Cardiovascular;  Laterality: N/A;   CYSTOSCOPY W/ RETROGRADES Bilateral 12/25/2018   Procedure: CYSTOSCOPY WITH RETROGRADE PYELOGRAM;  Surgeon: Penne Knee, MD;  Location: [facility] ORS;  Service: Urology;  Laterality: Bilateral;   FRACTURE SURGERY Right 1958   arm and left wrist compound fracture,  no metal   HERNIA REPAIR Right    inguinial   INGUINAL HERNIA REPAIR Left 12/05/2023   Procedure: REPAIR, HERNIA, INGUINAL, ADULT;  Surgeon: Marinda Jayson KIDD, MD;  Location: [facility];  Service: General;  Laterality: Left;  open procedure with mesh   INSERTION OF MESH  12/05/2023   Procedure: INSERTION OF MESH;  Surgeon: Marinda Jayson KIDD, MD;  Location: [facility] OR[facility];  Service: General;;   IR IMAGING GUIDED PORT INSERTION  10/15/2022   LEFT HEART CATH AND CORONARY ANGIOGRAPHY N/A 08/27/2017   Procedure: LEFT HEART CATH AND CORONARY ANGIOGRAPHY;  Surgeon: Darron Deatrice LABOR, MD;  Location: [facility];  Service: Cardiovascular;  Laterality: N/A;   PULMONARY THROMBECTOMY Bilateral 04/18/2023   Procedure: PULMONARY THROMBECTOMY;  Surgeon: Marea Selinda RAMAN, MD;  Location: [facility];  Service: Cardiovascular;  Laterality: Bilateral;   RIGHT/LEFT HEART CATH AND CORONARY/GRAFT ANGIOGRAPHY N/A 04/05/2023   Procedure: RIGHT/LEFT HEART CATH AND CORONARY/GRAFT  ANGIOGRAPHY;  Surgeon: Mady Bruckner, MD;  Location: [facility];  Service: Cardiovascular;  Laterality: N/A;   TEMPORARY PACEMAKER N/A 04/05/2023   Procedure: TEMPORARY PACEMAKER;  Surgeon: Mady Bruckner, MD;  Location: [facility];  Service: Cardiovascular;  Laterality: N/A;   TEMPORARY PACEMAKER N/A 12/10/2023   Procedure: TEMPORARY PACEMAKER;  Surgeon: Florencio Cara BIRCH, MD;  Location: [facility];  Service: Cardiovascular;  Laterality: N/A;   TRANSURETHRAL RESECTION OF BLADDER TUMOR WITH MITOMYCIN -C N/A 12/25/2018   Procedure: TRANSURETHRAL RESECTION OF BLADDER TUMOR WITH Gemcitabine ;  Surgeon: Penne Knee, MD;  Location: [facility] OR[facility];  Service: Urology;  Laterality: N/A;       Home Medications    Prior to Admission medications   Medication Sig Start Date End Date Taking? Authorizing Provider  acetaminophen  (TYLENOL ) 500 MG tablet Take 1,000 mg by mouth in the morning and at bedtime.    [provider]  apixaban  (ELIQUIS ) 5 MG TABS tablet Take 1 tablet (5 mg total) by mouth 2 (two) times daily. 12/07/23   Marinda Jayson KIDD, MD  aspirin  81 MG chewable tablet Place 1 tablet (81 mg total) into feeding tube daily. 12/07/23   Marinda Jayson KIDD, MD  atorvastatin  (LIPITOR ) 80 MG tablet Place 1 tablet (80 mg total) into feeding tube daily. Patient taking differently: Take 80 mg by mouth daily. 09/13/23   Abigail Bernardino HERO, PA-C  Blood Pressure Monitoring (BLOOD PRESSURE KIT) KIT 1 each by Does not apply route in the morning, at noon, and at bedtime. 07/25/23   Towana Small, FNP  famotidine  (PEPCID ) 20 MG tablet Place 1 tablet (20 mg total) into feeding tube daily. Patient taking differently: Take 20 mg by mouth daily. 09/28/23   Vivienne Bruckner Ingle, NP  levothyroxine  (SYNTHROID ) 100 MCG tablet Take 1 tablet (100 mcg total) by mouth daily before breakfast. 08/23/23   Brahmanday, Govinda R, MD  losartan  (COZAAR ) 25 MG tablet Take 1 tablet (25 mg total) by  mouth daily. Patient not taking: Reported on 12/29/2023 08/11/23 12/27/23  Vivienne Bruckner Ingle, NP  Multiple Vitamin (MULTIVITAMIN WITH MINERALS) TABS tablet Take 1 tablet by mouth daily.    [provider]  oxyCODONE  (OXY IR/ROXICODONE ) 5 MG immediate release tablet Take 1 tablet (5 mg total) by mouth every 6 (six) hours as needed for severe pain (pain score 7-10). 12/05/23   Marinda Jayson KIDD, MD  oxyCODONE  (ROXICODONE ) 5 MG immediate release tablet Take 1 tablet (5 mg total) by mouth every 8 (eight) hours as needed. 12/08/23  12/07/24  Marinda Jayson KIDD, MD    Family History Family History  Problem Relation Age of Onset   Heart failure Mother    Lung disease Father    Stroke Father    Cervical cancer Maternal Grandmother     Social History Social History   Tobacco Use   Smoking status: Former    Current packs/day: 0.00    Types: Cigarettes    Quit date: 2000    Years since quitting: 25.8    Passive exposure: Past   Smokeless tobacco: Former    Types: Snuff  Vaping Use   Vaping status: Never Used  Substance Use Topics   Alcohol use: Not Currently   Drug use: Not Currently     Allergies   Patient has no known allergies.   Review of Systems Review of Systems  Respiratory:  Negative for shortness of breath.   Cardiovascular:  Negative for chest pain and palpitations.  Neurological:  Positive for dizziness. Negative for syncope and headaches.     Physical Exam Triage Vital Signs ED Triage Vitals  Encounter Vitals Group     BP      Girls Systolic BP Percentile      Girls Diastolic BP Percentile      Boys Systolic BP Percentile      Boys Diastolic BP Percentile      Pulse      Resp      Temp      Temp src      SpO2      Weight      Height      Head Circumference      Peak Flow      Pain Score      Pain Loc      Pain Education      Exclude from Growth Chart    No data found.  Updated Vital Signs BP (!) 159/105 (BP Location: Left Arm)    Pulse (!) 107   Temp 98.1 F (36.7 C) (Oral)   Resp 16   Ht 6' (1.829 m)   Wt 210 lb 5.1 oz (95.4 kg)   SpO2 100%   BMI 28.52 kg/m   Visual Acuity Right Eye Distance:   Left Eye Distance:   Bilateral Distance:    Right Eye Near:   Left Eye Near:    Bilateral Near:     Physical Exam Vitals and nursing note reviewed.  Constitutional:      Appearance: Normal appearance.  HENT:     Head: Normocephalic and atraumatic.  Cardiovascular:     Rate and Rhythm: Regular rhythm. Tachycardia present.     Pulses: Normal pulses.     Heart sounds: Normal heart sounds. No murmur heard.    No friction rub. No gallop.  Pulmonary:     Effort: Pulmonary effort is normal.     Breath sounds: Normal breath sounds. No wheezing, rhonchi or rales.  Skin:    General: Skin is warm and dry.     Capillary Refill: Capillary refill takes less than 2 seconds.     Findings: No rash.  Neurological:     General: No focal deficit present.     Mental Status: He is alert and oriented to person, place, and time.      UC Treatments / Results  Labs (all labs ordered are listed, but only abnormal results are displayed) Labs Reviewed - No data to display  EKG White QRS rhythm with premature ventricular complexes  Ventricular rate 114 bpm PR interval undocumented QRS duration 150 ms QT/QTc 400/551 ms Left axis deviation with right bundle branch block.  Radiology No results found.  Procedures Procedures (including critical care time)  Medications Ordered in UC Medications - No data to display  Initial Impression / Assessment and Plan / UC Course  I have reviewed the triage vital signs and the nursing notes.  Pertinent labs & imaging results that were available during my care of the patient were reviewed by me and considered in my medical decision making (see chart for details).   Patient is a nontoxic-appearing 78 year old male with a significant cardiovascular history presenting for evaluation  of dizziness and presyncope that started approximately 1 hour prior to arrival when he was at [facility].  His symptoms have largely resolved though he still has a fast heart rate.  He is currently resting between 107 and 114 beats per minute.  Patient's EKG shows wide-complex rhythm with premature supraventricular beats.  Given his significant cardiovascular history I do feel that he needs to be evaluated in the emergency department.  He is currently hypertensive with a blood pressure of 159/105 and a heart rate of 107.  We will call EMS and have been transferred to [facility].  Report given to Sl[place], the charge nurse in the ER at [facility].  Report given to the paramedic with [facility].  Care transferred.   Final Clinical Impressions(s) / UC Diagnoses   Final diagnoses:  Near syncope     Discharge Instructions      Given your significant cardiac and vascular history I do feel that you need to be evaluated in the emergency department.  That is why we are transferring you to [facility] by EMS.     ED Prescriptions   None    PDMP not reviewed this encounter.   Bernardino Ditch, NP 12/31/23 1334    Bernardino Ditch, NP 12/31/23 1348

## 2023-12-31 NOTE — H&P (Signed)
 History and Physical    Patient: Cory Hess FMW:[id number] DOB: [birth year] DOA: 12/31/2023 DOS: the patient was seen and examined on 12/31/2023 PCP: Towana Small, FNP  Patient coming from: Home  Chief Complaint: Near syncope Chief Complaint  Patient presents with   Near Syncope   HPI: Cory Hess is a 78 y.o. male with medical history significant of coronary artery disease status post CABG, essential hypertension, hypothyroidism, BPH, carotid stenosis status post right ICA stent placement, history of cancer of the tongue, CKD stage [name] AA, GERD, heart failure with reduced ejection fraction, history of complete heart block, history of CVA who was brought in from today on account of near syncopal episode while at [facility].  Patient was recently admitted here on 12/09/2023 with similar presentation and discharged home.  Denies chest pain nausea vomiting abdominal pain or urinary complaints  ED course: Temperature 98.1, respiratory rate 16, heart rate 107, BP 159/105 saturating 100% on room air. ED physician discussed with cardiologist who recommended patient be admitted and observed overnight for concerns of tachybradycardia syndrome  Review of Systems: As mentioned in the history of present illness. All other systems reviewed and are negative. Past Medical History:  Diagnosis Date   Abscess of right arm 03/2023   pt states had absess to right arm after iv was inserted. Pt states he had a wound vac for 2 weeks   Alternating RBBB & LBBB    Ankle swelling 2024   Anticoagulated on apixaban     Aortic atherosclerosis    Arthritis of neck    Bilateral inguinal hernia    Bladder tumor    BPH (benign prostatic hyperplasia)    CAD (coronary artery disease)    a. 08/2017 STEMI/Cath: LM 80ost/d, LAD 100p, LCX 95 ost/p, RCA 60p, 33m, RPDA 80ost; b. 08/2017 CABG x 3: LIMA->LAD, VG->OM, VG->PDA; c. 03/2023 Cardiac Arrest/Cath: Occluded VG->RPDA, patent LIMA->LAD and VG->pLCX. No new native  dzs->Med rx. EF 25-35%.   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   CVA (cerebral vascular accident) (HCC) 04/08/2023   a.) brain MRI 04/08/2023: 2 small acute infarctions affecting the cortical brain in the RIGHT frontal region.   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 08/2017 TEE: EF 30-35%, sept/ant/inf HK, apical AK. 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 12/2017 Echo: EF 45-50%, diff HK; c. 09/2022 Echo: EF 55-60%; d. 03/2023 Echo (in settng of PE): EF 40-45%, GrI DD (imaging poor); e. 03/2023 Echo w/ definity : EF 55-60%, nl RV fxn.   Hyperlipidemia LDL goal <70    Hypothyroidism    Ischemic cardiomyopathy    a. 08/2017 TEE: EF 30-35%; b. 12/2017 Echo: EF 45-50%; c. 09/2022 Echo: EF 55-60%.   Long-term use of aspirin  therapy    Nephrolithiasis    PEA (Pulseless electrical activity) (HCC) 04/05/2023   a.) witnessed  arrest at [facility] and CPR initiated immediately --> ROSC achieved --> EMS transferred to [facility] with multiple episodes of PEA arrest en route (further CPR required) --> ROSC achieved in ED with further muliple epsides of recurrent PEA arrest lasting secs-mins --> TCP applied and patient taken to cath lab for [facility] and TVP placement   Port-A-Cath in place    Pulmonary embolism (HCC) 04/15/2023   a.) CTA chest 04/15/2023: extensive clot burden with an occlusive clot in the RIGHT upper lobe anterior and posterior segmental  arteries, and a non occlusive thrombus in the RIGHT lower lobe main artery and superior and posterior basal segmental arteries --> required thrombectomy   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) LIMA-LAD, SVG-OM, SVG-PDA   Shingles 2018   Status post bilateral cataract extraction    STEMI (ST elevation myocardial infarction) (HCC) 08/27/2017   a.) LHC 08/27/2017: 80% o-dLM, 100% lPAD, 95% o-pLCx, 60% pRCA, 90% mRCA, 80% oRPDA-RPDA --> unable to cross lesions with wire --> IABP placed --> CVTS consulted and patient transferred to [facility]; b.) s/p 3v CABG 08/27/2017: LIMA-LAD, SVG-OM, SVG-RPDA   Thrombocytopenia    Vertigo    Past Surgical History:  Procedure Laterality Date   CARDIAC CATHETERIZATION     CAROTID PTA/STENT INTERVENTION Right 10/26/2023   Procedure: CAROTID PTA/STENT INTERVENTION;  Surgeon: Marea Selinda RAMAN, MD;  Location: [facility] INVASIVE CV LAB;  Service: Cardiovascular;  Laterality: Right;   CATARACT EXTRACTION W/PHACO Left 07/18/2018   Procedure: CATARACT EXTRACTION PHACO AND INTRAOCULAR LENS PLACEMENT (IOC)  LEFT;  Surgeon: Myrna Adine Anes, MD;  Location: [facility];  Service: Ophthalmology;  Laterality: Left;   CATARACT EXTRACTION W/PHACO Right 08/14/2018   Procedure: CATARACT EXTRACTION PHACO AND INTRAOCULAR LENS PLACEMENT (IOC)  RIGHT;  Surgeon: Myrna Adine Anes, MD;  Location: [facility];  Service: Ophthalmology;  Laterality: Right;   CORONARY ARTERY BYPASS GRAFT N/A 08/27/2017   Procedure: CORONARY ARTERY BYPASS GRAFTING (CABG) ON PUMP USING LEFT INTERNAL MAMMARY ARTERY AND LEFT GREATER SAPHENOUS VEIN VIA ENDOVEIN HARVEST;  Surgeon: Lucas Dorise POUR, MD;  Location: [facility] OR;  Service: Open Heart Surgery;  Laterality: N/A;   CORONARY/GRAFT ACUTE MI REVASCULARIZATION N/A 08/27/2017   Procedure: Coronary/Graft Acute MI Revascularization;  Surgeon: Darron Deatrice LABOR, MD;  Location: [facility] INVASIVE CV LAB;  Service: Cardiovascular;  Laterality: N/A;   CYSTOSCOPY W/  RETROGRADES Bilateral 12/25/2018   Procedure: CYSTOSCOPY WITH RETROGRADE PYELOGRAM;  Surgeon: Penne Knee, MD;  Location: [facility] ORS;  Service: Urology;  Laterality: Bilateral;   FRACTURE SURGERY Right 1958   arm and left wrist compound fracture, no metal   HERNIA REPAIR Right    inguinial   INGUINAL HERNIA REPAIR Left 12/05/2023   Procedure: REPAIR, HERNIA, INGUINAL, ADULT;  Surgeon: Marinda Jayson KIDD, MD;  Location: [facility] ORS;  Service: General;  Laterality: Left;  open procedure with mesh   INSERTION OF MESH  12/05/2023   Procedure: INSERTION OF MESH;  Surgeon: Marinda Jayson KIDD, MD;  Location: [facility] ORS;  Service: General;;   IR IMAGING GUIDED PORT INSERTION  10/15/2022   LEFT HEART CATH AND CORONARY ANGIOGRAPHY N/A 08/27/2017   Procedure: LEFT HEART CATH AND CORONARY ANGIOGRAPHY;  Surgeon: Darron Deatrice LABOR, MD;  Location: [facility] INV[facility] CV [facility];  Service: Cardiovascular;  Laterality: N/A;   PULMONARY THROMBECTOMY Bilateral 04/18/2023   Procedure: PULMONARY THROMBECTOMY;  Surgeon: Marea Selinda RAMAN, MD;  Location: [facility] INV[facility] CV [facility];  Service: Cardiovascular;  Laterality: Bilateral;   RIGHT/LEFT HEART CATH AND CORONARY/GRAFT  ANGIOGRAPHY N/A 04/05/2023   Procedure: RIGHT/LEFT HEART CATH AND CORONARY/GRAFT ANGIOGRAPHY;  Surgeon: Mady Bruckner, MD;  Location: [facility] INV[facility] CV [facility];  Service: Cardiovascular;  Laterality: N/A;   TEMPORARY PACEMAKER N/A 04/05/2023   Procedure: TEMPORARY PACEMAKER;  Surgeon: Mady Bruckner, MD;  Location: [facility] INV[facility] CV [facility];  Service: Cardiovascular;  Laterality: N/A;   TEMPORARY PACEMAKER N/A 12/10/2023   Procedure: TEMPORARY PACEMAKER;  Surgeon: Florencio Cara BIRCH, MD;  Location: [facility];  Service: Cardiovascular;  Laterality: N/A;   TRANSURETHRAL RESECTION OF BLADDER TUMOR WITH MITOMYCIN -C N/A 12/25/2018   Procedure: TRANSURETHRAL RESECTION OF BLADDER TUMOR WITH Gemcitabine ;  Surgeon: Penne Knee, MD;  Location: [facility] ORS;  Service: Urology;   Laterality: N/A;   Social History:  reports that he quit smoking about 25 years ago. His smoking use included cigarettes. He has been exposed to tobacco smoke. He has quit using smokeless tobacco.  His smokeless tobacco use included snuff. He reports that he does not currently use alcohol. He reports that he does not currently use drugs.  No Known Allergies  Family History  Problem Relation Age of Onset   Heart failure Mother    Lung disease Father    Stroke Father    Cervical cancer Maternal Grandmother     Prior to Admission medications   Medication Sig Start Date End Date Taking? Authorizing Provider  acetaminophen  (TYLENOL ) 500 MG tablet Take 1,000 mg by mouth in the morning and at bedtime.   Yes [provider]  apixaban  (ELIQUIS ) 5 MG TABS tablet Take 1 tablet (5 mg total) by mouth 2 (two) times daily. 12/07/23  Yes Marinda Jayson KIDD, MD  aspirin  81 MG chewable tablet Place 1 tablet (81 mg total) into feeding tube daily. 12/07/23  Yes Marinda Jayson KIDD, MD  atorvastatin  (LIPITOR ) 80 MG tablet Place 1 tablet (80 mg total) into feeding tube daily. Patient taking differently: Take 80 mg by mouth daily. 09/13/23  Yes Dunn, Bernardino HERO, PA-C  famotidine  (PEPCID ) 20 MG tablet Place 1 tablet (20 mg total) into feeding tube daily. Patient taking differently: Take 20 mg by mouth daily. 09/28/23  Yes Vivienne Bruckner Ingle, NP  levothyroxine  (SYNTHROID ) 100 MCG tablet Take 1 tablet (100 mcg total) by mouth daily before breakfast. 08/23/23  Yes Brahmanday, Govinda R, MD  Multiple Vitamin (MULTIVITAMIN WITH MINERALS) TABS tablet Take 1 tablet by mouth daily.   Yes [provider]  oxyCODONE  (ROXICODONE ) 5 MG immediate release tablet Take 1 tablet (5 mg total) by mouth every 8 (eight) hours as needed. 12/08/23 12/07/24 Yes Marinda Jayson KIDD, MD  Blood Pressure Monitoring (BLOOD PRESSURE KIT) KIT 1 each by Does not apply route in the morning, at noon, and at bedtime. 07/25/23   Towana Small, FNP  losartan  (COZAAR ) 25 MG tablet Take 1 tablet (25 mg total) by mouth daily. Patient not taking: No sig reported 08/11/23 12/27/23  Vivienne Bruckner Ingle, NP  oxyCODONE  (OXY IR/ROXICODONE ) 5 MG immediate release tablet Take 1 tablet (5 mg total) by mouth every 6 (six) hours as needed for severe pain (pain score 7-10). 12/05/23   Marinda Jayson KIDD, MD    Physical Exam: Vitals:   12/31/23 1423 12/31/23 1432 12/31/23 1448  BP:  (!) 150/88   Pulse:  (!) 114   Resp:  16   Temp:   99.2 F (37.3 C)  TempSrc:   Oral  SpO2:  98%   Weight: 95.3 kg    Height: 6' (1.829 m)  General exam: Elderly male laying in bed in no acute distress Respiratory system: Clear to auscultation bilaterally Cardiovascular system: S1-S2, RRR, no murmurs, no pedal edema Gastrointestinal system: No abnormality detected Central nervous system: Alert and oriented. No focal neurological deficits. Extremities: Symmetric 5 x 5 power. Skin: No rashes, lesions or ulcers Psychiatry: Judgement and insight appear normal. Mood & affect appropriate.    Data Reviewed: Chest x-ray reviewed that did not show any acute cardiopulmonary pathology      Latest Ref Rng & Units 12/31/2023    2:40 PM 12/13/2023    8:02 AM 12/11/2023    4:32 AM  CBC  WBC 4.0 - 10.5 K/uL 7.1  6.8  6.2   Hemoglobin 13.0 - 17.0 g/dL 87.7  88.8  89.9   Hematocrit 39.0 - 52.0 % 38.1  33.4  30.5   Platelets 150 - 400 K/uL 129  153  147        Latest Ref Rng & Units 12/31/2023    2:40 PM 12/13/2023    8:02 AM 12/11/2023    4:32 AM  BMP  Glucose 70 - 99 mg/dL 898  894  899   BUN 8 - 23 mg/dL 20  20  25    Creatinine 0.61 - 1.24 mg/dL 8.57  8.86  8.64   Sodium 135 - 145 mmol/L 141  137  139   Potassium 3.5 - 5.1 mmol/L 4.2  4.0  4.7   Chloride 98 - 111 mmol/L 103  104  105   CO2 22 - 32 mmol/L 27  26  23    Calcium  8.9 - 10.3 mg/dL 9.1  8.7  8.7      Assessment and Plan:  Near syncope likely secondary to tachybradycardia  syndrome Patient was recently admitted here last month with bradycardia Now presents with tachycardia Cardiologist informed by ED physician and consult placed Continue to monitor on telemetry Avoid AV nodal blocking agent Echocardiogram obtain in October 2025 showed EF 55 to 60% with grade 1 diastolic dysfunction   Coronary artery disease status post CABG Continue statin and aspirin  Avoiding beta-blocker at this time  Essential hypertension Monitor blood pressure closely  Hypothyroidism Continue levothyroxine   Carotid stenosis status post right ICA stent placement,  Continue aspirin  and statin therapy   AKI on CKD stage [name] A Renal function few weeks ago was 1.1 however presents with creatinine of 1.4 Continue to monitor closely We will give gentle IV fluid  GERD -Continue famotidine    History of PE-continue Eliquis    Advance Care Planning:   Code Status: Prior full code  Consults: Cardiology  Family Communication: None present at bedside  Severity of Illness: The appropriate patient status for this patient is OBSERVATION. Observation status is judged to be reasonable and necessary in order to provide the required intensity of service to ensure the patient's safety. The patient's presenting symptoms, physical exam findings, and initial radiographic and laboratory data in the context of their medical condition is felt to place them at decreased risk for further clinical deterioration. Furthermore, it is anticipated that the patient will be medically stable for discharge from the hospital within 2 midnights of admission.   Author: Drue ONEIDA Potter, MD 12/31/2023 4:10 PM  For on call review [email or web address].

## 2024-01-01 DIAGNOSIS — R55 Syncope and collapse: Secondary | ICD-10-CM | POA: Diagnosis not present

## 2024-01-01 DIAGNOSIS — I453 Trifascicular block: Secondary | ICD-10-CM | POA: Diagnosis not present

## 2024-01-01 DIAGNOSIS — I480 Paroxysmal atrial fibrillation: Secondary | ICD-10-CM | POA: Diagnosis not present

## 2024-01-01 DIAGNOSIS — I495 Sick sinus syndrome: Secondary | ICD-10-CM | POA: Diagnosis not present

## 2024-01-01 DIAGNOSIS — Z951 Presence of aortocoronary bypass graft: Secondary | ICD-10-CM | POA: Diagnosis not present

## 2024-01-01 DIAGNOSIS — E782 Mixed hyperlipidemia: Secondary | ICD-10-CM | POA: Diagnosis not present

## 2024-01-01 DIAGNOSIS — I4719 Other supraventricular tachycardia: Secondary | ICD-10-CM

## 2024-01-01 DIAGNOSIS — I2510 Atherosclerotic heart disease of native coronary artery without angina pectoris: Secondary | ICD-10-CM | POA: Diagnosis not present

## 2024-01-01 DIAGNOSIS — I442 Atrioventricular block, complete: Secondary | ICD-10-CM

## 2024-01-01 LAB — CBC
HCT: 39.3 % (ref 39.0–52.0)
Hemoglobin: 12.8 g/dL — ABNORMAL LOW (ref 13.0–17.0)
MCH: 31.1 pg (ref 26.0–34.0)
MCHC: 32.6 g/dL (ref 30.0–36.0)
MCV: 95.6 fL (ref 80.0–100.0)
Platelets: 153 K/uL (ref 150–400)
RBC: 4.11 MIL/uL — ABNORMAL LOW (ref 4.22–5.81)
RDW: 13.6 % (ref 11.5–15.5)
WBC: 6.6 K/uL (ref 4.0–10.5)
nRBC: 0 % (ref 0.0–0.2)

## 2024-01-01 LAB — BASIC METABOLIC PANEL WITH GFR
Anion gap: 9 (ref 5–15)
BUN: 19 mg/dL (ref 8–23)
CO2: 24 mmol/L (ref 22–32)
Calcium: 8.9 mg/dL (ref 8.9–10.3)
Chloride: 107 mmol/L (ref 98–111)
Creatinine, Ser: 1.12 mg/dL (ref 0.61–1.24)
GFR, Estimated: >60 mL/min (ref >60)
Glucose, Bld: 102 mg/dL — ABNORMAL HIGH (ref 70–99)
Potassium: 4.1 mmol/L (ref 3.5–5.1)
Sodium: 140 mmol/L (ref 135–145)

## 2024-01-01 MED ORDER — LOSARTAN POTASSIUM 25 MG PO TABS
25.0000 mg | ORAL_TABLET | Freq: Every day | ORAL | Status: DC
  Administered 2024-01-02: 25 mg via ORAL
  Filled 2024-01-01 (×2): qty 1

## 2024-01-01 MED ORDER — ORAL CARE MOUTH RINSE
15.0000 mL | OROMUCOSAL | Status: DC | PRN
Start: 2024-01-01 — End: 2024-01-02

## 2024-01-01 NOTE — Plan of Care (Signed)

## 2024-01-01 NOTE — Progress Notes (Signed)
 Progress Note  Patient Name: Cory Hess Date of Encounter: 01/01/2024 [facility] Cardiologist: Lonni Hanson, MD   Interval Summary    Patient reports dull lightheadedness and dizziness. Seems have intermittent ?Atach, but mostly SR with HR 60-70s  Vital Signs Vitals:   12/31/23 1800 12/31/23 1908 12/31/23 2043 01/01/24 0355  BP: (!) 163/90  (!) 145/83 (!) 143/79  Pulse: (!) 117  (!) 54 (!) 58  Resp: 16  18   Temp:  98.4 F (36.9 C) 98.6 F (37 C) 98.4 F (36.9 C)  TempSrc:  Oral Oral Oral  SpO2: 99%  100% 100%  Weight:      Height:        Intake/Output Summary (Last 24 hours) at 01/01/2024 0827 Last data filed at 01/01/2024 0400 Gross per 24 hour  Intake 1435 ml  Output 475 ml  Net 960 ml      12/31/2023    2:23 PM 12/31/2023   12:58 PM 12/29/2023    8:49 AM  Last 3 Weights  Weight (lbs) 210 lb 210 lb 5.1 oz 210 lb 6.4 oz  Weight (kg) 95.255 kg 95.4 kg 95.437 kg      Telemetry/ECG  Intermittent Atach with HR up to 130s, NSR with HR 78 yo 67s - Personally Reviewed  Physical Exam  GEN: No acute distress.   Neck: No JVD Cardiac: RRR, no murmurs, rubs, or gallops.  Respiratory: Clear to auscultation bilaterally. GI: Soft, nontender, non-distended  MS: No edema  CV studies: Echo 12/11/23  1. Left ventricular ejection fraction, by estimation, is 55 to 60%. The  left ventricle has normal function. The left ventricle has no regional  wall motion abnormalities. There is moderate concentric left ventricular  hypertrophy. Left ventricular  diastolic parameters are consistent with Grade I diastolic dysfunction  (impaired relaxation).   2. Right ventricular systolic function is normal. The right ventricular  size is normal.   3. Left atrial size was mildly dilated.   4. The mitral valve is normal in structure. Trivial mitral valve  regurgitation. No evidence of mitral stenosis.   5. The aortic valve is tricuspid. Aortic valve regurgitation is not   visualized. No aortic stenosis is present.   6. The inferior vena cava is normal in size with greater than 50%  respiratory variability, suggesting right atrial pressure of 3 mmHg.   Assessment & Plan   Near syncope Trifascicular block H/o CHB requiring TVP Atrial tachycardia Tachy-brady syndrome - patient previously required venous temp wire in 03/2023 in the setting of PEA arrest in the setting of PE - he was hospitalization 11/2023 and developed CHB 10/18  leading to near syncope, and underwent temp wire placement 10/18. Coreg  was held with improvement to NSR, discharged with heart monitor - presented with near syncope found to be tachycardic. No AV nodal blocker was given due to h/o CHB - tele shows improved HR 50 to 70s with intermittent ?Atach - avoid AV nodal blocking agents due to h/o CHB/bradycardia with BB use - plan to see EP to discuss PPM  CAD s/p CABG 3V in 2019 without angina - no chest pain reported - no ischemic work-up at this time - continue ASA statin therapy  HFrEF - LV reduced at time of CABG with subsequent improvement - Echo 10/19 showed LVEF 55-60%, G1DD, , trivial MR - BB held for bradycardia/HCB - Losartan  held for hypotension during most recent admission  Carotid artery disease - s/p right ICA stenting 10/2023 - per  vascular surgery  HTN - BP mildly elevated>we will continue to monitor  H/o PE - Eliquis  5mg  BID   For questions or updates, please contact [facility] Please consult [email or web address] for contact info under         Signed, Graycie Halley VEAR Fishman, PA-C

## 2024-01-01 NOTE — Progress Notes (Signed)
 Progress Note   Patient: Cory Hess FMW:[id number] DOB: May 05, 1945 DOA: 12/31/2023     0 DOS: the patient was seen and examined on 01/01/2024   Brief hospital course: Cory Hess is a 78 y.o. male with medical history significant of coronary artery disease status post CABG, essential hypertension, hypothyroidism, BPH, carotid stenosis status post right ICA stent placement, history of cancer of the tongue, CKD stage [name] AA, GERD, heart failure with reduced ejection fraction, history of complete heart block, history of CVA who was brought in from today on account of near syncopal episode while at [facility].  Patient was recently admitted here on 12/09/2023 with similar presentation and discharged home.  Denies chest pain nausea vomiting abdominal pain or urinary complaints   ED course: Temperature 98.1, respiratory rate 16, heart rate 107, BP 159/105 saturating 100% on room air. 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 Patient was recently admitted here last month with bradycardia Now presents with tachycardia Cardiologist informed by ED physician and consult placed Continue to monitor on telemetry Avoid AV nodal blocking agent Echocardiogram obtained in October 2025 showed EF 55 to 60% with grade 1 diastolic dysfunction Cardiologist planning EP eval hopefully by tomorrow   Coronary artery disease status post CABG Continue statin and aspirin  Avoiding beta-blocker at this time   Essential hypertension Losartan  resumed as patient's BP is uncontrolled   Hypothyroidism Continue levothyroxine    Carotid stenosis status post right ICA stent placement,  Continue aspirin  and statin therapy     AKI on CKD stage [name] A Renal function few weeks ago was 1.1 however presents with creatinine of 1.4 Continue to  monitor closely Continue give gentle IV fluid   GERD -Continue famotidine      History of PE-continue Eliquis     Advance Care Planning:   Code Status: Prior full code   Consults: Cardiology   Family Communication: None present at bedside     Subjective:  Patient seen and examined at bedside this morning Heart rate controlled Denies chest pain nausea vomiting abdominal pain  Physical Exam: General exam: Elderly male laying in bed in no acute distress Respiratory system: Clear to auscultation bilaterally Cardiovascular system: S1-S2, RRR, no murmurs, no pedal edema Gastrointestinal system: No abnormality detected Central nervous system: Alert and oriented. No focal neurological deficits. Extremities: Symmetric 5 x 5 power. Skin: No rashes, lesions or ulcers Psychiatry: Judgement and insight appear normal. Mood & affect appropriate.    Vitals:   12/31/23 2043 01/01/24 0355 01/01/24 0837 01/01/24 1244  BP: (!) 145/83 (!) 143/79 (!) 148/80 (!) 145/72  Pulse: (!) 54 (!) 58 84 70  Resp: 18  15   Temp: 98.6 F (37 C) 98.4 F (36.9 C) 98.1 F (36.7 C) 99 F (37.2 C)  TempSrc: Oral Oral Oral   SpO2: 100% 100% 99% 97%  Weight:      Height:        Data Reviewed: Telemetry reviewed as well as below mentioned labs    Latest Ref Rng & Units 01/01/2024    4:23 AM 12/31/2023    2:40 PM 12/13/2023    8:02 AM  CBC  WBC 4.0 - 10.5 K/uL 6.6  7.1  6.8   Hemoglobin 13.0 - 17.0 g/dL 87.1  87.7  88.8   Hematocrit 39.0 - 52.0 % 39.3  38.1  33.4  Platelets 150 - 400 K/uL 153  129  153        Latest Ref Rng & Units 01/01/2024    4:23 AM 12/31/2023    2:40 PM 12/13/2023    8:02 AM  BMP  Glucose 70 - 99 mg/dL 897  898  894   BUN 8 - 23 mg/dL 19  20  20    Creatinine 0.61 - 1.24 mg/dL 8.87  8.57  8.86   Sodium 135 - 145 mmol/L 140  141  137   Potassium 3.5 - 5.1 mmol/L 4.1  4.2  4.0   Chloride 98 - 111 mmol/L 107  103  104   CO2 22 - 32 mmol/L 24  27  26    Calcium  8.9 - 10.3  mg/dL 8.9  9.1  8.7      Author: Drue ONEIDA Potter, MD 01/01/2024 3:04 PM  For on call review [email or web address]

## 2024-01-02 ENCOUNTER — Other Ambulatory Visit: Payer: Self-pay

## 2024-01-02 ENCOUNTER — Telehealth (HOSPITAL_COMMUNITY): Payer: Self-pay | Admitting: Pharmacy Technician

## 2024-01-02 ENCOUNTER — Other Ambulatory Visit (HOSPITAL_COMMUNITY): Payer: Self-pay

## 2024-01-02 ENCOUNTER — Encounter (HOSPITAL_COMMUNITY): Payer: Self-pay | Admitting: Obstetrics and Gynecology

## 2024-01-02 ENCOUNTER — Inpatient Hospital Stay (HOSPITAL_COMMUNITY)
Admission: EM | Admit: 2024-01-02 | Discharge: 2024-01-04 | DRG: 243 | Disposition: A | Discharge status: Home Health | Source: Other Acute Inpatient Hospital | Attending: Internal Medicine | Admitting: Internal Medicine

## 2024-01-02 ENCOUNTER — Encounter (HOSPITAL_COMMUNITY): Payer: Self-pay

## 2024-01-02 DIAGNOSIS — Z950 Presence of cardiac pacemaker: Secondary | ICD-10-CM | POA: Diagnosis not present

## 2024-01-02 DIAGNOSIS — Z951 Presence of aortocoronary bypass graft: Secondary | ICD-10-CM

## 2024-01-02 DIAGNOSIS — Z87442 Personal history of urinary calculi: Secondary | ICD-10-CM

## 2024-01-02 DIAGNOSIS — I255 Ischemic cardiomyopathy: Secondary | ICD-10-CM | POA: Diagnosis present

## 2024-01-02 DIAGNOSIS — Z743 Need for continuous supervision: Secondary | ICD-10-CM | POA: Diagnosis not present

## 2024-01-02 DIAGNOSIS — I6529 Occlusion and stenosis of unspecified carotid artery: Secondary | ICD-10-CM | POA: Diagnosis present

## 2024-01-02 DIAGNOSIS — I2510 Atherosclerotic heart disease of native coronary artery without angina pectoris: Secondary | ICD-10-CM | POA: Diagnosis not present

## 2024-01-02 DIAGNOSIS — Z923 Personal history of irradiation: Secondary | ICD-10-CM | POA: Diagnosis not present

## 2024-01-02 DIAGNOSIS — N183 Chronic kidney disease, stage 3 unspecified: Secondary | ICD-10-CM | POA: Diagnosis present

## 2024-01-02 DIAGNOSIS — Z9842 Cataract extraction status, left eye: Secondary | ICD-10-CM

## 2024-01-02 DIAGNOSIS — G8929 Other chronic pain: Secondary | ICD-10-CM | POA: Diagnosis present

## 2024-01-02 DIAGNOSIS — Z823 Family history of stroke: Secondary | ICD-10-CM

## 2024-01-02 DIAGNOSIS — R001 Bradycardia, unspecified: Secondary | ICD-10-CM

## 2024-01-02 DIAGNOSIS — E785 Hyperlipidemia, unspecified: Secondary | ICD-10-CM | POA: Diagnosis present

## 2024-01-02 DIAGNOSIS — E039 Hypothyroidism, unspecified: Secondary | ICD-10-CM | POA: Diagnosis not present

## 2024-01-02 DIAGNOSIS — I130 Hypertensive heart and chronic kidney disease with heart failure and stage 1 through stage 4 chronic kidney disease, or unspecified chronic kidney disease: Secondary | ICD-10-CM | POA: Diagnosis present

## 2024-01-02 DIAGNOSIS — I453 Trifascicular block: Secondary | ICD-10-CM | POA: Diagnosis present

## 2024-01-02 DIAGNOSIS — R55 Syncope and collapse: Secondary | ICD-10-CM | POA: Diagnosis present

## 2024-01-02 DIAGNOSIS — Z85810 Personal history of malignant neoplasm of tongue: Secondary | ICD-10-CM

## 2024-01-02 DIAGNOSIS — Z8673 Personal history of transient ischemic attack (TIA), and cerebral infarction without residual deficits: Secondary | ICD-10-CM | POA: Diagnosis not present

## 2024-01-02 DIAGNOSIS — I5042 Chronic combined systolic (congestive) and diastolic (congestive) heart failure: Secondary | ICD-10-CM | POA: Diagnosis present

## 2024-01-02 DIAGNOSIS — I452 Bifascicular block: Secondary | ICD-10-CM | POA: Diagnosis not present

## 2024-01-02 DIAGNOSIS — Z7901 Long term (current) use of anticoagulants: Secondary | ICD-10-CM

## 2024-01-02 DIAGNOSIS — I5032 Chronic diastolic (congestive) heart failure: Secondary | ICD-10-CM | POA: Diagnosis not present

## 2024-01-02 DIAGNOSIS — I10 Essential (primary) hypertension: Secondary | ICD-10-CM | POA: Diagnosis not present

## 2024-01-02 DIAGNOSIS — N400 Enlarged prostate without lower urinary tract symptoms: Secondary | ICD-10-CM | POA: Diagnosis present

## 2024-01-02 DIAGNOSIS — Z7982 Long term (current) use of aspirin: Secondary | ICD-10-CM

## 2024-01-02 DIAGNOSIS — I700 Atherosclerosis of aorta: Secondary | ICD-10-CM | POA: Diagnosis not present

## 2024-01-02 DIAGNOSIS — Z79891 Long term (current) use of opiate analgesic: Secondary | ICD-10-CM

## 2024-01-02 DIAGNOSIS — Z79890 Hormone replacement therapy (postmenopausal): Secondary | ICD-10-CM

## 2024-01-02 DIAGNOSIS — K760 Fatty (change of) liver, not elsewhere classified: Secondary | ICD-10-CM | POA: Diagnosis present

## 2024-01-02 DIAGNOSIS — I442 Atrioventricular block, complete: Secondary | ICD-10-CM | POA: Diagnosis present

## 2024-01-02 DIAGNOSIS — Z9221 Personal history of antineoplastic chemotherapy: Secondary | ICD-10-CM | POA: Diagnosis not present

## 2024-01-02 DIAGNOSIS — I6523 Occlusion and stenosis of bilateral carotid arteries: Secondary | ICD-10-CM

## 2024-01-02 DIAGNOSIS — K219 Gastro-esophageal reflux disease without esophagitis: Secondary | ICD-10-CM | POA: Diagnosis present

## 2024-01-02 DIAGNOSIS — Z86711 Personal history of pulmonary embolism: Secondary | ICD-10-CM | POA: Diagnosis not present

## 2024-01-02 DIAGNOSIS — E119 Type 2 diabetes mellitus without complications: Secondary | ICD-10-CM

## 2024-01-02 DIAGNOSIS — I455 Other specified heart block: Secondary | ICD-10-CM | POA: Diagnosis not present

## 2024-01-02 DIAGNOSIS — I495 Sick sinus syndrome: Secondary | ICD-10-CM | POA: Diagnosis not present

## 2024-01-02 DIAGNOSIS — Z8674 Personal history of sudden cardiac arrest: Secondary | ICD-10-CM

## 2024-01-02 DIAGNOSIS — Z87891 Personal history of nicotine dependence: Secondary | ICD-10-CM | POA: Diagnosis not present

## 2024-01-02 DIAGNOSIS — I4430 Unspecified atrioventricular block: Secondary | ICD-10-CM | POA: Diagnosis not present

## 2024-01-02 DIAGNOSIS — Z9841 Cataract extraction status, right eye: Secondary | ICD-10-CM

## 2024-01-02 DIAGNOSIS — Z79899 Other long term (current) drug therapy: Secondary | ICD-10-CM | POA: Diagnosis not present

## 2024-01-02 DIAGNOSIS — Z8249 Family history of ischemic heart disease and other diseases of the circulatory system: Secondary | ICD-10-CM | POA: Diagnosis not present

## 2024-01-02 DIAGNOSIS — I459 Conduction disorder, unspecified: Secondary | ICD-10-CM | POA: Diagnosis not present

## 2024-01-02 DIAGNOSIS — Z452 Encounter for adjustment and management of vascular access device: Secondary | ICD-10-CM | POA: Diagnosis not present

## 2024-01-02 DIAGNOSIS — E1122 Type 2 diabetes mellitus with diabetic chronic kidney disease: Secondary | ICD-10-CM | POA: Diagnosis present

## 2024-01-02 DIAGNOSIS — I252 Old myocardial infarction: Secondary | ICD-10-CM

## 2024-01-02 DIAGNOSIS — I110 Hypertensive heart disease with heart failure: Secondary | ICD-10-CM | POA: Diagnosis not present

## 2024-01-02 DIAGNOSIS — I4719 Other supraventricular tachycardia: Secondary | ICD-10-CM | POA: Diagnosis not present

## 2024-01-02 LAB — BASIC METABOLIC PANEL WITH GFR
Anion gap: 10 (ref 5–15)
BUN: 18 mg/dL (ref 8–23)
CO2: 26 mmol/L (ref 22–32)
Calcium: 8.8 mg/dL — ABNORMAL LOW (ref 8.9–10.3)
Chloride: 105 mmol/L (ref 98–111)
Creatinine, Ser: 1.28 mg/dL — ABNORMAL HIGH (ref 0.61–1.24)
GFR, Estimated: 58 mL/min — ABNORMAL LOW (ref >60)
Glucose, Bld: 83 mg/dL (ref 70–99)
Potassium: 4.6 mmol/L (ref 3.5–5.1)
Sodium: 141 mmol/L (ref 135–145)

## 2024-01-02 LAB — CBC WITH DIFFERENTIAL/PLATELET
Abs Immature Granulocytes: 0.02 K/uL (ref 0.00–0.07)
Basophils Absolute: 0.1 K/uL (ref 0.0–0.1)
Basophils Relative: 1 %
Eosinophils Absolute: 0.3 K/uL (ref 0.0–0.5)
Eosinophils Relative: 4 %
HCT: 37.3 % — ABNORMAL LOW (ref 39.0–52.0)
Hemoglobin: 12.1 g/dL — ABNORMAL LOW (ref 13.0–17.0)
Immature Granulocytes: 0 %
Lymphocytes Relative: 19 %
Lymphs Abs: 1.1 K/uL (ref 0.7–4.0)
MCH: 31.7 pg (ref 26.0–34.0)
MCHC: 32.4 g/dL (ref 30.0–36.0)
MCV: 97.6 fL (ref 80.0–100.0)
Monocytes Absolute: 0.6 K/uL (ref 0.1–1.0)
Monocytes Relative: 9 %
Neutro Abs: 3.9 K/uL (ref 1.7–7.7)
Neutrophils Relative %: 67 %
Platelets: 125 K/uL — ABNORMAL LOW (ref 150–400)
RBC: 3.82 MIL/uL — ABNORMAL LOW (ref 4.22–5.81)
RDW: 13.5 % (ref 11.5–15.5)
WBC: 5.9 K/uL (ref 4.0–10.5)
nRBC: 0 % (ref 0.0–0.2)

## 2024-01-02 LAB — SURGICAL PCR SCREEN
MRSA, PCR: NEGATIVE
Staphylococcus aureus: NEGATIVE

## 2024-01-02 MED ORDER — POLYETHYLENE GLYCOL 3350 17 G PO PACK: 17.0000 g | PACK | Freq: Every day | ORAL | Status: DC | PRN

## 2024-01-02 MED ORDER — ASPIRIN 81 MG PO CHEW
81.0000 mg | CHEWABLE_TABLET | Freq: Every day | ORAL | Status: DC
  Administered 2024-01-03 – 2024-01-04 (×2): 81 mg via ORAL
  Filled 2024-01-02 (×2): qty 1

## 2024-01-02 MED ORDER — SODIUM CHLORIDE 0.9% FLUSH
3.0000 mL | Freq: Two times a day (BID) | INTRAVENOUS | Status: DC
  Administered 2024-01-03 – 2024-01-04 (×2): 3 mL via INTRAVENOUS

## 2024-01-02 MED ORDER — LEVOTHYROXINE SODIUM 100 MCG PO TABS
100.0000 ug | ORAL_TABLET | Freq: Every day | ORAL | Status: DC
  Administered 2024-01-03 – 2024-01-04 (×2): 100 ug via ORAL
  Filled 2024-01-02 (×2): qty 1

## 2024-01-02 MED ORDER — FAMOTIDINE 20 MG PO TABS
20.0000 mg | ORAL_TABLET | Freq: Every day | ORAL | Status: DC
  Administered 2024-01-03 – 2024-01-04 (×2): 20 mg via ORAL
  Filled 2024-01-02 (×2): qty 1

## 2024-01-02 MED ORDER — ACETAMINOPHEN 325 MG PO TABS: 650.0000 mg | ORAL_TABLET | Freq: Four times a day (QID) | ORAL | Status: DC | PRN

## 2024-01-02 MED ORDER — ACETAMINOPHEN 650 MG RE SUPP: 650.0000 mg | Freq: Four times a day (QID) | RECTAL | Status: DC | PRN

## 2024-01-02 MED ORDER — ATORVASTATIN CALCIUM 80 MG PO TABS
80.0000 mg | ORAL_TABLET | Freq: Every day | ORAL | Status: DC
  Administered 2024-01-03 – 2024-01-04 (×2): 80 mg via ORAL
  Filled 2024-01-02 (×2): qty 1

## 2024-01-02 MED ORDER — APIXABAN 5 MG PO TABS
5.0000 mg | ORAL_TABLET | Freq: Two times a day (BID) | ORAL | Status: DC
  Administered 2024-01-02: 5 mg via ORAL
  Filled 2024-01-02: qty 1

## 2024-01-02 MED ORDER — ASPIRIN 81 MG PO CHEW: 81.0000 mg | CHEWABLE_TABLET | Freq: Every day | ORAL | Status: DC

## 2024-01-02 MED ORDER — OXYCODONE HCL 5 MG PO TABS
5.0000 mg | ORAL_TABLET | Freq: Three times a day (TID) | ORAL | Status: DC | PRN
Start: 2024-01-02 — End: 2024-01-03
  Administered 2024-01-02 – 2024-01-03 (×3): 5 mg via ORAL
  Filled 2024-01-02 (×3): qty 1

## 2024-01-02 NOTE — Plan of Care (Signed)

## 2024-01-02 NOTE — TOC Initial Note (Signed)
 Transition of Care [facility]) - Initial/Assessment Note    Patient Details  Name: Cory Hess MRN: [id number] Date of Birth: 17-Apr-1945  Transition of Care [facility]) CM/SW Contact:    Shasta DELENA Daring, RN Phone Number: 01/02/2024, 10:28 AM  Clinical Narrative:                 RNCM assessed patient. Confirmed PCP is Evalene Arts, FNP. Patient drives self to appointments. Uses [facility] on [address] for pharmacy needs. Patient lives alone and does not have any home health serices. DME in home includes: walker, wheelchair. Patient says his sister will pick hip up after discharge.  Expected Discharge Plan: Home/Self Care Barriers to Discharge: Continued Medical Work up   Patient Goals and CMS Choice            Expected Discharge Plan and Services In-house Referral: Clinical Social Work     Living arrangements for the past 2 months: Single Family Home                                      Prior Living Arrangements/Services Living arrangements for the past 2 months: Single Family Home Lives with:: Self Patient language and need for interpreter reviewed:: Yes        Need for Family Participation in Patient Care: Yes (Comment) Care giver support system in place?: Yes (comment)      Activities of Daily Living   ADL Screening (condition at time of admission) Independently performs ADLs?: Yes (appropriate for developmental age) Is the patient deaf or have difficulty hearing?: No Does the patient have difficulty seeing, even when wearing glasses/contacts?: No Does the patient have difficulty concentrating, remembering, or making decisions?: No  Permission Sought/Granted                  Emotional Assessment Appearance:: Appears stated age Attitude/Demeanor/Rapport: Engaged, Gracious Affect (typically observed): Calm, Pleasant Orientation: : Oriented to Situation, Oriented to  Time, Oriented to Place, Oriented to Self Alcohol / Substance Use: Not  Applicable Psych Involvement: No (comment)  Admission diagnosis:  Syncope [R55] Near syncope [R55] Atrial fibrillation with RVR (HCC) [I48.91] Patient Active Problem List   Diagnosis Date Noted   Syncope 12/31/2023   Cardiogenic shock (HCC) 12/10/2023   Acute blood loss anemia 12/09/2023   Near syncope 12/09/2023   Carotid stenosis 10/18/2023   Open arm wound, right, sequela 08/19/2023   Abscess of right arm 08/02/2023   Left inguinal hernia 08/02/2023   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   Acquired hypothyroidism 08/02/2023   Essential hypertension    Blood glucose elevated 07/06/2023   Does not have primary care provider 07/01/2023   Thrush 07/01/2023   Former smoker 07/01/2023   Pulmonary embolus (HCC) 04/15/2023   Acute respiratory failure with hypoxia and hypercapnia (HCC) 04/10/2023   Acute CVA (cerebrovascular accident) (HCC) 04/09/2023   Hyperkalemia 04/06/2023   Acute kidney injury 04/06/2023   Cardiac arrest (HCC) 04/05/2023   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 [place] [address] Phone: [phone] Fax: [phone]     Social Drivers of Health (SDOH) Social History: SDOH Screenings   Food Insecurity: No Food Insecurity (01/01/2024)  Housing: Low Risk  (01/01/2024)  Transportation Needs: No Transportation Needs (01/01/2024)  Utilities: Not At Risk (01/01/2024)  Depression (PHQ2-9): Low Risk  (09/13/2023)  Financial Resource Strain: Low Risk  (11/06/2018)  Physical Activity: Inactive (11/06/2018)  Social Connections: Moderately Integrated (01/01/2024)  Stress: No Stress  Concern Present (11/06/2018)  Tobacco Use: Medium Risk (12/31/2023)   SDOH Interventions:     Readmission Risk Interventions    01/02/2024   10:24 AM 12/12/2023   11:24 AM  Readmission Risk Prevention Plan  Transportation Screening Complete Complete  PCP or Specialist Appt within 3-5 Days Complete Complete  Social Work Consult for Recovery Care Planning/Counseling Complete Complete  Palliative Care Screening  Not Applicable  Medication Review Oceanographer) Complete Complete

## 2024-01-02 NOTE — Progress Notes (Signed)
 Attempted to call report to [phone].  Nurse passing meds and will call back

## 2024-01-02 NOTE — Discharge Summary (Signed)
 Cory Hess:[id number] DOB: 1945/03/03 DOA: 12/31/2023  PCP: Towana Small, FNP  Admit date: 12/31/2023 Discharge date: 01/02/2024  Time spent: 35 minutes    Discharge Diagnoses:  Principal Problem:   Tachy-brady syndrome [facility]) Active Problems:   Syncope   Discharge Condition: stable  Diet recommendation: heart healthy  Filed Weights   12/31/23 1423  Weight: 95.3 kg    History of present illness:  From admission h and p Cory Hess is a 78 y.o. male with medical history significant of coronary artery disease status post CABG, essential hypertension, hypothyroidism, BPH, carotid stenosis status post right ICA stent placement, history of cancer of the tongue, CKD stage [name] AA, GERD, heart failure with reduced ejection fraction, history of complete heart block, history of CVA who was brought in from today on account of near syncopal episode while at [facility].  Patient was recently admitted here on 12/09/2023 with similar presentation and discharged home.  Denies chest pain nausea vomiting abdominal pain or urinary complaints     Hospital Course:  Patient presents with an episode of near syncope. Admitted last month with similar presentation, found to have high degree heart block then requiring temporary transvenous pacing. Heart block thought to be 2/2 beta blocker use so these were held and patient was discharged with ziopatch. He returns with similar symptoms, seen by EP, diagnosed with tachy-brady syndrome in addition to this intermittent high degree heart block. This in the setting of CAD s/p cabg in 2019, PE with PEA arrest earlier this year, CAS with stent placed in September of this year, and left inguinal hernia repair last month complicated by hematoma at the site. Also with a history of neck radiation. Seen by EP here and placement of PPM advised, given patient's prior neck radiation, inability to lie fully flat, thought safest approach to be transfer to [facility]   for this procedure to be done there. EP advises continuing home apixaban . Tentative plan for this procedure on 11/11, will need to notify EP upon arrival to [place].   Procedures: none   Consultations: EP  Discharge Exam: Vitals:   01/02/24 0700 01/02/24 0935  BP: (!) 163/81 139/81  Pulse: 86 79  Resp: 18   Temp: 97.6 F (36.4 C) 98 F (36.7 C)  SpO2: 99% 96%    General: NAD Cardiovascular: RRR Respiratory: CTAB  Discharge Instructions   Discharge Instructions     Diet - low sodium heart healthy   Complete by: As directed    Increase activity slowly   Complete by: As directed       Allergies as of 01/02/2024   No Known Allergies      Medication List     TAKE these medications    acetaminophen  500 MG tablet Commonly known as: TYLENOL  Take 1,000 mg by mouth in the morning and at bedtime.   apixaban  5 MG Tabs tablet Commonly known as: ELIQUIS  Take 1 tablet (5 mg total) by mouth 2 (two) times daily.   aspirin  81 MG chewable tablet Place 1 tablet (81 mg total) into feeding tube daily.   atorvastatin  80 MG tablet Commonly known as: LIPITOR  Place 1 tablet (80 mg total) into feeding tube daily. What changed: how to take this   Blood Pressure Kit Kit 1 each by Does not apply route in the morning, at noon, and at bedtime.   famotidine  20 MG tablet Commonly known as: PEPCID  Place 1 tablet (20 mg total) into feeding tube daily. What changed:  how to take this   levothyroxine  100 MCG tablet Commonly known as: SYNTHROID  Take 1 tablet (100 mcg total) by mouth daily before breakfast.   losartan  25 MG tablet Commonly known as: COZAAR  Take 1 tablet (25 mg total) by mouth daily.   multivitamin with minerals Tabs tablet Take 1 tablet by mouth daily.   oxyCODONE  5 MG immediate release tablet Commonly known as: Roxicodone  Take 1 tablet (5 mg total) by mouth every 8 (eight) hours as needed. What changed: Another medication with the same  name was removed. Continue taking this medication, and follow the directions you see here.       No Known Allergies    The results of significant diagnostics from this hospitalization (including imaging, microbiology, ancillary and laboratory) are listed below for reference.    Significant Diagnostic Studies: DG Chest Portable 1 View Result Date: 12/31/2023 CLINICAL DATA:  Palpitations, syncopal episode. EXAM: PORTABLE CHEST 1 VIEW COMPARISON:  07/17/2023, 12/09/2023. FINDINGS: The heart size and mediastinal contours are within normal limits. Atherosclerotic calcification of the aorta is noted. No consolidation, effusion, or pneumothorax is seen. A right chest port is stable in position. Sternotomy wires are present over the midline. No acute osseous abnormality. IMPRESSION: No active disease. Electronically Signed   By: Leita Birmingham M.D.   On: 12/31/2023 15:14   ECHOCARDIOGRAM COMPLETE Result Date: 12/11/2023    ECHOCARDIOGRAM REPORT   Patient Name:   Cory Hess Date of Exam: 12/11/2023 Medical Rec #:  [id number]       Height:       72.0 in Accession #:    [id number]      Weight:       212.5 lb Date of Birth:  [birth year]       BSA:          2.186 m Patient Age:    78 years        BP:           115/56 mmHg Patient Gender: M               HR:           71 bpm. Exam Location:  [facility] Procedure: 2D Echo, Cardiac Doppler and Color Doppler (Both Spectral and Color            Flow Doppler were utilized during procedure). Indications:     Heart block, Complete I44.2  History:         Patient has prior history of Echocardiogram examinations. CAD.  Sonographer:     Bari Roar Referring Phys:  [id number] RYAN M DUNN Diagnosing Phys: Annabella Scarce MD IMPRESSIONS  1. Left ventricular ejection fraction, by estimation, is 55 to 60%. The left ventricle has normal function. The left ventricle has no regional wall motion abnormalities. There is moderate concentric left ventricular hypertrophy. Left ventricular  diastolic parameters are consistent with Grade I diastolic dysfunction (impaired relaxation).  2. Right ventricular systolic function is normal. The right ventricular size is normal.  3. Left atrial size was mildly dilated.  4. The mitral valve is normal in structure. Trivial mitral valve regurgitation. No evidence of mitral stenosis.  5. The aortic valve is tricuspid. Aortic valve regurgitation is not visualized. No aortic stenosis is present.  6. The inferior vena cava is normal in size with greater than 50% respiratory variability, suggesting right atrial pressure of 3 mmHg. FINDINGS  Left Ventricle: Left ventricular ejection fraction, by estimation, is 55 to 60%. The left ventricle has  normal function. The left ventricle has no regional wall motion abnormalities. The left ventricular internal cavity size was normal in size. There is  moderate concentric left ventricular hypertrophy. Left ventricular diastolic parameters are consistent with Grade I diastolic dysfunction (impaired relaxation). Indeterminate filling pressures. Right Ventricle: The right ventricular size is normal. No increase in right ventricular wall thickness. Right ventricular systolic function is normal. Left Atrium: Left atrial size was mildly dilated. Right Atrium: Right atrial size was normal in size. Pericardium: There is no evidence of pericardial effusion. Mitral Valve: The mitral valve is normal in structure. Trivial mitral valve regurgitation. No evidence of mitral valve stenosis. MV peak gradient, 5.7 mmHg. The mean mitral valve gradient is 2.0 mmHg. Tricuspid Valve: The tricuspid valve is normal in structure. Tricuspid valve regurgitation is trivial. No evidence of tricuspid stenosis. Aortic Valve: The aortic valve is tricuspid. Aortic valve regurgitation is not visualized. No aortic stenosis is present. Aortic valve mean gradient measures 2.0 mmHg. Aortic valve peak gradient measures 4.4 mmHg. Aortic valve area, by VTI measures 2.09  cm. Pulmonic Valve: The pulmonic valve was normal in structure. Pulmonic valve regurgitation is not visualized. No evidence of pulmonic stenosis. Aorta: The aortic root is normal in size and structure. Venous: The inferior vena cava is normal in size with greater than 50% respiratory variability, suggesting right atrial pressure of 3 mmHg. IAS/Shunts: No atrial level shunt detected by color flow Doppler.  LEFT VENTRICLE PLAX 2D LVIDd:         4.60 cm   Diastology LVIDs:         3.20 cm   LV e' medial:    7.62 cm/s LV PW:         1.20 cm   LV E/e' medial:  10.8 LV IVS:        1.40 cm   LV e' lateral:   11.40 cm/s LVOT diam:     1.90 cm   LV E/e' lateral: 7.2 LV SV:         41 LV SV Index:   19 LVOT Area:     2.84 cm  RIGHT VENTRICLE RV Basal diam:  3.60 cm RV Mid diam:    3.20 cm RV S prime:     10.40 cm/s TAPSE (M-mode): 2.1 cm LEFT ATRIUM             Index        RIGHT ATRIUM           Index LA diam:        4.30 cm 1.97 cm/m   RA Area:     20.20 cm LA Vol (A2C):   66.3 ml 30.33 ml/m  RA Volume:   56.70 ml  25.93 ml/m LA Vol (A4C):   67.0 ml 30.65 ml/m LA Biplane Vol: 69.3 ml 31.70 ml/m  AORTIC VALVE                    PULMONIC VALVE AV Area (Vmax):    2.09 cm     PV Vmax:        1.48 m/s AV Area (Vmean):   1.82 cm     PV Peak grad:   8.8 mmHg AV Area (VTI):     2.09 cm     RVOT Peak grad: 2 mmHg AV Vmax:           105.00 cm/s AV Vmean:          72.700 cm/s AV VTI:  0.194 m AV Peak Grad:      4.4 mmHg AV Mean Grad:      2.0 mmHg LVOT Vmax:         77.50 cm/s LVOT Vmean:        46.600 cm/s LVOT VTI:          0.143 m LVOT/AV VTI ratio: 0.74  AORTA Ao Root diam: 3.10 cm Ao Asc diam:  3.60 cm MITRAL VALVE                TRICUSPID VALVE MV Area (PHT): 4.44 cm     TR Peak grad:   21.7 mmHg MV Area VTI:   1.61 cm     TR Vmax:        233.00 cm/s MV Peak grad:  5.7 mmHg MV Mean grad:  2.0 mmHg     SHUNTS MV Vmax:       1.19 m/s     Systemic VTI:  0.14 m MV Vmean:      63.7 cm/s    Systemic Diam: 1.90  cm MV Decel Time: 171 msec MV E velocity: 82.00 cm/s MV A velocity: 120.00 cm/s MV E/A ratio:  0.68 MV A Prime:    17.3 cm/s Annabella Scarce MD Electronically signed by Annabella Scarce MD Signature Date/Time: 12/11/2023/10:51:20 AM    Final    CT ANGIO NECK W OR WO CONTRAST Result Date: 12/10/2023 EXAM: CTA Neck 12/10/2023 10:39:42 AM TECHNIQUE: CT of the neck was performed without and with the administration of 75 mL of iohexol  (OMNIPAQUE ) 350 MG/ML injection. Multiplanar 2D and/or 3D reformatted images are provided for review. Automated exposure control, iterative reconstruction, and/or weight based adjustment of the mA/kV was utilized to reduce the radiation dose to as low as reasonably achievable. Stenosis of the internal carotid arteries measured using NASCET criteria. COMPARISON: None available CLINICAL HISTORY: Syncope/presyncope, cerebrovascular cause suspected. FINDINGS: AORTIC ARCH AND ARCH VESSELS: Mild atherosclerosis of the aortic arch. No dissection or arterial injury. No significant stenosis of the brachiocephalic or subclavian arteries. CERVICAL CAROTID ARTERIES: The right carotid artery is patent from the origin to the skull base. Mixed atherosclerotic plaque at the carotid bifurcation and right carotid bulb results in approximately 55% stenosis of the right ICA origin. There is an additional focal prominent region of noncalcified atherosclerotic plaque in the proximal right cervical ICA with a similar degree of narrowing. The left carotid artery is patent from the origin to the skull base. Atherosclerosis at the left carotid bifurcation without hemodynamically significant stenosis. No dissection or arterial injury. CERVICAL VERTEBRAL ARTERIES: Vertebral arteries are patent from the origins to the vertebrobasilar confluence. Atherosclerosis at the right vertebral artery origin results in moderate stenosis. There is additional mild stenosis at the left vertebral artery origin. Tortuosity of the  left V1 segment. The intracranial vertebral arteries are patent. There is an atretic appearance of the post PICA segment of the left vertebral artery with significant distal tapering of the left V4 segment without evidence of occlusion. No dissection or arterial injury. LUNGS AND MEDIASTINUM: Right chest wall port-a-cath is partially visualized. Sequelae of median sternotomy. Lungs are unremarkable. SOFT TISSUES: Bilateral lens replacement. Mucosal thickening and mucous retention cyst in the left maxillary sinus. No acute abnormality. BONES: No acute abnormality. LIMITATIONS/ARTIFACTS AND INCIDENTAL INTRACRANIAL FINDINGS: Limited visualization of intracranial structures. There is a small remote infarct noted within the superior right cerebellum. IMPRESSION: 1. Mixed atherosclerotic plaque at the right carotid bifurcation and bulb resulting in approximately 55% stenosis of  the right ICA origin. 2. Moderate stenosis at the right vertebral artery origin. 3. Additional atherosclerosis as above. 4. 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 Electronically signed by: Donnice Mania MD 12/10/2023 10:55 AM EDT RP Workstation: [id number]   CT Angio Abd/Pel W and/or Wo Contrast Result Date: 12/09/2023 CLINICAL DATA:  History of left inguinal hernia repair with hematoma. Suspicion for active bleeding. EXAM: CTA ABDOMEN AND PELVIS WITHOUT AND WITH CONTRAST TECHNIQUE: Multidetector CT imaging of the abdomen and pelvis was performed using the standard protocol during bolus administration of intravenous contrast. Multiplanar reconstructed images and MIPs were obtained and reviewed to evaluate the vascular anatomy. RADIATION DOSE REDUCTION: This exam was performed according to the departmental dose-optimization program which includes automated exposure control, adjustment of the mA and/or kV according to patient size and/or use of iterative reconstruction technique. CONTRAST:  OMNIPAQUE  IOHEXOL  350 MG/ML SOLN  COMPARISON:  Chest CT scan earlier, same date. Prior abdominal CT scan and 07/17/2023 FINDINGS: VASCULAR Aorta: Stable moderate to advanced age related atherosclerotic calcifications but no aneurysm. Sizable ulcerated plaque or focal dissection noted involving the posterior aspect of the lower abdominal aorta on the left side measuring approximately 2.2 cm. Celiac: Atherosclerotic calcifications but no significant stenosis or dissection. SMA: Atherosclerotic calcifications but no significant stenosis. Renals: 2 renal arteries bilaterally. Two right and 3 left renal arteries no significant stenosis. Scattered atherosclerotic calcifications. IMA: Patent Inflow: Moderate atherosclerotic calcifications but no stenosis or dissection. Proximal Outflow: No significant findings. Scattered calcifications. Veins: No significant findings. Review of the MIP images confirms the above findings. NON-VASCULAR Lower chest: No acute pulmonary findings. Pulmonary scarring changes. Hepatobiliary: No hepatic lesions or intrahepatic biliary dilatation. Gallbladder is unremarkable. No common bile duct dilatation. Pancreas: Unremarkable. No pancreatic ductal dilatation or surrounding inflammatory changes. Spleen: Normal in size without focal abnormality. Adrenals/Urinary Tract: Adrenal glands and kidneys are unremarkable. There is contrast in the renal collecting systems from prior CT scan. Bilateral renal cysts, unchanged. The bladder is unremarkable. Stomach/Bowel: The stomach, duodenum, small bowel and colon are grossly normal without oral contrast. No inflammatory changes, mass lesions or obstructive findings. The appendix is normal. Lymphatic: No abdominal or pelvic lymphadenopathy. Reproductive: Enlarged prostate gland. The seminal vesicles are unremarkable. Other: There is a sizable left groin hematoma measuring approximately 7.8 x 2.7 cm. This is anterior to the inguinal canal. No extravasation of contrast material to suggest  active bleeding. Small tortuous vessels in the spermatic cords bilaterally. Musculoskeletal: No significant bony findings. IMPRESSION: 1. Sizable left groin hematoma but no extravasation of contrast material to suggest active bleeding. 2. Sizable ulcerated plaque or focal dissection involving the posterior aspect of the lower abdominal aorta on the left side measuring approximately 2.2 cm. 3. No acute abdominal/pelvic findings, mass lesions or adenopathy. 4. Enlarged prostate gland. 5. Aortic atherosclerosis. Aortic Atherosclerosis (ICD10-I70.0). Electronically Signed   By: MYRTIS Stammer M.D.   On: 12/09/2023 18:02   CT Angio Chest PE W and/or Wo Contrast Result Date: 12/09/2023 CLINICAL DATA:  Pulmonary embolism (PE) suspected, high prob. Near syncopal episode. History of tongue carcinoma. * Tracking Code: BO * EXAM: CT ANGIOGRAPHY CHEST WITH CONTRAST TECHNIQUE: Multidetector CT imaging of the chest was performed using the standard protocol during bolus administration of intravenous contrast. Multiplanar CT image reconstructions and MIPs were obtained to evaluate the vascular anatomy. RADIATION DOSE REDUCTION: This exam was performed according to the departmental dose-optimization program which includes automated exposure control, adjustment of the mA and/or kV according to patient size  and/or use of iterative reconstruction technique. CONTRAST:  75mL OMNIPAQUE  IOHEXOL  350 MG/ML SOLN COMPARISON:  CT angiography chest from 07/17/2023. FINDINGS: Cardiovascular: No evidence of embolism to the proximal subsegmental pulmonary artery level. Mild cardiomegaly. No pericardial effusion. No aortic aneurysm. There are coronary artery calcifications, in keeping with coronary artery disease. There are also mild peripheral atherosclerotic vascular calcifications of thoracic aorta and its major branches. Mediastinum/Nodes: Visualized thyroid  gland appears grossly unremarkable. No solid / cystic mediastinal masses. The  esophagus is nondistended precluding optimal assessment. No axillary, mediastinal or hilar lymphadenopathy by size criteria. Lungs/Pleura: The central tracheo-bronchial tree is patent. There is mild, smooth, circumferential thickening of the segmental and subsegmental bronchial walls, throughout bilateral lungs, which is nonspecific. Findings are most commonly seen with bronchitis or reactive airway disease, such as asthma. There are dependent changes in bilateral lungs. No mass or consolidation. No pleural effusion or pneumothorax. There is a new 5 x 6 mm ground-glass nodule in the right upper lobe (series 5, image 62). Upper Abdomen: Visualized upper abdominal viscera within normal limits. Musculoskeletal: The visualized soft tissues of the chest wall are grossly unremarkable. No suspicious osseous lesions. There are mild to moderate multilevel degenerative changes in the visualized spine. Review of the MIP images confirms the above findings. IMPRESSION: 1. No embolism to the proximal subsegmental pulmonary artery level. No lung mass, consolidation, pleural effusion or pneumothorax. 2. There is a new 5 x 6 mm ground-glass nodule in the right upper lobe. Consider short-term follow-up examination in 3 months to document resolution versus stability. 3. Multiple other nonacute observations, as described above. Aortic Atherosclerosis (ICD10-I70.0). Electronically Signed   By: Ree Molt M.D.   On: 12/09/2023 15:51    Microbiology: No results found for this or any previous visit (from the past 240 hours).   Labs: Basic Metabolic Panel: Recent Labs  Lab 12/31/23 1440 01/01/24 0423 01/02/24 0509  NA 141 140 141  K 4.2 4.1 4.6  CL 103 107 105  CO2 27 24 26   GLUCOSE 101* 102* 83  BUN 20 19 18   CREATININE 1.42* 1.12 1.28*  CALCIUM  9.1 8.9 8.8*   Liver Function Tests: Recent Labs  Lab 12/31/23 1440  AST 30  ALT 25  ALKPHOS 66  BILITOT 0.8  PROT 6.9  ALBUMIN  4.1   No results for input(s):  LIPASE, AMYLASE in the last 168 hours. No results for input(s): AMMONIA in the last 168 hours. CBC: Recent Labs  Lab 12/31/23 1440 01/01/24 0423 01/02/24 0509  WBC 7.1 6.6 5.9  NEUTROABS 5.7  --  3.9  HGB 12.2* 12.8* 12.1*  HCT 38.1* 39.3 37.3*  MCV 96.5 95.6 97.6  PLT 129* 153 125*   Cardiac Enzymes: No results for input(s): CKTOTAL, CKMB, CKMBINDEX, TROPONINI in the last 168 hours. BNP: BNP (last 3 results) Recent Labs    04/10/23 0418 04/15/23 0350 12/31/23 1439  BNP 437.6* 498.8* 112.1*    ProBNP (last 3 results) No results for input(s): PROBNP in the last 8760 hours.  CBG: No results for input(s): GLUCAP in the last 168 hours.     Signed:  Devaughn KATHEE Ban MD.  [facility] 01/02/2024, 12:25 PM

## 2024-01-02 NOTE — Telephone Encounter (Signed)
 Patient Product/process Development Scientist completed.    The patient is insured through [employer]. Patient has [employer], may use a copay card, and/or apply for patient assistance if available.    Ran test claim for Farxiga 10 mg and the current 30 day co-pay is $4.80.  Ran test claim for Jardiance 10 mg and the current 30 day co-pay is $4.80.   This test claim was processed through [facility]- copay amounts may vary at other pharmacies due to pharmacy/plan contracts, or as the patient moves through the different stages of their insurance plan.     Reyes Sharps, CPHT Pharmacy Technician Patient Advocate Specialist [facility] Patient Advocate Team Direct Number: [phone]  Fax: [phone]

## 2024-01-02 NOTE — Final Progress Note (Signed)
 Report called to [address] [facility],  carelink here to pick up patient.

## 2024-01-02 NOTE — Consult Note (Signed)
 ELECTROPHYSIOLOGY CONSULT NOTE    Patient ID: Cory Hess MRN: [id number], DOB/AGE: 1945/03/13 78 y.o.  Admit date: 12/31/2023 Date of Consult: 01/02/2024  Primary Physician: Towana Small, FNP Primary Cardiologist: Lonni Hanson, MD  Electrophysiologist: new to Dr. Cindie     Patient Profile: Cory Hess is a 78 y.o. 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 Kandis.  HPI:  Cory Hess is a 78 y.o. male with PMH as above.   He was recently hospitalized about a month ago for syncopal event requiring TVP while BB washed out. His conduction system improved and he did not require a PPM implant.   He was at [facility] on Saturday and then began to feel dizzy. Pulse ox at that time showed HR to 150s and then HR would slowly reduce to ~ 90. Then HR would increase again to 150s and had dizziness again, so he presented to [facility] ER.   On tele, he has continued to have episodes of tachycardia to 150s, that he is generally asymptomatic of. Unable to start AVN blocking agents d/t prior CHB.   EP was consulted for PPM implant for tachy-brady.   On interview, he feels relatively well currently. He is 1 year out from chemo/radiation to his throat/mouth area, no radiation to chest. He does state that because of the radiation, he is unable to lay his heat flat on the bed. He still has a R port in place that is not being used.    He denies chest pain, dyspnea, PND, orthopnea, nausea, vomiting, dizziness, syncope, edema, weight gain, or early satiety.   Labs Potassium4.1 (11/09 0423)   Creatinine, ser  1.12 (11/09 0423) PLT  125* (11/10 0509) HGB  12.1* (11/10 0509) WBC 5.9  (11/10 0509) Troponin I (High Sensitivity)32* (11/08 1716).    Past Medical History:  Diagnosis Date   Abscess of right arm 03/2023   pt states had absess to right arm after iv was inserted. Pt states he had a wound vac for 2 weeks   Alternating RBBB & LBBB    Ankle swelling 2024   Anticoagulated on apixaban     Aortic atherosclerosis    Arthritis of neck    Bilateral inguinal hernia    Bladder tumor    BPH (benign prostatic hyperplasia)    CAD (coronary artery disease)    a. 08/2017 STEMI/Cath: LM 80ost/d, LAD 100p, LCX 95 ost/p, RCA 60p, 26m, RPDA 80ost; b. 08/2017 CABG x 3: LIMA->LAD, VG->OM, VG->PDA; c. 03/2023 Cardiac Arrest/Cath: Occluded VG->RPDA, patent LIMA->LAD and VG->pLCX. No new native dzs->Med rx. EF 25-35%.   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   CVA (cerebral vascular accident) (HCC) 04/08/2023  a.) brain MRI 04/08/2023: 2 small acute infarctions affecting the cortical brain in the RIGHT frontal region.   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 08/2017 TEE: EF 30-35%, sept/ant/inf HK, apical AK. 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 12/2017 Echo: EF 45-50%, diff HK; c. 09/2022 Echo: EF 55-60%; d. 03/2023 Echo (in settng of PE): EF 40-45%, GrI DD (imaging poor); e. 03/2023 Echo w/ definity : EF 55-60%, nl RV fxn.   Hyperlipidemia LDL goal <70    Hypothyroidism    Ischemic cardiomyopathy    a. 08/2017 TEE: EF  30-35%; b. 12/2017 Echo: EF 45-50%; c. 09/2022 Echo: EF 55-60%.   Long-term use of aspirin  therapy    Nephrolithiasis    PEA (Pulseless electrical activity) (HCC) 04/05/2023   a.) witnessed arrest at [facility] and CPR initiated immediately --> ROSC achieved --> EMS transferred to [facility] with multiple episodes of PEA arrest en route (further CPR required) --> ROSC achieved in ED with further muliple epsides of recurrent PEA arrest lasting secs-mins --> TCP applied and patient taken to cath lab for [facility] and TVP placement   Port-A-Cath in place    Pulmonary embolism (HCC) 04/15/2023   a.) CTA chest 04/15/2023: extensive clot burden with an occlusive clot in the RIGHT upper lobe anterior and posterior segmental arteries, and a non occlusive thrombus in the RIGHT lower lobe main artery and superior and posterior basal segmental arteries --> required thrombectomy   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) LIMA-LAD, SVG-OM, SVG-PDA   Shingles 2018   Status post bilateral cataract extraction    STEMI (ST elevation myocardial infarction) (HCC) 08/27/2017   a.) LHC 08/27/2017: 80% o-dLM, 100% lPAD, 95% o-pLCx, 60% pRCA, 90% mRCA, 80% oRPDA-RPDA --> unable to cross lesions with wire --> IABP placed --> CVTS consulted and patient transferred to [facility]; b.) s/p 3v CABG 08/27/2017: LIMA-LAD, SVG-OM, SVG-RPDA   Thrombocytopenia    Vertigo      Surgical History:  Past Surgical History:  Procedure Laterality Date   CARDIAC CATHETERIZATION     CAROTID PTA/STENT INTERVENTION Right 10/26/2023   Procedure: CAROTID PTA/STENT INTERVENTION;  Surgeon: Marea Selinda RAMAN, MD;  Location: [facility] INVASIVE CV LAB;  Service: Cardiovascular;  Laterality: Right;   CATARACT EXTRACTION W/PHACO Left 07/18/2018   Procedure: CATARACT EXTRACTION PHACO AND INTRAOCULAR LENS PLACEMENT (IOC)  LEFT;  Surgeon: Myrna Adine Anes, MD;  Location: [facility]ERY [facility];  Service: Ophthalmology;  Laterality: Left;   CATARACT EXTRACTION W/PHACO Right  08/14/2018   Procedure: CATARACT EXTRACTION PHACO AND INTRAOCULAR LENS PLACEMENT (IOC)  RIGHT;  Surgeon: Myrna Adine Anes, MD;  Location: [facility] SURGERY [facility];  Service: Ophthalmology;  Laterality: Right;   CORONARY ARTERY BYPASS GRAFT N/A 08/27/2017   Procedure: CORONARY ARTERY BYPASS GRAFTING (CABG) ON PUMP USING LEFT INTERNAL MAMMARY ARTERY AND LEFT GREATER SAPHENOUS VEIN VIA ENDOVEIN HARVEST;  Surgeon: Lucas Dorise POUR, MD;  Location: [facility] OR;  Service: Open Heart Surgery;  Laterality: N/A;   CORONARY/GRAFT ACUTE MI REVASCULARIZATION N/A 08/27/2017   Procedure: Coronary/Graft Acute MI Revascularization;  Surgeon: Darron Deatrice LABOR, MD;  Location: [facility] INVASIVE CV LAB;  Service: Cardiovascular;  Laterality: N/A;   CYSTOSCOPY W/ RETROGRADES Bilateral 12/25/2018   Procedure: CYSTOSCOPY WITH RETROGRADE PYELOGRAM;  Surgeon: Penne Knee, MD;  Location: [facility] ORS;  Service: Urology;  Laterality: Bilateral;   FRACTURE SURGERY Right  1958   arm and left wrist compound fracture, no metal   HERNIA REPAIR Right    inguinial   INGUINAL HERNIA REPAIR Left 12/05/2023   Procedure: REPAIR, HERNIA, INGUINAL, ADULT;  Surgeon: Marinda Jayson KIDD, MD;  Location: [facility] ORS;  Service: General;  Laterality: Left;  open procedure with mesh   INSERTION OF MESH  12/05/2023   Procedure: INSERTION OF MESH;  Surgeon: Marinda Jayson KIDD, MD;  Location: [facility] ORS;  Service: General;;   IR IMAGING GUIDED PORT INSERTION  10/15/2022   LEFT HEART CATH AND CORONARY ANGIOGRAPHY N/A 08/27/2017   Procedure: LEFT HEART CATH AND CORONARY ANGIOGRAPHY;  Surgeon: Darron Deatrice LABOR, MD;  Location: [facility] INVASIVE CV LAB;  Service: Cardiovascular;  Laterality: N/A;   PULMONARY THROMBECTOMY Bilateral 04/18/2023   Procedure: PULMONARY THROMBECTOMY;  Surgeon: Marea Selinda RAMAN, MD;  Location: [facility] INVASIVE CV LAB;  Service: Cardiovascular;  Laterality: Bilateral;   RIGHT/LEFT HEART CATH AND CORONARY/GRAFT ANGIOGRAPHY N/A 04/05/2023   Procedure:  RIGHT/LEFT HEART CATH AND CORONARY/GRAFT ANGIOGRAPHY;  Surgeon: Mady Bruckner, MD;  Location: [facility] INVASIVE CV LAB;  Service: Cardiovascular;  Laterality: N/A;   TEMPORARY PACEMAKER N/A 04/05/2023   Procedure: TEMPORARY PACEMAKER;  Surgeon: Mady Bruckner, MD;  Location: [facility] INVASIVE CV LAB;  Service: Cardiovascular;  Laterality: N/A;   TEMPORARY PACEMAKER N/A 12/10/2023   Procedure: TEMPORARY PACEMAKER;  Surgeon: Florencio Cara BIRCH, MD;  Location: [facility] INVASIVE CV LAB;  Service: Cardiovascular;  Laterality: N/A;   TRANSURETHRAL RESECTION OF BLADDER TUMOR WITH MITOMYCIN -C N/A 12/25/2018   Procedure: TRANSURETHRAL RESECTION OF BLADDER TUMOR WITH Gemcitabine ;  Surgeon: Penne Knee, MD;  Location: [facility] ORS;  Service: Urology;  Laterality: N/A;     Medications Prior to Admission  Medication Sig Dispense Refill Last Dose/Taking   acetaminophen  (TYLENOL ) 500 MG tablet Take 1,000 mg by mouth in the morning and at bedtime.   12/30/2023   apixaban  (ELIQUIS ) 5 MG TABS tablet Take 1 tablet (5 mg total) by mouth 2 (two) times daily.   12/31/2023   aspirin  81 MG chewable tablet Place 1 tablet (81 mg total) into feeding tube daily.   12/31/2023   atorvastatin  (LIPITOR ) 80 MG tablet Place 1 tablet (80 mg total) into feeding tube daily. (Patient taking differently: Take 80 mg by mouth daily.) 90 tablet 3 12/31/2023   famotidine  (PEPCID ) 20 MG tablet Place 1 tablet (20 mg total) into feeding tube daily. (Patient taking differently: Take 20 mg by mouth daily.) 90 tablet 1 12/31/2023   levothyroxine  (SYNTHROID ) 100 MCG tablet Take 1 tablet (100 mcg total) by mouth daily before breakfast. 60 tablet 1 12/31/2023   Multiple Vitamin (MULTIVITAMIN WITH MINERALS) TABS tablet Take 1 tablet by mouth daily.   12/31/2023   oxyCODONE  (ROXICODONE ) 5 MG immediate release tablet Take 1 tablet (5 mg total) by mouth every 8 (eight) hours as needed. 20 tablet 0 Unknown   Blood Pressure Monitoring (BLOOD PRESSURE KIT) KIT 1 each  by Does not apply route in the morning, at noon, and at bedtime. 1 kit 0    [Paused] losartan  (COZAAR ) 25 MG tablet Take 1 tablet (25 mg total) by mouth daily. (Patient not taking: No sig reported) 90 tablet 3 Not Taking   oxyCODONE  (OXY IR/ROXICODONE ) 5 MG immediate release tablet Take 1 tablet (5 mg total) by mouth every 6 (six) hours as needed for severe pain (pain score 7-10). 15 tablet 0     Inpatient Medications:   apixaban   5 mg Oral BID   aspirin   81 mg Per Tube Daily   atorvastatin   80 mg Oral Daily   famotidine   20 mg Oral Daily   levothyroxine   100 mcg Oral QAC breakfast   losartan   25 mg Oral Daily   ondansetron  (ZOFRAN ) IV  4 mg Intravenous Once    Allergies: No Known Allergies  Family History  Problem Relation Age of Onset   Heart failure Mother    Lung disease Father    Stroke Father    Cervical cancer Maternal Grandmother      Physical Exam: Vitals:   01/01/24 0837 01/01/24 1244 01/01/24 1732 01/02/24 0517  BP: (!) 148/80 (!) 145/72 (!) 155/79 (!) 148/78  Pulse: 84 70 98 71  Resp: 15   18  Temp: 98.1 F (36.7 C) 99 F (37.2 C) 98.7 F (37.1 C) 98.2 F (36.8 C)  TempSrc: Oral Oral    SpO2: 99% 97% 96% 98%  Weight:      Height:        GEN- NAD, A&O x 3, normal affect HEENT: Normocephalic, atraumatic Lungs- CTAB, Normal effort.  Heart- Regular rate and rhythm, No M/G/R.  GI- Soft, NT, ND.  Extremities- No clubbing, cyanosis, or edema   Radiology/Studies: DG Chest Portable 1 View Result Date: 12/31/2023 CLINICAL DATA:  Palpitations, syncopal episode. EXAM: PORTABLE CHEST 1 VIEW COMPARISON:  07/17/2023, 12/09/2023. FINDINGS: The heart size and mediastinal contours are within normal limits. Atherosclerotic calcification of the aorta is noted. No consolidation, effusion, or pneumothorax is seen. A right chest port is stable in position. Sternotomy wires are present over the midline. No acute osseous abnormality. IMPRESSION: No active disease. Electronically  Signed   By: Leita Birmingham M.D.   On: 12/31/2023 15:14   ECHOCARDIOGRAM COMPLETE Result Date: 12/11/2023    ECHOCARDIOGRAM REPORT   Patient Name:   ROMEN YUTZY Date of Exam: 12/11/2023 Medical Rec #:  [id number]       Height:       72.0 in Accession #:    [id number]      Weight:       212.5 lb Date of Birth:  Apr 14, 1945       BSA:          2.186 m Patient Age:    78 years        BP:           115/56 mmHg Patient Gender: M               HR:           71 bpm. Exam Location:  [facility] Procedure: 2D Echo, Cardiac Doppler and Color Doppler (Both Spectral and Color            Flow Doppler were utilized during procedure). Indications:     Heart block, Complete I44.2  History:         Patient has prior history of Echocardiogram examinations. CAD.  Sonographer:     Bari Roar Referring Phys:  [id number] RYAN M DUNN Diagnosing Phys: Annabella Scarce MD IMPRESSIONS  1. Left ventricular ejection fraction, by estimation, is 55 to 60%. The left ventricle has normal function. The left ventricle has no regional wall motion abnormalities. There is moderate concentric left ventricular hypertrophy. Left ventricular diastolic parameters are consistent with Grade I diastolic dysfunction (impaired relaxation).  2. Right ventricular systolic function is normal. The right ventricular size is normal.  3. Left atrial size was mildly dilated.  4. The mitral valve is normal in structure. Trivial  mitral valve regurgitation. No evidence of mitral stenosis.  5. The aortic valve is tricuspid. Aortic valve regurgitation is not visualized. No aortic stenosis is present.  6. The inferior vena cava is normal in size with greater than 50% respiratory variability, suggesting right atrial pressure of 3 mmHg. FINDINGS  Left Ventricle: Left ventricular ejection fraction, by estimation, is 55 to 60%. The left ventricle has normal function. The left ventricle has no regional wall motion abnormalities. The left ventricular internal cavity size was normal in  size. There is  moderate concentric left ventricular hypertrophy. Left ventricular diastolic parameters are consistent with Grade I diastolic dysfunction (impaired relaxation). Indeterminate filling pressures. Right Ventricle: The right ventricular size is normal. No increase in right ventricular wall thickness. Right ventricular systolic function is normal. Left Atrium: Left atrial size was mildly dilated. Right Atrium: Right atrial size was normal in size. Pericardium: There is no evidence of pericardial effusion. Mitral Valve: The mitral valve is normal in structure. Trivial mitral valve regurgitation. No evidence of mitral valve stenosis. MV peak gradient, 5.7 mmHg. The mean mitral valve gradient is 2.0 mmHg. Tricuspid Valve: The tricuspid valve is normal in structure. Tricuspid valve regurgitation is trivial. No evidence of tricuspid stenosis. Aortic Valve: The aortic valve is tricuspid. Aortic valve regurgitation is not visualized. No aortic stenosis is present. Aortic valve mean gradient measures 2.0 mmHg. Aortic valve peak gradient measures 4.4 mmHg. Aortic valve area, by VTI measures 2.09 cm. Pulmonic Valve: The pulmonic valve was normal in structure. Pulmonic valve regurgitation is not visualized. No evidence of pulmonic stenosis. Aorta: The aortic root is normal in size and structure. Venous: The inferior vena cava is normal in size with greater than 50% respiratory variability, suggesting right atrial pressure of 3 mmHg. IAS/Shunts: No atrial level shunt detected by color flow Doppler.  LEFT VENTRICLE PLAX 2D LVIDd:         4.60 cm   Diastology LVIDs:         3.20 cm   LV e' medial:    7.62 cm/s LV PW:         1.20 cm   LV E/e' medial:  10.8 LV IVS:        1.40 cm   LV e' lateral:   11.40 cm/s LVOT diam:     1.90 cm   LV E/e' lateral: 7.2 LV SV:         41 LV SV Index:   19 LVOT Area:     2.84 cm  RIGHT VENTRICLE RV Basal diam:  3.60 cm RV Mid diam:    3.20 cm RV S prime:     10.40 cm/s TAPSE (M-mode):  2.1 cm LEFT ATRIUM             Index        RIGHT ATRIUM           Index LA diam:        4.30 cm 1.97 cm/m   RA Area:     20.20 cm LA Vol (A2C):   66.3 ml 30.33 ml/m  RA Volume:   56.70 ml  25.93 ml/m LA Vol (A4C):   67.0 ml 30.65 ml/m LA Biplane Vol: 69.3 ml 31.70 ml/m  AORTIC VALVE                    PULMONIC VALVE AV Area (Vmax):    2.09 cm     PV Vmax:        1.48  m/s AV Area (Vmean):   1.82 cm     PV Peak grad:   8.8 mmHg AV Area (VTI):     2.09 cm     RVOT Peak grad: 2 mmHg AV Vmax:           105.00 cm/s AV Vmean:          72.700 cm/s AV VTI:            0.194 m AV Peak Grad:      4.4 mmHg AV Mean Grad:      2.0 mmHg LVOT Vmax:         77.50 cm/s LVOT Vmean:        46.600 cm/s LVOT VTI:          0.143 m LVOT/AV VTI ratio: 0.74  AORTA Ao Root diam: 3.10 cm Ao Asc diam:  3.60 cm MITRAL VALVE                TRICUSPID VALVE MV Area (PHT): 4.44 cm     TR Peak grad:   21.7 mmHg MV Area VTI:   1.61 cm     TR Vmax:        233.00 cm/s MV Peak grad:  5.7 mmHg MV Mean grad:  2.0 mmHg     SHUNTS MV Vmax:       1.19 m/s     Systemic VTI:  0.14 m MV Vmean:      63.7 cm/s    Systemic Diam: 1.90 cm MV Decel Time: 171 msec MV E velocity: 82.00 cm/s MV A velocity: 120.00 cm/s MV E/A ratio:  0.68 MV A Prime:    17.3 cm/s Annabella Scarce MD Electronically signed by Annabella Scarce MD Signature Date/Time: 12/11/2023/10:51:20 AM    Final    CT ANGIO NECK W OR WO CONTRAST Result Date: 12/10/2023 EXAM: CTA Neck 12/10/2023 10:39:42 AM TECHNIQUE: CT of the neck was performed without and with the administration of 75 mL of iohexol  (OMNIPAQUE ) 350 MG/ML injection. Multiplanar 2D and/or 3D reformatted images are provided for review. Automated exposure control, iterative reconstruction, and/or weight based adjustment of the mA/kV was utilized to reduce the radiation dose to as low as reasonably achievable. Stenosis of the internal carotid arteries measured using NASCET criteria. COMPARISON: None available CLINICAL HISTORY:  Syncope/presyncope, cerebrovascular cause suspected. FINDINGS: AORTIC ARCH AND ARCH VESSELS: Mild atherosclerosis of the aortic arch. No dissection or arterial injury. No significant stenosis of the brachiocephalic or subclavian arteries. CERVICAL CAROTID ARTERIES: The right carotid artery is patent from the origin to the skull base. Mixed atherosclerotic plaque at the carotid bifurcation and right carotid bulb results in approximately 55% stenosis of the right ICA origin. There is an additional focal prominent region of noncalcified atherosclerotic plaque in the proximal right cervical ICA with a similar degree of narrowing. The left carotid artery is patent from the origin to the skull base. Atherosclerosis at the left carotid bifurcation without hemodynamically significant stenosis. No dissection or arterial injury. CERVICAL VERTEBRAL ARTERIES: Vertebral arteries are patent from the origins to the vertebrobasilar confluence. Atherosclerosis at the right vertebral artery origin results in moderate stenosis. There is additional mild stenosis at the left vertebral artery origin. Tortuosity of the left V1 segment. The intracranial vertebral arteries are patent. There is an atretic appearance of the post PICA segment of the left vertebral artery with significant distal tapering of the left V4 segment without evidence of occlusion. No dissection or arterial injury. LUNGS AND MEDIASTINUM: Right  chest wall port-a-cath is partially visualized. Sequelae of median sternotomy. Lungs are unremarkable. SOFT TISSUES: Bilateral lens replacement. Mucosal thickening and mucous retention cyst in the left maxillary sinus. No acute abnormality. BONES: No acute abnormality. LIMITATIONS/ARTIFACTS AND INCIDENTAL INTRACRANIAL FINDINGS: Limited visualization of intracranial structures. There is a small remote infarct noted within the superior right cerebellum. IMPRESSION: 1. Mixed atherosclerotic plaque at the right carotid bifurcation  and bulb resulting in approximately 55% stenosis of the right ICA origin. 2. Moderate stenosis at the right vertebral artery origin. 3. Additional atherosclerosis as above. 4. 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 Electronically signed by: Donnice Mania MD 12/10/2023 10:55 AM EDT RP Workstation: [id number]   CT Angio Abd/Pel W and/or Wo Contrast Result Date: 12/09/2023 CLINICAL DATA:  History of left inguinal hernia repair with hematoma. Suspicion for active bleeding. EXAM: CTA ABDOMEN AND PELVIS WITHOUT AND WITH CONTRAST TECHNIQUE: Multidetector CT imaging of the abdomen and pelvis was performed using the standard protocol during bolus administration of intravenous contrast. Multiplanar reconstructed images and MIPs were obtained and reviewed to evaluate the vascular anatomy. RADIATION DOSE REDUCTION: This exam was performed according to the departmental dose-optimization program which includes automated exposure control, adjustment of the mA and/or kV according to patient size and/or use of iterative reconstruction technique. CONTRAST:  OMNIPAQUE  IOHEXOL  350 MG/ML SOLN COMPARISON:  Chest CT scan earlier, same date. Prior abdominal CT scan and 07/17/2023 FINDINGS: VASCULAR Aorta: Stable moderate to advanced age related atherosclerotic calcifications but no aneurysm. Sizable ulcerated plaque or focal dissection noted involving the posterior aspect of the lower abdominal aorta on the left side measuring approximately 2.2 cm. Celiac: Atherosclerotic calcifications but no significant stenosis or dissection. SMA: Atherosclerotic calcifications but no significant stenosis. Renals: 2 renal arteries bilaterally. Two right and 3 left renal arteries no significant stenosis. Scattered atherosclerotic calcifications. IMA: Patent Inflow: Moderate atherosclerotic calcifications but no stenosis or dissection. Proximal Outflow: No significant findings. Scattered calcifications. Veins: No significant  findings. Review of the MIP images confirms the above findings. NON-VASCULAR Lower chest: No acute pulmonary findings. Pulmonary scarring changes. Hepatobiliary: No hepatic lesions or intrahepatic biliary dilatation. Gallbladder is unremarkable. No common bile duct dilatation. Pancreas: Unremarkable. No pancreatic ductal dilatation or surrounding inflammatory changes. Spleen: Normal in size without focal abnormality. Adrenals/Urinary Tract: Adrenal glands and kidneys are unremarkable. There is contrast in the renal collecting systems from prior CT scan. Bilateral renal cysts, unchanged. The bladder is unremarkable. Stomach/Bowel: The stomach, duodenum, small bowel and colon are grossly normal without oral contrast. No inflammatory changes, mass lesions or obstructive findings. The appendix is normal. Lymphatic: No abdominal or pelvic lymphadenopathy. Reproductive: Enlarged prostate gland. The seminal vesicles are unremarkable. Other: There is a sizable left groin hematoma measuring approximately 7.8 x 2.7 cm. This is anterior to the inguinal canal. No extravasation of contrast material to suggest active bleeding. Small tortuous vessels in the spermatic cords bilaterally. Musculoskeletal: No significant bony findings. IMPRESSION: 1. Sizable left groin hematoma but no extravasation of contrast material to suggest active bleeding. 2. Sizable ulcerated plaque or focal dissection involving the posterior aspect of the lower abdominal aorta on the left side measuring approximately 2.2 cm. 3. No acute abdominal/pelvic findings, mass lesions or adenopathy. 4. Enlarged prostate gland. 5. Aortic atherosclerosis. Aortic Atherosclerosis (ICD10-I70.0). Electronically Signed   By: MYRTIS Stammer M.D.   On: 12/09/2023 18:02   CT Angio Chest PE W and/or Wo Contrast Result Date: 12/09/2023 CLINICAL DATA:  Pulmonary embolism (PE) suspected, high prob. Near syncopal  episode. History of tongue carcinoma. * Tracking Code: BO * EXAM:  CT ANGIOGRAPHY CHEST WITH CONTRAST TECHNIQUE: Multidetector CT imaging of the chest was performed using the standard protocol during bolus administration of intravenous contrast. Multiplanar CT image reconstructions and MIPs were obtained to evaluate the vascular anatomy. RADIATION DOSE REDUCTION: This exam was performed according to the departmental dose-optimization program which includes automated exposure control, adjustment of the mA and/or kV according to patient size and/or use of iterative reconstruction technique. CONTRAST:  75mL OMNIPAQUE  IOHEXOL  350 MG/ML SOLN COMPARISON:  CT angiography chest from 07/17/2023. FINDINGS: Cardiovascular: No evidence of embolism to the proximal subsegmental pulmonary artery level. Mild cardiomegaly. No pericardial effusion. No aortic aneurysm. There are coronary artery calcifications, in keeping with coronary artery disease. There are also mild peripheral atherosclerotic vascular calcifications of thoracic aorta and its major branches. Mediastinum/Nodes: Visualized thyroid  gland appears grossly unremarkable. No solid / cystic mediastinal masses. The esophagus is nondistended precluding optimal assessment. No axillary, mediastinal or hilar lymphadenopathy by size criteria. Lungs/Pleura: The central tracheo-bronchial tree is patent. There is mild, smooth, circumferential thickening of the segmental and subsegmental bronchial walls, throughout bilateral lungs, which is nonspecific. Findings are most commonly seen with bronchitis or reactive airway disease, such as asthma. There are dependent changes in bilateral lungs. No mass or consolidation. No pleural effusion or pneumothorax. There is a new 5 x 6 mm ground-glass nodule in the right upper lobe (series 5, image 62). Upper Abdomen: Visualized upper abdominal viscera within normal limits. Musculoskeletal: The visualized soft tissues of the chest wall are grossly unremarkable. No suspicious osseous lesions. There are mild to  moderate multilevel degenerative changes in the visualized spine. Review of the MIP images confirms the above findings. IMPRESSION: 1. No embolism to the proximal subsegmental pulmonary artery level. No lung mass, consolidation, pleural effusion or pneumothorax. 2. There is a new 5 x 6 mm ground-glass nodule in the right upper lobe. Consider short-term follow-up examination in 3 months to document resolution versus stability. 3. Multiple other nonacute observations, as described above. Aortic Atherosclerosis (ICD10-I70.0). Electronically Signed   By: Ree Molt M.D.   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 BB was washed out at that time with recovery of AV conduction. He presented to [facility] ER with dizziness associated with tachycardia, found to be Atach on tele. Thankfully, he has not had any bradycardia during this hospitalization He will need PPM implant for tachy-brady to allow AVN medications to treat ATach He is unable to lay head flat d/t throat/mouth radiation He continues on eliquis  BID for recent PE (03/2023). Given these elevated risks of PPM implant, will transfer patient to [facility] for PPM implant   #) CAD s/p CABG No ischemic s/s Continue lipitor          For questions or updates, please contact [facility] Please consult [email or web address] for contact info under Cardiology/STEMI.  Signed, [name], NP  01/02/2024 7:45 AM

## 2024-01-02 NOTE — H&P (Addendum)
 History and Physical   Cory Hess FMW:[id number] DOB: 1945-03-04 DOA: 01/02/2024  PCP: Towana Small, FNP   Patient coming from: [facility]/Home  Chief Complaint:  Near Syncope / Tachy-Brady Syndrome  HPI: Cory Hess is a 78 y.o. male with medical history significant of diabetes, hypertension, hyperlipidemia, hypothyroidism, carotid artery disease, CAD status post CABG, complete heart heart block, trifascicular block, GERD, chronic diastolic CHF, tongue cancer status post radiation and**, BPH, PE, cardiac arrest presenting with near syncope and tacky bradycardia syndrome.  Patient transferring from [facility] where he presented for near syncopal episode.  Patient had been previously admitted at [facility] 10/17-10/25 due to near syncopal episode where he was found to be in complete heart block.  This is in the setting of his known prior trifascicular block.  He received atropine  and dopamine  in the ICU as well as temporary pacing.  Beta-blocker washed out and he was able to be taken off temporary pacemaker without permanent pacemaker placement.  Eilene presented 11/8 with near syncope at a restaurant with lightheadedness, off sensation that he was going to pass out, ringing in his ears.  In the ED he is found to be in atrial versus sinus tachycardia.  Cardiology and then electrophysiology consulted.  Nodal blocking agents held, permissive tachycardia advised until pacemaker able to be placed.  Patient continued on aspirin , statin.  ARB held.  With history of PE and PEA arrest earlier this year patient to be continued on Eliquis  while having pacemaker placed.  Patient denies fevers, chills, chest pain, shortness of breath, abdominal pain, constipation, diarrhea, nausea, vomiting..  Of note, patient has history of tongue cancer last year and is status post chemotherapy and radiation  Course: Vital signs at [facility] notable for blood pressure in 130s-160s.  Initially tachycardic in the 100s-110s,  currently in the 80s-90s.  Lab workup this morning showed creatinine stable at 1.28, calcium  8.8.  CBC with hemoglobin stable at 12.1, platelets stable at 125.  Chest x-ray on 11/8 showed no acute normality.   Patient continued on home medications with losartan  held as above.  As per HPI, cardiology and EP were consulted with plan for permissive tachycardia and pacemaker placement on 11/11.  To remain on anticoagulation given history of PE and PEA arrest earlier this year.  Review of Systems: As per HPI otherwise all other systems reviewed and are negative.  Past Medical History:  Diagnosis Date   Abscess of right arm 03/2023   pt states had absess to right arm after iv was inserted. Pt states he had a wound vac for 2 weeks   Acute kidney injury 04/06/2023   Acute on chronic HFrEF (heart failure with reduced ejection fraction) (HCC) 04/05/2023   Acute respiratory failure with hypoxia and hypercapnia (HCC) 04/10/2023   Alternating RBBB & LBBB    Ankle swelling 2024   Anticoagulated on apixaban     Aortic atherosclerosis    Arthritis of neck    Bilateral inguinal hernia    Bladder tumor    BPH (benign prostatic hyperplasia)    CAD (coronary artery disease)    a. 08/2017 STEMI/Cath: LM 80ost/d, LAD 100p, LCX 95 ost/p, RCA 60p, 62m, RPDA 80ost; b. 08/2017 CABG x 3: LIMA->LAD, VG->OM, VG->PDA; c. 03/2023 Cardiac Arrest/Cath: Occluded VG->RPDA, patent LIMA->LAD and VG->pLCX. No new native dzs->Med rx. EF 25-35%.   Cancer of base of tongue (HCC) 09/22/2022   a.) pathology (+) for stage II (cT2, cN2, cM0, p16+) - Tx'd with cisplatin    Cardiogenic shock (HCC)  04/05/2023   Cardiomegaly    Carotid stenosis    a.) s/p PTA with stent to RIGHT ICA 10/26/2023   Cerebral microvascular disease    Cholelithiasis    CKD (chronic kidney disease), stage [name] (HCC)    Complete heart block (HCC) 04/05/2023   a.) resulted in multiple episodes of PEA arrest --> stabilized with TCP --> TVP placed during LHC.    CVA (cerebral vascular accident) (HCC) 04/08/2023   a.) brain MRI 04/08/2023: 2 small acute infarctions affecting the cortical brain in the RIGHT frontal region.   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 08/2017 TEE: EF 30-35%, sept/ant/inf HK, apical AK. 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 12/2017 Echo: EF 45-50%, diff HK; c. 09/2022 Echo: EF 55-60%; d. 03/2023 Echo (in settng of PE): EF 40-45%, GrI DD (imaging poor); e. 03/2023 Echo w/ definity : EF 55-60%, nl RV fxn.   Hyperlipidemia LDL goal <70    Hypothyroidism    Ischemic cardiomyopathy    a. 08/2017 TEE: EF 30-35%; b. 12/2017 Echo: EF 45-50%; c. 09/2022 Echo: EF 55-60%.   Long-term use of aspirin  therapy    Nephrolithiasis    PEA (Pulseless electrical activity) (HCC) 04/05/2023   a.) witnessed arrest at [facility] and CPR initiated immediately --> ROSC achieved --> EMS transferred to [facility] with multiple episodes of PEA arrest en route (further CPR required) --> ROSC achieved in ED with further muliple epsides of recurrent PEA arrest lasting secs-mins --> TCP applied and patient taken to cath lab for [facility] and TVP placement   Port-A-Cath in place    Pulmonary embolism (HCC) 04/15/2023   a.) CTA chest 04/15/2023: extensive clot burden with an occlusive clot in the RIGHT upper lobe anterior and posterior segmental arteries, and a non occlusive thrombus in the RIGHT lower lobe main artery and superior and posterior basal segmental arteries --> required thrombectomy   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) LIMA-LAD, SVG-OM, SVG-PDA   Shingles 2018   Status post bilateral cataract extraction    STEMI (ST elevation myocardial infarction) (HCC) 08/27/2017   a.) LHC 08/27/2017: 80% o-dLM, 100% lPAD, 95% o-pLCx, 60% pRCA, 90% mRCA, 80% oRPDA-RPDA --> unable to cross lesions with wire --> IABP placed --> CVTS consulted and patient  transferred to [facility]; b.) s/p 3v CABG 08/27/2017: LIMA-LAD, SVG-OM, SVG-RPDA   Syncope 12/31/2023   Thrombocytopenia    Type 2 diabetes mellitus without complications (HCC) 06/07/2023   Vertigo     Past Surgical History:  Procedure Laterality Date   CARDIAC CATHETERIZATION     CAROTID PTA/STENT INTERVENTION Right 10/26/2023   Procedure: CAROTID PTA/STENT INTERVENTION;  Surgeon: Marea Selinda RAMAN, MD;  Location: [facility] INVASIVE CV LAB;  Service: Cardiovascular;  Laterality: Right;   CATARACT EXTRACTION W/PHACO Left 07/18/2018   Procedure: CATARACT EXTRACTION PHACO AND INTRAOCULAR LENS PLACEMENT (IOC)  LEFT;  Surgeon: Myrna Adine Anes, MD;  Location: [facility]ERY [facility];  Service: Ophthalmology;  Laterality: Left;   CATARACT EXTRACTION W/PHACO Right 08/14/2018   Procedure: CATARACT EXTRACTION PHACO AND INTRAOCULAR LENS PLACEMENT (IOC)  RIGHT;  Surgeon: Myrna Adine Anes, MD;  Location: [facility];  Service: Ophthalmology;  Laterality: Right;   CORONARY ARTERY BYPASS GRAFT N/A 08/27/2017   Procedure: CORONARY ARTERY BYPASS GRAFTING (CABG) ON PUMP USING LEFT INTERNAL MAMMARY ARTERY AND LEFT GREATER SAPHENOUS VEIN VIA  ENDOVEIN HARVEST;  Surgeon: Lucas Dorise POUR, MD;  Location: [facility] OR;  Service: Open Heart Surgery;  Laterality: N/A;   CORONARY/GRAFT ACUTE MI REVASCULARIZATION N/A 08/27/2017   Procedure: Coronary/Graft Acute MI Revascularization;  Surgeon: Darron Deatrice LABOR, MD;  Location: [facility] INVASIVE CV LAB;  Service: Cardiovascular;  Laterality: N/A;   CYSTOSCOPY W/ RETROGRADES Bilateral 12/25/2018   Procedure: CYSTOSCOPY WITH RETROGRADE PYELOGRAM;  Surgeon: Penne Knee, MD;  Location: [facility] ORS;  Service: Urology;  Laterality: Bilateral;   FRACTURE SURGERY Right 1958   arm and left wrist compound fracture, no metal   HERNIA REPAIR Right    inguinial   INGUINAL HERNIA REPAIR Left 12/05/2023   Procedure: REPAIR, HERNIA, INGUINAL, ADULT;  Surgeon: Marinda Jayson KIDD, MD;  Location:  [facility] ORS;  Service: General;  Laterality: Left;  open procedure with mesh   INSERTION OF MESH  12/05/2023   Procedure: INSERTION OF MESH;  Surgeon: Marinda Jayson KIDD, MD;  Location: [facility] ORS;  Service: General;;   IR IMAGING GUIDED PORT INSERTION  10/15/2022   LEFT HEART CATH AND CORONARY ANGIOGRAPHY N/A 08/27/2017   Procedure: LEFT HEART CATH AND CORONARY ANGIOGRAPHY;  Surgeon: Darron Deatrice LABOR, MD;  Location: [facility] INVASIVE CV LAB;  Service: Cardiovascular;  Laterality: N/A;   PULMONARY THROMBECTOMY Bilateral 04/18/2023   Procedure: PULMONARY THROMBECTOMY;  Surgeon: Marea Selinda RAMAN, MD;  Location: [facility] INVASIVE CV LAB;  Service: Cardiovascular;  Laterality: Bilateral;   RIGHT/LEFT HEART CATH AND CORONARY/GRAFT ANGIOGRAPHY N/A 04/05/2023   Procedure: RIGHT/LEFT HEART CATH AND CORONARY/GRAFT ANGIOGRAPHY;  Surgeon: Mady Bruckner, MD;  Location: [facility] INVASIVE CV LAB;  Service: Cardiovascular;  Laterality: N/A;   TEMPORARY PACEMAKER N/A 04/05/2023   Procedure: TEMPORARY PACEMAKER;  Surgeon: Mady Bruckner, MD;  Location: [facility] INVASIVE CV LAB;  Service: Cardiovascular;  Laterality: N/A;   TEMPORARY PACEMAKER N/A 12/10/2023   Procedure: TEMPORARY PACEMAKER;  Surgeon: Florencio Cara BIRCH, MD;  Location: [facility] INVASIVE CV LAB;  Service: Cardiovascular;  Laterality: N/A;   TRANSURETHRAL RESECTION OF BLADDER TUMOR WITH MITOMYCIN -C N/A 12/25/2018   Procedure: TRANSURETHRAL RESECTION OF BLADDER TUMOR WITH Gemcitabine ;  Surgeon: Penne Knee, MD;  Location: [facility] ORS;  Service: Urology;  Laterality: N/A;    Social History  reports that he quit smoking about 25 years ago. His smoking use included cigarettes. He has been exposed to tobacco smoke. He has quit using smokeless tobacco.  His smokeless tobacco use included snuff. He reports that he does not currently use alcohol. He reports that he does not currently use drugs.  No Known Allergies  Family History  Problem Relation Age of Onset   Heart failure  Mother    Lung disease Father    Stroke Father    Cervical cancer Maternal Grandmother   Reviewed on admission  Prior to Admission medications   Medication Sig Start Date End Date Taking? Authorizing Provider  acetaminophen  (TYLENOL ) 500 MG tablet Take 1,000 mg by mouth in the morning and at bedtime.    [provider]  apixaban  (ELIQUIS ) 5 MG TABS tablet Take 1 tablet (5 mg total) by mouth 2 (two) times daily. 12/07/23   Marinda Jayson KIDD, MD  aspirin  81 MG chewable tablet Place 1 tablet (81 mg total) into feeding tube daily. 12/07/23   Marinda Jayson KIDD, MD  atorvastatin  (LIPITOR ) 80 MG tablet Place 1 tablet (80 mg total) into feeding tube daily. Patient taking differently: Take 80 mg by mouth daily. 09/13/23   Abigail Bernardino HERO, PA-C  Blood Pressure Monitoring (BLOOD PRESSURE KIT)  KIT 1 each by Does not apply route in the morning, at noon, and at bedtime. 07/25/23   Towana Small, FNP  famotidine  (PEPCID ) 20 MG tablet Place 1 tablet (20 mg total) into feeding tube daily. Patient taking differently: Take 20 mg by mouth daily. 09/28/23   Vivienne Lonni Ingle, NP  levothyroxine  (SYNTHROID ) 100 MCG tablet Take 1 tablet (100 mcg total) by mouth daily before breakfast. 08/23/23   Brahmanday, Govinda R, MD  losartan  (COZAAR ) 25 MG tablet Take 1 tablet (25 mg total) by mouth daily. Patient not taking: No sig reported 08/11/23 12/27/23  Vivienne Lonni Ingle, NP  Multiple Vitamin (MULTIVITAMIN WITH MINERALS) TABS tablet Take 1 tablet by mouth daily.    [provider]  oxyCODONE  (ROXICODONE ) 5 MG immediate release tablet Take 1 tablet (5 mg total) by mouth every 8 (eight) hours as needed. 12/08/23 12/07/24  Marinda Jayson KIDD, MD    Physical Exam: Vitals:   01/02/24 1600 01/02/24 1611  BP: (!) 153/72   Pulse: 82   Resp: 18   Temp: 98.2 F (36.8 C)   TempSrc: Oral   SpO2: 98%   Weight:  90.9 kg  Height:  6' (1.829 m)    Physical Exam Constitutional:      General: He is not  in acute distress.    Appearance: Normal appearance.  HENT:     Head: Normocephalic and atraumatic.     Mouth/Throat:     Mouth: Mucous membranes are moist.     Pharynx: Oropharynx is clear.  Eyes:     Extraocular Movements: Extraocular movements intact.     Pupils: Pupils are equal, round, and reactive to light.  Cardiovascular:     Rate and Rhythm: Normal rate and regular rhythm.     Pulses: Normal pulses.     Heart sounds: Normal heart sounds.  Pulmonary:     Effort: Pulmonary effort is normal. No respiratory distress.     Breath sounds: Normal breath sounds.  Abdominal:     General: Bowel sounds are normal. There is no distension.     Palpations: Abdomen is soft.     Tenderness: There is no abdominal tenderness.  Musculoskeletal:        General: No swelling or deformity.  Skin:    General: Skin is warm and dry.  Neurological:     General: No focal deficit present.     Mental Status: Mental status is at baseline.    Labs on Admission: I have personally reviewed following labs and imaging studies  CBC: Recent Labs  Lab 12/31/23 1440 01/01/24 0423 01/02/24 0509  WBC 7.1 6.6 5.9  NEUTROABS 5.7  --  3.9  HGB 12.2* 12.8* 12.1*  HCT 38.1* 39.3 37.3*  MCV 96.5 95.6 97.6  PLT 129* 153 125*    Basic Metabolic Panel: Recent Labs  Lab 12/31/23 1440 01/01/24 0423 01/02/24 0509  NA 141 140 141  K 4.2 4.1 4.6  CL 103 107 105  CO2 27 24 26   GLUCOSE 101* 102* 83  BUN 20 19 18   CREATININE 1.42* 1.12 1.28*  CALCIUM  9.1 8.9 8.8*    GFR: Estimated Creatinine Clearance: 53 mL/min (A) (by C-G formula based on SCr of 1.28 mg/dL (H)).  Liver Function Tests: Recent Labs  Lab 12/31/23 1440  AST 30  ALT 25  ALKPHOS 66  BILITOT 0.8  PROT 6.9  ALBUMIN  4.1    Urine analysis:    Component Value Date/Time   COLORURINE YELLOW (A) 07/17/2023  1936   APPEARANCEUR CLEAR (A) 07/17/2023 1936   APPEARANCEUR Hazy (A) 04/17/2019 1454   LABSPEC 1.044 (H) 07/17/2023 1936    PHURINE 7.0 07/17/2023 1936   GLUCOSEU NEGATIVE 07/17/2023 1936   HGBUR NEGATIVE 07/17/2023 1936   BILIRUBINUR NEGATIVE 07/17/2023 1936   BILIRUBINUR Negative 04/17/2019 1454   KETONESUR NEGATIVE 07/17/2023 1936   PROTEINUR NEGATIVE 07/17/2023 1936   NITRITE NEGATIVE 07/17/2023 1936   LEUKOCYTESUR NEGATIVE 07/17/2023 1936    Radiological Exams on Admission: No results found.  EKG: Independently reviewed.  Initial EKG on 11/8 showed wide QRS tachycardia at 114 bpm.  QT prolongation of 551.  Right bundle blanch block.  Similar to previous morphology.  No repeat EKG thus far.  Assessment/Plan Principal Problem:   Tachycardia-bradycardia syndrome (HCC) Active Problems:   Hx of CABG   Coronary artery disease   Near syncope   Essential hypertension   Acquired hypothyroidism   CHB (complete heart block) (HCC)   History of CVA (cerebrovascular accident)   History of pulmonary embolus (PE)   Carotid stenosis   Chronic diastolic CHF (congestive heart failure) (HCC)   Benign prostatic hyperplasia without lower urinary tract symptoms   Gastro-esophageal reflux disease without esophagitis   Hyperlipidemia, unspecified  Near-syncope Tachycardia-bradycardia syndrome Trifascicular block History of complete heart block > Patient presented to [facility] with near syncope.  This was second presentation for near syncope in the past month.  Initially found to have complete heart block which improved after beta-blocker washout.  Was on temporary pacer in ICU but did not have PPM due to improvement in conduction. > Now presents with near syncope with tachycardia.  Given history of complete heart block no notable blocking agents given and permissive tachycardia implemented while awaiting placement of PPM. > Cardiology and EP following at [facility] with plan to transfer here today for likely PPM placement 11/11. - Patient monitoring on telemetry for now - Cardiology/EP notified of patient arrival - Avoid  nodal blocking agents - Continue to hold losartan  - N.p.o. at midnight  Hypertension - Holding losartan  as above  Hyperlipidemia - Continue home statin  Carotid artery disease - Continue statin  CAD > Status post CABG - Continue ASA, statin, Eliquis   History of PE History of cardiac arrest > Earlier this year had PEA and PEA arrest. - Continue Eliquis   Chronic diastolic CHF > Last echo was in October of this year with EF 55-60%, G1 DD, normal RV function. - Not on diuretic - Continue to monitor  Tongue cancer > Status post chemotherapy and radiation.  Chronic pain - Continue home oxycodone   DVT prophylaxis: Eliquis  Code Status:   Full Family Communication:  None on admission  Disposition Plan:   Patient is from:  [facility]  Anticipated DC to:  Home  Anticipated DC date:  2 to 3 days  Anticipated DC barriers: None  Consults called:  Cardiology/electrophysiology Admission status:  Inpatient, telemetry  Severity of Illness: The appropriate patient status for this patient is INPATIENT. Inpatient status is judged to be reasonable and necessary in order to provide the required intensity of service to ensure the patient's safety. The patient's presenting symptoms, physical exam findings, and initial radiographic and laboratory data in the context of their chronic comorbidities is felt to place them at high risk for further clinical deterioration. Furthermore, it is not anticipated that the patient will be medically stable for discharge from the hospital within 2 midnights of admission.   * I certify that at the point of admission  it is my clinical judgment that the patient will require inpatient hospital care spanning beyond 2 midnights from the point of admission due to high intensity of service, high risk for further deterioration and high frequency of surveillance required.DEWAINE Marsa KATHEE Seena MD [facility]  How to contact the [facility] Attending or Consulting  provider 7A - 7P or covering provider during after hours 7P -7A, for this patient?   Check the care team in [facility] and look for a) attending/consulting [facility] provider listed and b) the [facility] team listed Log into [email or web address] and use [place] universal password to access. If you do not have the password, please contact the hospital operator. Locate the [facility] provider you are looking for under [facility] and page to a number that you can be directly reached. If you still have difficulty reaching the provider, please page the [facility] (Director on Call) for the Hospitalists listed on amion for assistance.  01/02/2024, 4:57 PM

## 2024-01-03 ENCOUNTER — Ambulatory Visit: Admitting: Internal Medicine

## 2024-01-03 ENCOUNTER — Inpatient Hospital Stay (HOSPITAL_COMMUNITY)
Admission: EM | Disposition: A | Discharge status: Home / Self Care | Payer: Self-pay | Source: Ambulatory Visit | Attending: Internal Medicine

## 2024-01-03 ENCOUNTER — Inpatient Hospital Stay

## 2024-01-03 ENCOUNTER — Ambulatory Visit (HOSPITAL_COMMUNITY): Admission: RE | Admit: 2024-01-03 | Source: Home / Self Care | Admitting: Cardiology

## 2024-01-03 ENCOUNTER — Inpatient Hospital Stay: Admitting: Internal Medicine

## 2024-01-03 ENCOUNTER — Encounter (HOSPITAL_COMMUNITY): Payer: Self-pay | Admitting: Internal Medicine

## 2024-01-03 ENCOUNTER — Inpatient Hospital Stay (HOSPITAL_COMMUNITY): Admitting: Certified Registered Nurse Anesthetist

## 2024-01-03 ENCOUNTER — Other Ambulatory Visit

## 2024-01-03 DIAGNOSIS — I2510 Atherosclerotic heart disease of native coronary artery without angina pectoris: Secondary | ICD-10-CM

## 2024-01-03 DIAGNOSIS — I4430 Unspecified atrioventricular block: Secondary | ICD-10-CM

## 2024-01-03 DIAGNOSIS — I5032 Chronic diastolic (congestive) heart failure: Secondary | ICD-10-CM | POA: Diagnosis not present

## 2024-01-03 DIAGNOSIS — I110 Hypertensive heart disease with heart failure: Secondary | ICD-10-CM

## 2024-01-03 DIAGNOSIS — Z87891 Personal history of nicotine dependence: Secondary | ICD-10-CM

## 2024-01-03 DIAGNOSIS — I495 Sick sinus syndrome: Secondary | ICD-10-CM | POA: Diagnosis not present

## 2024-01-03 LAB — COMPREHENSIVE METABOLIC PANEL WITH GFR
ALT: 20 U/L (ref 0–44)
AST: 21 U/L (ref 15–41)
Albumin: 3.2 g/dL — ABNORMAL LOW (ref 3.5–5.0)
Alkaline Phosphatase: 58 U/L (ref 38–126)
Anion gap: 10 (ref 5–15)
BUN: 18 mg/dL (ref 8–23)
CO2: 25 mmol/L (ref 22–32)
Calcium: 8.9 mg/dL (ref 8.9–10.3)
Chloride: 105 mmol/L (ref 98–111)
Creatinine, Ser: 1.3 mg/dL — ABNORMAL HIGH (ref 0.61–1.24)
GFR, Estimated: 57 mL/min — ABNORMAL LOW (ref >60)
Glucose, Bld: 98 mg/dL (ref 70–99)
Potassium: 4.4 mmol/L (ref 3.5–5.1)
Sodium: 140 mmol/L (ref 135–145)
Total Bilirubin: 0.8 mg/dL (ref 0.0–1.2)
Total Protein: 5.9 g/dL — ABNORMAL LOW (ref 6.5–8.1)

## 2024-01-03 LAB — CBC
HCT: 36.8 % — ABNORMAL LOW (ref 39.0–52.0)
Hemoglobin: 12.3 g/dL — ABNORMAL LOW (ref 13.0–17.0)
MCH: 31.5 pg (ref 26.0–34.0)
MCHC: 33.4 g/dL (ref 30.0–36.0)
MCV: 94.4 fL (ref 80.0–100.0)
Platelets: 129 K/uL — ABNORMAL LOW (ref 150–400)
RBC: 3.9 MIL/uL — ABNORMAL LOW (ref 4.22–5.81)
RDW: 13.4 % (ref 11.5–15.5)
WBC: 5.6 K/uL (ref 4.0–10.5)
nRBC: 0 % (ref 0.0–0.2)

## 2024-01-03 LAB — MAGNESIUM: Magnesium: 1.9 mg/dL (ref 1.7–2.4)

## 2024-01-03 MED ORDER — HEPARIN (PORCINE) IN NACL 1000-0.9 UT/500ML-% IV SOLN
INTRAVENOUS | Status: DC | PRN
  Administered 2024-01-03: 500 mL

## 2024-01-03 MED ORDER — CEFAZOLIN SODIUM-DEXTROSE 2-4 GM/100ML-% IV SOLN
2.0000 g | INTRAVENOUS | Status: AC
  Administered 2024-01-03: 2 g via INTRAVENOUS

## 2024-01-03 MED ORDER — ONDANSETRON HCL 4 MG/2ML IJ SOLN
INTRAMUSCULAR | Status: DC | PRN
  Administered 2024-01-03: 4 mg via INTRAVENOUS

## 2024-01-03 MED ORDER — PROPOFOL 500 MG/50ML IV EMUL
INTRAVENOUS | Status: DC | PRN
  Administered 2024-01-03: 10 mg via INTRAVENOUS

## 2024-01-03 MED ORDER — HEPARIN SODIUM (PORCINE) 1000 UNIT/ML IJ SOLN
INTRAMUSCULAR | Status: AC
  Filled 2024-01-03: qty 10

## 2024-01-03 MED ORDER — SODIUM CHLORIDE 0.9 % IV SOLN
80.0000 mg | INTRAVENOUS | Status: AC
  Administered 2024-01-03: 80 mg

## 2024-01-03 MED ORDER — CHLORHEXIDINE GLUCONATE 4 % EX SOLN
60.0000 mL | Freq: Once | CUTANEOUS | Status: AC
  Administered 2024-01-03: 4 via TOPICAL
  Filled 2024-01-03: qty 15

## 2024-01-03 MED ORDER — SODIUM CHLORIDE 0.9 % IV SOLN: INTRAVENOUS | Status: DC

## 2024-01-03 MED ORDER — MIDAZOLAM HCL 2 MG/2ML IJ SOLN
INTRAMUSCULAR | Status: AC
  Filled 2024-01-03: qty 2

## 2024-01-03 MED ORDER — OXYCODONE HCL 5 MG PO TABS
5.0000 mg | ORAL_TABLET | Freq: Four times a day (QID) | ORAL | Status: DC | PRN
  Administered 2024-01-03 – 2024-01-04 (×3): 5 mg via ORAL
  Filled 2024-01-03 (×3): qty 1

## 2024-01-03 MED ORDER — CEFAZOLIN SODIUM-DEXTROSE 2-4 GM/100ML-% IV SOLN
INTRAVENOUS | Status: AC
  Filled 2024-01-03: qty 100

## 2024-01-03 MED ORDER — LIDOCAINE HCL 1 % IJ SOLN
INTRAMUSCULAR | Status: AC
  Filled 2024-01-03: qty 60

## 2024-01-03 MED ORDER — FENTANYL CITRATE (PF) 250 MCG/5ML IJ SOLN
INTRAMUSCULAR | Status: DC | PRN
  Administered 2024-01-03 (×4): 25 ug via INTRAVENOUS

## 2024-01-03 MED ORDER — CHLORHEXIDINE GLUCONATE 4 % EX SOLN
60.0000 mL | Freq: Once | CUTANEOUS | Status: AC
  Filled 2024-01-03: qty 45

## 2024-01-03 MED ORDER — ONDANSETRON HCL 4 MG/2ML IJ SOLN: 4.0000 mg | Freq: Four times a day (QID) | INTRAMUSCULAR | Status: DC | PRN

## 2024-01-03 MED ORDER — APIXABAN 5 MG PO TABS
5.0000 mg | ORAL_TABLET | Freq: Two times a day (BID) | ORAL | Status: DC
  Administered 2024-01-03 – 2024-01-04 (×3): 5 mg via ORAL
  Filled 2024-01-03 (×3): qty 1

## 2024-01-03 MED ORDER — LIDOCAINE HCL (PF) 1 % IJ SOLN
INTRAMUSCULAR | Status: DC | PRN
  Administered 2024-01-03: 45 mL via INTRADERMAL

## 2024-01-03 MED ORDER — MIDAZOLAM HCL (PF) 2 MG/2ML IJ SOLN
INTRAMUSCULAR | Status: DC | PRN
  Administered 2024-01-03 (×2): 1 mg via INTRAVENOUS

## 2024-01-03 MED ORDER — SODIUM CHLORIDE 0.9 % IV SOLN
INTRAVENOUS | Status: AC
  Filled 2024-01-03: qty 2

## 2024-01-03 MED ORDER — FENTANYL CITRATE (PF) 100 MCG/2ML IJ SOLN
INTRAMUSCULAR | Status: AC
  Filled 2024-01-03: qty 2

## 2024-01-03 NOTE — Anesthesia Preprocedure Evaluation (Addendum)
 Anesthesia Evaluation  Patient identified by MRN, date of birth, ID band Patient awake    Reviewed: Allergy & Precautions, NPO status , Patient's Chart, lab work & pertinent test results  History of Anesthesia Complications Negative for: history of anesthetic complications  Airway Mallampati: Unable to assess  TM Distance: >3 FB Neck ROM: Limited    Dental  (+) Edentulous Upper, Edentulous Lower, Dental Advisory Given   Pulmonary former smoker, PE   Pulmonary exam normal breath sounds clear to auscultation       Cardiovascular hypertension, + CAD, + CABG (2019) and +CHF  + dysrhythmias (complete Heart block)  Rhythm:Irregular Rate:Abnormal     Neuro/Psych CVA, No Residual Symptoms    GI/Hepatic ,GERD  ,,  Endo/Other  diabetes, Type 2Hypothyroidism    Renal/GU Renal diseaseLab Results      Component                Value               Date                           K                        4.4                 01/03/2024                CO2                      25                  01/03/2024                BUN                      18                  01/03/2024                CREATININE               1.30 (H)            01/03/2024                     Musculoskeletal  (+) Arthritis ,    Abdominal   Peds  Hematology  (+) Blood dyscrasia On Eliquis  last dose 11/11 Lab Results      Component                Value               Date                      WBC                      5.6                 01/03/2024                HGB                      12.3 (L)            01/03/2024  HCT                      36.8 (L)            01/03/2024                MCV                      94.4                01/03/2024                PLT                      129 (L)             01/03/2024              Anesthesia Other Findings   Reproductive/Obstetrics                              Anesthesia  Physical Anesthesia Plan  ASA: 3  Anesthesia Plan: MAC   Post-op Pain Management: Minimal or no pain anticipated   Induction: Intravenous  PONV Risk Score and Plan: 3 and Treatment may vary due to age or medical condition, Propofol  infusion and Ondansetron   Airway Management Planned: Nasal Cannula and Natural Airway  Additional Equipment: None  Intra-op Plan:   Post-operative Plan:   Informed Consent: I have reviewed the patients History and Physical, chart, labs and discussed the procedure including the risks, benefits and alternatives for the proposed anesthesia with the patient or authorized representative who has indicated his/her understanding and acceptance.     Dental advisory given  Plan Discussed with: CRNA and Surgeon  Anesthesia Plan Comments:          Anesthesia Quick Evaluation

## 2024-01-03 NOTE — Anesthesia Postprocedure Evaluation (Signed)
 Anesthesia Post Note  Patient: Cory Hess  Procedure(s) Performed: PACEMAKER IMPLANT     Patient location during evaluation: Cath Lab Anesthesia Type: MAC Level of consciousness: awake and alert Pain management: pain level controlled Vital Signs Assessment: post-procedure vital signs reviewed and stable Respiratory status: spontaneous breathing, nonlabored ventilation, respiratory function stable and patient connected to nasal cannula oxygen Cardiovascular status: stable and blood pressure returned to baseline Postop Assessment: no apparent nausea or vomiting Anesthetic complications: no   There were no known notable events for this encounter.  Last Vitals:  Vitals:   01/03/24 1555 01/03/24 1958  BP: (!) 153/92 112/70  Pulse: 97 86  Resp: (P) 14 18  Temp: (P) 36.8 C 37.1 C  SpO2: 95% 94%    Last Pain:  Vitals:   01/03/24 1958  TempSrc: Oral  PainSc:    Pain Goal: Patients Stated Pain Goal: 0 (01/02/24 2253)                 Cory Hess

## 2024-01-03 NOTE — Discharge Instructions (Addendum)
 After Your Pacemaker   You have a Environmental Education Officer  If you have a Medtronic or Biotronik device, plug in your home monitor once you get home, and no manual interaction is required.   If you have an [employer] or [employer] device, plug your home monitor once you get home, sit near the device, and press the large activation button. Sit nearby until the process is complete, usually notated by lights on the monitor.   If you were set up for monitoring using an app on your phone, make sure the app remains open in the background and the Bluetooth remains on.  ACTIVITY Do not lift your arm above shoulder height for 1 week after your procedure. After 7 days, you may progress as below.  You should remove your sling 24 hours after your procedure, unless otherwise instructed by your provider.     Tuesday January 10, 2024  Wednesday January 11, 2024 Thursday January 12, 2024 Friday January 13, 2024   Do not lift, push, pull, or carry anything over 10 pounds with the affected arm until 6 weeks (Tuesday February 14, 2024 ) after your procedure.   You may drive AFTER your wound check, unless you have been told otherwise by your provider.   Ask your healthcare provider when you can go back to work   INCISION/Dressing Continue Eliquis   If large square, outer bandage is left in place, this can be removed after 24 hours from your procedure. Do not remove steri-strips or glue as below.   If a PRESSURE DRESSING (a bulky dressing that usually goes up over your shoulder) was applied or left in place, please follow instructions given by your provider on when to return to have this removed.   Monitor your Pacemaker site for redness, swelling, and drainage. Call the device clinic at [phone] if you experience these symptoms or fever/chills.  If your incision is sealed with Steri-strips or staples, you may shower 7 days after your procedure or when told by your provider. Do not remove  the steri-strips or let the shower hit directly on your site. You may wash around your site with soap and water .    If you were discharged in a sling, please do not wear this during the day more than 48 hours after your surgery unless otherwise instructed. This may increase the risk of stiffness and soreness in your shoulder.   Avoid lotions, ointments, or perfumes over your incision until it is well-healed.  You may use a hot tub or a pool AFTER your wound check appointment if the incision is completely closed.  Pacemaker Alerts:  Some alerts are vibratory and others beep. These are NOT emergencies. Please call our office to let us  know. If this occurs at night or on weekends, it can wait until the next business day. Send a remote transmission.  If your device is capable of reading fluid status (for heart failure), you will be offered monthly monitoring to review this with you.   DEVICE MANAGEMENT Remote monitoring is used to monitor your pacemaker from home. This monitoring is scheduled every 91 days by our office. It allows us  to keep an eye on the functioning of your device to ensure it is working properly. You will routinely see your Electrophysiologist annually (more often if necessary).  This will appear as a REMOTE check on your MyChart schedule. These are automatic and there is nothing for you to manually do unless otherwise instructed.  You should receive your ID  card for your new device in 4-8 weeks. Keep this card with you at all times once received. Consider wearing a medical alert bracelet or necklace.  Your Pacemaker may be MRI compatible. This will be discussed at your next office visit/wound check.  You should avoid contact with strong electric or magnetic fields.   Do not use amateur (ham) radio equipment or electric (arc) welding torches. MP3 player headphones with magnets should not be used. Some devices are safe to use if held at least 12 inches (30 cm) from your Pacemaker.  These include power tools, lawn mowers, and speakers. If you are unsure if something is safe to use, ask your health care provider.  When using your cell phone, hold it to the ear that is on the opposite side from the Pacemaker. Do not leave your cell phone in a pocket over the Pacemaker.  You may safely use electric blankets, heating pads, computers, and microwave ovens.  Call the office right away if: You have chest pain. You feel more short of breath than you have felt before. You feel more light-headed than you have felt before. Your incision starts to open up.  This information is not intended to replace advice given to you by your health care provider. Make sure you discuss any questions you have with your health care provider.

## 2024-01-03 NOTE — Progress Notes (Signed)
 [facility] PROGRESS NOTE    Progress Note  Cory Hess  FMW:[id number] DOB: 1945/07/09 DOA: 01/02/2024 PCP: Towana Small, FNP     Brief Narrative:   Cory Hess is an 78 y.o. male past medical history of diabetes mellitus type 2, essential hypertension, coronary artery disease, CAD status post CABG, complete heart block with trifascicular block, HFpEF, carcinoma of the tongue s/p radiation, PE on Eliquis  and a history of heart cardiac arrest earlier in the year, recently discharged from [facility] on 12/17/2023 found to be a complete heart block during that time he was in the ICU beta-blockers were stopped temporarily paced, they were able to take him off the patient without a permanent pacemaker comes in with syncope and tachybradycardia syndrome.   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 Continue to avoid AV nodal blocking agents.  Hold losartan . Currently n.p.o. for for pacer placement   Essential hypertension: Allow permissive hypertension, hold losartan .  Hyperlipidemia continue statins.  Carotid artery disease/CAD: Continue statins, aspirin  and Eliquis .  History of PE/cardiac arrest: Continue Eliquis .  HFpEF: Not on a diuretic appears euvolemic.  History of tongue cancer: Status post chemotherapy and radiation follow-up with oncology.  Chronic pain: Continue oxycodone    DVT prophylaxis: Eliquis  Family Communication:none Status is: Inpatient Remains inpatient appropriate because: Tachybradycardia syndrome    Code Status:     Code Status Orders  (From admission, onward)           Start     Ordered   01/02/24 1615  Full code  Continuous       Question:  By:  Answer:  Consent: discussion documented in EHR   01/02/24 1616           Code Status History     Date  Active Date Inactive Code Status Order ID Comments User Context   12/31/2023 1613 01/02/2024 1539 Full Code [id number]  Dorinda Drue DASEN, MD ED   12/09/2023 1811 12/13/2023 2053 Limited: Do not attempt resuscitation (DNR) -DNR-LIMITED -Do Not Intubate/DNI  [id number]  Cox, Amy N, DO ED   04/21/2023 1255 04/21/2023 2312 Do not attempt resuscitation (DNR) PRE-ARREST INTERVENTIONS DESIRED [id number]  Parris Manna, MD Inpatient   04/11/2023 1435 04/21/2023 1255 Do not attempt resuscitation (DNR) PRE-ARREST INTERVENTIONS DESIRED [id number]  Isaiah Scrivener, MD Inpatient   04/05/2023 1708 04/11/2023 1435 Full Code [id number]  Mady Bruckner, MD Inpatient   10/15/2022 1208 10/16/2022 0518 Full Code [id number]  Gar Felton PARAS, MD HOV   10/15/2022 1205 10/15/2022 1208 Full Code [id number]  Gar Felton PARAS, MD HOV   11/06/2018 0153 11/07/2018 1515 Full Code [id number]  Mansy, Madison LABOR, MD ED   08/28/2017 0213 09/02/2017 2231 Full Code [id number]  Dwan Kyla HERO, PA-C Inpatient         IV Access:   Peripheral IV   Procedures and diagnostic studies:   No results found.   Medical Consultants:   None.   Subjective:    Cory Hess no complaints  Objective:    Vitals:   01/02/24 1611 01/02/24 2030 01/02/24 2341 01/03/24 0443  BP:  139/78 (!) 145/62 (!) 162/74  Pulse:  72 66 70  Resp:  18 18 18   Temp:  98.5 F (36.9 C) 98.5 F (36.9 C) 98.3 F (36.8 C)  TempSrc:  Oral Oral Oral  SpO2:  98% 98% 98%  Weight: 90.9 kg  Height: 6' (1.829 m)      SpO2: 98 %   Intake/Output Summary (Last 24 hours) at 01/03/2024 0718 Last data filed at 01/03/2024 0447 Gross per 24 hour  Intake --  Output 800 ml  Net -800 ml   Filed Weights   01/02/24 1611  Weight: 90.9 kg    Exam: General exam: In no acute distress. Respiratory system: Good air movement and clear to auscultation. Cardiovascular system: S1 & S2 heard, RRR. Gastrointestinal system: Abdomen is nondistended, soft and nontender.   Extremities: No pedal edema. Skin: No rashes, lesions or ulcers Psychiatry: Judgement and insight appear normal. Mood & affect appropriate.    Data Reviewed:    Labs: Basic Metabolic Panel: Recent Labs  Lab 12/31/23 1440 01/01/24 0423 01/02/24 0509 01/03/24 0428  NA 141 140 141 140  K 4.2 4.1 4.6 4.4  CL 103 107 105 105  CO2 27 24 26 25   GLUCOSE 101* 102* 83 98  BUN 20 19 18 18   CREATININE 1.42* 1.12 1.28* 1.30*  CALCIUM  9.1 8.9 8.8* 8.9  MG  --   --   --  1.9   GFR Estimated Creatinine Clearance: 52.2 mL/min (A) (by C-G formula based on SCr of 1.3 mg/dL (H)). Liver Function Tests: Recent Labs  Lab 12/31/23 1440 01/03/24 0428  AST 30 21  ALT 25 20  ALKPHOS 66 58  BILITOT 0.8 0.8  PROT 6.9 5.9*  ALBUMIN  4.1 3.2*   No results for input(s): LIPASE, AMYLASE in the last 168 hours. No results for input(s): AMMONIA in the last 168 hours. Coagulation profile Recent Labs  Lab 12/31/23 1440  INR 1.0   COVID-19 Labs  No results for input(s): DDIMER, FERRITIN, LDH, CRP in the last 72 hours.  Lab Results  Component Value Date   SARSCOV2NAA NEGATIVE 04/05/2023   SARSCOV2NAA POSITIVE (A) 08/07/2020   SARSCOV2NAA NEGATIVE 12/21/2018   SARSCOV2NAA NEGATIVE 11/06/2018    CBC: Recent Labs  Lab 12/31/23 1440 01/01/24 0423 01/02/24 0509 01/03/24 0428  WBC 7.1 6.6 5.9 5.6  NEUTROABS 5.7  --  3.9  --   HGB 12.2* 12.8* 12.1* 12.3*  HCT 38.1* 39.3 37.3* 36.8*  MCV 96.5 95.6 97.6 94.4  PLT 129* 153 125* 129*   Cardiac Enzymes: No results for input(s): CKTOTAL, CKMB, CKMBINDEX, TROPONINI in the last 168 hours. BNP (last 3 results) No results for input(s): PROBNP in the last 8760 hours. CBG: No results for input(s): GLUCAP in the last 168 hours. D-Dimer: No results for input(s): DDIMER in the last 72 hours. Hgb A1c: No results for input(s): HGBA1C in the last 72 hours. Lipid Profile: No results for input(s): CHOL, HDL,  LDLCALC, TRIG, CHOLHDL, LDLDIRECT in the last 72 hours. Thyroid  function studies: No results for input(s): TSH, T4TOTAL, T3FREE, THYROIDAB in the last 72 hours.  Invalid input(s): FREET3 Anemia work up: No results for input(s): VITAMINB12, FOLATE, FERRITIN, TIBC, IRON, RETICCTPCT in the last 72 hours. Sepsis Labs: Recent Labs  Lab 12/31/23 1440 01/01/24 0423 01/02/24 0509 01/03/24 0428  WBC 7.1 6.6 5.9 5.6   Microbiology Recent Results (from the past 240 hours)  Surgical pcr screen     Status: None   Collection Time: 01/02/24  6:57 PM   Specimen: Nasal Mucosa; Nasal Swab  Result Value Ref Range Status   MRSA, PCR NEGATIVE NEGATIVE Final   Staphylococcus aureus NEGATIVE NEGATIVE Final    Comment: (NOTE) The Xpert SA Assay (FDA approved for NASAL specimens in patients 22 years of  age and older), is one component of a comprehensive surveillance program. It is not intended to diagnose infection nor to guide or monitor treatment. Performed at [facility], [address] [place], [place] [address]      Medications:    apixaban   5 mg Oral BID   aspirin   81 mg Oral Daily   atorvastatin   80 mg Oral Daily   famotidine   20 mg Oral Daily   levothyroxine   100 mcg Oral Q0600   sodium chloride  flush  3 mL Intravenous Q12H   Continuous Infusions:    LOS: 1 day   Cory Hess  [facility]  01/03/2024, 7:18 AM

## 2024-01-03 NOTE — Consult Note (Signed)
 ELECTROPHYSIOLOGY CONSULT NOTE    Patient ID: Cory Hess MRN: [id number], DOB/AGE: [birth year] 78 y.o.  Admit date: 01/02/2024 Date of Consult: 01/03/2024  Primary Physician: Towana Small, FNP Primary Cardiologist: Lonni Hanson, MD  Electrophysiologist: Dr. Cindie   Referring Provider: Dr. Odell Castor  Patient Profile: Cory Hess is a 78 y.o. male with a history of tachy-brady syndrome, high degree AVB, CAD s/p CABG (2019), HTN, HLD, recent PE with PEA arrest s/p thrombectomy (03/2023), carotid artery stenosis, throat cancer s/p neck chemo/XRT, right sided chest port, hypothyroidism who is being seen today for the evaluation of tachy-brady syndrome, CHB at the request of Dr. Odell Castor.  HPI:  Cory Hess is a 78 y.o. male who presented to [facility] on 12/31/23 with dizziness and elevated HR.   He was recently hospitalized ~ 1 month ago for syncopal event requiring TVP while BB washed out.  His conduction system improved and did not require PPM implant at that time.    On 12/31/23, he was at [facility] and began experiencing dizziness.  His HR's were in the 150's on arrival to hospital.  Nodal blockers were felt to be contraindicated as he had recently required TVP.  He was evaluated by EP and felt candidate for PPM implantation due to tachy-brady.  Pt was transferred to [facility] for PPM.    He denies chest pain, palpitations, dyspnea, PND, orthopnea, nausea, vomiting, dizziness, syncope, edema, weight gain, or early satiety.   Labs Potassium4.4 (11/11 0428) Magnesium   1.9 (11/11 0428) Creatinine, ser  1.30* (11/11 0428) PLT  129* (11/11 0428) HGB  12.3* (11/11 0428) WBC 5.6 (11/11 0428) Troponin I (High Sensitivity)32* (11/08 1716).    Past Medical History:  Diagnosis Date   Abscess of right arm 03/2023   pt states had absess to right arm after iv was inserted. Pt states he had a wound vac for 2 weeks   Acute kidney injury 04/06/2023   Acute on chronic HFrEF  (heart failure with reduced ejection fraction) (HCC) 04/05/2023   Acute respiratory failure with hypoxia and hypercapnia (HCC) 04/10/2023   Alternating RBBB & LBBB    Ankle swelling 2024   Anticoagulated on apixaban     Aortic atherosclerosis    Arthritis of neck    Bilateral inguinal hernia    Bladder tumor    BPH (benign prostatic hyperplasia)    CAD (coronary artery disease)    a. 08/2017 STEMI/Cath: LM 80ost/d, LAD 100p, LCX 95 ost/p, RCA 60p, 41m, RPDA 80ost; b. 08/2017 CABG x 3: LIMA->LAD, VG->OM, VG->PDA; c. 03/2023 Cardiac Arrest/Cath: Occluded VG->RPDA, patent LIMA->LAD and VG->pLCX. No new native dzs->Med rx. EF 25-35%.   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   CVA (cerebral vascular accident) (HCC) 04/08/2023   a.) brain MRI 04/08/2023: 2 small acute infarctions affecting the cortical brain in the RIGHT frontal region.   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 08/2017 TEE: EF 30-35%, sept/ant/inf HK, apical AK. 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 12/2017 Echo: EF 45-50%, diff HK; c. 09/2022 Echo: EF 55-60%; d. 03/2023 Echo (in settng of PE): EF 40-45%, GrI DD (imaging poor); e. 03/2023 Echo w/ definity : EF 55-60%, nl RV fxn.   Hyperlipidemia LDL goal <70    Hypothyroidism    Ischemic cardiomyopathy    a. 08/2017 TEE: EF 30-35%; b. 12/2017 Echo: EF 45-50%; c. 09/2022 Echo: EF 55-60%.   Long-term use of aspirin  therapy     Nephrolithiasis    PEA (Pulseless electrical activity) (HCC) 04/05/2023   a.) witnessed arrest at [facility] and CPR initiated immediately --> ROSC achieved --> EMS transferred to [facility] with multiple episodes of PEA arrest en route (further CPR required) --> ROSC achieved in ED with further muliple epsides of recurrent PEA arrest lasting secs-mins --> TCP applied and patient taken to cath lab for [facility] and TVP placement   Port-A-Cath in place    Pulmonary embolism (HCC) 04/15/2023   a.) CTA chest 04/15/2023: extensive clot burden with an occlusive clot in the RIGHT upper lobe anterior and posterior segmental arteries, and a non occlusive thrombus in the RIGHT lower lobe main artery and superior and posterior basal segmental arteries --> required thrombectomy   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) LIMA-LAD, SVG-OM, SVG-PDA   Shingles 2018   Status post bilateral cataract extraction    STEMI (ST elevation myocardial infarction) (HCC) 08/27/2017   a.) LHC 08/27/2017: 80% o-dLM, 100% lPAD, 95% o-pLCx, 60% pRCA, 90% mRCA, 80% oRPDA-RPDA --> unable to cross lesions with wire --> IABP placed --> CVTS consulted and [facility]; b.) s/p 3v CABG 08/27/2017: LIMA-LAD, SVG-OM, SVG-RPDA   Syncope 12/31/2023   Thrombocytopenia    Type 2 diabetes mellitus without complications (HCC) 06/07/2023   Vertigo      Surgical History:  Past Surgical History:  Procedure Laterality Date   CARDIAC CATHETERIZATION     CAROTID PTA/STENT INTERVENTION Right 10/26/2023   Procedure: CAROTID PTA/STENT INTERVENTION;  Surgeon: Marea Selinda RAMAN, MD;  Location: [facility] INVASIVE CV LAB;  Service: Cardiovascular;  Laterality: Right;   CATARACT EXTRACTION W/PHACO Left 07/18/2018   Procedure: CATARACT EXTRACTION PHACO AND INTRAOCULAR LENS PLACEMENT (IOC)  LEFT;  Surgeon: Myrna Adine Anes, MD;  Location: [facility] SURGERY CN[facility];  Service: Ophthalmology;  Laterality: Left;   CATARACT EXTRACTION W/PHACO Right 08/14/2018    Procedure: CATARACT EXTRACTION PHACO AND INTRAOCULAR LENS PLACEMENT (IOC)  RIGHT;  Surgeon: Myrna Adine Anes, MD;  Location: [facility] SURGERY [facility];  Service: Ophthalmology;  Laterality: Right;   CORONARY ARTERY BYPASS GRAFT N/A 08/27/2017   Procedure: CORONARY ARTERY BYPASS GRAFTING (CABG) ON PUMP USING LEFT INTERNAL MAMMARY ARTERY AND LEFT GREATER SAPHENOUS VEIN VIA ENDOVEIN HARVEST;  Surgeon: Lucas Dorise POUR, MD;  Location: [facility] OR;  Service: Open Heart Surgery;  Laterality: N/A;   CORONARY/GRAFT ACUTE MI REVASCULARIZATION N/A 08/27/2017   Procedure: Coronary/Graft Acute MI Revascularization;  Surgeon: Darron Deatrice LABOR, MD;  Location: [facility] INVASIVE CV LAB;  Service: Cardiovascular;  Laterality: N/A;   CYSTOSCOPY W/ RETROGRADES Bilateral 12/25/2018   Procedure: CYSTOSCOPY WITH RETROGRADE PYELOGRAM;  Surgeon: Penne Knee, MD;  Location: [facility] ORS;  Service: Urology;  Laterality: Bilateral;   FRACTURE SURGERY Right 1958   arm and left wrist compound fracture, no metal   HERNIA REPAIR Right    inguinial   INGUINAL HERNIA REPAIR Left 12/05/2023  Procedure: REPAIR, HERNIA, INGUINAL, ADULT;  Surgeon: Marinda Jayson KIDD, MD;  Location: [facility] ORS;  Service: General;  Laterality: Left;  open procedure with mesh   INSERTION OF MESH  12/05/2023   Procedure: INSERTION OF MESH;  Surgeon: Marinda Jayson KIDD, MD;  Location: [facility] ORS;  Service: General;;   IR IMAGING GUIDED PORT INSERTION  10/15/2022   LEFT HEART CATH AND CORONARY ANGIOGRAPHY N/A 08/27/2017   Procedure: LEFT HEART CATH AND CORONARY ANGIOGRAPHY;  Surgeon: Darron Deatrice LABOR, MD;  Location: [facility] INVASIVE CV LAB;  Service: Cardiovascular;  Laterality: N/A;   PULMONARY THROMBECTOMY Bilateral 04/18/2023   Procedure: PULMONARY THROMBECTOMY;  Surgeon: Marea Selinda RAMAN, MD;  Location: [facility] INVASIVE CV LAB;  Service: Cardiovascular;  Laterality: Bilateral;   RIGHT/LEFT HEART CATH AND CORONARY/GRAFT ANGIOGRAPHY N/A 04/05/2023   Procedure: RIGHT/LEFT HEART CATH  AND CORONARY/GRAFT ANGIOGRAPHY;  Surgeon: Mady Bruckner, MD;  Location: [facility] INVASIVE CV LAB;  Service: Cardiovascular;  Laterality: N/A;   TEMPORARY PACEMAKER N/A 04/05/2023   Procedure: TEMPORARY PACEMAKER;  Surgeon: Mady Bruckner, MD;  Location: [facility] INVASIVE CV LAB;  Service: Cardiovascular;  Laterality: N/A;   TEMPORARY PACEMAKER N/A 12/10/2023   Procedure: TEMPORARY PACEMAKER;  Surgeon: Florencio Cara BIRCH, MD;  Location: [facility] INVASIVE CV LAB;  Service: Cardiovascular;  Laterality: N/A;   TRANSURETHRAL RESECTION OF BLADDER TUMOR WITH MITOMYCIN -C N/A 12/25/2018   Procedure: TRANSURETHRAL RESECTION OF BLADDER TUMOR WITH Gemcitabine ;  Surgeon: Penne Knee, MD;  Location: [facility] ORS;  Service: Urology;  Laterality: N/A;     Medications Prior to Admission  Medication Sig Dispense Refill Last Dose/Taking   acetaminophen  (TYLENOL ) 500 MG tablet Take 1,000 mg by mouth in the morning and at bedtime.   Past Week   apixaban  (ELIQUIS ) 5 MG TABS tablet Take 1 tablet (5 mg total) by mouth 2 (two) times daily.   Past Week   aspirin  81 MG chewable tablet Place 1 tablet (81 mg total) into feeding tube daily.   Past Week   atorvastatin  (LIPITOR ) 80 MG tablet Place 1 tablet (80 mg total) into feeding tube daily. (Patient taking differently: Take 80 mg by mouth daily.) 90 tablet 3 Past Week   famotidine  (PEPCID ) 20 MG tablet Place 1 tablet (20 mg total) into feeding tube daily. (Patient taking differently: Take 20 mg by mouth daily.) 90 tablet 1 Past Week   levothyroxine  (SYNTHROID ) 100 MCG tablet Take 1 tablet (100 mcg total) by mouth daily before breakfast. 60 tablet 1 Past Week   Multiple Vitamin (MULTIVITAMIN WITH MINERALS) TABS tablet Take 1 tablet by mouth daily.   Past Week   oxyCODONE  (ROXICODONE ) 5 MG immediate release tablet Take 1 tablet (5 mg total) by mouth every 8 (eight) hours as needed. 20 tablet 0 Unknown   Blood Pressure Monitoring (BLOOD PRESSURE KIT) KIT 1 each by Does not apply route  in the morning, at noon, and at bedtime. 1 kit 0    losartan  (COZAAR ) 25 MG tablet Take 1 tablet (25 mg total) by mouth daily. (Patient not taking: No sig reported) 90 tablet 3 Not Taking    Inpatient Medications:   apixaban   5 mg Oral BID   aspirin   81 mg Oral Daily   atorvastatin   80 mg Oral Daily   chlorhexidine   60 mL Topical Once   famotidine   20 mg Oral Daily   gentamicin (GARAMYCIN) 80 mg in sodium chloride  0.9 % 500 mL irrigation  80 mg Irrigation On Call   levothyroxine   100 mcg Oral Q0600  sodium chloride  flush  3 mL Intravenous Q12H    Allergies: No Known Allergies  Family History  Problem Relation Age of Onset   Heart failure Mother    Lung disease Father    Stroke Father    Cervical cancer Maternal Grandmother      Physical Exam: Vitals:   01/02/24 2030 01/02/24 2341 01/03/24 0443 01/03/24 0743  BP: 139/78 (!) 145/62 (!) 162/74 (!) 153/93  Pulse: 72 66 70 73  Resp: 18 18 18 20   Temp: 98.5 F (36.9 C) 98.5 F (36.9 C) 98.3 F (36.8 C) 97.6 F (36.4 C)  TempSrc: Oral Oral Oral Oral  SpO2: 98% 98% 98% 98%  Weight:      Height:        GEN- NAD, A&O x 3, normal affect HEENT: Normocephalic, atraumatic Lungs- CTAB, Normal effort.  Heart- Regular rate and rhythm, No M/G/R.  GI- Soft, NT, ND.  Extremities- No clubbing, cyanosis, or edema   Radiology/Studies: DG Chest Portable 1 View Result Date: 12/31/2023 CLINICAL DATA:  Palpitations, syncopal episode. EXAM: PORTABLE CHEST 1 VIEW COMPARISON:  07/17/2023, 12/09/2023. FINDINGS: The heart size and mediastinal contours are within normal limits. Atherosclerotic calcification of the aorta is noted. No consolidation, effusion, or pneumothorax is seen. A right chest port is stable in position. Sternotomy wires are present over the midline. No acute osseous abnormality. IMPRESSION: No active disease. Electronically Signed   By: Leita Birmingham M.D.   On: 12/31/2023 15:14   ECHOCARDIOGRAM COMPLETE Result Date:  12/11/2023    ECHOCARDIOGRAM REPORT   Patient Name:   KASAI BELTRAN Date of Exam: 12/11/2023 Medical Rec #:  [id number]       Height:       72.0 in Accession #:    [id number]      Weight:       212.5 lb Date of Birth:  Apr 22, 1945       BSA:          2.186 m Patient Age:    78 years        BP:           115/56 mmHg Patient Gender: M               HR:           71 bpm. Exam Location:  [facility] Procedure: 2D Echo, Cardiac Doppler and Color Doppler (Both Spectral and Color            Flow Doppler were utilized during procedure). Indications:     Heart block, Complete I44.2  History:         Patient has prior history of Echocardiogram examinations. CAD.  Sonographer:     Bari Roar Referring Phys:  [id number] RYAN M DUNN Diagnosing Phys: Annabella Scarce MD IMPRESSIONS  1. Left ventricular ejection fraction, by estimation, is 55 to 60%. The left ventricle has normal function. The left ventricle has no regional wall motion abnormalities. There is moderate concentric left ventricular hypertrophy. Left ventricular diastolic parameters are consistent with Grade I diastolic dysfunction (impaired relaxation).  2. Right ventricular systolic function is normal. The right ventricular size is normal.  3. Left atrial size was mildly dilated.  4. The mitral valve is normal in structure. Trivial mitral valve regurgitation. No evidence of mitral stenosis.  5. The aortic valve is tricuspid. Aortic valve regurgitation is not visualized. No aortic stenosis is present.  6. The inferior vena cava is normal in size with greater than 50% respiratory  variability, suggesting right atrial pressure of 3 mmHg. FINDINGS  Left Ventricle: Left ventricular ejection fraction, by estimation, is 55 to 60%. The left ventricle has normal function. The left ventricle has no regional wall motion abnormalities. The left ventricular internal cavity size was normal in size. There is  moderate concentric left ventricular hypertrophy. Left ventricular diastolic  parameters are consistent with Grade I diastolic dysfunction (impaired relaxation). Indeterminate filling pressures. Right Ventricle: The right ventricular size is normal. No increase in right ventricular wall thickness. Right ventricular systolic function is normal. Left Atrium: Left atrial size was mildly dilated. Right Atrium: Right atrial size was normal in size. Pericardium: There is no evidence of pericardial effusion. Mitral Valve: The mitral valve is normal in structure. Trivial mitral valve regurgitation. No evidence of mitral valve stenosis. MV peak gradient, 5.7 mmHg. The mean mitral valve gradient is 2.0 mmHg. Tricuspid Valve: The tricuspid valve is normal in structure. Tricuspid valve regurgitation is trivial. No evidence of tricuspid stenosis. Aortic Valve: The aortic valve is tricuspid. Aortic valve regurgitation is not visualized. No aortic stenosis is present. Aortic valve mean gradient measures 2.0 mmHg. Aortic valve peak gradient measures 4.4 mmHg. Aortic valve area, by VTI measures 2.09 cm. Pulmonic Valve: The pulmonic valve was normal in structure. Pulmonic valve regurgitation is not visualized. No evidence of pulmonic stenosis. Aorta: The aortic root is normal in size and structure. Venous: The inferior vena cava is normal in size with greater than 50% respiratory variability, suggesting right atrial pressure of 3 mmHg. IAS/Shunts: No atrial level shunt detected by color flow Doppler.  LEFT VENTRICLE PLAX 2D LVIDd:         4.60 cm   Diastology LVIDs:         3.20 cm   LV e' medial:    7.62 cm/s LV PW:         1.20 cm   LV E/e' medial:  10.8 LV IVS:        1.40 cm   LV e' lateral:   11.40 cm/s LVOT diam:     1.90 cm   LV E/e' lateral: 7.2 LV SV:         41 LV SV Index:   19 LVOT Area:     2.84 cm  RIGHT VENTRICLE RV Basal diam:  3.60 cm RV Mid diam:    3.20 cm RV S prime:     10.40 cm/s TAPSE (M-mode): 2.1 cm LEFT ATRIUM             Index        RIGHT ATRIUM           Index LA diam:         4.30 cm 1.97 cm/m   RA Area:     20.20 cm LA Vol (A2C):   66.3 ml 30.33 ml/m  RA Volume:   56.70 ml  25.93 ml/m LA Vol (A4C):   67.0 ml 30.65 ml/m LA Biplane Vol: 69.3 ml 31.70 ml/m  AORTIC VALVE                    PULMONIC VALVE AV Area (Vmax):    2.09 cm     PV Vmax:        1.48 m/s AV Area (Vmean):   1.82 cm     PV Peak grad:   8.8 mmHg AV Area (VTI):     2.09 cm     RVOT Peak grad: 2 mmHg AV Vmax:  105.00 cm/s AV Vmean:          72.700 cm/s AV VTI:            0.194 m AV Peak Grad:      4.4 mmHg AV Mean Grad:      2.0 mmHg LVOT Vmax:         77.50 cm/s LVOT Vmean:        46.600 cm/s LVOT VTI:          0.143 m LVOT/AV VTI ratio: 0.74  AORTA Ao Root diam: 3.10 cm Ao Asc diam:  3.60 cm MITRAL VALVE                TRICUSPID VALVE MV Area (PHT): 4.44 cm     TR Peak grad:   21.7 mmHg MV Area VTI:   1.61 cm     TR Vmax:        233.00 cm/s MV Peak grad:  5.7 mmHg MV Mean grad:  2.0 mmHg     SHUNTS MV Vmax:       1.19 m/s     Systemic VTI:  0.14 m MV Vmean:      63.7 cm/s    Systemic Diam: 1.90 cm MV Decel Time: 171 msec MV E velocity: 82.00 cm/s MV A velocity: 120.00 cm/s MV E/A ratio:  0.68 MV A Prime:    17.3 cm/s Annabella Scarce MD Electronically signed by Annabella Scarce MD Signature Date/Time: 12/11/2023/10:51:20 AM    Final    CT ANGIO NECK W OR WO CONTRAST Result Date: 12/10/2023 EXAM: CTA Neck 12/10/2023 10:39:42 AM TECHNIQUE: CT of the neck was performed without and with the administration of 75 mL of iohexol  (OMNIPAQUE ) 350 MG/ML injection. Multiplanar 2D and/or 3D reformatted images are provided for review. Automated exposure control, iterative reconstruction, and/or weight based adjustment of the mA/kV was utilized to reduce the radiation dose to as low as reasonably achievable. Stenosis of the internal carotid arteries measured using NASCET criteria. COMPARISON: None available CLINICAL HISTORY: Syncope/presyncope, cerebrovascular cause suspected. FINDINGS: AORTIC ARCH AND ARCH  VESSELS: Mild atherosclerosis of the aortic arch. No dissection or arterial injury. No significant stenosis of the brachiocephalic or subclavian arteries. CERVICAL CAROTID ARTERIES: The right carotid artery is patent from the origin to the skull base. Mixed atherosclerotic plaque at the carotid bifurcation and right carotid bulb results in approximately 55% stenosis of the right ICA origin. There is an additional focal prominent region of noncalcified atherosclerotic plaque in the proximal right cervical ICA with a similar degree of narrowing. The left carotid artery is patent from the origin to the skull base. Atherosclerosis at the left carotid bifurcation without hemodynamically significant stenosis. No dissection or arterial injury. CERVICAL VERTEBRAL ARTERIES: Vertebral arteries are patent from the origins to the vertebrobasilar confluence. Atherosclerosis at the right vertebral artery origin results in moderate stenosis. There is additional mild stenosis at the left vertebral artery origin. Tortuosity of the left V1 segment. The intracranial vertebral arteries are patent. There is an atretic appearance of the post PICA segment of the left vertebral artery with significant distal tapering of the left V4 segment without evidence of occlusion. No dissection or arterial injury. LUNGS AND MEDIASTINUM: Right chest wall port-a-cath is partially visualized. Sequelae of median sternotomy. Lungs are unremarkable. SOFT TISSUES: Bilateral lens replacement. Mucosal thickening and mucous retention cyst in the left maxillary sinus. No acute abnormality. BONES: No acute abnormality. LIMITATIONS/ARTIFACTS AND INCIDENTAL INTRACRANIAL FINDINGS: Limited visualization of intracranial structures. There is a  small remote infarct noted within the superior right cerebellum. IMPRESSION: 1. Mixed atherosclerotic plaque at the right carotid bifurcation and bulb resulting in approximately 55% stenosis of the right ICA origin. 2. Moderate  stenosis at the right vertebral artery origin. 3. Additional atherosclerosis as above. 4. 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 Electronically signed by: Donnice Mania MD 12/10/2023 10:55 AM EDT RP Workstation: [id number]   CT Angio Abd/Pel W and/or Wo Contrast Result Date: 12/09/2023 CLINICAL DATA:  History of left inguinal hernia repair with hematoma. Suspicion for active bleeding. EXAM: CTA ABDOMEN AND PELVIS WITHOUT AND WITH CONTRAST TECHNIQUE: Multidetector CT imaging of the abdomen and pelvis was performed using the standard protocol during bolus administration of intravenous contrast. Multiplanar reconstructed images and MIPs were obtained and reviewed to evaluate the vascular anatomy. RADIATION DOSE REDUCTION: This exam was performed according to the departmental dose-optimization program which includes automated exposure control, adjustment of the mA and/or kV according to patient size and/or use of iterative reconstruction technique. CONTRAST:  OMNIPAQUE  IOHEXOL  350 MG/ML SOLN COMPARISON:  Chest CT scan earlier, same date. Prior abdominal CT scan and 07/17/2023 FINDINGS: VASCULAR Aorta: Stable moderate to advanced age related atherosclerotic calcifications but no aneurysm. Sizable ulcerated plaque or focal dissection noted involving the posterior aspect of the lower abdominal aorta on the left side measuring approximately 2.2 cm. Celiac: Atherosclerotic calcifications but no significant stenosis or dissection. SMA: Atherosclerotic calcifications but no significant stenosis. Renals: 2 renal arteries bilaterally. Two right and 3 left renal arteries no significant stenosis. Scattered atherosclerotic calcifications. IMA: Patent Inflow: Moderate atherosclerotic calcifications but no stenosis or dissection. Proximal Outflow: No significant findings. Scattered calcifications. Veins: No significant findings. Review of the MIP images confirms the above findings. NON-VASCULAR Lower chest:  No acute pulmonary findings. Pulmonary scarring changes. Hepatobiliary: No hepatic lesions or intrahepatic biliary dilatation. Gallbladder is unremarkable. No common bile duct dilatation. Pancreas: Unremarkable. No pancreatic ductal dilatation or surrounding inflammatory changes. Spleen: Normal in size without focal abnormality. Adrenals/Urinary Tract: Adrenal glands and kidneys are unremarkable. There is contrast in the renal collecting systems from prior CT scan. Bilateral renal cysts, unchanged. The bladder is unremarkable. Stomach/Bowel: The stomach, duodenum, small bowel and colon are grossly normal without oral contrast. No inflammatory changes, mass lesions or obstructive findings. The appendix is normal. Lymphatic: No abdominal or pelvic lymphadenopathy. Reproductive: Enlarged prostate gland. The seminal vesicles are unremarkable. Other: There is a sizable left groin hematoma measuring approximately 7.8 x 2.7 cm. This is anterior to the inguinal canal. No extravasation of contrast material to suggest active bleeding. Small tortuous vessels in the spermatic cords bilaterally. Musculoskeletal: No significant bony findings. IMPRESSION: 1. Sizable left groin hematoma but no extravasation of contrast material to suggest active bleeding. 2. Sizable ulcerated plaque or focal dissection involving the posterior aspect of the lower abdominal aorta on the left side measuring approximately 2.2 cm. 3. No acute abdominal/pelvic findings, mass lesions or adenopathy. 4. Enlarged prostate gland. 5. Aortic atherosclerosis. Aortic Atherosclerosis (ICD10-I70.0). Electronically Signed   By: MYRTIS Stammer M.D.   On: 12/09/2023 18:02   CT Angio Chest PE W and/or Wo Contrast Result Date: 12/09/2023 CLINICAL DATA:  Pulmonary embolism (PE) suspected, high prob. Near syncopal episode. History of tongue carcinoma. * Tracking Code: BO * EXAM: CT ANGIOGRAPHY CHEST WITH CONTRAST TECHNIQUE: Multidetector CT imaging of the chest was  performed using the standard protocol during bolus administration of intravenous contrast. Multiplanar CT image reconstructions and MIPs were obtained to evaluate the vascular anatomy.  RADIATION DOSE REDUCTION: This exam was performed according to the departmental dose-optimization program which includes automated exposure control, adjustment of the mA and/or kV according to patient size and/or use of iterative reconstruction technique. CONTRAST:  75mL OMNIPAQUE  IOHEXOL  350 MG/ML SOLN COMPARISON:  CT angiography chest from 07/17/2023. FINDINGS: Cardiovascular: No evidence of embolism to the proximal subsegmental pulmonary artery level. Mild cardiomegaly. No pericardial effusion. No aortic aneurysm. There are coronary artery calcifications, in keeping with coronary artery disease. There are also mild peripheral atherosclerotic vascular calcifications of thoracic aorta and its major branches. Mediastinum/Nodes: Visualized thyroid  gland appears grossly unremarkable. No solid / cystic mediastinal masses. The esophagus is nondistended precluding optimal assessment. No axillary, mediastinal or hilar lymphadenopathy by size criteria. Lungs/Pleura: The central tracheo-bronchial tree is patent. There is mild, smooth, circumferential thickening of the segmental and subsegmental bronchial walls, throughout bilateral lungs, which is nonspecific. Findings are most commonly seen with bronchitis or reactive airway disease, such as asthma. There are dependent changes in bilateral lungs. No mass or consolidation. No pleural effusion or pneumothorax. There is a new 5 x 6 mm ground-glass nodule in the right upper lobe (series 5, image 62). Upper Abdomen: Visualized upper abdominal viscera within normal limits. Musculoskeletal: The visualized soft tissues of the chest wall are grossly unremarkable. No suspicious osseous lesions. There are mild to moderate multilevel degenerative changes in the visualized spine. Review of the MIP images  confirms the above findings. IMPRESSION: 1. No embolism to the proximal subsegmental pulmonary artery level. No lung mass, consolidation, pleural effusion or pneumothorax. 2. There is a new 5 x 6 mm ground-glass nodule in the right upper lobe. Consider short-term follow-up examination in 3 months to document resolution versus stability. 3. Multiple other nonacute observations, as described above. Aortic Atherosclerosis (ICD10-I70.0). Electronically Signed   By: Ree Molt M.D.   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, RBBB, LAFB  (personally reviewed)  TELEMETRY: SR 60-80's with intermittent short bursts of tachycardia - AT vs AFL (personally reviewed)  DEVICE HISTORY: pending   Assessment/Plan:  Tachy-Brady Syndrome  Near Syncope  Hx CHB requiring TVP  Bifascicular Block  Hx Squamous Cell Carcinoma of Tongue  Recent PE on Eliquis    -NPO for PPM implantation  -plan for pressure dressing post implant given inability to stop OAC due to PE  -plan for anesthesia for case given inability to lie flat for case due to neck/back pain  -continue Eliquis  for PE  -pt understands need for procedure, reviewed risks / benefits and is agreeable for PPM implantation  CAD s/p CABG  -no anginal symptoms      For questions or updates, please contact [facility] Please consult [email or web address] for contact info under     Signed, Daphne Barrack, NP-C, AGACNP-BC [facility] - Electrophysiology  01/03/2024, 9:18 AM

## 2024-01-03 NOTE — Transfer of Care (Signed)
 Immediate Anesthesia Transfer of Care Note  Patient: Cory Hess  Procedure(s) Performed: PACEMAKER IMPLANT  Patient Location: PACU and Cath Lab  Anesthesia Type:MAC  Level of Consciousness: awake, alert , oriented, patient cooperative, and responds to stimulation  Airway & Oxygen Therapy: Patient Spontanous Breathing  Post-op Assessment: Report given to RN and Post -op Vital signs reviewed and stable  Post vital signs: Reviewed and stable  Last Vitals:  Vitals Value Taken Time  BP 142/81 01/03/24 15:00  Temp    Pulse 99 01/03/24 15:01  Resp 14 01/03/24 15:01  SpO2 95 % 01/03/24 15:01  Vitals shown include unfiled device data.  Last Pain:  Vitals:   01/03/24 1144  TempSrc: Oral  PainSc:       Patients Stated Pain Goal: 0 (01/02/24 2253)  Complications: There were no known notable events for this encounter.

## 2024-01-04 ENCOUNTER — Other Ambulatory Visit (HOSPITAL_COMMUNITY): Payer: Self-pay

## 2024-01-04 ENCOUNTER — Inpatient Hospital Stay (HOSPITAL_COMMUNITY)

## 2024-01-04 ENCOUNTER — Ambulatory Visit: Admitting: Nurse Practitioner

## 2024-01-04 ENCOUNTER — Encounter (HOSPITAL_COMMUNITY): Payer: Self-pay | Admitting: Cardiology

## 2024-01-04 DIAGNOSIS — Z452 Encounter for adjustment and management of vascular access device: Secondary | ICD-10-CM | POA: Diagnosis not present

## 2024-01-04 DIAGNOSIS — I700 Atherosclerosis of aorta: Secondary | ICD-10-CM | POA: Diagnosis not present

## 2024-01-04 DIAGNOSIS — I495 Sick sinus syndrome: Secondary | ICD-10-CM | POA: Diagnosis not present

## 2024-01-04 DIAGNOSIS — Z950 Presence of cardiac pacemaker: Secondary | ICD-10-CM | POA: Diagnosis not present

## 2024-01-04 MED ORDER — METOPROLOL SUCCINATE ER 25 MG PO TB24
25.0000 mg | ORAL_TABLET | Freq: Every day | ORAL | Status: DC
  Administered 2024-01-04: 25 mg via ORAL
  Filled 2024-01-04: qty 1

## 2024-01-04 MED ORDER — OXYCODONE HCL 5 MG PO TABS: 5.0000 mg | ORAL_TABLET | ORAL | Status: DC | PRN

## 2024-01-04 MED ORDER — NAPHAZOLINE-GLYCERIN 0.012-0.25 % OP SOLN: 1.0000 [drp] | Freq: Four times a day (QID) | OPHTHALMIC | 0 refills | Status: DC | PRN

## 2024-01-04 MED ORDER — NAPHAZOLINE-GLYCERIN 0.012-0.25 % OP SOLN
1.0000 [drp] | Freq: Four times a day (QID) | OPHTHALMIC | Status: DC | PRN
Start: 2024-01-04 — End: 2024-01-04

## 2024-01-04 MED ORDER — METOPROLOL SUCCINATE ER 25 MG PO TB24
25.0000 mg | ORAL_TABLET | Freq: Every day | ORAL | 3 refills | Status: DC
  Filled 2024-01-04: qty 30, 30d supply, fill #0

## 2024-01-04 MED FILL — Fentanyl Citrate Preservative Free (PF) Inj 100 MCG/2ML: INTRAMUSCULAR | Qty: 2 | Status: AC

## 2024-01-04 NOTE — Progress Notes (Signed)
 Patient returns today status post an open left inguinal hernia.  He was admitted to the hospital with tacky bradycardia syndrome and at that time was found to have a hematoma in the left groin.  CT scan reviewed and the hematoma looks above the mesh.  He is on Eliquis .  He reports that he has been doing okay since then.  He continues to have a large hematoma in the left groin but he says that it is getting a little bit smaller.  He does report there has been some drainage from the wound and describes it as old blood and small amounts of clot.  He says that he has not had this in several days.  On exam his left groin is bruised and he does have a sizable hematoma here.  On the dressing there is no drainage from the hematoma.  It is difficult to fully examine the inguinal canal but he does not have a obvious recurrence of the hernia.  Given his myriad of medical problems I think we should try to wait and allow the body to resorb this hematoma rather than evacuated surgically.  He is also largely asymptomatic from it.  We will plan to see him again in several weeks to check on the progress and see if it is any smaller.

## 2024-01-04 NOTE — Plan of Care (Signed)
  Problem: Education: Goal: Knowledge of General Education information will improve Description: Including pain rating scale, medication(s)/side effects and non-pharmacologic comfort measures Outcome: Progressing   Problem: Health Behavior/Discharge Planning: Goal: Ability to manage health-related needs will improve Outcome: Progressing   Problem: Clinical Measurements: Goal: Ability to maintain clinical measurements within normal limits will improve Outcome: Progressing Goal: Will remain free from infection Outcome: Progressing Goal: Diagnostic test results will improve Outcome: Progressing Goal: Respiratory complications will improve Outcome: Progressing Goal: Cardiovascular complication will be avoided Outcome: Progressing   Problem: Activity: Goal: Risk for activity intolerance will decrease Outcome: Progressing   Problem: Nutrition: Goal: Adequate nutrition will be maintained Outcome: Progressing   Problem: Coping: Goal: Level of anxiety will decrease Outcome: Progressing   Problem: Elimination: Goal: Will not experience complications related to bowel motility Outcome: Progressing Goal: Will not experience complications related to urinary retention Outcome: Progressing   Problem: Pain Managment: Goal: General experience of comfort will improve and/or be controlled Outcome: Progressing   Problem: Safety: Goal: Ability to remain free from injury will improve Outcome: Progressing   Problem: Skin Integrity: Goal: Risk for impaired skin integrity will decrease Outcome: Progressing   Problem: Education: Goal: Knowledge of General Education information will improve Description: Including pain rating scale, medication(s)/side effects and non-pharmacologic comfort measures Outcome: Progressing   Problem: Health Behavior/Discharge Planning: Goal: Ability to manage health-related needs will improve Outcome: Progressing   Problem: Clinical Measurements: Goal:  Ability to maintain clinical measurements within normal limits will improve Outcome: Progressing Goal: Will remain free from infection Outcome: Progressing Goal: Diagnostic test results will improve Outcome: Progressing Goal: Respiratory complications will improve Outcome: Progressing Goal: Cardiovascular complication will be avoided Outcome: Progressing   Problem: Activity: Goal: Risk for activity intolerance will decrease Outcome: Progressing   Problem: Nutrition: Goal: Adequate nutrition will be maintained Outcome: Progressing   Problem: Coping: Goal: Level of anxiety will decrease Outcome: Progressing   Problem: Elimination: Goal: Will not experience complications related to bowel motility Outcome: Progressing Goal: Will not experience complications related to urinary retention Outcome: Progressing   Problem: Pain Managment: Goal: General experience of comfort will improve and/or be controlled Outcome: Progressing   Problem: Safety: Goal: Ability to remain free from injury will improve Outcome: Progressing   Problem: Skin Integrity: Goal: Risk for impaired skin integrity will decrease Outcome: Progressing   Problem: Education: Goal: Knowledge of cardiac device and self-care will improve Outcome: Progressing Goal: Ability to safely manage health related needs after discharge will improve Outcome: Progressing Goal: Individualized Educational Video(s) Outcome: Progressing   Problem: Cardiac: Goal: Ability to achieve and maintain adequate cardiopulmonary perfusion will improve Outcome: Progressing

## 2024-01-04 NOTE — Plan of Care (Signed)
  Problem: Education: Goal: Knowledge of General Education information will improve Description: Including pain rating scale, medication(s)/side effects and non-pharmacologic comfort measures Outcome: Adequate for Discharge   Problem: Health Behavior/Discharge Planning: Goal: Ability to manage health-related needs will improve Outcome: Adequate for Discharge   Problem: Clinical Measurements: Goal: Ability to maintain clinical measurements within normal limits will improve Outcome: Adequate for Discharge Goal: Will remain free from infection Outcome: Adequate for Discharge Goal: Diagnostic test results will improve Outcome: Adequate for Discharge Goal: Respiratory complications will improve Outcome: Adequate for Discharge Goal: Cardiovascular complication will be avoided Outcome: Adequate for Discharge   Problem: Activity: Goal: Risk for activity intolerance will decrease Outcome: Adequate for Discharge   Problem: Nutrition: Goal: Adequate nutrition will be maintained Outcome: Adequate for Discharge   Problem: Coping: Goal: Level of anxiety will decrease Outcome: Adequate for Discharge   Problem: Elimination: Goal: Will not experience complications related to bowel motility Outcome: Adequate for Discharge Goal: Will not experience complications related to urinary retention Outcome: Adequate for Discharge   Problem: Pain Managment: Goal: General experience of comfort will improve and/or be controlled Outcome: Adequate for Discharge   Problem: Safety: Goal: Ability to remain free from injury will improve Outcome: Adequate for Discharge   Problem: Skin Integrity: Goal: Risk for impaired skin integrity will decrease Outcome: Adequate for Discharge   Problem: Education: Goal: Knowledge of General Education information will improve Description: Including pain rating scale, medication(s)/side effects and non-pharmacologic comfort measures Outcome: Adequate for  Discharge   Problem: Health Behavior/Discharge Planning: Goal: Ability to manage health-related needs will improve Outcome: Adequate for Discharge   Problem: Clinical Measurements: Goal: Ability to maintain clinical measurements within normal limits will improve Outcome: Adequate for Discharge Goal: Will remain free from infection Outcome: Adequate for Discharge Goal: Diagnostic test results will improve Outcome: Adequate for Discharge Goal: Respiratory complications will improve Outcome: Adequate for Discharge Goal: Cardiovascular complication will be avoided Outcome: Adequate for Discharge   Problem: Activity: Goal: Risk for activity intolerance will decrease Outcome: Adequate for Discharge   Problem: Nutrition: Goal: Adequate nutrition will be maintained Outcome: Adequate for Discharge   Problem: Coping: Goal: Level of anxiety will decrease Outcome: Adequate for Discharge   Problem: Elimination: Goal: Will not experience complications related to bowel motility Outcome: Adequate for Discharge Goal: Will not experience complications related to urinary retention Outcome: Adequate for Discharge   Problem: Pain Managment: Goal: General experience of comfort will improve and/or be controlled Outcome: Adequate for Discharge   Problem: Safety: Goal: Ability to remain free from injury will improve Outcome: Adequate for Discharge   Problem: Skin Integrity: Goal: Risk for impaired skin integrity will decrease Outcome: Adequate for Discharge   Problem: Education: Goal: Knowledge of cardiac device and self-care will improve Outcome: Adequate for Discharge Goal: Ability to safely manage health related needs after discharge will improve Outcome: Adequate for Discharge Goal: Individualized Educational Video(s) Outcome: Adequate for Discharge   Problem: Cardiac: Goal: Ability to achieve and maintain adequate cardiopulmonary perfusion will improve Outcome: Adequate for  Discharge

## 2024-01-04 NOTE — Progress Notes (Signed)
 TOC meds and BSC3:1 at bedside.  Kindly refused walker.  States, I have a new one at home  RN will discharge.  Patient has fluctuations in BP and HRT.   Randine RN will discharge on the unit.  Med details completed.   Discharge lounge updated.

## 2024-01-04 NOTE — Evaluation (Signed)
 Physical Therapy Evaluation Patient Details Name: Cory Hess MRN: [id number] DOB: 07/15/1945 Today's Date: 01/04/2024  History of Present Illness  The pt is a 78 yo male presenting 11/8 with dizziness. Work up revealed tachy-brady syndrome and Bifascicular block. S/p PPM placement 11/11. PMH includes: arthritis, CAD, STEMI, CKD [name], CHB, CVA, HLD, hypothyroidism, PE, PEA, and vertigo.   Clinical Impression  Pt in bed upon arrival of PT, agreeable to evaluation at this time. Prior to admission the pt was independent without use of DME, reports living alone and complete independent with all mobility, ADLs, and IADLs. The pt does report intermittent assist available from family, but is hopeful to return home alone eventually. Pt able to complete bed mobility and transfers without need for assistance or UE support, manage LUE NWB well. CGA for hallway ambulation initially, but progressed to supervision even with balance challenge, pt still encouraged to use cane after d/c for added safety. Pt needs assist from family for management of stairs, minA to slow momentum when descending, and pt encouraged to sit for rest after completing stairs. Pt educated generally in slowed movements, increased time during transitions due to drop in BP, and increased res breaks due to elevated HR with stairs and ambulation. Pt verbalized understanding, would not like follow up therapies at this time.   VITALS:  - sitting EOB - BP: 158/83 (103); HR: 87bpm - standing after gait - BP: 120/91 (99); HR: 122bpm - sitting end of session - BP: 131/93 (107); HR: 102bpm   Max HR on stairs: 133bpm    If plan is discharge home, recommend the following: A little help with walking and/or transfers;Help with stairs or ramp for entrance;Assist for transportation;Assistance with cooking/housework   Can travel by private vehicle        Equipment Recommendations BSC/3in1  Recommendations for Other Services       Functional  Status Assessment Patient has had a recent decline in their functional status and demonstrates the ability to make significant improvements in function in a reasonable and predictable amount of time.     Precautions / Restrictions Precautions Precautions: ICD/Pacemaker Recall of Precautions/Restrictions: Intact Restrictions Weight Bearing Restrictions Per Provider Order: Yes LUE Weight Bearing Per Provider Order: Non weight bearing Other Position/Activity Restrictions: NWB and limited ROM post-pacemaker      Mobility  Bed Mobility Overal bed mobility: Needs Assistance Bed Mobility: Supine to Sit, Sit to Supine     Supine to sit: Supervision, HOB elevated, Used rails Sit to supine: Supervision, Used rails, HOB elevated   General bed mobility comments: use of bed features, no assist. good adherence to not using LUE    Transfers Overall transfer level: Needs assistance Equipment used: None Transfers: Sit to/from Stand Sit to Stand: Supervision           General transfer comment: no UE support, good stability    Ambulation/Gait Ambulation/Gait assistance: Contact guard assist, Supervision Gait Distance (Feet): 150 Feet Assistive device: None Gait Pattern/deviations: Decreased stride length, Wide base of support, Drifts right/left Gait velocity: decreased     General Gait Details: pt with mild drifting, slowed pace but reports gait as normal for him  Stairs Stairs: Yes Stairs assistance: Contact guard assist, Min assist Stair Management: One rail Right, Alternating pattern, Forwards Number of Stairs: 11 General stair comments: alternating ascending with CGA, step-to descending with minA     Balance Overall balance assessment: Needs assistance Sitting-balance support: No upper extremity supported, Feet supported Sitting balance-Leahy Scale: Good  Standing balance support: No upper extremity supported, During functional activity Standing balance-Leahy Scale:  Good                               Pertinent Vitals/Pain Pain Assessment Pain Assessment: 0-10 Pain Score: 8  Pain Location: 8/10 at PPM insertion site, 6/10 at L groin hernia Pain Descriptors / Indicators: Sharp Pain Intervention(s): Monitored during session, Limited activity within patient's tolerance, Repositioned    Home Living Family/patient expects to be discharged to:: Private residence Living Arrangements: Alone Available Help at Discharge: Family;Available PRN/intermittently Type of Home: House Home Access: Stairs to enter Entrance Stairs-Rails: Right;Left;Can reach both Entrance Stairs-Number of Steps: 5 at front, 3 at back   Home Layout: One level Home Equipment: Grab bars - tub/shower;Rolling Walker (2 wheels);Rollator (4 wheels)      Prior Function Prior Level of Function : Driving;Independent/Modified Independent             Mobility Comments: no falls, no use of DME at baseline, reports walking in stores for activity, normally up walking around house ADLs Comments: independent     Extremity/Trunk Assessment   Upper Extremity Assessment Upper Extremity Assessment: LUE deficits/detail LUE Deficits / Details: limited due to PPM LUE Sensation: WNL LUE Coordination: WNL    Lower Extremity Assessment Lower Extremity Assessment: Overall WFL for tasks assessed    Cervical / Trunk Assessment Cervical / Trunk Assessment: Normal  Communication   Communication Communication: No apparent difficulties    Cognition Arousal: Alert Behavior During Therapy: WFL for tasks assessed/performed   PT - Cognitive impairments: No apparent impairments                         Following commands: Intact       Cueing Cueing Techniques: Verbal cues     General Comments General comments (skin integrity, edema, etc.): BP from 158/83 in sitting to 120/91 standing after ambulation, HR max 133 and maintained >120 until back in bed. reports no  sx    Exercises     Assessment/Plan    PT Assessment Patient needs continued PT services  PT Problem List Decreased activity tolerance;Decreased balance;Decreased mobility       PT Treatment Interventions DME instruction;Gait training;Stair training;Functional mobility training;Therapeutic activities;Therapeutic exercise;Balance training;Patient/family education    PT Goals (Current goals can be found in the Care Plan section)  Acute Rehab PT Goals Patient Stated Goal: return home PT Goal Formulation: With patient Time For Goal Achievement: 01/18/24 Potential to Achieve Goals: Good    Frequency Min 2X/week        AM-PAC PT 6 Clicks Mobility  Outcome Measure Help needed turning from your back to your side while in a flat bed without using bedrails?: None Help needed moving from lying on your back to sitting on the side of a flat bed without using bedrails?: None Help needed moving to and from a bed to a chair (including a wheelchair)?: A Little Help needed standing up from a chair using your arms (e.g., wheelchair or bedside chair)?: A Little Help needed to walk in hospital room?: A Little Help needed climbing 3-5 steps with a railing? : A Little 6 Click Score: 20    End of Session Equipment Utilized During Treatment: Gait belt Activity Tolerance: Patient tolerated treatment well Patient left: in bed;with call bell/phone within reach Nurse Communication: Mobility status;Other (comment) (drop in BP) PT Visit Diagnosis: Unsteadiness  on feet (R26.81);Other abnormalities of gait and mobility (R26.89)    Time: 1336-1410 PT Time Calculation (min) (ACUTE ONLY): 34 min   Charges:   PT Evaluation $PT Eval Moderate Complexity: 1 Mod PT Treatments $Therapeutic Exercise: 8-22 mins PT General Charges $$ ACUTE PT VISIT: 1 Visit         Izetta Call, PT, DPT   Acute Rehabilitation Department Office [phone] Secure Chat Communication Preferred  Izetta JULIANNA Call 01/04/2024, 2:42 PM

## 2024-01-04 NOTE — TOC Initial Note (Signed)
 Transition of Care [facility]) - Initial/Assessment Note    Patient Details  Name: Cory Hess MRN: [id number] Date of Birth: 1946/01/04  Transition of Care [facility]) CM/SW Contact:    Sudie Erminio Deems, RN Phone Number: 01/04/2024, 3:32 PM  Clinical Narrative:  Patient presented for tachy brady syndrome-post pacemaker. Plan for home with [facility] RN and Aide. PTA patient was from home alone. Patient states he will have support of his sister and that she will take him to all of his appointments. Patient has used [facility] in the past and wants to use them again. Referral submitted via the hub and the agency will see the patient within 24-48 hours post discharge home. Patient in need of DME bedside commode and referral was submitted to [facility]- no agency preference. DME to be delivered to the room before discharge home. No further needs identified at this time. Sister will transport home.    Expected Discharge Plan: Home w Home Health Services Barriers to Discharge: No Barriers Identified   Patient Goals and CMS Choice Patient states their goals for this hospitalization and ongoing recovery are:: Plan to return home with home health   Choice offered to / list presented to : Patient (He was currently active with [facility] and wants to use them again.)      Expected Discharge Plan and Services   Discharge Planning Services: CM Consult Post Acute Care Choice: Home Health Living arrangements for the past 2 months: Single Family Home Expected Discharge Date: 01/04/24                 DME Agency: NA       HH Arranged: RN, Disease Management, Nurse's Aide HH Agency: [facility] Date [facility] Agency Contacted: 01/04/24 Time HH Agency Contacted: [phone] Representative spoke with at [facility] Agency: hub  Prior Living Arrangements/Services Living arrangements for the past 2 months: Single Family Home Lives with:: Self Patient language and need for interpreter reviewed::  Yes Do you feel safe going back to the place where you live?: Yes      Need for Family Participation in Patient Care: Yes (Comment) Care giver support system in place?: Yes (comment) Current home services: DME Criminal Activity/Legal Involvement Pertinent to Current Situation/Hospitalization: No - Comment as needed  Activities of Daily Living   ADL Screening (condition at time of admission) Independently performs ADLs?: Yes (appropriate for developmental age) Is the patient deaf or have difficulty hearing?: No Does the patient have difficulty seeing, even when wearing glasses/contacts?: No Does the patient have difficulty concentrating, remembering, or making decisions?: No  Permission Sought/Granted Permission sought to share information with : Family Supports, Magazine Features Editor, Case Estate Manager/land Agent granted to share information with : Yes, Verbal Permission Granted     Permission granted to share info w AGENCY: [facility]        Emotional Assessment Appearance:: Appears stated age Attitude/Demeanor/Rapport: Engaged Affect (typically observed): Appropriate Orientation: : Oriented to Place, Oriented to  Time, Oriented to Situation Alcohol / Substance Use: Not Applicable Psych Involvement: No (comment)  Admission diagnosis:  Heart block [I45.9] 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] Patient Active Problem List   Diagnosis Date Noted   Tachy-brady syndrome (HCC) 01/02/2024   Chronic diastolic CHF (congestive heart failure) (HCC) 01/02/2024   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   History of cardiac arrest 12/10/2023   Acute blood loss anemia 12/09/2023   Near syncope 12/09/2023   Carotid stenosis 10/18/2023   Open arm wound, right, sequela 08/19/2023  Abscess of right arm 08/02/2023   Left inguinal hernia 08/02/2023   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   Acquired hypothyroidism 08/02/2023   Essential hypertension    Blood glucose elevated  07/06/2023   Does not have primary care provider 07/01/2023   Thrush 07/01/2023   Former smoker 07/01/2023   Benign prostatic hyperplasia without lower urinary tract symptoms 06/07/2023   Gastro-esophageal reflux disease without esophagitis 06/07/2023   History of bladder cancer 06/07/2023   Hyperlipidemia, unspecified 06/07/2023   History of pulmonary embolus (PE) 04/15/2023   History of CVA (cerebrovascular accident) 04/09/2023   Hyperkalemia 04/06/2023   CHB (complete heart block) (HCC) 04/05/2023   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   Hematuria 11/06/2018   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   Coronary artery disease 08/28/2017   PCP:  Towana Small, FNP Pharmacy:   [facility] [address] [place] - [address] [place] [address] Phone: [phone] Fax: [phone]     Social Drivers of Health (SDOH) Social History: SDOH Screenings   Food Insecurity: No Food Insecurity (01/02/2024)  Housing: Low Risk  (01/02/2024)  Transportation Needs: No Transportation Needs (01/02/2024)  Utilities: Not At Risk (01/02/2024)  Depression (PHQ2-9): Low Risk  (09/13/2023)  Financial Resource Strain: Low Risk  (11/06/2018)  Physical Activity: Inactive (11/06/2018)  Social Connections: Moderately Isolated (01/02/2024)  Stress: No Stress Concern Present (11/06/2018)  Tobacco Use: Medium Risk (01/03/2024)   SDOH Interventions:     Readmission Risk Interventions    01/02/2024   10:24 AM 12/12/2023   11:24 AM  Readmission Risk Prevention Plan  Transportation Screening Complete Complete  PCP or Specialist Appt within 3-5 Days Complete Complete  Social Work Consult for Recovery Care Planning/Counseling Complete Complete  Palliative Care Screening  Not Applicable  Medication Review Oceanographer) Complete Complete

## 2024-01-04 NOTE — Progress Notes (Signed)
 Dr.Preetha notified of pt's drop in BP and increase in HR from when standing.  Pt.was asymptomatic. Verifying if pt.ok to discharge.

## 2024-01-04 NOTE — Progress Notes (Signed)
  Patient Name: Cory Hess Date of Encounter: 01/04/2024  Primary Cardiologist: Lonni Hanson, MD Electrophysiologist: None  Interval Summary   The patient is doing well today.  He reports soreness in his left shoulder. At this time, the patient denies chest pain, shortness of breath, or any new concerns.  Vital Signs    Vitals:   01/03/24 2353 01/04/24 0514 01/04/24 0819 01/04/24 1214  BP: 124/71 130/74 132/74 136/77  Pulse: 71 82 77 73  Resp: 18 18 17 18   Temp: 98.5 F (36.9 C) 98.5 F (36.9 C) 98.3 F (36.8 C) 98.5 F (36.9 C)  TempSrc: Oral Oral Oral Oral  SpO2: 93% 95% 96% 96%  Weight:      Height:        Intake/Output Summary (Last 24 hours) at 01/04/2024 1422 Last data filed at 01/04/2024 1214 Gross per 24 hour  Intake 640 ml  Output 1425 ml  Net -785 ml   Filed Weights   01/02/24 1611  Weight: 90.9 kg    Physical Exam    GEN- NAD, Alert and oriented  Lungs- Clear to ausculation bilaterally, normal work of breathing Cardiac- Regular rate and rhythm, no murmurs, rubs or gallops GI- soft, NT, ND, + BS Extremities- no clubbing or cyanosis. No edema  Telemetry    SR 70-110's (personally reviewed)  Hospital Course    ACHERON SUGG is a 78 y.o. male with PMH of  tachy-brady syndrome, high degree AVB, CAD s/p CABG (2019), HTN, HLD, recent PE with PEA arrest s/p thrombectomy (03/2023), carotid artery stenosis, throat cancer s/p neck chemo/XRT, right sided chest port, hypothyroidism admitted for tachy-brady syndrome. He underwent placement of PPM on 01/03/24 by Dr. Cindie.   Assessment & Plan    Tachy-Brady Syndrome  Near Syncope  Hx CHB requiring TVP  Bifascicular Block  Hx Squamous Cell Carcinoma of Tongue  Recent PE on Eliquis   -continue pressure dressing, appt arranged for follow up with [facility] on Friday to have pressure dressing removed -he will have a wound check in 2 weeks and 90 day appt for device check  -continue Eilquis for  PE  -written instructions provided, teaching at bedside as well  CAD s/p CABG  -no anginal symptoms      For questions or updates, please contact [facility] Please consult [email or web address] for contact info under     Signed, Daphne Barrack, NP-C, AGACNP-BC [facility] - Electrophysiology  01/04/2024, 2:25 PM

## 2024-01-04 NOTE — Discharge Summary (Addendum)
 Physician Discharge Summary  DOMINYK LAW FMW:[id number] DOB: May 19, 1945 DOA: 01/02/2024  PCP: Towana Small, FNP  Admit date: 01/02/2024 Discharge date: 01/04/2024  Time spent: 45 minutes  Recommendations for Outpatient Follow-up:  EP follow-up in 10 days   Discharge Diagnoses:  Principal Problem:   Tachycardia-bradycardia syndrome (HCC) Near syncope History of complete heart block   Hx of CABG   Coronary artery disease   Near syncope   Essential hypertension   Acquired hypothyroidism   CHB (complete heart block) (HCC)   History of CVA (cerebrovascular accident)   History of pulmonary embolus (PE)   Carotid stenosis   Chronic diastolic CHF (congestive heart failure) (HCC)   Benign prostatic hyperplasia without lower urinary tract symptoms   Gastro-esophageal reflux disease without esophagitis   Hyperlipidemia, unspecified   Discharge Condition: Improved  Diet recommendation: Low sodium, heart healthy  Filed Weights   01/02/24 1611  Weight: 90.9 kg    History of present illness:  TERI DILTZ is a 78 y.o. male who presented to [facility] on 12/31/23 with dizziness and elevated HR.   He was recently hospitalized ~ 1 month ago for syncopal event requiring TVP while BB washed out.  His conduction system improved and did not require PPM implant at that time.    On 12/31/23, he was at [facility] and began experiencing dizziness.  His HR's were in the 150's on arrival to hospital.  Nodal blockers were felt to be contraindicated as he had recently required TVP.  He was evaluated by EP and felt candidate for PPM implantation due to tachy-brady.  Pt was transferred to [facility] for PPM.   Hospital Course:   Tachy-Brady Syndrome  Near Syncope  Hx CHB requiring TVP  -Transferred from [facility] to [facility] for PPM implantation - Completed yesterday -Plan for DC home today, will need close follow-up, he is not on any AV nodal blockers -EP recommended adding Toprol  to DC meds - ambulated  with PT prior to DC  Hyperlipidemia - Continue home statin   Carotid artery disease - Continue statin   CAD > Status post CABG - Continue ASA, statin, Eliquis    History of PE History of cardiac arrest > Earlier this year had PEA arrest in the setting of PE - Continue Eliquis    Chronic diastolic CHF > Last echo was in October of this year with EF 55-60%, G1 DD, normal RV function. 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   Chronic pain - Continue home oxycodone   Discharge Exam: Vitals:   01/04/24 1410 01/04/24 1432  BP: (!) 131/93   Pulse: 96 (!) 101  Resp:    Temp:    SpO2: 97% 96%   Gen: Awake, Alert, Oriented X 3,  HEENT: no JVD, swelling at [facility] site with dressing Lungs: Good air movement bilaterally, CTAB CVS: S1S2/RRR Abd: soft, Non tender, non distended, BS present Extremities: No edema, left arm in sling Skin: no new rashes on exposed skin   Discharge Instructions    Allergies as of 01/04/2024   No Known Allergies      Medication List     STOP taking these medications    losartan  25 MG tablet Commonly known as: COZAAR        TAKE these medications    acetaminophen  500 MG tablet Commonly known as: TYLENOL  Take 1,000 mg by mouth in the morning and at bedtime.   apixaban  5 MG Tabs tablet Commonly known  as: ELIQUIS  Take 1 tablet (5 mg total) by mouth 2 (two) times daily.   aspirin  81 MG chewable tablet Place 1 tablet (81 mg total) into feeding tube daily.   atorvastatin  80 MG tablet Commonly known as: LIPITOR  Place 1 tablet (80 mg total) into feeding tube daily. What changed: how to take this   Blood Pressure Kit Kit 1 each by Does not apply route in the morning, at noon, and at bedtime.   famotidine  20 MG tablet Commonly known as: PEPCID  Place 1 tablet (20 mg total) into feeding tube daily. What changed: how to take this   levothyroxine  100 MCG  tablet Commonly known as: SYNTHROID  Take 1 tablet (100 mcg total) by mouth daily before breakfast.   metoprolol  succinate 25 MG 24 hr tablet Commonly known as: TOPROL -XL Take 1 tablet (25 mg total) by mouth daily.   multivitamin with minerals Tabs tablet Take 1 tablet by mouth daily.   naphazoline-glycerin Soln Commonly known as: CLEAR EYES REDNESS Place 1 drop into both eyes 4 (four) times daily as needed for eye irritation.   oxyCODONE  5 MG immediate release tablet Commonly known as: Roxicodone  Take 1 tablet (5 mg total) by mouth every 8 (eight) hours as needed.               Durable Medical Equipment  (From admission, onward)           Start     Ordered   01/04/24 1502  For home use only DME Bedside commode  Once       Question:  Patient needs a bedside commode to treat with the following condition  Answer:  General weakness   01/04/24 1501           No Known Allergies    The results of significant diagnostics from this hospitalization (including imaging, microbiology, ancillary and laboratory) are listed below for reference.    Significant Diagnostic Studies: DG Chest 2 View Result Date: 01/04/2024 EXAM: 2 VIEW(S) XRAY OF THE CHEST 01/04/2024 06:10:31 AM COMPARISON: Portable chest 12/31/2023. CLINICAL HISTORY: [id number] Pacemaker [id number] Pacemaker. FINDINGS: LINES, TUBES AND DEVICES: A left chest dual lead pacing system has been inserted since the prior study. One of the leads terminates in the plane of the right atrium and the other in the right ventricle. A right IJ port catheter again has its tip at the superior cavoatrial junction. LUNGS AND PLEURA: The lungs are clear. No pleural effusion. No pneumothorax. HEART AND MEDIASTINUM: Sternotomy and CABG changes are present. The cardiomediastinal silhouette is normal with central vessels normal caliber . Stable mediastinum with aortic atherosclerosis. The leads of the left chest dual lead pacing system terminate in  the plane of the right atrium and the right ventricle. BONES AND SOFT TISSUES: Thoracic spondylosis. No acute osseous abnormality. Sternotomy changes are noted. IMPRESSION: 1. No acute cardiopulmonary process. 2. Left chest dual-lead pacemaker in appropriate position. 3. Right internal jugular port catheter with tip at the superior cavoatrial junction. Electronically signed by: Francis Quam MD 01/04/2024 06:51 AM EST RP Workstation: [id number]   EP PPM/ICD IMPLANT Result Date: 01/03/2024  CONCLUSIONS:  1. Symptomatic tachycardia-bradycardia syndrome  2. Successful dual chamber permanent pacemaker with left bundle area lead  3.  No early apparent complications.  4. Maintain pressure bandage on left shoulder until Friday.   DG Chest Portable 1 View Result Date: 12/31/2023 CLINICAL DATA:  Palpitations, syncopal episode. EXAM: PORTABLE CHEST 1 VIEW COMPARISON:  07/17/2023, 12/09/2023. FINDINGS: The heart size and mediastinal  contours are within normal limits. Atherosclerotic calcification of the aorta is noted. No consolidation, effusion, or pneumothorax is seen. A right chest port is stable in position. Sternotomy wires are present over the midline. No acute osseous abnormality. IMPRESSION: No active disease. Electronically Signed   By: Leita Birmingham M.D.   On: 12/31/2023 15:14   ECHOCARDIOGRAM COMPLETE Result Date: 12/11/2023    ECHOCARDIOGRAM REPORT   Patient Name:   TAYVEN RENTERIA Date of Exam: 12/11/2023 Medical Rec #:  [id number]       Height:       72.0 in Accession #:    [id number]      Weight:       212.5 lb Date of Birth:  10-Sep-1945       BSA:          2.186 m Patient Age:    78 years        BP:           115/56 mmHg Patient Gender: M               HR:           71 bpm. Exam Location:  [facility] Procedure: 2D Echo, Cardiac Doppler and Color Doppler (Both Spectral and Color            Flow Doppler were utilized during procedure). Indications:     Heart block, Complete I44.2  History:         Patient has  prior history of Echocardiogram examinations. CAD.  Sonographer:     Bari Roar Referring Phys:  [id number] RYAN M DUNN Diagnosing Phys: Annabella Scarce MD IMPRESSIONS  1. Left ventricular ejection fraction, by estimation, is 55 to 60%. The left ventricle has normal function. The left ventricle has no regional wall motion abnormalities. There is moderate concentric left ventricular hypertrophy. Left ventricular diastolic parameters are consistent with Grade I diastolic dysfunction (impaired relaxation).  2. Right ventricular systolic function is normal. The right ventricular size is normal.  3. Left atrial size was mildly dilated.  4. The mitral valve is normal in structure. Trivial mitral valve regurgitation. No evidence of mitral stenosis.  5. The aortic valve is tricuspid. Aortic valve regurgitation is not visualized. No aortic stenosis is present.  6. The inferior vena cava is normal in size with greater than 50% respiratory variability, suggesting right atrial pressure of 3 mmHg. FINDINGS  Left Ventricle: Left ventricular ejection fraction, by estimation, is 55 to 60%. The left ventricle has normal function. The left ventricle has no regional wall motion abnormalities. The left ventricular internal cavity size was normal in size. There is  moderate concentric left ventricular hypertrophy. Left ventricular diastolic parameters are consistent with Grade I diastolic dysfunction (impaired relaxation). Indeterminate filling pressures. Right Ventricle: The right ventricular size is normal. No increase in right ventricular wall thickness. Right ventricular systolic function is normal. Left Atrium: Left atrial size was mildly dilated. Right Atrium: Right atrial size was normal in size. Pericardium: There is no evidence of pericardial effusion. Mitral Valve: The mitral valve is normal in structure. Trivial mitral valve regurgitation. No evidence of mitral valve stenosis. MV peak gradient, 5.7 mmHg. The mean mitral valve  gradient is 2.0 mmHg. Tricuspid Valve: The tricuspid valve is normal in structure. Tricuspid valve regurgitation is trivial. No evidence of tricuspid stenosis. Aortic Valve: The aortic valve is tricuspid. Aortic valve regurgitation is not visualized. No aortic stenosis is present. Aortic valve mean gradient measures 2.0 mmHg. Aortic valve peak  gradient measures 4.4 mmHg. Aortic valve area, by VTI measures 2.09 cm. Pulmonic Valve: The pulmonic valve was normal in structure. Pulmonic valve regurgitation is not visualized. No evidence of pulmonic stenosis. Aorta: The aortic root is normal in size and structure. Venous: The inferior vena cava is normal in size with greater than 50% respiratory variability, suggesting right atrial pressure of 3 mmHg. IAS/Shunts: No atrial level shunt detected by color flow Doppler.  LEFT VENTRICLE PLAX 2D LVIDd:         4.60 cm   Diastology LVIDs:         3.20 cm   LV e' medial:    7.62 cm/s LV PW:         1.20 cm   LV E/e' medial:  10.8 LV IVS:        1.40 cm   LV e' lateral:   11.40 cm/s LVOT diam:     1.90 cm   LV E/e' lateral: 7.2 LV SV:         41 LV SV Index:   19 LVOT Area:     2.84 cm  RIGHT VENTRICLE RV Basal diam:  3.60 cm RV Mid diam:    3.20 cm RV S prime:     10.40 cm/s TAPSE (M-mode): 2.1 cm LEFT ATRIUM             Index        RIGHT ATRIUM           Index LA diam:        4.30 cm 1.97 cm/m   RA Area:     20.20 cm LA Vol (A2C):   66.3 ml 30.33 ml/m  RA Volume:   56.70 ml  25.93 ml/m LA Vol (A4C):   67.0 ml 30.65 ml/m LA Biplane Vol: 69.3 ml 31.70 ml/m  AORTIC VALVE                    PULMONIC VALVE AV Area (Vmax):    2.09 cm     PV Vmax:        1.48 m/s AV Area (Vmean):   1.82 cm     PV Peak grad:   8.8 mmHg AV Area (VTI):     2.09 cm     RVOT Peak grad: 2 mmHg AV Vmax:           105.00 cm/s AV Vmean:          72.700 cm/s AV VTI:            0.194 m AV Peak Grad:      4.4 mmHg AV Mean Grad:      2.0 mmHg LVOT Vmax:         77.50 cm/s LVOT Vmean:        46.600 cm/s  LVOT VTI:          0.143 m LVOT/AV VTI ratio: 0.74  AORTA Ao Root diam: 3.10 cm Ao Asc diam:  3.60 cm MITRAL VALVE                TRICUSPID VALVE MV Area (PHT): 4.44 cm     TR Peak grad:   21.7 mmHg MV Area VTI:   1.61 cm     TR Vmax:        233.00 cm/s MV Peak grad:  5.7 mmHg MV Mean grad:  2.0 mmHg     SHUNTS MV Vmax:       1.19 m/s  Systemic VTI:  0.14 m MV Vmean:      63.7 cm/s    Systemic Diam: 1.90 cm MV Decel Time: 171 msec MV E velocity: 82.00 cm/s MV A velocity: 120.00 cm/s MV E/A ratio:  0.68 MV A Prime:    17.3 cm/s Annabella Scarce MD Electronically signed by Annabella Scarce MD Signature Date/Time: 12/11/2023/10:51:20 AM    Final    CT ANGIO NECK W OR WO CONTRAST Result Date: 12/10/2023 EXAM: CTA Neck 12/10/2023 10:39:42 AM TECHNIQUE: CT of the neck was performed without and with the administration of 75 mL of iohexol  (OMNIPAQUE ) 350 MG/ML injection. Multiplanar 2D and/or 3D reformatted images are provided for review. Automated exposure control, iterative reconstruction, and/or weight based adjustment of the mA/kV was utilized to reduce the radiation dose to as low as reasonably achievable. Stenosis of the internal carotid arteries measured using NASCET criteria. COMPARISON: None available CLINICAL HISTORY: Syncope/presyncope, cerebrovascular cause suspected. FINDINGS: AORTIC ARCH AND ARCH VESSELS: Mild atherosclerosis of the aortic arch. No dissection or arterial injury. No significant stenosis of the brachiocephalic or subclavian arteries. CERVICAL CAROTID ARTERIES: The right carotid artery is patent from the origin to the skull base. Mixed atherosclerotic plaque at the carotid bifurcation and right carotid bulb results in approximately 55% stenosis of the right ICA origin. There is an additional focal prominent region of noncalcified atherosclerotic plaque in the proximal right cervical ICA with a similar degree of narrowing. The left carotid artery is patent from the origin to the skull  base. Atherosclerosis at the left carotid bifurcation without hemodynamically significant stenosis. No dissection or arterial injury. CERVICAL VERTEBRAL ARTERIES: Vertebral arteries are patent from the origins to the vertebrobasilar confluence. Atherosclerosis at the right vertebral artery origin results in moderate stenosis. There is additional mild stenosis at the left vertebral artery origin. Tortuosity of the left V1 segment. The intracranial vertebral arteries are patent. There is an atretic appearance of the post PICA segment of the left vertebral artery with significant distal tapering of the left V4 segment without evidence of occlusion. No dissection or arterial injury. LUNGS AND MEDIASTINUM: Right chest wall port-a-cath is partially visualized. Sequelae of median sternotomy. Lungs are unremarkable. SOFT TISSUES: Bilateral lens replacement. Mucosal thickening and mucous retention cyst in the left maxillary sinus. No acute abnormality. BONES: No acute abnormality. LIMITATIONS/ARTIFACTS AND INCIDENTAL INTRACRANIAL FINDINGS: Limited visualization of intracranial structures. There is a small remote infarct noted within the superior right cerebellum. IMPRESSION: 1. Mixed atherosclerotic plaque at the right carotid bifurcation and bulb resulting in approximately 55% stenosis of the right ICA origin. 2. Moderate stenosis at the right vertebral artery origin. 3. Additional atherosclerosis as above. 4. 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 Electronically signed by: Donnice Mania MD 12/10/2023 10:55 AM EDT RP Workstation: [id number]   CT Angio Abd/Pel W and/or Wo Contrast Result Date: 12/09/2023 CLINICAL DATA:  History of left inguinal hernia repair with hematoma. Suspicion for active bleeding. EXAM: CTA ABDOMEN AND PELVIS WITHOUT AND WITH CONTRAST TECHNIQUE: Multidetector CT imaging of the abdomen and pelvis was performed using the standard protocol during bolus administration of intravenous  contrast. Multiplanar reconstructed images and MIPs were obtained and reviewed to evaluate the vascular anatomy. RADIATION DOSE REDUCTION: This exam was performed according to the departmental dose-optimization program which includes automated exposure control, adjustment of the mA and/or kV according to patient size and/or use of iterative reconstruction technique. CONTRAST:  OMNIPAQUE  IOHEXOL  350 MG/ML SOLN COMPARISON:  Chest CT scan earlier, same date. Prior abdominal  CT scan and 07/17/2023 FINDINGS: VASCULAR Aorta: Stable moderate to advanced age related atherosclerotic calcifications but no aneurysm. Sizable ulcerated plaque or focal dissection noted involving the posterior aspect of the lower abdominal aorta on the left side measuring approximately 2.2 cm. Celiac: Atherosclerotic calcifications but no significant stenosis or dissection. SMA: Atherosclerotic calcifications but no significant stenosis. Renals: 2 renal arteries bilaterally. Two right and 3 left renal arteries no significant stenosis. Scattered atherosclerotic calcifications. IMA: Patent Inflow: Moderate atherosclerotic calcifications but no stenosis or dissection. Proximal Outflow: No significant findings. Scattered calcifications. Veins: No significant findings. Review of the MIP images confirms the above findings. NON-VASCULAR Lower chest: No acute pulmonary findings. Pulmonary scarring changes. Hepatobiliary: No hepatic lesions or intrahepatic biliary dilatation. Gallbladder is unremarkable. No common bile duct dilatation. Pancreas: Unremarkable. No pancreatic ductal dilatation or surrounding inflammatory changes. Spleen: Normal in size without focal abnormality. Adrenals/Urinary Tract: Adrenal glands and kidneys are unremarkable. There is contrast in the renal collecting systems from prior CT scan. Bilateral renal cysts, unchanged. The bladder is unremarkable. Stomach/Bowel: The stomach, duodenum, small bowel and colon are grossly  normal without oral contrast. No inflammatory changes, mass lesions or obstructive findings. The appendix is normal. Lymphatic: No abdominal or pelvic lymphadenopathy. Reproductive: Enlarged prostate gland. The seminal vesicles are unremarkable. Other: There is a sizable left groin hematoma measuring approximately 7.8 x 2.7 cm. This is anterior to the inguinal canal. No extravasation of contrast material to suggest active bleeding. Small tortuous vessels in the spermatic cords bilaterally. Musculoskeletal: No significant bony findings. IMPRESSION: 1. Sizable left groin hematoma but no extravasation of contrast material to suggest active bleeding. 2. Sizable ulcerated plaque or focal dissection involving the posterior aspect of the lower abdominal aorta on the left side measuring approximately 2.2 cm. 3. No acute abdominal/pelvic findings, mass lesions or adenopathy. 4. Enlarged prostate gland. 5. Aortic atherosclerosis. Aortic Atherosclerosis (ICD10-I70.0). Electronically Signed   By: MYRTIS Stammer M.D.   On: 12/09/2023 18:02   CT Angio Chest PE W and/or Wo Contrast Result Date: 12/09/2023 CLINICAL DATA:  Pulmonary embolism (PE) suspected, high prob. Near syncopal episode. History of tongue carcinoma. * Tracking Code: BO * EXAM: CT ANGIOGRAPHY CHEST WITH CONTRAST TECHNIQUE: Multidetector CT imaging of the chest was performed using the standard protocol during bolus administration of intravenous contrast. Multiplanar CT image reconstructions and MIPs were obtained to evaluate the vascular anatomy. RADIATION DOSE REDUCTION: This exam was performed according to the departmental dose-optimization program which includes automated exposure control, adjustment of the mA and/or kV according to patient size and/or use of iterative reconstruction technique. CONTRAST:  75mL OMNIPAQUE  IOHEXOL  350 MG/ML SOLN COMPARISON:  CT angiography chest from 07/17/2023. FINDINGS: Cardiovascular: No evidence of embolism to the proximal  subsegmental pulmonary artery level. Mild cardiomegaly. No pericardial effusion. No aortic aneurysm. There are coronary artery calcifications, in keeping with coronary artery disease. There are also mild peripheral atherosclerotic vascular calcifications of thoracic aorta and its major branches. Mediastinum/Nodes: Visualized thyroid  gland appears grossly unremarkable. No solid / cystic mediastinal masses. The esophagus is nondistended precluding optimal assessment. No axillary, mediastinal or hilar lymphadenopathy by size criteria. Lungs/Pleura: The central tracheo-bronchial tree is patent. There is mild, smooth, circumferential thickening of the segmental and subsegmental bronchial walls, throughout bilateral lungs, which is nonspecific. Findings are most commonly seen with bronchitis or reactive airway disease, such as asthma. There are dependent changes in bilateral lungs. No mass or consolidation. No pleural effusion or pneumothorax. There is a new 5 x 6 mm ground-glass nodule  in the right upper lobe (series 5, image 62). Upper Abdomen: Visualized upper abdominal viscera within normal limits. Musculoskeletal: The visualized soft tissues of the chest wall are grossly unremarkable. No suspicious osseous lesions. There are mild to moderate multilevel degenerative changes in the visualized spine. Review of the MIP images confirms the above findings. IMPRESSION: 1. No embolism to the proximal subsegmental pulmonary artery level. No lung mass, consolidation, pleural effusion or pneumothorax. 2. There is a new 5 x 6 mm ground-glass nodule in the right upper lobe. Consider short-term follow-up examination in 3 months to document resolution versus stability. 3. Multiple other nonacute observations, as described above. Aortic Atherosclerosis (ICD10-I70.0). Electronically Signed   By: Ree Molt M.D.   On: 12/09/2023 15:51    Microbiology: Recent Results (from the past 240 hours)  Surgical pcr screen     Status:  None   Collection Time: 01/02/24  6:57 PM   Specimen: Nasal Mucosa; Nasal Swab  Result Value Ref Range Status   MRSA, PCR NEGATIVE NEGATIVE Final   Staphylococcus aureus NEGATIVE NEGATIVE Final    Comment: (NOTE) The Xpert SA Assay (FDA approved for NASAL specimens in patients 36 years of age and older), is one component of a comprehensive surveillance program. It is not intended to diagnose infection nor to guide or monitor treatment. Performed at [facility], [address], [place], [place] [address]      Labs: Basic Metabolic Panel: Recent Labs  Lab 12/31/23 1440 01/01/24 0423 01/02/24 0509 01/03/24 0428  NA 141 140 141 140  K 4.2 4.1 4.6 4.4  CL 103 107 105 105  CO2 27 24 26 25   GLUCOSE 101* 102* 83 98  BUN 20 19 18 18   CREATININE 1.42* 1.12 1.28* 1.30*  CALCIUM  9.1 8.9 8.8* 8.9  MG  --   --   --  1.9   Liver Function Tests: Recent Labs  Lab 12/31/23 1440 01/03/24 0428  AST 30 21  ALT 25 20  ALKPHOS 66 58  BILITOT 0.8 0.8  PROT 6.9 5.9*  ALBUMIN  4.1 3.2*   No results for input(s): LIPASE, AMYLASE in the last 168 hours. No results for input(s): AMMONIA in the last 168 hours. CBC: Recent Labs  Lab 12/31/23 1440 01/01/24 0423 01/02/24 0509 01/03/24 0428  WBC 7.1 6.6 5.9 5.6  NEUTROABS 5.7  --  3.9  --   HGB 12.2* 12.8* 12.1* 12.3*  HCT 38.1* 39.3 37.3* 36.8*  MCV 96.5 95.6 97.6 94.4  PLT 129* 153 125* 129*   Cardiac Enzymes: No results for input(s): CKTOTAL, CKMB, CKMBINDEX, TROPONINI in the last 168 hours. BNP: BNP (last 3 results) Recent Labs    04/10/23 0418 04/15/23 0350 12/31/23 1439  BNP 437.6* 498.8* 112.1*    ProBNP (last 3 results) No results for input(s): PROBNP in the last 8760 hours.  CBG: No results for input(s): GLUCAP in the last 168 hours.     Signed:  Sigurd Pac MD.  [facility] 01/04/2024, 3:06 PM

## 2024-01-05 ENCOUNTER — Other Ambulatory Visit: Payer: Self-pay

## 2024-01-05 ENCOUNTER — Telehealth: Payer: Self-pay

## 2024-01-05 NOTE — Transitions of Care (Post Inpatient/ED Visit) (Signed)
 01/05/2024  Name: Cory Hess MRN: [id number] DOB: 06/12/1945  Today's TOC FU Call Status: Today's TOC FU Call Status:: Successful TOC FU Call Completed TOC FU Call Complete Date: 01/05/24  Patient's Name and Date of Birth confirmed. 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 How have you been since you were released from the hospital?: Better Any questions or concerns?: No  Items Reviewed: Did you receive and understand the discharge instructions provided?: Yes Medications obtained,verified, and reconciled?: Yes (Medications Reviewed) Dietary orders reviewed?: Yes Type of Diet Ordered:: Low sodium, heart healthy Do you have support at home?: Yes Name of Support/Comfort Primary Source: Patient lives alone and states his sister, Ricka helps as needed including transportation to MD appts  Medications Reviewed Today: Medications Reviewed Today     Reviewed by Lauro Shona LABOR, RN (Registered Nurse) on 01/05/24 at 1434  Med List Status: <None>   Medication Order Taking? Sig Documenting Provider Last Dose Status Informant  acetaminophen  (TYLENOL ) 500 MG tablet [id number] Yes Take 1,000 mg by mouth in the morning and at bedtime. [provider]  Active Self, Pharmacy Records  apixaban  (ELIQUIS ) 5 MG TABS tablet [id number]  Take 1 tablet (5 mg total) by mouth 2 (two) times daily.  Patient not taking: Reported on 01/05/2024   Marinda Jayson KIDD, MD  Active Self, Pharmacy Records  aspirin  81 MG chewable tablet [id number]  Place 1 tablet (81 mg total) into feeding tube daily.  Patient not taking: Reported on 01/05/2024   Marinda Jayson KIDD, MD  Active Self, Pharmacy Records  atorvastatin  (LIPITOR ) 80 MG tablet [id number] Yes Place 1 tablet (80 mg total) into feeding tube daily.  Patient  taking differently: Take 80 mg by mouth daily. Patient states he no longer has a feeding tube and takes this by mouth   Abigail Bernardino HERO, PA-C  Active Self, Pharmacy Records  Blood Pressure Monitoring (BLOOD PRESSURE KIT) KIT [id number] Yes 1 each by Does not apply route in the morning, at noon, and at bedtime. Towana Small, FNP  Active Self, Pharmacy Records  famotidine  (PEPCID ) 20 MG tablet [id number]  Place 1 tablet (20 mg total) into feeding tube daily.  Patient not taking: Reported on 01/05/2024   Vivienne Lonni Ingle, NP  Active Self, Pharmacy Records  Heparin  (Porcine) in NaCl 1000-0.9 UT/500ML-% SOLN [id number]   Marea Selinda RAMAN, MD  Active   Heparin  (Porcine) in NaCl 2000-0.9 UNIT/L-% SOLN [id number]   Marea Selinda RAMAN, MD  Active   iodixanol  (VISIPAQUE ) 320 MG/ML injection [id number]   Marea Selinda RAMAN, MD  Active   levothyroxine  (SYNTHROID ) 100 MCG tablet [id number] Yes Take 1 tablet (100 mcg total) by mouth daily before breakfast. Brahmanday, Govinda R, MD  Active Self, Pharmacy Records           Med Note CATHY OVAL DEL   Dju Dec 31, 2023  3:59 PM)    lidocaine -EPINEPHrine  (PF) (XYLOCAINE -EPINEPHrine ) 1 %-1:200000 (PF) injection [id number]   Marea Selinda RAMAN, MD  Active   metoprolol  succinate (TOPROL -XL) 25 MG 24 hr tablet [id number] Yes Take 1 tablet (25 mg total) by mouth daily. Aniceto Daphne CROME, NP  Active   Multiple Vitamin (MULTIVITAMIN WITH MINERALS) TABS tablet [id number] Yes Take 1 tablet by mouth daily. [provider]  Active Self, Pharmacy Records  naphazoline-glycerin (CLEAR EYES REDNESS) SOLN [id number]  Place 1 drop into both  eyes 4 (four) times daily as needed for eye irritation.  Patient not taking: Reported on 01/05/2024   Fairy Frames, MD  Active   oxyCODONE  (ROXICODONE ) 5 MG immediate release tablet [id number]  Take 1 tablet (5 mg total) by mouth every 8 (eight) hours as needed.  Patient not taking: Reported on 01/05/2024   Marinda Jayson KIDD, MD  Active Self, Pharmacy  Records            Home Care and Equipment/Supplies: Were Home Health Services Ordered?: Yes Name of Home Health Agency:: [facility] [phone] Has Agency set up a time to come to your home?: Yes [facility] RN made conference call with patient and was able to schedule initial visit 01/06/24 in the am) First Home Health Visit Date: 01/06/24 Any new equipment or medical supplies ordered?: Yes Name of Medical supply agency?: [employer] noted - came home from hospital with shower chair 3in1 Were you able to get the equipment/medical supplies?: Yes Do you have any questions related to the use of the equipment/supplies?: No  Functional Questionnaire: Do you need assistance with bathing/showering or dressing?: No (Patient states currently he is washing off since he has pacemaker) Do you need assistance with meal preparation?: No Do you need assistance with eating?: No Do you have difficulty maintaining continence: No Do you need assistance with getting out of bed/getting out of a chair/moving?: No Do you have difficulty managing or taking your medications?: Yes (patient reviewed medications with TOC RN and reports he does not have some of his medications - Call was placed to cardiology and message left asking them to call patient . Patient has appt with them tomorrow)  Follow up appointments reviewed: PCP Follow-up appointment confirmed?: Yes Date of PCP follow-up appointment?: 01/11/24 (Called and scheduled) Follow-up Provider: PCP Specialist Hospital Follow-up appointment confirmed?: Yes Date of Specialist follow-up appointment?: 01/06/24 Follow-Up Specialty Provider:: Cardiology Do you need transportation to your follow-up appointment?: No Do you understand care options if your condition(s) worsen?: Yes-patient verbalized understanding  SDOH Interventions Today    Flowsheet Row Most Recent Value  SDOH Interventions   Food Insecurity Interventions Intervention Not Indicated   Housing Interventions Intervention Not Indicated  Transportation Interventions Intervention Not Indicated  Utilities Interventions Intervention Not Indicated    Goals Addressed             This Visit's Progress    VBCI Transitions of Care (TOC) Care Plan       Problems:  Recent Hospitalization for treatment of Admit/Discharge Date: 11/10 -  11/12 [facility] Primary Diagnosis: Tachycardia-bradycardia syndrome with pacemaker Medication management barrier During medication reconciliation, patient reported not having Eliquis , Asprin, Famotidine , Clear eyes, or oxycodone  - Pharmacy has refills of Eliquis  and Famotidine  and are refilling, patient advised to pick up Aspirin  81mg  and Clear eyes from OTC and speak with MD at his 01/06/24 pacer wound check appt about pain management/possible new Rx TOC RN was unable to review all assessments due to time on the phone assisting patient - Of note: at beginning of call patient stated, I don't know how you can help me over the phone and by the end of the call patient stated he was very thankful for everything TOC RN did to help him and he agreed to enroll and agreed to follow up  Goal:  Over the next 30 days, the patient will not experience hospital readmission  Interventions:  Transitions of Care: Durable Medical Equipment (DME) reviewed with patient/caregiver and delivery confirmed Doctor  Visits  - discussed the importance of doctor visits Communication with [facility] [place] [facility] [place] re: need for refill of Eliquis  and Famotidine  and patient to pick up 81mg  aspirin  OTC and clear eyes and may need assistance when he comes to pick up refilled meds Contacted provider for patient needs via conference call with patient to PCP office to schedule hospital follow up - 01/11/24 scheduled and cardiology office with message left with patient's number to make them aware he discharged home without Eliquis  and Asprin 81mg  and Famotidine  and pain med and clear  eyes - Patient states when they call him back he will let them know he had refills of Eliquis  and famotidine  at pharmacy and he will pick up ASA and Clear eyes OTC and will discuss pain management at his appt 01/06/24 for pacer wound check Contacted Health RN/OT/PT - Conference call was placed to [facility] at [phone] and Home health schedule to see patient 01/06/24 in the morning  Patient advised TOC RN that he understands restrictions and instructions after his pacemaker placement  Patient Self Care Activities:  Attend all scheduled provider appointments Call pharmacy for medication refills 3-7 days in advance of running out of medications Call provider office for new concerns or questions  Notify RN Care Manager of [facility] call rescheduling needs Participate in Transition of Care Program/Attend TOC scheduled calls Take medications as prescribed    Plan:  Telephone follow up appointment with care management team member scheduled for:  01/12/24 in the afternoon  The patient has been provided with contact information for the care management team and has been advised to call with any health related questions or concerns.         Shona Prow RN, CCM [place]  VBCI-Population Health RN Care Manager [phone]

## 2024-01-05 NOTE — Addendum Note (Signed)
 Encounter addended by: Cristopher Olivia PARAS, RN on: 01/05/2024 7:58 AM  Actions taken: Imaging Exam ended

## 2024-01-06 ENCOUNTER — Inpatient Hospital Stay (INDEPENDENT_AMBULATORY_CARE_PROVIDER_SITE_OTHER)

## 2024-01-06 DIAGNOSIS — I495 Sick sinus syndrome: Secondary | ICD-10-CM

## 2024-01-06 NOTE — Patient Instructions (Signed)
 ? ?  After Your Pacemaker ? ? ?Monitor your pacemaker site for redness, swelling, and drainage. Call the device clinic at [phone] if you experience these symptoms or fever/chills. ? ?

## 2024-01-06 NOTE — Progress Notes (Signed)
 Patient seen in device clinic to remove pressure dressing. Pressure dressing removed and no swelling noted at or around incision site. Steri-strips remain in place and intact. Patient advised no shower for 7 days. Advised to leave steri-strips in place and staff will remove at next wound check. Advised to call if any concerns including swelling, redness. Drainage, fever or chills. Pt voiced understanding and agreeable to plan.

## 2024-01-09 ENCOUNTER — Other Ambulatory Visit: Payer: Self-pay | Admitting: General Surgery

## 2024-01-09 ENCOUNTER — Telehealth: Payer: Self-pay | Admitting: General Surgery

## 2024-01-09 MED ORDER — TRAMADOL HCL 50 MG PO TABS: 50.0000 mg | ORAL_TABLET | Freq: Four times a day (QID) | ORAL | 0 refills | Status: DC | PRN

## 2024-01-09 NOTE — Telephone Encounter (Signed)
 Patient had left inguinal hernia repair done on 12/05/23 Dr Marinda.  Last saw for post op 01/08/24.  Patient states that has a hematoma at the surgical site, it is getting larger and more painful.  Wants to know if can have Rx called in for pain medicine. Please call patient to advise.  Thank you.

## 2024-01-11 ENCOUNTER — Encounter: Payer: Self-pay | Admitting: Emergency Medicine

## 2024-01-11 ENCOUNTER — Ambulatory Visit (INDEPENDENT_AMBULATORY_CARE_PROVIDER_SITE_OTHER): Admitting: Family Medicine

## 2024-01-11 ENCOUNTER — Encounter: Payer: Self-pay | Admitting: Family Medicine

## 2024-01-11 VITALS — BP 133/77 | HR 70 | Resp 16 | Ht 72.0 in | Wt 210.4 lb

## 2024-01-11 DIAGNOSIS — Z8673 Personal history of transient ischemic attack (TIA), and cerebral infarction without residual deficits: Secondary | ICD-10-CM

## 2024-01-11 DIAGNOSIS — I495 Sick sinus syndrome: Secondary | ICD-10-CM

## 2024-01-11 DIAGNOSIS — K219 Gastro-esophageal reflux disease without esophagitis: Secondary | ICD-10-CM | POA: Diagnosis not present

## 2024-01-11 DIAGNOSIS — Z951 Presence of aortocoronary bypass graft: Secondary | ICD-10-CM

## 2024-01-11 DIAGNOSIS — I442 Atrioventricular block, complete: Secondary | ICD-10-CM

## 2024-01-11 DIAGNOSIS — R55 Syncope and collapse: Secondary | ICD-10-CM

## 2024-01-11 DIAGNOSIS — I2510 Atherosclerotic heart disease of native coronary artery without angina pectoris: Secondary | ICD-10-CM

## 2024-01-11 DIAGNOSIS — E039 Hypothyroidism, unspecified: Secondary | ICD-10-CM | POA: Diagnosis not present

## 2024-01-11 MED ORDER — FAMOTIDINE 20 MG PO TABS: 20.0000 mg | ORAL_TABLET | Freq: Every day | ORAL | 3 refills | Status: DC

## 2024-01-11 MED ORDER — ATORVASTATIN CALCIUM 80 MG PO TABS: 80.0000 mg | ORAL_TABLET | Freq: Every day | ORAL | 3 refills | Status: AC

## 2024-01-11 NOTE — Patient Instructions (Signed)
 MyChart:  For all urgent or time sensitive needs we ask that you please call the office to avoid delays. Our number is [phone]) [phone]. MyChart is not constantly monitored and due to the large volume of messages a day, replies may take up to 72 business hours.   MyChart Policy: MyChart allows for you to see your visit notes, after visit summary, provider recommendations, lab and tests results, make an appointment, request refills, and contact your provider or the office for non-urgent questions or concerns. Providers are seeing patients during normal business hours and do not have built in time to review MyChart messages.  We ask that you allow a minimum of 3 business days for responses to [employer]. For this reason, please do not send urgent requests through MyChart. Please call the office at [phone]. New and ongoing conditions may require a visit. We have virtual and in person visit available for your convenience.  Complex MyChart concerns may require a visit. Your provider may request you schedule a virtual or in person visit to ensure we are providing the best care possible. MyChart messages sent after 11:00 AM on Friday will not be received by the provider until Monday morning.    Lab and Test Results: You will receive your lab and test results on MyChart as soon as they are completed and results have been sent by the lab or testing facility. Due to this service, you will receive your results BEFORE your provider.  I review lab and tests results each morning prior to seeing patients. Some results require collaboration with other providers to ensure you are receiving the most appropriate care. For this reason, we ask that you please allow a minimum of 3-5 business days from the time the ALL results have been received for your provider to receive and review lab and test results and contact you about these.  Most lab and test result comments from the provider will be sent through  MyChart. Your provider may recommend changes to the plan of care, follow-up visits, repeat testing, ask questions, or request an office visit to discuss these results. You may reply directly to this message or call the office at [phone] to provide information for the provider or set up an appointment. In some instances, you will be called with test results and recommendations. Please let us  know if this is preferred and we will make note of this in your chart to provide this for you.    If you have not heard a response to your lab or test results in 5 business days from all results returning to MyChart, please call the office to let us  know. We ask that you please avoid calling prior to this time unless there is an emergent concern. Due to high call volumes, this can delay the resulting process.   After Hours: For all non-emergency after hours needs, please call the office at [phone] and select the option to reach the on-call provider service. On-call services are shared between multiple [place] offices and therefore it will not be possible to speak directly with your provider. On-call providers may provide medical advice and recommendations, but are unable to provide refills for maintenance medications.  For all emergency or urgent medical needs after normal business hours, we recommend that you seek care at the closest Urgent Care or Emergency Department to ensure appropriate treatment in a timely manner.  [facility] [place] has a 24 hour emergency room located on the ground  floor for your convenience.    Urgent Concerns During the Business Day Providers are seeing patients from 8AM to 5PM, Monday through Thursday, and 8AM to 12PM on Friday with a busy schedule and are most often not able to respond to non-urgent calls until the end of the day or the next business day. If you should have URGENT concerns during the day, please call and speak to the nurse or schedule a  same day appointment so that we can address your concern without delay.    Thank you, again, for choosing me as your health care partner. I appreciate your trust and look forward to learning more about you.    Evalene Arts, FNP-C

## 2024-01-11 NOTE — Progress Notes (Signed)
 Established Patient Office Visit  Subjective  Patient ID: Cory Hess, male    DOB: [birth year]  Age: 78 y.o. MRN: [id number]  CC: Hospital Follow-Up   Discussed the use of AI scribe software for clinical note transcription with the patient, who gave verbal consent to proceed.  History of Present Illness   Cory Hess is a 78 year old male who presents for a hospital follow-up, with complications from antihypertensive medication and recent hernia surgery and PPM placement.  Cory Hess is a 78 y.o. male who presented to [facility] on 12/31/23 with dizziness and elevated HR.   He was recently hospitalized ~ 1 month ago for syncopal event requiring TVP while BB washed out.  His conduction system improved and did not require PPM implant at that time.    On 12/31/23, he was at [facility] and began experiencing dizziness.  His HR's were in the 150's on arrival to hospital.  Nodal blockers were felt to be contraindicated as he had recently required TVP.  He was evaluated by EP and felt candidate for PPM implantation due to tachy-brady syndrome with PPM placement.  Pt was transferred to [facility] for PPM. He was discharged from [facility] on 01/04/2024. He was told to stop taking losartan  25mg  and Coreg  6.25mg  BID. He has been started on metoprolol  and tolerating this medication well.   He experienced significant bradycardia after taking carvedilol , leading to severe bradycardia. He was hospitalized, and a temporary pacemaker was placed. During his three-day hospital stay, his heart rate remained above 50. After discharge, his heart rate increased to over 150, necessitating another urgent care visit and hospital admission. He has since been prescribed metoprolol , which he finds more effective, and he is no longer taking losartan .  He underwent hernia surgery on December 05, 2023, which resulted in a large hematoma at the incision site. The hematoma caused significant bruising and eventually broke the incision open,  leading to drainage of old blood. The hematoma has reduced in size but initially caused extensive bruising from his hip around to his abdomen.  He continues to take famotidine  for acid reflux and Lipitor  for cholesterol management. He reports needing refills for these medications.  He mentions ongoing issues with his thyroid  function, noting that his thyroid  levels remain high despite being on 100 mg of medication. He experiences sluggishness, which he attributes to his thyroid  condition.      ROS: see HPI    Objective:    BP 133/77   Pulse 70   Resp 16   Ht 6' (1.829 m)   Wt 210 lb 6.4 oz (95.4 kg)   SpO2 97%   BMI 28.54 kg/m  BP Readings from Last 3 Encounters:  01/11/24 133/77  01/05/24 (!) 136/91  01/04/24 (!) 140/75     Physical Exam Vitals reviewed.  Constitutional:      Appearance: Normal appearance.  Cardiovascular:     Rate and Rhythm: Normal rate and regular rhythm.     Pulses: Normal pulses.     Heart sounds: Normal heart sounds.  Pulmonary:     Effort: Pulmonary effort is normal.     Breath sounds: Normal breath sounds.  Neurological:     Mental Status: He is alert.  Psychiatric:        Mood and Affect: Mood normal.        Behavior: Behavior normal.     Assessment & Plan:   1. Tachy-brady syndrome (HCC) (Primary) Recent bradycardia and heart rate fluctuations  required temporary pacemaker. Rehospitalization for heart rate over 150 bpm led to pacemaker placement. Metoprolol  better tolerated. Continue metoprolol  for heart rate management. Follow up with cardiology for pacemaker evaluation.  2. Coronary artery disease involving native coronary artery of native heart without angina pectoris Discussed patient to continue taking atorvastatin . Rx sent to pharmacy on file.  - atorvastatin  (LIPITOR ) 80 MG tablet; Take 1 tablet (80 mg total) by mouth daily.  Dispense: 90 tablet; Refill: 3  3. History of CVA (cerebrovascular accident) See #2- followed by  cardiology.  - atorvastatin  (LIPITOR ) 80 MG tablet; Take 1 tablet (80 mg total) by mouth daily.  Dispense: 90 tablet; Refill: 3  4. Hx of CABG See #2- followed by cardiology.  - atorvastatin  (LIPITOR ) 80 MG tablet; Take 1 tablet (80 mg total) by mouth daily.  Dispense: 90 tablet; Refill: 3  5. Gastroesophageal reflux disease, unspecified whether esophagitis present Managing GERD with famotidine . Refilled famotidine  prescription. - famotidine  (PEPCID ) 20 MG tablet; Take 1 tablet (20 mg total) by mouth daily.  Dispense: 90 tablet; Refill: 3  6. Acquired hypothyroidism Thyroid  function elevated despite 100 mcg levothyroxine . High TSH indicates insufficient hormone production. Ordered repeat thyroid  function tests. - TSH+T4F+T3Free   Return in about 3 months (around 04/12/2024) for chronic conditions.    Evalene Arts, FNP

## 2024-01-12 ENCOUNTER — Ambulatory Visit: Payer: Self-pay | Admitting: Physician Assistant

## 2024-01-12 ENCOUNTER — Telehealth: Payer: Self-pay

## 2024-01-12 NOTE — Telephone Encounter (Signed)
 Pt returning call. Pt disconnected while holding. Please advise.

## 2024-01-12 NOTE — Transitions of Care (Post Inpatient/ED Visit) (Signed)
 Transition of Care week 2  Visit Note  01/12/2024  Name: Cory Hess MRN: [id number]          DOB: February 21, 1946  Situation: Patient enrolled in [facility] 30-day program. Visit completed with patient by telephone.   Background: Admit/Discharge Date: 11/10 -  11/12 Jolynn Pack Primary Diagnosis: Tachycardia-bradycardia syndrome  Initial Transition Care Management Follow-up Telephone Call Discharge Date and Diagnosis: 01/04/24, Tachycardia-bradycardia syndrome   Past Medical History:  Diagnosis Date   Abscess of right arm 03/2023   pt states had absess to right arm after iv was inserted. Pt states he had a wound vac for 2 weeks   Acute kidney injury 04/06/2023   Acute on chronic HFrEF (heart failure with reduced ejection fraction) (HCC) 04/05/2023   Acute respiratory failure with hypoxia and hypercapnia (HCC) 04/10/2023   Alternating RBBB & LBBB    Ankle swelling 2024   Anticoagulated on apixaban     Aortic atherosclerosis    Arthritis of neck    Bilateral inguinal hernia    Bladder tumor    BPH (benign prostatic hyperplasia)    CAD (coronary artery disease)    a. 08/2017 STEMI/Cath: LM 80ost/d, LAD 100p, LCX 95 ost/p, RCA 60p, 23m, RPDA 80ost; b. 08/2017 CABG x 3: LIMA->LAD, VG->OM, VG->PDA; c. 03/2023 Cardiac Arrest/Cath: Occluded VG->RPDA, patent LIMA->LAD and VG->pLCX. No new native dzs->Med rx. EF 25-35%.   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   CVA (cerebral vascular accident) (HCC) 04/08/2023   a.) brain MRI 04/08/2023: 2 small acute  infarctions affecting the cortical brain in the RIGHT frontal region.   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 08/2017 TEE: EF 30-35%, sept/ant/inf HK, apical AK. 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 12/2017 Echo: EF 45-50%, diff HK; c. 09/2022 Echo: EF 55-60%; d. 03/2023 Echo (in settng of PE): EF 40-45%, GrI DD (imaging poor); e. 03/2023 Echo w/ definity : EF 55-60%, nl RV fxn.   Hyperlipidemia LDL goal <70    Hypothyroidism    Ischemic cardiomyopathy    a. 08/2017 TEE: EF 30-35%; b. 12/2017 Echo: EF 45-50%; c. 09/2022 Echo: EF 55-60%.   Long-term use of aspirin  therapy    Nephrolithiasis    PEA (Pulseless electrical activity) (HCC) 04/05/2023   a.) witnessed arrest at [facility] and CPR initiated immediately --> ROSC achieved --> EMS transferred to [facility] with multiple episodes of PEA arrest en route (further CPR required) --> ROSC achieved in ED with further muliple epsides of recurrent PEA arrest lasting secs-mins --> TCP applied and patient taken to cath lab for [facility] and TVP placement   Port-A-Cath in place    Pulmonary embolism (HCC) 04/15/2023   a.) CTA chest 04/15/2023: extensive clot burden with an occlusive clot in the RIGHT upper lobe anterior and posterior segmental arteries, and a non occlusive thrombus in  the RIGHT lower lobe main artery and superior and posterior basal segmental arteries --> required thrombectomy   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) LIMA-LAD, SVG-OM, SVG-PDA   Shingles 2018   Status post bilateral cataract extraction    STEMI (ST elevation myocardial infarction) (HCC) 08/27/2017   a.) LHC 08/27/2017: 80% o-dLM, 100% lPAD, 95% o-pLCx, 60% pRCA, 90% mRCA, 80% oRPDA-RPDA --> unable to cross lesions with wire --> IABP placed --> CVTS consulted and patient transferred to [facility]; b.) s/p 3v CABG 08/27/2017: LIMA-LAD, SVG-OM, SVG-RPDA   Syncope  12/31/2023   Thrombocytopenia    Type 2 diabetes mellitus without complications (HCC) 06/07/2023   Vertigo     Assessment: Patient Reported Symptoms: Cognitive Cognitive Status: No symptoms reported, Normal speech and language skills, Alert and oriented to person, place, and time      Neurological Neurological Review of Symptoms: No symptoms reported    HEENT        Cardiovascular Cardiovascular Symptoms Reported: Dizziness, Other: (patient states he has positional vertigo but it only lasts a few seconds) Other Cardiovascular Symptoms: Patient states swelling hasn't been bad lately    Respiratory Respiratory Symptoms Reported: No symptoms reported Other Respiratory Symptoms: patient reports his O2 level is 97-99% on room air Respiratory Self-Management Outcome: 4 (good)  Endocrine Endocrine Symptoms Reported: No symptoms reported Endocrine Comment: patient takes Levothyroxicin  Gastrointestinal Gastrointestinal Symptoms Reported: No symptoms reported      Genitourinary Genitourinary Symptoms Reported: No symptoms reported    Integumentary Integumentary Symptoms Reported: Incision Additional Integumentary Details: patient reports pacemaker incision looks good and home health nurse looked at it and states steri strips are still in tact and next appt is 01/16/24  - Patient also states he still has hematoma for left inguinal hernia repair from 12/05/23 and surgeon moved up appt to 01/26/24    Musculoskeletal Musculoskelatal Symptoms Reviewed: No symptoms reported   Falls in the past year?: No    Psychosocial Psychosocial Symptoms Reported: No symptoms reported         There were no vitals filed for this visit. Pain Scale: 0-10 Pain Score: 5  Pain Type: Chronic pain Pain Location: Groin Pain Orientation: Left Pain Descriptors / Indicators: Aching Pain Onset: On-going (patient reports pain from left inguinal hernia repair 12/05/23 and states he has Tramadol  and follow up  appt 02/15/24) Patients Stated Pain Goal: 1 Pain Intervention(s): Hot/Cold interventions  Medications Reviewed Today     Reviewed by Lauro Shona LABOR, RN (Registered Nurse) on 01/12/24 at 1503  Med List Status: <None>   Medication Order Taking? Sig Documenting Provider Last Dose Status Informant  acetaminophen  (TYLENOL ) 500 MG tablet [id number] Yes Take 1,000 mg by mouth in the morning and at bedtime. [provider]  Active Self, Pharmacy Records  apixaban  (ELIQUIS ) 5 MG TABS tablet [id number] Yes Take 1 tablet (5 mg total) by mouth 2 (two) times daily. Marinda Jayson KIDD, MD  Active Self, Pharmacy Records  aspirin  81 MG chewable tablet [id number] Yes Place 1 tablet (81 mg total) into feeding tube daily. Marinda Jayson KIDD, MD  Active Self, Pharmacy Records  atorvastatin  (LIPITOR ) 80 MG tablet [id number] Yes Take 1 tablet (80 mg total) by mouth daily. Towana Small, FNP  Active   Blood Pressure Monitoring (BLOOD PRESSURE KIT) KIT [id number] Yes 1 each by Does not apply route in the morning, at noon, and at bedtime. Towana Small, FNP  Active Self, Pharmacy Records  famotidine  (PEPCID ) 20 MG tablet [id number] Yes  Take 1 tablet (20 mg total) by mouth daily. Towana Small, FNP  Active   Heparin  (Porcine) in NaCl 1000-0.9 UT/500ML-% SOLN [id number]   Dew, Jason S, MD  Active   Heparin  (Porcine) in NaCl 2000-0.9 UNIT/L-% SOLN [id number]   Marea Selinda RAMAN, MD  Active   iodixanol  (VISIPAQUE ) 320 MG/ML injection [id number]   Marea Selinda RAMAN, MD  Active   levothyroxine  (SYNTHROID ) 100 MCG tablet [id number] Yes Take 1 tablet (100 mcg total) by mouth daily before breakfast. Brahmanday, Govinda R, MD  Active Self, Pharmacy Records           Med Note CATHY OVAL DEL   Dju Dec 31, 2023  3:59 PM)    lidocaine -EPINEPHrine  (PF) (XYLOCAINE -EPINEPHrine ) 1 %-1:200000 (PF) injection [id number]   Marea Selinda RAMAN, MD  Active   metoprolol  succinate (TOPROL -XL) 25 MG 24 hr tablet [id number] Yes Take 1 tablet (25 mg  total) by mouth daily. Aniceto Daphne CROME, NP  Active   Multiple Vitamin (MULTIVITAMIN WITH MINERALS) TABS tablet [id number] Yes Take 1 tablet by mouth daily. [provider]  Active Self, Pharmacy Records  traMADol  (ULTRAM ) 50 MG tablet [id number] Yes Take 1 tablet (50 mg total) by mouth every 6 (six) hours as needed. Marinda Jayson KIDD, MD  Active             Recommendation:   Continue Current Plan of Care  Follow Up Plan:   Telephone follow up appointment date/time:  01/27/24 in the afternoon  Shona Prow RN, CCM [place]  VBCI-Population Health RN Care Manager [phone]

## 2024-01-13 ENCOUNTER — Telehealth: Payer: Self-pay

## 2024-01-13 NOTE — Patient Instructions (Incomplete)
OK to shower Let warm soapy water run down incision Do not scrub incision Pat dry with towel  Keep incision open to air - no ointments, creams, salves, or bandages.  Call device clinic if incision opens, has drainage, or begins to hurt more.

## 2024-01-13 NOTE — Telephone Encounter (Signed)
 Copied from CRM [id number]. Topic: General - Other >> Jan 13, 2024  1:23 PM Tysheama G wrote: Reason for CRM: Patient Occupational therapist stated he missed his appointment for today and will see him again next week as long as he doesn't miss it.

## 2024-01-13 NOTE — Progress Notes (Unsigned)

## 2024-01-15 ENCOUNTER — Telehealth: Payer: Self-pay | Admitting: Cardiology

## 2024-01-15 ENCOUNTER — Other Ambulatory Visit (HOSPITAL_COMMUNITY): Payer: Self-pay

## 2024-01-15 NOTE — Telephone Encounter (Signed)
 Received outpatient call from patient stating that he is unsure whether he took his metoprolol  XL 25mg  this morning.  States he has a pillbox but not currently using it.  This afternoon reports his blood pressures have fluctuated ranging from 155/95 to 125/73 with heart rate range of 87 up to 155.  States he felt dizzy when his heart rates were significantly elevated but currently rates in the 80s and asymptomatic.  I advised okay for him to take metoprolol  XL 25 mg now and follow his heart rate and blood pressure. Patient with recent PPM placement in the setting of complete heart block./tachy-brady syndrome.  Will route to EP provider, needs device interrogation to determine underlying rhythm.  Of note patient is anticoagulated with Eliquis  5 mg twice daily in the setting of prior history of PE.  Advised if he develops recurrent dizziness with elevated heart rates or low blood pressure he needs to seek evaluation in the ED.  Patient voiced understanding and thanked me for call back.

## 2024-01-16 ENCOUNTER — Ambulatory Visit: Attending: Cardiology | Admitting: Cardiology

## 2024-01-16 DIAGNOSIS — Z950 Presence of cardiac pacemaker: Secondary | ICD-10-CM | POA: Insufficient documentation

## 2024-01-16 DIAGNOSIS — I495 Sick sinus syndrome: Secondary | ICD-10-CM | POA: Insufficient documentation

## 2024-01-16 LAB — CUP PACEART INCLINIC DEVICE CHECK
Date Time Interrogation Session: 20251124103622
Implantable Lead Connection Status: 753985
Implantable Lead Connection Status: 753985
Implantable Lead Implant Date: 20251111
Implantable Lead Implant Date: 20251111
Implantable Lead Location: 753859
Implantable Lead Location: 753860
Implantable Lead Model: 7841
Implantable Lead Model: 7842
Implantable Lead Serial Number: 1531518
Implantable Lead Serial Number: 1677909
Implantable Pulse Generator Implant Date: 20251111
Lead Channel Impedance Value: 619 Ohm
Lead Channel Impedance Value: 624 Ohm
Lead Channel Pacing Threshold Amplitude: 1 V
Lead Channel Pacing Threshold Amplitude: 1.1 V
Lead Channel Pacing Threshold Pulse Width: 0.4 ms
Lead Channel Pacing Threshold Pulse Width: 0.4 ms
Lead Channel Sensing Intrinsic Amplitude: 2.3 mV
Lead Channel Sensing Intrinsic Amplitude: 25 mV
Lead Channel Setting Pacing Amplitude: 3.5 V
Lead Channel Setting Pacing Amplitude: 3.5 V
Lead Channel Setting Pacing Pulse Width: 0.4 ms
Lead Channel Setting Sensing Sensitivity: 2.5 mV
Pulse Gen Serial Number: 195779
Zone Setting Status: 755011

## 2024-01-18 ENCOUNTER — Encounter: Payer: Self-pay | Admitting: Internal Medicine

## 2024-01-18 ENCOUNTER — Ambulatory Visit: Admitting: Cardiology

## 2024-01-18 ENCOUNTER — Ambulatory Visit

## 2024-01-18 DIAGNOSIS — E039 Hypothyroidism, unspecified: Secondary | ICD-10-CM | POA: Diagnosis not present

## 2024-01-19 LAB — TSH+T4F+T3FREE
Free T4: 1.43 ng/dL (ref 0.82–1.77)
T3, Free: 2.9 pg/mL (ref 2.0–4.4)
TSH: 8.6 u[IU]/mL — ABNORMAL HIGH (ref 0.450–4.500)

## 2024-01-23 ENCOUNTER — Telehealth: Payer: Self-pay | Admitting: General Surgery

## 2024-01-23 ENCOUNTER — Other Ambulatory Visit: Payer: Self-pay | Admitting: General Surgery

## 2024-01-23 MED ORDER — OXYCODONE HCL 5 MG PO TABS: 5.0000 mg | ORAL_TABLET | Freq: Three times a day (TID) | ORAL | 0 refills | Status: DC | PRN

## 2024-01-23 NOTE — Telephone Encounter (Signed)
 Incoming call from patient.  He last saw in office on 12/29/23 post op left inguinal hernia repair done on 12/05/23 by Dr. Marinda.  Patient states that he is still having a lot of pain, there seems to be a knot in the area now. He is requesting pain med with Oxycodone . States the Ultram  doesn't work, patient states it makes him have indigestion and very foggy feeling the next day.  Wants to know if can get Oxy instead. Patient uses [facility] pharmacy [address]   Please call patient to advise.

## 2024-01-26 ENCOUNTER — Ambulatory Visit: Admitting: General Surgery

## 2024-01-26 ENCOUNTER — Encounter: Payer: Self-pay | Admitting: General Surgery

## 2024-01-26 VITALS — BP 145/68 | HR 70 | Ht 72.0 in | Wt 210.0 lb

## 2024-01-26 DIAGNOSIS — K4090 Unilateral inguinal hernia, without obstruction or gangrene, not specified as recurrent: Secondary | ICD-10-CM

## 2024-01-26 NOTE — Patient Instructions (Signed)
 You may use heat or ice to the area which ever is more comfortable.    Follow-up with our office in 2 months.   Please call and ask to speak with a nurse if you develop questions or concerns.

## 2024-01-26 NOTE — Progress Notes (Signed)
 Outpatient Surgical Follow Up  01/26/2024  Cory Hess is an 78 y.o. male.   Chief Complaint  Patient presents with   Routine Post Op    HPI: Patient is status post left inguinal hernia.  This was complicated by postoperative hematoma.  Since then he reports doing well.  He says he still has some swelling to the area and some pain to this.  He says the bruising is improved.  Past Medical History:  Diagnosis Date   Abscess of right arm 03/2023   pt states had absess to right arm after iv was inserted. Pt states he had a wound vac for 2 weeks   Acute kidney injury 04/06/2023   Acute on chronic HFrEF (heart failure with reduced ejection fraction) (HCC) 04/05/2023   Acute respiratory failure with hypoxia and hypercapnia (HCC) 04/10/2023   Alternating RBBB & LBBB    Ankle swelling 2024   Anticoagulated on apixaban     Aortic atherosclerosis    Arthritis of neck    Bilateral inguinal hernia    Bladder tumor    BPH (benign prostatic hyperplasia)    CAD (coronary artery disease)    a. 08/2017 STEMI/Cath: LM 80ost/d, LAD 100p, LCX 95 ost/p, RCA 60p, 33m, RPDA 80ost; b. 08/2017 CABG x 3: LIMA->LAD, VG->OM, VG->PDA; c. 03/2023 Cardiac Arrest/Cath: Occluded VG->RPDA, patent LIMA->LAD and VG->pLCX. No new native dzs->Med rx. EF 25-35%.   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   CVA (cerebral vascular accident) (HCC) 04/08/2023   a.) brain MRI 04/08/2023: 2 small acute infarctions affecting the cortical brain in the RIGHT frontal region.   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 08/2017 TEE: EF 30-35%, sept/ant/inf HK, apical AK. 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 12/2017 Echo: EF 45-50%, diff HK; c. 09/2022 Echo: EF 55-60%; d. 03/2023 Echo (in settng of PE): EF 40-45%, GrI DD (imaging poor); e. 03/2023 Echo w/ definity : EF 55-60%, nl RV fxn.   Hyperlipidemia LDL goal <70    Hypothyroidism    Ischemic cardiomyopathy    a. 08/2017 TEE: EF 30-35%; b. 12/2017 Echo: EF 45-50%; c. 09/2022 Echo: EF 55-60%.   Long-term use of aspirin  therapy    Nephrolithiasis    PEA (Pulseless electrical activity) (HCC) 04/05/2023   a.) witnessed arrest at [facility] and CPR initiated immediately --> ROSC achieved --> EMS transferred to [facility] with multiple episodes of PEA arrest en route (further CPR required) --> ROSC achieved in ED with further muliple epsides of recurrent PEA arrest lasting secs-mins --> TCP applied and patient taken to cath lab for 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) LIMA-LAD, SVG-OM, SVG-PDA   Shingles 2018   Status post bilateral cataract extraction    STEMI (ST elevation myocardial infarction) (HCC) 08/27/2017   a.) LHC 08/27/2017: 80% o-dLM, 100% lPAD, 95% o-pLCx, 60% pRCA, 90% mRCA, 80% oRPDA-RPDA --> unable to cross lesions with wire --> IABP placed --> CVTS consulted and patient transferred to [facility]; b.) s/p 3v CABG 08/27/2017: LIMA-LAD, SVG-OM, SVG-RPDA   Syncope 12/31/2023   Thrombocytopenia    Type 2 diabetes mellitus without complications (HCC)  06/07/2023   Vertigo     Past Surgical History:  Procedure Laterality Date   CARDIAC CATHETERIZATION     CAROTID PTA/STENT INTERVENTION Right 10/26/2023   Procedure: CAROTID PTA/STENT INTERVENTION;  Surgeon: Marea Selinda RAMAN, MD;  Location: [facility] INVASIVE CV LAB;  Service: Cardiovascular;  Laterality: Right;   CATARACT EXTRACTION W/PHACO Left 07/18/2018   Procedure: CATARACT EXTRACTION PHACO AND INTRAOCULAR LENS PLACEMENT (IOC)  LEFT;  Surgeon: Myrna Adine Anes, MD;  Location: [facility];  Service: Ophthalmology;  Laterality: Left;   CATARACT EXTRACTION W/PHACO Right 08/14/2018   Procedure: CATARACT EXTRACTION PHACO AND INTRAOCULAR LENS PLACEMENT (IOC)  RIGHT;  Surgeon: Myrna Adine Anes, MD;  Location: [facility];  Service: Ophthalmology;  Laterality: Right;   CORONARY ARTERY BYPASS GRAFT N/A 08/27/2017   Procedure: CORONARY ARTERY BYPASS GRAFTING (CABG) ON PUMP USING LEFT INTERNAL MAMMARY ARTERY AND LEFT GREATER SAPHENOUS VEIN VIA ENDOVEIN HARVEST;  Surgeon: Lucas Dorise POUR, MD;  Location: [facility] OR;  Service: Open Heart Surgery;  Laterality: N/A;   CORONARY/GRAFT ACUTE MI REVASCULARIZATION N/A 08/27/2017   Procedure: Coronary/Graft Acute MI Revascularization;  Surgeon: Darron Deatrice LABOR, MD;  Location: [facility] INVASIVE CV LAB;  Service: Cardiovascular;  Laterality: N/A;   CYSTOSCOPY W/ RETROGRADES Bilateral 12/25/2018   Procedure: CYSTOSCOPY WITH RETROGRADE PYELOGRAM;  Surgeon: Penne Knee, MD;  Location: [facility] ORS;  Service: Urology;  Laterality: Bilateral;   FRACTURE SURGERY Right 1958   arm and left wrist compound fracture, no metal   HERNIA REPAIR Right    inguinial   INGUINAL HERNIA REPAIR Left 12/05/2023   Procedure: REPAIR, HERNIA, INGUINAL, ADULT;  Surgeon: Marinda Jayson KIDD, MD;  Location: [facility] ORS;  Service: General;  Laterality: Left;  open procedure with mesh   INSERTION OF MESH  12/05/2023   Procedure: INSERTION OF MESH;  Surgeon: Marinda Jayson KIDD, MD;  Location:  [facility] ORS;  Service: General;;   IR IMAGING GUIDED PORT INSERTION  10/15/2022   LEFT HEART CATH AND CORONARY ANGIOGRAPHY N/A 08/27/2017   Procedure: LEFT HEART CATH AND CORONARY ANGIOGRAPHY;  Surgeon: Darron Deatrice LABOR, MD;  Location: [facility] INVASIVE CV LAB;  Service: Cardiovascular;  Laterality: N/A;   PACEMAKER IMPLANT N/A 01/03/2024   Procedure: PACEMAKER IMPLANT;  Surgeon: Cindie Ole DASEN, MD;  Location: [facility] INVASIVE CV LAB;  Service: Cardiovascular;  Laterality: N/A;   PULMONARY THROMBECTOMY Bilateral 04/18/2023   Procedure: PULMONARY THROMBECTOMY;  Surgeon: Marea Selinda RAMAN, MD;  Location: [facility] INVASIVE CV LAB;  Service: Cardiovascular;  Laterality: Bilateral;   RIGHT/LEFT HEART CATH AND CORONARY/GRAFT ANGIOGRAPHY N/A 04/05/2023   Procedure: RIGHT/LEFT HEART CATH AND CORONARY/GRAFT ANGIOGRAPHY;  Surgeon: Mady Bruckner, MD;  Location: [facility] INVASIVE CV LAB;  Service: Cardiovascular;  Laterality: N/A;   TEMPORARY PACEMAKER N/A 04/05/2023   Procedure: TEMPORARY PACEMAKER;  Surgeon: Mady Bruckner, MD;  Location: [facility] INVASIVE CV  LAB;  Service: Cardiovascular;  Laterality: N/A;   TEMPORARY PACEMAKER N/A 12/10/2023   Procedure: TEMPORARY PACEMAKER;  Surgeon: Florencio Cara BIRCH, MD;  Location: [facility] INVASIVE CV LAB;  Service: Cardiovascular;  Laterality: N/A;   TRANSURETHRAL RESECTION OF BLADDER TUMOR WITH MITOMYCIN -C N/A 12/25/2018   Procedure: TRANSURETHRAL RESECTION OF BLADDER TUMOR WITH Gemcitabine ;  Surgeon: Penne Knee, MD;  Location: [facility] ORS;  Service: Urology;  Laterality: N/A;    Family History  Problem Relation Age of Onset   Heart failure Mother    Lung disease Father    Stroke Father    Cervical cancer Maternal Grandmother     Social History:  reports that he quit smoking about 25 years ago. His smoking use included cigarettes. He has been exposed to tobacco smoke. He has quit using smokeless tobacco.  His smokeless tobacco use included snuff. He reports that he does not  currently use alcohol. He reports that he does not currently use drugs.  Allergies: No Known Allergies  Medications reviewed.    ROS Full ROS performed and is otherwise negative other than what is stated in HPI   BP (!) 145/68   Pulse 70   Ht 6' (1.829 m)   Wt 210 lb (95.3 kg)   SpO2 96%   BMI 28.48 kg/m   Physical Exam Left groin still with some induration underneath the incision and swelling.  On hernia exam there is no evidence of recurrence with Valsalva.  No erythema or bruising around the exam area.    No results found for this or any previous visit (from the past 48 hours). No results found.  Assessment/Plan:  Patient status post left inguinal hernia repair.  He did have a postoperative hematoma but that has resolved.  He continues to have some induration tissue.  I encouraged him to continue massaging the area.  We will see him again in 2 months and if still having swelling we can work this up with a CT scan.  Jayson Endow, M.D. [facility]

## 2024-01-27 ENCOUNTER — Ambulatory Visit: Payer: Self-pay | Admitting: Family Medicine

## 2024-01-30 ENCOUNTER — Telehealth: Payer: Self-pay | Admitting: Pulmonary Disease

## 2024-01-30 ENCOUNTER — Telehealth: Payer: Self-pay

## 2024-01-30 MED ORDER — METOPROLOL SUCCINATE ER 25 MG PO TB24: 25.0000 mg | ORAL_TABLET | Freq: Every day | ORAL | 3 refills | Status: DC

## 2024-01-30 NOTE — Telephone Encounter (Signed)
 Refill sent

## 2024-01-30 NOTE — Telephone Encounter (Signed)
*  STAT* If patient is at the pharmacy, call can be transferred to refill team.   1. Which medications need to be refilled? (please list name of each medication and dose if known)   metoprolol  succinate (TOPROL -XL) 25 MG 24 hr tablet   2. Would you like to learn more about the convenience, safety, & potential cost savings by using the [facility]? no   3. Are you open to using the [facility] (Type [facility]. no   4. Which pharmacy/location (including street and city if local pharmacy) is medication to be sent to? [id number] - [place], [place] - [address] [address]      5. Do they need a 30 day or 90 day supply? 90 day   Appt. Schedule 1/26 3:00

## 2024-02-03 ENCOUNTER — Telehealth: Payer: Self-pay

## 2024-02-03 NOTE — Telephone Encounter (Signed)
 Copied from CRM #[id number]. Topic: General - Other >> Feb 03, 2024  3:00 PM Tysheama G wrote: Reason for CRM: Lacie a skilled nursed is calling to report a missed skilled nursing appointment with the patient and she has not been able to reach him this week and she has called multiple times. Callback number [phone]

## 2024-02-08 ENCOUNTER — Telehealth: Payer: Self-pay | Admitting: Family Medicine

## 2024-02-08 NOTE — Telephone Encounter (Signed)
 Copied from CRM #[id number]. Topic: Clinical - Home Health Verbal Orders >> Feb 07, 2024  4:30 PM Delon DASEN wrote: Caller/Agency: Zacharia with [facility] Callback Number: [phone] Service Requested: Occupational Therapy Frequency: OT eval only Any new concerns about the patient? No patient is walking around independently with no issues so no concern for OT

## 2024-02-09 ENCOUNTER — Encounter: Admitting: General Surgery

## 2024-02-14 ENCOUNTER — Ambulatory Visit

## 2024-02-14 DIAGNOSIS — I495 Sick sinus syndrome: Secondary | ICD-10-CM | POA: Diagnosis not present

## 2024-02-14 LAB — CUP PACEART REMOTE DEVICE CHECK
Battery Remaining Longevity: 144 mo
Battery Remaining Percentage: 100 %
Brady Statistic RA Percent Paced: 13 %
Brady Statistic RV Percent Paced: 1 %
Date Time Interrogation Session: 20251223014200
Implantable Lead Connection Status: 753985
Implantable Lead Connection Status: 753985
Implantable Lead Implant Date: 20251111
Implantable Lead Implant Date: 20251111
Implantable Lead Location: 753859
Implantable Lead Location: 753860
Implantable Lead Model: 7841
Implantable Lead Model: 7842
Implantable Lead Serial Number: 1531518
Implantable Lead Serial Number: 1677909
Implantable Pulse Generator Implant Date: 20251111
Lead Channel Impedance Value: 635 Ohm
Lead Channel Impedance Value: 665 Ohm
Lead Channel Pacing Threshold Amplitude: 0.7 V
Lead Channel Pacing Threshold Amplitude: 0.7 V
Lead Channel Pacing Threshold Pulse Width: 0.4 ms
Lead Channel Pacing Threshold Pulse Width: 0.4 ms
Lead Channel Setting Pacing Amplitude: 3.5 V
Lead Channel Setting Pacing Amplitude: 3.5 V
Lead Channel Setting Pacing Pulse Width: 0.4 ms
Lead Channel Setting Sensing Sensitivity: 2.5 mV
Pulse Gen Serial Number: 195779
Zone Setting Status: 755011

## 2024-02-15 NOTE — Progress Notes (Signed)
 Remote PPM Transmission

## 2024-02-19 ENCOUNTER — Ambulatory Visit: Payer: Self-pay | Admitting: Cardiology

## 2024-02-20 ENCOUNTER — Telehealth: Payer: Self-pay | Admitting: Family Medicine

## 2024-02-20 NOTE — Telephone Encounter (Signed)
 Attempted to reach patient to discuss any need for transportation per KB.  Patient's mail box was full unable to reach and/or leave a message.Mail box full.

## 2024-02-21 NOTE — Telephone Encounter (Signed)
 Call x1; lvmtcb to schedule patient with device clinic to Adjust atrial sensitivity or PVAB to prevent future episodes. See Result Note 02/14/24

## 2024-02-21 NOTE — Telephone Encounter (Signed)
 Spoke w/ patient - he is scheduled to see Device Clinic on 1/7.

## 2024-02-22 ENCOUNTER — Other Ambulatory Visit: Payer: Self-pay | Admitting: Family Medicine

## 2024-02-22 DIAGNOSIS — E039 Hypothyroidism, unspecified: Secondary | ICD-10-CM

## 2024-02-22 NOTE — Telephone Encounter (Unsigned)
 Copied from CRM #[id number]. Topic: Clinical - Medication Refill >> Feb 22, 2024  3:58 PM Winona R wrote: Medication: levothyroxine  (SYNTHROID ) 100 MCG tablet [[id number]]   Has the patient contacted their pharmacy? No (Agent: If no, request that the patient contact the pharmacy for the refill. If patient does not wish to contact the pharmacy document the reason why and proceed with request.) (Agent: If yes, when and what did the pharmacy advise?)  This is the patient's preferred pharmacy:  [facility] [address], [place] - [address] [place] [address] Phone: [phone] Fax: [phone]  Is this the correct pharmacy for this prescription? Yes If no, delete pharmacy and type the correct one.   Has the prescription been filled recently? Yes  Is the patient out of the medication? Yes  Has the patient been seen for an appointment in the last year OR does the patient have an upcoming appointment? Yes  Can we respond through MyChart? no  Agent: Please be advised that Rx refills may take up to 3 business days. We ask that you follow-up with your pharmacy.

## 2024-02-24 ENCOUNTER — Telehealth: Payer: Self-pay

## 2024-02-24 ENCOUNTER — Other Ambulatory Visit: Payer: Self-pay | Admitting: Internal Medicine

## 2024-02-24 DIAGNOSIS — E039 Hypothyroidism, unspecified: Secondary | ICD-10-CM

## 2024-02-24 NOTE — Telephone Encounter (Signed)
 Copied from CRM #[id number]. Topic: Clinical - Medication Refill >> Feb 22, 2024  3:58 PM Winona R wrote: Medication: levothyroxine  (SYNTHROID ) 100 MCG tablet [[id number]]   Has the patient contacted their pharmacy? No (Agent: If no, request that the patient contact the pharmacy for the refill. If patient does not wish to contact the pharmacy document the reason why and proceed with request.) (Agent: If yes, when and what did the pharmacy advise?)  This is the patient's preferred pharmacy:  [facility] [address] [place] - [address] [place] [address] Phone: [phone] Fax: [phone]  Is this the correct pharmacy for this prescription? Yes If no, delete pharmacy and type the correct one.   Has the prescription been filled recently? Yes  Is the patient out of the medication? Yes  Has the patient been seen for an appointment in the last year OR does the patient have an upcoming appointment? Yes  Can we respond through MyChart? no  Agent: Please be advised that Rx refills may take up to 3 business days. We ask that you follow-up with your pharmacy. >> Feb 24, 2024  1:17 PM Avram MATSU wrote: Patient called about his rx was informed its still pending.    [facility] [address] [place] - [address]  [address] [place] [address]  Phone: [phone] Fax: [phone]

## 2024-02-27 NOTE — Telephone Encounter (Signed)
 Refills sent 02/24/24

## 2024-02-27 NOTE — Telephone Encounter (Signed)
 Sent 02/24/2024

## 2024-02-29 ENCOUNTER — Ambulatory Visit

## 2024-02-29 DIAGNOSIS — I495 Sick sinus syndrome: Secondary | ICD-10-CM

## 2024-02-29 LAB — CUP PACEART INCLINIC DEVICE CHECK
Date Time Interrogation Session: 20260107155844
Implantable Lead Connection Status: 753985
Implantable Lead Connection Status: 753985
Implantable Lead Implant Date: 20251111
Implantable Lead Implant Date: 20251111
Implantable Lead Location: 753859
Implantable Lead Location: 753860
Implantable Lead Model: 7841
Implantable Lead Model: 7842
Implantable Lead Serial Number: 1531518
Implantable Lead Serial Number: 1677909
Implantable Pulse Generator Implant Date: 20251111
Lead Channel Impedance Value: 632 Ohm
Lead Channel Impedance Value: 685 Ohm
Lead Channel Sensing Intrinsic Amplitude: 2.6 mV
Lead Channel Sensing Intrinsic Amplitude: 25 mV
Lead Channel Setting Pacing Amplitude: 3.5 V
Lead Channel Setting Pacing Amplitude: 3.5 V
Lead Channel Setting Pacing Pulse Width: 0.4 ms
Lead Channel Setting Sensing Sensitivity: 2.5 mV
Pulse Gen Serial Number: 195779
Zone Setting Status: 755011

## 2024-02-29 NOTE — Patient Instructions (Signed)
 Follow up as scheduled.

## 2024-02-29 NOTE — Progress Notes (Signed)
 Full device check not complete. Patient brought in acutely per Dr. Kennyth d/t [facility] noted on remote transmission. Presenting rhythm AS/VS 71 w/ no FFOS noted. No episodes noted. 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   Events show VA FFOS measuring at . Initial programming PVAB after VP is . Initial programming PVAB after VS is 85ms. Initial atrial sensitivity programmed at 0.75mV.   Atrial sensitivity programmed to 0.4mV w/ no FFOS noted during check. Patient to F/U w/ Suzann on 04/11/2024. Will continue to monitor and update accoridngly.   Programming Changes: - Atrial sensitivity reprogrammed from 0.72mV to 0.53mV to cover up FFOS.

## 2024-03-02 ENCOUNTER — Telehealth: Payer: Self-pay | Admitting: Family Medicine

## 2024-03-02 NOTE — Telephone Encounter (Signed)
 Copy of DMV placard disability from saved to file in back office

## 2024-03-02 NOTE — Telephone Encounter (Signed)
 Patient walked in and picked up DMV application for disability parking  Placard.  Copy scanned to Onbase.

## 2024-03-12 ENCOUNTER — Ambulatory Visit: Payer: Self-pay | Admitting: Cardiology

## 2024-03-19 ENCOUNTER — Ambulatory Visit: Admitting: Cardiovascular Disease

## 2024-03-22 ENCOUNTER — Encounter: Payer: Self-pay | Admitting: Nurse Practitioner

## 2024-03-22 ENCOUNTER — Ambulatory Visit: Admitting: Nurse Practitioner

## 2024-03-22 VITALS — BP 158/72 | HR 70 | Ht 72.0 in | Wt 220.0 lb

## 2024-03-22 DIAGNOSIS — I255 Ischemic cardiomyopathy: Secondary | ICD-10-CM | POA: Insufficient documentation

## 2024-03-22 DIAGNOSIS — I442 Atrioventricular block, complete: Secondary | ICD-10-CM | POA: Diagnosis present

## 2024-03-22 DIAGNOSIS — I2699 Other pulmonary embolism without acute cor pulmonale: Secondary | ICD-10-CM | POA: Diagnosis present

## 2024-03-22 DIAGNOSIS — I495 Sick sinus syndrome: Secondary | ICD-10-CM | POA: Insufficient documentation

## 2024-03-22 DIAGNOSIS — I10 Essential (primary) hypertension: Secondary | ICD-10-CM | POA: Insufficient documentation

## 2024-03-22 DIAGNOSIS — K219 Gastro-esophageal reflux disease without esophagitis: Secondary | ICD-10-CM | POA: Diagnosis present

## 2024-03-22 DIAGNOSIS — I6521 Occlusion and stenosis of right carotid artery: Secondary | ICD-10-CM | POA: Insufficient documentation

## 2024-03-22 DIAGNOSIS — I5020 Unspecified systolic (congestive) heart failure: Secondary | ICD-10-CM | POA: Diagnosis present

## 2024-03-22 DIAGNOSIS — I25810 Atherosclerosis of coronary artery bypass graft(s) without angina pectoris: Secondary | ICD-10-CM | POA: Insufficient documentation

## 2024-03-22 MED ORDER — METOPROLOL SUCCINATE ER 50 MG PO TB24: 50.0000 mg | ORAL_TABLET | Freq: Every day | ORAL | 3 refills | Status: AC

## 2024-03-22 MED ORDER — FAMOTIDINE 20 MG PO TABS: 20.0000 mg | ORAL_TABLET | Freq: Every day | ORAL | 3 refills | Status: AC

## 2024-03-22 NOTE — Patient Instructions (Signed)
 Medication Instructions:  INCREASE the Metoprolol  Succinate 50 mg once daily *If you need a refill on your cardiac medications before your next appointment, please call your pharmacy*  Lab Work: None ordered If you have labs (blood work) drawn today and your tests are completely normal, you will receive your results only by: MyChart Message (if you have MyChart) OR A paper copy in the mail If you have any lab test that is abnormal or we need to change your treatment, we will call you to review the results.  Testing/Procedures: None ordered  Follow-Up: At [facility], you and your health needs are our priority.  As part of our continuing mission to provide you with exceptional heart care, our providers are all part of one team.  This team includes your primary Cardiologist (physician) and Advanced Practice Providers or APPs (Physician Assistants and Nurse Practitioners) who all work together to provide you with the care you need, when you need it.  Your next appointment:   6 month(s)  Provider:   Lonni Hanson, MD    We recommend signing up for the patient portal called MyChart.  Sign up information is provided on this After Visit Summary.  MyChart is used to connect with patients for Virtual Visits (Telemedicine).  Patients are able to view lab/test results, encounter notes, upcoming appointments, etc.  Non-urgent messages can be sent to your provider as well.   To learn more about what you can do with MyChart, go to [email or web address].

## 2024-03-22 NOTE — Progress Notes (Signed)
 "    Office Visit    Patient Name: Cory Hess Date of Encounter: 03/22/2024  Primary Care Provider:  Towana Small, FNP Primary Cardiologist:  Lonni Hanson, MD  Cardiology APP:  Vivienne Lonni Ingle, NP   Chief Complaint    79 y.o. male with history of CAD status post CABG x 3 in July 2018, hypertension, hyperlipidemia, alternating bundle branch block, ischemic cardiomyopathy, HFrEF, and PE w/ PEA arrest in 03/2023, who presents for CAD and heart failure follow-up.   Past Medical History   Subjective   Past Medical History:  Diagnosis Date   Abscess of right arm 03/2023   pt states had absess to right arm after iv was inserted. Pt states he had a wound vac for 2 weeks   Acute kidney injury 04/06/2023   Acute on chronic HFrEF (heart failure with reduced ejection fraction) (HCC) 04/05/2023   Acute respiratory failure with hypoxia and hypercapnia (HCC) 04/10/2023   Alternating RBBB & LBBB    Ankle swelling 2024   Anticoagulated on apixaban     Aortic atherosclerosis    Arthritis of neck    Bilateral inguinal hernia    Bladder tumor    BPH (benign prostatic hyperplasia)    CAD (coronary artery disease)    a. 08/2017 STEMI/Cath: LM 80ost/d, LAD 100p, LCX 95 ost/p, RCA 60p, 80m, RPDA 80ost; b. 08/2017 CABG x 3: LIMA->LAD, VG->OM, VG->PDA; c. 03/2023 Cardiac Arrest/Cath: Occluded VG->RPDA, patent LIMA->LAD and VG->pLCX. No new native dzs->Med rx. EF 25-35%.   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 10/2023 Carotid angio: RICA 30-40%-->med rx.   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 resulted in multiple episodes of PEA arrest --> stabilized with TCP --> TVP placed during LHC; b. 10/2023 CHB in setting of bb  therapy; c. 12/2023 s/p BSX L331 Accolade MRI EL DC PPM.   CVA (cerebral vascular accident) (HCC) 04/08/2023   a.) brain MRI 04/08/2023: 2 small acute infarctions affecting the cortical brain in the RIGHT frontal region.   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 08/2017 TEE: EF 30-35%, sept/ant/inf HK, apical AK. 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 12/2017 Echo: EF 45-50%, diff HK; c. 09/2022 Echo: EF 55-60%; d. 03/2023 Echo (in settng of PE): EF 40-45%, GrI DD (imaging poor); e. 03/2023 Echo w/ definity : EF 55-60%, nl RV fxn; f. 11/2023 Echo: EF 55-60%, no rwma, mod conc LVH, GrI DD, nl RV fxn, trvi MR.   Hyperlipidemia LDL goal <70    Hypothyroidism    Ischemic cardiomyopathy    a. 08/2017 TEE: EF 30-35%; b. 12/2017 Echo: EF 45-50%; c. 09/2022 Echo: EF 55-60%; d. 11/2023 Echo: EF 55-60%.   Long-term use of aspirin  therapy    Nephrolithiasis    PEA (Pulseless electrical activity) (HCC) 04/05/2023   a.) witnessed arrest at [facility] and CPR initiated immediately --> ROSC achieved --> EMS transferred to [facility] with multiple episodes of PEA arrest en route (further CPR required) --> ROSC achieved in ED with further muliple epsides of  recurrent PEA arrest lasting secs-mins --> TCP applied and patient taken to cath lab for [facility] and TVP placement   Port-A-Cath in place    Pulmonary embolism (HCC) 04/15/2023   a.) CTA chest 04/15/2023: extensive clot burden with an occlusive clot in the RIGHT upper lobe anterior and posterior segmental arteries, and a non occlusive thrombus in the RIGHT lower lobe main artery and superior and posterior basal segmental arteries --> required thrombectomy   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 LIMA-LAD, SVG-OM, SVG-PDA   Shingles 2018   Status post bilateral cataract extraction    STEMI (ST elevation myocardial infarction) (HCC) 08/27/2017   a.) LHC  08/27/2017: 80% o-dLM, 100% lPAD, 95% o-pLCx, 60% pRCA, 90% mRCA, 80% oRPDA-RPDA --> unable to cross lesions with wire --> IABP placed --> CVTS consulted and patient transferred to [facility]; b.) s/p 3v CABG 08/27/2017: LIMA-LAD, SVG-OM, SVG-RPDA   Syncope 12/31/2023   Tachycardia-bradycardia syndrome (HCC)    a. 12/2023 s/p BSX L331 Accolade MRI EL DC PPM.   Thrombocytopenia    Type 2 diabetes mellitus without complications (HCC) 06/07/2023   Vertigo    Past Surgical History:  Procedure Laterality Date   CARDIAC CATHETERIZATION     CAROTID PTA/STENT INTERVENTION Right 10/26/2023   Carotid angio only - no stent placed   CATARACT EXTRACTION W/PHACO Left 07/18/2018   Procedure: CATARACT EXTRACTION PHACO AND INTRAOCULAR LENS PLACEMENT (IOC)  LEFT;  Surgeon: Myrna Adine Anes, MD;  Location: [facility];  Service: Ophthalmology;  Laterality: Left;   CATARACT EXTRACTION W/PHACO Right 08/14/2018   Procedure: CATARACT EXTRACTION PHACO AND INTRAOCULAR LENS PLACEMENT (IOC)  RIGHT;  Surgeon: Myrna Adine Anes, MD;  Location: [facility];  Service: Ophthalmology;  Laterality: Right;   CORONARY ARTERY BYPASS GRAFT N/A 08/27/2017   Procedure: CORONARY ARTERY BYPASS GRAFTING (CABG) ON PUMP USING LEFT INTERNAL MAMMARY ARTERY AND LEFT GREATER SAPHENOUS VEIN VIA ENDOVEIN HARVEST;  Surgeon: Lucas Dorise POUR, MD;  Location: [facility] OR;  Service: Open Heart Surgery;  Laterality: N/A;   CORONARY/GRAFT ACUTE MI REVASCULARIZATION N/A 08/27/2017   Procedure: Coronary/Graft Acute MI Revascularization;  Surgeon: Darron Deatrice LABOR, MD;  Location: [facility] INV[facility] CV LAB;  Service: Cardiovascular;  Laterality: N/A;   CYSTOSCOPY W/ RETROGRADES Bilateral 12/25/2018   Procedure: CYSTOSCOPY WITH RETROGRADE PYELOGRAM;  Surgeon: Penne Knee, MD;  Location: [facility] ORS;  Service: Urology;  Laterality: Bilateral;   FRACTURE SURGERY Right 1958   arm and left wrist compound fracture, no metal   HERNIA REPAIR Right     inguinial   INGUINAL HERNIA REPAIR Left 12/05/2023   Procedure: REPAIR, HERNIA, INGUINAL, ADULT;  Surgeon: Marinda Jayson KIDD, MD;  Location: [facility] OR[facility];  Service: General;  Laterality: Left;  open procedure with mesh   INSERTION OF MESH  12/05/2023   Procedure: INSERTION OF MESH;  Surgeon: Marinda Jayson KIDD, MD;  Location: [facility] OR[facility];  Service: General;;   IR IMAGING GUIDED PORT INSERTION  10/15/2022   LEFT HEART CATH AND CORONARY ANGIOGRAPHY N/A 08/27/2017   Procedure: LEFT HEART CATH AND CORONARY ANGIOGRAPHY;  Surgeon: Darron Deatrice LABOR, MD;  Location: [facility];  Service: Cardiovascular;  Laterality: N/A;   PACEMAKER IMPLANT N/A 01/03/2024   Procedure: PACEMAKER IMPLANT;  Surgeon: Cindie Ole DASEN, MD;  Location: [facility] INV[facility] CV LAB;  Service: Cardiovascular;  Laterality: N/A;   PULMONARY THROMBECTOMY Bilateral 04/18/2023   Procedure: PULMONARY THROMBECTOMY;  Surgeon: Marea Selinda RAMAN, MD;  Location: [facility];  Service: Cardiovascular;  Laterality: Bilateral;   RIGHT/LEFT HEART CATH AND CORONARY/GRAFT ANGIOGRAPHY N/A 04/05/2023   Procedure: RIGHT/LEFT HEART CATH AND CORONARY/GRAFT ANGIOGRAPHY;  Surgeon: Mady Bruckner, MD;  Location: [facility];  Service: Cardiovascular;  Laterality: N/A;   TEMPORARY PACEMAKER N/A 04/05/2023   Procedure: TEMPORARY PACEMAKER;  Surgeon: Mady Bruckner, MD;  Location: [facility];  Service: Cardiovascular;  Laterality: N/A;   TEMPORARY PACEMAKER N/A 12/10/2023   Procedure: TEMPORARY PACEMAKER;  Surgeon: Florencio Cara BIRCH, MD;  Location: [facility];  Service: Cardiovascular;  Laterality: N/A;   TRANSURETHRAL RESECTION OF BLADDER TUMOR WITH MITOMYCIN -C N/A 12/25/2018   Procedure: TRANSURETHRAL RESECTION OF BLADDER TUMOR WITH Gemcitabine ;  Surgeon: Penne Knee, MD;  Location: [facility] OR[facility];  Service: Urology;  Laterality: N/A;    Allergies  Allergies[1]     History of Present Illness      79 y.o. y/o male with  above past medical history including hypertension, hyperlipidemia, CAD, alternating bundle branch block, ischemic cardiomyopathy/HFrEF, and PE/PEA arrest 03/2023.  In July 2019, he was admitted with chest pain and new left bundle branch block.  Catheterization showed multivessel CAD and following intra-aortic balloon pump placement, he was transferred to [facility] underwent CABG x 3.  Intraoperative TEE showed an EF of 30 to 35%.  Follow-up echo November 2019, showed improvement in LV function to 45-50%.  He was maintained on Plavix  following his CABG however, this was discontinued in September 2020 in the setting of hematuria and finding of bladder tumor.  He subsequently underwent TURBT in November 2020.  Echocardiogram in August 2024 showed EF of 55 to 60% with moderate LVH and grade 1 diastolic dysfunction.  No significant valvular disease was noted.   In August 2024, he developed lower extremity edema and was placed on torsemide  therapy with improvement in edema.  At the time, he was also undergoing radiation and chemotherapy cancer at the base of his tongue.  Torsemide  was reduced to an as-needed basis.  In February 2025, patient was admitted with out-of-hospital PEA/cardiac arrest.  Emergent diagnostic catheterization revealed patent LIMA to the LAD and vein graft to the proximal left circumflex, with occlusion of the vein graft to the RPDA, native left main and LAD occlusion, proximal circumflex disease, and severe diffuse RCA disease.  EF was 25 to 35% by ventriculography.  Filling pressures were moderately to severely elevated (right greater than left) and in the setting of bradycardia, temporary wire was placed.  No intervention was required.  CTA of the chest was performed and showed an occlusive clot in the right upper lobe anterior and posterior segmental arteries with nonoccluding thrombus in the right lower lobe main artery and superior posterior basal segmental arteries.  He required  thrombectomy with subsequent development of Pseudomonas pneumonia and difficulty weaning from ventilator requiring placement of a tracheostomy.  Echocardiogram initially showed an EF of 40 to 45% though follow-up echo later in admission showed EF of 55 to 60% with normal RV function.  He was eventually discharged to long-term acute care facility on subcutaneous Lovenox .  He has very limited recollection of the events that occurred during his hospitalization and was unaware that he had a pulmonary embolism.  He was d/c'd from rehab on eliquis , but it was stopped at some point for unclear reasons and was subsequently resumed in June 2025.  His oncologist recommended at least six months of OAC.   CT of the soft tissue of the neck in 08/2023 suggested occlusive right internal carotid artery  disease.  He was subsequently seen by vascular surgery and underwent angiography in Sept 2025 showing 30 to 40% right internal carotid restenosis with minimal left carotid artery disease.  He subsequently underwent left open inguinal hernia repair with mesh in October 2025.  A week later, he developed a syncope that occurred while eating prompting hospital admission.  He developed complete heart block and ? blocker was held.  He subsequently required temporary wire placement.  Bradycardia resolved and he was discharged home with event monitoring.  Echocardiogram during admission showed normal LV function.  Mr. Dragone was readmitted in early November 2025 in the setting of presyncope.  Admission ECG suggestive of SVT/atrial tachycardia.  In the setting of tachybrady syndrome, he was seen by electrophysiology, transferred to [facility], and underwent successful [facility] dual-chamber permanent pacemaker placement.    Mr. Purdum has done well over the past few months.  He is active but not routinely exercising.  He denies chest pain, dyspnea, palpitations, PND, orthopnea, dizziness, syncope, edema, or early satiety.  He  sometimes checks his blood pressure at home and notes that it typically runs in the 135-150 range. Objective   Home Medications    Current Outpatient Medications  Medication Sig Dispense Refill   acetaminophen  (TYLENOL ) 500 MG tablet Take 1,000 mg by mouth in the morning and at bedtime.     apixaban  (ELIQUIS ) 5 MG TABS tablet Take 1 tablet (5 mg total) by mouth 2 (two) times daily.     aspirin  81 MG chewable tablet Place 1 tablet (81 mg total) into feeding tube daily.     atorvastatin  (LIPITOR ) 80 MG tablet Take 1 tablet (80 mg total) by mouth daily. 90 tablet 3   Blood Pressure Monitoring (BLOOD PRESSURE KIT) KIT 1 each by Does not apply route in the morning, at noon, and at bedtime. 1 kit 0   levothyroxine  (SYNTHROID ) 100 MCG tablet TAKE 1 TABLET BY MOUTH ONCE DAILY BEFORE  BREAKFAST 60 tablet 0   Multiple Vitamin (MULTIVITAMIN WITH MINERALS) TABS tablet Take 1 tablet by mouth daily.     oxyCODONE  (ROXICODONE ) 5 MG immediate release tablet Take 1 tablet (5 mg total) by mouth every 8 (eight) hours as needed. 10 tablet 0   famotidine  (PEPCID ) 20 MG tablet Take 1 tablet (20 mg total) by mouth daily. 30 tablet 3   metoprolol  succinate (TOPROL -XL) 50 MG 24 hr tablet Take 1 tablet (50 mg total) by mouth daily. 90 tablet 3   No current facility-administered medications for this visit.   Facility-Administered Medications Ordered in Other Visits  Medication Dose Route Frequency Provider Last Rate Last Admin   Heparin  (Porcine) in NaCl 1000-0.9 UT/500ML-% SOLN    PRN Dew, Jason S, MD   1,000 mL at 10/26/23 0834   Heparin  (Porcine) in NaCl 2000-0.9 UNIT/L-% SOLN    PRN Dew, Jason S, MD   1,000 mL at 10/26/23 0836   iodixanol  (VISIPAQUE ) 320 MG/ML injection    PRN Marea Selinda RAMAN, MD   65 mL at 10/26/23 0835   lidocaine -EPINEPHrine  (PF) (XYLOCAINE -EPINEPHrine ) 1 %-1:200000 (PF) injection    PRN Marea Selinda RAMAN, MD   10 mL at 10/26/23 0835     Physical Exam    VS:  BP (!) 158/72   Pulse 70   Ht 6'  (1.829 m)   Wt 220 lb (99.8 kg)   SpO2 98%   BMI 29.84 kg/m  , BMI Body mass index is 29.84 kg/m.    Vitals:  03/22/24 1425 03/22/24 1733  BP: (!) 150/70 (!) 158/72  Pulse: 70   SpO2: 98%           GEN: Well nourished, well developed, in no acute distress. HEENT: normal. Neck: Supple, no JVD, carotid bruits, or masses. Cardiac: RRR, no murmurs, rubs, or gallops. No clubbing, cyanosis, edema.  Radials 2+/PT 2+ and equal bilaterally.  Respiratory:  Respirations regular and unlabored, clear to auscultation bilaterally. GI: Soft, nontender, nondistended, BS + x 4. MS: no deformity or atrophy. Skin: warm and dry, no rash. Neuro:  Strength and sensation are intact. Psych: Normal affect.  Accessory Clinical Findings    ECG personally reviewed by me today - EKG Interpretation Date/Time:  Thursday March 22 2024 14:31:57 EST Ventricular Rate:  70 PR Interval:  228 QRS Duration:  168 QT Interval:  442 QTC Calculation: 477 R Axis:   -82  Text Interpretation: Sinus rhythm with 1st degree A-V block Left axis deviation Right bundle branch block Septal infarct , age undetermined Confirmed by Vivienne Bruckner [phone]) on 03/22/2024 2:43:10 PM  - no acute changes.  Lab Results  Component Value Date   WBC 5.6 01/03/2024   HGB 12.3 (L) 01/03/2024   HCT 36.8 (L) 01/03/2024   MCV 94.4 01/03/2024   PLT 129 (L) 01/03/2024   Lab Results  Component Value Date   CREATININE 1.30 (H) 01/03/2024   BUN 18 01/03/2024   NA 140 01/03/2024   K 4.4 01/03/2024   CL 105 01/03/2024   CO2 25 01/03/2024   Lab Results  Component Value Date   ALT 20 01/03/2024   AST 21 01/03/2024   ALKPHOS 58 01/03/2024   BILITOT 0.8 01/03/2024   Lab Results  Component Value Date   CHOL 69 04/07/2023   HDL 21 (L) 04/07/2023   LDLCALC 25 04/07/2023   LDLDIRECT 58 12/28/2017   TRIG 138 04/19/2023   CHOLHDL 3.3 04/07/2023    Lab Results  Component Value Date   HGBA1C 5.2 07/06/2023   Lab Results   Component Value Date   TSH 8.600 (H) 01/18/2024       Assessment & Plan    1.  Coronary artery disease: Status post CABG x 3 in 2019 with diagnostic catheterization in February 2025 in the setting of cardiac arrest and PE revealing known multivessel occlusive native disease with patent LIMA to LAD, patent vein graft to the proximal circumflex, and occluded vein graft to the RPDA.  Most recent echo in October 2025 showed an EF of 55 to 60% with grade 1 diastolic dysfunction and without significant valvular disease.  Patient has done well and remains reasonably active without symptoms or limitations.  He is not routinely exercising.  Continue aspirin , statin, and beta-blocker therapy.  2.  Chronic heart failure with improved ejection fraction/ischemic cardiomyopathy: EF previously as low as 35% at the time of his bypass surgery 2019 with subsequent improvement by echo in August 2024 with most recent echo in October 2025 showing EF 55 to 60% with normal wall motion and grade 1 diastolic dysfunction.  He is euvolemic on examination.  He is hypertensive today and I am increasing his Toprol -XL to 50 mg daily.  3.  Pulmonary embolism/PEA arrest: Hospitalized in February 2025 with cardiac arrest in the setting of pulseless left activity and asystole with subsequent finding of occlusive right upper lobe pulmonary embolism requiring thrombectomy.  Hospital course was complicated by respiratory failure and Pseudomonas pneumonia requiring eventual tracheostomy and subsequent discharge to LTAC.  There was a brief period of time where he was off of Eliquis  but this was resumed in June 2025.  He is followed by hematology/oncology with recommendation for at least 6 to 12 months of therapy.  Stable lab work in November 2025.  4.  Primary hypertension: Blood pressure is elevated today at 150/70 and remained elevated on repeat.  Increasing Toprol -XL to 50 mg daily.  He was previously on losartan  which was discontinued  in the setting of hypotension in November 2025.  May need to consider resuming.  5.  Tachycardia/bradycardia syndrome/complete heart block: History of atrial tachycardia as well as alternating bundle branch block with subsequent development of complete heart block and permanent pacemaker placement in November 2025.  6.  Carotid arterial disease: Previously evaluated with angiography in September 2025 revealing 30 to 40% right internal carotid artery stenosis.  Subsequent CTA of the head and neck suggested 55% right internal carotid artery stenosis, though this was felt to be over-called.  He is being medically managed and is followed by vascular surgery.  7.  GERD: This been well-managed on Pepcid  which patient is about to run out of and has requested a refill.  I refilled and encouraged him to follow-up with his primary care provider for future refills.  8.  Disposition: Follow-up in 3 months or sooner if necessary.  Lonni Meager, NP 03/22/2024, 5:34 PM     [1] No Known Allergies  "

## 2024-03-23 ENCOUNTER — Ambulatory Visit

## 2024-03-23 VITALS — BP 138/88 | Ht 72.0 in | Wt 224.6 lb

## 2024-03-23 DIAGNOSIS — Z0000 Encounter for general adult medical examination without abnormal findings: Secondary | ICD-10-CM

## 2024-03-23 NOTE — Patient Instructions (Addendum)
 Cory Hess,  Thank you for taking the time for your Medicare Wellness Visit. I appreciate your continued commitment to your health goals. Please review the care plan we discussed, and feel free to reach out if I can assist you further.  Please note that Annual Wellness Visits do not include a physical exam. Some assessments may be limited, especially if the visit was conducted virtually. If needed, we may recommend an in-person follow-up with your provider.  Ongoing Care Seeing your primary care provider every 3 to 6 months helps us  monitor your health and provide consistent, personalized care. 04/12/24 @ 1:30 APPT W/ KRISTINA BUTLER  Referrals If a referral was made during today's visit and you haven't received any updates within two weeks, please contact the referred provider directly to check on the status.  Recommended Screenings:  Health Maintenance  Topic Date Due   Medicare Annual Wellness Visit  Never done   COVID-19 Vaccine (1) Never done   Complete foot exam   Never done   Eye exam for diabetics  Never done   Kidney health urinalysis for diabetes  Never done   DTaP/Tdap/Td vaccine (1 - Tdap) Never done   Pneumococcal Vaccine for age over 4 (1 of 2 - PCV) Never done   Flu Shot  Never done   Hemoglobin A1C  01/06/2024   Hepatitis C Screening  09/12/2024*   Yearly kidney function blood test for diabetes  01/02/2025   Zoster (Shingles) Vaccine  Completed   Meningitis B Vaccine  Aged Out  *Topic was postponed. The date shown is not the original due date.     Vision: Annual vision screenings are recommended for early detection of glaucoma, cataracts, and diabetic retinopathy. These exams can also reveal signs of chronic conditions such as diabetes and high blood pressure.  Dental: Annual dental screenings help detect early signs of oral cancer, gum disease, and other conditions linked to overall health, including heart disease and diabetes.  Please see the attached documents  for additional preventive care recommendations.   NEXT AWV 04/19/25 @ 11:20 AM IN PERSON

## 2024-03-23 NOTE — Progress Notes (Signed)
 "  Chief Complaint  Patient presents with   Medicare Wellness     Subjective:   Cory Hess is a 79 y.o. male who presents for a Medicare Annual Wellness Visit.  Visit info / Clinical Intake: Medicare Wellness Visit Type:: Subsequent Annual Wellness Visit Persons participating in visit and providing information:: patient Medicare Wellness Visit Mode:: In-person (required for WTM) Interpreter Needed?: No Pre-visit prep was completed: yes AWV questionnaire completed by patient prior to visit?: no Living arrangements:: (!) lives alone Patient's Overall Health Status Rating: (!) fair Typical amount of pain: none Does pain affect daily life?: no Are you currently prescribed opioids?: no  Dietary Habits and Nutritional Risks How many meals a day?: 5 (has to eat soft stuff, shakes and the like) Eats fruit and vegetables daily?: (!) no Most meals are obtained by: preparing own meals; eating out In the last 2 weeks, have you had any of the following?: none Diabetic:: no  Functional Status Activities of Daily Living (to include ambulation/medication): Independent Ambulation: Independent Home Assistive Devices/Equipment: Cane Medication Administration: Independent Home Management (perform basic housework or laundry): Independent Manage your own finances?: yes Primary transportation is: driving Concerns about vision?: no *vision screening is required for WTM* (readers- Dr.Jackson) Concerns about hearing?: no  Fall Screening Falls in the past year?: 0 Number of falls in past year: 0 Was there an injury with Fall?: 0 Fall Risk Category Calculator: 0 Patient Fall Risk Level: Low Fall Risk  Fall Risk Patient at Risk for Falls Due to: No Fall Risks Fall risk Follow up: Falls evaluation completed; Falls prevention discussed  Home and Transportation Safety: All rugs have non-skid backing?: yes All stairs or steps have railings?: yes Have non-skid surface in bathtub or shower?:  yes Good home lighting?: yes Regular seat belt use?: yes Hospital stays in the last year:: (!) yes How many hospital stays:: 1 Reason: blood clots on lungs  Cognitive Assessment Difficulty concentrating, remembering, or making decisions? : no Will 6CIT or Mini Cog be Completed: yes What year is it?: 0 points What month is it?: 0 points Give patient an address phrase to remember (5 components): [address] [place], [place] About what time is it?: 0 points Count backwards from 20 to 1: 0 points Say the months of the year in reverse: 0 points Repeat the address phrase from earlier: 0 points 6 CIT Score: 0 points  Advance Directives (For Healthcare) Does Patient Have a Medical Advance Directive?: No Type of Advance Directive: Out of facility DNR (pink MOST or yellow form) Out of facility DNR (pink MOST or yellow form) in Chart? (Ambulatory ONLY): Yes - validated most recent copy scanned in chart Would patient like information on creating a medical advance directive?: No - Patient declined  Reviewed/Updated  Reviewed/Updated: Reviewed All (Medical, Surgical, Family, Medications, Allergies, Care Teams, Patient Goals)    Allergies (verified) Patient has no known allergies.   Current Medications (verified) Outpatient Encounter Medications as of 03/23/2024  Medication Sig   acetaminophen  (TYLENOL ) 500 MG tablet Take 1,000 mg by mouth in the morning and at bedtime.   apixaban  (ELIQUIS ) 5 MG TABS tablet Take 1 tablet (5 mg total) by mouth 2 (two) times daily.   aspirin  81 MG chewable tablet Place 1 tablet (81 mg total) into feeding tube daily.   atorvastatin  (LIPITOR ) 80 MG tablet Take 1 tablet (80 mg total) by mouth daily.   Blood Pressure Monitoring (BLOOD PRESSURE KIT) KIT 1 each by Does not apply  route in the morning, at noon, and at bedtime.   famotidine  (PEPCID ) 20 MG tablet Take 1 tablet (20 mg total) by mouth daily.   levothyroxine  (SYNTHROID ) 100 MCG tablet TAKE 1 TABLET BY  MOUTH ONCE DAILY BEFORE  BREAKFAST   metoprolol  succinate (TOPROL -XL) 50 MG 24 hr tablet Take 1 tablet (50 mg total) by mouth daily.   Multiple Vitamin (MULTIVITAMIN WITH MINERALS) TABS tablet Take 1 tablet by mouth daily.   oxyCODONE  (ROXICODONE ) 5 MG immediate release tablet Take 1 tablet (5 mg total) by mouth every 8 (eight) hours as needed. (Patient not taking: Reported on 03/23/2024)   Facility-Administered Encounter Medications as of 03/23/2024  Medication   Heparin  (Porcine) in NaCl 1000-0.9 UT/500ML-% SOLN   Heparin  (Porcine) in NaCl 2000-0.9 UNIT/L-% SOLN   iodixanol  (VISIPAQUE ) 320 MG/ML injection   lidocaine -EPINEPHrine  (PF) (XYLOCAINE -EPINEPHrine ) 1 %-1:200000 (PF) injection    History: Past Medical History:  Diagnosis Date   Abscess of right arm 03/2023   pt states had absess to right arm after iv was inserted. Pt states he had a wound vac for 2 weeks   Acute kidney injury 04/06/2023   Acute on chronic HFrEF (heart failure with reduced ejection fraction) (HCC) 04/05/2023   Acute respiratory failure with hypoxia and hypercapnia (HCC) 04/10/2023   Alternating RBBB & LBBB    Ankle swelling 2024   Anticoagulated on apixaban     Aortic atherosclerosis    Arthritis of neck    Bilateral inguinal hernia    Bladder tumor    BPH (benign prostatic hyperplasia)    CAD (coronary artery disease)    a. 08/2017 STEMI/Cath: LM 80ost/d, LAD 100p, LCX 95 ost/p, RCA 60p, 50m, RPDA 80ost; b. 08/2017 CABG x 3: LIMA->LAD, VG->OM, VG->PDA; c. 03/2023 Cardiac Arrest/Cath: Occluded VG->RPDA, patent LIMA->LAD and VG->pLCX. No new native dzs->Med rx. EF 25-35%.   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 10/2023 Carotid angio: RICA 30-40%-->med rx.   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 resulted in multiple episodes of PEA arrest --> stabilized with TCP --> TVP placed during LHC; b. 10/2023 CHB in setting of bb therapy; c. 12/2023 s/p BSX L331 Accolade MRI EL DC PPM.   CVA (cerebral vascular accident) (HCC) 04/08/2023   a.) brain MRI 04/08/2023: 2 small acute infarctions affecting the cortical brain in the RIGHT frontal region.   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 08/2017 TEE: EF 30-35%, sept/ant/inf HK, apical AK. 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 12/2017 Echo: EF 45-50%, diff HK; c. 09/2022 Echo: EF 55-60%; d. 03/2023 Echo (in settng of PE): EF 40-45%, GrI DD (imaging poor); e. 03/2023 Echo w/ definity : EF 55-60%, nl RV fxn; f. 11/2023 Echo: EF 55-60%, no rwma, mod conc LVH, GrI DD, nl RV fxn, trvi MR.   Hyperlipidemia LDL goal <70    Hypothyroidism    Ischemic cardiomyopathy    a. 08/2017 TEE: EF 30-35%; b. 12/2017 Echo: EF 45-50%; c. 09/2022 Echo: EF 55-60%; d. 11/2023 Echo: EF 55-60%.   Long-term  use of aspirin  therapy    Nephrolithiasis    PEA (Pulseless electrical activity) (HCC) 04/05/2023   a.) witnessed arrest at [facility] and CPR initiated immediately --> ROSC achieved --> EMS transferred to [facility] with multiple episodes of PEA arrest en route (further CPR required) --> ROSC achieved in ED with further muliple epsides of recurrent PEA arrest lasting secs-mins --> TCP applied and patient taken to cath lab for [facility] and TVP placement   Port-A-Cath in place    Pulmonary embolism (HCC) 04/15/2023   a.) CTA chest 04/15/2023: extensive clot burden with an occlusive clot in the RIGHT upper lobe anterior and posterior segmental arteries, and a non occlusive thrombus in the RIGHT lower lobe main artery and superior and posterior basal segmental arteries --> required thrombectomy   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  LIMA-LAD, SVG-OM, SVG-PDA   Shingles 2018   Status post bilateral cataract extraction    STEMI (ST elevation myocardial infarction) (HCC) 08/27/2017   a.) LHC 08/27/2017: 80% o-dLM, 100% lPAD, 95% o-pLCx, 60% pRCA, 90% mRCA, 80% oRPDA-RPDA --> unable to cross lesions with wire --> IABP placed --> CVTS consulted and patient transferred to [facility]; b.) s/p 3v CABG 08/27/2017: LIMA-LAD, SVG-OM, SVG-RPDA   Syncope 12/31/2023   Tachycardia-bradycardia syndrome (HCC)    a. 12/2023 s/p BSX L331 Accolade MRI EL DC PPM.   Thrombocytopenia    Type 2 diabetes mellitus without complications (HCC) 06/07/2023   Vertigo    Past Surgical History:  Procedure Laterality Date   CARDIAC CATHETERIZATION     CAROTID PTA/STENT INTERVENTION Right 10/26/2023   Carotid angio only - no stent placed   CATARACT EXTRACTION W/PHACO Left 07/18/2018   Procedure: CATARACT EXTRACTION PHACO AND INTRAOCULAR LENS PLACEMENT (IOC)  LEFT;  Surgeon: Myrna Adine Anes, MD;  Location: [facility];  Service: Ophthalmology;  Laterality: Left;   CATARACT EXTRACTION W/PHACO Right 08/14/2018   Procedure: CATARACT EXTRACTION PHACO AND INTRAOCULAR LENS PLACEMENT (IOC)  RIGHT;  Surgeon: Myrna Adine Anes, MD;  Location: [facility];  Service: Ophthalmology;  Laterality: Right;   CORONARY ARTERY BYPASS GRAFT N/A 08/27/2017   Procedure: CORONARY ARTERY BYPASS GRAFTING (CABG) ON PUMP USING LEFT INTERNAL MAMMARY ARTERY AND LEFT GREATER SAPHENOUS VEIN VIA ENDOVEIN HARVEST;  Surgeon: Lucas Dorise POUR, MD;  Location: [facility] OR;  Service: Open Heart Surgery;  Laterality: N/A;   CORONARY/GRAFT ACUTE MI REVASCULARIZATION N/A 08/27/2017   Procedure: Coronary/Graft Acute MI Revascularization;  Surgeon: Darron Deatrice LABOR, MD;  Location: [facility] IN[facility];  Service: Cardiovascular;  Laterality: N/A;   CYSTOSCOPY W/ RETROGRADES Bilateral 12/25/2018   Procedure: CYSTOSCOPY WITH RETROGRADE PYELOGRAM;  Surgeon: Penne Knee, MD;   Location: [facility] OR[facility];  Service: Urology;  Laterality: Bilateral;   FRACTURE SURGERY Right 1958   arm and left wrist compound fracture, no metal   HERNIA REPAIR Right    inguinial   INGUINAL HERNIA REPAIR Left 12/05/2023   Procedure: REPAIR, HERNIA, INGUINAL, ADULT;  Surgeon: Marinda Jayson KIDD, MD;  Location: [facility] OR[facility];  Service: General;  Laterality: Left;  open procedure with mesh   INSERTION OF MESH  12/05/2023   Procedure: INSERTION OF MESH;  Surgeon: Marinda Jayson KIDD, MD;  Location: [facility] OR[facility];  Service: General;;   IR IMAGING GUIDED PORT INSERTION  10/15/2022   LEFT HEART CATH AND CORONARY ANGIOGRAPHY N/A 08/27/2017   Procedure: LEFT HEART CATH AND CORONARY ANGIOGRAPHY;  Surgeon: Darron Deatrice LABOR, MD;  Location: [facility];  Service: Cardiovascular;  Laterality: N/A;   PACEMAKER IMPLANT N/A 01/03/2024   Procedure: PACEMAKER IMPLANT;  Surgeon: Cindie Ole DASEN, MD;  Location: [facility];  Service: Cardiovascular;  Laterality: N/A;   PULMONARY THROMBECTOMY Bilateral 04/18/2023   Procedure: PULMONARY THROMBECTOMY;  Surgeon: Marea Selinda RAMAN, MD;  Location: [facility];  Service: Cardiovascular;  Laterality: Bilateral;   RIGHT/LEFT HEART CATH AND CORONARY/GRAFT ANGIOGRAPHY N/A 04/05/2023   Procedure: RIGHT/LEFT HEART CATH AND CORONARY/GRAFT ANGIOGRAPHY;  Surgeon: Mady Bruckner, MD;  Location: [facility];  Service: Cardiovascular;  Laterality: N/A;   TEMPORARY PACEMAKER N/A 04/05/2023   Procedure: TEMPORARY PACEMAKER;  Surgeon: Mady Bruckner, MD;  Location: [facility];  Service: Cardiovascular;  Laterality: N/A;   TEMPORARY PACEMAKER N/A 12/10/2023   Procedure: TEMPORARY PACEMAKER;  Surgeon: Florencio Cara BIRCH, MD;  Location: [facility];  Service: Cardiovascular;  Laterality: N/A;   TRANSURETHRAL RESECTION OF BLADDER TUMOR WITH MITOMYCIN -C N/A 12/25/2018   Procedure: TRANSURETHRAL RESECTION OF BLADDER TUMOR WITH Gemcitabine ;  Surgeon:  Penne Knee, MD;  Location: [facility] OR[facility];  Service: Urology;  Laterality: N/A;   Family History  Problem Relation Age of Onset   Heart failure Mother    Lung disease Father    Stroke Father    Cervical cancer Maternal Grandmother    Social History   Occupational History   Occupation: supervision, research scientist (life sciences)    Comment: retired  Tobacco Use   Smoking status: Former    Current packs/day: 0.00    Types: Cigarettes    Quit date: 2000    Years since quitting: 26.0    Passive exposure: Past   Smokeless tobacco: Former    Types: Snuff  Vaping Use   Vaping status: Never Used  Substance and Sexual Activity   Alcohol use: Not Currently   Drug use: Not Currently   Sexual activity: Not Currently   Tobacco Counseling Counseling given: Not Answered  SDOH Screenings   Food Insecurity: No Food Insecurity (03/23/2024)  Housing: Unknown (03/23/2024)  Transportation Needs: No Transportation Needs (03/23/2024)  Utilities: Not At Risk (03/23/2024)  Depression (PHQ2-9): Low Risk (03/23/2024)  Physical Activity: Insufficiently Active (03/23/2024)  Social Connections: Moderately Isolated (03/23/2024)  Stress: No Stress Concern Present (03/23/2024)  Tobacco Use: Medium Risk (03/23/2024)  Health Literacy: Adequate Health Literacy (03/23/2024)   See flowsheets for full screening details  Depression Screen PHQ 2 & 9 Depression Scale- Over the past 2 weeks, how often have you been bothered by any of the following problems? Little interest or pleasure in doing things: 0 Feeling down, depressed, or hopeless (PHQ Adolescent also includes...irritable): 0 PHQ-2 Total Score: 0 Trouble falling or staying asleep, or sleeping too much: 0 Feeling tired or having little energy: 0 Poor appetite or overeating (PHQ Adolescent also includes...weight loss): 0 Feeling bad about yourself - or that you are a failure or have let yourself or your family down: 0 Trouble concentrating on things, such as  reading the newspaper or watching television (PHQ Adolescent also includes...like school work): 0 Moving or speaking so slowly that other people could have noticed. Or the opposite - being so fidgety or restless that you have been moving around a lot more than usual: 0 Thoughts that you would be better off dead, or of hurting yourself in some way: 0 PHQ-9 Total Score: 0 If you checked off any problems, how difficult have these problems made it for you to do your work, take care of things at home, or get  along with other people?: Not difficult at all  Depression Treatment Depression Interventions/Treatment : [id number] Score <4 Follow-up Not Indicated     Goals Addressed             This Visit's Progress    DIET - EAT MORE FRUITS AND VEGETABLES               Objective:    Today's Vitals   03/23/24 1308  BP: 138/88  Weight: 224 lb 9.6 oz (101.9 kg)  Height: 6' (1.829 m)   Body mass index is 30.46 kg/m.  Hearing/Vision screen No results found. Immunizations and Health Maintenance Health Maintenance  Topic Date Due   COVID-19 Vaccine (1) Never done   FOOT EXAM  Never done   OPHTHALMOLOGY EXAM  Never done   Diabetic kidney evaluation - Urine ACR  Never done   DTaP/Tdap/Td (1 - Tdap) Never done   Pneumococcal Vaccine: 50+ Years (1 of 2 - PCV) Never done   Influenza Vaccine  Never done   HEMOGLOBIN A1C  01/06/2024   Hepatitis C Screening  09/12/2024 (Originally 09/12/1963)   Diabetic kidney evaluation - eGFR measurement  01/02/2025   Medicare Annual Wellness (AWV)  03/23/2025   Zoster Vaccines- Shingrix  Completed   Meningococcal B Vaccine  Aged Out        Assessment/Plan:  This is a routine wellness examination for Cory Hess.  Patient Care Team: Towana Small, FNP as PCP - General (Family Medicine) End, Lonni, MD as PCP - Cardiology (Cardiology) Vivienne Lonni Ingle, NP as Nurse Practitioner (Cardiology) Leonce Longs, OD [facility])  I have  personally reviewed and noted the following in the patients chart:   Medical and social history Use of alcohol, tobacco or illicit drugs  Current medications and supplements including opioid prescriptions. Functional ability and status Nutritional status Physical activity Advanced directives List of other physicians Hospitalizations, surgeries, and ER visits in previous 12 months Vitals Screenings to include cognitive, depression, and falls Referrals and appointments  No orders of the defined types were placed in this encounter.  In addition, I have reviewed and discussed with patient certain preventive protocols, quality metrics, and best practice recommendations. A written personalized care plan for preventive services as well as general preventive health recommendations were provided to patient.   Cory GORMAN Das, LPN   [id number]/[id number]   Return in about 1 year (around 03/23/2025).  After Visit Summary: (In Person-Printed) AVS printed and given to the patient  Nurse Notes: DECLINES ALL VACCINES; AGED OUT OF COLONOSCOPY   "

## 2024-03-29 ENCOUNTER — Encounter: Payer: Self-pay | Admitting: General Surgery

## 2024-03-29 ENCOUNTER — Ambulatory Visit: Admitting: General Surgery

## 2024-03-29 VITALS — BP 157/84 | HR 73 | Ht 72.0 in | Wt 226.0 lb

## 2024-03-29 DIAGNOSIS — R1032 Left lower quadrant pain: Secondary | ICD-10-CM | POA: Diagnosis not present

## 2024-03-29 DIAGNOSIS — K4090 Unilateral inguinal hernia, without obstruction or gangrene, not specified as recurrent: Secondary | ICD-10-CM

## 2024-03-29 NOTE — Progress Notes (Signed)
 Outpatient Surgical Follow Up  03/29/2024  Cory Hess is an 79 y.o. male.   Chief Complaint  Patient presents with   Follow-up    HPI: Patient is today status post left inguinal hernia repair.  He reports that he still has what he feels like a bulge in his left groin.  He says that the swelling of the area has decreased significantly.  He did have a postoperative hematoma.  He says that he has pain over both groins that wraps around to the lower back.  He also reports pain in his upper abdomen where he had a previous PEG tube placed.  He denies any overlying skin changes and says that the incision in his left groin has healed well.  Past Medical History:  Diagnosis Date   Abscess of right arm 03/2023   pt states had absess to right arm after iv was inserted. Pt states he had a wound vac for 2 weeks   Acute kidney injury 04/06/2023   Acute on chronic HFrEF (heart failure with reduced ejection fraction) (HCC) 04/05/2023   Acute respiratory failure with hypoxia and hypercapnia (HCC) 04/10/2023   Alternating RBBB & LBBB    Ankle swelling 2024   Anticoagulated on apixaban     Aortic atherosclerosis    Arthritis of neck    Bilateral inguinal hernia    Bladder tumor    BPH (benign prostatic hyperplasia)    CAD (coronary artery disease)    a. 08/2017 STEMI/Cath: LM 80ost/d, LAD 100p, LCX 95 ost/p, RCA 60p, 19m, RPDA 80ost; b. 08/2017 CABG x 3: LIMA->LAD, VG->OM, VG->PDA; c. 03/2023 Cardiac Arrest/Cath: Occluded VG->RPDA, patent LIMA->LAD and VG->pLCX. No new native dzs->Med rx. EF 25-35%.   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 10/2023 Carotid angio: RICA 30-40%-->med rx.   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 resulted in  multiple episodes of PEA arrest --> stabilized with TCP --> TVP placed during LHC; b. 10/2023 CHB in setting of bb therapy; c. 12/2023 s/p BSX L331 Accolade MRI EL DC PPM.   CVA (cerebral vascular accident) (HCC) 04/08/2023   a.) brain MRI 04/08/2023: 2 small acute infarctions affecting the cortical brain in the RIGHT frontal region.   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 08/2017 TEE: EF 30-35%, sept/ant/inf HK, apical AK. 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 12/2017 Echo: EF 45-50%, diff HK; c. 09/2022 Echo: EF 55-60%; d. 03/2023 Echo (in settng of PE): EF 40-45%, GrI DD (imaging poor); e. 03/2023 Echo w/ definity : EF 55-60%, nl RV fxn; f. 11/2023 Echo: EF 55-60%, no rwma, mod conc LVH, GrI DD, nl RV fxn, trvi MR.   Hyperlipidemia LDL goal <70    Hypothyroidism    Ischemic cardiomyopathy    a. 08/2017 TEE: EF 30-35%; b. 12/2017 Echo: EF 45-50%; c. 09/2022 Echo: EF 55-60%; d. 11/2023 Echo: EF 55-60%.   Long-term use of aspirin  therapy    Nephrolithiasis    PEA (Pulseless electrical activity) (HCC) 04/05/2023   a.) witnessed arrest at [facility]  and CPR initiated immediately --> ROSC achieved --> EMS transferred to [facility] with multiple episodes of PEA arrest en route (further CPR required) --> ROSC achieved in ED with further muliple epsides of recurrent PEA arrest lasting secs-mins --> TCP applied and patient taken to cath lab for [facility] and TVP placement   Port-A-Cath in place    Pulmonary embolism (HCC) 04/15/2023   a.) CTA chest 04/15/2023: extensive clot burden with an occlusive clot in the RIGHT upper lobe anterior and posterior segmental arteries, and a non occlusive thrombus in the RIGHT lower lobe main artery and superior and posterior basal segmental arteries --> required thrombectomy   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 LIMA-LAD, SVG-OM, SVG-PDA   Shingles 2018   Status post  bilateral cataract extraction    STEMI (ST elevation myocardial infarction) (HCC) 08/27/2017   a.) LHC 08/27/2017: 80% o-dLM, 100% lPAD, 95% o-pLCx, 60% pRCA, 90% mRCA, 80% oRPDA-RPDA --> unable to cross lesions with wire --> IABP placed --> CVTS consulted and patient transferred to [facility]; b.) s/p 3v CABG 08/27/2017: LIMA-LAD, SVG-OM, SVG-RPDA   Syncope 12/31/2023   Tachycardia-bradycardia syndrome (HCC)    a. 12/2023 s/p BSX L331 Accolade MRI EL DC PPM.   Thrombocytopenia    Type 2 diabetes mellitus without complications (HCC) 06/07/2023   Vertigo     Past Surgical History:  Procedure Laterality Date   CARDIAC CATHETERIZATION     CAROTID PTA/STENT INTERVENTION Right 10/26/2023   Carotid angio only - no stent placed   CATARACT EXTRACTION W/PHACO Left 07/18/2018   Procedure: CATARACT EXTRACTION PHACO AND INTRAOCULAR LENS PLACEMENT (IOC)  LEFT;  Surgeon: Myrna Adine Anes, MD;  Location: [facility];  Service: Ophthalmology;  Laterality: Left;   CATARACT EXTRACTION W/PHACO Right 08/14/2018   Procedure: CATARACT EXTRACTION PHACO AND INTRAOCULAR LENS PLACEMENT (IOC)  RIGHT;  Surgeon: Myrna Adine Anes, MD;  Location: [facility];  Service: Ophthalmology;  Laterality: Right;   CORONARY ARTERY BYPASS GRAFT N/A 08/27/2017   Procedure: CORONARY ARTERY BYPASS GRAFTING (CABG) ON PUMP USING LEFT INTERNAL MAMMARY ARTERY AND LEFT GREATER SAPHENOUS VEIN VIA ENDOVEIN HARVEST;  Surgeon: Lucas Dorise POUR, MD;  Location: [facility] OR;  Service: Open Heart Surgery;  Laterality: N/A;   CORONARY/GRAFT ACUTE MI REVASCULARIZATION N/A 08/27/2017   Procedure: Coronary/Graft Acute MI Revascularization;  Surgeon: Darron Deatrice LABOR, MD;  Location: [facility] INVASIVE CV [facility];  Service: Cardiovascular;  Laterality: N/A;   CYSTOSCOPY W/ RETROGRADES Bilateral 12/25/2018   Procedure: CYSTOSCOPY WITH RETROGRADE PYELOGRAM;  Surgeon: Penne Knee, MD;  Location: [facility] ORS;  Service: Urology;  Laterality: Bilateral;    FRACTURE SURGERY Right 1958   arm and left wrist compound fracture, no metal   HERNIA REPAIR Right    inguinial   INGUINAL HERNIA REPAIR Left 12/05/2023   Procedure: REPAIR, HERNIA, INGUINAL, ADULT;  Surgeon: Marinda Jayson KIDD, MD;  Location: [facility] OR[facility];  Service: General;  Laterality: Left;  open procedure with mesh   INSERTION OF MESH  12/05/2023   Procedure: INSERTION OF MESH;  Surgeon: Marinda Jayson KIDD, MD;  Location: [facility] OR[facility];  Service: General;;   IR IMAGING GUIDED PORT INSERTION  10/15/2022   LEFT HEART CATH AND CORONARY ANGIOGRAPHY N/A 08/27/2017   Procedure: LEFT HEART CATH AND CORONARY ANGIOGRAPHY;  Surgeon: Darron Deatrice LABOR, MD;  Location: [facility];  Service: Cardiovascular;  Laterality: N/A;   PACEMAKER IMPLANT N/A 01/03/2024   Procedure: PACEMAKER IMPLANT;  Surgeon: Cindie Ole DASEN, MD;  Location: [facility] CV  [facility];  Service: Cardiovascular;  Laterality: N/A;   PULMONARY THROMBECTOMY Bilateral 04/18/2023   Procedure: PULMONARY THROMBECTOMY;  Surgeon: Marea Selinda RAMAN, MD;  Location: [facility];  Service: Cardiovascular;  Laterality: Bilateral;   RIGHT/LEFT HEART CATH AND CORONARY/GRAFT ANGIOGRAPHY N/A 04/05/2023   Procedure: RIGHT/LEFT HEART CATH AND CORONARY/GRAFT ANGIOGRAPHY;  Surgeon: Mady Bruckner, MD;  Location: [facility];  Service: Cardiovascular;  Laterality: N/A;   TEMPORARY PACEMAKER N/A 04/05/2023   Procedure: TEMPORARY PACEMAKER;  Surgeon: Mady Bruckner, MD;  Location: [facility];  Service: Cardiovascular;  Laterality: N/A;   TEMPORARY PACEMAKER N/A 12/10/2023   Procedure: TEMPORARY PACEMAKER;  Surgeon: Florencio Cara BIRCH, MD;  Location: [facility];  Service: Cardiovascular;  Laterality: N/A;   TRANSURETHRAL RESECTION OF BLADDER TUMOR WITH MITOMYCIN -C N/A 12/25/2018   Procedure: TRANSURETHRAL RESECTION OF BLADDER TUMOR WITH Gemcitabine ;  Surgeon: Penne Knee, MD;  Location: [facility] OR[facility];  Service: Urology;   Laterality: N/A;    Family History  Problem Relation Age of Onset   Heart failure Mother    Lung disease Father    Stroke Father    Cervical cancer Maternal Grandmother     Social History:  reports that he quit smoking about 26 years ago. His smoking use included cigarettes. He has been exposed to tobacco smoke. He has quit using smokeless tobacco.  His smokeless tobacco use included snuff. He reports that he does not currently use alcohol. He reports that he does not currently use drugs.  Allergies: Allergies[1]  Medications reviewed.    ROS Full ROS performed and is otherwise negative other than what is stated in HPI   BP (!) 157/84   Pulse 73   Ht 6' (1.829 m)   Wt 226 lb (102.5 kg)   SpO2 98%   BMI 30.65 kg/m   Physical Exam  Right groin without any evidence of recurrence of previous hernia repair, left groin incision has healed well there is still little bit of fullness along the length of the cord but I do not feel a bulge on Valsalva, there is a little bit of swelling on the left side compared to the right side.  In his upper abdomen the area where his previous PEG tube was has healed and well and there is no evidence of upper abdominal hernia.   No results found for this or any previous visit (from the past 48 hours). No results found.  Assessment/Plan:  1. Left inguinal hernia (Primary) Given patient's persistent symptoms we will get a repeat CT scan to see if there is any evidence of recurrence. - CT ABDOMEN PELVIS W CONTRAST; Future  2. Left inguinal pain - CT ABDOMEN PELVIS W CONTRAST; Future    Jayson Endow, M.D. [facility]     [1] No Known Allergies

## 2024-03-29 NOTE — Patient Instructions (Signed)
 We have scheduled you for a CT Scan of your Abdomen and Pelvis with contrast. [facility] . Please arrive there by 3:15pm . If you need to reschedule your Scan, you may do so by calling [phone]. Please let us  know if you reschedule your scan as we have to get authorization from your insurance for this.

## 2024-04-04 ENCOUNTER — Ambulatory Visit

## 2024-04-11 ENCOUNTER — Ambulatory Visit: Admitting: Cardiology

## 2024-04-12 ENCOUNTER — Ambulatory Visit: Admitting: Family Medicine

## 2024-04-17 ENCOUNTER — Ambulatory Visit: Admitting: General Surgery

## 2024-04-24 ENCOUNTER — Encounter (INDEPENDENT_AMBULATORY_CARE_PROVIDER_SITE_OTHER)

## 2024-04-24 ENCOUNTER — Ambulatory Visit (INDEPENDENT_AMBULATORY_CARE_PROVIDER_SITE_OTHER): Admitting: Vascular Surgery

## 2024-05-15 ENCOUNTER — Ambulatory Visit

## 2024-08-14 ENCOUNTER — Ambulatory Visit

## 2024-11-13 ENCOUNTER — Ambulatory Visit

## 2025-02-12 ENCOUNTER — Ambulatory Visit

## 2025-04-19 ENCOUNTER — Ambulatory Visit

## 2025-05-14 ENCOUNTER — Ambulatory Visit
